# Patient Record
Sex: Female | Born: 1955
Health system: Southern US, Community
[De-identification: ages and names within clinical notes are randomized; demographics above are authoritative.]

## PROBLEM LIST (undated history)

## (undated) DIAGNOSIS — I10 Essential (primary) hypertension: Secondary | ICD-10-CM

## (undated) DIAGNOSIS — E119 Type 2 diabetes mellitus without complications: Secondary | ICD-10-CM

## (undated) DIAGNOSIS — C3432 Malignant neoplasm of lower lobe, left bronchus or lung: Secondary | ICD-10-CM

## (undated) DIAGNOSIS — F32A Depression, unspecified: Secondary | ICD-10-CM

## (undated) DIAGNOSIS — Z7902 Long term (current) use of antithrombotics/antiplatelets: Secondary | ICD-10-CM

## (undated) DIAGNOSIS — F329 Major depressive disorder, single episode, unspecified: Secondary | ICD-10-CM

## (undated) DIAGNOSIS — R0600 Dyspnea, unspecified: Secondary | ICD-10-CM

## (undated) DIAGNOSIS — J45909 Unspecified asthma, uncomplicated: Secondary | ICD-10-CM

## (undated) DIAGNOSIS — T8859XA Other complications of anesthesia, initial encounter: Secondary | ICD-10-CM

## (undated) DIAGNOSIS — M4802 Spinal stenosis, cervical region: Secondary | ICD-10-CM

## (undated) DIAGNOSIS — K760 Fatty (change of) liver, not elsewhere classified: Secondary | ICD-10-CM

## (undated) DIAGNOSIS — E785 Hyperlipidemia, unspecified: Secondary | ICD-10-CM

## (undated) DIAGNOSIS — M4722 Other spondylosis with radiculopathy, cervical region: Secondary | ICD-10-CM

## (undated) DIAGNOSIS — M48061 Spinal stenosis, lumbar region without neurogenic claudication: Secondary | ICD-10-CM

## (undated) DIAGNOSIS — K635 Polyp of colon: Secondary | ICD-10-CM

## (undated) DIAGNOSIS — Z9841 Cataract extraction status, right eye: Secondary | ICD-10-CM

## (undated) DIAGNOSIS — F419 Anxiety disorder, unspecified: Secondary | ICD-10-CM

## (undated) DIAGNOSIS — C7970 Secondary malignant neoplasm of unspecified adrenal gland: Secondary | ICD-10-CM

## (undated) DIAGNOSIS — I2510 Atherosclerotic heart disease of native coronary artery without angina pectoris: Secondary | ICD-10-CM

## (undated) DIAGNOSIS — I714 Abdominal aortic aneurysm, without rupture, unspecified: Secondary | ICD-10-CM

## (undated) DIAGNOSIS — J449 Chronic obstructive pulmonary disease, unspecified: Secondary | ICD-10-CM

## (undated) DIAGNOSIS — E538 Deficiency of other specified B group vitamins: Secondary | ICD-10-CM

## (undated) DIAGNOSIS — K219 Gastro-esophageal reflux disease without esophagitis: Secondary | ICD-10-CM

## (undated) DIAGNOSIS — M5135 Other intervertebral disc degeneration, thoracolumbar region: Secondary | ICD-10-CM

## (undated) DIAGNOSIS — Z7982 Long term (current) use of aspirin: Secondary | ICD-10-CM

## (undated) DIAGNOSIS — M5030 Other cervical disc degeneration, unspecified cervical region: Secondary | ICD-10-CM

## (undated) DIAGNOSIS — G8929 Other chronic pain: Secondary | ICD-10-CM

## (undated) DIAGNOSIS — E559 Vitamin D deficiency, unspecified: Secondary | ICD-10-CM

## (undated) DIAGNOSIS — N83299 Other ovarian cyst, unspecified side: Secondary | ICD-10-CM

## (undated) DIAGNOSIS — J439 Emphysema, unspecified: Secondary | ICD-10-CM

## (undated) DIAGNOSIS — M1990 Unspecified osteoarthritis, unspecified site: Secondary | ICD-10-CM

## (undated) DIAGNOSIS — C3490 Malignant neoplasm of unspecified part of unspecified bronchus or lung: Secondary | ICD-10-CM

## (undated) DIAGNOSIS — J432 Centrilobular emphysema: Secondary | ICD-10-CM

## (undated) DIAGNOSIS — E7800 Pure hypercholesterolemia, unspecified: Secondary | ICD-10-CM

## (undated) DIAGNOSIS — G4730 Sleep apnea, unspecified: Secondary | ICD-10-CM

## (undated) DIAGNOSIS — Z87442 Personal history of urinary calculi: Secondary | ICD-10-CM

## (undated) DIAGNOSIS — M47816 Spondylosis without myelopathy or radiculopathy, lumbar region: Secondary | ICD-10-CM

## (undated) DIAGNOSIS — I70213 Atherosclerosis of native arteries of extremities with intermittent claudication, bilateral legs: Secondary | ICD-10-CM

## (undated) DIAGNOSIS — Z9842 Cataract extraction status, left eye: Secondary | ICD-10-CM

## (undated) DIAGNOSIS — Z716 Tobacco abuse counseling: Secondary | ICD-10-CM

## (undated) DIAGNOSIS — C7492 Malignant neoplasm of unspecified part of left adrenal gland: Secondary | ICD-10-CM

## (undated) DIAGNOSIS — M810 Age-related osteoporosis without current pathological fracture: Secondary | ICD-10-CM

## (undated) DIAGNOSIS — J302 Other seasonal allergic rhinitis: Secondary | ICD-10-CM

## (undated) DIAGNOSIS — I6523 Occlusion and stenosis of bilateral carotid arteries: Secondary | ICD-10-CM

## (undated) DIAGNOSIS — M109 Gout, unspecified: Secondary | ICD-10-CM

## (undated) DIAGNOSIS — C801 Malignant (primary) neoplasm, unspecified: Secondary | ICD-10-CM

## (undated) DIAGNOSIS — I700 Atherosclerosis of aorta: Secondary | ICD-10-CM

## (undated) DIAGNOSIS — N9489 Other specified conditions associated with female genital organs and menstrual cycle: Secondary | ICD-10-CM

## (undated) DIAGNOSIS — T7840XA Allergy, unspecified, initial encounter: Secondary | ICD-10-CM

## (undated) HISTORY — DX: Major depressive disorder, single episode, unspecified: F32.9

## (undated) HISTORY — DX: Gastro-esophageal reflux disease without esophagitis: K21.9

## (undated) HISTORY — DX: Unspecified asthma, uncomplicated: J45.909

## (undated) HISTORY — PX: ABDOMINAL HYSTERECTOMY: SHX81

## (undated) HISTORY — DX: Depression, unspecified: F32.A

## (undated) HISTORY — DX: Hyperlipidemia, unspecified: E78.5

## (undated) HISTORY — PX: CARDIAC CATHETERIZATION: SHX172

## (undated) HISTORY — PX: VAGINAL HYSTERECTOMY: SUR661

## (undated) HISTORY — PX: CORONARY ANGIOPLASTY: SHX604

## (undated) HISTORY — PX: EXTRACORPOREAL SHOCK WAVE LITHOTRIPSY: SHX1557

## (undated) HISTORY — DX: Emphysema, unspecified: J43.9

## (undated) HISTORY — PX: OTHER SURGICAL HISTORY: SHX169

## (undated) HISTORY — DX: Type 2 diabetes mellitus without complications: E11.9

## (undated) HISTORY — PX: TUBAL LIGATION: SHX77

## (undated) HISTORY — DX: Allergy, unspecified, initial encounter: T78.40XA

## (undated) HISTORY — DX: Tobacco abuse counseling: Z71.6

## (undated) HISTORY — DX: Age-related osteoporosis without current pathological fracture: M81.0

## (undated) HISTORY — DX: Essential (primary) hypertension: I10

## (undated) HISTORY — DX: Unspecified osteoarthritis, unspecified site: M19.90

## (undated) SURGERY — Surgical Case
Anesthesia: *Unknown

---

## 1997-12-04 HISTORY — PX: KIDNEY STONE SURGERY: SHX686

## 1997-12-04 HISTORY — PX: EXTRACORPOREAL SHOCK WAVE LITHOTRIPSY: SHX1557

## 2006-08-25 ENCOUNTER — Emergency Department: Payer: Self-pay

## 2007-03-02 ENCOUNTER — Other Ambulatory Visit: Payer: Self-pay

## 2007-03-02 ENCOUNTER — Emergency Department: Payer: Self-pay | Admitting: General Practice

## 2007-10-09 ENCOUNTER — Emergency Department: Payer: Self-pay | Admitting: Internal Medicine

## 2007-10-25 DIAGNOSIS — J309 Allergic rhinitis, unspecified: Secondary | ICD-10-CM | POA: Insufficient documentation

## 2007-10-25 DIAGNOSIS — I10 Essential (primary) hypertension: Secondary | ICD-10-CM | POA: Insufficient documentation

## 2007-10-25 DIAGNOSIS — F17210 Nicotine dependence, cigarettes, uncomplicated: Secondary | ICD-10-CM | POA: Insufficient documentation

## 2007-10-25 DIAGNOSIS — M1990 Unspecified osteoarthritis, unspecified site: Secondary | ICD-10-CM | POA: Insufficient documentation

## 2007-10-25 DIAGNOSIS — J45909 Unspecified asthma, uncomplicated: Secondary | ICD-10-CM | POA: Insufficient documentation

## 2007-10-25 DIAGNOSIS — M9979 Connective tissue and disc stenosis of intervertebral foramina of abdomen and other regions: Secondary | ICD-10-CM | POA: Insufficient documentation

## 2007-12-25 ENCOUNTER — Ambulatory Visit: Payer: Self-pay | Admitting: Family Medicine

## 2008-01-15 ENCOUNTER — Ambulatory Visit: Payer: Self-pay | Admitting: Family Medicine

## 2008-03-23 ENCOUNTER — Emergency Department: Payer: Self-pay | Admitting: Emergency Medicine

## 2008-04-01 ENCOUNTER — Ambulatory Visit: Payer: Self-pay | Admitting: Family Medicine

## 2008-06-24 DIAGNOSIS — E7800 Pure hypercholesterolemia, unspecified: Secondary | ICD-10-CM | POA: Insufficient documentation

## 2008-06-27 ENCOUNTER — Other Ambulatory Visit: Payer: Self-pay

## 2008-06-27 ENCOUNTER — Emergency Department: Payer: Self-pay | Admitting: Emergency Medicine

## 2009-02-01 HISTORY — PX: OTHER SURGICAL HISTORY: SHX169

## 2009-02-01 HISTORY — PX: CORONARY ANGIOPLASTY: SHX604

## 2009-02-01 HISTORY — PX: CARDIAC CATHETERIZATION: SHX172

## 2009-02-05 DIAGNOSIS — E119 Type 2 diabetes mellitus without complications: Secondary | ICD-10-CM | POA: Insufficient documentation

## 2009-03-16 ENCOUNTER — Ambulatory Visit: Payer: Self-pay | Admitting: Cardiology

## 2009-03-16 DIAGNOSIS — I2510 Atherosclerotic heart disease of native coronary artery without angina pectoris: Secondary | ICD-10-CM

## 2009-03-16 HISTORY — DX: Atherosclerotic heart disease of native coronary artery without angina pectoris: I25.10

## 2009-03-16 HISTORY — PX: CORONARY ANGIOPLASTY WITH STENT PLACEMENT: SHX49

## 2009-03-17 DIAGNOSIS — I2510 Atherosclerotic heart disease of native coronary artery without angina pectoris: Secondary | ICD-10-CM | POA: Insufficient documentation

## 2009-05-07 ENCOUNTER — Ambulatory Visit: Payer: Self-pay | Admitting: Family Medicine

## 2010-01-25 ENCOUNTER — Ambulatory Visit: Payer: Self-pay | Admitting: Family Medicine

## 2011-03-28 HISTORY — PX: OTHER SURGICAL HISTORY: SHX169

## 2011-04-20 ENCOUNTER — Ambulatory Visit: Payer: Self-pay | Admitting: Family Medicine

## 2011-04-27 ENCOUNTER — Ambulatory Visit: Payer: Self-pay | Admitting: Family Medicine

## 2011-11-01 ENCOUNTER — Ambulatory Visit: Payer: Self-pay | Admitting: Family Medicine

## 2011-12-14 ENCOUNTER — Ambulatory Visit: Payer: Self-pay | Admitting: Family Medicine

## 2011-12-14 LAB — HM MAMMOGRAPHY

## 2012-03-14 ENCOUNTER — Ambulatory Visit: Payer: Self-pay | Admitting: Family Medicine

## 2012-03-14 LAB — HM PAP SMEAR: HM Pap smear: NEGATIVE

## 2012-08-26 ENCOUNTER — Emergency Department: Payer: Self-pay | Admitting: *Deleted

## 2012-08-26 LAB — URINALYSIS, COMPLETE
Bacteria: NONE SEEN
Bilirubin,UR: NEGATIVE
Blood: NEGATIVE
Glucose,UR: NEGATIVE mg/dL (ref 0–75)
Ketone: NEGATIVE
Leukocyte Esterase: NEGATIVE
Nitrite: NEGATIVE
Ph: 7 (ref 4.5–8.0)
Protein: NEGATIVE
RBC,UR: <1 /HPF (ref 0–5)
Specific Gravity: 1.002 (ref 1.003–1.030)
Squamous Epithelial: <1
WBC UR: NONE SEEN /HPF (ref 0–5)

## 2012-08-26 LAB — COMPREHENSIVE METABOLIC PANEL
Albumin: 4 g/dL (ref 3.4–5.0)
Alkaline Phosphatase: 130 U/L (ref 50–136)
Anion Gap: 9 (ref 7–16)
BUN: 16 mg/dL (ref 7–18)
Bilirubin,Total: 0.3 mg/dL (ref 0.2–1.0)
Calcium, Total: 9.5 mg/dL (ref 8.5–10.1)
Chloride: 108 mmol/L — ABNORMAL HIGH (ref 98–107)
Co2: 26 mmol/L (ref 21–32)
Creatinine: 0.78 mg/dL (ref 0.60–1.30)
EGFR (African American): >60
EGFR (Non-African Amer.): >60
Glucose: 90 mg/dL (ref 65–99)
Osmolality: 286 (ref 275–301)
Potassium: 4 mmol/L (ref 3.5–5.1)
SGOT(AST): 19 U/L (ref 15–37)
SGPT (ALT): 31 U/L (ref 12–78)
Sodium: 143 mmol/L (ref 136–145)
Total Protein: 7.5 g/dL (ref 6.4–8.2)

## 2012-08-26 LAB — CBC WITH DIFFERENTIAL/PLATELET
Basophil #: 0.1 10*3/uL (ref 0.0–0.1)
Basophil %: 0.6 %
Eosinophil #: 0.3 10*3/uL (ref 0.0–0.7)
Eosinophil %: 3.2 %
HCT: 46.7 % (ref 35.0–47.0)
HGB: 15.7 g/dL (ref 12.0–16.0)
Lymphocyte #: 3.9 10*3/uL — ABNORMAL HIGH (ref 1.0–3.6)
Lymphocyte %: 38.7 %
MCH: 29.7 pg (ref 26.0–34.0)
MCHC: 33.6 g/dL (ref 32.0–36.0)
MCV: 88 fL (ref 80–100)
Monocyte #: 0.8 x10 3/mm (ref 0.2–0.9)
Monocyte %: 7.6 %
Neutrophil #: 5.1 10*3/uL (ref 1.4–6.5)
Neutrophil %: 49.9 %
Platelet: 235 10*3/uL (ref 150–440)
RBC: 5.29 10*6/uL — ABNORMAL HIGH (ref 3.80–5.20)
RDW: 14.7 % — ABNORMAL HIGH (ref 11.5–14.5)
WBC: 10.1 10*3/uL (ref 3.6–11.0)

## 2012-08-26 LAB — LIPASE, BLOOD: Lipase: 140 U/L (ref 73–393)

## 2012-09-26 DIAGNOSIS — R5383 Other fatigue: Secondary | ICD-10-CM | POA: Insufficient documentation

## 2012-09-26 DIAGNOSIS — R5381 Other malaise: Secondary | ICD-10-CM | POA: Insufficient documentation

## 2012-10-04 ENCOUNTER — Ambulatory Visit: Payer: Self-pay | Admitting: Family Medicine

## 2012-11-06 ENCOUNTER — Ambulatory Visit: Payer: Self-pay | Admitting: Family Medicine

## 2013-01-28 ENCOUNTER — Ambulatory Visit: Payer: Self-pay | Admitting: Vascular Surgery

## 2013-01-28 LAB — CREATININE, SERUM
Creatinine: 0.69 mg/dL (ref 0.60–1.30)
EGFR (African American): >60
EGFR (Non-African Amer.): >60

## 2013-01-28 LAB — BUN: BUN: 15 mg/dL (ref 7–18)

## 2013-01-31 DIAGNOSIS — I350 Nonrheumatic aortic (valve) stenosis: Secondary | ICD-10-CM | POA: Insufficient documentation

## 2013-02-13 ENCOUNTER — Ambulatory Visit: Payer: Self-pay | Admitting: Family Medicine

## 2013-02-27 ENCOUNTER — Ambulatory Visit: Payer: Self-pay | Admitting: Vascular Surgery

## 2013-03-11 ENCOUNTER — Ambulatory Visit: Payer: Self-pay | Admitting: Vascular Surgery

## 2013-03-11 LAB — CBC
HCT: 45.9 % (ref 35.0–47.0)
HGB: 15.5 g/dL (ref 12.0–16.0)
MCH: 29.5 pg (ref 26.0–34.0)
MCHC: 33.8 g/dL (ref 32.0–36.0)
MCV: 87 fL (ref 80–100)
Platelet: 234 10*3/uL (ref 150–440)
RBC: 5.25 10*6/uL — ABNORMAL HIGH (ref 3.80–5.20)
RDW: 14 % (ref 11.5–14.5)
WBC: 8.9 10*3/uL (ref 3.6–11.0)

## 2013-03-11 LAB — BASIC METABOLIC PANEL
Anion Gap: 3 — ABNORMAL LOW (ref 7–16)
BUN: 15 mg/dL (ref 7–18)
Calcium, Total: 9.3 mg/dL (ref 8.5–10.1)
Chloride: 108 mmol/L — ABNORMAL HIGH (ref 98–107)
Co2: 29 mmol/L (ref 21–32)
Creatinine: 0.6 mg/dL (ref 0.60–1.30)
EGFR (African American): >60
EGFR (Non-African Amer.): >60
Glucose: 110 mg/dL — ABNORMAL HIGH (ref 65–99)
Osmolality: 281 (ref 275–301)
Potassium: 4.1 mmol/L (ref 3.5–5.1)
Sodium: 140 mmol/L (ref 136–145)

## 2013-03-19 ENCOUNTER — Ambulatory Visit: Payer: Self-pay | Admitting: Vascular Surgery

## 2013-03-27 ENCOUNTER — Inpatient Hospital Stay: Payer: Self-pay | Admitting: Vascular Surgery

## 2013-03-27 HISTORY — PX: ABDOMINAL AORTIC ENDOVASCULAR STENT GRAFT: SHX5707

## 2013-03-27 LAB — CREATININE, SERUM
Creatinine: 0.63 mg/dL (ref 0.60–1.30)
EGFR (African American): >60
EGFR (Non-African Amer.): >60

## 2013-03-27 LAB — GLUCOSE, RANDOM: Glucose: 105 mg/dL — ABNORMAL HIGH (ref 65–99)

## 2013-03-27 LAB — BUN: BUN: 12 mg/dL (ref 7–18)

## 2013-03-28 LAB — CBC WITH DIFFERENTIAL/PLATELET
Basophil #: 0 10*3/uL (ref 0.0–0.1)
Basophil %: 0.4 %
Eosinophil #: 0.2 10*3/uL (ref 0.0–0.7)
Eosinophil %: 1.6 %
HCT: 39 % (ref 35.0–47.0)
HGB: 13.2 g/dL (ref 12.0–16.0)
Lymphocyte #: 2 10*3/uL (ref 1.0–3.6)
Lymphocyte %: 19.5 %
MCH: 29.9 pg (ref 26.0–34.0)
MCHC: 33.8 g/dL (ref 32.0–36.0)
MCV: 88 fL (ref 80–100)
Monocyte #: 0.7 x10 3/mm (ref 0.2–0.9)
Monocyte %: 7 %
Neutrophil #: 7.2 10*3/uL — ABNORMAL HIGH (ref 1.4–6.5)
Neutrophil %: 71.5 %
Platelet: 173 10*3/uL (ref 150–440)
RBC: 4.4 10*6/uL (ref 3.80–5.20)
RDW: 13.9 % (ref 11.5–14.5)
WBC: 10.1 10*3/uL (ref 3.6–11.0)

## 2013-03-28 LAB — COMPREHENSIVE METABOLIC PANEL
Albumin: 3.1 g/dL — ABNORMAL LOW (ref 3.4–5.0)
Alkaline Phosphatase: 77 U/L (ref 50–136)
Anion Gap: 5 — ABNORMAL LOW (ref 7–16)
BUN: 8 mg/dL (ref 7–18)
Bilirubin,Total: 0.6 mg/dL (ref 0.2–1.0)
Calcium, Total: 8.2 mg/dL — ABNORMAL LOW (ref 8.5–10.1)
Chloride: 107 mmol/L (ref 98–107)
Co2: 27 mmol/L (ref 21–32)
Creatinine: 0.57 mg/dL — ABNORMAL LOW (ref 0.60–1.30)
EGFR (African American): >60
EGFR (Non-African Amer.): >60
Glucose: 112 mg/dL — ABNORMAL HIGH (ref 65–99)
Osmolality: 277 (ref 275–301)
Potassium: 3.2 mmol/L — ABNORMAL LOW (ref 3.5–5.1)
SGOT(AST): 16 U/L (ref 15–37)
SGPT (ALT): 19 U/L (ref 12–78)
Sodium: 139 mmol/L (ref 136–145)
Total Protein: 6 g/dL — ABNORMAL LOW (ref 6.4–8.2)

## 2013-03-28 LAB — MAGNESIUM
Magnesium: 1.5 mg/dL — ABNORMAL LOW
Magnesium: 2.1 mg/dL

## 2013-03-28 LAB — PROTIME-INR
INR: 1.1
Prothrombin Time: 14.3 secs (ref 11.5–14.7)

## 2013-03-28 LAB — PHOSPHORUS: Phosphorus: 3 mg/dL (ref 2.5–4.9)

## 2013-03-28 LAB — POTASSIUM: Potassium: 3.7 mmol/L (ref 3.5–5.1)

## 2013-03-28 LAB — APTT: Activated PTT: 28.2 secs (ref 23.6–35.9)

## 2013-04-07 ENCOUNTER — Ambulatory Visit: Payer: Self-pay | Admitting: Pain Medicine

## 2013-04-17 ENCOUNTER — Ambulatory Visit: Payer: Self-pay | Admitting: Pain Medicine

## 2013-04-17 ENCOUNTER — Ambulatory Visit: Payer: Self-pay | Admitting: Family Medicine

## 2013-05-04 ENCOUNTER — Ambulatory Visit: Payer: Self-pay | Admitting: Family Medicine

## 2013-06-11 ENCOUNTER — Ambulatory Visit: Payer: Self-pay | Admitting: Pain Medicine

## 2013-06-13 ENCOUNTER — Other Ambulatory Visit: Payer: Self-pay | Admitting: Pain Medicine

## 2013-06-19 ENCOUNTER — Ambulatory Visit: Payer: Self-pay | Admitting: Pain Medicine

## 2013-07-07 ENCOUNTER — Ambulatory Visit: Payer: Self-pay | Admitting: Pain Medicine

## 2013-07-25 ENCOUNTER — Ambulatory Visit: Payer: Self-pay | Admitting: Neurosurgery

## 2014-05-26 DIAGNOSIS — Z9889 Other specified postprocedural states: Secondary | ICD-10-CM | POA: Insufficient documentation

## 2014-05-26 DIAGNOSIS — Z8679 Personal history of other diseases of the circulatory system: Secondary | ICD-10-CM | POA: Insufficient documentation

## 2014-05-26 DIAGNOSIS — I714 Abdominal aortic aneurysm, without rupture, unspecified: Secondary | ICD-10-CM | POA: Insufficient documentation

## 2015-03-26 NOTE — Op Note (Signed)
PATIENT NAME:  Laura Nielsen, Laura Nielsen MR#:  [id number] DATE OF BIRTH:  [birth year]  DATE OF PROCEDURE:  03/27/2013  PREOPERATIVE DIAGNOSES:  1.  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  2.  Dilatation of the infrarenal aorta with a diameter of 24 mm.   POSTOPERATIVE DIAGNOSES:  1.  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  2.  Dilatation of the infrarenal aorta with a diameter of 24 mm.   PROCEDURES PERFORMED:  1.  Introduction of catheter, right groin approach, into aorta.  2.  Introduction of catheter, aorta, left groin approach.  3.  Repair of aortic stenosis using an Endologix endograft.  4.  Bilateral percutaneous closure using ProGlide device.   SURGEON:  Dr. Delana Meyer.  CO-SURGEON:  Dr. Lucky Cowboy.    ANESTHESIA: General by endotracheal intubation.   FLUIDS:  Per anesthesia record.   ESTIMATED BLOOD LOSS:  Minimal.   SPECIMEN:  None.   FLUOROSCOPY TIME:  13 minutes.   CONTRAST USED:  Isovue 100 mL.   INDICATIONS:  The patient is a 59 year old woman who presented to the office with increasing pain of a continuous nature of bilateral lower extremities. Physical examination, as well as noninvasive studies demonstrated profound atherosclerotic occlusive disease and a subsequent angiogram demonstrated a predominately aortic process with near total occlusion of the infrarenal aorta. The risks and benefits for using an endograft to repair this process were reviewed. All questions answered. The patient has agreed to proceed.   DESCRIPTION OF PROCEDURE: The patient was taken to special procedures and placed in the supine position. After adequate general anesthesia is induced and appropriate invasive monitors are placed, she is positioned supine and prepped from approximately the nipple line down to the knees.   Ultrasound is placed in a sterile sleeve. Ultrasound is utilized  secondary to lack of appropriate landmarks and to avoid vascular injury. Under direct ultrasound visualization, the right common femoral artery is identified. It is echolucent and compressible with mild pulsatility indicating patency. Image is recorded for the permanent record. Under direct ultrasound visualization, the micropuncture needle is used to access the anterior wall of the common femoral artery, microwire followed by micro sheath, J-wire followed by a 6-French sheath.   In a similar fashion, access is obtained by Dr. Lucky Cowboy on the left side. Again, using ultrasound verifying the femoral artery is patent and performing the puncture under direct visualization, image was recorded on the left as well.   Using a KMP catheter, the wire and catheter combination are used to negotiate through the high-grade stenosis, and then Lunderquist wire is advanced up the right side. Two Perclose devices were used prior to exchanging for the Lunderquist wire to perform a pre-close of the right groin. Single Perclose device was used on the left, and an 8-French sheath was subsequently inserted, and a marker pigtail catheter and J-wire are advanced up that side. Bolus injection of contrast was then performed, and final length measurements were made. On the right, over the Lunderquist wire, the 8-French sheath was then upsized to a 17-French AFX sheath, and a 22 x 80 x 40 main body device was opened and prepped on the field. A SurePass wire was then advanced through the sheath and snared by Dr. Lucky Cowboy and pulled out the left groin. The device was then advanced, opened and seated on the bifurcation. [facility] wire was then advanced through the contralateral limb hypotube, and subsequently a pigtail catheter  readvanced. Images of the renal arteries were then obtained. Length measurements were finalized and a 25 x 75 proximal extension was selected, prepped on the back table and advanced through the AFX sheath on the right. Again,  after a second magnified image, the proximal extension was deployed. A Coda balloon was then used to angioplasty the entire system with the exception of the left limb, which was angioplastied using a 10 x 4 balloon from the left sheath.   Pigtail catheter was then advanced up the right, and a final angiogram was obtained, which demonstrated rapid flow of contrast without any significant residual stenosis. Bilateral iliac and femorals are patent.   The AFX sheath was then removed, and the Perclose device was secured, light pressure was held, and a safeguard device was placed. On the left side, the Perclose device was secured. There was moderate residual bleeding, and a second device was advanced over the wire. This yielded excellent hemostasis. Again, a safeguard device was placed.   4-0 Monocryl was then used to close both skin punctures with a subcuticular and then Dermabond. The patient tolerated the procedure well. There were no immediate complications. Sponge and needle counts were correct, and she was taken to the recovery area in stable condition.     ____________________________ Katha Cabal, MD ggs:dmm D: 03/28/2013 14:24:00 ET T: 03/28/2013 20:47:03 ET JOB#: [id number]  cc: Katha Cabal, MD, <Dictator> Katha Cabal MD ELECTRONICALLY SIGNED 04/14/2013 13:48

## 2015-03-26 NOTE — Op Note (Signed)
PATIENT NAME:  Laura Nielsen, Laura Nielsen MR#:  [id number] DATE OF BIRTH:  04/01/1956  DATE OF PROCEDURE:  01/28/2013  PREOPERATIVE DIAGNOSIS: Atherosclerotic occlusive disease bilateral lower extremities with rest pain bilateral lower extremities.   POSTOPERATIVE DIAGNOSIS:  Atherosclerotic occlusive disease bilateral lower extremities with rest pain bilateral lower extremities.   PROCEDURES PERFORMED: Introduction catheter aorta with bilateral lower extremity runoff.   PROCEDURE PERFORMED BY: Katha Cabal, MD   SEDATION: IV Versed plus fentanyl. Continuous ECG, pulse oximetry and cardiopulmonary monitoring was performed throughout the entire procedure by the interventional radiology nurse. Total sedation time was 1 hour.   ACCESS: A 5-French sheath, right common femoral artery.   FLUOROSCOPY TIME: 2.3 minutes.   CONTRAST USED: Isovue 125 mL.   INDICATIONS: The patient  is a 59 year old woman who presented to the office with increasing pain of  bilateral lower extremities. Noninvasive studies demonstrated monophasic signals at the level of the femorals bilaterally with very poor distal perfusion and markedly abnormal ABIs. The risks and benefits for angiography with intervention were reviewed. All questions are answered. The patient agrees to proceed.   DESCRIPTION OF PROCEDURE: The patient is taken to special procedures and placed in the supine position. After adequate sedation is achieved, both groins are prepped and draped in a sterile fashion. Ultrasound is placed in a sterile sleeve, and the right common femoral artery is identified. It is echolucent and pulsatile indicating patency. Image is recorded for the permanent record, and 1% lidocaine is infiltrated in, and under direct visualization a Micropuncture needle is inserted into the anterior wall, a Microwire followed by a Microsheath, J-wire followed by a 5-French sheath and 5-French pigtail catheter. The pigtail catheter is then negotiated to  the level of T12, and AP projection of the aorta is obtained. A second slightly oblique view was then obtained in the area of the aorta just below the renals.  The pigtail catheter is then repositioned, and oblique views of the pelvis are obtained. A detector is then positioned AP, and distal runoff is obtained.   After review of the images, the catheter is removed over the wire, and a ProGlide device is deployed without difficulty. There are no immediate complications.   INTERPRETATION: Initial image of the aorta demonstrates that there is a very heavily calcified eccentric plaque approximately 1 cm below the level of the left renal. This appears to be high-grade stenosis. More distally, the aorta appears to have mild plaque particularly on the right side, but this does not appear to be hemodynamically significant. Both right and left common iliacs appear widely patent, as are the external and internal iliac arteries.   The right common femoral, profunda femoris, and superficial femoral as well as the popliteal are widely patent. There is mild diffuse disease. The trifurcation appears patent.   On the left, the common femoral, profunda femoris, superficial femoral and popliteal are all widely patent. There is diffuse disease, but it is not hemodynamically significant and the trifurcation is widely patent.   SUMMARY: The patient appears to have an isolated aortic stricture or stenosis almost analogous to a coarctation which is near the level of the renals. The aorta itself measured 13.5 to almost 14 mm in diameter; and, therefore, electing to stent this will require fairly precise measurements, and I do not believe the balloon expandable stents we have for use at this point in time are the best choice, therefore, I will not treat today. I do believe she needs to be treated in the  future as I believe this represents a significant problem and is likely responsible for a significant portion of her pain. I  will obtain a CT scan so that more accurate measurements and characterization of this plaque can be obtained in a similar fashion that we would address an  infrarenal aortic aneurysm. This was explained to the patient in detail as well as her daughter in the postoperative area.  ____________________________ Katha Cabal, MD ggs:cb D: 01/29/2013 08:25:31 ET T: 01/29/2013 09:36:23 ET JOB#: [id number] cc: Katha Cabal, MD, <Dictator> Meredith Leeds, NP Katha Cabal MD ELECTRONICALLY SIGNED 03/04/2013 17:14

## 2015-03-26 NOTE — Op Note (Signed)
PATIENT NAME:  CLAIR, BARDWELL MR#:  [id number] DATE OF BIRTH:  July 05, 1956  DATE OF PROCEDURE:  03/27/2013  PREOPERATIVE DIAGNOSES: 1.  Atherosclerotic peripheral vascular disease with ischemic rest pain in lower extremities.  2.  Aortic atherosclerosis.  3.  Coronary artery disease.  4.  Hyperlipidemia.  5.  Chronic obstructive pulmonary disease.  6.  Hypertension.  POSTOPERATIVE DIAGNOSES: 1.  Atherosclerotic peripheral vascular disease with ischemic rest pain in lower extremities.  2.  Aortic atherosclerosis.  3.  Coronary artery disease.  4.  Hyperlipidemia.  5.  Chronic obstructive pulmonary disease.  6.  Hypertension.  PROCEDURES: 1.  Ultrasound guidance for vascular access to bilateral femoral arteries, right by Dr. Delana Meyer, left by Dr. Lucky Cowboy.  2. Catheter placement aorta from left femoral approach by Dr. Lucky Cowboy. 3.  Catheter placement into left femoral artery from right femoral approach by Dr. Delana Meyer. 4.  Aortogram and iliofemoral arteriogram.  5.  Placement of Endologix Powerlink endoprosthesis, co-surgeons Wesson Stith and Schnier. 6.  Placement of an aortic extension cuff, co-surgeons Gabi Mcfate and Schnier.  ANESTHESIA:  General   ESTIMATED BLOOD LOSS:  Minimal.   INDICATION FOR PROCEDURE: This is a 59 year old white female with severe aortic atherosclerosis with an 80% and 90% stenosis in her infrarenal abdominal aorta.  She had disease that went down into aorta and iliac segments. The best way to treat this area in a safe fashion was to place a large covered stent graft in the area. Risks and benefits were discussed with the patient. Informed consent was obtained.   DESCRIPTION OF PROCEDURE: The patient is brought to the vascular and interventional radiology suite with help for anesthesia colleagues providing a general anesthetic. Her abdomen and groins were sterilely prepped and draped and a sterile surgical field was created.  Dr. Delana Meyer accessed the right femoral artery under direct  ultrasound guidance and I accessed the left femoral artery under direct ultrasound guidance. A permanent image was recorded, 8-French sheaths were placed bilaterally after Perclose closure techniques were used in each femoral artery. The patient was systemically heparinized. I placed a pigtail catheter up the left femoral artery and aortogram was performed. Dr. Delana Meyer then put a Stiff wire and took the delivery sheath up the right side. The main body, which was a 25 mm diameter main body with 16 mm limbs, was deployed. It was brought into the aorta. This was parked on the aortic bifurcation.  Dr. Delana Meyer advanced the contralateral wire, which I snared, and pulled out the left femoral sheath and we then deployed the main body seated on the aortic bifurcation to cover the remainder of the disease more proximally. An aortic extension cuff was then deployed which was 25 mm diameter aortic extension cuff to just below the level of the left renal artery which was lower. The junction points and seal zones were ironed out with the compliant balloon. I used a 10 mm balloon in the left iliac through an 8-French sheath. At the conclusion of the procedure, there was excellent flow through the endoprosthesis without any residual flow-limiting stenosis and we will elected to terminate the procedure. The sheaths were removed and 2 Percloses were deployed on each side with excellent hemostatic result. The skin incision was closed with a single 4-0 Monocryl. The patient was taken to the recovery room in stable condition having tolerated the procedure well.    ____________________________ Algernon Huxley, MD jsd:ce D: 03/27/2013 13:34:55 ET T: 03/27/2013 14:04:34 ET JOB#: [id number]  cc: Algernon Huxley, MD, <  Dictator> Algernon Huxley MD ELECTRONICALLY SIGNED 03/31/2013 9:29

## 2015-03-26 NOTE — Discharge Summary (Signed)
PATIENT NAME:  Laura Nielsen, Laura Nielsen MR#:  [id number] DATE OF BIRTH:  06-14-1956  DATE OF ADMISSION:  03/27/2013 DATE OF DISCHARGE:  03/29/2013  PROCEDURE PERFORMED WHILE IN THE HOSPITAL: Aortic stent graft repair.  PREOPERATIVE DIAGNOSES:  1.  Peripheral arterial disease with rest pain, bilateral lower extremities.  2.  Atherosclerosis of aorta with dilatation of aorta, 24 mm.  3.  Obesity.  4.  Hypertension.   BRIEF HISTORY: This is a 59 year old white female with severe aortoiliac atherosclerotic arterial disease causing rest pain in the lower extremities. There is also some dilatation of the aorta, which will be repaired with a stent graft repair to treat these lesions.   HOSPITAL COURSE: The patient taken to the operating room where the above-mentioned procedure was performed. For full details of that report, please see the dictated operative summary. The patient did well and was on dopamine for approximately 24 hours post procedure for some mildly reduced blood pressure. This gradually improved with hydration and diet. We were able to saline lock her, discontinue her Foley and A line on the evening of postoperative day #1. By postoperative day #2, she was tolerating a diet. Her access sites were clean, dry and intact. Her feet were warm with resolution of her pain and she was stable for discharge. She will be discharged to home accompanied by her family.   DIET: Regular.   ACTIVITY: As tolerated.   FOLLOWUP: She will return to office in 2 to 4 weeks with ABIs.   MEDICATIONS:  1.  Bisoprolol/HCTZ 10/6.5 daily.  2.  Ventolin inhaler as needed.  3.  Aspirin 81 mg daily.  4.  Lisinopril 10 mg daily. 5.  Lovastatin 40 mg daily.  6.  Plavix 75 mg daily.  7.  Ibuprofen as needed.  8.  Loratadine 10 mg daily.  9.  Percocet as needed.   She will call or contact us for any fevers, chills, access site problems or other issues.   ____________________________ Algernon Huxley,  MD jsd:aw D: 04/07/2013 09:59:17 ET T: 04/07/2013 10:18:42 ET JOB#: [id number]  cc: Algernon Huxley, MD, <Dictator> Algernon Huxley MD ELECTRONICALLY SIGNED 04/10/2013 15:51

## 2015-03-31 LAB — HEPATIC FUNCTION PANEL
ALT: 23 U/L (ref 7–35)
AST: 18 U/L (ref 13–35)

## 2015-03-31 LAB — LIPID PANEL
Cholesterol: 158 mg/dL (ref 0–200)
HDL: 29 mg/dL — AB (ref 35–70)
LDL Cholesterol: 101 mg/dL
Triglycerides: 139 mg/dL (ref 40–160)

## 2015-03-31 LAB — BASIC METABOLIC PANEL
BUN: 13 mg/dL (ref 4–21)
Creatinine: 0.8 mg/dL (ref 0.5–1.1)
Glucose: 123 mg/dL
Potassium: 4.6 mmol/L (ref 3.4–5.3)
Sodium: 142 mmol/L (ref 137–147)

## 2015-03-31 LAB — CBC AND DIFFERENTIAL
HCT: 48 % — AB (ref 36–46)
Hemoglobin: 16.5 g/dL — AB (ref 12.0–16.0)
Platelets: 266 10*3/uL (ref 150–399)
WBC: 11.1 10^3/mL

## 2015-03-31 LAB — TSH: TSH: 2.61 u[IU]/mL (ref 0.41–5.90)

## 2015-05-14 ENCOUNTER — Other Ambulatory Visit: Payer: Self-pay

## 2015-05-14 DIAGNOSIS — Z8742 Personal history of other diseases of the female genital tract: Secondary | ICD-10-CM | POA: Insufficient documentation

## 2015-05-14 DIAGNOSIS — J449 Chronic obstructive pulmonary disease, unspecified: Secondary | ICD-10-CM | POA: Insufficient documentation

## 2015-05-14 DIAGNOSIS — I739 Peripheral vascular disease, unspecified: Secondary | ICD-10-CM | POA: Insufficient documentation

## 2015-05-14 DIAGNOSIS — E538 Deficiency of other specified B group vitamins: Secondary | ICD-10-CM | POA: Insufficient documentation

## 2015-05-14 DIAGNOSIS — M153 Secondary multiple arthritis: Secondary | ICD-10-CM | POA: Insufficient documentation

## 2015-05-14 DIAGNOSIS — R32 Unspecified urinary incontinence: Secondary | ICD-10-CM | POA: Insufficient documentation

## 2015-05-14 DIAGNOSIS — I10 Essential (primary) hypertension: Secondary | ICD-10-CM

## 2015-05-14 DIAGNOSIS — F32A Depression, unspecified: Secondary | ICD-10-CM | POA: Insufficient documentation

## 2015-05-14 DIAGNOSIS — M1990 Unspecified osteoarthritis, unspecified site: Secondary | ICD-10-CM | POA: Insufficient documentation

## 2015-05-14 DIAGNOSIS — K219 Gastro-esophageal reflux disease without esophagitis: Secondary | ICD-10-CM | POA: Insufficient documentation

## 2015-05-14 DIAGNOSIS — R202 Paresthesia of skin: Secondary | ICD-10-CM | POA: Insufficient documentation

## 2015-05-14 DIAGNOSIS — E559 Vitamin D deficiency, unspecified: Secondary | ICD-10-CM | POA: Insufficient documentation

## 2015-05-14 DIAGNOSIS — F329 Major depressive disorder, single episode, unspecified: Secondary | ICD-10-CM | POA: Insufficient documentation

## 2015-05-14 DIAGNOSIS — J439 Emphysema, unspecified: Secondary | ICD-10-CM | POA: Insufficient documentation

## 2015-05-14 DIAGNOSIS — E7800 Pure hypercholesterolemia, unspecified: Secondary | ICD-10-CM

## 2015-05-14 DIAGNOSIS — G4733 Obstructive sleep apnea (adult) (pediatric): Secondary | ICD-10-CM | POA: Insufficient documentation

## 2015-05-14 DIAGNOSIS — F3341 Major depressive disorder, recurrent, in partial remission: Secondary | ICD-10-CM | POA: Insufficient documentation

## 2015-05-14 DIAGNOSIS — J441 Chronic obstructive pulmonary disease with (acute) exacerbation: Secondary | ICD-10-CM | POA: Insufficient documentation

## 2015-05-14 DIAGNOSIS — I6529 Occlusion and stenosis of unspecified carotid artery: Secondary | ICD-10-CM | POA: Insufficient documentation

## 2015-05-14 MED ORDER — BISOPROLOL-HYDROCHLOROTHIAZIDE 10-6.25 MG PO TABS: 1.0000 | ORAL_TABLET | Freq: Every day | ORAL | Status: DC

## 2015-05-14 MED ORDER — LOVASTATIN 40 MG PO TABS: ORAL_TABLET | ORAL | Status: DC

## 2015-07-13 ENCOUNTER — Other Ambulatory Visit: Payer: Self-pay | Admitting: Family Medicine

## 2015-07-13 ENCOUNTER — Other Ambulatory Visit: Payer: Self-pay

## 2015-07-13 DIAGNOSIS — R202 Paresthesia of skin: Secondary | ICD-10-CM

## 2015-07-13 DIAGNOSIS — I10 Essential (primary) hypertension: Secondary | ICD-10-CM

## 2015-07-13 MED ORDER — LISINOPRIL 10 MG PO TABS: 10.0000 mg | ORAL_TABLET | Freq: Every day | ORAL | Status: DC

## 2015-07-13 NOTE — Telephone Encounter (Signed)
Next ov 07/28/2015.

## 2015-07-28 ENCOUNTER — Encounter: Payer: Self-pay | Admitting: Family Medicine

## 2015-07-28 ENCOUNTER — Ambulatory Visit (INDEPENDENT_AMBULATORY_CARE_PROVIDER_SITE_OTHER): Payer: Federal, State, Local not specified - PPO | Admitting: Family Medicine

## 2015-07-28 VITALS — BP 122/80 | HR 68 | Temp 98.3°F | Resp 16 | Ht 68.5 in | Wt 268.0 lb

## 2015-07-28 DIAGNOSIS — J439 Emphysema, unspecified: Secondary | ICD-10-CM

## 2015-07-28 DIAGNOSIS — J441 Chronic obstructive pulmonary disease with (acute) exacerbation: Secondary | ICD-10-CM

## 2015-07-28 DIAGNOSIS — Z1231 Encounter for screening mammogram for malignant neoplasm of breast: Secondary | ICD-10-CM

## 2015-07-28 DIAGNOSIS — E7800 Pure hypercholesterolemia, unspecified: Secondary | ICD-10-CM

## 2015-07-28 DIAGNOSIS — I10 Essential (primary) hypertension: Secondary | ICD-10-CM

## 2015-07-28 DIAGNOSIS — E119 Type 2 diabetes mellitus without complications: Secondary | ICD-10-CM

## 2015-07-28 DIAGNOSIS — E538 Deficiency of other specified B group vitamins: Secondary | ICD-10-CM | POA: Diagnosis not present

## 2015-07-28 LAB — POCT GLYCOSYLATED HEMOGLOBIN (HGB A1C): Hemoglobin A1C: 6.5

## 2015-07-28 MED ORDER — PREDNISONE 10 MG PO TABS: ORAL_TABLET | ORAL | Status: DC

## 2015-07-28 MED ORDER — AZITHROMYCIN 250 MG PO TABS: ORAL_TABLET | ORAL | Status: DC

## 2015-07-28 NOTE — Progress Notes (Signed)
Subjective:    Patient ID: Laura Nielsen, female    DOB: [birth year], 59 y.o.   MRN: [id number]  Diabetes She presents for her follow-up diabetic visit. She has type 2 diabetes mellitus. Her disease course has been stable. Hypoglycemia symptoms include headaches (Chronic issue, becoming more frequent.) and tremors (Has happened 3 times in last two months.). Pertinent negatives for hypoglycemia include no dizziness, seizures or speech difficulty. (Pt is not sure if she is having blood sugar lows.  She has had three episodes of tremors, and "feeling very shaky inside" in the last two months.  ) Associated symptoms include chest pain (Happened one time in the last month. ) and polydipsia. Pertinent negatives for diabetes include no blurred vision, no fatigue, no foot paresthesias, no polyphagia, no polyuria, no visual change, no weakness and no weight loss. Symptoms are stable. Risk factors for coronary artery disease include diabetes mellitus, dyslipidemia and hypertension.  Hyperlipidemia This is a chronic problem. The problem is controlled. Recent lipid tests were reviewed and are normal. Associated symptoms include chest pain (Happened one time in the last month. ) and shortness of breath (Having to use her inhaler more often). Pertinent negatives include no myalgias. There are no compliance problems.  Risk factors for coronary artery disease include diabetes mellitus, dyslipidemia and hypertension.  Hypertension This is a chronic problem. The problem is unchanged. The problem is controlled. Associated symptoms include chest pain (Happened one time in the last month. ), headaches (Chronic issue, becoming more frequent.), malaise/fatigue, neck pain and shortness of breath (Having to use her inhaler more often). Pertinent negatives include no blurred vision or palpitations.  Ankle Pain  The incident occurred more than 1 week ago. There was no injury mechanism. The pain is present in the right ankle. The  quality of the pain is described as stabbing and aching. The pain has been intermittent since onset. Associated symptoms include a loss of motion. Pertinent negatives include no inability to bear weight, loss of sensation, muscle weakness, numbness or tingling. The symptoms are aggravated by movement. She has tried NSAIDs for the symptoms. The treatment provided no relief.  Breathing Problem She complains of difficulty breathing and shortness of breath (Having to use her inhaler more often). There is no cough or wheezing. This is a chronic problem. The problem has been gradually worsening. Associated symptoms include chest pain (Happened one time in the last month. ), headaches (Chronic issue, becoming more frequent.) and malaise/fatigue. Pertinent negatives include no appetite change, fever, myalgias or weight loss. Her symptoms are alleviated by steroid inhaler. Improvement on treatment: Inhaler does help, but pt reports having to use it more often. Her past medical history is significant for COPD. There is no history of asthma, bronchiectasis, bronchitis, emphysema or pneumonia.   Lab Results  Component Value Date   CHOL 158 03/31/2015   HDL 29* 03/31/2015   LDLCALC 101 03/31/2015   TRIG 139 03/31/2015   Lab Results  Component Value Date   HGBA1C 6.5 07/28/2015      Patient Active Problem List   Diagnosis Date Noted  . Arthritis 05/14/2015  . Carotid artery narrowing 05/14/2015  . Back pain, chronic 05/14/2015  . Claudication 05/14/2015  . CAFL (chronic airflow limitation) 05/14/2015  . Acute exacerbation of chronic obstructive airways disease 05/14/2015  . Clinical depression 05/14/2015  . Chronic obstructive pulmonary emphysema 05/14/2015  . Acid reflux 05/14/2015  . Absence of bladder continence 05/14/2015  . Pins and needles sensation 05/14/2015  .  Obstructive apnea 05/14/2015  . Adiposity 05/14/2015  . Abnormal Pap smear of vagina 05/14/2015  . Peripheral blood vessel  disorder 05/14/2015  . B12 deficiency 05/14/2015  . Avitaminosis D 05/14/2015  . AAA (abdominal aortic aneurysm) without rupture 05/26/2014  . Aortic heart valve narrowing 01/31/2013  . Malaise and fatigue 09/26/2012  . CAD in native artery 03/17/2009  . Diabetes mellitus, type 2 02/05/2009  . Hypercholesteremia 06/24/2008  . Allergic rhinitis 10/25/2007  . Airway hyperreactivity 10/25/2007  . Current tobacco use 10/25/2007  . Narrowing of intervertebral disc space 10/25/2007  . Benign essential HTN 10/25/2007  . Arthritis, degenerative 10/25/2007   Family History  Problem Relation Age of Onset  . Hypertension Mother   . Coronary artery disease Mother   . Heart attack Mother     acute  . Alcohol abuse Father   . Depression Father   . Hypertension Father   . Heart attack Father 49    acute  . Alcohol abuse Sister   . Hyperlipidemia Sister   . Hypertension Sister   . Cancer Sister 64  . Heart attack Sister     x's 2  . Coronary artery disease Sister 64    x's 2  . Breast cancer Sister    Social History   Social History  . Marital Status: Divorced    Spouse Name: N/A  . Number of Children: N/A  . Years of Education: H/S   Occupational History  . Disabled    Social History Main Topics  . Smoking status: Current Every Day Smoker -- 1.00 packs/day for 30 years    Types: Cigarettes  . Smokeless tobacco: Never Used  . Alcohol Use: Yes     Comment: Rarely  . Drug Use: No  . Sexual Activity: Not on file   Other Topics Concern  . Not on file   Social History Narrative   Past Surgical History  Procedure Laterality Date  . Vascular stent  03/28/2011    Dr. Delana Meyer, [facility]; Infrarenal  . Cardiac catheterization  3/210    70-80% stenosis RCA stent placed. started on Plavix  . Kidney stone surgery  1999  . Vaginal hysterectomy      Menometrorrhagia. Excessive bleeding  . Tubal ligation     Allergies  Allergen Reactions  . Omeprazole Nausea And Vomiting    Previous Medications   ALBUTEROL (PROVENTIL HFA) 108 (90 BASE) MCG/ACT INHALER    Inhale into the lungs.   ASPIRIN 81 MG TABLET       BISOPROLOL-HYDROCHLOROTHIAZIDE (ZIAC) 10-6.25 MG PER TABLET    Take 1 tablet by mouth daily.   CLOPIDOGREL (PLAVIX) 75 MG TABLET    Take by mouth.   CYANOCOBALAMIN (RA VITAMIN B-12 TR) 1000 MCG TBCR    Take by mouth.   FLUTICASONE (FLONASE ALLERGY RELIEF) 50 MCG/ACT NASAL SPRAY    Place into the nose.   GABAPENTIN (NEURONTIN) 300 MG CAPSULE    1 (one) Capsule Capsule, Oral, three times daily   IBUPROFEN 200 MG CAPS    Take by mouth.   LISINOPRIL (PRINIVIL,ZESTRIL) 10 MG TABLET    Take 1 tablet (10 mg total) by mouth daily.   LORATADINE (CLARITIN) 10 MG TABLET    Take by mouth.   LOVASTATIN (MEVACOR) 40 MG TABLET    Take 1 1/2 Tablets Daily.   OXYCODONE-ACETAMINOPHEN (PERCOCET) 5-325 MG PER TABLET    Take by mouth.   TRIAMCINOLONE CREAM (KENALOG) 0.1 %    TRIAMCINOLONE ACETONIDE, 0.1% (External  Cream)  1 Cream Cream apply to hands bid for 0 days  Quantity: 60;  Refills: 0   Ordered :12-May-2011  [name] ;  Started 12-May-2011 Active Comments: DX: 692.9   VITAMIN C (ASCORBIC ACID) 500 MG TABLET    Take by mouth.   VITAMIN D, ERGOCALCIFEROL, (DRISDOL) 50000 UNITS CAPS CAPSULE    Take by mouth.    Review of Systems  Constitutional: Positive for malaise/fatigue. Negative for fever, chills, weight loss, diaphoresis, activity change, appetite change, fatigue and unexpected weight change.  Eyes: Negative for blurred vision.  Respiratory: Positive for shortness of breath (Having to use her inhaler more often). Negative for apnea, cough, choking, chest tightness, wheezing and stridor.   Cardiovascular: Positive for chest pain (Happened one time in the last month. ) and leg swelling (Chronic issue). Negative for palpitations.  Gastrointestinal: Positive for abdominal pain (Especially when coughing, sneezing, and movement.). Negative for nausea, vomiting,  diarrhea, constipation, blood in stool, abdominal distention, anal bleeding and rectal pain.  Endocrine: Positive for cold intolerance and polydipsia. Negative for heat intolerance, polyphagia and polyuria.  Musculoskeletal: Positive for back pain (Chronic issue), arthralgias and neck pain. Negative for myalgias, joint swelling, gait problem and neck stiffness.  Neurological: Positive for tremors (Has happened 3 times in last two months.) and headaches (Chronic issue, becoming more frequent.). Negative for dizziness, tingling, seizures, syncope, facial asymmetry, speech difficulty, weakness, light-headedness and numbness.       Objective:   Physical Exam  Constitutional: She is oriented to person, place, and time. She appears well-developed and well-nourished.  Cardiovascular: Normal rate and regular rhythm.   Pulmonary/Chest: Effort normal and breath sounds normal.  Neurological: She is alert and oriented to person, place, and time.    BP 122/80 mmHg  Pulse 68  Temp(Src) 98.3 F (36.8 C) (Oral)  Resp 16  Ht 5' 8.5" (1.74 m)  Wt 268 lb (121.564 kg)  BMI 40.15 kg/m2       Assessment & Plan:  1. Benign essential HTN Stable. Continue current medication and plan of care.   2. B12 deficiency Will check labs and address as needed.  - CBC with Differential/Platelet - Vitamin B12  3. Type 2 diabetes mellitus without complication Stable. Continue current plan of care, except sounds like having some lows. Recommend monitor more closely, make sure to eat regularly and not have high carb meals.  Recheck in 3 months.  Call sooner if worsening hypoglycemia.   - POCT glycosylated hemoglobin (Hb A1C) Results for orders placed or performed in visit on 07/28/15  POCT glycosylated hemoglobin (Hb A1C)  Result Value Ref Range   Hemoglobin A1C 6.5     4. Hypercholesteremia Condition is stable. Please continue current medication and  plan of care as noted. ,   5. Encounter for screening  mammogram for breast cancer Ordered today.  - MM Digital Screening  6. Pulmonary emphysema, unspecified emphysema type Worsening.  - Spirometry with graph  7. COPD with exacerbation Unclear if exacerbation or new baseline. Will treat with Prednisone taper, and Zpak and recheck in 2 weeks. May need daily inhaler.  Recheck spirometry at follow up.    Margarita Rana, MD

## 2015-07-31 LAB — CBC WITH DIFFERENTIAL/PLATELET
Basophils Absolute: 0 10*3/uL (ref 0.0–0.2)
Basos: 0 %
EOS (ABSOLUTE): 0 10*3/uL (ref 0.0–0.4)
Eos: 0 %
Hematocrit: 44.9 % (ref 34.0–46.6)
Hemoglobin: 15.6 g/dL (ref 11.1–15.9)
Immature Grans (Abs): 0 10*3/uL (ref 0.0–0.1)
Immature Granulocytes: 0 %
Lymphocytes Absolute: 3.8 10*3/uL — ABNORMAL HIGH (ref 0.7–3.1)
Lymphs: 25 %
MCH: 29.3 pg (ref 26.6–33.0)
MCHC: 34.7 g/dL (ref 31.5–35.7)
MCV: 84 fL (ref 79–97)
Monocytes Absolute: 0.8 10*3/uL (ref 0.1–0.9)
Monocytes: 5 %
Neutrophils Absolute: 10.8 10*3/uL — ABNORMAL HIGH (ref 1.4–7.0)
Neutrophils: 70 %
Platelets: 241 10*3/uL (ref 150–379)
RBC: 5.32 x10E6/uL — ABNORMAL HIGH (ref 3.77–5.28)
RDW: 14.3 % (ref 12.3–15.4)
WBC: 15.5 10*3/uL — ABNORMAL HIGH (ref 3.4–10.8)

## 2015-07-31 LAB — VITAMIN B12: Vitamin B-12: 289 pg/mL (ref 211–946)

## 2015-08-02 ENCOUNTER — Telehealth: Payer: Self-pay

## 2015-08-02 DIAGNOSIS — J441 Chronic obstructive pulmonary disease with (acute) exacerbation: Secondary | ICD-10-CM

## 2015-08-02 NOTE — Telephone Encounter (Signed)
Patient advised as below, will increase B12 2,000 mcg daily. Patient will go for chest x-ray Wednesday or Thursday. sd

## 2015-08-02 NOTE — Telephone Encounter (Signed)
Ok to increase to 2000 mcg daily and recommend CXR, may need stronger antibiotic secondary to elevated white count. Thanks.

## 2015-08-02 NOTE — Telephone Encounter (Signed)
-----   Message from Margarita Rana, MD sent at 08/01/2015  1:10 PM EDT ----- White blood cell is elevated.  B12 is mildly low. Would recommend B12 supplements 1000 mcg daily. Also see how her breathing is, will need antibiotic is breathing not improved. Thanks.

## 2015-08-02 NOTE — Telephone Encounter (Signed)
Pt advised.  She reports she is already taking B12 1,000 mcg a day, would you like for her to increase it to 2,000 mcg?  Also she says her breathing has not improved (But is not any worse).  She just finished her Zpak yesterday and is still taking her prednisone taper.  (She is on day six)   Thanks,   -Mickel Baas

## 2015-08-04 ENCOUNTER — Ambulatory Visit
Admission: RE | Admit: 2015-08-04 | Discharge: 2015-08-04 | Disposition: A | Discharge status: Home / Self Care | Payer: Federal, State, Local not specified - PPO | Source: Ambulatory Visit | Attending: Family Medicine | Admitting: Family Medicine

## 2015-08-04 ENCOUNTER — Telehealth: Payer: Self-pay

## 2015-08-04 DIAGNOSIS — J441 Chronic obstructive pulmonary disease with (acute) exacerbation: Secondary | ICD-10-CM | POA: Diagnosis not present

## 2015-08-04 NOTE — Telephone Encounter (Signed)
-----   Message from Margarita Rana, MD sent at 08/04/2015 11:50 AM EDT ----- Bronchitis, but not pneumonia. Please notify patient.  Thanks.

## 2015-08-04 NOTE — Telephone Encounter (Signed)
Informed pt of results. Pt verbally acknowledges understanding. Renaldo Fiddler, CMA

## 2015-08-13 ENCOUNTER — Ambulatory Visit: Payer: Federal, State, Local not specified - PPO | Admitting: Family Medicine

## 2015-08-13 ENCOUNTER — Other Ambulatory Visit: Payer: Self-pay

## 2015-08-13 DIAGNOSIS — I350 Nonrheumatic aortic (valve) stenosis: Secondary | ICD-10-CM

## 2015-08-13 DIAGNOSIS — R202 Paresthesia of skin: Secondary | ICD-10-CM

## 2015-08-13 MED ORDER — CLOPIDOGREL BISULFATE 75 MG PO TABS: 75.0000 mg | ORAL_TABLET | Freq: Every day | ORAL | Status: DC

## 2015-08-13 MED ORDER — GABAPENTIN 300 MG PO CAPS: 300.0000 mg | ORAL_CAPSULE | Freq: Three times a day (TID) | ORAL | Status: DC

## 2015-08-13 NOTE — Telephone Encounter (Signed)
Last ov was on 03/20/2015.  Thanks,

## 2015-08-18 ENCOUNTER — Ambulatory Visit (INDEPENDENT_AMBULATORY_CARE_PROVIDER_SITE_OTHER): Payer: Federal, State, Local not specified - PPO | Admitting: Family Medicine

## 2015-08-18 ENCOUNTER — Encounter: Payer: Self-pay | Admitting: Family Medicine

## 2015-08-18 VITALS — BP 114/78 | HR 84 | Temp 98.1°F | Resp 16 | Ht 68.0 in | Wt 268.0 lb

## 2015-08-18 DIAGNOSIS — Z720 Tobacco use: Secondary | ICD-10-CM | POA: Diagnosis not present

## 2015-08-18 DIAGNOSIS — J438 Other emphysema: Secondary | ICD-10-CM

## 2015-08-18 NOTE — Progress Notes (Signed)
Patient ID: Laura Nielsen, female   DOB: 06/27/1956, 59 y.o.   MRN: [id number]        Patient: Laura Nielsen Female    DOB: 1956-10-15   59 y.o.   MRN: [id number] Visit Date: 08/18/2015  Today's Provider: Margarita Rana, MD   Chief Complaint  Patient presents with  . COPD   Subjective:    HPI  COPD, Follow up:  She was last seen for this 2 weeks ago. Changes made include adding Azithromycin and Prednisone taper for 6 days.   She reports excellent compliance with treatment. She is not having side effects.  she is/isnot using rescue inhaler. Typically 2 per days. She IS experiencing cough. Non-productive.  She is NOT experiencing fever. she report breathing is Worse. Patient reports that she was doing well after antibiotic, but reports cough and wheezing for a while.   Does feel that it is related to the time of year.    August and September are her worst months and then again in the Spring.   CXR showed bronchitis.   ------------------------------------------------------------------------       Allergies  Allergen Reactions  . Omeprazole Nausea And Vomiting   Previous Medications   ALBUTEROL (PROVENTIL HFA) 108 (90 BASE) MCG/ACT INHALER    Inhale into the lungs.   ASPIRIN 81 MG TABLET    Take 81 mg by mouth daily.    BISOPROLOL-HYDROCHLOROTHIAZIDE (ZIAC) 10-6.25 MG PER TABLET    Take 1 tablet by mouth daily.   CHOLECALCIFEROL (VITAMIN D) 1000 UNITS TABLET    Take 1,000 Units by mouth daily.   CLOPIDOGREL (PLAVIX) 75 MG TABLET    Take 1 tablet (75 mg total) by mouth daily.   CYANOCOBALAMIN (RA VITAMIN B-12 TR) 1000 MCG TBCR    Take 2 tablets by mouth daily.    GABAPENTIN (NEURONTIN) 300 MG CAPSULE    Take 1 capsule (300 mg total) by mouth 3 (three) times daily.   IBUPROFEN 200 MG CAPS    Take by mouth.    LISINOPRIL (PRINIVIL,ZESTRIL) 10 MG TABLET    Take 1 tablet (10 mg total) by mouth daily.   LORATADINE (CLARITIN) 10 MG TABLET    Take 10 mg by mouth daily as needed.      LOVASTATIN (MEVACOR) 40 MG TABLET    Take 1 1/2 Tablets Daily.   OXYCODONE-ACETAMINOPHEN (PERCOCET) 5-325 MG PER TABLET    Take 1 tablet by mouth every 8 (eight) hours as needed.    TRIAMCINOLONE CREAM (KENALOG) 0.1 %    TRIAMCINOLONE ACETONIDE, 0.1% (External Cream)  1 Cream Cream apply to hands bid for 0 days  Quantity: 60;  Refills: 0   Ordered :12-May-2011  [name] ;  Started 12-May-2011 Active Comments: DX: 692.9   VITAMIN C (ASCORBIC ACID) 500 MG TABLET    Take 500 mg by mouth.     Review of Systems  Constitutional: Negative.   HENT: Negative for postnasal drip, rhinorrhea, sneezing and sore throat.   Respiratory: Positive for cough, shortness of breath and wheezing.   Cardiovascular: Negative.     Social History  Substance Use Topics  . Smoking status: Current Every Day Smoker -- 1.00 packs/day for 30 years    Types: Cigarettes  . Smokeless tobacco: Never Used  . Alcohol Use: Yes     Comment: Rarely   Objective:   BP 114/78 mmHg  Pulse 84  Temp(Src) 98.1 F (36.7 C) (Oral)  Resp 16  Ht '5\' 8"'$  (1.727 m)  Wt  268 lb (121.564 kg)  BMI 40.76 kg/m2  SpO2 96%  Physical Exam  Constitutional: She is oriented to person, place, and time. She appears well-developed and well-nourished.  Cardiovascular: Normal rate and regular rhythm.   Pulmonary/Chest: Effort normal.  Neurological: She is alert and oriented to person, place, and time.       Assessment & Plan:     1. Other emphysema Stable today. Continue current plan of care.   2. Current tobacco use Stressed importance of cessation. Did talk about strategies, but not ready to quit.   Margarita Rana, MD       Margarita Rana, MD  [facility]

## 2015-08-23 ENCOUNTER — Telehealth: Payer: Self-pay | Admitting: Family Medicine

## 2015-08-23 NOTE — Telephone Encounter (Signed)
Pt needs a letter excusing her from jury duty on 09/14/15 due to her back issues and her medication side effects. Pt would like a call back letting her know if this can be done and if so, when will it be ready. Thanks TNP

## 2015-08-24 ENCOUNTER — Encounter: Payer: Self-pay | Admitting: Family Medicine

## 2015-08-24 NOTE — Telephone Encounter (Signed)
Letter written. Please notify patient. Thanks.

## 2015-09-07 ENCOUNTER — Ambulatory Visit
Admission: RE | Admit: 2015-09-07 | Discharge: 2015-09-07 | Disposition: A | Discharge status: Home / Self Care | Payer: Federal, State, Local not specified - PPO | Source: Ambulatory Visit | Attending: Family Medicine | Admitting: Family Medicine

## 2015-09-07 DIAGNOSIS — Z1231 Encounter for screening mammogram for malignant neoplasm of breast: Secondary | ICD-10-CM | POA: Insufficient documentation

## 2015-11-17 ENCOUNTER — Ambulatory Visit
Admission: RE | Admit: 2015-11-17 | Discharge: 2015-11-17 | Disposition: A | Discharge status: Home / Self Care | Payer: Federal, State, Local not specified - PPO | Source: Ambulatory Visit | Attending: Family Medicine | Admitting: Family Medicine

## 2015-11-17 ENCOUNTER — Ambulatory Visit (INDEPENDENT_AMBULATORY_CARE_PROVIDER_SITE_OTHER): Payer: Federal, State, Local not specified - PPO | Admitting: Family Medicine

## 2015-11-17 ENCOUNTER — Telehealth: Payer: Self-pay

## 2015-11-17 ENCOUNTER — Encounter: Payer: Self-pay | Admitting: Family Medicine

## 2015-11-17 VITALS — BP 124/76 | HR 72 | Temp 98.5°F | Resp 16 | Wt 269.0 lb

## 2015-11-17 DIAGNOSIS — Z720 Tobacco use: Secondary | ICD-10-CM | POA: Diagnosis not present

## 2015-11-17 DIAGNOSIS — M25571 Pain in right ankle and joints of right foot: Secondary | ICD-10-CM | POA: Insufficient documentation

## 2015-11-17 DIAGNOSIS — I10 Essential (primary) hypertension: Secondary | ICD-10-CM | POA: Diagnosis not present

## 2015-11-17 DIAGNOSIS — E7800 Pure hypercholesterolemia, unspecified: Secondary | ICD-10-CM | POA: Diagnosis not present

## 2015-11-17 DIAGNOSIS — M1990 Unspecified osteoarthritis, unspecified site: Secondary | ICD-10-CM

## 2015-11-17 DIAGNOSIS — I2510 Atherosclerotic heart disease of native coronary artery without angina pectoris: Secondary | ICD-10-CM | POA: Diagnosis not present

## 2015-11-17 DIAGNOSIS — M19071 Primary osteoarthritis, right ankle and foot: Secondary | ICD-10-CM | POA: Insufficient documentation

## 2015-11-17 DIAGNOSIS — E119 Type 2 diabetes mellitus without complications: Secondary | ICD-10-CM

## 2015-11-17 DIAGNOSIS — M25579 Pain in unspecified ankle and joints of unspecified foot: Secondary | ICD-10-CM | POA: Insufficient documentation

## 2015-11-17 LAB — POCT GLYCOSYLATED HEMOGLOBIN (HGB A1C): Hemoglobin A1C: 6.2

## 2015-11-17 NOTE — Telephone Encounter (Signed)
Patient advised as directed below. 

## 2015-11-17 NOTE — Telephone Encounter (Signed)
-----   Message from Margarita Rana, MD sent at 11/17/2015 10:32 AM EST ----- Arthritis in joint. May be part of cause of pain in ankle. Will await labs. Thanks.

## 2015-11-17 NOTE — Progress Notes (Signed)
Patient ID: Laura Nielsen, female   DOB: February 12, 1956, 59 y.o.   MRN: [id number]         Patient: Laura Nielsen Female    DOB: Aug 26, 1956   59 y.o.   MRN: [id number] Visit Date: 11/17/2015  Today's Provider: Margarita Rana, MD   Chief Complaint  Patient presents with  . Diabetes  . Hypertension  . Ankle Pain   Subjective:    Diabetes She presents for her follow-up diabetic visit. She has type 2 diabetes mellitus. Her disease course has been stable. There are no hypoglycemic associated symptoms. Pertinent negatives for diabetes include no blurred vision, no chest pain, no foot paresthesias, no foot ulcerations, no polydipsia, no polyphagia, no polyuria, no visual change, no weakness and no weight loss. Symptoms are stable. Risk factors for coronary artery disease include dyslipidemia, diabetes mellitus, hypertension and obesity. When asked about current treatments, none were reported. She is following a generally healthy diet. She does not see a podiatrist.Eye exam is not current.  Hypertension This is a chronic problem. The current episode started more than 1 year ago. The problem is controlled. Pertinent negatives include no blurred vision, chest pain, neck pain or shortness of breath. Risk factors for coronary artery disease include diabetes mellitus and dyslipidemia. The current treatment provides moderate improvement. There are no compliance problems.   Hyperlipidemia This is a chronic problem. The current episode started more than 1 year ago. The problem is controlled. Recent lipid tests were reviewed and are normal. Exacerbating diseases include diabetes and obesity. Pertinent negatives include no chest pain, focal weakness, leg pain, myalgias or shortness of breath. There are no compliance problems.  Risk factors for coronary artery disease include diabetes mellitus, dyslipidemia, hypertension and obesity.  Ankle Pain  The incident occurred more than 1 week ago. There was no injury  mechanism. The pain is present in the right ankle. The quality of the pain is described as aching, burning and shooting (Swells at times. Sometimes even a cover bohers it.  ). The pain is moderate. The pain has been worsening since onset. Pertinent negatives include no muscle weakness, numbness or tingling. She reports no foreign bodies present. The symptoms are aggravated by movement, palpation and weight bearing. She has tried acetaminophen and NSAIDs for the symptoms. The treatment provided mild (Has been bothering her for quite awhile. Getting worse. Waxes and  wanes.  ) relief.   Lab Results  Component Value Date   CHOL 158 03/31/2015   HDL 29* 03/31/2015   LDLCALC 101 03/31/2015   TRIG 139 03/31/2015   Lab Results  Component Value Date   HGBA1C 6.5 07/28/2015        Allergies  Allergen Reactions  . Omeprazole Nausea And Vomiting   Previous Medications   ALBUTEROL (PROVENTIL HFA) 108 (90 BASE) MCG/ACT INHALER    Inhale into the lungs.   ASPIRIN 81 MG TABLET    Take 81 mg by mouth daily.    BISOPROLOL-HYDROCHLOROTHIAZIDE (ZIAC) 10-6.25 MG PER TABLET    Take 1 tablet by mouth daily.   CHOLECALCIFEROL (VITAMIN D) 1000 UNITS TABLET    Take 1,000 Units by mouth daily.   CLOPIDOGREL (PLAVIX) 75 MG TABLET    Take 1 tablet (75 mg total) by mouth daily.   CYANOCOBALAMIN (RA VITAMIN B-12 TR) 1000 MCG TBCR    Take 2 tablets by mouth daily.    GABAPENTIN (NEURONTIN) 300 MG CAPSULE    Take 1 capsule (300 mg total) by mouth 3 (three) times  daily.   IBUPROFEN 200 MG CAPS    Take by mouth.    LISINOPRIL (PRINIVIL,ZESTRIL) 10 MG TABLET    Take 1 tablet (10 mg total) by mouth daily.   LORATADINE (CLARITIN) 10 MG TABLET    Take 10 mg by mouth daily as needed.    LOVASTATIN (MEVACOR) 40 MG TABLET    Take 1 1/2 Tablets Daily.   OXYCODONE-ACETAMINOPHEN (PERCOCET) 5-325 MG PER TABLET    Take 1 tablet by mouth every 8 (eight) hours as needed.    TRIAMCINOLONE CREAM (KENALOG) 0.1 %    TRIAMCINOLONE  ACETONIDE, 0.1% (External Cream)  1 Cream Cream apply to hands bid for 0 days  Quantity: 60;  Refills: 0   Ordered :12-May-2011  [name] ;  Started 12-May-2011 Active Comments: DX: 692.9   VITAMIN C (ASCORBIC ACID) 500 MG TABLET    Take 500 mg by mouth.     Review of Systems  Constitutional: Negative for weight loss.  Eyes: Negative for blurred vision.  Respiratory: Negative for shortness of breath.   Cardiovascular: Negative for chest pain.  Endocrine: Negative.  Negative for polydipsia, polyphagia and polyuria.  Musculoskeletal: Positive for back pain (Chronic issue), joint swelling and arthralgias. Negative for myalgias, neck pain and neck stiffness.       Chronic right ankle pain, seems to be worsening.   Neurological: Negative for tingling, focal weakness, weakness and numbness.    Social History  Substance Use Topics  . Smoking status: Current Every Day Smoker -- 1.00 packs/day for 30 years    Types: Cigarettes  . Smokeless tobacco: Never Used  . Alcohol Use: Yes     Comment: Rarely   Objective:   BP 124/76 mmHg  Pulse 72  Temp(Src) 98.5 F (36.9 C) (Oral)  Resp 16  Wt 269 lb (122.018 kg)  Physical Exam  Constitutional: She is oriented to person, place, and time. She appears well-developed and well-nourished.  Cardiovascular: Normal rate and regular rhythm.   Pulmonary/Chest: Effort normal and breath sounds normal.  Musculoskeletal: Normal range of motion.  Tender below right  lateral malleoli. No erythema noted. Swelling noted.     Neurological: She is alert and oriented to person, place, and time.  Psychiatric: She has a normal mood and affect. Her behavior is normal. Judgment and thought content normal.        Assessment & Plan:     1. Benign essential HTN Condition is stable. Please continue current medication and  plan of care as noted.    2. Type 2 diabetes mellitus without complication, without long-term current use of insulin (HCC) Improved.  Continue current medication and plan of care.   - POCT glycosylated hemoglobin (Hb A1C) - CBC with Differential/Platelet - Comprehensive Metabolic Panel (CMET) Results for orders placed or performed in visit on 11/17/15  POCT glycosylated hemoglobin (Hb A1C)  Result Value Ref Range   Hemoglobin A1C 6.2     3. Hypercholesteremia Condition is stable. Please continue current medication and  plan of care as noted.  Labs stable.   4. Right ankle pain Evaluate for possible gout.   - Sed Rate (ESR) - Uric acid  5. CAD in native artery Stable.   6. Osteoarthritis, unspecified osteoarthritis type, unspecified site Check Xray.   7. Ankle pain, right Will check xray and check labs. Concerning for gout.  - DG Ankle Complete Right; Future  8. Current tobacco use Encouraged patient to quit smoking.  Margarita Rana, MD  [facility]

## 2015-11-18 ENCOUNTER — Telehealth: Payer: Self-pay

## 2015-11-18 DIAGNOSIS — M25571 Pain in right ankle and joints of right foot: Secondary | ICD-10-CM

## 2015-11-18 LAB — CBC WITH DIFFERENTIAL/PLATELET
Basophils Absolute: 0 10*3/uL (ref 0.0–0.2)
Basos: 0 %
EOS (ABSOLUTE): 0.3 10*3/uL (ref 0.0–0.4)
Eos: 3 %
Hematocrit: 46.6 % (ref 34.0–46.6)
Hemoglobin: 16.4 g/dL — ABNORMAL HIGH (ref 11.1–15.9)
Immature Grans (Abs): 0 10*3/uL (ref 0.0–0.1)
Immature Granulocytes: 0 %
Lymphocytes Absolute: 3.8 10*3/uL — ABNORMAL HIGH (ref 0.7–3.1)
Lymphs: 37 %
MCH: 30.4 pg (ref 26.6–33.0)
MCHC: 35.2 g/dL (ref 31.5–35.7)
MCV: 86 fL (ref 79–97)
Monocytes Absolute: 0.5 10*3/uL (ref 0.1–0.9)
Monocytes: 5 %
Neutrophils Absolute: 5.7 10*3/uL (ref 1.4–7.0)
Neutrophils: 55 %
Platelets: 268 10*3/uL (ref 150–379)
RBC: 5.4 x10E6/uL — ABNORMAL HIGH (ref 3.77–5.28)
RDW: 14.6 % (ref 12.3–15.4)
WBC: 10.4 10*3/uL (ref 3.4–10.8)

## 2015-11-18 LAB — COMPREHENSIVE METABOLIC PANEL
ALT: 18 IU/L (ref 0–32)
AST: 14 IU/L (ref 0–40)
Albumin/Globulin Ratio: 1.8 (ref 1.1–2.5)
Albumin: 4.4 g/dL (ref 3.5–5.5)
Alkaline Phosphatase: 105 IU/L (ref 39–117)
BUN/Creatinine Ratio: 22 (ref 9–23)
BUN: 17 mg/dL (ref 6–24)
Bilirubin Total: 0.5 mg/dL (ref 0.0–1.2)
CO2: 21 mmol/L (ref 18–29)
Calcium: 10.1 mg/dL (ref 8.7–10.2)
Chloride: 104 mmol/L (ref 96–106)
Creatinine, Ser: 0.77 mg/dL (ref 0.57–1.00)
GFR calc Af Amer: 98 mL/min/{1.73_m2} (ref >59)
GFR calc non Af Amer: 85 mL/min/{1.73_m2} (ref >59)
Globulin, Total: 2.4 g/dL (ref 1.5–4.5)
Glucose: 128 mg/dL — ABNORMAL HIGH (ref 65–99)
Potassium: 4.8 mmol/L (ref 3.5–5.2)
Sodium: 143 mmol/L (ref 134–144)
Total Protein: 6.8 g/dL (ref 6.0–8.5)

## 2015-11-18 LAB — URIC ACID: Uric Acid: 7 mg/dL (ref 2.5–7.1)

## 2015-11-18 LAB — SEDIMENTATION RATE: Sed Rate: 5 mm/hr (ref 0–40)

## 2015-11-18 NOTE — Telephone Encounter (Signed)
Advised patient as below. Order placed for podiatry. Please call patient when scheduled. Thanks!

## 2015-11-18 NOTE — Telephone Encounter (Signed)
-----   Message from Margarita Rana, MD sent at 11/18/2015  2:59 PM EST ----- Labs stable. Unclear if ankle if arthritis or gout based on labs. Recommend podiatry referral to address further. Thanks.

## 2015-12-10 ENCOUNTER — Other Ambulatory Visit: Payer: Self-pay | Admitting: Family Medicine

## 2015-12-10 DIAGNOSIS — E7800 Pure hypercholesterolemia, unspecified: Secondary | ICD-10-CM

## 2015-12-10 DIAGNOSIS — I10 Essential (primary) hypertension: Secondary | ICD-10-CM

## 2015-12-10 DIAGNOSIS — M9979 Connective tissue and disc stenosis of intervertebral foramina of abdomen and other regions: Secondary | ICD-10-CM

## 2016-01-11 ENCOUNTER — Other Ambulatory Visit: Payer: Self-pay | Admitting: Family Medicine

## 2016-01-11 DIAGNOSIS — I2510 Atherosclerotic heart disease of native coronary artery without angina pectoris: Secondary | ICD-10-CM

## 2016-02-16 ENCOUNTER — Encounter: Payer: Self-pay | Admitting: Family Medicine

## 2016-02-16 ENCOUNTER — Ambulatory Visit (INDEPENDENT_AMBULATORY_CARE_PROVIDER_SITE_OTHER): Payer: Federal, State, Local not specified - PPO | Admitting: Family Medicine

## 2016-02-16 VITALS — BP 126/82 | HR 68 | Temp 97.9°F | Resp 16 | Wt 270.0 lb

## 2016-02-16 DIAGNOSIS — E119 Type 2 diabetes mellitus without complications: Secondary | ICD-10-CM | POA: Diagnosis not present

## 2016-02-16 DIAGNOSIS — I10 Essential (primary) hypertension: Secondary | ICD-10-CM

## 2016-02-16 DIAGNOSIS — M10071 Idiopathic gout, right ankle and foot: Secondary | ICD-10-CM

## 2016-02-16 DIAGNOSIS — E7800 Pure hypercholesterolemia, unspecified: Secondary | ICD-10-CM

## 2016-02-16 DIAGNOSIS — M109 Gout, unspecified: Secondary | ICD-10-CM | POA: Insufficient documentation

## 2016-02-16 LAB — POCT GLYCOSYLATED HEMOGLOBIN (HGB A1C): Hemoglobin A1C: 6.2

## 2016-02-16 MED ORDER — LISINOPRIL 20 MG PO TABS: 20.0000 mg | ORAL_TABLET | Freq: Every day | ORAL | Status: DC

## 2016-02-16 NOTE — Progress Notes (Addendum)
Patient ID: Laura Nielsen, female   DOB: 01/17/1956, 60 y.o.   MRN: [id number]         Patient: Laura Nielsen Female    DOB: [birth year]   60 y.o.   MRN: [id number] Visit Date: 02/16/2016  Today's Provider: Margarita Rana, MD   Chief Complaint  Patient presents with  . Hyperlipidemia  . Hypertension  . Diabetes   Subjective:    HPI    Diabetes Mellitus Type II, Follow-up:   Lab Results  Component Value Date   HGBA1C 6.2 02/16/2016   HGBA1C 6.2 11/17/2015   HGBA1C 6.5 07/28/2015   Last seen for diabetes 3 months ago.  Management since then includes None. She reports excellent compliance with treatment. She is not having side effects.  Current symptoms include none and have been stable. Home blood sugar records: Pt does not check her blood sugar at home.  Episodes of hypoglycemia? Very rarely; only when she hasn't eaten.   Most Recent Eye Exam: Pt is overdue for an eye exam. Weight trend: stable  Current diet: in general, a "healthy" diet   Current exercise: None  Pt reports having trouble with chronic back pain.  ------------------------------------------------------------------------   Hypertension, follow-up:  BP Readings from Last 3 Encounters:  02/16/16 126/82  11/17/15 124/76  08/18/15 114/78    She was last seen for hypertension 3 months ago.  Management since that visit includes None .She reports excellent compliance with treatment. She is not having side effects.  She is not exercising. She is adherent to low salt diet.   Outside blood pressures are:  Pt reports she does not check her BP at home. She is experiencing none.  Patient denies chest pain, dyspnea, near-syncope and palpitations.   Cardiovascular risk factors include diabetes mellitus, dyslipidemia, hypertension and smoking/ tobacco exposure.  ------------------------------------------------------------------------    Lipid/Cholesterol, Follow-up:   Last seen for this 3 months ago.    Management since that visit includes None.  Last Lipid Panel:    Component Value Date/Time   CHOL 158 03/31/2015   TRIG 139 03/31/2015   HDL 29* 03/31/2015   LDLCALC 101 03/31/2015    She reports excellent compliance with treatment. She is not having side effects.   Wt Readings from Last 3 Encounters:  02/16/16 270 lb (122.471 kg)  11/17/15 269 lb (122.018 kg)  08/18/15 268 lb (121.564 kg)    ------------------------------------------------------------------------        Allergies  Allergen Reactions  . Omeprazole Nausea And Vomiting   Previous Medications   ALBUTEROL (PROVENTIL HFA) 108 (90 BASE) MCG/ACT INHALER    Inhale into the lungs.   ASPIRIN 81 MG TABLET    Take 81 mg by mouth daily.    CHOLECALCIFEROL (VITAMIN D) 1000 UNITS TABLET    Take 1,000 Units by mouth daily.   CLOPIDOGREL (PLAVIX) 75 MG TABLET    Take 1 tablet (75 mg total) by mouth daily.   CYANOCOBALAMIN (RA VITAMIN B-12 TR) 1000 MCG TBCR    Take 2 tablets by mouth daily.    GABAPENTIN (NEURONTIN) 300 MG CAPSULE    Take 1 capsule (300 mg total) by mouth 3 (three) times daily.   IBUPROFEN 200 MG CAPS    Take by mouth.    LORATADINE (CLARITIN) 10 MG TABLET    Take 10 mg by mouth daily as needed.    LOVASTATIN (MEVACOR) 40 MG TABLET    Take 1 1/2 Tablets Daily.   OXYCODONE-ACETAMINOPHEN (PERCOCET) 5-325 MG PER TABLET  Take 1 tablet by mouth every 8 (eight) hours as needed.    TRIAMCINOLONE CREAM (KENALOG) 0.1 %    TRIAMCINOLONE ACETONIDE, 0.1% (External Cream)  1 Cream Cream apply to hands bid for 0 days  Quantity: 60;  Refills: 0   Ordered :12-May-2011  [name] ;  Started 12-May-2011 Active Comments: DX: 692.9   VITAMIN C (ASCORBIC ACID) 500 MG TABLET    Take 500 mg by mouth.     Review of Systems  Constitutional: Negative for fever, chills, diaphoresis, activity change, appetite change, fatigue and unexpected weight change.  Respiratory: Negative.   Cardiovascular: Positive for leg  swelling (Right ankle swells.). Negative for chest pain and palpitations.  Gastrointestinal: Negative.   Endocrine: Negative for cold intolerance, heat intolerance, polydipsia, polyphagia and polyuria.  Neurological: Negative for dizziness, light-headedness and headaches.    Social History  Substance Use Topics  . Smoking status: Current Every Day Smoker -- 1.00 packs/day for 30 years    Types: Cigarettes  . Smokeless tobacco: Never Used  . Alcohol Use: Yes     Comment: Rarely   Objective:   BP 126/82 mmHg  Pulse 68  Temp(Src) 97.9 F (36.6 C) (Oral)  Resp 16  Wt 270 lb (122.471 kg)  Results for orders placed or performed in visit on 02/16/16  POCT glycosylated hemoglobin (Hb A1C)  Result Value Ref Range   Hemoglobin A1C 6.2     Physical Exam  Constitutional: She is oriented to person, place, and time. She appears well-developed and well-nourished.  Cardiovascular: Normal rate, regular rhythm and normal heart sounds.   Pulmonary/Chest: Effort normal and breath sounds normal.  Neurological: She is alert and oriented to person, place, and time.  Psychiatric: She has a normal mood and affect. Her behavior is normal. Judgment and thought content normal.        Assessment & Plan:     1. Benign essential HTN Stable; will D/C Ziac secondary to gout and increase Lisinopril to '20mg'$ .  Will recheck in 3 months.  Follow up with Dr. Caryn Section.   - CBC with Differential/Platelet - TSH - lisinopril (PRINIVIL,ZESTRIL) 20 MG tablet; Take 1 tablet (20 mg total) by mouth daily.  Dispense: 90 tablet; Refill: 1  2. Hypercholesteremia Stable will check labs.  - Lipid panel  3. Type 2 diabetes mellitus without complication, without long-term current use of insulin (HCC) A1C Stable at 6.2%.  Recheck in 3 months.    - POCT glycosylated hemoglobin (Hb A1C) - Comprehensive metabolic panel  4. Idiopathic gout of right ankle, unspecified chronicity Will recheck labs today and again at 6  weeks after medication changes. - Uric acid    Patient was seen and examined by Jerrell Belfast, MD, and note scribed by Ashley Royalty, CMA.  I have reviewed the document for accuracy and completeness and I agree with above. - Jerrell Belfast, MD  Margarita Rana, MD  [facility]

## 2016-02-17 LAB — CBC WITH DIFFERENTIAL/PLATELET
Basophils Absolute: 0 10*3/uL (ref 0.0–0.2)
Basos: 0 %
EOS (ABSOLUTE): 0.3 10*3/uL (ref 0.0–0.4)
Eos: 3 %
Hematocrit: 47.8 % — ABNORMAL HIGH (ref 34.0–46.6)
Hemoglobin: 16.4 g/dL — ABNORMAL HIGH (ref 11.1–15.9)
Immature Grans (Abs): 0 10*3/uL (ref 0.0–0.1)
Immature Granulocytes: 0 %
Lymphocytes Absolute: 4.3 10*3/uL — ABNORMAL HIGH (ref 0.7–3.1)
Lymphs: 43 %
MCH: 29.8 pg (ref 26.6–33.0)
MCHC: 34.3 g/dL (ref 31.5–35.7)
MCV: 87 fL (ref 79–97)
Monocytes Absolute: 0.5 10*3/uL (ref 0.1–0.9)
Monocytes: 5 %
Neutrophils Absolute: 4.8 10*3/uL (ref 1.4–7.0)
Neutrophils: 49 %
Platelets: 242 10*3/uL (ref 150–379)
RBC: 5.5 x10E6/uL — ABNORMAL HIGH (ref 3.77–5.28)
RDW: 14.2 % (ref 12.3–15.4)
WBC: 10 10*3/uL (ref 3.4–10.8)

## 2016-02-17 LAB — COMPREHENSIVE METABOLIC PANEL
ALT: 21 IU/L (ref 0–32)
AST: 16 IU/L (ref 0–40)
Albumin/Globulin Ratio: 1.8 (ref 1.2–2.2)
Albumin: 4.5 g/dL (ref 3.5–5.5)
Alkaline Phosphatase: 107 IU/L (ref 39–117)
BUN/Creatinine Ratio: 16 (ref 9–23)
BUN: 12 mg/dL (ref 6–24)
Bilirubin Total: 0.5 mg/dL (ref 0.0–1.2)
CO2: 24 mmol/L (ref 18–29)
Calcium: 10.4 mg/dL — ABNORMAL HIGH (ref 8.7–10.2)
Chloride: 102 mmol/L (ref 96–106)
Creatinine, Ser: 0.76 mg/dL (ref 0.57–1.00)
GFR calc Af Amer: 99 mL/min/{1.73_m2} (ref >59)
GFR calc non Af Amer: 86 mL/min/{1.73_m2} (ref >59)
Globulin, Total: 2.5 g/dL (ref 1.5–4.5)
Glucose: 113 mg/dL — ABNORMAL HIGH (ref 65–99)
Potassium: 4.4 mmol/L (ref 3.5–5.2)
Sodium: 143 mmol/L (ref 134–144)
Total Protein: 7 g/dL (ref 6.0–8.5)

## 2016-02-17 LAB — LIPID PANEL
Chol/HDL Ratio: 4.3 ratio units (ref 0.0–4.4)
Cholesterol, Total: 151 mg/dL (ref 100–199)
HDL: 35 mg/dL — ABNORMAL LOW (ref >39)
LDL Calculated: 91 mg/dL (ref 0–99)
Triglycerides: 125 mg/dL (ref 0–149)
VLDL Cholesterol Cal: 25 mg/dL (ref 5–40)

## 2016-02-17 LAB — URIC ACID: Uric Acid: 6.8 mg/dL (ref 2.5–7.1)

## 2016-02-17 LAB — TSH: TSH: 2.91 u[IU]/mL (ref 0.450–4.500)

## 2016-02-18 ENCOUNTER — Telehealth: Payer: Self-pay

## 2016-02-18 MED ORDER — ALLOPURINOL 100 MG PO TABS: 100.0000 mg | ORAL_TABLET | Freq: Every day | ORAL | Status: DC

## 2016-02-18 NOTE — Telephone Encounter (Signed)
Patient advised as below.  

## 2016-02-18 NOTE — Telephone Encounter (Signed)
-----  Message from Margarita Rana, MD sent at 02/17/2016 11:01 AM EDT ----- Labs fairly stable. Uric acid low normal. Start Allopurinol 100 mg daily and recheck uric acid in 2 weeks after starting. Also stay well hydrated and recheck met c and cbc at that time secondary to high hemoglobin and calcium. Thanks.

## 2016-03-31 ENCOUNTER — Other Ambulatory Visit: Payer: Self-pay | Admitting: Family Medicine

## 2016-03-31 DIAGNOSIS — I10 Essential (primary) hypertension: Secondary | ICD-10-CM

## 2016-04-04 ENCOUNTER — Telehealth: Payer: Self-pay | Admitting: Family Medicine

## 2016-04-04 NOTE — Telephone Encounter (Signed)
Pt states she is wanting to go a diet that she will eat a grapefruit every morning.  Pt states her cholesterol medication states she should not eat grapefruit.  Pt is asking if she can change to a different Rx?  TC#[phone]/[id number]

## 2016-04-04 NOTE — Telephone Encounter (Signed)
All cholesterol medication can not have grapefruit. Will have to substitute another fruit.  Thanks.

## 2016-04-05 NOTE — Telephone Encounter (Signed)
Patient advised as below. sd 

## 2016-05-18 ENCOUNTER — Encounter: Payer: Self-pay | Admitting: Family Medicine

## 2016-05-18 ENCOUNTER — Ambulatory Visit (INDEPENDENT_AMBULATORY_CARE_PROVIDER_SITE_OTHER): Payer: Federal, State, Local not specified - PPO | Admitting: Family Medicine

## 2016-05-18 VITALS — BP 122/84 | HR 92 | Temp 98.4°F | Resp 16 | Ht 68.0 in | Wt 263.0 lb

## 2016-05-18 DIAGNOSIS — M10071 Idiopathic gout, right ankle and foot: Secondary | ICD-10-CM

## 2016-05-18 DIAGNOSIS — D751 Secondary polycythemia: Secondary | ICD-10-CM | POA: Diagnosis not present

## 2016-05-18 DIAGNOSIS — E538 Deficiency of other specified B group vitamins: Secondary | ICD-10-CM | POA: Diagnosis not present

## 2016-05-18 DIAGNOSIS — I10 Essential (primary) hypertension: Secondary | ICD-10-CM

## 2016-05-18 DIAGNOSIS — E559 Vitamin D deficiency, unspecified: Secondary | ICD-10-CM

## 2016-05-18 DIAGNOSIS — E119 Type 2 diabetes mellitus without complications: Secondary | ICD-10-CM | POA: Diagnosis not present

## 2016-05-18 LAB — POCT GLYCOSYLATED HEMOGLOBIN (HGB A1C)
Est. average glucose Bld gHb Est-mCnc: 128
Hemoglobin A1C: 6.1

## 2016-05-18 NOTE — Progress Notes (Signed)
Patient: Laura Nielsen Female    DOB: 06-Oct-1956   60 y.o.   MRN: [id number] Visit Date: 05/18/2016  Today's Provider: Lelon Huh, MD   Chief Complaint  Patient presents with  . Follow-up  . Diabetes  . Hypertension  . Gout   Subjective:    HPI   Idiopathic gout of right ankle, unspecified chronicity: From 02/16/2016-Started Allopurinol 100 mg daily and discontinued Ziac due to borderline elevation of uric acid. Has no more gout attacks since last visit.  Was also noted to have elevated hgb and slightly elevated calcium, which may have been effect of Ziac which was discontinued as below.  Lab Results  Component Value Date   LABURIC 6.8 02/16/2016   Lab Results  Component Value Date   WBC 10.0 02/16/2016   HGB 16.5* 03/31/2015   HCT 47.8* 02/16/2016   MCV 87 02/16/2016   PLT 242 02/16/2016   CMP Latest Ref Rng 02/16/2016 11/17/2015 03/31/2015  Glucose 65 - 99 mg/dL 113(H) 128(H) -  BUN 6 - 24 mg/dL '12 17 13  '$ Creatinine 0.57 - 1.00 mg/dL 0.76 0.77 0.8  Sodium 134 - 144 mmol/L 143 143 142  Potassium 3.5 - 5.2 mmol/L 4.4 4.8 4.6  Chloride 96 - 106 mmol/L 102 104 -  CO2 18 - 29 mmol/L 24 21 -  Calcium 8.7 - 10.2 mg/dL 10.4(H) 10.1 -  Total Protein 6.0 - 8.5 g/dL 7.0 6.8 -  Total Bilirubin 0.0 - 1.2 mg/dL 0.5 0.5 -  Alkaline Phos 39 - 117 IU/L 107 105 -  AST 0 - 40 IU/L '16 14 18  '$ ALT 0 - 32 IU/L '21 18 23      '$ Diabetes Mellitus Type II, Follow-up:   Lab Results  Component Value Date   HGBA1C 6.2 02/16/2016   HGBA1C 6.2 11/17/2015   HGBA1C 6.5 07/28/2015   Last seen for diabetes 3 months ago.  Management since then includes; no changes.. She reports good compliance with treatment. She is not having side effects. none Current symptoms include none and have been unchanged. Home blood sugar records: fasting range: does not check  Episodes of hypoglycemia? no   Current Insulin Regimen: n/a Most Recent Eye Exam: due Weight trend: stable Prior visit with  dietician: no Current diet: well balanced Current exercise: none  ----------------------------------------------------------------------   Hypertension, follow-up:  BP Readings from Last 3 Encounters:  05/18/16 122/84  02/16/16 126/82  11/17/15 124/76    She was last seen for hypertension 3 months ago.  BP at that visit was 126/82. Management since that visit includes; discontinued Ziac, secondary to gout. Increased lisinopril to 20 mg qd.She reports good compliance with treatment. She is not having side effects. none  She is not exercising. She is not adherent to low salt diet.   Outside blood pressures are not checking. She is experiencing none.  Patient denies none.   Cardiovascular risk factors include none.  Use of agents associated with hypertension: none.   ----------------------------------------------------------------------    Allergies  Allergen Reactions  . Omeprazole Nausea And Vomiting   Current Meds  Medication Sig  . albuterol (PROVENTIL HFA) 108 (90 BASE) MCG/ACT inhaler Inhale into the lungs.  Marland Kitchen allopurinol (ZYLOPRIM) 100 MG tablet Take 1 tablet (100 mg total) by mouth daily.  Marland Kitchen aspirin 81 MG tablet Take 81 mg by mouth daily.   . cholecalciferol (VITAMIN D) 1000 UNITS tablet Take 1,000 Units by mouth daily.  . clopidogrel (PLAVIX) 75 MG tablet  Take 1 tablet (75 mg total) by mouth daily.  . Cyanocobalamin (RA VITAMIN B-12 TR) 1000 MCG TBCR Take 2 tablets by mouth daily.   Marland Kitchen gabapentin (NEURONTIN) 300 MG capsule Take 1 capsule (300 mg total) by mouth 3 (three) times daily.  . Ibuprofen 200 MG CAPS Take by mouth.   Marland Kitchen lisinopril (PRINIVIL,ZESTRIL) 20 MG tablet Take 1 tablet (20 mg total) by mouth daily.  Marland Kitchen loratadine (CLARITIN) 10 MG tablet Take 10 mg by mouth daily as needed.   . lovastatin (MEVACOR) 40 MG tablet Take 1 1/2 Tablets Daily.  Marland Kitchen oxyCODONE-acetaminophen (PERCOCET) 5-325 MG per tablet Take 1 tablet by mouth every 8 (eight) hours as needed.     . triamcinolone cream (KENALOG) 0.1 % TRIAMCINOLONE ACETONIDE, 0.1% (External Cream)  1 Cream Cream apply to hands bid for 0 days  Quantity: 60;  Refills: 0   Ordered :12-May-2011  [name] ;  Started 12-May-2011 Active Comments: DX: 692.9  . vitamin C (ASCORBIC ACID) 500 MG tablet Take 500 mg by mouth.   . [DISCONTINUED] lisinopril (PRINIVIL,ZESTRIL) 10 MG tablet Take 1 tablet (10 mg total) by mouth daily.    Review of Systems  Constitutional: Negative for fever, chills, appetite change and fatigue.  Respiratory: Negative for chest tightness and shortness of breath.   Cardiovascular: Negative for chest pain and palpitations.  Gastrointestinal: Negative for nausea, vomiting and abdominal pain.  Neurological: Negative for dizziness and weakness.    Social History  Substance Use Topics  . Smoking status: Current Every Day Smoker -- 1.00 packs/day for 30 years    Types: Cigarettes  . Smokeless tobacco: Never Used  . Alcohol Use: Yes     Comment: Rarely   Objective:   BP 122/84 mmHg  Pulse 92  Temp(Src) 98.4 F (36.9 C) (Oral)  Resp 16  Ht '5\' 8"'$  (1.727 m)  Wt 263 lb (119.296 kg)  BMI 40.00 kg/m2  SpO2 95%  Physical Exam  General Appearance:    Alert, cooperative, no distress, obese  Eyes:    PERRL, conjunctiva/corneas clear, EOM's intact       Lungs:     Clear to auscultation bilaterally, respirations unlabored  Heart:    Regular rate and rhythm  Neurologic:   Awake, alert, oriented x 3. No apparent focal neurological           defect.       Results for orders placed or performed in visit on 05/18/16  POCT glycosylated hemoglobin (Hb A1C)  Result Value Ref Range   Hemoglobin A1C 6.1    Est. average glucose Bld gHb Est-mCnc 128        Assessment & Plan:     1. Type 2 diabetes mellitus without complication, without long-term current use of insulin (HCC) Well controlled with diet - POCT glycosylated hemoglobin (Hb A1C)  2. Idiopathic gout of right ankle,  unspecified chronicity Resolved, tolerating allopurinol well.   3. Avitaminosis D  - VITAMIN D 25 Hydroxy (Vit-D Deficiency, Fractures)  4. B12 deficiency  - Vitamin B12  5. Benign essential HTN Well controlled.  Continue current medications.   - Renal function panel  6. Polycythemia Likely due to smoking. Check labs for stability.  - CBC     The entirety of the information documented in the History of Present Illness, Review of Systems and Physical Exam were personally obtained by me. Portions of this information were initially documented by April M. Sabra Heck, CMA and reviewed by me for thoroughness and  accuracy.    Lelon Huh, MD  [facility]

## 2016-05-19 LAB — RENAL FUNCTION PANEL
Albumin: 4.7 g/dL (ref 3.5–5.5)
BUN/Creatinine Ratio: 16 (ref 9–23)
BUN: 13 mg/dL (ref 6–24)
CO2: 23 mmol/L (ref 18–29)
Calcium: 10.4 mg/dL — ABNORMAL HIGH (ref 8.7–10.2)
Chloride: 103 mmol/L (ref 96–106)
Creatinine, Ser: 0.83 mg/dL (ref 0.57–1.00)
GFR calc Af Amer: 89 mL/min/{1.73_m2} (ref >59)
GFR calc non Af Amer: 77 mL/min/{1.73_m2} (ref >59)
Glucose: 107 mg/dL — ABNORMAL HIGH (ref 65–99)
Phosphorus: 4 mg/dL (ref 2.5–4.5)
Potassium: 4.5 mmol/L (ref 3.5–5.2)
Sodium: 143 mmol/L (ref 134–144)

## 2016-05-19 LAB — CBC
Hematocrit: 49.4 % — ABNORMAL HIGH (ref 34.0–46.6)
Hemoglobin: 16.4 g/dL — ABNORMAL HIGH (ref 11.1–15.9)
MCH: 28.3 pg (ref 26.6–33.0)
MCHC: 33.2 g/dL (ref 31.5–35.7)
MCV: 85 fL (ref 79–97)
Platelets: 262 10*3/uL (ref 150–379)
RBC: 5.8 x10E6/uL — ABNORMAL HIGH (ref 3.77–5.28)
RDW: 14.1 % (ref 12.3–15.4)
WBC: 9.2 10*3/uL (ref 3.4–10.8)

## 2016-05-19 LAB — VITAMIN B12: Vitamin B-12: 432 pg/mL (ref 211–946)

## 2016-05-19 LAB — VITAMIN D 25 HYDROXY (VIT D DEFICIENCY, FRACTURES): Vit D, 25-Hydroxy: 21.9 ng/mL — ABNORMAL LOW (ref 30.0–100.0)

## 2016-06-23 ENCOUNTER — Other Ambulatory Visit: Payer: Self-pay | Admitting: Family Medicine

## 2016-07-21 ENCOUNTER — Other Ambulatory Visit: Payer: Self-pay | Admitting: Family Medicine

## 2016-07-21 DIAGNOSIS — E7800 Pure hypercholesterolemia, unspecified: Secondary | ICD-10-CM

## 2016-07-21 NOTE — Telephone Encounter (Signed)
LOV with you was 05/18/2016. Renaldo Fiddler, CMA

## 2016-08-11 ENCOUNTER — Other Ambulatory Visit: Payer: Self-pay | Admitting: Family Medicine

## 2016-08-11 DIAGNOSIS — M9979 Connective tissue and disc stenosis of intervertebral foramina of abdomen and other regions: Secondary | ICD-10-CM

## 2016-09-25 ENCOUNTER — Ambulatory Visit (INDEPENDENT_AMBULATORY_CARE_PROVIDER_SITE_OTHER): Payer: Federal, State, Local not specified - PPO | Admitting: Family Medicine

## 2016-09-25 ENCOUNTER — Encounter: Payer: Self-pay | Admitting: Family Medicine

## 2016-09-25 VITALS — BP 132/90 | HR 88 | Temp 97.9°F | Resp 16 | Wt 266.0 lb

## 2016-09-25 DIAGNOSIS — E119 Type 2 diabetes mellitus without complications: Secondary | ICD-10-CM

## 2016-09-25 DIAGNOSIS — Z720 Tobacco use: Secondary | ICD-10-CM | POA: Diagnosis not present

## 2016-09-25 DIAGNOSIS — I10 Essential (primary) hypertension: Secondary | ICD-10-CM

## 2016-09-25 DIAGNOSIS — I2510 Atherosclerotic heart disease of native coronary artery without angina pectoris: Secondary | ICD-10-CM

## 2016-09-25 DIAGNOSIS — F329 Major depressive disorder, single episode, unspecified: Secondary | ICD-10-CM | POA: Diagnosis not present

## 2016-09-25 DIAGNOSIS — Z23 Encounter for immunization: Secondary | ICD-10-CM | POA: Diagnosis not present

## 2016-09-25 DIAGNOSIS — G4733 Obstructive sleep apnea (adult) (pediatric): Secondary | ICD-10-CM | POA: Diagnosis not present

## 2016-09-25 DIAGNOSIS — F32A Depression, unspecified: Secondary | ICD-10-CM

## 2016-09-25 LAB — POCT GLYCOSYLATED HEMOGLOBIN (HGB A1C)
Est. average glucose Bld gHb Est-mCnc: 134
Hemoglobin A1C: 6.3

## 2016-09-25 MED ORDER — BUPROPION HCL ER (SR) 150 MG PO TB12: ORAL_TABLET | ORAL | 6 refills | Status: DC

## 2016-09-25 MED ORDER — BISOPROLOL FUMARATE 10 MG PO TABS: 10.0000 mg | ORAL_TABLET | Freq: Every day | ORAL | 4 refills | Status: DC

## 2016-09-25 NOTE — Progress Notes (Signed)
Patient: Laura Nielsen Female    DOB: [birth year]   60 y.o.   MRN: [id number] Visit Date: 09/25/2016  Today's Provider: Lelon Huh, MD   Chief Complaint  Patient presents with  . Diabetes    follow up  . Hypertension    follow up   Subjective:    HPI  Diabetes Mellitus Type II, Follow-up:   Lab Results  Component Value Date   HGBA1C 6.1 05/18/2016   HGBA1C 6.2 02/16/2016   HGBA1C 6.2 11/17/2015    Last seen for diabetes 4 months ago.  Management since then includes no changes. She reports good compliance with treatment. She is not having side effects.  Current symptoms include polydipsia and visual disturbances and have been stable. Home blood sugar records: does not check blood sugars at home  Episodes of hypoglycemia? no   Current Insulin Regimen: none Most Recent Eye Exam: >1 year Weight trend: fluctuating a bit Prior visit with dietician: no Current diet: in general, an "unhealthy" diet Current exercise: none  Pertinent Labs:    Component Value Date/Time   CHOL 151 02/16/2016 0941   TRIG 125 02/16/2016 0941   HDL 35 (L) 02/16/2016 0941   LDLCALC 91 02/16/2016 0941   CREATININE 0.83 05/18/2016 0904   CREATININE 0.57 (L) 03/28/2013 0354    Wt Readings from Last 3 Encounters:  05/18/16 263 lb (119.3 kg)  02/16/16 270 lb (122.5 kg)  11/17/15 269 lb (122 kg)    ------------------------------------------------------------------------  Hypertension, follow-up:  BP Readings from Last 3 Encounters:  05/18/16 122/84  02/16/16 126/82  11/17/15 124/76    She was last seen for hypertension 4 months ago.  BP at that visit was 122/84. Management since that visit includes no changes. She reports good compliance with treatment. She is not having side effects.  She is not exercising. She is adherent to low salt diet.   Outside blood pressures are not being checked. She is experiencing fatigue.  Patient denies chest pain, chest  pressure/discomfort, claudication, dyspnea, exertional chest pressure/discomfort, irregular heart beat, lower extremity edema, near-syncope, orthopnea, palpitations, paroxysmal nocturnal dyspnea, syncope and tachypnea.   Cardiovascular risk factors include diabetes mellitus, hypertension, obesity (BMI >= 30 kg/m2) and smoking/ tobacco exposure.  Use of agents associated with hypertension: NSAIDS.     Weight trend: fluctuating a bit Wt Readings from Last 3 Encounters:  05/18/16 263 lb (119.3 kg)  02/16/16 270 lb (122.5 kg)  11/17/15 269 lb (122 kg)    Current diet: in general, an "unhealthy" diet  ------------------------------------------------------------------------ Follow up Vitamin D Deficiency: Patient was last seen for this problem 4 months ago. Labs were checked showing low vitamin D level. Patient was started on Vitamin D 3 2,000 units daily. Patient reports good compliance with treatment.   Tobacco abuse She states she is still smoking 1 1/2 ppd. Desire to smoke is exacerbated by daily pains and difficulty getting out and about. Limitations in mobility also contribute to her feeling depressed. She has tried nicotine products without success.     Allergies  Allergen Reactions  . Omeprazole Nausea And Vomiting     Current Outpatient Prescriptions:  .  albuterol (PROVENTIL HFA) 108 (90 BASE) MCG/ACT inhaler, Inhale into the lungs., Disp: , Rfl:  .  allopurinol (ZYLOPRIM) 100 MG tablet, Take 1 tablet (100 mg total) by mouth daily., Disp: 30 tablet, Rfl: 6 .  aspirin 81 MG tablet, Take 81 mg by mouth daily. , Disp: , Rfl:  .  cholecalciferol (VITAMIN D) 1000 UNITS tablet, Take 2,000 Units by mouth daily. , Disp: , Rfl:  .  clopidogrel (PLAVIX) 75 MG tablet, Take 1 tablet (75 mg total) by mouth daily., Disp: 90 tablet, Rfl: 3 .  Cyanocobalamin (RA VITAMIN B-12 TR) 1000 MCG TBCR, Take 2 tablets by mouth daily. , Disp: , Rfl:  .  gabapentin (NEURONTIN) 300 MG capsule, Take 1  capsule (300 mg total) by mouth 3 (three) times daily., Disp: 90 capsule, Rfl: 5 .  Ibuprofen 200 MG CAPS, Take by mouth. , Disp: , Rfl:  .  lisinopril (PRINIVIL,ZESTRIL) 20 MG tablet, Take 1 tablet (20 mg total) by mouth daily., Disp: 90 tablet, Rfl: 1 .  loratadine (CLARITIN) 10 MG tablet, Take 10 mg by mouth daily as needed. , Disp: , Rfl:  .  lovastatin (MEVACOR) 40 MG tablet, Take 1 1/2 Tablets Daily., Disp: 45 tablet, Rfl: 5 .  oxyCODONE-acetaminophen (PERCOCET) 5-325 MG per tablet, Take 1 tablet by mouth every 8 (eight) hours as needed. , Disp: , Rfl:  .  triamcinolone cream (KENALOG) 0.1 %, TRIAMCINOLONE ACETONIDE, 0.1% (External Cream)  1 Cream Cream apply to hands bid for 0 days  Quantity: 60;  Refills: 0   Ordered :12-May-2011  Laura Nielsen ;  Started 12-May-2011 Active Comments: DX: 692.9, Disp: , Rfl:  .  vitamin C (ASCORBIC ACID) 500 MG tablet, Take 500 mg by mouth. , Disp: , Rfl:   Review of Systems  Constitutional: Positive for fatigue. Negative for appetite change, chills, diaphoresis and fever.  Eyes: Positive for visual disturbance.  Respiratory: Positive for cough and wheezing. Negative for chest tightness and shortness of breath.   Cardiovascular: Negative for chest pain and palpitations.  Gastrointestinal: Negative for abdominal pain, nausea and vomiting.  Endocrine: Positive for polydipsia. Negative for cold intolerance, heat intolerance, polyphagia and polyuria.  Musculoskeletal: Positive for arthralgias.  Neurological: Negative for dizziness and weakness.    Social History  Substance Use Topics  . Smoking status: Current Every Day Smoker    Packs/day: 1.00    Years: 30.00    Types: Cigarettes  . Smokeless tobacco: Never Used  . Alcohol use Yes     Comment: Rarely   Objective:   BP 132/90 (BP Location: Left Arm, Patient Position: Sitting, Cuff Size: Large)   Pulse 88   Temp 97.9 F (36.6 C) (Oral)   Resp 16   Wt 266 lb (120.7 kg)   SpO2 94% Comment: room  air  BMI 40.45 kg/m   Physical Exam   General Appearance:    Alert, cooperative, no distress  Eyes:    PERRL, conjunctiva/corneas clear, EOM's intact       Lungs:     Clear to auscultation bilaterally, respirations unlabored  Heart:    Regular rate and rhythm  Neurologic:   Awake, alert, oriented x 3. No apparent focal neurological           defect.           Assessment & Plan:     1. Type 2 diabetes mellitus without complication, without long-term current use of insulin (HCC) Stable. Diet controled.  - POCT HgB A1C  2. Benign essential HTN Was taking off Ziac earlier this year due to gout which has since greatly improved, but BP has been up. Start back on bisoprolol (without hctz) - bisoprolol (ZEBETA) 10 MG tablet; Take 1 tablet (10 mg total) by mouth daily.  Dispense: 30 tablet; Refill: 4  3. Obstructive apnea  4. Current tobacco use  - buPROPion (WELLBUTRIN SR) 150 MG 12 hr tablet; 1 tablet daily for 3 days, then 1 tablet twice daily. Stop smoking 14 days after starting medication  Dispense: 60 tablet; Refill: 6 - Pneumococcal polysaccharide vaccine 23-valent greater than or equal to 2yo subcutaneous/IM  5. CAD in native artery Asymptomatic. Compliant with medication.  Continue aggressive risk factor modification.  Continue routine follow up with cardiology. Previously on atorvastatin but changed to lovastatin due to elevated blood sugars.  - Pneumococcal polysaccharide vaccine 23-valent greater than or equal to 2yo subcutaneous/IM  6. Need for pneumococcal vaccination  - Pneumococcal polysaccharide vaccine 23-valent greater than or equal to 2yo subcutaneous/IM  7. Depression, unspecified depression type  - buPROPion (WELLBUTRIN SR) 150 MG 12 hr tablet; 1 tablet daily for 3 days, then 1 tablet twice daily. Stop smoking 14 days after starting medication  Dispense: 60 tablet; Refill: 6       Lelon Huh, MD  [facility]

## 2016-09-25 NOTE — Patient Instructions (Addendum)
Screening for lung cancer is recommended for people between 57 and 59 years of age who have smoked at 1 pack per day or more for at least 46 years. Please call our office at [phone] to schedule a low dose CT lung scan for lung cancer screening.    Smoking Cessation, Tips for Success If you are ready to quit smoking, congratulations! You have chosen to help yourself be healthier. Cigarettes bring nicotine, tar, carbon monoxide, and other irritants into your body. Your lungs, heart, and blood vessels will be able to work better without these poisons. There are many different ways to quit smoking. Nicotine gum, nicotine patches, a nicotine inhaler, or nicotine nasal spray can help with physical craving. Hypnosis, support groups, and medicines help break the habit of smoking. WHAT THINGS CAN I DO TO MAKE QUITTING EASIER?  Here are some tips to help you quit for good:  Pick a date when you will quit smoking completely. Tell all of your friends and family about your plan to quit on that date.  Do not try to slowly cut down on the number of cigarettes you are smoking. Pick a quit date and quit smoking completely starting on that day.  Throw away all cigarettes.   Clean and remove all ashtrays from your home, work, and car.  On a card, write down your reasons for quitting. Carry the card with you and read it when you get the urge to smoke.  Cleanse your body of nicotine. Drink enough water and fluids to keep your urine clear or pale yellow. Do this after quitting to flush the nicotine from your body.  Learn to predict your moods. Do not let a bad situation be your excuse to have a cigarette. Some situations in your life might tempt you into wanting a cigarette.  Never have "just one" cigarette. It leads to wanting another and another. Remind yourself of your decision to quit.  Change habits associated with smoking. If you smoked while driving or when feeling stressed, try other activities to  replace smoking. Stand up when drinking your coffee. Brush your teeth after eating. Sit in a different chair when you read the paper. Avoid alcohol while trying to quit, and try to drink fewer caffeinated beverages. Alcohol and caffeine may urge you to smoke.  Avoid foods and drinks that can trigger a desire to smoke, such as sugary or spicy foods and alcohol.  Ask people who smoke not to smoke around you.  Have something planned to do right after eating or having a cup of coffee. For example, plan to take a walk or exercise.  Try a relaxation exercise to calm you down and decrease your stress. Remember, you may be tense and nervous for the first 2 weeks after you quit, but this will pass.  Find new activities to keep your hands busy. Play with a pen, coin, or rubber band. Doodle or draw things on paper.  Brush your teeth right after eating. This will help cut down on the craving for the taste of tobacco after meals. You can also try mouthwash.   Use oral substitutes in place of cigarettes. Try using lemon drops, carrots, cinnamon sticks, or chewing gum. Keep them handy so they are available when you have the urge to smoke.  When you have the urge to smoke, try deep breathing.  Designate your home as a nonsmoking area.  If you are a heavy smoker, ask your health care provider about a prescription for  nicotine chewing gum. It can ease your withdrawal from nicotine.  Reward yourself. Set aside the cigarette money you save and buy yourself something nice.  Look for support from others. Join a support group or smoking cessation program. Ask someone at home or at work to help you with your plan to quit smoking.  Always ask yourself, "Do I need this cigarette or is this just a reflex?" Tell yourself, "Today, I choose not to smoke," or "I do not want to smoke." You are reminding yourself of your decision to quit.  Do not replace cigarette smoking with electronic cigarettes (commonly called  e-cigarettes). The safety of e-cigarettes is unknown, and some may contain harmful chemicals.  If you relapse, do not give up! Plan ahead and think about what you will do the next time you get the urge to smoke. HOW WILL I FEEL WHEN I QUIT SMOKING? You may have symptoms of withdrawal because your body is used to nicotine (the addictive substance in cigarettes). You may crave cigarettes, be irritable, feel very hungry, cough often, get headaches, or have difficulty concentrating. The withdrawal symptoms are only temporary. They are strongest when you first quit but will go away within 10-14 days. When withdrawal symptoms occur, stay in control. Think about your reasons for quitting. Remind yourself that these are signs that your body is healing and getting used to being without cigarettes. Remember that withdrawal symptoms are easier to treat than the major diseases that smoking can cause.  Even after the withdrawal is over, expect periodic urges to smoke. However, these cravings are generally short lived and will go away whether you smoke or not. Do not smoke! WHAT RESOURCES ARE AVAILABLE TO HELP ME QUIT SMOKING? Your health care provider can direct you to community resources or hospitals for support, which may include:  Group support.  Education.  Hypnosis.  Therapy.   This information is not intended to replace advice given to you by your health care provider. Make sure you discuss any questions you have with your health care provider.   Document Released: 08/18/2004 Document Revised: 12/11/2014 Document Reviewed: 05/08/2013 Elsevier Interactive Patient Education [employer].

## 2016-10-03 ENCOUNTER — Other Ambulatory Visit: Payer: Self-pay | Admitting: Family Medicine

## 2016-10-03 DIAGNOSIS — I10 Essential (primary) hypertension: Secondary | ICD-10-CM

## 2016-10-22 ENCOUNTER — Other Ambulatory Visit: Payer: Self-pay | Admitting: Family Medicine

## 2016-10-23 NOTE — Telephone Encounter (Signed)
Please review-aa 

## 2017-01-03 ENCOUNTER — Ambulatory Visit (INDEPENDENT_AMBULATORY_CARE_PROVIDER_SITE_OTHER): Payer: Federal, State, Local not specified - PPO | Admitting: Family Medicine

## 2017-01-03 ENCOUNTER — Encounter: Payer: Self-pay | Admitting: Family Medicine

## 2017-01-03 VITALS — BP 130/80 | HR 72 | Temp 98.1°F | Resp 16 | Ht 68.0 in | Wt 272.0 lb

## 2017-01-03 DIAGNOSIS — I10 Essential (primary) hypertension: Secondary | ICD-10-CM

## 2017-01-03 DIAGNOSIS — G4733 Obstructive sleep apnea (adult) (pediatric): Secondary | ICD-10-CM

## 2017-01-03 DIAGNOSIS — I2510 Atherosclerotic heart disease of native coronary artery without angina pectoris: Secondary | ICD-10-CM

## 2017-01-03 DIAGNOSIS — E538 Deficiency of other specified B group vitamins: Secondary | ICD-10-CM | POA: Diagnosis not present

## 2017-01-03 DIAGNOSIS — E559 Vitamin D deficiency, unspecified: Secondary | ICD-10-CM | POA: Diagnosis not present

## 2017-01-03 DIAGNOSIS — E119 Type 2 diabetes mellitus without complications: Secondary | ICD-10-CM

## 2017-01-03 LAB — POCT GLYCOSYLATED HEMOGLOBIN (HGB A1C)
Est. average glucose Bld gHb Est-mCnc: 128
Hemoglobin A1C: 6.1

## 2017-01-03 NOTE — Patient Instructions (Signed)
Steps to Quit Smoking Smoking tobacco can be bad for your health. It can also affect almost every organ in your body. Smoking puts you and people around you at risk for many serious long-lasting (chronic) diseases. Quitting smoking is hard, but it is one of the best things that you can do for your health. It is never too late to quit. What are the benefits of quitting smoking? When you quit smoking, you lower your risk for getting serious diseases and conditions. They can include:  Lung cancer or lung disease.  Heart disease.  Stroke.  Heart attack.  Not being able to have children (infertility).  Weak bones (osteoporosis) and broken bones (fractures). If you have coughing, wheezing, and shortness of breath, those symptoms may get better when you quit. You may also get sick less often. If you are pregnant, quitting smoking can help to lower your chances of having a baby of low birth weight. What can I do to help me quit smoking? Talk with your doctor about what can help you quit smoking. Some things you can do (strategies) include:  Quitting smoking totally, instead of slowly cutting back how much you smoke over a period of time.  Going to in-person counseling. You are more likely to quit if you go to many counseling sessions.  Using resources and support systems, such as:  Online chats with a counselor.  Phone quitlines.  Printed self-help materials.  Support groups or group counseling.  Text messaging programs.  Mobile phone apps or applications.  Taking medicines. Some of these medicines may have nicotine in them. If you are pregnant or breastfeeding, do not take any medicines to quit smoking unless your doctor says it is okay. Talk with your doctor about counseling or other things that can help you. Talk with your doctor about using more than one strategy at the same time, such as taking medicines while you are also going to in-person counseling. This can help make quitting  easier. What things can I do to make it easier to quit? Quitting smoking might feel very hard at first, but there is a lot that you can do to make it easier. Take these steps:  Talk to your family and friends. Ask them to support and encourage you.  Call phone quitlines, reach out to support groups, or work with a counselor.  Ask people who smoke to not smoke around you.  Avoid places that make you want (trigger) to smoke, such as:  Bars.  Parties.  Smoke-break areas at work.  Spend time with people who do not smoke.  Lower the stress in your life. Stress can make you want to smoke. Try these things to help your stress:  Getting regular exercise.  Deep-breathing exercises.  Yoga.  Meditating.  Doing a body scan. To do this, close your eyes, focus on one area of your body at a time from head to toe, and notice which parts of your body are tense. Try to relax the muscles in those areas.  Download or buy apps on your mobile phone or tablet that can help you stick to your quit plan. There are many free apps, such as QuitGuide from the CDC (Centers for Disease Control and Prevention). You can find more support from smokefree.gov and other websites. This information is not intended to replace advice given to you by your health care provider. Make sure you discuss any questions you have with your health care provider. Document Released: 09/16/2009 Document Revised: 07/18/2016 Document   Reviewed: 04/06/2015 Elsevier Interactive Patient Education  2017 Elsevier Inc.  

## 2017-01-03 NOTE — Progress Notes (Addendum)
Patient: Laura Nielsen Female    DOB: 14-Feb-1956   61 y.o.   MRN: [id number] Visit Date: 01/03/2017  Today's Provider: Lelon Huh, MD   Chief Complaint  Patient presents with  . Follow-up  . Diabetes  . Hypertension  . Depression  . Coronary Artery Disease   Subjective:    HPI  Current tobacco use From 09/25/2016-started WELLBUTRIN SR 150 MG 12 hr tablet. She has cut back from 2 ppd to 1 to 1 1/2 ppd, she feels it has reduced cigarette cravings, but she has not stopped all together.   CAD in native artery From 09/25/2016-no changes. Continue routine follow up with cardiology. Not having any chest pains or palpitations.     Diabetes Mellitus Type II, Follow-up:   Lab Results  Component Value Date   HGBA1C 6.1 01/03/2017   HGBA1C 6.3 09/25/2016   HGBA1C 6.1 05/18/2016   Last seen for diabetes 3 months ago.  Management since then includes; no changes.  She reports good compliance with treatment. She is not having side effects. none Current symptoms include none and have been unchanged. Home blood sugar records: fasting range: not checking  Episodes of hypoglycemia? no   Current Insulin Regimen: n/a Most Recent Eye Exam: due Weight trend: stable Prior visit with dietician: no Current diet: well balanced Current exercise: none  ----------------------------------------------------------------    Hypertension, follow-up:  BP Readings from Last 3 Encounters:  01/03/17 130/80  09/25/16 132/90  05/18/16 122/84    She was last seen for hypertension 3 months ago.  BP at that visit was 132/90. Management since that visit includes;Started back on bisoprolol .She reports good compliance with treatment. She is not having side effects. none She is not exercising. She is adherent to low salt diet.   Outside blood pressures are not checking. She is experiencing none.  Patient denies none.   Cardiovascular risk factors include diabetes mellitus.  Use of  agents associated with hypertension: none.   ---------------------------------------------------------------- Follow up Sleep apnea.  She reports CPAP is effective, feels much more rested when she uses it, and is uses every night.    Allergies  Allergen Reactions  . Atorvastatin     Elevated blood sugar  . Omeprazole Nausea And Vomiting     Current Outpatient Prescriptions:  .  albuterol (PROVENTIL HFA) 108 (90 BASE) MCG/ACT inhaler, Inhale into the lungs., Disp: , Rfl:  .  allopurinol (ZYLOPRIM) 100 MG tablet, Take 1 tablet (100 mg total) by mouth daily., Disp: 30 tablet, Rfl: 5 .  aspirin 81 MG tablet, Take 81 mg by mouth daily. , Disp: , Rfl:  .  bisoprolol (ZEBETA) 10 MG tablet, Take 1 tablet (10 mg total) by mouth daily., Disp: 30 tablet, Rfl: 4 .  buPROPion (WELLBUTRIN SR) 150 MG 12 hr tablet, 1 tablet daily for 3 days, then 1 tablet twice daily. Stop smoking 14 days after starting medication, Disp: 60 tablet, Rfl: 6 .  cholecalciferol (VITAMIN D) 1000 UNITS tablet, Take 2,000 Units by mouth daily. , Disp: , Rfl:  .  clopidogrel (PLAVIX) 75 MG tablet, Take 1 tablet (75 mg total) by mouth daily., Disp: 90 tablet, Rfl: 3 .  Cyanocobalamin (RA VITAMIN B-12 TR) 1000 MCG TBCR, Take 2 tablets by mouth daily. , Disp: , Rfl:  .  gabapentin (NEURONTIN) 300 MG capsule, Take 1 capsule (300 mg total) by mouth 3 (three) times daily., Disp: 90 capsule, Rfl: 5 .  Ibuprofen 200 MG CAPS, Take  by mouth. , Disp: , Rfl:  .  lisinopril (PRINIVIL,ZESTRIL) 20 MG tablet, Take 1 tablet (20 mg total) by mouth daily., Disp: 90 tablet, Rfl: 3 .  loratadine (CLARITIN) 10 MG tablet, Take 10 mg by mouth daily as needed. , Disp: , Rfl:  .  lovastatin (MEVACOR) 40 MG tablet, Take 1 1/2 Tablets Daily., Disp: 45 tablet, Rfl: 5 .  oxyCODONE-acetaminophen (PERCOCET) 5-325 MG per tablet, Take 1 tablet by mouth every 8 (eight) hours as needed. , Disp: , Rfl:  .  triamcinolone cream (KENALOG) 0.1 %, TRIAMCINOLONE  ACETONIDE, 0.1% (External Cream)  1 Cream Cream apply to hands bid for 0 days  Quantity: 60;  Refills: 0   Ordered :12-May-2011  Laura Nielsen ;  Started 12-May-2011 Active Comments: DX: 692.9, Disp: , Rfl:  .  vitamin C (ASCORBIC ACID) 500 MG tablet, Take 500 mg by mouth. , Disp: , Rfl:   Review of Systems  Constitutional: Negative for appetite change, chills, fatigue and fever.  Respiratory: Negative for chest tightness and shortness of breath.   Cardiovascular: Negative for chest pain and palpitations.  Gastrointestinal: Negative for abdominal pain, nausea and vomiting.  Neurological: Negative for dizziness and weakness.    Social History  Substance Use Topics  . Smoking status: Current Every Day Smoker    Packs/day: 1.50    Years: 30.00    Types: Cigarettes  . Smokeless tobacco: Never Used  . Alcohol use Yes     Comment: Rarely   Objective:   BP 130/80 (BP Location: Right Arm, Patient Position: Sitting, Cuff Size: Large)   Pulse 72   Temp 98.1 F (36.7 C) (Oral)   Resp 16   Ht '5\' 8"'$  (1.727 m)   Wt 272 lb (123.4 kg)   SpO2 97%   BMI 41.36 kg/m   Physical Exam   General Appearance:    Alert, cooperative, no distress, obese  Eyes:    PERRL, conjunctiva/corneas clear, EOM's intact       Lungs:     Clear to auscultation bilaterally, respirations unlabored  Heart:    Regular rate and rhythm  Neurologic:   Awake, alert, oriented x 3. No apparent focal neurological           defect.       Results for orders placed or performed in visit on 01/03/17  POCT glycosylated hemoglobin (Hb A1C)  Result Value Ref Range   Hemoglobin A1C 6.1    Est. average glucose Bld gHb Est-mCnc 128         Assessment & Plan:     1. Type 2 diabetes mellitus without complication, without long-term current use of insulin (HCC) Well controlled.  Continue current medications.   - POCT glycosylated hemoglobin (Hb A1C)  2. CAD in native artery Asymptomatic. Compliant with medication.  Continue  aggressive risk factor modification.   - Lipid panel - Comprehensive metabolic panel  3. B12 deficiency  - Vitamin B12   4. Benign essential HTN Well controlled.  Continue current medications.    5. Vitamin D deficiency  - VITAMIN D 25 Hydroxy (Vit-D Deficiency, Fractures)   6. Tobacco abuse Some reduction in cravings since starting buproprion. Advised to start making attempts to stop smoking completely. Also  Counseled and given printed information regarding lung cancer screening.   7. Sleep apnea She reports CPAP is effective, feels much more rested when she uses it, and is uses every night.   Return in about 4 months (around 05/03/2017).  Patient  refused flu vaccine today      Laura Huh, MD  [facility]

## 2017-01-10 LAB — COMPREHENSIVE METABOLIC PANEL
ALT: 21 IU/L (ref 0–32)
AST: 16 IU/L (ref 0–40)
Albumin/Globulin Ratio: 1.6 (ref 1.2–2.2)
Albumin: 4.1 g/dL (ref 3.6–4.8)
Alkaline Phosphatase: 114 IU/L (ref 39–117)
BUN/Creatinine Ratio: 17 (ref 12–28)
BUN: 13 mg/dL (ref 8–27)
Bilirubin Total: 0.4 mg/dL (ref 0.0–1.2)
CO2: 24 mmol/L (ref 18–29)
Calcium: 9.5 mg/dL (ref 8.7–10.3)
Chloride: 103 mmol/L (ref 96–106)
Creatinine, Ser: 0.78 mg/dL (ref 0.57–1.00)
GFR calc Af Amer: 96 mL/min/{1.73_m2} (ref >59)
GFR calc non Af Amer: 83 mL/min/{1.73_m2} (ref >59)
Globulin, Total: 2.5 g/dL (ref 1.5–4.5)
Glucose: 112 mg/dL — ABNORMAL HIGH (ref 65–99)
Potassium: 4.4 mmol/L (ref 3.5–5.2)
Sodium: 144 mmol/L (ref 134–144)
Total Protein: 6.6 g/dL (ref 6.0–8.5)

## 2017-01-10 LAB — VITAMIN D 25 HYDROXY (VIT D DEFICIENCY, FRACTURES): Vit D, 25-Hydroxy: 21.9 ng/mL — ABNORMAL LOW (ref 30.0–100.0)

## 2017-01-10 LAB — LIPID PANEL
Chol/HDL Ratio: 4.6 ratio units — ABNORMAL HIGH (ref 0.0–4.4)
Cholesterol, Total: 162 mg/dL (ref 100–199)
HDL: 35 mg/dL — ABNORMAL LOW (ref >39)
LDL Calculated: 102 mg/dL — ABNORMAL HIGH (ref 0–99)
Triglycerides: 124 mg/dL (ref 0–149)
VLDL Cholesterol Cal: 25 mg/dL (ref 5–40)

## 2017-01-10 LAB — VITAMIN B12: Vitamin B-12: 402 pg/mL (ref 232–1245)

## 2017-01-15 ENCOUNTER — Ambulatory Visit (INDEPENDENT_AMBULATORY_CARE_PROVIDER_SITE_OTHER): Payer: Self-pay | Admitting: Vascular Surgery

## 2017-01-15 ENCOUNTER — Other Ambulatory Visit (INDEPENDENT_AMBULATORY_CARE_PROVIDER_SITE_OTHER): Payer: Self-pay

## 2017-01-15 ENCOUNTER — Encounter (INDEPENDENT_AMBULATORY_CARE_PROVIDER_SITE_OTHER): Payer: Self-pay

## 2017-01-24 ENCOUNTER — Other Ambulatory Visit: Payer: Self-pay | Admitting: Family Medicine

## 2017-01-24 DIAGNOSIS — E7800 Pure hypercholesterolemia, unspecified: Secondary | ICD-10-CM

## 2017-01-26 ENCOUNTER — Encounter: Payer: Self-pay | Admitting: Family Medicine

## 2017-01-26 ENCOUNTER — Ambulatory Visit (INDEPENDENT_AMBULATORY_CARE_PROVIDER_SITE_OTHER): Payer: Federal, State, Local not specified - PPO | Admitting: Family Medicine

## 2017-01-26 VITALS — BP 142/82 | HR 80 | Temp 97.9°F | Resp 16 | Wt 269.0 lb

## 2017-01-26 DIAGNOSIS — G629 Polyneuropathy, unspecified: Secondary | ICD-10-CM

## 2017-01-26 DIAGNOSIS — M62830 Muscle spasm of back: Secondary | ICD-10-CM | POA: Diagnosis not present

## 2017-01-26 MED ORDER — AMITRIPTYLINE HCL 25 MG PO TABS: 25.0000 mg | ORAL_TABLET | Freq: Every day | ORAL | 0 refills | Status: DC

## 2017-01-26 NOTE — Patient Instructions (Signed)
Try using OTC Zostrix or lidocaine cream

## 2017-01-26 NOTE — Progress Notes (Signed)
Patient: Laura Nielsen Female    DOB: [birth year]   61 y.o.   MRN: [id number] Visit Date: 01/26/2017  Today's Provider: Lelon Huh, MD   Chief Complaint  Patient presents with  . Back Pain   Subjective:    HPI   Back Pain Pt comes in c/o back pain. The pain has been present for 6-7 days. Pt reports the pain is topical, and not muscular. Pt reports the pain is "burning", and feels like "my skin is raw". Pt denies rash. Pain is located on left upper back. Pain is about a 3/10, but can get as high as an 8-9/10. Pain is exacerbated by contact with clothing.  Allergies  Allergen Reactions  . Atorvastatin     Elevated blood sugar  . Omeprazole Nausea And Vomiting     Current Outpatient Prescriptions:  .  albuterol (PROVENTIL HFA) 108 (90 BASE) MCG/ACT inhaler, Inhale into the lungs., Disp: , Rfl:  .  allopurinol (ZYLOPRIM) 100 MG tablet, Take 1 tablet (100 mg total) by mouth daily., Disp: 30 tablet, Rfl: 5 .  aspirin 81 MG tablet, Take 81 mg by mouth daily. , Disp: , Rfl:  .  bisoprolol (ZEBETA) 10 MG tablet, Take 1 tablet (10 mg total) by mouth daily., Disp: 30 tablet, Rfl: 4 .  cholecalciferol (VITAMIN D) 1000 UNITS tablet, Take 2,000 Units by mouth daily. , Disp: , Rfl:  .  clopidogrel (PLAVIX) 75 MG tablet, Take 1 tablet (75 mg total) by mouth daily., Disp: 90 tablet, Rfl: 3 .  Cyanocobalamin (RA VITAMIN B-12 TR) 1000 MCG TBCR, Take 2 tablets by mouth daily. , Disp: , Rfl:  .  gabapentin (NEURONTIN) 300 MG capsule, Take 1 capsule (300 mg total) by mouth 3 (three) times daily., Disp: 90 capsule, Rfl: 5 .  Ibuprofen 200 MG CAPS, Take by mouth. , Disp: , Rfl:  .  lisinopril (PRINIVIL,ZESTRIL) 20 MG tablet, Take 1 tablet (20 mg total) by mouth daily., Disp: 90 tablet, Rfl: 3 .  loratadine (CLARITIN) 10 MG tablet, Take 10 mg by mouth daily as needed. , Disp: , Rfl:  .  lovastatin (MEVACOR) 40 MG tablet, Take 1 and 1/2 Tablets Daily., Disp: 45 tablet, Rfl: 5 .   oxyCODONE-acetaminophen (PERCOCET) 5-325 MG per tablet, Take 1 tablet by mouth every 8 (eight) hours as needed. , Disp: , Rfl:  .  triamcinolone cream (KENALOG) 0.1 %, TRIAMCINOLONE ACETONIDE, 0.1% (External Cream)  1 Cream Cream apply to hands bid for 0 days  Quantity: 60;  Refills: 0   Ordered :12-May-2011  [name] ;  Started 12-May-2011 Active Comments: DX: 692.9, Disp: , Rfl:  .  vitamin C (ASCORBIC ACID) 500 MG tablet, Take 500 mg by mouth. , Disp: , Rfl:  .  buPROPion (WELLBUTRIN SR) 150 MG 12 hr tablet, 1 tablet daily for 3 days, then 1 tablet twice daily. Stop smoking 14 days after starting medication, Disp: 60 tablet, Rfl: 6  Review of Systems  Constitutional: Positive for diaphoresis (hot flashes). Negative for activity change, appetite change, chills, fatigue, fever and unexpected weight change.  Skin: Negative for color change, pallor, rash and wound.    Social History  Substance Use Topics  . Smoking status: Current Every Day Smoker    Packs/day: 1.50    Years: 30.00    Types: Cigarettes  . Smokeless tobacco: Never Used     Comment: previously smoked over 2 ppd  . Alcohol use Yes  Comment: Rarely   Objective:   BP (!) 142/82 (BP Location: Left Arm, Patient Position: Sitting, Cuff Size: Large)   Pulse 80   Temp 97.9 F (36.6 C) (Oral)   Resp 16   Wt 269 lb (122 kg)   BMI 40.90 kg/m   Physical Exam  Tender and swollen over upper thoracic spine. Hypersensitive skin to left of midline. No rash or erythema.     Assessment & Plan:     1. Neuropathy (HCC)  - amitriptyline (ELAVIL) 25 MG tablet; Take 1 tablet (25 mg total) by mouth at bedtime.  Dispense: 30 tablet; Refill: 0  2. Muscle spasm of back  - amitriptyline (ELAVIL) 25 MG tablet; Take 1 tablet (25 mg total) by mouth at bedtime.  Dispense: 30 tablet; Refill: 0  Patient Instructions  Try using OTC Zostrix or lidocaine cream   Discussed possibility of shingles, but there are no skin lesions at this  time. Advised to call if any change in appearance of skin.     Patient seen and examined by Lelon Huh, MD, and note scribed by Renaldo Fiddler, CMA.  Lelon Huh, MD  [facility]

## 2017-02-19 ENCOUNTER — Other Ambulatory Visit: Payer: Self-pay | Admitting: Family Medicine

## 2017-02-19 DIAGNOSIS — I10 Essential (primary) hypertension: Secondary | ICD-10-CM

## 2017-02-19 DIAGNOSIS — G629 Polyneuropathy, unspecified: Secondary | ICD-10-CM

## 2017-02-19 DIAGNOSIS — M62830 Muscle spasm of back: Secondary | ICD-10-CM

## 2017-02-20 ENCOUNTER — Other Ambulatory Visit (INDEPENDENT_AMBULATORY_CARE_PROVIDER_SITE_OTHER): Payer: Self-pay | Admitting: Vascular Surgery

## 2017-02-20 DIAGNOSIS — Z9889 Other specified postprocedural states: Secondary | ICD-10-CM

## 2017-02-20 DIAGNOSIS — I739 Peripheral vascular disease, unspecified: Secondary | ICD-10-CM

## 2017-02-22 ENCOUNTER — Encounter (INDEPENDENT_AMBULATORY_CARE_PROVIDER_SITE_OTHER): Payer: Self-pay | Admitting: Vascular Surgery

## 2017-02-22 ENCOUNTER — Ambulatory Visit (INDEPENDENT_AMBULATORY_CARE_PROVIDER_SITE_OTHER): Payer: Medicare Other

## 2017-02-22 ENCOUNTER — Ambulatory Visit (INDEPENDENT_AMBULATORY_CARE_PROVIDER_SITE_OTHER): Payer: Medicare Other | Admitting: Vascular Surgery

## 2017-02-22 ENCOUNTER — Encounter (INDEPENDENT_AMBULATORY_CARE_PROVIDER_SITE_OTHER): Payer: Self-pay

## 2017-02-22 VITALS — BP 142/90 | HR 76 | Resp 16 | Ht 68.0 in | Wt 270.0 lb

## 2017-02-22 DIAGNOSIS — I10 Essential (primary) hypertension: Secondary | ICD-10-CM

## 2017-02-22 DIAGNOSIS — I70213 Atherosclerosis of native arteries of extremities with intermittent claudication, bilateral legs: Secondary | ICD-10-CM

## 2017-02-22 DIAGNOSIS — I714 Abdominal aortic aneurysm, without rupture, unspecified: Secondary | ICD-10-CM

## 2017-02-22 DIAGNOSIS — I6523 Occlusion and stenosis of bilateral carotid arteries: Secondary | ICD-10-CM

## 2017-02-22 DIAGNOSIS — Z9889 Other specified postprocedural states: Secondary | ICD-10-CM

## 2017-02-22 DIAGNOSIS — I739 Peripheral vascular disease, unspecified: Secondary | ICD-10-CM | POA: Diagnosis not present

## 2017-02-22 DIAGNOSIS — E7800 Pure hypercholesterolemia, unspecified: Secondary | ICD-10-CM

## 2017-02-22 DIAGNOSIS — I70219 Atherosclerosis of native arteries of extremities with intermittent claudication, unspecified extremity: Secondary | ICD-10-CM | POA: Insufficient documentation

## 2017-02-22 NOTE — Progress Notes (Signed)
MRN : [id number]  Laura Nielsen is a 61 y.o. ([birth year]) female who presents with chief complaint of  Chief Complaint  Patient presents with  . AAA    1 year follow up  .  History of Present Illness: The patient returns to the office for followup and review of the noninvasive studies. She is s/p endograft stenting of the aorta and iliac arteries in April, 2014.  There have been no interval changes in lower extremity symptoms. No interval shortening of the patient's claudication distance or development of rest pain symptoms. She notes that since the stent her walking has continued to be much better and is primarily limited by SOB and back pain. No new ulcers or wounds have occurred since the last visit.   She continues to have major issues with her lower back and neck but this is not new.  Otherwise there have been no significant changes to the patient's overall health care.  The patient denies amaurosis fugax or recent TIA symptoms. There are no recent neurological changes noted. The patient denies history of DVT, PE or superficial thrombophlebitis. The patient denies recent episodes of angina or shortness of breath.  ABI's Rt=0.97 and Lt=0.94, tibial signals are triphasic bilaterally (previous ABI's Rt=1.24 and Lt=1.13, tibial signals are triphasic bilaterally) Duplex US of the aorta and iliac arterial system shows the aortic stent is widely patent no recurrence of the stricture with triphasic tibial signals  Current Meds  Medication Sig  . albuterol (PROVENTIL HFA) 108 (90 BASE) MCG/ACT inhaler Inhale into the lungs.  Marland Kitchen allopurinol (ZYLOPRIM) 100 MG tablet Take 1 tablet (100 mg total) by mouth daily.  Marland Kitchen amitriptyline (ELAVIL) 25 MG tablet Take 1 tablet (25 mg total) by mouth at bedtime.  Marland Kitchen aspirin 81 MG tablet Take 81 mg by mouth daily.   . bisoprolol (ZEBETA) 10 MG tablet Take 1 tablet (10 mg total) by mouth daily.  . cholecalciferol (VITAMIN D) 1000 UNITS tablet Take 2,000 Units  by mouth daily.   . clopidogrel (PLAVIX) 75 MG tablet Take 1 tablet (75 mg total) by mouth daily.  . Cyanocobalamin (RA VITAMIN B-12 TR) 1000 MCG TBCR Take 2 tablets by mouth daily.   Marland Kitchen gabapentin (NEURONTIN) 300 MG capsule Take 1 capsule (300 mg total) by mouth 3 (three) times daily.  . Ibuprofen 200 MG CAPS Take by mouth.   Marland Kitchen lisinopril (PRINIVIL,ZESTRIL) 20 MG tablet Take 1 tablet (20 mg total) by mouth daily.  Marland Kitchen loratadine (CLARITIN) 10 MG tablet Take 10 mg by mouth daily as needed.   . lovastatin (MEVACOR) 40 MG tablet Take 1 and 1/2 Tablets Daily.  Marland Kitchen oxyCODONE-acetaminophen (PERCOCET) 5-325 MG per tablet Take 1 tablet by mouth every 8 (eight) hours as needed.   . triamcinolone cream (KENALOG) 0.1 % TRIAMCINOLONE ACETONIDE, 0.1% (External Cream)  1 Cream Cream apply to hands bid for 0 days  Quantity: 60;  Refills: 0   Ordered :12-May-2011  [name] ;  Started 12-May-2011 Active Comments: DX: 692.9  . vitamin C (ASCORBIC ACID) 500 MG tablet Take 500 mg by mouth.     Past Medical History:  Diagnosis Date  . Allergy   . Arthritis   . Asthma   . Depression   . Diabetes mellitus without complication ([place])   . Emphysema of lung ([place])   . GERD (gastroesophageal reflux disease)   . Hyperlipidemia   . Hypertension   . Osteoporosis   . Tobacco abuse counseling     Past  Surgical History:  Procedure Laterality Date  . Cardiac catheterization  3/210   70-80% stenosis RCA stent placed. started on Plavix  . [place]  . TUBAL LIGATION    . VAGINAL HYSTERECTOMY     Menometrorrhagia. Excessive bleeding  . vascular stent  03/28/2011   Dr. Delana Meyer, [facility]; Infrarenal    Social History Social History  Substance Use Topics  . Smoking status: Current Every Day Smoker    Packs/day: 1.50    Years: 30.00    Types: Cigarettes  . Smokeless tobacco: Never Used     Comment: previously smoked over 2 ppd  . Alcohol use Yes     Comment: Rarely    Family  History Family History  Problem Relation Age of Onset  . Hypertension Mother   . Coronary artery disease Mother   . Heart attack Mother     acute  . Alcohol abuse Father   . Depression Father   . Hypertension Father   . Heart attack Father 87    acute  . Alcohol abuse Sister   . Hyperlipidemia Sister   . Hypertension Sister   . Cancer Sister 9  . Heart attack Sister     x's 2  . Coronary artery disease Sister 15    x's 2  . Breast cancer Sister 41  No family history of bleeding/clotting disorders, porphyria or autoimmune disease   Allergies  Allergen Reactions  . Atorvastatin     Elevated blood sugar  . Omeprazole Nausea And Vomiting     REVIEW OF SYSTEMS (Negative unless checked)  Constitutional: '[]'$ Weight loss  '[]'$ Fever  '[]'$ Chills Cardiac: '[]'$ Chest pain   '[]'$ Chest pressure   '[]'$ Palpitations   '[]'$ Shortness of breath when laying flat   '[x]'$ Shortness of breath with exertion. Vascular:  '[x]'$ Pain in legs with walking   '[]'$ Pain in legs at rest  '[]'$ History of DVT   '[]'$ Phlebitis   '[x]'$ Swelling in legs   '[]'$ Varicose veins   '[]'$ Non-healing ulcers Pulmonary:   '[]'$ Uses home oxygen   '[]'$ Productive cough   '[]'$ Hemoptysis   '[]'$ Wheeze  '[]'$ COPD   '[]'$ Asthma Neurologic:  '[]'$ Dizziness   '[]'$ Seizures   '[]'$ History of stroke   '[]'$ History of TIA  '[]'$ Aphasia   '[]'$ Vissual changes   '[]'$ Weakness or numbness in arm   '[]'$ Weakness or numbness in leg Musculoskeletal:   '[]'$ Joint swelling   '[x]'$ Joint pain   '[x]'$ Low back pain Hematologic:  '[]'$ Easy bruising  '[]'$ Easy bleeding   '[]'$ Hypercoagulable state   '[]'$ Anemic Gastrointestinal:  '[]'$ Diarrhea   '[]'$ Vomiting  '[]'$ Gastroesophageal reflux/heartburn   '[]'$ Difficulty swallowing. Genitourinary:  '[]'$ Chronic kidney disease   '[]'$ Difficult urination  '[]'$ Frequent urination   '[]'$ Blood in urine Skin:  '[]'$ Rashes   '[]'$ Ulcers  Psychological:  '[]'$ History of anxiety   '[]'$  History of major depression.  Physical Examination  Vitals:   02/22/17 1051  BP: (!) 142/90  Pulse: 76  Resp: 16  Weight: 270 lb (122.5 kg)   Height: '5\' 8"'$  (1.727 m)   Body mass index is 41.05 kg/m. Gen: WD/WN, NAD Head: [place]/AT, No temporalis wasting.  Ear/Nose/Throat: Hearing grossly intact, nares w/o erythema or drainage, poor dentition Eyes: PER, EOMI, sclera nonicteric.  Neck: Supple, no masses.  No bruit or JVD.  Pulmonary:  Good air movement, clear to auscultation bilaterally, no use of accessory muscles.  Cardiac: RRR, normal S1, S2, no Murmurs. Vascular: 2+ edema bilaterally soft minimal skin changes Vessel Right Left  Radial Palpable Palpable  Ulnar Palpable Palpable  Brachial Palpable Palpable  Carotid Palpable Palpable  Femoral Palpable Palpable  Popliteal Palpable Palpable  PT Palpable Palpable  DP Palpable Palpable   Gastrointestinal: soft, non-distended. No guarding/no peritoneal signs.  Musculoskeletal: M/S 5/5 throughout.  No deformity or atrophy.  Neurologic: CN 2-12 intact. Pain and light touch intact in extremities.  Symmetrical.  Speech is fluent. Motor exam as listed above. Psychiatric: Judgment intact, Mood & affect appropriate for pt's clinical situation. Dermatologic: No rashes or ulcers noted.  No changes consistent with cellulitis. Lymph : No Cervical lymphadenopathy, no lichenification or skin changes of chronic lymphedema.  CBC Lab Results  Component Value Date   WBC 9.2 05/18/2016   HGB 16.5 (A) 03/31/2015   HCT 49.4 (H) 05/18/2016   MCV 85 05/18/2016   PLT 262 05/18/2016    BMET    Component Value Date/Time   NA 144 01/09/2017 0947   NA 139 03/28/2013 0354   K 4.4 01/09/2017 0947   K 3.7 03/28/2013 1811   CL 103 01/09/2017 0947   CL 107 03/28/2013 0354   CO2 24 01/09/2017 0947   CO2 27 03/28/2013 0354   GLUCOSE 112 (H) 01/09/2017 0947   GLUCOSE 112 (H) 03/28/2013 0354   BUN 13 01/09/2017 0947   BUN 8 03/28/2013 0354   CREATININE 0.78 01/09/2017 0947   CREATININE 0.57 (L) 03/28/2013 0354   CALCIUM 9.5 01/09/2017 0947   CALCIUM 8.2 (L) 03/28/2013 0354   GFRNONAA 83  01/09/2017 0947   GFRNONAA >60 03/28/2013 0354   GFRAA 96 01/09/2017 0947   GFRAA >60 03/28/2013 0354   CrCl cannot be calculated (Patient's most recent lab result is older than the maximum 21 days allowed.).  COAG Lab Results  Component Value Date   INR 1.1 03/28/2013    Radiology No results found.  Assessment/Plan 1. AAA (abdominal aortic aneurysm) without rupture (HCC) Recommend: Patient is status post successful endovascular repair of the AAA.   No further intervention is required at this time.   No endoleak is detected and the aneurysm sac is stable.  The patient will continue antiplatelet therapy as prescribed as well as aggressive management of hyperlipidemia. Exercise is again strongly encouraged.   However, endografts require continued surveillance with ultrasound or CT scan. This is mandatory to detect any changes that allow repressurization of the aneurysm sac.  The patient is informed that this would be asymptomatic.  The patient is reminded that lifelong routine surveillance is a necessity with an endograft. Patient will continue to follow-up at 6 month intervals with ultrasound of the aorta.  2. Bilateral carotid artery stenosis Recommend:  Given the patient's asymptomatic subcritical stenosis no further invasive testing or surgery at this time.  Continue antiplatelet therapy as prescribed Continue management of CAD, HTN and Hyperlipidemia Healthy heart diet,  encouraged exercise at least 4 times per week  3. Atherosclerosis of native artery of both lower extremities with intermittent claudication (HCC)  Recommend:  The patient has evidence of atherosclerosis of the lower extremities with claudication.  The patient does not voice lifestyle limiting changes at this point in time.  Noninvasive studies do not suggest clinically significant change.  No invasive studies, angiography or surgery at this time The patient should continue walking and begin a more  formal exercise program.  The patient should continue antiplatelet therapy and aggressive treatment of the lipid abnormalities  No changes in the patient's medications at this time  The patient should continue wearing graduated compression socks 10-15 mmHg strength to control the mild edema.    4. Benign  essential HTN Continue antihypertensive medications as already ordered, these medications have been reviewed and there are no changes at this time.   5. Hypercholesteremia Continue statin as ordered and reviewed, no changes at this time     Hortencia Pilar, MD  02/22/2017 11:00 AM

## 2017-03-06 DIAGNOSIS — I10 Essential (primary) hypertension: Secondary | ICD-10-CM | POA: Diagnosis not present

## 2017-03-06 DIAGNOSIS — E782 Mixed hyperlipidemia: Secondary | ICD-10-CM | POA: Diagnosis not present

## 2017-03-06 DIAGNOSIS — I2510 Atherosclerotic heart disease of native coronary artery without angina pectoris: Secondary | ICD-10-CM | POA: Diagnosis not present

## 2017-03-06 DIAGNOSIS — Z6841 Body Mass Index (BMI) 40.0 and over, adult: Secondary | ICD-10-CM | POA: Diagnosis not present

## 2017-03-06 DIAGNOSIS — I714 Abdominal aortic aneurysm, without rupture: Secondary | ICD-10-CM | POA: Diagnosis not present

## 2017-03-08 ENCOUNTER — Other Ambulatory Visit
Admission: RE | Admit: 2017-03-08 | Discharge: 2017-03-08 | Disposition: A | Discharge status: Home / Self Care | Payer: Medicare Other | Source: Ambulatory Visit | Attending: Pain Medicine | Admitting: Pain Medicine

## 2017-03-08 ENCOUNTER — Encounter: Payer: Self-pay | Admitting: Pain Medicine

## 2017-03-08 ENCOUNTER — Ambulatory Visit: Payer: Medicare Other | Attending: Pain Medicine | Admitting: Pain Medicine

## 2017-03-08 ENCOUNTER — Ambulatory Visit
Admission: RE | Admit: 2017-03-08 | Discharge: 2017-03-08 | Disposition: A | Discharge status: Home / Self Care | Payer: Medicare Other | Source: Ambulatory Visit | Attending: Pain Medicine | Admitting: Pain Medicine

## 2017-03-08 VITALS — BP 135/68 | HR 75 | Temp 96.6°F | Resp 16 | Ht 68.0 in | Wt 264.0 lb

## 2017-03-08 DIAGNOSIS — R51 Headache: Secondary | ICD-10-CM | POA: Insufficient documentation

## 2017-03-08 DIAGNOSIS — M4722 Other spondylosis with radiculopathy, cervical region: Secondary | ICD-10-CM | POA: Insufficient documentation

## 2017-03-08 DIAGNOSIS — M25559 Pain in unspecified hip: Secondary | ICD-10-CM

## 2017-03-08 DIAGNOSIS — M542 Cervicalgia: Secondary | ICD-10-CM | POA: Diagnosis not present

## 2017-03-08 DIAGNOSIS — M4802 Spinal stenosis, cervical region: Secondary | ICD-10-CM | POA: Insufficient documentation

## 2017-03-08 DIAGNOSIS — M545 Low back pain: Secondary | ICD-10-CM | POA: Diagnosis not present

## 2017-03-08 DIAGNOSIS — Z803 Family history of malignant neoplasm of breast: Secondary | ICD-10-CM | POA: Insufficient documentation

## 2017-03-08 DIAGNOSIS — G8929 Other chronic pain: Secondary | ICD-10-CM | POA: Diagnosis not present

## 2017-03-08 DIAGNOSIS — M1611 Unilateral primary osteoarthritis, right hip: Secondary | ICD-10-CM | POA: Diagnosis not present

## 2017-03-08 DIAGNOSIS — Z79891 Long term (current) use of opiate analgesic: Secondary | ICD-10-CM | POA: Insufficient documentation

## 2017-03-08 DIAGNOSIS — M160 Bilateral primary osteoarthritis of hip: Secondary | ICD-10-CM | POA: Diagnosis not present

## 2017-03-08 DIAGNOSIS — K219 Gastro-esophageal reflux disease without esophagitis: Secondary | ICD-10-CM | POA: Insufficient documentation

## 2017-03-08 DIAGNOSIS — G894 Chronic pain syndrome: Secondary | ICD-10-CM

## 2017-03-08 DIAGNOSIS — M25551 Pain in right hip: Secondary | ICD-10-CM | POA: Insufficient documentation

## 2017-03-08 DIAGNOSIS — Z9889 Other specified postprocedural states: Secondary | ICD-10-CM | POA: Insufficient documentation

## 2017-03-08 DIAGNOSIS — E1151 Type 2 diabetes mellitus with diabetic peripheral angiopathy without gangrene: Secondary | ICD-10-CM | POA: Insufficient documentation

## 2017-03-08 DIAGNOSIS — I714 Abdominal aortic aneurysm, without rupture: Secondary | ICD-10-CM | POA: Diagnosis not present

## 2017-03-08 DIAGNOSIS — I10 Essential (primary) hypertension: Secondary | ICD-10-CM | POA: Diagnosis not present

## 2017-03-08 DIAGNOSIS — Z9851 Tubal ligation status: Secondary | ICD-10-CM | POA: Insufficient documentation

## 2017-03-08 DIAGNOSIS — Z90710 Acquired absence of both cervix and uterus: Secondary | ICD-10-CM | POA: Insufficient documentation

## 2017-03-08 DIAGNOSIS — M4306 Spondylolysis, lumbar region: Secondary | ICD-10-CM

## 2017-03-08 DIAGNOSIS — M5416 Radiculopathy, lumbar region: Secondary | ICD-10-CM | POA: Diagnosis present

## 2017-03-08 DIAGNOSIS — J441 Chronic obstructive pulmonary disease with (acute) exacerbation: Secondary | ICD-10-CM | POA: Diagnosis not present

## 2017-03-08 DIAGNOSIS — F17210 Nicotine dependence, cigarettes, uncomplicated: Secondary | ICD-10-CM | POA: Diagnosis not present

## 2017-03-08 DIAGNOSIS — M48061 Spinal stenosis, lumbar region without neurogenic claudication: Secondary | ICD-10-CM | POA: Diagnosis not present

## 2017-03-08 DIAGNOSIS — Z8249 Family history of ischemic heart disease and other diseases of the circulatory system: Secondary | ICD-10-CM | POA: Insufficient documentation

## 2017-03-08 DIAGNOSIS — F329 Major depressive disorder, single episode, unspecified: Secondary | ICD-10-CM | POA: Diagnosis not present

## 2017-03-08 DIAGNOSIS — M1612 Unilateral primary osteoarthritis, left hip: Secondary | ICD-10-CM | POA: Diagnosis not present

## 2017-03-08 DIAGNOSIS — F1190 Opioid use, unspecified, uncomplicated: Secondary | ICD-10-CM | POA: Insufficient documentation

## 2017-03-08 DIAGNOSIS — M546 Pain in thoracic spine: Secondary | ICD-10-CM | POA: Diagnosis not present

## 2017-03-08 DIAGNOSIS — M47816 Spondylosis without myelopathy or radiculopathy, lumbar region: Secondary | ICD-10-CM | POA: Insufficient documentation

## 2017-03-08 DIAGNOSIS — G4486 Cervicogenic headache: Secondary | ICD-10-CM | POA: Insufficient documentation

## 2017-03-08 LAB — COMPREHENSIVE METABOLIC PANEL
ALT: 29 U/L (ref 14–54)
AST: 28 U/L (ref 15–41)
Albumin: 3.9 g/dL (ref 3.5–5.0)
Alkaline Phosphatase: 99 U/L (ref 38–126)
Anion gap: 10 (ref 5–15)
BUN: 11 mg/dL (ref 6–20)
CO2: 23 mmol/L (ref 22–32)
Calcium: 9.3 mg/dL (ref 8.9–10.3)
Chloride: 105 mmol/L (ref 101–111)
Creatinine, Ser: 0.62 mg/dL (ref 0.44–1.00)
GFR calc Af Amer: >60 mL/min (ref >60)
GFR calc non Af Amer: >60 mL/min (ref >60)
Glucose, Bld: 106 mg/dL — ABNORMAL HIGH (ref 65–99)
Potassium: 3.9 mmol/L (ref 3.5–5.1)
Sodium: 138 mmol/L (ref 135–145)
Total Bilirubin: 0.7 mg/dL (ref 0.3–1.2)
Total Protein: 7.3 g/dL (ref 6.5–8.1)

## 2017-03-08 LAB — MAGNESIUM: Magnesium: 1.8 mg/dL (ref 1.7–2.4)

## 2017-03-08 LAB — C-REACTIVE PROTEIN: CRP: <0.8 mg/dL (ref <1.0)

## 2017-03-08 LAB — SEDIMENTATION RATE: Sed Rate: 5 mm/hr (ref 0–30)

## 2017-03-08 LAB — VITAMIN B12: Vitamin B-12: 473 pg/mL (ref 180–914)

## 2017-03-08 NOTE — Progress Notes (Signed)
Patient's Name: Laura Nielsen  MRN: [id number]  Referring Provider: Birdie Sons, MD  DOB: September 09, 1956  PCP: Birdie Sons, MD  DOS: 03/08/2017  Note by: Kathlen Brunswick. Dossie Arbour, MD  Service setting: Ambulatory outpatient  Specialty: Interventional Pain Management  Location: [facility] (AMB) Pain Management Facility    Patient type: New Patient   Primary Reason(s) for Visit: Initial Patient Evaluation CC: Back Pain (upper and lower pain)  HPI  [name] is a 61 y.o. year old, female patient, who comes today for an initial evaluation. She has Allergic rhinitis; Aortic heart valve narrowing; Arthritis; Airway hyperreactivity; CAD in native artery; Carotid artery narrowing; Claudication ([place]); CAFL (chronic airflow limitation) ([place]); Smoking greater than 30 pack years; Narrowing of intervertebral disc space; Clinical depression; Diabetes mellitus, type 2 ([place]); Acid reflux; Hypercholesteremia; Benign essential HTN; Urinary incontinence; Malaise and fatigue; Pins and needles sensation; Obstructive apnea; Adiposity; Arthritis, degenerative; Abnormal Pap smear of vagina; Peripheral blood vessel disorder ([place]); B12 deficiency; Vitamin D deficiency; AAA (abdominal aortic aneurysm) without rupture ([place]); Ankle pain; Gout; Atherosclerosis of native arteries of extremity with intermittent claudication ([place]); Chronic pain syndrome; Long term current use of opiate analgesic; Long term prescription opiate use; Opiate use; Chronic low back pain (Location of Primary Source of Pain) (Bilateral) (L>R); Chronic neck pain (Location of Secondary source of pain) (Bilateral) (R>L); Cervicogenic headache (Right); Chronic upper back pain (Location of Tertiary source of pain) (Bilateral) (R>L); Chronic hip pain (Bilateral) (L>R); Osteoarthritis of hip (Bilateral) (L>R); Cervical central spinal stenosis; Cervical spondylosis with radiculopathy (Right) (C5); Lumbar spondylosis; and Lumbar spondylolysis on her problem list.. Her primarily  concern today is the Back Pain (upper and lower pain)  Pain Assessment: Self-Reported Pain Score: 3 /10             Reported level is compatible with observation.       Pain Location: Back Pain Orientation: Upper, Mid Pain Descriptors / Indicators: Aching, Constant, Radiating, Spasm, Tender, Tingling Pain Frequency: Intermittent  Onset and Duration: Present longer than 3 months Cause of pain: Unknown Severity: Getting worse, NAS-11 at its worse: 9/10, NAS-11 at its best: 2/10, NAS-11 now: 3/10 and NAS-11 on the average: 4/10 Timing: Not influenced by the time of the day, During activity or exercise, After activity or exercise and After a period of immobility Aggravating Factors: Bending, Climbing, Kneeling, Lifiting, Motion, Prolonged sitting, Prolonged standing, Squatting, Stooping , Twisting, Walking and Working Alleviating Factors: Cold packs, Hot packs, Lying down, Medications, Resting and Warm showers or baths Associated Problems: Depression, Dizziness, Fatigue, Inability to concentrate, Sadness, Spasms, Tingling, Weakness, Pain that wakes patient up and Pain that does not allow patient to sleep Quality of Pain: Aching, Deep, Disabling, Distressing, Exhausting, Feeling of weight, Nagging, Sharp, Shooting, Sickening and Throbbing Previous Examinations or Tests: CT scan, MRI scan, Nerve block and Psychiatric evaluation Previous Treatments: Narcotic medications  The patient comes into the clinics today for a chronic pain management evaluation. This patient was last seen by me in 2016. She comes in today clinics today indicating that her primary pain is that of the lower back with the left side being worse than the right. She denies having had back surgery, nerve blocks, but she does admit to some Physical Therapy in [place] around 2007 2 2008. She does not have any recent x-rays or MRIs of her lumbar spine. The next area pain is that of the neck where the pain is in the posterior aspect  and bilateral with the right side  being worst on the left. The patient also indicates having right upper extremity pain going down to the forearm following a C5 dermatomal distribution. She denies any cervical surgery, nerve blocks, physical therapy, or any recent MRIs or x-rays. The next area of pain is the mid back where the pain is bilateral with the right being worst on the left. Patient denies any surgeries, nerve blocks, recent x-rays or MRIs, or any physical therapy. The patient's next area pain is that of the hips with the left being worst on the right. She denies any surgery or physical therapy as well as any recent x-rays or MRIs. She does indicate having had some nerve blocks that were actually done by me, around 2016. In addition, the patient complains of right-sided headaches that seem to follow the distribution of the lesser occipital nerve right around her ear. She denies any surgeries, nerve blocks, physical therapy, or recent x-rays or MRIs.  The patient indicates controlling her pain with nonsteroidal anti-inflammatory drugs, heat,, and occasionally she will take and oxycodone. She actually comes in with the same oxycodone bottle that I prescribed for her on 02/11/2015. At the time I wrote for 120 tablets to be taken 1 tablet by mouth 4 times a day when necessary for pain.  In reviewing her prior x-rays and MRIs done at this and other nearby institutions, it would seem that she has multilevel cervical spinal stenosis as well as spondylolisthesis and spondylolysis in the lumbar region. All of these MRIs and CT scans were old  Today I took the time to provide the patient with information regarding my pain practice. The patient was informed that my practice is divided into two sections: an interventional pain management section, as well as a completely separate and distinct medication management section. I explained that I have procedure days for my interventional therapies, and evaluation days  for follow-ups and medication management. Because of the amount of documentation required during both, they are kept separated. This means that there is the possibility that she may be scheduled for a procedure on one day, and medication management the next. I have also informed her that because of staffing and facility limitations, I no longer take patients for medication management only. To illustrate the reasons for this, I gave the patient the example of surgeons, and how inappropriate it would be to refer a patient to his/her care, just to write for the post-surgical antibiotics on a surgery done by a different surgeon.   Because interventional pain management is my board-certified specialty, the patient was informed that joining my practice means that they are open to any and all interventional therapies. I made it clear that this does not mean that they will be forced to have any procedures done. What this means is that I believe interventional therapies to be essential part of the diagnosis and proper management of chronic pain conditions. Therefore, patients not interested in these interventional alternatives will be better served under the care of a different practitioner.  The patient was also made aware of my Comprehensive Pain Management Safety Guidelines where by joining my practice, they limit all of their nerve blocks and joint injections to those done by our practice, for as long as we are retained to manage their care.   Historic Controlled Substance Pharmacotherapy Review  PMP and historical list of controlled substances: Oxycodone IR 5 mg by mouth 4 times a day last written for the patient on 02/11/2015. Highest analgesic regimen found: Oxycodone/APAP 5/325, 5  tablets per day. Most recent analgesic: Oxycodone IR 5 mg 1 to twice a week. Highest recorded MME/day: 42.86 mg/day MME/day: 1.5 mg/day Medications: The patient did not bring the medication(s) to the appointment, as requested in  our "New Patient Package" Pharmacodynamics: Desired effects: Analgesia: The patient reports >50% benefit. Reported improvement in function: The patient reports medication allows her to accomplish basic ADLs. Clinically meaningful improvement in function (CMIF): Sustained CMIF goals met Perceived effectiveness: Described as relatively effective, allowing for increase in activities of daily living (ADL) Undesirable effects: Side-effects or Adverse reactions: None reported Historical Monitoring: The patient  reports that she does not use drugs.. List of all UDS Test(s): No results found for: MDMA, COCAINSCRNUR, PCPSCRNUR, PCPQUANT, CANNABQUANT, THCU, [place] List of all Serum Drug Screening Test(s):  No results found for: AMPHSCRSER, BARBSCRSER, BENZOSCRSER, COCAINSCRSER, PCPSCRSER, PCPQUANT, THCSCRSER, CANNABQUANT, OPIATESCRSER, OXYSCRSER, PROPOXSCRSER Historical Background Evaluation: [place] PDMP: Six (6) year initial data search conducted.             [place] Department of public safety, offender search: Editor, commissioning Information) Non-contributory Risk Assessment Profile: Aberrant behavior: None observed or detected today Risk factors for fatal opioid overdose: None identified today Fatal overdose hazard ratio (HR): Calculation deferred Non-fatal overdose hazard ratio (HR): Calculation deferred Risk of opioid abuse or dependence: 0.7-3.0% with doses ? 36 MME/day and 6.1-26% with doses ? 120 MME/day. Substance use disorder (SUD) risk level: Pending results of Medical Psychology Evaluation for SUD Opioid risk tool (ORT) (Total Score): 4  ORT Scoring interpretation table:  Score <3 = Low Risk for SUD  Score between 4-7 = Moderate Risk for SUD  Score >8 = High Risk for Opioid Abuse   PHQ-2 Depression Scale:  Total score: 0  PHQ-2 Scoring interpretation table: (Score and probability of major depressive disorder)  Score 0 = No depression  Score 1 = 15.4% Probability  Score 2 = 21.1% Probability  Score 3  = 38.4% Probability  Score 4 = 45.5% Probability  Score 5 = 56.4% Probability  Score 6 = 78.6% Probability   PHQ-9 Depression Scale:  Total score: 0  PHQ-9 Scoring interpretation table:  Score 0-4 = No depression  Score 5-9 = Mild depression  Score 10-14 = Moderate depression  Score 15-19 = Moderately severe depression  Score 20-27 = Severe depression (2.4 times higher risk of SUD and 2.89 times higher risk of overuse)   Pharmacologic Plan: Pending ordered tests and/or consults  Meds  The patient has a current medication list which includes the following prescription(s): albuterol, allopurinol, amitriptyline, aspirin, bisoprolol, bupropion, cholecalciferol, clopidogrel, cyanocobalamin, gabapentin, ibuprofen, lisinopril, loratadine, lovastatin, oxycodone-acetaminophen, triamcinolone cream, vitamin c, and bupropion.  Current Outpatient Prescriptions on File Prior to Visit  Medication Sig  . albuterol (PROVENTIL HFA) 108 (90 BASE) MCG/ACT inhaler Inhale into the lungs.  Marland Kitchen allopurinol (ZYLOPRIM) 100 MG tablet Take 1 tablet (100 mg total) by mouth daily.  Marland Kitchen amitriptyline (ELAVIL) 25 MG tablet Take 1 tablet (25 mg total) by mouth at bedtime.  Marland Kitchen aspirin 81 MG tablet Take 81 mg by mouth daily.   . bisoprolol (ZEBETA) 10 MG tablet Take 1 tablet (10 mg total) by mouth daily.  . cholecalciferol (VITAMIN D) 1000 UNITS tablet Take 2,000 Units by mouth daily.   . clopidogrel (PLAVIX) 75 MG tablet Take 1 tablet (75 mg total) by mouth daily.  . Cyanocobalamin (RA VITAMIN B-12 TR) 1000 MCG TBCR Take 2 tablets by mouth daily.   Marland Kitchen gabapentin (NEURONTIN) 300 MG capsule Take 1 capsule (  300 mg total) by mouth 3 (three) times daily.  . Ibuprofen 200 MG CAPS Take by mouth.   Marland Kitchen lisinopril (PRINIVIL,ZESTRIL) 20 MG tablet Take 1 tablet (20 mg total) by mouth daily.  Marland Kitchen loratadine (CLARITIN) 10 MG tablet Take 10 mg by mouth daily as needed.   . lovastatin (MEVACOR) 40 MG tablet Take 1 and 1/2 Tablets Daily.  Marland Kitchen  oxyCODONE-acetaminophen (PERCOCET) 5-325 MG per tablet Take 1 tablet by mouth every 8 (eight) hours as needed.   . triamcinolone cream (KENALOG) 0.1 % TRIAMCINOLONE ACETONIDE, 0.1% (External Cream)  1 Cream Cream apply to hands bid for 0 days  Quantity: 60;  Refills: 0   Ordered :12-May-2011  [name] ;  Started 12-May-2011 Active Comments: DX: 692.9  . vitamin C (ASCORBIC ACID) 500 MG tablet Take 500 mg by mouth.   Marland Kitchen buPROPion (WELLBUTRIN SR) 150 MG 12 hr tablet 1 tablet daily for 3 days, then 1 tablet twice daily. Stop smoking 14 days after starting medication   No current facility-administered medications on file prior to visit.    Imaging Review  Cervical Imaging: Cervical MR wo contrast:  Results for orders placed in visit on 07/25/13  MR C Spine Ltd W/O Cm   Narrative  PRIOR REPORT IMPORTED FROM AN EXTERNAL SYSTEM *   PRIOR REPORT IMPORTED FROM THE [facility] WORKFLOW SYSTEM   REASON FOR EXAM:    cervical spondylosis  with myelopathy  COMMENTS:   PROCEDURE:     MMR - MMR CERVICAL SPINE WO CONT  - Jul 25 2013  2:01PM   RESULT:     Cervical spine MRI is performed. Some of the images were  repeated because of patient motion artifact. Study shows severe  degenerative  changes essentially from C4 through T1 with multilevel degenerative disc  narrowing, diffuse disc protrusion and spurring causing severe spinal  canal  narrowing and at the C4 and C4-C5 level with some mild increased T2 signal  in the cord at this level. There is severe bilateral foraminal narrowing  at  this level. Similar changes are seen at the C5-C5-C6 level with moderately  severe spinal canal narrowing and bilateral foraminal narrowing at C6-C7.  There does not appear to be subluxation. There is a heterogeneous marrow  signal pattern which could be from degenerative change. There does not  appear to be severe edema to suggest an acute fracture or compression  deformity. Degenerative disc protrusion and  narrowing is seen in the upper  thoracic spine as well. This is seen at T3-T4 on the sagittal images  causing  anterior thecal sac deformity and spinal cord deformity possibly with some  increased signal. A definite syrinx is not evident.   IMPRESSION:      Multilevel severe spinal canal and foraminal stenosis  secondary to degenerative disc protrusion and bony hypertrophic changes.  Surgical consultation is recommended. Changes or not completely evaluated  in  the upper thoracic region on the axial images.   Dictation Site: 1       Cervical DG 2-3 views:  Results for orders placed in visit on 04/20/11  DG Cervical Spine 2 or 3 views   Narrative * PRIOR REPORT IMPORTED FROM AN EXTERNAL SYSTEM *   PRIOR REPORT IMPORTED FROM THE [facility] WORKFLOW SYSTEM   REASON FOR EXAM:    numbness of left arm  COMMENTS:   PROCEDURE:     KDR - KDXR C-SPINE AP AND LATERAL  - Apr 20 2011 12:27PM   RESULT:  Images of the cervical spine show degenerative disc space  narrowing at C4-C5, C5-C6, C6-C7 and C7-T1 with pronounced hypertrophic  degenerative endplate spurring especially anteriorly at C4-C5, C5-C6 and  to  a lesser extent at C6-C7. Facet hypertrophy, bilateral atherosclerotic  calcification in the carotid bifurcation regions, and a normal  atlantoaxial  alignment are noted. The odontoid appears intact.   IMPRESSION:  1. Advanced cervical spine degenerative change. The foramina were not  evaluated. Given the extent of facet hypertrophy and hypertrophic  degenerative endplate spurring some uncinate spurring causing foraminal  narrowing may certainly be present. MRI may be beneficial for further  assessment.  2. Carotid artery atherosclerotic disease. Correlate for bruits and  consider  screening carotid Doppler interrogation.   Thank you for the opportunity to contribute to the care of your patient.       Lumbosacral Imaging: Lumbar CT wo contrast:  Results for orders placed in  visit on 04/17/13  CT Lumbar Spine Wo Contrast   Narrative * PRIOR REPORT IMPORTED FROM AN EXTERNAL SYSTEM *   PRIOR REPORT IMPORTED FROM THE [facility] WORKFLOW SYSTEM   REASON FOR EXAM:    Neck pain and cervical radiculitis and Low back pain  and  Lumbar Radiculitis  COMMENTS:   PROCEDURE:     KCT - KCT LUMBAR SPINE WO CONTRAST  - Apr 17 2013  3:56PM   RESULT:   Technique: Multiplanar imaging of the lumbar spine was obtained utilizing  helical 3 mm acquisition and bone as well as soft tissue reconstruction  algorithm.   Findings: Mild dextroscoliosis is identified within the lumbar spine.  Severe  Modic endplate changes are identified at the T12-L1 level. Multilevel  vacuum  discs are identified throughout the lumbar spine. There is multilevel disc  space narrowing.   At the T12-L1 level endplate hypertrophic spurring is appreciated which  causes partial effacement of the anterior CSF space. This results in mild  thecal sac narrowing. There is evidence of mild neural foraminal narrowing  on the right and possible exiting nerve root compromise cannot be  excluded.   At the L1-L2 level endplate hypertrophic spurring as well as facet  hypertrophy and ligamentum flavum hypertrophy contribute to mild to  moderate  thecal sac narrowing. There does not appear to be evidence of neural  foraminal narrowing.   At the L2-L3 disc space level facet hypertrophy, endplate hypertrophic  spurring as well as a broad-based disc bulge contributes to moderate  thecal  sac stenosis. There is bilateral mild neural foraminal narrowing and  exiting  nerve root compromise cannot be excluded.   At the L3-L4 level there are multifactorial mild to moderate thecal  stenosis  appreciated secondary to facet hypertrophy, mild endplate hypertrophic  spurring and ligamentum flavum hypertrophy. There does not appear to be  significant thecal sac narrowing.   At the L4-L5 disc space level mild to moderate  multifactorial canal  stenosis  is appreciated secondary to facet and ligamentum flavum hypertrophy as  well  as endplate hypertrophic spurring. There is a component of a disc bulge  which demonstrates lateralization to the right and left and contributes to  bilateral mild neural foraminal narrowing on the right and mild to  moderate  on the left. Exiting nerve root compromise with a component of compression  on the left is of diagnostic consideration.   At the L5-S1 level there is no evidence of canal stenosis or neural  foraminal narrowing. Mild degenerative disc disease changes are  appreciated.   IMPRESSION:   1. Multilevel spondylolysis as well as multilevel degenerative disc  disease  changes. There are areas of canal stenosis as well as neural foraminal  narrowing as described above. Areas of exiting nerve root compromise as  well  as an area of possible nerve root compression at the L4-L5 level on the  left  is a diagnostic consideration. If clinically warranted, surgical  consultation recommended.   Thank you for the opportunity to contribute to the care of your patient.       Lumbar DG 2-3 views:  Results for orders placed in visit on 03/14/12  DG Lumbar Spine 2-3 Views   Narrative * PRIOR REPORT IMPORTED FROM AN EXTERNAL SYSTEM *   PRIOR REPORT IMPORTED FROM THE [facility] [place] EXAM:    pain  COMMENTS:   PROCEDURE:     KDR - KDXR LUMBAR SPINE AP AND LATERAL  - Mar 14 2012  12:40PM   RESULT:     No acute vertebral body fracture is seen. There is noted  central  depression of the superior vertebral plate of L1 consistent with chronic  change and with there being associated anterior and posterior spur  formation. The vertebral body heights otherwise are well maintained. The  vertebral body alignment is normal. There is narrowing of the L4-L5 and  L5-S1 intervertebral disc spaces consistent with disc disease. This could  be  further  evaluated by MR if clinically indicated.   Also noted is an assimilation joint on the right at L5. This can be  associated with pain. The pedicles are bilaterally intact. There is  atherosclerotic calcification in the abdominal aorta.   IMPRESSION:  1. No acute fracture is seen.  2. There is an old appearing central compression deformity of L1.  3. There is narrowing of the L4-L5 and L5-S1 intervertebral disc spaces  consistent with disc disease. This could be further evaluated by MR if  clinically indicated.  4. There is an assimilation joint on the right involving the transverse  process of L5 and the sacrum. Such joints can be associated with pain.   Thank you for the opportunity to contribute to the care of your patient.       Knee Imaging: Knee-R DG 1-2 views:  Results for orders placed in visit on 05/07/09  DG Knee 1-2 Views Right   Narrative * PRIOR REPORT IMPORTED FROM AN EXTERNAL SYSTEM *   PRIOR REPORT IMPORTED FROM THE [facility] WORKFLOW SYSTEM   REASON FOR EXAM:    KNEE PAIN, WEIGHT BEARING  COMMENTS:   PROCEDURE:     KDR - KDXR KNEE RIGHT AP AND LATERAL  - May 07 2009 10:04AM   RESULT:     Images of the right knee demonstrate joint space narrowing and  hypertrophic marginal spurring. There is no fracture or bony destruction.   IMPRESSION:      No acute bony abnormality.       Knee-L DG 1-2 views:  Results for orders placed in visit on 05/07/09  DG Knee 1-2 Views Left   Narrative * PRIOR REPORT IMPORTED FROM AN EXTERNAL SYSTEM *   PRIOR REPORT IMPORTED FROM THE [facility] WORKFLOW SYSTEM   REASON FOR EXAM:    KNEE PAIN, WEIGHT BEARING  COMMENTS:   PROCEDURE:     KDR - KDXR KNEE LEFT AP AND LATERAL  - May 07 2009 10:03AM   RESULT:     Images of  the left knee show joint space narrowing with  hypertrophic spurring. No fracture is evident.   IMPRESSION:      Please see above.       Note: Available results from prior imaging studies were reviewed.        ROS   Cardiovascular History: Daily Aspirin intake, Hypertension and Heart catheterization Pulmonary or Respiratory History: Lung problems, Asthma, Smoker, Snoring , Bronchitis and Sleep apnea Neurological History: Negative for epilepsy, stroke, urinary or fecal inontinence, spina bifida or tethered cord syndrome Review of Past Neurological Studies:  Results for orders placed or performed in visit on 10/04/12  CT Head W Wo Contrast   Narrative   * PRIOR REPORT IMPORTED FROM AN EXTERNAL SYSTEM *   PRIOR REPORT IMPORTED FROM THE [facility] WORKFLOW SYSTEM   REASON FOR EXAM:    persistent HA  tingling nunbness lt side jaw   dizziness  knot lt side head  COMMENTS:   PROCEDURE:     KCT - KCT HEAD W/WO CONTRAST  - Oct 04 2012  8:50AM   RESULT:   TECHNIQUE: CT of the brain without and with contrast is performed. The  patient received 50 ml of Isovue-300 iodinated intravenous contrast for  the  examination.   Comparison is made to the study from 27 June 2008.   FINDINGS: Noncontrast images show the ventricles and sulci appear to be  within normal limits for the patient's age. There is no intracranial  hemorrhage, mass effect or midline shift. No territorial infarct is  appreciated. Following contrast administration, there does not appear to  be  evidence of abnormal enhancement. The calvarium is intact without a lytic  or  sclerotic mass.   IMPRESSION:       No acute intracranial abnormality. No significant  interval  change. Follow-up MRI should be considered for further investigation given  the nature of the patient's symptoms.   Dictation Site: 1   Thank you for this opportunity to contribute to the care of your patient.       Psychological-Psychiatric History: Negative for anxiety, depression, schizophrenia, bipolar disorders or suicidal ideations or attempts Gastrointestinal History: Reflux or heatburn Genitourinary History: Negative for nephrolithiasis, hematuria, renal failure  or chronic kidney disease Hematological History: Brusing easily Endocrine History: Negative for diabetes or thyroid disease Rheumatologic History: Osteoarthritis and Rheumatoid arthritis Musculoskeletal History: Negative for myasthenia gravis, muscular dystrophy, multiple sclerosis or malignant hyperthermia Work History: Disabled  Allergies  Ms. Coluccio is allergic to atorvastatin and omeprazole.  Laboratory Chemistry  Inflammation Markers Lab Results  Component Value Date   CRP <0.8 03/08/2017   ESRSEDRATE 5 03/08/2017   (CRP: Acute Phase) (ESR: Chronic Phase) Renal Function Markers Lab Results  Component Value Date   BUN 11 03/08/2017   CREATININE 0.62 03/08/2017   GFRAA >60 03/08/2017   GFRNONAA >60 03/08/2017   Hepatic Function Markers Lab Results  Component Value Date   AST 28 03/08/2017   ALT 29 03/08/2017   ALBUMIN 3.9 03/08/2017   ALKPHOS 99 03/08/2017   Electrolytes Lab Results  Component Value Date   NA 138 03/08/2017   K 3.9 03/08/2017   CL 105 03/08/2017   CALCIUM 9.3 03/08/2017   MG 1.8 03/08/2017   Neuropathy Markers Lab Results  Component Value Date   VITAMINB12 473 03/08/2017   Bone Pathology Markers Lab Results  Component Value Date   ALKPHOS 99 03/08/2017   VD25OH 21.9 (L) 01/09/2017   25OHVITD1 24 (L) 03/08/2017   25OHVITD2  8.2 03/08/2017   25OHVITD3 16 03/08/2017   CALCIUM 9.3 03/08/2017   Coagulation Parameters Lab Results  Component Value Date   INR 1.1 03/28/2013   LABPROT 14.3 03/28/2013   APTT 28.2 03/28/2013   PLT 262 05/18/2016   Cardiovascular Markers Lab Results  Component Value Date   HGB 16.5 (A) 03/31/2015   HCT 49.4 (H) 05/18/2016   Note: Lab results reviewed.  PFSH  Drug: Ms. Reffitt  reports that she does not use drugs. Alcohol:  reports that she drinks alcohol. Tobacco:  reports that she has been smoking Cigarettes.  She has a 45.00 pack-year smoking history. She has never used smokeless tobacco. Medical:   has a past medical history of Allergy; Arthritis; Asthma; Depression; Diabetes mellitus without complication ([place]); Emphysema of lung ([place]); GERD (gastroesophageal reflux disease); Hyperlipidemia; Hypertension; Osteoporosis; and Tobacco abuse counseling. Family: family history includes Alcohol abuse in her father and sister; Breast cancer (age of onset: 48) in her sister; Cancer (age of onset: 20) in her sister; Coronary artery disease in her mother; Coronary artery disease (age of onset: 59) in her sister; Depression in her father; Heart attack in her mother and sister; Heart attack (age of onset: 54) in her father; Hyperlipidemia in her sister; Hypertension in her father, mother, and sister.  Past Surgical History:  Procedure Laterality Date  . Cardiac catheterization  3/210   70-80% stenosis RCA stent placed. started on Plavix  . [place]  . TUBAL LIGATION    . VAGINAL HYSTERECTOMY     Menometrorrhagia. Excessive bleeding  . vascular stent  03/28/2011   Dr. Delana Meyer, [facility]; Infrarenal   Active Ambulatory Problems    Diagnosis Date Noted  . Allergic rhinitis 10/25/2007  . Aortic heart valve narrowing 01/31/2013  . Arthritis 05/14/2015  . Airway hyperreactivity 10/25/2007  . CAD in native artery 03/17/2009  . Carotid artery narrowing 05/14/2015  . Claudication ([place]) 05/14/2015  . CAFL (chronic airflow limitation) ([place]) 05/14/2015  . Smoking greater than 30 pack years 10/25/2007  . Narrowing of intervertebral disc space 10/25/2007  . Clinical depression 05/14/2015  . Diabetes mellitus, type 2 ([place]) 02/05/2009  . Acid reflux 05/14/2015  . Hypercholesteremia 06/24/2008  . Benign essential HTN 10/25/2007  . Urinary incontinence 05/14/2015  . Malaise and fatigue 09/26/2012  . Pins and needles sensation 05/14/2015  . Obstructive apnea 05/14/2015  . Adiposity 05/14/2015  . Arthritis, degenerative 10/25/2007  . Abnormal Pap smear of vagina 05/14/2015  . Peripheral blood  vessel disorder ([place]) 05/14/2015  . B12 deficiency 05/14/2015  . Vitamin D deficiency 05/14/2015  . AAA (abdominal aortic aneurysm) without rupture ([place]) 05/26/2014  . Ankle pain 11/17/2015  . Gout 02/16/2016  . Atherosclerosis of native arteries of extremity with intermittent claudication ([place]) 02/22/2017  . Chronic pain syndrome 03/08/2017  . Long term current use of opiate analgesic 03/08/2017  . Long term prescription opiate use 03/08/2017  . Opiate use 03/08/2017  . Chronic low back pain (Location of Primary Source of Pain) (Bilateral) (L>R) 03/08/2017  . Chronic neck pain (Location of Secondary source of pain) (Bilateral) (R>L) 03/08/2017  . Cervicogenic headache (Right) 03/08/2017  . Chronic upper back pain (Location of Tertiary source of pain) (Bilateral) (R>L) 03/08/2017  . Chronic hip pain (Bilateral) (L>R) 03/08/2017  . Osteoarthritis of hip (Bilateral) (L>R) 03/08/2017  . Cervical central spinal stenosis 03/08/2017  . Cervical spondylosis with radiculopathy (Right) (C5) 03/08/2017  . Lumbar spondylosis 03/08/2017  . Lumbar spondylolysis 03/08/2017  Resolved Ambulatory Problems    Diagnosis Date Noted  . Acute exacerbation of chronic obstructive airways disease ([place]) 05/14/2015  . Chronic obstructive pulmonary emphysema ([place]) 05/14/2015   Past Medical History:  Diagnosis Date  . Allergy   . Arthritis   . Asthma   . Depression   . Diabetes mellitus without complication ([place])   . Emphysema of lung ([place])   . GERD (gastroesophageal reflux disease)   . Hyperlipidemia   . Hypertension   . Osteoporosis   . Tobacco abuse counseling    Constitutional Exam  General appearance: Well nourished, well developed, and well hydrated. In no apparent acute distress Vitals:   03/08/17 1103  BP: 135/68  Pulse: 75  Resp: 16  Temp: (!) 96.6 F (35.9 C)  TempSrc: Oral  SpO2: 98%  Weight: 264 lb (119.7 kg)  Height: 5' 8"  (1.727 m)   BMI Assessment: Estimated body mass index  is 40.14 kg/m as calculated from the following:   Height as of this encounter: 5' 8"  (1.727 m).   Weight as of this encounter: 264 lb (119.7 kg).  BMI interpretation table: BMI level Category Range association with higher incidence of chronic pain  <18 kg/m2 Underweight   18.5-24.9 kg/m2 Ideal body weight   25-29.9 kg/m2 Overweight Increased incidence by 20%  30-34.9 kg/m2 Obese (Class I) Increased incidence by 68%  35-39.9 kg/m2 Severe obesity (Class II) Increased incidence by 136%  >40 kg/m2 Extreme obesity (Class III) Increased incidence by 254%   BMI Readings from Last 4 Encounters:  03/08/17 40.14 kg/m  02/22/17 41.05 kg/m  01/26/17 40.90 kg/m  01/03/17 41.36 kg/m   Wt Readings from Last 4 Encounters:  03/08/17 264 lb (119.7 kg)  02/22/17 270 lb (122.5 kg)  01/26/17 269 lb (122 kg)  01/03/17 272 lb (123.4 kg)  Psych/Mental status: Alert, oriented x 3 (person, place, & time)       Eyes: PERLA Respiratory: No evidence of acute respiratory distress  Cervical Spine Exam  Inspection: No masses, redness, or swelling Alignment: Symmetrical Functional ROM: Unrestricted ROM Stability: No instability detected Muscle strength & Tone: Functionally intact Sensory: Unimpaired Palpation: No palpable anomalies  Upper Extremity (UE) Exam    Side: Right upper extremity  Side: Left upper extremity  Inspection: No masses, redness, swelling, or asymmetry. No contractures  Inspection: No masses, redness, swelling, or asymmetry. No contractures  Functional ROM: Unrestricted ROM          Functional ROM: Unrestricted ROM          Muscle strength & Tone: Functionally intact  Muscle strength & Tone: Functionally intact  Sensory: Unimpaired  Sensory: Unimpaired  Palpation: No palpable anomalies  Palpation: No palpable anomalies  Specialized Test(s): Deferred         Specialized Test(s): Deferred          Thoracic Spine Exam  Inspection: No masses, redness, or swelling Alignment:  Symmetrical Functional ROM: Unrestricted ROM Stability: No instability detected Sensory: Unimpaired Muscle strength & Tone: No palpable anomalies  Lumbar Spine Exam  Inspection: No masses, redness, or swelling Alignment: Symmetrical Functional ROM: Decreased ROM Stability: No instability detected Muscle strength & Tone: Functionally intact Sensory: Movement-associated pain Palpation: Complains of area being tender to palpation Provocative Tests: Lumbar Hyperextension and rotation test: Positive bilaterally for facet joint pain. Patrick's Maneuver: evaluation deferred today              Gait & Posture Assessment  Ambulation: Unassisted Gait: Relatively normal for age and  body habitus Posture: WNL   Lower Extremity Exam    Side: Right lower extremity  Side: Left lower extremity  Inspection: No masses, redness, swelling, or asymmetry. No contractures  Inspection: No masses, redness, swelling, or asymmetry. No contractures  Functional ROM: Unrestricted ROM          Functional ROM: Unrestricted ROM          Muscle strength & Tone: Functionally intact  Muscle strength & Tone: Functionally intact  Sensory: Unimpaired  Sensory: Unimpaired  Palpation: No palpable anomalies  Palpation: No palpable anomalies   Assessment  Primary Diagnosis & Pertinent Problem List: The primary encounter diagnosis was Chronic pain syndrome. Diagnoses of Chronic low back pain (Location of Primary Source of Pain) (Bilateral) (L>R), Lumbar spondylolysis, Lumbar spondylosis, Chronic neck pain (Location of Secondary source of pain) (Bilateral) (R>L), Cervicogenic headache (Right), Cervical central spinal stenosis, Cervical spondylosis with radiculopathy (Right) (C5), Chronic upper back pain (Location of Tertiary source of pain) (Bilateral) (R>L), Chronic hip pain (Bilateral) (L>R), Osteoarthritis of hip (Bilateral) (L>R), Long term current use of opiate analgesic, Long term prescription opiate use, and Opiate use  were also pertinent to this visit.  Visit Diagnosis: 1. Chronic pain syndrome   2. Chronic low back pain (Location of Primary Source of Pain) (Bilateral) (L>R)   3. Lumbar spondylolysis   4. Lumbar spondylosis   5. Chronic neck pain (Location of Secondary source of pain) (Bilateral) (R>L)   6. Cervicogenic headache (Right)   7. Cervical central spinal stenosis   8. Cervical spondylosis with radiculopathy (Right) (C5)   9. Chronic upper back pain (Location of Tertiary source of pain) (Bilateral) (R>L)   10. Chronic hip pain (Bilateral) (L>R)   11. Osteoarthritis of hip (Bilateral) (L>R)   12. Long term current use of opiate analgesic   13. Long term prescription opiate use   14. Opiate use    Plan of Care  Initial treatment plan:  Please be advised that as per protocol, today's visit has been an evaluation only. We have not taken over the patient's controlled substance management.  Problem-specific plan: No problem-specific Assessment & Plan notes found for this encounter.  Ordered Lab-work, Procedure(s), Referral(s), & Consult(s): Orders Placed This Encounter  Procedures  . MR LUMBAR SPINE WO CONTRAST  . MR CERVICAL SPINE WO CONTRAST  . DG HIP UNILAT W OR W/O PELVIS 2-3 VIEWS LEFT  . DG HIP UNILAT W OR W/O PELVIS 2-3 VIEWS RIGHT  . Compliance Drug Analysis, Ur  . Comprehensive metabolic panel  . C-reactive protein  . Magnesium  . Sedimentation rate  . Vitamin B12  . 25-Hydroxyvitamin D Lcms D2+D3   Pharmacotherapy: Medications ordered:  No orders of the defined types were placed in this encounter.  Medications administered during this visit: Ms. Ackroyd had no medications administered during this visit.   Pharmacotherapy under consideration:  Opioid Analgesics: The patient was informed that there is no guarantee that she would be a candidate for opioid analgesics. The decision will be made following CDC guidelines. This decision will be based on the results of diagnostic  studies, as well as Ms. Propp's risk profile.  Membrane stabilizer: To be determined at a later time Muscle relaxant: To be determined at a later time NSAID: To be determined at a later time Other analgesic(s): To be determined at a later time   Interventional therapies under consideration: Ms. Shippy was informed that there is no guarantee that she would be a candidate for interventional therapies. The  decision will be based on the results of diagnostic studies, as well as Ms. Flury's risk profile.  Possible procedure(s): Diagnostic bilateral lumbar facet block  Possible bilateral lumbar facet RFA  Diagnostic right-sided cervical epidural steroid injection Diagnostic bilateral cervical facet block Possible bilateral cervical facet RFA  Diagnostic intra-articular hip joint injection Diagnostic bilateral femoral nerve and obturator nerve  Possible bilateral femoral nerve and obturator nerve RFA  Diagnostic right-sided occipital nerve block  Possible right-sided occipital nerve RFA  Possible right-sided occipital peripheral nerve stimulator trial    Provider-requested follow-up: Return for 2nd Visit after having completed the order test and blood work.  Future Appointments Date Time Provider [place]  05/01/2017 8:00 AM Birdie Sons, MD [facility] None  02/25/2018 8:30 AM [facility] 1 [facility] None  02/25/2018 9:00 AM [facility] 1 [facility] None  02/25/2018 10:00 AM Katha Cabal, MD [facility] None    Primary Care Physician: Birdie Sons, MD Location: [facility] Note by: Kathlen Brunswick. Dossie Arbour, M.D, DABA, DABAPM, DABPM, DABIPP, FIPP Date: 03/08/2017; Time: 2:19 PM  Pain Score Disclaimer: We use the NRS-11 scale. This is a self-reported, subjective measurement of pain severity with only modest accuracy. It is used primarily to identify changes within a particular patient. It must be understood that outpatient pain scales are significantly less  accurate that those used for research, where they can be applied under ideal controlled circumstances with minimal exposure to variables. In reality, the score is likely to be a combination of pain intensity and pain affect, where pain affect describes the degree of emotional arousal or changes in action readiness caused by the sensory experience of pain. Factors such as social and work situation, setting, emotional state, anxiety levels, expectation, and prior pain experience may influence pain perception and show large inter-individual differences that may also be affected by time variables.  Patient instructions provided during this appointment: Patient Instructions   You have been instructed to get xrays and labwork done at the [facility] mall today.  You have been ordered an MRI and the MRI dept will call you to schedule that.   Pain Score  Introduction: The pain score used by this practice is the Verbal Numerical Rating Scale (VNRS-11). This is an 11-point scale. It is for adults and children 10 years or older. There are significant differences in how the pain score is reported, used, and applied. Forget everything you learned in the past and learn this scoring system.  General Information: The scale should reflect your current level of pain. Unless you are specifically asked for the level of your worst pain, or your average pain. If you are asked for one of these two, then it should be understood that it is over the past 24 hours.  Basic Activities of Daily Living (ADL): Personal hygiene, dressing, eating, transferring, and using restroom.  Instructions: Most patients tend to report their level of pain as a combination of two factors, their physical pain and their psychosocial pain. This last one is also known as "suffering" and it is reflection of how physical pain affects you socially and psychologically. From now on, report them separately. From this point on, when asked to report your pain  level, report only your physical pain. Use the following table for reference.  Pain Clinic Pain Levels (0-5/10)  Pain Level Score Description  No Pain 0   Mild pain 1 Nagging, annoying, but does not interfere with basic activities of daily living (ADL). Patients are  able to eat, bathe, get dressed, toileting (being able to get on and off the toilet and perform personal hygiene functions), transfer (move in and out of bed or a chair without assistance), and maintain continence (able to control bladder and bowel functions). Blood pressure and heart rate are unaffected. A normal heart rate for a healthy adult ranges from 60 to 100 bpm (beats per minute).   Mild to moderate pain 2 Noticeable and distracting. Impossible to hide from other people. More frequent flare-ups. Still possible to adapt and function close to normal. It can be very annoying and may have occasional stronger flare-ups. With discipline, patients may get used to it and adapt.   Moderate pain 3 Interferes significantly with activities of daily living (ADL). It becomes difficult to feed, bathe, get dressed, get on and off the toilet or to perform personal hygiene functions. Difficult to get in and out of bed or a chair without assistance. Very distracting. With effort, it can be ignored when deeply involved in activities.   Moderately severe pain 4 Impossible to ignore for more than a few minutes. With effort, patients may still be able to manage work or participate in some social activities. Very difficult to concentrate. Signs of autonomic nervous system discharge are evident: dilated pupils (mydriasis); mild sweating (diaphoresis); sleep interference. Heart rate becomes elevated (>115 bpm). Diastolic blood pressure (lower number) rises above 100 mmHg. Patients find relief in laying down and not moving.   Severe pain 5 Intense and extremely unpleasant. Associated with frowning face and frequent crying. Pain overwhelms the senses.   Ability to do any activity or maintain social relationships becomes significantly limited. Conversation becomes difficult. Pacing back and forth is common, as getting into a comfortable position is nearly impossible. Pain wakes you up from deep sleep. Physical signs will be obvious: pupillary dilation; increased sweating; goosebumps; brisk reflexes; cold, clammy hands and feet; nausea, vomiting or dry heaves; loss of appetite; significant sleep disturbance with inability to fall asleep or to remain asleep. When persistent, significant weight loss is observed due to the complete loss of appetite and sleep deprivation.  Blood pressure and heart rate becomes significantly elevated. Caution: If elevated blood pressure triggers a pounding headache associated with blurred vision, then the patient should immediately seek attention at an urgent or emergency care unit, as these may be signs of an impending stroke.    Emergency Department Pain Levels (6-10/10)  Emergency Room Pain 6 Severely limiting. Requires emergency care and should not be seen or managed at an outpatient pain management facility. Communication becomes difficult and requires great effort. Assistance to reach the emergency department may be required. Facial flushing and profuse sweating along with potentially dangerous increases in heart rate and blood pressure will be evident.   Distressing pain 7 Self-care is very difficult. Assistance is required to transport, or use restroom. Assistance to reach the emergency department will be required. Tasks requiring coordination, such as bathing and getting dressed become very difficult.   Disabling pain 8 Self-care is no longer possible. At this level, pain is disabling. The individual is unable to do even the most "basic" activities such as walking, eating, bathing, dressing, transferring to a bed, or toileting. Fine motor skills are lost. It is difficult to think clearly.   Incapacitating pain 9 Pain  becomes incapacitating. Thought processing is no longer possible. Difficult to remember your own name. Control of movement and coordination are lost.   The worst pain imaginable 10 At this level,  most patients pass out from pain. When this level is reached, collapse of the autonomic nervous system occurs, leading to a sudden drop in blood pressure and heart rate. This in turn results in a temporary and dramatic drop in blood flow to the brain, leading to a loss of consciousness. Fainting is one of the body's self defense mechanisms. Passing out puts the brain in a calmed state and causes it to shut down for a while, in order to begin the healing process.    Summary: 1. Refer to this scale when providing [place] with your pain level. 2. Be accurate and careful when reporting your pain level. This will help with your care. 3. Over-reporting your pain level will lead to loss of credibility. 4. Even a level of 1/10 means that there is pain and will be treated at our facility. 5. High, inaccurate reporting will be documented as "Symptom Exaggeration", leading to loss of credibility and suspicions of possible secondary gains such as obtaining more narcotics, or wanting to appear disabled, for fraudulent reasons. 6. Only pain levels of 5 or below will be seen at our facility. 7. Pain levels of 6 and above will be sent to the Emergency Department and the appointment cancelled. _____________________________________________________________________________________________

## 2017-03-08 NOTE — Progress Notes (Signed)
Safety precautions to be maintained throughout the outpatient stay will include: orient to surroundings, keep bed in low position, maintain call bell within reach at all times, provide assistance with transfer out of bed and ambulation. Patient brought pain medicine bottle dated as filled on 02/11/2015 oxycodone 5 mg tablets with a count of10 tablets out of 120 tablets .Patient last took this pain medicine approx 8 weeks ago.

## 2017-03-08 NOTE — Patient Instructions (Addendum)
You have been instructed to [facility] and [facility].  You have been ordered an MRI and the MRI dept will call you to schedule that.   Pain Score  Introduction: The pain score used by this practice is the Verbal Numerical Rating Scale (VNRS-11). This is an 11-point scale. It is for adults and children 10 years or older. There are significant differences in how the pain score is reported, used, and applied. Forget everything you learned in the past and learn this scoring system.  General Information: The scale should reflect your current level of pain. Unless you are specifically asked for the level of your worst pain, or your average pain. If you are asked for one of these two, then it should be understood that it is over the past 24 hours.  Basic Activities of Daily Living (ADL): Personal hygiene, dressing, eating, transferring, and using restroom.  Instructions: Most patients tend to report their level of pain as a combination of two factors, their physical pain and their psychosocial pain. This last one is also known as "suffering" and it is reflection of how physical pain affects you socially and psychologically. From now on, report them separately. From this point on, when asked to report your pain level, report only your physical pain. Use the following table for reference.  Pain Clinic Pain Levels (0-5/10)  Pain Level Score Description  No Pain 0   Mild pain 1 Nagging, annoying, but does not interfere with basic activities of daily living (ADL). Patients are able to eat, bathe, get dressed, toileting (being able to get on and off the toilet and perform personal hygiene functions), transfer (move in and out of bed or a chair without assistance), and maintain continence (able to control bladder and bowel functions). Blood pressure and heart rate are unaffected. A normal heart rate for a healthy adult ranges from 60 to 100 bpm (beats per minute).   Mild to moderate  pain 2 Noticeable and distracting. Impossible to hide from other people. More frequent flare-ups. Still possible to adapt and function close to normal. It can be very annoying and may have occasional stronger flare-ups. With discipline, patients may get used to it and adapt.   Moderate pain 3 Interferes significantly with activities of daily living (ADL). It becomes difficult to feed, bathe, get dressed, get on and off the toilet or to perform personal hygiene functions. Difficult to get in and out of bed or a chair without assistance. Very distracting. With effort, it can be ignored when deeply involved in activities.   Moderately severe pain 4 Impossible to ignore for more than a few minutes. With effort, patients may still be able to manage work or participate in some social activities. Very difficult to concentrate. Signs of autonomic nervous system discharge are evident: dilated pupils (mydriasis); mild sweating (diaphoresis); sleep interference. Heart rate becomes elevated (>115 bpm). Diastolic blood pressure (lower number) rises above 100 mmHg. Patients find relief in laying down and not moving.   Severe pain 5 Intense and extremely unpleasant. Associated with frowning face and frequent crying. Pain overwhelms the senses.  Ability to do any activity or maintain social relationships becomes significantly limited. Conversation becomes difficult. Pacing back and forth is common, as getting into a comfortable position is nearly impossible. Pain wakes you up from deep sleep. Physical signs will be obvious: pupillary dilation; increased sweating; goosebumps; brisk reflexes; cold, clammy hands and feet; nausea, vomiting or dry heaves; loss of appetite;  significant sleep disturbance with inability to fall asleep or to remain asleep. When persistent, significant weight loss is observed due to the complete loss of appetite and sleep deprivation.  Blood pressure and heart rate becomes significantly  elevated. Caution: If elevated blood pressure triggers a pounding headache associated with blurred vision, then the patient should immediately seek attention at an urgent or emergency care unit, as these may be signs of an impending stroke.    Emergency Department Pain Levels (6-10/10)  Emergency Room Pain 6 Severely limiting. Requires emergency care and should not be seen or managed at an outpatient pain management facility. Communication becomes difficult and requires great effort. Assistance to reach the emergency department may be required. Facial flushing and profuse sweating along with potentially dangerous increases in heart rate and blood pressure will be evident.   Distressing pain 7 Self-care is very difficult. Assistance is required to transport, or use restroom. Assistance to reach the emergency department will be required. Tasks requiring coordination, such as bathing and getting dressed become very difficult.   Disabling pain 8 Self-care is no longer possible. At this level, pain is disabling. The individual is unable to do even the most "basic" activities such as walking, eating, bathing, dressing, transferring to a bed, or toileting. Fine motor skills are lost. It is difficult to think clearly.   Incapacitating pain 9 Pain becomes incapacitating. Thought processing is no longer possible. Difficult to remember your own name. Control of movement and coordination are lost.   The worst pain imaginable 10 At this level, most patients pass out from pain. When this level is reached, collapse of the autonomic nervous system occurs, leading to a sudden drop in blood pressure and heart rate. This in turn results in a temporary and dramatic drop in blood flow to the brain, leading to a loss of consciousness. Fainting is one of the body's self defense mechanisms. Passing out puts the brain in a calmed state and causes it to shut down for a while, in order to begin the healing process.     Summary: 1. Refer to this scale when providing [place] with your pain level. 2. Be accurate and careful when reporting your pain level. This will help with your care. 3. Over-reporting your pain level will lead to loss of credibility. 4. Even a level of 1/10 means that there is pain and will be treated at our facility. 5. High, inaccurate reporting will be documented as "Symptom Exaggeration", leading to loss of credibility and suspicions of possible secondary gains such as obtaining more narcotics, or wanting to appear disabled, for fraudulent reasons. 6. Only pain levels of 5 or below will be seen at our facility. 7. Pain levels of 6 and above will be sent to the Emergency Department and the appointment cancelled. _____________________________________________________________________________________________

## 2017-03-11 LAB — 25-HYDROXY VITAMIN D LCMS D2+D3
25-Hydroxy, Vitamin D-2: 8.2 ng/mL
25-Hydroxy, Vitamin D-3: 16 ng/mL
25-Hydroxy, Vitamin D: 24 ng/mL — ABNORMAL LOW

## 2017-03-14 ENCOUNTER — Encounter: Payer: Self-pay | Admitting: Pain Medicine

## 2017-03-14 ENCOUNTER — Other Ambulatory Visit: Payer: Self-pay | Admitting: Pain Medicine

## 2017-03-14 DIAGNOSIS — E559 Vitamin D deficiency, unspecified: Secondary | ICD-10-CM

## 2017-03-14 MED ORDER — VITAMIN D (ERGOCALCIFEROL) 1.25 MG (50000 UNIT) PO CAPS: ORAL_CAPSULE | ORAL | 0 refills | Status: DC

## 2017-03-14 MED ORDER — OVER THE COUNTER MEDICATION: 2400.0000 mg | Freq: Every day | 0 refills | Status: AC

## 2017-03-14 MED ORDER — VITAMIN D3 50 MCG (2000 UT) PO CAPS: ORAL_CAPSULE | ORAL | 99 refills | Status: DC

## 2017-03-14 NOTE — Progress Notes (Signed)
Test: Blood sugar (Glucose levels) Finding(s): High (hyperglycemia) Normal Level(s): Normal fasting (NPO x 8 hours) glucose levels are between 65-99 mg/dl, with 2 hour fasting, levels are usually less than 140 mg/dl. Clinical significance: Any random blood glucose level greater than 200 mg/dl is considered to be Diabetes. Signs and symptoms may include: (when persistently above 180 mg/dL) Increased thirst; headaches; trouble concentrating; blurred vision; frequent peeing (urination); fatigue (weakness and tired feeling); weight loss. The most common and classical symptoms of an undiagnosed diabetes with hyperglycemia are: Increased urinary frequency (polyuria), thirst (polydipsia), hunger (polyphagia), and unexplained weight loss; numbness in the extremities, pain in feet (dysesthesias), fatigue, and blurred vision; recurrent or severe infections; loss of consciousness or severe nausea/vomiting (ketoacidosis) or coma. Patient Recommendation(s): Fasting levels above 140 mg/dL or any levels above 200 mg/dL should follow-up with PCP (Primary Care Physician) for further evaluation. ___________________________________________________________________________________

## 2017-03-14 NOTE — Progress Notes (Signed)
Test: Vitamin D levels Finding(s): Low  Normal Level(s): between 30 and 100 ng/mL. Vitamin D Insufficiency: Levels between 20-30 ng/ml are defined as a "Vitamin D insufficiency". Vitamin D Deficiency: Levels below 20 ng/ml, is diagnosed as a "Vitamin D Deficiency". Clinical significance:  Low 25-hydroxyvitamin D: A low blood level of 25-hydroxyvitamin D may mean that a person is not getting enough exposure to sunlight or enough dietary vitamin D to meet his or her body's demand or that there is a problem with its absorption from the intestines. Occasionally, drugs used to treat seizures, particularly phenytoin (Dilantin), can interfere with the production of 25-hydroxyvitamin D in the liver. There is some evidence that vitamin D deficiency may increase the risk of some cancers, immune diseases, and cardiovascular disease. Low 1,25-dihydroxyvitamin D: A low level of 1,25-dihydroxyvitamin D can be seen in kidney disease and is one of the earliest changes to occur in persons with early kidney failure. Signs and symptoms may include: Vitamin D deficiencies and insufficiencies may be associated with fatigue, weakness, bone pain, joint pain, and muscle pain. Associated complications may include: hypocalcemia, hypophosphatemia, and reduced bone density. Possible causes:  - Most common: dietary insufficiency; inadequate sun exposure; inability to absorb vitamin D from the intestines; or inability to process it due to kidney or liver disease. Patient Recommendation(s): Patient may benefit from taking over-the-counter Vitamin D3 supplements. I recommend a vitamin D + Calcium supplements. "Natures Bounty", a brand easily found in most pharmacies, has a formulation containing Calcium 1200 mg plus Vitamin D3 1000 IU, in Softgels capsules that are easy to swallow. This should be taken once a day, preferably in the morning as vitamin D will increase energy levels and make it difficult to fall asleep, if taken at night.  Patients with levels lower than 20 ng/ml should contact their primary care physicians to receive replacement therapy. Vitamin D3 can be obtained over-the-counter, without a prescription. Vitamin D2 requires a prescription and it is used for replacement therapy. ___________________________________________________________________________________

## 2017-03-15 LAB — COMPLIANCE DRUG ANALYSIS, UR

## 2017-03-22 ENCOUNTER — Other Ambulatory Visit: Payer: Self-pay | Admitting: Family Medicine

## 2017-03-22 DIAGNOSIS — M9979 Connective tissue and disc stenosis of intervertebral foramina of abdomen and other regions: Secondary | ICD-10-CM

## 2017-03-26 ENCOUNTER — Telehealth: Payer: Self-pay | Admitting: Pain Medicine

## 2017-03-26 NOTE — Telephone Encounter (Signed)
Went to pick up regular meds and found out she had 2 scripts for vitamin D2 and D3, she knew nothing about them, did not receive a call telling her to pick them up or anything. Just was wanting to let us know that she was not notified about medications or test results.

## 2017-03-26 NOTE — Telephone Encounter (Signed)
Left voicemail re; labwork and why these medications were called in for her.  Patient to call if she has any additional questions.

## 2017-03-28 ENCOUNTER — Ambulatory Visit
Admission: RE | Admit: 2017-03-28 | Discharge: 2017-03-28 | Disposition: A | Discharge status: Home / Self Care | Payer: Medicare Other | Source: Ambulatory Visit | Attending: Pain Medicine | Admitting: Pain Medicine

## 2017-03-28 DIAGNOSIS — M5126 Other intervertebral disc displacement, lumbar region: Secondary | ICD-10-CM | POA: Diagnosis not present

## 2017-03-28 DIAGNOSIS — M4312 Spondylolisthesis, cervical region: Secondary | ICD-10-CM | POA: Diagnosis not present

## 2017-03-28 DIAGNOSIS — M48061 Spinal stenosis, lumbar region without neurogenic claudication: Secondary | ICD-10-CM | POA: Insufficient documentation

## 2017-03-28 DIAGNOSIS — M545 Low back pain: Secondary | ICD-10-CM | POA: Diagnosis not present

## 2017-03-28 DIAGNOSIS — M4306 Spondylolysis, lumbar region: Secondary | ICD-10-CM

## 2017-03-28 DIAGNOSIS — M1288 Other specific arthropathies, not elsewhere classified, other specified site: Secondary | ICD-10-CM | POA: Diagnosis not present

## 2017-03-28 DIAGNOSIS — G8929 Other chronic pain: Secondary | ICD-10-CM | POA: Insufficient documentation

## 2017-03-28 DIAGNOSIS — M542 Cervicalgia: Secondary | ICD-10-CM | POA: Diagnosis not present

## 2017-03-28 DIAGNOSIS — M47816 Spondylosis without myelopathy or radiculopathy, lumbar region: Secondary | ICD-10-CM

## 2017-03-28 DIAGNOSIS — M50121 Cervical disc disorder at C4-C5 level with radiculopathy: Secondary | ICD-10-CM | POA: Diagnosis not present

## 2017-03-28 DIAGNOSIS — M4802 Spinal stenosis, cervical region: Secondary | ICD-10-CM

## 2017-03-28 DIAGNOSIS — R51 Headache: Secondary | ICD-10-CM | POA: Diagnosis not present

## 2017-03-28 DIAGNOSIS — G4486 Cervicogenic headache: Secondary | ICD-10-CM

## 2017-03-28 DIAGNOSIS — M4722 Other spondylosis with radiculopathy, cervical region: Secondary | ICD-10-CM

## 2017-04-11 ENCOUNTER — Encounter: Payer: Self-pay | Admitting: Family Medicine

## 2017-04-11 ENCOUNTER — Ambulatory Visit (INDEPENDENT_AMBULATORY_CARE_PROVIDER_SITE_OTHER): Payer: Medicare Other | Admitting: Family Medicine

## 2017-04-11 VITALS — BP 122/78 | HR 85 | Temp 100.3°F | Resp 24 | Wt 279.0 lb

## 2017-04-11 DIAGNOSIS — J40 Bronchitis, not specified as acute or chronic: Secondary | ICD-10-CM

## 2017-04-11 DIAGNOSIS — R059 Cough, unspecified: Secondary | ICD-10-CM

## 2017-04-11 DIAGNOSIS — R05 Cough: Secondary | ICD-10-CM

## 2017-04-11 MED ORDER — LEVOFLOXACIN 750 MG PO TABS: 750.0000 mg | ORAL_TABLET | Freq: Every day | ORAL | 0 refills | Status: AC

## 2017-04-11 MED ORDER — HYDROCODONE-HOMATROPINE 5-1.5 MG/5ML PO SYRP: 5.0000 mL | ORAL_SOLUTION | Freq: Three times a day (TID) | ORAL | 0 refills | Status: DC | PRN

## 2017-04-11 NOTE — Progress Notes (Signed)
Patient: Laura Nielsen Female    DOB: 02/03/1956   61 y.o.   MRN: [id number] Visit Date: 04/11/2017  Today's Provider: Lelon Huh, MD   Chief Complaint  Patient presents with  . Cough   Subjective:    Cough  This is a new problem. The problem has been gradually worsening. The cough is productive of sputum (yellow-green colored ). Associated symptoms include chills, a fever (up to 101.9), myalgias, nasal congestion, postnasal drip, rhinorrhea, a sore throat, shortness of breath, sweats and wheezing. Pertinent negatives include no chest pain, ear pain or headaches. Treatments tried: NyQuil. The treatment provided no relief.       Allergies  Allergen Reactions  . Atorvastatin     Elevated blood sugar  . Omeprazole Nausea And Vomiting     Current Outpatient Prescriptions:  .  allopurinol (ZYLOPRIM) 100 MG tablet, Take 1 tablet (100 mg total) by mouth daily., Disp: 30 tablet, Rfl: 5 .  aspirin 81 MG tablet, Take 81 mg by mouth daily. , Disp: , Rfl:  .  bisoprolol (ZEBETA) 10 MG tablet, Take 1 tablet (10 mg total) by mouth daily., Disp: 30 tablet, Rfl: 6 .  Cholecalciferol (VITAMIN D3) 2000 units capsule, Take 1 capsule (2,000 Units total) by mouth daily., Disp: 30 capsule, Rfl: PRN .  clopidogrel (PLAVIX) 75 MG tablet, Take 1 tablet (75 mg total) by mouth daily., Disp: 90 tablet, Rfl: 3 .  Cyanocobalamin (RA VITAMIN B-12 TR) 1000 MCG TBCR, Take 2 tablets by mouth daily. , Disp: , Rfl:  .  gabapentin (NEURONTIN) 300 MG capsule, Take 1 capsule (300 mg total) by mouth 3 (three) times daily., Disp: 90 capsule, Rfl: 11 .  Ibuprofen 200 MG CAPS, Take by mouth. , Disp: , Rfl:  .  lisinopril (PRINIVIL,ZESTRIL) 20 MG tablet, Take 1 tablet (20 mg total) by mouth daily., Disp: 90 tablet, Rfl: 3 .  loratadine (CLARITIN) 10 MG tablet, Take 10 mg by mouth daily as needed. , Disp: , Rfl:  .  lovastatin (MEVACOR) 40 MG tablet, Take 1 and 1/2 Tablets Daily., Disp: 45 tablet, Rfl: 5 .  OVER  THE COUNTER MEDICATION, Take 2,400 mg by mouth daily with breakfast. Take two (2) Softgels daily., Disp: 1 Bottle, Rfl: 0 .  oxyCODONE-acetaminophen (PERCOCET) 5-325 MG per tablet, Take 1 tablet by mouth every 8 (eight) hours as needed. , Disp: , Rfl:  .  triamcinolone cream (KENALOG) 0.1 %, TRIAMCINOLONE ACETONIDE, 0.1% (External Cream)  1 Cream Cream apply to hands bid for 0 days  Quantity: 60;  Refills: 0   Ordered :12-May-2011  [name] ;  Started 12-May-2011 Active Comments: DX: 692.9, Disp: , Rfl:  .  vitamin C (ASCORBIC ACID) 500 MG tablet, Take 500 mg by mouth. , Disp: , Rfl:  .  Vitamin D, Ergocalciferol, (DRISDOL) 50000 units CAPS capsule, Take 1 capsule (50,000 Units total) by mouth 2 (two) times a week x 6 weeks., Disp: 12 capsule, Rfl: 0 .  albuterol (PROVENTIL HFA) 108 (90 BASE) MCG/ACT inhaler, Inhale into the lungs., Disp: , Rfl:  .  amitriptyline (ELAVIL) 25 MG tablet, Take 1 tablet (25 mg total) by mouth at bedtime., Disp: 30 tablet, Rfl: 6 .  buPROPion (WELLBUTRIN SR) 150 MG 12 hr tablet, 1 tablet daily for 3 days, then 1 tablet twice daily. Stop smoking 14 days after starting medication, Disp: 60 tablet, Rfl: 6  Review of Systems  Constitutional: Positive for chills, diaphoresis, fatigue and fever (up  to 101.9). Negative for appetite change.  HENT: Positive for congestion, postnasal drip, rhinorrhea, sneezing and sore throat. Negative for ear discharge, ear pain, mouth sores and nosebleeds.   Eyes: Positive for discharge.  Respiratory: Positive for cough, shortness of breath and wheezing. Negative for chest tightness.   Cardiovascular: Negative for chest pain and palpitations.  Gastrointestinal: Negative for abdominal pain, nausea and vomiting.  Musculoskeletal: Positive for myalgias.  Neurological: Negative for dizziness, weakness and headaches.    Social History  Substance Use Topics  . Smoking status: Current Every Day Smoker    Packs/day: 1.00    Years: 30.00     Types: Cigarettes  . Smokeless tobacco: Never Used     Comment: previously smoked over 2 ppd  . Alcohol use Yes     Comment: Rarely   Objective:   BP 122/78 (BP Location: Left Arm, Patient Position: Sitting, Cuff Size: Large)   Pulse 85   Temp 100.3 F (37.9 C) (Oral)   Resp (!) 24   Wt 279 lb (126.6 kg)   SpO2 95% Comment: room air  BMI 42.42 kg/m  There were no vitals filed for this visit.   Physical Exam   General Appearance:    Alert, cooperative, no distress  Eyes:    PERRL, conjunctiva/corneas clear, EOM's intact       Lungs:     Occasional expiratory wheeze, no rales, , respirations unlabored  Heart:    Regular rate and rhythm  Neurologic:   Awake, alert, oriented x 3. No apparent focal neurological           defect.           Assessment & Plan:     1. Cough  - HYDROcodone-homatropine (HYCODAN) 5-1.5 MG/5ML syrup; Take 5 mLs by mouth every 8 (eight) hours as needed for cough.  Dispense: 120 mL; Refill: 0  2. Bronchitis  - levofloxacin (LEVAQUIN) 750 MG tablet; Take 1 tablet (750 mg total) by mouth daily.  Dispense: 7 tablet; Refill: 0  Call if symptoms change or if not rapidly improving.          Lelon Huh, MD  [facility]

## 2017-04-12 ENCOUNTER — Other Ambulatory Visit: Payer: Self-pay | Admitting: Family Medicine

## 2017-04-12 MED ORDER — ALBUTEROL SULFATE HFA 108 (90 BASE) MCG/ACT IN AERS: 2.0000 | INHALATION_SPRAY | Freq: Four times a day (QID) | RESPIRATORY_TRACT | 11 refills | Status: DC | PRN

## 2017-04-12 NOTE — Telephone Encounter (Signed)
Pt forgot to get a refill for her inhaler   albuterol (PROVENTIL HFA) 108 (90 BASE) MCG/ACT inhaler  [facility]  Thanks [employer]

## 2017-04-26 ENCOUNTER — Ambulatory Visit: Payer: Medicare Other | Attending: Pain Medicine | Admitting: Pain Medicine

## 2017-04-26 ENCOUNTER — Other Ambulatory Visit
Admission: RE | Admit: 2017-04-26 | Discharge: 2017-04-26 | Disposition: A | Discharge status: Home / Self Care | Payer: Medicare Other | Source: Ambulatory Visit | Attending: Pain Medicine | Admitting: Pain Medicine

## 2017-04-26 ENCOUNTER — Encounter: Payer: Self-pay | Admitting: Pain Medicine

## 2017-04-26 ENCOUNTER — Other Ambulatory Visit: Payer: Self-pay | Admitting: Pain Medicine

## 2017-04-26 VITALS — BP 159/83 | HR 75 | Temp 97.6°F | Resp 16 | Ht 68.0 in | Wt 268.0 lb

## 2017-04-26 DIAGNOSIS — M47816 Spondylosis without myelopathy or radiculopathy, lumbar region: Secondary | ICD-10-CM

## 2017-04-26 DIAGNOSIS — I6523 Occlusion and stenosis of bilateral carotid arteries: Secondary | ICD-10-CM | POA: Diagnosis not present

## 2017-04-26 DIAGNOSIS — M9979 Connective tissue and disc stenosis of intervertebral foramina of abdomen and other regions: Secondary | ICD-10-CM

## 2017-04-26 DIAGNOSIS — G629 Polyneuropathy, unspecified: Secondary | ICD-10-CM

## 2017-04-26 DIAGNOSIS — M546 Pain in thoracic spine: Secondary | ICD-10-CM

## 2017-04-26 DIAGNOSIS — G894 Chronic pain syndrome: Secondary | ICD-10-CM

## 2017-04-26 DIAGNOSIS — M542 Cervicalgia: Secondary | ICD-10-CM

## 2017-04-26 DIAGNOSIS — Z7901 Long term (current) use of anticoagulants: Secondary | ICD-10-CM | POA: Insufficient documentation

## 2017-04-26 DIAGNOSIS — Z9851 Tubal ligation status: Secondary | ICD-10-CM | POA: Diagnosis not present

## 2017-04-26 DIAGNOSIS — M545 Low back pain, unspecified: Secondary | ICD-10-CM

## 2017-04-26 DIAGNOSIS — Z90710 Acquired absence of both cervix and uterus: Secondary | ICD-10-CM | POA: Diagnosis not present

## 2017-04-26 DIAGNOSIS — M1288 Other specific arthropathies, not elsewhere classified, other specified site: Secondary | ICD-10-CM | POA: Diagnosis not present

## 2017-04-26 DIAGNOSIS — Z9889 Other specified postprocedural states: Secondary | ICD-10-CM | POA: Diagnosis not present

## 2017-04-26 DIAGNOSIS — Z79899 Other long term (current) drug therapy: Secondary | ICD-10-CM | POA: Insufficient documentation

## 2017-04-26 DIAGNOSIS — F1190 Opioid use, unspecified, uncomplicated: Secondary | ICD-10-CM | POA: Diagnosis not present

## 2017-04-26 DIAGNOSIS — M62830 Muscle spasm of back: Secondary | ICD-10-CM

## 2017-04-26 DIAGNOSIS — Z79891 Long term (current) use of opiate analgesic: Secondary | ICD-10-CM | POA: Diagnosis not present

## 2017-04-26 DIAGNOSIS — M7918 Myalgia, other site: Secondary | ICD-10-CM | POA: Insufficient documentation

## 2017-04-26 DIAGNOSIS — G8929 Other chronic pain: Secondary | ICD-10-CM | POA: Diagnosis not present

## 2017-04-26 DIAGNOSIS — M4696 Unspecified inflammatory spondylopathy, lumbar region: Secondary | ICD-10-CM | POA: Diagnosis not present

## 2017-04-26 DIAGNOSIS — M47896 Other spondylosis, lumbar region: Secondary | ICD-10-CM | POA: Diagnosis not present

## 2017-04-26 DIAGNOSIS — M792 Neuralgia and neuritis, unspecified: Secondary | ICD-10-CM

## 2017-04-26 DIAGNOSIS — M791 Myalgia: Secondary | ICD-10-CM

## 2017-04-26 LAB — PLATELET FUNCTION ASSAY: Collagen / Epinephrine: 136 seconds (ref 0–193)

## 2017-04-26 MED ORDER — GABAPENTIN 300 MG PO CAPS: 300.0000 mg | ORAL_CAPSULE | Freq: Three times a day (TID) | ORAL | 0 refills | Status: DC

## 2017-04-26 MED ORDER — OXYCODONE HCL 5 MG PO TABS: 5.0000 mg | ORAL_TABLET | Freq: Four times a day (QID) | ORAL | 0 refills | Status: DC | PRN

## 2017-04-26 MED ORDER — AMITRIPTYLINE HCL 25 MG PO TABS: 25.0000 mg | ORAL_TABLET | Freq: Every day | ORAL | 0 refills | Status: DC

## 2017-04-26 NOTE — Progress Notes (Signed)
Patient's Name: Laura Nielsen  MRN: [id number]  Referring Provider: Birdie Sons, MD  DOB: 08-05-1956  PCP: Birdie Sons, MD  DOS: 04/26/2017  Note by: Kathlen Brunswick. Dossie Arbour, MD  Service setting: Ambulatory outpatient  Specialty: Interventional Pain Management  Location: [facility] (AMB) Pain Management Facility    Patient type: Established   Primary Reason(s) for Visit: Encounter for evaluation before starting new chronic pain management plan of care (Level of risk: moderate) CC: Neck Pain (back of neck); Back Pain (lower); and Hip Pain (both)  HPI  Laura Nielsen is a 61 y.o. year old, female patient, who comes today for a follow-up evaluation to review the test results and decide on a treatment plan. She has Allergic rhinitis; Aortic heart valve narrowing; Arthritis; Airway hyperreactivity; CAD in native artery; Carotid artery narrowing; Claudication ([place]); CAFL (chronic airflow limitation) ([place]); Smoking greater than 30 pack years; Narrowing of intervertebral disc space; Clinical depression; Diabetes mellitus, type 2 ([place]); Acid reflux; Hypercholesteremia; Benign essential HTN; Urinary incontinence; Malaise and fatigue; Pins and needles sensation; Obstructive apnea; Adiposity; Arthritis, degenerative; Abnormal Pap smear of vagina; Peripheral blood vessel disorder ([place]); B12 deficiency; Vitamin D insufficiency; AAA (abdominal aortic aneurysm) without rupture ([place]); Ankle pain; Gout; Atherosclerosis of native arteries of extremity with intermittent claudication ([place]); Chronic pain syndrome; Long term current use of opiate analgesic; Long term prescription opiate use; Opiate use; Chronic low back pain (Location of Primary Source of Pain) (Bilateral) (L>R); Chronic neck pain (Location of Secondary source of pain) (Bilateral) (R>L); Cervicogenic headache (Right); Chronic upper back pain (Location of Tertiary source of pain) (Bilateral) (R>L); Chronic hip pain (Bilateral) (L>R); Osteoarthritis of hip (Bilateral)  (L>R); Cervical central spinal stenosis; Cervical spondylosis with radiculopathy (Right) (C5); Lumbar spondylosis; Lumbar spondylolysis; Lumbar facet syndrome (Bilateral) (L>R); Lumbar facet hypertrophy (multilevel) (Bilateral); Long term current use of anticoagulant therapy; Neurogenic pain; and Musculoskeletal pain on her problem list. Her primarily concern today is the Neck Pain (back of neck); Back Pain (lower); and Hip Pain (both)  Pain Assessment: Self-Reported Pain Score: 6 /10 Clinically the patient looks like a 2/10 Reported level is inconsistent with clinical observations. Information on the proper use of the pain scale provided to the patient today Pain Type: Chronic pain Pain Location: Neck (back pain 7/10) Pain Orientation: Posterior Pain Descriptors / Indicators: Aching, Constant, Sharp, Radiating Pain Frequency: Constant  Laura Nielsen comes in today for a follow-up visit after her initial evaluation on 03/26/2017. Today we went over the results of her tests. These were explained in "Layman's terms". During today's appointment we went over my diagnostic impression, as well as the proposed treatment plan.  The patient was provided today with counseling on smoking cessation as well as instructions to bring down her BMI to less than 30.  In considering the treatment plan options, [name] was reminded that I no longer take patients for medication management only. I asked her to let me know if she had no intention of taking advantage of the interventional therapies, so that we could make arrangements to provide this space to someone interested. I also made it clear that undergoing interventional therapies for the purpose of getting pain medications is very inappropriate on the part of a patient, and it will not be tolerated in this practice. This type of behavior would suggest true addiction and therefore it requires referral to an addiction specialist.   Further details on both, my  assessment(s), as well as the proposed treatment plan, please see below. Controlled Substance  Pharmacotherapy Assessment REMS (Risk Evaluation and Mitigation Strategy)  Analgesic: Oxycodone IR 5 mg 1 to twice a week. Highest recorded MME/day: 42.86 mg/day MME/day: 15 mg/day Pill Count: None expected due to no prior prescriptions written by our practice. Pharmacokinetics: Liberation and absorption (onset of action): WNL Distribution (time to peak effect): WNL Metabolism and excretion (duration of action): WNL         Pharmacodynamics: Desired effects: Analgesia: [name] reports >50% benefit. Functional ability: Patient reports that medication allows her to accomplish basic ADLs Clinically meaningful improvement in function (CMIF): Sustained CMIF goals met Perceived effectiveness: Described as relatively effective, allowing for increase in activities of daily living (ADL) Undesirable effects: Side-effects or Adverse reactions: None reported Monitoring: [place] PMP: Online review of the past 77-monthperiod previously conducted. Not applicable at this point since we have not taken over the patient's medication management yet. List of all Serum Drug Screening Test(s):  No results found for: AMPHSCRSER, BARBSCRSER, BENZOSCRSER, COCAINSCRSER, PCPSCRSER, THCSCRSER, OPIATESCRSER, [place] of all UDS test(s) done:  Lab Results  Component Value Date   SUMMARY FINAL 03/08/2017   Last UDS on record: Summary  Date Value Ref Range Status  03/08/2017 FINAL  Final    Comment:    ==================================================================== TOXASSURE COMP DRUG ANALYSIS,UR ==================================================================== Test                             Result       Flag       Units Drug Present and Declared for Prescription Verification   Gabapentin                     PRESENT      EXPECTED   Amitriptyline                  PRESENT      EXPECTED    Nortriptyline                  PRESENT      EXPECTED    Nortriptyline is an expected metabolite of amitriptyline.   Salicylate                     PRESENT      EXPECTED Drug Absent but Declared for Prescription Verification   Oxycodone                      Not Detected UNEXPECTED ng/mg creat   Bupropion                      Not Detected UNEXPECTED   Acetaminophen                  Not Detected UNEXPECTED    Acetaminophen, as indicated in the declared medication list, is    not always detected even when used as directed.   Ibuprofen                      Not Detected UNEXPECTED    Ibuprofen, as indicated in the declared medication list, is not    always detected even when used as directed. ==================================================================== Test                      Result    Flag   Units      Ref Range   Creatinine  160              mg/dL      >=20 ==================================================================== Declared Medications:  The flagging and interpretation on this report are based on the  following declared medications.  Unexpected results may arise from  inaccuracies in the declared medications.  **Note: The testing scope of this panel includes these medications:  Amitriptyline (Elavil)  Bupropion (Wellbutrin)  Gabapentin  Oxycodone (Oxycodone Acetaminophen)  **Note: The testing scope of this panel does not include small to  moderate amounts of these reported medications:  Acetaminophen (Oxycodone Acetaminophen)  Aspirin  Ibuprofen  **Note: The testing scope of this panel does not include following  reported medications:  Albuterol  Allopurinol (Zyloprim)  Bisoprolol (Zebeta)  Clopidogrel (Plavix)  Cyanocobalamin  Lisinopril  Loratadine (Claritin)  Lovastatin (Mevacor)  Triamcinolone (Kenalog)  Vitamin C  Vitamin D ==================================================================== For clinical consultation, please call [phone]. ====================================================================    UDS interpretation: Unexpected findings not considered significantly abnormal          Medication Assessment Form: Patient introduced to form today Treatment compliance: Treatment may start today if patient agrees with proposed plan. Evaluation of compliance is not applicable at this point Risk Assessment Profile: Aberrant behavior: See initial evaluations. None observed or detected today Comorbid factors increasing risk of overdose: See initial evaluation. No additional risks detected today Risk Mitigation Strategies:  Patient opioid safety counseling: Completed today. Counseling provided to patient as per "Patient Counseling Document". Document signed by patient, attesting to counseling and understanding Patient-Prescriber Agreement (PPA): Obtained today  Controlled substance notification to other providers: Written and sent today  Pharmacologic Plan: Today we may be taking over the patient's pharmacological regimen. See below  Laboratory Chemistry  Inflammation Markers Lab Results  Component Value Date   CRP <0.8 03/08/2017   ESRSEDRATE 5 03/08/2017   (CRP: Acute Phase) (ESR: Chronic Phase) Renal Function Markers Lab Results  Component Value Date   BUN 11 03/08/2017   CREATININE 0.62 03/08/2017   GFRAA >60 03/08/2017   GFRNONAA >60 03/08/2017   Hepatic Function Markers Lab Results  Component Value Date   AST 28 03/08/2017   ALT 29 03/08/2017   ALBUMIN 3.9 03/08/2017   ALKPHOS 99 03/08/2017   Electrolytes Lab Results  Component Value Date   NA 138 03/08/2017   K 3.9 03/08/2017   CL 105 03/08/2017   CALCIUM 9.3 03/08/2017   MG 1.8 03/08/2017   Neuropathy Markers Lab Results  Component Value Date   VITAMINB12 473 03/08/2017   Bone Pathology Markers Lab Results  Component Value Date   ALKPHOS 99 03/08/2017   VD25OH 21.9 (L) 01/09/2017   25OHVITD1 24 (L) 03/08/2017   25OHVITD2  8.2 03/08/2017   25OHVITD3 16 03/08/2017   CALCIUM 9.3 03/08/2017   Coagulation Parameters Lab Results  Component Value Date   INR 1.1 03/28/2013   LABPROT 14.3 03/28/2013   APTT 28.2 03/28/2013   PLT 262 05/18/2016   Cardiovascular Markers Lab Results  Component Value Date   HGB 16.5 (A) 03/31/2015   HCT 49.4 (H) 05/18/2016   Note: Lab results reviewed and explained to patient in Layman's terms.  Recent Diagnostic Imaging Review  Mr Cervical Spine Wo Contrast Result Date: 03/28/2017 CLINICAL DATA:  Neck pain.  Bilateral arm pain for several years. EXAM: MRI CERVICAL SPINE WITHOUT CONTRAST TECHNIQUE: Multiplanar, multisequence MR imaging of the cervical spine was performed. No intravenous contrast was administered. COMPARISON:  07/25/2013 FINDINGS: Alignment: 3-4 mm with chronic anterolisthesis at  C3-4 and C7-T1, facet mediated. Vertebrae: No fracture, evidence of discitis, or bone lesion. Cord: Degenerative distortion. No cord signal abnormality or intrinsic abnormal morphology. Posterior Fossa, vertebral arteries, paraspinal tissues: Negative Disc levels: C2-3: Fat arthropathy with bulky spurring greater on the right. Negative disc. Patent canal and foramina C3-4: Facet arthropathy with bulky spurring causing anterolisthesis. Patent canal and bilateral foramina. C4-5: Advanced disc degeneration with posterior disc osteophyte complex, left eccentric. Bilateral uncovertebral spurring. Facet spurring is mild. Bilateral foraminal impingement. Canal stenosis leading to mild chronic left cord flattening. C5-6: Advanced disc degeneration with ridging and central disc protrusion. Uncovertebral spurring. Negative facets. Spinal stenosis with mild cord flattening. Bilateral foraminal impingement. C6-7: Advanced disc degeneration with posterior disc osteophyte complex contacting the ventral cord. Bilateral foraminal impingement, moderate range in greater on the left. C7-T1:Advanced facet arthropathy  with anterolisthesis. No impingement T2-3: Advanced facet arthropathy with mild moderate right foraminal narrowing. IMPRESSION: 1. Disc degeneration that is advanced from C4-5 to C6-7. Posterior disc osteophyte complexes cause spinal stenosis with mild cord flattening at C4-5 and C5-6. Bilateral foraminal impingement at these levels from uncovertebral ridging. 2. Multilevel advanced facet arthropathy with chronic anterolisthesis at C3-4 and C7-T1. Electronically Signed   By: Monte Fantasia M.D.   On: 03/28/2017 09:33   Mr Lumbar Spine Wo Contrast Result Date: 03/28/2017 CLINICAL DATA:  Low back pain without sciatica.  No known injury. EXAM: MRI LUMBAR SPINE WITHOUT CONTRAST TECHNIQUE: Multiplanar, multisequence MR imaging of the lumbar spine was performed. No intravenous contrast was administered. COMPARISON:  None. FINDINGS: Segmentation:  Standard. Alignment:  Physiologic. Vertebrae:  No fracture, evidence of discitis, or bone lesion. Conus medullaris: Extends to the L1 level and appears normal. Paraspinal and other soft tissues:  No paraspinal abnormality. Disc levels: Disc spaces: Degenerative disc disease with disc height loss at T12-L1. Mild degenerative disc disease with disc height loss at L1-2 and L4-5. T11-12: Mild broad based disc bulge. No evidence of neural foraminal stenosis. No central canal stenosis. T12-L1: Mild broad based disc bulge with a central disc protrusion. Right lateral recess stenosis. Mild bilateral facet arthropathy. Mild spinal stenosis. L1-L2: Broad-based disc bulge with a tiny right paracentral disc protrusion. Mild spinal stenosis. Mild bilateral facet arthropathy. No evidence of neural foraminal stenosis. L2-L3: Broad-based disc bulge flattening the ventral thecal sac. Moderate bilateral facet arthropathy, right worse than left. Bilateral lateral recess stenosis. Moderate spinal stenosis. No evidence of neural foraminal stenosis. L3-L4: Broad-based disc bulge with a right  lateral disc protrusion. Moderate right and mild left facet arthropathy with bilateral lateral recess narrowing. Mild right foraminal stenosis. No left foraminal stenosis. No central canal stenosis. L4-L5: Broad-based disc bulge. Severe left facet arthropathy with severe left ligamentum flavum infolding resulting in severe left lateral recess stenosis. Mild right facet arthropathy. Moderate left foraminal stenosis. Mild spinal stenosis. L5-S1: No significant disc bulge. No evidence of neural foraminal stenosis. No central canal stenosis. Mild bilateral facet arthropathy. IMPRESSION: 1. At L4-5 there is a broad-based disc bulge. Severe left facet arthropathy with severe left ligamentum flavum infolding resulting in severe left lateral recess stenosis. Mild right facet arthropathy. Moderate left foraminal stenosis. Mild spinal stenosis. 2. At L3-4 there is a broad-based disc bulge with a right lateral disc protrusion. Moderate right and mild left facet arthropathy with bilateral lateral recess narrowing. Mild right foraminal stenosis. No left foraminal stenosis. 3. At L2-3 there is a broad-based disc bulge flattening the ventral thecal sac. Moderate bilateral facet arthropathy, right worse than left. Bilateral  lateral recess stenosis. Moderate spinal stenosis. 4. At L1-2 there is a broad-based disc bulge with a tiny right paracentral disc protrusion. Mild spinal stenosis. Mild bilateral facet arthropathy. 5. At T12-L1 there is a broad based disc bulge with a central disc protrusion. Right lateral recess stenosis. Mild bilateral facet arthropathy. Mild spinal stenosis. Electronically Signed   By: Kathreen Devoid   On: 03/28/2017 09:34   Cervical Imaging: Cervical MR wo contrast:  Results for orders placed during the hospital encounter of 03/28/17  MR CERVICAL SPINE WO CONTRAST   Narrative CLINICAL DATA:  Neck pain.  Bilateral arm pain for several years.  EXAM: MRI CERVICAL SPINE WITHOUT  CONTRAST  TECHNIQUE: Multiplanar, multisequence MR imaging of the cervical spine was performed. No intravenous contrast was administered.  COMPARISON:  07/25/2013  FINDINGS: Alignment: 3-4 mm with chronic anterolisthesis at C3-4 and C7-T1, facet mediated.  Vertebrae: No fracture, evidence of discitis, or bone lesion.  Cord: Degenerative distortion. No cord signal abnormality or intrinsic abnormal morphology.  Posterior Fossa, vertebral arteries, paraspinal tissues: Negative  Disc levels:  C2-3: Fat arthropathy with bulky spurring greater on the right. Negative disc. Patent canal and foramina  C3-4: Facet arthropathy with bulky spurring causing anterolisthesis. Patent canal and bilateral foramina.  C4-5: Advanced disc degeneration with posterior disc osteophyte complex, left eccentric. Bilateral uncovertebral spurring. Facet spurring is mild. Bilateral foraminal impingement. Canal stenosis leading to mild chronic left cord flattening.  C5-6: Advanced disc degeneration with ridging and central disc protrusion. Uncovertebral spurring. Negative facets. Spinal stenosis with mild cord flattening. Bilateral foraminal impingement.  C6-7: Advanced disc degeneration with posterior disc osteophyte complex contacting the ventral cord. Bilateral foraminal impingement, moderate range in greater on the left.  C7-T1:Advanced facet arthropathy with anterolisthesis. No impingement  T2-3: Advanced facet arthropathy with mild moderate right foraminal narrowing.  IMPRESSION: 1. Disc degeneration that is advanced from C4-5 to C6-7. Posterior disc osteophyte complexes cause spinal stenosis with mild cord flattening at C4-5 and C5-6. Bilateral foraminal impingement at these levels from uncovertebral ridging. 2. Multilevel advanced facet arthropathy with chronic anterolisthesis at C3-4 and C7-T1.   Electronically Signed   By: Monte Fantasia M.D.   On: 03/28/2017 09:33    Cervical  MR wo contrast:  Results for orders placed in visit on 07/25/13  MR C Spine Ltd W/O Cm   Narrative * PRIOR REPORT IMPORTED FROM AN EXTERNAL SYSTEM *   PRIOR REPORT IMPORTED FROM THE [facility] WORKFLOW SYSTEM   REASON FOR EXAM:    cervical spondylosis  with myelopathy  COMMENTS:   PROCEDURE:     MMR - MMR CERVICAL SPINE WO CONT  - Jul 25 2013  2:01PM   RESULT:     Cervical spine MRI is performed. Some of the images were  repeated because of patient motion artifact. Study shows severe  degenerative  changes essentially from C4 through T1 with multilevel degenerative disc  narrowing, diffuse disc protrusion and spurring causing severe spinal  canal  narrowing and at the C4 and C4-C5 level with some mild increased T2 signal  in the cord at this level. There is severe bilateral foraminal narrowing  at  this level. Similar changes are seen at the C5-C5-C6 level with moderately  severe spinal canal narrowing and bilateral foraminal narrowing at C6-C7.  There does not appear to be subluxation. There is a heterogeneous marrow  signal pattern which could be from degenerative change. There does not  appear to be severe edema to suggest an acute  fracture or compression  deformity. Degenerative disc protrusion and narrowing is seen in the upper  thoracic spine as well. This is seen at T3-T4 on the sagittal images  causing  anterior thecal sac deformity and spinal cord deformity possibly with some  increased signal. A definite syrinx is not evident.   IMPRESSION:      Multilevel severe spinal canal and foraminal stenosis  secondary to degenerative disc protrusion and bony hypertrophic changes.  Surgical consultation is recommended. Changes or not completely evaluated  in  the upper thoracic region on the axial images.   Dictation Site: 1       Cervical DG 2-3 views:  Results for orders placed in visit on 04/20/11  DG Cervical Spine 2 or 3 views   Narrative * PRIOR REPORT IMPORTED FROM AN  EXTERNAL SYSTEM *   PRIOR REPORT IMPORTED FROM THE [facility] WORKFLOW SYSTEM   REASON FOR EXAM:    numbness of left arm  COMMENTS:   PROCEDURE:     KDR - KDXR C-SPINE AP AND LATERAL  - Apr 20 2011 12:27PM   RESULT:     Images of the cervical spine show degenerative disc space  narrowing at C4-C5, C5-C6, C6-C7 and C7-T1 with pronounced hypertrophic  degenerative endplate spurring especially anteriorly at C4-C5, C5-C6 and  to  a lesser extent at C6-C7. Facet hypertrophy, bilateral atherosclerotic  calcification in the carotid bifurcation regions, and a normal  atlantoaxial  alignment are noted. The odontoid appears intact.   IMPRESSION:  1. Advanced cervical spine degenerative change. The foramina were not  evaluated. Given the extent of facet hypertrophy and hypertrophic  degenerative endplate spurring some uncinate spurring causing foraminal  narrowing may certainly be present. MRI may be beneficial for further  assessment.  2. Carotid artery atherosclerotic disease. Correlate for bruits and  consider  screening carotid Doppler interrogation.   Thank you for the opportunity to contribute to the care of your patient.       Lumbosacral Imaging: Lumbar MR wo contrast:  Results for orders placed during the hospital encounter of 03/28/17  MR LUMBAR SPINE WO CONTRAST   Narrative CLINICAL DATA:  Low back pain without sciatica.  No known injury.  EXAM: MRI LUMBAR SPINE WITHOUT CONTRAST  TECHNIQUE: Multiplanar, multisequence MR imaging of the lumbar spine was performed. No intravenous contrast was administered.  COMPARISON:  None.  FINDINGS: Segmentation:  Standard.  Alignment:  Physiologic.  Vertebrae:  No fracture, evidence of discitis, or bone lesion.  Conus medullaris: Extends to the L1 level and appears normal.  Paraspinal and other soft tissues:  No paraspinal abnormality.  Disc levels:  Disc spaces: Degenerative disc disease with disc height loss at T12-L1. Mild  degenerative disc disease with disc height loss at L1-2 and L4-5.  T11-12: Mild broad based disc bulge. No evidence of neural foraminal stenosis. No central canal stenosis.  T12-L1: Mild broad based disc bulge with a central disc protrusion. Right lateral recess stenosis. Mild bilateral facet arthropathy. Mild spinal stenosis.  L1-L2: Broad-based disc bulge with a tiny right paracentral disc protrusion. Mild spinal stenosis. Mild bilateral facet arthropathy. No evidence of neural foraminal stenosis.  L2-L3: Broad-based disc bulge flattening the ventral thecal sac. Moderate bilateral facet arthropathy, right worse than left. Bilateral lateral recess stenosis. Moderate spinal stenosis. No evidence of neural foraminal stenosis.  L3-L4: Broad-based disc bulge with a right lateral disc protrusion. Moderate right and mild left facet arthropathy with bilateral lateral recess narrowing. Mild right foraminal stenosis. No  left foraminal stenosis. No central canal stenosis.  L4-L5: Broad-based disc bulge. Severe left facet arthropathy with severe left ligamentum flavum infolding resulting in severe left lateral recess stenosis. Mild right facet arthropathy. Moderate left foraminal stenosis. Mild spinal stenosis.  L5-S1: No significant disc bulge. No evidence of neural foraminal stenosis. No central canal stenosis. Mild bilateral facet arthropathy.  IMPRESSION: 1. At L4-5 there is a broad-based disc bulge. Severe left facet arthropathy with severe left ligamentum flavum infolding resulting in severe left lateral recess stenosis. Mild right facet arthropathy. Moderate left foraminal stenosis. Mild spinal stenosis. 2. At L3-4 there is a broad-based disc bulge with a right lateral disc protrusion. Moderate right and mild left facet arthropathy with bilateral lateral recess narrowing. Mild right foraminal stenosis. No left foraminal stenosis. 3. At L2-3 there is a broad-based disc bulge  flattening the ventral thecal sac. Moderate bilateral facet arthropathy, right worse than left. Bilateral lateral recess stenosis. Moderate spinal stenosis. 4. At L1-2 there is a broad-based disc bulge with a tiny right paracentral disc protrusion. Mild spinal stenosis. Mild bilateral facet arthropathy. 5. At T12-L1 there is a broad based disc bulge with a central disc protrusion. Right lateral recess stenosis. Mild bilateral facet arthropathy. Mild spinal stenosis.   Electronically Signed   By: Kathreen Devoid   On: 03/28/2017 09:34    Lumbar CT wo contrast:  Results for orders placed in visit on 04/17/13  CT Lumbar Spine Wo Contrast   Narrative * PRIOR REPORT IMPORTED FROM AN EXTERNAL SYSTEM *   PRIOR REPORT IMPORTED FROM THE [facility] WORKFLOW SYSTEM   REASON FOR EXAM:    Neck pain and cervical radiculitis and Low back pain  and  Lumbar Radiculitis  COMMENTS:   PROCEDURE:     KCT - KCT LUMBAR SPINE WO CONTRAST  - Apr 17 2013  3:56PM   RESULT:   Technique: Multiplanar imaging of the lumbar spine was obtained utilizing  helical 3 mm acquisition and bone as well as soft tissue reconstruction  algorithm.   Findings: Mild dextroscoliosis is identified within the lumbar spine.  Severe  Modic endplate changes are identified at the T12-L1 level. Multilevel  vacuum  discs are identified throughout the lumbar spine. There is multilevel disc  space narrowing.   At the T12-L1 level endplate hypertrophic spurring is appreciated which  causes partial effacement of the anterior CSF space. This results in mild  thecal sac narrowing. There is evidence of mild neural foraminal narrowing  on the right and possible exiting nerve root compromise cannot be  excluded.   At the L1-L2 level endplate hypertrophic spurring as well as facet  hypertrophy and ligamentum flavum hypertrophy contribute to mild to  moderate  thecal sac narrowing. There does not appear to be evidence of neural   foraminal narrowing.   At the L2-L3 disc space level facet hypertrophy, endplate hypertrophic  spurring as well as a broad-based disc bulge contributes to moderate  thecal  sac stenosis. There is bilateral mild neural foraminal narrowing and  exiting  nerve root compromise cannot be excluded.   At the L3-L4 level there are multifactorial mild to moderate thecal  stenosis  appreciated secondary to facet hypertrophy, mild endplate hypertrophic  spurring and ligamentum flavum hypertrophy. There does not appear to be  significant thecal sac narrowing.   At the L4-L5 disc space level mild to moderate multifactorial canal  stenosis  is appreciated secondary to facet and ligamentum flavum hypertrophy as  well  as endplate hypertrophic  spurring. There is a component of a disc bulge  which demonstrates lateralization to the right and left and contributes to  bilateral mild neural foraminal narrowing on the right and mild to  moderate  on the left. Exiting nerve root compromise with a component of compression  on the left is of diagnostic consideration.   At the L5-S1 level there is no evidence of canal stenosis or neural  foraminal narrowing. Mild degenerative disc disease changes are  appreciated.   IMPRESSION:   1. Multilevel spondylolysis as well as multilevel degenerative disc  disease  changes. There are areas of canal stenosis as well as neural foraminal  narrowing as described above. Areas of exiting nerve root compromise as  well  as an area of possible nerve root compression at the L4-L5 level on the  left  is a diagnostic consideration. If clinically warranted, surgical  consultation recommended.   Thank you for the opportunity to contribute to the care of your patient.       Lumbar DG 2-3 views:  Results for orders placed in visit on 03/14/12  DG Lumbar Spine 2-3 Views   Narrative * PRIOR REPORT IMPORTED FROM AN EXTERNAL SYSTEM *   PRIOR REPORT IMPORTED FROM THE  [facility] EXAM:    pain  COMMENTS:   PROCEDURE:     KDR - KDXR LUMBAR SPINE AP AND LATERAL  - Mar 14 2012  12:40PM   RESULT:     No acute vertebral body fracture is seen. There is noted  central  depression of the superior vertebral plate of L1 consistent with chronic  change and with there being associated anterior and posterior spur  formation. The vertebral body heights otherwise are well maintained. The  vertebral body alignment is normal. There is narrowing of the L4-L5 and  L5-S1 intervertebral disc spaces consistent with disc disease. This could  be  further evaluated by MR if clinically indicated.   Also noted is an assimilation joint on the right at L5. This can be  associated with pain. The pedicles are bilaterally intact. There is  atherosclerotic calcification in the abdominal aorta.   IMPRESSION:  1. No acute fracture is seen.  2. There is an old appearing central compression deformity of L1.  3. There is narrowing of the L4-L5 and L5-S1 intervertebral disc spaces  consistent with disc disease. This could be further evaluated by MR if  clinically indicated.  4. There is an assimilation joint on the right involving the transverse  process of L5 and the sacrum. Such joints can be associated with pain.   Thank you for the opportunity to contribute to the care of your patient.       Hip Imaging: Hip-R DG 2-3 views:  Results for orders placed during the hospital encounter of 03/08/17  DG HIP UNILAT W OR W/O PELVIS 2-3 VIEWS RIGHT   Narrative CLINICAL DATA:  Hip pain, chronic, unspecified laterality M25.559, G89.29 (ICD-10-CM) Primary osteoarthritis of hips, bilateral M16.0 (ICD-10-CM)  EXAM: DG HIP (WITH OR WITHOUT PELVIS) 2-3V RIGHT  COMPARISON:  None.  FINDINGS: No fracture.  No bone lesion.  There is mild superolateral hip joint space narrowing. No other arthropathic change.  Soft tissues are unremarkable.  IMPRESSION: 1. No  fracture, bone lesion or acute finding. 2. Mild degenerative changes of the right hip.   Electronically Signed   By: Lajean Manes M.D.   On: 03/08/2017 15:05    Hip-L DG 2-3  views:  Results for orders placed during the hospital encounter of 03/08/17  DG HIP UNILAT W OR W/O PELVIS 2-3 VIEWS LEFT   Narrative CLINICAL DATA:  Chronic bilateral hip pain. begain 7 years ago. No previous surgery. Had degenerative bone disease. Pelvis image is attached to one hip series, not both.  EXAM: DG HIP (WITH OR WITHOUT PELVIS) 2-3V LEFT  COMPARISON:  None.  FINDINGS: No fracture.  No bone lesion.  There is a transitional lumbosacral vertebrae. The right transverse process of L5 partly articulates with the upper right sacrum.  The SI joints, symphysis pubis and hip joints are normally aligned. There is mild superolateral hip joint space narrowing bilaterally consistent with mild osteoarthritis.  Bilateral iliac artery stents are noted. Soft tissues are otherwise unremarkable.  IMPRESSION: 1. No fracture or bone lesion. 2. Mild degenerative changes of the left hip with superolateral hip joint space narrowing.   Electronically Signed   By: Lajean Manes M.D.   On: 03/08/2017 15:04    Knee Imaging: Knee-R DG 1-2 views:  Results for orders placed in visit on 05/07/09  DG Knee 1-2 Views Right   Narrative * PRIOR REPORT IMPORTED FROM AN EXTERNAL SYSTEM *   PRIOR REPORT IMPORTED FROM THE [facility] WORKFLOW SYSTEM   REASON FOR EXAM:    KNEE PAIN, WEIGHT BEARING  COMMENTS:   PROCEDURE:     KDR - KDXR KNEE RIGHT AP AND LATERAL  - May 07 2009 10:04AM   RESULT:     Images of the right knee demonstrate joint space narrowing and  hypertrophic marginal spurring. There is no fracture or bony destruction.   IMPRESSION:      No acute bony abnormality.       Knee-L DG 1-2 views:  Results for orders placed in visit on 05/07/09  DG Knee 1-2 Views Left   Narrative * PRIOR REPORT IMPORTED  FROM AN EXTERNAL SYSTEM *   PRIOR REPORT IMPORTED FROM THE [facility] WORKFLOW SYSTEM   REASON FOR EXAM:    KNEE PAIN, WEIGHT BEARING  COMMENTS:   PROCEDURE:     KDR - KDXR KNEE LEFT AP AND LATERAL  - May 07 2009 10:03AM   RESULT:     Images of the left knee show joint space narrowing with  hypertrophic spurring. No fracture is evident.   IMPRESSION:      Please see above.       Note: Results of ordered imaging test(s) reviewed and explained to patient in Layman's terms. Copy of results provided to patient  Meds  The patient has a current medication list which includes the following prescription(s): albuterol, allopurinol, amitriptyline, aspirin, bisoprolol, vitamin d3, clopidogrel, cyanocobalamin, gabapentin, ibuprofen, lisinopril, loratadine, lovastatin, oxycodone, vitamin c, vitamin d (ergocalciferol), and bupropion.  Current Outpatient Prescriptions on File Prior to Visit  Medication Sig  . albuterol (PROVENTIL HFA) 108 (90 Base) MCG/ACT inhaler Inhale 2 puffs into the lungs every 6 (six) hours as needed for wheezing or shortness of breath.  . allopurinol (ZYLOPRIM) 100 MG tablet Take 1 tablet (100 mg total) by mouth daily.  Marland Kitchen aspirin 81 MG tablet Take 81 mg by mouth daily.   . bisoprolol (ZEBETA) 10 MG tablet Take 1 tablet (10 mg total) by mouth daily.  . Cholecalciferol (VITAMIN D3) 2000 units capsule Take 1 capsule (2,000 Units total) by mouth daily.  . clopidogrel (PLAVIX) 75 MG tablet Take 1 tablet (75 mg total) by mouth daily.  . Cyanocobalamin (RA VITAMIN B-12 TR)  1000 MCG TBCR Take 2 tablets by mouth daily.   . Ibuprofen 200 MG CAPS Take by mouth.   Marland Kitchen lisinopril (PRINIVIL,ZESTRIL) 20 MG tablet Take 1 tablet (20 mg total) by mouth daily.  Marland Kitchen loratadine (CLARITIN) 10 MG tablet Take 10 mg by mouth daily as needed.   . lovastatin (MEVACOR) 40 MG tablet Take 1 and 1/2 Tablets Daily.  . vitamin C (ASCORBIC ACID) 500 MG tablet Take 500 mg by mouth.   . Vitamin D, Ergocalciferol,  (DRISDOL) 50000 units CAPS capsule Take 1 capsule (50,000 Units total) by mouth 2 (two) times a week x 6 weeks.  Marland Kitchen buPROPion (WELLBUTRIN SR) 150 MG 12 hr tablet 1 tablet daily for 3 days, then 1 tablet twice daily. Stop smoking 14 days after starting medication   No current facility-administered medications on file prior to visit.    ROS  Constitutional: Denies any fever or chills Gastrointestinal: No reported hemesis, hematochezia, vomiting, or acute GI distress Musculoskeletal: Denies any acute onset joint swelling, redness, loss of ROM, or weakness Neurological: No reported episodes of acute onset apraxia, aphasia, dysarthria, agnosia, amnesia, paralysis, loss of coordination, or loss of consciousness  Allergies  Ms. Juma is allergic to atorvastatin and omeprazole.  PFSH  Drug: Ms. Brandes  reports that she does not use drugs. Alcohol:  reports that she drinks alcohol. Tobacco:  reports that she has been smoking Cigarettes.  She has a 30.00 pack-year smoking history. She has never used smokeless tobacco. Medical:  has a past medical history of Allergy; Arthritis; Asthma; Depression; Diabetes mellitus without complication ([place]); Emphysema of lung ([place]); GERD (gastroesophageal reflux disease); Hyperlipidemia; Hypertension; Osteoporosis; and Tobacco abuse counseling. Family: family history includes Alcohol abuse in her father and sister; Breast cancer (age of onset: 77) in her sister; Cancer (age of onset: 43) in her sister; Coronary artery disease in her mother; Coronary artery disease (age of onset: 29) in her sister; Depression in her father; Heart attack in her mother and sister; Heart attack (age of onset: 69) in her father; Hyperlipidemia in her sister; Hypertension in her father, mother, and sister.  Past Surgical History:  Procedure Laterality Date  . Cardiac catheterization  3/210   70-80% stenosis RCA stent placed. started on Plavix  . [place]  . TUBAL LIGATION     . VAGINAL HYSTERECTOMY     Menometrorrhagia. Excessive bleeding  . vascular stent  03/28/2011   Dr. Delana Meyer, [facility]; Infrarenal   Constitutional Exam  General appearance: Well nourished, well developed, and well hydrated. In no apparent acute distress Vitals:   04/26/17 0935  BP: (!) 159/83  Pulse: 75  Resp: 16  Temp: 97.6 F (36.4 C)  TempSrc: Oral  SpO2: 98%  Weight: 268 lb (121.6 kg)  Height: 5' 8" (1.727 m)   BMI Assessment: Estimated body mass index is 40.75 kg/m as calculated from the following:   Height as of this encounter: 5' 8" (1.727 m).   Weight as of this encounter: 268 lb (121.6 kg).  BMI interpretation table: BMI level Category Range association with higher incidence of chronic pain  <18 kg/m2 Underweight   18.5-24.9 kg/m2 Ideal body weight   25-29.9 kg/m2 Overweight Increased incidence by 20%  30-34.9 kg/m2 Obese (Class I) Increased incidence by 68%  35-39.9 kg/m2 Severe obesity (Class II) Increased incidence by 136%  >40 kg/m2 Extreme obesity (Class III) Increased incidence by 254%   BMI Readings from Last 4 Encounters:  04/26/17 40.75 kg/m  04/11/17  42.42 kg/m  03/08/17 40.14 kg/m  02/22/17 41.05 kg/m   Wt Readings from Last 4 Encounters:  04/26/17 268 lb (121.6 kg)  04/11/17 279 lb (126.6 kg)  03/08/17 264 lb (119.7 kg)  02/22/17 270 lb (122.5 kg)  Psych/Mental status: Alert, oriented x 3 (person, place, & time)       Eyes: PERLA Respiratory: No evidence of acute respiratory distress  Cervical Spine Exam  Inspection: No masses, redness, or swelling Alignment: Symmetrical Functional ROM: Unrestricted ROM      Stability: No instability detected Muscle strength & Tone: Functionally intact Sensory: Unimpaired Palpation: No palpable anomalies              Upper Extremity (UE) Exam    Side: Right upper extremity  Side: Left upper extremity  Inspection: No masses, redness, swelling, or asymmetry. No contractures  Inspection: No masses,  redness, swelling, or asymmetry. No contractures  Functional ROM: Unrestricted ROM          Functional ROM: Unrestricted ROM          Muscle strength & Tone: Functionally intact  Muscle strength & Tone: Functionally intact  Sensory: Unimpaired  Sensory: Unimpaired  Palpation: No palpable anomalies              Palpation: No palpable anomalies              Specialized Test(s): Deferred         Specialized Test(s): Deferred          Thoracic Spine Exam  Inspection: No masses, redness, or swelling Alignment: Symmetrical Functional ROM: Unrestricted ROM Stability: No instability detected Sensory: Unimpaired Muscle strength & Tone: No palpable anomalies  Lumbar Spine Exam  Inspection: No masses, redness, or swelling Alignment: Symmetrical Functional ROM: Unrestricted ROM      Stability: No instability detected Muscle strength & Tone: Functionally intact Sensory: Unimpaired Palpation: No palpable anomalies       Provocative Tests: Lumbar Hyperextension and rotation test: evaluation deferred today       Patrick's Maneuver: evaluation deferred today                    Gait & Posture Assessment  Ambulation: Unassisted Gait: Relatively normal for age and body habitus Posture: WNL   Lower Extremity Exam    Side: Right lower extremity  Side: Left lower extremity  Inspection: No masses, redness, swelling, or asymmetry. No contractures  Inspection: No masses, redness, swelling, or asymmetry. No contractures  Functional ROM: Unrestricted ROM          Functional ROM: Unrestricted ROM          Muscle strength & Tone: Functionally intact  Muscle strength & Tone: Functionally intact  Sensory: Unimpaired  Sensory: Unimpaired  Palpation: No palpable anomalies  Palpation: No palpable anomalies   Assessment & Plan  Primary Diagnosis & Pertinent Problem List: The primary encounter diagnosis was Chronic low back pain (Location of Primary Source of Pain) (Bilateral) (L>R). Diagnoses of Chronic neck  pain (Location of Secondary source of pain) (Bilateral) (R>L), Chronic upper back pain (Location of Tertiary source of pain) (Bilateral) (R>L), Narrowing of intervertebral disc space, Lumbar facet syndrome (Bilateral) (L>R), Lumbar spondylosis, Lumbar facet hypertrophy (multilevel) (Bilateral), Chronic pain syndrome, Neurogenic pain, Neuropathy, Muscle spasm of back, Musculoskeletal pain, Long term current use of anticoagulant therapy, Long term prescription opiate use, and Opiate use were also pertinent to this visit.  Visit Diagnosis: 1. Chronic low back pain (Location of Primary Source  of Pain) (Bilateral) (L>R)   2. Chronic neck pain (Location of Secondary source of pain) (Bilateral) (R>L)   3. Chronic upper back pain (Location of Tertiary source of pain) (Bilateral) (R>L)   4. Narrowing of intervertebral disc space   5. Lumbar facet syndrome (Bilateral) (L>R)   6. Lumbar spondylosis   7. Lumbar facet hypertrophy (multilevel) (Bilateral)   8. Chronic pain syndrome   9. Neurogenic pain   10. Neuropathy   11. Muscle spasm of back   12. Musculoskeletal pain   13. Long term current use of anticoagulant therapy   14. Long term prescription opiate use   15. Opiate use    Problems updated and reviewed during this visit: Problem  Lumbar facet syndrome (Bilateral) (L>R)  Lumbar facet hypertrophy (multilevel) (Bilateral)  Neurogenic Pain  Musculoskeletal Pain  Gout  Long Term Current Use of Anticoagulant Therapy   Problem-specific Plan(s): No problem-specific Assessment & Plan notes found for this encounter.  Assessment & plan notes cannot be loaded without a specified hospital service.  Plan of Care  Pharmacotherapy (Medications Ordered): Meds ordered this encounter  Medications  . oxyCODONE (ROXICODONE) 5 MG immediate release tablet    Sig: Take 1 tablet (5 mg total) by mouth every 6 (six) hours as needed for severe pain.    Dispense:  15 tablet    Refill:  0    Fill one day  early if pharmacy is closed on scheduled refill date. Do not fill until: 04/26/17 To last until: 05/26/17  . amitriptyline (ELAVIL) 25 MG tablet    Sig: Take 1 tablet (25 mg total) by mouth at bedtime.    Dispense:  30 tablet    Refill:  0    Do not place medication on "Automatic Refill". Fill one day early if pharmacy is closed on scheduled refill date.  . gabapentin (NEURONTIN) 300 MG capsule    Sig: Take 1 capsule (300 mg total) by mouth 3 (three) times daily.    Dispense:  90 capsule    Refill:  0    Do not place medication on "Automatic Refill". Fill one day early if pharmacy is closed on scheduled refill date.   Lab-work, procedure(s), and/or referral(s): Orders Placed This Encounter  Procedures  . Platelet count  . Platelet function assay  . Protime-INR  . APTT  . Blood Thinner Instructions to Nursing  . Blood Thinner Instructions to Nursing    Pharmacotherapy: Opioid Analgesics: We'll take over management today. See above orders Membrane stabilizer: We have discussed the possibility of optimizing this mode of therapy, if tolerated Muscle relaxant: We have discussed the possibility of a trial NSAID: We have discussed the possibility of a trial Other analgesic(s): To be determined at a later time   Interventional therapies: Planned, scheduled, and/or pending:    Diagnostic bilateral lumbar facet block    Considering:   Diagnostic bilateral lumbar facet block  Possible bilateral lumbar facet RFA  Diagnostic right-sided cervical epidural steroid injection Diagnostic bilateral cervical facet block Possible bilateral cervical facet RFA  Diagnostic intra-articular hip joint injection Diagnostic bilateral femoral nerve and obturator nerve block Possible bilateral femoral nerve and obturator nerve RFA  Diagnostic right-sided occipital nerve block  Possible right-sided occipital nerve RFA  Possible right-sided occipital peripheral nerve stimulator trial    PRN  Procedures:   To be determined at a later time   Provider-requested follow-up: Return for (Blood-thinner Protocol), procedure (w/ sedation):, (ASAA), by MD, in addition, Med-Mgmt, (1 mo), w/ MD.  Future Appointments Date Time Provider [place]  05/01/2017 8:00 AM Birdie Sons, MD [facility] None  02/25/2018 8:30 AM [facility] 1 [facility] None  02/25/2018 9:00 AM [facility] None  02/25/2018 10:00 AM Schnier, Dolores Lory, MD [facility] None    Primary Care Physician: Birdie Sons, MD Location: [facility] Note by: Kathlen Brunswick. Dossie Arbour, M.D, DABA, DABAPM, DABPM, DABIPP, FIPP Date: 04/26/2017; Time: 1:53 PM  Patient instructions provided during this appointment: Patient Instructions   ____________________________________________________________________________________________  Pain Scale  Introduction: The pain score used by this practice is the Verbal Numerical Rating Scale (VNRS-11). This is an 11-point scale. It is for adults and children 10 years or older. There are significant differences in how the pain score is reported, used, and applied. Forget everything you learned in the past and learn this scoring system.  General Information: The scale should reflect your current level of pain. Unless you are specifically asked for the level of your worst pain, or your average pain. If you are asked for one of these two, then it should be understood that it is over the past 24 hours.  Basic Activities of Daily Living (ADL): Personal hygiene, dressing, eating, transferring, and using restroom.  Instructions: Most patients tend to report their level of pain as a combination of two factors, their physical pain and their psychosocial pain. This last one is also known as "suffering" and it is reflection of how physical pain affects you socially and psychologically. From now on, report them separately. From this point on, when asked to report your pain  level, report only your physical pain. Use the following table for reference.  Pain Clinic Pain Levels (0-5/10)  Pain Level Score  Description  No Pain 0   Mild pain 1 Nagging, annoying, but does not interfere with basic activities of daily living (ADL). Patients are able to eat, bathe, get dressed, toileting (being able to get on and off the toilet and perform personal hygiene functions), transfer (move in and out of bed or a chair without assistance), and maintain continence (able to control bladder and bowel functions). Blood pressure and heart rate are unaffected. A normal heart rate for a healthy adult ranges from 60 to 100 bpm (beats per minute).   Mild to moderate pain 2 Noticeable and distracting. Impossible to hide from other people. More frequent flare-ups. Still possible to adapt and function close to normal. It can be very annoying and may have occasional stronger flare-ups. With discipline, patients may get used to it and adapt.   Moderate pain 3 Interferes significantly with activities of daily living (ADL). It becomes difficult to feed, bathe, get dressed, get on and off the toilet or to perform personal hygiene functions. Difficult to get in and out of bed or a chair without assistance. Very distracting. With effort, it can be ignored when deeply involved in activities.   Moderately severe pain 4 Impossible to ignore for more than a few minutes. With effort, patients may still be able to manage work or participate in some social activities. Very difficult to concentrate. Signs of autonomic nervous system discharge are evident: dilated pupils (mydriasis); mild sweating (diaphoresis); sleep interference. Heart rate becomes elevated (>115 bpm). Diastolic blood pressure (lower number) rises above 100 mmHg. Patients find relief in laying down and not moving.   Severe pain 5 Intense and extremely unpleasant. Associated with frowning face and frequent crying. Pain overwhelms the senses.   Ability to do any  activity or maintain social relationships becomes significantly limited. Conversation becomes difficult. Pacing back and forth is common, as getting into a comfortable position is nearly impossible. Pain wakes you up from deep sleep. Physical signs will be obvious: pupillary dilation; increased sweating; goosebumps; brisk reflexes; cold, clammy hands and feet; nausea, vomiting or dry heaves; loss of appetite; significant sleep disturbance with inability to fall asleep or to remain asleep. When persistent, significant weight loss is observed due to the complete loss of appetite and sleep deprivation.  Blood pressure and heart rate becomes significantly elevated. Caution: If elevated blood pressure triggers a pounding headache associated with blurred vision, then the patient should immediately seek attention at an urgent or emergency care unit, as these may be signs of an impending stroke.    Emergency Department Pain Levels (6-10/10)  Emergency Room Pain 6 Severely limiting. Requires emergency care and should not be seen or managed at an outpatient pain management facility. Communication becomes difficult and requires great effort. Assistance to reach the emergency department may be required. Facial flushing and profuse sweating along with potentially dangerous increases in heart rate and blood pressure will be evident.   Distressing pain 7 Self-care is very difficult. Assistance is required to transport, or use restroom. Assistance to reach the emergency department will be required. Tasks requiring coordination, such as bathing and getting dressed become very difficult.   Disabling pain 8 Self-care is no longer possible. At this level, pain is disabling. The individual is unable to do even the most "basic" activities such as walking, eating, bathing, dressing, transferring to a bed, or toileting. Fine motor skills are lost. It is difficult to think clearly.   Incapacitating pain 9 Pain  becomes incapacitating. Thought processing is no longer possible. Difficult to remember your own name. Control of movement and coordination are lost.   The worst pain imaginable 10 At this level, most patients pass out from pain. When this level is reached, collapse of the autonomic nervous system occurs, leading to a sudden drop in blood pressure and heart rate. This in turn results in a temporary and dramatic drop in blood flow to the brain, leading to a loss of consciousness. Fainting is one of the body's self defense mechanisms. Passing out puts the brain in a calmed state and causes it to shut down for a while, in order to begin the healing process.    Summary: 1. Refer to this scale when providing [place] with your pain level. 2. Be accurate and careful when reporting your pain level. This will help with your care. 3. Over-reporting your pain level will lead to loss of credibility. 4. Even a level of 1/10 means that there is pain and will be treated at our facility. 5. High, inaccurate reporting will be documented as "Symptom Exaggeration", leading to loss of credibility and suspicions of possible secondary gains such as obtaining more narcotics, or wanting to appear disabled, for fraudulent reasons. 6. Only pain levels of 5 or below will be seen at our facility. 7. Pain levels of 6 and above will be sent to the Emergency Department and the appointment cancelled. _____________________________________________________________________________________________  ____________________________________________________________________________________________  Appointment Policy  It is our goal and responsibility to provide the medical community with assistance in the evaluation and management of patients with chronic pain. Unfortunately our resources are limited. Because we do not have an unlimited amount of time, or available appointments, we are required to closely monitor and manage their use. The  following rules exist to  maximize their use:  Patient's responsibilities: 1. Punctuality: You are required to be physically present and registered in our facility at least 30 minutes before your appointment. 2. Tardiness: The cutoff is your appointment time. If you have an appointment scheduled for 10:00 AM and you arrive at 10:01, you will be required to reschedule your appointment.  3. Plan ahead: Always assume that you will encounter traffic on your way in. Plan for it. If you are dependent on a driver, make sure they understand these rules and the need to arrive early. 4. Other appointments and responsibilities: Avoid scheduling any other appointments before or after your pain clinic appointments.  5. Be prepared: Write down everything that you need to discuss with your healthcare provider and give this information to the admitting nurse. Write down the medications that you will need refilled. Bring your pills and bottles (even the empty ones), to all of your appointments, except for those where a procedure is scheduled. 6. No children or pets: Find someone to take care of them. It is not appropriate to bring them in. 7. Scheduling changes: We request "advanced notification" of any changes or cancellations. 8. Advanced notification: Defined as a time period of more than 24 hours prior to the originally scheduled appointment. This allows for the appointment to be offered to other patients. 9. Rescheduling: When a visit is rescheduled, it will require the cancellation of the original appointment. For this reason they both fall within the category of "Cancellations".  10. Cancellations: They require advanced notification. Any cancellation less than 24 hours before the  appointment will be recorded as a "No Show". 11. No Show: Defined as an unkept appointment where the patient failed to notify or declare to the practice their intention or inability to keep the appointment.  Corrective process for  repeat offenders:  1. Tardiness: Three (3) episodes of rescheduling due to late arrivals will be recorded as one (1) "No Show". 2. Cancellation or reschedule: Three (3) cancellations or rescheduling will be recorded as one (1) "No Show". 3. "No Shows": Three (3) "No Shows" within a 12 month period will result in discharge from the practice.  ____________________________________________________________________________________________  ____________________________________________________________________________________________  Medication Rules  Applies to: All patients receiving prescriptions (written or electronic).  Pharmacy of record: Pharmacy where electronic prescriptions will be sent. If written prescriptions are taken to a different pharmacy, please inform the nursing staff. The pharmacy listed in the electronic medical record should be the one where you would like electronic prescriptions to be sent.  Prescription refills: Only during scheduled appointments. Applies to both, written and electronic prescriptions.  NOTE: The following applies primarily to controlled substances (Opioid Pain Medications)  Patient's responsibilities: 12. Pain Pills: Bring all pain pills to every appointment (except for procedure appointments). 13. Pill Bottles: Bring pills in original pharmacy bottle. Always bring newest bottle. Bring bottle, even if empty. 14. Medication refills: You are responsible for knowing and keeping track of what medications you need refilled. The day before your appointment, write a list of all prescriptions that need to be refilled. Bring that list to your appointment and give it to the admitting nurse. Prescriptions will be written only during appointments. If you forget a medication, it will not be "Called in", "Faxed", or "electronically sent". You will need to get another appointment to get these prescribed. 15. Prescription Accuracy: You are responsible for carefully  inspecting your prescriptions before leaving our office. Have the discharge nurse carefully go over each prescription with you, before  taking them home. Make sure that your name is accurately spelled, that your address is correct. Check the name and dose of your medication to make sure it is accurate. Check the number of pills, and the written instructions to make sure they are clear and accurate. Make sure that you are given enough medication to last until your next medication refill appointment. 16. Taking Medication: Take medication as prescribed. Never take more pills than instructed. Never take medication more frequently than prescribed. Taking less pills or less frequently is permitted and encouraged, when it comes to controlled substances (written prescriptions).  46. Inform other Doctors: Always inform, all of your healthcare providers, of all the medications you take. 18. Pain Medication from other Providers: You are not allowed to accept any additional pain medication from any other Doctor or Healthcare provider. There are two exceptions to this rule. (see below) In the event that you require additional pain medication, you are responsible for notifying us, as stated below. 19. Medication Agreement: You are responsible for carefully reading and following our Medication Agreement. This must be signed before receiving any prescriptions from our practice. Safely store a copy of your signed Agreement. Violations to the Agreement will result in no further prescriptions. (Additional copies of our Medication Agreement are available upon request.) 20. Laws, Rules, & Regulations: All patients are expected to follow all Federal and [employer], [employer], Rules, [employer] [facility]. Ignorance of the Laws does not constitute a valid excuse.  Exceptions: There are only two exceptions to the rule of not receiving pain medications from other Healthcare Providers. 4. Exception #1 (Emergencies): In the event of an  emergency (i.e.: accident requiring emergency care), you are allowed to receive additional pain medication. However, you are responsible for: As soon as you are able, call our office [phone], at any time of the day or night, and leave a message stating your name, the date and nature of the emergency, and the name and dose of the medication prescribed. In the event that your call is answered by a member of our staff, make sure to document and save the date, time, and the name of the person that took your information.  5. Exception #2 (Planned Surgery): In the event that you are scheduled by another doctor or dentist to have any type of surgery or procedure, you are allowed (for a period no longer than 30 days), to receive additional pain medication, for the acute post-op pain. However, in this case, you are responsible for picking up a copy of our "Post-op Pain Management for Surgeons" handout, and giving it to your surgeon or dentist. This document is available at our office, and does not require an appointment to obtain it. Simply go to our office during business hours (Monday-Thursday from 8:00 AM to 4:00 PM) (Friday 8:00 AM to 12:00 Noon) or if you have a scheduled appointment with [place], prior to your surgery, and ask for it by name. In addition, you will need to provide [place] with your name, name of your surgeon, type of surgery, and date of procedure or surgery.  _____________________________________________________________________________________________  GENERAL RISKS AND COMPLICATIONS  What are the risk, side effects and possible complications? Generally speaking, most procedures are safe.  However, with any procedure there are risks, side effects, and the possibility of complications.  The risks and complications are dependent upon the sites that are lesioned, or the type of nerve block to be performed.  The closer the procedure is to the spine, the  more serious the risks are.  Great care is taken  when placing the radio frequency needles, block needles or lesioning probes, but sometimes complications can occur. 1. Infection: Any time there is an injection through the skin, there is a risk of infection.  This is why sterile conditions are used for these blocks.  There are four possible types of infection. 1. Localized skin infection. 2. Central Nervous System Infection-This can be in the form of Meningitis, which can be deadly. 3. Epidural Infections-This can be in the form of an epidural abscess, which can cause pressure inside of the spine, causing compression of the spinal cord with subsequent paralysis. This would require an emergency surgery to decompress, and there are no guarantees that the patient would recover from the paralysis. 4. Discitis-This is an infection of the intervertebral discs.  It occurs in about 1% of discography procedures.  It is difficult to treat and it may lead to surgery.        2. Pain: the needles have to go through skin and soft tissues, will cause soreness.       3. Damage to internal structures:  The nerves to be lesioned may be near blood vessels or    other nerves which can be potentially damaged.       4. Bleeding: Bleeding is more common if the patient is taking blood thinners such as  aspirin, Coumadin, Ticiid, Plavix, etc., or if he/she have some genetic predisposition  such as hemophilia. Bleeding into the spinal canal can cause compression of the spinal  cord with subsequent paralysis.  This would require an emergency surgery to  decompress and there are no guarantees that the patient would recover from the  paralysis.       5. Pneumothorax:  Puncturing of a lung is a possibility, every time a needle is introduced in  the area of the chest or upper back.  Pneumothorax refers to free air around the  collapsed lung(s), inside of the thoracic cavity (chest cavity).  Another two possible  complications related to a similar event would include: Hemothorax and  Chylothorax.   These are variations of the Pneumothorax, where instead of air around the collapsed  lung(s), you may have blood or chyle, respectively.       6. Spinal headaches: They may occur with any procedures in the area of the spine.       7. Persistent CSF (Cerebro-Spinal Fluid) leakage: This is a rare problem, but may occur  with prolonged intrathecal or epidural catheters either due to the formation of a fistulous  track or a dural tear.       8. Nerve damage: By working so close to the spinal cord, there is always a possibility of  nerve damage, which could be as serious as a permanent spinal cord injury with  paralysis.       9. Death:  Although rare, severe deadly allergic reactions known as "Anaphylactic  reaction" can occur to any of the medications used.      10. Worsening of the symptoms:  We can always make thing worse.  What are the chances of something like this happening? Chances of any of this occuring are extremely low.  By statistics, you have more of a chance of getting killed in a motor vehicle accident: while driving to the hospital than any of the above occurring .  Nevertheless, you should be aware that they are possibilities.  In general, it is similar to taking  a shower.  Everybody knows that you can slip, hit your head and get killed.  Does that mean that you should not shower again?  Nevertheless always keep in mind that statistics do not mean anything if you happen to be on the wrong side of them.  Even if a procedure has a 1 (one) in a 1,000,000 (million) chance of going wrong, it you happen to be that one..Also, keep in mind that by statistics, you have more of a chance of having something go wrong when taking medications.  Who should not have this procedure? If you are on a blood thinning medication (e.g. Coumadin, Plavix, see list of "Blood Thinners"), or if you have an active infection going on, you should not have the procedure.  If you are taking any blood thinners,  please inform your physician.  How should I prepare for this procedure?  Do not eat or drink anything at least six hours prior to the procedure.  Bring a driver with you .  It cannot be a taxi.  Come accompanied by an adult that can drive you back, and that is strong enough to help you if your legs get weak or numb from the local anesthetic.  Take all of your medicines the morning of the procedure with just enough water to swallow them.  If you have diabetes, make sure that you are scheduled to have your procedure done first thing in the morning, whenever possible.  If you have diabetes, take only half of your insulin dose and notify our nurse that you have done so as soon as you arrive at the clinic.  If you are diabetic, but only take blood sugar pills (oral hypoglycemic), then do not take them on the morning of your procedure.  You may take them after you have had the procedure.  Do not take aspirin or any aspirin-containing medications, at least eleven (11) days prior to the procedure.  They may prolong bleeding.  Wear loose fitting clothing that may be easy to take off and that you would not mind if it got stained with Betadine or blood.  Do not wear any jewelry or perfume  Remove any nail coloring.  It will interfere with some of our monitoring equipment.  NOTE: Remember that this is not meant to be interpreted as a complete list of all possible complications.  Unforeseen problems may occur.  BLOOD THINNERS The following drugs contain aspirin or other products, which can cause increased bleeding during surgery and should not be taken for 2 weeks prior to and 1 week after surgery.  If you should need take something for relief of minor pain, you may take acetaminophen which is found in Tylenol,m Datril, Anacin-3 and Panadol. It is not blood thinner. The products listed below are.  Do not take any of the products listed below in addition to any listed on your instruction  sheet.  A.P.C or A.P.C with Codeine Codeine Phosphate Capsules #3 Ibuprofen Ridaura  ABC compound Congesprin Imuran rimadil  Advil Cope Indocin Robaxisal  Alka-Seltzer Effervescent Pain Reliever and Antacid Coricidin or Coricidin-D  Indomethacin Rufen  Alka-Seltzer plus Cold Medicine Cosprin Ketoprofen S-A-C Tablets  Anacin Analgesic Tablets or Capsules Coumadin Korlgesic Salflex  Anacin Extra Strength Analgesic tablets or capsules CP-2 Tablets Lanoril Salicylate  Anaprox Cuprimine Capsules Levenox Salocol  Anexsia-D Dalteparin Magan Salsalate  Anodynos Darvon compound Magnesium Salicylate Sine-off  Ansaid Dasin Capsules Magsal Sodium Salicylate  Anturane Depen Capsules Marnal Soma  APF Arthritis pain formula  Dewitt's Pills Measurin Stanback  Argesic Dia-Gesic Meclofenamic Sulfinpyrazone  Arthritis Bayer Timed Release Aspirin Diclofenac Meclomen Sulindac  Arthritis pain formula Anacin Dicumarol Medipren Supac  Analgesic (Safety coated) Arthralgen Diffunasal Mefanamic Suprofen  Arthritis Strength Bufferin Dihydrocodeine Mepro Compound Suprol  Arthropan liquid Dopirydamole Methcarbomol with Aspirin Synalgos  ASA tablets/Enseals Disalcid Micrainin Tagament  Ascriptin Doan's Midol Talwin  Ascriptin A/D Dolene Mobidin Tanderil  Ascriptin Extra Strength Dolobid Moblgesic Ticlid  Ascriptin with Codeine Doloprin or Doloprin with Codeine Momentum Tolectin  Asperbuf Duoprin Mono-gesic Trendar  Aspergum Duradyne Motrin or Motrin IB Triminicin  Aspirin plain, buffered or enteric coated Durasal Myochrisine Trigesic  Aspirin Suppositories Easprin Nalfon Trillsate  Aspirin with Codeine Ecotrin Regular or Extra Strength Naprosyn Uracel  Atromid-S Efficin Naproxen Ursinus  Auranofin Capsules Elmiron Neocylate Vanquish  Axotal Emagrin Norgesic Verin  Azathioprine Empirin or Empirin with Codeine Normiflo Vitamin E  Azolid Emprazil Nuprin Voltaren  Bayer Aspirin plain, buffered or children's or  timed BC Tablets or powders Encaprin Orgaran Warfarin Sodium  Buff-a-Comp Enoxaparin Orudis Zorpin  Buff-a-Comp with Codeine Equegesic Os-Cal-Gesic   Buffaprin Excedrin plain, buffered or Extra Strength Oxalid   Bufferin Arthritis Strength Feldene Oxphenbutazone   Bufferin plain or Extra Strength Feldene Capsules Oxycodone with Aspirin   Bufferin with Codeine Fenoprofen Fenoprofen Pabalate or Pabalate-SF   Buffets II Flogesic Panagesic   Buffinol plain or Extra Strength Florinal or Florinal with Codeine Panwarfarin   Buf-Tabs Flurbiprofen Penicillamine   Butalbital Compound Four-way cold tablets Penicillin   Butazolidin Fragmin Pepto-Bismol   Carbenicillin Geminisyn Percodan   Carna Arthritis Reliever Geopen Persantine   Carprofen Gold's salt Persistin   Chloramphenicol Goody's Phenylbutazone   Chloromycetin Haltrain Piroxlcam   Clmetidine heparin Plaquenil   Cllnoril Hyco-pap Ponstel   Clofibrate Hydroxy chloroquine Propoxyphen         Before stopping any of these medications, be sure to consult the physician who ordered them.  Some, such as Coumadin (Warfarin) are ordered to prevent or treat serious conditions such as "deep thrombosis", "pumonary embolisms", and other heart problems.  The amount of time that you may need off of the medication may also vary with the medication and the reason for which you were taking it.  If you are taking any of these medications, please make sure you notify your pain physician before you undergo any procedures.          Facet Joint Block The facet joints connect the bones of the spine (vertebrae). They make it possible for you to bend, twist, and make other movements with your spine. They also keep you from bending too far, twisting too far, and making other excessive movements. A facet joint block is a procedure where a numbing medicine (anesthetic) is injected into a facet joint. Often, a type of anti-inflammatory medicine called a steroid is  also injected. A facet joint block may be done to diagnose neck or back pain. If the pain gets better after a facet joint block, it means the pain is probably coming from the facet joint. If the pain does not get better, it means the pain is probably not coming from the facet joint. A facet joint block may also be done to relieve neck or back pain caused by an inflamed facet joint. A facet joint block is only done to relieve pain if the pain does not improve with other methods, such as medicine, exercise programs, and physical therapy. Tell a health care provider about: Any allergies you have. All medicines you  are taking, including vitamins, herbs, eye drops, creams, and over-the-counter medicines. Any problems you or family members have had with anesthetic medicines. Any blood disorders you have. Any surgeries you have had. Any medical conditions you have. Whether you are pregnant or may be pregnant. What are the risks? Generally, this is a safe procedure. However, problems may occur, including: Bleeding. Injury to a nerve near the injection site. Pain at the injection site. Weakness or numbness in areas controlled by nerves near the injection site. Infection. Temporary fluid retention. Allergic reactions to medicines or dyes. Injury to other structures or organs near the injection site. What happens before the procedure? Follow instructions from your health care provider about eating or drinking restrictions. Ask your health care provider about: Changing or stopping your regular medicines. This is especially important if you are taking diabetes medicines or blood thinners. Taking medicines such as aspirin and ibuprofen. These medicines can thin your blood. Do not take these medicines before your procedure if your health care provider instructs you not to. Do not take any new dietary supplements or medicines without asking your health care provider first. Plan to have someone take you home  after the procedure. What happens during the procedure? You may need to remove your clothing and dress in an open-back gown. The procedure will be done while you are lying on an X-ray table. You will most likely be asked to lie on your stomach, but you may be asked to lie in a different position if an injection will be made in your neck. Machines will be used to monitor your oxygen levels, heart rate, and blood pressure. If an injection will be made in your neck, an IV tube will be inserted into one of your veins. Fluids and medicine will flow directly into your body through the IV tube. The area over the facet joint where the injection will be made will be cleaned with soap. The surrounding skin will be covered with clean drapes. A numbing medicine (local anesthetic) will be applied to your skin. Your skin may sting or burn for a moment. A video X-ray machine (fluoroscopy) will be used to locate the joint. In some cases, a CT scan may be used. A contrast dye may be injected into the facet joint area to help locate the joint. When the joint is located, an anesthetic will be injected into the joint through the needle. Your health care provider will ask you whether you feel pain relief. If you do feel relief, a steroid may be injected to provide pain relief for a longer period of time. If you do not feel relief or feel only partial relief, additional injections of an anesthetic may be made in other facet joints. The needle will be removed. Your skin will be cleaned. A bandage (dressing) will be applied over each injection site. The procedure may vary among health care providers and hospitals. What happens after the procedure? You will be observed for 15-30 minutes before being allowed to go home. This information is not intended to replace advice given to you by your health care provider. Make sure you discuss any questions you have with your health care provider. Document Released: 04/11/2007  Document Revised: 12/22/2015 Document Reviewed: 08/16/2015 Elsevier Interactive Patient Education  2017 [employer].

## 2017-04-26 NOTE — Progress Notes (Signed)
Wasted Oxycodone 5mg  filled 02/11/15  10tabs down commode witnessed by [email or web address] pt and myself

## 2017-04-26 NOTE — Patient Instructions (Addendum)
____________________________________________________________________________________________  Pain Scale  Introduction: The pain score used by this practice is the Verbal Numerical Rating Scale (VNRS-11). This is an 11-point scale. It is for adults and children 10 years or older. There are significant differences in how the pain score is reported, used, and applied. Forget everything you learned in the past and learn this scoring system.  General Information: The scale should reflect your current level of pain. Unless you are specifically asked for the level of your worst pain, or your average pain. If you are asked for one of these two, then it should be understood that it is over the past 24 hours.  Basic Activities of Daily Living (ADL): Personal hygiene, dressing, eating, transferring, and using restroom.  Instructions: Most patients tend to report their level of pain as a combination of two factors, their physical pain and their psychosocial pain. This last one is also known as "suffering" and it is reflection of how physical pain affects you socially and psychologically. From now on, report them separately. From this point on, when asked to report your pain level, report only your physical pain. Use the following table for reference.  Pain Clinic Pain Levels (0-5/10)  Pain Level Score  Description  No Pain 0   Mild pain 1 Nagging, annoying, but does not interfere with basic activities of daily living (ADL). Patients are able to eat, bathe, get dressed, toileting (being able to get on and off the toilet and perform personal hygiene functions), transfer (move in and out of bed or a chair without assistance), and maintain continence (able to control bladder and bowel functions). Blood pressure and heart rate are unaffected. A normal heart rate for a healthy adult ranges from 60 to 100 bpm (beats per minute).   Mild to moderate pain 2 Noticeable and distracting. Impossible to hide from other  people. More frequent flare-ups. Still possible to adapt and function close to normal. It can be very annoying and may have occasional stronger flare-ups. With discipline, patients may get used to it and adapt.   Moderate pain 3 Interferes significantly with activities of daily living (ADL). It becomes difficult to feed, bathe, get dressed, get on and off the toilet or to perform personal hygiene functions. Difficult to get in and out of bed or a chair without assistance. Very distracting. With effort, it can be ignored when deeply involved in activities.   Moderately severe pain 4 Impossible to ignore for more than a few minutes. With effort, patients may still be able to manage work or participate in some social activities. Very difficult to concentrate. Signs of autonomic nervous system discharge are evident: dilated pupils (mydriasis); mild sweating (diaphoresis); sleep interference. Heart rate becomes elevated (>115 bpm). Diastolic blood pressure (lower number) rises above 100 mmHg. Patients find relief in laying down and not moving.   Severe pain 5 Intense and extremely unpleasant. Associated with frowning face and frequent crying. Pain overwhelms the senses.  Ability to do any activity or maintain social relationships becomes significantly limited. Conversation becomes difficult. Pacing back and forth is common, as getting into a comfortable position is nearly impossible. Pain wakes you up from deep sleep. Physical signs will be obvious: pupillary dilation; increased sweating; goosebumps; brisk reflexes; cold, clammy hands and feet; nausea, vomiting or dry heaves; loss of appetite; significant sleep disturbance with inability to fall asleep or to remain asleep. When persistent, significant weight loss is observed due to the complete loss of appetite and sleep deprivation.  Blood   pressure and heart rate becomes significantly elevated. Caution: If elevated blood pressure triggers a pounding headache  associated with blurred vision, then the patient should immediately seek attention at an urgent or emergency care unit, as these may be signs of an impending stroke.    Emergency Department Pain Levels (6-10/10)  Emergency Room Pain 6 Severely limiting. Requires emergency care and should not be seen or managed at an outpatient pain management facility. Communication becomes difficult and requires great effort. Assistance to reach the emergency department may be required. Facial flushing and profuse sweating along with potentially dangerous increases in heart rate and blood pressure will be evident.   Distressing pain 7 Self-care is very difficult. Assistance is required to transport, or use restroom. Assistance to reach the emergency department will be required. Tasks requiring coordination, such as bathing and getting dressed become very difficult.   Disabling pain 8 Self-care is no longer possible. At this level, pain is disabling. The individual is unable to do even the most "basic" activities such as walking, eating, bathing, dressing, transferring to a bed, or toileting. Fine motor skills are lost. It is difficult to think clearly.   Incapacitating pain 9 Pain becomes incapacitating. Thought processing is no longer possible. Difficult to remember your own name. Control of movement and coordination are lost.   The worst pain imaginable 10 At this level, most patients pass out from pain. When this level is reached, collapse of the autonomic nervous system occurs, leading to a sudden drop in blood pressure and heart rate. This in turn results in a temporary and dramatic drop in blood flow to the brain, leading to a loss of consciousness. Fainting is one of the body's self defense mechanisms. Passing out puts the brain in a calmed state and causes it to shut down for a while, in order to begin the healing process.    Summary: 1. Refer to this scale when providing [place] with your pain level. 2. Be  accurate and careful when reporting your pain level. This will help with your care. 3. Over-reporting your pain level will lead to loss of credibility. 4. Even a level of 1/10 means that there is pain and will be treated at our facility. 5. High, inaccurate reporting will be documented as "Symptom Exaggeration", leading to loss of credibility and suspicions of possible secondary gains such as obtaining more narcotics, or wanting to appear disabled, for fraudulent reasons. 6. Only pain levels of 5 or below will be seen at our facility. 7. Pain levels of 6 and above will be sent to the Emergency Department and the appointment cancelled. _____________________________________________________________________________________________  ____________________________________________________________________________________________  Appointment Policy  It is our goal and responsibility to provide the medical community with assistance in the evaluation and management of patients with chronic pain. Unfortunately our resources are limited. Because we do not have an unlimited amount of time, or available appointments, we are required to closely monitor and manage their use. The following rules exist to maximize their use:  Patient's responsibilities: 1. Punctuality: You are required to be physically present and registered in our facility at least 30 minutes before your appointment. 2. Tardiness: The cutoff is your appointment time. If you have an appointment scheduled for 10:00 AM and you arrive at 10:01, you will be required to reschedule your appointment.  3. Plan ahead: Always assume that you will encounter traffic on your way in. Plan for it. If you are dependent on a driver, make sure they understand these rules and  the need to arrive early. 4. Other appointments and responsibilities: Avoid scheduling any other appointments before or after your pain clinic appointments.  5. Be prepared: Write down everything  that you need to discuss with your healthcare provider and give this information to the admitting nurse. Write down the medications that you will need refilled. Bring your pills and bottles (even the empty ones), to all of your appointments, except for those where a procedure is scheduled. 6. No children or pets: Find someone to take care of them. It is not appropriate to bring them in. 7. Scheduling changes: We request "advanced notification" of any changes or cancellations. 8. Advanced notification: Defined as a time period of more than 24 hours prior to the originally scheduled appointment. This allows for the appointment to be offered to other patients. 9. Rescheduling: When a visit is rescheduled, it will require the cancellation of the original appointment. For this reason they both fall within the category of "Cancellations".  10. Cancellations: They require advanced notification. Any cancellation less than 24 hours before the  appointment will be recorded as a "No Show". 11. No Show: Defined as an unkept appointment where the patient failed to notify or declare to the practice their intention or inability to keep the appointment.  Corrective process for repeat offenders:  1. Tardiness: Three (3) episodes of rescheduling due to late arrivals will be recorded as one (1) "No Show". 2. Cancellation or reschedule: Three (3) cancellations or rescheduling will be recorded as one (1) "No Show". 3. "No Shows": Three (3) "No Shows" within a 12 month period will result in discharge from the practice.  ____________________________________________________________________________________________  ____________________________________________________________________________________________  Medication Rules  Applies to: All patients receiving prescriptions (written or electronic).  Pharmacy of record: Pharmacy where electronic prescriptions will be sent. If written prescriptions are taken to a different  pharmacy, please inform the nursing staff. The pharmacy listed in the electronic medical record should be the one where you would like electronic prescriptions to be sent.  Prescription refills: Only during scheduled appointments. Applies to both, written and electronic prescriptions.  NOTE: The following applies primarily to controlled substances (Opioid Pain Medications)  Patient's responsibilities: 12. Pain Pills: Bring all pain pills to every appointment (except for procedure appointments). 13. Pill Bottles: Bring pills in original pharmacy bottle. Always bring newest bottle. Bring bottle, even if empty. 14. Medication refills: You are responsible for knowing and keeping track of what medications you need refilled. The day before your appointment, write a list of all prescriptions that need to be refilled. Bring that list to your appointment and give it to the admitting nurse. Prescriptions will be written only during appointments. If you forget a medication, it will not be "Called in", "Faxed", or "electronically sent". You will need to get another appointment to get these prescribed. 15. Prescription Accuracy: You are responsible for carefully inspecting your prescriptions before leaving our office. Have the discharge nurse carefully go over each prescription with you, before taking them home. Make sure that your name is accurately spelled, that your address is correct. Check the name and dose of your medication to make sure it is accurate. Check the number of pills, and the written instructions to make sure they are clear and accurate. Make sure that you are given enough medication to last until your next medication refill appointment. 16. Taking Medication: Take medication as prescribed. Never take more pills than instructed. Never take medication more frequently than prescribed. Taking less pills or less frequently  is permitted and encouraged, when it comes to controlled substances (written  prescriptions).  43. Inform other Doctors: Always inform, all of your healthcare providers, of all the medications you take. 18. Pain Medication from other Providers: You are not allowed to accept any additional pain medication from any other Doctor or Healthcare provider. There are two exceptions to this rule. (see below) In the event that you require additional pain medication, you are responsible for notifying us, as stated below. 19. Medication Agreement: You are responsible for carefully reading and following our Medication Agreement. This must be signed before receiving any prescriptions from our practice. Safely store a copy of your signed Agreement. Violations to the Agreement will result in no further prescriptions. (Additional copies of our Medication Agreement are available upon request.) 20. Laws, Rules, & Regulations: All patients are expected to follow all Federal and [employer], Rules, [employer]. Ignorance of the Laws does not constitute a valid excuse.  Exceptions: There are only two exceptions to the rule of not receiving pain medications from other Healthcare Providers. 4. Exception #1 (Emergencies): In the event of an emergency (i.e.: accident requiring emergency care), you are allowed to receive additional pain medication. However, you are responsible for: As soon as you are able, call our office [phone], at any time of the day or night, and leave a message stating your name, the date and nature of the emergency, and the name and dose of the medication prescribed. In the event that your call is answered by a member of our staff, make sure to document and save the date, time, and the name of the person that took your information.  5. Exception #2 (Planned Surgery): In the event that you are scheduled by another doctor or dentist to have any type of surgery or procedure, you are allowed (for a period no longer than 30 days), to receive additional pain medication, for  the acute post-op pain. However, in this case, you are responsible for picking up a copy of our "Post-op Pain Management for Surgeons" handout, and giving it to your surgeon or dentist. This document is available at our office, and does not require an appointment to obtain it. Simply go to our office during business hours (Monday-Thursday from 8:00 AM to 4:00 PM) (Friday 8:00 AM to 12:00 Noon) or if you have a scheduled appointment with [place], prior to your surgery, and ask for it by name. In addition, you will need to provide [place] with your name, name of your surgeon, type of surgery, and date of procedure or surgery.  _____________________________________________________________________________________________  GENERAL RISKS AND COMPLICATIONS  What are the risk, side effects and possible complications? Generally speaking, most procedures are safe.  However, with any procedure there are risks, side effects, and the possibility of complications.  The risks and complications are dependent upon the sites that are lesioned, or the type of nerve block to be performed.  The closer the procedure is to the spine, the more serious the risks are.  Great care is taken when placing the radio frequency needles, block needles or lesioning probes, but sometimes complications can occur. 1. Infection: Any time there is an injection through the skin, there is a risk of infection.  This is why sterile conditions are used for these blocks.  There are four possible types of infection. 1. Localized skin infection. 2. Central Nervous System Infection-This can be in the form of Meningitis, which can be deadly. 3. Epidural Infections-This can be in  the form of an epidural abscess, which can cause pressure inside of the spine, causing compression of the spinal cord with subsequent paralysis. This would require an emergency surgery to decompress, and there are no guarantees that the patient would recover from the  paralysis. 4. Discitis-This is an infection of the intervertebral discs.  It occurs in about 1% of discography procedures.  It is difficult to treat and it may lead to surgery.        2. Pain: the needles have to go through skin and soft tissues, will cause soreness.       3. Damage to internal structures:  The nerves to be lesioned may be near blood vessels or    other nerves which can be potentially damaged.       4. Bleeding: Bleeding is more common if the patient is taking blood thinners such as  aspirin, Coumadin, Ticiid, Plavix, etc., or if he/she have some genetic predisposition  such as hemophilia. Bleeding into the spinal canal can cause compression of the spinal  cord with subsequent paralysis.  This would require an emergency surgery to  decompress and there are no guarantees that the patient would recover from the  paralysis.       5. Pneumothorax:  Puncturing of a lung is a possibility, every time a needle is introduced in  the area of the chest or upper back.  Pneumothorax refers to free air around the  collapsed lung(s), inside of the thoracic cavity (chest cavity).  Another two possible  complications related to a similar event would include: Hemothorax and Chylothorax.   These are variations of the Pneumothorax, where instead of air around the collapsed  lung(s), you may have blood or chyle, respectively.       6. Spinal headaches: They may occur with any procedures in the area of the spine.       7. Persistent CSF (Cerebro-Spinal Fluid) leakage: This is a rare problem, but may occur  with prolonged intrathecal or epidural catheters either due to the formation of a fistulous  track or a dural tear.       8. Nerve damage: By working so close to the spinal cord, there is always a possibility of  nerve damage, which could be as serious as a permanent spinal cord injury with  paralysis.       9. Death:  Although rare, severe deadly allergic reactions known as "Anaphylactic  reaction" can  occur to any of the medications used.      10. Worsening of the symptoms:  We can always make thing worse.  What are the chances of something like this happening? Chances of any of this occuring are extremely low.  By statistics, you have more of a chance of getting killed in a motor vehicle accident: while driving to the hospital than any of the above occurring .  Nevertheless, you should be aware that they are possibilities.  In general, it is similar to taking a shower.  Everybody knows that you can slip, hit your head and get killed.  Does that mean that you should not shower again?  Nevertheless always keep in mind that statistics do not mean anything if you happen to be on the wrong side of them.  Even if a procedure has a 1 (one) in a 1,000,000 (million) chance of going wrong, it you happen to be that one..Also, keep in mind that by statistics, you have more of a chance of having something go  wrong when taking medications.  Who should not have this procedure? If you are on a blood thinning medication (e.g. Coumadin, Plavix, see list of "Blood Thinners"), or if you have an active infection going on, you should not have the procedure.  If you are taking any blood thinners, please inform your physician.  How should I prepare for this procedure?  Do not eat or drink anything at least six hours prior to the procedure.  Bring a driver with you .  It cannot be a taxi.  Come accompanied by an adult that can drive you back, and that is strong enough to help you if your legs get weak or numb from the local anesthetic.  Take all of your medicines the morning of the procedure with just enough water to swallow them.  If you have diabetes, make sure that you are scheduled to have your procedure done first thing in the morning, whenever possible.  If you have diabetes, take only half of your insulin dose and notify our nurse that you have done so as soon as you arrive at the clinic.  If you are  diabetic, but only take blood sugar pills (oral hypoglycemic), then do not take them on the morning of your procedure.  You may take them after you have had the procedure.  Do not take aspirin or any aspirin-containing medications, at least eleven (11) days prior to the procedure.  They may prolong bleeding.  Wear loose fitting clothing that may be easy to take off and that you would not mind if it got stained with Betadine or blood.  Do not wear any jewelry or perfume  Remove any nail coloring.  It will interfere with some of our monitoring equipment.  NOTE: Remember that this is not meant to be interpreted as a complete list of all possible complications.  Unforeseen problems may occur.  BLOOD THINNERS The following drugs contain aspirin or other products, which can cause increased bleeding during surgery and should not be taken for 2 weeks prior to and 1 week after surgery.  If you should need take something for relief of minor pain, you may take acetaminophen which is found in Tylenol,m Datril, Anacin-3 and Panadol. It is not blood thinner. The products listed below are.  Do not take any of the products listed below in addition to any listed on your instruction sheet.  A.P.C or A.P.C with Codeine Codeine Phosphate Capsules #3 Ibuprofen Ridaura  ABC compound Congesprin Imuran rimadil  Advil Cope Indocin Robaxisal  Alka-Seltzer Effervescent Pain Reliever and Antacid Coricidin or Coricidin-D  Indomethacin Rufen  Alka-Seltzer plus Cold Medicine Cosprin Ketoprofen S-A-C Tablets  Anacin Analgesic Tablets or Capsules Coumadin Korlgesic Salflex  Anacin Extra Strength Analgesic tablets or capsules CP-2 Tablets Lanoril Salicylate  Anaprox Cuprimine Capsules Levenox Salocol  Anexsia-D Dalteparin Magan Salsalate  Anodynos Darvon compound Magnesium Salicylate Sine-off  Ansaid Dasin Capsules Magsal Sodium Salicylate  Anturane Depen Capsules Marnal Soma  APF Arthritis pain formula Dewitt's Pills  Measurin Stanback  Argesic Dia-Gesic Meclofenamic Sulfinpyrazone  Arthritis Bayer Timed Release Aspirin Diclofenac Meclomen Sulindac  Arthritis pain formula Anacin Dicumarol Medipren Supac  Analgesic (Safety coated) Arthralgen Diffunasal Mefanamic Suprofen  Arthritis Strength Bufferin Dihydrocodeine Mepro Compound Suprol  Arthropan liquid Dopirydamole Methcarbomol with Aspirin Synalgos  ASA tablets/Enseals Disalcid Micrainin Tagament  Ascriptin Doan's Midol Talwin  Ascriptin A/D Dolene Mobidin Tanderil  Ascriptin Extra Strength Dolobid Moblgesic Ticlid  Ascriptin with Codeine Doloprin or Doloprin with Codeine Momentum Tolectin  Asperbuf Duoprin  Mono-gesic Trendar  Aspergum Duradyne Motrin or Motrin IB Triminicin  Aspirin plain, buffered or enteric coated Durasal Myochrisine Trigesic  Aspirin Suppositories Easprin Nalfon Trillsate  Aspirin with Codeine Ecotrin Regular or Extra Strength Naprosyn Uracel  Atromid-S Efficin Naproxen Ursinus  Auranofin Capsules Elmiron Neocylate Vanquish  Axotal Emagrin Norgesic Verin  Azathioprine Empirin or Empirin with Codeine Normiflo Vitamin E  Azolid Emprazil Nuprin Voltaren  Bayer Aspirin plain, buffered or children's or timed BC Tablets or powders Encaprin Orgaran Warfarin Sodium  Buff-a-Comp Enoxaparin Orudis Zorpin  Buff-a-Comp with Codeine Equegesic Os-Cal-Gesic   Buffaprin Excedrin plain, buffered or Extra Strength Oxalid   Bufferin Arthritis Strength Feldene Oxphenbutazone   Bufferin plain or Extra Strength Feldene Capsules Oxycodone with Aspirin   Bufferin with Codeine Fenoprofen Fenoprofen Pabalate or Pabalate-SF   Buffets II Flogesic Panagesic   Buffinol plain or Extra Strength Florinal or Florinal with Codeine Panwarfarin   Buf-Tabs Flurbiprofen Penicillamine   Butalbital Compound Four-way cold tablets Penicillin   Butazolidin Fragmin Pepto-Bismol   Carbenicillin Geminisyn Percodan   Carna Arthritis Reliever Geopen Persantine    Carprofen Gold's salt Persistin   Chloramphenicol Goody's Phenylbutazone   Chloromycetin Haltrain Piroxlcam   Clmetidine heparin Plaquenil   Cllnoril Hyco-pap Ponstel   Clofibrate Hydroxy chloroquine Propoxyphen         Before stopping any of these medications, be sure to consult the physician who ordered them.  Some, such as Coumadin (Warfarin) are ordered to prevent or treat serious conditions such as "deep thrombosis", "pumonary embolisms", and other heart problems.  The amount of time that you may need off of the medication may also vary with the medication and the reason for which you were taking it.  If you are taking any of these medications, please make sure you notify your pain physician before you undergo any procedures.          Facet Joint Block The facet joints connect the bones of the spine (vertebrae). They make it possible for you to bend, twist, and make other movements with your spine. They also keep you from bending too far, twisting too far, and making other excessive movements. A facet joint block is a procedure where a numbing medicine (anesthetic) is injected into a facet joint. Often, a type of anti-inflammatory medicine called a steroid is also injected. A facet joint block may be done to diagnose neck or back pain. If the pain gets better after a facet joint block, it means the pain is probably coming from the facet joint. If the pain does not get better, it means the pain is probably not coming from the facet joint. A facet joint block may also be done to relieve neck or back pain caused by an inflamed facet joint. A facet joint block is only done to relieve pain if the pain does not improve with other methods, such as medicine, exercise programs, and physical therapy. Tell a health care provider about: Any allergies you have. All medicines you are taking, including vitamins, herbs, eye drops, creams, and over-the-counter medicines. Any problems you or family  members have had with anesthetic medicines. Any blood disorders you have. Any surgeries you have had. Any medical conditions you have. Whether you are pregnant or may be pregnant. What are the risks? Generally, this is a safe procedure. However, problems may occur, including: Bleeding. Injury to a nerve near the injection site. Pain at the injection site. Weakness or numbness in areas controlled by nerves near the injection site. Infection. Temporary  fluid retention. Allergic reactions to medicines or dyes. Injury to other structures or organs near the injection site. What happens before the procedure? Follow instructions from your health care provider about eating or drinking restrictions. Ask your health care provider about: Changing or stopping your regular medicines. This is especially important if you are taking diabetes medicines or blood thinners. Taking medicines such as aspirin and ibuprofen. These medicines can thin your blood. Do not take these medicines before your procedure if your health care provider instructs you not to. Do not take any new dietary supplements or medicines without asking your health care provider first. Plan to have someone take you home after the procedure. What happens during the procedure? You may need to remove your clothing and dress in an open-back gown. The procedure will be done while you are lying on an X-ray table. You will most likely be asked to lie on your stomach, but you may be asked to lie in a different position if an injection will be made in your neck. Machines will be used to monitor your oxygen levels, heart rate, and blood pressure. If an injection will be made in your neck, an IV tube will be inserted into one of your veins. Fluids and medicine will flow directly into your body through the IV tube. The area over the facet joint where the injection will be made will be cleaned with soap. The surrounding skin will be covered with clean  drapes. A numbing medicine (local anesthetic) will be applied to your skin. Your skin may sting or burn for a moment. A video X-ray machine (fluoroscopy) will be used to locate the joint. In some cases, a CT scan may be used. A contrast dye may be injected into the facet joint area to help locate the joint. When the joint is located, an anesthetic will be injected into the joint through the needle. Your health care provider will ask you whether you feel pain relief. If you do feel relief, a steroid may be injected to provide pain relief for a longer period of time. If you do not feel relief or feel only partial relief, additional injections of an anesthetic may be made in other facet joints. The needle will be removed. Your skin will be cleaned. A bandage (dressing) will be applied over each injection site. The procedure may vary among health care providers and hospitals. What happens after the procedure? You will be observed for 15-30 minutes before being allowed to go home. This information is not intended to replace advice given to you by your health care provider. Make sure you discuss any questions you have with your health care provider. Document Released: 04/11/2007 Document Revised: 12/22/2015 Document Reviewed: 08/16/2015 Elsevier Interactive Patient Education  2017 [employer].

## 2017-04-26 NOTE — Progress Notes (Signed)
Safety precautions to be maintained throughout the outpatient stay will include: orient to surroundings, keep bed in low position, maintain call bell within reach at all times, provide assistance with transfer out of bed and ambulation.  

## 2017-04-28 LAB — PROLONGED APTT
Dilute Viper Venom Time: 38.7 s (ref 0.0–47.0)
Factor IX Activity: 160 % (ref 60–177)
Factor VIII Activity: 146 % (ref 57–163)
Factor XI Activity: 127 % (ref 60–150)
Factor XII Activity: 106 % (ref 50–150)
PTT Lupus Anticoagulant: 34.7 s (ref 0.0–51.9)

## 2017-04-28 LAB — PROTIME-INR
INR: 0.9 (ref 0.8–1.2)
Prothrombin Time: 9.9 s (ref 9.1–12.0)

## 2017-04-28 LAB — PLATELET COUNT: Platelets: 303 10*3/uL (ref 150–379)

## 2017-05-01 ENCOUNTER — Encounter: Payer: Self-pay | Admitting: Family Medicine

## 2017-05-01 ENCOUNTER — Ambulatory Visit (INDEPENDENT_AMBULATORY_CARE_PROVIDER_SITE_OTHER): Payer: Medicare Other | Admitting: Family Medicine

## 2017-05-01 VITALS — BP 110/70 | HR 66 | Temp 98.1°F | Resp 16 | Ht 68.0 in | Wt 274.0 lb

## 2017-05-01 DIAGNOSIS — I6523 Occlusion and stenosis of bilateral carotid arteries: Secondary | ICD-10-CM

## 2017-05-01 DIAGNOSIS — F17210 Nicotine dependence, cigarettes, uncomplicated: Secondary | ICD-10-CM

## 2017-05-01 DIAGNOSIS — J449 Chronic obstructive pulmonary disease, unspecified: Secondary | ICD-10-CM

## 2017-05-01 DIAGNOSIS — E119 Type 2 diabetes mellitus without complications: Secondary | ICD-10-CM

## 2017-05-01 LAB — POCT GLYCOSYLATED HEMOGLOBIN (HGB A1C)
Est. average glucose Bld gHb Est-mCnc: 140
Hemoglobin A1C: 6.5

## 2017-05-01 MED ORDER — BUDESONIDE-FORMOTEROL FUMARATE 160-4.5 MCG/ACT IN AERO: 2.0000 | INHALATION_SPRAY | Freq: Two times a day (BID) | RESPIRATORY_TRACT | 11 refills | Status: DC

## 2017-05-01 NOTE — Patient Instructions (Signed)
   Please stop smoking. You can call your pharmacy for refill of bupropion (Wellbutrin) to help stop smoking   Screening for lung cancer is recommended for people between 16 and 60 years of age who have smoked the equivalent of 1 pack per day for 30 years. Please call our office at [phone] to schedule a low dose CT lung scan for lung cancer screening.

## 2017-05-01 NOTE — Progress Notes (Signed)
Patient: Laura Nielsen Female    DOB: 04/15/1956   61 y.o.   MRN: [id number] Visit Date: 05/01/2017  Today's Provider: Lelon Huh, MD   Chief Complaint  Patient presents with  . Follow-up  . Diabetes  . Hypertension   Subjective:    HPI   Diabetes Mellitus Type II, Follow-up:   Lab Results  Component Value Date   HGBA1C 6.1 01/03/2017   HGBA1C 6.3 09/25/2016   HGBA1C 6.1 05/18/2016   Last seen for diabetes 4 months ago.  Management since then includes; no changes. She reports good compliance with treatment. She is not having side effects. none Current symptoms include none and have been unchanged. Home blood sugar records: fasting range: not checking  Episodes of hypoglycemia? no   Current Insulin Regimen: n/a Most Recent Eye Exam: due Weight trend: stable Prior visit with dietician: no Current diet: in general, an "unhealthy" diet Current exercise: none  ----------------------------------------------------------------    Hypertension, follow-up:  BP Readings from Last 3 Encounters:  05/01/17 110/70  04/26/17 (!) 159/83  04/11/17 122/78    She was last seen for hypertension 4 months ago.  BP at that visit was 130/80. Management since that visit includes; no changes.She reports good compliance with treatment. She is not having side effects. none She is not exercising. She is not adherent to low salt diet.   Outside blood pressures are not checking. She is experiencing none.  Patient denies none.   Cardiovascular risk factors include diabetes mellitus.  Use of agents associated with hypertension: none.   ----------------------------------------------------------------  Follow COPD She states she is needing to use her rescue inhaler 4 times every day due to coughing and wheezing. She has cut back on smoking to about 1/2 ppd. She was prescribed bupropion in October and feels liked it helped with smoking cravings for a few weeks, but she was  never able to stop sompletely. She finished medication about a week ago and does not want to start back on it because she didn't think it made that much difference.   Allergies  Allergen Reactions  . Atorvastatin Other (See Comments)    Elevated blood sugar Elevated blood sugar  . Omeprazole Nausea And Vomiting     Current Outpatient Prescriptions:  .  albuterol (PROVENTIL HFA) 108 (90 Base) MCG/ACT inhaler, Inhale 2 puffs into the lungs every 6 (six) hours as needed for wheezing or shortness of breath., Disp: 18 g, Rfl: 11 .  allopurinol (ZYLOPRIM) 100 MG tablet, Take 1 tablet (100 mg total) by mouth daily., Disp: 30 tablet, Rfl: 5 .  amitriptyline (ELAVIL) 25 MG tablet, Take 1 tablet (25 mg total) by mouth at bedtime., Disp: 30 tablet, Rfl: 0 .  aspirin 81 MG tablet, Take 81 mg by mouth daily. , Disp: , Rfl:  .  bisoprolol (ZEBETA) 10 MG tablet, Take 1 tablet (10 mg total) by mouth daily., Disp: 30 tablet, Rfl: 6 .  Cholecalciferol (VITAMIN D3) 2000 units capsule, Take 1 capsule (2,000 Units total) by mouth daily., Disp: 30 capsule, Rfl: PRN .  clopidogrel (PLAVIX) 75 MG tablet, Take 1 tablet (75 mg total) by mouth daily., Disp: 90 tablet, Rfl: 3 .  Cyanocobalamin (RA VITAMIN B-12 TR) 1000 MCG TBCR, Take 2 tablets by mouth daily. , Disp: , Rfl:  .  gabapentin (NEURONTIN) 300 MG capsule, Take 1 capsule (300 mg total) by mouth 3 (three) times daily., Disp: 90 capsule, Rfl: 0 .  Ibuprofen 200 MG CAPS,  Take by mouth. , Disp: , Rfl:  .  lisinopril (PRINIVIL,ZESTRIL) 20 MG tablet, Take 1 tablet (20 mg total) by mouth daily., Disp: 90 tablet, Rfl: 3 .  loratadine (CLARITIN) 10 MG tablet, Take 10 mg by mouth daily as needed. , Disp: , Rfl:  .  lovastatin (MEVACOR) 40 MG tablet, Take 1 and 1/2 Tablets Daily., Disp: 45 tablet, Rfl: 5 .  oxyCODONE (ROXICODONE) 5 MG immediate release tablet, Take 1 tablet (5 mg total) by mouth every 6 (six) hours as needed for severe pain., Disp: 15 tablet, Rfl: 0 .   vitamin C (ASCORBIC ACID) 500 MG tablet, Take 500 mg by mouth. , Disp: , Rfl:  .  Vitamin D, Ergocalciferol, (DRISDOL) 50000 units CAPS capsule, Take 1 capsule (50,000 Units total) by mouth 2 (two) times a week x 6 weeks., Disp: 12 capsule, Rfl: 0 .  buPROPion (WELLBUTRIN SR) 150 MG 12 hr tablet, 1 tablet daily for 3 days, then 1 tablet twice daily. Stop smoking 14 days after starting medication, Disp: 60 tablet, Rfl: 6  Review of Systems  Constitutional: Negative for appetite change, chills, fatigue and fever.  HENT: Positive for congestion.   Respiratory: Positive for cough. Negative for chest tightness and shortness of breath.   Cardiovascular: Negative for chest pain and palpitations.  Gastrointestinal: Negative for abdominal pain, nausea and vomiting.  Neurological: Negative for dizziness and weakness.    Social History  Substance Use Topics  . Smoking status: Current Every Day Smoker    Packs/day: 1.00    Types: Cigarettes  . Smokeless tobacco: Never Used     Comment: previously smoked over 2 ppd  . Alcohol use Yes     Comment: Rarely   Objective:   BP 110/70 (BP Location: Right Arm, Patient Position: Sitting, Cuff Size: Large)   Pulse 66   Temp 98.1 F (36.7 C) (Oral)   Resp 16   Ht 5\' 8"  (1.727 m)   Wt 274 lb (124.3 kg)   SpO2 94%   BMI 41.66 kg/m     Physical Exam  General Appearance:    Alert, cooperative, no distress, obese  Eyes:    PERRL, conjunctiva/corneas clear, EOM's intact       Lungs:     Occasional expiratory wheeze, no rales,  respirations unlabored  Heart:    Regular rate and rhythm  Neurologic:   Awake, alert, oriented x 3. No apparent focal neurological           defect.          Results for orders placed or performed in visit on 05/01/17  POCT glycosylated hemoglobin (Hb A1C)  Result Value Ref Range   Hemoglobin A1C 6.5    Est. average glucose Bld gHb Est-mCnc 140        Assessment & Plan:     1. Type 2 diabetes mellitus without  complication, without long-term current use of insulin (HCC) Diet controlled.  - POCT glycosylated hemoglobin (Hb A1C)  2. CAFL (chronic airflow limitation) (HCC) Start maintenance inhaler.  - budesonide-formoterol (SYMBICORT) 160-4.5 MCG/ACT inhaler; Inhale 2 puffs into the lungs 2 (two) times daily.  Dispense: 1 Inhaler; Refill: 11  3. Smoking greater than 30 pack years Encourage to quit. Advised to call for refill bupropion if she changes her mind and wants to start back on it.   .Return in about 4 months (around 09/01/2017).        The entirety of the information documented in the History of  Present Illness, Review of Systems and Physical Exam were personally obtained by me. Portions of this information were initially documented by Theressa Millard, CMA and reviewed by me for thoroughness and accuracy.   Lelon Huh, MD  [facility]

## 2017-05-17 ENCOUNTER — Telehealth: Payer: Self-pay | Admitting: Pain Medicine

## 2017-05-17 NOTE — Telephone Encounter (Signed)
We have permission to stop Plavix 7 days. Patient notified. Need to check for PA needed. Bilateral lumbar facet.

## 2017-05-17 NOTE — Telephone Encounter (Signed)
Have we received permission back from cardiologist to stop plavix for a procedure? If so she wants to wait until after July 4th to have procedure

## 2017-05-23 ENCOUNTER — Other Ambulatory Visit: Payer: Self-pay | Admitting: Family Medicine

## 2017-06-11 ENCOUNTER — Ambulatory Visit (HOSPITAL_BASED_OUTPATIENT_CLINIC_OR_DEPARTMENT_OTHER): Payer: Medicare Other | Admitting: Pain Medicine

## 2017-06-11 ENCOUNTER — Ambulatory Visit
Admission: RE | Admit: 2017-06-11 | Discharge: 2017-06-11 | Disposition: A | Discharge status: Home / Self Care | Payer: Medicare Other | Source: Ambulatory Visit | Attending: Pain Medicine | Admitting: Pain Medicine

## 2017-06-11 ENCOUNTER — Encounter: Payer: Self-pay | Admitting: Pain Medicine

## 2017-06-11 VITALS — BP 142/68 | HR 61 | Temp 97.8°F | Resp 13 | Ht 68.0 in | Wt 268.0 lb

## 2017-06-11 DIAGNOSIS — M542 Cervicalgia: Secondary | ICD-10-CM | POA: Diagnosis not present

## 2017-06-11 DIAGNOSIS — M4696 Unspecified inflammatory spondylopathy, lumbar region: Secondary | ICD-10-CM | POA: Diagnosis not present

## 2017-06-11 DIAGNOSIS — M47896 Other spondylosis, lumbar region: Secondary | ICD-10-CM | POA: Diagnosis not present

## 2017-06-11 DIAGNOSIS — M47816 Spondylosis without myelopathy or radiculopathy, lumbar region: Secondary | ICD-10-CM | POA: Diagnosis not present

## 2017-06-11 DIAGNOSIS — Z7982 Long term (current) use of aspirin: Secondary | ICD-10-CM | POA: Diagnosis not present

## 2017-06-11 DIAGNOSIS — M549 Dorsalgia, unspecified: Secondary | ICD-10-CM | POA: Insufficient documentation

## 2017-06-11 DIAGNOSIS — Z79899 Other long term (current) drug therapy: Secondary | ICD-10-CM | POA: Insufficient documentation

## 2017-06-11 DIAGNOSIS — Z7902 Long term (current) use of antithrombotics/antiplatelets: Secondary | ICD-10-CM | POA: Diagnosis not present

## 2017-06-11 MED ORDER — LIDOCAINE HCL (PF) 1.5 % IJ SOLN
20.0000 mL | Freq: Once | INTRAMUSCULAR | Status: DC
  Filled 2017-06-11: qty 20

## 2017-06-11 MED ORDER — TRIAMCINOLONE ACETONIDE 40 MG/ML IJ SUSP
40.0000 mg | Freq: Once | INTRAMUSCULAR | Status: AC
  Administered 2017-06-11: 40 mg

## 2017-06-11 MED ORDER — ROPIVACAINE HCL 2 MG/ML IJ SOLN
INTRAMUSCULAR | Status: AC
  Filled 2017-06-11: qty 20

## 2017-06-11 MED ORDER — LACTATED RINGERS IV SOLN
1000.0000 mL | Freq: Once | INTRAVENOUS | Status: AC
  Administered 2017-06-11: 1000 mL via INTRAVENOUS

## 2017-06-11 MED ORDER — FENTANYL CITRATE (PF) 100 MCG/2ML IJ SOLN
25.0000 ug | INTRAMUSCULAR | Status: DC | PRN
  Administered 2017-06-11: 100 ug via INTRAVENOUS

## 2017-06-11 MED ORDER — ROPIVACAINE HCL 2 MG/ML IJ SOLN
9.0000 mL | Freq: Once | INTRAMUSCULAR | Status: AC
  Administered 2017-06-11: 10 mL via PERINEURAL

## 2017-06-11 MED ORDER — MIDAZOLAM HCL 5 MG/5ML IJ SOLN
1.0000 mg | INTRAMUSCULAR | Status: DC | PRN
  Administered 2017-06-11: 3 mg via INTRAVENOUS

## 2017-06-11 MED ORDER — MIDAZOLAM HCL 5 MG/5ML IJ SOLN
INTRAMUSCULAR | Status: AC
  Filled 2017-06-11: qty 5

## 2017-06-11 MED ORDER — FENTANYL CITRATE (PF) 100 MCG/2ML IJ SOLN
INTRAMUSCULAR | Status: AC
  Filled 2017-06-11: qty 2

## 2017-06-11 MED ORDER — TRIAMCINOLONE ACETONIDE 40 MG/ML IJ SUSP
INTRAMUSCULAR | Status: AC
  Filled 2017-06-11: qty 2

## 2017-06-11 NOTE — Patient Instructions (Signed)

## 2017-06-11 NOTE — Progress Notes (Signed)
Patient's Name: Laura Nielsen  MRN: [id number]  Referring Provider: Birdie Sons, MD  DOB: 1956-07-05  PCP: Birdie Sons, MD  DOS: 06/11/2017  Note by: Kathlen Brunswick. Dossie Arbour, MD  Service setting: Ambulatory outpatient  Specialty: Interventional Pain Management  Patient type: Established  Location: [facility] (AMB) Pain Management Facility  Visit type: Interventional Procedure   Primary Reason for Visit: Interventional Pain Management Treatment. CC: Back Pain (The pain is in the low back and also she has neck pain.)  Procedure:  Anesthesia, Analgesia, Anxiolysis:  Type: Diagnostic Medial Branch Facet Block Region: Lumbar Level: L2, L3, L4, L5, & S1 Medial Branch Level(s) Laterality: Bilateral  Type: Local Anesthesia with Moderate (Conscious) Sedation Local Anesthetic: Lidocaine 1% Route: Intravenous (IV) IV Access: Secured Sedation: Meaningful verbal contact was maintained at all times during the procedure  Indication(s): Analgesia and Anxiety  Indications: 1. Lumbar facet syndrome (Bilateral) (L>R)   2. Lumbar facet hypertrophy (multilevel) (Bilateral)   3. Lumbar spondylosis    Pain Score: Pre-procedure: 2 /10 Post-procedure: 0-No pain/10  Pre-op Assessment:  Previous date of service:   Service provided:   Laura Nielsen is a 61 y.o. (year old), female patient, seen today for interventional treatment. She  has a past surgical history that includes vascular stent (03/28/2011); Cardiac catheterization (3/210); Kidney stone surgery (1999); Vaginal hysterectomy; and Tubal ligation. Laura Nielsen has a current medication list which includes the following prescription(s): albuterol, allopurinol, aspirin, bisoprolol, budesonide-formoterol, vitamin d3, clopidogrel, cyanocobalamin, ibuprofen, lisinopril, lovastatin, vitamin c, amitriptyline, bupropion, gabapentin, loratadine, oxycodone, and vitamin d (ergocalciferol), and the following Facility-Administered Medications: fentanyl, lidocaine, and midazolam.  Her primarily concern today is the Back Pain (The pain is in the low back and also she has neck pain.)  Initial Vital Signs: There were no vitals taken for this visit. BMI: 40.75 kg/m  Risk Assessment: Allergies: Reviewed. She is allergic to atorvastatin and omeprazole.  Allergy Precautions: None required Coagulopathies: Reviewed. None identified.  Blood-thinner therapy: None at this time Active Infection(s): Reviewed. None identified. Laura Nielsen is afebrile  Site Confirmation: Laura Nielsen was asked to confirm the procedure and laterality before marking the site Procedure checklist: Completed Consent: Before the procedure and under the influence of no sedative(s), amnesic(s), or anxiolytics, the patient was informed of the treatment options, risks and possible complications. To fulfill our ethical and legal obligations, as recommended by the American Medical Association's Code of Ethics, I have informed the patient of my clinical impression; the nature and purpose of the treatment or procedure; the risks, benefits, and possible complications of the intervention; the alternatives, including doing nothing; the risk(s) and benefit(s) of the alternative treatment(s) or procedure(s); and the risk(s) and benefit(s) of doing nothing. The patient was provided information about the general risks and possible complications associated with the procedure. These may include, but are not limited to: failure to achieve desired goals, infection, bleeding, organ or nerve damage, allergic reactions, paralysis, and death. In addition, the patient was informed of those risks and complications associated to Spine-related procedures, such as failure to decrease pain; infection (i.e.: Meningitis, epidural or intraspinal abscess); bleeding (i.e.: epidural hematoma, subarachnoid hemorrhage, or any other type of intraspinal or peri-dural bleeding); organ or nerve damage (i.e.: Any type of peripheral nerve, nerve root, or spinal  cord injury) with subsequent damage to sensory, motor, and/or autonomic systems, resulting in permanent pain, numbness, and/or weakness of one or several areas of the body; allergic reactions; (i.e.: anaphylactic reaction); and/or death. Furthermore, the patient was informed of  those risks and complications associated with the medications. These include, but are not limited to: allergic reactions (i.e.: anaphylactic or anaphylactoid reaction(s)); adrenal axis suppression; blood sugar elevation that in diabetics may result in ketoacidosis or comma; water retention that in patients with history of congestive heart failure may result in shortness of breath, pulmonary edema, and decompensation with resultant heart failure; weight gain; swelling or edema; medication-induced neural toxicity; particulate matter embolism and blood vessel occlusion with resultant organ, and/or nervous system infarction; and/or aseptic necrosis of one or more joints. Finally, the patient was informed that Medicine is not an exact science; therefore, there is also the possibility of unforeseen or unpredictable risks and/or possible complications that may result in a catastrophic outcome. The patient indicated having understood very clearly. We have given the patient no guarantees and we have made no promises. Enough time was given to the patient to ask questions, all of which were answered to the patient's satisfaction. Laura Nielsen has indicated that she wanted to continue with the procedure. Attestation: I, the ordering provider, attest that I have discussed with the patient the benefits, risks, side-effects, alternatives, likelihood of achieving goals, and potential problems during recovery for the procedure that I have provided informed consent. Date: 06/11/2017; Time: 8:02 AM  Pre-Procedure Preparation:  Monitoring: As per clinic protocol. Respiration, ETCO2, SpO2, BP, heart rate and rhythm monitor placed and checked for adequate  function Safety Precautions: Patient was assessed for positional comfort and pressure points before starting the procedure. Time-out: I initiated and conducted the "Time-out" before starting the procedure, as per protocol. The patient was asked to participate by confirming the accuracy of the "Time Out" information. Verification of the correct person, site, and procedure were performed and confirmed by me, the nursing staff, and the patient. "Time-out" conducted as per Joint Commission's Universal Protocol (UP.01.01.01). "Time-out" Date & Time: 06/11/2017; 0909 hrs.  Description of Procedure Process:   Position: Prone Target Area: For Lumbar Facet blocks, the target is the groove formed by the junction of the transverse process and superior articular process. For the L5 dorsal ramus, the target is the notch between superior articular process and sacral ala. For the S1 dorsal ramus, the target is the superior and lateral edge of the posterior S1 Sacral foramen. Approach: Paramedial approach. Area Prepped: Entire Posterior Lumbosacral Region Prepping solution: ChloraPrep (2% chlorhexidine gluconate and 70% isopropyl alcohol) Safety Precautions: Aspiration looking for blood return was conducted prior to all injections. At no point did we inject any substances, as a needle was being advanced. No attempts were made at seeking any paresthesias. Safe injection practices and needle disposal techniques used. Medications properly checked for expiration dates. SDV (single dose vial) medications used. Description of the Procedure: Protocol guidelines were followed. The patient was placed in position over the fluoroscopy table. The target area was identified and the area prepped in the usual manner. Skin desensitized using vapocoolant spray. Skin & deeper tissues infiltrated with local anesthetic. Appropriate amount of time allowed to pass for local anesthetics to take effect. The procedure needle was introduced  through the skin, ipsilateral to the reported pain, and advanced to the target area. Employing the "Medial Branch Technique", the needles were advanced to the angle made by the superior and medial portion of the transverse process, and the lateral and inferior portion of the superior articulating process of the targeted vertebral bodies. This area is known as "Burton's Eye" or the "Eye of the [place] Dog". A procedure needle was introduced through  the skin, and this time advanced to the angle made by the superior and medial border of the sacral ala, and the lateral border of the S1 vertebral body. This last needle was later repositioned at the superior and lateral border of the posterior S1 foramen. Negative aspiration confirmed. Solution injected in intermittent fashion, asking for systemic symptoms every 0.5cc of injectate. The needles were then removed and the area cleansed, making sure to leave some of the prepping solution back to take advantage of its long term bactericidal properties.   Illustration of the posterior view of the lumbar spine and the posterior neural structures. Laminae of L2 through S1 are labeled. DPRL5, dorsal primary ramus of L5; DPRS1, dorsal primary ramus of S1; DPR3, dorsal primary ramus of L3; FJ, facet (zygapophyseal) joint L3-L4; I, inferior articular process of L4; LB1, lateral branch of dorsal primary ramus of L1; IAB, inferior articular branches from L3 medial branch (supplies L4-L5 facet joint); IBP, intermediate branch plexus; MB3, medial branch of dorsal primary ramus of L3; NR3, third lumbar nerve root; S, superior articular process of L5; SAB, superior articular branches from L4 (supplies L4-5 facet joint also); TP3, transverse process of L3.  Vitals:   06/11/17 0924 06/11/17 0933 06/11/17 0943 06/11/17 0952  BP: 135/85 (!) 151/69 140/70 (!) 142/68  Pulse: 64 67 (!) 59 61  Resp: 14 16 18 13   Temp:      TempSrc:      SpO2: 93% 97% 98% 99%  Weight:      Height:         Start Time: 0910 hrs. End Time: 0924 hrs. Materials:  Needle(s) Type: Regular needle Gauge: 22G Length: 7-in Medication(s): We administered lactated ringers, midazolam, fentaNYL, triamcinolone acetonide, ropivacaine (PF) 2 mg/mL (0.2%), triamcinolone acetonide, and ropivacaine (PF) 2 mg/mL (0.2%). Please see chart orders for dosing details.  Imaging Guidance (Spinal):  Type of Imaging Technique: Fluoroscopy Guidance (Spinal) Indication(s): Assistance in needle guidance and placement for procedures requiring needle placement in or near specific anatomical locations not easily accessible without such assistance. Exposure Time: Please see nurses notes. Contrast: None used. Fluoroscopic Guidance: I was personally present during the use of fluoroscopy. "Tunnel Vision Technique" used to obtain the best possible view of the target area. Parallax error corrected before commencing the procedure. "Direction-depth-direction" technique used to introduce the needle under continuous pulsed fluoroscopy. Once target was reached, antero-posterior, oblique, and lateral fluoroscopic projection used confirm needle placement in all planes. Images permanently stored in EMR. Interpretation: No contrast injected. I personally interpreted the imaging intraoperatively. Adequate needle placement confirmed in multiple planes. Permanent images saved into the patient's record.  Antibiotic Prophylaxis:  Indication(s): None identified Antibiotic given: None  Post-operative Assessment:  EBL: None Complications: No immediate post-treatment complications observed by team, or reported by patient. Note: The patient tolerated the entire procedure well. A repeat set of vitals were taken after the procedure and the patient was kept under observation following institutional policy, for this type of procedure. Post-procedural neurological assessment was performed, showing return to baseline, prior to discharge. The patient was  provided with post-procedure discharge instructions, including a section on how to identify potential problems. Should any problems arise concerning this procedure, the patient was given instructions to immediately contact us, at any time, without hesitation. In any case, we plan to contact the patient by telephone for a follow-up status report regarding this interventional procedure. Comments:  No additional relevant information.  Plan of Care  Disposition: Discharge home  Discharge Date &  Time: 06/11/2017; 0955 hrs.  Physician-requested Follow-up:  Return for post-procedure eval (in 2 wks).  Future Appointments Date Time Provider [place]  07/05/2017 8:00 AM Milinda Pointer, MD [facility] None  08/30/2017 8:00 AM Birdie Sons, MD [facility] None  02/25/2018 8:30 AM [facility] None  02/25/2018 9:00 AM [facility] None  02/25/2018 10:00 AM Schnier, Dolores Lory, MD [facility] None   Medications ordered for procedure: Meds ordered this encounter  Medications  . lactated ringers infusion 1,000 mL  . midazolam (VERSED) 5 MG/5ML injection 1-2 mg    Make sure Flumazenil is available in the pyxis when using this medication. If oversedation occurs, administer 0.2 mg IV over 15 sec. If after 45 sec no response, administer 0.2 mg again over 1 min; may repeat at 1 min intervals; not to exceed 4 doses (1 mg)  . fentaNYL (SUBLIMAZE) injection 25-50 mcg    Make sure Narcan is available in the pyxis when using this medication. In the event of respiratory depression (RR< 8/min): Titrate NARCAN (naloxone) in increments of 0.1 to 0.2 mg IV at 2-3 minute intervals, until desired degree of reversal.  . lidocaine 1.5 % injection 20 mL    From block tray  . triamcinolone acetonide (KENALOG-40) injection 40 mg  . ropivacaine (PF) 2 mg/mL (0.2%) (NAROPIN) injection 9 mL  . triamcinolone acetonide (KENALOG-40) injection 40 mg  . ropivacaine (PF) 2 mg/mL (0.2%) (NAROPIN) injection 9 mL    Medications administered: We administered lactated ringers, midazolam, fentaNYL, triamcinolone acetonide, ropivacaine (PF) 2 mg/mL (0.2%), triamcinolone acetonide, and ropivacaine (PF) 2 mg/mL (0.2%).  See the medical record for exact dosing, route, and time of administration.  Lab-work, Procedure(s), & Referral(s) Ordered: Orders Placed This Encounter  Procedures  . LUMBAR FACET(MEDIAL BRANCH NERVE BLOCK) MBNB  . DG C-Arm 1-60 Min-No Report  . Informed Consent Details: Transcribe to consent form and obtain patient signature  . Provider attestation of informed consent for procedure/surgical case  . Verify informed consent  . Discharge instructions  . Follow-up   Imaging Ordered: New Prescriptions   No medications on file   Primary Care Physician: Birdie Sons, MD Location: [facility] Note by: Kathlen Brunswick. Dossie Arbour, M.D, DABA, DABAPM, DABPM, DABIPP, FIPP Date: 06/11/2017; Time: 1:57 PM  Disclaimer:  Medicine is not an Chief Strategy Officer. The only guarantee in medicine is that nothing is guaranteed. It is important to note that the decision to proceed with this intervention was based on the information collected from the patient. The Data and conclusions were drawn from the patient's questionnaire, the interview, and the physical examination. Because the information was provided in large part by the patient, it cannot be guaranteed that it has not been purposely or unconsciously manipulated. Every effort has been made to obtain as much relevant data as possible for this evaluation. It is important to note that the conclusions that lead to this procedure are derived in large part from the available data. Always take into account that the treatment will also be dependent on availability of resources and existing treatment guidelines, considered by other Pain Management Practitioners as being common knowledge and practice, at the time of the intervention. For  Medico-Legal purposes, it is also important to point out that variation in procedural techniques and pharmacological choices are the acceptable norm. The indications, contraindications, technique, and results of the above procedure should only be interpreted and judged by a Board-Certified Interventional Pain Specialist with extensive familiarity and  expertise in the same exact procedure and technique.  Instructions provided at this appointment: Patient Instructions  ____________________________________________________________________________________________  Post-Procedure instructions Instructions:  Apply ice: Fill a plastic sandwich bag with crushed ice. Cover it with a small towel and apply to injection site. Apply for 15 minutes then remove x 15 minutes. Repeat sequence on day of procedure, until you go to bed. The purpose is to minimize swelling and discomfort after procedure.  Apply heat: Apply heat to procedure site starting the day following the procedure. The purpose is to treat any soreness and discomfort from the procedure.  Food intake: Start with clear liquids (like water) and advance to regular food, as tolerated.   Physical activities: Keep activities to a minimum for the first 8 hours after the procedure.   Driving: If you have received any sedation, you are not allowed to drive for 24 hours after your procedure.  Blood thinner: Restart your blood thinner 6 hours after your procedure. (Only for those taking blood thinners)  Insulin: As soon as you can eat, you may resume your normal dosing schedule. (Only for those taking insulin)  Infection prevention: Keep procedure site clean and dry.  Post-procedure Pain Diary: Extremely important that this be done correctly and accurately. Recorded information will be used to determine the next step in treatment.  Pain evaluated is that of treated area only. Do not include pain from an untreated area.  Complete every hour, on the  hour, for the initial 8 hours. Set an alarm to help you do this part accurately.  Do not go to sleep and have it completed later. It will not be accurate.  Follow-up appointment: Keep your follow-up appointment after the procedure. Usually 2 weeks for most procedures. (6 weeks in the case of radiofrequency.) Bring you pain diary.  Expect:  From numbing medicine (AKA: Local Anesthetics): Numbness or decrease in pain.  Onset: Full effect within 15 minutes of injected.  Duration: It will depend on the type of local anesthetic used. On the average, 1 to 8 hours.   From steroids: Decrease in swelling or inflammation. Once inflammation is improved, relief of the pain will follow.  Onset of benefits: Depends on the amount of swelling present. The more swelling, the longer it will take for the benefits to be seen. In some cases, up to 10 days.  Duration: Steroids will stay in the system x 2 weeks. Duration of benefits will depend on multiple posibilities including persistent irritating factors.  From procedure: Some discomfort is to be expected once the numbing medicine wears off. This should be minimal if ice and heat are applied as instructed. Call if:  You experience numbness and weakness that gets worse with time, as opposed to wearing off.  New onset bowel or bladder incontinence. (Spinal procedures only)  Emergency Numbers:  [facility] business hours (Monday - Thursday, 8:00 AM - 4:00 PM) (Friday, 9:00 AM - 12:00 Noon): [phone]  After hours: [phone] ____________________________________________________________________________________________

## 2017-06-12 ENCOUNTER — Telehealth: Payer: Self-pay

## 2017-06-12 NOTE — Telephone Encounter (Signed)
Post procedure phone call.  Patient states she is doing fine.  

## 2017-07-03 NOTE — Progress Notes (Signed)
Patient's Name: Laura Nielsen  MRN: [id number]  Referring Provider: Birdie Sons, MD  DOB: 01-Nov-1956  PCP: Birdie Sons, MD  DOS: 07/04/2017  Note by: Gaspar Cola, MD  Service setting: Ambulatory outpatient  Specialty: Interventional Pain Management  Location: [facility] (AMB) Pain Management Facility    Patient type: Established   Primary Reason(s) for Visit: Encounter for post-procedure evaluation of chronic illness with mild to moderate exacerbation CC: Neck Pain and Back Pain (lower)  HPI  Laura Nielsen is a 61 y.o. year old, female patient, who comes today for a post-procedure evaluation. She has Allergic rhinitis; Aortic heart valve narrowing; Arthritis; Airway hyperreactivity; CAD in native artery; Carotid artery narrowing; Claudication ([place]); CAFL (chronic airflow limitation) ([place]); Smoking greater than 30 pack years; Narrowing of intervertebral disc space; Clinical depression; Diabetes mellitus, type 2 ([place]); Acid reflux; Hypercholesteremia; Benign essential HTN; Urinary incontinence; Malaise and fatigue; Pins and needles sensation; Obstructive apnea; Adiposity; Arthritis, degenerative; History of abnormal cervical Papanicolaou smear; Peripheral blood vessel disorder ([place]); B12 deficiency; Vitamin D insufficiency; AAA (abdominal aortic aneurysm) without rupture ([place]); Ankle pain; Gout; Atherosclerosis of native arteries of extremity with intermittent claudication ([place]); Chronic pain syndrome; Long term prescription opiate use; Opiate use; Chronic low back pain (Location of Primary Source of Pain) (Bilateral) (L>R); Chronic neck pain (Location of Secondary source of pain) (Bilateral) (R>L); Cervicogenic headache (Right); Chronic upper back pain (Location of Tertiary source of pain) (Bilateral) (R>L); Chronic hip pain (Bilateral) (L>R); Osteoarthritis of hip (Bilateral) (L>R); Cervical central spinal stenosis; Cervical spondylosis with radiculopathy (Right) (C5); Lumbar spondylosis; Lumbar facet  syndrome (Bilateral) (L>R); Lumbar facet hypertrophy (multilevel) (Bilateral); Long term current use of anticoagulant therapy; Neurogenic pain; Musculoskeletal pain; Abnormal MRI, lumbar spine (03/28/2017); and Abnormal MRI, cervical spine (03/28/2017) on her problem list. Her primarily concern today is the Neck Pain and Back Pain (lower)  Pain Assessment: Location:   Neck Radiating: head Onset: More than a month ago Duration: Chronic pain Quality: Sharp Severity: 2 /10 (self-reported pain score)  Note: Reported level is compatible with observation.                   Timing: Constant Modifying factors: Ibuprofen, rest  Laura Nielsen comes in today for post-procedure evaluation after the treatment done on 06/11/2017.  Further details on both, my assessment(s), as well as the proposed treatment plan, please see below.  Post-Procedure Assessment  06/11/2017 Procedure: Diagnostic bilateral lumbar facet block under fluoroscopic guidance and IV sedation Pre-procedure pain score:  2/10 Post-procedure pain score: 0/10 (100% relief) Influential Factors: BMI: 41.05 kg/m Intra-procedural challenges: None observed.         Assessment challenges: None detected.              Reported side-effects: None.        Post-procedural adverse reactions or complications: None reported         Sedation: Sedation provided. When no sedatives are used, the analgesic levels obtained are directly associated to the effectiveness of the local anesthetics. However, when sedation is provided, the level of analgesia obtained during the initial 1 hour following the intervention, is believed to be the result of a combination of factors. These factors may include, but are not limited to: 1. The effectiveness of the local anesthetics used. 2. The effects of the analgesic(s) and/or anxiolytic(s) used. 3. The degree of discomfort experienced by the patient at the time of the procedure. 4. The patients ability and reliability in  recalling and recording the  events. 5. The presence and influence of possible secondary gains and/or psychosocial factors. Reported result: Relief experienced during the 1st hour after the procedure: 100 % (Ultra-Short Term Relief) Interpretative annotation: Clinically appropriate result. Analgesia during this period is likely to be Local Anesthetic and/or IV Sedative (Analgesic/Anxiolytic) related.          Effects of local anesthetic: The analgesic effects attained during this period are directly associated to the localized infiltration of local anesthetics and therefore cary significant diagnostic value as to the etiological location, or anatomical origin, of the pain. Expected duration of relief is directly dependent on the pharmacodynamics of the local anesthetic used. Long-acting (4-6 hours) anesthetics used.  Reported result: Relief during the next 4 to 6 hour after the procedure: 100 % (Short-Term Relief) Interpretative annotation: Clinically appropriate result. Analgesia during this period is likely to be Local Anesthetic-related.          Long-term benefit: Defined as the period of time past the expected duration of local anesthetics (1 hour for short-acting and 4-6 hours for long-acting). With the possible exception of prolonged sympathetic blockade from the local anesthetics, benefits during this period are typically attributed to, or associated with, other factors such as analgesic sensory neuropraxia, antiinflammatory effects, or beneficial biochemical changes provided by agents other than the local anesthetics.  Reported result: Extended relief following procedure: 50 % (Long-Term Relief) Interpretative annotation: Clinically appropriate result. Good relief. No permanent benefit expected. Inflammation plays a part in the etiology to the pain.          Current benefits: Defined as persistent relief that continues at this point in time.   Reported results: Treated area: 50 % Ms. Pino  reports improvement in function Interpretative annotation: Recurrence of symptoms. No permanent benefit expected. Effective diagnostic intervention.          Interpretation: Results would suggest a successful diagnostic intervention.          Laboratory Chemistry  Inflammation Markers (CRP: Acute Phase) (ESR: Chronic Phase) Lab Results  Component Value Date   CRP <0.8 03/08/2017   ESRSEDRATE 5 03/08/2017                 Renal Function Markers Lab Results  Component Value Date   BUN 11 03/08/2017   CREATININE 0.62 03/08/2017   GFRAA >60 03/08/2017   GFRNONAA >60 03/08/2017                 Hepatic Function Markers Lab Results  Component Value Date   AST 28 03/08/2017   ALT 29 03/08/2017   ALBUMIN 3.9 03/08/2017   ALKPHOS 99 03/08/2017                 Electrolytes Lab Results  Component Value Date   NA 138 03/08/2017   K 3.9 03/08/2017   CL 105 03/08/2017   CALCIUM 9.3 03/08/2017   MG 1.8 03/08/2017                 Neuropathy Markers Lab Results  Component Value Date   VITAMINB12 473 03/08/2017                 Bone Pathology Markers Lab Results  Component Value Date   ALKPHOS 99 03/08/2017   VD25OH 21.9 (L) 01/09/2017   25OHVITD1 24 (L) 03/08/2017   25OHVITD2 8.2 03/08/2017   25OHVITD3 16 03/08/2017   CALCIUM 9.3 03/08/2017  Coagulation Parameters Lab Results  Component Value Date   INR 0.9 04/26/2017   LABPROT 9.9 04/26/2017   APTT 28.2 03/28/2013   PLT 303 04/26/2017                 Cardiovascular Markers Lab Results  Component Value Date   HGB 16.4 (H) 05/18/2016   HCT 49.4 (H) 05/18/2016                 Note: Lab results reviewed.  Recent Diagnostic Imaging Review  Dg C-arm 1-60 Min-no Report Result Date: 06/11/2017 Fluoroscopy was utilized by the requesting physician.  No radiographic interpretation.   Note: Imaging results reviewed.          Meds   Current Meds  Medication Sig  . albuterol (PROVENTIL HFA) 108 (90  Base) MCG/ACT inhaler Inhale 2 puffs into the lungs every 6 (six) hours as needed for wheezing or shortness of breath.  . allopurinol (ZYLOPRIM) 100 MG tablet Take 1 tablet (100 mg total) by mouth daily.  Marland Kitchen aspirin 81 MG tablet Take 81 mg by mouth daily.   . bisoprolol (ZEBETA) 10 MG tablet Take 1 tablet (10 mg total) by mouth daily.  . budesonide-formoterol (SYMBICORT) 160-4.5 MCG/ACT inhaler Inhale 2 puffs into the lungs 2 (two) times daily.  . Cholecalciferol (VITAMIN D3) 2000 units capsule Take 1 capsule (2,000 Units total) by mouth daily.  . clopidogrel (PLAVIX) 75 MG tablet Take 1 tablet (75 mg total) by mouth daily.  . Cyanocobalamin (RA VITAMIN B-12 TR) 1000 MCG TBCR Take 2 tablets by mouth daily.   . Ibuprofen 200 MG CAPS Take by mouth.   Marland Kitchen lisinopril (PRINIVIL,ZESTRIL) 20 MG tablet Take 1 tablet (20 mg total) by mouth daily.  Marland Kitchen loratadine (CLARITIN) 10 MG tablet Take 10 mg by mouth daily as needed.   . lovastatin (MEVACOR) 40 MG tablet Take 1 and 1/2 Tablets Daily.  . vitamin C (ASCORBIC ACID) 500 MG tablet Take 500 mg by mouth.     ROS  Constitutional: Denies any fever or chills Gastrointestinal: No reported hemesis, hematochezia, vomiting, or acute GI distress Musculoskeletal: Denies any acute onset joint swelling, redness, loss of ROM, or weakness Neurological: No reported episodes of acute onset apraxia, aphasia, dysarthria, agnosia, amnesia, paralysis, loss of coordination, or loss of consciousness  Allergies  Ms. Schmeling is allergic to atorvastatin and omeprazole.  PFSH  Drug: Ms. Sorey  reports that she does not use drugs. Alcohol:  reports that she drinks alcohol. Tobacco:  reports that she has been smoking Cigarettes.  She has been smoking about 1.00 pack per day. She has never used smokeless tobacco. Medical:  has a past medical history of Allergy; Arthritis; Asthma; Depression; Diabetes mellitus without complication ([place]); Emphysema of lung ([place]); GERD (gastroesophageal  reflux disease); Hyperlipidemia; Hypertension; Osteoporosis; and Tobacco abuse counseling. Surgical: Ms. Brunsman  has a past surgical history that includes vascular stent (03/28/2011); Cardiac catheterization (3/210); Kidney stone surgery (1999); Vaginal hysterectomy; and Tubal ligation. Family: family history includes Alcohol abuse in her father and sister; Breast cancer (age of onset: 22) in her sister; Cancer (age of onset: 36) in her sister; Coronary artery disease in her mother; Coronary artery disease (age of onset: 33) in her sister; Depression in her father; Heart attack in her mother and sister; Heart attack (age of onset: 55) in her father; Hyperlipidemia in her sister; Hypertension in her father, mother, and sister.  Constitutional Exam  General appearance: Well nourished, well developed, and well  hydrated. In no apparent acute distress Vitals:   07/04/17 0803  BP: (!) 175/60  Pulse: (!) 57  Resp: 18  Temp: 98.5 F (36.9 C)  TempSrc: Oral  SpO2: 100%  Weight: 270 lb (122.5 kg)  Height: 5' 8"  (1.610 m)   BMI Assessment: Estimated body mass index is 41.05 kg/m as calculated from the following:   Height as of this encounter: 5' 8"  (1.727 m).   Weight as of this encounter: 270 lb (122.5 kg).  BMI interpretation table: BMI level Category Range association with higher incidence of chronic pain  <18 kg/m2 Underweight   18.5-24.9 kg/m2 Ideal body weight   25-29.9 kg/m2 Overweight Increased incidence by 20%  30-34.9 kg/m2 Obese (Class I) Increased incidence by 68%  35-39.9 kg/m2 Severe obesity (Class II) Increased incidence by 136%  >40 kg/m2 Extreme obesity (Class III) Increased incidence by 254%   BMI Readings from Last 4 Encounters:  07/04/17 41.05 kg/m  06/11/17 40.75 kg/m  05/01/17 41.66 kg/m  04/26/17 40.75 kg/m   Wt Readings from Last 4 Encounters:  07/04/17 270 lb (122.5 kg)  06/11/17 268 lb (121.6 kg)  05/01/17 274 lb (124.3 kg)  04/26/17 268 lb (121.6 kg)    Psych/Mental status: Alert, oriented x 3 (person, place, & time)       Eyes: PERLA Respiratory: No evidence of acute respiratory distress  Cervical Spine Exam  Inspection: No masses, redness, or swelling Alignment: Symmetrical Functional ROM: Unrestricted ROM      Stability: No instability detected Muscle strength & Tone: Functionally intact Sensory: Unimpaired Palpation: No palpable anomalies              Upper Extremity (UE) Exam    Side: Right upper extremity  Side: Left upper extremity  Inspection: No masses, redness, swelling, or asymmetry. No contractures  Inspection: No masses, redness, swelling, or asymmetry. No contractures  Functional ROM: Unrestricted ROM          Functional ROM: Unrestricted ROM          Muscle strength & Tone: Functionally intact  Muscle strength & Tone: Functionally intact  Sensory: Unimpaired  Sensory: Unimpaired  Palpation: No palpable anomalies              Palpation: No palpable anomalies              Specialized Test(s): Deferred         Specialized Test(s): Deferred          Thoracic Spine Exam  Inspection: No masses, redness, or swelling Alignment: Symmetrical Functional ROM: Unrestricted ROM Stability: No instability detected Sensory: Unimpaired Muscle strength & Tone: No palpable anomalies  Lumbar Spine Exam  Inspection: No masses, redness, or swelling Alignment: Symmetrical Functional ROM: Improved after treatment      Stability: No instability detected Muscle strength & Tone: Functionally intact Sensory: Improved Palpation: Complains of area being tender to palpation       Provocative Tests: Lumbar Hyperextension and rotation test: Positive bilaterally for facet joint pain. Lumbar Lateral bending test: evaluation deferred today       Patrick's Maneuver: evaluation deferred today                    Gait & Posture Assessment  Ambulation: Unassisted Gait: Relatively normal for age and body habitus Posture: WNL   Lower Extremity  Exam    Side: Right lower extremity  Side: Left lower extremity  Inspection: No masses, redness, swelling, or asymmetry. No contractures  Inspection: No masses, redness, swelling, or asymmetry. No contractures  Functional ROM: Unrestricted ROM          Functional ROM: Unrestricted ROM          Muscle strength & Tone: Functionally intact  Muscle strength & Tone: Functionally intact  Sensory: Unimpaired  Sensory: Unimpaired  Palpation: No palpable anomalies  Palpation: No palpable anomalies   Assessment  Primary Diagnosis & Pertinent Problem List: The primary encounter diagnosis was Chronic low back pain (Location of Primary Source of Pain) (Bilateral) (L>R). Diagnoses of Chronic neck pain (Location of Secondary source of pain) (Bilateral) (R>L), Chronic upper back pain (Location of Tertiary source of pain) (Bilateral) (R>L), Chronic pain syndrome, Lumbar facet syndrome (Bilateral) (L>R), Abnormal MRI, lumbar spine (03/28/2017), and Abnormal MRI, cervical spine (03/28/2017) were also pertinent to this visit.  Status Diagnosis  Improved Controlled Controlled 1. Chronic low back pain (Location of Primary Source of Pain) (Bilateral) (L>R)   2. Chronic neck pain (Location of Secondary source of pain) (Bilateral) (R>L)   3. Chronic upper back pain (Location of Tertiary source of pain) (Bilateral) (R>L)   4. Chronic pain syndrome   5. Lumbar facet syndrome (Bilateral) (L>R)   6. Abnormal MRI, lumbar spine (03/28/2017)   7. Abnormal MRI, cervical spine (03/28/2017)     Problems updated and reviewed during this visit: Problem  Abnormal MRI, lumbar spine (03/28/2017)   Disc levels:  Disc spaces: Degenerative disc disease with disc height loss at T12-L1. Mild degenerative disc disease with disc height loss at L1-2 and L4-5.  T11-12: Mild broad based disc bulge. No evidence of neural foraminal stenosis. No central canal stenosis.  T12-L1: Mild broad based disc bulge with a central disc  protrusion. Right lateral recess stenosis. Mild bilateral facet arthropathy. Mild spinal stenosis.  L1-L2: Broad-based disc bulge with a tiny right paracentral disc protrusion. Mild spinal stenosis. Mild bilateral facet arthropathy. No evidence of neural foraminal stenosis.  L2-L3: Broad-based disc bulge flattening the ventral thecal sac. Moderate bilateral facet arthropathy, right worse than left. Bilateral lateral recess stenosis. Moderate spinal stenosis. No evidence of neural foraminal stenosis.  L3-L4: Broad-based disc bulge with a right lateral disc protrusion. Moderate right and mild left facet arthropathy with bilateral lateral recess narrowing. Mild right foraminal stenosis. No left foraminal stenosis. No central canal stenosis.  L4-L5: Broad-based disc bulge. Severe left facet arthropathy with severe left ligamentum flavum infolding resulting in severe left lateral recess stenosis. Mild right facet arthropathy. Moderate left foraminal stenosis. Mild spinal stenosis.  L5-S1: No significant disc bulge. No evidence of neural foraminal stenosis. No central canal stenosis. Mild bilateral facet arthropathy.  IMPRESSION: 1. At L4-5 there is a broad-based disc bulge. Severe left facet arthropathy with severe left ligamentum flavum infolding resulting in severe left lateral recess stenosis. Mild right facet arthropathy. Moderate left foraminal stenosis. Mild spinal stenosis. 2. At L3-4 there is a broad-based disc bulge with a right lateral disc protrusion. Moderate right and mild left facet arthropathy with bilateral lateral recess narrowing. Mild right foraminal stenosis. No left foraminal stenosis. 3. At L2-3 there is a broad-based disc bulge flattening the ventral thecal sac. Moderate bilateral facet arthropathy, right worse than left. Bilateral lateral recess stenosis. Moderate spinal stenosis. 4. At L1-2 there is a broad-based disc bulge with a tiny  right paracentral disc protrusion. Mild spinal stenosis. Mild bilateral facet arthropathy. 5. At T12-L1 there is a broad based disc bulge with a central disc protrusion. Right lateral recess stenosis. Mild bilateral  facet arthropathy. Mild spinal stenosis.   Abnormal MRI, cervical spine (03/28/2017)   Disc levels:  C2-3: Fat arthropathy with bulky spurring greater on the right. Negative disc. Patent canal and foramina  C3-4: Facet arthropathy with bulky spurring causing anterolisthesis. Patent canal and bilateral foramina.  C4-5: Advanced disc degeneration with posterior disc osteophyte complex, left eccentric. Bilateral uncovertebral spurring. Facet spurring is mild. Bilateral foraminal impingement. Canal stenosis leading to mild chronic left cord flattening.  C5-6: Advanced disc degeneration with ridging and central disc protrusion. Uncovertebral spurring. Negative facets. Spinal stenosis with mild cord flattening. Bilateral foraminal impingement.  C6-7: Advanced disc degeneration with posterior disc osteophyte complex contacting the ventral cord. Bilateral foraminal impingement, moderate range in greater on the left.  C7-T1:Advanced facet arthropathy with anterolisthesis. No impingement  T2-3: Advanced facet arthropathy with mild moderate right foraminal narrowing.  IMPRESSION: 1. Disc degeneration that is advanced from C4-5 to C6-7. Posterior disc osteophyte complexes cause spinal stenosis with mild cord flattening at C4-5 and C5-6. Bilateral foraminal impingement at these levels from uncovertebral ridging. 2. Multilevel advanced facet arthropathy with chronic anterolisthesis at C3-4 and C7-T1.    Plan of Care  Pharmacotherapy (Medications Ordered): No orders of the defined types were placed in this encounter.  New Prescriptions   No medications on file   Medications administered today: Ms. Kreiter had no medications administered during this  visit.  Lab-work, procedure(s), and/or referral(s): Orders Placed This Encounter  Procedures  . LUMBAR FACET(MEDIAL BRANCH NERVE BLOCK) MB[facility]    Interventional management options: Planned, scheduled, and/or pending:   Not at this time.   Considering:   Diagnostic bilateral lumbar facet block #2 Possible bilateral lumbar facet RFA  Diagnostic right-sided cervical epidural steroid injection Diagnostic bilateral cervical facet block Possible bilateral cervical facet RFA  Diagnostic intra-articular hip joint injection Diagnostic bilateral femoral nerve and obturator nerve block Possible bilateral femoral nerve and obturator nerve RFA  Diagnostic right-sided occipital nerve block  Possible right-sided occipital nerve RFA  Possible right-sided occipital peripheral nerve stimulator trial    Palliative PRN treatment(s):   Diagnostic bilateral lumbar facet block #2    Provider-requested follow-up: Return for PRN Procedure: Bilateral lumbar facet block #2.  Future Appointments Date Time Provider [place]  08/30/2017 8:00 AM Birdie Sons, MD [facility] None  02/25/2018 8:30 AM [facility] None  02/25/2018 9:00 AM [facility] None  02/25/2018 10:00 AM Schnier, Dolores Lory, MD [facility] None   Primary Care Physician: Birdie Sons, MD Location: [facility] Note by: Gaspar Cola, MD Date: 07/04/2017; Time: 9:20 AM  Patient Instructions   GENERAL RISKS AND COMPLICATIONS  What are the risk, side effects and possible complications? Generally speaking, most procedures are safe.  However, with any procedure there are risks, side effects, and the possibility of complications.  The risks and complications are dependent upon the sites that are lesioned, or the type of nerve block to be performed.  The closer the procedure is to the spine, the more serious the risks are.  Great care is taken when placing the radio frequency needles,  block needles or lesioning probes, but sometimes complications can occur. 1. Infection: Any time there is an injection through the skin, there is a risk of infection.  This is why sterile conditions are used for these blocks.  There are four possible types of infection. 1. Localized skin infection. 2. Central Nervous System Infection-This can be in the form of Meningitis, which  can be deadly. 3. Epidural Infections-This can be in the form of an epidural abscess, which can cause pressure inside of the spine, causing compression of the spinal cord with subsequent paralysis. This would require an emergency surgery to decompress, and there are no guarantees that the patient would recover from the paralysis. 4. Discitis-This is an infection of the intervertebral discs.  It occurs in about 1% of discography procedures.  It is difficult to treat and it may lead to surgery.        2. Pain: the needles have to go through skin and soft tissues, will cause soreness.       3. Damage to internal structures:  The nerves to be lesioned may be near blood vessels or    other nerves which can be potentially damaged.       4. Bleeding: Bleeding is more common if the patient is taking blood thinners such as  aspirin, Coumadin, Ticiid, Plavix, etc., or if he/she have some genetic predisposition  such as hemophilia. Bleeding into the spinal canal can cause compression of the spinal  cord with subsequent paralysis.  This would require an emergency surgery to  decompress and there are no guarantees that the patient would recover from the  paralysis.       5. Pneumothorax:  Puncturing of a lung is a possibility, every time a needle is introduced in  the area of the chest or upper back.  Pneumothorax refers to free air around the  collapsed lung(s), inside of the thoracic cavity (chest cavity).  Another two possible  complications related to a similar event would include: Hemothorax and Chylothorax.   These are variations of the  Pneumothorax, where instead of air around the collapsed  lung(s), you may have blood or chyle, respectively.       6. Spinal headaches: They may occur with any procedures in the area of the spine.       7. Persistent CSF (Cerebro-Spinal Fluid) leakage: This is a rare problem, but may occur  with prolonged intrathecal or epidural catheters either due to the formation of a fistulous  track or a dural tear.       8. Nerve damage: By working so close to the spinal cord, there is always a possibility of  nerve damage, which could be as serious as a permanent spinal cord injury with  paralysis.       9. Death:  Although rare, severe deadly allergic reactions known as "Anaphylactic  reaction" can occur to any of the medications used.      10. Worsening of the symptoms:  We can always make thing worse.  What are the chances of something like this happening? Chances of any of this occuring are extremely low.  By statistics, you have more of a chance of getting killed in a motor vehicle accident: while driving to the hospital than any of the above occurring .  Nevertheless, you should be aware that they are possibilities.  In general, it is similar to taking a shower.  Everybody knows that you can slip, hit your head and get killed.  Does that mean that you should not shower again?  Nevertheless always keep in mind that statistics do not mean anything if you happen to be on the wrong side of them.  Even if a procedure has a 1 (one) in a 1,000,000 (million) chance of going wrong, it you happen to be that one..Also, keep in mind that by statistics, you have  more of a chance of having something go wrong when taking medications.  Who should not have this procedure? If you are on a blood thinning medication (e.g. Coumadin, Plavix, see list of "Blood Thinners"), or if you have an active infection going on, you should not have the procedure.  If you are taking any blood thinners, please inform your physician.  How  should I prepare for this procedure?  Do not eat or drink anything at least six hours prior to the procedure.  Bring a driver with you .  It cannot be a taxi.  Come accompanied by an adult that can drive you back, and that is strong enough to help you if your legs get weak or numb from the local anesthetic.  Take all of your medicines the morning of the procedure with just enough water to swallow them.  If you have diabetes, make sure that you are scheduled to have your procedure done first thing in the morning, whenever possible.  If you have diabetes, take only half of your insulin dose and notify our nurse that you have done so as soon as you arrive at the clinic.  If you are diabetic, but only take blood sugar pills (oral hypoglycemic), then do not take them on the morning of your procedure.  You may take them after you have had the procedure.  Do not take aspirin or any aspirin-containing medications, at least eleven (11) days prior to the procedure.  They may prolong bleeding.  Wear loose fitting clothing that may be easy to take off and that you would not mind if it got stained with Betadine or blood.  Do not wear any jewelry or perfume  Remove any nail coloring.  It will interfere with some of our monitoring equipment.  NOTE: Remember that this is not meant to be interpreted as a complete list of all possible complications.  Unforeseen problems may occur.  BLOOD THINNERS The following drugs contain aspirin or other products, which can cause increased bleeding during surgery and should not be taken for 2 weeks prior to and 1 week after surgery.  If you should need take something for relief of minor pain, you may take acetaminophen which is found in Tylenol,m Datril, Anacin-3 and Panadol. It is not blood thinner. The products listed below are.  Do not take any of the products listed below in addition to any listed on your instruction sheet.  A.P.C or A.P.C with Codeine Codeine  Phosphate Capsules #3 Ibuprofen Ridaura  ABC compound Congesprin Imuran rimadil  Advil Cope Indocin Robaxisal  Alka-Seltzer Effervescent Pain Reliever and Antacid Coricidin or Coricidin-D  Indomethacin Rufen  Alka-Seltzer plus Cold Medicine Cosprin Ketoprofen S-A-C Tablets  Anacin Analgesic Tablets or Capsules Coumadin Korlgesic Salflex  Anacin Extra Strength Analgesic tablets or capsules CP-2 Tablets Lanoril Salicylate  Anaprox Cuprimine Capsules Levenox Salocol  Anexsia-D Dalteparin Magan Salsalate  Anodynos Darvon compound Magnesium Salicylate Sine-off  Ansaid Dasin Capsules Magsal Sodium Salicylate  Anturane Depen Capsules Marnal Soma  APF Arthritis pain formula Dewitt's Pills Measurin Stanback  Argesic Dia-Gesic Meclofenamic Sulfinpyrazone  Arthritis Bayer Timed Release Aspirin Diclofenac Meclomen Sulindac  Arthritis pain formula Anacin Dicumarol Medipren Supac  Analgesic (Safety coated) Arthralgen Diffunasal Mefanamic Suprofen  Arthritis Strength Bufferin Dihydrocodeine Mepro Compound Suprol  Arthropan liquid Dopirydamole Methcarbomol with Aspirin Synalgos  ASA tablets/Enseals Disalcid Micrainin Tagament  Ascriptin Doan's Midol Talwin  Ascriptin A/D Dolene Mobidin Tanderil  Ascriptin Extra Strength Dolobid Moblgesic Ticlid  Ascriptin with Codeine Doloprin or  Doloprin with Codeine Momentum Tolectin  Asperbuf Duoprin Mono-gesic Trendar  Aspergum Duradyne Motrin or Motrin IB Triminicin  Aspirin plain, buffered or enteric coated Durasal Myochrisine Trigesic  Aspirin Suppositories Easprin Nalfon Trillsate  Aspirin with Codeine Ecotrin Regular or Extra Strength Naprosyn Uracel  Atromid-S Efficin Naproxen Ursinus  Auranofin Capsules Elmiron Neocylate Vanquish  Axotal Emagrin Norgesic Verin  Azathioprine Empirin or Empirin with Codeine Normiflo Vitamin E  Azolid Emprazil Nuprin Voltaren  Bayer Aspirin plain, buffered or children's or timed BC Tablets or powders Encaprin Orgaran  Warfarin Sodium  Buff-a-Comp Enoxaparin Orudis Zorpin  Buff-a-Comp with Codeine Equegesic Os-Cal-Gesic   Buffaprin Excedrin plain, buffered or Extra Strength Oxalid   Bufferin Arthritis Strength Feldene Oxphenbutazone   Bufferin plain or Extra Strength Feldene Capsules Oxycodone with Aspirin   Bufferin with Codeine Fenoprofen Fenoprofen Pabalate or Pabalate-SF   Buffets II Flogesic Panagesic   Buffinol plain or Extra Strength Florinal or Florinal with Codeine Panwarfarin   Buf-Tabs Flurbiprofen Penicillamine   Butalbital Compound Four-way cold tablets Penicillin   Butazolidin Fragmin Pepto-Bismol   Carbenicillin Geminisyn Percodan   Carna Arthritis Reliever Geopen Persantine   Carprofen Gold's salt Persistin   Chloramphenicol Goody's Phenylbutazone   Chloromycetin Haltrain Piroxlcam   Clmetidine heparin Plaquenil   Cllnoril Hyco-pap Ponstel   Clofibrate Hydroxy chloroquine Propoxyphen         Before stopping any of these medications, be sure to consult the physician who ordered them.  Some, such as Coumadin (Warfarin) are ordered to prevent or treat serious conditions such as "deep thrombosis", "pumonary embolisms", and other heart problems.  The amount of time that you may need off of the medication may also vary with the medication and the reason for which you were taking it.  If you are taking any of these medications, please make sure you notify your pain physician before you undergo any procedures.         Facet Joint Block The facet joints connect the bones of the spine (vertebrae). They make it possible for you to bend, twist, and make other movements with your spine. They also keep you from bending too far, twisting too far, and making other excessive movements. A facet joint block is a procedure where a numbing medicine (anesthetic) is injected into a facet joint. Often, a type of anti-inflammatory medicine called a steroid is also injected. A facet joint block may be done  to diagnose neck or back pain. If the pain gets better after a facet joint block, it means the pain is probably coming from the facet joint. If the pain does not get better, it means the pain is probably not coming from the facet joint. A facet joint block may also be done to relieve neck or back pain caused by an inflamed facet joint. A facet joint block is only done to relieve pain if the pain does not improve with other methods, such as medicine, exercise programs, and physical therapy. Tell a health care provider about:  Any allergies you have.  All medicines you are taking, including vitamins, herbs, eye drops, creams, and over-the-counter medicines.  Any problems you or family members have had with anesthetic medicines.  Any blood disorders you have.  Any surgeries you have had.  Any medical conditions you have.  Whether you are pregnant or may be pregnant. What are the risks? Generally, this is a safe procedure. However, problems may occur, including:  Bleeding.  Injury to a nerve near the injection site.  Pain at  the injection site.  Weakness or numbness in areas controlled by nerves near the injection site.  Infection.  Temporary fluid retention.  Allergic reactions to medicines or dyes.  Injury to other structures or organs near the injection site.  What happens before the procedure?  Follow instructions from your health care provider about eating or drinking restrictions.  Ask your health care provider about: ? Changing or stopping your regular medicines. This is especially important if you are taking diabetes medicines or blood thinners. ? Taking medicines such as aspirin and ibuprofen. These medicines can thin your blood. Do not take these medicines before your procedure if your health care provider instructs you not to.  Do not take any new dietary supplements or medicines without asking your health care provider first.  Plan to have someone take you home  after the procedure. What happens during the procedure?  You may need to remove your clothing and dress in an open-back gown.  The procedure will be done while you are lying on an X-ray table. You will most likely be asked to lie on your stomach, but you may be asked to lie in a different position if an injection will be made in your neck.  Machines will be used to monitor your oxygen levels, heart rate, and blood pressure.  If an injection will be made in your neck, an IV tube will be inserted into one of your veins. Fluids and medicine will flow directly into your body through the IV tube.  The area over the facet joint where the injection will be made will be cleaned with soap. The surrounding skin will be covered with clean drapes.  A numbing medicine (local anesthetic) will be applied to your skin. Your skin may sting or burn for a moment.  A video X-ray machine (fluoroscopy) will be used to locate the joint. In some cases, a CT scan may be used.  A contrast dye may be injected into the facet joint area to help locate the joint.  When the joint is located, an anesthetic will be injected into the joint through the needle.  Your health care provider will ask you whether you feel pain relief. If you do feel relief, a steroid may be injected to provide pain relief for a longer period of time. If you do not feel relief or feel only partial relief, additional injections of an anesthetic may be made in other facet joints.  The needle will be removed.  Your skin will be cleaned.  A bandage (dressing) will be applied over each injection site. The procedure may vary among health care providers and hospitals. What happens after the procedure?  You will be observed for 15-30 minutes before being allowed to go home. This information is not intended to replace advice given to you by your health care provider. Make sure you discuss any questions you have with your health care  provider. Document Released: 04/11/2007 Document Revised: 12/22/2015 Document Reviewed: 08/16/2015 Elsevier Interactive Patient Education  2018 [employer]  What are the risk, side effects and possible complications? Generally speaking, most procedures are safe.  However, with any procedure there are risks, side effects, and the possibility of complications.  The risks and complications are dependent upon the sites that are lesioned, or the type of nerve block to be performed.  The closer the procedure is to the spine, the more serious the risks are.  Great care is taken when placing the radio frequency  needles, block needles or lesioning probes, but sometimes complications can occur. 2. Infection: Any time there is an injection through the skin, there is a risk of infection.  This is why sterile conditions are used for these blocks.  There are four possible types of infection. 1. Localized skin infection. 2. Central Nervous System Infection-This can be in the form of Meningitis, which can be deadly. 3. Epidural Infections-This can be in the form of an epidural abscess, which can cause pressure inside of the spine, causing compression of the spinal cord with subsequent paralysis. This would require an emergency surgery to decompress, and there are no guarantees that the patient would recover from the paralysis. 4. Discitis-This is an infection of the intervertebral discs.  It occurs in about 1% of discography procedures.  It is difficult to treat and it may lead to surgery.        2. Pain: the needles have to go through skin and soft tissues, will cause soreness.       3. Damage to internal structures:  The nerves to be lesioned may be near blood vessels or    other nerves which can be potentially damaged.       4. Bleeding: Bleeding is more common if the patient is taking blood thinners such as  aspirin, Coumadin, Ticiid, Plavix, etc., or if he/she have some  genetic predisposition  such as hemophilia. Bleeding into the spinal canal can cause compression of the spinal  cord with subsequent paralysis.  This would require an emergency surgery to  decompress and there are no guarantees that the patient would recover from the  paralysis.       5. Pneumothorax:  Puncturing of a lung is a possibility, every time a needle is introduced in  the area of the chest or upper back.  Pneumothorax refers to free air around the  collapsed lung(s), inside of the thoracic cavity (chest cavity).  Another two possible  complications related to a similar event would include: Hemothorax and Chylothorax.   These are variations of the Pneumothorax, where instead of air around the collapsed  lung(s), you may have blood or chyle, respectively.       6. Spinal headaches: They may occur with any procedures in the area of the spine.       7. Persistent CSF (Cerebro-Spinal Fluid) leakage: This is a rare problem, but may occur  with prolonged intrathecal or epidural catheters either due to the formation of a fistulous  track or a dural tear.       8. Nerve damage: By working so close to the spinal cord, there is always a possibility of  nerve damage, which could be as serious as a permanent spinal cord injury with  paralysis.       9. Death:  Although rare, severe deadly allergic reactions known as "Anaphylactic  reaction" can occur to any of the medications used.      10. Worsening of the symptoms:  We can always make thing worse.  What are the chances of something like this happening? Chances of any of this occuring are extremely low.  By statistics, you have more of a chance of getting killed in a motor vehicle accident: while driving to the hospital than any of the above occurring .  Nevertheless, you should be aware that they are possibilities.  In general, it is similar to taking a shower.  Everybody knows that you can slip, hit your head and get killed.  Does that mean that you should  not shower again?  Nevertheless always keep in mind that statistics do not mean anything if you happen to be on the wrong side of them.  Even if a procedure has a 1 (one) in a 1,000,000 (million) chance of going wrong, it you happen to be that one..Also, keep in mind that by statistics, you have more of a chance of having something go wrong when taking medications.  Who should not have this procedure? If you are on a blood thinning medication (e.g. Coumadin, Plavix, see list of "Blood Thinners"), or if you have an active infection going on, you should not have the procedure.  If you are taking any blood thinners, please inform your physician.  How should I prepare for this procedure?  Do not eat or drink anything at least six hours prior to the procedure.  Bring a driver with you .  It cannot be a taxi.  Come accompanied by an adult that can drive you back, and that is strong enough to help you if your legs get weak or numb from the local anesthetic.  Take all of your medicines the morning of the procedure with just enough water to swallow them.  If you have diabetes, make sure that you are scheduled to have your procedure done first thing in the morning, whenever possible.  If you have diabetes, take only half of your insulin dose and notify our nurse that you have done so as soon as you arrive at the clinic.  If you are diabetic, but only take blood sugar pills (oral hypoglycemic), then do not take them on the morning of your procedure.  You may take them after you have had the procedure.  Do not take aspirin or any aspirin-containing medications, at least eleven (11) days prior to the procedure.  They may prolong bleeding.  Wear loose fitting clothing that may be easy to take off and that you would not mind if it got stained with Betadine or blood.  Do not wear any jewelry or perfume  Remove any nail coloring.  It will interfere with some of our monitoring equipment.  NOTE: Remember  that this is not meant to be interpreted as a complete list of all possible complications.  Unforeseen problems may occur.  BLOOD THINNERS The following drugs contain aspirin or other products, which can cause increased bleeding during surgery and should not be taken for 2 weeks prior to and 1 week after surgery.  If you should need take something for relief of minor pain, you may take acetaminophen which is found in Tylenol,m Datril, Anacin-3 and Panadol. It is not blood thinner. The products listed below are.  Do not take any of the products listed below in addition to any listed on your instruction sheet.  A.P.C or A.P.C with Codeine Codeine Phosphate Capsules #3 Ibuprofen Ridaura  ABC compound Congesprin Imuran rimadil  Advil Cope Indocin Robaxisal  Alka-Seltzer Effervescent Pain Reliever and Antacid Coricidin or Coricidin-D  Indomethacin Rufen  Alka-Seltzer plus Cold Medicine Cosprin Ketoprofen S-A-C Tablets  Anacin Analgesic Tablets or Capsules Coumadin Korlgesic Salflex  Anacin Extra Strength Analgesic tablets or capsules CP-2 Tablets Lanoril Salicylate  Anaprox Cuprimine Capsules Levenox Salocol  Anexsia-D Dalteparin Magan Salsalate  Anodynos Darvon compound Magnesium Salicylate Sine-off  Ansaid Dasin Capsules Magsal Sodium Salicylate  Anturane Depen Capsules Marnal Soma  APF Arthritis pain formula Dewitt's Pills Measurin Stanback  Argesic Dia-Gesic Meclofenamic Sulfinpyrazone  Arthritis Bayer Timed Release Aspirin Diclofenac  Meclomen Sulindac  Arthritis pain formula Anacin Dicumarol Medipren Supac  Analgesic (Safety coated) Arthralgen Diffunasal Mefanamic Suprofen  Arthritis Strength Bufferin Dihydrocodeine Mepro Compound Suprol  Arthropan liquid Dopirydamole Methcarbomol with Aspirin Synalgos  ASA tablets/Enseals Disalcid Micrainin Tagament  Ascriptin Doan's Midol Talwin  Ascriptin A/D Dolene Mobidin Tanderil  Ascriptin Extra Strength Dolobid Moblgesic Ticlid  Ascriptin with  Codeine Doloprin or Doloprin with Codeine Momentum Tolectin  Asperbuf Duoprin Mono-gesic Trendar  Aspergum Duradyne Motrin or Motrin IB Triminicin  Aspirin plain, buffered or enteric coated Durasal Myochrisine Trigesic  Aspirin Suppositories Easprin Nalfon Trillsate  Aspirin with Codeine Ecotrin Regular or Extra Strength Naprosyn Uracel  Atromid-S Efficin Naproxen Ursinus  Auranofin Capsules Elmiron Neocylate Vanquish  Axotal Emagrin Norgesic Verin  Azathioprine Empirin or Empirin with Codeine Normiflo Vitamin E  Azolid Emprazil Nuprin Voltaren  Bayer Aspirin plain, buffered or children's or timed BC Tablets or powders Encaprin Orgaran Warfarin Sodium  Buff-a-Comp Enoxaparin Orudis Zorpin  Buff-a-Comp with Codeine Equegesic Os-Cal-Gesic   Buffaprin Excedrin plain, buffered or Extra Strength Oxalid   Bufferin Arthritis Strength Feldene Oxphenbutazone   Bufferin plain or Extra Strength Feldene Capsules Oxycodone with Aspirin   Bufferin with Codeine Fenoprofen Fenoprofen Pabalate or Pabalate-SF   Buffets II Flogesic Panagesic   Buffinol plain or Extra Strength Florinal or Florinal with Codeine Panwarfarin   Buf-Tabs Flurbiprofen Penicillamine   Butalbital Compound Four-way cold tablets Penicillin   Butazolidin Fragmin Pepto-Bismol   Carbenicillin Geminisyn Percodan   Carna Arthritis Reliever Geopen Persantine   Carprofen Gold's salt Persistin   Chloramphenicol Goody's Phenylbutazone   Chloromycetin Haltrain Piroxlcam   Clmetidine heparin Plaquenil   Cllnoril Hyco-pap Ponstel   Clofibrate Hydroxy chloroquine Propoxyphen         Before stopping any of these medications, be sure to consult the physician who ordered them.  Some, such as Coumadin (Warfarin) are ordered to prevent or treat serious conditions such as "deep thrombosis", "pumonary embolisms", and other heart problems.  The amount of time that you may need off of the medication may also vary with the medication and the reason for  which you were taking it.  If you are taking any of these medications, please make sure you notify your pain physician before you undergo any procedures.         Facet Blocks Patient Information  Description: The facets are joints in the spine between the vertebrae.  Like any joints in the body, facets can become irritated and painful.  Arthritis can also effect the facets.  By injecting steroids and local anesthetic in and around these joints, we can temporarily block the nerve supply to them.  Steroids act directly on irritated nerves and tissues to reduce selling and inflammation which often leads to decreased pain.  Facet blocks may be done anywhere along the spine from the neck to the low back depending upon the location of your pain.   After numbing the skin with local anesthetic (like Novocaine), a small needle is passed onto the facet joints under x-ray guidance.  You may experience a sensation of pressure while this is being done.  The entire block usually lasts about 15-25 minutes.   Conditions which may be treated by facet blocks:   Low back/buttock pain  Neck/shoulder pain  Certain types of headaches  Preparation for the injection:  1. Do not eat any solid food or dairy products within 8 hours of your appointment. 2. You may drink clear liquid up to 3 hours before appointment.  Clear  liquids include water, black coffee, juice or soda.  No milk or cream please. 3. You may take your regular medication, including pain medications, with a sip of water before your appointment.  Diabetics should hold regular insulin (if taken separately) and take 1/2 normal NPH dose the morning of the procedure.  Carry some sugar containing items with you to your appointment. 4. A driver must accompany you and be prepared to drive you home after your procedure. 5. Bring all your current medications with you. 6. An IV may be inserted and sedation may be given at the discretion of the  physician. 7. A blood pressure cuff, EKG and other monitors will often be applied during the procedure.  Some patients may need to have extra oxygen administered for a short period. 8. You will be asked to provide medical information, including your allergies and medications, prior to the procedure.  We must know immediately if you are taking blood thinners (like Coumadin/Warfarin) or if you are allergic to IV iodine contrast (dye).  We must know if you could possible be pregnant.  Possible side-effects:   Bleeding from needle site  Infection (rare, may require surgery)  Nerve injury (rare)  Numbness & tingling (temporary)  Difficulty urinating (rare, temporary)  Spinal headache (a headache worse with upright posture)  Light-headedness (temporary)  Pain at injection site (serveral days)  Decreased blood pressure (rare, temporary)  Weakness in arm/leg (temporary)  Pressure sensation in back/neck (temporary)   Call if you experience:   Fever/chills associated with headache or increased back/neck pain  Headache worsened by an upright position  New onset, weakness or numbness of an extremity below the injection site  Hives or difficulty breathing (go to the emergency room)  Inflammation or drainage at the injection site(s)  Severe back/neck pain greater than usual  New symptoms which are concerning to you  Please note:  Although the local anesthetic injected can often make your back or neck feel good for several hours after the injection, the pain will likely return. It takes 3-7 days for steroids to work.  You may not notice any pain relief for at least one week.  If effective, we will often do a series of 2-3 injections spaced 3-6 weeks apart to maximally decrease your pain.  After the initial series, you may be a candidate for a more permanent nerve block of the facets.  If you have any questions, please call #[phone]) [facility]

## 2017-07-04 ENCOUNTER — Encounter: Payer: Self-pay | Admitting: Pain Medicine

## 2017-07-04 ENCOUNTER — Ambulatory Visit: Payer: Medicare Other | Attending: Pain Medicine | Admitting: Pain Medicine

## 2017-07-04 VITALS — BP 175/60 | HR 57 | Temp 98.5°F | Resp 18 | Ht 68.0 in | Wt 270.0 lb

## 2017-07-04 DIAGNOSIS — K219 Gastro-esophageal reflux disease without esophagitis: Secondary | ICD-10-CM | POA: Diagnosis not present

## 2017-07-04 DIAGNOSIS — E785 Hyperlipidemia, unspecified: Secondary | ICD-10-CM | POA: Insufficient documentation

## 2017-07-04 DIAGNOSIS — Z79899 Other long term (current) drug therapy: Secondary | ICD-10-CM | POA: Diagnosis not present

## 2017-07-04 DIAGNOSIS — Z811 Family history of alcohol abuse and dependence: Secondary | ICD-10-CM | POA: Insufficient documentation

## 2017-07-04 DIAGNOSIS — Z79891 Long term (current) use of opiate analgesic: Secondary | ICD-10-CM | POA: Insufficient documentation

## 2017-07-04 DIAGNOSIS — M1990 Unspecified osteoarthritis, unspecified site: Secondary | ICD-10-CM | POA: Diagnosis not present

## 2017-07-04 DIAGNOSIS — F17210 Nicotine dependence, cigarettes, uncomplicated: Secondary | ICD-10-CM | POA: Diagnosis not present

## 2017-07-04 DIAGNOSIS — Z803 Family history of malignant neoplasm of breast: Secondary | ICD-10-CM | POA: Diagnosis not present

## 2017-07-04 DIAGNOSIS — J45909 Unspecified asthma, uncomplicated: Secondary | ICD-10-CM | POA: Insufficient documentation

## 2017-07-04 DIAGNOSIS — J439 Emphysema, unspecified: Secondary | ICD-10-CM | POA: Diagnosis not present

## 2017-07-04 DIAGNOSIS — Z8249 Family history of ischemic heart disease and other diseases of the circulatory system: Secondary | ICD-10-CM | POA: Diagnosis not present

## 2017-07-04 DIAGNOSIS — M546 Pain in thoracic spine: Secondary | ICD-10-CM

## 2017-07-04 DIAGNOSIS — F329 Major depressive disorder, single episode, unspecified: Secondary | ICD-10-CM | POA: Insufficient documentation

## 2017-07-04 DIAGNOSIS — G894 Chronic pain syndrome: Secondary | ICD-10-CM

## 2017-07-04 DIAGNOSIS — E119 Type 2 diabetes mellitus without complications: Secondary | ICD-10-CM | POA: Diagnosis not present

## 2017-07-04 DIAGNOSIS — I10 Essential (primary) hypertension: Secondary | ICD-10-CM | POA: Diagnosis not present

## 2017-07-04 DIAGNOSIS — M810 Age-related osteoporosis without current pathological fracture: Secondary | ICD-10-CM | POA: Insufficient documentation

## 2017-07-04 DIAGNOSIS — M545 Low back pain: Secondary | ICD-10-CM

## 2017-07-04 DIAGNOSIS — M542 Cervicalgia: Secondary | ICD-10-CM | POA: Diagnosis not present

## 2017-07-04 DIAGNOSIS — G8929 Other chronic pain: Secondary | ICD-10-CM

## 2017-07-04 DIAGNOSIS — M47816 Spondylosis without myelopathy or radiculopathy, lumbar region: Secondary | ICD-10-CM

## 2017-07-04 DIAGNOSIS — Z818 Family history of other mental and behavioral disorders: Secondary | ICD-10-CM | POA: Diagnosis not present

## 2017-07-04 DIAGNOSIS — R937 Abnormal findings on diagnostic imaging of other parts of musculoskeletal system: Secondary | ICD-10-CM | POA: Diagnosis not present

## 2017-07-04 DIAGNOSIS — I6523 Occlusion and stenosis of bilateral carotid arteries: Secondary | ICD-10-CM

## 2017-07-04 DIAGNOSIS — Z888 Allergy status to other drugs, medicaments and biological substances status: Secondary | ICD-10-CM | POA: Insufficient documentation

## 2017-07-04 DIAGNOSIS — M4696 Unspecified inflammatory spondylopathy, lumbar region: Secondary | ICD-10-CM

## 2017-07-04 NOTE — Patient Instructions (Addendum)
GENERAL RISKS AND COMPLICATIONS  What are the risk, side effects and possible complications? Generally speaking, most procedures are safe.  However, with any procedure there are risks, side effects, and the possibility of complications.  The risks and complications are dependent upon the sites that are lesioned, or the type of nerve block to be performed.  The closer the procedure is to the spine, the more serious the risks are.  Great care is taken when placing the radio frequency needles, block needles or lesioning probes, but sometimes complications can occur. 1. Infection: Any time there is an injection through the skin, there is a risk of infection.  This is why sterile conditions are used for these blocks.  There are four possible types of infection. 1. Localized skin infection. 2. Central Nervous System Infection-This can be in the form of Meningitis, which can be deadly. 3. Epidural Infections-This can be in the form of an epidural abscess, which can cause pressure inside of the spine, causing compression of the spinal cord with subsequent paralysis. This would require an emergency surgery to decompress, and there are no guarantees that the patient would recover from the paralysis. 4. Discitis-This is an infection of the intervertebral discs.  It occurs in about 1% of discography procedures.  It is difficult to treat and it may lead to surgery.        2. Pain: the needles have to go through skin and soft tissues, will cause soreness.       3. Damage to internal structures:  The nerves to be lesioned may be near blood vessels or    other nerves which can be potentially damaged.       4. Bleeding: Bleeding is more common if the patient is taking blood thinners such as  aspirin, Coumadin, Ticiid, Plavix, etc., or if he/she have some genetic predisposition  such as hemophilia. Bleeding into the spinal canal can cause compression of the spinal  cord with subsequent paralysis.  This would require an  emergency surgery to  decompress and there are no guarantees that the patient would recover from the  paralysis.       5. Pneumothorax:  Puncturing of a lung is a possibility, every time a needle is introduced in  the area of the chest or upper back.  Pneumothorax refers to free air around the  collapsed lung(s), inside of the thoracic cavity (chest cavity).  Another two possible  complications related to a similar event would include: Hemothorax and Chylothorax.   These are variations of the Pneumothorax, where instead of air around the collapsed  lung(s), you may have blood or chyle, respectively.       6. Spinal headaches: They may occur with any procedures in the area of the spine.       7. Persistent CSF (Cerebro-Spinal Fluid) leakage: This is a rare problem, but may occur  with prolonged intrathecal or epidural catheters either due to the formation of a fistulous  track or a dural tear.       8. Nerve damage: By working so close to the spinal cord, there is always a possibility of  nerve damage, which could be as serious as a permanent spinal cord injury with  paralysis.       9. Death:  Although rare, severe deadly allergic reactions known as "Anaphylactic  reaction" can occur to any of the medications used.      10. Worsening of the symptoms:  We can always make thing worse.    What are the chances of something like this happening? Chances of any of this occuring are extremely low.  By statistics, you have more of a chance of getting killed in a motor vehicle accident: while driving to the hospital than any of the above occurring .  Nevertheless, you should be aware that they are possibilities.  In general, it is similar to taking a shower.  Everybody knows that you can slip, hit your head and get killed.  Does that mean that you should not shower again?  Nevertheless always keep in mind that statistics do not mean anything if you happen to be on the wrong side of them.  Even if a procedure has a 1  (one) in a 1,000,000 (million) chance of going wrong, it you happen to be that one..Also, keep in mind that by statistics, you have more of a chance of having something go wrong when taking medications.  Who should not have this procedure? If you are on a blood thinning medication (e.g. Coumadin, Plavix, see list of "Blood Thinners"), or if you have an active infection going on, you should not have the procedure.  If you are taking any blood thinners, please inform your physician.  How should I prepare for this procedure?  Do not eat or drink anything at least six hours prior to the procedure.  Bring a driver with you .  It cannot be a taxi.  Come accompanied by an adult that can drive you back, and that is strong enough to help you if your legs get weak or numb from the local anesthetic.  Take all of your medicines the morning of the procedure with just enough water to swallow them.  If you have diabetes, make sure that you are scheduled to have your procedure done first thing in the morning, whenever possible.  If you have diabetes, take only half of your insulin dose and notify our nurse that you have done so as soon as you arrive at the clinic.  If you are diabetic, but only take blood sugar pills (oral hypoglycemic), then do not take them on the morning of your procedure.  You may take them after you have had the procedure.  Do not take aspirin or any aspirin-containing medications, at least eleven (11) days prior to the procedure.  They may prolong bleeding.  Wear loose fitting clothing that may be easy to take off and that you would not mind if it got stained with Betadine or blood.  Do not wear any jewelry or perfume  Remove any nail coloring.  It will interfere with some of our monitoring equipment.  NOTE: Remember that this is not meant to be interpreted as a complete list of all possible complications.  Unforeseen problems may occur.  BLOOD THINNERS The following drugs  contain aspirin or other products, which can cause increased bleeding during surgery and should not be taken for 2 weeks prior to and 1 week after surgery.  If you should need take something for relief of minor pain, you may take acetaminophen which is found in Tylenol,m Datril, Anacin-3 and Panadol. It is not blood thinner. The products listed below are.  Do not take any of the products listed below in addition to any listed on your instruction sheet.  A.P.C or A.P.C with Codeine Codeine Phosphate Capsules #3 Ibuprofen Ridaura  ABC compound Congesprin Imuran rimadil  Advil Cope Indocin Robaxisal  Alka-Seltzer Effervescent Pain Reliever and Antacid Coricidin or Coricidin-D  Indomethacin Rufen    Alka-Seltzer plus Cold Medicine Cosprin Ketoprofen S-A-C Tablets  Anacin Analgesic Tablets or Capsules Coumadin Korlgesic Salflex  Anacin Extra Strength Analgesic tablets or capsules CP-2 Tablets Lanoril Salicylate  Anaprox Cuprimine Capsules Levenox Salocol  Anexsia-D Dalteparin Magan Salsalate  Anodynos Darvon compound Magnesium Salicylate Sine-off  Ansaid Dasin Capsules Magsal Sodium Salicylate  Anturane Depen Capsules Marnal Soma  APF Arthritis pain formula Dewitt's Pills Measurin Stanback  Argesic Dia-Gesic Meclofenamic Sulfinpyrazone  Arthritis Bayer Timed Release Aspirin Diclofenac Meclomen Sulindac  Arthritis pain formula Anacin Dicumarol Medipren Supac  Analgesic (Safety coated) Arthralgen Diffunasal Mefanamic Suprofen  Arthritis Strength Bufferin Dihydrocodeine Mepro Compound Suprol  Arthropan liquid Dopirydamole Methcarbomol with Aspirin Synalgos  ASA tablets/Enseals Disalcid Micrainin Tagament  Ascriptin Doan's Midol Talwin  Ascriptin A/D Dolene Mobidin Tanderil  Ascriptin Extra Strength Dolobid Moblgesic Ticlid  Ascriptin with Codeine Doloprin or Doloprin with Codeine Momentum Tolectin  Asperbuf Duoprin Mono-gesic Trendar  Aspergum Duradyne Motrin or Motrin IB Triminicin  Aspirin  plain, buffered or enteric coated Durasal Myochrisine Trigesic  Aspirin Suppositories Easprin Nalfon Trillsate  Aspirin with Codeine Ecotrin Regular or Extra Strength Naprosyn Uracel  Atromid-S Efficin Naproxen Ursinus  Auranofin Capsules Elmiron Neocylate Vanquish  Axotal Emagrin Norgesic Verin  Azathioprine Empirin or Empirin with Codeine Normiflo Vitamin E  Azolid Emprazil Nuprin Voltaren  Bayer Aspirin plain, buffered or children's or timed BC Tablets or powders Encaprin Orgaran Warfarin Sodium  Buff-a-Comp Enoxaparin Orudis Zorpin  Buff-a-Comp with Codeine Equegesic Os-Cal-Gesic   Buffaprin Excedrin plain, buffered or Extra Strength Oxalid   Bufferin Arthritis Strength Feldene Oxphenbutazone   Bufferin plain or Extra Strength Feldene Capsules Oxycodone with Aspirin   Bufferin with Codeine Fenoprofen Fenoprofen Pabalate or Pabalate-SF   Buffets II Flogesic Panagesic   Buffinol plain or Extra Strength Florinal or Florinal with Codeine Panwarfarin   Buf-Tabs Flurbiprofen Penicillamine   Butalbital Compound Four-way cold tablets Penicillin   Butazolidin Fragmin Pepto-Bismol   Carbenicillin Geminisyn Percodan   Carna Arthritis Reliever Geopen Persantine   Carprofen Gold's salt Persistin   Chloramphenicol Goody's Phenylbutazone   Chloromycetin Haltrain Piroxlcam   Clmetidine heparin Plaquenil   Cllnoril Hyco-pap Ponstel   Clofibrate Hydroxy chloroquine Propoxyphen         Before stopping any of these medications, be sure to consult the physician who ordered them.  Some, such as Coumadin (Warfarin) are ordered to prevent or treat serious conditions such as "deep thrombosis", "pumonary embolisms", and other heart problems.  The amount of time that you may need off of the medication may also vary with the medication and the reason for which you were taking it.  If you are taking any of these medications, please make sure you notify your pain physician before you undergo any  procedures.         Facet Joint Block The facet joints connect the bones of the spine (vertebrae). They make it possible for you to bend, twist, and make other movements with your spine. They also keep you from bending too far, twisting too far, and making other excessive movements. A facet joint block is a procedure where a numbing medicine (anesthetic) is injected into a facet joint. Often, a type of anti-inflammatory medicine called a steroid is also injected. A facet joint block may be done to diagnose neck or back pain. If the pain gets better after a facet joint block, it means the pain is probably coming from the facet joint. If the pain does not get better, it means the pain is probably not coming   from the facet joint. A facet joint block may also be done to relieve neck or back pain caused by an inflamed facet joint. A facet joint block is only done to relieve pain if the pain does not improve with other methods, such as medicine, exercise programs, and physical therapy. Tell a health care provider about:  Any allergies you have.  All medicines you are taking, including vitamins, herbs, eye drops, creams, and over-the-counter medicines.  Any problems you or family members have had with anesthetic medicines.  Any blood disorders you have.  Any surgeries you have had.  Any medical conditions you have.  Whether you are pregnant or may be pregnant. What are the risks? Generally, this is a safe procedure. However, problems may occur, including:  Bleeding.  Injury to a nerve near the injection site.  Pain at the injection site.  Weakness or numbness in areas controlled by nerves near the injection site.  Infection.  Temporary fluid retention.  Allergic reactions to medicines or dyes.  Injury to other structures or organs near the injection site.  What happens before the procedure?  Follow instructions from your health care provider about eating or drinking  restrictions.  Ask your health care provider about: ? Changing or stopping your regular medicines. This is especially important if you are taking diabetes medicines or blood thinners. ? Taking medicines such as aspirin and ibuprofen. These medicines can thin your blood. Do not take these medicines before your procedure if your health care provider instructs you not to.  Do not take any new dietary supplements or medicines without asking your health care provider first.  Plan to have someone take you home after the procedure. What happens during the procedure?  You may need to remove your clothing and dress in an open-back gown.  The procedure will be done while you are lying on an X-ray table. You will most likely be asked to lie on your stomach, but you may be asked to lie in a different position if an injection will be made in your neck.  Machines will be used to monitor your oxygen levels, heart rate, and blood pressure.  If an injection will be made in your neck, an IV tube will be inserted into one of your veins. Fluids and medicine will flow directly into your body through the IV tube.  The area over the facet joint where the injection will be made will be cleaned with soap. The surrounding skin will be covered with clean drapes.  A numbing medicine (local anesthetic) will be applied to your skin. Your skin may sting or burn for a moment.  A video X-ray machine (fluoroscopy) will be used to locate the joint. In some cases, a CT scan may be used.  A contrast dye may be injected into the facet joint area to help locate the joint.  When the joint is located, an anesthetic will be injected into the joint through the needle.  Your health care provider will ask you whether you feel pain relief. If you do feel relief, a steroid may be injected to provide pain relief for a longer period of time. If you do not feel relief or feel only partial relief, additional injections of an anesthetic  may be made in other facet joints.  The needle will be removed.  Your skin will be cleaned.  A bandage (dressing) will be applied over each injection site. The procedure may vary among health care providers and hospitals. What happens   after the procedure?  You will be observed for 15-30 minutes before being allowed to go home. This information is not intended to replace advice given to you by your health care provider. Make sure you discuss any questions you have with your health care provider. Document Released: 04/11/2007 Document Revised: 12/22/2015 Document Reviewed: 08/16/2015 Elsevier Interactive Patient Education  2018 [place]  What are the risk, side effects and possible complications? Generally speaking, most procedures are safe.  However, with any procedure there are risks, side effects, and the possibility of complications.  The risks and complications are dependent upon the sites that are lesioned, or the type of nerve block to be performed.  The closer the procedure is to the spine, the more serious the risks are.  Great care is taken when placing the radio frequency needles, block needles or lesioning probes, but sometimes complications can occur. 2. Infection: Any time there is an injection through the skin, there is a risk of infection.  This is why sterile conditions are used for these blocks.  There are four possible types of infection. 1. Localized skin infection. 2. Central Nervous System Infection-This can be in the form of Meningitis, which can be deadly. 3. Epidural Infections-This can be in the form of an epidural abscess, which can cause pressure inside of the spine, causing compression of the spinal cord with subsequent paralysis. This would require an emergency surgery to decompress, and there are no guarantees that the patient would recover from the paralysis. 4. Discitis-This is an infection of the intervertebral discs.  It  occurs in about 1% of discography procedures.  It is difficult to treat and it may lead to surgery.        2. Pain: the needles have to go through skin and soft tissues, will cause soreness.       3. Damage to internal structures:  The nerves to be lesioned may be near blood vessels or    other nerves which can be potentially damaged.       4. Bleeding: Bleeding is more common if the patient is taking blood thinners such as  aspirin, Coumadin, Ticiid, Plavix, etc., or if he/she have some genetic predisposition  such as hemophilia. Bleeding into the spinal canal can cause compression of the spinal  cord with subsequent paralysis.  This would require an emergency surgery to  decompress and there are no guarantees that the patient would recover from the  paralysis.       5. Pneumothorax:  Puncturing of a lung is a possibility, every time a needle is introduced in  the area of the chest or upper back.  Pneumothorax refers to free air around the  collapsed lung(s), inside of the thoracic cavity (chest cavity).  Another two possible  complications related to a similar event would include: Hemothorax and Chylothorax.   These are variations of the Pneumothorax, where instead of air around the collapsed  lung(s), you may have blood or chyle, respectively.       6. Spinal headaches: They may occur with any procedures in the area of the spine.       7. Persistent CSF (Cerebro-Spinal Fluid) leakage: This is a rare problem, but may occur  with prolonged intrathecal or epidural catheters either due to the formation of a fistulous  track or a dural tear.       8. Nerve damage: By working so close to the spinal cord, there is always a possibility  of  nerve damage, which could be as serious as a permanent spinal cord injury with  paralysis.       9. Death:  Although rare, severe deadly allergic reactions known as "Anaphylactic  reaction" can occur to any of the medications used.      10. Worsening of the symptoms:  We can  always make thing worse.  What are the chances of something like this happening? Chances of any of this occuring are extremely low.  By statistics, you have more of a chance of getting killed in a motor vehicle accident: while driving to the hospital than any of the above occurring .  Nevertheless, you should be aware that they are possibilities.  In general, it is similar to taking a shower.  Everybody knows that you can slip, hit your head and get killed.  Does that mean that you should not shower again?  Nevertheless always keep in mind that statistics do not mean anything if you happen to be on the wrong side of them.  Even if a procedure has a 1 (one) in a 1,000,000 (million) chance of going wrong, it you happen to be that one..Also, keep in mind that by statistics, you have more of a chance of having something go wrong when taking medications.  Who should not have this procedure? If you are on a blood thinning medication (e.g. Coumadin, Plavix, see list of "Blood Thinners"), or if you have an active infection going on, you should not have the procedure.  If you are taking any blood thinners, please inform your physician.  How should I prepare for this procedure?  Do not eat or drink anything at least six hours prior to the procedure.  Bring a driver with you .  It cannot be a taxi.  Come accompanied by an adult that can drive you back, and that is strong enough to help you if your legs get weak or numb from the local anesthetic.  Take all of your medicines the morning of the procedure with just enough water to swallow them.  If you have diabetes, make sure that you are scheduled to have your procedure done first thing in the morning, whenever possible.  If you have diabetes, take only half of your insulin dose and notify our nurse that you have done so as soon as you arrive at the clinic.  If you are diabetic, but only take blood sugar pills (oral hypoglycemic), then do not take them on  the morning of your procedure.  You may take them after you have had the procedure.  Do not take aspirin or any aspirin-containing medications, at least eleven (11) days prior to the procedure.  They may prolong bleeding.  Wear loose fitting clothing that may be easy to take off and that you would not mind if it got stained with Betadine or blood.  Do not wear any jewelry or perfume  Remove any nail coloring.  It will interfere with some of our monitoring equipment.  NOTE: Remember that this is not meant to be interpreted as a complete list of all possible complications.  Unforeseen problems may occur.  BLOOD THINNERS The following drugs contain aspirin or other products, which can cause increased bleeding during surgery and should not be taken for 2 weeks prior to and 1 week after surgery.  If you should need take something for relief of minor pain, you may take acetaminophen which is found in Tylenol,m Datril, Anacin-3 and Panadol. It is  not blood thinner. The products listed below are.  Do not take any of the products listed below in addition to any listed on your instruction sheet.  A.P.C or A.P.C with Codeine Codeine Phosphate Capsules #3 Ibuprofen Ridaura  ABC compound Congesprin Imuran rimadil  Advil Cope Indocin Robaxisal  Alka-Seltzer Effervescent Pain Reliever and Antacid Coricidin or Coricidin-D  Indomethacin Rufen  Alka-Seltzer plus Cold Medicine Cosprin Ketoprofen S-A-C Tablets  Anacin Analgesic Tablets or Capsules Coumadin Korlgesic Salflex  Anacin Extra Strength Analgesic tablets or capsules CP-2 Tablets Lanoril Salicylate  Anaprox Cuprimine Capsules Levenox Salocol  Anexsia-D Dalteparin Magan Salsalate  Anodynos Darvon compound Magnesium Salicylate Sine-off  Ansaid Dasin Capsules Magsal Sodium Salicylate  Anturane Depen Capsules Marnal Soma  APF Arthritis pain formula Dewitt's Pills Measurin Stanback  Argesic Dia-Gesic Meclofenamic Sulfinpyrazone  Arthritis Bayer Timed  Release Aspirin Diclofenac Meclomen Sulindac  Arthritis pain formula Anacin Dicumarol Medipren Supac  Analgesic (Safety coated) Arthralgen Diffunasal Mefanamic Suprofen  Arthritis Strength Bufferin Dihydrocodeine Mepro Compound Suprol  Arthropan liquid Dopirydamole Methcarbomol with Aspirin Synalgos  ASA tablets/Enseals Disalcid Micrainin Tagament  Ascriptin Doan's Midol Talwin  Ascriptin A/D Dolene Mobidin Tanderil  Ascriptin Extra Strength Dolobid Moblgesic Ticlid  Ascriptin with Codeine Doloprin or Doloprin with Codeine Momentum Tolectin  Asperbuf Duoprin Mono-gesic Trendar  Aspergum Duradyne Motrin or Motrin IB Triminicin  Aspirin plain, buffered or enteric coated Durasal Myochrisine Trigesic  Aspirin Suppositories Easprin Nalfon Trillsate  Aspirin with Codeine Ecotrin Regular or Extra Strength Naprosyn Uracel  Atromid-S Efficin Naproxen Ursinus  Auranofin Capsules Elmiron Neocylate Vanquish  Axotal Emagrin Norgesic Verin  Azathioprine Empirin or Empirin with Codeine Normiflo Vitamin E  Azolid Emprazil Nuprin Voltaren  Bayer Aspirin plain, buffered or children's or timed BC Tablets or powders Encaprin Orgaran Warfarin Sodium  Buff-a-Comp Enoxaparin Orudis Zorpin  Buff-a-Comp with Codeine Equegesic Os-Cal-Gesic   Buffaprin Excedrin plain, buffered or Extra Strength Oxalid   Bufferin Arthritis Strength Feldene Oxphenbutazone   Bufferin plain or Extra Strength Feldene Capsules Oxycodone with Aspirin   Bufferin with Codeine Fenoprofen Fenoprofen Pabalate or Pabalate-SF   Buffets II Flogesic Panagesic   Buffinol plain or Extra Strength Florinal or Florinal with Codeine Panwarfarin   Buf-Tabs Flurbiprofen Penicillamine   Butalbital Compound Four-way cold tablets Penicillin   Butazolidin Fragmin Pepto-Bismol   Carbenicillin Geminisyn Percodan   Carna Arthritis Reliever Geopen Persantine   Carprofen Gold's salt Persistin   Chloramphenicol Goody's Phenylbutazone   Chloromycetin  Haltrain Piroxlcam   Clmetidine heparin Plaquenil   Cllnoril Hyco-pap Ponstel   Clofibrate Hydroxy chloroquine Propoxyphen         Before stopping any of these medications, be sure to consult the physician who ordered them.  Some, such as Coumadin (Warfarin) are ordered to prevent or treat serious conditions such as "deep thrombosis", "pumonary embolisms", and other heart problems.  The amount of time that you may need off of the medication may also vary with the medication and the reason for which you were taking it.  If you are taking any of these medications, please make sure you notify your pain physician before you undergo any procedures.         Facet Blocks Patient Information  Description: The facets are joints in the spine between the vertebrae.  Like any joints in the body, facets can become irritated and painful.  Arthritis can also effect the facets.  By injecting steroids and local anesthetic in and around these joints, we can temporarily block the nerve supply to them.  Steroids act  directly on irritated nerves and tissues to reduce selling and inflammation which often leads to decreased pain.  Facet blocks may be done anywhere along the spine from the neck to the low back depending upon the location of your pain.   After numbing the skin with local anesthetic (like Novocaine), a small needle is passed onto the facet joints under x-ray guidance.  You may experience a sensation of pressure while this is being done.  The entire block usually lasts about 15-25 minutes.   Conditions which may be treated by facet blocks:   Low back/buttock pain  Neck/shoulder pain  Certain types of headaches  Preparation for the injection:  1. Do not eat any solid food or dairy products within 8 hours of your appointment. 2. You may drink clear liquid up to 3 hours before appointment.  Clear liquids include water, black coffee, juice or soda.  No milk or cream please. 3. You may take your  regular medication, including pain medications, with a sip of water before your appointment.  Diabetics should hold regular insulin (if taken separately) and take 1/2 normal NPH dose the morning of the procedure.  Carry some sugar containing items with you to your appointment. 4. A driver must accompany you and be prepared to drive you home after your procedure. 5. Bring all your current medications with you. 6. An IV may be inserted and sedation may be given at the discretion of the physician. 7. A blood pressure cuff, EKG and other monitors will often be applied during the procedure.  Some patients may need to have extra oxygen administered for a short period. 8. You will be asked to provide medical information, including your allergies and medications, prior to the procedure.  We must know immediately if you are taking blood thinners (like Coumadin/Warfarin) or if you are allergic to IV iodine contrast (dye).  We must know if you could possible be pregnant.  Possible side-effects:   Bleeding from needle site  Infection (rare, may require surgery)  Nerve injury (rare)  Numbness & tingling (temporary)  Difficulty urinating (rare, temporary)  Spinal headache (a headache worse with upright posture)  Light-headedness (temporary)  Pain at injection site (serveral days)  Decreased blood pressure (rare, temporary)  Weakness in arm/leg (temporary)  Pressure sensation in back/neck (temporary)   Call if you experience:   Fever/chills associated with headache or increased back/neck pain  Headache worsened by an upright position  New onset, weakness or numbness of an extremity below the injection site  Hives or difficulty breathing (go to the emergency room)  Inflammation or drainage at the injection site(s)  Severe back/neck pain greater than usual  New symptoms which are concerning to you  Please note:  Although the local anesthetic injected can often make your back or  neck feel good for several hours after the injection, the pain will likely return. It takes 3-7 days for steroids to work.  You may not notice any pain relief for at least one week.  If effective, we will often do a series of 2-3 injections spaced 3-6 weeks apart to maximally decrease your pain.  After the initial series, you may be a candidate for a more permanent nerve block of the facets.  If you have any questions, please call #[phone]) [facility]

## 2017-07-04 NOTE — Progress Notes (Signed)
Safety precautions to be maintained throughout the outpatient stay will include: orient to surroundings, keep bed in low position, maintain call bell within reach at all times, provide assistance with transfer out of bed and ambulation.  

## 2017-07-05 ENCOUNTER — Ambulatory Visit: Payer: Federal, State, Local not specified - PPO | Admitting: Pain Medicine

## 2017-07-23 ENCOUNTER — Other Ambulatory Visit: Payer: Self-pay | Admitting: Family Medicine

## 2017-07-26 ENCOUNTER — Emergency Department
Admission: EM | Admit: 2017-07-26 | Discharge: 2017-07-26 | Disposition: A | Discharge status: Home / Self Care | Payer: Medicare Other | Attending: Student in an Organized Health Care Education/Training Program | Admitting: Student in an Organized Health Care Education/Training Program

## 2017-07-26 ENCOUNTER — Encounter: Payer: Self-pay | Admitting: Emergency Medicine

## 2017-07-26 ENCOUNTER — Emergency Department: Payer: Medicare Other

## 2017-07-26 DIAGNOSIS — F17210 Nicotine dependence, cigarettes, uncomplicated: Secondary | ICD-10-CM | POA: Diagnosis not present

## 2017-07-26 DIAGNOSIS — Z7901 Long term (current) use of anticoagulants: Secondary | ICD-10-CM | POA: Diagnosis not present

## 2017-07-26 DIAGNOSIS — Y92009 Unspecified place in unspecified non-institutional (private) residence as the place of occurrence of the external cause: Secondary | ICD-10-CM | POA: Insufficient documentation

## 2017-07-26 DIAGNOSIS — S93491A Sprain of other ligament of right ankle, initial encounter: Secondary | ICD-10-CM

## 2017-07-26 DIAGNOSIS — E119 Type 2 diabetes mellitus without complications: Secondary | ICD-10-CM | POA: Insufficient documentation

## 2017-07-26 DIAGNOSIS — W010XXA Fall on same level from slipping, tripping and stumbling without subsequent striking against object, initial encounter: Secondary | ICD-10-CM | POA: Insufficient documentation

## 2017-07-26 DIAGNOSIS — J45909 Unspecified asthma, uncomplicated: Secondary | ICD-10-CM | POA: Diagnosis not present

## 2017-07-26 DIAGNOSIS — Z79899 Other long term (current) drug therapy: Secondary | ICD-10-CM | POA: Insufficient documentation

## 2017-07-26 DIAGNOSIS — S99911A Unspecified injury of right ankle, initial encounter: Secondary | ICD-10-CM | POA: Diagnosis not present

## 2017-07-26 DIAGNOSIS — S93431A Sprain of tibiofibular ligament of right ankle, initial encounter: Secondary | ICD-10-CM | POA: Insufficient documentation

## 2017-07-26 DIAGNOSIS — Y939 Activity, unspecified: Secondary | ICD-10-CM | POA: Diagnosis not present

## 2017-07-26 DIAGNOSIS — S90911A Unspecified superficial injury of right ankle, initial encounter: Secondary | ICD-10-CM | POA: Diagnosis present

## 2017-07-26 DIAGNOSIS — Y999 Unspecified external cause status: Secondary | ICD-10-CM | POA: Diagnosis not present

## 2017-07-26 DIAGNOSIS — I10 Essential (primary) hypertension: Secondary | ICD-10-CM | POA: Insufficient documentation

## 2017-07-26 DIAGNOSIS — Z7982 Long term (current) use of aspirin: Secondary | ICD-10-CM | POA: Diagnosis not present

## 2017-07-26 DIAGNOSIS — M25571 Pain in right ankle and joints of right foot: Secondary | ICD-10-CM | POA: Diagnosis not present

## 2017-07-26 DIAGNOSIS — M7989 Other specified soft tissue disorders: Secondary | ICD-10-CM | POA: Diagnosis not present

## 2017-07-26 MED ORDER — MELOXICAM 15 MG PO TABS: 15.0000 mg | ORAL_TABLET | Freq: Every day | ORAL | 0 refills | Status: DC

## 2017-07-26 NOTE — ED Notes (Signed)
See triage note  States she stepped wrong on stepping stone  Twisted right ankle  Positive swelling  Good pulses increased pain with standing

## 2017-07-26 NOTE — ED Provider Notes (Signed)
[facility] Emergency Department Provider Note  ____________________________________________  Time seen: Approximately 7:33 PM  I have reviewed the triage vital signs and the nursing notes.   HISTORY  Chief Complaint Ankle Pain    HPI Laura Nielsen is a 61 y.o. female who presents emergency department complaining of right ankle pain. Patient reports that she was walking in her house when she stepped on a rock causing her ankle to "well." Patient did not fall or hit her head. Patient is endorsing pain to the anteromedial aspect of the right ankle. Patient denies any other complaints or injuries at this time.  Patient has a significant history of osteoarthritis, osteoporosis, GERD, diabetes, emphysema, hypertension, hyperlipidemia. Patient denies any complaints with these chronic medical problems at this time.   Past Medical History:  Diagnosis Date  . Allergy   . Arthritis   . Asthma   . Depression   . Diabetes mellitus without complication ([place])   . Emphysema of lung ([place])   . GERD (gastroesophageal reflux disease)   . Hyperlipidemia   . Hypertension   . Osteoporosis   . Tobacco abuse counseling     Patient Active Problem List   Diagnosis Date Noted  . Abnormal MRI, lumbar spine (03/28/2017) 07/04/2017  . Abnormal MRI, cervical spine (03/28/2017) 07/04/2017  . Lumbar facet syndrome (Bilateral) (L>R) 04/26/2017  . Lumbar facet hypertrophy (multilevel) (Bilateral) 04/26/2017  . Long term current use of anticoagulant therapy 04/26/2017  . Neurogenic pain 04/26/2017  . Musculoskeletal pain 04/26/2017  . Chronic pain syndrome 03/08/2017  . Long term prescription opiate use 03/08/2017  . Opiate use 03/08/2017  . Chronic low back pain (Location of Primary Source of Pain) (Bilateral) (L>R) 03/08/2017  . Chronic neck pain (Location of Secondary source of pain) (Bilateral) (R>L) 03/08/2017  . Cervicogenic headache (Right) 03/08/2017  . Chronic upper back  pain (Location of Tertiary source of pain) (Bilateral) (R>L) 03/08/2017  . Chronic hip pain (Bilateral) (L>R) 03/08/2017  . Osteoarthritis of hip (Bilateral) (L>R) 03/08/2017  . Cervical central spinal stenosis 03/08/2017  . Cervical spondylosis with radiculopathy (Right) (C5) 03/08/2017  . Lumbar spondylosis 03/08/2017  . Atherosclerosis of native arteries of extremity with intermittent claudication ([place]) 02/22/2017  . Gout 02/16/2016  . Ankle pain 11/17/2015  . Arthritis 05/14/2015  . Carotid artery narrowing 05/14/2015  . Claudication ([place]) 05/14/2015  . CAFL (chronic airflow limitation) ([place]) 05/14/2015  . Clinical depression 05/14/2015  . Acid reflux 05/14/2015  . Urinary incontinence 05/14/2015  . Pins and needles sensation 05/14/2015  . Obstructive apnea 05/14/2015  . Adiposity 05/14/2015  . History of abnormal cervical Papanicolaou smear 05/14/2015  . Peripheral blood vessel disorder ([place]) 05/14/2015  . B12 deficiency 05/14/2015  . Vitamin D insufficiency 05/14/2015  . AAA (abdominal aortic aneurysm) without rupture ([place]) 05/26/2014  . Aortic heart valve narrowing 01/31/2013  . Malaise and fatigue 09/26/2012  . CAD in native artery 03/17/2009  . Diabetes mellitus, type 2 ([place]) 02/05/2009  . Hypercholesteremia 06/24/2008  . Allergic rhinitis 10/25/2007  . Airway hyperreactivity 10/25/2007  . Smoking greater than 30 pack years 10/25/2007  . Narrowing of intervertebral disc space 10/25/2007  . Benign essential HTN 10/25/2007  . Arthritis, degenerative 10/25/2007    Past Surgical History:  Procedure Laterality Date  . Cardiac catheterization  3/210   70-80% stenosis RCA stent placed. started on Plavix  . [place]  . TUBAL LIGATION    . VAGINAL HYSTERECTOMY     Menometrorrhagia. Excessive  bleeding  . vascular stent  03/28/2011   Dr. Delana Meyer, [facility]; Infrarenal    Prior to Admission medications   Medication Sig Start Date End Date Taking? Authorizing  Provider  albuterol (PROVENTIL HFA) 108 (90 Base) MCG/ACT inhaler Inhale 2 puffs into the lungs every 6 (six) hours as needed for wheezing or shortness of breath. 04/12/17   Birdie Sons, MD  allopurinol (ZYLOPRIM) 100 MG tablet Take 1 tablet (100 mg total) by mouth daily. 05/23/17   Birdie Sons, MD  amitriptyline (ELAVIL) 25 MG tablet Take 1 tablet (25 mg total) by mouth at bedtime. 04/26/17 05/26/17  Milinda Pointer, MD  aspirin 81 MG tablet Take 81 mg by mouth daily.  03/25/09   [provider]  bisoprolol (ZEBETA) 10 MG tablet Take 1 tablet (10 mg total) by mouth daily. 02/19/17   Birdie Sons, MD  budesonide-formoterol [facility]) 160-4.5 MCG/ACT inhaler Inhale 2 puffs into the lungs 2 (two) times daily. 05/01/17   Birdie Sons, MD  Cholecalciferol (VITAMIN D3) 2000 units capsule Take 1 capsule (2,000 Units total) by mouth daily. 03/14/17   Milinda Pointer, MD  clopidogrel (PLAVIX) 75 MG tablet Take 1 tablet (75 mg total) by mouth daily. 07/23/17   Birdie Sons, MD  Cyanocobalamin (RA VITAMIN B-12 TR) 1000 MCG TBCR Take 2 tablets by mouth daily.     [provider]  gabapentin (NEURONTIN) 300 MG capsule Take 1 capsule (300 mg total) by mouth 3 (three) times daily. 04/26/17 05/26/17  Milinda Pointer, MD  Ibuprofen 200 MG CAPS Take by mouth.     [provider]  lisinopril (PRINIVIL,ZESTRIL) 20 MG tablet Take 1 tablet (20 mg total) by mouth daily. 10/03/16   Birdie Sons, MD  loratadine (CLARITIN) 10 MG tablet Take 10 mg by mouth daily as needed.     [provider]  lovastatin (MEVACOR) 40 MG tablet Take 1 and 1/2 Tablets Daily. 01/24/17   Birdie Sons, MD  meloxicam (MOBIC) 15 MG tablet Take 1 tablet (15 mg total) by mouth daily. 07/26/17   Cuthriell, Charline Bills, PA-C  oxyCODONE (ROXICODONE) 5 MG immediate release tablet Take 1 tablet (5 mg total) by mouth every 6 (six) hours as needed for severe pain. 04/26/17 05/26/17  Milinda Pointer, MD  vitamin C (ASCORBIC ACID) 500 MG tablet Take 500 mg by mouth.     [provider]    Allergies Atorvastatin and Omeprazole  Family History  Problem Relation Age of Onset  . Hypertension Mother   . Coronary artery disease Mother   . Heart attack Mother        acute  . Alcohol abuse Father   . Depression Father   . Hypertension Father   . Heart attack Father 61       acute  . Alcohol abuse Sister   . Hyperlipidemia Sister   . Hypertension Sister   . Cancer Sister 78  . Heart attack Sister        x's 2  . Coronary artery disease Sister 43       x's 2  . Breast cancer Sister 103    Social History Social History  Substance Use Topics  . Smoking status: Current Every Day Smoker    Packs/day: 1.00    Types: Cigarettes  . Smokeless tobacco: Never Used     Comment: previously smoked over 2 ppd  . Alcohol use Yes     Comment: Rarely  Review of Systems  Constitutional: No fever/chills Cardiovascular: no chest pain. Respiratory: no cough. No SOB. Musculoskeletal: Positive for right ankle pain Skin: Negative for rash, abrasions, lacerations, ecchymosis. Neurological: Negative for headaches, focal weakness or numbness. 10-point ROS otherwise negative.  ____________________________________________   PHYSICAL EXAM:  VITAL SIGNS: ED Triage Vitals  Enc Vitals Group     BP 07/26/17 1828 (!) 166/86     Pulse Rate 07/26/17 1828 78     Resp 07/26/17 1828 (!) 22     Temp 07/26/17 1828 98.2 F (36.8 C)     Temp Source 07/26/17 1828 Oral     SpO2 07/26/17 1828 95 %     Weight 07/26/17 1829 268 lb (121.6 kg)     Height 07/26/17 1829 5' 8.5" (1.74 m)     Head Circumference --      Peak Flow --      Pain Score 07/26/17 1832 8     Pain Loc --      Pain Edu? --      Excl. in [place]? --      Constitutional: Alert and oriented. Well appearing and in no acute distress. Eyes: Conjunctivae are normal. PERRL. EOMI. Head: Atraumatic. Neck: No stridor.     Cardiovascular: Normal rate, regular rhythm. Normal S1 and S2.  Good peripheral circulation. Respiratory: Normal respiratory effort without tachypnea or retractions. Lungs CTAB. Good air entry to the bases with no decreased or absent breath sounds. Musculoskeletal: Full range of motion to all extremities. No gross deformities appreciated.No gross deformity or edema noted to right ankle but inspection. Full range of motion with coaxing. Patient is tender to palpation along the talonavicular joint line and the anterior talofibular ligament. No palpable abnormality. Dorsalis pedis pulse intact. Sensation intact 5 digits. Neurologic:  Normal speech and language. No gross focal neurologic deficits are appreciated.  Skin:  Skin is warm, dry and intact. No rash noted. Psychiatric: Mood and affect are normal. Speech and behavior are normal. Patient exhibits appropriate insight and judgement.   ____________________________________________   LABS (all labs ordered are listed, but only abnormal results are displayed)  Labs Reviewed - No data to display ____________________________________________  EKG   ____________________________________________  RADIOLOGY Diamantina Providence Cuthriell, personally viewed and evaluated these images (plain radiographs) as part of my medical decision making, as well as reviewing the written report by the radiologist.  Dg Ankle Complete Right  Result Date: 07/26/2017 CLINICAL DATA:  61 y/o F; fall with ankle pain greater on the medial side. EXAM: RIGHT ANKLE - COMPLETE 3+ VIEW COMPARISON:  11/17/2015 right ankle radiographs FINDINGS: No acute fracture, dislocation, or joint effusion. Talar dome is intact. Ankle mortise is symmetric on these nonstress views. Stable degenerative/chronic posttraumatic changes of the medial malleolus. The moderate plantar calcaneal enthesophyte. Soft tissue swelling about the ankle joint greatest medially. IMPRESSION: No acute fracture or  dislocation identified. Stable chronic degenerative/traumatic changes of the medial malleolus. Moderate plantar calcaneal enthesophyte. Electronically Signed   By: Kristine Garbe M.D.   On: 07/26/2017 19:19    ____________________________________________    PROCEDURES  Procedure(s) performed:    Procedures    Medications - No data to display   ____________________________________________   INITIAL IMPRESSION / ASSESSMENT AND PLAN / ED COURSE  Pertinent labs & imaging results that were available during my care of the patient were reviewed by me and considered in my medical decision making (see chart for details).  Review of the [place] CSRS was performed in accordance  of the [place] prior to dispensing any controlled drugs.     Patient's diagnosis is consistent with right ankle sprain. Patient is given crutches and Ace bandage here in the emergency department.. Patient will be discharged home with prescriptions for anti-inflammatories for symptom control. Patient is to follow up with primary care as needed or otherwise directed. Patient is given ED precautions to return to the ED for any worsening or new symptoms.     ____________________________________________  FINAL CLINICAL IMPRESSION(S) / ED DIAGNOSES  Final diagnoses:  Sprain of anterior talofibular ligament of right ankle, initial encounter      NEW MEDICATIONS STARTED DURING THIS VISIT:  New Prescriptions   MELOXICAM (MOBIC) 15 MG TABLET    Take 1 tablet (15 mg total) by mouth daily.        This chart was dictated using voice recognition software/Dragon. Despite best efforts to proofread, errors can occur which can change the meaning. Any change was purely unintentional.    Darletta Moll, PA-C 07/26/17 1942    Merlyn Lot, MD 07/26/17 2142

## 2017-07-26 NOTE — ED Triage Notes (Signed)
Pt reports she stepped on a stepping stone and turned her right ankle about 1 hour PTA.  Pt states the pain is shooting up her leg. Pt states she has gout, but states the pain is different than her gout pain.

## 2017-08-22 ENCOUNTER — Other Ambulatory Visit: Payer: Self-pay | Admitting: Family Medicine

## 2017-08-22 DIAGNOSIS — E7800 Pure hypercholesterolemia, unspecified: Secondary | ICD-10-CM

## 2017-08-30 ENCOUNTER — Ambulatory Visit (INDEPENDENT_AMBULATORY_CARE_PROVIDER_SITE_OTHER): Payer: Medicare Other | Admitting: Family Medicine

## 2017-08-30 ENCOUNTER — Telehealth: Payer: Self-pay | Admitting: Family Medicine

## 2017-08-30 ENCOUNTER — Encounter: Payer: Self-pay | Admitting: Family Medicine

## 2017-08-30 VITALS — BP 152/88 | HR 66 | Temp 98.5°F | Resp 20 | Wt 275.0 lb

## 2017-08-30 DIAGNOSIS — R0600 Dyspnea, unspecified: Secondary | ICD-10-CM

## 2017-08-30 DIAGNOSIS — I6523 Occlusion and stenosis of bilateral carotid arteries: Secondary | ICD-10-CM | POA: Diagnosis not present

## 2017-08-30 DIAGNOSIS — Z23 Encounter for immunization: Secondary | ICD-10-CM

## 2017-08-30 DIAGNOSIS — E119 Type 2 diabetes mellitus without complications: Secondary | ICD-10-CM

## 2017-08-30 DIAGNOSIS — Z1211 Encounter for screening for malignant neoplasm of colon: Secondary | ICD-10-CM

## 2017-08-30 DIAGNOSIS — M109 Gout, unspecified: Secondary | ICD-10-CM

## 2017-08-30 DIAGNOSIS — T148XXA Other injury of unspecified body region, initial encounter: Secondary | ICD-10-CM

## 2017-08-30 DIAGNOSIS — R609 Edema, unspecified: Secondary | ICD-10-CM | POA: Diagnosis not present

## 2017-08-30 DIAGNOSIS — R0609 Other forms of dyspnea: Secondary | ICD-10-CM

## 2017-08-30 LAB — POCT GLYCOSYLATED HEMOGLOBIN (HGB A1C)
Est. average glucose Bld gHb Est-mCnc: 134
Hemoglobin A1C: 6.3

## 2017-08-30 NOTE — Telephone Encounter (Signed)
Order for cologuard faxed to [facility] °

## 2017-08-30 NOTE — Progress Notes (Signed)
Patient: Laura Nielsen Female    DOB: May 10, 1956   61 y.o.   MRN: [id number] Visit Date: 08/30/2017  Today's Provider: Lelon Huh, MD   Chief Complaint  Patient presents with  . Hypertension    follow up  . Diabetes    follow up   Subjective:    HPI  Hypertension, follow-up:  BP Readings from Last 3 Encounters:  07/26/17 (!) 166/86  07/04/17 (!) 175/60  06/11/17 (!) 142/68    She was last seen for hypertension 8 months ago.  BP at that visit was 110/70. Management since that visit includes no changes. She reports good compliance with treatment. She is not having side effects.  She is not exercising. She is adherent to low salt diet.   Outside blood pressures are not being checked. She is experiencing dyspnea, fatigue and lower extremity edema.  Patient denies irregular heart beat, near-syncope, orthopnea, palpitations, paroxysmal nocturnal dyspnea, syncope and tachypnea.   Cardiovascular risk factors include diabetes mellitus and hypertension.  Use of agents associated with hypertension: NSAIDS.     Weight trend: increasing steadily Wt Readings from Last 3 Encounters:  07/26/17 268 lb (121.6 kg)  07/04/17 270 lb (122.5 kg)  06/11/17 268 lb (121.6 kg)    Current diet: in general, an "unhealthy" diet  ------------------------------------------------------------------------  Diabetes Mellitus Type II, Follow-up:   Lab Results  Component Value Date   HGBA1C 6.5 05/01/2017   HGBA1C 6.1 01/03/2017   HGBA1C 6.3 09/25/2016    Last seen for diabetes 4 months ago.  Management since then includes no changes (diet controlled). She reports good compliance with treatment. She is not having side effects.  Current symptoms include paresthesia of the feet and have been stable. Home blood sugar records: home blood sugars not being checked  Episodes of hypoglycemia? no   Current Insulin Regimen: none Most Recent Eye Exam: >1 year ago Weight trend:  increasing steadily Prior visit with dietician: no Current diet: in general, an "unhealthy" diet Current exercise: none  Pertinent Labs:    Component Value Date/Time   CHOL 162 01/09/2017 0947   TRIG 124 01/09/2017 0947   HDL 35 (L) 01/09/2017 0947   LDLCALC 102 (H) 01/09/2017 0947   CREATININE 0.62 03/08/2017 1314   CREATININE 0.57 (L) 03/28/2013 0354    Wt Readings from Last 3 Encounters:  07/26/17 268 lb (121.6 kg)  07/04/17 270 lb (122.5 kg)  06/11/17 268 lb (121.6 kg)    ------------------------------------------------------------------------ Follow up of Chronic airflow limitation: Patient was last seen for this problem 4 months ago and was started on a maintenance inhaler. Patient reports good compliance with treatment, good tolerance and good symptom control. Patient states she still has dyspnea on exertion when walking long distances.   She has had more swelling in her ankles for the last month or so. She had a sprained ankle in August and put on meloxicam which she is still taking. She states headaches have gone away since starting meloxicam. She states she had been taking 2 ibuprofen every day before starting meloxicam. Was previously on bisoprolol-hctz in the past but stopped due to gout. She is now on allopurinol.     Allergies  Allergen Reactions  . Atorvastatin Other (See Comments)    Elevated blood sugar Elevated blood sugar  . Omeprazole Nausea And Vomiting     Current Outpatient Prescriptions:  .  albuterol (PROVENTIL HFA) 108 (90 Base) MCG/ACT inhaler, Inhale 2 puffs into the lungs every  6 (six) hours as needed for wheezing or shortness of breath., Disp: 18 g, Rfl: 11 .  allopurinol (ZYLOPRIM) 100 MG tablet, Take 1 tablet (100 mg total) by mouth daily., Disp: 30 tablet, Rfl: 12 .  aspirin 81 MG tablet, Take 81 mg by mouth daily. , Disp: , Rfl:  .  bisoprolol (ZEBETA) 10 MG tablet, Take 1 tablet (10 mg total) by mouth daily., Disp: 30 tablet, Rfl: 6 .   budesonide-formoterol (SYMBICORT) 160-4.5 MCG/ACT inhaler, Inhale 2 puffs into the lungs 2 (two) times daily., Disp: 1 Inhaler, Rfl: 11 .  Cholecalciferol (VITAMIN D3) 2000 units capsule, Take 1 capsule (2,000 Units total) by mouth daily., Disp: 30 capsule, Rfl: PRN .  clopidogrel (PLAVIX) 75 MG tablet, Take 1 tablet (75 mg total) by mouth daily., Disp: 90 tablet, Rfl: 2 .  Cyanocobalamin (RA VITAMIN B-12 TR) 1000 MCG TBCR, Take 2 tablets by mouth daily. , Disp: , Rfl:  .  Ibuprofen 200 MG CAPS, Take by mouth. , Disp: , Rfl:  .  lisinopril (PRINIVIL,ZESTRIL) 20 MG tablet, Take 1 tablet (20 mg total) by mouth daily., Disp: 90 tablet, Rfl: 3 .  loratadine (CLARITIN) 10 MG tablet, Take 10 mg by mouth daily as needed. , Disp: , Rfl:  .  lovastatin (MEVACOR) 40 MG tablet, Take 1 and 1/2 Tablets Daily., Disp: 45 tablet, Rfl: 11 .  meloxicam (MOBIC) 15 MG tablet, Take 1 tablet (15 mg total) by mouth daily., Disp: 30 tablet, Rfl: 0 .  vitamin C (ASCORBIC ACID) 500 MG tablet, Take 500 mg by mouth. , Disp: , Rfl:  .  amitriptyline (ELAVIL) 25 MG tablet, Take 1 tablet (25 mg total) by mouth at bedtime., Disp: 30 tablet, Rfl: 0 .  gabapentin (NEURONTIN) 300 MG capsule, Take 1 capsule (300 mg total) by mouth 3 (three) times daily., Disp: 90 capsule, Rfl: 0 .  oxyCODONE (ROXICODONE) 5 MG immediate release tablet, Take 1 tablet (5 mg total) by mouth every 6 (six) hours as needed for severe pain., Disp: 15 tablet, Rfl: 0  Review of Systems  Constitutional: Positive for fatigue. Negative for appetite change, chills and fever.  Respiratory: Positive for shortness of breath. Negative for chest tightness.   Cardiovascular: Positive for leg swelling (in ankles). Negative for chest pain and palpitations.  Gastrointestinal: Negative for abdominal pain, nausea and vomiting.  Musculoskeletal: Positive for back pain and neck pain.  Neurological: Positive for light-headedness and numbness. Negative for dizziness and  weakness.    Social History  Substance Use Topics  . Smoking status: Current Every Day Smoker    Packs/day: 1.00    Types: Cigarettes  . Smokeless tobacco: Never Used     Comment: previously smoked over 2 ppd  . Alcohol use Yes     Comment: Rarely   Objective:   BP (!) 152/88   Pulse 66   Temp 98.5 F (36.9 C) (Oral)   Resp 20   Wt 275 lb (124.7 kg)   SpO2 95% Comment: room air  BMI 41.21 kg/m     Physical Exam   General Appearance:    Alert, cooperative, no distress, obese  Eyes:    PERRL, conjunctiva/corneas clear, EOM's intact       Lungs:     Occasional inspiratory and expiratory wheezes, respirations unlabored  Heart:    Regular rate and rhythm. 1+ bipedal edema.   Neurologic:   Awake, alert, oriented x 3. No apparent focal neurological  defect.   Skin:   Small puncture wound   Results for orders placed or performed in visit on 08/30/17  POCT HgB A1C  Result Value Ref Range   Hemoglobin A1C 6.3    Est. average glucose Bld gHb Est-mCnc 134         Assessment & Plan:     1. Type 2 diabetes mellitus without complication, without long-term current use of insulin (HCC) Stable on prudent diet.  - POCT HgB A1C  2. Edema, unspecified type Worsening since stopping hctz due to gout. Consider restarting hctz and increasing allopurinol.  - COMPLETE METABOLIC PANEL WITH GFR - CBC - TSH  3. Dyspnea on exertion Exacerbating by hotter weather, consider h/o CAD need to rule out cardiac influence.  - Brain natriuretic peptide  4. Gout, unspecified cause, unspecified chronicity, unspecified site Well controlled since stopping hctz and starting allopurinol, but may need to be back on diuretic due to uncontrolled BP.  - Uric acid  5. Puncture wound  - Tdap vaccine greater than or equal to 7yo IM  6. Colon cancer screening  - Cologuard       Lelon Huh, MD  [facility]

## 2017-08-31 LAB — CBC
HCT: 43.9 % (ref 35.0–45.0)
Hemoglobin: 15.1 g/dL (ref 11.7–15.5)
MCH: 29.3 pg (ref 27.0–33.0)
MCHC: 34.4 g/dL (ref 32.0–36.0)
MCV: 85.2 fL (ref 80.0–100.0)
MPV: 10.5 fL (ref 7.5–12.5)
Platelets: 245 10*3/uL (ref 140–400)
RBC: 5.15 10*6/uL — ABNORMAL HIGH (ref 3.80–5.10)
RDW: 13.6 % (ref 11.0–15.0)
WBC: 9.6 10*3/uL (ref 3.8–10.8)

## 2017-08-31 LAB — COMPLETE METABOLIC PANEL WITH GFR
AG Ratio: 1.7 (calc) (ref 1.0–2.5)
ALT: 19 U/L (ref 6–29)
AST: 18 U/L (ref 10–35)
Albumin: 4 g/dL (ref 3.6–5.1)
Alkaline phosphatase (APISO): 88 U/L (ref 33–130)
BUN: 14 mg/dL (ref 7–25)
CO2: 22 mmol/L (ref 20–32)
Calcium: 9.2 mg/dL (ref 8.6–10.4)
Chloride: 110 mmol/L (ref 98–110)
Creat: 0.6 mg/dL (ref 0.50–0.99)
GFR, Est African American: 115 mL/min/{1.73_m2} (ref >=60)
GFR, Est Non African American: 99 mL/min/{1.73_m2} (ref >=60)
Globulin: 2.3 g/dL (calc) (ref 1.9–3.7)
Glucose, Bld: 117 mg/dL — ABNORMAL HIGH (ref 65–99)
Potassium: 3.9 mmol/L (ref 3.5–5.3)
Sodium: 142 mmol/L (ref 135–146)
Total Bilirubin: 0.6 mg/dL (ref 0.2–1.2)
Total Protein: 6.3 g/dL (ref 6.1–8.1)

## 2017-08-31 LAB — BRAIN NATRIURETIC PEPTIDE: Brain Natriuretic Peptide: 43 pg/mL (ref <100)

## 2017-08-31 LAB — TSH: TSH: 1.86 mIU/L (ref 0.40–4.50)

## 2017-08-31 LAB — URIC ACID: Uric Acid, Serum: 5.2 mg/dL (ref 2.5–7.0)

## 2017-09-03 ENCOUNTER — Telehealth: Payer: Self-pay

## 2017-09-03 MED ORDER — ALLOPURINOL 100 MG PO TABS: 200.0000 mg | ORAL_TABLET | Freq: Every day | ORAL | 5 refills | Status: DC

## 2017-09-03 MED ORDER — LISINOPRIL-HYDROCHLOROTHIAZIDE 20-12.5 MG PO TABS: 1.0000 | ORAL_TABLET | Freq: Every day | ORAL | 3 refills | Status: DC

## 2017-09-03 NOTE — Telephone Encounter (Signed)
Advised patient as below. Medications were sent into the pharmacy. Patient is currently out of town, and she will schedule appt when she gets home.

## 2017-09-03 NOTE — Telephone Encounter (Signed)
-----   Message from Birdie Sons, MD sent at 09/03/2017  8:03 AM EDT ----- Labs good, recommend changing lisinopril to lisinopril-hctz 20/12.5,  once a day #90 rf x 3  to help with swelling in increase allopurinol to 2 x 100mg  a day to keep uric acid level down, can send in rf for #60 rf x 5 of uric acid. Follow up for diabetes 4 months.

## 2017-09-17 DIAGNOSIS — Z1211 Encounter for screening for malignant neoplasm of colon: Secondary | ICD-10-CM | POA: Diagnosis not present

## 2017-09-17 DIAGNOSIS — Z1212 Encounter for screening for malignant neoplasm of rectum: Secondary | ICD-10-CM | POA: Diagnosis not present

## 2017-09-23 LAB — COLOGUARD: Cologuard: NEGATIVE

## 2017-10-04 DIAGNOSIS — I10 Essential (primary) hypertension: Secondary | ICD-10-CM | POA: Diagnosis not present

## 2017-10-04 DIAGNOSIS — I2510 Atherosclerotic heart disease of native coronary artery without angina pectoris: Secondary | ICD-10-CM | POA: Diagnosis not present

## 2017-10-04 DIAGNOSIS — R0602 Shortness of breath: Secondary | ICD-10-CM | POA: Diagnosis not present

## 2017-10-04 DIAGNOSIS — I714 Abdominal aortic aneurysm, without rupture: Secondary | ICD-10-CM | POA: Diagnosis not present

## 2017-10-04 DIAGNOSIS — R079 Chest pain, unspecified: Secondary | ICD-10-CM | POA: Diagnosis not present

## 2017-10-17 ENCOUNTER — Other Ambulatory Visit: Payer: Self-pay | Admitting: Family Medicine

## 2017-10-17 DIAGNOSIS — I10 Essential (primary) hypertension: Secondary | ICD-10-CM

## 2017-10-22 DIAGNOSIS — R0602 Shortness of breath: Secondary | ICD-10-CM | POA: Diagnosis not present

## 2017-10-22 DIAGNOSIS — R079 Chest pain, unspecified: Secondary | ICD-10-CM | POA: Diagnosis not present

## 2017-11-21 DIAGNOSIS — I2510 Atherosclerotic heart disease of native coronary artery without angina pectoris: Secondary | ICD-10-CM | POA: Diagnosis not present

## 2017-11-21 DIAGNOSIS — I10 Essential (primary) hypertension: Secondary | ICD-10-CM | POA: Diagnosis not present

## 2017-11-21 DIAGNOSIS — I714 Abdominal aortic aneurysm, without rupture: Secondary | ICD-10-CM | POA: Diagnosis not present

## 2017-11-21 DIAGNOSIS — E782 Mixed hyperlipidemia: Secondary | ICD-10-CM | POA: Diagnosis not present

## 2017-12-12 ENCOUNTER — Encounter: Payer: Self-pay | Admitting: Family Medicine

## 2017-12-12 ENCOUNTER — Ambulatory Visit (INDEPENDENT_AMBULATORY_CARE_PROVIDER_SITE_OTHER): Payer: Medicare Other | Admitting: Family Medicine

## 2017-12-12 ENCOUNTER — Telehealth: Payer: Self-pay | Admitting: *Deleted

## 2017-12-12 ENCOUNTER — Other Ambulatory Visit: Payer: Self-pay | Admitting: Family Medicine

## 2017-12-12 VITALS — BP 108/72 | HR 71 | Temp 98.6°F | Resp 16 | Wt 271.0 lb

## 2017-12-12 DIAGNOSIS — J449 Chronic obstructive pulmonary disease, unspecified: Secondary | ICD-10-CM | POA: Diagnosis not present

## 2017-12-12 DIAGNOSIS — M792 Neuralgia and neuritis, unspecified: Secondary | ICD-10-CM | POA: Diagnosis not present

## 2017-12-12 DIAGNOSIS — E7800 Pure hypercholesterolemia, unspecified: Secondary | ICD-10-CM

## 2017-12-12 DIAGNOSIS — I10 Essential (primary) hypertension: Secondary | ICD-10-CM

## 2017-12-12 DIAGNOSIS — F32A Depression, unspecified: Secondary | ICD-10-CM

## 2017-12-12 DIAGNOSIS — E119 Type 2 diabetes mellitus without complications: Secondary | ICD-10-CM

## 2017-12-12 DIAGNOSIS — F17210 Nicotine dependence, cigarettes, uncomplicated: Secondary | ICD-10-CM

## 2017-12-12 DIAGNOSIS — I2510 Atherosclerotic heart disease of native coronary artery without angina pectoris: Secondary | ICD-10-CM | POA: Diagnosis not present

## 2017-12-12 DIAGNOSIS — F329 Major depressive disorder, single episode, unspecified: Secondary | ICD-10-CM

## 2017-12-12 DIAGNOSIS — Z1231 Encounter for screening mammogram for malignant neoplasm of breast: Secondary | ICD-10-CM

## 2017-12-12 LAB — POCT GLYCOSYLATED HEMOGLOBIN (HGB A1C)
Est. average glucose Bld gHb Est-mCnc: 137
Hemoglobin A1C: 6.4

## 2017-12-12 LAB — POCT UA - MICROALBUMIN: Microalbumin Ur, POC: 20 mg/L

## 2017-12-12 MED ORDER — BUPROPION HCL ER (SR) 150 MG PO TB12: ORAL_TABLET | ORAL | 3 refills | Status: DC

## 2017-12-12 NOTE — Telephone Encounter (Signed)
Received referral for low dose lung cancer screening CT scan. Message left at phone number listed in EMR for patient to call me back to facilitate scheduling scan.  

## 2017-12-12 NOTE — Progress Notes (Signed)
Patient: Laura Nielsen Female    DOB: 1956-02-02   62 y.o.   MRN: [id number] Visit Date: 12/12/2017  Today's Provider: Lelon Huh, MD   Chief Complaint  Patient presents with  . Follow-up   Subjective:    HPI  Hypertension, follow-up:  BP Readings from Last 3 Encounters:  08/30/17 (!) 152/88  07/26/17 (!) 166/86  07/04/17 (!) 175/60    She was last seen for hypertension 4 months ago.  BP at that visit was 152/88. Management since that visit includes changing Lisinopril to Lisinopril-HCTZ to help with swelling. She reports good compliance with treatment. She is not having side effects.  She is not exercising. She is not adherent to low salt diet.   Outside blood pressures are not being checked. She is experiencing none.  Patient denies chest pain, chest pressure/discomfort, claudication, dyspnea, exertional chest pressure/discomfort, fatigue, irregular heart beat, lower extremity edema, near-syncope, orthopnea, palpitations, paroxysmal nocturnal dyspnea, syncope and tachypnea.   Cardiovascular risk factors include diabetes mellitus and hypertension.  Use of agents associated with hypertension: NSAIDS.     Weight trend: fluctuating a bit Wt Readings from Last 3 Encounters:  08/30/17 275 lb (124.7 kg)  07/26/17 268 lb (121.6 kg)  07/04/17 270 lb (122.5 kg)    Current diet: well balanced  ------------------------------------------------------------------------  Diabetes Mellitus Type II, Follow-up:   Lab Results  Component Value Date   HGBA1C 6.3 08/30/2017   HGBA1C 6.5 05/01/2017   HGBA1C 6.1 01/03/2017    Last seen for diabetes 4 months ago.  Management since then includes no changes. She reports good compliance with treatment. She is not having side effects.  Current symptoms include none and have been stable. Home blood sugar records: are not being checked  Episodes of hypoglycemia? no   Current Insulin Regimen: none Most Recent Eye Exam: > 1  year ago Weight trend: fluctuating a bit Prior visit with dietician: no Current diet: well balanced Current exercise: none  Pertinent Labs:    Component Value Date/Time   CHOL 162 01/09/2017 0947   TRIG 124 01/09/2017 0947   HDL 35 (L) 01/09/2017 0947   LDLCALC 102 (H) 01/09/2017 0947   CREATININE 0.60 08/30/2017 0920    Wt Readings from Last 3 Encounters:  08/30/17 275 lb (124.7 kg)  07/26/17 268 lb (121.6 kg)  07/04/17 270 lb (122.5 kg)    ------------------------------------------------------------------------ Follow up of Gout:  Patient was last seen for this problem 4 months ago. Changes made since the last visit includes increasing Allopurinol to 200mg  daily. Patient reports good compliance with treatment, good tolerance and fair symptom control.   She also reports that she has felt depressed for several months. Never feels like doing anything. Is more emotional than usual. Tears up easily.  She also complains area of mid back just left of midline that feels like it is burning or stinging. No specific triggers. Nothing relieves pain. No known injury, has not seen any rashes.   Allergies  Allergen Reactions  . Atorvastatin Other (See Comments)    Elevated blood sugar Elevated blood sugar  . Omeprazole Nausea And Vomiting     Current Outpatient Medications:  .  albuterol (PROVENTIL HFA) 108 (90 Base) MCG/ACT inhaler, Inhale 2 puffs into the lungs every 6 (six) hours as needed for wheezing or shortness of breath., Disp: 18 g, Rfl: 11 .  allopurinol (ZYLOPRIM) 100 MG tablet, Take 2 tablets (200 mg total) by mouth daily., Disp: 60 tablet, Rfl:  5 .  aspirin 81 MG tablet, Take 81 mg by mouth daily. , Disp: , Rfl:  .  bisoprolol (ZEBETA) 10 MG tablet, Take 1 tablet (10 mg total) by mouth daily., Disp: 30 tablet, Rfl: 5 .  budesonide-formoterol (SYMBICORT) 160-4.5 MCG/ACT inhaler, Inhale 2 puffs into the lungs 2 (two) times daily., Disp: 1 Inhaler, Rfl: 11 .   Cholecalciferol (VITAMIN D3) 2000 units capsule, Take 1 capsule (2,000 Units total) by mouth daily., Disp: 30 capsule, Rfl: PRN .  clopidogrel (PLAVIX) 75 MG tablet, Take 1 tablet (75 mg total) by mouth daily., Disp: 90 tablet, Rfl: 2 .  Cyanocobalamin (RA VITAMIN B-12 TR) 1000 MCG TBCR, Take 2 tablets by mouth daily. , Disp: , Rfl:  .  Ibuprofen 200 MG CAPS, Take by mouth. , Disp: , Rfl:  .  lisinopril-hydrochlorothiazide (ZESTORETIC) 20-12.5 MG tablet, Take 1 tablet by mouth daily., Disp: 90 tablet, Rfl: 3 .  loratadine (CLARITIN) 10 MG tablet, Take 10 mg by mouth daily as needed. , Disp: , Rfl:  .  lovastatin (MEVACOR) 40 MG tablet, Take 1 and 1/2 Tablets Daily., Disp: 45 tablet, Rfl: 11 .  meloxicam (MOBIC) 15 MG tablet, Take 1 tablet (15 mg total) by mouth daily., Disp: 30 tablet, Rfl: 0 .  vitamin C (ASCORBIC ACID) 500 MG tablet, Take 500 mg by mouth. , Disp: , Rfl:  .  amitriptyline (ELAVIL) 25 MG tablet, Take 1 tablet (25 mg total) by mouth at bedtime., Disp: 30 tablet, Rfl: 0 .  gabapentin (NEURONTIN) 300 MG capsule, Take 1 capsule (300 mg total) by mouth 3 (three) times daily., Disp: 90 capsule, Rfl: 0 .  oxyCODONE (ROXICODONE) 5 MG immediate release tablet, Take 1 tablet (5 mg total) by mouth every 6 (six) hours as needed for severe pain., Disp: 15 tablet, Rfl: 0  Review of Systems  Constitutional: Negative for appetite change, chills, fatigue and fever.  Respiratory: Negative for chest tightness and shortness of breath.   Cardiovascular: Negative for chest pain and palpitations.  Gastrointestinal: Negative for abdominal pain, nausea and vomiting.  Musculoskeletal: Positive for back pain.  Neurological: Negative for dizziness and weakness.  Psychiatric/Behavioral: Positive for dysphoric mood.    Social History   Tobacco Use  . Smoking status: Current Every Day Smoker    Packs/day: 1.00    Types: Cigarettes  . Smokeless tobacco: Never Used  . Tobacco comment: previously smoked  over 2 ppd  Substance Use Topics  . Alcohol use: Yes    Comment: Rarely   Objective:    Vitals:   12/12/17 0825 12/12/17 0828 12/12/17 0919  BP: 102/60 (!) 92/58 108/72  Pulse: 71    Resp: 16    Temp: 98.6 F (37 C)    TempSrc: Oral    SpO2: 95%    Weight: 271 lb (122.9 kg)       Physical Exam  General Appearance:    Alert, cooperative, no distress, obese  Eyes:    PERRL, conjunctiva/corneas clear, EOM's intact       Lungs:     Clear to auscultation bilaterally, respirations unlabored  Heart:    Regular rate and rhythm  Neurologic:   Awake, alert, oriented x 3. No apparent focal neurological           defect.   MS:   Tender to touch area about the size of hand just left of midline mid-back. No lesions. No gross deformities.   Depression screen [facility] 2/9 12/12/2017 07/04/2017 04/26/2017  Decreased  Interest 2 0 0  Down, Depressed, Hopeless 1 0 0  PHQ - 2 Score 3 0 0  Altered sleeping 2 - -  Tired, decreased energy 2 - -  Change in appetite 2 - -  Feeling bad or failure about yourself  1 - -  Trouble concentrating 0 - -  Moving slowly or fidgety/restless 0 - -  Suicidal thoughts 0 - -  PHQ-9 Score 10 - -  Difficult doing work/chores Not difficult at all - -       Assessment & Plan:     1. Type 2 diabetes mellitus without complication, without long-term current use of insulin (HCC) Well controlled.  Continue current medications.   - POCT HgB A1C - Ambulatory referral to Ophthalmology - POCT UA - Microalbumin  2. Benign essential HTN Stable. Continue current medications.    3. CAD in native artery Asymptomatic. Compliant with medication.  Continue aggressive risk factor modification.   - Lipid panel  4. CAFL (chronic airflow limitation) (HCC) Doing well current inhalers.   5. Smoking greater than 30 pack years Encouraged smoking cessation.  - Ambulatory Referral for Lung Cancer Scre  6. Hypercholesteremia Discussed benefit of high intensity statin versus  lovastatin. She doesn't want to take atorvastatin because increased her blood sugar in the past. Consider change to rosuvastatin after reviewing labs.   7. Depression, unspecified depression type Try bupropion. Counseled regarding increasing exercise.   8. Neurogenic pain (mid back)  Try OTC Zostrix  Reviewed records to see if she still needs pap screening since she has remote history of vaginal hysterectomy. Had been getting regular pap tests when she was seeing Dr.Smith, but I could not find any mention of whether or not she still had cervix. Recommend that she schedule pelvis exam/breast/CPE and visually confirm presence of cervix to determine if pap is needed.    Addressed extensive list of chronic and acute medical problems today requiring extensive time in counseling and coordination of care.  Over half of this 45 minute visit were spent in counseling and coordinating care of multiple medical problems.   She refused flu vaccine      Lelon Huh, MD  [facility]

## 2017-12-12 NOTE — Patient Instructions (Addendum)
   Please call the [facility] [phone]) to schedule a routine screening mammogram.    Try OTC Zostrix (capsaicin) cream to painful area of back

## 2017-12-13 ENCOUNTER — Telehealth: Payer: Self-pay | Admitting: *Deleted

## 2017-12-13 DIAGNOSIS — Z122 Encounter for screening for malignant neoplasm of respiratory organs: Secondary | ICD-10-CM

## 2017-12-13 DIAGNOSIS — Z87891 Personal history of nicotine dependence: Secondary | ICD-10-CM

## 2017-12-13 LAB — LIPID PANEL
Chol/HDL Ratio: 4.8 ratio — ABNORMAL HIGH (ref 0.0–4.4)
Cholesterol, Total: 143 mg/dL (ref 100–199)
HDL: 30 mg/dL — ABNORMAL LOW (ref >39)
LDL Calculated: 87 mg/dL (ref 0–99)
Triglycerides: 131 mg/dL (ref 0–149)
VLDL Cholesterol Cal: 26 mg/dL (ref 5–40)

## 2017-12-13 MED ORDER — ROSUVASTATIN CALCIUM 20 MG PO TABS
20.0000 mg | ORAL_TABLET | Freq: Every day | ORAL | 5 refills | Status: DC
Start: 2017-12-13 — End: 2018-06-04

## 2017-12-13 NOTE — Telephone Encounter (Signed)
Received referral for initial lung cancer screening scan. Contacted patient and obtained smoking history,(current, 66 pack year) as well as answering questions related to screening process. Patient denies signs of lung cancer such as weight loss or hemoptysis. Patient denies comorbidity that would prevent curative treatment if lung cancer were found. Patient is scheduled for shared decision making visit and CT scan on 01/01/18.

## 2017-12-13 NOTE — Telephone Encounter (Signed)
-----   Message from Birdie Sons, MD sent at 12/13/2017  7:56 AM EST ----- HDL cholesterol is too low at 30, needs to be at least 40 for to prevent artery blockages from reforming. need to change from lovastatin to rosuvastatin 20mg  once a day , #30, rf x 5. Call if any problems or side effects of medications.

## 2017-12-13 NOTE — Telephone Encounter (Signed)
Patient was notified of results. Rx sent to pharmacy.

## 2017-12-31 ENCOUNTER — Encounter: Payer: Self-pay | Admitting: Nurse Practitioner

## 2018-01-01 ENCOUNTER — Ambulatory Visit
Admission: RE | Admit: 2018-01-01 | Discharge: 2018-01-01 | Disposition: A | Discharge status: Home / Self Care | Payer: Medicare Other | Source: Ambulatory Visit | Attending: Nurse Practitioner | Admitting: Nurse Practitioner

## 2018-01-01 ENCOUNTER — Inpatient Hospital Stay: Payer: Medicare Other | Attending: Nurse Practitioner | Admitting: Nurse Practitioner

## 2018-01-01 DIAGNOSIS — Z122 Encounter for screening for malignant neoplasm of respiratory organs: Secondary | ICD-10-CM

## 2018-01-01 DIAGNOSIS — I700 Atherosclerosis of aorta: Secondary | ICD-10-CM | POA: Insufficient documentation

## 2018-01-01 DIAGNOSIS — F17210 Nicotine dependence, cigarettes, uncomplicated: Secondary | ICD-10-CM | POA: Diagnosis not present

## 2018-01-01 DIAGNOSIS — J439 Emphysema, unspecified: Secondary | ICD-10-CM | POA: Diagnosis not present

## 2018-01-01 DIAGNOSIS — Z87891 Personal history of nicotine dependence: Secondary | ICD-10-CM | POA: Diagnosis not present

## 2018-01-01 NOTE — Progress Notes (Signed)
In accordance with CMS guidelines, patient has met eligibility criteria including age, absence of signs or symptoms of lung cancer.  Social History   Tobacco Use  . Smoking status: Current Every Day Smoker    Packs/day: 1.50    Years: 44.00    Pack years: 66.00    Types: Cigarettes  . Smokeless tobacco: Never Used  . Tobacco comment: previously smoked over 2 ppd  Substance Use Topics  . Alcohol use: Yes    Comment: Rarely  . Drug use: No     A shared decision-making session was conducted prior to the performance of CT scan. This includes one or more decision aids, includes benefits and harms of screening, follow-up diagnostic testing, over-diagnosis, false positive rate, and total radiation exposure.  Counseling on the importance of adherence to annual lung cancer LDCT screening, impact of co-morbidities, and ability or willingness to undergo diagnosis and treatment is imperative for compliance of the program.  Counseling on the importance of continued smoking cessation for former smokers; the importance of smoking cessation for current smokers, and information about tobacco cessation interventions have been given to patient including [place] and [facility] quit [place] programs.  Written order for lung cancer screening with LDCT has been given to the patient and any and all questions have been answered to the best of my abilities.   Yearly follow up will be coordinated by Burgess Estelle, Thoracic Navigator.  Beckey Rutter, DNP, AGNP-C [place] [facility] [phone] (office) 01/01/18 1:32 PM

## 2018-01-03 ENCOUNTER — Ambulatory Visit
Admission: RE | Admit: 2018-01-03 | Discharge: 2018-01-03 | Disposition: A | Discharge status: Home / Self Care | Payer: Medicare Other | Source: Ambulatory Visit | Attending: Family Medicine | Admitting: Family Medicine

## 2018-01-03 DIAGNOSIS — Z1231 Encounter for screening mammogram for malignant neoplasm of breast: Secondary | ICD-10-CM | POA: Diagnosis not present

## 2018-01-04 ENCOUNTER — Encounter: Payer: Self-pay | Admitting: *Deleted

## 2018-01-09 DIAGNOSIS — E119 Type 2 diabetes mellitus without complications: Secondary | ICD-10-CM | POA: Diagnosis not present

## 2018-01-09 LAB — HM DIABETES EYE EXAM

## 2018-01-18 ENCOUNTER — Encounter: Payer: Self-pay | Admitting: Family Medicine

## 2018-01-21 ENCOUNTER — Telehealth: Payer: Self-pay | Admitting: Family Medicine

## 2018-01-21 NOTE — Telephone Encounter (Signed)
Patient wanted to know if she should try taking 1/2 tablet of bupropion to see if this helps with her nausea or try another medication? Please advise?

## 2018-01-21 NOTE — Telephone Encounter (Signed)
Patient has been advised. KW 

## 2018-01-21 NOTE — Telephone Encounter (Signed)
Yes, she can try taking 1/2 tablet daily

## 2018-01-21 NOTE — Telephone Encounter (Signed)
Pt stated that she is taking buPROPion (WELLBUTRIN SR) 150 MG 12 hr tablet twice a day but it makes her very nauseous. Pt stated that she has tried taking it just before she eats, after she has ate, and while she is eating but it still makes her very nauseous and pt stated that it last all day. Pt stated that she hasn't smoked a cigarette since Friday 01/18/18 evening. Pharmacy: [facility]. Please advise. Thanks TNP

## 2018-01-30 ENCOUNTER — Ambulatory Visit (INDEPENDENT_AMBULATORY_CARE_PROVIDER_SITE_OTHER): Payer: Medicare Other | Admitting: Physician Assistant

## 2018-01-30 ENCOUNTER — Encounter: Payer: Self-pay | Admitting: Physician Assistant

## 2018-01-30 VITALS — BP 116/82 | HR 61 | Temp 97.4°F | Ht 68.0 in | Wt 274.0 lb

## 2018-01-30 DIAGNOSIS — R8290 Unspecified abnormal findings in urine: Secondary | ICD-10-CM

## 2018-01-30 DIAGNOSIS — Z1239 Encounter for other screening for malignant neoplasm of breast: Secondary | ICD-10-CM

## 2018-01-30 DIAGNOSIS — Z1231 Encounter for screening mammogram for malignant neoplasm of breast: Secondary | ICD-10-CM

## 2018-01-30 DIAGNOSIS — Z0000 Encounter for general adult medical examination without abnormal findings: Secondary | ICD-10-CM | POA: Diagnosis not present

## 2018-01-30 DIAGNOSIS — Z1211 Encounter for screening for malignant neoplasm of colon: Secondary | ICD-10-CM | POA: Diagnosis not present

## 2018-01-30 DIAGNOSIS — Z124 Encounter for screening for malignant neoplasm of cervix: Secondary | ICD-10-CM | POA: Diagnosis not present

## 2018-01-30 LAB — POCT URINALYSIS DIPSTICK
Appearance: ABNORMAL
Bilirubin, UA: NEGATIVE
Glucose, UA: NEGATIVE
Ketones, UA: NEGATIVE
Leukocytes, UA: NEGATIVE
Nitrite, UA: NEGATIVE
Protein, UA: NEGATIVE
Spec Grav, UA: 1.02 (ref 1.010–1.025)
Urobilinogen, UA: 0.2 E.U./dL
pH, UA: 6 (ref 5.0–8.0)

## 2018-01-30 NOTE — Patient Instructions (Signed)
Health Maintenance for Postmenopausal Women Menopause is a normal process in which your reproductive ability comes to an end. This process happens gradually over a span of months to years, usually between the ages of 22 and 9. Menopause is complete when you have missed 12 consecutive menstrual periods. It is important to talk with your health care provider about some of the most common conditions that affect postmenopausal women, such as heart disease, cancer, and bone loss (osteoporosis). Adopting a healthy lifestyle and getting preventive care can help to promote your health and wellness. Those actions can also lower your chances of developing some of these common conditions. What should I know about menopause? During menopause, you may experience a number of symptoms, such as:  Moderate-to-severe hot flashes.  Night sweats.  Decrease in sex drive.  Mood swings.  Headaches.  Tiredness.  Irritability.  Memory problems.  Insomnia.  Choosing to treat or not to treat menopausal changes is an individual decision that you make with your health care provider. What should I know about hormone replacement therapy and supplements? Hormone therapy products are effective for treating symptoms that are associated with menopause, such as hot flashes and night sweats. Hormone replacement carries certain risks, especially as you become older. If you are thinking about using estrogen or estrogen with progestin treatments, discuss the benefits and risks with your health care provider. What should I know about heart disease and stroke? Heart disease, heart attack, and stroke become more likely as you age. This may be due, in part, to the hormonal changes that your body experiences during menopause. These can affect how your body processes dietary fats, triglycerides, and cholesterol. Heart attack and stroke are both medical emergencies. There are many things that you can do to help prevent heart disease  and stroke:  Have your blood pressure checked at least every 1-2 years. High blood pressure causes heart disease and increases the risk of stroke.  If you are 53-22 years old, ask your health care provider if you should take aspirin to prevent a heart attack or a stroke.  Do not use any tobacco products, including cigarettes, chewing tobacco, or electronic cigarettes. If you need help quitting, ask your health care provider.  It is important to eat a healthy diet and maintain a healthy weight. ? Be sure to include plenty of vegetables, fruits, low-fat dairy products, and lean protein. ? Avoid eating foods that are high in solid fats, added sugars, or salt (sodium).  Get regular exercise. This is one of the most important things that you can do for your health. ? Try to exercise for at least 150 minutes each week. The type of exercise that you do should increase your heart rate and make you sweat. This is known as moderate-intensity exercise. ? Try to do strengthening exercises at least twice each week. Do these in addition to the moderate-intensity exercise.  Know your numbers.Ask your health care provider to check your cholesterol and your blood glucose. Continue to have your blood tested as directed by your health care provider.  What should I know about cancer screening? There are several types of cancer. Take the following steps to reduce your risk and to catch any cancer development as early as possible. Breast Cancer  Practice breast self-awareness. ? This means understanding how your breasts normally appear and feel. ? It also means doing regular breast self-exams. Let your health care provider know about any changes, no matter how small.  If you are 40  or older, have a clinician do a breast exam (clinical breast exam or CBE) every year. Depending on your age, family history, and medical history, it may be recommended that you also have a yearly breast X-ray (mammogram).  If you  have a family history of breast cancer, talk with your health care provider about genetic screening.  If you are at high risk for breast cancer, talk with your health care provider about having an MRI and a mammogram every year.  Breast cancer (BRCA) gene test is recommended for women who have family members with BRCA-related cancers. Results of the assessment will determine the need for genetic counseling and BRCA1 and for BRCA2 testing. BRCA-related cancers include these types: ? Breast. This occurs in males or females. ? Ovarian. ? Tubal. This may also be called fallopian tube cancer. ? Cancer of the abdominal or pelvic lining (peritoneal cancer). ? Prostate. ? Pancreatic.  Cervical, Uterine, and Ovarian Cancer Your health care provider may recommend that you be screened regularly for cancer of the pelvic organs. These include your ovaries, uterus, and vagina. This screening involves a pelvic exam, which includes checking for microscopic changes to the surface of your cervix (Pap test).  For women ages 21-65, health care providers may recommend a pelvic exam and a Pap test every three years. For women ages 79-65, they may recommend the Pap test and pelvic exam, combined with testing for human papilloma virus (HPV), every five years. Some types of HPV increase your risk of cervical cancer. Testing for HPV may also be done on women of any age who have unclear Pap test results.  Other health care providers may not recommend any screening for nonpregnant women who are considered low risk for pelvic cancer and have no symptoms. Ask your health care provider if a screening pelvic exam is right for you.  If you have had past treatment for cervical cancer or a condition that could lead to cancer, you need Pap tests and screening for cancer for at least 20 years after your treatment. If Pap tests have been discontinued for you, your risk factors (such as having a new sexual partner) need to be  reassessed to determine if you should start having screenings again. Some women have medical problems that increase the chance of getting cervical cancer. In these cases, your health care provider may recommend that you have screening and Pap tests more often.  If you have a family history of uterine cancer or ovarian cancer, talk with your health care provider about genetic screening.  If you have vaginal bleeding after reaching menopause, tell your health care provider.  There are currently no reliable tests available to screen for ovarian cancer.  Lung Cancer Lung cancer screening is recommended for adults 69-62 years old who are at high risk for lung cancer because of a history of smoking. A yearly low-dose CT scan of the lungs is recommended if you:  Currently smoke.  Have a history of at least 30 pack-years of smoking and you currently smoke or have quit within the past 15 years. A pack-year is smoking an average of one pack of cigarettes per day for one year.  Yearly screening should:  Continue until it has been 15 years since you quit.  Stop if you develop a health problem that would prevent you from having lung cancer treatment.  Colorectal Cancer  This type of cancer can be detected and can often be prevented.  Routine colorectal cancer screening usually begins at  age 42 and continues through age 45.  If you have risk factors for colon cancer, your health care provider may recommend that you be screened at an earlier age.  If you have a family history of colorectal cancer, talk with your health care provider about genetic screening.  Your health care provider may also recommend using home test kits to check for hidden blood in your stool.  A small camera at the end of a tube can be used to examine your colon directly (sigmoidoscopy or colonoscopy). This is done to check for the earliest forms of colorectal cancer.  Direct examination of the colon should be repeated every  5-10 years until age 71. However, if early forms of precancerous polyps or small growths are found or if you have a family history or genetic risk for colorectal cancer, you may need to be screened more often.  Skin Cancer  Check your skin from head to toe regularly.  Monitor any moles. Be sure to tell your health care provider: ? About any new moles or changes in moles, especially if there is a change in a mole's shape or color. ? If you have a mole that is larger than the size of a pencil eraser.  If any of your family members has a history of skin cancer, especially at a young age, talk with your health care provider about genetic screening.  Always use sunscreen. Apply sunscreen liberally and repeatedly throughout the day.  Whenever you are outside, protect yourself by wearing long sleeves, pants, a wide-brimmed hat, and sunglasses.  What should I know about osteoporosis? Osteoporosis is a condition in which bone destruction happens more quickly than new bone creation. After menopause, you may be at an increased risk for osteoporosis. To help prevent osteoporosis or the bone fractures that can happen because of osteoporosis, the following is recommended:  If you are 46-71 years old, get at least 1,000 mg of calcium and at least 600 mg of vitamin D per day.  If you are older than age 55 but younger than age 65, get at least 1,200 mg of calcium and at least 600 mg of vitamin D per day.  If you are older than age 54, get at least 1,200 mg of calcium and at least 800 mg of vitamin D per day.  Smoking and excessive alcohol intake increase the risk of osteoporosis. Eat foods that are rich in calcium and vitamin D, and do weight-bearing exercises several times each week as directed by your health care provider. What should I know about how menopause affects my mental health? Depression may occur at any age, but it is more common as you become older. Common symptoms of depression  include:  Low or sad mood.  Changes in sleep patterns.  Changes in appetite or eating patterns.  Feeling an overall lack of motivation or enjoyment of activities that you previously enjoyed.  Frequent crying spells.  Talk with your health care provider if you think that you are experiencing depression. What should I know about immunizations? It is important that you get and maintain your immunizations. These include:  Tetanus, diphtheria, and pertussis (Tdap) booster vaccine.  Influenza every year before the flu season begins.  Pneumonia vaccine.  Shingles vaccine.  Your health care provider may also recommend other immunizations. This information is not intended to replace advice given to you by your health care provider. Make sure you discuss any questions you have with your health care provider. Document Released: 01/12/2006  Document Revised: 06/09/2016 Document Reviewed: 08/24/2015 Elsevier Interactive Patient Education  2018 [employer]  

## 2018-01-30 NOTE — Progress Notes (Signed)
Patient: Laura Nielsen, Female    DOB: June 10, 1956, 62 y.o.   MRN: [id number] Visit Date: 01/30/2018  Today's Provider: Mar Daring, PA-C   Chief Complaint  Patient presents with  . Medicare Wellness   Subjective:   Initial preventative physical exam Laura Nielsen is a 62 y.o. female who presents today for her Initial Preventative Physical Exam. She feels fairly well. She reports exercising none due to back pain. She reports she is sleeping well with CPAP.  Mammogram: 01/03/2018  Review of Systems  Constitutional: Positive for fatigue.  HENT: Positive for dental problem.   Eyes: Negative.   Respiratory: Positive for apnea and shortness of breath.   Cardiovascular: Positive for leg swelling.  Gastrointestinal: Positive for constipation and nausea.  Endocrine: Positive for heat intolerance and polydipsia.  Genitourinary: Positive for urgency.  Musculoskeletal: Positive for arthralgias, back pain, gait problem, joint swelling, myalgias, neck pain and neck stiffness.  Skin: Negative.   Allergic/Immunologic: Positive for environmental allergies.  Neurological: Positive for dizziness, light-headedness and headaches.  Hematological: Bruises/bleeds easily.  Psychiatric/Behavioral: Negative.     Social History   Socioeconomic History  . Marital status: Divorced    Spouse name: Not on file  . Number of children: Not on file  . Years of education: H/S  . Highest education level: Not on file  Social Needs  . Financial resource strain: Not on file  . Food insecurity - worry: Not on file  . Food insecurity - inability: Not on file  . Transportation needs - medical: Not on file  . Transportation needs - non-medical: Not on file  Occupational History  . Occupation: Disabled  Tobacco Use  . Smoking status: Former Smoker    Packs/day: 1.50    Years: 44.00    Pack years: 66.00    Types: Cigarettes    Last attempt to quit: 01/18/2018    Years since quitting: 0.0  .  Smokeless tobacco: Never Used  . Tobacco comment: previously smoked over 2 ppd  Substance and Sexual Activity  . Alcohol use: Yes    Comment: Rarely  . Drug use: No  . Sexual activity: Not on file  Other Topics Concern  . Not on file  Social History Narrative  . Not on file    Past Medical History:  Diagnosis Date  . Allergy   . Arthritis   . Asthma   . Depression   . Diabetes mellitus without complication ([place])   . Emphysema of lung ([place])   . GERD (gastroesophageal reflux disease)   . Hyperlipidemia   . Hypertension   . Osteoporosis   . Tobacco abuse counseling      Patient Active Problem List   Diagnosis Date Noted  . Abnormal MRI, lumbar spine (03/28/2017) 07/04/2017  . Abnormal MRI, cervical spine (03/28/2017) 07/04/2017  . Lumbar facet syndrome (Bilateral) (L>R) 04/26/2017  . Lumbar facet hypertrophy (multilevel) (Bilateral) 04/26/2017  . Long term current use of anticoagulant therapy 04/26/2017  . Neurogenic pain 04/26/2017  . Musculoskeletal pain 04/26/2017  . Chronic pain syndrome 03/08/2017  . Long term prescription opiate use 03/08/2017  . Opiate use 03/08/2017  . Chronic low back pain (Location of Primary Source of Pain) (Bilateral) (L>R) 03/08/2017  . Chronic neck pain (Location of Secondary source of pain) (Bilateral) (R>L) 03/08/2017  . Cervicogenic headache (Right) 03/08/2017  . Chronic upper back pain (Location of Tertiary source of pain) (Bilateral) (R>L) 03/08/2017  . Chronic hip pain (Bilateral) (L>R) 03/08/2017  .  Osteoarthritis of hip (Bilateral) (L>R) 03/08/2017  . Cervical central spinal stenosis 03/08/2017  . Cervical spondylosis with radiculopathy (Right) (C5) 03/08/2017  . Lumbar spondylosis 03/08/2017  . Atherosclerosis of native arteries of extremity with intermittent claudication ([place]) 02/22/2017  . Gout 02/16/2016  . Ankle pain 11/17/2015  . Arthritis 05/14/2015  . Carotid artery narrowing 05/14/2015  . Claudication ([place]) 05/14/2015    . CAFL (chronic airflow limitation) ([place]) 05/14/2015  . Clinical depression 05/14/2015  . Acid reflux 05/14/2015  . Urinary incontinence 05/14/2015  . Pins and needles sensation 05/14/2015  . Obstructive apnea 05/14/2015  . Adiposity 05/14/2015  . History of abnormal cervical Papanicolaou smear 05/14/2015  . Peripheral blood vessel disorder ([place]) 05/14/2015  . B12 deficiency 05/14/2015  . Vitamin D insufficiency 05/14/2015  . AAA (abdominal aortic aneurysm) without rupture ([place]) 05/26/2014  . Aortic heart valve narrowing 01/31/2013  . Malaise and fatigue 09/26/2012  . CAD in native artery 03/17/2009  . Diabetes mellitus, type 2 ([place]) 02/05/2009  . Hypercholesteremia 06/24/2008  . Allergic rhinitis 10/25/2007  . Airway hyperreactivity 10/25/2007  . Smoking greater than 30 pack years 10/25/2007  . Narrowing of intervertebral disc space 10/25/2007  . Benign essential HTN 10/25/2007  . Arthritis, degenerative 10/25/2007    Past Surgical History:  Procedure Laterality Date  . Cardiac catheterization  3/210   70-80% stenosis RCA stent placed. started on Plavix  . [place]  . TUBAL LIGATION    . VAGINAL HYSTERECTOMY     Menometrorrhagia. Excessive bleeding. Unknown if cervix removed.   . vascular stent  03/28/2011   Dr. Delana Meyer, [facility]; Infrarenal    Her family history includes Alcohol abuse in her father and sister; Breast cancer (age of onset: 18) in her sister; Cancer (age of onset: 64) in her sister; Coronary artery disease in her mother; Coronary artery disease (age of onset: 51) in her sister; Depression in her father; Heart attack in her mother and sister; Heart attack (age of onset: 80) in her father; Hyperlipidemia in her sister; Hypertension in her father, mother, and sister.      Current Outpatient Medications:  .  albuterol (PROVENTIL HFA) 108 (90 Base) MCG/ACT inhaler, Inhale 2 puffs into the lungs every 6 (six) hours as needed for wheezing or shortness  of breath., Disp: 18 g, Rfl: 11 .  allopurinol (ZYLOPRIM) 100 MG tablet, Take 2 tablets (200 mg total) by mouth daily., Disp: 60 tablet, Rfl: 5 .  aspirin 81 MG tablet, Take 81 mg by mouth daily. , Disp: , Rfl:  .  bisoprolol (ZEBETA) 10 MG tablet, Take 1 tablet (10 mg total) by mouth daily., Disp: 30 tablet, Rfl: 5 .  budesonide-formoterol (SYMBICORT) 160-4.5 MCG/ACT inhaler, Inhale 2 puffs into the lungs 2 (two) times daily., Disp: 1 Inhaler, Rfl: 11 .  Cholecalciferol (VITAMIN D3) 2000 units capsule, Take 1 capsule (2,000 Units total) by mouth daily., Disp: 30 capsule, Rfl: PRN .  clopidogrel (PLAVIX) 75 MG tablet, Take 1 tablet (75 mg total) by mouth daily., Disp: 90 tablet, Rfl: 2 .  Cyanocobalamin (RA VITAMIN B-12 TR) 1000 MCG TBCR, Take 2 tablets by mouth daily. , Disp: , Rfl:  .  Ibuprofen 200 MG CAPS, Take by mouth. , Disp: , Rfl:  .  lisinopril-hydrochlorothiazide (ZESTORETIC) 20-12.5 MG tablet, Take 1 tablet by mouth daily., Disp: 90 tablet, Rfl: 3 .  loratadine (CLARITIN) 10 MG tablet, Take 10 mg by mouth daily as needed. , Disp: , Rfl:  .  meloxicam (MOBIC) 15 MG tablet, Take 1 tablet (15 mg total) by mouth daily., Disp: 30 tablet, Rfl: 0 .  rosuvastatin (CRESTOR) 20 MG tablet, Take 1 tablet (20 mg total) by mouth daily., Disp: 30 tablet, Rfl: 5 .  vitamin C (ASCORBIC ACID) 500 MG tablet, Take 500 mg by mouth. , Disp: , Rfl:  .  amitriptyline (ELAVIL) 25 MG tablet, Take 1 tablet (25 mg total) by mouth at bedtime., Disp: 30 tablet, Rfl: 0 .  buPROPion (WELLBUTRIN SR) 150 MG 12 hr tablet, 1 tablet daily for 3 days, then 1 tablet twice daily. \ (Patient not taking: Reported on 01/30/2018), Disp: 60 tablet, Rfl: 3 .  gabapentin (NEURONTIN) 300 MG capsule, Take 1 capsule (300 mg total) by mouth 3 (three) times daily., Disp: 90 capsule, Rfl: 0 .  oxyCODONE (ROXICODONE) 5 MG immediate release tablet, Take 1 tablet (5 mg total) by mouth every 6 (six) hours as needed for severe pain., Disp: 15  tablet, Rfl: 0   Patient Care Team: Birdie Sons, MD as PCP - General (Family Medicine) Teodoro Spray, MD as Consulting Physician (Cardiology) Schnier, Dolores Lory, MD (Vascular Surgery)      Objective:   Vitals: BP 116/82 (BP Location: Right Arm, Patient Position: Sitting, Cuff Size: Normal)   Pulse 61   Temp (!) 97.4 F (36.3 C) (Oral)   Ht 5\' 8"  (1.727 m)   Wt 274 lb (124.3 kg)   SpO2 97%   BMI 41.66 kg/m   Physical Exam  Constitutional: She is oriented to person, place, and time. She appears well-developed and well-nourished. No distress.  HENT:  Head: Normocephalic and atraumatic.  Right Ear: Hearing, tympanic membrane, external ear and ear canal normal.  Left Ear: Hearing, tympanic membrane, external ear and ear canal normal.  Nose: Nose normal.  Mouth/Throat: Uvula is midline, oropharynx is clear and moist and mucous membranes are normal. No oropharyngeal exudate.  Eyes: Conjunctivae and EOM are normal. Pupils are equal, round, and reactive to light. Right eye exhibits no discharge. Left eye exhibits no discharge. No scleral icterus.  Neck: Normal range of motion. Neck supple. No JVD present. Carotid bruit is not present. No tracheal deviation present. No thyromegaly present.  Cardiovascular: Normal rate, regular rhythm, normal heart sounds and intact distal pulses. Exam reveals no gallop and no friction rub.  No murmur heard. Pulmonary/Chest: Effort normal and breath sounds normal. No respiratory distress. She has no wheezes. She has no rales. She exhibits no tenderness. Right breast exhibits no inverted nipple, no mass, no nipple discharge, no skin change and no tenderness. Left breast exhibits no inverted nipple, no mass, no nipple discharge, no skin change and no tenderness. Breasts are symmetrical.  Abdominal: Soft. Bowel sounds are normal. She exhibits no distension and no mass. There is no tenderness. There is no rebound and no guarding. Hernia confirmed negative in  the right inguinal area and confirmed negative in the left inguinal area.  Genitourinary: Rectum normal, vagina normal and uterus normal. No breast swelling, tenderness, discharge or bleeding. Pelvic exam was performed with patient supine. There is no rash, tenderness, lesion or injury on the right labia. There is no rash, tenderness, lesion or injury on the left labia. Cervix exhibits no motion tenderness, no discharge and no friability. Right adnexum displays no mass, no tenderness and no fullness. Left adnexum displays no mass, no tenderness and no fullness. No erythema, tenderness or bleeding in the vagina. No signs of injury around the vagina. No  vaginal discharge found.  Musculoskeletal: Normal range of motion. She exhibits no edema or tenderness.  Lymphadenopathy:    She has no cervical adenopathy.       Right: No inguinal adenopathy present.       Left: No inguinal adenopathy present.  Neurological: She is alert and oriented to person, place, and time. She has normal reflexes. No cranial nerve deficit. Coordination normal.  Skin: Skin is warm and dry. No rash noted. She is not diaphoretic.  Psychiatric: She has a normal mood and affect. Her behavior is normal. Judgment and thought content normal.  Vitals reviewed.    No exam data present  Activities of Daily Living No flowsheet data found.  Fall Risk Assessment Fall Risk  07/04/2017 04/26/2017 03/08/2017  Falls in the past year? No No No  Risk for fall due to : - Impaired balance/gait;Impaired mobility -     Depression Screen PHQ 2/9 Scores 12/12/2017 07/04/2017 04/26/2017 03/08/2017  PHQ - 2 Score 3 0 0 0  PHQ- 9 Score 10 - - -    Cognitive Testing - 6-CIT  Correct? Score   What year is it? yes 0 0 or 4  What month is it? yes 0 0 or 3  Memorize:    Pia Mau,  43,  [address]  [place],      What time is it? (within 1 hour) yes 0 0 or 3  Count backwards from 20 yes 0 0, 2, or 4  Name the months of the year yes 0 0, 2, or 4    Repeat name & address above yes 0 0, 2, 4, 6, 8, or 10       TOTAL SCORE  0/28   Interpretation:  Normal  Normal (0-7) Abnormal (8-28)      Assessment & Plan:     Initial Preventative Physical Exam  Reviewed patient's Family Medical History Reviewed and updated list of patient's medical providers Assessment of cognitive impairment was done Assessed patient's functional ability Established a written schedule for health screening [place] Completed and Reviewed  Exercise Activities and Dietary recommendations Goals    None      Immunization History  Administered Date(s) Administered  . Pneumococcal Polysaccharide-23 09/25/2016  . Tdap 08/30/2017    Health Maintenance  Topic Date Due  . FOOT EXAM  12/14/1966  . HIV Screening  12/15/1971  . PAP SMEAR  03/15/2015  . INFLUENZA VACCINE  03/03/2018 (Originally 07/04/2017)  . HEMOGLOBIN A1C  06/11/2018  . OPHTHALMOLOGY EXAM  01/09/2019  . MAMMOGRAM  01/04/2020  . Fecal DNA (Cologuard)  09/17/2020  . PNEUMOCOCCAL POLYSACCHARIDE VACCINE (2) 09/25/2021  . TETANUS/TDAP  08/31/2027  . Hepatitis C Screening  Completed     Discussed health benefits of physical activity, and encouraged her to engage in regular exercise appropriate for her age and condition.    1. Medicare annual wellness visit, initial EKG shows NSR rate of 63 with low voltage. Patient has known COPD. Screenings and vaccinations reviewed and patient is up to date.  - EKG 12-Lead  2. Breast cancer screening Mammogram was done on 01/03/18 and BiRads:1. Breast exam today normal. Patient does not perform self breast exams. Self breast exams demonstrated today.  3. Cervical cancer screening Pap collected today. Will send as below and f/u pending results. - Pap IG and HPV (high risk) DNA detection  4. Colon cancer screening Cologuard done in 2018 and negative.  5. Cloudy urine UA normal. Suspect dehydration  from nausea with wellbutrin.  Advised to pus fluids.  - POCT Urinalysis Dipstick  ------------------------------------------------------------------------------------------------------------    Mar Daring, PA-C  [facility]

## 2018-02-02 LAB — PAP IG AND HPV HIGH-RISK
HPV, high-risk: NEGATIVE
PAP Smear Comment: 0

## 2018-02-04 ENCOUNTER — Telehealth: Payer: Self-pay

## 2018-02-04 NOTE — Telephone Encounter (Signed)
-----   Message from Mar Daring, PA-C sent at 02/04/2018  1:16 PM EST ----- Pap is normal, HPV negative.  Will repeat in 3-5 years if desired.

## 2018-02-04 NOTE — Telephone Encounter (Signed)
Patient advised as directed below.  Thanks,  -Joseline 

## 2018-02-05 ENCOUNTER — Ambulatory Visit: Payer: Medicare Other | Attending: Nurse Practitioner | Admitting: Nurse Practitioner

## 2018-02-05 ENCOUNTER — Encounter: Payer: Self-pay | Admitting: Nurse Practitioner

## 2018-02-05 ENCOUNTER — Other Ambulatory Visit: Payer: Self-pay

## 2018-02-05 VITALS — BP 141/77 | HR 58 | Temp 97.6°F | Resp 18 | Ht 68.0 in | Wt 274.0 lb

## 2018-02-05 DIAGNOSIS — E538 Deficiency of other specified B group vitamins: Secondary | ICD-10-CM | POA: Insufficient documentation

## 2018-02-05 DIAGNOSIS — E1140 Type 2 diabetes mellitus with diabetic neuropathy, unspecified: Secondary | ICD-10-CM | POA: Diagnosis not present

## 2018-02-05 DIAGNOSIS — E785 Hyperlipidemia, unspecified: Secondary | ICD-10-CM | POA: Diagnosis not present

## 2018-02-05 DIAGNOSIS — E1151 Type 2 diabetes mellitus with diabetic peripheral angiopathy without gangrene: Secondary | ICD-10-CM | POA: Insufficient documentation

## 2018-02-05 DIAGNOSIS — Z79891 Long term (current) use of opiate analgesic: Secondary | ICD-10-CM | POA: Insufficient documentation

## 2018-02-05 DIAGNOSIS — M542 Cervicalgia: Secondary | ICD-10-CM | POA: Insufficient documentation

## 2018-02-05 DIAGNOSIS — M8938 Hypertrophy of bone, other site: Secondary | ICD-10-CM | POA: Diagnosis not present

## 2018-02-05 DIAGNOSIS — M545 Low back pain: Secondary | ICD-10-CM | POA: Diagnosis not present

## 2018-02-05 DIAGNOSIS — M160 Bilateral primary osteoarthritis of hip: Secondary | ICD-10-CM | POA: Insufficient documentation

## 2018-02-05 DIAGNOSIS — Z5181 Encounter for therapeutic drug level monitoring: Secondary | ICD-10-CM | POA: Insufficient documentation

## 2018-02-05 DIAGNOSIS — M62830 Muscle spasm of back: Secondary | ICD-10-CM | POA: Insufficient documentation

## 2018-02-05 DIAGNOSIS — F329 Major depressive disorder, single episode, unspecified: Secondary | ICD-10-CM | POA: Diagnosis not present

## 2018-02-05 DIAGNOSIS — M810 Age-related osteoporosis without current pathological fracture: Secondary | ICD-10-CM | POA: Diagnosis not present

## 2018-02-05 DIAGNOSIS — E559 Vitamin D deficiency, unspecified: Secondary | ICD-10-CM | POA: Insufficient documentation

## 2018-02-05 DIAGNOSIS — G4733 Obstructive sleep apnea (adult) (pediatric): Secondary | ICD-10-CM | POA: Diagnosis not present

## 2018-02-05 DIAGNOSIS — M792 Neuralgia and neuritis, unspecified: Secondary | ICD-10-CM | POA: Diagnosis not present

## 2018-02-05 DIAGNOSIS — K219 Gastro-esophageal reflux disease without esophagitis: Secondary | ICD-10-CM | POA: Insufficient documentation

## 2018-02-05 DIAGNOSIS — M47896 Other spondylosis, lumbar region: Secondary | ICD-10-CM | POA: Insufficient documentation

## 2018-02-05 DIAGNOSIS — I10 Essential (primary) hypertension: Secondary | ICD-10-CM | POA: Diagnosis not present

## 2018-02-05 DIAGNOSIS — Z6841 Body Mass Index (BMI) 40.0 and over, adult: Secondary | ICD-10-CM | POA: Insufficient documentation

## 2018-02-05 DIAGNOSIS — M4722 Other spondylosis with radiculopathy, cervical region: Secondary | ICD-10-CM | POA: Diagnosis not present

## 2018-02-05 DIAGNOSIS — I714 Abdominal aortic aneurysm, without rupture: Secondary | ICD-10-CM | POA: Insufficient documentation

## 2018-02-05 DIAGNOSIS — G894 Chronic pain syndrome: Secondary | ICD-10-CM | POA: Diagnosis not present

## 2018-02-05 DIAGNOSIS — Z7951 Long term (current) use of inhaled steroids: Secondary | ICD-10-CM | POA: Insufficient documentation

## 2018-02-05 DIAGNOSIS — G629 Polyneuropathy, unspecified: Secondary | ICD-10-CM | POA: Diagnosis not present

## 2018-02-05 DIAGNOSIS — M47816 Spondylosis without myelopathy or radiculopathy, lumbar region: Secondary | ICD-10-CM

## 2018-02-05 DIAGNOSIS — Z87891 Personal history of nicotine dependence: Secondary | ICD-10-CM | POA: Insufficient documentation

## 2018-02-05 DIAGNOSIS — E7800 Pure hypercholesterolemia, unspecified: Secondary | ICD-10-CM | POA: Diagnosis not present

## 2018-02-05 DIAGNOSIS — Z7982 Long term (current) use of aspirin: Secondary | ICD-10-CM | POA: Insufficient documentation

## 2018-02-05 DIAGNOSIS — J45909 Unspecified asthma, uncomplicated: Secondary | ICD-10-CM | POA: Insufficient documentation

## 2018-02-05 DIAGNOSIS — I2510 Atherosclerotic heart disease of native coronary artery without angina pectoris: Secondary | ICD-10-CM | POA: Insufficient documentation

## 2018-02-05 DIAGNOSIS — J439 Emphysema, unspecified: Secondary | ICD-10-CM | POA: Diagnosis not present

## 2018-02-05 DIAGNOSIS — Z7902 Long term (current) use of antithrombotics/antiplatelets: Secondary | ICD-10-CM | POA: Insufficient documentation

## 2018-02-05 DIAGNOSIS — E669 Obesity, unspecified: Secondary | ICD-10-CM | POA: Insufficient documentation

## 2018-02-05 DIAGNOSIS — Z79899 Other long term (current) drug therapy: Secondary | ICD-10-CM | POA: Insufficient documentation

## 2018-02-05 MED ORDER — OXYCODONE HCL 5 MG PO TABS: 5.0000 mg | ORAL_TABLET | Freq: Four times a day (QID) | ORAL | 0 refills | Status: DC | PRN

## 2018-02-05 NOTE — Patient Instructions (Addendum)
____________________________________________________________________________________________ Laura Nielsen have been given Rx for Oxycodone to last until 03/07/2018   Medication Rules  Applies to: All patients receiving prescriptions (written or electronic).  Pharmacy of record: Pharmacy where electronic prescriptions will be sent. If written prescriptions are taken to a different pharmacy, please inform the nursing staff. The pharmacy listed in the electronic medical record should be the one where you would like electronic prescriptions to be sent.  Prescription refills: Only during scheduled appointments. Applies to both, written and electronic prescriptions.  NOTE: The following applies primarily to controlled substances (Opioid* Pain Medications).   Patient's responsibilities: 1. Pain Pills: Bring all pain pills to every appointment (except for procedure appointments). 2. Pill Bottles: Bring pills in original pharmacy bottle. Always bring newest bottle. Bring bottle, even if empty. 3. Medication refills: You are responsible for knowing and keeping track of what medications you need refilled. The day before your appointment, write a list of all prescriptions that need to be refilled. Bring that list to your appointment and give it to the admitting nurse. Prescriptions will be written only during appointments. If you forget a medication, it will not be "Called in", "Faxed", or "electronically sent". You will need to get another appointment to get these prescribed. 4. Prescription Accuracy: You are responsible for carefully inspecting your prescriptions before leaving our office. Have the discharge nurse carefully go over each prescription with you, before taking them home. Make sure that your name is accurately spelled, that your address is correct. Check the name and dose of your medication to make sure it is accurate. Check the number of pills, and the written instructions to make sure they are clear and  accurate. Make sure that you are given enough medication to last until your next medication refill appointment. 5. Taking Medication: Take medication as prescribed. Never take more pills than instructed. Never take medication more frequently than prescribed. Taking less pills or less frequently is permitted and encouraged, when it comes to controlled substances (written prescriptions).  6. Inform other Doctors: Always inform, all of your healthcare providers, of all the medications you take. 7. Pain Medication from other Providers: You are not allowed to accept any additional pain medication from any other Doctor or Healthcare provider. There are two exceptions to this rule. (see below) In the event that you require additional pain medication, you are responsible for notifying us, as stated below. 8. Medication Agreement: You are responsible for carefully reading and following our Medication Agreement. This must be signed before receiving any prescriptions from our practice. Safely store a copy of your signed Agreement. Violations to the Agreement will result in no further prescriptions. (Additional copies of our Medication Agreement are available upon request.) 9. Laws, Rules, & Regulations: All patients are expected to follow all Federal and [employer], [employer], Rules, [employer]. Ignorance of the Laws does not constitute a valid excuse. The use of any illegal substances is prohibited. 10. Adopted CDC guidelines & recommendations: Target dosing levels will be at or below 60 MME/day. Use of benzodiazepines** is not recommended.  Exceptions: There are only two exceptions to the rule of not receiving pain medications from other Healthcare Providers. 1. Exception #1 (Emergencies): In the event of an emergency (i.e.: accident requiring emergency care), you are allowed to receive additional pain medication. However, you are responsible for: As soon as you are able, call our office [phone], at any  time of the day or night, and leave a message stating your name, the date and  nature of the emergency, and the name and dose of the medication prescribed. In the event that your call is answered by a member of our staff, make sure to document and save the date, time, and the name of the person that took your information.  2. Exception #2 (Planned Surgery): In the event that you are scheduled by another doctor or dentist to have any type of surgery or procedure, you are allowed (for a period no longer than 30 days), to receive additional pain medication, for the acute post-op pain. However, in this case, you are responsible for picking up a copy of our "Post-op Pain Management for Surgeons" handout, and giving it to your surgeon or dentist. This document is available at our office, and does not require an appointment to obtain it. Simply go to our office during business hours (Monday-Thursday from 8:00 AM to 4:00 PM) (Friday 8:00 AM to 12:00 Noon) or if you have a scheduled appointment with [place], prior to your surgery, and ask for it by name. In addition, you will need to provide [place] with your name, name of your surgeon, type of surgery, and date of procedure or surgery.  *Opioid medications include: morphine, codeine, oxycodone, oxymorphone, hydrocodone, hydromorphone, meperidine, tramadol, tapentadol, buprenorphine, fentanyl, methadone. **Benzodiazepine medications include: diazepam (Valium), alprazolam (Xanax), clonazepam (Klonopine), lorazepam (Ativan), clorazepate (Tranxene), chlordiazepoxide (Librium), estazolam (Prosom), oxazepam (Serax), temazepam (Restoril), triazolam (Halcion) (Last updated: 01/31/2018) ____________________________________________________________________________________________    BMI Assessment: Estimated body mass index is 41.66 kg/m as calculated from the following:   Height as of this encounter: 5\' 8"  (1.727 m).   Weight as of this encounter: 274 lb (124.3 kg).  BMI  interpretation table: BMI level Category Range association with higher incidence of chronic pain  <18 kg/m2 Underweight   18.5-24.9 kg/m2 Ideal body weight   25-29.9 kg/m2 Overweight Increased incidence by 20%  30-34.9 kg/m2 Obese (Class I) Increased incidence by 68%  35-39.9 kg/m2 Severe obesity (Class II) Increased incidence by 136%  >40 kg/m2 Extreme obesity (Class III) Increased incidence by 254%   BMI Readings from Last 4 Encounters:  02/05/18 41.66 kg/m  01/30/18 41.66 kg/m  01/01/18 40.16 kg/m  12/12/17 40.61 kg/m   Wt Readings from Last 4 Encounters:  02/05/18 274 lb (124.3 kg)  01/30/18 274 lb (124.3 kg)  01/01/18 268 lb (121.6 kg)  12/12/17 271 lb (122.9 kg)

## 2018-02-05 NOTE — Progress Notes (Signed)
Nursing Pain Medication Assessment:  Safety precautions to be maintained throughout the outpatient stay will include: orient to surroundings, keep bed in low position, maintain call bell within reach at all times, provide assistance with transfer out of bed and ambulation.  Medication Inspection Compliance: Pill count conducted under aseptic conditions, in front of the patient. Neither the pills nor the bottle was removed from the patient's sight at any time. Once count was completed pills were immediately returned to the patient in their original bottle.  Medication: Oxycodone IR Pill/Patch Count: 3 of 15 pills remain Pill/Patch Appearance: Markings consistent with prescribed medication Bottle Appearance: Standard pharmacy container. Clearly labeled. Filled Date: 05/25 / 2019 Last Medication intake:  Has not taken in about 2 weeks

## 2018-02-05 NOTE — Progress Notes (Signed)
Patient's Name: Laura Nielsen  MRN: [id number]  Referring Provider: Birdie Sons, MD  DOB: August 24, 1956  PCP: Birdie Sons, MD  DOS: 02/05/2018  Note by: Vevelyn Francois NP  Service setting: Ambulatory outpatient  Specialty: Interventional Pain Management  Location: [facility] (AMB) Pain Management Facility    Patient type: Established    Primary Reason(s) for Visit: Encounter for prescription drug management. (Level of risk: moderate)  CC: Neck Pain and Back Pain (lower)  HPI  Laura Nielsen is a 62 y.o. year old, female patient, who comes today for a medication management evaluation. She has Allergic rhinitis; Aortic heart valve narrowing; Arthritis; Airway hyperreactivity; CAD in native artery; Carotid artery narrowing; Claudication ([place]); CAFL (chronic airflow limitation) ([place]); Smoking greater than 30 pack years; Narrowing of intervertebral disc space; Clinical depression; Diabetes mellitus, type 2 ([place]); Acid reflux; Hypercholesteremia; Benign essential HTN; Urinary incontinence; Malaise and fatigue; Pins and needles sensation; Obstructive apnea; Adiposity; Arthritis, degenerative; History of abnormal cervical Papanicolaou smear; Peripheral blood vessel disorder ([place]); B12 deficiency; Vitamin D insufficiency; AAA (abdominal aortic aneurysm) without rupture ([place]); Ankle pain; Gout; Atherosclerosis of native arteries of extremity with intermittent claudication ([place]); Chronic pain syndrome; Long term prescription opiate use; Opiate use; Chronic low back pain (Location of Primary Source of Pain) (Bilateral) (L>R); Chronic neck pain (Location of Secondary source of pain) (Bilateral) (R>L); Cervicogenic headache (Right); Chronic upper back pain (Location of Tertiary source of pain) (Bilateral) (R>L); Chronic hip pain (Bilateral) (L>R); Osteoarthritis of hip (Bilateral) (L>R); Cervical central spinal stenosis; Cervical spondylosis with radiculopathy (Right) (C5); Lumbar spondylosis; Lumbar facet syndrome (Bilateral)  (L>R); Lumbar facet hypertrophy (multilevel) (Bilateral); Long term current use of anticoagulant therapy; Neurogenic pain; Musculoskeletal pain; Abnormal MRI, lumbar spine (03/28/2017); and Abnormal MRI, cervical spine (03/28/2017) on their problem list. Her primarily concern today is the Neck Pain and Back Pain (lower)  Pain Assessment: Location: Lower Back(neck pain) Radiating: neck pain radiates into back of head Onset: More than a month ago Duration: Chronic pain Quality: Shooting(stiff) Severity: 2 (lower back)/10 (self-reported pain score)  Note: Reported level is compatible with observation.                           Timing: Intermittent Modifying factors: ice, heat  Laura Nielsen was last scheduled for an appointment on Visit date not found for medication management. During today's appointment we reviewed Laura Nielsen's chronic pain status, as well as her outpatient medication regimen. She admits that her pain is stable. She has noticed sensitivity in her right middle back. She has been evaluted by PCP and was told it maybe a pinched nerve .She states that it comes and goes. She thought it may have been shingles. She admits that she has not been vaccinated. She denies a real concern at this time. She denies using the amitriptyline. She admits that she does continue to use her gabapentin and does have refills and this is refilled with her primary care provider.  The patient  reports that she does not use drugs. Her body mass index is 41.66 kg/m.  Further details on both, my assessment(s), as well as the proposed treatment plan, please see below.  Controlled Substance Pharmacotherapy Assessment REMS (Risk Evaluation and Mitigation Strategy)  Analgesic: Oxycodone IR 5 mg 1 to twice a week. MME/day:'15mg'$ /day   Landis Martins, RN  02/05/2018 10:03 AM  Sign at close encounter Nursing Pain Medication Assessment:  Safety precautions to be maintained throughout the outpatient  stay will include:  orient to surroundings, keep bed in low position, maintain call bell within reach at all times, provide assistance with transfer out of bed and ambulation.  Medication Inspection Compliance: Pill count conducted under aseptic conditions, in front of the patient. Neither the pills nor the bottle was removed from the patient's sight at any time. Once count was completed pills were immediately returned to the patient in their original bottle.  Medication: Oxycodone IR Pill/Patch Count: 3 of 15 pills remain Pill/Patch Appearance: Markings consistent with prescribed medication Bottle Appearance: Standard pharmacy container. Clearly labeled. Filled Date: 05/25 / 2019 Last Medication intake:  Has not taken in about 2 weeks   Pharmacokinetics: Liberation and absorption (onset of action): WNL Distribution (time to peak effect): WNL Metabolism and excretion (duration of action): WNL         Pharmacodynamics: Desired effects: Analgesia: Laura Nielsen reports >50% benefit. Functional ability: Patient reports that medication allows her to accomplish basic ADLs Clinically meaningful improvement in function (CMIF): Sustained CMIF goals met Perceived effectiveness: Described as relatively effective, allowing for increase in activities of daily living (ADL) Undesirable effects: Side-effects or Adverse reactions: None reported Monitoring: [place] PMP: Online review of the past 25-monthperiod conducted. Compliant with practice rules and regulations Last UDS on record: Summary  Date Value Ref Range Status  03/08/2017 FINAL  Final    Comment:    ==================================================================== TOXASSURE COMP DRUG ANALYSIS,UR ==================================================================== Test                             Result       Flag       Units Drug Present and Declared for Prescription Verification   Gabapentin                     PRESENT      EXPECTED   Amitriptyline                   PRESENT      EXPECTED   Nortriptyline                  PRESENT      EXPECTED    Nortriptyline is an expected metabolite of amitriptyline.   Salicylate                     PRESENT      EXPECTED Drug Absent but Declared for Prescription Verification   Oxycodone                      Not Detected UNEXPECTED ng/mg creat   Bupropion                      Not Detected UNEXPECTED   Acetaminophen                  Not Detected UNEXPECTED    Acetaminophen, as indicated in the declared medication list, is    not always detected even when used as directed.   Ibuprofen                      Not Detected UNEXPECTED    Ibuprofen, as indicated in the declared medication list, is not    always detected even when used as directed. ==================================================================== Test  Result    Flag   Units      Ref Range   Creatinine              160              mg/dL      >=20 ==================================================================== Declared Medications:  The flagging and interpretation on this report are based on the  following declared medications.  Unexpected results may arise from  inaccuracies in the declared medications.  **Note: The testing scope of this panel includes these medications:  Amitriptyline (Elavil)  Bupropion (Wellbutrin)  Gabapentin  Oxycodone (Oxycodone Acetaminophen)  **Note: The testing scope of this panel does not include small to  moderate amounts of these reported medications:  Acetaminophen (Oxycodone Acetaminophen)  Aspirin  Ibuprofen  **Note: The testing scope of this panel does not include following  reported medications:  Albuterol  Allopurinol (Zyloprim)  Bisoprolol (Zebeta)  Clopidogrel (Plavix)  Cyanocobalamin  Lisinopril  Loratadine (Claritin)  Lovastatin (Mevacor)  Triamcinolone (Kenalog)  Vitamin C  Vitamin D ==================================================================== For clinical  consultation, please call [phone]. ====================================================================    UDS interpretation: Compliant          Medication Assessment Form: Reviewed. Patient indicates being compliant with therapy Treatment compliance: Compliant Risk Assessment Profile: Aberrant behavior: See prior evaluations. None observed or detected today Comorbid factors increasing risk of overdose: See prior notes. No additional risks detected today Risk of substance use disorder (SUD): Low  ORT Scoring interpretation table:  Score <3 = Low Risk for SUD  Score between 4-7 = Moderate Risk for SUD  Score >8 = High Risk for Opioid Abuse   Risk Mitigation Strategies:  Patient Counseling: Covered Patient-Prescriber Agreement (PPA): Present and active  Notification to other healthcare providers: Done  Pharmacologic Plan: No change in therapy, at this time.             Laboratory Chemistry  Inflammation Markers (CRP: Acute Phase) (ESR: Chronic Phase) Lab Results  Component Value Date   CRP <0.8 03/08/2017   ESRSEDRATE 5 03/08/2017                         Rheumatology Markers Lab Results  Component Value Date   LABURIC 5.2 08/30/2017                Renal Function Markers Lab Results  Component Value Date   BUN 14 08/30/2017   CREATININE 0.60 08/30/2017   GFRAA 115 08/30/2017   GFRNONAA 99 08/30/2017                 Hepatic Function Markers Lab Results  Component Value Date   AST 18 08/30/2017   ALT 19 08/30/2017   ALBUMIN 3.9 03/08/2017   ALKPHOS 99 03/08/2017   LIPASE 140 08/26/2012                 Electrolytes Lab Results  Component Value Date   NA 142 08/30/2017   K 3.9 08/30/2017   CL 110 08/30/2017   CALCIUM 9.2 08/30/2017   MG 1.8 03/08/2017   PHOS 4.0 05/18/2016                        Neuropathy Markers Lab Results  Component Value Date   VITAMINB12 473 03/08/2017   HGBA1C 6.4 12/12/2017                 Bone Pathology  Markers Lab Results  Component Value Date   VD25OH 21.9 (L) 01/09/2017   25OHVITD1 24 (L) 03/08/2017   25OHVITD2 8.2 03/08/2017   25OHVITD3 16 03/08/2017                         Coagulation Parameters Lab Results  Component Value Date   INR 0.9 04/26/2017   LABPROT 9.9 04/26/2017   APTT 28.2 03/28/2013   PLT 245 08/30/2017                 Cardiovascular Markers Lab Results  Component Value Date   BNP 43 08/30/2017   HGB 15.1 08/30/2017   HCT 43.9 08/30/2017                 CA Markers No results found for: CEA, CA125, LABCA2               Note: Lab results reviewed.  Recent Diagnostic Imaging Results  MM DIGITAL SCREENING BILATERAL CLINICAL DATA:  Screening.  EXAM: DIGITAL SCREENING BILATERAL MAMMOGRAM WITH CAD  COMPARISON:  Previous exam(s).  ACR Breast Density Category a: The breast tissue is almost entirely fatty.  FINDINGS: There are no findings suspicious for malignancy. Images were processed with CAD.  IMPRESSION: No mammographic evidence of malignancy. A result letter of this screening mammogram will be mailed directly to the patient.  RECOMMENDATION: Screening mammogram in one year. (Code:[id number]-B-01Y)  BI-RADS CATEGORY  1: Negative.  Electronically Signed   By: Evangeline Dakin M.D.   On: 01/03/2018 11:17  Complexity Note: Imaging results reviewed. Results shared with Ms. Hoshino, using Layman's terms.                         Meds   Current Outpatient Medications:  .  albuterol (PROVENTIL HFA) 108 (90 Base) MCG/ACT inhaler, Inhale 2 puffs into the lungs every 6 (six) hours as needed for wheezing or shortness of breath., Disp: 18 g, Rfl: 11 .  allopurinol (ZYLOPRIM) 100 MG tablet, Take 2 tablets (200 mg total) by mouth daily., Disp: 60 tablet, Rfl: 5 .  aspirin 81 MG tablet, Take 81 mg by mouth daily. , Disp: , Rfl:  .  bisoprolol (ZEBETA) 10 MG tablet, Take 1 tablet (10 mg total) by mouth daily., Disp: 30 tablet, Rfl: 5 .  budesonide-formoterol  (SYMBICORT) 160-4.5 MCG/ACT inhaler, Inhale 2 puffs into the lungs 2 (two) times daily., Disp: 1 Inhaler, Rfl: 11 .  Cholecalciferol (VITAMIN D3) 2000 units capsule, Take 1 capsule (2,000 Units total) by mouth daily., Disp: 30 capsule, Rfl: PRN .  clopidogrel (PLAVIX) 75 MG tablet, Take 1 tablet (75 mg total) by mouth daily., Disp: 90 tablet, Rfl: 2 .  Cyanocobalamin (RA VITAMIN B-12 TR) 1000 MCG TBCR, Take 2 tablets by mouth daily. , Disp: , Rfl:  .  Ibuprofen 200 MG CAPS, Take by mouth. , Disp: , Rfl:  .  lisinopril-hydrochlorothiazide (ZESTORETIC) 20-12.5 MG tablet, Take 1 tablet by mouth daily., Disp: 90 tablet, Rfl: 3 .  loratadine (CLARITIN) 10 MG tablet, Take 10 mg by mouth daily as needed. , Disp: , Rfl:  .  rosuvastatin (CRESTOR) 20 MG tablet, Take 1 tablet (20 mg total) by mouth daily., Disp: 30 tablet, Rfl: 5 .  vitamin C (ASCORBIC ACID) 500 MG tablet, Take 500 mg by mouth. , Disp: , Rfl:  .  amitriptyline (ELAVIL) 25 MG tablet, Take 1 tablet (25 mg total) by mouth at bedtime., Disp: 30 tablet, Rfl:  0 .  gabapentin (NEURONTIN) 300 MG capsule, Take 1 capsule (300 mg total) by mouth 3 (three) times daily., Disp: 90 capsule, Rfl: 0 .  oxyCODONE (ROXICODONE) 5 MG immediate release tablet, Take 1 tablet (5 mg total) by mouth every 6 (six) hours as needed for severe pain., Disp: 15 tablet, Rfl: 0  ROS  Constitutional: Denies any fever or chills Gastrointestinal: No reported hemesis, hematochezia, vomiting, or acute GI distress Musculoskeletal: Denies any acute onset joint swelling, redness, loss of ROM, or weakness Neurological: No reported episodes of acute onset apraxia, aphasia, dysarthria, agnosia, amnesia, paralysis, loss of coordination, or loss of consciousness  Allergies  Ms. Sailors is allergic to atorvastatin and omeprazole.  PFSH  Drug: Ms. Stamos  reports that she does not use drugs. Alcohol:  reports that she drinks alcohol. Tobacco:  reports that she quit smoking about 2  weeks ago. Her smoking use included cigarettes. She has a 66.00 pack-year smoking history. she has never used smokeless tobacco. Medical:  has a past medical history of Allergy, Arthritis, Asthma, Depression, Diabetes mellitus without complication ([place]), Emphysema of lung ([place]), GERD (gastroesophageal reflux disease), Hyperlipidemia, Hypertension, Osteoporosis, and Tobacco abuse counseling. Surgical: Ms. Mauney  has a past surgical history that includes vascular stent (03/28/2011); Cardiac catheterization (3/210); Kidney stone surgery (1999); Vaginal hysterectomy; and Tubal ligation. Family: family history includes Alcohol abuse in her father and sister; Breast cancer (age of onset: 63) in her sister; Cancer (age of onset: 31) in her sister; Coronary artery disease in her mother; Coronary artery disease (age of onset: 9) in her sister; Depression in her father; Heart attack in her mother and sister; Heart attack (age of onset: 54) in her father; Hyperlipidemia in her sister; Hypertension in her father, mother, and sister.  Constitutional Exam  General appearance: Well nourished, well developed, and well hydrated. In no apparent acute distress Vitals:   02/05/18 0954  BP: (!) 141/77  Pulse: (!) 58  Resp: 18  Temp: 97.6 F (36.4 C)  TempSrc: Oral  SpO2: 97%  Weight: 274 lb (124.3 kg)  Height: _0  (1.727 m)  Psych/Mental status: Alert, oriented x 3 (person, place, & time)       Eyes: PERLA Respiratory: No evidence of acute respiratory distress  Cervical Spine Area Exam  Skin & Axial Inspection: No masses, redness, edema, swelling, or associated skin lesions Alignment: Symmetrical Functional ROM: Unrestricted ROM      Stability: No instability detected Muscle Tone/Strength: Functionally intact. No obvious neuro-muscular anomalies detected. Sensory (Neurological): Unimpaired Palpation: Complains of area being tender to palpation              Upper Extremity (UE) Exam    Side: Right upper  extremity  Side: Left upper extremity  Skin & Extremity Inspection: Skin color, temperature, and hair growth are WNL. No peripheral edema or cyanosis. No masses, redness, swelling, asymmetry, or associated skin lesions. No contractures.  Skin & Extremity Inspection: Skin color, temperature, and hair growth are WNL. No peripheral edema or cyanosis. No masses, redness, swelling, asymmetry, or associated skin lesions. No contractures.  Functional ROM: Unrestricted ROM          Functional ROM: Unrestricted ROM          Muscle Tone/Strength: Functionally intact. No obvious neuro-muscular anomalies detected.  Muscle Tone/Strength: Functionally intact. No obvious neuro-muscular anomalies detected.  Sensory (Neurological): Unimpaired          Sensory (Neurological): Unimpaired  Palpation: No palpable anomalies              Palpation: No palpable anomalies              Specialized Test(s): Deferred         Specialized Test(s): Deferred          Thoracic Spine Area Exam  Skin & Axial Inspection: No masses, redness, or swelling Alignment: Symmetrical Functional ROM: Unrestricted ROM Stability: No instability detected Muscle Tone/Strength: Functionally intact. No obvious neuro-muscular anomalies detected. Sensory (Neurological): Unimpaired Muscle strength & Tone: Complains of area being tender to palpation  Lumbar Spine Area Exam  Skin & Axial Inspection: No masses, redness, or swelling Alignment: Symmetrical Functional ROM: Unrestricted ROM      Stability: No instability detected Muscle Tone/Strength: Functionally intact. No obvious neuro-muscular anomalies detected. Sensory (Neurological): Unimpaired Palpation: Complains of area being tender to palpation       Provocative Tests: Lumbar Hyperextension and rotation test: evaluation deferred today       Lumbar Lateral bending test: evaluation deferred today       Patrick's Maneuver: evaluation deferred today                    Gait & Posture  Assessment  Ambulation: Unassisted Gait: Relatively normal for age and body habitus Posture: WNL   Lower Extremity Exam    Side: Right lower extremity  Side: Left lower extremity  Skin & Extremity Inspection: Skin color, temperature, and hair growth are WNL. No peripheral edema or cyanosis. No masses, redness, swelling, asymmetry, or associated skin lesions. No contractures.  Skin & Extremity Inspection: Skin color, temperature, and hair growth are WNL. No peripheral edema or cyanosis. No masses, redness, swelling, asymmetry, or associated skin lesions. No contractures.  Functional ROM: Unrestricted ROM          Functional ROM: Unrestricted ROM          Muscle Tone/Strength: Functionally intact. No obvious neuro-muscular anomalies detected.  Muscle Tone/Strength: Functionally intact. No obvious neuro-muscular anomalies detected.  Sensory (Neurological): Unimpaired  Sensory (Neurological): Unimpaired  Palpation: No palpable anomalies  Palpation: No palpable anomalies   Assessment  Primary Diagnosis & Pertinent Problem List: The primary encounter diagnosis was Lumbar spondylosis. Diagnoses of Cervical spondylosis with radiculopathy (Right) (C5), Neurogenic pain, Neuropathy, Muscle spasm of back, and Chronic pain syndrome were also pertinent to this visit.  Status Diagnosis  Controlled Controlled Controlled 1. Lumbar spondylosis   2. Cervical spondylosis with radiculopathy (Right) (C5)   3. Neurogenic pain   4. Neuropathy   5. Muscle spasm of back   6. Chronic pain syndrome     Problems updated and reviewed during this visit: No problems updated. Plan of Care  Pharmacotherapy (Medications Ordered): Meds ordered this encounter  Medications  . oxyCODONE (ROXICODONE) 5 MG immediate release tablet    Sig: Take 1 tablet (5 mg total) by mouth every 6 (six) hours as needed for severe pain.    Dispense:  15 tablet    Refill:  0    Fill one day early if pharmacy is closed on scheduled refill  date. Do not fill until: 02/05/2018 To last until: 03/07/2018    Order Specific Question:   Supervising Provider    Answer:   Milinda Pointer [phone]   New Prescriptions   No medications on file   Medications administered today: Meliya Mcconahy had no medications administered during this visit. Lab-work, procedure(s), and/or referral(s): No orders of the defined  types were placed in this encounter.  Imaging and/or referral(s): None  Interventional therapies: Planned, scheduled, and/or pending:    Not at this time   Considering:   Diagnostic bilateral lumbar facet block  Possible bilateral lumbar facet RFA  Diagnostic right-sided cervical epidural steroid injection Diagnostic bilateral cervical facet block Possible bilateral cervical facet RFA  Diagnostic intra-articular hip joint injection Diagnostic bilateral femoral nerve and obturator nerve block Possible bilateral femoral nerve and obturator nerve RFA  Diagnostic right-sided occipital nerve block  Possible right-sided occipital nerve RFA  Possible right-sided occipital peripheral nerve stimulator trial    PRN Procedures:   To be determined at a later time   Provider-requested follow-up: Return for (PRN).  Future Appointments  Date Time Provider Ko Olina  02/25/2018  8:30 AM [facility] 1 [facility] None  02/25/2018  9:00 AM [facility] None  02/25/2018 10:00 AM Schnier, Dolores Lory, MD [facility] None   Primary Care Physician: Birdie Sons, MD Location: [facility] Note by: Vevelyn Francois NP Date: 02/05/2018; Time: 11:27 AM  Pain Score Disclaimer: We use the NRS-11 scale. This is a self-reported, subjective measurement of pain severity with only modest accuracy. It is used primarily to identify changes within a particular patient. It must be understood that outpatient pain scales are significantly less accurate that those used for research, where they can be applied under  ideal controlled circumstances with minimal exposure to variables. In reality, the score is likely to be a combination of pain intensity and pain affect, where pain affect describes the degree of emotional arousal or changes in action readiness caused by the sensory experience of pain. Factors such as social and work situation, setting, emotional state, anxiety levels, expectation, and prior pain experience may influence pain perception and show large inter-individual differences that may also be affected by time variables.  Patient instructions provided during this appointment: Patient Instructions   ____________________________________________________________________________________________ You have been given Rx for Oxycodone to last until 03/07/2018   Medication Rules  Applies to: All patients receiving prescriptions (written or electronic).  Pharmacy of record: Pharmacy where electronic prescriptions will be sent. If written prescriptions are taken to a different pharmacy, please inform the nursing staff. The pharmacy listed in the electronic medical record should be the one where you would like electronic prescriptions to be sent.  Prescription refills: Only during scheduled appointments. Applies to both, written and electronic prescriptions.  NOTE: The following applies primarily to controlled substances (Opioid* Pain Medications).   Patient's responsibilities: 1. Pain Pills: Bring all pain pills to every appointment (except for procedure appointments). 2. Pill Bottles: Bring pills in original pharmacy bottle. Always bring newest bottle. Bring bottle, even if empty. 3. Medication refills: You are responsible for knowing and keeping track of what medications you need refilled. The day before your appointment, write a list of all prescriptions that need to be refilled. Bring that list to your appointment and give it to the admitting nurse. Prescriptions will be written only during  appointments. If you forget a medication, it will not be "Called in", "Faxed", or "electronically sent". You will need to get another appointment to get these prescribed. 4. Prescription Accuracy: You are responsible for carefully inspecting your prescriptions before leaving our office. Have the discharge nurse carefully go over each prescription with you, before taking them home. Make sure that your name is accurately spelled, that your address is correct. Check the name and dose of your medication to make  sure it is accurate. Check the number of pills, and the written instructions to make sure they are clear and accurate. Make sure that you are given enough medication to last until your next medication refill appointment. 5. Taking Medication: Take medication as prescribed. Never take more pills than instructed. Never take medication more frequently than prescribed. Taking less pills or less frequently is permitted and encouraged, when it comes to controlled substances (written prescriptions).  6. Inform other Doctors: Always inform, all of your healthcare providers, of all the medications you take. 7. Pain Medication from other Providers: You are not allowed to accept any additional pain medication from any other Doctor or Healthcare provider. There are two exceptions to this rule. (see below) In the event that you require additional pain medication, you are responsible for notifying us, as stated below. 8. Medication Agreement: You are responsible for carefully reading and following our Medication Agreement. This must be signed before receiving any prescriptions from our practice. Safely store a copy of your signed Agreement. Violations to the Agreement will result in no further prescriptions. (Additional copies of our Medication Agreement are available upon request.) 9. Laws, Rules, & Regulations: All patients are expected to follow all Federal and [employer], [employer], Rules, [employer]. Ignorance of  the Laws does not constitute a valid excuse. The use of any illegal substances is prohibited. 10. Adopted CDC guidelines & recommendations: Target dosing levels will be at or below 60 MME/day. Use of benzodiazepines** is not recommended.  Exceptions: There are only two exceptions to the rule of not receiving pain medications from other Healthcare Providers. 1. Exception #1 (Emergencies): In the event of an emergency (i.e.: accident requiring emergency care), you are allowed to receive additional pain medication. However, you are responsible for: As soon as you are able, call our office [phone], at any time of the day or night, and leave a message stating your name, the date and nature of the emergency, and the name and dose of the medication prescribed. In the event that your call is answered by a member of our staff, make sure to document and save the date, time, and the name of the person that took your information.  2. Exception #2 (Planned Surgery): In the event that you are scheduled by another doctor or dentist to have any type of surgery or procedure, you are allowed (for a period no longer than 30 days), to receive additional pain medication, for the acute post-op pain. However, in this case, you are responsible for picking up a copy of our "Post-op Pain Management for Surgeons" handout, and giving it to your surgeon or dentist. This document is available at our office, and does not require an appointment to obtain it. Simply go to our office during business hours (Monday-Thursday from 8:00 AM to 4:00 PM) (Friday 8:00 AM to 12:00 Noon) or if you have a scheduled appointment with [place], prior to your surgery, and ask for it by name. In addition, you will need to provide [place] with your name, name of your surgeon, type of surgery, and date of procedure or surgery.  *Opioid medications include: morphine, codeine, oxycodone, oxymorphone, hydrocodone, hydromorphone, meperidine, tramadol, tapentadol,  buprenorphine, fentanyl, methadone. **Benzodiazepine medications include: diazepam (Valium), alprazolam (Xanax), clonazepam (Klonopine), lorazepam (Ativan), clorazepate (Tranxene), chlordiazepoxide (Librium), estazolam (Prosom), oxazepam (Serax), temazepam (Restoril), triazolam (Halcion) (Last updated: 01/31/2018) ____________________________________________________________________________________________    BMI Assessment: Estimated body mass index is 41.66 kg/m as calculated from the following:   Height as of  this encounter: _0  (1.727 m).   Weight as of this encounter: 274 lb (124.3 kg).  BMI interpretation table: BMI level Category Range association with higher incidence of chronic pain  <18 kg/m2 Underweight   18.5-24.9 kg/m2 Ideal body weight   25-29.9 kg/m2 Overweight Increased incidence by 20%  30-34.9 kg/m2 Obese (Class I) Increased incidence by 68%  35-39.9 kg/m2 Severe obesity (Class II) Increased incidence by 136%  >40 kg/m2 Extreme obesity (Class III) Increased incidence by 254%   BMI Readings from Last 4 Encounters:  02/05/18 41.66 kg/m  01/30/18 41.66 kg/m  01/01/18 40.16 kg/m  12/12/17 40.61 kg/m   Wt Readings from Last 4 Encounters:  02/05/18 274 lb (124.3 kg)  01/30/18 274 lb (124.3 kg)  01/01/18 268 lb (121.6 kg)  12/12/17 271 lb (122.9 kg)

## 2018-02-25 ENCOUNTER — Ambulatory Visit (INDEPENDENT_AMBULATORY_CARE_PROVIDER_SITE_OTHER): Payer: Medicare Other | Admitting: Vascular Surgery

## 2018-02-25 ENCOUNTER — Ambulatory Visit (INDEPENDENT_AMBULATORY_CARE_PROVIDER_SITE_OTHER): Payer: Medicare Other

## 2018-02-25 ENCOUNTER — Encounter (INDEPENDENT_AMBULATORY_CARE_PROVIDER_SITE_OTHER): Payer: Self-pay | Admitting: Vascular Surgery

## 2018-02-25 VITALS — BP 112/63 | HR 54 | Resp 14 | Ht 68.0 in | Wt 279.0 lb

## 2018-02-25 DIAGNOSIS — I10 Essential (primary) hypertension: Secondary | ICD-10-CM

## 2018-02-25 DIAGNOSIS — I6523 Occlusion and stenosis of bilateral carotid arteries: Secondary | ICD-10-CM | POA: Diagnosis not present

## 2018-02-25 DIAGNOSIS — I2510 Atherosclerotic heart disease of native coronary artery without angina pectoris: Secondary | ICD-10-CM | POA: Diagnosis not present

## 2018-02-25 DIAGNOSIS — I714 Abdominal aortic aneurysm, without rupture, unspecified: Secondary | ICD-10-CM

## 2018-02-25 DIAGNOSIS — I70213 Atherosclerosis of native arteries of extremities with intermittent claudication, bilateral legs: Secondary | ICD-10-CM

## 2018-02-25 NOTE — Progress Notes (Signed)
MRN : [id number]  Laura Nielsen is a 62 y.o. (1956-08-19) female who presents with chief complaint of  Chief Complaint  Patient presents with  . Follow-up    ultrasound  .  History of Present Illness: The patient returns to the office for surveillance of an abdominal aortic aneurysm status post stent graft placement on 03/27/2013.   Patient denies abdominal pain or back pain, no other abdominal complaints. No groin related complaints. No symptoms consistent with distal embolization No changes in claudication distance.   There have been no interval changes in his overall healthcare since his last visit.   Patient denies amaurosis fugax or TIA symptoms. There is no history of claudication or rest pain symptoms of the lower extremities. The patient denies angina or shortness of breath.     Current Meds  Medication Sig  . albuterol (PROVENTIL HFA) 108 (90 Base) MCG/ACT inhaler Inhale 2 puffs into the lungs every 6 (six) hours as needed for wheezing or shortness of breath.  . allopurinol (ZYLOPRIM) 100 MG tablet Take 2 tablets (200 mg total) by mouth daily.  Marland Kitchen aspirin 81 MG tablet Take 81 mg by mouth daily.   . bisoprolol (ZEBETA) 10 MG tablet Take 1 tablet (10 mg total) by mouth daily.  . budesonide-formoterol (SYMBICORT) 160-4.5 MCG/ACT inhaler Inhale 2 puffs into the lungs 2 (two) times daily.  . Cholecalciferol (VITAMIN D3) 2000 units capsule Take 1 capsule (2,000 Units total) by mouth daily.  . clopidogrel (PLAVIX) 75 MG tablet Take 1 tablet (75 mg total) by mouth daily.  . Cyanocobalamin (RA VITAMIN B-12 TR) 1000 MCG TBCR Take 2 tablets by mouth daily.   Marland Kitchen gabapentin (NEURONTIN) 300 MG capsule Take 300 mg by mouth 3 (three) times daily.  . Ibuprofen 200 MG CAPS Take by mouth.   Marland Kitchen lisinopril-hydrochlorothiazide (ZESTORETIC) 20-12.5 MG tablet Take 1 tablet by mouth daily.  Marland Kitchen loratadine (CLARITIN) 10 MG tablet Take 10 mg by mouth daily as needed.   Marland Kitchen oxyCODONE (ROXICODONE) 5 MG  immediate release tablet Take 1 tablet (5 mg total) by mouth every 6 (six) hours as needed for severe pain.  . rosuvastatin (CRESTOR) 20 MG tablet Take 1 tablet (20 mg total) by mouth daily.  . vitamin C (ASCORBIC ACID) 500 MG tablet Take 500 mg by mouth.     Past Medical History:  Diagnosis Date  . Allergy   . Arthritis   . Asthma   . Depression   . Diabetes mellitus without complication ([place])   . Emphysema of lung ([place])   . GERD (gastroesophageal reflux disease)   . Hyperlipidemia   . Hypertension   . Osteoporosis   . Tobacco abuse counseling     Past Surgical History:  Procedure Laterality Date  . Cardiac catheterization  3/210   70-80% stenosis RCA stent placed. started on Plavix  . [place]  . TUBAL LIGATION    . VAGINAL HYSTERECTOMY     Menometrorrhagia. Excessive bleeding. Unknown if cervix removed.   . vascular stent  03/28/2011   Dr. Delana Meyer, [facility]; Infrarenal    Social History Social History   Tobacco Use  . Smoking status: Former Smoker    Packs/day: 1.50    Years: 44.00    Pack years: 66.00    Types: Cigarettes    Last attempt to quit: 01/18/2018    Years since quitting: 0.1  . Smokeless tobacco: Never Used  . Tobacco comment: previously smoked over 2 ppd  Substance  Use Topics  . Alcohol use: Yes    Comment: Rarely  . Drug use: No    Family History Family History  Problem Relation Age of Onset  . Hypertension Mother   . Coronary artery disease Mother   . Heart attack Mother        acute  . Alcohol abuse Father   . Depression Father   . Hypertension Father   . Heart attack Father 57       acute  . Alcohol abuse Sister   . Hyperlipidemia Sister   . Hypertension Sister   . Cancer Sister 70  . Heart attack Sister        x's 2  . Coronary artery disease Sister 97       x's 2  . Breast cancer Sister 65    Allergies  Allergen Reactions  . Atorvastatin Other (See Comments)    Elevated blood sugar Elevated blood sugar  .  Omeprazole Nausea And Vomiting     REVIEW OF SYSTEMS (Negative unless checked)  Constitutional: [] Weight loss  [] Fever  [] Chills Cardiac: [] Chest pain   [] Chest pressure   [] Palpitations   [] Shortness of breath when laying flat   [] Shortness of breath with exertion. Vascular:  [] Pain in legs with walking   [] Pain in legs at rest  [] History of DVT   [] Phlebitis   [] Swelling in legs   [] Varicose veins   [] Non-healing ulcers Pulmonary:   [] Uses home oxygen   [] Productive cough   [] Hemoptysis   [] Wheeze  [] COPD   [] Asthma Neurologic:  [] Dizziness   [] Seizures   [] History of stroke   [] History of TIA  [] Aphasia   [] Vissual changes   [] Weakness or numbness in arm   [] Weakness or numbness in leg Musculoskeletal:   [] Joint swelling   [] Joint pain   [] Low back pain Hematologic:  [] Easy bruising  [] Easy bleeding   [] Hypercoagulable state   [] Anemic Gastrointestinal:  [] Diarrhea   [] Vomiting  [] Gastroesophageal reflux/heartburn   [] Difficulty swallowing. Genitourinary:  [] Chronic kidney disease   [] Difficult urination  [] Frequent urination   [] Blood in urine Skin:  [] Rashes   [] Ulcers  Psychological:  [] History of anxiety   []  History of major depression.  Physical Examination  Vitals:   02/25/18 0958  BP: 112/63  Pulse: (!) 54  Resp: 14  Weight: 279 lb (126.6 kg)  Height: 5\' 8"  (1.727 m)   Body mass index is 42.42 kg/m. Gen: WD/WN, NAD Head: [place]/AT, No temporalis wasting.  Ear/Nose/Throat: Hearing grossly intact, nares w/o erythema or drainage Eyes: PER, EOMI, sclera nonicteric.  Neck: Supple, no large masses.   Pulmonary:  Good air movement, no audible wheezing bilaterally, no use of accessory muscles.  Cardiac: RRR, no JVD Vascular:  Vessel Right Left  Radial Palpable Palpable  PT Trace Palpable Trace Palpable  DP Trace Palpable Trace Palpable  Gastrointestinal: Non-distended. No guarding/no peritoneal signs.  Musculoskeletal: M/S 5/5 throughout.  No deformity or atrophy.    Neurologic: CN 2-12 intact. Symmetrical.  Speech is fluent. Motor exam as listed above. Psychiatric: Judgment intact, Mood & affect appropriate for pt's clinical situation. Dermatologic: No rashes or ulcers noted.  No changes consistent with cellulitis. Lymph : No lichenification or skin changes of chronic lymphedema.  CBC Lab Results  Component Value Date   WBC 9.6 08/30/2017   HGB 15.1 08/30/2017   HCT 43.9 08/30/2017   MCV 85.2 08/30/2017   PLT 245 08/30/2017    BMET    Component Value Date/Time  NA 142 08/30/2017 0920   NA 144 01/09/2017 0947   NA 139 03/28/2013 0354   K 3.9 08/30/2017 0920   K 3.7 03/28/2013 1811   CL 110 08/30/2017 0920   CL 107 03/28/2013 0354   CO2 22 08/30/2017 0920   CO2 27 03/28/2013 0354   GLUCOSE 117 (H) 08/30/2017 0920   GLUCOSE 112 (H) 03/28/2013 0354   BUN 14 08/30/2017 0920   BUN 13 01/09/2017 0947   BUN 8 03/28/2013 0354   CREATININE 0.60 08/30/2017 0920   CALCIUM 9.2 08/30/2017 0920   CALCIUM 8.2 (L) 03/28/2013 0354   GFRNONAA 99 08/30/2017 0920   GFRAA 115 08/30/2017 0920   CrCl cannot be calculated (Patient's most recent lab result is older than the maximum 21 days allowed.).  COAG Lab Results  Component Value Date   INR 0.9 04/26/2017   INR 1.1 03/28/2013    Radiology No results found.    Assessment/Plan 1. AAA (abdominal aortic aneurysm) without rupture (HCC) Recommend: Patient is status post successful endovascular repair of the AAA.   No further intervention is required at this time.   No endoleak is detected and the aneurysm sac is stable.  The patient will continue antiplatelet therapy as prescribed as well as aggressive management of hyperlipidemia. Exercise is again strongly encouraged.   However, endografts require continued surveillance with ultrasound or CT scan. This is mandatory to detect any changes that allow repressurization of the aneurysm sac.  The patient is informed that this would be  asymptomatic.  The patient is reminded that lifelong routine surveillance is a necessity with an endograft. Patient will continue to follow-up at 6 month intervals with ultrasound of the aorta. - VAS US AORTA/IVC/ILIACS; Future  2. Atherosclerosis of native artery of both lower extremities with intermittent claudication (HCC)  Recommend:  The patient has evidence of atherosclerosis of the lower extremities with claudication.  The patient does not voice lifestyle limiting changes at this point in time.  Noninvasive studies do not suggest clinically significant change.  No invasive studies, angiography or surgery at this time The patient should continue walking and begin a more formal exercise program.  The patient should continue antiplatelet therapy and aggressive treatment of the lipid abnormalities  No changes in the patient's medications at this time  The patient should continue wearing graduated compression socks 10-15 mmHg strength to control the mild edema.   - VAS [place] ABI WITH/WO TBI; Future  3. Bilateral carotid artery stenosis Recommend:  Given the patient's asymptomatic subcritical stenosis no further invasive testing or surgery at this time.  Continue antiplatelet therapy as prescribed Continue management of CAD, HTN and Hyperlipidemia Healthy heart diet,  encouraged exercise at least 4 times per week Follow up in 6 months with duplex ultrasound and physical exam   4. CAD in native artery Continue cardiac and antihypertensive medications as already ordered and reviewed, no changes at this time.  Continue statin as ordered and reviewed, no changes at this time  Nitrates PRN for chest pain   5. Benign essential HTN Continue antihypertensive medications as already ordered, these medications have been reviewed and there are no changes at this time.     Hortencia Pilar, MD  02/25/2018 8:43 PM

## 2018-03-15 ENCOUNTER — Other Ambulatory Visit: Payer: Self-pay | Admitting: Family Medicine

## 2018-04-05 ENCOUNTER — Ambulatory Visit (INDEPENDENT_AMBULATORY_CARE_PROVIDER_SITE_OTHER): Payer: Medicare Other | Admitting: Family Medicine

## 2018-04-05 ENCOUNTER — Encounter: Payer: Self-pay | Admitting: Family Medicine

## 2018-04-05 VITALS — BP 120/78 | HR 69 | Temp 98.6°F | Resp 16 | Wt 283.0 lb

## 2018-04-05 DIAGNOSIS — H6692 Otitis media, unspecified, left ear: Secondary | ICD-10-CM | POA: Diagnosis not present

## 2018-04-05 DIAGNOSIS — J069 Acute upper respiratory infection, unspecified: Secondary | ICD-10-CM

## 2018-04-05 DIAGNOSIS — I2510 Atherosclerotic heart disease of native coronary artery without angina pectoris: Secondary | ICD-10-CM

## 2018-04-05 DIAGNOSIS — I6523 Occlusion and stenosis of bilateral carotid arteries: Secondary | ICD-10-CM | POA: Diagnosis not present

## 2018-04-05 MED ORDER — LIRAGLUTIDE -WEIGHT MANAGEMENT 18 MG/3ML ~~LOC~~ SOPN: 3.0000 mg | PEN_INJECTOR | Freq: Every day | SUBCUTANEOUS | 5 refills | Status: DC

## 2018-04-05 MED ORDER — AZITHROMYCIN 250 MG PO TABS: ORAL_TABLET | ORAL | 0 refills | Status: AC

## 2018-04-05 NOTE — Patient Instructions (Signed)
   Titrate Saxenda dose as follows. Start 0.6mg  daily for 7 days, then 1.2mg  daily for 7 days, then 1.8mg  daily for 7 days, then 2.4mg  daily for 7 days, then 3.0mg  daily

## 2018-04-05 NOTE — Progress Notes (Signed)
Patient: Laura Nielsen Female    DOB: 08/07/1956   62 y.o.   MRN: [id number] Visit Date: 04/05/2018  Today's Provider: Lelon Huh, MD   Chief Complaint  Patient presents with  . Cough   Subjective:    Patient has had a productive cough for 6 days. Patient also has symptoms of shortness of breath, wheezing nasal congestion, chills, left ear pain, low grade fever 100.8, sinus pressure and chest tightness. Patient has been taking otc nightquil and Flonase. Also she is taking prescribed Claritin.   Cough  This is a new problem. The current episode started in the past 7 days. The problem has been unchanged. The problem occurs every few minutes. The cough is productive of sputum. Associated symptoms include chills, ear congestion, ear pain, a fever, headaches, nasal congestion, a sore throat and wheezing. Pertinent negatives include no chest pain, heartburn, hemoptysis, myalgias, postnasal drip, rash, rhinorrhea, shortness of breath, sweats or weight loss. The symptoms are aggravated by lying down and exercise. Treatments tried: nightquil, claritin, flonase. The treatment provided mild relief. Her past medical history is significant for bronchitis and environmental allergies. There is no history of asthma, bronchiectasis, COPD, emphysema or pneumonia.   She also requests help with losing weight. States she tries to stick to healthy diet but stays hungry all the time. She does havediabetes with a1c of 6.4%. Has difficulty exercising due to pain in knees and back.     Allergies  Allergen Reactions  . Atorvastatin Other (See Comments)    Elevated blood sugar Elevated blood sugar  . Omeprazole Nausea And Vomiting     Current Outpatient Medications:  .  albuterol (PROVENTIL HFA) 108 (90 Base) MCG/ACT inhaler, Inhale 2 puffs into the lungs every 6 (six) hours as needed for wheezing or shortness of breath., Disp: 18 g, Rfl: 11 .  allopurinol (ZYLOPRIM) 100 MG tablet, Take 2 tablets (200 mg  total) by mouth daily., Disp: 60 tablet, Rfl: 11 .  aspirin 81 MG tablet, Take 81 mg by mouth daily. , Disp: , Rfl:  .  bisoprolol (ZEBETA) 10 MG tablet, Take 1 tablet (10 mg total) by mouth daily., Disp: 30 tablet, Rfl: 5 .  budesonide-formoterol (SYMBICORT) 160-4.5 MCG/ACT inhaler, Inhale 2 puffs into the lungs 2 (two) times daily., Disp: 1 Inhaler, Rfl: 11 .  Cholecalciferol (VITAMIN D3) 2000 units capsule, Take 1 capsule (2,000 Units total) by mouth daily., Disp: 30 capsule, Rfl: PRN .  clopidogrel (PLAVIX) 75 MG tablet, Take 1 tablet (75 mg total) by mouth daily., Disp: 90 tablet, Rfl: 2 .  Cyanocobalamin (RA VITAMIN B-12 TR) 1000 MCG TBCR, Take 2 tablets by mouth daily. , Disp: , Rfl:  .  gabapentin (NEURONTIN) 300 MG capsule, Take 300 mg by mouth 3 (three) times daily., Disp: , Rfl:  .  Ibuprofen 200 MG CAPS, Take by mouth. , Disp: , Rfl:  .  lisinopril-hydrochlorothiazide (ZESTORETIC) 20-12.5 MG tablet, Take 1 tablet by mouth daily., Disp: 90 tablet, Rfl: 3 .  loratadine (CLARITIN) 10 MG tablet, Take 10 mg by mouth daily as needed. , Disp: , Rfl:  .  oxyCODONE (ROXICODONE) 5 MG immediate release tablet, Take 1 tablet (5 mg total) by mouth every 6 (six) hours as needed for severe pain., Disp: 15 tablet, Rfl: 0 .  rosuvastatin (CRESTOR) 20 MG tablet, Take 1 tablet (20 mg total) by mouth daily., Disp: 30 tablet, Rfl: 5 .  vitamin C (ASCORBIC ACID) 500 MG tablet, Take 500  mg by mouth. , Disp: , Rfl:  .  gabapentin (NEURONTIN) 300 MG capsule, Take 1 capsule (300 mg total) by mouth 3 (three) times daily., Disp: 90 capsule, Rfl: 0  Review of Systems  Constitutional: Positive for chills and fever. Negative for appetite change, fatigue and weight loss.  HENT: Positive for congestion, ear pain, sinus pressure and sore throat. Negative for postnasal drip and rhinorrhea.   Respiratory: Positive for cough, chest tightness and wheezing. Negative for hemoptysis and shortness of breath.   Cardiovascular:  Negative for chest pain and palpitations.  Gastrointestinal: Negative for abdominal pain, heartburn, nausea and vomiting.  Musculoskeletal: Negative for myalgias.  Skin: Negative for rash.  Allergic/Immunologic: Positive for environmental allergies.  Neurological: Positive for headaches. Negative for dizziness and weakness.    Social History   Tobacco Use  . Smoking status: Former Smoker    Packs/day: 1.50    Years: 44.00    Pack years: 66.00    Types: Cigarettes    Last attempt to quit: 01/18/2018    Years since quitting: 0.2  . Smokeless tobacco: Never Used  . Tobacco comment: previously smoked over 2 ppd  Substance Use Topics  . Alcohol use: Yes    Comment: Rarely   Objective:   BP 120/78 (BP Location: Right Arm, Patient Position: Sitting, Cuff Size: Large)   Pulse 69   Temp 98.6 F (37 C) (Oral)   Resp 16   Wt 283 lb (128.4 kg)   SpO2 96%   BMI 43.03 kg/m  Vitals:   04/05/18 1004  BP: 120/78  Pulse: 69  Resp: 16  Temp: 98.6 F (37 C)  TempSrc: Oral  SpO2: 96%  Weight: 283 lb (128.4 kg)     Physical Exam  General Appearance:    Alert, cooperative, no distress, morbidly obese  HENT:   right TM normal without fluid or infection, left TM red, dull, bulging, neck without nodes, throat normal without erythema or exudate, sinuses nontender, post nasal drip noted and nasal mucosa pale and congested  Eyes:    PERRL, conjunctiva/corneas clear, EOM's intact       Lungs:     Clear to auscultation bilaterally, respirations unlabored  Heart:    Regular rate and rhythm  Neurologic:   Awake, alert, oriented x 3. No apparent focal neurological           defect.         Assessment & Plan:     1. Left otitis media, unspecified otitis media type  - azithromycin (ZITHROMAX) 250 MG tablet; 2 by mouth today, then 1 daily for 4 days  Dispense: 6 tablet; Refill: 0  2. Upper respiratory tract infection, unspecified type   3. CAD in native artery Asymptomatic. Compliant  with medication.  Continue aggressive risk factor modification.  Not candidate for stimulant appetite suppressant.   4. Morbid obesity ([place]) Discussed healthy diet and exercise which is limited by chronic pains. Discussed various medications to help lose weight. Consider CAD and diabetes she would probably do well with liraglutide, counseled on ptoential common and serious adverse effects. Instructed on proper administration and given samples to try.  - Liraglutide -Weight Management (SAXENDA) 18 MG/3ML SOPN; Inject 3 mg into the skin daily. Start 0.6mg  daily for 7 days, then 1.2mg  daily for 7 days, then 1.8mg  daily for 7 days, then 2.4mg  daily for 7 days, then 3.0mg  daily  Dispense: 5 pen; Refill: 5  Addressed extensive list of chronic and acute medical problems today  requiring extensive time in counseling and coordination of care.  Over half of this 45 minute visit were spent in counseling and coordinating care of multiple medical problems.       Lelon Huh, MD  [facility]

## 2018-04-12 ENCOUNTER — Other Ambulatory Visit: Payer: Self-pay | Admitting: Family Medicine

## 2018-04-12 MED ORDER — CLOPIDOGREL BISULFATE 75 MG PO TABS: ORAL_TABLET | ORAL | 3 refills | Status: DC

## 2018-04-12 NOTE — Telephone Encounter (Signed)
[facility] faxed a refill request for the following medication. Thanks CC  clopidogrel (PLAVIX) 75 MG tablet

## 2018-04-30 ENCOUNTER — Telehealth: Payer: Self-pay | Admitting: Family Medicine

## 2018-04-30 NOTE — Telephone Encounter (Signed)
Needs a nurse to call her about the rx for Saxenda.  She said is too expensive and she will not be filling the rx.  She did use the samples  Pt's call back is [phone]  Thanks teri

## 2018-05-01 MED ORDER — SEMAGLUTIDE(0.25 OR 0.5MG/DOS) 2 MG/1.5ML ~~LOC~~ SOPN: 0.5000 mg | PEN_INJECTOR | SUBCUTANEOUS | 3 refills | Status: DC

## 2018-05-01 NOTE — Telephone Encounter (Signed)
LMOVM for pt to return call 

## 2018-05-01 NOTE — Telephone Encounter (Signed)
Patient was advised and scheduled f/u.

## 2018-05-01 NOTE — Telephone Encounter (Signed)
If tolerating Saxenda, then recommend change to Ozempic. Its a once a week injection for diabetes that that helps with weight loss. Can send prescription to her pharmacy once advised.

## 2018-05-01 NOTE — Telephone Encounter (Signed)
Patient had stated previously that she did well on Saxenda.

## 2018-05-01 NOTE — Telephone Encounter (Signed)
Patient called back. Patient stated her pharmacy told her that even if [place] is approved her co pay will be to expensive for her to afford. Patient would like to try a different medication. Please advise?

## 2018-05-01 NOTE — Telephone Encounter (Signed)
Have sent prescription ozempic to her pharmacy. Only take it once a week.  Need to schedule follow up 1 month

## 2018-06-03 ENCOUNTER — Ambulatory Visit (INDEPENDENT_AMBULATORY_CARE_PROVIDER_SITE_OTHER): Payer: Medicare Other | Admitting: Family Medicine

## 2018-06-03 ENCOUNTER — Encounter: Payer: Self-pay | Admitting: Family Medicine

## 2018-06-03 VITALS — BP 110/70 | HR 76 | Temp 98.3°F | Resp 18 | Ht 68.5 in | Wt 272.0 lb

## 2018-06-03 DIAGNOSIS — J449 Chronic obstructive pulmonary disease, unspecified: Secondary | ICD-10-CM

## 2018-06-03 DIAGNOSIS — E119 Type 2 diabetes mellitus without complications: Secondary | ICD-10-CM | POA: Diagnosis not present

## 2018-06-03 DIAGNOSIS — I10 Essential (primary) hypertension: Secondary | ICD-10-CM | POA: Diagnosis not present

## 2018-06-03 DIAGNOSIS — F17210 Nicotine dependence, cigarettes, uncomplicated: Secondary | ICD-10-CM

## 2018-06-03 DIAGNOSIS — E7800 Pure hypercholesterolemia, unspecified: Secondary | ICD-10-CM | POA: Diagnosis not present

## 2018-06-03 DIAGNOSIS — L821 Other seborrheic keratosis: Secondary | ICD-10-CM

## 2018-06-03 DIAGNOSIS — I714 Abdominal aortic aneurysm, without rupture: Secondary | ICD-10-CM | POA: Diagnosis not present

## 2018-06-03 DIAGNOSIS — I2510 Atherosclerotic heart disease of native coronary artery without angina pectoris: Secondary | ICD-10-CM | POA: Diagnosis not present

## 2018-06-03 DIAGNOSIS — I6523 Occlusion and stenosis of bilateral carotid arteries: Secondary | ICD-10-CM | POA: Diagnosis not present

## 2018-06-03 DIAGNOSIS — E782 Mixed hyperlipidemia: Secondary | ICD-10-CM | POA: Diagnosis not present

## 2018-06-03 LAB — POCT GLYCOSYLATED HEMOGLOBIN (HGB A1C)
Est. average glucose Bld gHb Est-mCnc: 140
Hemoglobin A1C: 6.5 % — AB (ref 4.0–5.6)

## 2018-06-03 NOTE — Patient Instructions (Signed)
   Continue taking bupropion (Wellbutrin) once a day for two weeks, then try increasing to twice a day

## 2018-06-03 NOTE — Progress Notes (Addendum)
Patient: Laura Nielsen Female    DOB: [birth year]   62 y.o.   MRN: [id number] Visit Date: 06/03/2018  Today's Provider: Lelon Huh, MD   Chief Complaint  Patient presents with  . Obesity  . Diabetes   Subjective:    HPI   Diabetes Mellitus Type II, Follow-up:   Lab Results  Component Value Date   HGBA1C 6.4 12/12/2017   HGBA1C 6.3 08/30/2017   HGBA1C 6.5 05/01/2017   Last seen for diabetes 6 months ago.  Management since then includes; no changes.  She reports good compliance with treatment. She is not having side effects.  Current symptoms include polydipsia and have been stable. Home blood sugar records: blood sugars are not checked  Episodes of hypoglycemia? no   Current Insulin Regimen: none Most Recent Eye Exam: <1 year ago Weight trend: decreasing steadily Prior visit with dietician: no Current diet: in general, a "healthy" diet   Current exercise: none  ------------------------------------------------------------------------  Morbid obesity ([place]) From 04/05/2018-given samples of [place]. Later changed to Ozempic due to cost of Sax[place]. Today patient reports  Good compliance with treatment, and good tolerance. Patient has lost 11 pounds since her last office visit.  Wt Readings from Last 5 Encounters:  06/03/18 272 lb (123.4 kg)  04/05/18 283 lb (128.4 kg)  02/25/18 279 lb (126.6 kg)  02/05/18 274 lb (124.3 kg)  01/30/18 274 lb (124.3 kg)    Lipid/Cholesterol, Follow-up:   Last seen for this4 months ago.  Management changes since that visit include changing lovastatin to rosuvastatin due to low HDL and LDL over 70. Marland Kitchen Last Lipid Panel:    Component Value Date/Time   CHOL 143 12/12/2017 0928   TRIG 131 12/12/2017 0928   HDL 30 (L) 12/12/2017 0928   CHOLHDL 4.8 (H) 12/12/2017 0928   LDLCALC 87 12/12/2017 0928     She reports good compliance with treatment. She is not having side effects.    Wt Readings from Last 3 Encounters:  06/03/18  272 lb (123.4 kg)  04/05/18 283 lb (128.4 kg)  02/25/18 279 lb (126.6 kg)    -------------------------------------------------------------------  States she tolerated one tablet of wellbutrin well, but taking two a day made he sick on her stomach. She had quit for 3 months but has since restarted smoking.   Allergies  Allergen Reactions  . Atorvastatin Other (See Comments)    Elevated blood sugar Elevated blood sugar  . Omeprazole Nausea And Vomiting     Current Outpatient Medications:  .  albuterol (PROVENTIL HFA) 108 (90 Base) MCG/ACT inhaler, Inhale 2 puffs into the lungs every 6 (six) hours as needed for wheezing or shortness of breath., Disp: 18 g, Rfl: 11 .  allopurinol (ZYLOPRIM) 100 MG tablet, Take 2 tablets (200 mg total) by mouth daily., Disp: 60 tablet, Rfl: 11 .  aspirin 81 MG tablet, Take 81 mg by mouth daily. , Disp: , Rfl:  .  bisoprolol (ZEBETA) 10 MG tablet, Take 1 tablet (10 mg total) by mouth daily., Disp: 30 tablet, Rfl: 5 .  budesonide-formoterol (SYMBICORT) 160-4.5 MCG/ACT inhaler, Inhale 2 puffs into the lungs 2 (two) times daily., Disp: 1 Inhaler, Rfl: 11 .  Cholecalciferol (VITAMIN D3) 2000 units capsule, Take 1 capsule (2,000 Units total) by mouth daily., Disp: 30 capsule, Rfl: PRN .  clopidogrel (PLAVIX) 75 MG tablet, Take 1 tablet (75 mg total) by mouth daily., Disp: 90 tablet, Rfl: 3 .  Cyanocobalamin (RA VITAMIN B-12 TR) 1000 MCG  TBCR, Take 2 tablets by mouth daily. , Disp: , Rfl:  .  gabapentin (NEURONTIN) 300 MG capsule, Take 300 mg by mouth 3 (three) times daily., Disp: , Rfl:  .  Ibuprofen 200 MG CAPS, Take by mouth. , Disp: , Rfl:  .  lisinopril-hydrochlorothiazide (ZESTORETIC) 20-12.5 MG tablet, Take 1 tablet by mouth daily., Disp: 90 tablet, Rfl: 3 .  loratadine (CLARITIN) 10 MG tablet, Take 10 mg by mouth daily as needed. , Disp: , Rfl:  .  rosuvastatin (CRESTOR) 20 MG tablet, Take 1 tablet (20 mg total) by mouth daily., Disp: 30 tablet, Rfl: 5 .   Semaglutide (OZEMPIC) 0.25 or 0.5 MG/DOSE SOPN, Inject 0.5 mg into the skin once a week., Disp: 4 pen, Rfl: 3 .  vitamin C (ASCORBIC ACID) 500 MG tablet, Take 500 mg by mouth. , Disp: , Rfl:  .  gabapentin (NEURONTIN) 300 MG capsule, Take 1 capsule (300 mg total) by mouth 3 (three) times daily., Disp: 90 capsule, Rfl: 0 .  oxyCODONE (ROXICODONE) 5 MG immediate release tablet, Take 1 tablet (5 mg total) by mouth every 6 (six) hours as needed for severe pain., Disp: 15 tablet, Rfl: 0  Review of Systems  Constitutional: Negative for appetite change, chills, fatigue and fever.  Respiratory: Negative for chest tightness and shortness of breath.   Cardiovascular: Negative for chest pain and palpitations.  Gastrointestinal: Negative for abdominal pain, nausea and vomiting.  Skin:       Multiple skin lesions  Neurological: Negative for dizziness and weakness.    Social History   Tobacco Use  . Smoking status: Current Every Day Smoker    Packs/day: 1.00    Years: 44.00    Pack years: 44.00    Types: Cigarettes    Last attempt to quit: 01/18/2018    Years since quitting: 0.3  . Smokeless tobacco: Never Used  . Tobacco comment: previously smoked over 2 ppd. Quit 01/2018  Substance Use Topics  . Alcohol use: Yes    Comment: Rarely   Objective:   BP 110/70 (BP Location: Left Arm, Patient Position: Sitting, Cuff Size: Large)   Pulse 76   Temp 98.3 F (36.8 C) (Oral)   Resp 18   Ht 5' 8.5" (1.74 m)   Wt 272 lb (123.4 kg)   SpO2 97% Comment: room air  BMI 40.76 kg/m  Vitals:   06/03/18 0855  BP: 110/70  Pulse: 76  Resp: 18  Temp: 98.3 F (36.8 C)  TempSrc: Oral  SpO2: 97%  Weight: 272 lb (123.4 kg)  Height: 5' 8.5" (1.74 m)     Physical Exam  General Appearance:    Alert, cooperative, no distress, obese  Eyes:    PERRL, conjunctiva/corneas clear, EOM's intact       Lungs:     Clear to auscultation bilaterally, respirations unlabored  Heart:    Regular rate and rhythm    Neurologic:   Awake, alert, oriented x 3. No apparent focal neurological           defect.        Results for orders placed or performed in visit on 06/03/18  POCT HgB A1C  Result Value Ref Range   Hemoglobin A1C 6.5 (A) 4.0 - 5.6 %   HbA1c POC (<> result, manual entry)  4.0 - 5.6 %   HbA1c, POC (prediabetic range)  5.7 - 6.4 %   HbA1c, POC (controlled diabetic range)  0.0 - 7.0 %   Est. average glucose  Bld gHb Est-mCnc 140        Assessment & Plan:     1. Type 2 diabetes mellitus without complication, without long-term current use of insulin (HCC) Well controlled.  Continue current medications.   - POCT HgB A1C  2. CAD in native artery Asymptomatic. Compliant with medication.  Continue aggressive risk factor modification.  Stressed importance of smoking cessation.   3. CAFL (chronic airflow limitation) (HCC) Continue current inhalers.   4. Morbid obesity  Working on weight loss and starting to walk a couple of days a week with her grandson. Is to work up to 5 days a week. Expect some improvement with weight on Ozempic.   5. Smoking greater than 30 pack years Currently tolerating one tablet a day of bupropion. Advised to continue one a day for two weeks, then increase to 2 a day and stop smoking.   6. Hypercholesteremia Tolerating change to rosuvastatin well. Check - Comprehensive metabolic panel- Lipid panel   7. Seborrheic keratosis She asks to have me look at a brown crusty stuck on appearing mole on her right shoulder while walking down the hallway after appointment. Lesion is clearly a seborrheic keratosis and reassured of benign nature of lesion.       Lelon Huh, MD  [facility]

## 2018-06-04 ENCOUNTER — Telehealth: Payer: Self-pay | Admitting: *Deleted

## 2018-06-04 DIAGNOSIS — E7800 Pure hypercholesterolemia, unspecified: Secondary | ICD-10-CM

## 2018-06-04 LAB — COMPREHENSIVE METABOLIC PANEL
ALT: 24 IU/L (ref 0–32)
AST: 31 IU/L (ref 0–40)
Albumin/Globulin Ratio: 1.8 (ref 1.2–2.2)
Albumin: 4.2 g/dL (ref 3.6–4.8)
Alkaline Phosphatase: 98 IU/L (ref 39–117)
BUN/Creatinine Ratio: 14 (ref 12–28)
BUN: 10 mg/dL (ref 8–27)
Bilirubin Total: 0.6 mg/dL (ref 0.0–1.2)
CO2: 22 mmol/L (ref 20–29)
Calcium: 9.6 mg/dL (ref 8.7–10.3)
Chloride: 104 mmol/L (ref 96–106)
Creatinine, Ser: 0.69 mg/dL (ref 0.57–1.00)
GFR calc Af Amer: 109 mL/min/{1.73_m2} (ref >59)
GFR calc non Af Amer: 94 mL/min/{1.73_m2} (ref >59)
Globulin, Total: 2.4 g/dL (ref 1.5–4.5)
Glucose: 105 mg/dL — ABNORMAL HIGH (ref 65–99)
Potassium: 3.8 mmol/L (ref 3.5–5.2)
Sodium: 141 mmol/L (ref 134–144)
Total Protein: 6.6 g/dL (ref 6.0–8.5)

## 2018-06-04 LAB — LIPID PANEL
Chol/HDL Ratio: 3.7 ratio (ref 0.0–4.4)
Cholesterol, Total: 114 mg/dL (ref 100–199)
HDL: 31 mg/dL — ABNORMAL LOW (ref >39)
LDL Calculated: 57 mg/dL (ref 0–99)
Triglycerides: 129 mg/dL (ref 0–149)
VLDL Cholesterol Cal: 26 mg/dL (ref 5–40)

## 2018-06-04 MED ORDER — ROSUVASTATIN CALCIUM 20 MG PO TABS: 20.0000 mg | ORAL_TABLET | Freq: Every day | ORAL | 12 refills | Status: DC

## 2018-06-04 NOTE — Telephone Encounter (Signed)
Patient advised and verbally voiced understanding. Prescription sent into pharmacy.

## 2018-06-04 NOTE — Telephone Encounter (Signed)
-----   Message from Birdie Sons, MD sent at 06/04/2018  9:10 AM EDT ----- Cholesterol much better, LDL is down to 57, need to keep it under 70. Continue current dose of rosuvastatin. Can send in a years refill to pharmacy of her choice  Follow up in November as scheduled.

## 2018-06-04 NOTE — Telephone Encounter (Signed)
LMOVM for pt to return call 

## 2018-09-04 ENCOUNTER — Other Ambulatory Visit: Payer: Self-pay | Admitting: Family Medicine

## 2018-10-03 ENCOUNTER — Other Ambulatory Visit: Payer: Self-pay | Admitting: Family Medicine

## 2018-10-04 ENCOUNTER — Ambulatory Visit (INDEPENDENT_AMBULATORY_CARE_PROVIDER_SITE_OTHER): Payer: Medicare Other | Admitting: Family Medicine

## 2018-10-04 ENCOUNTER — Encounter: Payer: Self-pay | Admitting: Family Medicine

## 2018-10-04 VITALS — BP 124/82 | HR 78 | Temp 98.0°F | Wt 257.0 lb

## 2018-10-04 DIAGNOSIS — M4696 Unspecified inflammatory spondylopathy, lumbar region: Secondary | ICD-10-CM | POA: Insufficient documentation

## 2018-10-04 DIAGNOSIS — F3341 Major depressive disorder, recurrent, in partial remission: Secondary | ICD-10-CM

## 2018-10-04 DIAGNOSIS — E119 Type 2 diabetes mellitus without complications: Secondary | ICD-10-CM | POA: Diagnosis not present

## 2018-10-04 DIAGNOSIS — I6523 Occlusion and stenosis of bilateral carotid arteries: Secondary | ICD-10-CM | POA: Diagnosis not present

## 2018-10-04 DIAGNOSIS — F17210 Nicotine dependence, cigarettes, uncomplicated: Secondary | ICD-10-CM

## 2018-10-04 LAB — POCT GLYCOSYLATED HEMOGLOBIN (HGB A1C)
Est. average glucose Bld gHb Est-mCnc: 131
Hemoglobin A1C: 6.2 % — AB (ref 4.0–5.6)

## 2018-10-04 MED ORDER — BUPROPION HCL ER (XL) 150 MG PO TB24: 150.0000 mg | ORAL_TABLET | Freq: Every day | ORAL | 0 refills | Status: DC

## 2018-10-04 NOTE — Patient Instructions (Addendum)
   Let me know if you need to increase bupropion to 300mg  a day next month  . I recommend that you get a flu vaccine this year. Please call our office at [phone] at your earliest convenience to schedule a flu shot.

## 2018-10-04 NOTE — Progress Notes (Signed)
Patient: Laura Nielsen Female    DOB: 12-05-1955   62 y.o.   MRN: [id number] Visit Date: 10/04/2018  Today's Provider: Lelon Huh, MD   Chief Complaint  Patient presents with  . Diabetes  . Hyperlipidemia  . Obesity   Subjective:    HPI  Diabetes Mellitus Type II, Follow-up:   Lab Results  Component Value Date   HGBA1C 6.5 (A) 06/03/2018   HGBA1C 6.4 12/12/2017   HGBA1C 6.3 08/30/2017    Last seen for diabetes 4 months ago.  Management since then includes none. She reports good compliance with treatment. She is not having side effects.  Current symptoms include none and have been . Home blood sugar records: n/a  Episodes of hypoglycemia? no   Current Insulin Regimen: none Most Recent Eye Exam: 2019 Weight trend: decreasing steadily Prior visit with dietician: no Current diet: no taste for food lately. Current exercise: none  Pertinent Labs:    Component Value Date/Time   CHOL 114 06/03/2018 0951   TRIG 129 06/03/2018 0951   HDL 31 (L) 06/03/2018 0951   LDLCALC 57 06/03/2018 0951   CREATININE 0.69 06/03/2018 0951   CREATININE 0.60 08/30/2017 0920    Wt Readings from Last 3 Encounters:  10/04/18 257 lb (116.6 kg)  06/03/18 272 lb (123.4 kg)  04/05/18 283 lb (128.4 kg)    ------------------------------------------------------------------------   Morbid Obesity Has been taking the Zempic and it is helping with weight loss. Patient states her appetite has changed and she is not wanting food. Wt Readings from Last 3 Encounters:  10/04/18 257 lb (116.6 kg)  06/03/18 272 lb (123.4 kg)  04/05/18 283 lb (128.4 kg)   Wt Readings from Last 9 Encounters:  10/04/18 257 lb (116.6 kg)  06/03/18 272 lb (123.4 kg)  04/05/18 283 lb (128.4 kg)  02/25/18 279 lb (126.6 kg)  02/05/18 274 lb (124.3 kg)  01/30/18 274 lb (124.3 kg)  01/01/18 268 lb (121.6 kg)  12/12/17 271 lb (122.9 kg)  08/30/17 275 lb (124.7 kg)        Lipid/Cholesterol, Follow-up:     Last seen for this 4 months ago.  Management changes since that visit include none. . Last Lipid Panel:    Component Value Date/Time   CHOL 114 06/03/2018 0951   TRIG 129 06/03/2018 0951   HDL 31 (L) 06/03/2018 0951   CHOLHDL 3.7 06/03/2018 0951   LDLCALC 57 06/03/2018 0951    She reports good compliance with treatment. She is not having side effects.   Weight trend: decreasing steadily Prior visit with dietician: no Current diet: well balanced Current exercise: none  Wt Readings from Last 3 Encounters:  10/04/18 257 lb (116.6 kg)  06/03/18 272 lb (123.4 kg)  04/05/18 283 lb (128.4 kg)    -------------------------------------------------------------------  She also reports she stopped taking bupropion because it made her feel nauseated she states she did stop smoking when she was taking it, and has been more depressed since she stopped. She is interested in getting something to help with the nausea, or trying a different antidepressant.   Allergies  Allergen Reactions  . Atorvastatin Other (See Comments)    Elevated blood sugar Elevated blood sugar  . Omeprazole Nausea And Vomiting     Current Outpatient Medications:  .  albuterol (PROVENTIL HFA) 108 (90 Base) MCG/ACT inhaler, Inhale 2 puffs into the lungs every 6 (six) hours as needed for wheezing or shortness of breath., Disp: 18 g, Rfl:  11 .  allopurinol (ZYLOPRIM) 100 MG tablet, Take 2 tablets (200 mg total) by mouth daily., Disp: 60 tablet, Rfl: 11 .  aspirin 81 MG tablet, Take 81 mg by mouth daily. , Disp: , Rfl:  .  bisoprolol (ZEBETA) 10 MG tablet, Take 1 tablet (10 mg total) by mouth daily., Disp: 30 tablet, Rfl: 5 .  budesonide-formoterol (SYMBICORT) 160-4.5 MCG/ACT inhaler, Inhale 2 puffs into the lungs 2 (two) times daily., Disp: 1 Inhaler, Rfl: 11 .  Cholecalciferol (VITAMIN D3) 2000 units capsule, Take 1 capsule (2,000 Units total) by mouth daily., Disp: 30 capsule, Rfl: PRN .  clopidogrel (PLAVIX) 75  MG tablet, Take 1 tablet (75 mg total) by mouth daily., Disp: 90 tablet, Rfl: 3 .  Cyanocobalamin (RA VITAMIN B-12 TR) 1000 MCG TBCR, Take 2 tablets by mouth daily. , Disp: , Rfl:  .  gabapentin (NEURONTIN) 300 MG capsule, Take 300 mg by mouth 3 (three) times daily., Disp: , Rfl:  .  Ibuprofen 200 MG CAPS, Take by mouth. , Disp: , Rfl:  .  lisinopril-hydrochlorothiazide (PRINZIDE,ZESTORETIC) 20-12.5 MG tablet, Take 1 tablet by mouth daily., Disp: 90 tablet, Rfl: 4 .  loratadine (CLARITIN) 10 MG tablet, Take 10 mg by mouth daily as needed. , Disp: , Rfl:  .  OZEMPIC, 0.25 OR 0.5 MG/DOSE, 2 MG/1.5ML SOPN, Inject 0.5 mg into the skin once a week., Disp: 1.5 mL, Rfl: 2 .  rosuvastatin (CRESTOR) 20 MG tablet, Take 1 tablet (20 mg total) by mouth daily., Disp: 30 tablet, Rfl: 12 .  vitamin C (ASCORBIC ACID) 500 MG tablet, Take 500 mg by mouth. , Disp: , Rfl:  .  oxyCODONE (ROXICODONE) 5 MG immediate release tablet, Take 1 tablet (5 mg total) by mouth every 6 (six) hours as needed for severe pain., Disp: 15 tablet, Rfl: 0  Review of Systems  Constitutional: Positive for appetite change.  HENT: Negative.   Respiratory: Negative.   Cardiovascular: Negative.   Gastrointestinal: Negative.   Genitourinary: Negative.   Allergic/Immunologic: Negative.   Neurological: Negative.     Social History   Tobacco Use  . Smoking status: Current Every Day Smoker    Packs/day: 1.00    Years: 44.00    Pack years: 44.00    Types: Cigarettes    Last attempt to quit: 01/18/2018    Years since quitting: 0.7  . Smokeless tobacco: Never Used  . Tobacco comment: previously smoked over 2 ppd. Quit 01/2018  Substance Use Topics  . Alcohol use: Yes    Comment: Rarely   Objective:   BP 124/82 (BP Location: Left Arm, Patient Position: Sitting, Cuff Size: Normal)   Pulse 78   Temp 98 F (36.7 C) (Oral)   Wt 257 lb (116.6 kg)   SpO2 98%   BMI 38.51 kg/m  Vitals:   10/04/18 0855  BP: 124/82  Pulse: 78  Temp:  98 F (36.7 C)  TempSrc: Oral  SpO2: 98%  Weight: 257 lb (116.6 kg)     Physical Exam  General Appearance:    Alert, cooperative, no distress, obese  Eyes:    PERRL, conjunctiva/corneas clear, EOM's intact       Lungs:     Clear to auscultation bilaterally, respirations unlabored  Heart:    Regular rate and rhythm  Neurologic:   Awake, alert, oriented x 3. No apparent focal neurological           defect.       Results for orders placed or  performed in visit on 10/04/18  POCT glycosylated hemoglobin (Hb A1C)  Result Value Ref Range   Hemoglobin A1C 6.2 (A) 4.0 - 5.6 %   HbA1c POC (<> result, manual entry)     HbA1c, POC (prediabetic range)     HbA1c, POC (controlled diabetic range)     Est. average glucose Bld gHb Est-mCnc 131        Assessment & Plan:     1. Type 2 diabetes mellitus without complication, without long-term current use of insulin (HCC) Well controlled.  Continue current medications.   - POCT glycosylated hemoglobin (Hb A1C)  2. Unspecified inflammatory spondylopathy, lumbar region [facility]) Encouraged her to exercise more to help continue wight weight loss and may help improve chronic pain   3. Smoking greater than 30 pack years Was able to quit when taking bupropion which will restart as below.   4. Recurrent major depressive disorder, in partial remission (HCC) bupropion was effective but 150 BID was causing nausea. Will restart but on XL formulation at 150 a day. Consider increase to 300 a day after a month.   Return in about 4 months (around 02/02/2019).        Lelon Huh, MD  [facility]

## 2018-10-28 ENCOUNTER — Telehealth: Payer: Self-pay | Admitting: Family Medicine

## 2018-10-28 DIAGNOSIS — F3341 Major depressive disorder, recurrent, in partial remission: Secondary | ICD-10-CM

## 2018-10-28 DIAGNOSIS — F17210 Nicotine dependence, cigarettes, uncomplicated: Secondary | ICD-10-CM

## 2018-10-28 MED ORDER — BUPROPION HCL ER (XL) 300 MG PO TB24: 300.0000 mg | ORAL_TABLET | Freq: Every day | ORAL | 4 refills | Status: DC

## 2018-10-28 NOTE — Telephone Encounter (Signed)
Patient tolerating medication well, but would like it to be increased.

## 2018-10-28 NOTE — Telephone Encounter (Signed)
Please check with patient and see how she is doing since change bupropion to 150mg  XL. If tolerating well we can increase to 300, or if it is working well enough then we can refill the 150mg .

## 2018-12-05 ENCOUNTER — Other Ambulatory Visit: Payer: Self-pay | Admitting: Family Medicine

## 2018-12-05 DIAGNOSIS — I10 Essential (primary) hypertension: Secondary | ICD-10-CM

## 2018-12-16 ENCOUNTER — Other Ambulatory Visit: Payer: Self-pay | Admitting: Family Medicine

## 2018-12-16 DIAGNOSIS — Z1231 Encounter for screening mammogram for malignant neoplasm of breast: Secondary | ICD-10-CM

## 2018-12-24 ENCOUNTER — Encounter: Payer: Self-pay | Admitting: Physician Assistant

## 2018-12-24 ENCOUNTER — Ambulatory Visit (INDEPENDENT_AMBULATORY_CARE_PROVIDER_SITE_OTHER): Payer: Medicare Other | Admitting: Physician Assistant

## 2018-12-24 VITALS — BP 123/76 | HR 74 | Temp 97.6°F | Resp 16 | Wt 252.8 lb

## 2018-12-24 DIAGNOSIS — J441 Chronic obstructive pulmonary disease with (acute) exacerbation: Secondary | ICD-10-CM

## 2018-12-24 MED ORDER — PREDNISONE 20 MG PO TABS: 20.0000 mg | ORAL_TABLET | Freq: Every day | ORAL | 0 refills | Status: AC

## 2018-12-24 MED ORDER — DOXYCYCLINE HYCLATE 100 MG PO TABS: 100.0000 mg | ORAL_TABLET | Freq: Two times a day (BID) | ORAL | 0 refills | Status: AC

## 2018-12-24 NOTE — Patient Instructions (Signed)
Chronic Obstructive Pulmonary Disease Exacerbation Chronic obstructive pulmonary disease (COPD) is a long-term (chronic) lung problem. In COPD, the flow of air from the lungs is limited. COPD exacerbations are times that breathing gets worse and you need more than your normal treatment. Without treatment, they can be life threatening. If they happen often, your lungs can become more damaged. If your COPD gets worse, your doctor may treat you with:  Medicines.  Oxygen.  Different ways to clear your airway, such as using a mask. Follow these instructions at home: Medicines  Take over-the-counter and prescription medicines only as told by your doctor.  If you take an antibiotic or steroid medicine, do not stop taking the medicine even if you start to feel better.  Keep up with shots (vaccinations) as told by your doctor. Be sure to get a yearly (annual) flu shot. Lifestyle  Do not smoke. If you need help quitting, ask your doctor.  Eat healthy foods.  Exercise regularly.  Get plenty of sleep.  Avoid tobacco smoke and other things that can bother your lungs.  Wash your hands often with soap and water. This will help keep you from getting an infection. If you cannot use soap and water, use hand sanitizer.  During flu season, avoid areas that are crowded with people. General instructions  Drink enough fluid to keep your pee (urine) clear or pale yellow. Do not do this if your doctor has told you not to.  Use a cool mist machine (vaporizer).  If you use oxygen or a machine that turns medicine into a mist (nebulizer), continue to use it as told.  Follow all instructions for rehabilitation. These are steps you can take to make your body work better.  Keep all follow-up visits as told by your doctor. This is important. Contact a doctor if:  Your COPD symptoms get worse than normal. Get help right away if:  You are short of breath and it gets worse.  You have trouble  talking.  You have chest pain.  You cough up blood.  You have a fever.  You keep throwing up (vomiting).  You feel weak or you pass out (faint).  You feel confused.  You are not able to sleep because of your symptoms.  You are not able to do daily activities. Summary  COPD exacerbations are times that breathing gets worse and you need more treatment than normal.  COPD exacerbations can be very serious and may cause your lungs to become more damaged.  Do not smoke. If you need help quitting, ask your doctor.  Stay up-to-date on your shots. Get a flu shot every year. This information is not intended to replace advice given to you by your health care provider. Make sure you discuss any questions you have with your health care provider. Document Released: 11/09/2011 Document Revised: 12/25/2016 Document Reviewed: 12/25/2016 Elsevier Interactive Patient Education  2019 [employer].

## 2018-12-24 NOTE — Progress Notes (Signed)
Patient: Laura Nielsen Female    DOB: [birth year]   63 y.o.   MRN: [id number] Visit Date: 12/24/2018  Today's Provider: Trinna Post, PA-C   Chief Complaint  Patient presents with  . URI   Subjective:     HPI Upper Respiratory Infection: Patient with a history of COPD and currently smoking complains of symptoms of a URI. Symptoms include congestion and cough. Onset of symptoms was 2 day ago, gradually worsening since that time. She also c/o congestion and productive cough with  yellow colored sputum for the past 1 day .  She is drinking plenty of fluids. Evaluation to date: none. Treatment to date: cough suppressants.    Allergies  Allergen Reactions  . Atorvastatin Other (See Comments)    Elevated blood sugar Elevated blood sugar  . Omeprazole Nausea And Vomiting     Current Outpatient Medications:  .  albuterol (PROVENTIL HFA) 108 (90 Base) MCG/ACT inhaler, Inhale 2 puffs into the lungs every 6 (six) hours as needed for wheezing or shortness of breath., Disp: 18 g, Rfl: 11 .  allopurinol (ZYLOPRIM) 100 MG tablet, Take 2 tablets (200 mg total) by mouth daily., Disp: 60 tablet, Rfl: 11 .  aspirin 81 MG tablet, Take 81 mg by mouth daily. , Disp: , Rfl:  .  bisoprolol (ZEBETA) 10 MG tablet, Take 1 tablet (10 mg total) by mouth daily., Disp: 30 tablet, Rfl: 3 .  budesonide-formoterol (SYMBICORT) 160-4.5 MCG/ACT inhaler, Inhale 2 puffs into the lungs 2 (two) times daily., Disp: 1 Inhaler, Rfl: 11 .  buPROPion (WELLBUTRIN XL) 300 MG 24 hr tablet, Take 1 tablet (300 mg total) by mouth daily., Disp: 30 tablet, Rfl: 4 .  Cholecalciferol (VITAMIN D3) 2000 units capsule, Take 1 capsule (2,000 Units total) by mouth daily., Disp: 30 capsule, Rfl: PRN .  clopidogrel (PLAVIX) 75 MG tablet, Take 1 tablet (75 mg total) by mouth daily., Disp: 90 tablet, Rfl: 3 .  gabapentin (NEURONTIN) 300 MG capsule, Take 1 capsule (300 mg total) by mouth 3 (three) times daily., Disp: 90 capsule, Rfl:  3 .  Ibuprofen 200 MG CAPS, Take by mouth. , Disp: , Rfl:  .  lisinopril-hydrochlorothiazide (PRINZIDE,ZESTORETIC) 20-12.5 MG tablet, Take 1 tablet by mouth daily., Disp: 90 tablet, Rfl: 4 .  loratadine (CLARITIN) 10 MG tablet, Take 10 mg by mouth daily as needed. , Disp: , Rfl:  .  OZEMPIC, 0.25 OR 0.5 MG/DOSE, 2 MG/1.5ML SOPN, Inject 0.5 mg into the skin once a week., Disp: 1.5 mL, Rfl: 2 .  rosuvastatin (CRESTOR) 20 MG tablet, Take 1 tablet (20 mg total) by mouth daily., Disp: 30 tablet, Rfl: 12 .  vitamin C (ASCORBIC ACID) 500 MG tablet, Take 500 mg by mouth. , Disp: , Rfl:  .  Cyanocobalamin (RA VITAMIN B-12 TR) 1000 MCG TBCR, Take 2 tablets by mouth daily. , Disp: , Rfl:  .  oxyCODONE (ROXICODONE) 5 MG immediate release tablet, Take 1 tablet (5 mg total) by mouth every 6 (six) hours as needed for severe pain., Disp: 15 tablet, Rfl: 0  Review of Systems  HENT: Positive for congestion.   Respiratory: Positive for cough, shortness of breath and wheezing.     Social History   Tobacco Use  . Smoking status: Current Every Day Smoker    Packs/day: 1.00    Years: 44.00    Pack years: 44.00    Types: Cigarettes    Last attempt to quit: 01/18/2018  Years since quitting: 0.9  . Smokeless tobacco: Never Used  . Tobacco comment: previously smoked over 2 ppd. Quit 01/2018  Substance Use Topics  . Alcohol use: Yes    Comment: Rarely      Objective:   BP 123/76 (BP Location: Left Arm, Patient Position: Sitting, Cuff Size: Large)   Pulse 74   Temp 97.6 F (36.4 C) (Oral)   Resp 16   Wt 252 lb 12.8 oz (114.7 kg)   SpO2 96%   BMI 37.88 kg/m  Vitals:   12/24/18 1224  BP: 123/76  Pulse: 74  Resp: 16  Temp: 97.6 F (36.4 C)  TempSrc: Oral  SpO2: 96%  Weight: 252 lb 12.8 oz (114.7 kg)     Physical Exam Constitutional:      Appearance: Normal appearance.  HENT:     Right Ear: Tympanic membrane and ear canal normal.     Left Ear: Tympanic membrane and ear canal normal.      Mouth/Throat:     Mouth: Mucous membranes are moist.     Pharynx: Oropharynx is clear.  Cardiovascular:     Rate and Rhythm: Normal rate and regular rhythm.     Heart sounds: Normal heart sounds.  Pulmonary:     Breath sounds: Wheezing and rhonchi present.  Skin:    General: Skin is warm and dry.  Neurological:     Mental Status: She is alert.  Psychiatric:        Mood and Affect: Mood normal.        Behavior: Behavior normal.         Assessment & Plan    1. COPD exacerbation (HCC)  - predniSONE (DELTASONE) 20 MG tablet; Take 1 tablet (20 mg total) by mouth daily with breakfast for 5 days.  Dispense: 5 tablet; Refill: 0 - doxycycline (VIBRA-TABS) 100 MG tablet; Take 1 tablet (100 mg total) by mouth 2 (two) times daily for 7 days.  Dispense: 14 tablet; Refill: 0  Return if symptoms worsen or fail to improve.  The entirety of the information documented in the History of Present Illness, Review of Systems and Physical Exam were personally obtained by me. Portions of this information were initially documented by Lynford Humphrey, CMA and reviewed by me for thoroughness and accuracy.       Trinna Post, PA-C  [facility]

## 2018-12-27 ENCOUNTER — Telehealth: Payer: Self-pay

## 2018-12-27 NOTE — Telephone Encounter (Signed)
Call pt regarding lung screening. PT is a current smoker, smoking about 1 pack per day. Pt would like scan any morning on any day. The only day pt isn't available are Fb 4th 7th nor 11 th. Pt has no new health issues at this time.

## 2018-12-28 ENCOUNTER — Encounter: Payer: Self-pay | Admitting: *Deleted

## 2018-12-28 ENCOUNTER — Telehealth: Payer: Self-pay | Admitting: *Deleted

## 2018-12-28 DIAGNOSIS — Z122 Encounter for screening for malignant neoplasm of respiratory organs: Secondary | ICD-10-CM

## 2018-12-28 NOTE — Telephone Encounter (Signed)
Patient has been notified that the annual lung cancer screening low dose CT scan is due currently or will be in the near future.  Confirmed that the patient is within the age range of 48-80, and asymptomatic, and currently exhibits no signs or symptoms of lung cancer.  Patient denies illness that would prevent curative treatment for lung cancer if found.  Verified smoking history, current smoker 1 ppd with 67pkyr history.  The shared decision making visit was completed on 01-01-2018.  Patient is agreeable for the CT scan to be scheduled.  Will call patient back with date and time of appointment.

## 2018-12-31 ENCOUNTER — Telehealth: Payer: Self-pay | Admitting: *Deleted

## 2018-12-31 NOTE — Telephone Encounter (Signed)
Called pt to inform her of her appt for ldct screening this Thursday 01/02/2019 here @ [facility] @ 9:40am.  Patient reports that she cannot do this time, sent back to scheduling.

## 2018-12-31 NOTE — Telephone Encounter (Signed)
CALLED pt to inform her of her appt for ldct screening on Wednesday 01/08/2019 here @ [facility] @ 10:10am,  Voiced understanding.

## 2019-01-02 ENCOUNTER — Ambulatory Visit: Payer: Federal, State, Local not specified - PPO

## 2019-01-07 ENCOUNTER — Ambulatory Visit (HOSPITAL_BASED_OUTPATIENT_CLINIC_OR_DEPARTMENT_OTHER): Payer: Medicare Other | Admitting: Nurse Practitioner

## 2019-01-07 ENCOUNTER — Other Ambulatory Visit: Payer: Self-pay

## 2019-01-07 ENCOUNTER — Encounter: Payer: Self-pay | Admitting: Nurse Practitioner

## 2019-01-07 ENCOUNTER — Ambulatory Visit
Admission: RE | Admit: 2019-01-07 | Discharge: 2019-01-07 | Disposition: A | Discharge status: Home / Self Care | Payer: Medicare Other | Source: Ambulatory Visit | Attending: Family Medicine | Admitting: Family Medicine

## 2019-01-07 VITALS — BP 111/90 | HR 79 | Temp 98.1°F | Resp 18 | Ht 68.0 in | Wt 255.0 lb

## 2019-01-07 DIAGNOSIS — R51 Headache: Secondary | ICD-10-CM | POA: Diagnosis not present

## 2019-01-07 DIAGNOSIS — Z1231 Encounter for screening mammogram for malignant neoplasm of breast: Secondary | ICD-10-CM | POA: Diagnosis not present

## 2019-01-07 DIAGNOSIS — M47816 Spondylosis without myelopathy or radiculopathy, lumbar region: Secondary | ICD-10-CM | POA: Diagnosis not present

## 2019-01-07 DIAGNOSIS — G4486 Cervicogenic headache: Secondary | ICD-10-CM

## 2019-01-07 DIAGNOSIS — M4722 Other spondylosis with radiculopathy, cervical region: Secondary | ICD-10-CM

## 2019-01-07 DIAGNOSIS — G894 Chronic pain syndrome: Secondary | ICD-10-CM

## 2019-01-07 MED ORDER — OXYCODONE HCL 5 MG PO TABS: 5.0000 mg | ORAL_TABLET | Freq: Four times a day (QID) | ORAL | 0 refills | Status: DC | PRN

## 2019-01-07 MED ORDER — GABAPENTIN 300 MG PO CAPS: ORAL_CAPSULE | ORAL | 5 refills | Status: DC

## 2019-01-07 NOTE — Progress Notes (Signed)
Patient's Name: Laura Nielsen  MRN: [id number]  Referring Provider: Birdie Sons, MD  DOB: [birth year]  PCP: Birdie Sons, MD  DOS: 01/07/2019  Note by: Vevelyn Francois NP  Service setting: Ambulatory outpatient  Specialty: Interventional Pain Management  Location: [facility] (AMB) Pain Management Facility    Patient type: Established    Primary Reason(s) for Visit: Encounter for prescription drug management. (Level of risk: moderate)  CC: Back Pain (lower)  HPI  Laura Nielsen is a 63 y.o. year old, female patient, who comes today for a medication management evaluation. She has Allergic rhinitis; Aortic heart valve narrowing; Arthritis; Airway hyperreactivity; CAD in native artery; Carotid artery narrowing; Claudication ([place]); CAFL (chronic airflow limitation) ([place]); Smoking greater than 30 pack years; Narrowing of intervertebral disc space; Clinical depression; Diabetes mellitus, type 2 ([place]); Acid reflux; Hypercholesteremia; Benign essential HTN; Urinary incontinence; Malaise and fatigue; Pins and needles sensation; Obstructive apnea; Morbid obesity ([place]); Arthritis, degenerative; History of abnormal cervical Papanicolaou smear; Peripheral blood vessel disorder ([place]); B12 deficiency; Vitamin D insufficiency; AAA (abdominal aortic aneurysm) without rupture ([place]); Ankle pain; Gout; Atherosclerosis of native arteries of extremity with intermittent claudication ([place]); Chronic pain syndrome; Long term prescription opiate use; Opiate use; Chronic low back pain (Location of Primary Source of Pain) (Bilateral) (L>R); Chronic neck pain (Location of Secondary source of pain) (Bilateral) (R>L); Cervicogenic headache (Right); Chronic upper back pain (Location of Tertiary source of pain) (Bilateral) (R>L); Chronic hip pain (Bilateral) (L>R); Osteoarthritis of hip (Bilateral) (L>R); Cervical central spinal stenosis; Cervical spondylosis with radiculopathy (Right) (C5); Lumbar spondylosis; Lumbar facet syndrome (Bilateral)  (L>R); Lumbar facet hypertrophy (multilevel) (Bilateral); Long term current use of anticoagulant therapy; Neurogenic pain; Musculoskeletal pain; Abnormal MRI, lumbar spine (03/28/2017); Abnormal MRI, cervical spine (03/28/2017); and Unspecified inflammatory spondylopathy, lumbar region [facility]) on their problem list. Her primarily concern today is the Back Pain (lower)  Pain Assessment: Location: Lower, Right, Left Back Radiating:  hips bilateral,down back of legs bilateral right side worse Onset: More than a month ago Duration: Chronic pain Quality: Aching, Constant, Discomfort Severity: 6 (Gets better as day goes on)/10 (subjective, self-reported pain score)  Note: Reported level is compatible with observation.                          Effect on ADL: hard to get in shower, standing, walking, getting out of bed, sleeping Timing: Constant Modifying factors: streching, hot, cold, cremes, adjustable bed BP: 111/90  HR: 79  Laura Nielsen was last scheduled for an appointment on Visit date not found for medication management. During today's appointment we reviewed Laura Nielsen's chronic pain status, as well as her outpatient medication regimen. She has right leg pain and weakness. She feels like the pain goes to the back of her knee. She is only using the opioid when the pain does not ease off as the day progresses. She has increased use in the last week. She admits that she is uses [employer] to avoid the use of the Oxycodone. Her last Lumbar facet was not covered under her insurance.   The patient  reports no history of drug use. Her body mass index is 38.77 kg/m.  Further details on both, my assessment(s), as well as the proposed treatment plan, please see below.  Controlled Substance Pharmacotherapy Assessment REMS (Risk Evaluation and Mitigation Strategy)  Analgesic: Oxycodone 12m daily prn MME/day: 7.5 mg/day.  GIgnatius Specking RN  01/07/2019  8:40 AM  Sign when Signing Visit  Nursing Pain Medication  Assessment:  Safety precautions to be maintained throughout the outpatient stay will include: orient to surroundings, keep bed in low position, maintain call bell within reach at all times, provide assistance with transfer out of bed and ambulation.  Medication Inspection Compliance: Pill count conducted under aseptic conditions, in front of the patient. Neither the pills nor the bottle was removed from the patient's sight at any time. Once count was completed pills were immediately returned to the patient in their original bottle.  Medication: Oxycodone IR Pill/Patch Count: 1 of 15 pills remain Pill/Patch Appearance: Markings consistent with prescribed medication Bottle Appearance: Standard pharmacy container. Clearly labeled. Filled Date: 3 / 13 / 2019 Last Medication intake:  5 days ago   Pharmacokinetics: Liberation and absorption (onset of action): WNL Distribution (time to peak effect): WNL Metabolism and excretion (duration of action): WNL         Pharmacodynamics: Desired effects: Analgesia: Laura Nielsen reports >50% benefit. Functional ability: Patient reports that medication allows her to accomplish basic ADLs Clinically meaningful improvement in function (CMIF): Sustained CMIF goals met Perceived effectiveness: Described as relatively effective, allowing for increase in activities of daily living (ADL) Undesirable effects: Side-effects or Adverse reactions: None reported Monitoring: [place] PMP: Online review of the past 35-monthperiod conducted. Compliant with practice rules and regulations Last UDS on record: Summary  Date Value Ref Range Status  03/08/2017 FINAL  Final    Comment:    ==================================================================== TOXASSURE COMP DRUG ANALYSIS,UR ==================================================================== Test                             Result       Flag       Units Drug Present and Declared for Prescription Verification    Gabapentin                     PRESENT      EXPECTED   Amitriptyline                  PRESENT      EXPECTED   Nortriptyline                  PRESENT      EXPECTED    Nortriptyline is an expected metabolite of amitriptyline.   Salicylate                     PRESENT      EXPECTED Drug Absent but Declared for Prescription Verification   Oxycodone                      Not Detected UNEXPECTED ng/mg creat   Bupropion                      Not Detected UNEXPECTED   Acetaminophen                  Not Detected UNEXPECTED    Acetaminophen, as indicated in the declared medication list, is    not always detected even when used as directed.   Ibuprofen                      Not Detected UNEXPECTED    Ibuprofen, as indicated in the declared medication list, is not    always detected even when used as directed. ==================================================================== Test  Result    Flag   Units      Ref Range   Creatinine              160              mg/dL      >=20 ==================================================================== Declared Medications:  The flagging and interpretation on this report are based on the  following declared medications.  Unexpected results may arise from  inaccuracies in the declared medications.  **Note: The testing scope of this panel includes these medications:  Amitriptyline (Elavil)  Bupropion (Wellbutrin)  Gabapentin  Oxycodone (Oxycodone Acetaminophen)  **Note: The testing scope of this panel does not include small to  moderate amounts of these reported medications:  Acetaminophen (Oxycodone Acetaminophen)  Aspirin  Ibuprofen  **Note: The testing scope of this panel does not include following  reported medications:  Albuterol  Allopurinol (Zyloprim)  Bisoprolol (Zebeta)  Clopidogrel (Plavix)  Cyanocobalamin  Lisinopril  Loratadine (Claritin)  Lovastatin (Mevacor)  Triamcinolone (Kenalog)  Vitamin C  Vitamin  D ==================================================================== For clinical consultation, please call [phone]. ====================================================================    UDS interpretation: Compliant          Medication Assessment Form: Reviewed. Patient indicates being compliant with therapy Treatment compliance: Compliant Risk Assessment Profile: Aberrant behavior: See prior evaluations. None observed or detected today Comorbid factors increasing risk of overdose: See prior notes. No additional risks detected today Opioid risk tool (ORT) (Total Score): 4 Personal History of Substance Abuse (SUD-Substance use disorder):  Alcohol: Negative  Illegal Drugs: Negative  Rx Drugs: Negative  ORT Risk Level calculation: Moderate Risk Risk of substance use disorder (SUD): Low Opioid Risk Tool - 01/07/19 0839      Family History of Substance Abuse   Alcohol  Positive Female    Illegal Drugs  Negative    Rx Drugs  Negative      Personal History of Substance Abuse   Alcohol  Negative    Illegal Drugs  Negative    Rx Drugs  Negative      Age   Age between 72-45 years   No      Psychological Disease   Psychological Disease  Positive    ADD  Negative    OCD  Negative    Bipolar  Negative    Schizophrenia  Negative    Depression  Positive      Total Score   Opioid Risk Tool Scoring  4    Opioid Risk Interpretation  Moderate Risk      ORT Scoring interpretation table:  Score <3 = Low Risk for SUD  Score between 4-7 = Moderate Risk for SUD  Score >8 = High Risk for Opioid Abuse   Risk Mitigation Strategies:  Patient Counseling: Covered Patient-Prescriber Agreement (PPA): Present and active  Notification to other healthcare providers: Done  Pharmacologic Plan: No change in therapy, at this time.             Laboratory Chemistry  Inflammation Markers (CRP: Acute Phase) (ESR: Chronic Phase) Lab Results  Component Value Date   CRP <0.8 03/08/2017    ESRSEDRATE 5 03/08/2017                         Rheumatology Markers Lab Results  Component Value Date   LABURIC 5.2 08/30/2017                        Renal Function  Markers Lab Results  Component Value Date   BUN 10 06/03/2018   CREATININE 0.69 06/03/2018   BCR 14 06/03/2018   GFRAA 109 06/03/2018   GFRNONAA 94 06/03/2018                             Hepatic Function Markers Lab Results  Component Value Date   AST 31 06/03/2018   ALT 24 06/03/2018   ALBUMIN 4.2 06/03/2018   ALKPHOS 98 06/03/2018   LIPASE 140 08/26/2012                        Electrolytes Lab Results  Component Value Date   NA 141 06/03/2018   K 3.8 06/03/2018   CL 104 06/03/2018   CALCIUM 9.6 06/03/2018   MG 1.8 03/08/2017   PHOS 4.0 05/18/2016                        Neuropathy Markers Lab Results  Component Value Date   VITAMINB12 473 03/08/2017   HGBA1C 6.2 (A) 10/04/2018                        CNS Tests No results found for: COLORCSF, APPEARCSF, RBCCOUNTCSF, WBCCSF, POLYSCSF, LYMPHSCSF, EOSCSF, PROTEINCSF, GLUCCSF, JCVIRUS, CSFOLI, IGGCSF                      Bone Pathology Markers Lab Results  Component Value Date   VD25OH 21.9 (L) 01/09/2017   25OHVITD1 24 (L) 03/08/2017   25OHVITD2 8.2 03/08/2017   25OHVITD3 16 03/08/2017                         Coagulation Parameters Lab Results  Component Value Date   INR 0.9 04/26/2017   LABPROT 9.9 04/26/2017   APTT 28.2 03/28/2013   PLT 245 08/30/2017                        Cardiovascular Markers Lab Results  Component Value Date   BNP 43 08/30/2017   HGB 15.1 08/30/2017   HCT 43.9 08/30/2017                         CA Markers No results found for: CEA, CA125, LABCA2                      Endocrine Markers Lab Results  Component Value Date   TSH 1.86 08/30/2017                        Note: Lab results reviewed.  Recent Diagnostic Imaging Results  VAS [place] ABI WITH/WO TBI LOWER EXTREMITY DOPPLER STUDY  Indications:  ASO bilateral lower extremities.  Examination Guidelines: A complete evaluation includes B-mode imaging, spectral doppler, color doppler, and power doppler as needed of all accessible portions of each vessel. Bilateral testing is considered an integral part of a complete examination. Limited examinations for reoccurring indications may be performed as noted.    ABI Findings: +---------+------------------+-----+--------+--------+ Right    Rt Pressure (mmHg)IndexWaveformComment  +---------+------------------+-----+--------+--------+ Brachial 132                                     +---------+------------------+-----+--------+--------+  ATA      119               0.90                  +---------+------------------+-----+--------+--------+ PTA      155               1.17                  +---------+------------------+-----+--------+--------+ Great Toe114               0.86                  +---------+------------------+-----+--------+--------+  +---------+------------------+-----+--------+-------+ Left     Lt Pressure (mmHg)IndexWaveformComment +---------+------------------+-----+--------+-------+ Brachial 123                                    +---------+------------------+-----+--------+-------+ ATA      136               1.03                 +---------+------------------+-----+--------+-------+ PTA      153               1.16                 +---------+------------------+-----+--------+-------+ Great Toe100               0.76                 +---------+------------------+-----+--------+-------+  +-------+-----------+-----------+------------+------------+ ABI/TBIToday's ABIToday's TBIPrevious ABIPrevious TBI +-------+-----------+-----------+------------+------------+ Right  1.17       0.86       0.97        0.77         +-------+-----------+-----------+------------+------------+ Left   1.16       0.76        0.94        0.90         +-------+-----------+-----------+------------+------------+  Right ABIs appear increased. Left ABIs appear increased.   Final Interpretation: Right: Resting right ankle-brachial index is within normal range. No evidence of significant right lower extremity arterial disease. The right toe-brachial index is normal. Left: Resting left ankle-brachial index indicates noncompressible left lower extremity arteries.The left toe-brachial index is normal.   *See table(s) above for measurements and observations.    Electronically signed by Hortencia Pilar on 03/10/2018 at 9:16:08 PM.  Final  VAS US AORTA/IVC/ILIACS ABDOMINAL AORTA STUDY  Vascular Interventions: 03/27/13: Repair of aortic stenosis using Endologix                         Powerlink endograft.   Limitations: Air/bowel gas and obesity.    Examination Guidelines: A complete evaluation includes B-mode imaging, spectral doppler, color doppler, and power doppler as needed of all accessible portions of each vessel. Bilateral testing is considered an integral part of a complete examination. Limited examinations for reoccurring indications may be performed as noted.    Abdominal Aorta Findings: +-------------+-------+----------+----------+---------+--------+--------+ Location     AP (cm)Trans (cm)PSV (cm/s)Waveform ThrombusComments +-------------+-------+----------+----------+---------+--------+--------+ Mid                           74        triphasic                 +-------------+-------+----------+----------+---------+--------+--------+  RT CIA Prox                   119       triphasic                 +-------------+-------+----------+----------+---------+--------+--------+ RT CIA Mid                    94        triphasic                 +-------------+-------+----------+----------+---------+--------+--------+ RT CIA Distal                 109       triphasic                  +-------------+-------+----------+----------+---------+--------+--------+ RT EIA Prox                   119       triphasic                 +-------------+-------+----------+----------+---------+--------+--------+ RT EIA Mid                    131       triphasic                 +-------------+-------+----------+----------+---------+--------+--------+ RT EIA Distal                 119       triphasic                 +-------------+-------+----------+----------+---------+--------+--------+ LT CIA Prox                   95        triphasic                 +-------------+-------+----------+----------+---------+--------+--------+ LT CIA Mid                    83        triphasic                 +-------------+-------+----------+----------+---------+--------+--------+ LT CIA Distal                 109       triphasic                 +-------------+-------+----------+----------+---------+--------+--------+ LT EIA Prox                   119       triphasic                 +-------------+-------+----------+----------+---------+--------+--------+ LT EIA Mid                    131       triphasic                 +-------------+-------+----------+----------+---------+--------+--------+ LT EIA Distal                 100       triphasic                 +-------------+-------+----------+----------+---------+--------+--------+  Extremely limited visualization of the abdominal aorta and bilateral iliac arteries due to patient's body habitus, and overlying bowel gas.   Final Interpretation: Stenosis: Evidence of a patent abdominal aorta and bilateral iliac arteries without evidence of hemodynamically significant stenosis. ** Limited visualization by ultrasound**   *  See table(s) above for measurements and observations.   Electronically signed by Hortencia Pilar on 03/10/2018 at 9:16:00 PM.   Final   Complexity Note:  Imaging results reviewed. Results shared with Ms. Reifsteck, using Layman's terms.                         Meds   Current Outpatient Medications:  .  albuterol (PROVENTIL HFA) 108 (90 Base) MCG/ACT inhaler, Inhale 2 puffs into the lungs every 6 (six) hours as needed for wheezing or shortness of breath., Disp: 18 g, Rfl: 11 .  allopurinol (ZYLOPRIM) 100 MG tablet, Take 2 tablets (200 mg total) by mouth daily., Disp: 60 tablet, Rfl: 11 .  aspirin 81 MG tablet, Take 81 mg by mouth daily. , Disp: , Rfl:  .  bisoprolol (ZEBETA) 10 MG tablet, Take 1 tablet (10 mg total) by mouth daily., Disp: 30 tablet, Rfl: 3 .  budesonide-formoterol (SYMBICORT) 160-4.5 MCG/ACT inhaler, Inhale 2 puffs into the lungs 2 (two) times daily., Disp: 1 Inhaler, Rfl: 11 .  buPROPion (WELLBUTRIN XL) 300 MG 24 hr tablet, Take 1 tablet (300 mg total) by mouth daily., Disp: 30 tablet, Rfl: 4 .  Cholecalciferol (VITAMIN D3) 2000 units capsule, Take 1 capsule (2,000 Units total) by mouth daily., Disp: 30 capsule, Rfl: PRN .  clopidogrel (PLAVIX) 75 MG tablet, Take 1 tablet (75 mg total) by mouth daily., Disp: 90 tablet, Rfl: 3 .  Cyanocobalamin (RA VITAMIN B-12 TR) 1000 MCG TBCR, Take 2 tablets by mouth daily. , Disp: , Rfl:  .  gabapentin (NEURONTIN) 300 MG capsule, Take 1 capsule (300 mg total) by mouth 3 (three) times daily., Disp: 90 capsule, Rfl: 5 .  Ibuprofen 200 MG CAPS, Take by mouth. , Disp: , Rfl:  .  lisinopril-hydrochlorothiazide (PRINZIDE,ZESTORETIC) 20-12.5 MG tablet, Take 1 tablet by mouth daily., Disp: 90 tablet, Rfl: 4 .  loratadine (CLARITIN) 10 MG tablet, Take 10 mg by mouth daily as needed. , Disp: , Rfl:  .  OZEMPIC, 0.25 OR 0.5 MG/DOSE, 2 MG/1.5ML SOPN, Inject 0.5 mg into the skin once a week., Disp: 1.5 mL, Rfl: 2 .  rosuvastatin (CRESTOR) 20 MG tablet, Take 1 tablet (20 mg total) by mouth daily., Disp: 30 tablet, Rfl: 12 .  vitamin C (ASCORBIC ACID) 500 MG tablet, Take 500 mg by mouth. , Disp: , Rfl:  .   [START ON 02/06/2019] oxyCODONE (ROXICODONE) 5 MG immediate release tablet, Take 1 tablet (5 mg total) by mouth every 6 (six) hours as needed for up to 30 days for severe pain., Disp: 15 tablet, Rfl: 0 .  oxyCODONE (ROXICODONE) 5 MG immediate release tablet, Take 1 tablet (5 mg total) by mouth every 6 (six) hours as needed for up to 30 days for severe pain., Disp: 15 tablet, Rfl: 0  ROS  Constitutional: Denies any fever or chills Gastrointestinal: No reported hemesis, hematochezia, vomiting, or acute GI distress Musculoskeletal: Denies any acute onset joint swelling, redness, loss of ROM, or weakness Neurological: No reported episodes of acute onset apraxia, aphasia, dysarthria, agnosia, amnesia, paralysis, loss of coordination, or loss of consciousness  Allergies  Ms. Coulibaly is allergic to atorvastatin and omeprazole.  PFSH  Drug: Ms. Stonehouse  reports no history of drug use. Alcohol:  reports current alcohol use. Tobacco:  reports that she has been smoking cigarettes. She has a 66.75 pack-year smoking history. She has never used smokeless tobacco. Medical:  has a past medical history of Allergy,  Arthritis, Asthma, Depression, Diabetes mellitus without complication ([place]), Emphysema of lung ([place]), GERD (gastroesophageal reflux disease), Hyperlipidemia, Hypertension, Osteoporosis, and Tobacco abuse counseling. Surgical: Ms. Tibbitts  has a past surgical history that includes vascular stent (03/28/2011); Cardiac catheterization (3/210); Kidney stone surgery (1999); Vaginal hysterectomy; and Tubal ligation. Family: family history includes Alcohol abuse in her father and sister; Breast cancer (age of onset: 34) in her sister; Cancer (age of onset: 71) in her sister; Coronary artery disease in her mother; Coronary artery disease (age of onset: 43) in her sister; Depression in her father; Heart attack in her mother and sister; Heart attack (age of onset: 5) in her father; Hyperlipidemia in her sister;  Hypertension in her father, mother, and sister.  Constitutional Exam  General appearance: alert, cooperative, in mild distress and morbidly obese Vitals:   01/07/19 0829  BP: 111/90  Pulse: 79  Resp: 18  Temp: 98.1 F (36.7 C)  SpO2: 95%  Weight: 255 lb (115.7 kg)  Height: 5' 8"  (1.727 m)  Psych/Mental status: Alert, oriented x 3 (person, place, & time)       Eyes: PERLA Respiratory: No evidence of acute respiratory distress  Lumbar Spine Area Exam  Skin & Axial Inspection: No masses, redness, or swelling Alignment: Symmetrical Functional ROM: Unrestricted ROM       Stability: No instability detected Muscle Tone/Strength: Functionally intact. No obvious neuro-muscular anomalies detected. Sensory (Neurological): Unimpaired Palpation: No palpable anomalies       Provocative Tests: Hyperextension/rotation test: deferred today       Lumbar quadrant test (Kemp's test): deferred today       Lateral bending test: deferred today       Patrick's Maneuver: deferred today                     Gait & Posture Assessment  Ambulation: Unassisted Gait: Relatively normal for age and body habitus Posture: WNL   Lower Extremity Exam    Side: Right lower extremity  Side: Left lower extremity  Stability: No instability observed          Stability: No instability observed          Skin & Extremity Inspection: Skin color, temperature, and hair growth are WNL. No peripheral edema or cyanosis. No masses, redness, swelling, asymmetry, or associated skin lesions. No contractures.  Skin & Extremity Inspection: Skin color, temperature, and hair growth are WNL. No peripheral edema or cyanosis. No masses, redness, swelling, asymmetry, or associated skin lesions. No contractures.  Functional ROM: Unrestricted ROM                  Functional ROM: Unrestricted ROM                  Muscle Tone/Strength: Functionally intact. No obvious neuro-muscular anomalies detected.  Muscle Tone/Strength: Functionally  intact. No obvious neuro-muscular anomalies detected.  Sensory (Neurological): Referred pain pattern        Sensory (Neurological): Unimpaired            Palpation: No palpable anomalies  Palpation: No palpable anomalies   Assessment  Primary Diagnosis & Pertinent Problem List: The primary encounter diagnosis was Lumbar spondylosis. Diagnoses of Cervical spondylosis with radiculopathy (Right) (C5), Cervicogenic headache (Right), and Chronic pain syndrome were also pertinent to this visit.  Status Diagnosis  Worsening Controlled Controlled 1. Lumbar spondylosis   2. Cervical spondylosis with radiculopathy (Right) (C5)   3. Cervicogenic headache (Right)   4. Chronic pain syndrome  Problems updated and reviewed during this visit: No problems updated. Plan of Care  Pharmacotherapy (Medications Ordered): Meds ordered this encounter  Medications  . DISCONTD: oxyCODONE (ROXICODONE) 5 MG immediate release tablet    Sig: Take 1 tablet (5 mg total) by mouth every 6 (six) hours as needed for up to 30 days for severe pain.    Dispense:  15 tablet    Refill:  0    Fill one day early if pharmacy is closed on scheduled refill date. Do not fill until: 02/05/2018 To last until: 03/07/2018    Order Specific Question:   Supervising Provider    Answer:   Milinda Pointer [phone]  . gabapentin (NEURONTIN) 300 MG capsule    Sig: Take 1 capsule (300 mg total) by mouth 3 (three) times daily.    Dispense:  90 capsule    Refill:  5    Order Specific Question:   Supervising Provider    Answer:   Milinda Pointer [phone]  . oxyCODONE (ROXICODONE) 5 MG immediate release tablet    Sig: Take 1 tablet (5 mg total) by mouth every 6 (six) hours as needed for up to 30 days for severe pain.    Dispense:  15 tablet    Refill:  0    Do not place this medication, or any other prescription from our practice, on "Automatic Refill". Patient may have prescription filled one day early if pharmacy is closed on  scheduled refill date.    Order Specific Question:   Supervising Provider    Answer:   Milinda Pointer [phone]  . oxyCODONE (ROXICODONE) 5 MG immediate release tablet    Sig: Take 1 tablet (5 mg total) by mouth every 6 (six) hours as needed for up to 30 days for severe pain.    Dispense:  15 tablet    Refill:  0    Do not place this medication, or any other prescription from our practice, on "Automatic Refill". Patient may have prescription filled one day early if pharmacy is closed on scheduled refill date.    Order Specific Question:   Supervising Provider    Answer:   Milinda Pointer [[id number]]   New Prescriptions   No medications on file   Medications administered today: Kassidie Hendriks had no medications administered during this visit. Lab-work, procedure(s), and/or referral(s): No orders of the defined types were placed in this encounter.  Imaging and/or referral(s): None  Interventional therapies: Planned, scheduled, and/or pending: Not at this time   Considering: Diagnostic bilateral lumbar facetblock  Possible bilateral lumbar facet RFA Diagnostic right-sided cervical epidural steroid injection Diagnostic bilateral cervical facetblock Possible bilateral cervical facet RFA Diagnostic intra-articular hip joint injection Diagnostic bilateral femoral nerve and obturator nerveblock Possible bilateral femoral nerve and obturator nerve RFA Diagnostic right-sided occipital nerve block Possible right-sided occipital nerve RFA Possible right-sided occipital peripheral nerve stimulator trial   PRN Procedures: To be determined at a later time     Provider-requested follow-up: Return in about 6 months (around 07/08/2019) for MedMgmt.  Future Appointments  Date Time Provider [place]  01/08/2019 10:10 AM [facility]-CT [facility]  02/04/2019  8:40 AM [facility]-NURSE HEALTH ADVISOR [facility] None  02/04/2019  9:00 AM Birdie Sons, MD [facility] None   03/03/2019  7:30 AM [facility] VASC 2 [facility] None  03/03/2019  8:30 AM [facility] VASC 2 [facility] None  03/03/2019  9:00 AM Schnier, Dolores Lory, MD [facility] None  06/30/2019  9:00 AM Vevelyn Francois, NP [facility] None  Primary Care Physician: Birdie Sons, MD Location: [facility] Note by: Vevelyn Francois NP Date: 01/07/2019; Time: 10:27 AM  Pain Score Disclaimer: We use the NRS-11 scale. This is a self-reported, subjective measurement of pain severity with only modest accuracy. It is used primarily to identify changes within a particular patient. It must be understood that outpatient pain scales are significantly less accurate that those used for research, where they can be applied under ideal controlled circumstances with minimal exposure to variables. In reality, the score is likely to be a combination of pain intensity and pain affect, where pain affect describes the degree of emotional arousal or changes in action readiness caused by the sensory experience of pain. Factors such as social and work situation, setting, emotional state, anxiety levels, expectation, and prior pain experience may influence pain perception and show large inter-individual differences that may also be affected by time variables.  Patient instructions provided during this appointment: Patient Instructions   ____________________________________________________________________________________________  Medication Rules  Purpose: To inform patients, and their family members, of our rules and regulations.  Applies to: All patients receiving prescriptions (written or electronic).  Pharmacy of record: Pharmacy where electronic prescriptions will be sent. If written prescriptions are taken to a different pharmacy, please inform the nursing staff. The pharmacy listed in the electronic medical record should be the one where you would like electronic prescriptions to be sent.  Electronic prescriptions:  In compliance with the [place] (STOP) Act of 2017 (Session Lanny Cramp [phone]), effective December 04, 2018, all controlled substances must be electronically prescribed. Calling prescriptions to the pharmacy will cease to exist.  Prescription refills: Only during scheduled appointments. Applies to all prescriptions.  NOTE: The following applies primarily to controlled substances (Opioid* Pain Medications).   Patient's responsibilities: 1. Pain Pills: Bring all pain pills to every appointment (except for procedure appointments). 2. Pill Bottles: Bring pills in original pharmacy bottle. Always bring the newest bottle. Bring bottle, even if empty. 3. Medication refills: You are responsible for knowing and keeping track of what medications you take and those you need refilled. The day before your appointment: write a list of all prescriptions that need to be refilled. The day of the appointment: give the list to the admitting nurse. Prescriptions will be written only during appointments. If you forget a medication: it will not be "Called in", "Faxed", or "electronically sent". You will need to get another appointment to get these prescribed. No early refills. Do not call asking to have your prescription filled early. 4. Prescription Accuracy: You are responsible for carefully inspecting your prescriptions before leaving our office. Have the discharge nurse carefully go over each prescription with you, before taking them home. Make sure that your name is accurately spelled, that your address is correct. Check the name and dose of your medication to make sure it is accurate. Check the number of pills, and the written instructions to make sure they are clear and accurate. Make sure that you are given enough medication to last until your next medication refill appointment. 5. Taking Medication: Take medication as prescribed. When it comes to controlled substances, taking  less pills or less frequently than prescribed is permitted and encouraged. Never take more pills than instructed. Never take medication more frequently than prescribed.  6. Inform other Doctors: Always inform, all of your healthcare providers, of all the medications you take. 7. Pain Medication from other Providers: You are not allowed to accept any  additional pain medication from any other Doctor or Healthcare provider. There are two exceptions to this rule. (see below) In the event that you require additional pain medication, you are responsible for notifying us, as stated below. 8. Medication Agreement: You are responsible for carefully reading and following our Medication Agreement. This must be signed before receiving any prescriptions from our practice. Safely store a copy of your signed Agreement. Violations to the Agreement will result in no further prescriptions. (Additional copies of our Medication Agreement are available upon request.) 9. Laws, Rules, & Regulations: All patients are expected to follow all Federal and [employer], Rules, [employer]. Ignorance of the Laws does not constitute a valid excuse. The use of any illegal substances is prohibited. 10. Adopted CDC guidelines & recommendations: Target dosing levels will be at or below 60 MME/day. Use of benzodiazepines** is not recommended.  Exceptions: There are only two exceptions to the rule of not receiving pain medications from other Healthcare Providers. 1. Exception #1 (Emergencies): In the event of an emergency (i.e.: accident requiring emergency care), you are allowed to receive additional pain medication. However, you are responsible for: As soon as you are able, call our office [phone], at any time of the day or night, and leave a message stating your name, the date and nature of the emergency, and the name and dose of the medication prescribed. In the event that your call is answered by a member of our staff,  make sure to document and save the date, time, and the name of the person that took your information.  2. Exception #2 (Planned Surgery): In the event that you are scheduled by another doctor or dentist to have any type of surgery or procedure, you are allowed (for a period no longer than 30 days), to receive additional pain medication, for the acute post-op pain. However, in this case, you are responsible for picking up a copy of our "Post-op Pain Management for Surgeons" handout, and giving it to your surgeon or dentist. This document is available at our office, and does not require an appointment to obtain it. Simply go to our office during business hours (Monday-Thursday from 8:00 AM to 4:00 PM) (Friday 8:00 AM to 12:00 Noon) or if you have a scheduled appointment with [place], prior to your surgery, and ask for it by name. In addition, you will need to provide [place] with your name, name of your surgeon, type of surgery, and date of procedure or surgery.  *Opioid medications include: morphine, codeine, oxycodone, oxymorphone, hydrocodone, hydromorphone, meperidine, tramadol, tapentadol, buprenorphine, fentanyl, methadone. **Benzodiazepine medications include: diazepam (Valium), alprazolam (Xanax), clonazepam (Klonopine), lorazepam (Ativan), clorazepate (Tranxene), chlordiazepoxide (Librium), estazolam (Prosom), oxazepam (Serax), temazepam (Restoril), triazolam (Halcion) (Last updated: 01/31/2018) ____________________________________________________________________________________________  BMI Assessment: Estimated body mass index is 38.77 kg/m as calculated from the following:   Height as of this encounter: 5' 8"  (1.727 m).   Weight as of this encounter: 255 lb (115.7 kg).  BMI interpretation table: BMI level Category Range association with higher incidence of chronic pain  <18 kg/m2 Underweight   18.5-24.9 kg/m2 Ideal body weight   25-29.9 kg/m2 Overweight Increased incidence by 20%  30-34.9 kg/m2  Obese (Class I) Increased incidence by 68%  35-39.9 kg/m2 Severe obesity (Class II) Increased incidence by 136%  >40 kg/m2 Extreme obesity (Class III) Increased incidence by 254%   Patient's current BMI Ideal Body weight  Body mass index is 38.77 kg/m. Ideal body weight: 63.9 kg (140 lb 14 oz)  Adjusted ideal body weight: 84.6 kg (186 lb 8.4 oz)   BMI Readings from Last 4 Encounters:  01/07/19 38.77 kg/m  12/24/18 37.88 kg/m  10/04/18 38.51 kg/m  06/03/18 40.76 kg/m   Wt Readings from Last 4 Encounters:  01/07/19 255 lb (115.7 kg)  12/24/18 252 lb 12.8 oz (114.7 kg)  10/04/18 257 lb (116.6 kg)  06/03/18 272 lb (123.4 kg)

## 2019-01-07 NOTE — Progress Notes (Signed)
Nursing Pain Medication Assessment:  Safety precautions to be maintained throughout the outpatient stay will include: orient to surroundings, keep bed in low position, maintain call bell within reach at all times, provide assistance with transfer out of bed and ambulation.  Medication Inspection Compliance: Pill count conducted under aseptic conditions, in front of the patient. Neither the pills nor the bottle was removed from the patient's sight at any time. Once count was completed pills were immediately returned to the patient in their original bottle.  Medication: Oxycodone IR Pill/Patch Count: 1 of 15 pills remain Pill/Patch Appearance: Markings consistent with prescribed medication Bottle Appearance: Standard pharmacy container. Clearly labeled. Filled Date: 3 / 77 / 2019 Last Medication intake:  5 days ago

## 2019-01-07 NOTE — Patient Instructions (Signed)
____________________________________________________________________________________________  Medication Rules  Purpose: To inform patients, and their family members, of our rules and regulations.  Applies to: All patients receiving prescriptions (written or electronic).  Pharmacy of record: Pharmacy where electronic prescriptions will be sent. If written prescriptions are taken to a different pharmacy, please inform the nursing staff. The pharmacy listed in the electronic medical record should be the one where you would like electronic prescriptions to be sent.  Electronic prescriptions: In compliance with the  Strengthen Opioid Misuse Prevention (STOP) Act of 2017 (Session Law 2017-74/H243), effective December 04, 2018, all controlled substances must be electronically prescribed. Calling prescriptions to the pharmacy will cease to exist.  Prescription refills: Only during scheduled appointments. Applies to all prescriptions.  NOTE: The following applies primarily to controlled substances (Opioid* Pain Medications).   Patient's responsibilities: 1. Pain Pills: Bring all pain pills to every appointment (except for procedure appointments). 2. Pill Bottles: Bring pills in original pharmacy bottle. Always bring the newest bottle. Bring bottle, even if empty. 3. Medication refills: You are responsible for knowing and keeping track of what medications you take and those you need refilled. The day before your appointment: write a list of all prescriptions that need to be refilled. The day of the appointment: give the list to the admitting nurse. Prescriptions will be written only during appointments. If you forget a medication: it will not be "Called in", "Faxed", or "electronically sent". You will need to get another appointment to get these prescribed. No early refills. Do not call asking to have your prescription filled early. 4. Prescription Accuracy: You are responsible for  carefully inspecting your prescriptions before leaving our office. Have the discharge nurse carefully go over each prescription with you, before taking them home. Make sure that your name is accurately spelled, that your address is correct. Check the name and dose of your medication to make sure it is accurate. Check the number of pills, and the written instructions to make sure they are clear and accurate. Make sure that you are given enough medication to last until your next medication refill appointment. 5. Taking Medication: Take medication as prescribed. When it comes to controlled substances, taking less pills or less frequently than prescribed is permitted and encouraged. Never take more pills than instructed. Never take medication more frequently than prescribed.  6. Inform other Doctors: Always inform, all of your healthcare providers, of all the medications you take. 7. Pain Medication from other Providers: You are not allowed to accept any additional pain medication from any other Doctor or Healthcare provider. There are two exceptions to this rule. (see below) In the event that you require additional pain medication, you are responsible for notifying us, as stated below. 8. Medication Agreement: You are responsible for carefully reading and following our Medication Agreement. This must be signed before receiving any prescriptions from our practice. Safely store a copy of your signed Agreement. Violations to the Agreement will result in no further prescriptions. (Additional copies of our Medication Agreement are available upon request.) 9. Laws, Rules, & Regulations: All patients are expected to follow all Federal and State Laws, Statutes, Rules, & Regulations. Ignorance of the Laws does not constitute a valid excuse. The use of any illegal substances is prohibited. 10. Adopted CDC guidelines & recommendations: Target dosing levels will be at or below 60 MME/day. Use of benzodiazepines** is not  recommended.  Exceptions: There are only two exceptions to the rule of not receiving pain medications from other Healthcare Providers. 1.   Exception #1 (Emergencies): In the event of an emergency (i.e.: accident requiring emergency care), you are allowed to receive additional pain medication. However, you are responsible for: As soon as you are able, call our office [phone], at any time of the day or night, and leave a message stating your name, the date and nature of the emergency, and the name and dose of the medication prescribed. In the event that your call is answered by a member of our staff, make sure to document and save the date, time, and the name of the person that took your information.  2. Exception #2 (Planned Surgery): In the event that you are scheduled by another doctor or dentist to have any type of surgery or procedure, you are allowed (for a period no longer than 30 days), to receive additional pain medication, for the acute post-op pain. However, in this case, you are responsible for picking up a copy of our "Post-op Pain Management for Surgeons" handout, and giving it to your surgeon or dentist. This document is available at our office, and does not require an appointment to obtain it. Simply go to our office during business hours (Monday-Thursday from 8:00 AM to 4:00 PM) (Friday 8:00 AM to 12:00 Noon) or if you have a scheduled appointment with [place], prior to your surgery, and ask for it by name. In addition, you will need to provide [place] with your name, name of your surgeon, type of surgery, and date of procedure or surgery.  *Opioid medications include: morphine, codeine, oxycodone, oxymorphone, hydrocodone, hydromorphone, meperidine, tramadol, tapentadol, buprenorphine, fentanyl, methadone. **Benzodiazepine medications include: diazepam (Valium), alprazolam (Xanax), clonazepam (Klonopine), lorazepam (Ativan), clorazepate (Tranxene), chlordiazepoxide (Librium), estazolam (Prosom),  oxazepam (Serax), temazepam (Restoril), triazolam (Halcion) (Last updated: 01/31/2018) ____________________________________________________________________________________________  BMI Assessment: Estimated body mass index is 38.77 kg/m as calculated from the following:   Height as of this encounter: 5\' 8"  (1.727 m).   Weight as of this encounter: 255 lb (115.7 kg).  BMI interpretation table: BMI level Category Range association with higher incidence of chronic pain  <18 kg/m2 Underweight   18.5-24.9 kg/m2 Ideal body weight   25-29.9 kg/m2 Overweight Increased incidence by 20%  30-34.9 kg/m2 Obese (Class I) Increased incidence by 68%  35-39.9 kg/m2 Severe obesity (Class II) Increased incidence by 136%  >40 kg/m2 Extreme obesity (Class III) Increased incidence by 254%   Patient's current BMI Ideal Body weight  Body mass index is 38.77 kg/m. Ideal body weight: 63.9 kg (140 lb 14 oz) Adjusted ideal body weight: 84.6 kg (186 lb 8.4 oz)   BMI Readings from Last 4 Encounters:  01/07/19 38.77 kg/m  12/24/18 37.88 kg/m  10/04/18 38.51 kg/m  06/03/18 40.76 kg/m   Wt Readings from Last 4 Encounters:  01/07/19 255 lb (115.7 kg)  12/24/18 252 lb 12.8 oz (114.7 kg)  10/04/18 257 lb (116.6 kg)  06/03/18 272 lb (123.4 kg)

## 2019-01-08 ENCOUNTER — Ambulatory Visit
Admission: RE | Admit: 2019-01-08 | Discharge: 2019-01-08 | Disposition: A | Discharge status: Home / Self Care | Payer: Medicare Other | Source: Ambulatory Visit | Attending: Oncology | Admitting: Oncology

## 2019-01-08 DIAGNOSIS — F17210 Nicotine dependence, cigarettes, uncomplicated: Secondary | ICD-10-CM | POA: Diagnosis not present

## 2019-01-08 DIAGNOSIS — Z122 Encounter for screening for malignant neoplasm of respiratory organs: Secondary | ICD-10-CM

## 2019-01-09 ENCOUNTER — Telehealth: Payer: Self-pay | Admitting: *Deleted

## 2019-01-09 NOTE — Telephone Encounter (Signed)
Notified patient of LDCT lung cancer screening program results with recommendation for 6 month follow up imaging. Also notified of incidental findings noted below and is encouraged to discuss further with PCP who will receive a copy of this note and/or the CT report. Patient verbalizes understanding.   IMPRESSION: 1. Lung-RADS 3, probably benign findings. Short-term follow-up in 6 months is recommended with repeat low-dose chest CT without contrast (please use the following order, "CT CHEST LCS NODULE FOLLOW-UP W/O CM"). 2. Three-vessel coronary atherosclerosis.  Aortic Atherosclerosis (ICD10-I70.0) and Emphysema (ICD10-J43.9).

## 2019-01-10 ENCOUNTER — Encounter: Payer: Self-pay | Admitting: *Deleted

## 2019-01-10 DIAGNOSIS — I714 Abdominal aortic aneurysm, without rupture: Secondary | ICD-10-CM | POA: Diagnosis not present

## 2019-01-10 DIAGNOSIS — Z720 Tobacco use: Secondary | ICD-10-CM | POA: Insufficient documentation

## 2019-01-10 DIAGNOSIS — E782 Mixed hyperlipidemia: Secondary | ICD-10-CM | POA: Diagnosis not present

## 2019-01-10 DIAGNOSIS — I2510 Atherosclerotic heart disease of native coronary artery without angina pectoris: Secondary | ICD-10-CM | POA: Diagnosis not present

## 2019-01-10 DIAGNOSIS — I10 Essential (primary) hypertension: Secondary | ICD-10-CM | POA: Diagnosis not present

## 2019-01-31 ENCOUNTER — Other Ambulatory Visit: Payer: Self-pay | Admitting: Family Medicine

## 2019-02-03 ENCOUNTER — Ambulatory Visit: Payer: Self-pay | Admitting: Family Medicine

## 2019-02-04 ENCOUNTER — Ambulatory Visit (INDEPENDENT_AMBULATORY_CARE_PROVIDER_SITE_OTHER): Payer: Medicare Other

## 2019-02-04 ENCOUNTER — Encounter: Payer: Self-pay | Admitting: Family Medicine

## 2019-02-04 VITALS — BP 130/68 | HR 64 | Temp 98.4°F | Ht 68.0 in | Wt 259.6 lb

## 2019-02-04 DIAGNOSIS — Z0000 Encounter for general adult medical examination without abnormal findings: Secondary | ICD-10-CM | POA: Diagnosis not present

## 2019-02-04 NOTE — Progress Notes (Signed)
Subjective:   Laura Nielsen is a 62 y.o. female who presents for an Initial Medicare Annual Wellness Visit.   Review of Systems    N/A  Cardiac Risk Factors include: advanced age (>41men, >50 women);diabetes mellitus;dyslipidemia;hypertension;obesity (BMI >30kg/m2)     Objective:    Today's Vitals   02/04/19 0834 02/04/19 0842  BP: 130/68   Pulse: 64   Temp: 98.4 F (36.9 C)   TempSrc: Oral   Weight: 259 lb 9.6 oz (117.8 kg)   Height: 5\' 8"  (1.727 m)   PainSc: 4  4   PainLoc: Back    Body mass index is 39.47 kg/m.  Advanced Directives 02/04/2019 07/26/2017 07/04/2017 06/11/2017 04/26/2017 03/08/2017 02/22/2017  Does Patient Have a Medical Advance Directive? No No No No No Yes;No Yes  Type of Advance Directive - - - - - - [place]  Would patient like information on creating a medical advance directive? No - Patient declined No - Patient declined - No - Patient declined - No - Patient declined -    Current Medications (verified) Outpatient Encounter Medications as of 02/04/2019  Medication Sig  . albuterol (PROVENTIL HFA) 108 (90 Base) MCG/ACT inhaler Inhale 2 puffs into the lungs every 6 (six) hours as needed for wheezing or shortness of breath.  . allopurinol (ZYLOPRIM) 100 MG tablet Take 2 tablets (200 mg total) by mouth daily. (Patient taking differently: Take 100 mg by mouth daily. )  . aspirin 81 MG tablet Take 81 mg by mouth daily.   . bisoprolol (ZEBETA) 10 MG tablet Take 1 tablet (10 mg total) by mouth daily.  . Cholecalciferol (VITAMIN D3) 2000 units capsule Take 1 capsule (2,000 Units total) by mouth daily.  . clopidogrel (PLAVIX) 75 MG tablet Take 1 tablet (75 mg total) by mouth daily.  . Cyanocobalamin (RA VITAMIN B-12 TR) 1000 MCG TBCR Take 2 tablets by mouth daily.   Marland Kitchen gabapentin (NEURONTIN) 300 MG capsule Take 1 capsule (300 mg total) by mouth 3 (three) times daily.  . Ibuprofen 200 MG CAPS Take by mouth.   Marland Kitchen lisinopril-hydrochlorothiazide  (PRINZIDE,ZESTORETIC) 20-12.5 MG tablet Take 1 tablet by mouth daily.  Marland Kitchen loratadine (CLARITIN) 10 MG tablet Take 10 mg by mouth daily as needed.   Derrill Memo ON 02/06/2019] oxyCODONE (ROXICODONE) 5 MG immediate release tablet Take 1 tablet (5 mg total) by mouth every 6 (six) hours as needed for up to 30 days for severe pain.  . rosuvastatin (CRESTOR) 20 MG tablet Take 1 tablet (20 mg total) by mouth daily.  . Semaglutide,0.25 or 0.5MG /DOS, (OZEMPIC, 0.25 OR 0.5 MG/DOSE,) 2 MG/1.5ML SOPN Inject 0.5 mg into the skin once a week.  . vitamin C (ASCORBIC ACID) 500 MG tablet Take 500 mg by mouth.   . budesonide-formoterol (SYMBICORT) 160-4.5 MCG/ACT inhaler Inhale 2 puffs into the lungs 2 (two) times daily. (Patient not taking: Reported on 02/04/2019)  . buPROPion (WELLBUTRIN XL) 300 MG 24 hr tablet Take 1 tablet (300 mg total) by mouth daily. (Patient not taking: Reported on 02/04/2019)  . oxyCODONE (ROXICODONE) 5 MG immediate release tablet Take 1 tablet (5 mg total) by mouth every 6 (six) hours as needed for up to 30 days for severe pain. (Patient not taking: Reported on 02/04/2019)   No facility-administered encounter medications on file as of 02/04/2019.     Allergies (verified) Atorvastatin and Omeprazole   History: Past Medical History:  Diagnosis Date  . Allergy   . Arthritis   . Asthma   .  Depression   . Diabetes mellitus without complication ([place])   . Emphysema of lung ([place])   . GERD (gastroesophageal reflux disease)   . Hyperlipidemia   . Hypertension   . Osteoporosis   . Tobacco abuse counseling    Past Surgical History:  Procedure Laterality Date  . Cardiac catheterization  3/210   70-80% stenosis RCA stent placed. started on Plavix  . [place]  . TUBAL LIGATION    . VAGINAL HYSTERECTOMY     Menometrorrhagia. Excessive bleeding. Unknown if cervix removed.   . vascular stent  03/28/2011   Dr. Delana Meyer, [facility]; Infrarenal   Family History  Problem Relation Age of  Onset  . Hypertension Mother   . Coronary artery disease Mother   . Heart attack Mother        acute  . Alcohol abuse Father   . Depression Father   . Hypertension Father   . Heart attack Father 21       acute  . Alcohol abuse Sister   . Hyperlipidemia Sister   . Hypertension Sister   . Cancer Sister 75  . Heart attack Sister        x's 2  . Coronary artery disease Sister 66       x's 2  . Breast cancer Sister 15   Social History   Socioeconomic History  . Marital status: Divorced    Spouse name: Not on file  . Number of children: 2  . Years of education: H/S  . Highest education level: High school graduate  Occupational History  . Occupation: Disabled  Social Needs  . Financial resource strain: Somewhat hard  . Food insecurity:    Worry: Never true    Inability: Never true  . Transportation needs:    Medical: No    Non-medical: No  Tobacco Use  . Smoking status: Former Smoker    Packs/day: 1.50    Years: 44.50    Pack years: 66.75    Types: Cigarettes    Last attempt to quit: 01/21/2019    Years since quitting: 0.0  . Smokeless tobacco: Never Used  Substance and Sexual Activity  . Alcohol use: Yes    Comment: 1 drink every 3-4 months  . Drug use: No  . Sexual activity: Not on file  Lifestyle  . Physical activity:    Days per week: 0 days    Minutes per session: 0 min  . Stress: Only a little  Relationships  . Social connections:    Talks on phone: Patient refused    Gets together: Patient refused    Attends religious service: Patient refused    Active member of club or organization: Patient refused    Attends meetings of clubs or organizations: Patient refused    Relationship status: Patient refused  Other Topics Concern  . Not on file  Social History Narrative  . Not on file    Tobacco Counseling Counseling given: Not Answered   Clinical Intake:  Pre-visit preparation completed: Yes  Pain : 0-10 Pain Score: 4  Pain Type: Chronic  pain Pain Location: Back Pain Orientation: (entire back) Pain Descriptors / Indicators: Stabbing Pain Frequency: Constant    Diabetes:  Is the patient diabetic?  Yes type 2 If diabetic, was a CBG obtained today?  No  Did the patient bring in their glucometer from home?  No  How often do you monitor your CBG's? Does not.   Financial Strains and Diabetes Management:  Are you having any financial strains with the device, your supplies or your medication? No .  Does the patient want to be seen by Chronic Care Management for management of their diabetes?  No  Would the patient like to be referred to a Nutritionist or for Diabetic Management?  No   Diabetic Exams:  Diabetic Eye Exam: Completed 01/09/18. Overdue for diabetic eye exam. Pt has been advised about the importance in completing this exam. Pt declined scheduling an eye exam this year.   Diabetic Foot Exam: Completed 09/28/5. Pt has been advised about the importance in completing this exam. Note made to f/u on this at CPE on 02/12/19.  Nutritional Status: BMI > 30  Obese   How often do you need to have someone help you when you read instructions, pamphlets, or other written materials from your doctor or pharmacy?: 1 - Never  Interpreter Needed?: No  Information entered by :: [facility], LPN   Activities of Daily Living In your present state of health, do you have any difficulty performing the following activities: 02/04/2019  Hearing? Y  Comment Feels like she hears crickets in the background all the time. Does not wear hearing aids.   Vision? N  Difficulty concentrating or making decisions? Y  Walking or climbing stairs? Y  Comment Due to back pain.   Dressing or bathing? N  Doing errands, shopping? N  Preparing Food and eating ? N  Using the Toilet? N  In the past six months, have you accidently leaked urine? N  Do you have problems with loss of bowel control? N  Managing your Medications? N  Managing your Finances?  N  Housekeeping or managing your Housekeeping? N  Some recent data might be hidden     Immunizations and Health Maintenance Immunization History  Administered Date(s) Administered  . Pneumococcal Polysaccharide-23 09/25/2016  . Tdap 08/30/2017   Health Maintenance Due  Topic Date Due  . FOOT EXAM  12/14/1966  . OPHTHALMOLOGY EXAM  01/09/2019    Patient Care Team: Birdie Sons, MD as PCP - General (Family Medicine) Ubaldo Glassing Javier Docker, MD as Consulting Physician (Cardiology) Schnier, Dolores Lory, MD (Vascular Surgery) Vevelyn Francois, NP as Nurse Practitioner (Nurse Practitioner)  Indicate any recent Medical Services you may have received from other than [facility] providers in the past year (date may be approximate).     Assessment:   This is a routine wellness examination for Sanyah.  Hearing/Vision screen No exam data present  Dietary issues and exercise activities discussed: Current Exercise Habits: The patient does not participate in regular exercise at present, Exercise limited by: orthopedic condition(s)  Goals    . Have 3 meals a day     Recommend to decrease portion sizes by eating 3 small healthy meals and at least 2 healthy snacks per day.      Depression Screen PHQ 2/9 Scores 02/04/2019 02/05/2018 01/30/2018 12/12/2017 07/04/2017 04/26/2017 03/08/2017  PHQ - 2 Score 2 0 1 3 0 0 0  PHQ- 9 Score 6 - 8 10 - - -    Fall Risk Fall Risk  02/04/2019 01/07/2019 02/05/2018 01/30/2018 07/04/2017  Falls in the past year? 0 1 No No No  Number falls in past yr: - 0 - - -  Injury with Fall? - 0 - - -  Risk for fall due to : - - - - -   FALL RISK PREVENTION PERTAINING TO THE HOME: Any stairs in or around the home? No  If so, do they handrails? N/A  Home free of loose throw rugs in walkways, pet beds, electrical cords, etc? Yes  Adequate lighting in your home to reduce risk of falls? Yes   ASSISTIVE DEVICES UTILIZED TO PREVENT FALLS:  Life alert? No  Use of a cane, walker or w/c? No   Grab bars in the bathroom? No  Shower chair or bench in shower? No  Elevated toilet seat or a handicapped toilet? No    TIMED UP AND GO:  Was the test performed? No .    Cognitive Function: Declined today.         Screening Tests Health Maintenance  Topic Date Due  . FOOT EXAM  12/14/1966  . OPHTHALMOLOGY EXAM  01/09/2019  . INFLUENZA VACCINE  03/04/2019 (Originally 07/04/2018)  . HEMOGLOBIN A1C  04/04/2019  . Fecal DNA (Cologuard)  09/17/2020  . MAMMOGRAM  01/07/2021  . PAP SMEAR-Modifier  01/30/2021  . TETANUS/TDAP  08/31/2027  . PNEUMOCOCCAL POLYSACCHARIDE VACCINE AGE 5-64 HIGH RISK  Completed  . Hepatitis C Screening  Completed  . HIV Screening  Completed    Qualifies for Shingles Vaccine? Yes . Due for Shingrix. Education has been provided regarding the importance of this vaccine. Pt has been advised to call insurance company to determine out of pocket expense. Advised may also receive vaccine at local pharmacy or Health Dept. Verbalized acceptance and understanding.  Tdap: Up to date  Flu Vaccine: Due for Flu vaccine. Does the patient want to receive this vaccine today?  No . Education has been provided regarding the importance of this vaccine but still declined. Advised may receive this vaccine at local pharmacy or Health Dept. Aware to provide a copy of the vaccination record if obtained from local pharmacy or Health Dept. Verbalized acceptance and understanding.   Cancer Screenings:  Colorectal Screening: Cologuard completed 09/17/17. Repeat every 3 years.  Mammogram: Completed 01/07/19.   Lung Cancer Screening: (Low Dose CT Chest recommended if Age 52-80 years, 30 pack-year currently smoking OR have quit w/in 15years.) does qualify however is not due for this until 01/09/20.    Additional Screening:  Hepatitis C Screening: Up to date  Vision Screening: Recommended annual ophthalmology exams for early detection of glaucoma and other disorders of the  eye.  Dental Screening: Recommended annual dental exams for proper oral hygiene  Community Resource Referral:  CRR required this visit?  No       Plan:  I have personally reviewed and addressed the Medicare Annual Wellness questionnaire and have noted the following in the patient's chart:  A. Medical and social history B. Use of alcohol, tobacco or illicit drugs  C. Current medications and supplements D. Functional ability and status E.  Nutritional status F.  Physical activity G. Advance directives H. List of other physicians I.  Hospitalizations, surgeries, and ER visits in previous 12 months J.  [place] such as hearing and vision if needed, cognitive and depression L. Referrals and appointments - none  In addition, I have reviewed and discussed with patient certain preventive protocols, quality metrics, and best practice recommendations. A written personalized care plan for preventive services as well as general preventive health recommendations were provided to patient.  See attached scanned questionnaire for additional information.   Signed,  Fabio Neighbors, LPN Nurse Health Advisor   Nurse Recommendations: Pt needs a diabetic foot exam at next OV. Pt declines scheduling an eye exam this year or receiving a influenza vaccine.

## 2019-02-04 NOTE — Patient Instructions (Addendum)
Laura Nielsen , Thank you for taking time to come for your Medicare Wellness Visit. I appreciate your ongoing commitment to your health goals. Please review the following plan we discussed and let me know if I can assist you in the future.   Screening recommendations/referrals: Colonoscopy: Cologuard due 09/17/20  Mammogram: Up to date, due 01/2021 Bone Density: Not required until age 62. Recommended yearly ophthalmology/optometry visit for glaucoma screening and checkup Recommended yearly dental visit for hygiene and checkup  Vaccinations: Influenza vaccine: Pt declines today.  Pneumococcal vaccine: Not required until age 64.  Tdap vaccine: Up to date, due 08/2027 Shingles vaccine: Pt declines today.     Advanced directives: Advance directive discussed with you today. Even though you declined this today please call our office should you change your mind and we can give you the proper paperwork for you to fill out.  Conditions/risks identified: Obesity/Diabetes- recommend to decrease portion sizes by eating 3 small healthy meals and at least 2 healthy snacks per day.  Next appointment: 02/12/19 @ 10:00 AM with Dr Caryn Section.   Preventive Care 40-64 Years, Female Preventive care refers to lifestyle choices and visits with your health care provider that can promote health and wellness. What does preventive care include?  A yearly physical exam. This is also called an annual well check.  Dental exams once or twice a year.  Routine eye exams. Ask your health care provider how often you should have your eyes checked.  Personal lifestyle choices, including:  Daily care of your teeth and gums.  Regular physical activity.  Eating a healthy diet.  Avoiding tobacco and drug use.  Limiting alcohol use.  Practicing safe sex.  Taking low-dose aspirin daily starting at age 105.  Taking vitamin and mineral supplements as recommended by your health care provider. What happens during an annual  well check? The services and screenings done by your health care provider during your annual well check will depend on your age, overall health, lifestyle risk factors, and family history of disease. Counseling  Your health care provider may ask you questions about your:  Alcohol use.  Tobacco use.  Drug use.  Emotional well-being.  Home and relationship well-being.  Sexual activity.  Eating habits.  Work and work Statistician.  Method of birth control.  Menstrual cycle.  Pregnancy history. Screening  You may have the following tests or measurements:  Height, weight, and BMI.  Blood pressure.  Lipid and cholesterol levels. These may be checked every 5 years, or more frequently if you are over 49 years old.  Skin check.  Lung cancer screening. You may have this screening every year starting at age 73 if you have a 30-pack-year history of smoking and currently smoke or have quit within the past 15 years.  Fecal occult blood test (FOBT) of the stool. You may have this test every year starting at age 21.  Flexible sigmoidoscopy or colonoscopy. You may have a sigmoidoscopy every 5 years or a colonoscopy every 10 years starting at age 46.  Hepatitis C blood test.  Hepatitis B blood test.  Sexually transmitted disease (STD) testing.  Diabetes screening. This is done by checking your blood sugar (glucose) after you have not eaten for a while (fasting). You may have this done every 1-3 years.  Mammogram. This may be done every 1-2 years. Talk to your health care provider about when you should start having regular mammograms. This may depend on whether you have a family history of breast cancer.  BRCA-related cancer screening. This may be done if you have a family history of breast, ovarian, tubal, or peritoneal cancers.  Pelvic exam and Pap test. This may be done every 3 years starting at age 57. Starting at age 62, this may be done every 5 years if you have a Pap test in  combination with an HPV test.  Bone density scan. This is done to screen for osteoporosis. You may have this scan if you are at high risk for osteoporosis. Discuss your test results, treatment options, and if necessary, the need for more tests with your health care provider. Vaccines  Your health care provider may recommend certain vaccines, such as:  Influenza vaccine. This is recommended every year.  Tetanus, diphtheria, and acellular pertussis (Tdap, Td) vaccine. You may need a Td booster every 10 years.  Zoster vaccine. You may need this after age 79.  Pneumococcal 13-valent conjugate (PCV13) vaccine. You may need this if you have certain conditions and were not previously vaccinated.  Pneumococcal polysaccharide (PPSV23) vaccine. You may need one or two doses if you smoke cigarettes or if you have certain conditions. Talk to your health care provider about which screenings and vaccines you need and how often you need them. This information is not intended to replace advice given to you by your health care provider. Make sure you discuss any questions you have with your health care provider. Document Released: 12/17/2015 Document Revised: 08/09/2016 Document Reviewed: 09/21/2015 Elsevier Interactive Patient Education  2017 [place] Prevention in the Home Falls can cause injuries. They can happen to people of all ages. There are many things you can do to make your home safe and to help prevent falls. What can I do on the outside of my home?  Regularly fix the edges of walkways and driveways and fix any cracks.  Remove anything that might make you trip as you walk through a door, such as a raised step or threshold.  Trim any bushes or trees on the path to your home.  Use bright outdoor lighting.  Clear any walking paths of anything that might make someone trip, such as rocks or tools.  Regularly check to see if handrails are loose or broken. Make sure that both  sides of any steps have handrails.  Any raised decks and porches should have guardrails on the edges.  Have any leaves, snow, or ice cleared regularly.  Use sand or salt on walking paths during winter.  Clean up any spills in your garage right away. This includes oil or grease spills. What can I do in the bathroom?  Use night lights.  Install grab bars by the toilet and in the tub and shower. Do not use towel bars as grab bars.  Use non-skid mats or decals in the tub or shower.  If you need to sit down in the shower, use a plastic, non-slip stool.  Keep the floor dry. Clean up any water that spills on the floor as soon as it happens.  Remove soap buildup in the tub or shower regularly.  Attach bath mats securely with double-sided non-slip rug tape.  Do not have throw rugs and other things on the floor that can make you trip. What can I do in the bedroom?  Use night lights.  Make sure that you have a light by your bed that is easy to reach.  Do not use any sheets or blankets that are too big for your bed. They  should not hang down onto the floor.  Have a firm chair that has side arms. You can use this for support while you get dressed.  Do not have throw rugs and other things on the floor that can make you trip. What can I do in the kitchen?  Clean up any spills right away.  Avoid walking on wet floors.  Keep items that you use a lot in easy-to-reach places.  If you need to reach something above you, use a strong step stool that has a grab bar.  Keep electrical cords out of the way.  Do not use floor polish or wax that makes floors slippery. If you must use wax, use non-skid floor wax.  Do not have throw rugs and other things on the floor that can make you trip. What can I do with my stairs?  Do not leave any items on the stairs.  Make sure that there are handrails on both sides of the stairs and use them. Fix handrails that are broken or loose. Make sure that  handrails are as long as the stairways.  Check any carpeting to make sure that it is firmly attached to the stairs. Fix any carpet that is loose or worn.  Avoid having throw rugs at the top or bottom of the stairs. If you do have throw rugs, attach them to the floor with carpet tape.  Make sure that you have a light switch at the top of the stairs and the bottom of the stairs. If you do not have them, ask someone to add them for you. What else can I do to help prevent falls?  Wear shoes that:  Do not have high heels.  Have rubber bottoms.  Are comfortable and fit you well.  Are closed at the toe. Do not wear sandals.  If you use a stepladder:  Make sure that it is fully opened. Do not climb a closed stepladder.  Make sure that both sides of the stepladder are locked into place.  Ask someone to hold it for you, if possible.  Clearly mark and make sure that you can see:  Any grab bars or handrails.  First and last steps.  Where the edge of each step is.  Use tools that help you move around (mobility aids) if they are needed. These include:  Canes.  Walkers.  Scooters.  Crutches.  Turn on the lights when you go into a dark area. Replace any light bulbs as soon as they burn out.  Set up your furniture so you have a clear path. Avoid moving your furniture around.  If any of your floors are uneven, fix them.  If there are any pets around you, be aware of where they are.  Review your medicines with your doctor. Some medicines can make you feel dizzy. This can increase your chance of falling. Ask your doctor what other things that you can do to help prevent falls. This information is not intended to replace advice given to you by your health care provider. Make sure you discuss any questions you have with your health care provider. Document Released: 09/16/2009 Document Revised: 04/27/2016 Document Reviewed: 12/25/2014 Elsevier Interactive Patient Education  2017  [employer].

## 2019-02-07 ENCOUNTER — Ambulatory Visit (INDEPENDENT_AMBULATORY_CARE_PROVIDER_SITE_OTHER): Payer: Medicare Other | Admitting: Family Medicine

## 2019-02-07 ENCOUNTER — Encounter: Payer: Self-pay | Admitting: Family Medicine

## 2019-02-07 VITALS — BP 128/70 | HR 84 | Temp 97.9°F | Resp 16 | Wt 257.0 lb

## 2019-02-07 DIAGNOSIS — K047 Periapical abscess without sinus: Secondary | ICD-10-CM

## 2019-02-07 MED ORDER — AMOXICILLIN-POT CLAVULANATE 875-125 MG PO TABS: 1.0000 | ORAL_TABLET | Freq: Two times a day (BID) | ORAL | 0 refills | Status: DC

## 2019-02-07 NOTE — Patient Instructions (Signed)
Dental Abscess    A dental abscess is a collection of pus in or around a tooth that results from an infection. An abscess can cause pain in the affected area as well as other symptoms. Treatment is important to help with symptoms and to prevent the infection from spreading.  What are the causes?  This condition is caused by a bacterial infection around the root of the tooth that involves the inner part of the tooth (pulp). It may result from:   Severe tooth decay.   Trauma to the tooth, such as a broken or chipped tooth, that allows bacteria to enter into the pulp.   Severe gum disease around a tooth.  What increases the risk?  This condition is more likely to develop in males. It is also more likely to develop in people who:   Have dental decay (cavities).   Eat sugary snacks between meals.   Use tobacco products.   Have diabetes.   Have a weakened disease-fighting system (immune system).   Do not brush and care for their teeth regularly.  What are the signs or symptoms?  Symptoms of this condition include:   Severe pain in and around the infected tooth.   Swelling and redness around the infected tooth, in the mouth, or in the face.   Tenderness.   Pus drainage.   Bad breath.   Bitter taste in the mouth.   Difficulty swallowing.   Difficulty opening the mouth.   Nausea.   Vomiting.   Chills.   Swollen neck glands.   Fever.  How is this diagnosed?  This condition is diagnosed based on:   Your symptoms and your medical and dental history.   An examination of the infected tooth. During the exam, your dentist may tap on the infected tooth.  You may also have X-rays of the affected area.  How is this treated?  This condition is treated by getting rid of the infection. This may be done with:   Incision and drainage. This procedure is done by making an incision in the abscess to drain out the pus. Removing pus is the first priority in treating an abscess.   Antibiotic medicines. These may be used  in certain situations.   Antibacterial mouth rinse.   A root canal. This may be performed to save the tooth. Your dentist accesses the visible part of your tooth (crown) with a drill and removes any damaged pulp. Then the space is filled and sealed off.   Tooth extraction. The tooth is pulled out if it cannot be saved by other treatment.  You may also receive treatment for pain, such as:   Acetaminophen or NSAIDs.   Gels that contain a numbing medicine.   An injection to block the pain near your nerve.  Follow these instructions at home:  Medicines   Take over-the-counter and prescription medicines only as told by your dentist.   If you were prescribed an antibiotic, take it as told by your dentist. Do not stop taking the antibiotic even if you start to feel better.   If you were prescribed a gel that contains a numbing medicine, use it exactly as told in the directions. Do not use these gels for children who are younger than 2 years of age.   Do not drive or use heavy machinery while taking prescription pain medicine.  General instructions   Rinse out your mouth often with salt water to relieve pain or swelling. To make a salt-water   mixture, completely dissolve -1 tsp of salt in 1 cup of warm water.   Eat a soft diet while your abscess is healing.   Drink enough fluid to keep your urine pale yellow.   Do not apply heat to the outside of your mouth.   Do not use any products that contain nicotine or tobacco, such as cigarettes and e-cigarettes. If you need help quitting, ask your health care provider.   Keep all follow-up visits as told by your dentist. This is important.  How is this prevented?   Brush your teeth every morning and night with fluoride toothpaste. Floss one time each day.   Get regularly scheduled dental cleanings.   Consider having a dental sealant applied on teeth that have deep holes (caries).   Drink fluoridated water regularly. This includes most tap water. Check the label  on bottled water to see if it contains fluoride.   Drink water instead of sugary drinks.   Eat healthy meals and snacks.   Wear a mouth guard or face shield to protect your teeth while playing sports.  Contact a health care provider if:   Your pain is worse and is not helped by medicine.  Get help right away if:   You have a fever or chills.   Your symptoms suddenly get worse.   You have a very bad headache.   You have problems breathing or swallowing.   You have trouble opening your mouth.   You have swelling in your neck or around your eye.  Summary   A dental abscess is a collection of pus in or around a tooth that results from an infection.   A dental abscess may result from severe tooth decay, trauma to the tooth, or severe gum disease around a tooth.   Symptoms include severe pain, swelling, redness, and drainage of pus in and around the infected tooth.   The first priority in treating a dental abscess is to drain out the pus. Treatment may also involve removing damage inside the tooth (root canal) or pulling out (extracting) the tooth.  This information is not intended to replace advice given to you by your health care provider. Make sure you discuss any questions you have with your health care provider.  Document Released: 11/20/2005 Document Revised: 07/23/2017 Document Reviewed: 07/23/2017  Elsevier Interactive Patient Education  2019 [employer]

## 2019-02-07 NOTE — Progress Notes (Signed)
Patient: Laura Nielsen Female    DOB: [birth year]   63 y.o.   MRN: [id number] Visit Date: 02/07/2019  Today's Provider: Vernie Murders, PA   Chief Complaint  Patient presents with  . Dental Pain    Started 02/05/2019   Subjective:     Dental Pain   This is a new problem. The current episode started in the past 7 days (Started 02/05/2019.  Pt called the dentist and was not able to get an appointment until next week. ). The problem occurs constantly. The problem has been gradually worsening. The pain is severe. Associated symptoms include facial pain and sinus pressure. Pertinent negatives include no difficulty swallowing, fever, oral bleeding or thermal sensitivity. She has tried NSAIDs for the symptoms. The treatment provided mild relief.   Past Medical History:  Diagnosis Date  . Allergy   . Arthritis   . Asthma   . Depression   . Diabetes mellitus without complication ([place])   . Emphysema of lung ([place])   . GERD (gastroesophageal reflux disease)   . Hyperlipidemia   . Hypertension   . Osteoporosis   . Tobacco abuse counseling    Past Surgical History:  Procedure Laterality Date  . Cardiac catheterization  3/210   70-80% stenosis RCA stent placed. started on Plavix  . [place]  . TUBAL LIGATION    . VAGINAL HYSTERECTOMY     Menometrorrhagia. Excessive bleeding. Unknown if cervix removed.   . vascular stent  03/28/2011   Dr. Delana Meyer, [facility]; Infrarenal   Family History  Problem Relation Age of Onset  . Hypertension Mother   . Coronary artery disease Mother   . Heart attack Mother        acute  . Alcohol abuse Father   . Depression Father   . Hypertension Father   . Heart attack Father 16       acute  . Alcohol abuse Sister   . Hyperlipidemia Sister   . Hypertension Sister   . Cancer Sister 22  . Heart attack Sister        x's 2  . Coronary artery disease Sister 80       x's 2  . Breast cancer Sister 4   Allergies  Allergen Reactions    . Atorvastatin Other (See Comments)    Elevated blood sugar Elevated blood sugar  . Omeprazole Nausea And Vomiting    Current Outpatient Medications:  .  albuterol (PROVENTIL HFA) 108 (90 Base) MCG/ACT inhaler, Inhale 2 puffs into the lungs every 6 (six) hours as needed for wheezing or shortness of breath., Disp: 18 g, Rfl: 11 .  allopurinol (ZYLOPRIM) 100 MG tablet, Take 2 tablets (200 mg total) by mouth daily. (Patient taking differently: Take 100 mg by mouth daily. ), Disp: 60 tablet, Rfl: 11 .  aspirin 81 MG tablet, Take 81 mg by mouth daily. , Disp: , Rfl:  .  bisoprolol (ZEBETA) 10 MG tablet, Take 1 tablet (10 mg total) by mouth daily., Disp: 30 tablet, Rfl: 3 .  Cholecalciferol (VITAMIN D3) 2000 units capsule, Take 1 capsule (2,000 Units total) by mouth daily., Disp: 30 capsule, Rfl: PRN .  clopidogrel (PLAVIX) 75 MG tablet, Take 1 tablet (75 mg total) by mouth daily., Disp: 90 tablet, Rfl: 3 .  Cyanocobalamin (RA VITAMIN B-12 TR) 1000 MCG TBCR, Take 2 tablets by mouth daily. , Disp: , Rfl:  .  gabapentin (NEURONTIN) 300 MG capsule, Take 1 capsule (  300 mg total) by mouth 3 (three) times daily., Disp: 90 capsule, Rfl: 5 .  Ibuprofen 200 MG CAPS, Take by mouth. , Disp: , Rfl:  .  lisinopril-hydrochlorothiazide (PRINZIDE,ZESTORETIC) 20-12.5 MG tablet, Take 1 tablet by mouth daily., Disp: 90 tablet, Rfl: 4 .  loratadine (CLARITIN) 10 MG tablet, Take 10 mg by mouth daily as needed. , Disp: , Rfl:  .  oxyCODONE (ROXICODONE) 5 MG immediate release tablet, Take 1 tablet (5 mg total) by mouth every 6 (six) hours as needed for up to 30 days for severe pain., Disp: 15 tablet, Rfl: 0 .  rosuvastatin (CRESTOR) 20 MG tablet, Take 1 tablet (20 mg total) by mouth daily., Disp: 30 tablet, Rfl: 12 .  Semaglutide,0.25 or 0.5MG /DOS, (OZEMPIC, 0.25 OR 0.5 MG/DOSE,) 2 MG/1.5ML SOPN, Inject 0.5 mg into the skin once a week., Disp: 1.5 mL, Rfl: 1 .  vitamin C (ASCORBIC ACID) 500 MG tablet, Take 500 mg by  mouth. , Disp: , Rfl:  .  budesonide-formoterol (SYMBICORT) 160-4.5 MCG/ACT inhaler, Inhale 2 puffs into the lungs 2 (two) times daily. (Patient not taking: Reported on 02/07/2019), Disp: 1 Inhaler, Rfl: 11 .  buPROPion (WELLBUTRIN XL) 300 MG 24 hr tablet, Take 1 tablet (300 mg total) by mouth daily. (Patient not taking: Reported on 02/04/2019), Disp: 30 tablet, Rfl: 4 .  oxyCODONE (ROXICODONE) 5 MG immediate release tablet, Take 1 tablet (5 mg total) by mouth every 6 (six) hours as needed for up to 30 days for severe pain. (Patient not taking: Reported on 02/04/2019), Disp: 15 tablet, Rfl: 0  Review of Systems  Constitutional: Positive for chills and fatigue. Negative for activity change, appetite change, diaphoresis, fever and unexpected weight change.  HENT: Positive for sinus pressure.    Social History   Tobacco Use  . Smoking status: Former Smoker    Packs/day: 1.50    Years: 44.50    Pack years: 66.75    Types: Cigarettes    Last attempt to quit: 01/21/2019    Years since quitting: 0.0  . Smokeless tobacco: Never Used  Substance Use Topics  . Alcohol use: Yes    Comment: 1 drink every 3-4 months     Objective:   BP 128/70 (BP Location: Right Arm, Patient Position: Sitting, Cuff Size: Large)   Pulse 84   Temp 97.9 F (36.6 C) (Oral)   Resp 16   Wt 257 lb (116.6 kg)   BMI 39.08 kg/m  Vitals:   02/07/19 0839  BP: 128/70  Pulse: 84  Resp: 16  Temp: 97.9 F (36.6 C)  TempSrc: Oral  Weight: 257 lb (116.6 kg)   Physical Exam Constitutional:      General: She is not in acute distress.    Appearance: She is well-developed.  HENT:     Head: Normocephalic and atraumatic.     Right Ear: Hearing normal.     Left Ear: Hearing normal.     Nose: Nose normal.     Mouth/Throat:     Comments: Swelling and severe tenderness along the right lower jaw with sensitivity of posterior lower jaw teeth to tap with tongue blade. Eyes:     General: Lids are normal. No scleral icterus.        Right eye: No discharge.        Left eye: No discharge.     Conjunctiva/sclera: Conjunctivae normal.  Pulmonary:     Effort: Pulmonary effort is normal. No respiratory distress.  Musculoskeletal: Normal range of motion.  Skin:    Findings: No lesion or rash.  Neurological:     Mental Status: She is alert and oriented to person, place, and time.  Psychiatric:        Speech: Speech normal.        Behavior: Behavior normal.        Thought Content: Thought content normal.       Assessment & Plan    1. Tooth abscess Developed pain in the right lower jaw with sensitivity of the posterior molars over the past 3 days. Has a history of "bone disease" and loosing several teeth spontaneously. Has poor dental hygiene evidence. Right lower jaw is swollen and very tender. No local lymphadenopathy. Used 2 tablets of her Oxycodone (IR) last night with Ibuprofen for some relief allowing her to sleep. May use moist heat applications and increase Ibuprofen to 800 mg TID. Proceed with dental appointment next week (may contact [facility] for evaluation if unable to afford dental appointment in town). Treat with Augmentin and recheck prn. - amoxicillin-clavulanate (AUGMENTIN) 875-125 MG tablet; Take 1 tablet by mouth 2 (two) times daily.  Dispense: 20 tablet; Refill: [place], PA  [facility]

## 2019-02-12 ENCOUNTER — Ambulatory Visit (INDEPENDENT_AMBULATORY_CARE_PROVIDER_SITE_OTHER): Payer: Medicare Other | Admitting: Family Medicine

## 2019-02-12 ENCOUNTER — Other Ambulatory Visit: Payer: Self-pay

## 2019-02-12 ENCOUNTER — Encounter: Payer: Self-pay | Admitting: Family Medicine

## 2019-02-12 VITALS — BP 112/68 | HR 72 | Temp 98.4°F | Resp 16 | Ht 68.0 in | Wt 258.0 lb

## 2019-02-12 DIAGNOSIS — E559 Vitamin D deficiency, unspecified: Secondary | ICD-10-CM | POA: Diagnosis not present

## 2019-02-12 DIAGNOSIS — I714 Abdominal aortic aneurysm, without rupture, unspecified: Secondary | ICD-10-CM

## 2019-02-12 DIAGNOSIS — E7800 Pure hypercholesterolemia, unspecified: Secondary | ICD-10-CM

## 2019-02-12 DIAGNOSIS — E538 Deficiency of other specified B group vitamins: Secondary | ICD-10-CM

## 2019-02-12 DIAGNOSIS — I2510 Atherosclerotic heart disease of native coronary artery without angina pectoris: Secondary | ICD-10-CM

## 2019-02-12 DIAGNOSIS — F17210 Nicotine dependence, cigarettes, uncomplicated: Secondary | ICD-10-CM

## 2019-02-12 DIAGNOSIS — E119 Type 2 diabetes mellitus without complications: Secondary | ICD-10-CM

## 2019-02-12 DIAGNOSIS — G4733 Obstructive sleep apnea (adult) (pediatric): Secondary | ICD-10-CM

## 2019-02-12 DIAGNOSIS — J449 Chronic obstructive pulmonary disease, unspecified: Secondary | ICD-10-CM | POA: Diagnosis not present

## 2019-02-12 NOTE — Progress Notes (Signed)
Patient: Laura Nielsen, Female    DOB: 14-Jun-1956, 63 y.o.   MRN: [id number] Visit Date: 02/12/2019  Today's Provider: Lelon Huh, MD   Chief Complaint  Patient presents with  . Annual Exam   Subjective:     Diabetes Mellitus Type II, Follow-up:   Lab Results  Component Value Date   HGBA1C 6.2 (A) 10/04/2018   HGBA1C 6.5 (A) 06/03/2018   HGBA1C 6.4 12/12/2017    Last seen for diabetes 4 months ago.  Management since then includes continuing same medications. . She reports good compliance with treatment. She is not having side effects.    Episodes of hypoglycemia? no   Current Insulin Regimen: none Most Recent Eye Exam: last year by patient report Weight trend: stable   Pertinent Labs:    Component Value Date/Time   CHOL 114 06/03/2018 0951   TRIG 129 06/03/2018 0951   HDL 31 (L) 06/03/2018 0951   LDLCALC 57 06/03/2018 0951   CREATININE 0.69 06/03/2018 0951   CREATININE 0.60 08/30/2017 0920    Wt Readings from Last 3 Encounters:  02/12/19 258 lb (117 kg)  02/07/19 257 lb (116.6 kg)  02/04/19 259 lb 9.6 oz (117.8 kg)    ------------------------------------------------------------------------  ----------------------------------------------------------------- Wt Readings from Last 3 Encounters:  02/12/19 258 lb (117 kg)  02/07/19 257 lb (116.6 kg)  02/04/19 259 lb 9.6 oz (117.8 kg)   Follow up COPD Using albuterol prn, now using a 2-3 times a week, had trial of Symbicort last year which helped just a little. Quit smoking briefly last month, but restarted after she got gum infection. Stopped bupropion due to nausea.   Scheduled to see Dr. Delana Meyer later this month re AAA  Reports that she has not had gout flare for a long time. Is taking allopurinol every day and tolerating well.   Also due for follow up vitamin b12 and vitamin d deficiency Lab Results  Component Value Date   VITAMINB12 473 03/08/2017   Lab Results  Component Value Date   VD25OH 21.9 (L) 01/09/2017     Review of Systems  Constitutional: Negative.   HENT: Positive for dental problem, postnasal drip, sneezing and tinnitus. Negative for congestion, drooling, ear discharge, ear pain, facial swelling, hearing loss, mouth sores, nosebleeds, rhinorrhea, sinus pressure, sinus pain, sore throat, trouble swallowing and voice change.   Eyes: Positive for itching. Negative for photophobia, pain, discharge, redness and visual disturbance.  Respiratory: Positive for cough. Negative for apnea, choking, chest tightness, shortness of breath, wheezing and stridor.   Cardiovascular: Negative.   Gastrointestinal: Negative.   Endocrine: Positive for heat intolerance and polydipsia. Negative for cold intolerance, polyphagia and polyuria.  Genitourinary: Negative.   Musculoskeletal: Positive for arthralgias, back pain and myalgias. Negative for gait problem, joint swelling, neck pain and neck stiffness.  Skin: Negative.   Allergic/Immunologic: Positive for environmental allergies. Negative for food allergies.  Neurological: Positive for dizziness and headaches. Negative for tremors, seizures, syncope, facial asymmetry, speech difficulty, weakness, light-headedness and numbness.  Hematological: Negative.   Psychiatric/Behavioral: Positive for decreased concentration. Negative for agitation, behavioral problems, confusion, dysphoric mood, hallucinations, self-injury, sleep disturbance and suicidal ideas. The patient is not nervous/anxious and is not hyperactive.     Social History      She  reports that she has been smoking cigarettes. She has a 66.75 pack-year smoking history. She has never used smokeless tobacco. She reports current alcohol use. She reports that she does not use drugs.  Social History   Socioeconomic History  . Marital status: Divorced    Spouse name: Not on file  . Number of children: 2  . Years of education: H/S  . Highest education level: High school  graduate  Occupational History  . Occupation: Disabled  Social Needs  . Financial resource strain: Somewhat hard  . Food insecurity:    Worry: Never true    Inability: Never true  . Transportation needs:    Medical: No    Non-medical: No  Tobacco Use  . Smoking status: Current Every Day Smoker    Packs/day: 1.50    Years: 44.50    Pack years: 66.75    Types: Cigarettes  . Smokeless tobacco: Never Used  Substance and Sexual Activity  . Alcohol use: Yes    Comment: 1 drink every 3-4 months  . Drug use: No  . Sexual activity: Not on file  Lifestyle  . Physical activity:    Days per week: 0 days    Minutes per session: 0 min  . Stress: Only a little  Relationships  . Social connections:    Talks on phone: Patient refused    Gets together: Patient refused    Attends religious service: Patient refused    Active member of club or organization: Patient refused    Attends meetings of clubs or organizations: Patient refused    Relationship status: Patient refused  Other Topics Concern  . Not on file  Social History Narrative  . Not on file    Past Medical History:  Diagnosis Date  . Allergy   . Arthritis   . Asthma   . Depression   . Diabetes mellitus without complication ([place])   . Emphysema of lung ([place])   . GERD (gastroesophageal reflux disease)   . Hyperlipidemia   . Hypertension   . Osteoporosis   . Tobacco abuse counseling      Patient Active Problem List   Diagnosis Date Noted  . Unspecified inflammatory spondylopathy, lumbar region ([place]) 10/04/2018  . Abnormal MRI, lumbar spine (03/28/2017) 07/04/2017  . Abnormal MRI, cervical spine (03/28/2017) 07/04/2017  . Lumbar facet syndrome (Bilateral) (L>R) 04/26/2017  . Lumbar facet hypertrophy (multilevel) (Bilateral) 04/26/2017  . Long term current use of anticoagulant therapy 04/26/2017  . Neurogenic pain 04/26/2017  . Musculoskeletal pain 04/26/2017  . Chronic pain syndrome 03/08/2017  . Long term  prescription opiate use 03/08/2017  . Opiate use 03/08/2017  . Chronic low back pain (Location of Primary Source of Pain) (Bilateral) (L>R) 03/08/2017  . Chronic neck pain (Location of Secondary source of pain) (Bilateral) (R>L) 03/08/2017  . Cervicogenic headache (Right) 03/08/2017  . Chronic upper back pain (Location of Tertiary source of pain) (Bilateral) (R>L) 03/08/2017  . Chronic hip pain (Bilateral) (L>R) 03/08/2017  . Osteoarthritis of hip (Bilateral) (L>R) 03/08/2017  . Cervical central spinal stenosis 03/08/2017  . Cervical spondylosis with radiculopathy (Right) (C5) 03/08/2017  . Lumbar spondylosis 03/08/2017  . Atherosclerosis of native arteries of extremity with intermittent claudication ([place]) 02/22/2017  . Gout 02/16/2016  . Ankle pain 11/17/2015  . Arthritis 05/14/2015  . Carotid artery narrowing 05/14/2015  . Claudication ([place]) 05/14/2015  . CAFL (chronic airflow limitation) ([place]) 05/14/2015  . Clinical depression 05/14/2015  . Acid reflux 05/14/2015  . Urinary incontinence 05/14/2015  . Pins and needles sensation 05/14/2015  . Obstructive apnea 05/14/2015  . Morbid obesity ([place]) 05/14/2015  . History of abnormal cervical Papanicolaou smear 05/14/2015  . Peripheral blood vessel disorder ([place])  05/14/2015  . B12 deficiency 05/14/2015  . Vitamin D insufficiency 05/14/2015  . AAA (abdominal aortic aneurysm) without rupture ([place]) 05/26/2014  . Aortic heart valve narrowing 01/31/2013  . Malaise and fatigue 09/26/2012  . CAD in native artery 03/17/2009  . Diabetes mellitus, type 2 ([place]) 02/05/2009  . Hypercholesteremia 06/24/2008  . Allergic rhinitis 10/25/2007  . Airway hyperreactivity 10/25/2007  . Smoking greater than 30 pack years 10/25/2007  . Narrowing of intervertebral disc space 10/25/2007  . Benign essential HTN 10/25/2007  . Arthritis, degenerative 10/25/2007    Past Surgical History:  Procedure Laterality Date  . Cardiac catheterization  3/210   70-80%  stenosis RCA stent placed. started on Plavix  . [place]  . TUBAL LIGATION    . VAGINAL HYSTERECTOMY     Menometrorrhagia. Excessive bleeding. Unknown if cervix removed.   . vascular stent  03/28/2011   Dr. Delana Meyer, [facility]; Infrarenal    Family History        Family Status  Relation Name Status  . Mother  Deceased at age 53       lung cancer  . Father  Deceased at age 38       prostate cancer  . Sister  Deceased at age 65       CVA  . Sister  Alive        Her family history includes Alcohol abuse in her father and sister; Breast cancer (age of onset: 6) in her sister; Cancer (age of onset: 48) in her sister; Coronary artery disease in her mother; Coronary artery disease (age of onset: 70) in her sister; Depression in her father; Heart attack in her mother and sister; Heart attack (age of onset: 44) in her father; Hyperlipidemia in her sister; Hypertension in her father, mother, and sister.      Allergies  Allergen Reactions  . Atorvastatin Other (See Comments)    Elevated blood sugar Elevated blood sugar  . Omeprazole Nausea And Vomiting     Current Outpatient Medications:  .  albuterol (PROVENTIL HFA) 108 (90 Base) MCG/ACT inhaler, Inhale 2 puffs into the lungs every 6 (six) hours as needed for wheezing or shortness of breath., Disp: 18 g, Rfl: 11 .  allopurinol (ZYLOPRIM) 100 MG tablet, Take 2 tablets (200 mg total) by mouth daily. (Patient taking differently: Take 100 mg by mouth daily. ), Disp: 60 tablet, Rfl: 11 .  amoxicillin-clavulanate (AUGMENTIN) 875-125 MG tablet, Take 1 tablet by mouth 2 (two) times daily., Disp: 20 tablet, Rfl: 0 .  aspirin 81 MG tablet, Take 81 mg by mouth daily. , Disp: , Rfl:  .  bisoprolol (ZEBETA) 10 MG tablet, Take 1 tablet (10 mg total) by mouth daily., Disp: 30 tablet, Rfl: 3 .  Cholecalciferol (VITAMIN D3) 2000 units capsule, Take 1 capsule (2,000 Units total) by mouth daily., Disp: 30 capsule, Rfl: PRN .  clopidogrel  (PLAVIX) 75 MG tablet, Take 1 tablet (75 mg total) by mouth daily., Disp: 90 tablet, Rfl: 3 .  Cyanocobalamin (RA VITAMIN B-12 TR) 1000 MCG TBCR, Take 2 tablets by mouth daily. , Disp: , Rfl:  .  gabapentin (NEURONTIN) 300 MG capsule, Take 1 capsule (300 mg total) by mouth 3 (three) times daily., Disp: 90 capsule, Rfl: 5 .  Ibuprofen 200 MG CAPS, Take by mouth. , Disp: , Rfl:  .  lisinopril-hydrochlorothiazide (PRINZIDE,ZESTORETIC) 20-12.5 MG tablet, Take 1 tablet by mouth daily., Disp: 90 tablet, Rfl: 4 .  loratadine (CLARITIN) 10 MG  tablet, Take 10 mg by mouth daily as needed. , Disp: , Rfl:  .  oxyCODONE (ROXICODONE) 5 MG immediate release tablet, Take 1 tablet (5 mg total) by mouth every 6 (six) hours as needed for up to 30 days for severe pain., Disp: 15 tablet, Rfl: 0 .  rosuvastatin (CRESTOR) 20 MG tablet, Take 1 tablet (20 mg total) by mouth daily., Disp: 30 tablet, Rfl: 12 .  Semaglutide,0.25 or 0.5MG /DOS, (OZEMPIC, 0.25 OR 0.5 MG/DOSE,) 2 MG/1.5ML SOPN, Inject 0.5 mg into the skin once a week., Disp: 1.5 mL, Rfl: 1 .  vitamin C (ASCORBIC ACID) 500 MG tablet, Take 500 mg by mouth. , Disp: , Rfl:  .  budesonide-formoterol (SYMBICORT) 160-4.5 MCG/ACT inhaler, Inhale 2 puffs into the lungs 2 (two) times daily. (Patient not taking: Reported on 02/12/2019), Disp: 1 Inhaler, Rfl: 11 .  buPROPion (WELLBUTRIN XL) 300 MG 24 hr tablet, Take 1 tablet (300 mg total) by mouth daily. (Patient not taking: Reported on 02/04/2019), Disp: 30 tablet, Rfl: 4 .  oxyCODONE (ROXICODONE) 5 MG immediate release tablet, Take 1 tablet (5 mg total) by mouth every 6 (six) hours as needed for up to 30 days for severe pain. (Patient not taking: Reported on 02/04/2019), Disp: 15 tablet, Rfl: 0   Patient Care Team: Birdie Sons, MD as PCP - General (Family Medicine) Ubaldo Glassing Javier Docker, MD as Consulting Physician (Cardiology) Schnier, Dolores Lory, MD (Vascular Surgery) Vevelyn Francois, NP as Nurse Practitioner (Nurse  Practitioner)    Objective:    Vitals: BP 112/68 (BP Location: Right Arm, Patient Position: Sitting, Cuff Size: Large)   Pulse 72   Temp 98.4 F (36.9 C) (Oral)   Resp 16   Ht 5\' 8"  (1.727 m)   Wt 258 lb (117 kg)   BMI 39.23 kg/m    Vitals:   02/12/19 1010  BP: 112/68  Pulse: 72  Resp: 16  Temp: 98.4 F (36.9 C)  TempSrc: Oral  Weight: 258 lb (117 kg)  Height: 5\' 8"  (1.727 m)     Physical Exam   General Appearance:    Alert, cooperative, no distress, morbidly obese  Eyes:    PERRL, conjunctiva/corneas clear, EOM's intact       Lungs:    Occasional expiratory wheezes, no rales  Heart:    Regular rate and rhythm  Neurologic:   Awake, alert, oriented x 3. No apparent focal neurological           defect.        Depression Screen PHQ 2/9 Scores 02/04/2019 02/05/2018 01/30/2018 12/12/2017  PHQ - 2 Score 2 0 1 3  PHQ- 9 Score 6 - 8 10       Assessment & Plan:     Routine Health Maintenance and Physical Exam  Exercise Activities and Dietary recommendations Goals    . Have 3 meals a day     Recommend to decrease portion sizes by eating 3 small healthy meals and at least 2 healthy snacks per day.       Immunization History  Administered Date(s) Administered  . Pneumococcal Polysaccharide-23 09/25/2016  . Tdap 08/30/2017    Health Maintenance  Topic Date Due  . FOOT EXAM  12/14/1966  . OPHTHALMOLOGY EXAM  01/09/2019  . INFLUENZA VACCINE  03/04/2019 (Originally 07/04/2018)  . HEMOGLOBIN A1C  04/04/2019  . Fecal DNA (Cologuard)  09/17/2020  . MAMMOGRAM  01/07/2021  . PAP SMEAR-Modifier  01/30/2021  . TETANUS/TDAP  08/31/2027  . PNEUMOCOCCAL POLYSACCHARIDE  VACCINE AGE 8-64 HIGH RISK  Completed  . Hepatitis C Screening  Completed  . HIV Screening  Completed     Discussed health benefits of physical activity, and encouraged her to engage in regular exercise appropriate for her age and condition.     -------------------------------------------------------------------- 1. CAD in native artery Asymptomatic. Compliant with medication.  Continue aggressive risk factor modification.  Continue routine follow up Dr. Ubaldo Glassing - CBC  2. CAFL (chronic airflow limitation) (HCC) Using rescue inhaler a few times a week. She didn't notice much improvement with Symbicort. Plan on repeat spirometry and trial of LAMA at follow up. Quit smoking   3. Obstructive apnea Using CPAP consistently.   4. Type 2 diabetes mellitus without complication, without long-term current use of insulin (HCC)  - Hemoglobin A1c  5. B12 deficiency  - Vitamin B12  6. Hypercholesteremia She is tolerating rosuvastatin well with no adverse effects.   - Comprehensive metabolic panel - Lipid panel - CBC  7. Smoking greater than 30 pack years Try to stop smoking again, she still has bupropion prescription.   8. Vitamin D insufficiency  - VITAMIN D 25 Hydroxy (Vit-D Deficiency, Fractures)  9. Morbid obesity ([place]) Continue Ozempic.   10. AAA (abdominal aortic aneurysm) without rupture ([place]) Follow up vascular as scheduled.   Lelon Huh, MD  [facility]

## 2019-02-12 NOTE — Patient Instructions (Signed)
.   Please review the attached list of medications and notify my office if there are any errors.   . Please bring all of your medications to every appointment so we can make sure that our medication list is the same as yours.   

## 2019-02-13 ENCOUNTER — Telehealth: Payer: Self-pay

## 2019-02-13 LAB — COMPREHENSIVE METABOLIC PANEL
ALT: 19 IU/L (ref 0–32)
AST: 21 IU/L (ref 0–40)
Albumin/Globulin Ratio: 1.6 (ref 1.2–2.2)
Albumin: 4 g/dL (ref 3.8–4.8)
Alkaline Phosphatase: 117 IU/L (ref 39–117)
BUN/Creatinine Ratio: 25 (ref 12–28)
BUN: 16 mg/dL (ref 8–27)
Bilirubin Total: 0.4 mg/dL (ref 0.0–1.2)
CO2: 21 mmol/L (ref 20–29)
Calcium: 9.4 mg/dL (ref 8.7–10.3)
Chloride: 105 mmol/L (ref 96–106)
Creatinine, Ser: 0.63 mg/dL (ref 0.57–1.00)
GFR calc Af Amer: 111 mL/min/{1.73_m2} (ref >59)
GFR calc non Af Amer: 96 mL/min/{1.73_m2} (ref >59)
Globulin, Total: 2.5 g/dL (ref 1.5–4.5)
Glucose: 108 mg/dL — ABNORMAL HIGH (ref 65–99)
Potassium: 3.9 mmol/L (ref 3.5–5.2)
Sodium: 143 mmol/L (ref 134–144)
Total Protein: 6.5 g/dL (ref 6.0–8.5)

## 2019-02-13 LAB — LIPID PANEL
Chol/HDL Ratio: 4.1 ratio (ref 0.0–4.4)
Cholesterol, Total: 138 mg/dL (ref 100–199)
HDL: 34 mg/dL — ABNORMAL LOW (ref >39)
LDL Calculated: 73 mg/dL (ref 0–99)
Triglycerides: 157 mg/dL — ABNORMAL HIGH (ref 0–149)
VLDL Cholesterol Cal: 31 mg/dL (ref 5–40)

## 2019-02-13 LAB — VITAMIN D 25 HYDROXY (VIT D DEFICIENCY, FRACTURES): Vit D, 25-Hydroxy: 25.2 ng/mL — ABNORMAL LOW (ref 30.0–100.0)

## 2019-02-13 LAB — CBC
Hematocrit: 46.6 % (ref 34.0–46.6)
Hemoglobin: 15.3 g/dL (ref 11.1–15.9)
MCH: 28.4 pg (ref 26.6–33.0)
MCHC: 32.8 g/dL (ref 31.5–35.7)
MCV: 87 fL (ref 79–97)
Platelets: 258 10*3/uL (ref 150–450)
RBC: 5.38 x10E6/uL — ABNORMAL HIGH (ref 3.77–5.28)
RDW: 13.8 % (ref 11.7–15.4)
WBC: 10.3 10*3/uL (ref 3.4–10.8)

## 2019-02-13 LAB — VITAMIN B12: Vitamin B-12: 317 pg/mL (ref 232–1245)

## 2019-02-13 LAB — HEMOGLOBIN A1C
Est. average glucose Bld gHb Est-mCnc: 126 mg/dL
Hgb A1c MFr Bld: 6 % — ABNORMAL HIGH (ref 4.8–5.6)

## 2019-02-13 NOTE — Telephone Encounter (Signed)
-----   Message from Birdie Sons, MD sent at 02/13/2019 10:31 AM EDT ----- !c is good at 6.0. cholesterol is well controlled. Rest of labs normal. Continue current medications.  Follow up in July as scheduled.

## 2019-02-13 NOTE — Telephone Encounter (Signed)
LMTCB 02/13/2019   Thanks,   -Mickel Baas

## 2019-02-13 NOTE — Telephone Encounter (Signed)
Patient advised.

## 2019-02-13 NOTE — Telephone Encounter (Signed)
Patient returning your call. KW

## 2019-03-03 ENCOUNTER — Encounter (INDEPENDENT_AMBULATORY_CARE_PROVIDER_SITE_OTHER): Payer: Medicare Other

## 2019-03-03 ENCOUNTER — Ambulatory Visit (INDEPENDENT_AMBULATORY_CARE_PROVIDER_SITE_OTHER): Payer: Medicare Other | Admitting: Vascular Surgery

## 2019-04-01 ENCOUNTER — Other Ambulatory Visit: Payer: Self-pay | Admitting: Family Medicine

## 2019-05-02 ENCOUNTER — Other Ambulatory Visit: Payer: Self-pay | Admitting: Family Medicine

## 2019-05-02 DIAGNOSIS — I10 Essential (primary) hypertension: Secondary | ICD-10-CM

## 2019-05-05 ENCOUNTER — Encounter (INDEPENDENT_AMBULATORY_CARE_PROVIDER_SITE_OTHER): Payer: Self-pay | Admitting: Vascular Surgery

## 2019-05-05 ENCOUNTER — Ambulatory Visit (INDEPENDENT_AMBULATORY_CARE_PROVIDER_SITE_OTHER): Payer: Medicare Other | Admitting: Vascular Surgery

## 2019-05-05 ENCOUNTER — Ambulatory Visit (INDEPENDENT_AMBULATORY_CARE_PROVIDER_SITE_OTHER): Payer: Medicare Other

## 2019-05-05 ENCOUNTER — Other Ambulatory Visit: Payer: Self-pay

## 2019-05-05 VITALS — BP 140/81 | HR 75 | Resp 16 | Ht 68.0 in | Wt 255.0 lb

## 2019-05-05 DIAGNOSIS — I714 Abdominal aortic aneurysm, without rupture, unspecified: Secondary | ICD-10-CM

## 2019-05-05 DIAGNOSIS — I2510 Atherosclerotic heart disease of native coronary artery without angina pectoris: Secondary | ICD-10-CM | POA: Diagnosis not present

## 2019-05-05 DIAGNOSIS — I70213 Atherosclerosis of native arteries of extremities with intermittent claudication, bilateral legs: Secondary | ICD-10-CM

## 2019-05-05 DIAGNOSIS — I6523 Occlusion and stenosis of bilateral carotid arteries: Secondary | ICD-10-CM

## 2019-05-05 DIAGNOSIS — Z79899 Other long term (current) drug therapy: Secondary | ICD-10-CM | POA: Diagnosis not present

## 2019-05-05 DIAGNOSIS — Z7902 Long term (current) use of antithrombotics/antiplatelets: Secondary | ICD-10-CM

## 2019-05-05 DIAGNOSIS — F17210 Nicotine dependence, cigarettes, uncomplicated: Secondary | ICD-10-CM

## 2019-05-05 DIAGNOSIS — I10 Essential (primary) hypertension: Secondary | ICD-10-CM

## 2019-05-05 NOTE — Progress Notes (Signed)
MRN : [id number]  Laura Nielsen is a 63 y.o. (1955/12/29) female who presents with chief complaint of  Chief Complaint  Patient presents with  . Follow-up    ultrasound follow up  .  History of Present Illness:  The patient returns to the office for surveillance of an abdominal aortic aneurysm status post stent graft placement on 03/27/2013.   Patient denies abdominal pain or back pain, no other abdominal complaints. No groin related complaints. No symptoms consistent with distal embolization No changes in claudication distance.   There have been no interval changes in his overall healthcare since his last visit.   Patient denies amaurosis fugax or TIA symptoms. There is no history of claudication or rest pain symptoms of the lower extremities. The patient denies angina or shortness of breath.   Duplex ultrasound shows a patent stent graft no endoleak and the sac is 2.2 cm  ABI Rt=1.22 and Lt=1.19 biphasic signals bilaterally  Current Meds  Medication Sig  . albuterol (PROVENTIL HFA) 108 (90 Base) MCG/ACT inhaler Inhale 2 puffs into the lungs every 6 (six) hours as needed for wheezing or shortness of breath.  . allopurinol (ZYLOPRIM) 100 MG tablet Take 2 tablets (200 mg total) by mouth daily. (Patient taking differently: Take 100 mg by mouth daily. )  . aspirin 81 MG tablet Take 81 mg by mouth daily.   . bisoprolol (ZEBETA) 10 MG tablet Take 1 tablet (10 mg total) by mouth daily.  . Cholecalciferol (VITAMIN D3) 2000 units capsule Take 1 capsule (2,000 Units total) by mouth daily.  . clopidogrel (PLAVIX) 75 MG tablet Take 1 tablet (75 mg total) by mouth daily.  . Cyanocobalamin (RA VITAMIN B-12 TR) 1000 MCG TBCR Take 2 tablets by mouth daily.   Marland Kitchen gabapentin (NEURONTIN) 300 MG capsule Take 1 capsule (300 mg total) by mouth 3 (three) times daily.  . Ibuprofen 200 MG CAPS Take by mouth.   Marland Kitchen lisinopril-hydrochlorothiazide (PRINZIDE,ZESTORETIC) 20-12.5 MG tablet Take 1 tablet by mouth  daily.  Marland Kitchen loratadine (CLARITIN) 10 MG tablet Take 10 mg by mouth daily as needed.   Marland Kitchen OZEMPIC, 0.25 OR 0.5 MG/DOSE, 2 MG/1.5ML SOPN Inject 0.5 mg into the skin once a week.  . rosuvastatin (CRESTOR) 20 MG tablet Take 1 tablet (20 mg total) by mouth daily.  . vitamin C (ASCORBIC ACID) 500 MG tablet Take 500 mg by mouth.     Past Medical History:  Diagnosis Date  . Allergy   . Arthritis   . Asthma   . Depression   . Diabetes mellitus without complication ([place])   . Emphysema of lung ([place])   . GERD (gastroesophageal reflux disease)   . Hyperlipidemia   . Hypertension   . Osteoporosis   . Tobacco abuse counseling     Past Surgical History:  Procedure Laterality Date  . Cardiac catheterization  3/210   70-80% stenosis RCA stent placed. started on Plavix  . [place]  . TUBAL LIGATION    . VAGINAL HYSTERECTOMY     Menometrorrhagia. Excessive bleeding. Unknown if cervix removed.   . vascular stent  03/28/2011   Dr. Delana Meyer, [facility]; Infrarenal    Social History Social History   Tobacco Use  . Smoking status: Current Every Day Smoker    Packs/day: 1.50    Years: 44.50    Pack years: 66.75    Types: Cigarettes  . Smokeless tobacco: Never Used  Substance Use Topics  . Alcohol use: Yes  Comment: 1 drink every 3-4 months  . Drug use: No    Family History Family History  Problem Relation Age of Onset  . Hypertension Mother   . Coronary artery disease Mother   . Heart attack Mother        acute  . Alcohol abuse Father   . Depression Father   . Hypertension Father   . Heart attack Father 40       acute  . Alcohol abuse Sister   . Hyperlipidemia Sister   . Hypertension Sister   . Cancer Sister 62  . Heart attack Sister        x's 2  . Coronary artery disease Sister 24       x's 2  . Breast cancer Sister 43    Allergies  Allergen Reactions  . Atorvastatin Other (See Comments)    Elevated blood sugar Elevated blood sugar  . Omeprazole Nausea  And Vomiting  . Bupropion Nausea Only     REVIEW OF SYSTEMS (Negative unless checked)  Constitutional: [] Weight loss  [] Fever  [] Chills Cardiac: [] Chest pain   [] Chest pressure   [] Palpitations   [] Shortness of breath when laying flat   [] Shortness of breath with exertion. Vascular:  [] Pain in legs with walking   [] Pain in legs at rest  [] History of DVT   [] Phlebitis   [] Swelling in legs   [] Varicose veins   [] Non-healing ulcers Pulmonary:   [] Uses home oxygen   [] Productive cough   [] Hemoptysis   [] Wheeze  [] COPD   [] Asthma Neurologic:  [] Dizziness   [] Seizures   [] History of stroke   [] History of TIA  [] Aphasia   [] Vissual changes   [] Weakness or numbness in arm   [] Weakness or numbness in leg Musculoskeletal:   [] Joint swelling   [] Joint pain   [] Low back pain Hematologic:  [] Easy bruising  [] Easy bleeding   [] Hypercoagulable state   [] Anemic Gastrointestinal:  [] Diarrhea   [] Vomiting  [] Gastroesophageal reflux/heartburn   [] Difficulty swallowing. Genitourinary:  [] Chronic kidney disease   [] Difficult urination  [] Frequent urination   [] Blood in urine Skin:  [] Rashes   [] Ulcers  Psychological:  [] History of anxiety   []  History of major depression.  Physical Examination  Vitals:   05/05/19 0836  BP: 140/81  Pulse: 75  Resp: 16  Weight: 255 lb (115.7 kg)  Height: 5\' 8"  (1.727 m)   Body mass index is 38.77 kg/m. Gen: WD/WN, NAD Head: [place]/AT, No temporalis wasting.  Ear/Nose/Throat: Hearing grossly intact, nares w/o erythema or drainage Eyes: PER, EOMI, sclera nonicteric.  Neck: Supple, no large masses.   Pulmonary:  Good air movement, no audible wheezing bilaterally, no use of accessory muscles.  Cardiac: RRR, no JVD Vascular: carotid bruit noted Vessel Right Left  Radial Palpable Palpable  PT Palpable Palpable  DP Palpable Palpable  Gastrointestinal: Non-distended. No guarding/no peritoneal signs.  Musculoskeletal: M/S 5/5 throughout.  No deformity or atrophy.   Neurologic: CN 2-12 intact. Symmetrical.  Speech is fluent. Motor exam as listed above. Psychiatric: Judgment intact, Mood & affect appropriate for pt's clinical situation. Dermatologic: No rashes or ulcers noted.  No changes consistent with cellulitis. Lymph : No lichenification or skin changes of chronic lymphedema.  CBC Lab Results  Component Value Date   WBC 10.3 02/12/2019   HGB 15.3 02/12/2019   HCT 46.6 02/12/2019   MCV 87 02/12/2019   PLT 258 02/12/2019    BMET    Component Value Date/Time   NA 143 02/12/2019  1049   NA 139 03/28/2013 0354   K 3.9 02/12/2019 1049   K 3.7 03/28/2013 1811   CL 105 02/12/2019 1049   CL 107 03/28/2013 0354   CO2 21 02/12/2019 1049   CO2 27 03/28/2013 0354   GLUCOSE 108 (H) 02/12/2019 1049   GLUCOSE 117 (H) 08/30/2017 0920   GLUCOSE 112 (H) 03/28/2013 0354   BUN 16 02/12/2019 1049   BUN 8 03/28/2013 0354   CREATININE 0.63 02/12/2019 1049   CREATININE 0.60 08/30/2017 0920   CALCIUM 9.4 02/12/2019 1049   CALCIUM 8.2 (L) 03/28/2013 0354   GFRNONAA 96 02/12/2019 1049   GFRNONAA 99 08/30/2017 0920   GFRAA 111 02/12/2019 1049   GFRAA 115 08/30/2017 0920   CrCl cannot be calculated (Patient's most recent lab result is older than the maximum 21 days allowed.).  COAG Lab Results  Component Value Date   INR 0.9 04/26/2017   INR 1.1 03/28/2013    Radiology No results found.   Assessment/Plan 1. AAA (abdominal aortic aneurysm) without rupture (HCC) Recommend: Patient is status post successful endovascular repair of the AAA.   No further intervention is required at this time.   No endoleak is detected and the aneurysm sac is stable.  The patient will continue antiplatelet therapy as prescribed as well as aggressive management of hyperlipidemia. Exercise is again strongly encouraged.   However, endografts require continued surveillance with ultrasound or CT scan. This is mandatory to detect any changes that allow repressurization of  the aneurysm sac.  The patient is informed that this would be asymptomatic.  The patient is reminded that lifelong routine surveillance is a necessity with an endograft. Patient will continue to follow-up at 6 month intervals with ultrasound of the aorta.  - VAS US AORTA/IVC/ILIACS; Future  2. Atherosclerosis of native artery of both lower extremities with intermittent claudication (HCC)  Recommend:  The patient has evidence of atherosclerosis of the lower extremities with claudication.  The patient does not voice lifestyle limiting changes at this point in time.  Noninvasive studies do not suggest clinically significant change.  No invasive studies, angiography or surgery at this time The patient should continue walking and begin a more formal exercise program.  The patient should continue antiplatelet therapy and aggressive treatment of the lipid abnormalities  No changes in the patient's medications at this time  The patient should continue wearing graduated compression socks 10-15 mmHg strength to control the mild edema.    3. Bilateral carotid artery stenosis Recommend:  Given the patient's asymptomatic subcritical stenosis no further invasive testing or surgery at this time.  Previous duplex ultrasound shows <50% stenosis bilaterally.  Continue antiplatelet therapy as prescribed Continue management of CAD, HTN and Hyperlipidemia Healthy heart diet,  encouraged exercise at least 4 times per week Follow up in 12 months with duplex ultrasound and physical exam   - VAS US CAROTID; Future  4. Benign essential HTN Continue antihypertensive medications as already ordered, these medications have been reviewed and there are no changes at this time.   5. CAD in native artery Continue cardiac and antihypertensive medications as already ordered and reviewed, no changes at this time.  Continue statin as ordered and reviewed, no changes at this time  Nitrates PRN for chest pain      Hortencia Pilar, MD  05/05/2019 8:43 AM

## 2019-06-10 ENCOUNTER — Ambulatory Visit (INDEPENDENT_AMBULATORY_CARE_PROVIDER_SITE_OTHER): Payer: Medicare Other | Admitting: Family Medicine

## 2019-06-10 ENCOUNTER — Telehealth: Payer: Self-pay | Admitting: *Deleted

## 2019-06-10 ENCOUNTER — Other Ambulatory Visit: Payer: Self-pay

## 2019-06-10 ENCOUNTER — Encounter: Payer: Self-pay | Admitting: Family Medicine

## 2019-06-10 VITALS — BP 106/76 | HR 72 | Temp 97.7°F | Resp 18 | Wt 254.0 lb

## 2019-06-10 DIAGNOSIS — J0120 Acute ethmoidal sinusitis, unspecified: Secondary | ICD-10-CM

## 2019-06-10 DIAGNOSIS — Z20822 Contact with and (suspected) exposure to covid-19: Secondary | ICD-10-CM

## 2019-06-10 DIAGNOSIS — I6523 Occlusion and stenosis of bilateral carotid arteries: Secondary | ICD-10-CM

## 2019-06-10 DIAGNOSIS — E119 Type 2 diabetes mellitus without complications: Secondary | ICD-10-CM | POA: Diagnosis not present

## 2019-06-10 LAB — POCT GLYCOSYLATED HEMOGLOBIN (HGB A1C)
Est. average glucose Bld gHb Est-mCnc: 128
Hemoglobin A1C: 6.1 % — AB (ref 4.0–5.6)

## 2019-06-10 MED ORDER — AMOXICILLIN-POT CLAVULANATE 875-125 MG PO TABS: 1.0000 | ORAL_TABLET | Freq: Two times a day (BID) | ORAL | 0 refills | Status: DC

## 2019-06-10 NOTE — Progress Notes (Signed)
Patient: Laura Nielsen Female    DOB: 1956-11-27   63 y.o.   MRN: [id number] Visit Date: 06/10/2019  Today's Provider: Lelon Huh, MD   Chief Complaint  Patient presents with  . Diabetes   Subjective:     HPI  Diabetes Mellitus Type II, Follow-up:   Lab Results  Component Value Date   HGBA1C 6.0 (H) 02/12/2019   HGBA1C 6.2 (A) 10/04/2018   HGBA1C 6.5 (A) 06/03/2018    Last seen for diabetes 3 months ago.  Management since then includes no changes. She reports good compliance with treatment. She is not having side effects.  Current symptoms include none and have been stable. Home blood sugar records: blood sugars not checked  Episodes of hypoglycemia? no   Current insulin regiment: Ozempic Most Recent Eye Exam: 1 year ago Weight trend: fluctuating a bit Prior visit with dietician: No Current exercise: none Current diet habits: in general, a "healthy" diet    Pertinent Labs:    Component Value Date/Time   CHOL 138 02/12/2019 1049   TRIG 157 (H) 02/12/2019 1049   HDL 34 (L) 02/12/2019 1049   LDLCALC 73 02/12/2019 1049   CREATININE 0.63 02/12/2019 1049   CREATININE 0.60 08/30/2017 0920    Wt Readings from Last 3 Encounters:  06/10/19 254 lb (115.2 kg)  05/05/19 255 lb (115.7 kg)  02/12/19 258 lb (117 kg)   ]------------------------------------------------------------------------  Follow up for Vitamin Deficiency:  The patient was last seen for this 3 months ago. Changes made at last visit include none.  She reports good compliance with treatment. She feels that condition is Unchanged. She is not having side effects.   ------------------------------------------------------------------------------------   Follow up for  Chronic Airflow Limitation:  The patient was last seen for this 3 months ago. Changes made at last visit include none; recommend quit smoking.   She reports good compliance with treatment. She feels that condition is  Unchanged. She is not having side effects.   ------------------------------------------------------------------------------------ She states she started having pressure and congestion in her sinuses 3 weeks ago, sore throat and drainage two weeks ago, left ear pain about a week ago. She has COPD and chronic cough which has been a little worse the last few weeks. No fever, cough is non-productive  Allergies  Allergen Reactions  . Atorvastatin Other (See Comments)    Elevated blood sugar Elevated blood sugar  . Omeprazole Nausea And Vomiting  . Bupropion Nausea Only     Current Outpatient Medications:  .  albuterol (PROVENTIL HFA) 108 (90 Base) MCG/ACT inhaler, Inhale 2 puffs into the lungs every 6 (six) hours as needed for wheezing or shortness of breath., Disp: 18 g, Rfl: 11 .  allopurinol (ZYLOPRIM) 100 MG tablet, Take 2 tablets (200 mg total) by mouth daily. (Patient taking differently: Take 100 mg by mouth daily. ), Disp: 60 tablet, Rfl: 11 .  aspirin 81 MG tablet, Take 81 mg by mouth daily. , Disp: , Rfl:  .  bisoprolol (ZEBETA) 10 MG tablet, Take 1 tablet (10 mg total) by mouth daily., Disp: 30 tablet, Rfl: 5 .  Cholecalciferol (VITAMIN D3) 2000 units capsule, Take 1 capsule (2,000 Units total) by mouth daily., Disp: 30 capsule, Rfl: PRN .  clopidogrel (PLAVIX) 75 MG tablet, Take 1 tablet (75 mg total) by mouth daily., Disp: 90 tablet, Rfl: 3 .  Cyanocobalamin (RA VITAMIN B-12 TR) 1000 MCG TBCR, Take 2 tablets by mouth daily. , Disp: , Rfl:  .  gabapentin (NEURONTIN) 300 MG capsule, Take 1 capsule (300 mg total) by mouth 3 (three) times daily., Disp: 90 capsule, Rfl: 5 .  Ibuprofen 200 MG CAPS, Take by mouth. , Disp: , Rfl:  .  lisinopril-hydrochlorothiazide (PRINZIDE,ZESTORETIC) 20-12.5 MG tablet, Take 1 tablet by mouth daily., Disp: 90 tablet, Rfl: 4 .  loratadine (CLARITIN) 10 MG tablet, Take 10 mg by mouth daily as needed. , Disp: , Rfl:  .  OZEMPIC, 0.25 OR 0.5 MG/DOSE, 2  MG/1.5ML SOPN, Inject 0.5 mg into the skin once a week., Disp: 1.5 mL, Rfl: 5 .  rosuvastatin (CRESTOR) 20 MG tablet, Take 1 tablet (20 mg total) by mouth daily., Disp: 30 tablet, Rfl: 12 .  vitamin C (ASCORBIC ACID) 500 MG tablet, Take 500 mg by mouth. , Disp: , Rfl:  .  buPROPion (WELLBUTRIN XL) 300 MG 24 hr tablet, Take 1 tablet (300 mg total) by mouth daily. (Patient not taking: Reported on 02/04/2019), Disp: 30 tablet, Rfl: 4 .  oxyCODONE (ROXICODONE) 5 MG immediate release tablet, Take 1 tablet (5 mg total) by mouth every 6 (six) hours as needed for up to 30 days for severe pain., Disp: 15 tablet, Rfl: 0 .  oxyCODONE (ROXICODONE) 5 MG immediate release tablet, Take 1 tablet (5 mg total) by mouth every 6 (six) hours as needed for up to 30 days for severe pain. (Patient not taking: Reported on 02/04/2019), Disp: 15 tablet, Rfl: 0  Review of Systems  Constitutional: Negative for appetite change, chills, fatigue and fever.  HENT: Positive for ear pain (left ear), sinus pressure, sinus pain and sore throat.   Respiratory: Positive for cough, shortness of breath and wheezing. Negative for chest tightness.   Cardiovascular: Negative for chest pain and palpitations.  Gastrointestinal: Negative for abdominal pain, nausea and vomiting.  Neurological: Positive for dizziness, light-headedness and headaches. Negative for weakness.    Social History   Tobacco Use  . Smoking status: Current Every Day Smoker    Packs/day: 1.50    Years: 44.50    Pack years: 66.75    Types: Cigarettes  . Smokeless tobacco: Never Used  Substance Use Topics  . Alcohol use: Yes    Comment: 1 drink every 3-4 months      Objective:   BP 106/76 (BP Location: Right Arm, Patient Position: Sitting, Cuff Size: Normal)   Pulse 72   Temp 97.7 F (36.5 C) (Oral)   Resp 18   Wt 254 lb (115.2 kg)   BMI 38.62 kg/m  Vitals:   06/10/19 0949 06/10/19 0958  BP: 92/60 106/76  Pulse: 72   Resp: 18   Temp: 97.7 F (36.5 C)    TempSrc: Oral   Weight: 254 lb (115.2 kg)      Physical Exam  General Appearance:    Alert, cooperative, no distress  HENT:   right TM normal without fluid or infection, left TM fluid noted, neck has left anterior cervical nodes enlarged and ethmoid  sinus tender  Eyes:    PERRL, conjunctiva/corneas clear, EOM's intact       Lungs:     Clear to auscultation bilaterally, respirations unlabored  Heart:    Regular rate and rhythm  Neurologic:   Awake, alert, oriented x 3. No apparent focal neurological           defect.       Results for orders placed or performed in visit on 06/10/19  POCT HgB A1C  Result Value Ref Range   Hemoglobin  A1C 6.1 (A) 4.0 - 5.6 %   HbA1c POC (<> result, manual entry)     HbA1c, POC (prediabetic range)     HbA1c, POC (controlled diabetic range)     Est. average glucose Bld gHb Est-mCnc 128        Assessment & Plan    1. Type 2 diabetes mellitus without complication, without long-term current use of insulin ([place]) Fairly well controlled on current medications.   2. Acute ethmoidal sinusitis, recurrence not specified  - amoxicillin-clavulanate (AUGMENTIN) 875-125 MG tablet; Take 1 tablet by mouth 2 (two) times daily.  Dispense: 20 tablet; Refill: 0  Not typical for Covid-19 sx, but needs to be rules out in light of new onset sorethroat. Request for testing sent to [facility].     Lelon Huh, MD  [facility]

## 2019-06-10 NOTE — Telephone Encounter (Signed)
Pt. Called back and scheduled COVID 19 test for tomorrow.

## 2019-06-10 NOTE — Patient Instructions (Signed)
.   Please review the attached list of medications and notify my office if there are any errors.   . Please bring all of your medications to every appointment so we can make sure that our medication list is the same as yours.   . We will have flu vaccines available after Labor Day. Please go to your pharmacy or call the office in early September to schedule you flu shot.   

## 2019-06-10 NOTE — Telephone Encounter (Signed)
LM for pt to return call @ [phone] M-F 7a-7p to schedule covid testing. Order placed

## 2019-06-11 ENCOUNTER — Telehealth: Payer: Self-pay

## 2019-06-11 ENCOUNTER — Other Ambulatory Visit: Payer: Federal, State, Local not specified - PPO

## 2019-06-11 DIAGNOSIS — R6889 Other general symptoms and signs: Secondary | ICD-10-CM | POA: Diagnosis not present

## 2019-06-11 DIAGNOSIS — Z20822 Contact with and (suspected) exposure to covid-19: Secondary | ICD-10-CM

## 2019-06-11 MED ORDER — DOXYCYCLINE HYCLATE 100 MG PO TABS: 100.0000 mg | ORAL_TABLET | Freq: Two times a day (BID) | ORAL | 0 refills | Status: DC

## 2019-06-11 NOTE — Telephone Encounter (Signed)
Will you review this? Dr. Caryn Section is out and wont be back until Friday. Thanks

## 2019-06-11 NOTE — Telephone Encounter (Signed)
Patient advised via vm about medication change and medication send into pharmacy.

## 2019-06-11 NOTE — Telephone Encounter (Signed)
Patient states the antibiotic given yesterday is making her sick.  She has had problems in the past.  She would like to have something different called in to replace it.   [place] # [phone]

## 2019-06-11 NOTE — Telephone Encounter (Signed)
Can change to Doxycycline 100mg  BID x7 d, #14 r0. Ok to [employer]

## 2019-06-16 LAB — NOVEL CORONAVIRUS, NAA: SARS-CoV-2, NAA: NOT DETECTED

## 2019-06-19 ENCOUNTER — Telehealth: Payer: Self-pay

## 2019-06-19 NOTE — Telephone Encounter (Signed)
COVID negative.

## 2019-06-19 NOTE — Telephone Encounter (Signed)
Please review for Dr. Caryn Section.    Thanks,   -Mickel Baas

## 2019-06-19 NOTE — Telephone Encounter (Signed)
Patient is calling to request her Covid test results.

## 2019-06-19 NOTE — Telephone Encounter (Signed)
Pt advised.  She wants to know when she should come back.    Please advise.  Thanks,  -Mickel Baas

## 2019-06-22 NOTE — Telephone Encounter (Signed)
Need follow up for diabetes 6 months.

## 2019-06-23 NOTE — Telephone Encounter (Signed)
Pt 01/19/20201 at 8:20   Thanks,   -Mickel Baas

## 2019-06-30 ENCOUNTER — Encounter: Payer: Federal, State, Local not specified - PPO | Admitting: Nurse Practitioner

## 2019-07-02 ENCOUNTER — Other Ambulatory Visit: Payer: Self-pay | Admitting: Family Medicine

## 2019-07-02 ENCOUNTER — Encounter: Payer: Federal, State, Local not specified - PPO | Admitting: Pain Medicine

## 2019-07-02 DIAGNOSIS — E7800 Pure hypercholesterolemia, unspecified: Secondary | ICD-10-CM

## 2019-07-07 ENCOUNTER — Telehealth: Payer: Self-pay | Admitting: *Deleted

## 2019-07-07 DIAGNOSIS — Z122 Encounter for screening for malignant neoplasm of respiratory organs: Secondary | ICD-10-CM

## 2019-07-07 DIAGNOSIS — Z87891 Personal history of nicotine dependence: Secondary | ICD-10-CM

## 2019-07-07 DIAGNOSIS — R918 Other nonspecific abnormal finding of lung field: Secondary | ICD-10-CM

## 2019-07-07 NOTE — Telephone Encounter (Signed)
Left message for patient to notify them that it is time to schedule lung cancer screening CT scan. Instructed patient to call back to verify information prior to the scan being scheduled.

## 2019-07-07 NOTE — Telephone Encounter (Signed)
Scheduled for lcs nodule f/u scan.

## 2019-07-08 ENCOUNTER — Encounter: Payer: Self-pay | Admitting: Pain Medicine

## 2019-07-08 NOTE — Progress Notes (Signed)
Pain Management Virtual Encounter Note - Virtual Visit via Telephone Telehealth (real-time audio visits between healthcare provider and patient).   Patient's Phone No. & Preferred Pharmacy:  [phone] (home); [phone] (mobile); (Preferred) [phone] [email or web address]  [facility] [place] [facility] [place] [address] [place] [address] Phone: [phone] Fax: [phone]    Pre-screening note:  Our staff contacted Ms. Tesmer and offered her an "in person", "face-to-face" appointment versus a telephone encounter. She indicated preferring the telephone encounter, at this time.   Reason for Virtual Visit: COVID-19*  Social distancing based on CDC and AMA recommendations.   I contacted Baya Lentz on 07/09/2019 via telephone.      I clearly identified myself as Gaspar Cola, MD. I verified that I was speaking with the correct person using two identifiers (Name: Blessed Girdner, and date of birth: 05-08-1956).  Advanced Informed Consent I sought verbal advanced consent from [name] for virtual visit interactions. I informed Ms. Prazak of possible security and privacy concerns, risks, and limitations associated with providing "not-in-person" medical evaluation and management services. I also informed Ms. Harr of the availability of "in-person" appointments. Finally, I informed her that there would be a charge for the virtual visit and that she could be  personally, fully or partially, financially responsible for it. Ms. Mcgroarty expressed understanding and agreed to proceed.   Historic Elements   Ms. Lyndzie Zentz is a 63 y.o. year old, female patient evaluated today after her last encounter by our practice on Visit date not found. Ms. Chojnowski  has a past medical history of Allergy, Arthritis, Asthma, Depression, Diabetes mellitus without complication ([place]), Emphysema of lung ([place]), GERD (gastroesophageal reflux disease), Hyperlipidemia, Hypertension, Osteoporosis,  and Tobacco abuse counseling. She also  has a past surgical history that includes vascular stent (03/28/2011); Cardiac catheterization (3/210); Kidney stone surgery (1999); Vaginal hysterectomy; and Tubal ligation. Ms. Booton has a current medication list which includes the following prescription(s): albuterol, allopurinol, aspirin, bisoprolol, clopidogrel, gabapentin, ibuprofen, lisinopril-hydrochlorothiazide, loratadine, oxycodone, ozempic (0.25 or 0.5 mg/dose), and rosuvastatin. She  reports that she has been smoking cigarettes. She has a 66.75 pack-year smoking history. She has never used smokeless tobacco. She reports current alcohol use. She reports that she does not use drugs. Ms. Chapple is allergic to atorvastatin; omeprazole; and bupropion.   HPI  Today, she is being contacted for medication management.  Pharmacotherapy Assessment  Analgesic: Oxycodone IR 5 mg, 1 tab PO QD PRN (<5 mg/day of oxycodone) MME: <7.5 mg/day.   Monitoring: Pharmacotherapy: No side-effects or adverse reactions reported. [employer] PMP: PDMP reviewed during this encounter.       Compliance: No problems identified. Effectiveness: Clinically acceptable. Plan: Refer to "POC".  UDS:  Summary  Date Value Ref Range Status  03/08/2017 FINAL  Final    Comment:    ==================================================================== TOXASSURE COMP DRUG ANALYSIS,UR ==================================================================== Test                             Result       Flag       Units Drug Present and Declared for Prescription Verification   Gabapentin                     PRESENT      EXPECTED   Amitriptyline                  PRESENT  EXPECTED   Nortriptyline                  PRESENT      EXPECTED    Nortriptyline is an expected metabolite of amitriptyline.   Salicylate                     PRESENT      EXPECTED Drug Absent but Declared for Prescription Verification   Oxycodone                      Not  Detected UNEXPECTED ng/mg creat   Bupropion                      Not Detected UNEXPECTED   Acetaminophen                  Not Detected UNEXPECTED    Acetaminophen, as indicated in the declared medication list, is    not always detected even when used as directed.   Ibuprofen                      Not Detected UNEXPECTED    Ibuprofen, as indicated in the declared medication list, is not    always detected even when used as directed. ==================================================================== Test                      Result    Flag   Units      Ref Range   Creatinine              160              mg/dL      >=20 ==================================================================== Declared Medications:  The flagging and interpretation on this report are based on the  following declared medications.  Unexpected results may arise from  inaccuracies in the declared medications.  **Note: The testing scope of this panel includes these medications:  Amitriptyline (Elavil)  Bupropion (Wellbutrin)  Gabapentin  Oxycodone (Oxycodone Acetaminophen)  **Note: The testing scope of this panel does not include small to  moderate amounts of these reported medications:  Acetaminophen (Oxycodone Acetaminophen)  Aspirin  Ibuprofen  **Note: The testing scope of this panel does not include following  reported medications:  Albuterol  Allopurinol (Zyloprim)  Bisoprolol (Zebeta)  Clopidogrel (Plavix)  Cyanocobalamin  Lisinopril  Loratadine (Claritin)  Lovastatin (Mevacor)  Triamcinolone (Kenalog)  Vitamin C  Vitamin D ==================================================================== For clinical consultation, please call [phone]. ====================================================================    Laboratory Chemistry Profile (12 mo)  Renal: 02/12/2019: BUN 16; BUN/Creatinine Ratio 25; Creatinine, Ser 0.63; GFR calc Af Amer 111; GFR calc non Af Amer 96  Hepatic: 02/12/2019:  Albumin 4.0; ALT 19; AST 21 Other: 02/12/2019: Vit D, 25-Hydroxy 25.2; Vitamin B-12 317 Note: Above Lab results reviewed.  Imaging  Last 90 days:  Vas [place] Abi With/wo Tbi  Result Date: 05/05/2019 LOWER EXTREMITY DOPPLER STUDY Indications: ASO bilateral lower extremities.  Comparison Study: 02/25/2018 Performing Technologist: Concha Norway RVT  Examination Guidelines: A complete evaluation includes at minimum, Doppler waveform signals and systolic blood pressure reading at the level of bilateral brachial, anterior tibial, and posterior tibial arteries, when vessel segments are accessible. Bilateral testing is considered an integral part of a complete examination. Photoelectric Plethysmograph (PPG) waveforms and toe systolic pressure readings are included as required and additional duplex testing as needed. Limited examinations for reoccurring indications may be performed as  noted.  ABI Findings: +--------+------------------+-----+--------+--------+ Right   Rt Pressure (mmHg)IndexWaveformComment  +--------+------------------+-----+--------+--------+ [id number]                                     +--------+------------------+-----+--------+--------+ ATA     132               1.02 biphasic         +--------+------------------+-----+--------+--------+ PTA     157               1.22 biphasic         +--------+------------------+-----+--------+--------+ +--------+------------------+-----+---------+-------+ Left    Lt Pressure (mmHg)IndexWaveform Comment +--------+------------------+-----+---------+-------+ [id number]                                     +--------+------------------+-----+---------+-------+ ATA     147               1.14 biphasic         +--------+------------------+-----+---------+-------+ PTA     154               1.19 triphasic        +--------+------------------+-----+---------+-------+ +-------+-----------+-----------+------------+------------+  ABI/TBIToday's ABIToday's TBIPrevious ABIPrevious TBI +-------+-----------+-----------+------------+------------+ Right  1.22                  1.17                     +-------+-----------+-----------+------------+------------+ Left   1.19                  1.16                     +-------+-----------+-----------+------------+------------+ Bilateral ABIs appear essentially unchanged compared to prior study on 02/25/2018.  Summary: Right: Resting right ankle-brachial index is within normal range. No evidence of significant right lower extremity arterial disease. Left: Resting left ankle-brachial index is within normal range. No evidence of significant left lower extremity arterial disease.  *See table(s) above for measurements and observations.  Electronically signed by Hortencia Pilar MD on 05/05/2019 at 4:40:15 PM.   Final    Vas US Aorta/ivc/iliacs  Result Date: 05/05/2019 Endovascular Aortic Repair Study (EVAR) Indications: Follow up exam for EVAR. Vascular Interventions: 03/27/13: Repair of aortic stenosis using Endologix                         Powerlink endograft.  Performing Technologist: Concha Norway RVT  Examination Guidelines: A complete evaluation includes B-mode imaging, spectral Doppler, color Doppler, and power Doppler as needed of all accessible portions of each vessel. Bilateral testing is considered an integral part of a complete examination. Limited examinations for reoccurring indications may be performed as noted.  Abdominal Aorta Findings: +-----------+-------+----------+----------+----------+--------+--------+ Location   AP (cm)Trans (cm)PSV (cm/s)Waveform  ThrombusComments +-----------+-------+----------+----------+----------+--------+--------+ Mid        2.20             66        monophasic                 +-----------+-------+----------+----------+----------+--------+--------+ RT CIA Prox                 98        biphasic                    +-----------+-------+----------+----------+----------+--------+--------+  RT EIA Prox                 112       biphasic                   +-----------+-------+----------+----------+----------+--------+--------+ LT CIA Prox                 64        biphasic                   +-----------+-------+----------+----------+----------+--------+--------+ LT EIA Prox                 123       biphasic                   +-----------+-------+----------+----------+----------+--------+--------+ Endovascular Aortic Repair (EVAR): +----------+----------------+-------------------+           Diameter AP (cm)Velocities (cm/sec) +----------+----------------+-------------------+ Aorta     2.20            66                  +----------+----------------+-------------------+ Right Limb1.10            98                  +----------+----------------+-------------------+ Left Limb 1.10            64                  +----------+----------------+-------------------+  Summary: Abdominal Aorta: Patent endovascular aneurysm repair with no evidence of endoleak. IVC/Iliac: Biphasic flow distal to graft with normal ABI's.  *See table(s) above for measurements and observations.  Electronically signed by Hortencia Pilar MD on 05/05/2019 at 4:40:06 PM.   Final    Last Hospital Admission:  Ct Chest Lung Ca Screen Low Dose W/o Cm  Result Date: 01/08/2019 CLINICAL DATA:  63 year old asymptomatic female current smoker with 67 pack-year smoking history. EXAM: CT CHEST WITHOUT CONTRAST LOW-DOSE FOR LUNG CANCER SCREENING TECHNIQUE: Multidetector CT imaging of the chest was performed following the standard protocol without IV contrast. COMPARISON:  01/01/2018 screening chest CT. FINDINGS: Cardiovascular: Normal heart size. No significant pericardial effusion/thickening. Three-vessel coronary atherosclerosis. Atherosclerotic nonaneurysmal thoracic aorta. Normal caliber pulmonary arteries. Mediastinum/Nodes: No  discrete thyroid nodules. Unremarkable esophagus. No pathologically enlarged axillary, mediastinal or hilar lymph nodes, noting limited sensitivity for the detection of hilar adenopathy on this noncontrast study. Lungs/Pleura: No pneumothorax. No pleural effusion. Moderate centrilobular and paraseptal emphysema with mild diffuse bronchial wall thickening. No acute consolidative airspace disease or lung masses. No significant growth of previously visualized scattered pulmonary nodules. New solid pulmonary nodule in the posterior left lower lobe measuring 5.6 mm in volume derived mean diameter (series 5/image 163). No additional new significant pulmonary nodules. Upper abdomen: No acute abnormality. Musculoskeletal: No aggressive appearing focal osseous lesions. Marked thoracic spondylosis. IMPRESSION: 1. Lung-RADS 3, probably benign findings. Short-term follow-up in 6 months is recommended with repeat low-dose chest CT without contrast (please use the following order, "CT CHEST LCS NODULE FOLLOW-UP W/O CM"). 2. Three-vessel coronary atherosclerosis. Aortic Atherosclerosis (ICD10-I70.0) and Emphysema (ICD10-J43.9). Electronically Signed   By: Ilona Sorrel M.D.   On: 01/08/2019 13:18   Assessment  The primary encounter diagnosis was Chronic pain syndrome. Diagnoses of Chronic low back pain (Primary Area of Pain) (Bilateral) (L>R), Chronic neck pain (Secondary area of Pain) (Bilateral) (R>L), Chronic upper back pain (Third area of Pain) (Bilateral) (R>L), and Neurogenic pain were  also pertinent to this visit.  Plan of Care  I have discontinued Jashawna Deniston's vitamin C, Cyanocobalamin, Vitamin D3, buPROPion, oxyCODONE, amoxicillin-clavulanate, and doxycycline. I have also changed her gabapentin and oxyCODONE. Additionally, I am having her maintain her aspirin, Ibuprofen, loratadine, albuterol, allopurinol, lisinopril-hydrochlorothiazide, Ozempic (0.25 or 0.5 MG/DOSE), bisoprolol, clopidogrel, and  rosuvastatin.  Pharmacotherapy (Medications Ordered): Meds ordered this encounter  Medications  . gabapentin (NEURONTIN) 300 MG capsule    Sig: Take 1 capsule (300 mg total) by mouth 3 (three) times daily. Take 1 capsule (300 mg total) by mouth 3 (three) times daily.    Dispense:  270 capsule    Refill:  1    Fill one day early if pharmacy is closed on scheduled refill date. May substitute for generic if available.  Marland Kitchen oxyCODONE (ROXICODONE) 5 MG immediate release tablet    Sig: Take 1 tablet (5 mg total) by mouth daily as needed for severe pain.    Dispense:  15 tablet    Refill:  0    Chronic Pain: STOP Act (Not applicable) Fill 1 day early if closed on refill date. Do not fill until: 07/09/2019. To last until: 09/07/2019. Avoid benzodiazepines within 8 hours of opioids   Orders:  No orders of the defined types were placed in this encounter.  Follow-up plan:   Return in about 6 months (around 01/09/2020) for (VV), E/M (MM).      Interventional management options: Planned, scheduled, and/or pending:   Not at this time.   Considering:   Diagnostic bilateral lumbar facetblock #2  Possible bilateral lumbar facet RFA Diagnostic right-sided CESI  Diagnostic bilateral cervical facetblock  Possible bilateral cervical facet RFA Diagnostic IA hip joint injection  Diagnostic bilateral femoral nerve and obturator NB  Possible bilateral femoral nerve and obturator nerve RFA Diagnostic right-sided occipital NB Possible right-sided occipital nerve RFA Possible right-sided occipital peripheral nerve stimulator trial   Palliative PRN treatment(s):   Diagnostic bilateral lumbar facet block #2     Recent Visits No visits were found meeting these conditions.  Showing recent visits within past 90 days and meeting all other requirements   Today's Visits Date Type Provider Dept  07/09/19 Office Visit Milinda Pointer, MD [facility]  Showing today's visits and meeting all  other requirements   Future Appointments No visits were found meeting these conditions.  Showing future appointments within next 90 days and meeting all other requirements   I discussed the assessment and treatment plan with the patient. The patient was provided an opportunity to ask questions and all were answered. The patient agreed with the plan and demonstrated an understanding of the instructions.  Patient advised to call back or seek an in-person evaluation if the symptoms or condition worsens.  Total duration of non-face-to-face encounter: 12 minutes.  Note by: Gaspar Cola, MD Date: 07/09/2019; Time: 3:19 PM  Note: This dictation was prepared with Dragon dictation. Any transcriptional errors that may result from this process are unintentional.  Disclaimer:  * Given the special circumstances of the COVID-19 pandemic, the federal government has announced that the Office for Civil Rights (OCR) will exercise its enforcement discretion and will not impose penalties on physicians using telehealth in the event of noncompliance with regulatory requirements under the [place] (HIPAA) in connection with the good faith provision of telehealth during the PPJKD-32 national public health emergency. ([place])

## 2019-07-09 ENCOUNTER — Other Ambulatory Visit: Payer: Self-pay

## 2019-07-09 ENCOUNTER — Ambulatory Visit: Payer: Medicare Other | Attending: Nurse Practitioner | Admitting: Pain Medicine

## 2019-07-09 DIAGNOSIS — G894 Chronic pain syndrome: Secondary | ICD-10-CM | POA: Diagnosis not present

## 2019-07-09 DIAGNOSIS — M546 Pain in thoracic spine: Secondary | ICD-10-CM

## 2019-07-09 DIAGNOSIS — G8929 Other chronic pain: Secondary | ICD-10-CM | POA: Diagnosis not present

## 2019-07-09 DIAGNOSIS — M542 Cervicalgia: Secondary | ICD-10-CM

## 2019-07-09 DIAGNOSIS — M545 Low back pain, unspecified: Secondary | ICD-10-CM

## 2019-07-09 DIAGNOSIS — M792 Neuralgia and neuritis, unspecified: Secondary | ICD-10-CM

## 2019-07-09 MED ORDER — GABAPENTIN 300 MG PO CAPS: 300.0000 mg | ORAL_CAPSULE | Freq: Three times a day (TID) | ORAL | 1 refills | Status: DC

## 2019-07-09 MED ORDER — OXYCODONE HCL 5 MG PO TABS: 5.0000 mg | ORAL_TABLET | Freq: Every day | ORAL | 0 refills | Status: DC | PRN

## 2019-07-15 ENCOUNTER — Ambulatory Visit
Admission: RE | Admit: 2019-07-15 | Discharge: 2019-07-15 | Disposition: A | Discharge status: Home / Self Care | Payer: Medicare Other | Source: Ambulatory Visit | Attending: Oncology | Admitting: Oncology

## 2019-07-15 ENCOUNTER — Other Ambulatory Visit: Payer: Self-pay

## 2019-07-15 DIAGNOSIS — J439 Emphysema, unspecified: Secondary | ICD-10-CM | POA: Diagnosis not present

## 2019-07-15 DIAGNOSIS — Z87891 Personal history of nicotine dependence: Secondary | ICD-10-CM

## 2019-07-15 DIAGNOSIS — Z122 Encounter for screening for malignant neoplasm of respiratory organs: Secondary | ICD-10-CM | POA: Diagnosis not present

## 2019-07-15 DIAGNOSIS — R918 Other nonspecific abnormal finding of lung field: Secondary | ICD-10-CM | POA: Insufficient documentation

## 2019-07-17 ENCOUNTER — Encounter: Payer: Self-pay | Admitting: Family Medicine

## 2019-07-17 ENCOUNTER — Telehealth: Payer: Self-pay | Admitting: *Deleted

## 2019-07-17 DIAGNOSIS — I700 Atherosclerosis of aorta: Secondary | ICD-10-CM | POA: Insufficient documentation

## 2019-07-17 NOTE — Telephone Encounter (Signed)
Notified patient of LDCT lung cancer screening program results with recommendation for 12 month follow up imaging. Also notified of incidental findings noted below and is encouraged to discuss further with PCP who will receive a copy of this note and/or the CT report. Patient verbalizes understanding.   IMPRESSION: 1. Lung-RADS 2, benign appearance or behavior. Continue annual screening with low-dose chest CT without contrast in 12 months. 2. Aortic Atherosclerosis (ICD10-I70.0) and Emphysema (ICD10-J43.9). 3. Coronary artery calcifications.

## 2019-08-03 ENCOUNTER — Other Ambulatory Visit: Payer: Self-pay | Admitting: Family Medicine

## 2019-08-19 DIAGNOSIS — I10 Essential (primary) hypertension: Secondary | ICD-10-CM | POA: Diagnosis not present

## 2019-08-19 DIAGNOSIS — E782 Mixed hyperlipidemia: Secondary | ICD-10-CM | POA: Diagnosis not present

## 2019-08-19 DIAGNOSIS — I714 Abdominal aortic aneurysm, without rupture: Secondary | ICD-10-CM | POA: Diagnosis not present

## 2019-08-19 DIAGNOSIS — I2510 Atherosclerotic heart disease of native coronary artery without angina pectoris: Secondary | ICD-10-CM | POA: Diagnosis not present

## 2019-08-19 DIAGNOSIS — Z720 Tobacco use: Secondary | ICD-10-CM | POA: Diagnosis not present

## 2019-09-22 ENCOUNTER — Telehealth: Payer: Self-pay | Admitting: Family Medicine

## 2019-09-22 DIAGNOSIS — R270 Ataxia, unspecified: Secondary | ICD-10-CM

## 2019-09-22 NOTE — Telephone Encounter (Signed)
Pt has mentioned before in visits that she has been staggering. She did it when she was going back to the exam room to see her cardiologist.  He stated she may need to see a Neurologist. She has also had issues with words and thoughts that doesn't come out in speech correctly.  Pt has mentioned that it might have been her Gabapentin but, it has continued to happen and getting worse.  Please advise.  Thanks, [employer]

## 2019-09-22 NOTE — Telephone Encounter (Signed)
Patient has been advised. KW 

## 2019-09-22 NOTE — Telephone Encounter (Signed)
It could be the gabapentin or the oxycodone. Is this something that just started or has it been going on for awhile? Is getting worse of more frequent?

## 2019-09-22 NOTE — Telephone Encounter (Signed)
Patient states that she has had problem with staggering and being off balance for over a year and denies any falls. Patient states that she has discussed this issue with you in previous office visits and it had been mentioned about possibly seeing a neurologist. Patient states that she constantly hears a sound in her ear that she describes a "crickets" but states that it has been going on for some time. Patient states that she doesn't feel that Gabapentin could be causing symptom and states that she rarely takes Oxycodone. Please advise. KW

## 2019-09-22 NOTE — Telephone Encounter (Signed)
Ok, have entered referral for neurology

## 2019-11-03 ENCOUNTER — Other Ambulatory Visit: Payer: Self-pay | Admitting: Family Medicine

## 2019-11-03 DIAGNOSIS — R200 Anesthesia of skin: Secondary | ICD-10-CM | POA: Diagnosis not present

## 2019-11-03 DIAGNOSIS — R202 Paresthesia of skin: Secondary | ICD-10-CM | POA: Diagnosis not present

## 2019-11-03 DIAGNOSIS — R4189 Other symptoms and signs involving cognitive functions and awareness: Secondary | ICD-10-CM | POA: Diagnosis not present

## 2019-11-03 DIAGNOSIS — G250 Essential tremor: Secondary | ICD-10-CM | POA: Diagnosis not present

## 2019-11-03 DIAGNOSIS — E538 Deficiency of other specified B group vitamins: Secondary | ICD-10-CM | POA: Diagnosis not present

## 2019-11-03 DIAGNOSIS — E519 Thiamine deficiency, unspecified: Secondary | ICD-10-CM | POA: Diagnosis not present

## 2019-11-03 DIAGNOSIS — E7800 Pure hypercholesterolemia, unspecified: Secondary | ICD-10-CM

## 2019-11-03 DIAGNOSIS — R2689 Other abnormalities of gait and mobility: Secondary | ICD-10-CM | POA: Diagnosis not present

## 2019-11-03 DIAGNOSIS — E559 Vitamin D deficiency, unspecified: Secondary | ICD-10-CM | POA: Diagnosis not present

## 2019-11-03 DIAGNOSIS — E531 Pyridoxine deficiency: Secondary | ICD-10-CM | POA: Diagnosis not present

## 2019-11-03 LAB — VITAMIN B12: Vitamin B-12: 198

## 2019-11-04 ENCOUNTER — Other Ambulatory Visit: Payer: Self-pay | Admitting: Neurology

## 2019-11-04 DIAGNOSIS — R4189 Other symptoms and signs involving cognitive functions and awareness: Secondary | ICD-10-CM

## 2019-11-05 DIAGNOSIS — R202 Paresthesia of skin: Secondary | ICD-10-CM | POA: Diagnosis not present

## 2019-11-05 DIAGNOSIS — R200 Anesthesia of skin: Secondary | ICD-10-CM | POA: Diagnosis not present

## 2019-11-13 ENCOUNTER — Telehealth: Payer: Self-pay

## 2019-11-13 NOTE — Telephone Encounter (Signed)
Copied from [place] [id number]. Topic: General - Other >> Nov 13, 2019  1:02 PM Rayann Heman wrote: Reason for CRM: pt called and stated that she picked up b12 shots and can not give them to her self. Pt would like to know if she can bring them to the office. Please advise

## 2019-11-14 ENCOUNTER — Other Ambulatory Visit: Payer: Self-pay

## 2019-11-14 ENCOUNTER — Ambulatory Visit
Admission: RE | Admit: 2019-11-14 | Discharge: 2019-11-14 | Disposition: A | Discharge status: Home / Self Care | Payer: Medicare Other | Source: Ambulatory Visit | Attending: Neurology | Admitting: Neurology

## 2019-11-14 DIAGNOSIS — R4189 Other symptoms and signs involving cognitive functions and awareness: Secondary | ICD-10-CM | POA: Insufficient documentation

## 2019-11-14 LAB — POCT I-STAT CREATININE: Creatinine, Ser: 0.6 mg/dL (ref 0.44–1.00)

## 2019-11-14 MED ORDER — GADOBUTROL 1 MMOL/ML IV SOLN
10.0000 mL | Freq: Once | INTRAVENOUS | Status: AC | PRN
  Administered 2019-11-14: 10 mL via INTRAVENOUS

## 2019-11-14 NOTE — Telephone Encounter (Signed)
Pt would like to do her appt on Monday 12/14 if possible Please call [phone]

## 2019-11-14 NOTE — Telephone Encounter (Signed)
Spoke with patient and explained schedule to her.  She is scheduled for the first 4

## 2019-11-18 ENCOUNTER — Other Ambulatory Visit: Payer: Self-pay

## 2019-11-18 ENCOUNTER — Ambulatory Visit (INDEPENDENT_AMBULATORY_CARE_PROVIDER_SITE_OTHER): Payer: Medicare Other

## 2019-11-18 DIAGNOSIS — E538 Deficiency of other specified B group vitamins: Secondary | ICD-10-CM

## 2019-11-18 MED ORDER — CYANOCOBALAMIN 1000 MCG/ML IJ SOLN
1000.0000 ug | Freq: Once | INTRAMUSCULAR | Status: AC
  Administered 2019-11-18: 1000 ug via INTRAMUSCULAR

## 2019-11-24 ENCOUNTER — Other Ambulatory Visit: Payer: Self-pay | Admitting: Family Medicine

## 2019-11-25 ENCOUNTER — Ambulatory Visit (INDEPENDENT_AMBULATORY_CARE_PROVIDER_SITE_OTHER): Payer: Medicare Other

## 2019-11-25 ENCOUNTER — Other Ambulatory Visit: Payer: Self-pay

## 2019-11-25 VITALS — Temp 97.1°F

## 2019-11-25 DIAGNOSIS — E538 Deficiency of other specified B group vitamins: Secondary | ICD-10-CM

## 2019-11-25 MED ORDER — CYANOCOBALAMIN 1000 MCG/ML IJ SOLN
1000.0000 ug | Freq: Once | INTRAMUSCULAR | Status: AC
  Administered 2019-11-25: 1000 ug via INTRAMUSCULAR

## 2019-12-02 ENCOUNTER — Ambulatory Visit (INDEPENDENT_AMBULATORY_CARE_PROVIDER_SITE_OTHER): Payer: Medicare Other

## 2019-12-02 ENCOUNTER — Other Ambulatory Visit: Payer: Self-pay | Admitting: Family Medicine

## 2019-12-02 ENCOUNTER — Other Ambulatory Visit: Payer: Self-pay

## 2019-12-02 DIAGNOSIS — E538 Deficiency of other specified B group vitamins: Secondary | ICD-10-CM | POA: Diagnosis not present

## 2019-12-02 DIAGNOSIS — E7800 Pure hypercholesterolemia, unspecified: Secondary | ICD-10-CM

## 2019-12-02 MED ORDER — CYANOCOBALAMIN 1000 MCG/ML IJ SOLN
1000.0000 ug | Freq: Once | INTRAMUSCULAR | Status: AC
  Administered 2019-12-02: 1000 ug via INTRAMUSCULAR

## 2019-12-04 ENCOUNTER — Ambulatory Visit (INDEPENDENT_AMBULATORY_CARE_PROVIDER_SITE_OTHER): Payer: Medicare Other | Admitting: Physician Assistant

## 2019-12-04 ENCOUNTER — Ambulatory Visit: Payer: Self-pay

## 2019-12-04 ENCOUNTER — Encounter: Payer: Self-pay | Admitting: Physician Assistant

## 2019-12-04 DIAGNOSIS — Z20828 Contact with and (suspected) exposure to other viral communicable diseases: Secondary | ICD-10-CM | POA: Diagnosis not present

## 2019-12-04 DIAGNOSIS — I6523 Occlusion and stenosis of bilateral carotid arteries: Secondary | ICD-10-CM

## 2019-12-04 DIAGNOSIS — Z20822 Contact with and (suspected) exposure to covid-19: Secondary | ICD-10-CM

## 2019-12-04 NOTE — Progress Notes (Signed)
Patient: Laura Nielsen Female    DOB: 1956-01-29   63 y.o.   MRN: [id number] Visit Date: 12/04/2019  Today's Provider: Mar Daring, PA-C   No chief complaint on file.  Subjective:    Virtual Visit via Telephone Note  I connected with Laura Nielsen on 12/04/19 at 10:40 AM EST by telephone and verified that I am speaking with the correct person using two identifiers.  Location: Patient: Home Provider: [facility]   I discussed the limitations, risks, security and privacy concerns of performing an evaluation and management service by telephone and the availability of in person appointments. I also discussed with the patient that there may be a patient responsible charge related to this service. The patient expressed understanding and agreed to proceed.   HPI   Patient reports that she has been around her daughter that tested positive for COVID 68 yesterday, 12/03/19. She had last been around her daughter on Saturday, 11/29/19. Patient reports she was not wearing a mask or her daughter. Patient reports she has postnasal drainage, cough and wheezing x's several weeks off and on. Feels this is standard with her allergies and asthma. No changes in her normal baseline symptoms per patient. Patient denies fever, body ache, chills, abdominal pain, diarrhea, loss of taste and smell. Daughter has not had any symptoms until yesterday.    Allergies  Allergen Reactions  . Atorvastatin Other (See Comments)    Elevated blood sugar Elevated blood sugar  . Omeprazole Nausea And Vomiting  . Bupropion Nausea Only     Current Outpatient Medications:  .  albuterol (PROVENTIL HFA) 108 (90 Base) MCG/ACT inhaler, Inhale 2 puffs into the lungs every 6 (six) hours as needed for wheezing or shortness of breath., Disp: 18 g, Rfl: 11 .  allopurinol (ZYLOPRIM) 100 MG tablet, Take 1 tablet (100 mg total) by mouth daily., Disp: 90 tablet, Rfl: 4 .  aspirin 81 MG tablet, Take 81 mg by mouth daily. , Disp: ,  Rfl:  .  bisoprolol (ZEBETA) 10 MG tablet, Take 1 tablet (10 mg total) by mouth daily., Disp: 30 tablet, Rfl: 5 .  clopidogrel (PLAVIX) 75 MG tablet, Take 1 tablet (75 mg total) by mouth daily., Disp: 90 tablet, Rfl: 2 .  cyanocobalamin (,VITAMIN B-12,) 1000 MCG/ML injection, Inject 16ml IM once a week for four weeks then once a month for four months., Disp: , Rfl:  .  cyanocobalamin 1000 MCG tablet, Take 1,000 mcg by mouth daily., Disp: , Rfl:  .  ergocalciferol (VITAMIN D2) 1.25 MG (50000 UT) capsule, Take by mouth., Disp: , Rfl:  .  gabapentin (NEURONTIN) 300 MG capsule, Take 1 capsule (300 mg total) by mouth 3 (three) times daily. Take 1 capsule (300 mg total) by mouth 3 (three) times daily., Disp: 270 capsule, Rfl: 1 .  Ibuprofen 200 MG CAPS, Take by mouth. , Disp: , Rfl:  .  lisinopril-hydrochlorothiazide (ZESTORETIC) 20-12.5 MG tablet, Take 1 tablet by mouth daily., Disp: 90 tablet, Rfl: 0 .  loratadine (CLARITIN) 10 MG tablet, Take 10 mg by mouth daily as needed. , Disp: , Rfl:  .  OZEMPIC, 0.25 OR 0.5 MG/DOSE, 2 MG/1.5ML SOPN, Inject 0.5 mg into the skin once a week., Disp: 1.5 mL, Rfl: 2 .  rosuvastatin (CRESTOR) 20 MG tablet, Take 1 tablet (20 mg total) by mouth daily., Disp: 90 tablet, Rfl: 0 .  oxyCODONE (ROXICODONE) 5 MG immediate release tablet, Take 1 tablet (5 mg total) by mouth daily  as needed for severe pain., Disp: 15 tablet, Rfl: 0  Review of Systems  Constitutional: Negative for chills and fever.  HENT: Positive for congestion, postnasal drip and sore throat. Negative for ear pain.   Respiratory: Positive for cough and wheezing.   Cardiovascular: Negative for chest pain, palpitations and leg swelling.  Gastrointestinal: Negative for abdominal pain and diarrhea.    Social History   Tobacco Use  . Smoking status: Current Every Day Smoker    Packs/day: 1.50    Years: 44.50    Pack years: 66.75    Types: Cigarettes  . Smokeless tobacco: Never Used  Substance Use Topics   . Alcohol use: Yes    Comment: 1 drink every 3-4 months      Objective:   There were no vitals taken for this visit. There were no vitals filed for this visit.There is no height or weight on file to calculate BMI.   Physical Exam Vitals reviewed.  Constitutional:      General: She is not in acute distress. Pulmonary:     Effort: No respiratory distress.  Neurological:     Mental Status: She is alert.      No results found for any visits on 12/04/19.     Assessment & Plan     1. Exposure to COVID-19 virus Exposed on Saturday, 11/29/19. Daughter tested positive yesterday but did not start showing symptoms until last night. Patient currently has no symptoms apart from baseline. Advised she would need to schedule testing 5 days after her daughter's positive test, which would be for Monday 12/08/19. She agrees. She was also advised to go to the UC or ER if symptoms develop and become severe over the holiday weekend. She voices understanding.    I discussed the assessment and treatment plan with the patient. The patient was provided an opportunity to ask questions and all were answered. The patient agreed with the plan and demonstrated an understanding of the instructions.   The patient was advised to call back or seek an in-person evaluation if the symptoms worsen or if the condition fails to improve as anticipated.  I provided 14 minutes of non-face-to-face time during this encounter.      Mar Daring, PA-C  [facility]

## 2019-12-04 NOTE — Telephone Encounter (Addendum)
Pt. Reports she had a direct COVID 19 exposure from her daughter. States she is concerned because of her medical history. Has some sinus drainage and a cough that she usually has related to her asthma. Requests a virtual visit. Spoke with Truddie Crumble in the practice. OK to schedule this morning at 10:40 with Ms. Marlyn Corporal.  Answer Assessment - Initial Assessment Questions 1. COVID-19 CLOSE CONTACT: "Who is the person with the confirmed or suspected COVID-19 infection that you were exposed to?"     Daughter 2. PLACE of CONTACT: "Where were you when you were exposed to COVID-19?" (e.g., home, school, medical waiting room; which city?)     Home 3. TYPE of CONTACT: "How much contact was there?" (e.g., sitting next to, live in same house, work in same office, same building)     Her daughter  4. DURATION of CONTACT: "How long were you in contact with the COVID-19 patient?" (e.g., a few seconds, passed by person, a few minutes, 15 minutes or longer, live with the patient)     11/29/19  Several hours 5. MASK: "Were you wearing a mask?" "Was the other person wearing a mask?" Note: wearing a mask reduces the risk of an  otherwise close contact.     No 6. DATE of CONTACT: "When did you have contact with a COVID-19 patient?" (e.g., how many days ago)     11/29/19 7. COMMUNITY SPREAD: "Are there lots of cases of COVID-19 (community spread) where you live?" (See public health department website, if unsure)       Yes 8. SYMPTOMS: "Do you have any symptoms?" (e.g., fever, cough, breathing difficulty, loss of taste or smell)     Some sinus drainage 9. PREGNANCY OR POSTPARTUM: "Is there any chance you are pregnant?" "When was your last menstrual period?" "Did you deliver in the last 2 weeks?"     No 10. HIGH RISK: "Do you have any heart or lung problems? Do you have a weak immune system?" (e.g., heart failure, COPD, asthma, HIV positive, chemotherapy, renal failure, diabetes mellitus, sickle cell anemia, obesity)    Asthma, COPD, heart disease 11.  TRAVEL: "Have you traveled out of the country recently?" If so, "When and where?"  Also ask about out-of-state travel, since the CDC has identified some high-risk cities for community spread in the [place].  Note: Travel becomes less relevant if there is widespread community transmission where the patient lives.       No  Protocols used: CORONAVIRUS (COVID-19) EXPOSURE-A-AH

## 2019-12-08 ENCOUNTER — Telehealth: Payer: Self-pay

## 2019-12-08 ENCOUNTER — Ambulatory Visit: Payer: Medicare Other | Attending: Internal Medicine

## 2019-12-08 DIAGNOSIS — Z20822 Contact with and (suspected) exposure to covid-19: Secondary | ICD-10-CM

## 2019-12-08 NOTE — Telephone Encounter (Signed)
Please review message and advise

## 2019-12-08 NOTE — Telephone Encounter (Signed)
Copied from [facility] [id number]. Topic: General - Other >> Dec 08, 2019 10:42 AM Celene Kras wrote: Reason for CRM: Pt called stating she came in contact with someone who tested positive for covid and she had to go and be tested. Pt states she is needing to have her b12 shot weekly and is requesting advice on how she should handle this. Please advise.

## 2019-12-08 NOTE — Telephone Encounter (Signed)
Patient advised and verbally voiced understanding.  

## 2019-12-08 NOTE — Telephone Encounter (Signed)
She needs to quarantine for 10 days after exposure, even if test negative. Her B-12 shot can wait until after the 10 day quarantine.

## 2019-12-09 ENCOUNTER — Ambulatory Visit: Payer: Self-pay

## 2019-12-10 ENCOUNTER — Telehealth: Payer: Self-pay | Admitting: *Deleted

## 2019-12-10 LAB — NOVEL CORONAVIRUS, NAA: SARS-CoV-2, NAA: NOT DETECTED

## 2019-12-10 NOTE — Telephone Encounter (Signed)
Patient called ,results still pending.

## 2019-12-11 ENCOUNTER — Telehealth: Payer: Self-pay

## 2019-12-11 NOTE — Telephone Encounter (Signed)
Caller given negative result and verbalized understanding  

## 2019-12-17 ENCOUNTER — Ambulatory Visit: Payer: Federal, State, Local not specified - PPO | Admitting: Physical Therapy

## 2019-12-22 ENCOUNTER — Ambulatory Visit: Payer: Federal, State, Local not specified - PPO | Admitting: Physical Therapy

## 2019-12-22 NOTE — Progress Notes (Deleted)
Patient: Laura Nielsen Female    DOB: 1956-03-29   64 y.o.   MRN: [id number] Visit Date: 12/22/2019  Today's Provider: Lelon Huh, MD   No chief complaint on file.  Subjective:     HPI  Diabetes Mellitus Type II, Follow-up:   Lab Results  Component Value Date   HGBA1C 6.1 (A) 06/10/2019   HGBA1C 6.0 (H) 02/12/2019   HGBA1C 6.2 (A) 10/04/2018   Last seen for diabetes 6 months ago.  Management since then includes Medication. She reports {excellent/good/fair/poor:19665} compliance with treatment. She {ACTION; IS/IS GUY:40347425} having side effects. *** Current symptoms include {Symptoms; diabetes:14075} and have been {Desc; course:15616}. Home blood sugar records: {diabetes glucometry results:16657}  Episodes of hypoglycemia? {yes***/no:17258}   Current Insulin Regimen: *** Most Recent Eye Exam: *** Weight trend: {trend:16658} Prior visit with dietician: {yes/no:17258} Current diet: {diet habits:16563} Current exercise: {exercise types:16438}  ------------------------------------------------------------------------   Allergies  Allergen Reactions  . Atorvastatin Other (See Comments)    Elevated blood sugar Elevated blood sugar  . Omeprazole Nausea And Vomiting  . Bupropion Nausea Only     Current Outpatient Medications:  .  albuterol (PROVENTIL HFA) 108 (90 Base) MCG/ACT inhaler, Inhale 2 puffs into the lungs every 6 (six) hours as needed for wheezing or shortness of breath., Disp: 18 g, Rfl: 11 .  allopurinol (ZYLOPRIM) 100 MG tablet, Take 1 tablet (100 mg total) by mouth daily., Disp: 90 tablet, Rfl: 4 .  aspirin 81 MG tablet, Take 81 mg by mouth daily. , Disp: , Rfl:  .  bisoprolol (ZEBETA) 10 MG tablet, Take 1 tablet (10 mg total) by mouth daily., Disp: 30 tablet, Rfl: 5 .  clopidogrel (PLAVIX) 75 MG tablet, Take 1 tablet (75 mg total) by mouth daily., Disp: 90 tablet, Rfl: 2 .  cyanocobalamin (,VITAMIN B-12,) 1000 MCG/ML injection, Inject 61ml IM  once a week for four weeks then once a month for four months., Disp: , Rfl:  .  cyanocobalamin 1000 MCG tablet, Take 1,000 mcg by mouth daily., Disp: , Rfl:  .  ergocalciferol (VITAMIN D2) 1.25 MG (50000 UT) capsule, Take by mouth., Disp: , Rfl:  .  gabapentin (NEURONTIN) 300 MG capsule, Take 1 capsule (300 mg total) by mouth 3 (three) times daily. Take 1 capsule (300 mg total) by mouth 3 (three) times daily., Disp: 270 capsule, Rfl: 1 .  Ibuprofen 200 MG CAPS, Take by mouth. , Disp: , Rfl:  .  lisinopril-hydrochlorothiazide (ZESTORETIC) 20-12.5 MG tablet, Take 1 tablet by mouth daily., Disp: 90 tablet, Rfl: 0 .  loratadine (CLARITIN) 10 MG tablet, Take 10 mg by mouth daily as needed. , Disp: , Rfl:  .  oxyCODONE (ROXICODONE) 5 MG immediate release tablet, Take 1 tablet (5 mg total) by mouth daily as needed for severe pain., Disp: 15 tablet, Rfl: 0 .  OZEMPIC, 0.25 OR 0.5 MG/DOSE, 2 MG/1.5ML SOPN, Inject 0.5 mg into the skin once a week., Disp: 1.5 mL, Rfl: 2 .  rosuvastatin (CRESTOR) 20 MG tablet, Take 1 tablet (20 mg total) by mouth daily., Disp: 90 tablet, Rfl: 0  Review of Systems  Social History   Tobacco Use  . Smoking status: Current Every Day Smoker    Packs/day: 1.50    Years: 44.50    Pack years: 66.75    Types: Cigarettes  . Smokeless tobacco: Never Used  Substance Use Topics  . Alcohol use: Yes    Comment: 1 drink every 3-4 months  Objective:   There were no vitals taken for this visit. There were no vitals filed for this visit.There is no height or weight on file to calculate BMI.   Physical Exam   No results found for any visits on 12/23/19.     Assessment & Plan        Lelon Huh, MD  [facility]

## 2019-12-23 ENCOUNTER — Encounter: Payer: Self-pay | Admitting: Pain Medicine

## 2019-12-23 ENCOUNTER — Ambulatory Visit: Payer: Medicare Other | Admitting: Family Medicine

## 2019-12-23 NOTE — Progress Notes (Signed)
Patient: Laura Nielsen  Service Category: E/M  Provider: Gaspar Cola, MD  DOB: 02-27-1956  DOS: 12/24/2019  Location: Office  MRN: [id number]  Setting: Ambulatory outpatient  Referring Provider: Birdie Sons, MD  Type: Established Patient  Specialty: Interventional Pain Management  PCP: Birdie Sons, MD  Location: Remote location  Delivery: TeleHealth     Virtual Encounter - Pain Management PROVIDER NOTE: Information contained herein reflects review and annotations entered in association with encounter. Interpretation of such information and data should be left to medically-trained personnel. Information provided to patient can be located elsewhere in the medical record under "Patient Instructions". Document created using STT-dictation technology, any transcriptional errors that may result from process are unintentional.    Contact & Pharmacy Preferred: [phone] Home: [phone] (home) Mobile: [phone] (mobile) E-mail: [email or web address] [address] [place] [address] Phone: [phone] Fax: [phone]   Pre-screening  Laura Nielsen offered "in-person" vs "virtual" encounter. She indicated preferring virtual for this encounter.   Reason COVID-19*  Social distancing based on CDC and AMA recommendations.   I contacted Laura Nielsen on 12/24/2019 via telephone.      I clearly identified myself as Gaspar Cola, MD. I verified that I was speaking with the correct person using two identifiers (Name: Laura Nielsen, and date of birth: 11/14/1956).  Consent I sought verbal advanced consent from Laura Nielsen for virtual visit interactions. I informed Laura Nielsen of possible security and privacy concerns, risks, and limitations associated with providing "not-in-person" medical evaluation and management services. I also informed Laura Nielsen of the availability of "in-person" appointments. Finally, I informed her that there  would be a charge for the virtual visit and that she could be  personally, fully or partially, financially responsible for it. [name] expressed understanding and agreed to proceed.   Historic Elements   Laura Nielsen is a 64 y.o. year old, female patient evaluated today after her last encounter by our practice on 07/09/2019. Laura Nielsen  has a past medical history of Allergy, Arthritis, Asthma, Depression, Diabetes mellitus without complication ([place]), Emphysema of lung ([place]), GERD (gastroesophageal reflux disease), Hyperlipidemia, Hypertension, Osteoporosis, and Tobacco abuse counseling. She also  has a past surgical history that includes vascular stent (03/28/2011); Cardiac catheterization (3/210); Kidney stone surgery (1999); Vaginal hysterectomy; and Tubal ligation. Laura Nielsen has a current medication list which includes the following prescription(s): albuterol, allopurinol, aspirin, bisoprolol, clopidogrel, cyanocobalamin, cyanocobalamin, ergocalciferol, [START ON 01/05/2020] gabapentin, ibuprofen, lisinopril-hydrochlorothiazide, loratadine, oxycodone, ozempic (0.25 or 0.5 mg/dose), and rosuvastatin. She  reports that she has been smoking cigarettes. She has a 66.75 pack-year smoking history. She has never used smokeless tobacco. She reports current alcohol use. She reports that she does not use drugs. Laura Nielsen is allergic to atorvastatin; omeprazole; and bupropion.   HPI  Today, she is being contacted for medication management.  The patient indicates doing well with the current medication regimen. No adverse reactions or side effects reported to the medications.  The patient refers that she has been having some problems with staggering and also with finding words.  She recently had an MRI of the brain that essentially shows some small vessel disease.  She indicated that she is scheduled to have some nerve conduction test of the upper and lower extremities tomorrow.  Today I went over the brain MRI with her  and I gave her my interpretation of it, but I reminded her  that this is not my specialty and that she should request more information from the neurologist that she is scheduled to see soon.  Pharmacotherapy Assessment  Analgesic: Oxycodone IR 5 mg, 1 tab PO QD PRN (<5 mg/day of oxycodone) MME: <7.5 mg/day.   Monitoring: Pharmacotherapy: No side-effects or adverse reactions reported. [place] PMP: PDMP reviewed during this encounter.       Compliance: No problems identified. Effectiveness: Clinically acceptable. Plan: Refer to "POC".  UDS:  Summary  Date Value Ref Range Status  03/08/2017 FINAL  Final    Comment:    ==================================================================== TOXASSURE COMP DRUG ANALYSIS,UR ==================================================================== Test                             Result       Flag       Units Drug Present and Declared for Prescription Verification   Gabapentin                     PRESENT      EXPECTED   Amitriptyline                  PRESENT      EXPECTED   Nortriptyline                  PRESENT      EXPECTED    Nortriptyline is an expected metabolite of amitriptyline.   Salicylate                     PRESENT      EXPECTED Drug Absent but Declared for Prescription Verification   Oxycodone                      Not Detected UNEXPECTED ng/mg creat   Bupropion                      Not Detected UNEXPECTED   Acetaminophen                  Not Detected UNEXPECTED    Acetaminophen, as indicated in the declared medication list, is    not always detected even when used as directed.   Ibuprofen                      Not Detected UNEXPECTED    Ibuprofen, as indicated in the declared medication list, is not    always detected even when used as directed. ==================================================================== Test                      Result    Flag   Units      Ref Range   Creatinine              160              mg/dL       >=20 ==================================================================== Declared Medications:  The flagging and interpretation on this report are based on the  following declared medications.  Unexpected results may arise from  inaccuracies in the declared medications.  **Note: The testing scope of this panel includes these medications:  Amitriptyline (Elavil)  Bupropion (Wellbutrin)  Gabapentin  Oxycodone (Oxycodone Acetaminophen)  **Note: The testing scope of this panel does not include small to  moderate amounts of these reported medications:  Acetaminophen (Oxycodone Acetaminophen)  Aspirin  Ibuprofen  **Note:  The testing scope of this panel does not include following  reported medications:  Albuterol  Allopurinol (Zyloprim)  Bisoprolol (Zebeta)  Clopidogrel (Plavix)  Cyanocobalamin  Lisinopril  Loratadine (Claritin)  Lovastatin (Mevacor)  Triamcinolone (Kenalog)  Vitamin C  Vitamin D ==================================================================== For clinical consultation, please call [phone]. ====================================================================    Laboratory Chemistry Profile (12 mo)  Renal: 02/12/2019: BUN 16; BUN/Creatinine Ratio 25 11/14/2019: Creatinine, Ser 0.60  Lab Results  Component Value Date   GFRAA 111 02/12/2019   GFRNONAA 96 02/12/2019   Hepatic: 02/12/2019: Albumin 4.0 Lab Results  Component Value Date   AST 21 02/12/2019   ALT 19 02/12/2019   Other: 02/12/2019: Vit D, 25-Hydroxy 25.2 11/03/2019: Vitamin B-12 198 Note: Above Lab results reviewed.  Imaging  MR BRAIN W WO CONTRAST CLINICAL DATA:  Gait disturbance.  Tremors.  Forgetfulness.  EXAM: MRI HEAD WITHOUT AND WITH CONTRAST  TECHNIQUE: Multiplanar, multiecho pulse sequences of the brain and surrounding structures were obtained without and with intravenous contrast.  CONTRAST:  36mL GADAVIST GADOBUTROL 1 MMOL/ML IV SOLN  COMPARISON:  Head CT  10/04/2012  FINDINGS: Brain: Mild age related volume loss. Diffusion imaging does not show any acute or subacute infarction. The brainstem and cerebellum appear normal. Midbrain appears normal. Cerebral hemispheres show scattered foci of T2 and FLAIR signal within the hemispheric white matter, slightly advanced for age in possibly representing an early manifestation of small vessel change. No cortical or large vessel territory infarction. No mass lesion, hemorrhage, hydrocephalus or extra-axial collection. After contrast administration, no abnormal enhancement occurs.  Vascular: Major vessels at the base of the brain show flow.  Skull and upper cervical spine: Negative  Sinuses/Orbits: Clear/normal  Other: None  IMPRESSION: No acute or reversible finding. Few scattered foci of T2 and FLAIR signal within the hemispheric white matter probably representing an early manifestation of small vessel change, slightly advanced for age.  Electronically Signed   By: Nelson Chimes M.D.   On: 11/14/2019 15:17   Assessment  The primary encounter diagnosis was Chronic pain syndrome. Diagnoses of Chronic low back pain (Primary Area of Pain) (Bilateral) (L>R), Chronic neck pain (Secondary area of Pain) (Bilateral) (R>L), Chronic upper back pain (Third area of Pain) (Bilateral) (R>L), and Neurogenic pain were also pertinent to this visit.  Plan of Care  Problem-specific:  No problem-specific Assessment & Plan notes found for this encounter.  I am having Laura Nielsen maintain her aspirin, Ibuprofen, loratadine, albuterol, bisoprolol, clopidogrel, allopurinol, lisinopril-hydrochlorothiazide, Ozempic (0.25 or 0.5 MG/DOSE), rosuvastatin, cyanocobalamin, cyanocobalamin, ergocalciferol, oxyCODONE, and gabapentin.  Pharmacotherapy (Medications Ordered): Meds ordered this encounter  Medications  . oxyCODONE (ROXICODONE) 5 MG immediate release tablet    Sig: Take 1 tablet (5 mg total) by mouth daily  as needed for severe pain.    Dispense:  15 tablet    Refill:  0    Chronic Pain: STOP Act (Not applicable) Fill 1 day early if closed on refill date. Do not fill until: 12/24/2019. To last until: 01/23/2020. Avoid benzodiazepines within 8 hours of opioids  . gabapentin (NEURONTIN) 300 MG capsule    Sig: Take 1 capsule (300 mg total) by mouth 3 (three) times daily. Take 1 capsule (300 mg total) by mouth 3 (three) times daily.    Dispense:  270 capsule    Refill:  1    Fill one day early if pharmacy is closed on scheduled refill date. May substitute for generic if available.   Orders:  No orders of the defined  types were placed in this encounter.  Follow-up plan:   No follow-ups on file.      Interventional management options: Planned, scheduled, and/or pending:   Not at this time.   Considering:   Diagnostic bilateral lumbar facetblock #2  Possible bilateral lumbar facet RFA Diagnostic right CESI  Diagnostic bilateral cervical facetblock  Possible bilateral cervical facet RFA Diagnostic IA hip joint injection  Diagnostic bilateral femoral and obturator NB  Possible bilateral femoral and obturator nerve RFA Diagnostic right occipital NB Possible right occipital nerve RFA Possible right occipital peripheral nerve stimulator trial   Palliative PRN treatment(s):   Diagnostic/therapeutic bilateral lumbar facet block #2     Recent Visits No visits were found meeting these conditions.  Showing recent visits within past 90 days and meeting all other requirements   Today's Visits Date Type Provider Dept  12/24/19 Telemedicine Milinda Pointer, MD [facility]  Showing today's visits and meeting all other requirements   Future Appointments No visits were found meeting these conditions.  Showing future appointments within next 90 days and meeting all other requirements   I discussed the assessment and treatment plan with the patient. The patient was provided an  opportunity to ask questions and all were answered. The patient agreed with the plan and demonstrated an understanding of the instructions.  Patient advised to call back or seek an in-person evaluation if the symptoms or condition worsens.  Duration of encounter: 15 minutes.  Note by: Gaspar Cola, MD Date: 12/24/2019; Time: 11:45 AM

## 2019-12-23 NOTE — Progress Notes (Signed)
Patient states that she is currently going to a Neurologist for balance issues. She has had an MRI of her brain and will be getting an EMG of her arms and legs.

## 2019-12-24 ENCOUNTER — Other Ambulatory Visit: Payer: Self-pay

## 2019-12-24 ENCOUNTER — Ambulatory Visit: Payer: Medicare Other | Attending: Pain Medicine | Admitting: Pain Medicine

## 2019-12-24 DIAGNOSIS — M546 Pain in thoracic spine: Secondary | ICD-10-CM | POA: Diagnosis not present

## 2019-12-24 DIAGNOSIS — M545 Low back pain, unspecified: Secondary | ICD-10-CM

## 2019-12-24 DIAGNOSIS — G894 Chronic pain syndrome: Secondary | ICD-10-CM | POA: Diagnosis not present

## 2019-12-24 DIAGNOSIS — M542 Cervicalgia: Secondary | ICD-10-CM

## 2019-12-24 DIAGNOSIS — G8929 Other chronic pain: Secondary | ICD-10-CM

## 2019-12-24 DIAGNOSIS — M792 Neuralgia and neuritis, unspecified: Secondary | ICD-10-CM | POA: Diagnosis not present

## 2019-12-24 MED ORDER — GABAPENTIN 300 MG PO CAPS: 300.0000 mg | ORAL_CAPSULE | Freq: Three times a day (TID) | ORAL | 1 refills | Status: DC

## 2019-12-24 MED ORDER — OXYCODONE HCL 5 MG PO TABS: 5.0000 mg | ORAL_TABLET | Freq: Every day | ORAL | 0 refills | Status: DC | PRN

## 2019-12-25 ENCOUNTER — Ambulatory Visit: Payer: Federal, State, Local not specified - PPO

## 2019-12-25 DIAGNOSIS — R200 Anesthesia of skin: Secondary | ICD-10-CM | POA: Diagnosis not present

## 2019-12-25 DIAGNOSIS — R202 Paresthesia of skin: Secondary | ICD-10-CM | POA: Diagnosis not present

## 2019-12-26 DIAGNOSIS — R200 Anesthesia of skin: Secondary | ICD-10-CM | POA: Diagnosis not present

## 2019-12-26 DIAGNOSIS — R202 Paresthesia of skin: Secondary | ICD-10-CM | POA: Diagnosis not present

## 2019-12-29 ENCOUNTER — Ambulatory Visit: Payer: Federal, State, Local not specified - PPO | Admitting: Physical Therapy

## 2019-12-31 ENCOUNTER — Ambulatory Visit: Payer: Federal, State, Local not specified - PPO | Admitting: Physical Therapy

## 2020-01-03 ENCOUNTER — Other Ambulatory Visit: Payer: Self-pay | Admitting: Family Medicine

## 2020-01-03 DIAGNOSIS — E7800 Pure hypercholesterolemia, unspecified: Secondary | ICD-10-CM

## 2020-01-05 ENCOUNTER — Ambulatory Visit: Payer: Federal, State, Local not specified - PPO | Admitting: Physical Therapy

## 2020-01-07 ENCOUNTER — Other Ambulatory Visit: Payer: Self-pay | Admitting: Family Medicine

## 2020-01-07 ENCOUNTER — Ambulatory Visit: Payer: Federal, State, Local not specified - PPO | Admitting: Physical Therapy

## 2020-01-07 DIAGNOSIS — I10 Essential (primary) hypertension: Secondary | ICD-10-CM

## 2020-01-12 ENCOUNTER — Ambulatory Visit: Payer: Federal, State, Local not specified - PPO | Admitting: Physical Therapy

## 2020-01-14 ENCOUNTER — Ambulatory Visit: Payer: Federal, State, Local not specified - PPO | Admitting: Physical Therapy

## 2020-01-15 ENCOUNTER — Encounter: Payer: Self-pay | Admitting: Physical Therapy

## 2020-01-15 ENCOUNTER — Other Ambulatory Visit: Payer: Self-pay

## 2020-01-15 ENCOUNTER — Ambulatory Visit: Payer: Medicare Other | Attending: Neurology | Admitting: Physical Therapy

## 2020-01-15 DIAGNOSIS — M6281 Muscle weakness (generalized): Secondary | ICD-10-CM | POA: Diagnosis not present

## 2020-01-15 DIAGNOSIS — R2689 Other abnormalities of gait and mobility: Secondary | ICD-10-CM | POA: Diagnosis not present

## 2020-01-15 DIAGNOSIS — R262 Difficulty in walking, not elsewhere classified: Secondary | ICD-10-CM | POA: Diagnosis not present

## 2020-01-15 NOTE — Patient Instructions (Signed)
Hip Abduction: Side-Lying (Single Leg)   HIP: Abduction - Side-Lying    Lie on side, legs straight and in line with trunk. Squeeze glutes. Raise top leg up and slightly back. Point toes forward. __15_ reps per set, _2__ sets per day, 7___ days per week Bend bottom leg to stabilize pelvis.  Copyright  VHI. All rights reserved.  .  http://tub.exer.us/210   Copyright  VHI. All rights reserved.  Heel Raises    Stand with support. Tighten pelvic floor and hold. With knees straight, raise heels off ground. Hold ___2 seconds. Repeat _15__ times. Do _2__ times a day.  Copyright  VHI. All rights reserved.

## 2020-01-15 NOTE — Therapy (Signed)
[place] [facility] SERVICES [address] [place], [place], [address] Phone: [phone]   Fax:  [phone]  Physical Therapy Evaluation  Patient Details  Name: Laura Nielsen MRN: [id number] Date of Birth: [birth year] Referring Provider (PT): shah Hemang   Encounter Date: 01/15/2020  PT End of Session - 01/15/20 1311    Visit Number  1    Number of Visits  17    Date for PT Re-Evaluation  03/11/20    PT Start Time  0100    PT Stop Time  0200    PT Time Calculation (min)  60 min    Equipment Utilized During Treatment  Gait belt    Activity Tolerance  Patient tolerated treatment well       Past Medical History:  Diagnosis Date  . Allergy   . Arthritis   . Asthma   . Depression   . Diabetes mellitus without complication ([place])   . Emphysema of lung ([place])   . GERD (gastroesophageal reflux disease)   . Hyperlipidemia   . Hypertension   . Osteoporosis   . Tobacco abuse counseling     Past Surgical History:  Procedure Laterality Date  . Cardiac catheterization  3/210   70-80% stenosis RCA stent placed. started on Plavix  . [place]  . TUBAL LIGATION    . VAGINAL HYSTERECTOMY     Menometrorrhagia. Excessive bleeding. Unknown if cervix removed.   . vascular stent  03/28/2011   Dr. Delana Meyer, [facility]; Infrarenal    There were no vitals filed for this visit.   Subjective Assessment - 01/15/20 1313    Subjective  Patient has weakness in BLE and has stagering gait. She is also having speech difficulty.    Pertinent History  Patient began having symprtoms of weakness, and poor balance and difficulty finding words 5 years ago. She walks without any device. She has had 1 fall and landed on the bed 1 year ago.    Limitations  Standing;Walking    How long can you sit comfortably?  unlimited    How long can you stand comfortably?  5 mins    How long can you walk comfortably?  40 feet    Patient Stated Goals  to walk better     Currently in Pain?  Yes    Pain Score  5     Pain Location  Back    Pain Orientation  Lower    Pain Descriptors / Indicators  Aching    Pain Type  Chronic pain    Pain Onset  More than a month ago    Pain Frequency  Constant    Aggravating Factors   standing    Pain Relieving Factors  sitting    Effect of Pain on Daily Activities  difficult to walk and move    Multiple Pain Sites  No         [facility] PT Assessment - 01/15/20 1307      Assessment   Medical Diagnosis  imbalance    Referring Provider (PT)  shah Hemang    Onset Date/Surgical Date  11/07/19    Hand Dominance  Right      Precautions   Precautions  Fall      Restrictions   Weight Bearing Restrictions  No      Balance Screen   Has the patient fallen in the past 6 months  No    Has the patient had a decrease  in activity level because of a fear of falling?   Yes    Is the patient reluctant to leave their home because of a fear of falling?   Yes      [place] residence    Living Arrangements  Alone    Available Help at Discharge  Friend(s)    Type of [place] to enter    Entrance Stairs-Number of Steps  3   3   Entrance Stairs-Rails  Right    Home Layout  One level      Prior Function   Level of [place] with basic ADLs    Vocation  On disability    Leisure  gambling      Cognition   Overall Cognitive Status  Within Functional Limits for tasks assessed      Standardized Balance Assessment   Standardized Balance Assessment  Berg Balance Test      Berg Balance Test   Sit to Stand  Able to stand without using hands and stabilize independently    Standing Unsupported  Able to stand safely 2 minutes    Sitting with Back Unsupported but Feet Supported on Floor or Stool  Able to sit safely and securely 2 minutes    Stand to Sit  Sits safely with minimal use of hands    Transfers  Able to transfer safely, minor use of hands     Standing Unsupported with Eyes Closed  Able to stand 10 seconds with supervision    Standing Unsupported with Feet Together  Able to place feet together independently and stand 1 minute safely    From Standing, Reach Forward with Outstretched Arm  Can reach forward >5 cm safely (2")    From Standing Position, Pick up Object from Floor  Able to pick up shoe, needs supervision    From Standing Position, Turn to Look Behind Over each Shoulder  Turn sideways only but maintains balance    Turn 360 Degrees  Able to turn 360 degrees safely one side only in 4 seconds or less    Standing Unsupported, Alternately Place Feet on Step/Stool  Able to stand independently and complete 8 steps >20 seconds    Standing Unsupported, One Foot in Front  Needs help to step but can hold 15 seconds    Standing on One Leg  Able to lift leg independently and hold > 10 seconds    Total Score  45        POSTURE: WNL   PROM/AROM: WFL  STRENGTH:  Graded on a 0-5 scale Muscle Group Left Right                          Hip Flex -4/5 -4/5  Hip Abd -3/5 -3/5  Hip Add 2/5 2/5  Hip Ext NT NT      Knee Flex 5/5 5/5  Knee Ext 5/5 5/5  Ankle DF 4/5 4/5  Ankle PF 1/5 3/5   SENSATION: reports WNL light and deep touch   FUNCTIONAL MOBILITY: needs increased time for rolling supine to sidelying   BALANCE: Static Standing Balance  Normal Able to maintain standing balance against maximal resistance   Good Able to maintain standing balance against moderate resistance   Good-/Fair+ Able to maintain standing balance against minimal resistance x  Fair Able to stand unsupported without UE support and without LOB  for 1-2 min   Fair- Requires Min A and UE support to maintain standing without loss of balance   Poor+ Requires mod A and UE support to maintain standing without loss of balance   Poor Requires max A and UE support to maintain standing balance without loss    Standing Dynamic Balance  Normal Stand  independently unsupported, able to weight shift and cross midline maximally   Good Stand independently unsupported, able to weight shift and cross midline moderately   Good-/Fair+ Stand independently unsupported, able to weight shift across midline minimally x  Fair Stand independently unsupported, weight shift, and reach ipsilaterally, loss of balance when crossing midline   Poor+ Able to stand with Min A and reach ipsilaterally, unable to weight shift   Poor Able to stand with Mod A and minimally reach ipsilaterally, unable to cross midline.       GAIT: Patient ambulates intermediate distances with decreased gait speed and path deviation with reports of back pain  OUTCOME MEASURES: TEST Outcome Interpretation  5 times sit<>stand 23.94sec >22 yo, >15 sec indicates increased risk for falls  10 meter walk test     . 68            m/s <1.0 m/s indicates increased risk for falls; limited community ambulator  Timed up and Go     14.50            sec <14 sec indicates increased risk for falls  6 minute walk test  450              Feet 1000 feet is community Conservator, museum/gallery Assessment/56 45/56 <36/56 (100% risk for falls), 37-45 (80% risk for falls); 46-51 (>50% risk for falls); 52-55 (lower risk <25% of falls)        Treatment: Heel raises double with 2 second hold x 15 x 2 sets sidelying hip abd x 15 - cues for correct position Pt educated throughout session about proper posture and technique with exercises. Improved exercise technique, movement at target joints, use of target muscles after min to mod verbal, visual, tactile cues.         Objective measurements completed on examination: See above findings.    photo score; Patient's Physical FS Primary Measure 45 Patient's intake functional measure is 45 out of 100 (higher number = greater function). Risk Adjusted Statistical FOTO* 42           PT Education - 01/15/20 1309    Education Details  plan of care     Person(s) Educated  Patient    Methods  Explanation    Comprehension  Verbalized understanding       PT Short Term Goals - 01/15/20 1428      PT SHORT TERM GOAL #1   Title  Patient will be independent in home exercise program to improve strength/mobility for better functional independence with ADLs.    Time  4    Period  Weeks    Status  New    Target Date  02/12/20      PT SHORT TERM GOAL #2   Title  Patient (> 63 years old) will complete five times sit to stand test in < 15 seconds indicating an increased LE strength and improved balance.    Time  4    Period  Weeks    Status  New    Target Date  02/12/20        PT Long Term Goals -  01/15/20 1429      PT LONG TERM GOAL #1   Title  Patient will increase Berg Balance score by > 6 points to demonstrate decreased fall risk during functional activities    Time  8    Period  Weeks    Status  New    Target Date  03/11/20      PT LONG TERM GOAL #2   Title  Patient will increase six minute walk test distance to >1000 for progression to community ambulator and improve gait ability    Time  8    Period  Weeks    Status  New    Target Date  03/11/20      PT LONG TERM GOAL #3   Title  Patient will increase 10 meter walk test to >1.76m/s as to improve gait speed for better community ambulation and to reduce fall risk.    Time  8    Period  Weeks    Status  New    Target Date  03/11/20             Plan - 01/15/20 1321    Clinical Impression Statement PT examination reveals grossly weak BLE ankels with heavy UE reliance for sit to stand transfers. Patient has significantly impaired balance, decreased LE power noted with 5 x sit to stand, and TUG  with primary difficulty with performing sit to stand phase of test. Gait speed is also below age/gender normal with path deviation in gait. Pt will benefit from skilled PT services to address deficits in balance and strength in order to improve function and decrease fall risk.      Personal Factors and Comorbidities  Comorbidity 1;Comorbidity 2    Comorbidities  diabetes,CHF    Examination-Activity Limitations  Bathing;Locomotion Level;Stand;Bed Mobility    Examination-Participation Restrictions  Cleaning;Community Activity    Stability/Clinical Decision Making  Stable/Uncomplicated    Clinical Decision Making  Low    Rehab Potential  Fair    PT Frequency  2x / week    PT Duration  8 weeks    PT Treatment/Interventions  Therapeutic activities;Therapeutic exercise;Balance training;Neuromuscular re-education;Patient/family education;Gait training;Moist Heat;Electrical Stimulation;Manual techniques    PT Next Visit Plan  balance and strengthening    Consulted and Agree with Plan of Care  Patient       Patient will benefit from skilled therapeutic intervention in order to improve the following deficits and impairments:  Abnormal gait, Decreased endurance, Decreased range of motion, Decreased strength, Pain, Impaired sensation, Dizziness, Decreased mobility, Difficulty walking, Obesity  Visit Diagnosis: Muscle weakness (generalized)  Difficulty in walking, not elsewhere classified  Other abnormalities of gait and mobility     Problem List Patient Active Problem List   Diagnosis Date Noted  . Aortic atherosclerosis ([place]) 07/17/2019  . Tobacco abuse 01/10/2019  . Unspecified inflammatory spondylopathy, lumbar region ([place]) 10/04/2018  . Abnormal MRI, lumbar spine (03/28/2017) 07/04/2017  . Abnormal MRI, cervical spine (03/28/2017) 07/04/2017  . Lumbar facet syndrome (Bilateral) (L>R) 04/26/2017  . Lumbar facet hypertrophy (multilevel) (Bilateral) 04/26/2017  . Long term current use of anticoagulant therapy 04/26/2017  . Neurogenic pain 04/26/2017  . Musculoskeletal pain 04/26/2017  . Chronic pain syndrome 03/08/2017  . Long term prescription opiate use 03/08/2017  . Opiate use 03/08/2017  . Chronic low back pain (Primary Area of Pain) (Bilateral) (L>R)  03/08/2017  . Chronic neck pain (Secondary area of Pain) (Bilateral) (R>L) 03/08/2017  . Cervicogenic headache (Right) 03/08/2017  . Chronic upper  back pain (Third area of Pain) (Bilateral) (R>L) 03/08/2017  . Chronic hip pain (Bilateral) (L>R) 03/08/2017  . Osteoarthritis of hip (Bilateral) (L>R) 03/08/2017  . Cervical central spinal stenosis 03/08/2017  . Cervical spondylosis with radiculopathy (Right) (C5) 03/08/2017  . Lumbar spondylosis 03/08/2017  . Atherosclerosis of native arteries of extremity with intermittent claudication ([place]) 02/22/2017  . Gout 02/16/2016  . Ankle pain 11/17/2015  . Arthritis 05/14/2015  . Carotid artery narrowing 05/14/2015  . Claudication ([place]) 05/14/2015  . CAFL (chronic airflow limitation) ([place]) 05/14/2015  . Clinical depression 05/14/2015  . Acid reflux 05/14/2015  . Urinary incontinence 05/14/2015  . Pins and needles sensation 05/14/2015  . Obstructive apnea 05/14/2015  . Morbid obesity ([place]) 05/14/2015  . History of abnormal cervical Papanicolaou smear 05/14/2015  . Peripheral blood vessel disorder ([place]) 05/14/2015  . B12 deficiency 05/14/2015  . Vitamin D insufficiency 05/14/2015  . AAA (abdominal aortic aneurysm) without rupture ([place]) 05/26/2014  . Aortic heart valve narrowing 01/31/2013  . Malaise and fatigue 09/26/2012  . CAD in native artery 03/17/2009  . Diabetes mellitus, type 2 ([place]) 02/05/2009  . Hypercholesteremia 06/24/2008  . Allergic rhinitis 10/25/2007  . Airway hyperreactivity 10/25/2007  . Smoking greater than 30 pack years 10/25/2007  . Narrowing of intervertebral disc space 10/25/2007  . Benign essential HTN 10/25/2007  . Arthritis, degenerative 10/25/2007    Alanson Puls, PT DPT 01/15/2020, 2:34 PM  [place] [facility] SERVICES [address] [place], [place], [address] Phone: [phone]   Fax:  [phone]  Name: Laura Nielsen MRN: [id number] Date of Birth:  09-21-1956

## 2020-01-16 ENCOUNTER — Other Ambulatory Visit: Payer: Self-pay | Admitting: Family Medicine

## 2020-01-16 DIAGNOSIS — Z1231 Encounter for screening mammogram for malignant neoplasm of breast: Secondary | ICD-10-CM

## 2020-01-19 ENCOUNTER — Ambulatory Visit: Payer: Medicare Other | Admitting: Physical Therapy

## 2020-01-21 ENCOUNTER — Ambulatory Visit: Payer: Medicare Other | Admitting: Physical Therapy

## 2020-01-22 ENCOUNTER — Ambulatory Visit: Payer: Medicare Other | Admitting: Physical Therapy

## 2020-01-26 ENCOUNTER — Ambulatory Visit: Payer: Federal, State, Local not specified - PPO | Admitting: Physical Therapy

## 2020-01-26 NOTE — Progress Notes (Signed)
Patient: Laura Nielsen Female    DOB: 08/26/1956   64 y.o.   MRN: [id number] Visit Date: 01/27/2020  Today's Provider: Lelon Huh, MD   Chief Complaint  Patient presents with  . Diabetes  . URI  Virtual Visit via Video Note  I connected with [name] on 01/27/20 at  9:40 AM EST by a video enabled telemedicine application and verified that I am speaking with the correct person using two identifiers.   I discussed the limitations of evaluation and management by telemedicine and the availability of in person appointments. The patient expressed understanding and agreed to proceed.     Subjective:      Diabetes Mellitus Type II, Follow-up:   Lab Results  Component Value Date   HGBA1C 6.1 (A) 06/10/2019   HGBA1C 6.0 (H) 02/12/2019   HGBA1C 6.2 (A) 10/04/2018   Last seen for diabetes 7 months ago.  Management since then includes no changes. She reports good compliance with treatment. She is not having side effects.  Current symptoms include none  Home blood sugar records: not being checked at home  Episodes of hypoglycemia? Unknown   Current insulin regiment: Ozempic Most Recent Eye Exam: 01/09/2018 Weight trend: stable Current exercise: PT Current diet habits: not 3 meals a day due to loss of appetite  Pertinent Labs:    Component Value Date/Time   CHOL 138 02/12/2019 1049   TRIG 157 (H) 02/12/2019 1049   HDL 34 (L) 02/12/2019 1049   LDLCALC 73 02/12/2019 1049   CREATININE 0.60 11/14/2019 1258   CREATININE 0.60 08/30/2017 0920    Wt Readings from Last 3 Encounters:  06/10/19 254 lb (115.2 kg)  05/05/19 255 lb (115.7 kg)  02/12/19 258 lb (117 kg)    ------------------------------------------------------------------------ Sinus Problem This is a new problem. Episode onset: 8 months ago  The problem has been waxing and waning since onset. There has been no fever. Associated symptoms include congestion, coughing and headaches. Treatments tried: Flonase  and antibiotics. The treatment provided mild relief.  Using Flonase. States symptoms improved a bit on antibiotic. right side sinuses.   Allergies  Allergen Reactions  . Atorvastatin Other (See Comments)    Elevated blood sugar Elevated blood sugar  . Omeprazole Nausea And Vomiting  . Bupropion Nausea Only     Current Outpatient Medications:  .  albuterol (PROVENTIL HFA) 108 (90 Base) MCG/ACT inhaler, Inhale 2 puffs into the lungs every 6 (six) hours as needed for wheezing or shortness of breath., Disp: 18 g, Rfl: 11 .  allopurinol (ZYLOPRIM) 100 MG tablet, Take 1 tablet (100 mg total) by mouth daily., Disp: 90 tablet, Rfl: 4 .  aspirin 81 MG tablet, Take 81 mg by mouth daily. , Disp: , Rfl:  .  bisoprolol (ZEBETA) 10 MG tablet, Take 1 tablet (10 mg total) by mouth daily., Disp: 30 tablet, Rfl: 0 .  clopidogrel (PLAVIX) 75 MG tablet, Take 1 tablet (75 mg total) by mouth daily., Disp: 90 tablet, Rfl: 2 .  cyanocobalamin (,VITAMIN B-12,) 1000 MCG/ML injection, Inject 57ml IM once a week for four weeks then once a month for four months., Disp: , Rfl:  .  cyanocobalamin 1000 MCG tablet, Take 1,000 mcg by mouth daily., Disp: , Rfl:  .  gabapentin (NEURONTIN) 300 MG capsule, Take 1 capsule (300 mg total) by mouth 3 (three) times daily. Take 1 capsule (300 mg total) by mouth 3 (three) times daily., Disp: 270 capsule, Rfl: 1 .  Ibuprofen 200 MG CAPS, Take by mouth. , Disp: , Rfl:  .  lisinopril-hydrochlorothiazide (ZESTORETIC) 20-12.5 MG tablet, Take 1 tablet by mouth daily., Disp: 90 tablet, Rfl: 0 .  loratadine (CLARITIN) 10 MG tablet, Take 10 mg by mouth daily as needed. , Disp: , Rfl:  .  OZEMPIC, 0.25 OR 0.5 MG/DOSE, 2 MG/1.5ML SOPN, Inject 0.5 mg into the skin once a week., Disp: 1.5 mL, Rfl: 2 .  rosuvastatin (CRESTOR) 20 MG tablet, Take 1 tablet (20 mg total) by mouth daily., Disp: 90 tablet, Rfl: 0 .  oxyCODONE (ROXICODONE) 5 MG immediate release tablet, Take 1 tablet (5 mg total) by mouth  daily as needed for severe pain., Disp: 15 tablet, Rfl: 0  Review of Systems  Constitutional: Negative.   HENT: Positive for congestion.   Respiratory: Positive for cough.   Cardiovascular: Negative.   Musculoskeletal: Negative.   Neurological: Positive for headaches.    Social History   Tobacco Use  . Smoking status: Current Every Day Smoker    Packs/day: 1.50    Years: 44.50    Pack years: 66.75    Types: Cigarettes  . Smokeless tobacco: Never Used  . Tobacco comment: 12/23/19 states she quit for 3 months and then started back. Pt is going to talk with MD about this.  Substance Use Topics  . Alcohol use: Yes    Comment: 1 drink every 3-4 months      Objective:   There were no vitals taken for this visit. There were no vitals filed for this visit.There is no height or weight on file to calculate BMI.   Physical Exam  Awake, alert, oriented x 3. In no apparent distress  No results found for any visits on 01/27/20.     Assessment & Plan    1. Acute frontal sinusitis, recurrence not specified  - cefdinir (OMNICEF) 300 MG capsule; Take 1 capsule (300 mg total) by mouth 2 (two) times daily for 10 days.  Dispense: 20 capsule; Refill: 0 Call if symptoms change or if not rapidly improving.     2. Type 2 diabetes mellitus without complication, without long-term current use of insulin ([place]) Doing well with Ozempic. She does not want to come to office until she gets her Covid vaccine. a1c has been very stable. Will hopefully able to get vaccine in the next month or two. Will contact her in April to schedule diabetes follow up.      I discussed the assessment and treatment plan with the patient. The patient was provided an opportunity to ask questions and all were answered. The patient agreed with the plan and demonstrated an understanding of the instructions.   The patient was advised to call back or seek an in-person evaluation if the symptoms worsen or if the condition  fails to improve as anticipated.  I provided 12 minutes of non-face-to-face time during this encounter.    Lelon Huh, MD  [facility]

## 2020-01-27 ENCOUNTER — Ambulatory Visit (INDEPENDENT_AMBULATORY_CARE_PROVIDER_SITE_OTHER): Payer: Medicare Other | Admitting: Family Medicine

## 2020-01-27 ENCOUNTER — Encounter: Payer: Self-pay | Admitting: Family Medicine

## 2020-01-27 DIAGNOSIS — J0110 Acute frontal sinusitis, unspecified: Secondary | ICD-10-CM | POA: Diagnosis not present

## 2020-01-27 DIAGNOSIS — E119 Type 2 diabetes mellitus without complications: Secondary | ICD-10-CM | POA: Diagnosis not present

## 2020-01-27 MED ORDER — CEFDINIR 300 MG PO CAPS: 300.0000 mg | ORAL_CAPSULE | Freq: Two times a day (BID) | ORAL | 0 refills | Status: AC

## 2020-01-28 ENCOUNTER — Ambulatory Visit: Payer: Federal, State, Local not specified - PPO | Admitting: Physical Therapy

## 2020-02-04 NOTE — Progress Notes (Signed)
Subjective:   Laura Nielsen is a 64 y.o. female who presents for Medicare Annual (Subsequent) preventive examination.    This visit is being conducted through telemedicine due to the COVID-19 pandemic. This patient has given me verbal consent via doximity to conduct this visit, patient states they are participating from their home address. Some vital signs may be absent or patient reported.    Patient identification: identified by name, DOB, and current address  Review of Systems:  N/A  Cardiac Risk Factors include: diabetes mellitus;obesity (BMI >30kg/m2);smoking/ tobacco exposure;dyslipidemia;hypertension     Objective:     Vitals: There were no vitals taken for this visit.  There is no height or weight on file to calculate BMI. Unable to obtain vitals due to visit being conducted via telephonically.   Advanced Directives 02/05/2020 01/15/2020 02/04/2019 07/26/2017 07/04/2017 06/11/2017 04/26/2017  Does Patient Have a Medical Advance Directive? No No No No No No No  Type of Advance Directive - - - - - - -  Would patient like information on creating a medical advance directive? No - Patient declined - No - Patient declined No - Patient declined - No - Patient declined -    Tobacco Social History   Tobacco Use  Smoking Status Current Every Day Smoker  . Packs/day: 1.50  . Years: 44.50  . Pack years: 66.75  . Types: Cigarettes  Smokeless Tobacco Never Used  Tobacco Comment   12/23/19 states she quit for 3 months and then started back. Pt is going to talk with MD about this.     Ready to quit: No Counseling given: No Comment: 12/23/19 states she quit for 3 months and then started back. Pt is going to talk with MD about this.   Clinical Intake:  Pre-visit preparation completed: Yes  Pain : 0-10 Pain Score: 5  Pain Type: Chronic pain Pain Location: Back Pain Orientation: Lower Pain Descriptors / Indicators: Aching Pain Frequency: Constant     Nutritional Risks:  None Diabetes: Yes  How often do you need to have someone help you when you read instructions, pamphlets, or other written materials from your doctor or pharmacy?: 1 - Never   Diabetes:  Is the patient diabetic?  Yes  If diabetic, was a CBG obtained today?  No  Did the patient bring in their glucometer from home?  No  How often do you monitor your CBG's? Does not check BS at all.   Financial Strains and Diabetes Management:  Are you having any financial strains with the device, your supplies or your medication? No .  Does the patient want to be seen by Chronic Care Management for management of their diabetes?  No  Would the patient like to be referred to a Nutritionist or for Diabetic Management?  No   Diabetic Exams:  Diabetic Eye Exam: Completed 01/09/18. Overdue for diabetic eye exam. Pt has been advised about the importance in completing this exam.   Diabetic Foot Exam: Completed 08/31/14. Pt has been advised about the importance in completing this exam. Note made to follow up on this at next in office visit.     Interpreter Needed?: No  Information entered by :: [facility], LPN  Past Medical History:  Diagnosis Date  . Allergy   . Arthritis   . Asthma   . Depression   . Diabetes mellitus without complication ([place])   . Emphysema of lung ([place])   . GERD (gastroesophageal reflux disease)   . Hyperlipidemia   . Hypertension   .  Osteoporosis   . Tobacco abuse counseling    Past Surgical History:  Procedure Laterality Date  . Cardiac catheterization  3/210   70-80% stenosis RCA stent placed. started on Plavix  . [place]  . TUBAL LIGATION    . VAGINAL HYSTERECTOMY     Menometrorrhagia. Excessive bleeding. Unknown if cervix removed.   . vascular stent  03/28/2011   Dr. Delana Meyer, [facility]; Infrarenal   Family History  Problem Relation Age of Onset  . Hypertension Mother   . Coronary artery disease Mother   . Heart attack Mother        acute  . Alcohol  abuse Father   . Depression Father   . Hypertension Father   . Heart attack Father 35       acute  . Alcohol abuse Sister   . Hyperlipidemia Sister   . Hypertension Sister   . Cancer Sister 71  . Heart attack Sister        x's 2  . Coronary artery disease Sister 64       x's 2  . Breast cancer Sister 6   Social History   Socioeconomic History  . Marital status: Divorced    Spouse name: Not on file  . Number of children: 2  . Years of education: H/S  . Highest education level: High school graduate  Occupational History  . Occupation: Disabled  . Occupation: retired  Tobacco Use  . Smoking status: Current Every Day Smoker    Packs/day: 1.50    Years: 44.50    Pack years: 66.75    Types: Cigarettes  . Smokeless tobacco: Never Used  . Tobacco comment: 12/23/19 states she quit for 3 months and then started back. Pt is going to talk with MD about this.  Substance and Sexual Activity  . Alcohol use: Yes    Comment: 1 drink every 3-4 months  . Drug use: No  . Sexual activity: Not on file  Other Topics Concern  . Not on file  Social History Narrative  . Not on file   Social Determinants of Health   Financial Resource Strain: Low Risk   . Difficulty of Paying Living Expenses: Not hard at all  Food Insecurity: No Food Insecurity  . Worried About Charity fundraiser in the Last Year: Never true  . Ran Out of Food in the Last Year: Never true  Transportation Needs: No Transportation Needs  . Lack of Transportation (Medical): No  . Lack of Transportation (Non-Medical): No  Physical Activity: Inactive  . Days of Exercise per Week: 0 days  . Minutes of Exercise per Session: 0 min  Stress: No Stress Concern Present  . Feeling of Stress : Only a little  Social Connections: Moderately Isolated  . Frequency of Communication with Friends and Family: More than three times a week  . Frequency of Social Gatherings with Friends and Family: Never  . Attends Religious Services:  Never  . Active Member of Clubs or Organizations: No  . Attends Archivist Meetings: Never  . Marital Status: Divorced    Outpatient Encounter Medications as of 02/05/2020  Medication Sig  . albuterol (PROVENTIL HFA) 108 (90 Base) MCG/ACT inhaler Inhale 2 puffs into the lungs every 6 (six) hours as needed for wheezing or shortness of breath.  . allopurinol (ZYLOPRIM) 100 MG tablet Take 1 tablet (100 mg total) by mouth daily.  Marland Kitchen aspirin 81 MG tablet Take 81 mg by mouth daily.   Marland Kitchen  bisoprolol (ZEBETA) 10 MG tablet Take 1 tablet (10 mg total) by mouth daily.  . cefdinir (OMNICEF) 300 MG capsule Take 1 capsule (300 mg total) by mouth 2 (two) times daily for 10 days.  . clopidogrel (PLAVIX) 75 MG tablet Take 1 tablet (75 mg total) by mouth daily.  . cyanocobalamin (,VITAMIN B-12,) 1000 MCG/ML injection Inject 26ml IM once a week for four weeks then once a month for four months.  . cyanocobalamin 1000 MCG tablet Take 1,000 mcg by mouth daily.  Marland Kitchen gabapentin (NEURONTIN) 300 MG capsule Take 1 capsule (300 mg total) by mouth 3 (three) times daily. Take 1 capsule (300 mg total) by mouth 3 (three) times daily.  . Ibuprofen 200 MG CAPS Take by mouth.   Marland Kitchen lisinopril-hydrochlorothiazide (ZESTORETIC) 20-12.5 MG tablet Take 1 tablet by mouth daily.  Marland Kitchen loratadine (CLARITIN) 10 MG tablet Take 10 mg by mouth daily as needed.   Marland Kitchen OZEMPIC, 0.25 OR 0.5 MG/DOSE, 2 MG/1.5ML SOPN Inject 0.5 mg into the skin once a week.  . rosuvastatin (CRESTOR) 20 MG tablet Take 1 tablet (20 mg total) by mouth daily.  Marland Kitchen oxyCODONE (ROXICODONE) 5 MG immediate release tablet Take 1 tablet (5 mg total) by mouth daily as needed for severe pain.   No facility-administered encounter medications on file as of 02/05/2020.    Activities of Daily Living In your present state of health, do you have any difficulty performing the following activities: 02/05/2020  Hearing? N  Vision? N  Difficulty concentrating or making decisions? Y   Comment Currently seeing a neurologist for memory issues.  Walking or climbing stairs? Y  Comment Due to back pain.  Dressing or bathing? N  Doing errands, shopping? N  Preparing Food and eating ? N  Using the Toilet? N  In the past six months, have you accidently leaked urine? Y  Comment Occasionally with urges.  Do you have problems with loss of bowel control? N  Managing your Medications? N  Managing your Finances? N  Housekeeping or managing your Housekeeping? N  Some recent data might be hidden    Patient Care Team: Birdie Sons, MD as PCP - General (Family Medicine) Ubaldo Glassing, Javier Docker, MD as Consulting Physician (Cardiology) Schnier, Dolores Lory, MD (Vascular Surgery) Vladimir Crofts, MD as Consulting Physician (Neurology) Milinda Pointer, MD as Referring Physician (Pain Medicine) Pa, [place] (Optometry)    Assessment:   This is a routine wellness examination for Laura Nielsen.  Exercise Activities and Dietary recommendations Current Exercise Habits: The patient does not participate in regular exercise at present, Exercise limited by: orthopedic condition(s)  Goals    . Have 3 meals a day     Recommend to decrease portion sizes by eating 3 small healthy meals and at least 2 healthy snacks per day.    . Quit Smoking     Recommend to continue efforts to reduce smoking habits until no longer smoking.        Fall Risk: Fall Risk  02/05/2020 02/04/2019 01/07/2019 02/05/2018 01/30/2018  Falls in the past year? 1 0 1 No No  Number falls in past yr: 0 - 0 - -  Injury with Fall? 0 - 0 - -  Risk for fall due to : Impaired balance/gait - - - -  Follow up Falls prevention discussed - - - -    FALL RISK PREVENTION PERTAINING TO THE HOME:  Any stairs in or around the home? Yes  If so, are there any  without handrails? No   Home free of loose throw rugs in walkways, pet beds, electrical cords, etc? Yes  Adequate lighting in your home to reduce risk of falls? Yes    ASSISTIVE DEVICES UTILIZED TO PREVENT FALLS:  Life alert? No  Use of a cane, walker or w/c? No  Grab bars in the bathroom? No  Shower chair or bench in shower? No  Elevated toilet seat or a handicapped toilet? No   TIMED UP AND GO:  Was the test performed? No .    Depression Screen PHQ 2/9 Scores 02/05/2020 02/04/2019 02/05/2018 01/30/2018  PHQ - 2 Score 1 2 0 1  PHQ- 9 Score - 6 - 8     Cognitive Function: Declined today.        Immunization History  Administered Date(s) Administered  . Pneumococcal Polysaccharide-23 09/25/2016  . Tdap 08/30/2017    Qualifies for Shingles Vaccine? Yes . Due for Shingrix. Pt has been advised to call insurance company to determine out of pocket expense. Advised may also receive vaccine at local pharmacy or Health Dept. Verbalized acceptance and understanding.  Tdap: Up to date  Flu Vaccine: Due for Flu vaccine. Does the patient want to receive this vaccine today?  No . Advised may receive this vaccine at local pharmacy or Health Dept. Aware to provide a copy of the vaccination record if obtained from local pharmacy or Health Dept. Verbalized acceptance and understanding.   Screening Tests Health Maintenance  Topic Date Due  . FOOT EXAM  12/14/1966  . OPHTHALMOLOGY EXAM  01/09/2019  . HEMOGLOBIN A1C  12/11/2019  . INFLUENZA VACCINE  03/03/2020 (Originally 07/05/2019)  . Fecal DNA (Cologuard)  09/17/2020  . MAMMOGRAM  01/07/2021  . PAP SMEAR-Modifier  01/30/2021  . TETANUS/TDAP  08/31/2027  . PNEUMOCOCCAL POLYSACCHARIDE VACCINE AGE 60-64 HIGH RISK  Completed  . Hepatitis C Screening  Completed  . HIV Screening  Completed    Cancer Screenings:  Colorectal Screening: Cologuard completed 09/17/17. Repeat every 3 years.   Mammogram: Completed 01/27/19. Repeat every 1-2 years as advised. Scheduled 02/13/20.  Lung Cancer Screening: (Low Dose CT Chest recommended if Age 35-80 years, 30 pack-year currently smoking OR have quit w/in 15years.)  does qualify however had this completed 07/15/19. Needs to be repeated yearly.   Additional Screening:  Hepatitis C Screening: Up to date  Dental Screening: Recommended annual dental exams for proper oral hygiene   Community Resource Referral:  CRR required this visit?  No       Plan:  I have personally reviewed and addressed the Medicare Annual Wellness questionnaire and have noted the following in the patient's chart:  A. Medical and social history B. Use of alcohol, tobacco or illicit drugs  C. Current medications and supplements D. Functional ability and status E.  Nutritional status F.  Physical activity G. Advance directives H. List of other physicians I.  Hospitalizations, surgeries, and ER visits in previous 12 months J.  [place] such as hearing and vision if needed, cognitive and depression L. Referrals and appointments   In addition, I have reviewed and discussed with patient certain preventive protocols, quality metrics, and best practice recommendations. A written personalized care plan for preventive services as well as general preventive health recommendations were provided to patient.   [place]endora Score, [place]  12/10/6158 Nurse Health Advisor   Nurse Notes: Pt needs a Hgb A1c check and diabetic foot exam and next in office visit. Pt declines scheduling  an eye exam at this time.

## 2020-02-05 ENCOUNTER — Other Ambulatory Visit: Payer: Self-pay

## 2020-02-05 ENCOUNTER — Ambulatory Visit (INDEPENDENT_AMBULATORY_CARE_PROVIDER_SITE_OTHER): Payer: Medicare Other

## 2020-02-05 DIAGNOSIS — Z0000 Encounter for general adult medical examination without abnormal findings: Secondary | ICD-10-CM

## 2020-02-05 NOTE — Patient Instructions (Signed)
Laura Nielsen , Thank you for taking time to come for your Medicare Wellness Visit. I appreciate your ongoing commitment to your health goals. Please review the following plan we discussed and let me know if I can assist you in the future.   Screening recommendations/referrals: Colonoscopy: Cologuard up to date, due 09/17/20 Mammogram: Up to date, scheduled 02/13/20 Recommended yearly ophthalmology/optometry visit for glaucoma screening and checkup Recommended yearly dental visit for hygiene and checkup  Vaccinations: Influenza vaccine: Pt declines today.  Tdap vaccine: Up to date, due 08/2027 Shingles vaccine: Pt declines today.     Advanced directives: Advance directive discussed with you today. Even though you declined this today please call our office should you change your mind and we can give you the proper paperwork for you to fill out.  Conditions/risks identified: Smoking cessation discussed today. Recommend to cut out of sweets and junk food an focus on healthy snacks such as fruits and proteins.  Next appointment: 03/30/20 @ 11:00 AM with Caryn Section.   Preventive Care 40-64 Years, Female Preventive care refers to lifestyle choices and visits with your health care provider that can promote health and wellness. What does preventive care include?  A yearly physical exam. This is also called an annual well check.  Dental exams once or twice a year.  Routine eye exams. Ask your health care provider how often you should have your eyes checked.  Personal lifestyle choices, including:  Daily care of your teeth and gums.  Regular physical activity.  Eating a healthy diet.  Avoiding tobacco and drug use.  Limiting alcohol use.  Practicing safe sex.  Taking low-dose aspirin daily starting at age 83.  Taking vitamin and mineral supplements as recommended by your health care provider. What happens during an annual well check? The services and screenings done by your health care  provider during your annual well check will depend on your age, overall health, lifestyle risk factors, and family history of disease. Counseling  Your health care provider may ask you questions about your:  Alcohol use.  Tobacco use.  Drug use.  Emotional well-being.  Home and relationship well-being.  Sexual activity.  Eating habits.  Work and work Statistician.  Method of birth control.  Menstrual cycle.  Pregnancy history. Screening  You may have the following tests or measurements:  Height, weight, and BMI.  Blood pressure.  Lipid and cholesterol levels. These may be checked every 5 years, or more frequently if you are over 52 years old.  Skin check.  Lung cancer screening. You may have this screening every year starting at age 48 if you have a 30-pack-year history of smoking and currently smoke or have quit within the past 15 years.  Fecal occult blood test (FOBT) of the stool. You may have this test every year starting at age 29.  Flexible sigmoidoscopy or colonoscopy. You may have a sigmoidoscopy every 5 years or a colonoscopy every 10 years starting at age 8.  Hepatitis C blood test.  Hepatitis B blood test.  Sexually transmitted disease (STD) testing.  Diabetes screening. This is done by checking your blood sugar (glucose) after you have not eaten for a while (fasting). You may have this done every 1-3 years.  Mammogram. This may be done every 1-2 years. Talk to your health care provider about when you should start having regular mammograms. This may depend on whether you have a family history of breast cancer.  BRCA-related cancer screening. This may be done if you have  a family history of breast, ovarian, tubal, or peritoneal cancers.  Pelvic exam and Pap test. This may be done every 3 years starting at age 6. Starting at age 25, this may be done every 5 years if you have a Pap test in combination with an HPV test.  Bone density scan. This is done  to screen for osteoporosis. You may have this scan if you are at high risk for osteoporosis. Discuss your test results, treatment options, and if necessary, the need for more tests with your health care provider. Vaccines  Your health care provider may recommend certain vaccines, such as:  Influenza vaccine. This is recommended every year.  Tetanus, diphtheria, and acellular pertussis (Tdap, Td) vaccine. You may need a Td booster every 10 years.  Zoster vaccine. You may need this after age 97.  Pneumococcal 13-valent conjugate (PCV13) vaccine. You may need this if you have certain conditions and were not previously vaccinated.  Pneumococcal polysaccharide (PPSV23) vaccine. You may need one or two doses if you smoke cigarettes or if you have certain conditions. Talk to your health care provider about which screenings and vaccines you need and how often you need them. This information is not intended to replace advice given to you by your health care provider. Make sure you discuss any questions you have with your health care provider. Document Released: 12/17/2015 Document Revised: 08/09/2016 Document Reviewed: 09/21/2015 Elsevier Interactive Patient Education  2017 [place] Prevention in the Home Falls can cause injuries. They can happen to people of all ages. There are many things you can do to make your home safe and to help prevent falls. What can I do on the outside of my home?  Regularly fix the edges of walkways and driveways and fix any cracks.  Remove anything that might make you trip as you walk through a door, such as a raised step or threshold.  Trim any bushes or trees on the path to your home.  Use bright outdoor lighting.  Clear any walking paths of anything that might make someone trip, such as rocks or tools.  Regularly check to see if handrails are loose or broken. Make sure that both sides of any steps have handrails.  Any raised decks and porches  should have guardrails on the edges.  Have any leaves, snow, or ice cleared regularly.  Use sand or salt on walking paths during winter.  Clean up any spills in your garage right away. This includes oil or grease spills. What can I do in the bathroom?  Use night lights.  Install grab bars by the toilet and in the tub and shower. Do not use towel bars as grab bars.  Use non-skid mats or decals in the tub or shower.  If you need to sit down in the shower, use a plastic, non-slip stool.  Keep the floor dry. Clean up any water that spills on the floor as soon as it happens.  Remove soap buildup in the tub or shower regularly.  Attach bath mats securely with double-sided non-slip rug tape.  Do not have throw rugs and other things on the floor that can make you trip. What can I do in the bedroom?  Use night lights.  Make sure that you have a light by your bed that is easy to reach.  Do not use any sheets or blankets that are too big for your bed. They should not hang down onto the floor.  Have a  firm chair that has side arms. You can use this for support while you get dressed.  Do not have throw rugs and other things on the floor that can make you trip. What can I do in the kitchen?  Clean up any spills right away.  Avoid walking on wet floors.  Keep items that you use a lot in easy-to-reach places.  If you need to reach something above you, use a strong step stool that has a grab bar.  Keep electrical cords out of the way.  Do not use floor polish or wax that makes floors slippery. If you must use wax, use non-skid floor wax.  Do not have throw rugs and other things on the floor that can make you trip. What can I do with my stairs?  Do not leave any items on the stairs.  Make sure that there are handrails on both sides of the stairs and use them. Fix handrails that are broken or loose. Make sure that handrails are as long as the stairways.  Check any carpeting to  make sure that it is firmly attached to the stairs. Fix any carpet that is loose or worn.  Avoid having throw rugs at the top or bottom of the stairs. If you do have throw rugs, attach them to the floor with carpet tape.  Make sure that you have a light switch at the top of the stairs and the bottom of the stairs. If you do not have them, ask someone to add them for you. What else can I do to help prevent falls?  Wear shoes that:  Do not have high heels.  Have rubber bottoms.  Are comfortable and fit you well.  Are closed at the toe. Do not wear sandals.  If you use a stepladder:  Make sure that it is fully opened. Do not climb a closed stepladder.  Make sure that both sides of the stepladder are locked into place.  Ask someone to hold it for you, if possible.  Clearly mark and make sure that you can see:  Any grab bars or handrails.  First and last steps.  Where the edge of each step is.  Use tools that help you move around (mobility aids) if they are needed. These include:  Canes.  Walkers.  Scooters.  Crutches.  Turn on the lights when you go into a dark area. Replace any light bulbs as soon as they burn out.  Set up your furniture so you have a clear path. Avoid moving your furniture around.  If any of your floors are uneven, fix them.  If there are any pets around you, be aware of where they are.  Review your medicines with your doctor. Some medicines can make you feel dizzy. This can increase your chance of falling. Ask your doctor what other things that you can do to help prevent falls. This information is not intended to replace advice given to you by your health care provider. Make sure you discuss any questions you have with your health care provider. Document Released: 09/16/2009 Document Revised: 04/27/2016 Document Reviewed: 12/25/2014 Elsevier Interactive Patient Education  2017 [employer].

## 2020-02-09 ENCOUNTER — Other Ambulatory Visit: Payer: Self-pay | Admitting: Family Medicine

## 2020-02-09 DIAGNOSIS — E7800 Pure hypercholesterolemia, unspecified: Secondary | ICD-10-CM

## 2020-02-09 DIAGNOSIS — I10 Essential (primary) hypertension: Secondary | ICD-10-CM

## 2020-02-10 ENCOUNTER — Other Ambulatory Visit: Payer: Self-pay | Admitting: Family Medicine

## 2020-02-10 ENCOUNTER — Other Ambulatory Visit: Payer: Self-pay

## 2020-02-10 ENCOUNTER — Ambulatory Visit: Payer: Medicare Other | Attending: Neurology | Admitting: Physical Therapy

## 2020-02-10 ENCOUNTER — Encounter: Payer: Self-pay | Admitting: Physical Therapy

## 2020-02-10 DIAGNOSIS — R262 Difficulty in walking, not elsewhere classified: Secondary | ICD-10-CM | POA: Insufficient documentation

## 2020-02-10 DIAGNOSIS — M6281 Muscle weakness (generalized): Secondary | ICD-10-CM | POA: Insufficient documentation

## 2020-02-10 DIAGNOSIS — R2689 Other abnormalities of gait and mobility: Secondary | ICD-10-CM | POA: Diagnosis not present

## 2020-02-10 NOTE — Therapy (Signed)
[place] MAIN [facility] SERVICES [address] [place], [place], [address] Phone: [phone]   Fax:  [phone]  Physical Therapy Treatment  Patient Details  Name: Laura Nielsen MRN: [id number] Date of Birth: Mar 29, 1956 Referring Provider (PT): shah Hemang   Encounter Date: 02/10/2020  PT End of Session - 02/10/20 1021    Visit Number  2    Number of Visits  17    Date for PT Re-Evaluation  03/11/20    PT Start Time  1016    PT Stop Time  1100    PT Time Calculation (min)  44 min    Equipment Utilized During Treatment  Gait belt    Activity Tolerance  Patient tolerated treatment well       Past Medical History:  Diagnosis Date  . Allergy   . Arthritis   . Asthma   . Depression   . Diabetes mellitus without complication ([place])   . Emphysema of lung ([place])   . GERD (gastroesophageal reflux disease)   . Hyperlipidemia   . Hypertension   . Osteoporosis   . Tobacco abuse counseling     Past Surgical History:  Procedure Laterality Date  . Cardiac catheterization  3/210   70-80% stenosis RCA stent placed. started on Plavix  . [place]  . TUBAL LIGATION    . VAGINAL HYSTERECTOMY     Menometrorrhagia. Excessive bleeding. Unknown if cervix removed.   . vascular stent  03/28/2011   Dr. Delana Meyer, [facility]; Infrarenal    There were no vitals filed for this visit.  Subjective Assessment - 02/10/20 1020    Subjective  Patient reports working on exercises at home. She reports heel raises went okay but the sidelying hip abduction was significantly difficult and would increase hip/back pain. She reports having increased back pain this session;    Pertinent History  Patient began having symprtoms of weakness, and poor balance and difficulty finding words 5 years ago. She walks without any device. She has had 1 fall and landed on the bed 1 year ago.    Limitations  Standing;Walking    How long can you sit comfortably?  unlimited    How long can you stand comfortably?  5 mins    How long can you walk comfortably?  40 feet    Patient Stated Goals  to walk better    Currently in Pain?  Yes    Pain Score  6     Pain Location  Back    Pain Orientation  Lower    Pain Descriptors / Indicators  Aching;Sore    Pain Type  Chronic pain    Pain Onset  More than a month ago    Pain Frequency  Constant    Aggravating Factors   standing    Pain Relieving Factors  sitting    Effect of Pain on Daily Activities  difficult to walk and move;    Multiple Pain Sites  No          TREATMENT: Warm up on Nustep BUE/BLE level 2 x4 min (unbilled);   Instructed patient in BLE strengthening, standing with red tband around BLE: -hip extension x10 reps; -hip abduction x10 reps; -hip flexion x10 reps; Patient required min-moderate verbal/tactile cues for correct exercise technique including to avoid trunk lean for better hip strengthening;   Pt able to don/doff tband well.   Seated with 2# ankle weight: -hip flexion march x10 reps bilaterally; -LAQ with  ankle DF x10 reps bilaterally;  Standing with 2# ankle weights:  -hip flexion march x10 reps bilaterally -heel/toe raises x15 reps; -hamstring curl x10 reps bilaterally; Required min VCs to improve erect posture for less back discomfort and min VCs to increase AROM for better hip strengthening;    Response to treatment: Pt tolerated session fair, she does fatigue quickly requiring short seated rest breaks. Vitals monitored, HR 85, SPo2 97% with shortness of breath noted.   Advanced HEP- see patient instructions; Educated patient in ways to improve cardiovascular endurance with utilizing home exercise bike for exercise.           PT Education - 02/10/20 1021    Education Details  LE strengthening, HEP    Person(s) Educated  Patient    Methods  Explanation;Verbal cues    Comprehension  Verbalized understanding;Returned demonstration;Verbal cues required;Need further  instruction       PT Short Term Goals - 01/15/20 1428      PT SHORT TERM GOAL #1   Title  Patient will be independent in home exercise program to improve strength/mobility for better functional independence with ADLs.    Time  4    Period  Weeks    Status  New    Target Date  02/12/20      PT SHORT TERM GOAL #2   Title  Patient (> 18 years old) will complete five times sit to stand test in < 15 seconds indicating an increased LE strength and improved balance.    Time  4    Period  Weeks    Status  New    Target Date  02/12/20        PT Long Term Goals - 01/15/20 1429      PT LONG TERM GOAL #1   Title  Patient will increase Berg Balance score by > 6 points to demonstrate decreased fall risk during functional activities    Time  8    Period  Weeks    Status  New    Target Date  03/11/20      PT LONG TERM GOAL #2   Title  Patient will increase six minute walk test distance to >1000 for progression to community ambulator and improve gait ability    Time  8    Period  Weeks    Status  New    Target Date  03/11/20      PT LONG TERM GOAL #3   Title  Patient will increase 10 meter walk test to >1.36m/s as to improve gait speed for better community ambulation and to reduce fall risk.    Time  8    Period  Weeks    Status  New    Target Date  03/11/20            Plan - 02/10/20 1043    Clinical Impression Statement  Patient motivated and participated well within session. Instructed patient in BLE strengthening exercise. Patient does fatigue quickly requiring short seated rest breaks. She also required min VCs for proper positioning and exercise technique to avoid back discomfort and to improve hip strength.Vitals monitored with SPO2 and HR with good results noted. Patient would benefit from additional skilled PT intervention to improve strength, balance and gait safety;    Personal Factors and Comorbidities  Comorbidity 1;Comorbidity 2    Comorbidities  diabetes,CHF     Examination-Activity Limitations  Bathing;Locomotion Level;Stand;Bed Mobility    Examination-Participation Restrictions  Cleaning;Community Activity  Stability/Clinical Decision Making  Stable/Uncomplicated    Rehab Potential  Fair    PT Frequency  2x / week    PT Duration  8 weeks    PT Treatment/Interventions  Therapeutic activities;Therapeutic exercise;Balance training;Neuromuscular re-education;Patient/family education;Gait training;Moist Heat;Electrical Stimulation;Manual techniques    PT Next Visit Plan  balance and strengthening    Consulted and Agree with Plan of Care  Patient       Patient will benefit from skilled therapeutic intervention in order to improve the following deficits and impairments:  Abnormal gait, Decreased endurance, Decreased range of motion, Decreased strength, Pain, Impaired sensation, Dizziness, Decreased mobility, Difficulty walking, Obesity  Visit Diagnosis: Muscle weakness (generalized)  Difficulty in walking, not elsewhere classified  Other abnormalities of gait and mobility     Problem List Patient Active Problem List   Diagnosis Date Noted  . Aortic atherosclerosis ([place]) 07/17/2019  . Tobacco abuse 01/10/2019  . Unspecified inflammatory spondylopathy, lumbar region ([place]) 10/04/2018  . Abnormal MRI, lumbar spine (03/28/2017) 07/04/2017  . Abnormal MRI, cervical spine (03/28/2017) 07/04/2017  . Lumbar facet syndrome (Bilateral) (L>R) 04/26/2017  . Lumbar facet hypertrophy (multilevel) (Bilateral) 04/26/2017  . Long term current use of anticoagulant therapy 04/26/2017  . Neurogenic pain 04/26/2017  . Musculoskeletal pain 04/26/2017  . Chronic pain syndrome 03/08/2017  . Long term prescription opiate use 03/08/2017  . Opiate use 03/08/2017  . Chronic low back pain (Primary Area of Pain) (Bilateral) (L>R) 03/08/2017  . Chronic neck pain (Secondary area of Pain) (Bilateral) (R>L) 03/08/2017  . Cervicogenic headache (Right) 03/08/2017  .  Chronic upper back pain (Third area of Pain) (Bilateral) (R>L) 03/08/2017  . Chronic hip pain (Bilateral) (L>R) 03/08/2017  . Osteoarthritis of hip (Bilateral) (L>R) 03/08/2017  . Cervical central spinal stenosis 03/08/2017  . Cervical spondylosis with radiculopathy (Right) (C5) 03/08/2017  . Lumbar spondylosis 03/08/2017  . Atherosclerosis of native arteries of extremity with intermittent claudication ([place]) 02/22/2017  . Gout 02/16/2016  . Ankle pain 11/17/2015  . Arthritis 05/14/2015  . Carotid artery narrowing 05/14/2015  . Claudication ([place]) 05/14/2015  . CAFL (chronic airflow limitation) ([place]) 05/14/2015  . Clinical depression 05/14/2015  . Acid reflux 05/14/2015  . Urinary incontinence 05/14/2015  . Pins and needles sensation 05/14/2015  . Obstructive apnea 05/14/2015  . Morbid obesity ([place]) 05/14/2015  . History of abnormal cervical Papanicolaou smear 05/14/2015  . Peripheral blood vessel disorder ([place]) 05/14/2015  . B12 deficiency 05/14/2015  . Vitamin D insufficiency 05/14/2015  . AAA (abdominal aortic aneurysm) without rupture ([place]) 05/26/2014  . Aortic heart valve narrowing 01/31/2013  . Malaise and fatigue 09/26/2012  . CAD in native artery 03/17/2009  . Diabetes mellitus, type 2 ([place]) 02/05/2009  . Hypercholesteremia 06/24/2008  . Allergic rhinitis 10/25/2007  . Airway hyperreactivity 10/25/2007  . Smoking greater than 30 pack years 10/25/2007  . Narrowing of intervertebral disc space 10/25/2007  . Benign essential HTN 10/25/2007  . Arthritis, degenerative 10/25/2007    Dinia Joynt PT, DPT 02/10/2020, 12:13 PM  [place] MAIN [facility] SERVICES [address] [place], [place], [address] Phone: [phone]   Fax:  [phone]  Name: Laura Nielsen MRN: [id number] Date of Birth: Mar 08, 1956

## 2020-02-10 NOTE — Patient Instructions (Signed)
Access Code: [email or web address]  URL: [email or web address]  Date: 02/10/2020  Prepared by: Blanche East   Exercises  Recumbent Bike - 1x daily - 7x weekly  Standing Hip Extension with Resistance at Ankles and Counter Support - 20 reps - 2 sets - 1x daily - 7x weekly  Standing Hip Abduction with Resistance at Ankles and Counter Support - 20 reps - 2 sets - 1x daily - 7x weekly  Standing Hip Flexion with Resistance at Ankles and Counter Support - 20 reps - 2 sets - 1x daily - 7x weekly

## 2020-02-13 ENCOUNTER — Ambulatory Visit: Payer: Medicare Other

## 2020-02-13 ENCOUNTER — Other Ambulatory Visit: Payer: Self-pay

## 2020-02-13 ENCOUNTER — Ambulatory Visit
Admission: RE | Admit: 2020-02-13 | Discharge: 2020-02-13 | Disposition: A | Discharge status: Home / Self Care | Payer: Medicare Other | Source: Ambulatory Visit | Attending: Family Medicine | Admitting: Family Medicine

## 2020-02-13 DIAGNOSIS — R2689 Other abnormalities of gait and mobility: Secondary | ICD-10-CM | POA: Diagnosis not present

## 2020-02-13 DIAGNOSIS — M6281 Muscle weakness (generalized): Secondary | ICD-10-CM

## 2020-02-13 DIAGNOSIS — R262 Difficulty in walking, not elsewhere classified: Secondary | ICD-10-CM | POA: Diagnosis not present

## 2020-02-13 DIAGNOSIS — Z1231 Encounter for screening mammogram for malignant neoplasm of breast: Secondary | ICD-10-CM

## 2020-02-13 NOTE — Therapy (Signed)
[place] MAIN [facility] SERVICES [address] [place], [place], [address] Phone: [phone]   Fax:  [phone]  Physical Therapy Treatment  Patient Details  Name: Laura Nielsen MRN: [id number] Date of Birth: 01-20-1956 Referring Provider (PT): shah Hemang   Encounter Date: 02/13/2020  PT End of Session - 02/13/20 0950    Visit Number  3    Number of Visits  17    Date for PT Re-Evaluation  03/11/20    PT Start Time  0955    PT Stop Time  1040    PT Time Calculation (min)  45 min    Equipment Utilized During Treatment  Gait belt    Activity Tolerance  Patient tolerated treatment well       Past Medical History:  Diagnosis Date  . Allergy   . Arthritis   . Asthma   . Depression   . Diabetes mellitus without complication ([place])   . Emphysema of lung ([place])   . GERD (gastroesophageal reflux disease)   . Hyperlipidemia   . Hypertension   . Osteoporosis   . Tobacco abuse counseling     Past Surgical History:  Procedure Laterality Date  . Cardiac catheterization  3/210   70-80% stenosis RCA stent placed. started on Plavix  . [place]  . TUBAL LIGATION    . VAGINAL HYSTERECTOMY     Menometrorrhagia. Excessive bleeding. Unknown if cervix removed.   . vascular stent  03/28/2011   Dr. Delana Meyer, [facility]; Infrarenal    There were no vitals filed for this visit.  Subjective Assessment - 02/13/20 0949    Subjective  Patient stated it is a bad day for her back pain. Has attempted her exercises, can do them in bouts but does bother her back. Pt reported the newer exercises are "not as bad"    Pertinent History  Patient began having symprtoms of weakness, and poor balance and difficulty finding words 5 years ago. She walks without any device. She has had 1 fall and landed on the bed 1 year ago.    Limitations  Standing;Walking    How long can you stand comfortably?  5 mins    How long can you walk comfortably?  40 feet    Patient Stated Goals  to walk better    Currently in Pain?  Yes    Pain Score  8     Pain Location  Back    Pain Orientation  Lower    Pain Descriptors / Indicators  Aching;Sore    Pain Type  Chronic pain    Pain Onset  More than a month ago         TREATMENT: Warm up on Nustep BUE/BLE level 2 x5 min (unbilled);    Supine: hot pack in place for pain management (unbilled) LTR x15 Piriformis stretch 3x30sec with PT assist SAQ 3# AW 2x15 Hip abduction/ER in supine with GTB 2x15 Marching in supine no weights 2x10  Instructed in log rolling technique with verbal cues pt able to complete  Sitting with back support: -hip flexion march 2x15 reps bilaterally; -LAQ 2x15reps bilaterally; Standing with 2# ankle weights:  -hip flexion march x10 reps bilaterally -heel/toe raises 2x15 reps; -hamstring curl x10 reps bilaterally with GTB; Required min VCs to improve erect posture for less back discomfort and min VCs to increase AROM for better hip strengthening;    Response to treatment: Activities modified due to pt reported increased low back pain.  Session focused on promoting LE strengthening. Pt reported unchanged back pain at end of session, 8/10. The patient would benefit from further skilled PT intervention to continue to address limitations and progress towards goals.     PT Education - 02/13/20 0949    Education Details  LE strengthening, therapeutic exercise/form    Person(s) Educated  Patient    Methods  Explanation;Verbal cues    Comprehension  Verbalized understanding;Returned demonstration;Verbal cues required;Need further instruction       PT Short Term Goals - 01/15/20 1428      PT SHORT TERM GOAL #1   Title  Patient will be independent in home exercise program to improve strength/mobility for better functional independence with ADLs.    Time  4    Period  Weeks    Status  New    Target Date  02/12/20      PT SHORT TERM GOAL #2   Title  Patient (> 14 years  old) will complete five times sit to stand test in < 15 seconds indicating an increased LE strength and improved balance.    Time  4    Period  Weeks    Status  New    Target Date  02/12/20        PT Long Term Goals - 01/15/20 1429      PT LONG TERM GOAL #1   Title  Patient will increase Berg Balance score by > 6 points to demonstrate decreased fall risk during functional activities    Time  8    Period  Weeks    Status  New    Target Date  03/11/20      PT LONG TERM GOAL #2   Title  Patient will increase six minute walk test distance to >1000 for progression to community ambulator and improve gait ability    Time  8    Period  Weeks    Status  New    Target Date  03/11/20      PT LONG TERM GOAL #3   Title  Patient will increase 10 meter walk test to >1.54m/s as to improve gait speed for better community ambulation and to reduce fall risk.    Time  8    Period  Weeks    Status  New    Target Date  03/11/20            Plan - 02/13/20 0950    Clinical Impression Statement  Activities modified due to pt reported increased low back pain. Session focused on promoting LE strengthening. Pt reported unchanged back pain at end of session, 8/10. The patient would benefit from further skilled PT intervention to continue to address limitations and progress towards goals.    Personal Factors and Comorbidities  Comorbidity 1;Comorbidity 2    Comorbidities  diabetes,CHF    Examination-Activity Limitations  Bathing;Locomotion Level;Stand;Bed Mobility    Examination-Participation Restrictions  Cleaning;Community Activity    Rehab Potential  Fair    PT Frequency  2x / week    PT Duration  8 weeks    PT Treatment/Interventions  Therapeutic activities;Therapeutic exercise;Balance training;Neuromuscular re-education;Patient/family education;Gait training;Moist Heat;Electrical Stimulation;Manual techniques    PT Next Visit Plan  balance and strengthening    Consulted and Agree with Plan of  Care  Patient       Patient will benefit from skilled therapeutic intervention in order to improve the following deficits and impairments:  Abnormal gait, Decreased endurance, Decreased range of motion, Decreased  strength, Pain, Impaired sensation, Dizziness, Decreased mobility, Difficulty walking, Obesity  Visit Diagnosis: Muscle weakness (generalized)  Difficulty in walking, not elsewhere classified  Other abnormalities of gait and mobility     Problem List Patient Active Problem List   Diagnosis Date Noted  . Aortic atherosclerosis ([place]) 07/17/2019  . Tobacco abuse 01/10/2019  . Unspecified inflammatory spondylopathy, lumbar region ([place]) 10/04/2018  . Abnormal MRI, lumbar spine (03/28/2017) 07/04/2017  . Abnormal MRI, cervical spine (03/28/2017) 07/04/2017  . Lumbar facet syndrome (Bilateral) (L>R) 04/26/2017  . Lumbar facet hypertrophy (multilevel) (Bilateral) 04/26/2017  . Long term current use of anticoagulant therapy 04/26/2017  . Neurogenic pain 04/26/2017  . Musculoskeletal pain 04/26/2017  . Chronic pain syndrome 03/08/2017  . Long term prescription opiate use 03/08/2017  . Opiate use 03/08/2017  . Chronic low back pain (Primary Area of Pain) (Bilateral) (L>R) 03/08/2017  . Chronic neck pain (Secondary area of Pain) (Bilateral) (R>L) 03/08/2017  . Cervicogenic headache (Right) 03/08/2017  . Chronic upper back pain (Third area of Pain) (Bilateral) (R>L) 03/08/2017  . Chronic hip pain (Bilateral) (L>R) 03/08/2017  . Osteoarthritis of hip (Bilateral) (L>R) 03/08/2017  . Cervical central spinal stenosis 03/08/2017  . Cervical spondylosis with radiculopathy (Right) (C5) 03/08/2017  . Lumbar spondylosis 03/08/2017  . Atherosclerosis of native arteries of extremity with intermittent claudication ([place]) 02/22/2017  . Gout 02/16/2016  . Ankle pain 11/17/2015  . Arthritis 05/14/2015  . Carotid artery narrowing 05/14/2015  . Claudication ([place]) 05/14/2015  . CAFL (chronic  airflow limitation) ([place]) 05/14/2015  . Clinical depression 05/14/2015  . Acid reflux 05/14/2015  . Urinary incontinence 05/14/2015  . Pins and needles sensation 05/14/2015  . Obstructive apnea 05/14/2015  . Morbid obesity ([place]) 05/14/2015  . History of abnormal cervical Papanicolaou smear 05/14/2015  . Peripheral blood vessel disorder ([place]) 05/14/2015  . B12 deficiency 05/14/2015  . Vitamin D insufficiency 05/14/2015  . AAA (abdominal aortic aneurysm) without rupture ([place]) 05/26/2014  . Aortic heart valve narrowing 01/31/2013  . Malaise and fatigue 09/26/2012  . CAD in native artery 03/17/2009  . Diabetes mellitus, type 2 ([place]) 02/05/2009  . Hypercholesteremia 06/24/2008  . Allergic rhinitis 10/25/2007  . Airway hyperreactivity 10/25/2007  . Smoking greater than 30 pack years 10/25/2007  . Narrowing of intervertebral disc space 10/25/2007  . Benign essential HTN 10/25/2007  . Arthritis, degenerative 10/25/2007    Lieutenant Diego PT, DPT 11:53 AM,02/13/20   [place] MAIN [facility] SERVICES [address] [place], [place], [address] Phone: [phone]   Fax:  [phone]  Name: Laura Nielsen MRN: [id number] Date of Birth: [birth year]

## 2020-02-17 ENCOUNTER — Ambulatory Visit: Payer: Medicare Other | Admitting: Physical Therapy

## 2020-02-17 ENCOUNTER — Encounter: Payer: Self-pay | Admitting: Physical Therapy

## 2020-02-17 ENCOUNTER — Other Ambulatory Visit: Payer: Self-pay

## 2020-02-17 DIAGNOSIS — R262 Difficulty in walking, not elsewhere classified: Secondary | ICD-10-CM | POA: Diagnosis not present

## 2020-02-17 DIAGNOSIS — M6281 Muscle weakness (generalized): Secondary | ICD-10-CM

## 2020-02-17 DIAGNOSIS — R2689 Other abnormalities of gait and mobility: Secondary | ICD-10-CM

## 2020-02-17 NOTE — Therapy (Signed)
[place] MAIN [facility] SERVICES [address] [place], [place], [address] Phone: [phone]   Fax:  [phone]  Physical Therapy Treatment  Patient Details  Name: Laura Nielsen MRN: [id number] Date of Birth: September 08, 1956 Referring Provider (PT): shah Hemang   Encounter Date: 02/17/2020  PT End of Session - 02/17/20 1024    Visit Number  4    Number of Visits  17    Date for PT Re-Evaluation  03/11/20    PT Start Time  1017    PT Stop Time  1100    PT Time Calculation (min)  43 min    Equipment Utilized During Treatment  Gait belt    Activity Tolerance  Patient tolerated treatment well       Past Medical History:  Diagnosis Date  . Allergy   . Arthritis   . Asthma   . Depression   . Diabetes mellitus without complication ([place])   . Emphysema of lung ([place])   . GERD (gastroesophageal reflux disease)   . Hyperlipidemia   . Hypertension   . Osteoporosis   . Tobacco abuse counseling     Past Surgical History:  Procedure Laterality Date  . Cardiac catheterization  3/210   70-80% stenosis RCA stent placed. started on Plavix  . [place]  . TUBAL LIGATION    . VAGINAL HYSTERECTOMY     Menometrorrhagia. Excessive bleeding. Unknown if cervix removed.   . vascular stent  03/28/2011   Dr. Delana Meyer, [facility]; Infrarenal    There were no vitals filed for this visit.  Subjective Assessment - 02/17/20 1023    Subjective  Patient reports increased back pain today likely from wet weather. She reports adherence with HEP with tband exercise with good tolerance; Pt reports using exercise some, 2 days in last week for 3-5 min;    Pertinent History  Patient began having symprtoms of weakness, and poor balance and difficulty finding words 5 years ago. She walks without any device. She has had 1 fall and landed on the bed 1 year ago.    Limitations  Standing;Walking    How long can you stand comfortably?  5 mins    How long can you walk  comfortably?  40 feet    Patient Stated Goals  to walk better    Currently in Pain?  Yes    Pain Score  9     Pain Location  Back    Pain Orientation  Lower    Pain Descriptors / Indicators  Aching;Sore    Pain Type  Chronic pain    Pain Onset  More than a month ago    Pain Frequency  Constant    Aggravating Factors   prolonged positioning    Pain Relieving Factors  heat/rest    Effect of Pain on Daily Activities  decreased activity tolerance;    Multiple Pain Sites  No           TREATMENT: Warm up on Nustep BUE/BLE level 2 x4 min (unbilled); declined moist heat;   Gait around gym x150 feet without AD with cues for erect posture, increase core stabilization and increase step length as tolerated for proper gait mechanics. Patient does exhibit slight flexed posture due to back discomfort. Required supervision for safety;     Seated with 2# ankle weight: -hip flexion march x15 reps bilaterally; -LAQ with ankle DF 2x15 reps bilaterally;  Seated red tband hip abduction/ER x20 reps bilaterally;  Standing with 2# ankle weights:  -hip flexion march x15 reps bilaterally -hip abduction x15 reps bilaterally; -hip extension x15 reps bilaterally -heel/toe raises x15 reps; Required min VCs to improve erect posture for less back discomfort and min VCs to increase AROM for better hip strengthening;   Response to treatment: Pt tolerated session fair, she does fatigue quickly requiring short seated rest breaks. Vitals monitored, HR 77, SPo2 99% with mild shortness of breath noted.  Throughout session she requires min VCs for proper exercise technique, positioning for optimal muscle activation and strengthening;                    PT Education - 02/17/20 1024    Education Details  LE strengthening, exercise technique, HEP    Person(s) Educated  Patient    Methods  Explanation;Verbal cues    Comprehension  Verbalized understanding;Returned demonstration;Verbal cues  required;Need further instruction       PT Short Term Goals - 01/15/20 1428      PT SHORT TERM GOAL #1   Title  Patient will be independent in home exercise program to improve strength/mobility for better functional independence with ADLs.    Time  4    Period  Weeks    Status  New    Target Date  02/12/20      PT SHORT TERM GOAL #2   Title  Patient (> 60 years old) will complete five times sit to stand test in < 15 seconds indicating an increased LE strength and improved balance.    Time  4    Period  Weeks    Status  New    Target Date  02/12/20        PT Long Term Goals - 01/15/20 1429      PT LONG TERM GOAL #1   Title  Patient will increase Berg Balance score by > 6 points to demonstrate decreased fall risk during functional activities    Time  8    Period  Weeks    Status  New    Target Date  03/11/20      PT LONG TERM GOAL #2   Title  Patient will increase six minute walk test distance to >1000 for progression to community ambulator and improve gait ability    Time  8    Period  Weeks    Status  New    Target Date  03/11/20      PT LONG TERM GOAL #3   Title  Patient will increase 10 meter walk test to >1.46m/s as to improve gait speed for better community ambulation and to reduce fall risk.    Time  8    Period  Weeks    Status  New    Target Date  03/11/20            Plan - 02/17/20 1036    Clinical Impression Statement  Patient motivated and participated well within session. Alternated between seated/standing exercise to help reduce back pain and improve exercise tolerance. Patient does require min VCs for proper positioning to help reduce back discomfort. Patient does fatigue with prolonged exercise requiring short rest breaks. Back pain continued to be high throughout session, but was not worse with exercise. She would benefit from additional skilled PT intervention to improve strength, balance and mobility;    Personal Factors and Comorbidities   Comorbidity 1;Comorbidity 2    Comorbidities  diabetes,CHF    Examination-Activity Limitations  Bathing;Locomotion Level;Stand;Bed  Mobility    Examination-Participation Restrictions  Cleaning;Community Activity    Rehab Potential  Fair    PT Frequency  2x / week    PT Duration  8 weeks    PT Treatment/Interventions  Therapeutic activities;Therapeutic exercise;Balance training;Neuromuscular re-education;Patient/family education;Gait training;Moist Heat;Electrical Stimulation;Manual techniques    PT Next Visit Plan  balance and strengthening    Consulted and Agree with Plan of Care  Patient       Patient will benefit from skilled therapeutic intervention in order to improve the following deficits and impairments:  Abnormal gait, Decreased endurance, Decreased range of motion, Decreased strength, Pain, Impaired sensation, Dizziness, Decreased mobility, Difficulty walking, Obesity  Visit Diagnosis: Muscle weakness (generalized)  Difficulty in walking, not elsewhere classified  Other abnormalities of gait and mobility     Problem List Patient Active Problem List   Diagnosis Date Noted  . Aortic atherosclerosis ([place]) 07/17/2019  . Tobacco abuse 01/10/2019  . Unspecified inflammatory spondylopathy, lumbar region ([place]) 10/04/2018  . Abnormal MRI, lumbar spine (03/28/2017) 07/04/2017  . Abnormal MRI, cervical spine (03/28/2017) 07/04/2017  . Lumbar facet syndrome (Bilateral) (L>R) 04/26/2017  . Lumbar facet hypertrophy (multilevel) (Bilateral) 04/26/2017  . Long term current use of anticoagulant therapy 04/26/2017  . Neurogenic pain 04/26/2017  . Musculoskeletal pain 04/26/2017  . Chronic pain syndrome 03/08/2017  . Long term prescription opiate use 03/08/2017  . Opiate use 03/08/2017  . Chronic low back pain (Primary Area of Pain) (Bilateral) (L>R) 03/08/2017  . Chronic neck pain (Secondary area of Pain) (Bilateral) (R>L) 03/08/2017  . Cervicogenic headache (Right) 03/08/2017  .  Chronic upper back pain (Third area of Pain) (Bilateral) (R>L) 03/08/2017  . Chronic hip pain (Bilateral) (L>R) 03/08/2017  . Osteoarthritis of hip (Bilateral) (L>R) 03/08/2017  . Cervical central spinal stenosis 03/08/2017  . Cervical spondylosis with radiculopathy (Right) (C5) 03/08/2017  . Lumbar spondylosis 03/08/2017  . Atherosclerosis of native arteries of extremity with intermittent claudication ([place]) 02/22/2017  . Gout 02/16/2016  . Ankle pain 11/17/2015  . Arthritis 05/14/2015  . Carotid artery narrowing 05/14/2015  . Claudication ([place]) 05/14/2015  . CAFL (chronic airflow limitation) ([place]) 05/14/2015  . Clinical depression 05/14/2015  . Acid reflux 05/14/2015  . Urinary incontinence 05/14/2015  . Pins and needles sensation 05/14/2015  . Obstructive apnea 05/14/2015  . Morbid obesity ([place]) 05/14/2015  . History of abnormal cervical Papanicolaou smear 05/14/2015  . Peripheral blood vessel disorder ([place]) 05/14/2015  . B12 deficiency 05/14/2015  . Vitamin D insufficiency 05/14/2015  . AAA (abdominal aortic aneurysm) without rupture ([place]) 05/26/2014  . Aortic heart valve narrowing 01/31/2013  . Malaise and fatigue 09/26/2012  . CAD in native artery 03/17/2009  . Diabetes mellitus, type 2 ([place]) 02/05/2009  . Hypercholesteremia 06/24/2008  . Allergic rhinitis 10/25/2007  . Airway hyperreactivity 10/25/2007  . Smoking greater than 30 pack years 10/25/2007  . Narrowing of intervertebral disc space 10/25/2007  . Benign essential HTN 10/25/2007  . Arthritis, degenerative 10/25/2007    Rasheedah Reis PT, DPT 02/17/2020, 11:36 AM  [place] MAIN [facility] SERVICES [address] [place], [place], [address] Phone: [phone]   Fax:  [phone]  Name: Laura Nielsen MRN: [id number] Date of Birth: [birth year]

## 2020-02-23 ENCOUNTER — Ambulatory Visit: Payer: Medicare Other | Admitting: Physical Therapy

## 2020-02-23 ENCOUNTER — Encounter: Payer: Self-pay | Admitting: Physical Therapy

## 2020-02-23 ENCOUNTER — Ambulatory Visit: Payer: Federal, State, Local not specified - PPO | Admitting: Physical Therapy

## 2020-02-23 ENCOUNTER — Other Ambulatory Visit: Payer: Self-pay

## 2020-02-23 DIAGNOSIS — E782 Mixed hyperlipidemia: Secondary | ICD-10-CM | POA: Diagnosis not present

## 2020-02-23 DIAGNOSIS — R262 Difficulty in walking, not elsewhere classified: Secondary | ICD-10-CM | POA: Diagnosis not present

## 2020-02-23 DIAGNOSIS — I10 Essential (primary) hypertension: Secondary | ICD-10-CM | POA: Diagnosis not present

## 2020-02-23 DIAGNOSIS — M6281 Muscle weakness (generalized): Secondary | ICD-10-CM

## 2020-02-23 DIAGNOSIS — Z720 Tobacco use: Secondary | ICD-10-CM | POA: Diagnosis not present

## 2020-02-23 DIAGNOSIS — I714 Abdominal aortic aneurysm, without rupture: Secondary | ICD-10-CM | POA: Diagnosis not present

## 2020-02-23 DIAGNOSIS — R2689 Other abnormalities of gait and mobility: Secondary | ICD-10-CM

## 2020-02-23 DIAGNOSIS — I2510 Atherosclerotic heart disease of native coronary artery without angina pectoris: Secondary | ICD-10-CM | POA: Diagnosis not present

## 2020-02-23 NOTE — Therapy (Signed)
[place] MAIN [facility] SERVICES [address] [place], [place], [address] Phone: [phone]   Fax:  [phone]  Physical Therapy Treatment  Patient Details  Name: Laura Nielsen MRN: [id number] Date of Birth: Mar 16, 1956 Referring Provider (PT): shah Hemang   Encounter Date: 02/23/2020  PT End of Session - 02/23/20 1151    Visit Number  5    Number of Visits  17    Date for PT Re-Evaluation  03/11/20    PT Start Time  2778    PT Stop Time  1225    PT Time Calculation (min)  40 min    Equipment Utilized During Treatment  Gait belt    Activity Tolerance  Patient tolerated treatment well    Behavior During Therapy  WFL for tasks assessed/performed       Past Medical History:  Diagnosis Date  . Allergy   . Arthritis   . Asthma   . Depression   . Diabetes mellitus without complication ([place])   . Emphysema of lung ([place])   . GERD (gastroesophageal reflux disease)   . Hyperlipidemia   . Hypertension   . Osteoporosis   . Tobacco abuse counseling     Past Surgical History:  Procedure Laterality Date  . Cardiac catheterization  3/210   70-80% stenosis RCA stent placed. started on Plavix  . [place]  . TUBAL LIGATION    . VAGINAL HYSTERECTOMY     Menometrorrhagia. Excessive bleeding. Unknown if cervix removed.   . vascular stent  03/28/2011   Dr. Delana Meyer, [facility]; Infrarenal    There were no vitals filed for this visit.  Subjective Assessment - 02/23/20 1149    Subjective  Patient reports increased back pain today.. She reports adherence with HEP with tband exercise with good tolerance; Pt reports using exercise some, 2 days in last week for 3-5 min;    Pertinent History  Patient began having symprtoms of weakness, and poor balance and difficulty finding words 5 years ago. She walks without any device. She has had 1 fall and landed on the bed 1 year ago.    Limitations  Standing;Walking    How long can you sit comfortably?   unlimited    How long can you stand comfortably?  5 mins    How long can you walk comfortably?  40 feet    Patient Stated Goals  to walk better    Currently in Pain?  Yes    Pain Score  6     Pain Location  Back    Pain Orientation  Medial    Pain Descriptors / Indicators  Aching    Pain Type  Chronic pain    Pain Onset  More than a month ago    Aggravating Factors   walking    Pain Relieving Factors  heat and rest    Effect of Pain on Daily Activities  difficult with standing activities    Multiple Pain Sites  No         Treatment: Neuromuscular training: side stepping left and right in parallel bars 10 feet x 3 standing on blue foam with head turns x 1 min  Standing feet together on blue foam with head turns x 1 min step ups from floor to 6 inch stool x 20 bilateral tapping from blue foam to 6 inch stool x 20 BLE sit to stand x 10 marching in parallel bars x 20 Patient needs seated rest due  to back pain that gets aggravated with standing on foam  Pt educated throughout session about proper posture and technique with exercises. Improved exercise technique, movement at target joints, use of target muscles after min to mod verbal, visual, tactile cues. CGA and Min to mod verbal cues used throughout with increased in postural sway and LOB most seen with narrow base of support and while on uneven surfaces. Continues to have balance deficits typical with diagnosis.                       PT Education - 02/23/20 1151    Education Details  HEP    Person(s) Educated  Patient    Methods  Explanation;Demonstration    Comprehension  Verbalized understanding;Returned demonstration;Need further instruction       PT Short Term Goals - 01/15/20 1428      PT SHORT TERM GOAL #1   Title  Patient will be independent in home exercise program to improve strength/mobility for better functional independence with ADLs.    Time  4    Period  Weeks    Status  New    Target  Date  02/12/20      PT SHORT TERM GOAL #2   Title  Patient (> 79 years old) will complete five times sit to stand test in < 15 seconds indicating an increased LE strength and improved balance.    Time  4    Period  Weeks    Status  New    Target Date  02/12/20        PT Long Term Goals - 01/15/20 1429      PT LONG TERM GOAL #1   Title  Patient will increase Berg Balance score by > 6 points to demonstrate decreased fall risk during functional activities    Time  8    Period  Weeks    Status  New    Target Date  03/11/20      PT LONG TERM GOAL #2   Title  Patient will increase six minute walk test distance to >1000 for progression to community ambulator and improve gait ability    Time  8    Period  Weeks    Status  New    Target Date  03/11/20      PT LONG TERM GOAL #3   Title  Patient will increase 10 meter walk test to >1.52m/s as to improve gait speed for better community ambulation and to reduce fall risk.    Time  8    Period  Weeks    Status  New    Target Date  03/11/20            Plan - 02/23/20 1151    Clinical Impression Statement  Patient instructed in beginning balance and coordination exercise. Patient required mod VCs and min A for gait to improve weight shift and postural control. Patient requires min VCs to improve ankle stability with gait.  Patients would benefit from additional skilled PT intervention to improve balance/gait safety and reduce fall risk.   Personal Factors and Comorbidities  Comorbidity 1;Comorbidity 2    Comorbidities  diabetes,CHF    Examination-Activity Limitations  Bathing;Locomotion Level;Stand;Bed Mobility    Examination-Participation Restrictions  Cleaning;Community Activity    Rehab Potential  Fair    PT Frequency  2x / week    PT Duration  8 weeks    PT Treatment/Interventions  Therapeutic activities;Therapeutic exercise;Balance training;Neuromuscular re-education;Patient/family  education;Gait training;Moist Heat;Electrical  Stimulation;Manual techniques    PT Next Visit Plan  balance and strengthening    Consulted and Agree with Plan of Care  Patient       Patient will benefit from skilled therapeutic intervention in order to improve the following deficits and impairments:  Abnormal gait, Decreased endurance, Decreased range of motion, Decreased strength, Pain, Impaired sensation, Dizziness, Decreased mobility, Difficulty walking, Obesity  Visit Diagnosis: Muscle weakness (generalized)  Difficulty in walking, not elsewhere classified  Other abnormalities of gait and mobility     Problem List Patient Active Problem List   Diagnosis Date Noted  . Aortic atherosclerosis ([place]) 07/17/2019  . Tobacco abuse 01/10/2019  . Unspecified inflammatory spondylopathy, lumbar region ([place]) 10/04/2018  . Abnormal MRI, lumbar spine (03/28/2017) 07/04/2017  . Abnormal MRI, cervical spine (03/28/2017) 07/04/2017  . Lumbar facet syndrome (Bilateral) (L>R) 04/26/2017  . Lumbar facet hypertrophy (multilevel) (Bilateral) 04/26/2017  . Long term current use of anticoagulant therapy 04/26/2017  . Neurogenic pain 04/26/2017  . Musculoskeletal pain 04/26/2017  . Chronic pain syndrome 03/08/2017  . Long term prescription opiate use 03/08/2017  . Opiate use 03/08/2017  . Chronic low back pain (Primary Area of Pain) (Bilateral) (L>R) 03/08/2017  . Chronic neck pain (Secondary area of Pain) (Bilateral) (R>L) 03/08/2017  . Cervicogenic headache (Right) 03/08/2017  . Chronic upper back pain (Third area of Pain) (Bilateral) (R>L) 03/08/2017  . Chronic hip pain (Bilateral) (L>R) 03/08/2017  . Osteoarthritis of hip (Bilateral) (L>R) 03/08/2017  . Cervical central spinal stenosis 03/08/2017  . Cervical spondylosis with radiculopathy (Right) (C5) 03/08/2017  . Lumbar spondylosis 03/08/2017  . Atherosclerosis of native arteries of extremity with intermittent claudication ([place]) 02/22/2017  . Gout 02/16/2016  . Ankle pain 11/17/2015   . Arthritis 05/14/2015  . Carotid artery narrowing 05/14/2015  . Claudication ([place]) 05/14/2015  . CAFL (chronic airflow limitation) ([place]) 05/14/2015  . Clinical depression 05/14/2015  . Acid reflux 05/14/2015  . Urinary incontinence 05/14/2015  . Pins and needles sensation 05/14/2015  . Obstructive apnea 05/14/2015  . Morbid obesity ([place]) 05/14/2015  . History of abnormal cervical Papanicolaou smear 05/14/2015  . Peripheral blood vessel disorder ([place]) 05/14/2015  . B12 deficiency 05/14/2015  . Vitamin D insufficiency 05/14/2015  . AAA (abdominal aortic aneurysm) without rupture ([place]) 05/26/2014  . Aortic heart valve narrowing 01/31/2013  . Malaise and fatigue 09/26/2012  . CAD in native artery 03/17/2009  . Diabetes mellitus, type 2 ([place]) 02/05/2009  . Hypercholesteremia 06/24/2008  . Allergic rhinitis 10/25/2007  . Airway hyperreactivity 10/25/2007  . Smoking greater than 30 pack years 10/25/2007  . Narrowing of intervertebral disc space 10/25/2007  . Benign essential HTN 10/25/2007  . Arthritis, degenerative 10/25/2007    Alanson Puls, PT DPT 02/23/2020, 11:53 AM  [place] MAIN [facility] SERVICES [address] [place], [place], [address] Phone: [phone]   Fax:  [phone]  Name: Laura Nielsen MRN: [id number] Date of Birth: 20-Mar-1956

## 2020-02-25 ENCOUNTER — Ambulatory Visit: Payer: Medicare Other | Admitting: Physical Therapy

## 2020-03-01 ENCOUNTER — Ambulatory Visit: Payer: Medicare Other | Admitting: Physical Therapy

## 2020-03-01 ENCOUNTER — Other Ambulatory Visit: Payer: Self-pay

## 2020-03-01 DIAGNOSIS — R2689 Other abnormalities of gait and mobility: Secondary | ICD-10-CM

## 2020-03-01 DIAGNOSIS — R262 Difficulty in walking, not elsewhere classified: Secondary | ICD-10-CM

## 2020-03-01 DIAGNOSIS — M6281 Muscle weakness (generalized): Secondary | ICD-10-CM

## 2020-03-01 NOTE — Therapy (Signed)
[place] MAIN [facility] SERVICES [address] [place], [place], [address] Phone: [phone]   Fax:  [phone]  Physical Therapy Treatment  Patient Details  Name: Laura Nielsen MRN: [id number] Date of Birth: [birth year] Referring Provider (PT): shah Hemang   Encounter Date: 03/01/2020  PT End of Session - 03/01/20 1005    Visit Number  6    Number of Visits  17    Date for PT Re-Evaluation  03/11/20    PT Start Time  0930    PT Stop Time  1010    PT Time Calculation (min)  40 min    Equipment Utilized During Treatment  Gait belt    Activity Tolerance  Patient tolerated treatment well    Behavior During Therapy  WFL for tasks assessed/performed       Past Medical History:  Diagnosis Date  . Allergy   . Arthritis   . Asthma   . Depression   . Diabetes mellitus without complication ([place])   . Emphysema of lung ([place])   . GERD (gastroesophageal reflux disease)   . Hyperlipidemia   . Hypertension   . Osteoporosis   . Tobacco abuse counseling     Past Surgical History:  Procedure Laterality Date  . Cardiac catheterization  3/210   70-80% stenosis RCA stent placed. started on Plavix  . [place]  . TUBAL LIGATION    . VAGINAL HYSTERECTOMY     Menometrorrhagia. Excessive bleeding. Unknown if cervix removed.   . vascular stent  03/28/2011   Dr. Delana Meyer, [facility]; Infrarenal    There were no vitals filed for this visit.  Subjective Assessment - 03/01/20 1003    Subjective  Patient reports increased back pain today.. She reports adherence with HEP with tband exercise with good tolerance; Pt reports using exercise some, 2 days in last week for 3-5 min;    Pertinent History  Patient began having symprtoms of weakness, and poor balance and difficulty finding words 5 years ago. She walks without any device. She has had 1 fall and landed on the bed 1 year ago.    Limitations  Standing;Walking    How long can you sit comfortably?   unlimited    How long can you stand comfortably?  5 mins    How long can you walk comfortably?  40 feet    Patient Stated Goals  to walk better    Currently in Pain?  Yes    Pain Score  6     Pain Location  Back    Pain Orientation  Left    Pain Descriptors / Indicators  Aching;Shooting    Pain Type  Chronic pain    Pain Onset  More than a month ago    Pain Frequency  Intermittent    Aggravating Factors   nothing    Pain Relieving Factors  nothing    Effect of Pain on Daily Activities  difficult to do activities    Multiple Pain Sites  No       Ther-ex  Nu-step x 5 mins  Hip flexion marches  x 10 bilateral; Hip abduction  x 10 bilateral; HS curls  x 10 bilateral; Hip extension  x 10 bilateral; Quantum leg press 40 # x 20 x 3 sets  Heel raises x 15 x 2 sets Step ups to 6-inch stool x 20   Patient needs occasional verbal cueing to improve posture and cueing to correctly perform exercises  slowly, holding at end of range to increase motor firing of desired muscle to encourage fatigue.                         PT Education - 03/01/20 1004    Education Details  HEP    Person(s) Educated  Patient    Methods  Explanation    Comprehension  Verbalized understanding       PT Short Term Goals - 01/15/20 1428      PT SHORT TERM GOAL #1   Title  Patient will be independent in home exercise program to improve strength/mobility for better functional independence with ADLs.    Time  4    Period  Weeks    Status  New    Target Date  02/12/20      PT SHORT TERM GOAL #2   Title  Patient (64 years old) will complete five times sit to stand test in < 15 seconds indicating an increased LE strength and improved balance.    Time  4    Period  Weeks    Status  New    Target Date  02/12/20        PT Long Term Goals - 01/15/20 1429      PT LONG TERM GOAL #1   Title  Patient will increase Berg Balance score by > 6 points to demonstrate decreased fall risk  during functional activities    Time  8    Period  Weeks    Status  New    Target Date  03/11/20      PT LONG TERM GOAL #2   Title  Patient will increase six minute walk test distance to >1000 for progression to community ambulator and improve gait ability    Time  8    Period  Weeks    Status  New    Target Date  03/11/20      PT LONG TERM GOAL #3   Title  Patient will increase 10 meter walk test to >1.15m/s as to improve gait speed for better community ambulation and to reduce fall risk.    Time  8    Period  Weeks    Status  New    Target Date  03/11/20            Plan - 03/01/20 1005    Clinical Impression Statement  Patient instructed in beginning BLE strengthening and exercise. Patient required mod VCS for correct technique. She has multiple co-morbities and pain that are contributing to difficulties with tolerating her PT treatment. Patient would benefit from additional skilled PT intervention to improve strength, balance and gait safety.   Personal Factors and Comorbidities  Comorbidity 1;Comorbidity 2    Comorbidities  diabetes,CHF    Examination-Activity Limitations  Bathing;Locomotion Level;Stand;Bed Mobility    Examination-Participation Restrictions  Cleaning;Community Activity    Rehab Potential  Fair    PT Frequency  2x / week    PT Duration  8 weeks    PT Treatment/Interventions  Therapeutic activities;Therapeutic exercise;Balance training;Neuromuscular re-education;Patient/family education;Gait training;Moist Heat;Electrical Stimulation;Manual techniques    PT Next Visit Plan  balance and strengthening    Consulted and Agree with Plan of Care  Patient       Patient will benefit from skilled therapeutic intervention in order to improve the following deficits and impairments:  Abnormal gait, Decreased endurance, Decreased range of motion, Decreased strength, Pain, Impaired sensation, Dizziness, Decreased mobility,  Difficulty walking, Obesity  Visit  Diagnosis: Muscle weakness (generalized)  Difficulty in walking, not elsewhere classified  Other abnormalities of gait and mobility     Problem List Patient Active Problem List   Diagnosis Date Noted  . Aortic atherosclerosis ([place]) 07/17/2019  . Tobacco abuse 01/10/2019  . Unspecified inflammatory spondylopathy, lumbar region ([place]) 10/04/2018  . Abnormal MRI, lumbar spine (03/28/2017) 07/04/2017  . Abnormal MRI, cervical spine (03/28/2017) 07/04/2017  . Lumbar facet syndrome (Bilateral) (L>R) 04/26/2017  . Lumbar facet hypertrophy (multilevel) (Bilateral) 04/26/2017  . Long term current use of anticoagulant therapy 04/26/2017  . Neurogenic pain 04/26/2017  . Musculoskeletal pain 04/26/2017  . Chronic pain syndrome 03/08/2017  . Long term prescription opiate use 03/08/2017  . Opiate use 03/08/2017  . Chronic low back pain (Primary Area of Pain) (Bilateral) (L>R) 03/08/2017  . Chronic neck pain (Secondary area of Pain) (Bilateral) (R>L) 03/08/2017  . Cervicogenic headache (Right) 03/08/2017  . Chronic upper back pain (Third area of Pain) (Bilateral) (R>L) 03/08/2017  . Chronic hip pain (Bilateral) (L>R) 03/08/2017  . Osteoarthritis of hip (Bilateral) (L>R) 03/08/2017  . Cervical central spinal stenosis 03/08/2017  . Cervical spondylosis with radiculopathy (Right) (C5) 03/08/2017  . Lumbar spondylosis 03/08/2017  . Atherosclerosis of native arteries of extremity with intermittent claudication ([place]) 02/22/2017  . Gout 02/16/2016  . Ankle pain 11/17/2015  . Arthritis 05/14/2015  . Carotid artery narrowing 05/14/2015  . Claudication ([place]) 05/14/2015  . CAFL (chronic airflow limitation) ([place]) 05/14/2015  . Clinical depression 05/14/2015  . Acid reflux 05/14/2015  . Urinary incontinence 05/14/2015  . Pins and needles sensation 05/14/2015  . Obstructive apnea 05/14/2015  . Morbid obesity ([place]) 05/14/2015  . History of abnormal cervical Papanicolaou smear 05/14/2015  .  Peripheral blood vessel disorder ([place]) 05/14/2015  . B12 deficiency 05/14/2015  . Vitamin D insufficiency 05/14/2015  . AAA (abdominal aortic aneurysm) without rupture ([place]) 05/26/2014  . Aortic heart valve narrowing 01/31/2013  . Malaise and fatigue 09/26/2012  . CAD in native artery 03/17/2009  . Diabetes mellitus, type 2 ([place]) 02/05/2009  . Hypercholesteremia 06/24/2008  . Allergic rhinitis 10/25/2007  . Airway hyperreactivity 10/25/2007  . Smoking greater than 30 pack years 10/25/2007  . Narrowing of intervertebral disc space 10/25/2007  . Benign essential HTN 10/25/2007  . Arthritis, degenerative 10/25/2007    Alanson Puls, PT DPT 03/01/2020, 10:13 AM  [place] MAIN [facility] SERVICES [address] [place], [place], [address] Phone: [phone]   Fax:  [phone]  Name: Carlena Ruybal MRN: [id number] Date of Birth: 09-10-1956

## 2020-03-03 ENCOUNTER — Ambulatory Visit: Payer: Medicare Other | Admitting: Physical Therapy

## 2020-03-03 ENCOUNTER — Encounter: Payer: Self-pay | Admitting: Physical Therapy

## 2020-03-03 ENCOUNTER — Other Ambulatory Visit: Payer: Self-pay

## 2020-03-03 DIAGNOSIS — M6281 Muscle weakness (generalized): Secondary | ICD-10-CM | POA: Diagnosis not present

## 2020-03-03 DIAGNOSIS — R2689 Other abnormalities of gait and mobility: Secondary | ICD-10-CM | POA: Diagnosis not present

## 2020-03-03 DIAGNOSIS — R262 Difficulty in walking, not elsewhere classified: Secondary | ICD-10-CM

## 2020-03-03 NOTE — Therapy (Signed)
[place] MAIN [facility] SERVICES [address] [place], [place], [address] Phone: [phone]   Fax:  [phone]  Physical Therapy Treatment  Patient Details  Name: Laura Nielsen MRN: [id number] Date of Birth: [birth year] Referring Provider (PT): shah Hemang   Encounter Date: 03/03/2020  PT End of Session - 03/03/20 1309    Visit Number  7    Number of Visits  17    Date for PT Re-Evaluation  03/11/20    PT Start Time  0100    PT Stop Time  0140    PT Time Calculation (min)  40 min    Equipment Utilized During Treatment  Gait belt    Activity Tolerance  Patient limited by pain    Behavior During Therapy  WFL for tasks assessed/performed       Past Medical History:  Diagnosis Date  . Allergy   . Arthritis   . Asthma   . Depression   . Diabetes mellitus without complication ([place])   . Emphysema of lung ([place])   . GERD (gastroesophageal reflux disease)   . Hyperlipidemia   . Hypertension   . Osteoporosis   . Tobacco abuse counseling     Past Surgical History:  Procedure Laterality Date  . Cardiac catheterization  3/210   70-80% stenosis RCA stent placed. started on Plavix  . [place]  . TUBAL LIGATION    . VAGINAL HYSTERECTOMY     Menometrorrhagia. Excessive bleeding. Unknown if cervix removed.   . vascular stent  03/28/2011   Dr. Delana Meyer, [facility]; Infrarenal    There were no vitals filed for this visit.  Subjective Assessment - 03/03/20 1308    Subjective  Patient reports increased body pain today.. She reports adherence with HEP with tband exercise with good tolerance; Pt reports using exercise some, 2 days in last week for 3-5 min;    Pertinent History  Patient began having symprtoms of weakness, and poor balance and difficulty finding words 5 years ago. She walks without any device. She has had 1 fall and landed on the bed 1 year ago.    Limitations  Standing;Walking    How long can you sit comfortably?   unlimited    How long can you stand comfortably?  5 mins    How long can you walk comfortably?  40 feet    Patient Stated Goals  to walk better    Currently in Pain?  Yes    Pain Score  5     Pain Location  Back    Pain Orientation  Left    Pain Descriptors / Indicators  Aching    Pain Onset  More than a month ago       Therapeutic exercise: Nu-step  x 5 mins L 4  Supine: SLR x 15 BLE Hookling marching x 15  Hooklying abd/ER x 15  Bridging x 15 SAQ x 15 BLE Hip abd/add x 15, BLE Heel slides x 15, BLE hooklying trunk rotation x 15 x 2   .Pt educated throughout session about proper posture and technique with exercises. Improved exercise technique, movement at target joints, use of target muscles after min to mod verbal, visual, tactile cues.                          PT Education - 03/03/20 1309    Education Details  HEP    Person(s) Educated  Patient  Methods  Explanation;Demonstration    Comprehension  Verbalized understanding;Returned demonstration       PT Short Term Goals - 01/15/20 1428      PT SHORT TERM GOAL #1   Title  Patient will be independent in home exercise program to improve strength/mobility for better functional independence with ADLs.    Time  4    Period  Weeks    Status  New    Target Date  02/12/20      PT SHORT TERM GOAL #2   Title  Patient (> 50 years old) will complete five times sit to stand test in < 15 seconds indicating an increased LE strength and improved balance.    Time  4    Period  Weeks    Status  New    Target Date  02/12/20        PT Long Term Goals - 01/15/20 1429      PT LONG TERM GOAL #1   Title  Patient will increase Berg Balance score by > 6 points to demonstrate decreased fall risk during functional activities    Time  8    Period  Weeks    Status  New    Target Date  03/11/20      PT LONG TERM GOAL #2   Title  Patient will increase six minute walk test distance to >1000 for progression  to community ambulator and improve gait ability    Time  8    Period  Weeks    Status  New    Target Date  03/11/20      PT LONG TERM GOAL #3   Title  Patient will increase 10 meter walk test to >1.39m/s as to improve gait speed for better community ambulation and to reduce fall risk.    Time  8    Period  Weeks    Status  New    Target Date  03/11/20            Plan - 03/03/20 1311    Clinical Impression Statement   Pt was able to perform all exercises today with CGA.Marland Kitchen Pt was able to perform strength exercises, demonstrating improvements in LE strength and stability.    Pt requires verbal, visual and tactile cues during exercise in order to complete tasks with proper form and technique.  Pt would continue to benefit from skilled PT services in order to further strengthen LE's, improve static and dynamic balance, and improve coordination in order to increase functional mobility and decrease risk of falls   Personal Factors and Comorbidities  Comorbidity 1;Comorbidity 2    Comorbidities  diabetes,CHF    Examination-Activity Limitations  Bathing;Locomotion Level;Stand;Bed Mobility    Examination-Participation Restrictions  Cleaning;Community Activity    Rehab Potential  Fair    PT Frequency  2x / week    PT Duration  8 weeks    PT Treatment/Interventions  Therapeutic activities;Therapeutic exercise;Balance training;Neuromuscular re-education;Patient/family education;Gait training;Moist Heat;Electrical Stimulation;Manual techniques    PT Next Visit Plan  balance and strengthening    Consulted and Agree with Plan of Care  Patient       Patient will benefit from skilled therapeutic intervention in order to improve the following deficits and impairments:  Abnormal gait, Decreased endurance, Decreased range of motion, Decreased strength, Pain, Impaired sensation, Dizziness, Decreased mobility, Difficulty walking, Obesity  Visit Diagnosis: Muscle weakness (generalized)  Difficulty in  walking, not elsewhere classified  Other abnormalities of gait and mobility  Problem List Patient Active Problem List   Diagnosis Date Noted  . Aortic atherosclerosis ([place]) 07/17/2019  . Tobacco abuse 01/10/2019  . Unspecified inflammatory spondylopathy, lumbar region ([place]) 10/04/2018  . Abnormal MRI, lumbar spine (03/28/2017) 07/04/2017  . Abnormal MRI, cervical spine (03/28/2017) 07/04/2017  . Lumbar facet syndrome (Bilateral) (L>R) 04/26/2017  . Lumbar facet hypertrophy (multilevel) (Bilateral) 04/26/2017  . Long term current use of anticoagulant therapy 04/26/2017  . Neurogenic pain 04/26/2017  . Musculoskeletal pain 04/26/2017  . Chronic pain syndrome 03/08/2017  . Long term prescription opiate use 03/08/2017  . Opiate use 03/08/2017  . Chronic low back pain (Primary Area of Pain) (Bilateral) (L>R) 03/08/2017  . Chronic neck pain (Secondary area of Pain) (Bilateral) (R>L) 03/08/2017  . Cervicogenic headache (Right) 03/08/2017  . Chronic upper back pain (Third area of Pain) (Bilateral) (R>L) 03/08/2017  . Chronic hip pain (Bilateral) (L>R) 03/08/2017  . Osteoarthritis of hip (Bilateral) (L>R) 03/08/2017  . Cervical central spinal stenosis 03/08/2017  . Cervical spondylosis with radiculopathy (Right) (C5) 03/08/2017  . Lumbar spondylosis 03/08/2017  . Atherosclerosis of native arteries of extremity with intermittent claudication ([place]) 02/22/2017  . Gout 02/16/2016  . Ankle pain 11/17/2015  . Arthritis 05/14/2015  . Carotid artery narrowing 05/14/2015  . Claudication ([place]) 05/14/2015  . CAFL (chronic airflow limitation) ([place]) 05/14/2015  . Clinical depression 05/14/2015  . Acid reflux 05/14/2015  . Urinary incontinence 05/14/2015  . Pins and needles sensation 05/14/2015  . Obstructive apnea 05/14/2015  . Morbid obesity ([place]) 05/14/2015  . History of abnormal cervical Papanicolaou smear 05/14/2015  . Peripheral blood vessel disorder ([place]) 05/14/2015  . B12  deficiency 05/14/2015  . Vitamin D insufficiency 05/14/2015  . AAA (abdominal aortic aneurysm) without rupture ([place]) 05/26/2014  . Aortic heart valve narrowing 01/31/2013  . Malaise and fatigue 09/26/2012  . CAD in native artery 03/17/2009  . Diabetes mellitus, type 2 ([place]) 02/05/2009  . Hypercholesteremia 06/24/2008  . Allergic rhinitis 10/25/2007  . Airway hyperreactivity 10/25/2007  . Smoking greater than 30 pack years 10/25/2007  . Narrowing of intervertebral disc space 10/25/2007  . Benign essential HTN 10/25/2007  . Arthritis, degenerative 10/25/2007    Alanson Puls, PT DPT 03/03/2020, 1:12 PM  [place] MAIN [facility] SERVICES [address] [place], [place], [address] Phone: [phone]   Fax:  [phone]  Name: Khadijah Mastrianni MRN: [id number] Date of Birth: 1956-02-15

## 2020-03-04 DIAGNOSIS — R2689 Other abnormalities of gait and mobility: Secondary | ICD-10-CM | POA: Diagnosis not present

## 2020-03-09 ENCOUNTER — Encounter: Payer: Self-pay | Admitting: Pain Medicine

## 2020-03-09 ENCOUNTER — Ambulatory Visit: Payer: Medicare Other | Attending: Neurology | Admitting: Physical Therapy

## 2020-03-09 ENCOUNTER — Other Ambulatory Visit: Payer: Self-pay

## 2020-03-09 ENCOUNTER — Encounter: Payer: Self-pay | Admitting: Physical Therapy

## 2020-03-09 DIAGNOSIS — R2689 Other abnormalities of gait and mobility: Secondary | ICD-10-CM | POA: Diagnosis not present

## 2020-03-09 DIAGNOSIS — R262 Difficulty in walking, not elsewhere classified: Secondary | ICD-10-CM | POA: Diagnosis not present

## 2020-03-09 DIAGNOSIS — M6281 Muscle weakness (generalized): Secondary | ICD-10-CM

## 2020-03-09 NOTE — Therapy (Signed)
[place] MAIN [facility] SERVICES [address] [place], [place], [address] Phone: [phone]   Fax:  [phone]  Physical Therapy Treatment  Patient Details  Name: Laura Nielsen MRN: [id number] Date of Birth: 09/10/1956 Referring Provider (PT): shah Hemang   Encounter Date: 03/09/2020  PT End of Session - 03/09/20 1152    Visit Number  8    Number of Visits  17    Date for PT Re-Evaluation  03/11/20    PT Start Time  1147    PT Stop Time  1230    PT Time Calculation (min)  43 min    Equipment Utilized During Treatment  Gait belt    Activity Tolerance  Patient limited by pain    Behavior During Therapy  [facility] for tasks assessed/performed       Past Medical History:  Diagnosis Date  . Allergy   . Arthritis   . Asthma   . Depression   . Diabetes mellitus without complication ([place])   . Emphysema of lung ([place])   . GERD (gastroesophageal reflux disease)   . Hyperlipidemia   . Hypertension   . Osteoporosis   . Tobacco abuse counseling     Past Surgical History:  Procedure Laterality Date  . Cardiac catheterization  3/210   70-80% stenosis RCA stent placed. started on Plavix  . [place]  . TUBAL LIGATION    . VAGINAL HYSTERECTOMY     Menometrorrhagia. Excessive bleeding. Unknown if cervix removed.   . vascular stent  03/28/2011   Dr. Delana Meyer, [facility]; Infrarenal    There were no vitals filed for this visit.  Subjective Assessment - 03/09/20 1150    Subjective  Patient reports continued low back discomfort and having a headache today from allergies. She reports using exercise bike 2 days for about 5 min each in the last week;    Pertinent History  Patient began having symprtoms of weakness, and poor balance and difficulty finding words 5 years ago. She walks without any device. She has had 1 fall and landed on the bed 1 year ago.    Limitations  Standing;Walking    How long can you sit comfortably?  unlimited    How  long can you stand comfortably?  5 mins    How long can you walk comfortably?  40 feet    Patient Stated Goals  to walk better    Currently in Pain?  Yes    Pain Score  5     Pain Location  Back    Pain Descriptors / Indicators  Aching    Pain Type  Chronic pain    Pain Onset  More than a month ago    Pain Frequency  Intermittent    Aggravating Factors   unsure    Pain Relieving Factors  rest/heat    Effect of Pain on Daily Activities  decreased activity tolerance;    Multiple Pain Sites  No         TREATMENT:   Therapeutic exercise: Warm up on Crosstrainer level 2 x4 min (Unbilled);   Hooklying: Lumbar trunk rotation x10 reps each direction to help improve flexibility and reduce back discomfort; Required min VCs to avoid excessive ROM for better tolerance;  Green tband around BLE:  Hookling marching x20 Hooklying abd/ER x 20  Hooklying bridges x20 reps;  Patient sidelying:  Clamshells green tband 2x15 with min VCs to avoid trunk rotation to isolate hip abductor strengthening;  NMR:  Sit<>stand with arms across chest x10 reps with cues for forward weight shift to improve transfer ability;   Weaving around cones #5 x2 laps with CGA for safety and cues to increase step length for better gait safety; Picking up cones #5 with supervision with good safety noted;  Side stepping on firm surface x10 feet x1 lap each direction, unsupported to challenge dynamic balance;   Marland KitchenPt educated throughout session about proper posture and technique with exercises. Improved exercise technique, movement at target joints, use of target muscles after min to mod verbal, visual, tactile cues.Patient tolerated session fair. She does fatigue quickly requiring short rest breaks. Patient instructed in advanced balance exercise to challenge gait safety. She did require CGA when weaving around cones and did have some unsteadiness with turns.                  PT Education - 03/09/20  1151    Education Details  HEP/strengthening;    Person(s) Educated  Patient    Methods  Explanation;Verbal cues    Comprehension  Verbalized understanding;Returned demonstration;Verbal cues required;Need further instruction       PT Short Term Goals - 01/15/20 1428      PT SHORT TERM GOAL #1   Title  Patient will be independent in home exercise program to improve strength/mobility for better functional independence with ADLs.    Time  4    Period  Weeks    Status  New    Target Date  02/12/20      PT SHORT TERM GOAL #2   Title  Patient (> 14 years old) will complete five times sit to stand test in < 15 seconds indicating an increased LE strength and improved balance.    Time  4    Period  Weeks    Status  New    Target Date  02/12/20        PT Long Term Goals - 01/15/20 1429      PT LONG TERM GOAL #1   Title  Patient will increase Berg Balance score by > 6 points to demonstrate decreased fall risk during functional activities    Time  8    Period  Weeks    Status  New    Target Date  03/11/20      PT LONG TERM GOAL #2   Title  Patient will increase six minute walk test distance to >1000 for progression to community ambulator and improve gait ability    Time  8    Period  Weeks    Status  New    Target Date  03/11/20      PT LONG TERM GOAL #3   Title  Patient will increase 10 meter walk test to >1.57m/s as to improve gait speed for better community ambulation and to reduce fall risk.    Time  8    Period  Weeks    Status  New    Target Date  03/11/20            Plan - 03/09/20 1203    Clinical Impression Statement  Patient motivated and participated well within session. She does require min VCs for proper exercise technique and positioning. Patient instructed in hooklying exercise to help reduce back discomfort. Progressed LE strengthening with increased repetition. Patient instructed in advanced balance exercise to challenge gait safety. She did require CGA  when weaving around cones and did have some unsteadiness with turns.She would benefit  from additional skilled PT intervention to improve strength, balance and mobility;    Personal Factors and Comorbidities  Comorbidity 1;Comorbidity 2    Comorbidities  diabetes,CHF    Examination-Activity Limitations  Bathing;Locomotion Level;Stand;Bed Mobility    Examination-Participation Restrictions  Cleaning;Community Activity    Rehab Potential  Fair    PT Frequency  2x / week    PT Duration  8 weeks    PT Treatment/Interventions  Therapeutic activities;Therapeutic exercise;Balance training;Neuromuscular re-education;Patient/family education;Gait training;Moist Heat;Electrical Stimulation;Manual techniques    PT Next Visit Plan  balance and strengthening    Consulted and Agree with Plan of Care  Patient       Patient will benefit from skilled therapeutic intervention in order to improve the following deficits and impairments:  Abnormal gait, Decreased endurance, Decreased range of motion, Decreased strength, Pain, Impaired sensation, Dizziness, Decreased mobility, Difficulty walking, Obesity  Visit Diagnosis: Muscle weakness (generalized)  Difficulty in walking, not elsewhere classified  Other abnormalities of gait and mobility     Problem List Patient Active Problem List   Diagnosis Date Noted  . Aortic atherosclerosis ([place]) 07/17/2019  . Tobacco abuse 01/10/2019  . Unspecified inflammatory spondylopathy, lumbar region ([place]) 10/04/2018  . Abnormal MRI, lumbar spine (03/28/2017) 07/04/2017  . Abnormal MRI, cervical spine (03/28/2017) 07/04/2017  . Lumbar facet syndrome (Bilateral) (L>R) 04/26/2017  . Lumbar facet hypertrophy (multilevel) (Bilateral) 04/26/2017  . Long term current use of anticoagulant therapy 04/26/2017  . Neurogenic pain 04/26/2017  . Musculoskeletal pain 04/26/2017  . Chronic pain syndrome 03/08/2017  . Long term prescription opiate use 03/08/2017  . Opiate use  03/08/2017  . Chronic low back pain (Primary Area of Pain) (Bilateral) (L>R) 03/08/2017  . Chronic neck pain (Secondary area of Pain) (Bilateral) (R>L) 03/08/2017  . Cervicogenic headache (Right) 03/08/2017  . Chronic upper back pain (Third area of Pain) (Bilateral) (R>L) 03/08/2017  . Chronic hip pain (Bilateral) (L>R) 03/08/2017  . Osteoarthritis of hip (Bilateral) (L>R) 03/08/2017  . Cervical central spinal stenosis 03/08/2017  . Cervical spondylosis with radiculopathy (Right) (C5) 03/08/2017  . Lumbar spondylosis 03/08/2017  . Atherosclerosis of native arteries of extremity with intermittent claudication ([place]) 02/22/2017  . Gout 02/16/2016  . Ankle pain 11/17/2015  . Arthritis 05/14/2015  . Carotid artery narrowing 05/14/2015  . Claudication ([place]) 05/14/2015  . CAFL (chronic airflow limitation) ([place]) 05/14/2015  . Clinical depression 05/14/2015  . Acid reflux 05/14/2015  . Urinary incontinence 05/14/2015  . Pins and needles sensation 05/14/2015  . Obstructive apnea 05/14/2015  . Morbid obesity ([place]) 05/14/2015  . History of abnormal cervical Papanicolaou smear 05/14/2015  . Peripheral blood vessel disorder ([place]) 05/14/2015  . B12 deficiency 05/14/2015  . Vitamin D insufficiency 05/14/2015  . AAA (abdominal aortic aneurysm) without rupture ([place]) 05/26/2014  . Aortic heart valve narrowing 01/31/2013  . Malaise and fatigue 09/26/2012  . CAD in native artery 03/17/2009  . Diabetes mellitus, type 2 ([place]) 02/05/2009  . Hypercholesteremia 06/24/2008  . Allergic rhinitis 10/25/2007  . Airway hyperreactivity 10/25/2007  . Smoking greater than 30 pack years 10/25/2007  . Narrowing of intervertebral disc space 10/25/2007  . Benign essential HTN 10/25/2007  . Arthritis, degenerative 10/25/2007    Rubby Barbary PT, DPT 03/09/2020, 12:08 PM  [place] MAIN [facility] SERVICES [address] [place], [place], [address] Phone: [phone]   Fax:   [phone]  Name: Isobel Eisenhuth MRN: [id number] Date of Birth: 31-Jul-1956

## 2020-03-09 NOTE — Progress Notes (Signed)
Nielsen: Laura Nielsen  Service Category: E/M  Provider: Gaspar Cola, MD  DOB: August 03, 1956  DOS: 03/10/2020  Location: Office  MRN: [id number]  Setting: Ambulatory outpatient  Referring Provider: Birdie Sons, MD  Type: Established Nielsen  Specialty: Interventional Pain Management  PCP: Laura Sons, MD  Location: Remote location  Delivery: TeleHealth     Virtual Encounter - Pain Management PROVIDER NOTE: Information contained herein reflects review and annotations entered in association with encounter. Interpretation of such information and data should be left to medically-trained personnel. Information provided to Nielsen can be located elsewhere in Laura medical record under "Nielsen Instructions". Document created using STT-dictation technology, any transcriptional errors that may result from process are unintentional.    Contact & Pharmacy Preferred: [phone] Home: [phone] (home) Mobile: [phone] (mobile) E-mail: [email or web address], [email or web address] [address] Phone: [phone] Fax: [phone]   Pre-screening  Laura Nielsen "in-person" vs "virtual" encounter. She indicated preferring virtual for this encounter.   Reason COVID-19*  Social distancing based on CDC and AMA recommendations.   I contacted Laura Nielsen on 03/10/2020 via telephone.      I clearly identified myself as Laura Cola, MD. I verified that I was speaking with Laura correct person using two identifiers (Name: Laura Nielsen, and date of birth: 1956-05-21).  Consent I sought verbal advanced consent from Laura Nielsen for virtual visit interactions. I informed Laura Nielsen of possible security and privacy concerns, risks, and limitations associated with providing "not-in-person" medical evaluation and management services. I also informed Laura Nielsen of Laura availability of "in-person" appointments. Finally, I informed her that there  would be a charge for Laura virtual visit and that she could be  personally, fully or partially, financially responsible for it. Laura Nielsen expressed understanding and agreed to proceed.   Historic Elements   Laura Nielsen is a 64 y.o. year old, female Nielsen evaluated today after her last contact with our practice on Visit date not found. Laura Nielsen  has a past medical history of Allergy, Arthritis, Asthma, Depression, Diabetes mellitus without complication ([place]), Emphysema of lung ([place]), GERD (gastroesophageal reflux disease), Hyperlipidemia, Hypertension, Osteoporosis, and Tobacco abuse counseling. She also  has a past surgical history that includes vascular stent (03/28/2011); Cardiac catheterization (3/210); Kidney stone surgery (1999); Vaginal hysterectomy; and Tubal ligation. Laura Nielsen has a current medication list which includes Laura following prescription(s): albuterol, allopurinol, aspirin, bisoprolol, clopidogrel, cyanocobalamin, cyanocobalamin, gabapentin, ibuprofen, lisinopril-hydrochlorothiazide, loratadine, oxycodone, ozempic (0.25 or 0.5 mg/dose), and rosuvastatin. She  reports that she has been smoking cigarettes. She has a 66.75 pack-year smoking history. She has never used smokeless tobacco. She reports current alcohol use. She reports that she does not use drugs. Laura Nielsen is allergic to atorvastatin; omeprazole; and bupropion.   HPI  Today, she is being contacted for medication management. Laura Nielsen indicates doing well with Laura current medication regimen. No adverse reactions or side effects reported to Laura medications.  Laura Nielsen refers that she recently saw a neurologist who evaluated her and interpreted some nerve conduction test for her.  Apparently she has been experiencing some nystagmus.  She was told that she could go up on her Neurontin and she was given a prescription for gabapentin 200 mg so that she could added to Laura 300 mg that she can take for a total of 500 mg per dose.   However, she was unable to  tolerate this.  She was able to tolerate it at bedtime and it did help her sleep and therefore she wanted for me to know that.  I have Nielsen to go up on Laura dose that she takes at bedtime and we have decided to do that since she seems to tolerate it that well.  Instead of now taking 300 mg p.o. 3 times daily now she will be taking 300 mg p.o. twice daily, in addition to 600 mg at bedtime.  She understood and accepted.  Pharmacotherapy Assessment  Analgesic: Oxycodone IR 5 mg, 1 tab PO QD PRN (<5 mg/day of oxycodone) MME: <7.5 mg/day.   Monitoring: [place] PMP: PDMP reviewed during this encounter.       Pharmacotherapy: No side-effects or adverse reactions reported. Compliance: No problems identified. Effectiveness: Clinically acceptable. Plan: Refer to "POC".  UDS:  Summary  Date Value Ref Range Status  03/08/2017 FINAL  Final    Comment:    ==================================================================== TOXASSURE COMP DRUG ANALYSIS,UR ==================================================================== Test                             Result       Flag       Units Drug Present and Declared for Prescription Verification   Gabapentin                     PRESENT      EXPECTED   Amitriptyline                  PRESENT      EXPECTED   Nortriptyline                  PRESENT      EXPECTED    Nortriptyline is an expected metabolite of amitriptyline.   Salicylate                     PRESENT      EXPECTED Drug Absent but Declared for Prescription Verification   Oxycodone                      Not Detected UNEXPECTED ng/mg creat   Bupropion                      Not Detected UNEXPECTED   Acetaminophen                  Not Detected UNEXPECTED    Acetaminophen, as indicated in Laura declared medication list, is    not always detected even when used as directed.   Ibuprofen                      Not Detected UNEXPECTED    Ibuprofen, as indicated in Laura declared medication  list, is not    always detected even when used as directed. ==================================================================== Test                      Result    Flag   Units      Ref Range   Creatinine              160              mg/dL      >=20 ==================================================================== Declared Medications:  Laura flagging and interpretation on this report are based on Laura  following declared  medications.  Unexpected results may arise from  inaccuracies in Laura declared medications.  **Note: Laura testing scope of this panel includes these medications:  Amitriptyline (Elavil)  Bupropion (Wellbutrin)  Gabapentin  Oxycodone (Oxycodone Acetaminophen)  **Note: Laura testing scope of this panel does not include small to  moderate amounts of these reported medications:  Acetaminophen (Oxycodone Acetaminophen)  Aspirin  Ibuprofen  **Note: Laura testing scope of this panel does not include following  reported medications:  Albuterol  Allopurinol (Zyloprim)  Bisoprolol (Zebeta)  Clopidogrel (Plavix)  Cyanocobalamin  Lisinopril  Loratadine (Claritin)  Lovastatin (Mevacor)  Triamcinolone (Kenalog)  Vitamin C  Vitamin D ==================================================================== For clinical consultation, please call [phone]. ====================================================================    Laboratory Chemistry Profile   Renal Lab Results  Component Value Date   BUN 16 02/12/2019   CREATININE 0.60 11/14/2019   BCR 25 02/12/2019   GFRAA 111 02/12/2019   GFRNONAA 96 02/12/2019     Hepatic Lab Results  Component Value Date   AST 21 02/12/2019   ALT 19 02/12/2019   ALBUMIN 4.0 02/12/2019   ALKPHOS 117 02/12/2019   LIPASE 140 08/26/2012     Electrolytes Lab Results  Component Value Date   NA 143 02/12/2019   K 3.9 02/12/2019   CL 105 02/12/2019   CALCIUM 9.4 02/12/2019   MG 1.8 03/08/2017   PHOS 4.0 05/18/2016      Bone Lab Results  Component Value Date   VD25OH 25.2 (L) 02/12/2019   25OHVITD1 24 (L) 03/08/2017   25OHVITD2 8.2 03/08/2017   25OHVITD3 16 03/08/2017     Inflammation (CRP: Acute Phase) (ESR: Chronic Phase) Lab Results  Component Value Date   CRP <0.8 03/08/2017   ESRSEDRATE 5 03/08/2017       Note: Above Lab results reviewed.  Imaging  MM 3D SCREEN BREAST BILATERAL CLINICAL DATA:  Screening.  EXAM: DIGITAL SCREENING BILATERAL MAMMOGRAM WITH TOMO AND CAD  COMPARISON:  Previous exam(s).  ACR Breast Density Category a: Laura breast tissue is almost entirely fatty.  FINDINGS: There are no findings suspicious for malignancy. Images were processed with CAD.  IMPRESSION: No mammographic evidence of malignancy. A result letter of this screening mammogram will be mailed directly to Laura Nielsen.  RECOMMENDATION: Screening mammogram in one year. (Code:[id number]-B-01Y)  BI-RADS CATEGORY  1: Negative.  Electronically Signed   By: Nolon Nations M.D.   On: 02/13/2020 13:55  Assessment  Laura primary encounter diagnosis was Chronic low back pain (Primary Area of Pain) (Bilateral) (L>R). Diagnoses of Chronic neck pain (Secondary area of Pain) (Bilateral) (R>L), Chronic upper back pain (Third area of Pain) (Bilateral) (R>L), Chronic pain syndrome, and Neurogenic pain were also pertinent to this visit.  Plan of Care  Problem-specific:  No problem-specific Assessment & Plan notes found for this encounter.  Ms. Sumner Boesch has a current medication list which includes Laura following long-term medication(s): albuterol, allopurinol, bisoprolol, gabapentin, lisinopril-hydrochlorothiazide, loratadine, oxycodone, and rosuvastatin.  Pharmacotherapy (Medications Ordered): Meds ordered this encounter  Medications  . oxyCODONE (ROXICODONE) 5 MG immediate release tablet    Sig: Take 1 tablet (5 mg total) by mouth daily as needed for severe pain.    Dispense:  15 tablet    Refill:  0     Chronic Pain: STOP Act (Not applicable) Fill 1 day early if closed on refill date. Do not fill until: 03/10/2020. To last until: 08/25/2020. Avoid benzodiazepines within 8 hours of opioids  . gabapentin (NEURONTIN) 300 MG capsule    Sig: Take  1 capsule (300 mg total) by mouth 2 (two) times daily AND 2 capsules (600 mg total) at bedtime. Take 1 capsule (300 mg total) by mouth 3 (three) times daily..    Dispense:  120 capsule    Refill:  5    Fill one day early if pharmacy is closed on scheduled refill date. May substitute for generic if available.   Orders:  No orders of Laura defined types were placed in this encounter.  Follow-up plan:   Return in about 24 weeks (around 08/25/2020) for (F2F), (MM).      Interventional management options: Planned, scheduled, and/or pending:   Not at this time.   Considering:   Diagnostic bilateral lumbar facetblock #2  Possible bilateral lumbar facet RFA Diagnostic right CESI  Diagnostic bilateral cervical facetblock  Possible bilateral cervical facet RFA Diagnostic IA hip joint injection  Diagnostic bilateral femoral and obturator NB  Possible bilateral femoral and obturator nerve RFA Diagnostic right occipital NB Possible right occipital nerve RFA Possible right occipital peripheral nerve stimulator trial   Palliative PRN treatment(s):   Diagnostic/therapeutic bilateral lumbar facet block #2      Recent Visits Date Type Provider Dept  12/24/19 Telemedicine Milinda Pointer, MD [facility]  Showing recent visits within past 90 days and meeting all other requirements   Today's Visits Date Type Provider Dept  03/10/20 Telemedicine Milinda Pointer, MD [facility]  Showing today's visits and meeting all other requirements   Future Appointments No visits were found meeting these conditions.  Showing future appointments within next 90 days and meeting all other requirements   I discussed Laura assessment and  treatment plan with Laura Nielsen. Laura Nielsen was provided an opportunity to ask questions and all were answered. Laura Nielsen agreed with Laura plan and demonstrated an understanding of Laura instructions.  Nielsen advised to call back or seek an in-person evaluation if Laura symptoms or condition worsens.  Duration of encounter: 15 minutes.  Note by: Laura Cola, MD Date: 03/10/2020; Time: 3:21 PM

## 2020-03-10 ENCOUNTER — Ambulatory Visit: Payer: Medicare Other | Attending: Pain Medicine | Admitting: Pain Medicine

## 2020-03-10 ENCOUNTER — Other Ambulatory Visit: Payer: Self-pay

## 2020-03-10 DIAGNOSIS — M542 Cervicalgia: Secondary | ICD-10-CM | POA: Diagnosis not present

## 2020-03-10 DIAGNOSIS — M545 Low back pain: Secondary | ICD-10-CM

## 2020-03-10 DIAGNOSIS — G8929 Other chronic pain: Secondary | ICD-10-CM

## 2020-03-10 DIAGNOSIS — G894 Chronic pain syndrome: Secondary | ICD-10-CM | POA: Diagnosis not present

## 2020-03-10 DIAGNOSIS — M792 Neuralgia and neuritis, unspecified: Secondary | ICD-10-CM

## 2020-03-10 DIAGNOSIS — M546 Pain in thoracic spine: Secondary | ICD-10-CM

## 2020-03-10 MED ORDER — OXYCODONE HCL 5 MG PO TABS: 5.0000 mg | ORAL_TABLET | Freq: Every day | ORAL | 0 refills | Status: DC | PRN

## 2020-03-10 MED ORDER — GABAPENTIN 300 MG PO CAPS: ORAL_CAPSULE | ORAL | 5 refills | Status: DC

## 2020-03-11 ENCOUNTER — Other Ambulatory Visit: Payer: Self-pay | Admitting: Family Medicine

## 2020-03-11 ENCOUNTER — Encounter: Payer: Self-pay | Admitting: Physical Therapy

## 2020-03-11 ENCOUNTER — Ambulatory Visit: Payer: Medicare Other | Admitting: Physical Therapy

## 2020-03-11 ENCOUNTER — Other Ambulatory Visit: Payer: Self-pay

## 2020-03-11 DIAGNOSIS — M6281 Muscle weakness (generalized): Secondary | ICD-10-CM | POA: Diagnosis not present

## 2020-03-11 DIAGNOSIS — R2689 Other abnormalities of gait and mobility: Secondary | ICD-10-CM

## 2020-03-11 DIAGNOSIS — R262 Difficulty in walking, not elsewhere classified: Secondary | ICD-10-CM | POA: Diagnosis not present

## 2020-03-11 NOTE — Therapy (Signed)
[place] MAIN [facility] SERVICES [address] [place], [place], [address] Phone: [phone]   Fax:  [phone]  Physical Therapy Treatment  Patient Details  Name: Laura Nielsen MRN: [id number] Date of Birth: [birth year] Referring Provider (PT): shah Hemang   Encounter Date: 03/11/2020  PT End of Session - 03/11/20 1153    Visit Number  9    Number of Visits  17    Date for PT Re-Evaluation  03/11/20    PT Start Time  1147    PT Stop Time  1230    PT Time Calculation (min)  43 min    Equipment Utilized During Treatment  Gait belt    Activity Tolerance  Patient limited by pain    Behavior During Therapy  [facility] for tasks assessed/performed       Past Medical History:  Diagnosis Date  . Allergy   . Arthritis   . Asthma   . Depression   . Diabetes mellitus without complication ([place])   . Emphysema of lung ([place])   . GERD (gastroesophageal reflux disease)   . Hyperlipidemia   . Hypertension   . Osteoporosis   . Tobacco abuse counseling     Past Surgical History:  Procedure Laterality Date  . Cardiac catheterization  3/210   70-80% stenosis RCA stent placed. started on Plavix  . [place]  . TUBAL LIGATION    . VAGINAL HYSTERECTOMY     Menometrorrhagia. Excessive bleeding. Unknown if cervix removed.   . vascular stent  03/28/2011   Dr. Delana Meyer, [facility]; Infrarenal    There were no vitals filed for this visit.  Subjective Assessment - 03/11/20 1152    Subjective  Patient reports increased stiffness in low back. She reports adherence with HEP;    Pertinent History  Patient began having symprtoms of weakness, and poor balance and difficulty finding words 5 years ago. She walks without any device. She has had 1 fall and landed on the bed 1 year ago.    Limitations  Standing;Walking    How long can you sit comfortably?  unlimited    How long can you stand comfortably?  5 mins    How long can you walk comfortably?  40 feet     Patient Stated Goals  to walk better    Currently in Pain?  Yes    Pain Score  4     Pain Location  Back    Pain Orientation  Lower    Pain Descriptors / Indicators  Aching    Pain Type  Chronic pain    Pain Onset  More than a month ago    Pain Frequency  Intermittent    Aggravating Factors   unsure    Pain Relieving Factors  rest/heat    Effect of Pain on Daily Activities  decreased activity tolerance;    Multiple Pain Sites  No           TREATMENT:   Therapeutic exercise: Warm up on Nustep BUE/BLE level 2 x4 min (Unbilled);   Hooklying: Lumbar trunk rotation x10 reps each direction to help improve flexibility and reduce back discomfort; Required min VCs to avoid excessive ROM for better tolerance;  Green tband around BLE:  Hookling marching x20 Hooklying abd/ER x 20  Hooklying bridges x20 reps;  Patient sidelying:  Clamshells green tband x20 with min VCs to avoid trunk rotation to isolate hip abductor strengthening;   Seated hamstring curl green tband  x15 reps bilaterally with min Vcs for proper positioning to isolate LE strengthening;   NMR:  Sit<>stand with arms across chest x10 reps with cues for forward weight shift to improve transfer ability;   Weaving around cones #6 x2 laps with CGA for safety and cues to increase step length for better gait safety; Side stepping over cones #6 x1 lap each direction with min A for safety and cues to increase hip flexion and step length for better foot clearance;  Gait in hallway with head turns:  x100 feet side/side  x100 feet up/down Required min VCs to improve step length and increase erect posture to improve gait mechanics. She does exhibit increased unsteadiness with lateral head turns;   Marland KitchenPt educated throughout session about proper posture and technique with exercises. Patient tolerated session fair. She does fatigue quickly requiring short rest breaks. Patient instructed in advanced balance exercise to  challenge gait safety. She did require CGA when weaving around cones and did have some unsteadiness with turns.                       PT Education - 03/11/20 1153    Education Details  HEP/strengthening;    Person(s) Educated  Patient    Methods  Explanation;Verbal cues    Comprehension  Verbalized understanding;Returned demonstration;Verbal cues required;Need further instruction       PT Short Term Goals - 01/15/20 1428      PT SHORT TERM GOAL #1   Title  Patient will be independent in home exercise program to improve strength/mobility for better functional independence with ADLs.    Time  4    Period  Weeks    Status  New    Target Date  02/12/20      PT SHORT TERM GOAL #2   Title  Patient (> 56 years old) will complete five times sit to stand test in < 15 seconds indicating an increased LE strength and improved balance.    Time  4    Period  Weeks    Status  New    Target Date  02/12/20        PT Long Term Goals - 01/15/20 1429      PT LONG TERM GOAL #1   Title  Patient will increase Berg Balance score by > 6 points to demonstrate decreased fall risk during functional activities    Time  8    Period  Weeks    Status  New    Target Date  03/11/20      PT LONG TERM GOAL #2   Title  Patient will increase six minute walk test distance to >1000 for progression to community ambulator and improve gait ability    Time  8    Period  Weeks    Status  New    Target Date  03/11/20      PT LONG TERM GOAL #3   Title  Patient will increase 10 Nielsen walk test to >1.73m/s as to improve gait speed for better community ambulation and to reduce fall risk.    Time  8    Period  Weeks    Status  New    Target Date  03/11/20            Plan - 03/11/20 1231    Clinical Impression Statement  Patient motivated and participated well within session. Instructed patient in advanced LE strengthening, adjusting position for better tolerance to reduce back  pain.  Patient able to tolerate increased repetition with minimal difficulty. She does require min VCs for proper positioning to isolate optimal muscle activation. She does exhibit increased unsteadiness when making turns or when turning head, especially side/side. She would benefit from additional skilled PT intervention to improve strength, balance and mobility;    Personal Factors and Comorbidities  Comorbidity 1;Comorbidity 2    Comorbidities  diabetes,CHF    Examination-Activity Limitations  Bathing;Locomotion Level;Stand;Bed Mobility    Examination-Participation Restrictions  Cleaning;Community Activity    Rehab Potential  Fair    PT Frequency  2x / week    PT Duration  8 weeks    PT Treatment/Interventions  Therapeutic activities;Therapeutic exercise;Balance training;Neuromuscular re-education;Patient/family education;Gait training;Moist Heat;Electrical Stimulation;Manual techniques    PT Next Visit Plan  balance and strengthening    Consulted and Agree with Plan of Care  Patient       Patient will benefit from skilled therapeutic intervention in order to improve the following deficits and impairments:  Abnormal gait, Decreased endurance, Decreased range of motion, Decreased strength, Pain, Impaired sensation, Dizziness, Decreased mobility, Difficulty walking, Obesity  Visit Diagnosis: Muscle weakness (generalized)  Difficulty in walking, not elsewhere classified  Other abnormalities of gait and mobility     Problem List Patient Active Problem List   Diagnosis Date Noted  . Aortic atherosclerosis ([place]) 07/17/2019  . Tobacco abuse 01/10/2019  . Unspecified inflammatory spondylopathy, lumbar region ([place]) 10/04/2018  . Abnormal MRI, lumbar spine (03/28/2017) 07/04/2017  . Abnormal MRI, cervical spine (03/28/2017) 07/04/2017  . Lumbar facet syndrome (Bilateral) (L>R) 04/26/2017  . Lumbar facet hypertrophy (multilevel) (Bilateral) 04/26/2017  . Long term current use of anticoagulant  therapy 04/26/2017  . Neurogenic pain 04/26/2017  . Musculoskeletal pain 04/26/2017  . Chronic pain syndrome 03/08/2017  . Long term prescription opiate use 03/08/2017  . Opiate use 03/08/2017  . Chronic low back pain (Primary Area of Pain) (Bilateral) (L>R) 03/08/2017  . Chronic neck pain (Secondary area of Pain) (Bilateral) (R>L) 03/08/2017  . Cervicogenic headache (Right) 03/08/2017  . Chronic upper back pain (Third area of Pain) (Bilateral) (R>L) 03/08/2017  . Chronic hip pain (Bilateral) (L>R) 03/08/2017  . Osteoarthritis of hip (Bilateral) (L>R) 03/08/2017  . Cervical central spinal stenosis 03/08/2017  . Cervical spondylosis with radiculopathy (Right) (C5) 03/08/2017  . Lumbar spondylosis 03/08/2017  . Atherosclerosis of native arteries of extremity with intermittent claudication ([place]) 02/22/2017  . Gout 02/16/2016  . Ankle pain 11/17/2015  . Arthritis 05/14/2015  . Carotid artery narrowing 05/14/2015  . Claudication ([place]) 05/14/2015  . CAFL (chronic airflow limitation) ([place]) 05/14/2015  . Clinical depression 05/14/2015  . Acid reflux 05/14/2015  . Urinary incontinence 05/14/2015  . Pins and needles sensation 05/14/2015  . Obstructive apnea 05/14/2015  . Morbid obesity ([place]) 05/14/2015  . History of abnormal cervical Papanicolaou smear 05/14/2015  . Peripheral blood vessel disorder ([place]) 05/14/2015  . B12 deficiency 05/14/2015  . Vitamin D insufficiency 05/14/2015  . AAA (abdominal aortic aneurysm) without rupture ([place]) 05/26/2014  . Aortic heart valve narrowing 01/31/2013  . Malaise and fatigue 09/26/2012  . CAD in native artery 03/17/2009  . Diabetes mellitus, type 2 ([place]) 02/05/2009  . Hypercholesteremia 06/24/2008  . Allergic rhinitis 10/25/2007  . Airway hyperreactivity 10/25/2007  . Smoking greater than 30 pack years 10/25/2007  . Narrowing of intervertebral disc space 10/25/2007  . Benign essential HTN 10/25/2007  . Arthritis, degenerative 10/25/2007     Lennart Gladish PT, DPT 03/11/2020, 12:33 PM  [place]  [place] MAIN [facility] SERVICES [place], [place], [address] Phone: [phone]   Fax:  [phone]  Name: Laura Nielsen MRN: [id number] Date of Birth: 06/14/1956

## 2020-03-16 ENCOUNTER — Other Ambulatory Visit: Payer: Self-pay

## 2020-03-16 ENCOUNTER — Ambulatory Visit: Payer: Medicare Other | Admitting: Physical Therapy

## 2020-03-16 ENCOUNTER — Encounter: Payer: Self-pay | Admitting: Physical Therapy

## 2020-03-16 DIAGNOSIS — R2689 Other abnormalities of gait and mobility: Secondary | ICD-10-CM

## 2020-03-16 DIAGNOSIS — R262 Difficulty in walking, not elsewhere classified: Secondary | ICD-10-CM

## 2020-03-16 DIAGNOSIS — M6281 Muscle weakness (generalized): Secondary | ICD-10-CM

## 2020-03-16 NOTE — Therapy (Signed)
[place] MAIN [facility] SERVICES [address] [place], [place], [address] Phone: [phone]   Fax:  [phone]  Physical Therapy Treatment  Patient Details  Name: Laura Nielsen MRN: [id number] Date of Birth: [birth year] Referring Provider (PT): shah Hemang   Encounter Date: 03/16/2020  PT End of Session - 03/16/20 1323    Visit Number  10    Number of Visits  25    Date for PT Re-Evaluation  04/13/20    PT Start Time  1330    PT Stop Time  1415    PT Time Calculation (min)  45 min    Equipment Utilized During Treatment  Gait belt    Activity Tolerance  Patient limited by pain    Behavior During Therapy  WFL for tasks assessed/performed       Past Medical History:  Diagnosis Date  . Allergy   . Arthritis   . Asthma   . Depression   . Diabetes mellitus without complication ([place])   . Emphysema of lung ([place])   . GERD (gastroesophageal reflux disease)   . Hyperlipidemia   . Hypertension   . Osteoporosis   . Tobacco abuse counseling     Past Surgical History:  Procedure Laterality Date  . Cardiac catheterization  3/210   70-80% stenosis RCA stent placed. started on Plavix  . [place]  . TUBAL LIGATION    . VAGINAL HYSTERECTOMY     Menometrorrhagia. Excessive bleeding. Unknown if cervix removed.   . vascular stent  03/28/2011   Dr. Delana Meyer, [facility]; Infrarenal    There were no vitals filed for this visit.  Subjective Assessment - 03/16/20 1331    Subjective  Patient reports her back is a little better right now. She reports having some soreness this morning but states that it is better this afternoon;    Pertinent History  Patient began having symprtoms of weakness, and poor balance and difficulty finding words 5 years ago. She walks without any device. She has had 1 fall and landed on the bed 1 year ago.    Limitations  Standing;Walking    How long can you sit comfortably?  unlimited    How long can you stand  comfortably?  5 mins    How long can you walk comfortably?  40 feet    Patient Stated Goals  to walk better    Currently in Pain?  Yes    Pain Score  3     Pain Location  Back    Pain Orientation  Lower    Pain Descriptors / Indicators  Aching;Sore    Pain Type  Chronic pain    Pain Onset  More than a month ago    Pain Frequency  Intermittent    Aggravating Factors   unsure    Pain Relieving Factors  rest/heat    Effect of Pain on Daily Activities  decreased activity tolerance;    Multiple Pain Sites  No         [facility] PT Assessment - 03/16/20 0001      Berg Balance Test   Sit to Stand  Able to stand without using hands and stabilize independently    Standing Unsupported  Able to stand safely 2 minutes    Sitting with Back Unsupported but Feet Supported on Floor or Stool  Able to sit safely and securely 2 minutes    Stand to Sit  Sits safely with minimal use of  hands    Transfers  Able to transfer safely, minor use of hands    Standing Unsupported with Eyes Closed  Able to stand 10 seconds with supervision    Standing Unsupported with Feet Together  Able to place feet together independently and stand 1 minute safely    From Standing, Reach Forward with Outstretched Arm  Can reach confidently >25 cm (10")    From Standing Position, Pick up Object from Floor  Able to pick up shoe, needs supervision    From Standing Position, Turn to Look Behind Over each Shoulder  Looks behind from both sides and weight shifts well    Turn 360 Degrees  Able to turn 360 degrees safely but slowly    Standing Unsupported, Alternately Place Feet on Step/Stool  Able to stand independently and safely and complete 8 steps in 20 seconds    Standing Unsupported, One Foot in Front  Able to take small step independently and hold 30 seconds    Standing on One Leg  Tries to lift leg/unable to hold 3 seconds but remains standing independently    Total Score  47     TREATMENT: Warm up on Crosstrainer level 2 x4  min (unbilled)  STRENGTH on 01/15/20:  Graded on a 0-5 scale Muscle Group Left Right                          Hip Flex -4/5 -4/5  Hip Abd -3/5 -3/5  Hip Add 2/5 2/5  Hip Ext NT NT      Knee Flex 5/5 5/5  Knee Ext 5/5 5/5  Ankle DF 4/5 4/5  Ankle PF 1/5 3/5   STRENGTH on 03/16/20:  Graded on a 0-5 scale Muscle Group Left Right                          Hip Flex 4+/5 4+/5  Hip Abd 4-/5  4/5  Hip Add 3-/5 3-/5  Hip Ext NT NT      Knee Flex 5/5 5/5  Knee Ext 5/5 5/5  Ankle DF 4/5 4/5  Ankle PF 2/5 3-/5     OUTCOME MEASURES: TEST Outcome 01/15/20  Outcome 03/16/20 Interpretation  5 times sit<>stand 23.94sec  15.20 sec Without HHA >63 yo, >15 sec indicates increased risk for falls  10 meter walk test     . 68 m/s  0.967 m/s without AD <1.0 m/s indicates increased risk for falls; limited community ambulator  Timed up and Go     14.50 sec  10.85 sec without AD <14 sec indicates increased risk for falls  6 minute walk test  450  Feet  600 feet 1000 feet is community Conservator, museum/gallery Assessment/ 45/56  47/56 <36/56 (100% risk for falls), 37-45 (80% risk for falls); 46-51 (>50% risk for falls); 52-55 (lower risk <25% of falls)  ABC Scale  39.4%  47.5%   FOTO 45/100  48/100     Patient instructed in 5 times sit<>Stand, 10 meter walk, timed up and go, 6 min walk etc to address goals.   Patient's condition has the potential to improve in response to therapy. Maximum improvement is yet to be obtained. The anticipated improvement is attainable and reasonable in a generally predictable time.  Patient reports adherence with HEP. She continues to have some unsteadiness especially during gait tasks.  PT Education - 03/16/20 1323    Education Details  progress towards goals, strength/balance    Person(s) Educated  Patient    Methods  Explanation;Verbal cues    Comprehension  Verbalized  understanding;Returned demonstration;Verbal cues required;Need further instruction       PT Short Term Goals - 03/16/20 1333      PT SHORT TERM GOAL #1   Title  Patient will be independent in home exercise program to improve strength/mobility for better functional independence with ADLs.    Baseline  4/13: doing some everday    Time  4    Period  Weeks    Status  Achieved    Target Date  02/12/20      PT SHORT TERM GOAL #2   Title  Patient (> 17 years old) will complete five times sit to stand test in < 15 seconds indicating an increased LE strength and improved balance.    Baseline  4/13: 15.2 sec    Time  4    Period  Weeks    Status  Partially Met    Target Date  02/12/20        PT Long Term Goals - 03/16/20 1333      PT LONG TERM GOAL #1   Title  Patient will increase Berg Balance score by > 6 points to demonstrate decreased fall risk during functional activities    Time  4    Period  Weeks    Status  New    Target Date  04/13/20      PT LONG TERM GOAL #2   Title  Patient will increase six minute walk test distance to >1000 for progression to community ambulator and improve gait ability    Time  4    Period  Weeks    Status  New    Target Date  04/13/20      PT LONG TERM GOAL #3   Title  Patient will increase 10 meter walk test to >1.64ms as to improve gait speed for better community ambulation and to reduce fall risk.    Time  4    Period  Weeks    Status  New    Target Date  04/13/20            Plan - 03/16/20 1353    Clinical Impression Statement  Patient motivated and participated well within therapy; PT instructed patient in outcome measure to address progress. She has made significant improvement in LE strength and other outcome measures including sit<>Stand, 10 meter walk etc. She is still considered at an increased risk for falls. Patient would benefit from additional skilled PT intervention to improve balance and gait safety. She has had  limitations with tolerating weight bearing activities with intermittent back pain.    Personal Factors and Comorbidities  Comorbidity 1;Comorbidity 2    Comorbidities  diabetes,CHF    Examination-Activity Limitations  Bathing;Locomotion Level;Stand;Bed Mobility    Examination-Participation Restrictions  Cleaning;Community Activity    Rehab Potential  Fair    PT Frequency  2x / week    PT Duration  8 weeks    PT Treatment/Interventions  Therapeutic activities;Therapeutic exercise;Balance training;Neuromuscular re-education;Patient/family education;Gait training;Moist Heat;Electrical Stimulation;Manual techniques    PT Next Visit Plan  balance and strengthening    Consulted and Agree with Plan of Care  Patient       Patient will benefit from skilled therapeutic intervention in order to improve the following deficits and impairments:  Abnormal gait,  Decreased endurance, Decreased range of motion, Decreased strength, Pain, Impaired sensation, Dizziness, Decreased mobility, Difficulty walking, Obesity  Visit Diagnosis: Muscle weakness (generalized)  Difficulty in walking, not elsewhere classified  Other abnormalities of gait and mobility     Problem List Patient Active Problem List   Diagnosis Date Noted  . Aortic atherosclerosis ([place]) 07/17/2019  . Tobacco abuse 01/10/2019  . Unspecified inflammatory spondylopathy, lumbar region ([place]) 10/04/2018  . Abnormal MRI, lumbar spine (03/28/2017) 07/04/2017  . Abnormal MRI, cervical spine (03/28/2017) 07/04/2017  . Lumbar facet syndrome (Bilateral) (L>R) 04/26/2017  . Lumbar facet hypertrophy (multilevel) (Bilateral) 04/26/2017  . Long term current use of anticoagulant therapy 04/26/2017  . Neurogenic pain 04/26/2017  . Musculoskeletal pain 04/26/2017  . Chronic pain syndrome 03/08/2017  . Long term prescription opiate use 03/08/2017  . Opiate use 03/08/2017  . Chronic low back pain (Primary Area of Pain) (Bilateral) (L>R) 03/08/2017  .  Chronic neck pain (Secondary area of Pain) (Bilateral) (R>L) 03/08/2017  . Cervicogenic headache (Right) 03/08/2017  . Chronic upper back pain (Third area of Pain) (Bilateral) (R>L) 03/08/2017  . Chronic hip pain (Bilateral) (L>R) 03/08/2017  . Osteoarthritis of hip (Bilateral) (L>R) 03/08/2017  . Cervical central spinal stenosis 03/08/2017  . Cervical spondylosis with radiculopathy (Right) (C5) 03/08/2017  . Lumbar spondylosis 03/08/2017  . Atherosclerosis of native arteries of extremity with intermittent claudication ([place]) 02/22/2017  . Gout 02/16/2016  . Ankle pain 11/17/2015  . Arthritis 05/14/2015  . Carotid artery narrowing 05/14/2015  . Claudication ([place]) 05/14/2015  . CAFL (chronic airflow limitation) ([place]) 05/14/2015  . Clinical depression 05/14/2015  . Acid reflux 05/14/2015  . Urinary incontinence 05/14/2015  . Pins and needles sensation 05/14/2015  . Obstructive apnea 05/14/2015  . Morbid obesity ([place]) 05/14/2015  . History of abnormal cervical Papanicolaou smear 05/14/2015  . Peripheral blood vessel disorder ([place]) 05/14/2015  . B12 deficiency 05/14/2015  . Vitamin D insufficiency 05/14/2015  . AAA (abdominal aortic aneurysm) without rupture ([place]) 05/26/2014  . Aortic heart valve narrowing 01/31/2013  . Malaise and fatigue 09/26/2012  . CAD in native artery 03/17/2009  . Diabetes mellitus, type 2 ([place]) 02/05/2009  . Hypercholesteremia 06/24/2008  . Allergic rhinitis 10/25/2007  . Airway hyperreactivity 10/25/2007  . Smoking greater than 30 pack years 10/25/2007  . Narrowing of intervertebral disc space 10/25/2007  . Benign essential HTN 10/25/2007  . Arthritis, degenerative 10/25/2007    Taeshaun Rames PT, DPT 03/16/2020, 1:56 PM  [place] MAIN [facility] SERVICES [address] [place], [place], [address] Phone: [phone]   Fax:  [phone]  Name: Seleen Walter MRN: [id number] Date of Birth: 1956/03/03

## 2020-03-16 NOTE — Patient Instructions (Signed)
OUTCOME MEASURES: TEST Outcome 01/15/20  Outcome 03/16/20 Interpretation  5 times sit<>stand 23.94sec  15.20 sec Without HHA >63 yo, >15 sec indicates increased risk for falls  10 meter walk test     . 68 m/s  0.967 m/s without AD <1.0 m/s indicates increased risk for falls; limited community ambulator  Timed up and Go     14.50 sec  10.85 sec without AD <14 sec indicates increased risk for falls  6 minute walk test  450  Feet  600 feet 1000 feet is community Conservator, museum/gallery Assessment/ 45/56  47/56 <36/56 (100% risk for falls), 37-45 (80% risk for falls); 46-51 (>50% risk for falls); 52-55 (lower risk <25% of falls)  ABC Scale  39.4%  47.5%   FOTO 45/100  48/100

## 2020-03-23 ENCOUNTER — Ambulatory Visit: Payer: Medicare Other

## 2020-03-23 ENCOUNTER — Encounter: Payer: Self-pay | Admitting: Physical Therapy

## 2020-03-23 ENCOUNTER — Other Ambulatory Visit: Payer: Self-pay

## 2020-03-23 DIAGNOSIS — M6281 Muscle weakness (generalized): Secondary | ICD-10-CM | POA: Diagnosis not present

## 2020-03-23 DIAGNOSIS — R2689 Other abnormalities of gait and mobility: Secondary | ICD-10-CM

## 2020-03-23 DIAGNOSIS — R262 Difficulty in walking, not elsewhere classified: Secondary | ICD-10-CM | POA: Diagnosis not present

## 2020-03-23 NOTE — Therapy (Signed)
[place] MAIN [facility] SERVICES [address] [place], [place], [address] Phone: [phone]   Fax:  [phone]  Physical Therapy Treatment  Patient Details  Name: Laura Nielsen MRN: [id number] Date of Birth: Jun 30, 1956 Referring Provider (PT): shah Hemang   Encounter Date: 03/23/2020  PT End of Session - 03/23/20 1259    Visit Number  11    Number of Visits  25    Date for PT Re-Evaluation  04/13/20    PT Start Time  1300    PT Stop Time  1345    PT Time Calculation (min)  45 min    Equipment Utilized During Treatment  Gait belt    Activity Tolerance  Patient limited by pain    Behavior During Therapy  WFL for tasks assessed/performed       Past Medical History:  Diagnosis Date  . Allergy   . Arthritis   . Asthma   . Depression   . Diabetes mellitus without complication ([place])   . Emphysema of lung ([place])   . GERD (gastroesophageal reflux disease)   . Hyperlipidemia   . Hypertension   . Osteoporosis   . Tobacco abuse counseling     Past Surgical History:  Procedure Laterality Date  . Cardiac catheterization  3/210   70-80% stenosis RCA stent placed. started on Plavix  . [place]  . TUBAL LIGATION    . VAGINAL HYSTERECTOMY     Menometrorrhagia. Excessive bleeding. Unknown if cervix removed.   . vascular stent  03/28/2011   Dr. Delana Meyer, [facility]; Infrarenal    There were no vitals filed for this visit.  Subjective Assessment - 03/23/20 1258    Subjective  Patient reported she is having a good day today, reported some pain in her back but overall is doing well.    Pertinent History  Patient began having symprtoms of weakness, and poor balance and difficulty finding words 5 years ago. She walks without any device. She has had 1 fall and landed on the bed 1 year ago.    Limitations  Standing;Walking    How long can you sit comfortably?  unlimited    How long can you stand comfortably?  5 mins    How long can you  walk comfortably?  40 feet    Patient Stated Goals  to walk better    Currently in Pain?  Yes    Pain Score  2     Pain Location  Back    Pain Orientation  Lower    Pain Descriptors / Indicators  Aching;Sore    Pain Type  Chronic pain    Pain Onset  More than a month ago        TREATMENT:    Therapeutic exercise:  Hooklying: Lumbar trunk rotation x10 reps each direction to help improve flexibility and reduce back discomfort; Required min VCs to avoid excessive ROM for better tolerance;  Green tband around BLE:  Hookling marching x20 Hooklying abd/ER x 20  Hooklying bridges x20 reps;   Patient sidelying:  Clamshells green tband x20 with min VCs to avoid trunk rotation to isolate hip abductor strengthening;    Seated hamstring curl green tband x15 reps bilaterally with min Vcs for proper positioning to isolate LE strengthening;    NMR:  Sit<>stand with arms across chest x10 reps with cues for forward weight shift to improve transfer ability;   Sit <> Stand with blue foam underneath feet x10 reps  without UE support, CGA   Figure 8 around 2 cones to work on turns/pivots without LOB x4 rounds (cones about 8-21f apart)   Gait in hallway with head turns:             x100 feet side/side             x100 feet up/down  x1035ft with ball toss  x5030fgrape vine with step by step cueing for sequencing, increased difficulty noted with RLE coordination.  Required min VCs to improve step length and increase erect posture to improve gait mechanics.  She does exhibit increased unsteadiness with head turns;    Pt response/clinical impression: The patient reported some back pain with transitional movements, and exhibited some apprehension with sit to stands on unstable surface, but able to perform with CGA. Pt fatigued quickly with ambulation and is most challenged by dual task ambulation, did exhibit some staggering and unsteadiness. The patient would benefit from further skilled PT  intervention to continue to progress towards goals.     PT Education - 03/23/20 1258    Education Details  POC, HEP    Person(s) Educated  Patient    Methods  Explanation;Verbal cues    Comprehension  Verbalized understanding;Returned demonstration;Verbal cues required;Need further instruction       PT Short Term Goals - 03/16/20 1333      PT SHORT TERM GOAL #1   Title  Patient will be independent in home exercise program to improve strength/mobility for better functional independence with ADLs.    Baseline  4/13: doing some everday    Time  4    Period  Weeks    Status  Achieved    Target Date  02/12/20      PT SHORT TERM GOAL #2   Title  Patient (> 60 68ars old) will complete five times sit to stand test in < 15 seconds indicating an increased LE strength and improved balance.    Baseline  4/13: 15.2 sec    Time  4    Period  Weeks    Status  Partially Met    Target Date  02/12/20        PT Long Term Goals - 03/16/20 1333      PT LONG TERM GOAL #1   Title  Patient will increase Berg Balance score by > 6 points to demonstrate decreased fall risk during functional activities    Time  4    Period  Weeks    Status  Partially Met    Target Date  04/13/20      PT LONG TERM GOAL #2   Title  Patient will increase six minute walk test distance to >1000 for progression to community ambulator and improve gait ability    Time  4    Period  Weeks    Status  Partially Met    Target Date  04/13/20      PT LONG TERM GOAL #3   Title  Patient will increase 10 meter walk test to >1.1m/81ms to improve gait speed for better community ambulation and to reduce fall risk.    Time  4    Period  Weeks    Status  Partially Met    Target Date  04/13/20            Plan - 03/23/20 1259    Clinical Impression Statement  The patient reported some back pain with transitional movements, and exhibited some apprehension with  sit to stands on unstable surface, but able to perform with  CGA. Pt fatigued quickly with ambulation and is most challenged by dual task ambulation, did exhibit some staggering and unsteadiness. The patient would benefit from further skilled PT intervention to continue to progress towards goals.    Personal Factors and Comorbidities  Comorbidity 1;Comorbidity 2    Comorbidities  diabetes,CHF    Examination-Activity Limitations  Bathing;Locomotion Level;Stand;Bed Mobility    Examination-Participation Restrictions  Cleaning;Community Activity    Stability/Clinical Decision Making  Stable/Uncomplicated    Rehab Potential  Fair    PT Frequency  2x / week    PT Duration  4 weeks    PT Treatment/Interventions  Therapeutic activities;Therapeutic exercise;Balance training;Neuromuscular re-education;Patient/family education;Gait training;Moist Heat;Electrical Stimulation;Manual techniques    PT Next Visit Plan  balance and strengthening    Consulted and Agree with Plan of Care  Patient       Patient will benefit from skilled therapeutic intervention in order to improve the following deficits and impairments:  Abnormal gait, Decreased endurance, Decreased range of motion, Decreased strength, Pain, Impaired sensation, Dizziness, Decreased mobility, Difficulty walking, Obesity  Visit Diagnosis: Muscle weakness (generalized)  Difficulty in walking, not elsewhere classified  Other abnormalities of gait and mobility     Problem List Patient Active Problem List   Diagnosis Date Noted  . Aortic atherosclerosis ([place]) 07/17/2019  . Tobacco abuse 01/10/2019  . Unspecified inflammatory spondylopathy, lumbar region ([place]) 10/04/2018  . Abnormal MRI, lumbar spine (03/28/2017) 07/04/2017  . Abnormal MRI, cervical spine (03/28/2017) 07/04/2017  . Lumbar facet syndrome (Bilateral) (L>R) 04/26/2017  . Lumbar facet hypertrophy (multilevel) (Bilateral) 04/26/2017  . Long term current use of anticoagulant therapy 04/26/2017  . Neurogenic pain 04/26/2017  .  Musculoskeletal pain 04/26/2017  . Chronic pain syndrome 03/08/2017  . Long term prescription opiate use 03/08/2017  . Opiate use 03/08/2017  . Chronic low back pain (Primary Area of Pain) (Bilateral) (L>R) 03/08/2017  . Chronic neck pain (Secondary area of Pain) (Bilateral) (R>L) 03/08/2017  . Cervicogenic headache (Right) 03/08/2017  . Chronic upper back pain (Third area of Pain) (Bilateral) (R>L) 03/08/2017  . Chronic hip pain (Bilateral) (L>R) 03/08/2017  . Osteoarthritis of hip (Bilateral) (L>R) 03/08/2017  . Cervical central spinal stenosis 03/08/2017  . Cervical spondylosis with radiculopathy (Right) (C5) 03/08/2017  . Lumbar spondylosis 03/08/2017  . Atherosclerosis of native arteries of extremity with intermittent claudication ([place]) 02/22/2017  . Gout 02/16/2016  . Ankle pain 11/17/2015  . Arthritis 05/14/2015  . Carotid artery narrowing 05/14/2015  . Claudication ([place]) 05/14/2015  . CAFL (chronic airflow limitation) ([place]) 05/14/2015  . Clinical depression 05/14/2015  . Acid reflux 05/14/2015  . Urinary incontinence 05/14/2015  . Pins and needles sensation 05/14/2015  . Obstructive apnea 05/14/2015  . Morbid obesity ([place]) 05/14/2015  . History of abnormal cervical Papanicolaou smear 05/14/2015  . Peripheral blood vessel disorder ([place]) 05/14/2015  . B12 deficiency 05/14/2015  . Vitamin D insufficiency 05/14/2015  . AAA (abdominal aortic aneurysm) without rupture ([place]) 05/26/2014  . Aortic heart valve narrowing 01/31/2013  . Malaise and fatigue 09/26/2012  . CAD in native artery 03/17/2009  . Diabetes mellitus, type 2 ([place]) 02/05/2009  . Hypercholesteremia 06/24/2008  . Allergic rhinitis 10/25/2007  . Airway hyperreactivity 10/25/2007  . Smoking greater than 30 pack years 10/25/2007  . Narrowing of intervertebral disc space 10/25/2007  . Benign essential HTN 10/25/2007  . Arthritis, degenerative 10/25/2007    Lieutenant Diego PT, DPT 2:17 PM,03/23/20   [place]  [place] MAIN [facility] SERVICES [address] [place], [place], [address] Phone: [phone]   Fax:  [phone]  Name: Laura Nielsen MRN: [id number] Date of Birth: Feb 14, 1956

## 2020-03-24 ENCOUNTER — Telehealth: Payer: Federal, State, Local not specified - PPO | Admitting: Pain Medicine

## 2020-03-24 NOTE — Progress Notes (Deleted)
Established patient visit    Patient: Laura Nielsen   DOB: [birth year]   64 y.o. Female  MRN: [id number] Visit Date: 03/24/2020  Today's healthcare provider: Lelon Huh, MD   No chief complaint on file.  Subjective    HPI Diabetes Mellitus Type II, Follow-up  Lab Results  Component Value Date   HGBA1C 6.1 (A) 06/10/2019   HGBA1C 6.0 (H) 02/12/2019   HGBA1C 6.2 (A) 10/04/2018   Last seen for diabetes {1-12:18279} {days/wks/mos/yrs:310907} ago.  Management since then includes ***. She reports {excellent/good/fair/poor:19665} compliance with treatment. She {is/is not:21021397} having side effects. {document side effects if present:1} Symptoms: {Yes/No:20286} fatigue {Yes/No:20286} foot ulcerations {Yes/No:20286} appetite changes {Yes/No:20286} nausea {Yes/No:20286} paresthesia (numbness or tingling) of the feet  {Yes/No:20286} polydipsia (excessive thirst) {Yes/No:20286} polyuria (frequent urination) {Yes/No:20286} visual disturbances  {Yes/No:20286} vomiting  Home blood sugar records: {diabetes glucometry results:16657}  Episodes of hypoglycemia? {Yes/No:20286} {enter details if yes:1}   Current insulin regiment: {Type 'None' if not taking insulin                                            otherwise enter complete                                             details of insulin regiment:1} Most Recent Eye Exam: *** {Current exercise:16438:::1} {Current diet habits:16563:::1}  Pertinent Labs: Lab Results  Component Value Date   CHOL 138 02/12/2019   HDL 34 (L) 02/12/2019   LDLCALC 73 02/12/2019   TRIG 157 (H) 02/12/2019   CHOLHDL 4.1 02/12/2019   Lab Results  Component Value Date   NA 143 02/12/2019   K 3.9 02/12/2019   CO2 21 02/12/2019   GLUCOSE 108 (H) 02/12/2019   BUN 16 02/12/2019   CREATININE 0.60 11/14/2019   CALCIUM 9.4 02/12/2019   GFRNONAA 96 02/12/2019   GFRAA 111 02/12/2019     Wt Readings from Last 3 Encounters:  06/10/19 254 lb (115.2  kg)  05/05/19 255 lb (115.7 kg)  02/12/19 258 lb (117 kg)    -----------------------------------------------------------------------------------------  {Show patient history (optional):23778::" "}   Medications: Outpatient Medications Prior to Visit  Medication Sig  . albuterol (PROVENTIL HFA) 108 (90 Base) MCG/ACT inhaler Inhale 2 puffs into the lungs every 6 (six) hours as needed for wheezing or shortness of breath.  . allopurinol (ZYLOPRIM) 100 MG tablet Take 1 tablet (100 mg total) by mouth daily.  Marland Kitchen aspirin 81 MG tablet Take 81 mg by mouth daily.   . bisoprolol (ZEBETA) 10 MG tablet Take 1 tablet (10 mg total) by mouth daily.  . clopidogrel (PLAVIX) 75 MG tablet Take 1 tablet (75 mg total) by mouth daily.  . cyanocobalamin (,VITAMIN B-12,) 1000 MCG/ML injection Inject 43ml IM once a week for four weeks then once a month for four months.  . cyanocobalamin 1000 MCG tablet Take 1,000 mcg by mouth daily.  Marland Kitchen gabapentin (NEURONTIN) 300 MG capsule Take 1 capsule (300 mg total) by mouth 2 (two) times daily AND 2 capsules (600 mg total) at bedtime. Take 1 capsule (300 mg total) by mouth 3 (three) times daily..  . Ibuprofen 200 MG CAPS Take by mouth.   Marland Kitchen lisinopril-hydrochlorothiazide (ZESTORETIC) 20-12.5 MG tablet Take 1 tablet by mouth  daily.  . loratadine (CLARITIN) 10 MG tablet Take 10 mg by mouth daily as needed.   Marland Kitchen oxyCODONE (ROXICODONE) 5 MG immediate release tablet Take 1 tablet (5 mg total) by mouth daily as needed for severe pain.  Marland Kitchen OZEMPIC, 0.25 OR 0.5 MG/DOSE, 2 MG/1.5ML SOPN Inject 0.5 mg into the skin once a week.  . rosuvastatin (CRESTOR) 20 MG tablet Take 1 tablet (20 mg total) by mouth daily.   No facility-administered medications prior to visit.    Review of Systems  {Show previous labs (optional):23779::" "}   Objective    There were no vitals taken for this visit. {Show previous vital signs (optional):23777::" "}  Physical Exam  ***  No results found for any  visits on 03/30/20.   Assessment & Plan    ***  No follow-ups on file.      {provider attestation***:1}   Lelon Huh, MD  [facility] [phone] (phone) [phone] (fax)  [place]

## 2020-03-25 ENCOUNTER — Ambulatory Visit: Payer: Medicare Other | Admitting: Physical Therapy

## 2020-03-30 ENCOUNTER — Ambulatory Visit: Payer: Medicare Other | Admitting: Physical Therapy

## 2020-03-30 ENCOUNTER — Ambulatory Visit: Payer: Medicare Other | Admitting: Family Medicine

## 2020-04-01 ENCOUNTER — Other Ambulatory Visit: Payer: Self-pay

## 2020-04-01 ENCOUNTER — Encounter: Payer: Self-pay | Admitting: Physical Therapy

## 2020-04-01 ENCOUNTER — Ambulatory Visit: Payer: Medicare Other | Admitting: Physical Therapy

## 2020-04-01 DIAGNOSIS — M6281 Muscle weakness (generalized): Secondary | ICD-10-CM

## 2020-04-01 DIAGNOSIS — R2689 Other abnormalities of gait and mobility: Secondary | ICD-10-CM | POA: Diagnosis not present

## 2020-04-01 DIAGNOSIS — R262 Difficulty in walking, not elsewhere classified: Secondary | ICD-10-CM | POA: Diagnosis not present

## 2020-04-01 NOTE — Therapy (Signed)
[place] MAIN [facility] SERVICES [address] [place], [place], [address] Phone: [phone]   Fax:  [phone]  Physical Therapy Treatment  Patient Details  Name: Laura Nielsen MRN: [id number] Date of Birth: [birth year] Referring Provider (PT): shah Hemang   Encounter Date: 04/01/2020  PT End of Session - 04/01/20 1306    Visit Number  12    Number of Visits  25    Date for PT Re-Evaluation  05/06/20    PT Start Time  1300    PT Stop Time  1345    PT Time Calculation (min)  45 min    Equipment Utilized During Treatment  Gait belt    Activity Tolerance  Patient limited by pain    Behavior During Therapy  WFL for tasks assessed/performed       Past Medical History:  Diagnosis Date  . Allergy   . Arthritis   . Asthma   . Depression   . Diabetes mellitus without complication ([place])   . Emphysema of lung ([place])   . GERD (gastroesophageal reflux disease)   . Hyperlipidemia   . Hypertension   . Osteoporosis   . Tobacco abuse counseling     Past Surgical History:  Procedure Laterality Date  . Cardiac catheterization  3/210   70-80% stenosis RCA stent placed. started on Plavix  . [place]  . TUBAL LIGATION    . VAGINAL HYSTERECTOMY     Menometrorrhagia. Excessive bleeding. Unknown if cervix removed.   . vascular stent  03/28/2011   Dr. Delana Meyer, [facility]; Infrarenal    There were no vitals filed for this visit.  Subjective Assessment - 04/01/20 1315    Currently in Pain?  Yes    Pain Location  Back    Pain Orientation  Lower    Pain Descriptors / Indicators  Aching    Pain Type  Chronic pain    Pain Frequency  Constant    Aggravating Factors   unsure    Pain Relieving Factors  rest / heat    Multiple Pain Sites  No       Treatment: NU-step x 5 mins  Neuromuscular Re-education  [place]  fwd/bwd, side to side x 20 each direction standing on 1/2 foam and lateral tapes to stepping stones without UE support x  20 standing on foam and fwd tapes to stepping stones without UE support x 20 Side stepping on foam without UE support x 2 lengths Heel/toe raises without UE support 3s hold x 10 each Lateral side steps from foam to 6 inch stool left and right x 15 Backwards stepping from foam to 6 inch stool x 15     Pt educated throughout session about proper posture and technique with exercises. Improved exercise technique, movement at target joints, use of target muscles after min to mod verbal, visual, tactile cues.                     PT Education - 04/01/20 1306    Education Details  plan of care    Person(s) Educated  Patient    Methods  Explanation    Comprehension  Verbalized understanding       PT Short Term Goals - 03/16/20 1333      PT SHORT TERM GOAL #1   Title  Patient will be independent in home exercise program to improve strength/mobility for better functional independence with ADLs.    Baseline  4/13:  doing some everday    Time  4    Period  Weeks    Status  Achieved    Target Date  02/12/20      PT SHORT TERM GOAL #2   Title  Patient (64 years old) will complete five times sit to stand test in < 15 seconds indicating an increased LE strength and improved balance.    Baseline  4/13: 15.2 sec    Time  4    Period  Weeks    Status  Partially Met    Target Date  02/12/20        PT Long Term Goals - 04/01/20 1308      PT LONG TERM GOAL #1   Title  Patient will increase Berg Balance score by > 6 points to demonstrate decreased fall risk during functional activities    Time  8    Period  Weeks    Status  Partially Met    Target Date  05/06/20      PT LONG TERM GOAL #2   Title  Patient will increase six minute walk test distance to >1000 for progression to community ambulator and improve gait ability    Time  8    Period  Weeks    Status  Partially Met    Target Date  05/06/20      PT LONG TERM GOAL #3   Title  Patient will increase 10 meter walk  test to >1.49ms as to improve gait speed for better community ambulation and to reduce fall risk.    Time  8    Period  Weeks    Status  Partially Met    Target Date  05/06/20            Plan - 04/01/20 1307    Clinical Impression Statement  Patient instructed in intermediate strengthening and balance exercise.  Patient requires min Vcs for correct exercise technique including to improve LE control with standing exercise. Patient demonstrates better ankle control with rail assist. Patient would benefit from additional skilled PT intervention to improve balance/gait safety and reduce fall risk.   Personal Factors and Comorbidities  Comorbidity 1;Comorbidity 2    Comorbidities  diabetes,CHF    Examination-Activity Limitations  Bathing;Locomotion Level;Stand;Bed Mobility    Examination-Participation Restrictions  Cleaning;Community Activity    Stability/Clinical Decision Making  Stable/Uncomplicated    Rehab Potential  Fair    PT Frequency  2x / week    PT Duration  8 weeks    PT Treatment/Interventions  Therapeutic activities;Therapeutic exercise;Balance training;Neuromuscular re-education;Patient/family education;Gait training;Moist Heat;Electrical Stimulation;Manual techniques    PT Next Visit Plan  balance and strengthening    Consulted and Agree with Plan of Care  Patient       Patient will benefit from skilled therapeutic intervention in order to improve the following deficits and impairments:  Abnormal gait, Decreased endurance, Decreased range of motion, Decreased strength, Pain, Impaired sensation, Dizziness, Decreased mobility, Difficulty walking, Obesity  Visit Diagnosis: Muscle weakness (generalized)  Difficulty in walking, not elsewhere classified  Other abnormalities of gait and mobility     Problem List Patient Active Problem List   Diagnosis Date Noted  . Aortic atherosclerosis ([place] 07/17/2019  . Tobacco abuse 01/10/2019  . Unspecified inflammatory  spondylopathy, lumbar region ([place] 10/04/2018  . Abnormal MRI, lumbar spine (03/28/2017) 07/04/2017  . Abnormal MRI, cervical spine (03/28/2017) 07/04/2017  . Lumbar facet syndrome (Bilateral) (L>R) 04/26/2017  . Lumbar facet hypertrophy (multilevel) (Bilateral)  04/26/2017  . Long term current use of anticoagulant therapy 04/26/2017  . Neurogenic pain 04/26/2017  . Musculoskeletal pain 04/26/2017  . Chronic pain syndrome 03/08/2017  . Long term prescription opiate use 03/08/2017  . Opiate use 03/08/2017  . Chronic low back pain (Primary Area of Pain) (Bilateral) (L>R) 03/08/2017  . Chronic neck pain (Secondary area of Pain) (Bilateral) (R>L) 03/08/2017  . Cervicogenic headache (Right) 03/08/2017  . Chronic upper back pain (Third area of Pain) (Bilateral) (R>L) 03/08/2017  . Chronic hip pain (Bilateral) (L>R) 03/08/2017  . Osteoarthritis of hip (Bilateral) (L>R) 03/08/2017  . Cervical central spinal stenosis 03/08/2017  . Cervical spondylosis with radiculopathy (Right) (C5) 03/08/2017  . Lumbar spondylosis 03/08/2017  . Atherosclerosis of native arteries of extremity with intermittent claudication ([place]) 02/22/2017  . Gout 02/16/2016  . Ankle pain 11/17/2015  . Arthritis 05/14/2015  . Carotid artery narrowing 05/14/2015  . Claudication ([place]) 05/14/2015  . CAFL (chronic airflow limitation) ([place]) 05/14/2015  . Clinical depression 05/14/2015  . Acid reflux 05/14/2015  . Urinary incontinence 05/14/2015  . Pins and needles sensation 05/14/2015  . Obstructive apnea 05/14/2015  . Morbid obesity ([place]) 05/14/2015  . History of abnormal cervical Papanicolaou smear 05/14/2015  . Peripheral blood vessel disorder ([place]) 05/14/2015  . B12 deficiency 05/14/2015  . Vitamin D insufficiency 05/14/2015  . AAA (abdominal aortic aneurysm) without rupture ([place]) 05/26/2014  . Aortic heart valve narrowing 01/31/2013  . Malaise and fatigue 09/26/2012  . CAD in native artery 03/17/2009  . Diabetes  mellitus, type 2 ([place]) 02/05/2009  . Hypercholesteremia 06/24/2008  . Allergic rhinitis 10/25/2007  . Airway hyperreactivity 10/25/2007  . Smoking greater than 30 pack years 10/25/2007  . Narrowing of intervertebral disc space 10/25/2007  . Benign essential HTN 10/25/2007  . Arthritis, degenerative 10/25/2007    Alanson Puls, PT DPT 04/01/2020, 1:17 PM  [place] MAIN [facility] SERVICES [address] [place], [place], [address] Phone: [phone]   Fax:  [phone]  Name: Winna Golla MRN: [id number] Date of Birth: 1956/10/13

## 2020-04-05 ENCOUNTER — Ambulatory Visit: Payer: Medicare Other | Attending: Neurology | Admitting: Physical Therapy

## 2020-04-05 ENCOUNTER — Encounter: Payer: Self-pay | Admitting: Physical Therapy

## 2020-04-05 ENCOUNTER — Other Ambulatory Visit: Payer: Self-pay

## 2020-04-05 DIAGNOSIS — M6281 Muscle weakness (generalized): Secondary | ICD-10-CM | POA: Insufficient documentation

## 2020-04-05 DIAGNOSIS — R262 Difficulty in walking, not elsewhere classified: Secondary | ICD-10-CM | POA: Insufficient documentation

## 2020-04-05 DIAGNOSIS — R2689 Other abnormalities of gait and mobility: Secondary | ICD-10-CM

## 2020-04-05 NOTE — Therapy (Signed)
[place] MAIN [facility] SERVICES [address] [place], [place], [address] Phone: [phone]   Fax:  [phone]  Physical Therapy Treatment  Patient Details  Name: Laura Nielsen MRN: [id number] Date of Birth: [birth year] Referring Provider (PT): shah Hemang   Encounter Date: 04/05/2020  PT End of Session - 04/05/20 1051    Visit Number  13    Number of Visits  25    Date for PT Re-Evaluation  05/06/20    PT Start Time  1100    PT Stop Time  1140    PT Time Calculation (min)  40 min    Equipment Utilized During Treatment  Gait belt    Activity Tolerance  Patient limited by pain    Behavior During Therapy  WFL for tasks assessed/performed       Past Medical History:  Diagnosis Date  . Allergy   . Arthritis   . Asthma   . Depression   . Diabetes mellitus without complication ([place])   . Emphysema of lung ([place])   . GERD (gastroesophageal reflux disease)   . Hyperlipidemia   . Hypertension   . Osteoporosis   . Tobacco abuse counseling     Past Surgical History:  Procedure Laterality Date  . Cardiac catheterization  3/210   70-80% stenosis RCA stent placed. started on Plavix  . [place]  . TUBAL LIGATION    . VAGINAL HYSTERECTOMY     Menometrorrhagia. Excessive bleeding. Unknown if cervix removed.   . vascular stent  03/28/2011   Dr. Delana Meyer, [facility]; Infrarenal    There were no vitals filed for this visit.  Subjective Assessment - 04/05/20 1051    Subjective  Patient  reported that she is having 4/10 back pain.    Pertinent History  Patient began having symprtoms of weakness, and poor balance and difficulty finding words 5 years ago. She walks without any device. She has had 1 fall and landed on the bed 1 year ago.    Limitations  Standing;Walking    How long can you sit comfortably?  unlimited    How long can you stand comfortably?  5 mins    How long can you walk comfortably?  40 feet    Patient Stated Goals  to  walk better    Currently in Pain?  Yes    Pain Score  4     Pain Location  Knee    Pain Descriptors / Indicators  Aching    Pain Onset  More than a month ago       Neuromuscular Re-education  Hurdle  fwd/bwd, side to side x 20 each direction Tandem on 1/2 foam and lateral tapes to stepping stones without UE support x 20 standing on foam and fwd tapes to stepping stones without UE support x 20 Side stepping on foam without UE support x 2 lengths Eccentric step downs from stool to purple foam x 15 ,verbal cues to complete slow and tap heel.  BUE CGA Heel/toe raises without UE support 3s hold x 10 each Lateral side steps from foam to 6 inch stool left and right x 15 Ther-ex  Hip flexion marches with 2# ankle weights x 10 bilateral; Hip abduction with 2# x 10 bilateral Hip extension with 2# x 10 bilateral Lunges to BOSU ball x 15 BLE Leg press 40 lbs x 20 x 2    Patient needs occasional verbal cueing to improve posture and cueing to correctly  perform exercises slowly, holding at end of range to increase motor firing of desired muscle to encourage fatigue.                         PT Education - 04/05/20 1051    Education Details  HEP    Person(s) Educated  Patient    Methods  Explanation    Comprehension  Returned demonstration;Need further instruction       PT Short Term Goals - 03/16/20 1333      PT SHORT TERM GOAL #1   Title  Patient will be independent in home exercise program to improve strength/mobility for better functional independence with ADLs.    Baseline  4/13: doing some everday    Time  4    Period  Weeks    Status  Achieved    Target Date  02/12/20      PT SHORT TERM GOAL #2   Title  Patient (> 63 years old) will complete five times sit to stand test in < 15 seconds indicating an increased LE strength and improved balance.    Baseline  4/13: 15.2 sec    Time  4    Period  Weeks    Status  Partially Met    Target Date  02/12/20         PT Long Term Goals - 04/01/20 1308      PT LONG TERM GOAL #1   Title  Patient will increase Berg Balance score by > 6 points to demonstrate decreased fall risk during functional activities    Time  8    Period  Weeks    Status  Partially Met    Target Date  05/06/20      PT LONG TERM GOAL #2   Title  Patient will increase six minute walk test distance to >1000 for progression to community ambulator and improve gait ability    Time  8    Period  Weeks    Status  Partially Met    Target Date  05/06/20      PT LONG TERM GOAL #3   Title  Patient will increase 10 meter walk test to >1.66ms as to improve gait speed for better community ambulation and to reduce fall risk.    Time  8    Period  Weeks    Status  Partially Met    Target Date  05/06/20            Plan - 04/05/20 1052    Clinical Impression Statement   Pt was able to perform all balance and strengthening exercises today with SBA..Marland KitchenPt was able to perform LE  strength exercises demonstrating improvements in LE strength and stability.   Pt requires verbal, visual and tactile cues during exercise in order to complete tasks with proper form and technique.  Pt would continue to benefit from skilled PT services in order to further strengthen LE's, in order to increase functional mobility and decrease risk of falls   Personal Factors and Comorbidities  Comorbidity 1;Comorbidity 2    Comorbidities  diabetes,CHF    Examination-Activity Limitations  Bathing;Locomotion Level;Stand;Bed Mobility    Examination-Participation Restrictions  Cleaning;Community Activity    Stability/Clinical Decision Making  Stable/Uncomplicated    Rehab Potential  Fair    PT Frequency  2x / week    PT Duration  8 weeks    PT Treatment/Interventions  Therapeutic activities;Therapeutic exercise;Balance training;Neuromuscular re-education;Patient/family education;Gait training;Moist  Heat;Electrical Stimulation;Manual techniques    PT Next Visit Plan   balance and strengthening    Consulted and Agree with Plan of Care  Patient       Patient will benefit from skilled therapeutic intervention in order to improve the following deficits and impairments:  Abnormal gait, Decreased endurance, Decreased range of motion, Decreased strength, Pain, Impaired sensation, Dizziness, Decreased mobility, Difficulty walking, Obesity  Visit Diagnosis: Muscle weakness (generalized)  Difficulty in walking, not elsewhere classified  Other abnormalities of gait and mobility     Problem List Patient Active Problem List   Diagnosis Date Noted  . Aortic atherosclerosis ([place]) 07/17/2019  . Tobacco abuse 01/10/2019  . Unspecified inflammatory spondylopathy, lumbar region ([place]) 10/04/2018  . Abnormal MRI, lumbar spine (03/28/2017) 07/04/2017  . Abnormal MRI, cervical spine (03/28/2017) 07/04/2017  . Lumbar facet syndrome (Bilateral) (L>R) 04/26/2017  . Lumbar facet hypertrophy (multilevel) (Bilateral) 04/26/2017  . Long term current use of anticoagulant therapy 04/26/2017  . Neurogenic pain 04/26/2017  . Musculoskeletal pain 04/26/2017  . Chronic pain syndrome 03/08/2017  . Long term prescription opiate use 03/08/2017  . Opiate use 03/08/2017  . Chronic low back pain (Primary Area of Pain) (Bilateral) (L>R) 03/08/2017  . Chronic neck pain (Secondary area of Pain) (Bilateral) (R>L) 03/08/2017  . Cervicogenic headache (Right) 03/08/2017  . Chronic upper back pain (Third area of Pain) (Bilateral) (R>L) 03/08/2017  . Chronic hip pain (Bilateral) (L>R) 03/08/2017  . Osteoarthritis of hip (Bilateral) (L>R) 03/08/2017  . Cervical central spinal stenosis 03/08/2017  . Cervical spondylosis with radiculopathy (Right) (C5) 03/08/2017  . Lumbar spondylosis 03/08/2017  . Atherosclerosis of native arteries of extremity with intermittent claudication ([place]) 02/22/2017  . Gout 02/16/2016  . Ankle pain 11/17/2015  . Arthritis 05/14/2015  . Carotid artery narrowing  05/14/2015  . Claudication ([place]) 05/14/2015  . CAFL (chronic airflow limitation) ([place]) 05/14/2015  . Clinical depression 05/14/2015  . Acid reflux 05/14/2015  . Urinary incontinence 05/14/2015  . Pins and needles sensation 05/14/2015  . Obstructive apnea 05/14/2015  . Morbid obesity ([place]) 05/14/2015  . History of abnormal cervical Papanicolaou smear 05/14/2015  . Peripheral blood vessel disorder ([place]) 05/14/2015  . B12 deficiency 05/14/2015  . Vitamin D insufficiency 05/14/2015  . AAA (abdominal aortic aneurysm) without rupture ([place]) 05/26/2014  . Aortic heart valve narrowing 01/31/2013  . Malaise and fatigue 09/26/2012  . CAD in native artery 03/17/2009  . Diabetes mellitus, type 2 ([place]) 02/05/2009  . Hypercholesteremia 06/24/2008  . Allergic rhinitis 10/25/2007  . Airway hyperreactivity 10/25/2007  . Smoking greater than 30 pack years 10/25/2007  . Narrowing of intervertebral disc space 10/25/2007  . Benign essential HTN 10/25/2007  . Arthritis, degenerative 10/25/2007    Alanson Puls, PT DPT 04/05/2020, 11:15 AM  [place] MAIN [facility] SERVICES [address] [place], [place], [address] Phone: [phone]   Fax:  [phone]  Name: Demari Kropp MRN: [id number] Date of Birth: Jun 24, 1956

## 2020-04-07 ENCOUNTER — Other Ambulatory Visit: Payer: Self-pay

## 2020-04-07 ENCOUNTER — Encounter: Payer: Self-pay | Admitting: Physical Therapy

## 2020-04-07 ENCOUNTER — Ambulatory Visit: Payer: Medicare Other | Admitting: Physical Therapy

## 2020-04-07 DIAGNOSIS — R262 Difficulty in walking, not elsewhere classified: Secondary | ICD-10-CM

## 2020-04-07 DIAGNOSIS — R2689 Other abnormalities of gait and mobility: Secondary | ICD-10-CM

## 2020-04-07 DIAGNOSIS — M6281 Muscle weakness (generalized): Secondary | ICD-10-CM | POA: Diagnosis not present

## 2020-04-07 NOTE — Therapy (Signed)
[place] MAIN [facility] SERVICES [address] [place], [place], [address] Phone: [phone]   Fax:  [phone]  Physical Therapy Treatment  Patient Details  Name: Laura Nielsen MRN: [id number] Date of Birth: Jul 29, 1956 Referring Provider (PT): shah Hemang   Encounter Date: 04/07/2020  PT End of Session - 04/07/20 1056    Visit Number  14    Number of Visits  25    Date for PT Re-Evaluation  05/06/20    PT Start Time  1055    PT Stop Time  1140    PT Time Calculation (min)  45 min    Equipment Utilized During Treatment  Gait belt    Activity Tolerance  Patient limited by pain    Behavior During Therapy  [facility] for tasks assessed/performed       Past Medical History:  Diagnosis Date  . Allergy   . Arthritis   . Asthma   . Depression   . Diabetes mellitus without complication ([place])   . Emphysema of lung ([place])   . GERD (gastroesophageal reflux disease)   . Hyperlipidemia   . Hypertension   . Osteoporosis   . Tobacco abuse counseling     Past Surgical History:  Procedure Laterality Date  . Cardiac catheterization  3/210   70-80% stenosis RCA stent placed. started on Plavix  . [place]  . TUBAL LIGATION    . VAGINAL HYSTERECTOMY     Menometrorrhagia. Excessive bleeding. Unknown if cervix removed.   . vascular stent  03/28/2011   Dr. Delana Meyer, [facility]; Infrarenal    There were no vitals filed for this visit.  Subjective Assessment - 04/07/20 1054    Subjective  Patient  reported that she is having 4/10 back pain. Her left knee and thigh have a pinching feeling and her knee feels like it can go out.    Pertinent History  Patient began having symprtoms of weakness, and poor balance and difficulty finding words 5 years ago. She walks without any device. She has had 1 fall and landed on the bed 1 year ago.    Limitations  Standing;Walking    How long can you sit comfortably?  unlimited    How long can you stand  comfortably?  5 mins    How long can you walk comfortably?  40 feet    Patient Stated Goals  to walk better    Currently in Pain?  Yes    Pain Score  4     Pain Location  Back    Pain Onset  More than a month ago         Treatment: Neuromuscular Training: Stool: staggered stance, ball fwd/bwd  x 5 reps each, each foot in front; VCs for proper technique and positioning for each exercise staggered stance, trunk rotation with ball, VC to keep UE straight and turn head with trunk  Hurdle: Forward and backward stepping over hurdle x15 each direction, VCs to take big enough steps and to try to increase speed to work on coordination  Side stepping over hurdle 15x each direction  Blue Foam: Side stepping x10 on blue balance CGA for safety, VCs for taking a big enough step  Airex pad trunk rotation x2 min, CGA for safety, demonstrated difficulty with keeping arms extended and full rotation with head turn  BOSU: Lunge to BOSU ball x 10 , cues for going slow and to control the speed  Leg press  40 lbs  x 20 x 2   Patient had an episode of nausea that lasted for several minutes   Pt educated throughout session about proper posture and technique with exercises. Improved exercise technique, movement at target joints, use of target muscles after min to mod verbal, visual, tactile cues.                     PT Education - 04/07/20 1055    Education Details  HEP    Person(s) Educated  Patient    Methods  Explanation    Comprehension  Verbalized understanding       PT Short Term Goals - 03/16/20 1333      PT SHORT TERM GOAL #1   Title  Patient will be independent in home exercise program to improve strength/mobility for better functional independence with ADLs.    Baseline  4/13: doing some everday    Time  4    Period  Weeks    Status  Achieved    Target Date  02/12/20      PT SHORT TERM GOAL #2   Title  Patient (> 18 years old) will complete five times sit to  stand test in < 15 seconds indicating an increased LE strength and improved balance.    Baseline  4/13: 15.2 sec    Time  4    Period  Weeks    Status  Partially Met    Target Date  02/12/20        PT Long Term Goals - 04/01/20 1308      PT LONG TERM GOAL #1   Title  Patient will increase Berg Balance score by > 6 points to demonstrate decreased fall risk during functional activities    Time  8    Period  Weeks    Status  Partially Met    Target Date  05/06/20      PT LONG TERM GOAL #2   Title  Patient will increase six minute walk test distance to >1000 for progression to community ambulator and improve gait ability    Time  8    Period  Weeks    Status  Partially Met    Target Date  05/06/20      PT LONG TERM GOAL #3   Title  Patient will increase 10 meter walk test to >1.30ms as to improve gait speed for better community ambulation and to reduce fall risk.    Time  8    Period  Weeks    Status  Partially Met    Target Date  05/06/20            Plan - 04/07/20 1056    Clinical Impression Statement  Patient instructed in intermediate balance/strengthening exercise. Patient fatigues quickly requiring short rest breaks in between intermediate exercise. Patient instructed in advanced strengthening with increased repetition/resistance. Patient requires CGA for intermediate balance exercise especially with less rail assist. Patient would benefit from additional skilled PT intervention to improve balance/gait safety and reduce fall risk.   Personal Factors and Comorbidities  Comorbidity 1;Comorbidity 2    Comorbidities  diabetes,CHF    Examination-Activity Limitations  Bathing;Locomotion Level;Stand;Bed Mobility    Examination-Participation Restrictions  Cleaning;Community Activity    Stability/Clinical Decision Making  Stable/Uncomplicated    Rehab Potential  Fair    PT Frequency  2x / week    PT Duration  8 weeks    PT Treatment/Interventions  Therapeutic  activities;Therapeutic exercise;Balance training;Neuromuscular re-education;Patient/family education;Gait  training;Moist Heat;Electrical Stimulation;Manual techniques    PT Next Visit Plan  balance and strengthening    Consulted and Agree with Plan of Care  Patient       Patient will benefit from skilled therapeutic intervention in order to improve the following deficits and impairments:  Abnormal gait, Decreased endurance, Decreased range of motion, Decreased strength, Pain, Impaired sensation, Dizziness, Decreased mobility, Difficulty walking, Obesity  Visit Diagnosis: Muscle weakness (generalized)  Difficulty in walking, not elsewhere classified  Other abnormalities of gait and mobility     Problem List Patient Active Problem List   Diagnosis Date Noted  . Aortic atherosclerosis ([place]) 07/17/2019  . Tobacco abuse 01/10/2019  . Unspecified inflammatory spondylopathy, lumbar region ([place]) 10/04/2018  . Abnormal MRI, lumbar spine (03/28/2017) 07/04/2017  . Abnormal MRI, cervical spine (03/28/2017) 07/04/2017  . Lumbar facet syndrome (Bilateral) (L>R) 04/26/2017  . Lumbar facet hypertrophy (multilevel) (Bilateral) 04/26/2017  . Long term current use of anticoagulant therapy 04/26/2017  . Neurogenic pain 04/26/2017  . Musculoskeletal pain 04/26/2017  . Chronic pain syndrome 03/08/2017  . Long term prescription opiate use 03/08/2017  . Opiate use 03/08/2017  . Chronic low back pain (Primary Area of Pain) (Bilateral) (L>R) 03/08/2017  . Chronic neck pain (Secondary area of Pain) (Bilateral) (R>L) 03/08/2017  . Cervicogenic headache (Right) 03/08/2017  . Chronic upper back pain (Third area of Pain) (Bilateral) (R>L) 03/08/2017  . Chronic hip pain (Bilateral) (L>R) 03/08/2017  . Osteoarthritis of hip (Bilateral) (L>R) 03/08/2017  . Cervical central spinal stenosis 03/08/2017  . Cervical spondylosis with radiculopathy (Right) (C5) 03/08/2017  . Lumbar spondylosis 03/08/2017  .  Atherosclerosis of native arteries of extremity with intermittent claudication ([place]) 02/22/2017  . Gout 02/16/2016  . Ankle pain 11/17/2015  . Arthritis 05/14/2015  . Carotid artery narrowing 05/14/2015  . Claudication ([place]) 05/14/2015  . CAFL (chronic airflow limitation) ([place]) 05/14/2015  . Clinical depression 05/14/2015  . Acid reflux 05/14/2015  . Urinary incontinence 05/14/2015  . Pins and needles sensation 05/14/2015  . Obstructive apnea 05/14/2015  . Morbid obesity ([place]) 05/14/2015  . History of abnormal cervical Papanicolaou smear 05/14/2015  . Peripheral blood vessel disorder ([place]) 05/14/2015  . B12 deficiency 05/14/2015  . Vitamin D insufficiency 05/14/2015  . AAA (abdominal aortic aneurysm) without rupture ([place]) 05/26/2014  . Aortic heart valve narrowing 01/31/2013  . Malaise and fatigue 09/26/2012  . CAD in native artery 03/17/2009  . Diabetes mellitus, type 2 ([place]) 02/05/2009  . Hypercholesteremia 06/24/2008  . Allergic rhinitis 10/25/2007  . Airway hyperreactivity 10/25/2007  . Smoking greater than 30 pack years 10/25/2007  . Narrowing of intervertebral disc space 10/25/2007  . Benign essential HTN 10/25/2007  . Arthritis, degenerative 10/25/2007    Alanson Puls, PT DPT 04/07/2020, 10:59 AM  [place] MAIN [facility] SERVICES [address] [place], [place], [address] Phone: [phone]   Fax:  [phone]  Name: Laura Nielsen MRN: [id number] Date of Birth: 13-Jun-1956

## 2020-04-08 ENCOUNTER — Other Ambulatory Visit: Payer: Self-pay | Admitting: Family Medicine

## 2020-04-08 DIAGNOSIS — I10 Essential (primary) hypertension: Secondary | ICD-10-CM

## 2020-04-13 ENCOUNTER — Ambulatory Visit: Payer: Medicare Other | Admitting: Physical Therapy

## 2020-04-13 ENCOUNTER — Other Ambulatory Visit: Payer: Self-pay

## 2020-04-13 ENCOUNTER — Encounter: Payer: Self-pay | Admitting: Physical Therapy

## 2020-04-13 DIAGNOSIS — M6281 Muscle weakness (generalized): Secondary | ICD-10-CM

## 2020-04-13 DIAGNOSIS — R2689 Other abnormalities of gait and mobility: Secondary | ICD-10-CM | POA: Diagnosis not present

## 2020-04-13 DIAGNOSIS — R262 Difficulty in walking, not elsewhere classified: Secondary | ICD-10-CM | POA: Diagnosis not present

## 2020-04-13 NOTE — Patient Instructions (Signed)
  Feet Apart, Varied Arm Positions - Eyes Closed   Stand in corner (try to not lean on wall) with chair out in front of you for safety. Stand with feet shoulder width apart and arms by side. Stand with eyes open, working on keeping your balance, then Close eyes and visualize upright position. Hold __10__ seconds.  Repeat alternating between eyes open/closed; As this gets easier, try with feet close together.  Repeat __5__ times per session. Do _2___ sessions per day.  Copyright  VHI. All rights reserved.  Feet Apart, Head Motion - Eyes open  Stand in corner (try to not lean on wall) with chair out in front of you for safety. With eyes open and feet shoulder width apart, move head slowly, up and down 5 times, Then try with eyes closed and move head side/side x5 times. Repeat _2___ times each direction per session. Do __2__ sessions per day.   Make sure that you practice good safety with having objects nearby to grab ahold of if you start to loose your balance.  If you start to get dizzy, please stop and take a rest break.

## 2020-04-13 NOTE — Therapy (Signed)
[place] MAIN [facility] SERVICES [address] [place], [place], [address] Phone: [phone]   Fax:  [phone]  Physical Therapy Treatment  Patient Details  Name: Laura Nielsen MRN: [id number] Date of Birth: Jul 31, 1956 Referring Provider (PT): shah Hemang   Encounter Date: 04/13/2020  PT End of Session - 04/13/20 1222    Visit Number  15    Number of Visits  25    Date for PT Re-Evaluation  05/06/20    PT Start Time  1055    PT Stop Time  1142    PT Time Calculation (min)  47 min    Equipment Utilized During Treatment  Gait belt    Activity Tolerance  Patient limited by pain    Behavior During Therapy  WFL for tasks assessed/performed       Past Medical History:  Diagnosis Date  . Allergy   . Arthritis   . Asthma   . Depression   . Diabetes mellitus without complication ([place])   . Emphysema of lung ([place])   . GERD (gastroesophageal reflux disease)   . Hyperlipidemia   . Hypertension   . Osteoporosis   . Tobacco abuse counseling     Past Surgical History:  Procedure Laterality Date  . Cardiac catheterization  3/210   70-80% stenosis RCA stent placed. started on Plavix  . [place]  . TUBAL LIGATION    . VAGINAL HYSTERECTOMY     Menometrorrhagia. Excessive bleeding. Unknown if cervix removed.   . vascular stent  03/28/2011   Dr. Delana Meyer, [facility]; Infrarenal    There were no vitals filed for this visit.  Subjective Assessment - 04/13/20 1108    Subjective  Patient reports moderate back pain. She states, "if possible, I would like to try and decrease to 1x a week to help conserve gas"    Pertinent History  Patient began having symprtoms of weakness, and poor balance and difficulty finding words 5 years ago. She walks without any device. She has had 1 fall and landed on the bed 1 year ago.    Limitations  Standing;Walking    How long can you sit comfortably?  unlimited    How long can you stand comfortably?  5 mins     How long can you walk comfortably?  40 feet    Patient Stated Goals  to walk better    Currently in Pain?  Yes    Pain Score  5     Pain Location  Back    Pain Orientation  Lower    Pain Descriptors / Indicators  Aching    Pain Type  Chronic pain    Pain Onset  More than a month ago    Pain Frequency  Constant    Aggravating Factors   unsure    Pain Relieving Factors  rest/heat    Effect of Pain on Daily Activities  decreased activity tolerance;    Multiple Pain Sites  No           Patient instructed in advanced balance exercise  Standing in parallel bars:  Standing on airex foam: Feet apart:  Eyes open/closed 10 sec hold x3 sets with immediate loss of balance requiring min A for safety  Eyes open: head turns side/side x5 reps, up/down x5 reps each;  Required min A for safety with cues for gaze stabilization for better stance control;   Stepping over orange hurdle:  Forward/backward with 2-1 rail assist x10  reps  Side step with 2-1 rail assist x10 reps each direction Required mod VCs to increase hip flexion and increase step length for better foot clearance Required intermittent HHA for balance control; had increased difficulty with stepping over obstacles.   In parallel bars: -tandem gait on airex beam x4 laps with intermittent fingertip hold for safety; -side stepping on airex beam, x2 laps each direction with fingertip hold for safety;   Patient tolerated session fair. She does fatigue quickly requiring short rest breaks. Patient exhibits increased unsteadiness when standing on compliant surfaces and had significant difficulty with eyes closed. Advanced HEP with written instruction for better adherence;                        PT Education - 04/13/20 1216    Education Details  HEP, balance    Person(s) Educated  Patient    Methods  Explanation;Verbal cues    Comprehension  Verbalized understanding;Returned demonstration;Verbal cues  required;Need further instruction       PT Short Term Goals - 03/16/20 1333      PT SHORT TERM GOAL #1   Title  Patient will be independent in home exercise program to improve strength/mobility for better functional independence with ADLs.    Baseline  4/13: doing some everday    Time  4    Period  Weeks    Status  Achieved    Target Date  02/12/20      PT SHORT TERM GOAL #2   Title  Patient (> 53 years old) will complete five times sit to stand test in < 15 seconds indicating an increased LE strength and improved balance.    Baseline  4/13: 15.2 sec    Time  4    Period  Weeks    Status  Partially Met    Target Date  02/12/20        PT Long Term Goals - 04/01/20 1308      PT LONG TERM GOAL #1   Title  Patient will increase Berg Balance score by > 6 points to demonstrate decreased fall risk during functional activities    Time  8    Period  Weeks    Status  Partially Met    Target Date  05/06/20      PT LONG TERM GOAL #2   Title  Patient will increase six minute walk test distance to >1000 for progression to community ambulator and improve gait ability    Time  8    Period  Weeks    Status  Partially Met    Target Date  05/06/20      PT LONG TERM GOAL #3   Title  Patient will increase 10 meter walk test to >1.66ms as to improve gait speed for better community ambulation and to reduce fall risk.    Time  8    Period  Weeks    Status  Partially Met    Target Date  05/06/20            Plan - 04/13/20 1222    Clinical Impression Statement  Patient motivated and participated well within session. She does exhibit increased difficulty with static stance especially while on compliant surface with eyes closed. Advanced HEP with balance exercises. educated patient in ways to improve safety awareness with balance exercise. She does fatigue quickly requiring short seated rest breaks. Patient would benefit from additional skilled PT intervention to improve strength, balance  and mobility;    Personal Factors and Comorbidities  Comorbidity 1;Comorbidity 2    Comorbidities  diabetes,CHF    Examination-Activity Limitations  Bathing;Locomotion Level;Stand;Bed Mobility    Examination-Participation Restrictions  Cleaning;Community Activity    Stability/Clinical Decision Making  Stable/Uncomplicated    Rehab Potential  Fair    PT Frequency  2x / week    PT Duration  8 weeks    PT Treatment/Interventions  Therapeutic activities;Therapeutic exercise;Balance training;Neuromuscular re-education;Patient/family education;Gait training;Moist Heat;Electrical Stimulation;Manual techniques    PT Next Visit Plan  balance and strengthening    Consulted and Agree with Plan of Care  Patient       Patient will benefit from skilled therapeutic intervention in order to improve the following deficits and impairments:  Abnormal gait, Decreased endurance, Decreased range of motion, Decreased strength, Pain, Impaired sensation, Dizziness, Decreased mobility, Difficulty walking, Obesity  Visit Diagnosis: Muscle weakness (generalized)  Difficulty in walking, not elsewhere classified  Other abnormalities of gait and mobility     Problem List Patient Active Problem List   Diagnosis Date Noted  . Aortic atherosclerosis ([place]) 07/17/2019  . Tobacco abuse 01/10/2019  . Unspecified inflammatory spondylopathy, lumbar region ([place]) 10/04/2018  . Abnormal MRI, lumbar spine (03/28/2017) 07/04/2017  . Abnormal MRI, cervical spine (03/28/2017) 07/04/2017  . Lumbar facet syndrome (Bilateral) (L>R) 04/26/2017  . Lumbar facet hypertrophy (multilevel) (Bilateral) 04/26/2017  . Long term current use of anticoagulant therapy 04/26/2017  . Neurogenic pain 04/26/2017  . Musculoskeletal pain 04/26/2017  . Chronic pain syndrome 03/08/2017  . Long term prescription opiate use 03/08/2017  . Opiate use 03/08/2017  . Chronic low back pain (Primary Area of Pain) (Bilateral) (L>R) 03/08/2017  . Chronic  neck pain (Secondary area of Pain) (Bilateral) (R>L) 03/08/2017  . Cervicogenic headache (Right) 03/08/2017  . Chronic upper back pain (Third area of Pain) (Bilateral) (R>L) 03/08/2017  . Chronic hip pain (Bilateral) (L>R) 03/08/2017  . Osteoarthritis of hip (Bilateral) (L>R) 03/08/2017  . Cervical central spinal stenosis 03/08/2017  . Cervical spondylosis with radiculopathy (Right) (C5) 03/08/2017  . Lumbar spondylosis 03/08/2017  . Atherosclerosis of native arteries of extremity with intermittent claudication ([place]) 02/22/2017  . Gout 02/16/2016  . Ankle pain 11/17/2015  . Arthritis 05/14/2015  . Carotid artery narrowing 05/14/2015  . Claudication ([place]) 05/14/2015  . CAFL (chronic airflow limitation) ([place]) 05/14/2015  . Clinical depression 05/14/2015  . Acid reflux 05/14/2015  . Urinary incontinence 05/14/2015  . Pins and needles sensation 05/14/2015  . Obstructive apnea 05/14/2015  . Morbid obesity ([place]) 05/14/2015  . History of abnormal cervical Papanicolaou smear 05/14/2015  . Peripheral blood vessel disorder ([place]) 05/14/2015  . B12 deficiency 05/14/2015  . Vitamin D insufficiency 05/14/2015  . AAA (abdominal aortic aneurysm) without rupture ([place]) 05/26/2014  . Aortic heart valve narrowing 01/31/2013  . Malaise and fatigue 09/26/2012  . CAD in native artery 03/17/2009  . Diabetes mellitus, type 2 ([place]) 02/05/2009  . Hypercholesteremia 06/24/2008  . Allergic rhinitis 10/25/2007  . Airway hyperreactivity 10/25/2007  . Smoking greater than 30 pack years 10/25/2007  . Narrowing of intervertebral disc space 10/25/2007  . Benign essential HTN 10/25/2007  . Arthritis, degenerative 10/25/2007    Arisbel Maione PT, DPT 04/13/2020, 12:29 PM  [place] MAIN [facility] SERVICES [address] [place], [place], [address] Phone: [phone]   Fax:  [phone]  Name: Laura Nielsen MRN: [id number] Date of Birth: 1956/10/02

## 2020-04-15 ENCOUNTER — Ambulatory Visit: Payer: Medicare Other | Admitting: Physical Therapy

## 2020-04-15 ENCOUNTER — Telehealth: Payer: Self-pay | Admitting: Family Medicine

## 2020-04-15 NOTE — Chronic Care Management (AMB) (Signed)
  Chronic Care Management   Note  04/15/2020 Name: Laura Nielsen MRN: [id number] DOB: 1955/12/24  Laura Nielsen is a 64 y.o. year old female who is a primary care patient of Caryn Section, Kirstie Peri, MD. I reached out to [name] by phone today in response to a referral sent by Laura Nielsen's health plan.     Laura Nielsen was given information about Chronic Care Management services today including:  1. CCM service includes personalized support from designated clinical staff supervised by her physician, including individualized plan of care and coordination with other care providers 2. 24/7 contact phone numbers for assistance for urgent and routine care needs. 3. Service will only be billed when office clinical staff spend 20 minutes or more in a month to coordinate care. 4. Only one practitioner may furnish and bill the service in a calendar month. 5. The patient may stop CCM services at any time (effective at the end of the month) by phone call to the office staff. 6. The patient will be responsible for cost sharing (co-pay) of up to 20% of the service fee (after annual deductible is met).  Patient did not agree to enrollment in care management services and does not wish to consider at this time.  Follow up plan: The patient has been provided with contact information for the care management team and has been advised to call with any health related questions or concerns.   Laura Nielsen, Laura Nielsen, Laura Nielsen, Laura Nielsen [id number] Direct Dial: [phone] [email or web address] Website: [email or web address]

## 2020-04-19 NOTE — Progress Notes (Signed)
I,Roshena L Chambers,acting as a scribe for Lelon Huh, MD.,have documented all relevant documentation on the behalf of Lelon Huh, MD,as directed by  Lelon Huh, MD while in the presence of Lelon Huh, MD.   Established patient visit   Patient: Laura Nielsen   DOB: 1956-03-18   64 y.o. Female  MRN: [id number] Visit Date: 04/20/2020  Today's healthcare provider: Lelon Huh, MD   Chief Complaint  Patient presents with  . Diabetes  . Hyperlipidemia  . Allergies  . Obesity  . Vitamin D and B12 deficiency   Subjective    HPI Diabetes Mellitus Type II, Follow-up  Lab Results  Component Value Date   HGBA1C 6.1 (A) 06/10/2019   HGBA1C 6.0 (H) 02/12/2019   HGBA1C 6.2 (A) 10/04/2018   Wt Readings from Last 3 Encounters:  04/20/20 254 lb (115.2 kg)  06/10/19 254 lb (115.2 kg)  05/05/19 255 lb (115.7 kg)   Last seen for diabetes 3 months ago.  Management since then includes no changes. She reports good compliance with treatment. She has tolerated Ozempic 0.5mg  dose well.  She is not having side effects.  Symptoms: Yes fatigue No foot ulcerations  No appetite changes No nausea  No paresthesia of the feet  Yes polydipsia  No polyuria No visual disturbances   No vomiting     Home blood sugar records: blood sugars are not checked at home  Episodes of hypoglycemia? No    Current insulin regiment: none Most Recent Eye Exam: not UTD Current exercise: none   Pertinent Labs: Lab Results  Component Value Date   CHOL 138 02/12/2019   HDL 34 (L) 02/12/2019   LDLCALC 73 02/12/2019   TRIG 157 (H) 02/12/2019   CHOLHDL 4.1 02/12/2019   Lab Results  Component Value Date   NA 143 02/12/2019   K 3.9 02/12/2019   CREATININE 0.60 11/14/2019   GFRNONAA 96 02/12/2019   GFRAA 111 02/12/2019   GLUCOSE 108 (H) 02/12/2019     --------------------------------------------------------------------------------------------------- Hypertension, follow-up  BP  Readings from Last 3 Encounters:  04/20/20 132/74  06/10/19 106/76  05/05/19 140/81   Wt Readings from Last 3 Encounters:  04/20/20 254 lb (115.2 kg)  06/10/19 254 lb (115.2 kg)  05/05/19 255 lb (115.7 kg)     She was last seen for hypertension 1 years ago.  BP at that visit was 112/68. Management since that visit includes continue same medication. She reports good compliance with treatment. She is not having side effects.  She is following a Regular diet. She is not exercising. She does smoke.  Use of agents associated with hypertension: NSAIDS.   Outside blood pressures are not checked. Symptoms: No chest pain No chest pressure  No palpitations No syncope  Yes dyspnea No orthopnea  No paroxysmal nocturnal dyspnea No lower extremity edema    The 10-year ASCVD risk score Mikey Bussing DC Jr., et al., 2013) is: 22.7%   --------------------------------------------------------------------------------------------------- Lipid/Cholesterol, Follow-up  Last lipid panel Other pertinent labs  Lab Results  Component Value Date   CHOL 138 02/12/2019   HDL 34 (L) 02/12/2019   LDLCALC 73 02/12/2019   TRIG 157 (H) 02/12/2019   CHOLHDL 4.1 02/12/2019   Lab Results  Component Value Date   ALT 19 02/12/2019   AST 21 02/12/2019   PLT 258 02/12/2019   TSH 1.86 08/30/2017     She was last seen for this 1 years ago.  Management since that visit includes continuing same medications.  She  reports good compliance with treatment. She is not having side effects.   Symptoms: No chest pain No chest pressure/discomfort  Yes dyspnea Yes lower extremity edema  No numbness or tingling of extremity No orthopnea  No palpitations No paroxysmal nocturnal dyspnea  No speech difficulty No syncope   Current diet: well balanced Current exercise: none  The 10-year ASCVD risk score Mikey Bussing DC Jr., et al., 2013) is:  22.7%  --------------------------------------------------------------------------------------------------- Allergies: Patient reports having year round allergy flare ups. She has been taking Claritin and Flonase without much relief.   Also needs follow up for vitamin D deficiency found during workup for gait instability by her neurologist.  Lab Results  Component Value Date   VD25OH 25.2 (L) 02/12/2019   she completely course of high dose weekly vitamin d and states she is now on 1000 units daily and tolerated well.    She was also found to be B12 deficient and had weekly shots for about a month, and has since just been taking OTC b12 supplements which she is tolerating well.   Follow up obesity: She reports she had significant weight loss after starting Ozempic for diabetes, and is tolerating medications well. States she is not eating as much, although weight on our scales is just a few pounds below her baseline.   She also states she is having more trouble with rushing sounds and cricket sounds in both her ears which has been getting progressively worse for several years. She also feels she is having more trouble hearing. She is wondering if this has anything to do with her trouble with balance, which she is Dr. Brigitte Pulse.  Medications: Outpatient Medications Prior to Visit  Medication Sig  . albuterol (PROVENTIL HFA) 108 (90 Base) MCG/ACT inhaler Inhale 2 puffs into the lungs every 6 (six) hours as needed for wheezing or shortness of breath.  . allopurinol (ZYLOPRIM) 100 MG tablet Take 1 tablet (100 mg total) by mouth daily.  Marland Kitchen aspirin 81 MG tablet Take 81 mg by mouth daily.   . bisoprolol (ZEBETA) 10 MG tablet Take 1 tablet (10 mg total) by mouth daily.  . clopidogrel (PLAVIX) 75 MG tablet Take 1 tablet (75 mg total) by mouth daily.  . cyanocobalamin (,VITAMIN B-12,) 1000 MCG/ML injection Inject 52ml IM once a week for four weeks then once a month for four months.  . cyanocobalamin 1000  MCG tablet Take 1,000 mcg by mouth daily.  Marland Kitchen gabapentin (NEURONTIN) 300 MG capsule Take 1 capsule (300 mg total) by mouth 2 (two) times daily AND 2 capsules (600 mg total) at bedtime. Take 1 capsule (300 mg total) by mouth 3 (three) times daily.. (Patient taking differently: Take 1 capsule (300 mg total) by mouth 2 (two) times daily AND 2 capsules (600 mg total) at bedtime.)  . Ibuprofen 200 MG CAPS Take by mouth.   Marland Kitchen lisinopril-hydrochlorothiazide (ZESTORETIC) 20-12.5 MG tablet Take 1 tablet by mouth daily.  Marland Kitchen loratadine (CLARITIN) 10 MG tablet Take 10 mg by mouth daily as needed.   Marland Kitchen OZEMPIC, 0.25 OR 0.5 MG/DOSE, 2 MG/1.5ML SOPN Inject 0.5 mg into the skin once a week.  . rosuvastatin (CRESTOR) 20 MG tablet Take 1 tablet (20 mg total) by mouth daily.  Marland Kitchen oxyCODONE (ROXICODONE) 5 MG immediate release tablet Take 1 tablet (5 mg total) by mouth daily as needed for severe pain.   No facility-administered medications prior to visit.    Review of Systems  Constitutional: Positive for fatigue. Negative for appetite change, chills  and fever.  Respiratory: Positive for shortness of breath (unchanged from baseline). Negative for chest tightness.   Cardiovascular: Positive for leg swelling (left ankle). Negative for chest pain and palpitations.  Gastrointestinal: Negative for abdominal pain, nausea and vomiting.  Endocrine: Positive for polydipsia.  Neurological: Negative for dizziness and weakness.      Objective    BP 132/74 (BP Location: Left Arm, Patient Position: Sitting, Cuff Size: Large)   Pulse 67   Temp (!) 96.8 F (36 C) (Temporal)   Resp 16   Wt 254 lb (115.2 kg)   SpO2 97% Comment: room air  BMI 38.62 kg/m    Physical Exam   General: Appearance:    Obese female in no acute distress  Eyes:    PERRL, conjunctiva/corneas clear, EOM's intact       Lungs:     Clear to auscultation bilaterally, respirations unlabored  Heart:    Normal heart rate. Normal rhythm.  2/6 harsh,  crescendo-decrescendo, systolic murmur at right upper sternal border  MS:   All extremities are intact.   Neurologic:   Awake, alert, oriented x 3. No apparent focal neurological           defect.        No results found for any visits on 04/20/20.  Assessment & Plan     1. Type 2 diabetes mellitus without complication, without long-term current use of insulin ([place]) Doing well with Ozempic. Check - HgB A1c  2. Hypercholesteremia She previously stopped atorvastatin due to increase in blood sugar. Consider trial of a different  tatin after reviewing labs.  - Lipid panel - Comprehensive metabolic panel - CBC  3. Benign essential HTN Stable. Continue current medications.  4. Recurrent major depressive disorder, in partial remission ([place]) She states that the 300mg  XL bupropion worked well in the past, but did cause some nausea. Will restart at lower dose and titrated - buPROPion (WELLBUTRIN XL) 150 MG 24 hr tablet; Take 1 tablet (150 mg total) by mouth daily. For 8 days, then increase to 2 daily  Dispense: 30 tablet; Refill: 0  5. Unspecified inflammatory spondylopathy, lumbar region [facility]) Relatively stable.   6. CAFL (chronic airflow limitation) ([facility]) Continue current inhalers and strongly encouraged to stop smoking.   7. AAA (abdominal aortic aneurysm) without rupture [facility]) S/p endovascular repair. Last seen by Dr. Delana Meyer in March 2019. Is due for follow surveillance ultrasound, as well as follow up carotid artery stenosis and PVD.   8. Morbid obesity ([place]) She feels Ozempic has been helpful, but weight stable on our scale. Anticipate increase to 1mg  before next refill due the first week of June.   9. Vitamin D insufficiency - VITAMIN D 25 Hydroxy (Vit-D Deficiency, Fractures)  10. B12 deficiency - Vitamin B12 - cyanocobalamin ((VITAMIN B-12)) injection 1,000 mcg  11. Smoking greater than 30 pack years Quick smoking. LDCT in October.  - buPROPion (WELLBUTRIN XL) 150 MG 24  hr tablet; Take 1 tablet (150 mg total) by mouth daily. For 8 days, then increase to 2 daily  Dispense: 30 tablet; Refill: 0  12. Tinnitus of both ears Ongoing problem for the past 5 years and increasingly bothersome. Doubt this is related to her difficulty with balance which Dr. Manuella Ghazi has attributed to neuropathy. Will refer to ENT.  - Ambulatory referral to ENT  No follow-ups on file.      The entirety of the information documented in the History of Present Illness, Review of Systems and Physical  Exam were personally obtained by me. Portions of this information were initially documented by the CMA and reviewed by me for thoroughness and accuracy.    Addressed extensive list of chronic and acute medical problems today requiring 55 minutes reviewing her medical record, counseling patient regarding her conditions and coordination of care.     Lelon Huh, MD  [facility] [phone] (phone) [phone] (fax)  [place]

## 2020-04-20 ENCOUNTER — Ambulatory Visit: Payer: Medicare Other | Admitting: Physical Therapy

## 2020-04-20 ENCOUNTER — Other Ambulatory Visit: Payer: Self-pay

## 2020-04-20 ENCOUNTER — Encounter: Payer: Self-pay | Admitting: Family Medicine

## 2020-04-20 ENCOUNTER — Ambulatory Visit (INDEPENDENT_AMBULATORY_CARE_PROVIDER_SITE_OTHER): Payer: Medicare Other | Admitting: Family Medicine

## 2020-04-20 VITALS — BP 132/74 | HR 67 | Temp 96.8°F | Resp 16 | Wt 254.0 lb

## 2020-04-20 DIAGNOSIS — I714 Abdominal aortic aneurysm, without rupture, unspecified: Secondary | ICD-10-CM

## 2020-04-20 DIAGNOSIS — E7800 Pure hypercholesterolemia, unspecified: Secondary | ICD-10-CM

## 2020-04-20 DIAGNOSIS — F3341 Major depressive disorder, recurrent, in partial remission: Secondary | ICD-10-CM | POA: Diagnosis not present

## 2020-04-20 DIAGNOSIS — E559 Vitamin D deficiency, unspecified: Secondary | ICD-10-CM | POA: Diagnosis not present

## 2020-04-20 DIAGNOSIS — J449 Chronic obstructive pulmonary disease, unspecified: Secondary | ICD-10-CM | POA: Diagnosis not present

## 2020-04-20 DIAGNOSIS — H9313 Tinnitus, bilateral: Secondary | ICD-10-CM | POA: Diagnosis not present

## 2020-04-20 DIAGNOSIS — E538 Deficiency of other specified B group vitamins: Secondary | ICD-10-CM | POA: Diagnosis not present

## 2020-04-20 DIAGNOSIS — E119 Type 2 diabetes mellitus without complications: Secondary | ICD-10-CM

## 2020-04-20 DIAGNOSIS — M4696 Unspecified inflammatory spondylopathy, lumbar region: Secondary | ICD-10-CM

## 2020-04-20 DIAGNOSIS — I10 Essential (primary) hypertension: Secondary | ICD-10-CM | POA: Diagnosis not present

## 2020-04-20 DIAGNOSIS — F17210 Nicotine dependence, cigarettes, uncomplicated: Secondary | ICD-10-CM | POA: Diagnosis not present

## 2020-04-20 MED ORDER — CYANOCOBALAMIN 1000 MCG/ML IJ SOLN
1000.0000 ug | Freq: Once | INTRAMUSCULAR | Status: AC
  Administered 2020-04-20: 1000 ug via INTRAMUSCULAR

## 2020-04-20 MED ORDER — BUPROPION HCL ER (XL) 150 MG PO TB24: 150.0000 mg | ORAL_TABLET | Freq: Every day | ORAL | 0 refills | Status: DC

## 2020-04-20 MED ORDER — MONTELUKAST SODIUM 10 MG PO TABS: 10.0000 mg | ORAL_TABLET | Freq: Every day | ORAL | 1 refills | Status: DC

## 2020-04-20 NOTE — Patient Instructions (Addendum)
.   Please review the attached list of medications and notify my office if there are any errors.   . Please bring all of your medications to every appointment so we can make sure that our medication list is the same as yours.    Please stop smoking   Let me know when you are ready to stop smoking and we can send in a prescription for bupropion or Chantix

## 2020-04-21 LAB — COMPREHENSIVE METABOLIC PANEL
ALT: 29 IU/L (ref 0–32)
AST: 31 IU/L (ref 0–40)
Albumin/Globulin Ratio: 1.7 (ref 1.2–2.2)
Albumin: 4.3 g/dL (ref 3.8–4.8)
Alkaline Phosphatase: 106 IU/L (ref 48–121)
BUN/Creatinine Ratio: 16 (ref 12–28)
BUN: 11 mg/dL (ref 8–27)
Bilirubin Total: 0.5 mg/dL (ref 0.0–1.2)
CO2: 20 mmol/L (ref 20–29)
Calcium: 9.7 mg/dL (ref 8.7–10.3)
Chloride: 102 mmol/L (ref 96–106)
Creatinine, Ser: 0.67 mg/dL (ref 0.57–1.00)
GFR calc Af Amer: 108 mL/min/{1.73_m2} (ref >59)
GFR calc non Af Amer: 94 mL/min/{1.73_m2} (ref >59)
Globulin, Total: 2.6 g/dL (ref 1.5–4.5)
Glucose: 91 mg/dL (ref 65–99)
Potassium: 3.8 mmol/L (ref 3.5–5.2)
Sodium: 141 mmol/L (ref 134–144)
Total Protein: 6.9 g/dL (ref 6.0–8.5)

## 2020-04-21 LAB — CBC
Hematocrit: 49.3 % — ABNORMAL HIGH (ref 34.0–46.6)
Hemoglobin: 16.5 g/dL — ABNORMAL HIGH (ref 11.1–15.9)
MCH: 28.9 pg (ref 26.6–33.0)
MCHC: 33.5 g/dL (ref 31.5–35.7)
MCV: 87 fL (ref 79–97)
Platelets: 243 10*3/uL (ref 150–450)
RBC: 5.7 x10E6/uL — ABNORMAL HIGH (ref 3.77–5.28)
RDW: 13.8 % (ref 11.7–15.4)
WBC: 10 10*3/uL (ref 3.4–10.8)

## 2020-04-21 LAB — HEMOGLOBIN A1C
Est. average glucose Bld gHb Est-mCnc: 126 mg/dL
Hgb A1c MFr Bld: 6 % — ABNORMAL HIGH (ref 4.8–5.6)

## 2020-04-21 LAB — LIPID PANEL
Chol/HDL Ratio: 3.5 ratio (ref 0.0–4.4)
Cholesterol, Total: 126 mg/dL (ref 100–199)
HDL: 36 mg/dL — ABNORMAL LOW (ref >39)
LDL Chol Calc (NIH): 66 mg/dL (ref 0–99)
Triglycerides: 136 mg/dL (ref 0–149)
VLDL Cholesterol Cal: 24 mg/dL (ref 5–40)

## 2020-04-21 LAB — VITAMIN B12: Vitamin B-12: >2000 pg/mL — ABNORMAL HIGH (ref 232–1245)

## 2020-04-21 LAB — VITAMIN D 25 HYDROXY (VIT D DEFICIENCY, FRACTURES): Vit D, 25-Hydroxy: 42.3 ng/mL (ref 30.0–100.0)

## 2020-04-22 ENCOUNTER — Ambulatory Visit: Payer: Medicare Other | Admitting: Physical Therapy

## 2020-04-22 ENCOUNTER — Telehealth: Payer: Self-pay

## 2020-04-22 NOTE — Telephone Encounter (Signed)
Patient advised. Follow up scheduled for 09/03/20

## 2020-04-22 NOTE — Telephone Encounter (Signed)
-----   Message from Birdie Sons, MD sent at 04/22/2020  8:12 AM EDT ----- A1c is stable at 6.0. cholesterol is well controlled at 126. Vitamin d levels are good.b12 levels are good. Continue current medications.   Please schedule follow up for diabetes in 4-5 months.

## 2020-04-27 ENCOUNTER — Ambulatory Visit: Payer: Federal, State, Local not specified - PPO | Admitting: Physical Therapy

## 2020-04-29 ENCOUNTER — Ambulatory Visit: Payer: Medicare Other | Admitting: Physical Therapy

## 2020-04-29 ENCOUNTER — Encounter: Payer: Self-pay | Admitting: Physical Therapy

## 2020-04-29 ENCOUNTER — Other Ambulatory Visit: Payer: Self-pay

## 2020-04-29 DIAGNOSIS — M6281 Muscle weakness (generalized): Secondary | ICD-10-CM | POA: Diagnosis not present

## 2020-04-29 DIAGNOSIS — R2689 Other abnormalities of gait and mobility: Secondary | ICD-10-CM

## 2020-04-29 DIAGNOSIS — R262 Difficulty in walking, not elsewhere classified: Secondary | ICD-10-CM | POA: Diagnosis not present

## 2020-04-29 NOTE — Therapy (Signed)
[place] MAIN [facility] SERVICES [address] [place], [place], [address] Phone: [phone]   Fax:  [phone]  Physical Therapy Treatment  Patient Details  Name: Laura Nielsen MRN: [id number] Date of Birth: 13-Dec-1955 Referring Provider (PT): shah Hemang   Encounter Date: 04/29/2020  PT End of Session - 04/29/20 1114    Visit Number  16    Number of Visits  25    Date for PT Re-Evaluation  05/06/20    PT Start Time  1102    PT Stop Time  1145    PT Time Calculation (min)  43 min    Equipment Utilized During Treatment  Gait belt    Activity Tolerance  Patient limited by pain    Behavior During Therapy  [facility] for tasks assessed/performed       Past Medical History:  Diagnosis Date  . Allergy   . Arthritis   . Asthma   . Depression   . Diabetes mellitus without complication ([place])   . Emphysema of lung ([place])   . GERD (gastroesophageal reflux disease)   . Hyperlipidemia   . Hypertension   . Osteoporosis   . Tobacco abuse counseling     Past Surgical History:  Procedure Laterality Date  . Cardiac catheterization  3/210   70-80% stenosis RCA stent placed. started on Plavix  . [place]  . TUBAL LIGATION    . VAGINAL HYSTERECTOMY     Menometrorrhagia. Excessive bleeding. Unknown if cervix removed.   . vascular stent  03/28/2011   Dr. Delana Meyer, [facility]; Infrarenal    There were no vitals filed for this visit.  Subjective Assessment - 04/29/20 1107    Subjective  She reports increased soreness in back after sitting on bleachers at her granddaughter's graduation;    Pertinent History  Patient began having symprtoms of weakness, and poor balance and difficulty finding words 5 years ago. She walks without any device. She has had 1 fall and landed on the bed 1 year ago.    Limitations  Standing;Walking    How long can you sit comfortably?  unlimited    How long can you stand comfortably?  5 mins    How long can you walk  comfortably?  40 feet    Patient Stated Goals  to walk better    Currently in Pain?  Yes    Pain Score  5     Pain Location  Back    Pain Orientation  Lower    Pain Descriptors / Indicators  Aching    Pain Type  Chronic pain    Pain Onset  More than a month ago    Pain Frequency  Intermittent    Aggravating Factors   sitting on bleachers    Pain Relieving Factors  rest/heat    Effect of Pain on Daily Activities  decreased activity tolerance;    Multiple Pain Sites  No        TREATMENT: Patient hooklying with moist heat to low back: -lumbar trunk rotation x10 reps; -single knee to chest stretch 15 sec hold x2 reps each LE;  -posterior pelvic rock x10 reps; -bridges with arms by side x10 reps; Patient required min-moderate verbal/tactile cues for correct exercise technique.   Patient instructed in advanced balance exercise   Standing beside steps:   Standing on airex foam: Feet together:             Eyes open/closed 10 sec hold x3  sets with immediate loss of balance requiring min A for safety Standing on airex: Modified tandem stance:   Unsupported standing 10 sec hold x1 rep each foot in front;              Eyes open: head turns side/side x5 reps,  Alternate UE lift x5 reps each UE Required min A for safety with cues for gaze stabilization for better stance control;    Standing on airex: -alternate toe taps to 4 inch step with 1-0 rail assist x15 reps each LE with CGA for safety; -heel raises unsupported x10 reps with cues to keep knee straight for better ankle strengthening and balance challenge;      Patient tolerated session fair. She reports increased back pain at start of session; PT instructed patient in LE stretches concurrent with moist heat to low back to facilitate improved flexibility for better standing tolerance; She does fatigue quickly requiring short rest breaks. Patient exhibits increased unsteadiness when standing on compliant surfaces and had  significant difficulty with eyes closed. Reinforced HEP;                         PT Education - 04/29/20 1114    Education Details  back stretches, HEP, balance    Person(s) Educated  Patient    Methods  Explanation;Verbal cues    Comprehension  Verbalized understanding;Returned demonstration;Verbal cues required;Need further instruction       PT Short Term Goals - 03/16/20 1333      PT SHORT TERM GOAL #1   Title  Patient will be independent in home exercise program to improve strength/mobility for better functional independence with ADLs.    Baseline  4/13: doing some everday    Time  4    Period  Weeks    Status  Achieved    Target Date  02/12/20      PT SHORT TERM GOAL #2   Title  Patient (> 63 years old) will complete five times sit to stand test in < 15 seconds indicating an increased LE strength and improved balance.    Baseline  4/13: 15.2 sec    Time  4    Period  Weeks    Status  Partially Met    Target Date  02/12/20        PT Long Term Goals - 04/01/20 1308      PT LONG TERM GOAL #1   Title  Patient will increase Berg Balance score by > 6 points to demonstrate decreased fall risk during functional activities    Time  8    Period  Weeks    Status  Partially Met    Target Date  05/06/20      PT LONG TERM GOAL #2   Title  Patient will increase six minute walk test distance to >1000 for progression to community ambulator and improve gait ability    Time  8    Period  Weeks    Status  Partially Met    Target Date  05/06/20      PT LONG TERM GOAL #3   Title  Patient will increase 10 meter walk test to >1.46ms as to improve gait speed for better community ambulation and to reduce fall risk.    Time  8    Period  Weeks    Status  Partially Met    Target Date  05/06/20  Plan - 04/29/20 1144    Clinical Impression Statement  Patient motivated and participated well within session. She was instructed in ROM/strengthening  exercise in hooklying with moist heat to facilitate flexiblity for better standing tolerance; Patient tolerated well; She was instructed in standing balance exercise, utilizing compliant surface to challenge stance control. She was able to progress exercise wiht narrow base of support; She does require min A for safety; She would benefit from additional skilled PT intervention to improve strength, balance and gait safety;    Personal Factors and Comorbidities  Comorbidity 1;Comorbidity 2    Comorbidities  diabetes,CHF    Examination-Activity Limitations  Bathing;Locomotion Level;Stand;Bed Mobility    Examination-Participation Restrictions  Cleaning;Community Activity    Stability/Clinical Decision Making  Stable/Uncomplicated    Rehab Potential  Fair    PT Frequency  2x / week    PT Duration  8 weeks    PT Treatment/Interventions  Therapeutic activities;Therapeutic exercise;Balance training;Neuromuscular re-education;Patient/family education;Gait training;Moist Heat;Electrical Stimulation;Manual techniques    PT Next Visit Plan  balance and strengthening    Consulted and Agree with Plan of Care  Patient       Patient will benefit from skilled therapeutic intervention in order to improve the following deficits and impairments:  Abnormal gait, Decreased endurance, Decreased range of motion, Decreased strength, Pain, Impaired sensation, Dizziness, Decreased mobility, Difficulty walking, Obesity  Visit Diagnosis: Muscle weakness (generalized)  Difficulty in walking, not elsewhere classified  Other abnormalities of gait and mobility     Problem List Patient Active Problem List   Diagnosis Date Noted  . Recurrent major depressive disorder, in partial remission ([place]) 04/20/2020  . Aortic atherosclerosis ([place]) 07/17/2019  . Unspecified inflammatory spondylopathy, lumbar region ([place]) 10/04/2018  . Abnormal MRI, lumbar spine (03/28/2017) 07/04/2017  . Abnormal MRI, cervical spine (03/28/2017)  07/04/2017  . Lumbar facet syndrome (Bilateral) (L>R) 04/26/2017  . Lumbar facet hypertrophy (multilevel) (Bilateral) 04/26/2017  . Long term current use of anticoagulant therapy 04/26/2017  . Neurogenic pain 04/26/2017  . Musculoskeletal pain 04/26/2017  . Chronic pain syndrome 03/08/2017  . Long term prescription opiate use 03/08/2017  . Opiate use 03/08/2017  . Chronic low back pain (Primary Area of Pain) (Bilateral) (L>R) 03/08/2017  . Chronic neck pain (Secondary area of Pain) (Bilateral) (R>L) 03/08/2017  . Cervicogenic headache (Right) 03/08/2017  . Chronic upper back pain (Third area of Pain) (Bilateral) (R>L) 03/08/2017  . Chronic hip pain (Bilateral) (L>R) 03/08/2017  . Osteoarthritis of hip (Bilateral) (L>R) 03/08/2017  . Cervical central spinal stenosis 03/08/2017  . Cervical spondylosis with radiculopathy (Right) (C5) 03/08/2017  . Lumbar spondylosis 03/08/2017  . Atherosclerosis of native arteries of extremity with intermittent claudication ([place]) 02/22/2017  . Gout 02/16/2016  . Ankle pain 11/17/2015  . Arthritis 05/14/2015  . Carotid artery narrowing 05/14/2015  . Claudication ([place]) 05/14/2015  . CAFL (chronic airflow limitation) ([place]) 05/14/2015  . Clinical depression 05/14/2015  . Acid reflux 05/14/2015  . Urinary incontinence 05/14/2015  . Pins and needles sensation 05/14/2015  . Obstructive apnea 05/14/2015  . Morbid obesity ([place]) 05/14/2015  . History of abnormal cervical Papanicolaou smear 05/14/2015  . Peripheral blood vessel disorder ([place]) 05/14/2015  . B12 deficiency 05/14/2015  . Vitamin D insufficiency 05/14/2015  . AAA (abdominal aortic aneurysm) without rupture ([place]) 05/26/2014  . Aortic heart valve narrowing 01/31/2013  . Malaise and fatigue 09/26/2012  . CAD in native artery 03/17/2009  . Diabetes mellitus, type 2 ([place]) 02/05/2009  . Hypercholesteremia 06/24/2008  . Allergic rhinitis  10/25/2007  . Airway hyperreactivity 10/25/2007  . Smoking  greater than 30 pack years 10/25/2007  . Narrowing of intervertebral disc space 10/25/2007  . Benign essential HTN 10/25/2007  . Arthritis, degenerative 10/25/2007    Raji Glinski PT, DPT 04/29/2020, 11:45 AM  [place] MAIN [facility] SERVICES [address] [place], [place], [address] Phone: [phone]   Fax:  [phone]  Name: Geralene Afshar MRN: [id number] Date of Birth: 01-22-1956

## 2020-04-30 ENCOUNTER — Other Ambulatory Visit: Payer: Self-pay | Admitting: Family Medicine

## 2020-04-30 DIAGNOSIS — H9311 Tinnitus, right ear: Secondary | ICD-10-CM | POA: Diagnosis not present

## 2020-04-30 DIAGNOSIS — H903 Sensorineural hearing loss, bilateral: Secondary | ICD-10-CM | POA: Diagnosis not present

## 2020-04-30 DIAGNOSIS — E119 Type 2 diabetes mellitus without complications: Secondary | ICD-10-CM

## 2020-04-30 DIAGNOSIS — R42 Dizziness and giddiness: Secondary | ICD-10-CM | POA: Diagnosis not present

## 2020-04-30 MED ORDER — OZEMPIC (1 MG/DOSE) 2 MG/1.5ML ~~LOC~~ SOPN: 1.0000 mg | PEN_INJECTOR | SUBCUTANEOUS | 11 refills | Status: DC

## 2020-05-04 ENCOUNTER — Ambulatory Visit: Payer: Federal, State, Local not specified - PPO | Admitting: Physical Therapy

## 2020-05-06 ENCOUNTER — Ambulatory Visit (INDEPENDENT_AMBULATORY_CARE_PROVIDER_SITE_OTHER): Payer: Medicare Other

## 2020-05-06 ENCOUNTER — Other Ambulatory Visit: Payer: Self-pay

## 2020-05-06 ENCOUNTER — Other Ambulatory Visit (INDEPENDENT_AMBULATORY_CARE_PROVIDER_SITE_OTHER): Payer: Self-pay | Admitting: Vascular Surgery

## 2020-05-06 ENCOUNTER — Encounter (INDEPENDENT_AMBULATORY_CARE_PROVIDER_SITE_OTHER): Payer: Self-pay | Admitting: Vascular Surgery

## 2020-05-06 ENCOUNTER — Encounter: Payer: Self-pay | Admitting: Physical Therapy

## 2020-05-06 ENCOUNTER — Ambulatory Visit (INDEPENDENT_AMBULATORY_CARE_PROVIDER_SITE_OTHER): Payer: Medicare Other | Admitting: Vascular Surgery

## 2020-05-06 ENCOUNTER — Ambulatory Visit: Payer: Medicare Other | Attending: Neurology | Admitting: Physical Therapy

## 2020-05-06 VITALS — BP 138/83 | HR 67 | Ht 68.0 in | Wt 253.0 lb

## 2020-05-06 DIAGNOSIS — I2510 Atherosclerotic heart disease of native coronary artery without angina pectoris: Secondary | ICD-10-CM | POA: Diagnosis not present

## 2020-05-06 DIAGNOSIS — I70213 Atherosclerosis of native arteries of extremities with intermittent claudication, bilateral legs: Secondary | ICD-10-CM

## 2020-05-06 DIAGNOSIS — R2689 Other abnormalities of gait and mobility: Secondary | ICD-10-CM | POA: Diagnosis present

## 2020-05-06 DIAGNOSIS — I6523 Occlusion and stenosis of bilateral carotid arteries: Secondary | ICD-10-CM

## 2020-05-06 DIAGNOSIS — M6281 Muscle weakness (generalized): Secondary | ICD-10-CM | POA: Diagnosis not present

## 2020-05-06 DIAGNOSIS — I714 Abdominal aortic aneurysm, without rupture, unspecified: Secondary | ICD-10-CM

## 2020-05-06 DIAGNOSIS — I10 Essential (primary) hypertension: Secondary | ICD-10-CM

## 2020-05-06 DIAGNOSIS — R262 Difficulty in walking, not elsewhere classified: Secondary | ICD-10-CM

## 2020-05-06 NOTE — Progress Notes (Signed)
MRN : [id number]  Laura Nielsen is a 64 y.o. (November 11, 1956) female who presents with chief complaint of No chief complaint on file. Marland Kitchen  History of Present Illness:   The patient returns to the office for surveillance of an abdominal aortic aneurysm status post stent graft placement on4/24/2014.   Patient denies abdominal pain or back pain, no other abdominal complaints. No groin related complaints. No symptoms consistent with distal embolization No changes in claudication distance.   There have been no interval changes in his overall healthcare since his last visit.   Patient denies amaurosis fugax or TIA symptoms. There is no history of claudication or rest pain symptoms of the lower extremities. The patient denies angina or shortness of breath.  Duplex ultrasound shows a patent stent graft no endoleak and the sac is 2.8 cm; previous study measured 2.2 cm.  Carotid duplex shows 1-39% ICA stenosis bilaterally no change  ABI Rt=1.22 and Lt=1.19 biphasic signals bilaterally  No outpatient medications have been marked as taking for the 05/06/20 encounter (Appointment) with Delana Meyer, Dolores Lory, MD.    Past Medical History:  Diagnosis Date  . Allergy   . Arthritis   . Asthma   . Depression   . Diabetes mellitus without complication ([place])   . Emphysema of lung ([place])   . GERD (gastroesophageal reflux disease)   . Hyperlipidemia   . Hypertension   . Osteoporosis   . Tobacco abuse counseling     Past Surgical History:  Procedure Laterality Date  . Cardiac catheterization  3/210   70-80% stenosis RCA stent placed. started on Plavix  . [place]  . TUBAL LIGATION    . VAGINAL HYSTERECTOMY     Menometrorrhagia. Excessive bleeding. Unknown if cervix removed.   . vascular stent  03/28/2011   Dr. Delana Meyer, [facility]; Infrarenal    Social History Social History   Tobacco Use  . Smoking status: Current Every Day Smoker    Packs/day: 1.50    Years: 44.50    Pack  years: 66.75    Types: Cigarettes  . Smokeless tobacco: Never Used  . Tobacco comment: 12/23/19 states she quit for 3 months and then started back. Pt is going to talk with MD about this.  Substance Use Topics  . Alcohol use: Yes    Comment: 1 drink every 3-4 months  . Drug use: No    Family History Family History  Problem Relation Age of Onset  . Hypertension Mother   . Coronary artery disease Mother   . Heart attack Mother        acute  . Alcohol abuse Father   . Depression Father   . Hypertension Father   . Heart attack Father 66       acute  . Alcohol abuse Sister   . Hyperlipidemia Sister   . Hypertension Sister   . Cancer Sister 47  . Heart attack Sister        x's 2  . Coronary artery disease Sister 43       x's 2  . Breast cancer Sister 38    Allergies  Allergen Reactions  . Atorvastatin Other (See Comments)    Elevated blood sugar Elevated blood sugar  . Omeprazole Nausea And Vomiting  . Bupropion Nausea Only     REVIEW OF SYSTEMS (Negative unless checked)  Constitutional: [] Weight loss  [] Fever  [] Chills Cardiac: [] Chest pain   [] Chest pressure   [] Palpitations   [] Shortness of breath  when laying flat   [] Shortness of breath with exertion. Vascular:  [] Pain in legs with walking   [] Pain in legs at rest  [] History of DVT   [] Phlebitis   [] Swelling in legs   [] Varicose veins   [] Non-healing ulcers Pulmonary:   [] Uses home oxygen   [] Productive cough   [] Hemoptysis   [] Wheeze  [] COPD   [] Asthma Neurologic:  [] Dizziness   [] Seizures   [] History of stroke   [] History of TIA  [] Aphasia   [] Vissual changes   [] Weakness or numbness in arm   [] Weakness or numbness in leg Musculoskeletal:   [] Joint swelling   [] Joint pain   [] Low back pain Hematologic:  [] Easy bruising  [] Easy bleeding   [] Hypercoagulable state   [] Anemic Gastrointestinal:  [] Diarrhea   [] Vomiting  [] Gastroesophageal reflux/heartburn   [] Difficulty swallowing. Genitourinary:  [] Chronic kidney  disease   [] Difficult urination  [] Frequent urination   [] Blood in urine Skin:  [] Rashes   [] Ulcers  Psychological:  [] History of anxiety   []  History of major depression.  Physical Examination  There were no vitals filed for this visit. There is no height or weight on file to calculate BMI. Gen: WD/WN, NAD Head: [place]/AT, No temporalis wasting.  Ear/Nose/Throat: Hearing grossly intact, nares w/o erythema or drainage Eyes: PER, EOMI, sclera nonicteric.  Neck: Supple, no large masses.   Pulmonary:  Good air movement, no audible wheezing bilaterally, no use of accessory muscles.  Cardiac: RRR, no JVD Vascular:  Vessel Right Left  Radial Palpable Palpable  Gastrointestinal: Non-distended. No guarding/no peritoneal signs.  Musculoskeletal: M/S 5/5 throughout.  No deformity or atrophy.  Neurologic: CN 2-12 intact. Symmetrical.  Speech is fluent. Motor exam as listed above. Psychiatric: Judgment intact, Mood & affect appropriate for pt's clinical situation. Dermatologic: No rashes or ulcers noted.  No changes consistent with cellulitis. Lymph : No lichenification or skin changes of chronic lymphedema.  CBC Lab Results  Component Value Date   WBC 10.0 04/20/2020   HGB 16.5 (H) 04/20/2020   HCT 49.3 (H) 04/20/2020   MCV 87 04/20/2020   PLT 243 04/20/2020    BMET    Component Value Date/Time   NA 141 04/20/2020 1141   NA 139 03/28/2013 0354   K 3.8 04/20/2020 1141   K 3.7 03/28/2013 1811   CL 102 04/20/2020 1141   CL 107 03/28/2013 0354   CO2 20 04/20/2020 1141   CO2 27 03/28/2013 0354   GLUCOSE 91 04/20/2020 1141   GLUCOSE 117 (H) 08/30/2017 0920   GLUCOSE 112 (H) 03/28/2013 0354   BUN 11 04/20/2020 1141   BUN 8 03/28/2013 0354   CREATININE 0.67 04/20/2020 1141   CREATININE 0.60 08/30/2017 0920   CALCIUM 9.7 04/20/2020 1141   CALCIUM 8.2 (L) 03/28/2013 0354   GFRNONAA 94 04/20/2020 1141   GFRNONAA 99 08/30/2017 0920   GFRAA 108 04/20/2020 1141   GFRAA 115 08/30/2017  0920   CrCl cannot be calculated (Unknown ideal weight.).  COAG Lab Results  Component Value Date   INR 0.9 04/26/2017   INR 1.1 03/28/2013    Radiology No results found.   Assessment/Plan 1. AAA (abdominal aortic aneurysm) without rupture (HCC) Recommend: Patient is status post successful endovascular repair of the AAA.   No further intervention is required at this time.   No endoleak is detected and the aneurysm sac is stable.  The patient will continue antiplatelet therapy as prescribed as well as aggressive management of hyperlipidemia. Exercise is again strongly encouraged.  However, endografts require continued surveillance with ultrasound or CT scan. This is mandatory to detect any changes that allow repressurization of the aneurysm sac.  The patient is informed that this would be asymptomatic.  The patient is reminded that lifelong routine surveillance is a necessity with an endograft. Patient will continue to follow-up at 6 month intervals with ultrasound of the aorta.  - VAS US AORTA/IVC/ILIACS; Future  2. Atherosclerosis of native artery of both lower extremities with intermittent claudication (HCC)  Recommend:  The patient has evidence of atherosclerosis of the lower extremities with claudication.  The patient does not voice lifestyle limiting changes at this point in time.  Noninvasive studies do not suggest clinically significant change.  No invasive studies, angiography or surgery at this time The patient should continue walking and begin a more formal exercise program.  The patient should continue antiplatelet therapy and aggressive treatment of the lipid abnormalities  No changes in the patient's medications at this time  The patient should continue wearing graduated compression socks 10-15 mmHg strength to control the mild edema.    3. Bilateral carotid artery stenosis Recommend:  Given the patient's asymptomatic subcritical stenosis  no further invasive testing or surgery at this time.  Previous duplex ultrasound shows <50% stenosis bilaterally.  Continue antiplatelet therapy as prescribed Continue management of CAD, HTN and Hyperlipidemia Healthy heart diet,  encouraged exercise at least 4 times per week Follow up in 12 months with duplex ultrasound and physical exam   - VAS US CAROTID; Future  4. Benign essential HTN Continue antihypertensive medications as already ordered, these medications have been reviewed and there are no changes at this time.   5. CAD in native artery Continue cardiac and antihypertensive medications as already ordered and reviewed, no changes at this time.  Continue statin as ordered and reviewed, no changes at this time  Nitrates PRN for chest pain  Hortencia Pilar, MD  05/06/2020 8:33 AM

## 2020-05-06 NOTE — Therapy (Signed)
[place] MAIN [facility] SERVICES [address] [place], [place], [address] Phone: [phone]   Fax:  [phone]  Physical Therapy Treatment / Discharge Summary  Patient Details  Name: Laura Nielsen MRN: [id number] Date of Birth: [birth year] Referring Provider (PT): shah Hemang   Encounter Date: 05/06/2020  PT End of Session - 05/06/20 1308    Visit Number  17    Number of Visits  25    Date for PT Re-Evaluation  05/06/20    PT Start Time  0100    PT Stop Time  0140    PT Time Calculation (min)  40 min    Equipment Utilized During Treatment  Gait belt    Activity Tolerance  Patient limited by pain    Behavior During Therapy  WFL for tasks assessed/performed       Past Medical History:  Diagnosis Date  . Allergy   . Arthritis   . Asthma   . Depression   . Diabetes mellitus without complication ([place])   . Emphysema of lung ([place])   . GERD (gastroesophageal reflux disease)   . Hyperlipidemia   . Hypertension   . Osteoporosis   . Tobacco abuse counseling     Past Surgical History:  Procedure Laterality Date  . Cardiac catheterization  3/210   70-80% stenosis RCA stent placed. started on Plavix  . [place]  . TUBAL LIGATION    . VAGINAL HYSTERECTOMY     Menometrorrhagia. Excessive bleeding. Unknown if cervix removed.   . vascular stent  03/28/2011   Dr. Delana Meyer, [facility]; Infrarenal    There were no vitals filed for this visit.  Subjective Assessment - 05/06/20 1306    Subjective  Patient is having some back pain from the rain. She has another graduation tomorrow.    Pertinent History  Patient began having symprtoms of weakness, and poor balance and difficulty finding words 5 years ago. She walks without any device. She has had 1 fall and landed on the bed 1 year ago.    Limitations  Standing;Walking    How long can you sit comfortably?  unlimited    How long can you stand comfortably?  5 mins    How long can you walk  comfortably?  40 feet    Patient Stated Goals  to walk better    Currently in Pain?  No/denies    Pain Score  5     Pain Location  Back    Pain Orientation  Lower    Pain Descriptors / Indicators  Aching    Pain Type  Chronic pain    Pain Onset  More than a month ago       Neuromuscular Re-education   Tandem stand on floor without UE support x 2 lengths Side stepping on floor without UE support x 2 lengths Heel/toe raises without UE support 3s hold x 10 each 1/2 foam roll balance with flat side up 30s x 2 reps 1/2 foam roll balance with flat side down 30s x 2 reps 1/2 foam roll tandem balance alternating forward LE 30s x 2 each LE forward Lateral side steps from foam to 6 inch stool left and right x 15 Backwards stepping from foam to 6 inch stool x 15         Pt educated throughout session about proper posture and technique with exercises. Improved exercise technique, movement at target joints, use of target muscles after min to mod verbal,  visual, tactile cues. CGA and Min to mod verbal cues used throughout with increased in postural sway and LOB most seen with narrow base of support and while on uneven surfaces.                       PT Education - 05/06/20 1307    Education Details  progress for 10th visit    Person(s) Educated  Patient    Methods  Explanation    Comprehension  Verbalized understanding       PT Short Term Goals - 03/16/20 1333      PT SHORT TERM GOAL #1   Title  Patient will be independent in home exercise program to improve strength/mobility for better functional independence with ADLs.    Baseline  4/13: doing some everday    Time  4    Period  Weeks    Status  Achieved    Target Date  02/12/20      PT SHORT TERM GOAL #2   Title  Patient (> 40 years old) will complete five times sit to stand test in < 15 seconds indicating an increased LE strength and improved balance.    Baseline  4/13: 15.2 sec    Time  4    Period  Weeks     Status  Partially Met    Target Date  02/12/20        PT Long Term Goals - 05/06/20 1309      PT LONG TERM GOAL #1   Title  Patient will increase Berg Balance score by > 6 points to demonstrate decreased fall risk during functional activities    Period  Weeks    Status  Partially Met    Target Date  05/06/20      PT LONG TERM GOAL #2   Title  Patient will increase six minute walk test distance to >1000 for progression to community ambulator and improve gait ability    Time  8    Period  Weeks    Status  Partially Met    Target Date  05/06/20      PT LONG TERM GOAL #3   Title  Patient will increase 10 meter walk test to >1.55ms as to improve gait speed for better community ambulation and to reduce fall risk.    Time  8    Period  Weeks    Status  Partially Met    Target Date  05/06/20            Plan - 05/06/20 1308    Clinical Impression Statement  Patient has demonstrated improved dynamic balance, gait, transfers, mobility, LE strength,  and has made progress with goals # 1,2,3. Patient will be discharged from skilled physical therapy to HEP.   Personal Factors and Comorbidities  Comorbidity 1;Comorbidity 2    Comorbidities  diabetes,CHF    Examination-Activity Limitations  Bathing;Locomotion Level;Stand;Bed Mobility    Examination-Participation Restrictions  Cleaning;Community Activity    Stability/Clinical Decision Making  Stable/Uncomplicated    Rehab Potential  Fair    PT Frequency  2x / week    PT Duration  8 weeks    PT Treatment/Interventions  Therapeutic activities;Therapeutic exercise;Balance training;Neuromuscular re-education;Patient/family education;Gait training;Moist Heat;Electrical Stimulation;Manual techniques    PT Next Visit Plan  balance and strengthening    Consulted and Agree with Plan of Care  Patient       Patient will benefit from skilled therapeutic intervention in order to  improve the following deficits and impairments:  Abnormal gait,  Decreased endurance, Decreased range of motion, Decreased strength, Pain, Impaired sensation, Dizziness, Decreased mobility, Difficulty walking, Obesity  Visit Diagnosis: Muscle weakness (generalized)  Difficulty in walking, not elsewhere classified  Other abnormalities of gait and mobility     Problem List Patient Active Problem List   Diagnosis Date Noted  . Recurrent major depressive disorder, in partial remission ([place]) 04/20/2020  . Aortic atherosclerosis ([place]) 07/17/2019  . Unspecified inflammatory spondylopathy, lumbar region ([place]) 10/04/2018  . Abnormal MRI, lumbar spine (03/28/2017) 07/04/2017  . Abnormal MRI, cervical spine (03/28/2017) 07/04/2017  . Lumbar facet syndrome (Bilateral) (L>R) 04/26/2017  . Lumbar facet hypertrophy (multilevel) (Bilateral) 04/26/2017  . Long term current use of anticoagulant therapy 04/26/2017  . Neurogenic pain 04/26/2017  . Musculoskeletal pain 04/26/2017  . Chronic pain syndrome 03/08/2017  . Long term prescription opiate use 03/08/2017  . Opiate use 03/08/2017  . Chronic low back pain (Primary Area of Pain) (Bilateral) (L>R) 03/08/2017  . Chronic neck pain (Secondary area of Pain) (Bilateral) (R>L) 03/08/2017  . Cervicogenic headache (Right) 03/08/2017  . Chronic upper back pain (Third area of Pain) (Bilateral) (R>L) 03/08/2017  . Chronic hip pain (Bilateral) (L>R) 03/08/2017  . Osteoarthritis of hip (Bilateral) (L>R) 03/08/2017  . Cervical central spinal stenosis 03/08/2017  . Cervical spondylosis with radiculopathy (Right) (C5) 03/08/2017  . Lumbar spondylosis 03/08/2017  . Atherosclerosis of native arteries of extremity with intermittent claudication ([place]) 02/22/2017  . Gout 02/16/2016  . Ankle pain 11/17/2015  . Arthritis 05/14/2015  . Carotid artery narrowing 05/14/2015  . Claudication ([place]) 05/14/2015  . CAFL (chronic airflow limitation) ([place]) 05/14/2015  . Clinical depression 05/14/2015  . Acid reflux 05/14/2015  .  Urinary incontinence 05/14/2015  . Pins and needles sensation 05/14/2015  . Obstructive apnea 05/14/2015  . Morbid obesity ([place]) 05/14/2015  . History of abnormal cervical Papanicolaou smear 05/14/2015  . Peripheral blood vessel disorder ([place]) 05/14/2015  . B12 deficiency 05/14/2015  . Vitamin D insufficiency 05/14/2015  . AAA (abdominal aortic aneurysm) without rupture ([place]) 05/26/2014  . Aortic heart valve narrowing 01/31/2013  . Malaise and fatigue 09/26/2012  . CAD in native artery 03/17/2009  . Diabetes mellitus, type 2 ([place]) 02/05/2009  . Hypercholesteremia 06/24/2008  . Allergic rhinitis 10/25/2007  . Airway hyperreactivity 10/25/2007  . Smoking greater than 30 pack years 10/25/2007  . Narrowing of intervertebral disc space 10/25/2007  . Benign essential HTN 10/25/2007  . Arthritis, degenerative 10/25/2007    Alanson Puls, PT DPT 05/06/2020, 1:10 PM  [place] MAIN [facility] SERVICES [address] [place], [place], [address] Phone: [phone]   Fax:  [phone]  Name: Lorilee Cafarella MRN: [id number] Date of Birth: [birth year]

## 2020-05-14 DIAGNOSIS — R42 Dizziness and giddiness: Secondary | ICD-10-CM | POA: Diagnosis not present

## 2020-05-26 ENCOUNTER — Other Ambulatory Visit: Payer: Self-pay | Admitting: Family Medicine

## 2020-05-26 DIAGNOSIS — E7800 Pure hypercholesterolemia, unspecified: Secondary | ICD-10-CM

## 2020-06-23 DIAGNOSIS — H903 Sensorineural hearing loss, bilateral: Secondary | ICD-10-CM | POA: Diagnosis not present

## 2020-07-07 ENCOUNTER — Other Ambulatory Visit: Payer: Self-pay | Admitting: Family Medicine

## 2020-07-07 DIAGNOSIS — I10 Essential (primary) hypertension: Secondary | ICD-10-CM

## 2020-07-08 ENCOUNTER — Telehealth: Payer: Self-pay

## 2020-07-08 DIAGNOSIS — Z122 Encounter for screening for malignant neoplasm of respiratory organs: Secondary | ICD-10-CM

## 2020-07-08 DIAGNOSIS — Z87891 Personal history of nicotine dependence: Secondary | ICD-10-CM

## 2020-07-08 NOTE — Telephone Encounter (Signed)
Message left notifying patient that it is time to schedule the low dose lung cancer screening CT scan.  Instructed patient to return call to Burgess Estelle at [phone] to verify information prior to CT scan being scheduled.

## 2020-07-09 NOTE — Telephone Encounter (Signed)
Contacted and scheduled

## 2020-07-09 NOTE — Addendum Note (Signed)
Addended by: Lieutenant Diego on: 07/09/2020 12:52 PM   Modules accepted: Orders

## 2020-07-13 DIAGNOSIS — G5603 Carpal tunnel syndrome, bilateral upper limbs: Secondary | ICD-10-CM | POA: Diagnosis not present

## 2020-07-13 DIAGNOSIS — M5030 Other cervical disc degeneration, unspecified cervical region: Secondary | ICD-10-CM | POA: Diagnosis not present

## 2020-07-13 DIAGNOSIS — G250 Essential tremor: Secondary | ICD-10-CM | POA: Diagnosis not present

## 2020-07-13 DIAGNOSIS — R2689 Other abnormalities of gait and mobility: Secondary | ICD-10-CM | POA: Diagnosis not present

## 2020-07-21 ENCOUNTER — Encounter: Payer: Self-pay | Admitting: Physician Assistant

## 2020-07-21 ENCOUNTER — Telehealth (INDEPENDENT_AMBULATORY_CARE_PROVIDER_SITE_OTHER): Payer: Medicare Other | Admitting: Physician Assistant

## 2020-07-21 ENCOUNTER — Ambulatory Visit: Admission: RE | Admit: 2020-07-21 | Payer: Medicare Other | Source: Ambulatory Visit

## 2020-07-21 DIAGNOSIS — J441 Chronic obstructive pulmonary disease with (acute) exacerbation: Secondary | ICD-10-CM

## 2020-07-21 DIAGNOSIS — J449 Chronic obstructive pulmonary disease, unspecified: Secondary | ICD-10-CM

## 2020-07-21 DIAGNOSIS — Z20828 Contact with and (suspected) exposure to other viral communicable diseases: Secondary | ICD-10-CM

## 2020-07-21 MED ORDER — PREDNISONE 20 MG PO TABS: 40.0000 mg | ORAL_TABLET | Freq: Every day | ORAL | 0 refills | Status: AC

## 2020-07-21 MED ORDER — DOXYCYCLINE HYCLATE 100 MG PO TABS: 100.0000 mg | ORAL_TABLET | Freq: Two times a day (BID) | ORAL | 0 refills | Status: AC

## 2020-07-21 NOTE — Progress Notes (Signed)
MyChart Video Visit    Virtual Visit via Video Note   This visit type was conducted due to national recommendations for restrictions regarding the COVID-19 Pandemic (e.g. social distancing) in an effort to limit this patient's exposure and mitigate transmission in our community. This patient is at least at moderate risk for complications without adequate follow up. This format is felt to be most appropriate for this patient at this time. Physical exam was limited by quality of the video and audio technology used for the visit.   Patient location: Home Provider location: office    I discussed the limitations of evaluation and management by telemedicine and the availability of in person appointments. The patient expressed understanding and agreed to proceed.    Patient: Laura Nielsen   DOB: 1956/08/01   64 y.o. Female  MRN: [id number] Visit Date: 07/21/2020  Today's healthcare provider: Trinna Post, PA-C   Chief Complaint  Patient presents with  . URI   Subjective    HPI   Patient with history of COPD not on daily inhaler presenting today for URI symptoms staring on 07/18/2020 with hoarseness. She reports nasal congestion. She denies SOB above baseline. She reports coughing worsening at night. She reports it is productive of yellow brown sputum. She has been taking Nyquil. She denies having a fever. She denies muscle aches. She was vaccinated for COVID in March 2021. She reports being around [place] who were positive for RSV. She denies nausea, vomiting, and diarrhea.     Medications: Outpatient Medications Prior to Visit  Medication Sig  . albuterol (PROVENTIL HFA) 108 (90 Base) MCG/ACT inhaler Inhale 2 puffs into the lungs every 6 (six) hours as needed for wheezing or shortness of breath.  . allopurinol (ZYLOPRIM) 100 MG tablet Take 1 tablet (100 mg total) by mouth daily.  Mar[place] Kitchen aspirin 81 MG tablet Take 81 mg by mouth daily.   . bisoprolol (ZEBETA) 10 MG tablet Take 1 tablet  (10 mg total) by mouth daily.  . clopidogrel (PLAVIX) 75 MG tablet Take 1 tablet (75 mg total) by mouth daily.  . cyanocobalamin (,VITAMIN B-12,) 1000 MCG/ML injection Inject 32ml IM once a week for four weeks then once a month for four months.  . cyanocobalamin 1000 MCG tablet Take 1,000 mcg by mouth daily.  Mar[place] Kitchen gabapentin (NEURONTIN) 300 MG capsule Take 1 capsule (300 mg total) by mouth 2 (two) times daily AND 2 capsules (600 mg total) at bedtime. Take 1 capsule (300 mg total) by mouth 3 (three) times daily.. (Patient taking differently: Take 1 capsule (300 mg total) by mouth 2 (two) times daily AND 2 capsules (600 mg total) at bedtime.)  . Ibuprofen 200 MG CAPS Take by mouth.   Marland Kitchen lisinopril-hydrochlorothiazide (ZESTORETIC) 20-12.5 MG tablet Take 1 tablet by mouth daily.  Marland Kitchen loratadine (CLARITIN) 10 MG tablet Take 10 mg by mouth daily as needed.   Marland Kitchen OZEMPIC, 0.25 OR 0.5 MG/DOSE, 2 MG/1.5ML SOPN Inject 0.5 mg into the skin once a week.  . rosuvastatin (CRESTOR) 20 MG tablet Take 1 tablet (20 mg total) by mouth daily.  . Semaglutide, 1 MG/DOSE, (OZEMPIC, 1 MG/DOSE,) 2 MG/1.5ML SOPN Inject 1 mg into the skin once a week.  Marland Kitchen buPROPion (WELLBUTRIN XL) 150 MG 24 hr tablet Take 1 tablet (150 mg total) by mouth daily. For 8 days, then increase to 2 daily (Patient not taking: Reported on 05/06/2020)  . montelukast (SINGULAIR) 10 MG tablet Take 1 tablet (10 mg total) by mouth  at bedtime. (Patient not taking: Reported on 05/06/2020)  . oxyCODONE (ROXICODONE) 5 MG immediate release tablet Take 1 tablet (5 mg total) by mouth daily as needed for severe pain.   No facility-administered medications prior to visit.    Review of Systems  Constitutional: Positive for activity change and fatigue.  HENT: Positive for congestion, postnasal drip, sneezing and sore throat.   Respiratory: Positive for cough and shortness of breath.   Neurological: Positive for headaches.      Objective    There were no vitals taken for  this visit.   Physical Exam Constitutional:      General: She is not in acute distress.    Appearance: Normal appearance.  Pulmonary:     Effort: Pulmonary effort is normal. No respiratory distress.  Neurological:     Mental Status: She is alert.  Psychiatric:        Mood and Affect: Mood normal.        Behavior: Behavior normal.        Assessment & Plan    1. CAFL (chronic airflow limitation) (HCC)  - concern for possible COVID19 infection - will send for outpatient testing - discussed need to quarantine 10 days from start of symptoms and until fever-free for at least 48 hours - discussed need to quarantine household members - discussed symptomatic management, natural course, and return precautions   2. COPD exacerbation (HCC)  - doxycycline (VIBRA-TABS) 100 MG tablet; Take 1 tablet (100 mg total) by mouth 2 (two) times daily for 7 days.  Dispense: 14 tablet; Refill: 0 - predniSONE (DELTASONE) 20 MG tablet; Take 2 tablets (40 mg total) by mouth daily with breakfast for 5 days.  Dispense: 10 tablet; Refill: 0  3. Exposure to respiratory syncytial virus (RSV)     No follow-ups on file.     I discussed the assessment and treatment plan with the patient. The patient was provided an opportunity to ask questions and all were answered. The patient agreed with the plan and demonstrated an understanding of the instructions.   The patient was advised to call back or seek an in-person evaluation if the symptoms worsen or if the condition fails to improve as anticipated.   ITrinna Post, PA-C, have reviewed all documentation for this visit. The documentation on 07/21/20 for the exam, diagnosis, procedures, and orders are all accurate and complete.  The entirety of the information documented in the History of Present Illness, Review of Systems and Physical Exam were personally obtained by me. Portions of this information were initially documented by Wilburt Finlay, CMA  and reviewed by me for thoroughness and accuracy.       Paulene Floor [facility] [phone] (phone) [phone] (fax)  [place]

## 2020-07-23 DIAGNOSIS — Z20822 Contact with and (suspected) exposure to covid-19: Secondary | ICD-10-CM | POA: Diagnosis not present

## 2020-07-30 ENCOUNTER — Ambulatory Visit
Admission: RE | Admit: 2020-07-30 | Discharge: 2020-07-30 | Disposition: A | Discharge status: Home / Self Care | Payer: Medicare Other | Source: Ambulatory Visit | Attending: Oncology | Admitting: Oncology

## 2020-07-30 ENCOUNTER — Other Ambulatory Visit: Payer: Self-pay

## 2020-07-30 DIAGNOSIS — F17210 Nicotine dependence, cigarettes, uncomplicated: Secondary | ICD-10-CM | POA: Diagnosis not present

## 2020-07-30 DIAGNOSIS — Z122 Encounter for screening for malignant neoplasm of respiratory organs: Secondary | ICD-10-CM

## 2020-07-30 DIAGNOSIS — Z87891 Personal history of nicotine dependence: Secondary | ICD-10-CM | POA: Diagnosis not present

## 2020-08-02 ENCOUNTER — Telehealth: Payer: Self-pay | Admitting: *Deleted

## 2020-08-02 DIAGNOSIS — R911 Solitary pulmonary nodule: Secondary | ICD-10-CM

## 2020-08-02 NOTE — Telephone Encounter (Signed)
[facility] Radiology called a CT scan report to my extention for Rulon Abide, NP. Please see impression below. - Elson Clan, RN    Lungs/Pleura: Multiple small pulmonary nodules are noted throughout the lungs bilaterally, most concerning of which is an enlarging solid nodule at the periphery of a cystic area in the left lower lobe (axial image 198 of series 3), with a volume derived mean diameter of 9.8 mm, increased from 7 mm on the prior examination. No acute consolidative airspace disease. No pleural effusions. Diffuse bronchial wall thickening with mild centrilobular and paraseptal emphysema.    IMPRESSION: 1. Lung-RADS 4BS, suspicious. Additional imaging evaluation or consultation with Pulmonology or Thoracic Surgery recommended. 2. The "S" modifier above refers to potentially clinically significant non lung cancer related findings. Specifically, there is aortic atherosclerosis, in addition to three-vessel coronary artery disease. Please note that although the presence of coronary artery calcium documents the presence of coronary artery disease, the severity of this disease and any potential stenosis cannot be assessed on this non-gated CT examination. Assessment for potential risk factor modification, dietary therapy or pharmacologic therapy may be warranted, if clinically indicated. 3. Mild diffuse bronchial wall thickening with mild centrilobular and paraseptal emphysema; imaging findings suggestive of underlying COPD. 4. Hepatic steatosis.

## 2020-08-02 NOTE — Addendum Note (Signed)
Addended by: Telford Nab on: 08/02/2020 01:46 PM   Modules accepted: Orders

## 2020-08-02 NOTE — Telephone Encounter (Signed)
Spoke with patient and reviewed results and recommendations with her. All questions answered during call. Reassurance provided. Pt agreeable for PET scan at this time. Informed pt that she will be notified once she has been scheduled for PET.   Will follow up with pt after PET to schedule further follow up. Pt verbalized understanding.

## 2020-08-02 NOTE — Progress Notes (Signed)
FYI

## 2020-08-02 NOTE — Telephone Encounter (Signed)
Per Dr. Patsey Berthold, pt will need PET scan and further evaluation by Dr. Genevive Bi. Percutaneous biopsy would be considered high risk at this time and bronchoscopic biopsy is not feasible due to location of nodule.  Message left with patient to review results and recommendations. Awaiting call back.

## 2020-08-09 ENCOUNTER — Encounter: Payer: Self-pay | Admitting: Family Medicine

## 2020-08-09 DIAGNOSIS — K760 Fatty (change of) liver, not elsewhere classified: Secondary | ICD-10-CM | POA: Insufficient documentation

## 2020-08-11 ENCOUNTER — Other Ambulatory Visit: Payer: Self-pay

## 2020-08-11 ENCOUNTER — Ambulatory Visit
Admission: RE | Admit: 2020-08-11 | Discharge: 2020-08-11 | Disposition: A | Discharge status: Home / Self Care | Payer: Medicare Other | Source: Ambulatory Visit | Attending: Oncology | Admitting: Oncology

## 2020-08-11 DIAGNOSIS — N838 Other noninflammatory disorders of ovary, fallopian tube and broad ligament: Secondary | ICD-10-CM | POA: Diagnosis not present

## 2020-08-11 DIAGNOSIS — R911 Solitary pulmonary nodule: Secondary | ICD-10-CM | POA: Insufficient documentation

## 2020-08-11 DIAGNOSIS — J439 Emphysema, unspecified: Secondary | ICD-10-CM | POA: Diagnosis not present

## 2020-08-11 DIAGNOSIS — K5730 Diverticulosis of large intestine without perforation or abscess without bleeding: Secondary | ICD-10-CM | POA: Diagnosis not present

## 2020-08-11 LAB — GLUCOSE, CAPILLARY: Glucose-Capillary: 130 mg/dL — ABNORMAL HIGH (ref 70–99)

## 2020-08-11 MED ORDER — FLUDEOXYGLUCOSE F - 18 (FDG) INJECTION
13.1000 | Freq: Once | INTRAVENOUS | Status: AC | PRN
  Administered 2020-08-11: 13.43 via INTRAVENOUS

## 2020-08-11 NOTE — Progress Notes (Signed)
FYI

## 2020-08-12 ENCOUNTER — Telehealth: Payer: Self-pay | Admitting: *Deleted

## 2020-08-12 DIAGNOSIS — N83201 Unspecified ovarian cyst, right side: Secondary | ICD-10-CM

## 2020-08-12 NOTE — Telephone Encounter (Addendum)
Patient advised and agrees to GYN referral. Order placed. Please schedule.

## 2020-08-12 NOTE — Telephone Encounter (Signed)
Please advise patient she needs referral to Gyn for evaluation of right ovarian cyst on PET scan. Recommend [facility] if she doesn't have any preferences.

## 2020-08-12 NOTE — Addendum Note (Signed)
Addended by: Randal Buba on: 08/12/2020 03:24 PM   Modules accepted: Orders

## 2020-08-12 NOTE — Telephone Encounter (Signed)
Reviewed results of PET scan noted below. Discussed will be obtaining another opinion to confirm interval for follow up on PET negative lung nodule.   Also discussed the gyn finding and recommendation for [place]. Patient reports she sees her PCP for gynecological appointments and will contact him next week if she is not contacted. She realizes she needs further imaging to characterize the ovarian finding. This message is forwarded to PCP as well.   IMPRESSION: 9 mm left lower lobe pulmonary nodule shows no FDG uptake. Recommend continued annual low-dose chest CT screening for lung carcinoma.  Gradually increased size of 5.8 cm right adnexal cystic lesion compared to prior CT in 2014. This lesion appears complex although it is suboptimally evaluated without IV contrast. This lesion shows no FDG uptake uptake, and is highly suspicious for low-grade cystic ovarian neoplasm. Suggest correlation with tumor markers, and recommend pelvic ultrasound for further characterization

## 2020-08-18 ENCOUNTER — Other Ambulatory Visit: Payer: Self-pay | Admitting: Family Medicine

## 2020-08-18 DIAGNOSIS — E7800 Pure hypercholesterolemia, unspecified: Secondary | ICD-10-CM

## 2020-08-19 ENCOUNTER — Encounter: Payer: Self-pay | Admitting: Pain Medicine

## 2020-08-19 ENCOUNTER — Ambulatory Visit: Payer: Medicare Other | Attending: Pain Medicine | Admitting: Pain Medicine

## 2020-08-19 ENCOUNTER — Other Ambulatory Visit: Payer: Self-pay

## 2020-08-19 DIAGNOSIS — M792 Neuralgia and neuritis, unspecified: Secondary | ICD-10-CM

## 2020-08-19 DIAGNOSIS — M899 Disorder of bone, unspecified: Secondary | ICD-10-CM | POA: Insufficient documentation

## 2020-08-19 DIAGNOSIS — M546 Pain in thoracic spine: Secondary | ICD-10-CM

## 2020-08-19 DIAGNOSIS — M545 Low back pain: Secondary | ICD-10-CM

## 2020-08-19 DIAGNOSIS — Z79899 Other long term (current) drug therapy: Secondary | ICD-10-CM | POA: Insufficient documentation

## 2020-08-19 DIAGNOSIS — M542 Cervicalgia: Secondary | ICD-10-CM

## 2020-08-19 DIAGNOSIS — Z789 Other specified health status: Secondary | ICD-10-CM | POA: Insufficient documentation

## 2020-08-19 DIAGNOSIS — G8929 Other chronic pain: Secondary | ICD-10-CM | POA: Diagnosis not present

## 2020-08-19 DIAGNOSIS — G894 Chronic pain syndrome: Secondary | ICD-10-CM

## 2020-08-19 MED ORDER — OXYCODONE HCL 5 MG PO TABS: 5.0000 mg | ORAL_TABLET | Freq: Every day | ORAL | 0 refills | Status: DC | PRN

## 2020-08-19 NOTE — Progress Notes (Signed)
Patient: Laura Nielsen  Service Category: E/M  Provider: Gaspar Cola, MD  DOB: [birth year]  DOS: 08/19/2020  Location: Office  MRN: [id number]  Setting: Ambulatory outpatient  Referring Provider: Birdie Sons, MD  Type: Established Patient  Specialty: Interventional Pain Management  PCP: Birdie Sons, MD  Location: Remote location  Delivery: TeleHealth     Virtual Encounter - Pain Management PROVIDER NOTE: Information contained herein reflects review and annotations entered in association with encounter. Interpretation of such information and data should be left to medically-trained personnel. Information provided to patient can be located elsewhere in the medical record under "Patient Instructions". Document created using STT-dictation technology, any transcriptional errors that may result from process are unintentional.    Contact & Pharmacy Preferred: [phone] Home: [phone] (home) Mobile: [phone] (mobile) E-mail: [email or web address]  [facility] [place] [address] [place] [address] Phone: [phone] Fax: [phone]   Pre-screening  Laura Nielsen offered "in-person" vs "virtual" encounter. She indicated preferring virtual for this encounter.   Reason COVID-19*  Social distancing based on CDC and AMA recommendations.   I contacted Laura Nielsen on 08/19/2020 via telephone.      I clearly identified myself as Gaspar Cola, MD. I verified that I was speaking with the correct person using two identifiers (Name: Laura Nielsen, and date of birth: [birth year]).  Consent I sought verbal advanced consent from Laura Nielsen for virtual visit interactions. I informed Ms. Ham of possible security and privacy concerns, risks, and limitations associated with providing "not-in-person" medical evaluation and management services. I also informed Laura Nielsen of the availability of "in-person" appointments. Finally, I informed her that there  would be a charge for the virtual visit and that she could be  personally, fully or partially, financially responsible for it. Laura Nielsen expressed understanding and agreed to proceed.   Historic Elements   Laura Nielsen is a 64 y.o. year old, female patient evaluated today after our last contact on Visit date not found. Laura Nielsen  has a past medical history of Allergy, Arthritis, Asthma, Depression, Diabetes mellitus without complication ([place]), Emphysema of lung ([place]), GERD (gastroesophageal reflux disease), Hyperlipidemia, Hypertension, Osteoporosis, and Tobacco abuse counseling. She also  has a past surgical history that includes vascular stent (03/28/2011); Cardiac catheterization (3/210); Kidney stone surgery (1999); Vaginal hysterectomy; and Tubal ligation. Laura Nielsen has a current medication list which includes the following prescription(s): albuterol, allopurinol, aspirin, bisoprolol, clopidogrel, cyanocobalamin, gabapentin, ibuprofen, lisinopril-hydrochlorothiazide, loratadine, rosuvastatin, ozempic (1 mg/dose), bupropion, cyanocobalamin, montelukast, oxycodone, and ozempic (0.25 or 0.5 mg/dose). She  reports that she has been smoking cigarettes. She has a 66.75 pack-year smoking history. She has never used smokeless tobacco. She reports current alcohol use. She reports that she does not use drugs. Laura Nielsen is allergic to atorvastatin, omeprazole, and bupropion.   HPI  Today, she is being contacted for medication management. The patient indicates doing well with the current medication regimen. No adverse reactions or side effects reported to the medications.  The patient indicates that she hardly ever uses the oxycodone, but just the other day she had to take 1 tablet for the low back and leg pain, and about 12 hours later she had to take another one.  When she did, then she developed nausea secondary to the medicine.  Her prescriptions have been lasting for quite some time and today I will provide  her with one and I have asked her to  give [place] a call when she runs out.  In addition to this, today I will also transfer her Neurontin to her primary care physician.  She has indicated that she takes 300 mg 3 times a day.  She just had a prescription filled today and it has 2 additional refills on it.  This means that she did not need any of that Neurontin for the next 3 months.  (11/17/2020)  Transfer: Gabapentin (Neurontin) 300 mg capsule, 1 capsule p.o. 3 times daily (90/month) (has enough to last until 11/17/2020.)  Pharmacotherapy Assessment  Analgesic: Oxycodone IR 5 mg, 1 tab PO QD PRN (<5 mg/day of oxycodone) MME: <7.5 mg/day.   Monitoring: [place] PMP: PDMP reviewed during this encounter.       Pharmacotherapy: No side-effects or adverse reactions reported. Compliance: No problems identified. Effectiveness: Clinically acceptable. Plan: Refer to "POC".  UDS:  Summary  Date Value Ref Range Status  03/08/2017 FINAL  Final    Comment:    ==================================================================== TOXAS[facility]RE COMP DRUG ANALYSIS,UR ==================================================================== Test                             Result       Flag       Units Drug Present and Declared for Prescription Verification   Gabapentin                     PRESENT      EXPECTED   Amitriptyline                  PRESENT      EXPECTED   Nortriptyline                  PRESENT      EXPECTED    Nortriptyline is an expected metabolite of amitriptyline.   Salicylate                     PRESENT      EXPECTED Drug Absent but Declared for Prescription Verification   Oxycodone                      Not Detected UNEXPECTED ng/mg creat   Bupropion                      Not Detected UNEXPECTED   Acetaminophen                  Not Detected UNEXPECTED    Acetaminophen, as indicated in the declared medication list, is    not always detected even when used as directed.   Ibuprofen                      Not  Detected UNEXPECTED    Ibuprofen, as indicated in the declared medication list, is not    always detected even when used as directed. ==================================================================== Test                      Result    Flag   Units      Ref Range   Creatinine              160              mg/dL      >=20 ==================================================================== Declared Medications:  The flagging and interpretation on this report are based on  the  following declared medications.  Unexpected results may arise from  inaccuracies in the declared medications.  **Note: The testing scope of this panel includes these medications:  Amitriptyline (Elavil)  Bupropion (Wellbutrin)  Gabapentin  Oxycodone (Oxycodone Acetaminophen)  **Note: The testing scope of this panel does not include small to  moderate amounts of these reported medications:  Acetaminophen (Oxycodone Acetaminophen)  Aspirin  Ibuprofen  **Note: The testing scope of this panel does not include following  reported medications:  Albuterol  Allopurinol (Zyloprim)  Bisoprolol (Zebeta)  Clopidogrel (Plavix)  Cyanocobalamin  Lisinopril  Loratadine (Claritin)  Lovastatin (Mevacor)  Triamcinolone (Kenalog)  Vitamin C  Vitamin D ==================================================================== For clinical consultation, please call [phone]. ====================================================================     Laboratory Chemistry Profile   Renal Lab Results  Component Value Date   BUN 11 04/20/2020   CREATININE 0.67 04/20/2020   BCR 16 04/20/2020   GFRAA 108 04/20/2020   GFRNONAA 94 04/20/2020     Hepatic Lab Results  Component Value Date   AST 31 04/20/2020   ALT 29 04/20/2020   ALBUMIN 4.3 04/20/2020   ALKPHOS 106 04/20/2020   LIPASE 140 08/26/2012     Electrolytes Lab Results  Component Value Date   NA 141 04/20/2020   K 3.8 04/20/2020   CL 102 04/20/2020    CALCIUM 9.7 04/20/2020   MG 1.8 03/08/2017   PHOS 4.0 05/18/2016     Bone Lab Results  Component Value Date   VD25OH 42.3 04/20/2020   25OHVITD1 24 (L) 03/08/2017   25OHVITD2 8.2 03/08/2017   25OHVITD3 16 03/08/2017     Inflammation (CRP: Acute Phase) (ESR: Chronic Phase) Lab Results  Component Value Date   CRP <0.8 03/08/2017   ESRSEDRATE 5 03/08/2017       Note: Above Lab results reviewed.  Imaging  NM PET Image Initial (PI) Skull Base To Thigh CLINICAL DATA:  Initial treatment strategy for left lower lobe pulmonary nodule.  EXAM: NUCLEAR MEDICINE PET SKULL BASE TO THIGH  TECHNIQUE: 13.4 mCi F-18 FDG was injected intravenously. Full-ring PET imaging was performed from the skull base to thigh after the radiotracer. CT data was obtained and used for attenuation correction and anatomic localization.  Fasting blood glucose: 130 mg/dl  COMPARISON:  Chest CT on 07/30/2020, and AP CT on 02/27/2013  FINDINGS: Mediastinal blood-pool activity (background): SUV max = 3.1  Liver activity (reference): SUV max = N/A  NECK:  No hypermetabolic lymph nodes or masses.  Incidental CT findings:  None.  CHEST: No hypermetabolic masses or lymphadenopathy. 9 mm solid nodule in the posterior left lower lobe which is associated with adjacent cystic area shows no FDG uptake. A few other tiny pulmonary nodules remains stable and show no FDG uptake.  Incidental CT findings:  Mild emphysema again noted.  ABDOMEN/PELVIS: No abnormal hypermetabolic activity within the liver, pancreas, adrenal glands, or spleen. No hypermetabolic lymph nodes in the abdomen or pelvis.  Incidental CT findings: Aortic stent graft noted. Sigmoid diverticulosis is noted, without evidence of diverticulitis. A cystic lesion is seen in the right adnexa which measures 5.8 x 5.0 cm. This appears to have some internal septations although it is suboptimally evaluated without IV contrast. This has increased  in size from 3.8 x 3.8 cm on CT in 2014, but shows no evidence of hypermetabolic activity.  SKELETON: No focal hypermetabolic bone lesions to suggest skeletal metastasis.  Incidental CT findings:  None.  IMPRESSION: 9 mm left lower lobe pulmonary nodule shows no FDG  uptake. Recommend continued annual low-dose chest CT screening for lung carcinoma.  Gradually increased size of 5.8 cm right adnexal cystic lesion compared to prior CT in 2014. This lesion appears complex although it is suboptimally evaluated without IV contrast. This lesion shows no FDG uptake uptake, and is highly suspicious for low-grade cystic ovarian neoplasm. Suggest correlation with tumor markers, and recommend pelvic ultrasound for further characterization.  Electronically Signed   By: Marlaine Hind M.D.   On: 08/11/2020 11:40  Assessment  The primary encounter diagnosis was Chronic pain syndrome. Diagnoses of Chronic low back pain (Primary Area of Pain) (Bilateral) (L>R), Chronic neck pain (Secondary area of Pain) (Bilateral) (R>L), Chronic upper back pain (Third area of Pain) (Bilateral) (R>L), Pharmacologic therapy, Disorder of skeletal system, Problems influencing health status, and Neurogenic pain were also pertinent to this visit.  Plan of Care  Problem-specific:  No problem-specific Assessment & Plan notes found for this encounter.  Laura Nielsen has a current medication list which includes the following long-term medication(s): albuterol, allopurinol, bisoprolol, gabapentin, lisinopril-hydrochlorothiazide, loratadine, rosuvastatin, bupropion, montelukast, and oxycodone.  Pharmacotherapy (Medications Ordered): Meds ordered this encounter  Medications  . oxyCODONE (ROXICODONE) 5 MG immediate release tablet    Sig: Take 1 tablet (5 mg total) by mouth daily as needed for severe pain.    Dispense:  15 tablet    Refill:  0    Chronic Pain: STOP Act (Not applicable) Fill 1 day early if closed on refill  date. Do not fill until: 03/10/2020. To last until: 08/25/2020. Avoid benzodiazepines within 8 hours of opioids   Orders:  No orders of the defined types were placed in this encounter.  Follow-up plan:   Return if symptoms worsen or fail to improve.      Interventional management options: Planned, scheduled, and/or pending:   Not at this time.   Considering:   Diagnostic bilateral lumbar facetblock #2  Possible bilateral lumbar facet RFA Diagnostic right CESI  Diagnostic bilateral cervical facetblock  Possible bilateral cervical facet RFA Diagnostic IA hip joint injection  Diagnostic bilateral femoral and obturator NB  Possible bilateral femoral and obturator nerve RFA Diagnostic right occipital NB Possible right occipital nerve RFA Possible right occipital peripheral nerve stimulator trial   Palliative PRN treatment(s):   Diagnostic/therapeutic bilateral lumbar facet block #2       Recent Visits No visits were found meeting these conditions. Showing recent visits within past 90 days and meeting all other requirements Today's Visits Date Type Provider Dept  08/19/20 Telemedicine Milinda Pointer, MD [facility]  Showing today's visits and meeting all other requirements Future Appointments No visits were found meeting these conditions. Showing future appointments within next 90 days and meeting all other requirements  I discussed the assessment and treatment plan with the patient. The patient was provided an opportunity to ask questions and all were answered. The patient agreed with the plan and demonstrated an understanding of the instructions.  Patient advised to call back or seek an in-person evaluation if the symptoms or condition worsens.  Duration of encounter: 12 minutes.  Note by: Gaspar Cola, MD Date: 08/19/2020; Time: 4:09 PM

## 2020-08-23 ENCOUNTER — Ambulatory Visit: Payer: Federal, State, Local not specified - PPO | Admitting: Pain Medicine

## 2020-08-24 ENCOUNTER — Ambulatory Visit: Payer: Federal, State, Local not specified - PPO | Admitting: Pain Medicine

## 2020-08-25 DIAGNOSIS — E782 Mixed hyperlipidemia: Secondary | ICD-10-CM | POA: Diagnosis not present

## 2020-08-25 DIAGNOSIS — J439 Emphysema, unspecified: Secondary | ICD-10-CM | POA: Diagnosis not present

## 2020-08-25 DIAGNOSIS — Z720 Tobacco use: Secondary | ICD-10-CM | POA: Diagnosis not present

## 2020-08-25 DIAGNOSIS — I10 Essential (primary) hypertension: Secondary | ICD-10-CM | POA: Diagnosis not present

## 2020-08-25 DIAGNOSIS — I714 Abdominal aortic aneurysm, without rupture: Secondary | ICD-10-CM | POA: Diagnosis not present

## 2020-08-25 DIAGNOSIS — I2510 Atherosclerotic heart disease of native coronary artery without angina pectoris: Secondary | ICD-10-CM | POA: Diagnosis not present

## 2020-08-31 ENCOUNTER — Encounter: Payer: Self-pay | Admitting: Obstetrics & Gynecology

## 2020-08-31 ENCOUNTER — Telehealth: Payer: Self-pay

## 2020-08-31 ENCOUNTER — Other Ambulatory Visit: Payer: Self-pay

## 2020-08-31 ENCOUNTER — Ambulatory Visit (INDEPENDENT_AMBULATORY_CARE_PROVIDER_SITE_OTHER): Payer: Medicare Other | Admitting: Obstetrics & Gynecology

## 2020-08-31 VITALS — BP 120/80 | Ht 68.5 in | Wt 255.0 lb

## 2020-08-31 DIAGNOSIS — N83299 Other ovarian cyst, unspecified side: Secondary | ICD-10-CM | POA: Insufficient documentation

## 2020-08-31 DIAGNOSIS — N83209 Unspecified ovarian cyst, unspecified side: Secondary | ICD-10-CM | POA: Insufficient documentation

## 2020-08-31 DIAGNOSIS — I6523 Occlusion and stenosis of bilateral carotid arteries: Secondary | ICD-10-CM

## 2020-08-31 DIAGNOSIS — D3911 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: Secondary | ICD-10-CM | POA: Diagnosis not present

## 2020-08-31 DIAGNOSIS — N83201 Unspecified ovarian cyst, right side: Secondary | ICD-10-CM

## 2020-08-31 NOTE — Patient Instructions (Signed)
Ovarian Cyst     An ovarian cyst is a fluid-filled sac that forms on an ovary. The ovaries are small organs that produce eggs in women. Various types of cysts can form on the ovaries. Some may cause symptoms and require treatment. Most ovarian cysts go away on their own, are not cancerous (are benign), and do not cause problems. Common types of ovarian cysts include:  Functional (follicle) cysts. ? Occur during the menstrual cycle, and usually go away with the next menstrual cycle if you do not get pregnant. ? Usually cause no symptoms.  Endometriomas. ? Are cysts that form from the tissue that lines the uterus (endometrium). ? Are sometimes called "chocolate cysts" because they become filled with blood that turns brown. ? Can cause pain in the lower abdomen during intercourse and during your period.  Cystadenoma cysts. ? Develop from cells on the outside surface of the ovary. ? Can get very large and cause lower abdomen pain and pain with intercourse. ? Can cause severe pain if they twist or break open (rupture).  Dermoid cysts. ? Are sometimes found in both ovaries. ? May contain different kinds of body tissue, such as skin, teeth, hair, or cartilage. ? Usually do not cause symptoms unless they get very big.  Theca lutein cysts. ? Occur when too much of a certain hormone (human chorionic gonadotropin) is produced and overstimulates the ovaries to produce an egg. ? Are most common after having procedures used to assist with the conception of a baby (in vitro fertilization). What are the causes? Ovarian cysts may be caused by:  Ovarian hyperstimulation syndrome. This is a condition that can develop from taking fertility medicines. It causes multiple large ovarian cysts to form.  Polycystic ovarian syndrome (PCOS). This is a common hormonal disorder that can cause ovarian cysts, as well as problems with your period or fertility. What increases the risk? The following factors may  make you more likely to develop ovarian cysts:  Being overweight or obese.  Taking fertility medicines.  Taking certain forms of hormonal birth control.  Smoking. What are the signs or symptoms? Many ovarian cysts do not cause symptoms. If symptoms are present, they may include:  Pelvic pain or pressure.  Pain in the lower abdomen.  Pain during sex.  Abdominal swelling.  Abnormal menstrual periods.  Increasing pain with menstrual periods. How is this diagnosed? These cysts are commonly found during a routine pelvic exam. You may have tests to find out more about the cyst, such as:  Ultrasound.  X-ray of the pelvis.  CT scan.  MRI.  Blood tests. How is this treated? Many ovarian cysts go away on their own without treatment. Your health care provider may want to check your cyst regularly for 2-3 months to see if it changes. If you are in menopause, it is especially important to have your cyst monitored closely because menopausal women have a higher rate of ovarian cancer. When treatment is needed, it may include:  Medicines to help relieve pain.  A procedure to drain the cyst (aspiration).  Surgery to remove the whole cyst.  Hormone treatment or birth control pills. These methods are sometimes used to help dissolve a cyst. Follow these instructions at home:  Take over-the-counter and prescription medicines only as told by your health care provider.  Do not drive or use heavy machinery while taking prescription pain medicine.  Get regular pelvic exams and Pap tests as often as told by your health care provider.    Return to your normal activities as told by your health care provider. Ask your health care provider what activities are safe for you.  Do not use any products that contain nicotine or tobacco, such as cigarettes and e-cigarettes. If you need help quitting, ask your health care provider.  Keep all follow-up visits as told by your health care provider.  This is important. Contact a health care provider if:  Your periods are late, irregular, or painful, or they stop.  You have pelvic pain that does not go away.  You have pressure on your bladder or trouble emptying your bladder completely.  You have pain during sex.  You have any of the following in your abdomen: ? A feeling of fullness. ? Pressure. ? Discomfort. ? Pain that does not go away. ? Swelling.  You feel generally ill.  You become constipated.  You lose your appetite.  You develop severe acne.  You start to have more body hair and facial hair.  You are gaining weight or losing weight without changing your exercise and eating habits.  You think you may be pregnant. Get help right away if:  You have abdominal pain that is severe or gets worse.  You cannot eat or drink without vomiting.  You suddenly develop a fever.  Your menstrual period is much heavier than usual. This information is not intended to replace advice given to you by your health care provider. Make sure you discuss any questions you have with your health care provider. Document Revised: 02/18/2018 Document Reviewed: 04/23/2016 Elsevier Patient Education  2020 Elsevier Inc.  

## 2020-08-31 NOTE — Telephone Encounter (Signed)
Left message advising patient that her last pap was 01/30/2018.

## 2020-08-31 NOTE — Progress Notes (Addendum)
HPI: Patient is a 64 y.o. G2P2 who LMP was No LMP recorded. Patient has had a hysterectomy., presents today for a problem visit.  She complains of recent findings of Right ovarion cyst by PET scan (for lung disorder and monitoring).  PET report: Gradually increased size of 5.8 cm right adnexal cystic lesion compared to prior CT in 2014. This lesion appears complex although it is suboptimally evaluated without IV contrast. This lesion shows no FDG uptake uptake, and is highly suspicious for low-grade cystic ovarian neoplasm. Suggest correlation with tumor markers, and recommend pelvic ultrasound for further characterization.  Pt has had symptoms of min to no recurring intermittent RLQ pain (mild hwne it occurs, she has OA and other pain disorders so it is hard for her to discern between what is cause of pain); no bloating or recent weight gain.Marland Kitchen  Previous evaluation: none. Prior Diagnosis: none. Previous Treatment: none. Hysterectomy age 54 for menometrorrhagia No h/o ovarian cysts Menopausal for several years  PMHx: She  has a past medical history of Allergy, Arthritis, Asthma, Depression, Diabetes mellitus without complication ([place]), Emphysema of lung ([place]), GERD (gastroesophageal reflux disease), Hyperlipidemia, Hypertension, Osteoporosis, and Tobacco abuse counseling. Also,  has a past surgical history that includes vascular stent (03/28/2011); Cardiac catheterization (3/210); Kidney stone surgery (1999); Vaginal hysterectomy; and Tubal ligation., family history includes Alcohol abuse in her father and sister; Breast cancer (age of onset: 58) in her sister; Cancer (age of onset: 42) in her sister; Coronary artery disease in her mother; Coronary artery disease (age of onset: 32) in her sister; Depression in her father; Heart attack in her mother and sister; Heart attack (age of onset: 39) in her father; Hyperlipidemia in her sister; Hypertension in her father, mother, and sister.,  reports that  she has been smoking cigarettes. She has a 66.75 pack-year smoking history. She has never used smokeless tobacco. She reports current alcohol use. She reports that she does not use drugs.  She has a current medication list which includes the following prescription(s): albuterol, allopurinol, aspirin, bisoprolol, bupropion, clopidogrel, cyanocobalamin, cyanocobalamin, gabapentin, ibuprofen, lisinopril-hydrochlorothiazide, loratadine, montelukast, oxycodone, ozempic (0.25 or 0.5 mg/dose), rosuvastatin, and ozempic (1 mg/dose). Also, is allergic to atorvastatin, omeprazole, and bupropion.  Review of Systems  Constitutional: Negative for chills, fever and malaise/fatigue.  HENT: Negative for congestion, sinus pain and sore throat.   Eyes: Negative for blurred vision and pain.  Respiratory: Negative for cough and wheezing.   Cardiovascular: Negative for chest pain and leg swelling.  Gastrointestinal: Negative for abdominal pain, constipation, diarrhea, heartburn, nausea and vomiting.  Genitourinary: Negative for dysuria, frequency, hematuria and urgency.  Musculoskeletal: Negative for back pain, joint pain, myalgias and neck pain.  Skin: Negative for itching and rash.  Neurological: Negative for dizziness, tremors and weakness.  Endo/Heme/Allergies: Does not bruise/bleed easily.  Psychiatric/Behavioral: Positive for depression. The patient is not nervous/anxious and does not have insomnia.     Objective: BP 120/80   Ht 5' 8.5" (1.74 m)   Wt 255 lb (115.7 kg)   BMI 38.21 kg/m  Physical Exam Constitutional:      General: She is not in acute distress.    Appearance: She is well-developed.  Genitourinary:     Pelvic exam was performed with patient supine.     Vagina normal.     No vaginal erythema or bleeding.     Genitourinary Comments: Cuff intact/ no lesions No mass palpated Absent uterus and cervix Mild cystocele  HENT:     Head: Normocephalic  and atraumatic.     Nose: Nose normal.    Abdominal:     General: There is no distension.     Palpations: Abdomen is soft.     Tenderness: There is no abdominal tenderness.  Musculoskeletal:        General: Normal range of motion.  Neurological:     Mental Status: She is alert and oriented to person, place, and time.     Cranial Nerves: No cranial nerve deficit.  Skin:    General: Skin is warm and dry.  Psychiatric:        Attention and Perception: Attention normal.        Mood and Affect: Mood normal.        Speech: Speech normal.        Behavior: Behavior normal.        Cognition and Memory: Cognition normal.        Judgment: Judgment normal.     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

## 2020-08-31 NOTE — Telephone Encounter (Signed)
Copied from [place] [phone]. Topic: General - Other >> Aug 31, 2020  8:06 AM Laura Nielsen E wrote: Reason for CRM: Pt would like to know when her last PAP was / please advise

## 2020-09-01 LAB — OVARIAN MALIGNANCY RISK-ROMA
Cancer Antigen (CA) 125: 8.9 U/mL (ref 0.0–38.1)
HE4: 94.6 pmol/L (ref 0.0–96.5)
Postmenopausal ROMA: 1.47
Premenopausal ROMA: 2.62 — ABNORMAL HIGH

## 2020-09-01 LAB — PREMENOPAUSAL INTERP: HIGH

## 2020-09-01 LAB — POSTMENOPAUSAL INTERP: LOW

## 2020-09-02 NOTE — Progress Notes (Signed)
Established patient visit   Patient: Laura Nielsen   DOB: [birth year]   64 y.o. Female  MRN: [id number] Visit Date: 09/03/2020  Today's healthcare provider: Lelon Huh, MD   Chief Complaint  Patient presents with  . Diabetes  . Hypertension   Subjective    HPI   Diabetes Mellitus Type II, Follow-up  Lab Results  Component Value Date   HGBA1C 6.0 (H) 04/20/2020   HGBA1C 6.1 (A) 06/10/2019   HGBA1C 6.0 (H) 02/12/2019   Wt Readings from Last 3 Encounters:  09/03/20 257 lb 3.2 oz (116.7 kg)  08/31/20 255 lb (115.7 kg)  07/30/20 253 lb (114.8 kg)   Last seen for diabetes 4 months ago.  Management since then includes no changes. She reports excellent compliance with treatment. She is not having side effects.    Home blood sugar records: not checking  Episodes of hypoglycemia? No    Current insulin regiment: Ozempic 0.5mg  Most Recent Eye Exam: not utd Current exercise: none Current diet habits: on average, 1.5 meals per day  Pertinent Labs: Lab Results  Component Value Date   CHOL 126 04/20/2020   HDL 36 (L) 04/20/2020   LDLCALC 66 04/20/2020   TRIG 136 04/20/2020   CHOLHDL 3.5 04/20/2020   Lab Results  Component Value Date   NA 141 04/20/2020   K 3.8 04/20/2020   CREATININE 0.67 04/20/2020   GFRNONAA 94 04/20/2020   GFRAA 108 04/20/2020   GLUCOSE 91 04/20/2020     --------------------------------------------------------------------------------------------------- Lipid/Cholesterol, Follow-up  She was last seen for this 4 months ago.  Management since that visit includespreviously stopped atorvastatin due to increase in blood sugar. Consider trial of a different  tatin after reviewing labs.  .  She reports excellent compliance with treatment. She is not having side effects.    Current diet: on average, 1.5 meals per day Current exercise: none  The ASCVD Risk score (Vaughn., et al., 2013) failed to calculate for the following reasons:    The valid total cholesterol range is 130 to 320 mg/dL  --------------------------------------------------------------------------------------------------- Hypertension, follow-up  BP Readings from Last 3 Encounters:  09/03/20 117/77  08/31/20 120/80  05/06/20 138/83   Wt Readings from Last 3 Encounters:  09/03/20 257 lb 3.2 oz (116.7 kg)  08/31/20 255 lb (115.7 kg)  07/30/20 253 lb (114.8 kg)     She was last seen for hypertension 4 months ago.  BP at that visit was 132/74. Management since that visit includes no changes.  She reports excellent compliance with treatment. She is not having side effects.  She is following a Regular diet. She is not exercising. She does smoke.  Use of agents associated with hypertension: none.   Outside blood pressures are not being checked.   --------------------------------------------------------------------------------------------------- Social History   Tobacco Use  . Smoking status: Current Every Day Smoker    Packs/day: 1.50    Years: 44.50    Pack years: 66.75    Types: Cigarettes  . Smokeless tobacco: Never Used  . Tobacco comment: 12/23/19 states she quit for 3 months and then started back. Pt is going to talk with MD about this.  Vaping Use  . Vaping Use: Never used  Substance Use Topics  . Alcohol use: Yes    Comment: 1 drink every 3-4 months  . Drug use: No       Medications: Outpatient Medications Prior to Visit  Medication Sig Note  . albuterol (PROVENTIL HFA) 108 (90 Base) MCG/ACT  inhaler Inhale 2 puffs into the lungs every 6 (six) hours as needed for wheezing or shortness of breath.   . allopurinol (ZYLOPRIM) 100 MG tablet Take 1 tablet (100 mg total) by mouth daily.   Marland Kitchen aspirin 81 MG tablet Take 81 mg by mouth daily.    . bisoprolol (ZEBETA) 10 MG tablet Take 1 tablet (10 mg total) by mouth daily.   . clopidogrel (PLAVIX) 75 MG tablet Take 1 tablet (75 mg total) by mouth daily.   . cyanocobalamin 1000 MCG tablet  Take 1,000 mcg by mouth daily.   Marland Kitchen gabapentin (NEURONTIN) 300 MG capsule Take 1 capsule (300 mg total) by mouth 2 (two) times daily AND 2 capsules (600 mg total) at bedtime. Take 1 capsule (300 mg total) by mouth 3 (three) times daily.. (Patient taking differently: Take 1 capsule (300 mg total) by mouth 2 (two) times daily AND 2 capsules (600 mg total) at bedtime.)   . Ibuprofen 200 MG CAPS Take by mouth.    Marland Kitchen lisinopril-hydrochlorothiazide (ZESTORETIC) 20-12.5 MG tablet Take 1 tablet by mouth daily.   Marland Kitchen loratadine (CLARITIN) 10 MG tablet Take 10 mg by mouth daily as needed.    Marland Kitchen oxyCODONE (ROXICODONE) 5 MG immediate release tablet Take 1 tablet (5 mg total) by mouth daily as needed for severe pain.   . rosuvastatin (CRESTOR) 20 MG tablet Take 1 tablet (20 mg total) by mouth daily.   . Semaglutide, 1 MG/DOSE, (OZEMPIC, 1 MG/DOSE,) 2 MG/1.5ML SOPN Inject 1 mg into the skin once a week.   . [DISCONTINUED] OZEMPIC, 0.25 OR 0.5 MG/DOSE, 2 MG/1.5ML SOPN Inject 0.5 mg into the skin once a week. 09/03/2020: HAS INCREASED TO 1MG  DAILY  . cyanocobalamin (,VITAMIN B-12,) 1000 MCG/ML injection Inject 76ml IM once a week for four weeks then once a month for four months. (Patient not taking: Reported on 09/03/2020)   . montelukast (SINGULAIR) 10 MG tablet Take 1 tablet (10 mg total) by mouth at bedtime. (Patient not taking: Reported on 09/03/2020)   . [DISCONTINUED] buPROPion (WELLBUTRIN XL) 150 MG 24 hr tablet Take 1 tablet (150 mg total) by mouth daily. For 8 days, then increase to 2 daily (Patient not taking: Reported on 09/03/2020)    No facility-administered medications prior to visit.      Objective    BP 117/77 (BP Location: Right Arm, Patient Position: Sitting, Cuff Size: Large)   Pulse 76   Temp 97.9 F (36.6 C) (Oral)   Ht 5\' 9"  (1.753 m)   Wt 257 lb 3.2 oz (116.7 kg)   BMI 37.98 kg/m    Physical Exam  General appearance: Obese female, cooperative and in no acute distress Head: Normocephalic,  without obvious abnormality, atraumatic Respiratory: Respirations even and unlabored, normal respiratory rate Extremities: All extremities are intact.  Skin: Skin color, texture, turgor normal. No rashes seen  Psych: Appropriate mood and affect. Neurologic: Mental status: Alert, oriented to person, place, and time, thought content appropriate.   Results for orders placed or performed in visit on 09/03/20  POCT HgB A1C  Result Value Ref Range   Hemoglobin A1C 6.0 (A) 4.0 - 5.6 %    Assessment & Plan     1. Type 2 diabetes mellitus without complication, without long-term current use of insulin (HCC) Very well controlled current regiment of Ozempic. Continue current medications.  Recheck a1c in 4 months.   2. Smoking greater than 30 pack years She did not tolerating 2 x 150 bupropion, but it did  take her cigarette cravings a well. She stopped after just a few days. Will start back on 1 tablet daily  Which she tolerated well, but give it a month before deciding whether to go up to 300mg  tabletes.  - buPROPion (WELLBUTRIN XL) 150 MG 24 hr tablet; Take 1 tablet (150 mg total) by mouth daily.  Dispense: 30 tablet; Refill: 0  3. Benign essential HTN Well controlled.  Continue current medications.    4. Hypercholesteremia She is tolerating rosuvastatin well with no adverse effects.    5.  Colon cancer screening  - Cologuard  7. Need for influenza vaccination  - Flu Vaccine QUAD 36+ mos IM (Fluarix/Fluzone)           Lelon Huh, MD  [facility] [phone] (phone) [phone] (fax)  [place]

## 2020-09-03 ENCOUNTER — Ambulatory Visit (INDEPENDENT_AMBULATORY_CARE_PROVIDER_SITE_OTHER): Payer: Medicare Other | Admitting: Family Medicine

## 2020-09-03 ENCOUNTER — Other Ambulatory Visit: Payer: Self-pay

## 2020-09-03 ENCOUNTER — Encounter: Payer: Self-pay | Admitting: Family Medicine

## 2020-09-03 VITALS — BP 117/77 | HR 76 | Temp 97.9°F | Ht 69.0 in | Wt 257.2 lb

## 2020-09-03 DIAGNOSIS — Z1211 Encounter for screening for malignant neoplasm of colon: Secondary | ICD-10-CM

## 2020-09-03 DIAGNOSIS — I6523 Occlusion and stenosis of bilateral carotid arteries: Secondary | ICD-10-CM

## 2020-09-03 DIAGNOSIS — I10 Essential (primary) hypertension: Secondary | ICD-10-CM

## 2020-09-03 DIAGNOSIS — Z23 Encounter for immunization: Secondary | ICD-10-CM

## 2020-09-03 DIAGNOSIS — E119 Type 2 diabetes mellitus without complications: Secondary | ICD-10-CM

## 2020-09-03 DIAGNOSIS — F17210 Nicotine dependence, cigarettes, uncomplicated: Secondary | ICD-10-CM | POA: Diagnosis not present

## 2020-09-03 DIAGNOSIS — E7800 Pure hypercholesterolemia, unspecified: Secondary | ICD-10-CM

## 2020-09-03 LAB — POCT GLYCOSYLATED HEMOGLOBIN (HGB A1C): Hemoglobin A1C: 6 % — AB (ref 4.0–5.6)

## 2020-09-03 MED ORDER — BUPROPION HCL ER (XL) 150 MG PO TB24: 150.0000 mg | ORAL_TABLET | Freq: Every day | ORAL | 0 refills | Status: DC

## 2020-09-03 NOTE — Patient Instructions (Signed)
.   Please review the attached list of medications and notify my office if there are any errors.   . Please bring all of your medications to every appointment so we can make sure that our medication list is the same as yours.    You are due for Cologuard to screen for colon cancer. A collection kit should arrive at your home within the next few weeks.

## 2020-09-08 ENCOUNTER — Other Ambulatory Visit: Payer: Self-pay

## 2020-09-08 ENCOUNTER — Encounter: Payer: Self-pay | Admitting: Obstetrics & Gynecology

## 2020-09-08 ENCOUNTER — Ambulatory Visit (INDEPENDENT_AMBULATORY_CARE_PROVIDER_SITE_OTHER): Payer: Medicare Other

## 2020-09-08 ENCOUNTER — Ambulatory Visit (INDEPENDENT_AMBULATORY_CARE_PROVIDER_SITE_OTHER): Payer: Medicare Other | Admitting: Obstetrics & Gynecology

## 2020-09-08 DIAGNOSIS — D3911 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: Secondary | ICD-10-CM

## 2020-09-08 DIAGNOSIS — N83201 Unspecified ovarian cyst, right side: Secondary | ICD-10-CM | POA: Diagnosis not present

## 2020-09-08 NOTE — Progress Notes (Signed)
HPI: Pt is a 64 yo G2P2 WF who has had symptoms of min to no recurring intermittent RLQ pain (mild when it occurs, she has OA and other pain disorders so it is hard for her to discern between what is cause of pain); no bloating or recent weight gain.  She had a PET for other reasons )lung d/o) that revealed right adnexal cystic mass.  Recent ROMA (w CA125) normal or low risk.  Ultrasound demonstrates right sided complex cystic mass, see below.  PMHx: She  has a past medical history of Allergy, Arthritis, Asthma, Depression, Diabetes mellitus without complication ([place]), Emphysema of lung ([place]), GERD (gastroesophageal reflux disease), Hyperlipidemia, Hypertension, Osteoporosis, and Tobacco abuse counseling. Also,  has a past surgical history that includes vascular stent (03/28/2011); Cardiac catheterization (3/210); Kidney stone surgery (1999); Vaginal hysterectomy; and Tubal ligation., family history includes Alcohol abuse in her father and sister; Breast cancer (age of onset: 93) in her sister; Cancer (age of onset: 84) in her sister; Coronary artery disease in her mother; Coronary artery disease (age of onset: 12) in her sister; Depression in her father; Heart attack in her mother and sister; Heart attack (age of onset: 66) in her father; Hyperlipidemia in her sister; Hypertension in her father, mother, and sister.,  reports that she has been smoking cigarettes. She has a 66.75 pack-year smoking history. She has never used smokeless tobacco. She reports current alcohol use. She reports that she does not use drugs.  She has a current medication list which includes the following prescription(s): albuterol, allopurinol, aspirin, bisoprolol, bupropion, clopidogrel, cyanocobalamin, cyanocobalamin, gabapentin, ibuprofen, lisinopril-hydrochlorothiazide, loratadine, montelukast, oxycodone, rosuvastatin, and ozempic (1 mg/dose). Also, is allergic to atorvastatin, omeprazole, and bupropion.  Review of Systems    All other systems reviewed and are negative.   Objective: There were no vitals taken for this visit.  Physical examination Constitutional NAD, Conversant  Skin No rashes, lesions or ulceration.   Extremities: Moves all appropriately.  Normal ROM for age. No lymphadenopathy.  Neuro: Grossly intact  Psych: Oriented to PPT.  Normal mood. Normal affect.   US PELVIC COMPLETE WITH TRANSVAGINAL  Result Date: 09/08/2020 Patient Name: Laura Nielsen DOB: 11-Dec-1955 MRN: [id number] ULTRASOUND REPORT Location: [facility] Date of Service: 09/08/2020 Indications:Evaluate Right Ovary Mass Findings: The uterus and cervix have been removed. There are three echogenic foci seen within the vaginal cuff measuring 5.9 mm, 6.0 mm and 4.8 mm. Right Ovary measures 6.9 x 5.2 x 5.6 cm. It is not normal in appearance. There is a complex mass with anechoic fluid and thick sepations in the right ovary. No blood flow is seen within this mass. It measures 63.3 x 51.6 x 46.2 mm. The Left Ovary is not visible. Survey of the adnexa demonstrates no adnexal masses. There is no free fluid in the cul de sac. Impression: 1. The uterus and cervix have been removed. 2. There is a 6.3 cm complex mass in the right ovary. 3. The left ovary is not visible. Recommendations: 1.Clinical correlation with the patient's History and Physical Exam. Gweneth Dimitri, RT Review of ULTRASOUND.    I have personally reviewed images and report of recent ultrasound done at [facility].    Plan of management to be discussed with patient. 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  Reassuring blood  test but it is not cmnclusive. - Plan: Ambulatory referral to Gynecologic Oncology - Consider surgery.  Pt is high risk for surgery, preferrable to have preop clearance  and perhaps surgery at tertiary care center.  A total of 20 minutes were spent face-to-face with the patient as well as preparation, review, communication, and documentation during this encounter.   Barnett Applebaum, MD, [facility], [facility] 09/08/2020  12:03 PM

## 2020-09-22 ENCOUNTER — Inpatient Hospital Stay: Payer: Medicare Other | Attending: Obstetrics and Gynecology | Admitting: Obstetrics and Gynecology

## 2020-09-22 ENCOUNTER — Other Ambulatory Visit: Payer: Self-pay

## 2020-09-22 VITALS — BP 136/79 | HR 70 | Temp 98.0°F | Resp 20 | Wt 255.1 lb

## 2020-09-22 DIAGNOSIS — E7800 Pure hypercholesterolemia, unspecified: Secondary | ICD-10-CM | POA: Insufficient documentation

## 2020-09-22 DIAGNOSIS — E1151 Type 2 diabetes mellitus with diabetic peripheral angiopathy without gangrene: Secondary | ICD-10-CM | POA: Insufficient documentation

## 2020-09-22 DIAGNOSIS — M25551 Pain in right hip: Secondary | ICD-10-CM | POA: Diagnosis not present

## 2020-09-22 DIAGNOSIS — I70219 Atherosclerosis of native arteries of extremities with intermittent claudication, unspecified extremity: Secondary | ICD-10-CM | POA: Insufficient documentation

## 2020-09-22 DIAGNOSIS — N83299 Other ovarian cyst, unspecified side: Secondary | ICD-10-CM

## 2020-09-22 DIAGNOSIS — M47816 Spondylosis without myelopathy or radiculopathy, lumbar region: Secondary | ICD-10-CM | POA: Diagnosis not present

## 2020-09-22 DIAGNOSIS — I700 Atherosclerosis of aorta: Secondary | ICD-10-CM | POA: Diagnosis not present

## 2020-09-22 DIAGNOSIS — K760 Fatty (change of) liver, not elsewhere classified: Secondary | ICD-10-CM | POA: Insufficient documentation

## 2020-09-22 DIAGNOSIS — M25552 Pain in left hip: Secondary | ICD-10-CM | POA: Insufficient documentation

## 2020-09-22 DIAGNOSIS — Z6837 Body mass index (BMI) 37.0-37.9, adult: Secondary | ICD-10-CM | POA: Insufficient documentation

## 2020-09-22 DIAGNOSIS — G894 Chronic pain syndrome: Secondary | ICD-10-CM | POA: Insufficient documentation

## 2020-09-22 DIAGNOSIS — Z780 Asymptomatic menopausal state: Secondary | ICD-10-CM | POA: Diagnosis not present

## 2020-09-22 DIAGNOSIS — Z90710 Acquired absence of both cervix and uterus: Secondary | ICD-10-CM | POA: Insufficient documentation

## 2020-09-22 DIAGNOSIS — D3911 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: Secondary | ICD-10-CM

## 2020-09-22 DIAGNOSIS — M546 Pain in thoracic spine: Secondary | ICD-10-CM | POA: Diagnosis not present

## 2020-09-22 DIAGNOSIS — N9489 Other specified conditions associated with female genital organs and menstrual cycle: Secondary | ICD-10-CM

## 2020-09-22 DIAGNOSIS — M489 Spondylopathy, unspecified: Secondary | ICD-10-CM | POA: Insufficient documentation

## 2020-09-22 DIAGNOSIS — M542 Cervicalgia: Secondary | ICD-10-CM | POA: Diagnosis not present

## 2020-09-22 DIAGNOSIS — R0681 Apnea, not elsewhere classified: Secondary | ICD-10-CM | POA: Insufficient documentation

## 2020-09-22 DIAGNOSIS — Z79899 Other long term (current) drug therapy: Secondary | ICD-10-CM | POA: Insufficient documentation

## 2020-09-22 DIAGNOSIS — I10 Essential (primary) hypertension: Secondary | ICD-10-CM | POA: Diagnosis not present

## 2020-09-22 DIAGNOSIS — E538 Deficiency of other specified B group vitamins: Secondary | ICD-10-CM | POA: Insufficient documentation

## 2020-09-22 DIAGNOSIS — F3341 Major depressive disorder, recurrent, in partial remission: Secondary | ICD-10-CM | POA: Diagnosis not present

## 2020-09-22 DIAGNOSIS — F17210 Nicotine dependence, cigarettes, uncomplicated: Secondary | ICD-10-CM

## 2020-09-22 DIAGNOSIS — E559 Vitamin D deficiency, unspecified: Secondary | ICD-10-CM | POA: Diagnosis not present

## 2020-09-22 DIAGNOSIS — Z7982 Long term (current) use of aspirin: Secondary | ICD-10-CM | POA: Insufficient documentation

## 2020-09-22 DIAGNOSIS — I714 Abdominal aortic aneurysm, without rupture: Secondary | ICD-10-CM | POA: Diagnosis not present

## 2020-09-22 DIAGNOSIS — Z7902 Long term (current) use of antithrombotics/antiplatelets: Secondary | ICD-10-CM | POA: Insufficient documentation

## 2020-09-22 DIAGNOSIS — K219 Gastro-esophageal reflux disease without esophagitis: Secondary | ICD-10-CM | POA: Diagnosis not present

## 2020-09-22 NOTE — Progress Notes (Signed)
Gynecologic Oncology Consult Visit   Referring Provider: Dr. Kenton Kingfisher  Chief Complaint: Incidental Right Sided Complex Cystic Mass  Subjective:  Laura Nielsen is a 64 y.o. G2P2 female who is seen in consultation from Dr. Kenton Kingfisher for incidental right sided 6 cm complex cystic mass.   She underwent Low dose CT/Lung Cancer Screening which showed volume derived mean 9.8 mm nodule in the left lower lobe, increased in size. 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  No symptoms.   She was seen By Dr. Kenton Kingfisher.  Ultrasound:  Right ovary: 6.9 x 5.2 x 5.6 normal appearing. Complex mass with anechoic fluid and thick septations in the right ovary. No blood flow seen. Measures 6.33 x 5.16 x 4.62 cm.  Left ovary not visualized. No free fluid. Uterus and cervix surgically removed.   CA 125 - 8.9 HE4- 94.6 Post menopausal ROMA 1.47 = 14% (normal)  Patient felt to be high risk for surgery and Dr. Kenton Kingfisher referred her to gynecologic oncology for evaluation.   Prior hysterectomy for bleeding 35 years ago.   Problem List: Patient Active Problem List   Diagnosis Date Noted  . Ovarian cyst, right 08/31/2020  . 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 Pharmacologic therapy 08/19/2020  . Disorder of skeletal system 08/19/2020  . Problems influencing health status 08/19/2020  . Fatty liver 08/09/2020  . Recurrent major depressive disorder, in partial remission ([place]) 04/20/2020  . Aortic atherosclerosis ([place]) 07/17/2019  . Unspecified inflammatory spondylopathy, lumbar region ([place]) 10/04/2018  . Abnormal MRI, lumbar spine (03/28/2017) 07/04/2017  . Abnormal MRI, cervical spine (03/28/2017) 07/04/2017  . Lumbar facet syndrome (Bilateral) (L>R) 04/26/2017  . Lumbar facet hypertrophy (multilevel)  (Bilateral) 04/26/2017  . Long term current use of anticoagulant therapy 04/26/2017  . Neurogenic pain 04/26/2017  . Musculoskeletal pain 04/26/2017  . Chronic pain syndrome 03/08/2017  . Long term prescription opiate use 03/08/2017  . Opiate use 03/08/2017  . Chronic low back pain (Primary Area of Pain) (Bilateral) (L>R) 03/08/2017  . Chronic neck pain (Secondary area of Pain) (Bilateral) (R>L) 03/08/2017  . Cervicogenic headache (Right) 03/08/2017  . Chronic upper back pain (Third area of Pain) (Bilateral) (R>L) 03/08/2017  . Chronic hip pain (Bilateral) (L>R) 03/08/2017  . Osteoarthritis of hip (Bilateral) (L>R) 03/08/2017  . Cervical central spinal stenosis 03/08/2017  . Cervical spondylosis with radiculopathy (Right) (C5) 03/08/2017  . Lumbar spondylosis 03/08/2017  . Atherosclerosis of native arteries of extremity with intermittent claudication ([place]) 02/22/2017  . Gout 02/16/2016  . Ankle pain 11/17/2015  . Arthritis 05/14/2015  . Carotid artery narrowing 05/14/2015  . Claudication ([place]) 05/14/2015  . CAFL (chronic airflow limitation) ([place]) 05/14/2015  . Clinical depression 05/14/2015  . Emphysema lung ([place]) 05/14/2015  . Acid reflux 05/14/2015  . Urinary incontinence 05/14/2015  . Pins and needles sensation 05/14/2015  . Obstructive apnea 05/14/2015  . Morbid obesity ([place]) 05/14/2015  . History of abnormal cervical Papanicolaou smear 05/14/2015  . Peripheral blood vessel disorder ([place]) 05/14/2015  . B12 deficiency 05/14/2015  . Vitamin D insufficiency 05/14/2015  . AAA (abdominal aortic aneurysm) without rupture ([place]) 05/26/2014  . Aortic heart valve narrowing 01/31/2013  . Malaise and fatigue 09/26/2012  . CAD in native artery 03/17/2009  . Diabetes mellitus, type 2 ([place]) 02/05/2009  . Hypercholesteremia 06/24/2008  .  Allergic rhinitis 10/25/2007  . Airway hyperreactivity 10/25/2007  . Smoking greater than 30 pack years 10/25/2007  . Narrowing of intervertebral disc  space 10/25/2007  . Benign essential HTN 10/25/2007  . Arthritis, degenerative 10/25/2007    Past Medical History: Past Medical History:  Diagnosis Date  . Allergy   . Arthritis   . Asthma   . Depression   . Diabetes mellitus without complication ([place])   . Emphysema of lung ([place])   . GERD (gastroesophageal reflux disease)   . Hyperlipidemia   . Hypertension   . Osteoporosis   . Tobacco abuse counseling     Past Surgical History: Past Surgical History:  Procedure Laterality Date  . Cardiac catheterization  3/210   70-80% stenosis RCA stent placed. started on Plavix  . [place]  . TUBAL LIGATION    . VAGINAL HYSTERECTOMY     Menometrorrhagia. Excessive bleeding. Unknown if cervix removed.   . vascular stent  03/28/2011   Dr. Delana Meyer, [facility]; Infrarenal     OB History:  OB History  Gravida Para Term Preterm AB Living  2 2          SAB TAB Ectopic Multiple Live Births               # Outcome Date GA Lbr Len/2nd Weight Sex Delivery Anes PTL Lv  2 Para           1 Para             Family History: Family History  Problem Relation Age of Onset  . Hypertension Mother   . Coronary artery disease Mother   . Heart attack Mother        acute  . Cancer Mother   . Alcohol abuse Father   . Depression Father   . Hypertension Father   . Heart attack Father 49       acute  . Alcohol abuse Sister   . Hyperlipidemia Sister   . Hypertension Sister   . Cancer Sister 31  . Heart attack Sister        x's 2  . Coronary artery disease Sister 80       x's 2  . Breast cancer Sister 47    Social History: Social History   Socioeconomic History  . Marital status: Divorced    Spouse name: Not on file  . Number of children: 2  . Years of education: H/S  . Highest education level: High school graduate  Occupational History  . Occupation: Disabled  . Occupation: retired  Tobacco Use  . Smoking status: Current Every Day Smoker    Packs/day: 1.50     Years: 44.50    Pack years: 66.75    Types: Cigarettes  . Smokeless tobacco: Never Used  . Tobacco comment: 12/23/19 states she quit for 3 months and then started back. Pt is going to talk with MD about this.  Vaping Use  . Vaping Use: Never used  Substance and Sexual Activity  . Alcohol use: Yes    Comment: 1 drink every 3-4 months  . Drug use: No  . Sexual activity: Not on file  Other Topics Concern  . Not on file  Social History Narrative  . Not on file   Social Determinants of Health   Financial Resource Strain: Low Risk   . Difficulty of Paying Living Expenses: Not hard at all  Food Insecurity: No Food Insecurity  . Worried About Estate manager/land agent  of Food in the Last Year: Never true  . Ran Out of Food in the Last Year: Never true  Transportation Needs: No Transportation Needs  . Lack of Transportation (Medical): No  . Lack of Transportation (Non-Medical): No  Physical Activity: Inactive  . Days of Exercise per Week: 0 days  . Minutes of Exercise per Session: 0 min  Stress: No Stress Concern Present  . Feeling of Stress : Only a little  Social Connections: Socially Isolated  . Frequency of Communication with Friends and Family: More than three times a week  . Frequency of Social Gatherings with Friends and Family: Never  . Attends Religious Services: Never  . Active Member of Clubs or Organizations: No  . Attends Archivist Meetings: Never  . Marital Status: Divorced  Human resources officer Violence: Not At Risk  . Fear of Current or Ex-Partner: No  . Emotionally Abused: No  . Physically Abused: No  . Sexually Abused: No    Allergies: Allergies  Allergen Reactions  . Atorvastatin Other (See Comments)    Elevated blood sugar Elevated blood sugar  . Omeprazole Nausea And Vomiting  . Bupropion Nausea Only    Current Medications: Current Outpatient Medications  Medication Sig Dispense Refill  . albuterol (PROVENTIL HFA) 108 (90 Base) MCG/ACT inhaler Inhale 2  puffs into the lungs every 6 (six) hours as needed for wheezing or shortness of breath. 18 g 11  . allopurinol (ZYLOPRIM) 100 MG tablet Take 1 tablet (100 mg total) by mouth daily. 90 tablet 4  . aspirin 81 MG tablet Take 81 mg by mouth daily.     . bisoprolol (ZEBETA) 10 MG tablet Take 1 tablet (10 mg total) by mouth daily. 90 tablet 0  . buPROPion (WELLBUTRIN XL) 150 MG 24 hr tablet Take 1 tablet (150 mg total) by mouth daily. 30 tablet 0  . Cholecalciferol 25 MCG (1000 UT) capsule Take by mouth.    . clopidogrel (PLAVIX) 75 MG tablet Take 1 tablet (75 mg total) by mouth daily. 90 tablet 0  . cyanocobalamin 1000 MCG tablet Take 1,000 mcg by mouth daily.    Marland Kitchen gabapentin (NEURONTIN) 300 MG capsule Take 1 capsule (300 mg total) by mouth 2 (two) times daily AND 2 capsules (600 mg total) at bedtime. Take 1 capsule (300 mg total) by mouth 3 (three) times daily.. (Patient taking differently: Take 1 capsule (300 mg total) by mouth 2 (two) times daily AND 2 capsules (600 mg total) at bedtime.) 120 capsule 5  . Ibuprofen 200 MG CAPS Take by mouth.     Marland Kitchen lisinopril-hydrochlorothiazide (ZESTORETIC) 20-12.5 MG tablet Take 1 tablet by mouth daily. 90 tablet 0  . loratadine (CLARITIN) 10 MG tablet Take 10 mg by mouth daily as needed.     . rosuvastatin (CRESTOR) 20 MG tablet Take 1 tablet (20 mg total) by mouth daily. 90 tablet 0  . Semaglutide, 1 MG/DOSE, (OZEMPIC, 1 MG/DOSE,) 2 MG/1.5ML SOPN Inject 1 mg into the skin once a week. 2 pen 11  . cyanocobalamin (,VITAMIN B-12,) 1000 MCG/ML injection Inject 77ml IM once a week for four weeks then once a month for four months. (Patient not taking: Reported on 09/03/2020)    . montelukast (SINGULAIR) 10 MG tablet Take 1 tablet (10 mg total) by mouth at bedtime. (Patient not taking: Reported on 09/03/2020) 30 tablet 1  . oxyCODONE (ROXICODONE) 5 MG immediate release tablet Take 1 tablet (5 mg total) by mouth daily as needed for severe pain.  15 tablet 0   No current  facility-administered medications for this visit.    General: negative for fevers, changes in weight or night sweats Skin: negative for changes in moles or sores or rash Eyes: negative for changes in vision HEENT: negative for change in hearing, tinnitus, voice changes Pulmonary: negative for dyspnea, orthopnea, productive cough, wheezing Cardiac: negative for palpitations, pain Gastrointestinal: negative for nausea, vomiting, constipation, diarrhea, hematemesis, hematochezia Genitourinary/Sexual: negative for dysuria, retention, hematuria, incontinence Ob/Gyn:  negative for abnormal bleeding, or pain Musculoskeletal: negative for pain, joint pain, back pain Hematology: negative for easy bruising, abnormal bleeding Neurologic/Psych: negative for headaches, seizures, paralysis, weakness, numbness   Objective:  Physical Examination:  BP 136/79   Pulse 70   Temp 98 F (36.7 C)   Resp 20   Wt 255 lb 1.6 oz (115.7 kg)   SpO2 100%   BMI 37.67 kg/m     ECOG Performance Status: 2 - Symptomatic, <50% confined to bed  GENERAL: Patient is a well appearing female in no acute distress, obese HEENT:  PERRL, neck supple with midline trachea. Thyroid without masses.  NODES:  No cervical, supraclavicular, axillary, or inguinal lymphadenopathy palpated.  LUNGS:  Clear to auscultation bilaterally.  No wheezes or rhonchi. HEART:  Regular rate and rhythm. No murmur appreciated. ABDOMEN:  Soft, nontender.  Positive, normoactive bowel sounds.  MSK:  No focal spinal tenderness to palpation. Full range of motion bilaterally in the upper extremities. EXTREMITIES:  No peripheral edema.   SKIN:  Clear with no obvious rashes or skin changes. No nail dyscrasia. NEURO:  Nonfocal. Well oriented.  Appropriate affect.  Pelvic: Chaperoned by Nursing EGBUS: no lesions Cervix: no lesions, nontender, mobile Vagina: no lesions, no discharge or bleeding Uterus: normal size, nontender, mobile Adnexa: no  palpable masses Rectovaginal: confirmatory  Lab Review No labs on site today.   Radiologic Imaging: No imaging on site today    Assessment:  Laura Nielsen is a 64 y.o. female diagnosed with incidentally found 6 cm right adnexal cystic mass on PET/CT done for follow up of lung nodule seen on lung cancer screening scan.   Prior CT scan in 2014 showed this mass and it was 3.8 cm.  Adnexal mass is PET negative and US shows it to be multicystic with septations.  Normal tumor markers HE4 and CA125.  She is asymptomatic.   In view of negative tumor markers, small size of the mass and stability over time and lack of PET activity the risk of cancer is very low.  And she is asymptomatic.    Prior hysterectomy for bleeding 35 years ago.   Medical co-morbidities complicating care: smoker, chronic back pain, BMI 37, aortic valve narrowing on Plavix , depression, fatty liver.  Plan:   Problem List Items Addressed This Visit      Endocrine   Complex ovarian cyst - Primary     Other   Adnexal mass      We discussed options for management including surveillance [place] and CA125 with Dr Kenton Kingfisher in 6 months in view of low risk of cancer and lack of symptoms.  The patient's diagnosis, an outline of the further diagnostic and laboratory studies which will be required, the recommendation for surgery, and alternatives were discussed with her and her accompanying family members.  All questions were answered to their satisfaction.  A total of 40 minutes were spent with the patient/family today; 60% was spent in education, counseling and coordination of care for adnexal cyst.  Verlon Au, NP  I personally interviewed and examined the patient. Agreed with the above/below plan of care. I have directly contributed to assessment and plan of care of this patient and educated and discussed with patient and family.  Mellody Drown, MD     CC:  Gae Dry, MD [address] [place],  [place]  [phone]

## 2020-09-22 NOTE — Patient Instructions (Signed)
Follow up with Dr. Kenton Kingfisher in 6 months.

## 2020-09-23 ENCOUNTER — Telehealth: Payer: Self-pay | Admitting: Obstetrics & Gynecology

## 2020-09-23 ENCOUNTER — Other Ambulatory Visit: Payer: Self-pay | Admitting: Obstetrics & Gynecology

## 2020-09-23 DIAGNOSIS — N83201 Unspecified ovarian cyst, right side: Secondary | ICD-10-CM

## 2020-09-23 DIAGNOSIS — Z1211 Encounter for screening for malignant neoplasm of colon: Secondary | ICD-10-CM | POA: Diagnosis not present

## 2020-09-23 NOTE — Telephone Encounter (Signed)
Patient is scheduled for 03/08/21 for GYN [place] and [place] f/u . Please place order. Thank you

## 2020-09-23 NOTE — Telephone Encounter (Signed)
Called and left voicemail for patient to call back to be scheduled. 

## 2020-09-23 NOTE — Telephone Encounter (Signed)
-----   Message from Gae Dry, MD sent at 09/23/2020  9:39 AM EDT ----- Regarding: Appt Sch GYN [place] and [place] f/u Appt in 03/2021  ----- Message ----- From: Mellody Drown, MD Sent: 09/22/2020  10:10 AM EDT To: Gae Dry, MD, Birdie Sons, MD

## 2020-09-25 LAB — COLOGUARD: Cologuard: POSITIVE — AB

## 2020-10-03 ENCOUNTER — Other Ambulatory Visit: Payer: Self-pay | Admitting: Family Medicine

## 2020-10-03 DIAGNOSIS — I10 Essential (primary) hypertension: Secondary | ICD-10-CM

## 2020-10-03 NOTE — Telephone Encounter (Signed)
Requested Prescriptions  Pending Prescriptions Disp Refills  . clopidogrel (PLAVIX) 75 MG tablet [Pharmacy Med Name: CLOPIDOGREL 75 MG TABLET] 90 tablet 0    Sig: Take 1 tablet (75 mg total) by mouth daily.     Hematology: Antiplatelets - clopidogrel Failed - 10/03/2020 11:30 AM      Failed - Evaluate AST, ALT within 2 months of therapy initiation.      Failed - HCT in normal range and within 180 days    Hematocrit  Date Value Ref Range Status  04/20/2020 49.3 (H) 34.0 - 46.6 % Final         Failed - HGB in normal range and within 180 days    Hemoglobin  Date Value Ref Range Status  04/20/2020 16.5 (H) 11.1 - 15.9 g/dL Final         Passed - ALT in normal range and within 360 days    ALT  Date Value Ref Range Status  04/20/2020 29 0 - 32 IU/L Final   SGPT (ALT)  Date Value Ref Range Status  03/28/2013 19 12 - 78 U/L Final         Passed - AST in normal range and within 360 days    AST  Date Value Ref Range Status  04/20/2020 31 0 - 40 IU/L Final   SGOT(AST)  Date Value Ref Range Status  03/28/2013 16 15 - 37 Unit/L Final         Passed - PLT in normal range and within 180 days    Platelets  Date Value Ref Range Status  04/20/2020 243 150 - 450 x10E3/uL Final         Passed - Valid encounter within last 6 months    Recent Outpatient Visits          1 month ago Type 2 diabetes mellitus without complication, without long-term current use of insulin ([place])   [facility] Birdie Sons, MD   2 months ago CAFL (chronic airflow limitation) [facility])   [place], [place], PA-C   5 months ago Type 2 diabetes mellitus without complication, without long-term current use of insulin [facility])   [facility] Birdie Sons, MD   8 months ago Acute frontal sinusitis, recurrence not specified   [facility] Birdie Sons, MD   10 months ago Exposure to COVID-19 virus   [employer],  [place] Sorrel, PA-C      Future Appointments            In 3 months Fisher, Kirstie Peri, MD [facility]           . bisoprolol (ZEBETA) 10 MG tablet [Pharmacy Med Name: BISOPROLOL FUMARATE 10 MG TAB] 90 tablet 0    Sig: Take 1 tablet (10 mg total) by mouth daily.     Cardiovascular:  Beta Blockers Passed - 10/03/2020 11:30 AM      Passed - Last BP in normal range    BP Readings from Last 1 Encounters:  09/22/20 136/79         Passed - Last Heart Rate in normal range    Pulse Readings from Last 1 Encounters:  09/22/20 70         Passed - Valid encounter within last 6 months    Recent Outpatient Visits          1 month ago Type 2 diabetes mellitus without complication, without long-term current use of  insulin [facility])   [place], MD   2 months ago CAFL (chronic airflow limitation) [facility])   [place], [place], PA-C   5 months ago Type 2 diabetes mellitus without complication, without long-term current use of insulin [facility])   [facility] Birdie Sons, MD   8 months ago Acute frontal sinusitis, recurrence not specified   [facility] Birdie Sons, MD   10 months ago Exposure to COVID-19 virus   [employer], [place] Sorrel, PA-C      Future Appointments            In 3 months Fisher, Kirstie Peri, MD [facility], [place]

## 2020-10-06 LAB — COLOGUARD: COLOGUARD: POSITIVE — AB

## 2020-10-10 ENCOUNTER — Telehealth: Payer: Self-pay | Admitting: Family Medicine

## 2020-10-10 DIAGNOSIS — R195 Other fecal abnormalities: Secondary | ICD-10-CM | POA: Insufficient documentation

## 2020-10-11 ENCOUNTER — Telehealth: Payer: Self-pay

## 2020-10-11 NOTE — Telephone Encounter (Signed)
Copied from [place] [id number]. Topic: General - Other >> Oct 08, 2020  4:23 PM Erick Blinks wrote: Reason for CRM: [employer], Lattie Haw called to confirm whether office has received the results for the patient's cologuard.   Best contact: [phone]

## 2020-10-13 DIAGNOSIS — R202 Paresthesia of skin: Secondary | ICD-10-CM | POA: Diagnosis not present

## 2020-10-13 DIAGNOSIS — M5030 Other cervical disc degeneration, unspecified cervical region: Secondary | ICD-10-CM | POA: Diagnosis not present

## 2020-10-13 DIAGNOSIS — R4189 Other symptoms and signs involving cognitive functions and awareness: Secondary | ICD-10-CM | POA: Diagnosis not present

## 2020-10-13 DIAGNOSIS — R2689 Other abnormalities of gait and mobility: Secondary | ICD-10-CM | POA: Diagnosis not present

## 2020-10-13 DIAGNOSIS — G5603 Carpal tunnel syndrome, bilateral upper limbs: Secondary | ICD-10-CM | POA: Diagnosis not present

## 2020-10-13 DIAGNOSIS — R200 Anesthesia of skin: Secondary | ICD-10-CM | POA: Diagnosis not present

## 2020-10-13 DIAGNOSIS — G250 Essential tremor: Secondary | ICD-10-CM | POA: Diagnosis not present

## 2020-10-14 ENCOUNTER — Telehealth: Payer: Self-pay

## 2020-10-14 DIAGNOSIS — F17210 Nicotine dependence, cigarettes, uncomplicated: Secondary | ICD-10-CM

## 2020-10-14 MED ORDER — BUPROPION HCL ER (XL) 300 MG PO TB24: 300.0000 mg | ORAL_TABLET | Freq: Every day | ORAL | 1 refills | Status: DC

## 2020-10-14 NOTE — Telephone Encounter (Signed)
Copied from [place] [id number]. Topic: General - Other >> Oct 14, 2020 10:23 AM Leward Quan A wrote: Reason for CRM: Patient called in to inquire of Dr Caryn Section to please send Rx of higher dose of buPROPion (WELLBUTRIN XL) 150 MG 24 hr tablet sent to the pharmacy. Stated that she took what was prescribed and waited for a call but no one reached out to her and she is needing to stop smoking.  Ph# [phone]

## 2020-10-14 NOTE — Telephone Encounter (Signed)
Have sent order for 300mg  buproprion

## 2020-10-14 NOTE — Telephone Encounter (Signed)
Tried calling patient. Left detailed message on cell phone voice message system (ok per DPR).

## 2020-10-15 ENCOUNTER — Ambulatory Visit: Payer: Self-pay | Admitting: *Deleted

## 2020-10-15 NOTE — Telephone Encounter (Signed)
Yes she can take it with Plavix, it won't thin her blood any more. She can take the bupropion at night.

## 2020-10-15 NOTE — Telephone Encounter (Signed)
Patient advised.

## 2020-10-15 NOTE — Telephone Encounter (Signed)
Summary: Clinical Advice   Patient wants to know if she can take plavix and wellbutrin together. Please advise     Patient is calling with concerns/questions:   Patient wants to know if she needs more frequent lab work to make sure the Wellbutrin is not causing her blood to thin too much.  Patient has been taking Wellbutrin 150 mg at night- can she keep taking it at night- or does she need to change it to morning? (She states she gets nauseous on the  300 mg and she has 9 other medications she takes in the morning.)  Reason for Disposition  [1] Caller has NON-URGENT medicine question about med that PCP prescribed AND [2] triager unable to answer question  Answer Assessment - Initial Assessment Questions 1. NAME of MEDICATION: "What medicine are you calling about?"     Wellbutrin and Plavix 2. QUESTION: "What is your question?" (e.g., medication refill, side effect)     Patient is concerned about the interaction warning 3. PRESCRIBING HCP: "Who prescribed it?" Reason: if prescribed by specialist, call should be referred to that group.     PCP 4. SYMPTOMS: "Do you have any symptoms?"     n/a  Protocols used: MEDICATION QUESTION CALL-A-AH

## 2020-10-18 IMAGING — CT CT CHEST LUNG CANCER SCREENING LOW DOSE W/O CM
2 of 5 series · 15 of 40 positions shown, 18 images · non-contrast
Comparison: 01/01/2018 screening chest CT.

CLINICAL DATA: 63-year-old asymptomatic female current smoker with
67 pack-year smoking history.

EXAM:
CT CHEST WITHOUT CONTRAST LOW-DOSE FOR LUNG CANCER SCREENING
TECHNIQUE: Multidetector CT imaging of the chest was performed following the
standard protocol without IV contrast.

[Series 5: lung · axial · 0.65mm/px · z∈[-1269,-991]mm · 12 of 310 slices shown, 15 images (1 of 2)]
[im 16/310  mediastinal]
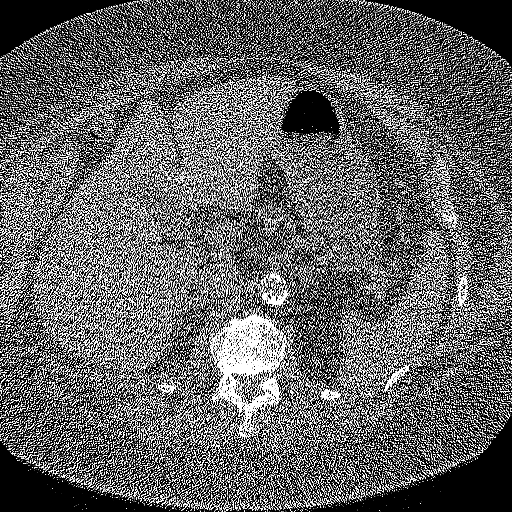
[im 16/310  lung]
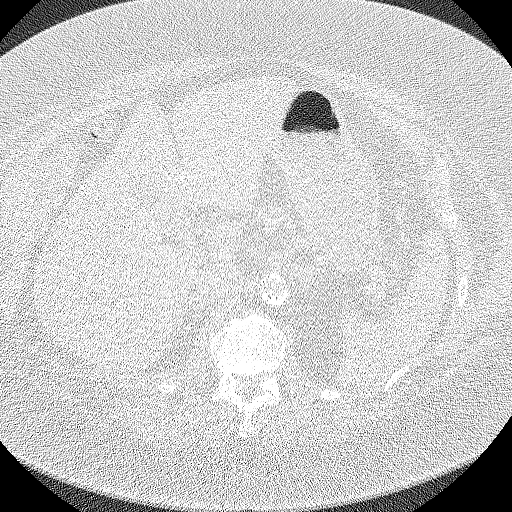
[im 47/310  lung]
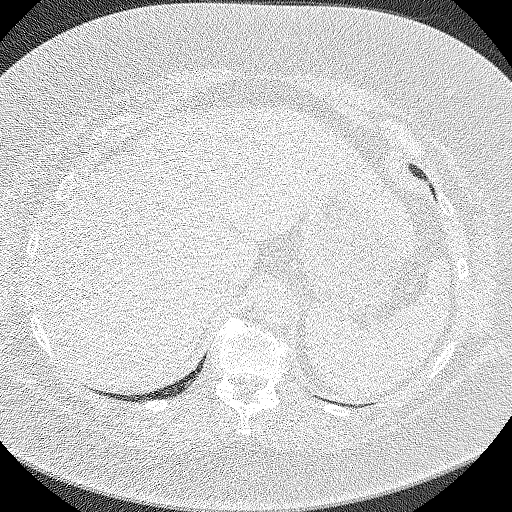
[im 62/310  lung]
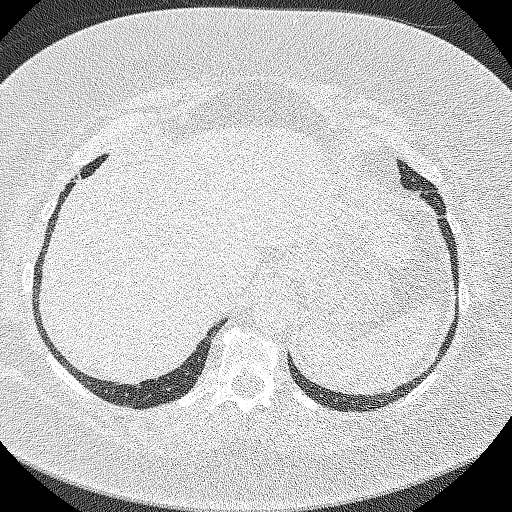
[im 93/310  lung]
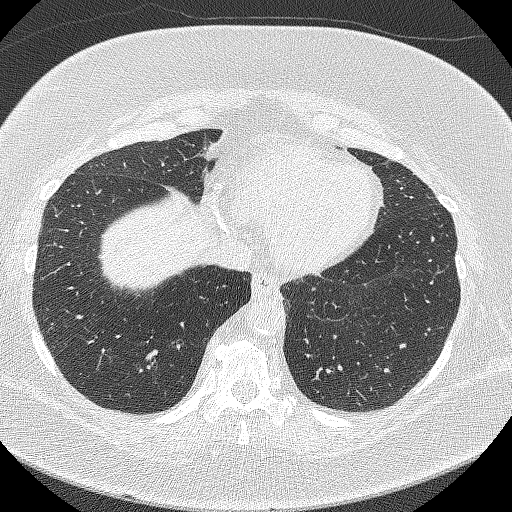
[im 124/310  mediastinal]
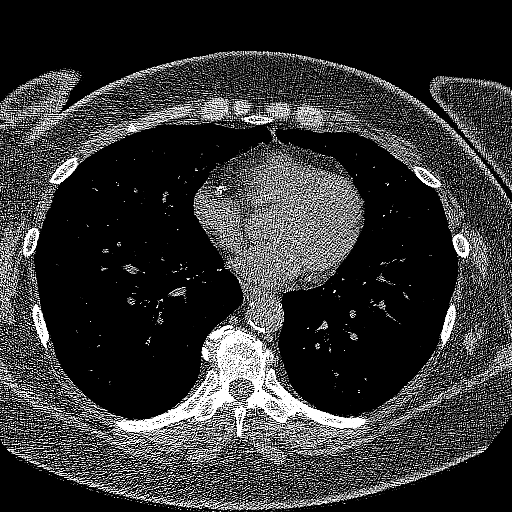
[im 124/310  lung]
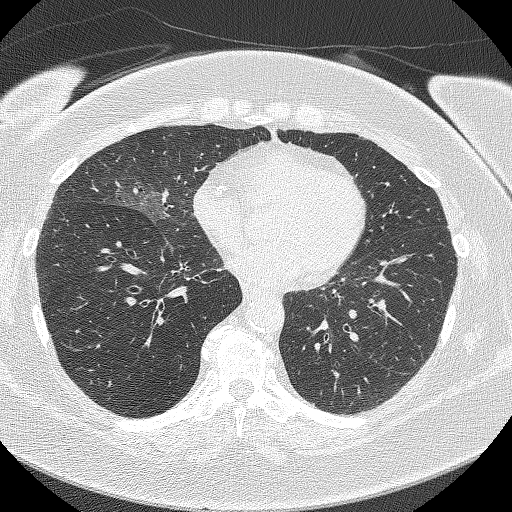
[im 140/310  lung]
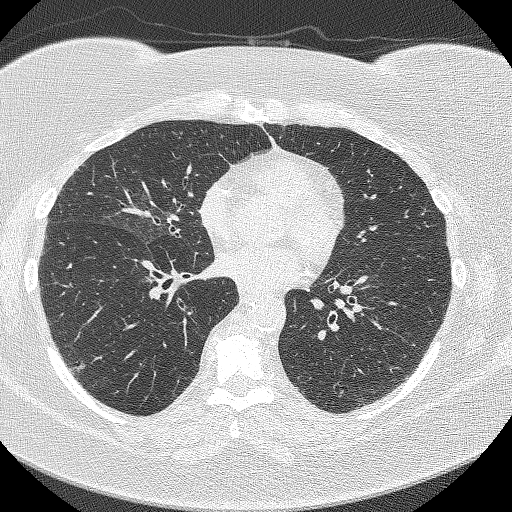
[im 170/310  lung]
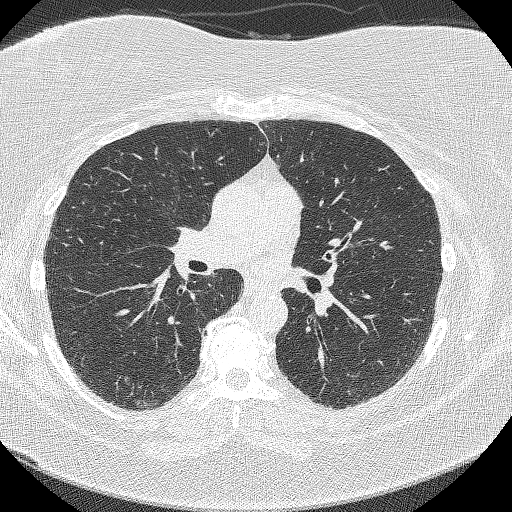
[im 186/310  lung]
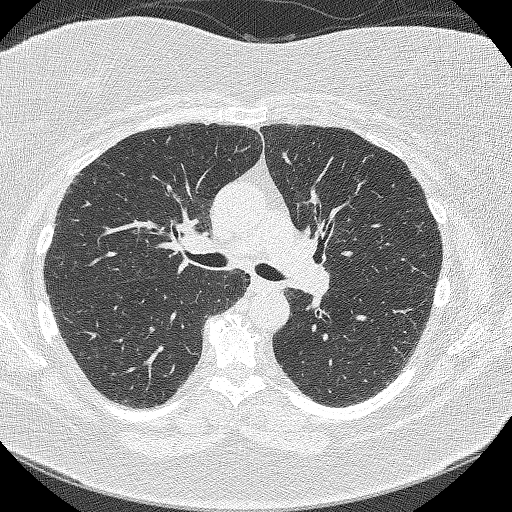
[im 217/310  mediastinal]
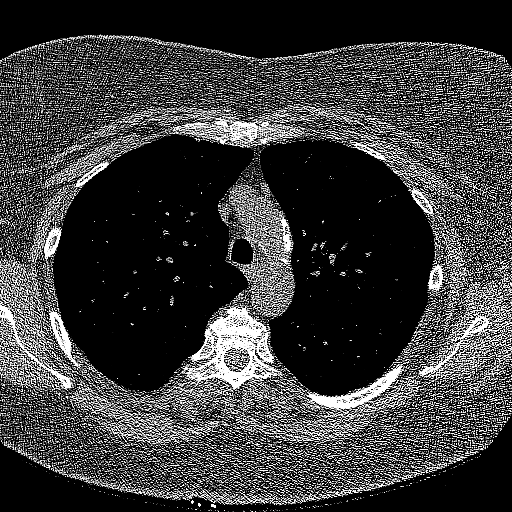
[im 217/310  lung]
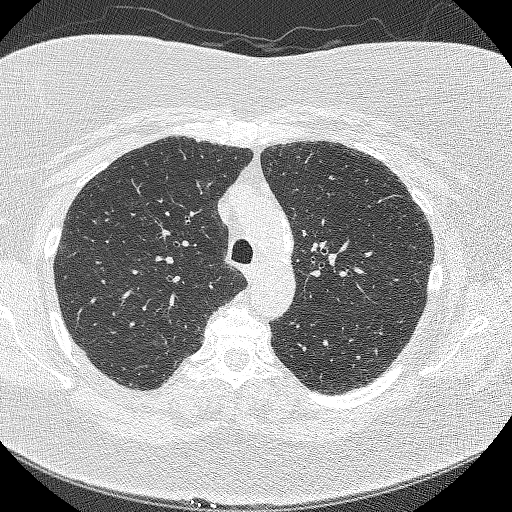
[im 248/310  lung]
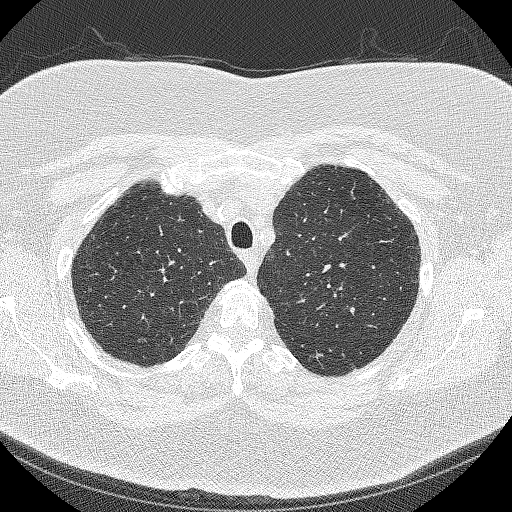
[im 263/310  lung]
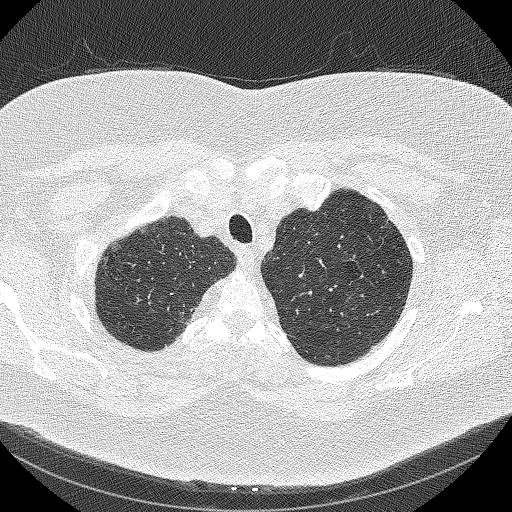
[im 294/310  lung]
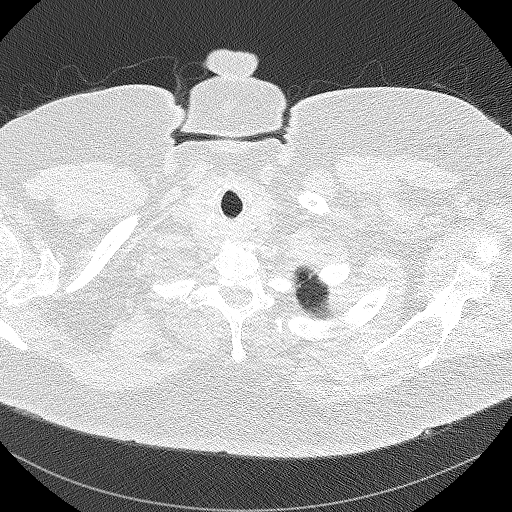

[Series 6: lung · coronal · 0.61mm/px · 3 of 295 slices shown (2 of 2)]
[im 59/295  lung]
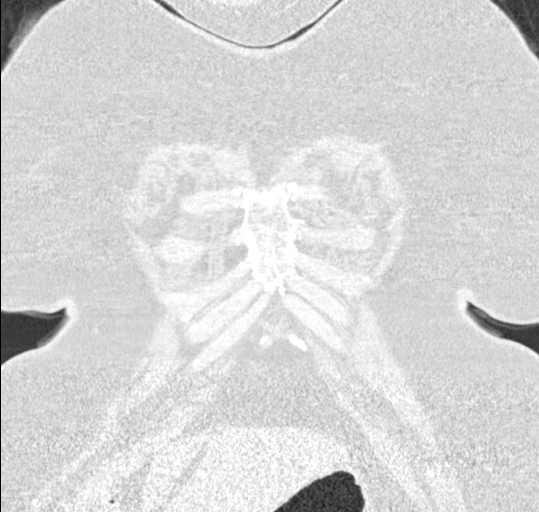
[im 118/295  lung]
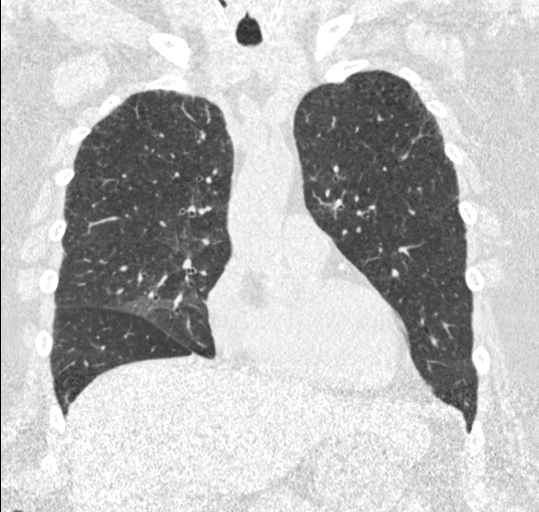
[im 177/295  lung]
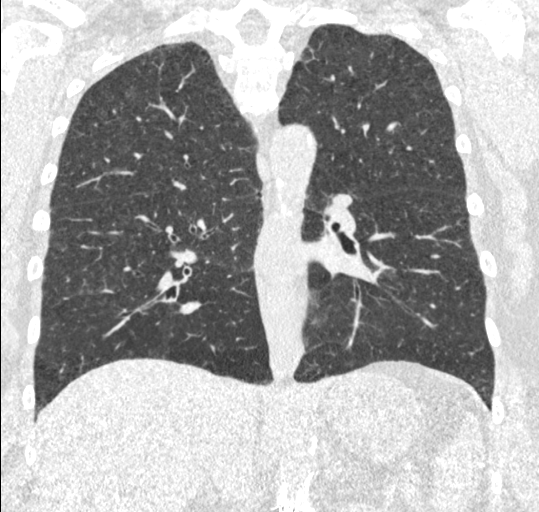

[15 of 40 positions shown; findings below may reference images not displayed]

FINDINGS: Cardiovascular: Normal heart size. No significant pericardial
effusion/thickening. Three-vessel coronary atherosclerosis.
Atherosclerotic nonaneurysmal thoracic aorta. Normal caliber
pulmonary arteries.

Mediastinum/Nodes: No discrete thyroid nodules. Unremarkable
esophagus. No pathologically enlarged axillary, mediastinal or hilar
lymph nodes, noting limited sensitivity for the detection of hilar
adenopathy on this noncontrast study.

Lungs/Pleura: No pneumothorax. No pleural effusion. Moderate
centrilobular and paraseptal emphysema with mild diffuse bronchial
wall thickening. No acute consolidative airspace disease or lung
masses. No significant growth of previously visualized scattered
pulmonary nodules. New solid pulmonary nodule in the posterior left
lower lobe measuring 5.6 mm in volume derived mean diameter (series
5/image 163). No additional new significant pulmonary nodules.

Upper abdomen: No acute abnormality.

Musculoskeletal: No aggressive appearing focal osseous lesions.
Marked thoracic spondylosis.
IMPRESSION: 1. Lung-RADS 3, probably benign findings. Short-term follow-up in 6
months is recommended with repeat low-dose chest CT without contrast
(please use the following order, "CT CHEST LCS NODULE FOLLOW-UP W/O
CM").
2. Three-vessel coronary atherosclerosis.

Aortic Atherosclerosis (4WR8A-QOS.S) and Emphysema (4WR8A-7VT.A).

## 2020-11-02 ENCOUNTER — Other Ambulatory Visit: Payer: Self-pay | Admitting: Family Medicine

## 2020-11-02 DIAGNOSIS — I10 Essential (primary) hypertension: Secondary | ICD-10-CM

## 2020-11-02 NOTE — Telephone Encounter (Signed)
Requested Prescriptions  Pending Prescriptions Disp Refills  . bisoprolol (ZEBETA) 10 MG tablet [Pharmacy Med Name: BISOPROLOL FUMARATE 10 MG TAB] 90 tablet 0    Sig: Take 1 tablet (10 mg total) by mouth daily.     Cardiovascular:  Beta Blockers Passed - 11/02/2020  2:13 PM      Passed - Last BP in normal range    BP Readings from Last 1 Encounters:  09/22/20 136/79         Passed - Last Heart Rate in normal range    Pulse Readings from Last 1 Encounters:  09/22/20 70         Passed - Valid encounter within last 6 months    Recent Outpatient Visits          2 months ago Type 2 diabetes mellitus without complication, without long-term current use of insulin ([place])   [facility] Birdie Sons, MD   3 months ago CAFL (chronic airflow limitation) [facility])   [place], [place], PA-C   6 months ago Type 2 diabetes mellitus without complication, without long-term current use of insulin [facility])   [facility] Birdie Sons, MD   9 months ago Acute frontal sinusitis, recurrence not specified   [facility] Birdie Sons, MD   11 months ago Exposure to COVID-19 virus   [employer], Cle[place] Sorrel, PA-C      Future Appointments            In 2 months Fisher, Kirstie Peri, MD [facility], [place]

## 2020-11-17 ENCOUNTER — Other Ambulatory Visit: Payer: Self-pay | Admitting: Family Medicine

## 2020-11-17 DIAGNOSIS — E7800 Pure hypercholesterolemia, unspecified: Secondary | ICD-10-CM

## 2020-11-17 NOTE — Telephone Encounter (Signed)
Requested medication (s) are due for refill today  Yes  Requested medication (s) are on the active medication list Yes  Future visit scheduled  Yes 01/05/21    LOV 09/03/20  Note to clinic:labs failed-last CMET 04/20/20. Routing to clinic for approval.   Requested Prescriptions  Pending Prescriptions Disp Refills   lisinopril-hydrochlorothiazide (ZESTORETIC) 20-12.5 MG tablet [Pharmacy Med Name: LISINOPRIL-HCTZ 20-12.5 MG TAB] 90 tablet 0    Sig: Take 1 tablet by mouth daily.      Cardiovascular:  ACEI + Diuretic Combos Failed - 11/17/2020  3:50 PM      Failed - Na in normal range and within 180 days    Sodium  Date Value Ref Range Status  04/20/2020 141 134 - 144 mmol/L Final  03/28/2013 139 136 - 145 mmol/L Final          Failed - K in normal range and within 180 days    Potassium  Date Value Ref Range Status  04/20/2020 3.8 3.5 - 5.2 mmol/L Final  03/28/2013 3.7 3.5 - 5.1 mmol/L Final          Failed - Cr in normal range and within 180 days    Creat  Date Value Ref Range Status  08/30/2017 0.60 0.50 - 0.99 mg/dL Final    Comment:    For patients >64 years of age, the reference limit for Creatinine is approximately 13% higher for people identified as African-American. .    Creatinine, Ser  Date Value Ref Range Status  04/20/2020 0.67 0.57 - 1.00 mg/dL Final   Creatinine, POC  Date Value Ref Range Status  12/12/2017 n/a mg/dL Final          Failed - Ca in normal range and within 180 days    Calcium  Date Value Ref Range Status  04/20/2020 9.7 8.7 - 10.3 mg/dL Final   Calcium, Total  Date Value Ref Range Status  03/28/2013 8.2 (L) 8.5 - 10.1 mg/dL Final          Passed - Patient is not pregnant      Passed - Last BP in normal range    BP Readings from Last 1 Encounters:  09/22/20 136/79          Passed - Valid encounter within last 6 months    Recent Outpatient Visits           2 months ago Type 2 diabetes mellitus without complication, without  long-term current use of insulin ([place])   [facility] Birdie Sons, MD   3 months ago CAFL (chronic airflow limitation) [facility])   [place], [place], PA-C   7 months ago Type 2 diabetes mellitus without complication, without long-term current use of insulin [facility])   [facility] Birdie Sons, MD   9 months ago Acute frontal sinusitis, recurrence not specified   [facility] Birdie Sons, MD   11 months ago Exposure to COVID-19 virus   [place], PA-C       Future Appointments             In 1 month Fisher, Kirstie Peri, MD [facility], [facility]               rosuvastatin (CRESTOR) 20 MG tablet [Pharmacy Med Name: ROSUVASTATIN CALCIUM 20 MG TAB] 90 tablet 0    Sig: Take 1 tablet (20 mg total) by mouth daily.  Cardiovascular:  Antilipid - Statins Failed - 11/17/2020  3:50 PM      Failed - LDL in normal range and within 360 days    LDL Chol Calc (NIH)  Date Value Ref Range Status  04/20/2020 66 0 - 99 mg/dL Final          Failed - HDL in normal range and within 360 days    HDL  Date Value Ref Range Status  04/20/2020 36 (L) >39 mg/dL Final          Passed - Total Cholesterol in normal range and within 360 days    Cholesterol, Total  Date Value Ref Range Status  04/20/2020 126 100 - 199 mg/dL Final          Passed - Triglycerides in normal range and within 360 days    Triglycerides  Date Value Ref Range Status  04/20/2020 136 0 - 149 mg/dL Final          Passed - Patient is not pregnant      Passed - Valid encounter within last 12 months    Recent Outpatient Visits           2 months ago Type 2 diabetes mellitus without complication, without long-term current use of insulin ([place])   [facility] Birdie Sons, MD   3 months ago CAFL (chronic airflow limitation) [facility])   [place], [place],  PA-C   7 months ago Type 2 diabetes mellitus without complication, without long-term current use of insulin [facility])   [facility] Birdie Sons, MD   9 months ago Acute frontal sinusitis, recurrence not specified   [facility] Birdie Sons, MD   11 months ago Exposure to COVID-19 virus   [employer], Clearnce [place], PA-C       Future Appointments             In 1 month Fisher, Kirstie Peri, MD [facility], [place]

## 2020-11-29 NOTE — Telephone Encounter (Signed)
GI referral placed

## 2020-11-30 DIAGNOSIS — Z23 Encounter for immunization: Secondary | ICD-10-CM | POA: Diagnosis not present

## 2020-12-07 ENCOUNTER — Ambulatory Visit (INDEPENDENT_AMBULATORY_CARE_PROVIDER_SITE_OTHER): Payer: Medicare Other | Admitting: Gastroenterology

## 2020-12-07 ENCOUNTER — Other Ambulatory Visit: Payer: Self-pay

## 2020-12-07 ENCOUNTER — Encounter: Payer: Self-pay | Admitting: Gastroenterology

## 2020-12-07 ENCOUNTER — Telehealth: Payer: Self-pay

## 2020-12-07 VITALS — BP 135/80 | HR 75 | Temp 97.6°F | Ht 69.0 in | Wt 257.0 lb

## 2020-12-07 DIAGNOSIS — R195 Other fecal abnormalities: Secondary | ICD-10-CM

## 2020-12-07 DIAGNOSIS — K5900 Constipation, unspecified: Secondary | ICD-10-CM

## 2020-12-07 DIAGNOSIS — R1319 Other dysphagia: Secondary | ICD-10-CM | POA: Diagnosis not present

## 2020-12-07 DIAGNOSIS — R42 Dizziness and giddiness: Secondary | ICD-10-CM | POA: Diagnosis not present

## 2020-12-07 DIAGNOSIS — R1310 Dysphagia, unspecified: Secondary | ICD-10-CM

## 2020-12-07 DIAGNOSIS — H903 Sensorineural hearing loss, bilateral: Secondary | ICD-10-CM | POA: Diagnosis not present

## 2020-12-07 NOTE — Telephone Encounter (Deleted)
   [facility] Pre-operative Risk Assessment    Request for surgical clearance:  1. What type of surgery is being performed? ***   2. When is this surgery scheduled? ***   3. Are there any medications that need to be held prior to surgery and how long?***   4. Practice name and name of physician performing surgery? ***   5. What is your office phone and fax number? ***   6. Anesthesia type (None, local, MAC, general) ? ***   Laura Nielsen 12/07/2020, 2:15 PM  _________________________________________________________________   (provider comments below)

## 2020-12-07 NOTE — Progress Notes (Unsigned)
Laura Nielsen [address]  [place]  [place], [place] [address]  Main: [phone]  Fax: [phone]   Gastroenterology Consultation  Referring Provider:     Birdie Sons, MD Primary Care Physician:  Birdie Sons, MD Reason for Consultation:     Constipation, positive Cologuard        HPI:    Chief Complaint  Patient presents with  . Constipation    Laura Nielsen is a 66 y.o. y/o female referred for consultation & management  by Dr. Caryn Section, Kirstie Peri, MD.  Patient reports 2 to 3-year history of constipation.  After 3 days of not having a bowel movement she gives herself an enema.  She states she does not like taking oral laxatives but has only tried magnesium citrate.  No nausea or vomiting.  No weight loss.  No blood in stool.  Prior to this she was having 1 formed bowel movement daily.  No prior colonoscopy.  No family history of colon cancer.  Patient was found to be Cologuard positive in October 2021 and was neg 3 years ago.  Patient also reports 1 year history of intermittent dysphagia to solids, especially hard meats and breads.  No prior upper endoscopy.  No prior episodes of food impaction.  Past Medical History:  Diagnosis Date  . Allergy   . Arthritis   . Asthma   . Depression   . Diabetes mellitus without complication ([place])   . Emphysema of lung ([place])   . GERD (gastroesophageal reflux disease)   . Hyperlipidemia   . Hypertension   . Osteoporosis   . Tobacco abuse counseling     Past Surgical History:  Procedure Laterality Date  . Cardiac catheterization  3/210   70-80% stenosis RCA stent placed. started on Plavix  . [place]  . TUBAL LIGATION    . VAGINAL HYSTERECTOMY     Menometrorrhagia. Excessive bleeding. Unknown if cervix removed.   . vascular stent  03/28/2011   Dr. Delana Meyer, [facility]; Infrarenal    Prior to Admission medications   Medication Sig Start Date End Date Taking? Authorizing Provider  albuterol  (PROVENTIL HFA) 108 (90 Base) MCG/ACT inhaler Inhale 2 puffs into the lungs every 6 (six) hours as needed for wheezing or shortness of breath. 04/12/17  Yes Birdie Sons, MD  allopurinol (ZYLOPRIM) 100 MG tablet Take 1 tablet (100 mg total) by mouth daily. 08/04/19  Yes Birdie Sons, MD  aspirin 81 MG tablet Take 81 mg by mouth daily.  03/25/09  Yes [provider]  bisoprolol (ZEBETA) 10 MG tablet Take 1 tablet (10 mg total) by mouth daily. 11/02/20  Yes Birdie Sons, MD  Cholecalciferol 25 MCG (1000 UT) capsule Take by mouth.   Yes [provider]  clopidogrel (PLAVIX) 75 MG tablet Take 1 tablet (75 mg total) by mouth daily. 10/03/20  Yes Birdie Sons, MD  cyanocobalamin 1000 MCG tablet Take 1,000 mcg by mouth daily.   Yes [provider]  gabapentin (NEURONTIN) 300 MG capsule Take 1 capsule (300 mg total) by mouth 2 (two) times daily AND 2 capsules (600 mg total) at bedtime. Take 1 capsule (300 mg total) by mouth 3 (three) times daily.. Patient taking differently: Take 1 capsule (300 mg total) by mouth 2 (two) times daily AND 2 capsules (600 mg total) at bedtime. 03/10/20 09/22/20 Yes Milinda Pointer, MD  Ibuprofen 200 MG CAPS Take by mouth.    Yes [provider]  lisinopril-hydrochlorothiazide (ZESTORETIC) 20-12.5 MG tablet Take 1 tablet by mouth daily. 11/18/20  Yes Birdie Sons, MD  rosuvastatin (CRESTOR) 20 MG tablet Take 1 tablet (20 mg total) by mouth daily. 11/18/20  Yes Birdie Sons, MD  Semaglutide, 1 MG/DOSE, (OZEMPIC, 1 MG/DOSE,) 2 MG/1.5ML SOPN Inject 1 mg into the skin once a week. 04/30/20  Yes Birdie Sons, MD  loratadine (CLARITIN) 10 MG tablet Take 10 mg by mouth daily as needed.  Patient not taking: Reported on 12/07/2020    [provider]  montelukast (SINGULAIR) 10 MG tablet Take 1 tablet (10 mg total) by mouth at bedtime. Patient not taking: No sig reported 04/20/20   Birdie Sons, MD  oxyCODONE  (ROXICODONE) 5 MG immediate release tablet Take 1 tablet (5 mg total) by mouth daily as needed for severe pain. Patient not taking: Reported on 12/07/2020 08/19/20 09/18/20  Milinda Pointer, MD    Family History  Problem Relation Age of Onset  . Hypertension Mother   . Coronary artery disease Mother   . Heart attack Mother        acute  . Cancer Mother   . Alcohol abuse Father   . Depression Father   . Hypertension Father   . Heart attack Father 52       acute  . Alcohol abuse Sister   . Hyperlipidemia Sister   . Hypertension Sister   . Cancer Sister 58  . Heart attack Sister        x's 2  . Coronary artery disease Sister 33       x's 2  . Breast cancer Sister 65     Social History   Tobacco Use  . Smoking status: Current Every Day Smoker    Packs/day: 1.50    Years: 44.50    Pack years: 66.75    Types: Cigarettes  . Smokeless tobacco: Never Used  . Tobacco comment: 12/23/19 states she quit for 3 months and then started back. Pt is going to talk with MD about this.  Vaping Use  . Vaping Use: Never used  Substance Use Topics  . Alcohol use: Yes    Comment: 1 drink every 3-4 months  . Drug use: No    Allergies as of 12/07/2020 - Review Complete 12/07/2020  Allergen Reaction Noted  . Atorvastatin Other (See Comments) 09/25/2016  . Omeprazole Nausea And Vomiting 07/28/2015  . Bupropion Nausea Only 02/12/2019    Review of Systems:    All systems reviewed and negative except where noted in HPI.   Physical Exam:  BP 135/80   Pulse 75   Temp 97.6 F (36.4 C) (Oral)   Ht 5\' 9"  (1.753 m)   Wt 257 lb (116.6 kg)   BMI 37.95 kg/m  No LMP recorded. Patient has had a hysterectomy. Psych:  Alert and cooperative. Normal mood and affect. General:   Alert,  Well-developed, well-nourished, pleasant and cooperative in NAD Head:  Normocephalic and atraumatic. Eyes:  Sclera clear, no icterus.   Conjunctiva pink. Ears:  Normal auditory acuity. Nose:  No deformity,  discharge, or lesions. Mouth:  No deformity or lesions,oropharynx pink & moist. Neck:  Supple; no masses or thyromegaly. Abdomen:  Normal bowel sounds.  No bruits.  Soft, non-tender and non-distended without masses, hepatosplenomegaly or hernias noted.  No guarding or rebound tenderness.    Msk:  Symmetrical without gross deformities. Good, equal movement & strength bilaterally. Pulses:  Normal pulses noted. Extremities:  No clubbing or edema.  No cyanosis. Neurologic:  Alert and oriented x3;  grossly normal neurologically. Skin:  Intact without significant lesions or rashes. No jaundice. Lymph Nodes:  No significant cervical adenopathy. Psych:  Alert and cooperative. Normal mood and affect.   Labs: CBC    Component Value Date/Time   WBC 10.0 04/20/2020 1141   WBC 9.6 08/30/2017 0920   RBC 5.70 (H) 04/20/2020 1141   RBC 5.15 (H) 08/30/2017 0920   HGB 16.5 (H) 04/20/2020 1141   HCT 49.3 (H) 04/20/2020 1141   PLT 243 04/20/2020 1141   MCV 87 04/20/2020 1141   MCV 88 03/28/2013 0354   MCH 28.9 04/20/2020 1141   MCH 29.3 08/30/2017 0920   MCHC 33.5 04/20/2020 1141   MCHC 34.4 08/30/2017 0920   RDW 13.8 04/20/2020 1141   RDW 13.9 03/28/2013 0354   LYMPHSABS 4.3 (H) 02/16/2016 0941   LYMPHSABS 2.0 03/28/2013 0354   MONOABS 0.7 03/28/2013 0354   EOSABS 0.3 02/16/2016 0941   EOSABS 0.2 03/28/2013 0354   BASOSABS 0.0 02/16/2016 0941   BASOSABS 0.0 03/28/2013 0354   CMP     Component Value Date/Time   NA 141 04/20/2020 1141   NA 139 03/28/2013 0354   K 3.8 04/20/2020 1141   K 3.7 03/28/2013 1811   CL 102 04/20/2020 1141   CL 107 03/28/2013 0354   CO2 20 04/20/2020 1141   CO2 27 03/28/2013 0354   GLUCOSE 91 04/20/2020 1141   GLUCOSE 117 (H) 08/30/2017 0920   GLUCOSE 112 (H) 03/28/2013 0354   BUN 11 04/20/2020 1141   BUN 8 03/28/2013 0354   CREATININE 0.67 04/20/2020 1141   CREATININE 0.60 08/30/2017 0920   CALCIUM 9.7 04/20/2020 1141   CALCIUM 8.2 (L) 03/28/2013 0354    PROT 6.9 04/20/2020 1141   PROT 6.0 (L) 03/28/2013 0354   ALBUMIN 4.3 04/20/2020 1141   ALBUMIN 3.1 (L) 03/28/2013 0354   AST 31 04/20/2020 1141   AST 16 03/28/2013 0354   ALT 29 04/20/2020 1141   ALT 19 03/28/2013 0354   ALKPHOS 106 04/20/2020 1141   ALKPHOS 77 03/28/2013 0354   BILITOT 0.5 04/20/2020 1141   BILITOT 0.6 03/28/2013 0354   GFRNONAA 94 04/20/2020 1141   GFRNONAA 99 08/30/2017 0920   GFRAA 108 04/20/2020 1141   GFRAA 115 08/30/2017 0920    Imaging Studies: No results found.  Assessment and Plan:   Akua Blethen is a 65 y.o. y/o female has been referred for constipation, positive Cologuard  Colonoscopy indicated for positive Cologuard and to rule out malignancy  High-fiber diet encouraged  2-day prep for colonoscopy given her constipation  Once colonoscopy is done, can start medications for constipation, likely MiraLAX initially  EGD also indicated for dysphagia  I have discussed alternative options, risks & benefits,  which include, but are not limited to, bleeding, infection, perforation,respiratory complication & drug reaction.  The patient agrees with this plan & written consent will be obtained.     Dr Laura Nielsen  Speech recognition software was used to dictate the above note.

## 2020-12-07 NOTE — Telephone Encounter (Signed)
Cardiac clearance and blood thinner clearance was sent to Dr. Bethanne Ginger office. Awaiting on response.   Remind patient that she will need to have a covid-19 test on 01/03/2021.

## 2020-12-07 NOTE — Telephone Encounter (Deleted)
   [facility] Pre-operative Risk Assessment    Request for surgical clearance:  1. What type of surgery is being performed? ***   2. When is this surgery scheduled? ***   3. Are there any medications that need to be held prior to surgery and how long?***   4. Practice name and name of physician performing surgery? ***   5. What is your office phone and fax number? ***   6. Anesthesia type (None, local, MAC, general) ? ***   Laura Nielsen 12/07/2020, 2:19 PM  _________________________________________________________________   (provider comments below)

## 2020-12-08 NOTE — Telephone Encounter (Signed)
Called patient to let her know that she is able to hold her Plavix 7 days prior to procedure and restart ASAP per Dr. Nehemiah Massed. I also told patient that she is needing to [facility] from 8-12 PM for her COVID-19 test. Patient agreed and understood everything.

## 2020-12-10 ENCOUNTER — Other Ambulatory Visit: Payer: Self-pay | Admitting: *Deleted

## 2020-12-10 DIAGNOSIS — Z87891 Personal history of nicotine dependence: Secondary | ICD-10-CM

## 2020-12-10 DIAGNOSIS — R918 Other nonspecific abnormal finding of lung field: Secondary | ICD-10-CM

## 2020-12-10 NOTE — Progress Notes (Signed)
Contacted and scheduled for LCS nodule follow up scan. Patient is a current smoker with a 69 pack year history.

## 2020-12-30 ENCOUNTER — Telehealth: Payer: Self-pay

## 2020-12-30 ENCOUNTER — Other Ambulatory Visit: Payer: Self-pay | Admitting: Family Medicine

## 2020-12-30 DIAGNOSIS — Z1231 Encounter for screening mammogram for malignant neoplasm of breast: Secondary | ICD-10-CM

## 2020-12-30 NOTE — Telephone Encounter (Signed)
Patient has some questions around her low fat diet

## 2020-12-30 NOTE — Telephone Encounter (Signed)
Called patient and she stated that she already had answers. Patient had no further questions.

## 2021-01-03 ENCOUNTER — Other Ambulatory Visit
Admission: RE | Admit: 2021-01-03 | Discharge: 2021-01-03 | Disposition: A | Discharge status: Home / Self Care | Payer: Medicare Other | Source: Ambulatory Visit | Attending: Gastroenterology | Admitting: Gastroenterology

## 2021-01-03 ENCOUNTER — Other Ambulatory Visit: Payer: Self-pay

## 2021-01-03 DIAGNOSIS — Z20822 Contact with and (suspected) exposure to covid-19: Secondary | ICD-10-CM | POA: Insufficient documentation

## 2021-01-03 DIAGNOSIS — Z01812 Encounter for preprocedural laboratory examination: Secondary | ICD-10-CM | POA: Diagnosis not present

## 2021-01-03 LAB — SARS CORONAVIRUS 2 (TAT 6-24 HRS): SARS Coronavirus 2: NEGATIVE

## 2021-01-05 ENCOUNTER — Ambulatory Visit: Payer: Self-pay | Admitting: Family Medicine

## 2021-01-05 ENCOUNTER — Encounter
Admission: RE | Disposition: A | Discharge status: Home / Self Care | Payer: Self-pay | Source: Home / Self Care | Attending: Gastroenterology

## 2021-01-05 ENCOUNTER — Ambulatory Visit: Payer: Medicare Other | Admitting: Anesthesiology

## 2021-01-05 ENCOUNTER — Other Ambulatory Visit: Payer: Self-pay

## 2021-01-05 ENCOUNTER — Encounter: Payer: Self-pay | Admitting: Gastroenterology

## 2021-01-05 ENCOUNTER — Ambulatory Visit
Admission: RE | Admit: 2021-01-05 | Discharge: 2021-01-05 | Disposition: A | Discharge status: Home / Self Care | Payer: Medicare Other | Attending: Gastroenterology | Admitting: Gastroenterology

## 2021-01-05 DIAGNOSIS — K621 Rectal polyp: Secondary | ICD-10-CM | POA: Insufficient documentation

## 2021-01-05 DIAGNOSIS — B9681 Helicobacter pylori [H. pylori] as the cause of diseases classified elsewhere: Secondary | ICD-10-CM | POA: Insufficient documentation

## 2021-01-05 DIAGNOSIS — R1310 Dysphagia, unspecified: Secondary | ICD-10-CM | POA: Insufficient documentation

## 2021-01-05 DIAGNOSIS — Z7982 Long term (current) use of aspirin: Secondary | ICD-10-CM | POA: Insufficient documentation

## 2021-01-05 DIAGNOSIS — K319 Disease of stomach and duodenum, unspecified: Secondary | ICD-10-CM | POA: Diagnosis not present

## 2021-01-05 DIAGNOSIS — Z860101 Personal history of adenomatous and serrated colon polyps: Secondary | ICD-10-CM | POA: Insufficient documentation

## 2021-01-05 DIAGNOSIS — I2510 Atherosclerotic heart disease of native coronary artery without angina pectoris: Secondary | ICD-10-CM | POA: Diagnosis not present

## 2021-01-05 DIAGNOSIS — K2100 Gastro-esophageal reflux disease with esophagitis, without bleeding: Secondary | ICD-10-CM | POA: Insufficient documentation

## 2021-01-05 DIAGNOSIS — K219 Gastro-esophageal reflux disease without esophagitis: Secondary | ICD-10-CM | POA: Diagnosis not present

## 2021-01-05 DIAGNOSIS — K5790 Diverticulosis of intestine, part unspecified, without perforation or abscess without bleeding: Secondary | ICD-10-CM | POA: Diagnosis not present

## 2021-01-05 DIAGNOSIS — K648 Other hemorrhoids: Secondary | ICD-10-CM | POA: Diagnosis not present

## 2021-01-05 DIAGNOSIS — K2970 Gastritis, unspecified, without bleeding: Secondary | ICD-10-CM | POA: Insufficient documentation

## 2021-01-05 DIAGNOSIS — Z79899 Other long term (current) drug therapy: Secondary | ICD-10-CM | POA: Insufficient documentation

## 2021-01-05 DIAGNOSIS — F17210 Nicotine dependence, cigarettes, uncomplicated: Secondary | ICD-10-CM | POA: Diagnosis not present

## 2021-01-05 DIAGNOSIS — K2270 Barrett's esophagus without dysplasia: Secondary | ICD-10-CM | POA: Diagnosis not present

## 2021-01-05 DIAGNOSIS — K3189 Other diseases of stomach and duodenum: Secondary | ICD-10-CM | POA: Diagnosis not present

## 2021-01-05 DIAGNOSIS — J439 Emphysema, unspecified: Secondary | ICD-10-CM | POA: Diagnosis not present

## 2021-01-05 DIAGNOSIS — Z955 Presence of coronary angioplasty implant and graft: Secondary | ICD-10-CM | POA: Insufficient documentation

## 2021-01-05 DIAGNOSIS — R195 Other fecal abnormalities: Secondary | ICD-10-CM | POA: Diagnosis not present

## 2021-01-05 DIAGNOSIS — L538 Other specified erythematous conditions: Secondary | ICD-10-CM | POA: Diagnosis not present

## 2021-01-05 DIAGNOSIS — K635 Polyp of colon: Secondary | ICD-10-CM | POA: Diagnosis not present

## 2021-01-05 DIAGNOSIS — K2289 Other specified disease of esophagus: Secondary | ICD-10-CM | POA: Diagnosis not present

## 2021-01-05 DIAGNOSIS — Z7902 Long term (current) use of antithrombotics/antiplatelets: Secondary | ICD-10-CM | POA: Insufficient documentation

## 2021-01-05 DIAGNOSIS — K5730 Diverticulosis of large intestine without perforation or abscess without bleeding: Secondary | ICD-10-CM | POA: Diagnosis not present

## 2021-01-05 DIAGNOSIS — K649 Unspecified hemorrhoids: Secondary | ICD-10-CM | POA: Diagnosis not present

## 2021-01-05 DIAGNOSIS — I10 Essential (primary) hypertension: Secondary | ICD-10-CM | POA: Diagnosis not present

## 2021-01-05 HISTORY — PX: COLONOSCOPY WITH PROPOFOL: SHX5780

## 2021-01-05 HISTORY — DX: Sleep apnea, unspecified: G47.30

## 2021-01-05 HISTORY — PX: ESOPHAGOGASTRODUODENOSCOPY (EGD) WITH PROPOFOL: SHX5813

## 2021-01-05 LAB — GLUCOSE, CAPILLARY: Glucose-Capillary: 91 mg/dL (ref 70–99)

## 2021-01-05 SURGERY — COLONOSCOPY WITH PROPOFOL
Anesthesia: General

## 2021-01-05 MED ORDER — PROPOFOL 10 MG/ML IV BOLUS
INTRAVENOUS | Status: AC
  Filled 2021-01-05: qty 20

## 2021-01-05 MED ORDER — LIDOCAINE HCL (CARDIAC) PF 100 MG/5ML IV SOSY
PREFILLED_SYRINGE | INTRAVENOUS | Status: DC | PRN
  Administered 2021-01-05: 50 mg via INTRAVENOUS

## 2021-01-05 MED ORDER — PROPOFOL 500 MG/50ML IV EMUL
INTRAVENOUS | Status: AC
  Filled 2021-01-05: qty 50

## 2021-01-05 MED ORDER — SODIUM CHLORIDE 0.9 % IV SOLN: INTRAVENOUS | Status: DC

## 2021-01-05 MED ORDER — PROPOFOL 500 MG/50ML IV EMUL
INTRAVENOUS | Status: DC | PRN
  Administered 2021-01-05: 125 ug/kg/min via INTRAVENOUS

## 2021-01-05 MED ORDER — PROPOFOL 10 MG/ML IV BOLUS
INTRAVENOUS | Status: DC | PRN
  Administered 2021-01-05: 50 mg via INTRAVENOUS

## 2021-01-05 NOTE — Anesthesia Procedure Notes (Signed)
Procedure Name: MAC Date/Time: 01/05/2021 8:35 AM Performed by: Jerrye Noble, CRNA Pre-anesthesia Checklist: Patient identified, Emergency Drugs available, Suction available and Patient being monitored Patient Re-evaluated:Patient Re-evaluated prior to induction Oxygen Delivery Method: Nasal cannula

## 2021-01-05 NOTE — Anesthesia Preprocedure Evaluation (Signed)
Anesthesia Evaluation  Patient identified by MRN, date of birth, ID band Patient awake    Reviewed: Allergy & Precautions, H&P , NPO status , Patient's Chart, lab work & pertinent test results  Airway Mallampati: II       Dental  (+) Missing, Loose, Poor Dentition   Pulmonary asthma , sleep apnea , COPD, Current Smoker and Patient abstained from smoking.,    Pulmonary exam normal breath sounds clear to auscultation       Cardiovascular hypertension, + CAD and + Peripheral Vascular Disease  Normal cardiovascular exam Rhythm:Regular Rate:Normal     Neuro/Psych  Headaches, PSYCHIATRIC DISORDERS Depression  Neuromuscular disease    GI/Hepatic Neg liver ROS, GERD  ,  Endo/Other  negative endocrine ROSdiabetes  Renal/GU negative Renal ROS  negative genitourinary   Musculoskeletal  (+) Arthritis ,   Abdominal   Peds negative pediatric ROS (+)  Hematology negative hematology ROS (+)   Anesthesia Other Findings Past Medical History: No date: Allergy No date: Arthritis No date: Asthma No date: Depression No date: Diabetes mellitus without complication (HCC) No date: Emphysema of lung (HCC) No date: GERD (gastroesophageal reflux disease) No date: Hyperlipidemia No date: Hypertension No date: Osteoporosis No date: Tobacco abuse counseling   Reproductive/Obstetrics negative OB ROS                             Anesthesia Physical Anesthesia Plan  ASA: III  Anesthesia Plan: General   Post-op Pain Management:    Induction: Intravenous  PONV Risk Score and Plan: 2 and Propofol infusion  Airway Management Planned: Natural Airway and Nasal Cannula  Additional Equipment:   Intra-op Plan:   Post-operative Plan:   Informed Consent: I have reviewed the patients History and Physical, chart, labs and discussed the procedure including the risks, benefits and alternatives for the proposed  anesthesia with the patient or authorized representative who has indicated his/her understanding and acceptance.     Dental advisory given  Plan Discussed with: CRNA, Anesthesiologist and Surgeon  Anesthesia Plan Comments:         Anesthesia Quick Evaluation

## 2021-01-05 NOTE — Transfer of Care (Signed)
Immediate Anesthesia Transfer of Care Note  Patient: Laura Nielsen  Procedure(s) Performed: COLONOSCOPY WITH PROPOFOL (N/A ) ESOPHAGOGASTRODUODENOSCOPY (EGD) WITH PROPOFOL (N/A )  Patient Location: PACU and Endoscopy Unit  Anesthesia Type:General  Level of Consciousness: drowsy  Airway & Oxygen Therapy: Patient Spontanous Breathing  Post-op Assessment: Report given to RN and Post -op Vital signs reviewed and stable  Post vital signs: Reviewed and stable  Last Vitals:  Vitals Value Taken Time  BP 95/59 01/05/21 0945  Temp    Pulse 62 01/05/21 0946  Resp 25 01/05/21 0946  SpO2 95 % 01/05/21 0946  Vitals shown include unvalidated device data.  Last Pain:  Vitals:   01/05/21 0808  TempSrc: Temporal  PainSc: 0-No pain         Complications: No complications documented.

## 2021-01-05 NOTE — Progress Notes (Signed)
Patient tongue got pinched during the procedure, left side. Suggested salt water rinses

## 2021-01-05 NOTE — Op Note (Signed)
[facility] Gastroenterology Patient Name: Laura Nielsen Procedure Date: 01/05/2021 8:27 AM MRN: [id number] Account #: [id number] Date of Birth: 03-24-1956 Admit Type: Outpatient Age: 65 Room: [facility] ENDO ROOM 3 Gender: Female Note Status: Finalized Procedure:             Upper GI endoscopy Indications:           Dysphagia Providers:             Lucah Petta B. Bonna Gains MD, MD Referring MD:          Kirstie Peri. Caryn Section, MD (Referring MD) Medicines:             Monitored Anesthesia Care Complications:         No immediate complications. Procedure:             Pre-Anesthesia Assessment:                        - Prior to the procedure, a History and Physical was                         performed, and patient medications, allergies and                         sensitivities were reviewed. The patient's tolerance                         of previous anesthesia was reviewed.                        - The risks and benefits of the procedure and the                         sedation options and risks were discussed with the                         patient. All questions were answered and informed                         consent was obtained.                        - Patient identification and proposed procedure were                         verified prior to the procedure by the physician, the                         nurse, the anesthesiologist, the anesthetist and the                         technician. The procedure was verified in the                         procedure room.                        - ASA Grade Assessment: II - A patient with mild                         systemic disease.  After obtaining informed consent, the endoscope was                         passed under direct vision. Throughout the procedure,                         the patient's blood pressure, pulse, and oxygen                         saturations were monitored continuously. The Endoscope                          was introduced through the mouth, and advanced to the                         second part of duodenum. The upper GI endoscopy was                         accomplished with ease. The patient tolerated the                         procedure well. Findings:      One tongue of salmon-colored mucosa was present. No other visible       abnormalities were present. Mucosa was biopsied with a cold forceps for       histology.      There is no endoscopic evidence of stenosis or stricture in the entire       esophagus. Biopsies were obtained from the proximal and distal esophagus       with cold forceps for histology of suspected eosinophilic esophagitis.      Patchy mildly erythematous mucosa without bleeding was found in the       gastric body. Biopsies were taken with a cold forceps for histology.      The exam of the stomach was otherwise normal.      The duodenal bulb was normal.      Mucosal flattening was found in the second portion of the duodenum.       Biopsies were taken with a cold forceps for histology.      The exam of the duodenum was otherwise normal. Impression:            - Salmon-colored mucosa suspicious for short-segment                         Barrett's esophagus. Biopsied.                        - Erythematous mucosa in the gastric body. Biopsied.                        - Normal duodenal bulb.                        - Flattened mucosa was found in the duodenum. Biopsied. Recommendation:        - Await pathology results.                        - Discharge patient to home (with escort).                        -  Advance diet as tolerated.                        - Continue present medications.                        - Patient has a contact number available for                         emergencies. The signs and symptoms of potential                         delayed complications were discussed with the patient.                         Return to normal  activities tomorrow. Written                         discharge instructions were provided to the patient.                        - Discharge patient to home (with escort).                        - The findings and recommendations were discussed with                         the patient.                        - The findings and recommendations were discussed with                         the patient's family.                        - Follow an antireflux regimen. Procedure Code(s):     --- Professional ---                        [phone], Esophagogastroduodenoscopy, flexible,                         transoral; with biopsy, single or multiple Diagnosis Code(s):     --- Professional ---                        K22.8, Other specified diseases of esophagus                        R13.10, Dysphagia, unspecified                        K31.89, Other diseases of stomach and duodenum CPT copyright 2019 American Medical Association. All rights reserved. The codes documented in this report are preliminary and upon coder review may  be revised to meet current compliance requirements.  Vonda Antigua, MD Margretta Sidle B. Bonna Gains MD, MD 01/05/2021 8:55:21 AM This report has been signed electronically. Number of Addenda: 0 Note Initiated On: 01/05/2021 8:27 AM Estimated Blood Loss:  Estimated blood loss: none.      [facility]

## 2021-01-05 NOTE — Anesthesia Postprocedure Evaluation (Signed)
Anesthesia Post Note  Patient: Laura Nielsen  Procedure(s) Performed: COLONOSCOPY WITH PROPOFOL (N/A ) ESOPHAGOGASTRODUODENOSCOPY (EGD) WITH PROPOFOL (N/A )  Patient location during evaluation: Endoscopy Anesthesia Type: General Level of consciousness: awake Pain management: pain level controlled Vital Signs Assessment: post-procedure vital signs reviewed and stable Respiratory status: spontaneous breathing Cardiovascular status: stable Postop Assessment: no apparent nausea or vomiting Anesthetic complications: no   No complications documented.   Last Vitals:  Vitals:   01/05/21 0944 01/05/21 0955  BP: (!) 95/59 115/78  Pulse: 63 62  Resp: (!) 24 17  Temp: (!) 35.8 C   SpO2: 94% 97%    Last Pain:  Vitals:   01/05/21 0944  TempSrc: Temporal  PainSc: Asleep                 Neva Seat

## 2021-01-05 NOTE — Op Note (Signed)
[facility] Gastroenterology Patient Name: Laura Nielsen Procedure Date: 01/05/2021 8:25 AM MRN: [id number] Account #: [id number] Date of Birth: [birth year] Admit Type: Outpatient Age: 65 Room: [facility] ENDO ROOM 3 Gender: Female Note Status: Finalized Procedure:             Colonoscopy Indications:           Positive Cologuard test Providers:             Gale Hulse B. Bonna Gains MD, MD Referring MD:          Kirstie Peri. Caryn Section, MD (Referring MD) Medicines:             Monitored Anesthesia Care Complications:         No immediate complications. Procedure:             Pre-Anesthesia Assessment:                        - ASA Grade Assessment: II - A patient with mild                         systemic disease.                        - Prior to the procedure, a History and Physical was                         performed, and patient medications, allergies and                         sensitivities were reviewed. The patient's tolerance                         of previous anesthesia was reviewed.                        - The risks and benefits of the procedure and the                         sedation options and risks were discussed with the                         patient. All questions were answered and informed                         consent was obtained.                        - Patient identification and proposed procedure were                         verified prior to the procedure by the physician, the                         nurse, the anesthesiologist, the anesthetist and the                         technician. The procedure was verified in the                         procedure room.  After obtaining informed consent, the colonoscope was                         passed under direct vision. Throughout the procedure,                         the patient's blood pressure, pulse, and oxygen                         saturations were monitored continuously. The                          Colonoscope was introduced through the anus and                         advanced to the the cecum, identified by appendiceal                         orifice and ileocecal valve. The colonoscopy was                         performed with ease. The patient tolerated the                         procedure well. The quality of the bowel preparation                         was fair. Findings:      The perianal and digital rectal examinations were normal.      A 8 mm polyp was found in the hepatic flexure. The polyp was flat. The       polyp was removed with a cold snare. Resection and retrieval were       complete.      Two flat and sessile polyps were found in the descending colon. The       polyps were 5 to 7 mm in size. These polyps were removed with a cold       snare. Resection and retrieval were complete.      Two sessile polyps were found in the rectum and sigmoid colon. The       polyps were 4 to 6 mm in size. These polyps were removed with a cold       snare. Resection and retrieval were complete.      A few diverticula were found in the sigmoid colon.      The exam was otherwise without abnormality.      The rectum, sigmoid colon, descending colon, transverse colon, ascending       colon and cecum appeared normal.      Non-bleeding internal hemorrhoids were found during retroflexion. Impression:            - Preparation of the colon was fair.                        - One 8 mm polyp at the hepatic flexure, removed with                         a cold snare. Resected and retrieved.                        -  Two 5 to 7 mm polyps in the descending colon,                         removed with a cold snare. Resected and retrieved.                        - Two 4 to 6 mm polyps in the rectum and in the                         sigmoid colon, removed with a cold snare. Resected and                         retrieved.                        - Diverticulosis in the sigmoid  colon.                        - The examination was otherwise normal.                        - The rectum, sigmoid colon, descending colon,                         transverse colon, ascending colon and cecum are normal.                        - Non-bleeding internal hemorrhoids. Recommendation:        - Discharge patient to home (with escort).                        - Advance diet as tolerated.                        - Continue present medications.                        - Await pathology results.                        - - Repeat colonoscopy within 6 months with 2 day and                         split prep. Pt should be seen in clinic 1-2 weeks                         prior to the colonoscopy to ensure prep is taken                         appropriately as pt took 2 day prep this time.                        - The findings and recommendations were discussed with                         the patient.                        - The findings and recommendations  were discussed with                         the patient's family.                        - Return to primary care physician as previously                         scheduled.                        - High fiber diet. Procedure Code(s):     --- Professional ---                        [phone], Colonoscopy, flexible; with removal of                         tumor(s), polyp(s), or other lesion(s) by snare                         technique Diagnosis Code(s):     --- Professional ---                        K64.8, Other hemorrhoids                        K63.5, Polyp of colon                        K62.1, Rectal polyp                        R19.5, Other fecal abnormalities                        K57.30, Diverticulosis of large intestine without                         perforation or abscess without bleeding CPT copyright 2019 American Medical Association. All rights reserved. The codes documented in this report are preliminary and upon coder  review may  be revised to meet current compliance requirements.  Vonda Antigua, MD Margretta Sidle B. Bonna Gains MD, MD 01/05/2021 9:50:30 AM This report has been signed electronically. Number of Addenda: 0 Note Initiated On: 01/05/2021 8:25 AM Scope Withdrawal Time: 0 hours 38 minutes 11 seconds  Total Procedure Duration: 0 hours 44 minutes 7 seconds  Estimated Blood Loss:  Estimated blood loss: none.      [facility]

## 2021-01-05 NOTE — H&P (Signed)
Laura Antigua, MD [address], [place], [place], [place], [address] [address], [place], [place], [place], [address] Phone: [phone]  Fax: [phone]  Primary Care Physician:  Birdie Sons, MD   Pre-Procedure History & Physical: HPI:  Kalaya Infantino is a 65 y.o. female is here for a colonoscopy and EGD.   Past Medical History:  Diagnosis Date  . Allergy   . Arthritis   . Asthma   . Depression   . Diabetes mellitus without complication ([place])   . Emphysema of lung ([place])   . GERD (gastroesophageal reflux disease)   . Hyperlipidemia   . Hypertension   . Osteoporosis   . Sleep apnea   . Tobacco abuse counseling     Past Surgical History:  Procedure Laterality Date  . Cardiac catheterization  3/210   70-80% stenosis RCA stent placed. started on Plavix  . CARDIAC CATHETERIZATION    . CORONARY ANGIOPLASTY     stent placed  . [place]  . TUBAL LIGATION    . VAGINAL HYSTERECTOMY     Menometrorrhagia. Excessive bleeding. Unknown if cervix removed.   . vascular stent  03/28/2011   Dr. Delana Meyer, [facility]; Infrarenal    Prior to Admission medications   Medication Sig Start Date End Date Taking? Authorizing Provider  albuterol (PROVENTIL HFA) 108 (90 Base) MCG/ACT inhaler Inhale 2 puffs into the lungs every 6 (six) hours as needed for wheezing or shortness of breath. 04/12/17  Yes Birdie Sons, MD  allopurinol (ZYLOPRIM) 100 MG tablet Take 1 tablet (100 mg total) by mouth daily. 08/04/19  Yes Birdie Sons, MD  aspirin 81 MG tablet Take 81 mg by mouth daily.  03/25/09  Yes [provider]  bisoprolol (ZEBETA) 10 MG tablet Take 1 tablet (10 mg total) by mouth daily. 11/02/20  Yes Birdie Sons, MD  Cholecalciferol 25 MCG (1000 UT) capsule Take by mouth.   Yes [provider]  cyanocobalamin 1000 MCG tablet Take 1,000 mcg by mouth daily.   Yes [provider]  Ibuprofen 200 MG CAPS Take by mouth.    Yes [provider]  lisinopril-hydrochlorothiazide (ZESTORETIC) 20-12.5 MG tablet Take 1 tablet by mouth daily. 11/18/20  Yes Birdie Sons, MD  rosuvastatin (CRESTOR) 20 MG tablet Take 1 tablet (20 mg total) by mouth daily. 11/18/20  Yes Birdie Sons, MD  clopidogrel (PLAVIX) 75 MG tablet Take 1 tablet (75 mg total) by mouth daily. 10/03/20   Birdie Sons, MD  gabapentin (NEURONTIN) 300 MG capsule Take 1 capsule (300 mg total) by mouth 2 (two) times daily AND 2 capsules (600 mg total) at bedtime. Take 1 capsule (300 mg total) by mouth 3 (three) times daily.. Patient taking differently: Take 1 capsule (300 mg total) by mouth 2 (two) times daily AND 2 capsules (600 mg total) at bedtime. 03/10/20 09/22/20  Milinda Pointer, MD  loratadine (CLARITIN) 10 MG tablet Take 10 mg by mouth daily as needed.  Patient not taking: No sig reported    [provider]  montelukast (SINGULAIR) 10 MG tablet Take 1 tablet (10 mg total) by mouth at bedtime. Patient not taking: No sig reported 04/20/20   Birdie Sons, MD  oxyCODONE (ROXICODONE) 5 MG immediate release tablet Take 1 tablet (5 mg total) by mouth daily as needed for severe pain. Patient not taking: Reported on 12/07/2020 08/19/20 09/18/20  Milinda Pointer, MD  Semaglutide, 1 MG/DOSE, (OZEMPIC, 1 MG/DOSE,) 2 MG/1.5ML  SOPN Inject 1 mg into the skin once a week. 04/30/20   Birdie Sons, MD    Allergies as of 12/07/2020 - Review Complete 12/07/2020  Allergen Reaction Noted  . Atorvastatin Other (See Comments) 09/25/2016  . Omeprazole Nausea And Vomiting 07/28/2015  . Bupropion Nausea Only 02/12/2019    Family History  Problem Relation Age of Onset  . Hypertension Mother   . Coronary artery disease Mother   . Heart attack Mother        acute  . Cancer Mother   . Alcohol abuse Father   . Depression Father   . Hypertension Father   . Heart attack Father 42       acute  . Alcohol abuse Sister   . Hyperlipidemia Sister   .  Hypertension Sister   . Cancer Sister 63  . Heart attack Sister        x's 2  . Coronary artery disease Sister 66       x's 2  . Breast cancer Sister 76    Social History   Socioeconomic History  . Marital status: Divorced    Spouse name: Not on file  . Number of children: 2  . Years of education: H/S  . Highest education level: High school graduate  Occupational History  . Occupation: Disabled  . Occupation: retired  Tobacco Use  . Smoking status: Current Every Day Smoker    Packs/day: 1.00    Years: 44.50    Pack years: 44.50    Types: Cigarettes  . Smokeless tobacco: Never Used  . Tobacco comment: 12/23/19 states she quit for 3 months and then started back. Pt is going to talk with MD about this.  Vaping Use  . Vaping Use: Never used  Substance and Sexual Activity  . Alcohol use: Yes    Comment: 1 drink every 3-4 months  . Drug use: No  . Sexual activity: Not on file  Other Topics Concern  . Not on file  Social History Narrative  . Not on file   Social Determinants of Health   Financial Resource Strain: Low Risk   . Difficulty of Paying Living Expenses: Not hard at all  Food Insecurity: No Food Insecurity  . Worried About Charity fundraiser in the Last Year: Never true  . Ran Out of Food in the Last Year: Never true  Transportation Needs: No Transportation Needs  . Lack of Transportation (Medical): No  . Lack of Transportation (Non-Medical): No  Physical Activity: Inactive  . Days of Exercise per Week: 0 days  . Minutes of Exercise per Session: 0 min  Stress: No Stress Concern Present  . Feeling of Stress : Only a little  Social Connections: Socially Isolated  . Frequency of Communication with Friends and Family: More than three times a week  . Frequency of Social Gatherings with Friends and Family: Never  . Attends Religious Services: Never  . Active Member of Clubs or Organizations: No  . Attends Archivist Meetings: Never  . Marital Status:  Divorced  Human resources officer Violence: Not At Risk  . Fear of Current or Ex-Partner: No  . Emotionally Abused: No  . Physically Abused: No  . Sexually Abused: No    Review of Systems: See HPI, otherwise negative ROS  Physical Exam: BP (!) 116/58   Pulse 72   Temp (!) 95.8 F (35.4 C) (Temporal)   Resp 20   Ht 5' 8.5" (1.74 m)   Wt  113.4 kg   SpO2 98%   BMI 37.46 kg/m  General:   Alert,  pleasant and cooperative in NAD Head:  Normocephalic and atraumatic. Neck:  Supple; no masses or thyromegaly. Lungs:  Clear throughout to auscultation, normal respiratory effort.    Heart:  +S1, +S2, Regular rate and rhythm, No edema. Abdomen:  Soft, nontender and nondistended. Normal bowel sounds, without guarding, and without rebound.   Neurologic:  Alert and  oriented x4;  grossly normal neurologically.  Impression/Plan: Calla Wedekind is here for a colonoscopy to be performed for positive cologuard and EGD for dysphagia  Risks, benefits, limitations, and alternatives regarding the procedures have been reviewed with the patient.  Questions have been answered.  All parties agreeable.   Virgel Manifold, MD  01/05/2021, 8:30 AM

## 2021-01-06 ENCOUNTER — Telehealth: Payer: Self-pay

## 2021-01-06 ENCOUNTER — Encounter: Payer: Self-pay | Admitting: Gastroenterology

## 2021-01-06 ENCOUNTER — Other Ambulatory Visit: Payer: Self-pay | Admitting: Family Medicine

## 2021-01-06 LAB — SURGICAL PATHOLOGY

## 2021-01-06 NOTE — Telephone Encounter (Signed)
[facility] faxed refill request for the following medications:  clopidogrel (PLAVIX) 75 MG tablet   Please advise. Thanks, [employer]

## 2021-01-07 NOTE — Telephone Encounter (Signed)
Refill sent into pharmacy on 01/06/2021.

## 2021-01-11 ENCOUNTER — Other Ambulatory Visit: Payer: Self-pay | Admitting: Gastroenterology

## 2021-01-11 MED ORDER — PANTOPRAZOLE SODIUM 20 MG PO TBEC: 20.0000 mg | DELAYED_RELEASE_TABLET | Freq: Two times a day (BID) | ORAL | 0 refills | Status: DC

## 2021-01-11 MED ORDER — CLARITHROMYCIN 500 MG PO TABS: 500.0000 mg | ORAL_TABLET | Freq: Two times a day (BID) | ORAL | 0 refills | Status: AC

## 2021-01-11 MED ORDER — AMOXICILLIN 500 MG PO TABS: 1000.0000 mg | ORAL_TABLET | Freq: Two times a day (BID) | ORAL | 0 refills | Status: AC

## 2021-01-25 ENCOUNTER — Other Ambulatory Visit: Payer: Self-pay

## 2021-01-25 ENCOUNTER — Ambulatory Visit (INDEPENDENT_AMBULATORY_CARE_PROVIDER_SITE_OTHER): Payer: Medicare Other | Admitting: Family Medicine

## 2021-01-25 ENCOUNTER — Encounter: Payer: Self-pay | Admitting: Family Medicine

## 2021-01-25 VITALS — BP 120/86 | HR 80 | Temp 97.9°F | Resp 18 | Wt 256.0 lb

## 2021-01-25 DIAGNOSIS — I10 Essential (primary) hypertension: Secondary | ICD-10-CM | POA: Diagnosis not present

## 2021-01-25 DIAGNOSIS — F439 Reaction to severe stress, unspecified: Secondary | ICD-10-CM

## 2021-01-25 DIAGNOSIS — E119 Type 2 diabetes mellitus without complications: Secondary | ICD-10-CM | POA: Diagnosis not present

## 2021-01-25 DIAGNOSIS — E559 Vitamin D deficiency, unspecified: Secondary | ICD-10-CM

## 2021-01-25 DIAGNOSIS — E538 Deficiency of other specified B group vitamins: Secondary | ICD-10-CM | POA: Diagnosis not present

## 2021-01-25 NOTE — Progress Notes (Signed)
Established patient visit   Patient: Laura Nielsen   DOB: 23-Jun-1956   66 y.o. Female  MRN: [id number] Visit Date: 01/25/2021  Today's healthcare provider: Lelon Huh, MD   Chief Complaint  Patient presents with   Diabetes   Hypertension   Subjective    HPI  She is here for follow up chronic problems below, but her last sister passed away a few days ago and she is feeling more depressed and anxious than usual.  She did recently have colonoscopy and EGD and is now on treatment for h. Pylori.   Diabetes Mellitus Type II, Follow-up  Lab Results  Component Value Date   HGBA1C 6.0 (A) 09/03/2020   HGBA1C 6.0 (H) 04/20/2020   HGBA1C 6.1 (A) 06/10/2019   Wt Readings from Last 3 Encounters:  01/25/21 256 lb (116.1 kg)  01/05/21 250 lb (113.4 kg)  12/07/20 257 lb (116.6 kg)   Last seen for diabetes 4 months ago.  Management since then includes continue same medications. She reports good compliance with treatment. She is not having side effects.  Symptoms: No fatigue No foot ulcerations  No appetite changes No nausea  No paresthesia of the feet  No polydipsia  No polyuria No visual disturbances   No vomiting     Home blood sugar records: fasting range: blood sugars are not checked  Episodes of hypoglycemia? No    Current insulin regiment: none Most Recent Eye Exam: >1 year ago Current exercise: none Current diet habits: in general, an "unhealthy" diet  Pertinent Labs: Lab Results  Component Value Date   CHOL 126 04/20/2020   HDL 36 (L) 04/20/2020   LDLCALC 66 04/20/2020   TRIG 136 04/20/2020   CHOLHDL 3.5 04/20/2020   Lab Results  Component Value Date   NA 141 04/20/2020   K 3.8 04/20/2020   CREATININE 0.67 04/20/2020   GFRNONAA 94 04/20/2020   GFRAA 108 04/20/2020   GLUCOSE 91 04/20/2020     ---------------------------------------------------------------------------------------------------  Hypertension, follow-up  BP Readings from Last 3  Encounters:  01/25/21 120/86  01/05/21 (!) 143/92  12/07/20 135/80   Wt Readings from Last 3 Encounters:  01/25/21 256 lb (116.1 kg)  01/05/21 250 lb (113.4 kg)  12/07/20 257 lb (116.6 kg)     She was last seen for hypertension 4 months ago.  BP at that visit was 117/77. Management since that visit includes continue same medications.  She reports good compliance with treatment. She is not having side effects.  She is following a Regular diet. She is not exercising. She does smoke.  Use of agents associated with hypertension: NSAIDS.   Outside blood pressures are not checked. Symptoms: Yes chest pain Yes chest pressure  No palpitations No syncope  Yes dyspnea No orthopnea  No paroxysmal nocturnal dyspnea No lower extremity edema   Pertinent labs: Lab Results  Component Value Date   CHOL 126 04/20/2020   HDL 36 (L) 04/20/2020   LDLCALC 66 04/20/2020   TRIG 136 04/20/2020   CHOLHDL 3.5 04/20/2020   Lab Results  Component Value Date   NA 141 04/20/2020   K 3.8 04/20/2020   CREATININE 0.67 04/20/2020   GFRNONAA 94 04/20/2020   GFRAA 108 04/20/2020   GLUCOSE 91 04/20/2020     The ASCVD Risk score (Goff DC Jr., et al., 2013) failed to calculate for the following reasons:   The valid total cholesterol range is 130 to 320 mg/dL   ---------------------------------------------------------------------------------------------------  Follow up  for Smoking greater than 30 pack years:  The patient was last seen for this 4 months ago. Changes made at last visit include starting back on 1 tablet daily of Bupropion 150mg . She did not tolerate 2 x 150 bupropion.  She reports poor compliance with treatment. Patient stopped taking medication because the higher dose made her sick and the lower dose didn't help to control symptoms.  She feels that condition is Worse.   -----------------------------------------------------------------------------------------       Medications: Outpatient Medications Prior to Visit  Medication Sig   albuterol (PROVENTIL HFA) 108 (90 Base) MCG/ACT inhaler Inhale 2 puffs into the lungs every 6 (six) hours as needed for wheezing or shortness of breath.   allopurinol (ZYLOPRIM) 100 MG tablet Take 1 tablet (100 mg total) by mouth daily.   amoxicillin (AMOXIL) 500 MG tablet Take 2 tablets (1,000 mg total) by mouth 2 (two) times daily for 14 days.   aspirin 81 MG tablet Take 81 mg by mouth daily.    bisoprolol (ZEBETA) 10 MG tablet Take 1 tablet (10 mg total) by mouth daily.   Cholecalciferol 25 MCG (1000 UT) capsule Take by mouth.   clarithromycin (BIAXIN) 500 MG tablet Take 1 tablet (500 mg total) by mouth 2 (two) times daily for 14 days.   clopidogrel (PLAVIX) 75 MG tablet Take 1 tablet (75 mg total) by mouth daily.   cyanocobalamin 1000 MCG tablet Take 1,000 mcg by mouth daily.   Ibuprofen 200 MG CAPS Take by mouth.    lisinopril-hydrochlorothiazide (ZESTORETIC) 20-12.5 MG tablet Take 1 tablet by mouth daily.   loratadine (CLARITIN) 10 MG tablet Take 10 mg by mouth daily as needed.   montelukast (SINGULAIR) 10 MG tablet Take 1 tablet (10 mg total) by mouth at bedtime.   pantoprazole (PROTONIX) 20 MG tablet Take 1 tablet (20 mg total) by mouth in the morning and at bedtime for 14 days.   rosuvastatin (CRESTOR) 20 MG tablet Take 1 tablet (20 mg total) by mouth daily.   Semaglutide, 1 MG/DOSE, (OZEMPIC, 1 MG/DOSE,) 2 MG/1.5ML SOPN Inject 1 mg into the skin once a week.   gabapentin (NEURONTIN) 300 MG capsule Take 1 capsule (300 mg total) by mouth 2 (two) times daily AND 2 capsules (600 mg total) at bedtime. Take 1 capsule (300 mg total) by mouth 3 (three) times daily.. (Patient taking differently: Take 1 capsule (300 mg total) by mouth 2 (two) times daily AND 2 capsules (600 mg total) at bedtime.)   oxyCODONE (ROXICODONE) 5 MG immediate release tablet Take 1 tablet (5 mg total) by mouth daily as needed for  severe pain. (Patient not taking: Reported on 12/07/2020)   No facility-administered medications prior to visit.    Review of Systems  Constitutional: Negative for appetite change, chills, fatigue and fever.  Respiratory: Negative for chest tightness and shortness of breath.   Cardiovascular: Negative for chest pain and palpitations.  Gastrointestinal: Negative for abdominal pain, nausea and vomiting.  Neurological: Negative for dizziness and weakness.       Objective    BP 120/86 (BP Location: Left Arm, Patient Position: Sitting, Cuff Size: Large)    Pulse 80    Temp 97.9 F (36.6 C) (Temporal)    Resp 18    Wt 256 lb (116.1 kg)    SpO2 95% Comment: room air   BMI 38.36 kg/m     Physical Exam   General: Appearance:    Obese female in no acute distress  Eyes:  PERRL, conjunctiva/corneas clear, EOM's intact       Lungs:     Clear to auscultation bilaterally, respirations unlabored  Heart:    Normal heart rate. Normal rhythm.  2/6 harsh, crescendo-decrescendo, systolic murmur at right upper sternal border  MS:   All extremities are intact.   Neurologic:   Awake, alert, oriented x 3. No apparent focal neurological           defect.          Assessment & Plan     1. Type 2 diabetes mellitus without complication, without long-term current use of insulin (HCC)  - CBC - Lipid panel - Comprehensive metabolic panel - Hemoglobin A1c  2. Benign essential HTN Up today due to stress related to recent passing of her sister. Continue current medications.    3. Vitamin D insufficiency  - VITAMIN D 25 Hydroxy (Vit-D Deficiency, Fractures)  4. B12 deficiency  - Vitamin B12  5. Situational stress        The entirety of the information documented in the History of Present Illness, Review of Systems and Physical Exam were personally obtained by me. Portions of this information were initially documented by the CMA and reviewed by me for thoroughness and accuracy.      Lelon Huh, MD  [facility] [phone] (phone) [phone] (fax)  [place]

## 2021-01-26 LAB — LIPID PANEL
Chol/HDL Ratio: 4.1 ratio (ref 0.0–4.4)
Cholesterol, Total: 136 mg/dL (ref 100–199)
HDL: 33 mg/dL — ABNORMAL LOW (ref >39)
LDL Chol Calc (NIH): 77 mg/dL (ref 0–99)
Triglycerides: 146 mg/dL (ref 0–149)
VLDL Cholesterol Cal: 26 mg/dL (ref 5–40)

## 2021-01-26 LAB — COMPREHENSIVE METABOLIC PANEL
ALT: 25 IU/L (ref 0–32)
AST: 24 IU/L (ref 0–40)
Albumin/Globulin Ratio: 1.7 (ref 1.2–2.2)
Albumin: 4.3 g/dL (ref 3.8–4.8)
Alkaline Phosphatase: 115 IU/L (ref 44–121)
BUN/Creatinine Ratio: 21 (ref 12–28)
BUN: 16 mg/dL (ref 8–27)
Bilirubin Total: 0.7 mg/dL (ref 0.0–1.2)
CO2: 22 mmol/L (ref 20–29)
Calcium: 10 mg/dL (ref 8.7–10.3)
Chloride: 100 mmol/L (ref 96–106)
Creatinine, Ser: 0.78 mg/dL (ref 0.57–1.00)
GFR calc Af Amer: 93 mL/min/{1.73_m2} (ref >59)
GFR calc non Af Amer: 81 mL/min/{1.73_m2} (ref >59)
Globulin, Total: 2.5 g/dL (ref 1.5–4.5)
Glucose: 136 mg/dL — ABNORMAL HIGH (ref 65–99)
Potassium: 3.4 mmol/L — ABNORMAL LOW (ref 3.5–5.2)
Sodium: 141 mmol/L (ref 134–144)
Total Protein: 6.8 g/dL (ref 6.0–8.5)

## 2021-01-26 LAB — CBC
Hematocrit: 49 % — ABNORMAL HIGH (ref 34.0–46.6)
Hemoglobin: 16.3 g/dL — ABNORMAL HIGH (ref 11.1–15.9)
MCH: 28.6 pg (ref 26.6–33.0)
MCHC: 33.3 g/dL (ref 31.5–35.7)
MCV: 86 fL (ref 79–97)
Platelets: 227 10*3/uL (ref 150–450)
RBC: 5.69 x10E6/uL — ABNORMAL HIGH (ref 3.77–5.28)
RDW: 13.5 % (ref 11.7–15.4)
WBC: 11.5 10*3/uL — ABNORMAL HIGH (ref 3.4–10.8)

## 2021-01-26 LAB — VITAMIN D 25 HYDROXY (VIT D DEFICIENCY, FRACTURES): Vit D, 25-Hydroxy: 28.6 ng/mL — ABNORMAL LOW (ref 30.0–100.0)

## 2021-01-26 LAB — HEMOGLOBIN A1C
Est. average glucose Bld gHb Est-mCnc: 131 mg/dL
Hgb A1c MFr Bld: 6.2 % — ABNORMAL HIGH (ref 4.8–5.6)

## 2021-01-26 LAB — VITAMIN B12: Vitamin B-12: 567 pg/mL (ref 232–1245)

## 2021-02-09 NOTE — Progress Notes (Unsigned)
Subjective:   Laura Nielsen is a 65 y.o. female who presents for Medicare Annual (Subsequent) preventive examination.  I connected with Laura Nielsen today by telephone and verified that I am speaking with the correct person using two identifiers. Location patient: home Location provider: work Persons participating in the virtual visit: patient, provider.   I discussed the limitations, risks, security and privacy concerns of performing an evaluation and management service by telephone and the availability of in person appointments. I also discussed with the patient that there may be a patient responsible charge related to this service. The patient expressed understanding and verbally consented to this telephonic visit.    Interactive audio and video telecommunications were attempted between this provider and patient, however failed, due to patient having technical difficulties OR patient did not have access to video capability.  We continued and completed visit with audio only.   Review of Systems    N/A  Cardiac Risk Factors include: diabetes mellitus;obesity (BMI >30kg/m2);dyslipidemia;hypertension;smoking/ tobacco exposure     Objective:    There were no vitals filed for this visit. There is no height or weight on file to calculate BMI.  Advanced Directives 02/10/2021 01/05/2021 09/22/2020 02/05/2020 01/15/2020 02/04/2019 07/26/2017  Does Patient Have a Medical Advance Directive? No No No No No No No  Type of Advance Directive - - - - - - -  Would patient like information on creating a medical advance directive? No - Patient declined No - Patient declined No - Patient declined No - Patient declined - No - Patient declined No - Patient declined    Current Medications (verified) Outpatient Encounter Medications as of 02/10/2021  Medication Sig  . albuterol (PROVENTIL HFA) 108 (90 Base) MCG/ACT inhaler Inhale 2 puffs into the lungs every 6 (six) hours as needed for wheezing or shortness of  breath.  . allopurinol (ZYLOPRIM) 100 MG tablet Take 1 tablet (100 mg total) by mouth daily.  Marland Kitchen aspirin 81 MG tablet Take 81 mg by mouth daily.   . bisoprolol (ZEBETA) 10 MG tablet Take 1 tablet (10 mg total) by mouth daily.  . Cholecalciferol 25 MCG (1000 UT) capsule Take 1,000 Units by mouth daily.  . clopidogrel (PLAVIX) 75 MG tablet Take 1 tablet (75 mg total) by mouth daily.  . cyanocobalamin 1000 MCG tablet Take 1,000 mcg by mouth daily.  Marland Kitchen gabapentin (NEURONTIN) 300 MG capsule Take 1 capsule (300 mg total) by mouth 2 (two) times daily AND 2 capsules (600 mg total) at bedtime. Take 1 capsule (300 mg total) by mouth 3 (three) times daily.. (Patient taking differently: Take 1 capsule (300 mg total) by mouth 2 (two) times daily AND 2 capsules (600 mg total) at bedtime.)  . Ibuprofen 200 MG CAPS Take by mouth as needed.  Marland Kitchen lisinopril-hydrochlorothiazide (ZESTORETIC) 20-12.5 MG tablet Take 1 tablet by mouth daily.  Marland Kitchen loratadine (CLARITIN) 10 MG tablet Take 10 mg by mouth daily as needed.  Marland Kitchen oxyCODONE (ROXICODONE) 5 MG immediate release tablet Take 1 tablet (5 mg total) by mouth daily as needed for severe pain.  . rosuvastatin (CRESTOR) 20 MG tablet Take 1 tablet (20 mg total) by mouth daily.  . Semaglutide, 1 MG/DOSE, (OZEMPIC, 1 MG/DOSE,) 2 MG/1.5ML SOPN Inject 1 mg into the skin once a week.  . montelukast (SINGULAIR) 10 MG tablet Take 1 tablet (10 mg total) by mouth at bedtime. (Patient not taking: Reported on 02/10/2021)  . pantoprazole (PROTONIX) 20 MG tablet Take 1 tablet (20 mg total) by  mouth in the morning and at bedtime for 14 days. (Patient not taking: Reported on 02/10/2021)   No facility-administered encounter medications on file as of 02/10/2021.    Allergies (verified) Atorvastatin, Omeprazole, and Bupropion   History: Past Medical History:  Diagnosis Date  . Allergy   . Arthritis   . Asthma   . Depression   . Diabetes mellitus without complication ([place])   . Emphysema of  lung ([place])   . GERD (gastroesophageal reflux disease)   . Hyperlipidemia   . Hypertension   . Osteoporosis   . Sleep apnea   . Tobacco abuse counseling    Past Surgical History:  Procedure Laterality Date  . Cardiac catheterization  3/210   70-80% stenosis RCA stent placed. started on Plavix  . CARDIAC CATHETERIZATION    . COLONOSCOPY WITH PROPOFOL N/A 01/05/2021   Procedure: COLONOSCOPY WITH PROPOFOL;  Surgeon: Virgel Manifold, MD;  Location: [facility] ENDOSCOPY;  Service: Endoscopy;  Laterality: N/A;  . CORONARY ANGIOPLASTY     stent placed  . ESOPHAGOGASTRODUODENOSCOPY (EGD) WITH PROPOFOL N/A 01/05/2021   Procedure: ESOPHAGOGASTRODUODENOSCOPY (EGD) WITH PROPOFOL;  Surgeon: Virgel Manifold, MD;  Location: [facility] ENDOSCOPY;  Service: Endoscopy;  Laterality: N/A;  . KIDNEY STONE SURGERY  1999  . TUBAL LIGATION    . VAGINAL HYSTERECTOMY     Menometrorrhagia. Excessive bleeding. Unknown if cervix removed.   . vascular stent  03/28/2011   Dr. Delana Meyer, [facility]; Infrarenal   Family History  Problem Relation Age of Onset  . Hypertension Mother   . Coronary artery disease Mother   . Heart attack Mother        acute  . Cancer Mother   . Alcohol abuse Father   . Depression Father   . Hypertension Father   . Heart attack Father 30       acute  . Alcohol abuse Sister   . Hyperlipidemia Sister   . Hypertension Sister   . Cancer Sister 31  . Heart attack Sister        x's 2  . Coronary artery disease Sister 57       x's 2  . Breast cancer Sister 38   Social History   Socioeconomic History  . Marital status: Divorced    Spouse name: Not on file  . Number of children: 2  . Years of education: H/S  . Highest education level: High school graduate  Occupational History  . Occupation: Disabled  . Occupation: retired  Tobacco Use  . Smoking status: Current Every Day Smoker    Packs/day: 1.00    Years: 44.50    Pack years: 44.50    Types: Cigarettes  . Smokeless tobacco: Never  Used  . Tobacco comment: 12/23/19 states she quit for 3 months and then started back. Pt is going to talk with MD about this.  Vaping Use  . Vaping Use: Never used  Substance and Sexual Activity  . Alcohol use: Yes    Comment: rarely - once a year  . Drug use: No  . Sexual activity: Not on file  Other Topics Concern  . Not on file  Social History Narrative  . Not on file   Social Determinants of Health   Financial Resource Strain: Low Risk   . Difficulty of Paying Living Expenses: Not hard at all  Food Insecurity: No Food Insecurity  . Worried About Charity fundraiser in the Last Year: Never true  . Ran Out of Food in the  Last Year: Never true  Transportation Needs: No Transportation Needs  . Lack of Transportation (Medical): No  . Lack of Transportation (Non-Medical): No  Physical Activity: Inactive  . Days of Exercise per Week: 0 days  . Minutes of Exercise per Session: 0 min  Stress: Stress Concern Present  . Feeling of Stress : To some extent  Social Connections: Socially Isolated  . Frequency of Communication with Friends and Family: More than three times a week  . Frequency of Social Gatherings with Friends and Family: Once a week  . Attends Religious Services: Never  . Active Member of Clubs or Organizations: No  . Attends Archivist Meetings: Never  . Marital Status: Divorced    Tobacco Counseling Ready to quit: No Counseling given: No Comment: 12/23/19 states she quit for 3 months and then started back. Pt is going to talk with MD about this.   Clinical Intake:  Pre-visit preparation completed: Yes  Pain : No/denies pain     Nutritional Risks: None Diabetes: Yes (Prediabetic)  How often do you need to have someone help you when you read instructions, pamphlets, or other written materials from your doctor or pharmacy?: 1 - Never  Diabetic? Yes  Nutrition Risk Assessment:  Has the patient had any N/V/D within the last 2 months?  No  Does  the patient have any non-healing wounds?  No  Has the patient had any unintentional weight loss or weight gain?  No   Diabetes:  Is the patient diabetic? Prediabetic If diabetic, was a CBG obtained today?  No  Did the patient bring in their glucometer from home?  No  How often do you monitor your CBG's? Does not check BS.   Financial Strains and Diabetes Management:  Are you having any financial strains with the device, your supplies or your medication? No .  Does the patient want to be seen by Chronic Care Management for management of their diabetes?  No  Would the patient like to be referred to a Nutritionist or for Diabetic Management?  No   Diabetic Exams:  Diabetic Eye Exam: Overdue for diabetic eye exam. Pt has been advised about the importance in completing this exam. Patient advised to call and schedule an eye exam. Diabetic Foot Exam: Overdue, Pt has been advised about the importance in completing this exam.    Interpreter Needed?: No  Information entered by :: [facility], LPN   Activities of Daily Living In your present state of health, do you have any difficulty performing the following activities: 02/10/2021  Hearing? Y  Comment Due to tinnitus in both ears. Wears hearing aids.  Vision? N  Difficulty concentrating or making decisions? N  Walking or climbing stairs? Y  Comment Due to back pain.  Dressing or bathing? N  Doing errands, shopping? N  Preparing Food and eating ? N  Using the Toilet? N  In the past six months, have you accidently leaked urine? Y  Comment Occasionally due to urges.  Do you have problems with loss of bowel control? N  Managing your Medications? N  Managing your Finances? N  Housekeeping or managing your Housekeeping? Y  Comment Grandson assists with heavy duty cleaning or yardwork.  Some recent data might be hidden    Patient Care Team: Birdie Sons, MD as PCP - General (Family Medicine) Ubaldo Glassing, Javier Docker, MD as Consulting  Physician (Cardiology) Schnier, Dolores Lory, MD (Vascular Surgery) Vladimir Crofts, MD as Consulting Physician (Neurology) Milinda Pointer, MD  as Referring Physician (Pain Medicine) Pa, [place] (Optometry) Fath, Javier Docker, MD as Consulting Physician (Cardiology) Virgel Manifold, MD as Consulting Physician (Gastroenterology) Gae Dry, MD as Referring Physician (Obstetrics and Gynecology)  Indicate any recent Medical Services you may have received from other than [facility] providers in the past year (date may be approximate).     Assessment:   This is a routine wellness examination for Keriann.  Hearing/Vision screen No exam data present  Dietary issues and exercise activities discussed: Current Exercise Habits: The patient does not participate in regular exercise at present, Exercise limited by: orthopedic condition(s)  Goals    . Have 3 meals a day     Recommend to decrease portion sizes by eating 3 small healthy meals and at least 2 healthy snacks per day.    . Quit Smoking     Recommend to continue efforts to reduce smoking habits until no longer smoking.       Depression Screen PHQ 2/9 Scores 01/25/2021 09/03/2020 04/20/2020 02/05/2020 02/04/2019 02/05/2018 01/30/2018  PHQ - 2 Score 4 3 0 1 2 0 1  PHQ- 9 Score 13 9 6  - 6 - 8    Fall Risk Fall Risk  02/10/2021 02/05/2020 02/04/2019 01/07/2019 02/05/2018  Falls in the past year? 0 1 0 1 No  Number falls in past yr: 0 0 - 0 -  Injury with Fall? 0 0 - 0 -  Risk for fall due to : Impaired mobility;Impaired balance/gait Impaired balance/gait - - -  Follow up - Falls prevention discussed - - -    FALL RISK PREVENTION PERTAINING TO THE HOME:  Any stairs in or around the home? Yes  If so, are there any without handrails? No  Home free of loose throw rugs in walkways, pet beds, electrical cords, etc? Yes  Adequate lighting in your home to reduce risk of falls? Yes   ASSISTIVE DEVICES UTILIZED TO PREVENT FALLS:  Life alert?  No  Use of a cane, walker or w/c? No  Grab bars in the bathroom? No  Shower chair or bench in shower? No  Elevated toilet seat or a handicapped toilet? No    Cognitive Function: Normal cognitive status assessed by observation by this Nurse Health Advisor. No abnormalities found.          Immunizations Immunization History  Administered Date(s) Administered  . Influenza,inj,Quad PF,6+ Mos 09/03/2020  . PFIZER(Purple Top)SARS-COV-2 Vaccination 02/20/2020, 03/19/2020, 11/30/2020  . Pneumococcal Polysaccharide-23 09/25/2016  . Tdap 08/30/2017    TDAP status: Up to date  Flu Vaccine status: Up to date  Covid-19 vaccine status: Completed vaccines  Qualifies for Shingles Vaccine? Yes   Zostavax completed No   Shingrix Completed?: No.    Education has been provided regarding the importance of this vaccine. Patient has been advised to call insurance company to determine out of pocket expense if they have not yet received this vaccine. Advised may also receive vaccine at local pharmacy or Health Dept. Verbalized acceptance and understanding.  Screening Tests Health Maintenance  Topic Date Due  . FOOT EXAM  Never done  . OPHTHALMOLOGY EXAM  01/09/2019  . PAP SMEAR-Modifier  01/30/2021  . COVID-19 Vaccine (4 - Booster for Pfizer series) 05/31/2021  . HEMOGLOBIN A1C  07/25/2021  . COLONOSCOPY (Pts 45-3yrs Insurance coverage will need to be confirmed)  01/05/2022  . MAMMOGRAM  02/12/2022  . TETANUS/TDAP  08/31/2027  . INFLUENZA VACCINE  Completed  . Hepatitis C Screening  Completed  .  HIV Screening  Completed  . HPV VACCINES  Aged Out    Health Maintenance  Health Maintenance Due  Topic Date Due  . FOOT EXAM  Never done  . OPHTHALMOLOGY EXAM  01/09/2019  . PAP SMEAR-Modifier  01/30/2021    Colorectal cancer screening: Type of screening: Colonoscopy. Completed 01/05/21. Repeat every year.  Mammogram status: Completed 02/13/20. Repeat every year. Scheduled 03/02/21  Lung  Cancer Screening: (Low Dose CT Chest recommended if Age 53-80 years, 30 pack-year currently smoking OR have quit w/in 15years.) does qualify however had this completed today. Repeat yearly.   Additional Screening:  Hepatitis C Screening: Up to date  Vision Screening: Recommended annual ophthalmology exams for early detection of glaucoma and other disorders of the eye. Is the patient up to date with their annual eye exam?  Yes  Who is the provider or what is the name of the office in which the patient attends annual eye exams? [place] If pt is not established with a provider, would they like to be referred to a provider to establish care? No .   Dental Screening: Recommended annual dental exams for proper oral hygiene  Community Resource Referral / Chronic Care Management: CRR required this visit?  No   CCM required this visit?  No      Plan:     I have personally reviewed and noted the following in the patient's chart:   . Medical and social history . Use of alcohol, tobacco or illicit drugs  . Current medications and supplements . Functional ability and status . Nutritional status . Physical activity . Advanced directives . List of other physicians . Hospitalizations, surgeries, and ER visits in previous 12 months . Vitals . Screenings to include cognitive, depression, and falls . Referrals and appointments  In addition, I have reviewed and discussed with patient certain preventive protocols, quality metrics, and best practice recommendations. A written personalized care plan for preventive services as well as general preventive health recommendations were provided to patient.     McKenzie [place], [place]   4/43/1540   Nurse Notes: Pt needs a diabetic foot exam at next in office apt. Advised pt to call and schedule a yearly eye exam. Pt to discuss scheduling a pap smear with OBGYN.

## 2021-02-10 ENCOUNTER — Ambulatory Visit (INDEPENDENT_AMBULATORY_CARE_PROVIDER_SITE_OTHER): Payer: Medicare Other

## 2021-02-10 ENCOUNTER — Ambulatory Visit
Admission: RE | Admit: 2021-02-10 | Discharge: 2021-02-10 | Disposition: A | Discharge status: Home / Self Care | Payer: Medicare Other | Source: Ambulatory Visit | Attending: Oncology | Admitting: Oncology

## 2021-02-10 ENCOUNTER — Other Ambulatory Visit: Payer: Self-pay

## 2021-02-10 DIAGNOSIS — J929 Pleural plaque without asbestos: Secondary | ICD-10-CM | POA: Diagnosis not present

## 2021-02-10 DIAGNOSIS — I708 Atherosclerosis of other arteries: Secondary | ICD-10-CM | POA: Diagnosis not present

## 2021-02-10 DIAGNOSIS — I2510 Atherosclerotic heart disease of native coronary artery without angina pectoris: Secondary | ICD-10-CM | POA: Diagnosis not present

## 2021-02-10 DIAGNOSIS — Z0000 Encounter for general adult medical examination without abnormal findings: Secondary | ICD-10-CM

## 2021-02-10 DIAGNOSIS — R918 Other nonspecific abnormal finding of lung field: Secondary | ICD-10-CM | POA: Diagnosis not present

## 2021-02-10 DIAGNOSIS — Z87891 Personal history of nicotine dependence: Secondary | ICD-10-CM

## 2021-02-10 DIAGNOSIS — J432 Centrilobular emphysema: Secondary | ICD-10-CM | POA: Diagnosis not present

## 2021-02-10 NOTE — Patient Instructions (Signed)
Laura Nielsen , Thank you for taking time to come for your Medicare Wellness Visit. I appreciate your ongoing commitment to your health goals. Please review the following plan we discussed and let me know if I can assist you in the future.   Screening recommendations/referrals: Colonoscopy: Up to date, due 01/2022 Mammogram: Done 02/2021, next scan scheduled 03/02/21 Recommended yearly ophthalmology/optometry visit for glaucoma screening and checkup Recommended yearly dental visit for hygiene and checkup  Vaccinations: Influenza vaccine: Done 09/03/21 Tdap vaccine: Up to date, due 08/2027 Shingles vaccine: Shingrix discussed. Please contact your pharmacy for coverage information.     Advanced directives: Advance directive discussed with you today. Even though you declined this today please call our office should you change your mind and we can give you the proper paperwork for you to fill out.  Conditions/risks identified: Obesity: recommend to decrease portion sizes by eating 3 small healthy meals and at least 2 healthy snacks per day. Smoking cessation discussed today.   Next appointment: 05/25/21 @ 9:40 AM with Dr Caryn Section. Declined scheduling an AWV for 2023 at this time.   Preventive Care 40-64 Years, Female Preventive care refers to lifestyle choices and visits with your health care provider that can promote health and wellness. What does preventive care include?  A yearly physical exam. This is also called an annual well check.  Dental exams once or twice a year.  Routine eye exams. Ask your health care provider how often you should have your eyes checked.  Personal lifestyle choices, including:  Daily care of your teeth and gums.  Regular physical activity.  Eating a healthy diet.  Avoiding tobacco and drug use.  Limiting alcohol use.  Practicing safe sex.  Taking low-dose aspirin daily starting at age 86.  Taking vitamin and mineral supplements as recommended by your health  care provider. What happens during an annual well check? The services and screenings done by your health care provider during your annual well check will depend on your age, overall health, lifestyle risk factors, and family history of disease. Counseling  Your health care provider may ask you questions about your:  Alcohol use.  Tobacco use.  Drug use.  Emotional well-being.  Home and relationship well-being.  Sexual activity.  Eating habits.  Work and work Statistician.  Method of birth control.  Menstrual cycle.  Pregnancy history. Screening  You may have the following tests or measurements:  Height, weight, and BMI.  Blood pressure.  Lipid and cholesterol levels. These may be checked every 5 years, or more frequently if you are over 7 years old.  Skin check.  Lung cancer screening. You may have this screening every year starting at age 68 if you have a 30-pack-year history of smoking and currently smoke or have quit within the past 15 years.  Fecal occult blood test (FOBT) of the stool. You may have this test every year starting at age 20.  Flexible sigmoidoscopy or colonoscopy. You may have a sigmoidoscopy every 5 years or a colonoscopy every 10 years starting at age 88.  Hepatitis C blood test.  Hepatitis B blood test.  Sexually transmitted disease (STD) testing.  Diabetes screening. This is done by checking your blood sugar (glucose) after you have not eaten for a while (fasting). You may have this done every 1-3 years.  Mammogram. This may be done every 1-2 years. Talk to your health care provider about when you should start having regular mammograms. This may depend on whether you have a  family history of breast cancer.  BRCA-related cancer screening. This may be done if you have a family history of breast, ovarian, tubal, or peritoneal cancers.  Pelvic exam and Pap test. This may be done every 3 years starting at age 16. Starting at age 31, this may  be done every 5 years if you have a Pap test in combination with an HPV test.  Bone density scan. This is done to screen for osteoporosis. You may have this scan if you are at high risk for osteoporosis. Discuss your test results, treatment options, and if necessary, the need for more tests with your health care provider. Vaccines  Your health care provider may recommend certain vaccines, such as:  Influenza vaccine. This is recommended every year.  Tetanus, diphtheria, and acellular pertussis (Tdap, Td) vaccine. You may need a Td booster every 10 years.  Zoster vaccine. You may need this after age 30.  Pneumococcal 13-valent conjugate (PCV13) vaccine. You may need this if you have certain conditions and were not previously vaccinated.  Pneumococcal polysaccharide (PPSV23) vaccine. You may need one or two doses if you smoke cigarettes or if you have certain conditions. Talk to your health care provider about which screenings and vaccines you need and how often you need them. This information is not intended to replace advice given to you by your health care provider. Make sure you discuss any questions you have with your health care provider. Document Released: 12/17/2015 Document Revised: 08/09/2016 Document Reviewed: 09/21/2015 Elsevier Interactive Patient Education  2017 [employer] Prevention in the Home Falls can cause injuries. They can happen to people of all ages. There are many things you can do to make your home safe and to help prevent falls. What can I do on the outside of my home?  Regularly fix the edges of walkways and driveways and fix any cracks.  Remove anything that might make you trip as you walk through a door, such as a raised step or threshold.  Trim any bushes or trees on the path to your home.  Use bright outdoor lighting.  Clear any walking paths of anything that might make someone trip, such as rocks or tools.  Regularly check to see if  handrails are loose or broken. Make sure that both sides of any steps have handrails.  Any raised decks and porches should have guardrails on the edges.  Have any leaves, snow, or ice cleared regularly.  Use sand or salt on walking paths during winter.  Clean up any spills in your garage right away. This includes oil or grease spills. What can I do in the bathroom?  Use night lights.  Install grab bars by the toilet and in the tub and shower. Do not use towel bars as grab bars.  Use non-skid mats or decals in the tub or shower.  If you need to sit down in the shower, use a plastic, non-slip stool.  Keep the floor dry. Clean up any water that spills on the floor as soon as it happens.  Remove soap buildup in the tub or shower regularly.  Attach bath mats securely with double-sided non-slip rug tape.  Do not have throw rugs and other things on the floor that can make you trip. What can I do in the bedroom?  Use night lights.  Make sure that you have a light by your bed that is easy to reach.  Do not use any sheets or blankets that are  too big for your bed. They should not hang down onto the floor.  Have a firm chair that has side arms. You can use this for support while you get dressed.  Do not have throw rugs and other things on the floor that can make you trip. What can I do in the kitchen?  Clean up any spills right away.  Avoid walking on wet floors.  Keep items that you use a lot in easy-to-reach places.  If you need to reach something above you, use a strong step stool that has a grab bar.  Keep electrical cords out of the way.  Do not use floor polish or wax that makes floors slippery. If you must use wax, use non-skid floor wax.  Do not have throw rugs and other things on the floor that can make you trip. What can I do with my stairs?  Do not leave any items on the stairs.  Make sure that there are handrails on both sides of the stairs and use them. Fix  handrails that are broken or loose. Make sure that handrails are as long as the stairways.  Check any carpeting to make sure that it is firmly attached to the stairs. Fix any carpet that is loose or worn.  Avoid having throw rugs at the top or bottom of the stairs. If you do have throw rugs, attach them to the floor with carpet tape.  Make sure that you have a light switch at the top of the stairs and the bottom of the stairs. If you do not have them, ask someone to add them for you. What else can I do to help prevent falls?  Wear shoes that:  Do not have high heels.  Have rubber bottoms.  Are comfortable and fit you well.  Are closed at the toe. Do not wear sandals.  If you use a stepladder:  Make sure that it is fully opened. Do not climb a closed stepladder.  Make sure that both sides of the stepladder are locked into place.  Ask someone to hold it for you, if possible.  Clearly mark and make sure that you can see:  Any grab bars or handrails.  First and last steps.  Where the edge of each step is.  Use tools that help you move around (mobility aids) if they are needed. These include:  Canes.  Walkers.  Scooters.  Crutches.  Turn on the lights when you go into a dark area. Replace any light bulbs as soon as they burn out.  Set up your furniture so you have a clear path. Avoid moving your furniture around.  If any of your floors are uneven, fix them.  If there are any pets around you, be aware of where they are.  Review your medicines with your doctor. Some medicines can make you feel dizzy. This can increase your chance of falling. Ask your doctor what other things that you can do to help prevent falls. This information is not intended to replace advice given to you by your health care provider. Make sure you discuss any questions you have with your health care provider. Document Released: 09/16/2009 Document Revised: 04/27/2016 Document Reviewed:  12/25/2014 Elsevier Interactive Patient Education  2017 [employer].

## 2021-02-11 ENCOUNTER — Encounter: Payer: Self-pay | Admitting: *Deleted

## 2021-02-11 ENCOUNTER — Telehealth: Payer: Self-pay | Admitting: *Deleted

## 2021-02-11 NOTE — Telephone Encounter (Signed)
The lesion is 1 of these cystic/solid lesions.  Would recommend PET/CT and referral to thoracic surgery.  Lesion will not be amenable to bronchoscopic biopsy.  Biopsy percutaneously will be challenging as well due to the cystic nature of the lesion.  Renold Don, MD [place] [facility]

## 2021-02-11 NOTE — Telephone Encounter (Signed)
Will obtain pulmonary opinion of Lung Rads 4B results. This has been previously discussed and biopsy attempts in area of bulla are concerning.   IMPRESSION: 1. Increasing prominence of a large irregular thick-walled cystic lesion with multiple areas of nodularity in the left lower lobe, highly concerning for primary bronchogenic adenocarcinoma, categorized as Lung-RADS 4BS, suspicious. Additional imaging evaluation or consultation with Pulmonology or Thoracic Surgery recommended. 2. The "S" modifier above refers to potentially clinically significant non lung cancer related findings. Specifically, there is aortic atherosclerosis, in addition to 3 vessel coronary artery disease. Please note that although the presence of coronary artery calcium documents the presence of coronary artery disease, the severity of this disease and any potential stenosis cannot be assessed on this non-gated CT examination. Assessment for potential risk factor modification, dietary therapy or pharmacologic therapy may be warranted, if clinically indicated. 3. Mild diffuse bronchial wall thickening with very mild centrilobular and paraseptal emphysema; imaging findings suggestive of underlying COPD.  These results will be called to the ordering clinician or representative by the Radiologist Assistant, and communication documented in the PACS or [employer].  Aortic Atherosclerosis (ICD10-I70.0) and Emphysema (ICD10-J43.9).

## 2021-02-15 ENCOUNTER — Telehealth: Payer: Self-pay | Admitting: *Deleted

## 2021-02-15 NOTE — Telephone Encounter (Signed)
After obtaining recommendations from pulmonary and thoracic surgery, patient contacted and reviewed results of CT chest, past PET scan, and past CT scan. Also discussed recommendation for referral to thoracic surgery with PFTs to be done prior. Offered to assist in obtaining appt with PCP or Pulmonologist if she would prefer that. Patient would like to think about her options for a couple days and will let me know how she would like to proceed. Patient knows to contact me for any questions or concerns.

## 2021-02-17 ENCOUNTER — Other Ambulatory Visit: Payer: Self-pay | Admitting: *Deleted

## 2021-02-17 DIAGNOSIS — R918 Other nonspecific abnormal finding of lung field: Secondary | ICD-10-CM

## 2021-02-17 NOTE — Progress Notes (Signed)
Patient called back and requested referral to thoracic surgery and PFTs. Orders entered.

## 2021-02-20 ENCOUNTER — Other Ambulatory Visit: Payer: Self-pay | Admitting: Family Medicine

## 2021-02-20 DIAGNOSIS — E7800 Pure hypercholesterolemia, unspecified: Secondary | ICD-10-CM

## 2021-02-20 NOTE — Telephone Encounter (Signed)
Requested Prescriptions  Pending Prescriptions Disp Refills  . rosuvastatin (CRESTOR) 20 MG tablet [Pharmacy Med Name: ROSUVASTATIN CALCIUM 20 MG TAB] 90 tablet 0    Sig: Take 1 tablet (20 mg total) by mouth daily.     Cardiovascular:  Antilipid - Statins Failed - 02/20/2021 10:20 AM      Failed - LDL in normal range and within 360 days    LDL Chol Calc (NIH)  Date Value Ref Range Status  01/25/2021 77 0 - 99 mg/dL Final         Failed - HDL in normal range and within 360 days    HDL  Date Value Ref Range Status  01/25/2021 33 (L) >39 mg/dL Final         Passed - Total Cholesterol in normal range and within 360 days    Cholesterol, Total  Date Value Ref Range Status  01/25/2021 136 100 - 199 mg/dL Final         Passed - Triglycerides in normal range and within 360 days    Triglycerides  Date Value Ref Range Status  01/25/2021 146 0 - 149 mg/dL Final         Passed - Patient is not pregnant      Passed - Valid encounter within last 12 months    Recent Outpatient Visits          3 weeks ago Type 2 diabetes mellitus without complication, without long-term current use of insulin ([place])   [facility] Birdie Sons, MD   5 months ago Type 2 diabetes mellitus without complication, without long-term current use of insulin ([place])   [facility] Birdie Sons, MD   7 months ago CAFL (chronic airflow limitation) [facility])   [place], [place], PA-C   10 months ago Type 2 diabetes mellitus without complication, without long-term current use of insulin [facility])   [facility] Birdie Sons, MD   1 year ago Acute frontal sinusitis, recurrence not specified   [facility] Birdie Sons, MD      Future Appointments            In 2 weeks Virgel Manifold, MD [place]   In 3 months Fisher, Kirstie Peri, MD [facility] [place]           . lisinopril-hydrochlorothiazide  (ZESTORETIC) 20-12.5 MG tablet [Pharmacy Med Name: LISINOPRIL-HCTZ 20-12.5 MG TAB] 90 tablet 0    Sig: Take 1 tablet by mouth daily.     Cardiovascular:  ACEI + Diuretic Combos Failed - 02/20/2021 10:20 AM      Failed - K in normal range and within 180 days    Potassium  Date Value Ref Range Status  01/25/2021 3.4 (L) 3.5 - 5.2 mmol/L Final  03/28/2013 3.7 3.5 - 5.1 mmol/L Final         Passed - Na in normal range and within 180 days    Sodium  Date Value Ref Range Status  01/25/2021 141 134 - 144 mmol/L Final  03/28/2013 139 136 - 145 mmol/L Final         Passed - Cr in normal range and within 180 days    Creat  Date Value Ref Range Status  08/30/2017 0.60 0.50 - 0.99 mg/dL Final    Comment:    For patients >56 years of age, the reference limit for Creatinine is approximately 13% higher for people identified as African-American. Marland Kitchen  Creatinine, Ser  Date Value Ref Range Status  01/25/2021 0.78 0.57 - 1.00 mg/dL Final    Comment:                   **Effective January 31, 2021 [facility] will begin**                  reporting the 2021 CKD-EPI creatinine equation that                  estimates kidney function without a race variable.    Creatinine, POC  Date Value Ref Range Status  12/12/2017 n/a mg/dL Final         Passed - Ca in normal range and within 180 days    Calcium  Date Value Ref Range Status  01/25/2021 10.0 8.7 - 10.3 mg/dL Final   Calcium, Total  Date Value Ref Range Status  03/28/2013 8.2 (L) 8.5 - 10.1 mg/dL Final         Passed - Patient is not pregnant      Passed - Last BP in normal range    BP Readings from Last 1 Encounters:  01/25/21 120/86         Passed - Valid encounter within last 6 months    Recent Outpatient Visits          3 weeks ago Type 2 diabetes mellitus without complication, without long-term current use of insulin ([place])   [facility] Birdie Sons, MD   5 months ago Type 2 diabetes mellitus without  complication, without long-term current use of insulin ([place])   [facility] Birdie Sons, MD   7 months ago CAFL (chronic airflow limitation) [facility])   [place], [place], PA-C   10 months ago Type 2 diabetes mellitus without complication, without long-term current use of insulin [facility])   [facility] Birdie Sons, MD   1 year ago Acute frontal sinusitis, recurrence not specified   [facility] Birdie Sons, MD      Future Appointments            In 2 weeks Virgel Manifold, MD [place]   In 3 months Fisher, Kirstie Peri, MD [facility] [place]

## 2021-02-24 ENCOUNTER — Other Ambulatory Visit
Admission: RE | Admit: 2021-02-24 | Discharge: 2021-02-24 | Disposition: A | Discharge status: Home / Self Care | Payer: Medicare Other | Source: Ambulatory Visit | Attending: Oncology | Admitting: Oncology

## 2021-02-24 ENCOUNTER — Other Ambulatory Visit: Payer: Self-pay

## 2021-02-24 DIAGNOSIS — Z01812 Encounter for preprocedural laboratory examination: Secondary | ICD-10-CM | POA: Diagnosis not present

## 2021-02-24 DIAGNOSIS — Z20822 Contact with and (suspected) exposure to covid-19: Secondary | ICD-10-CM | POA: Diagnosis not present

## 2021-02-24 LAB — SARS CORONAVIRUS 2 (TAT 6-24 HRS): SARS Coronavirus 2: NEGATIVE

## 2021-02-25 ENCOUNTER — Other Ambulatory Visit: Payer: Self-pay

## 2021-02-25 ENCOUNTER — Ambulatory Visit: Payer: Medicare Other | Attending: Oncology

## 2021-02-25 DIAGNOSIS — R918 Other nonspecific abnormal finding of lung field: Secondary | ICD-10-CM | POA: Insufficient documentation

## 2021-02-25 MED ORDER — ALBUTEROL SULFATE (2.5 MG/3ML) 0.083% IN NEBU
2.5000 mg | INHALATION_SOLUTION | Freq: Once | RESPIRATORY_TRACT | Status: AC
  Administered 2021-02-25: 2.5 mg via RESPIRATORY_TRACT
  Filled 2021-02-25: qty 3

## 2021-03-02 ENCOUNTER — Other Ambulatory Visit: Payer: Self-pay

## 2021-03-02 ENCOUNTER — Institutional Professional Consult (permissible substitution) (INDEPENDENT_AMBULATORY_CARE_PROVIDER_SITE_OTHER): Payer: Medicare Other | Admitting: Thoracic Surgery (Cardiothoracic Vascular Surgery)

## 2021-03-02 ENCOUNTER — Ambulatory Visit
Admission: RE | Admit: 2021-03-02 | Discharge: 2021-03-02 | Disposition: A | Discharge status: Home / Self Care | Payer: Medicare Other | Source: Ambulatory Visit | Attending: Family Medicine | Admitting: Family Medicine

## 2021-03-02 ENCOUNTER — Other Ambulatory Visit: Payer: Self-pay | Admitting: Family Medicine

## 2021-03-02 VITALS — BP 134/82 | HR 77 | Resp 20 | Ht 68.0 in

## 2021-03-02 DIAGNOSIS — Z1231 Encounter for screening mammogram for malignant neoplasm of breast: Secondary | ICD-10-CM | POA: Diagnosis not present

## 2021-03-02 DIAGNOSIS — R911 Solitary pulmonary nodule: Secondary | ICD-10-CM

## 2021-03-02 NOTE — Progress Notes (Signed)
PCP is Fisher, Kirstie Peri, MD Referring Provider is Jacquelin Hawking, NP  Chief Complaint  Patient presents with  . Lung Lesion    Initial surgical consult, CT chest 3/10, PET 08/11/20, PFT 9/21    HPI: [name] is sent for consultation regarding a cavitary left lower lobe lung lesion.  Laura Nielsen is a 65 year old woman with a history of tobacco abuse, lung nodules, obesity, CAD, hypertension, hyperlipidemia, asthma, reflux, type 2 diabetes, sleep apnea requiring CPAP, osteoporosis, depression, and chronic pain.  She started smoking about age 23.  She smoked about 2 packs a day until recently.  She has been trying to quit and is down to less than half a pack a day.  She has been followed in the low-dose lung cancer screening program for several years now.  She has had multiple small groundglass lung nodules.  She had a CT in August 2021 which showed an enlarging solid nodule at the periphery of a cystic area in the left lower lobe.  The cystic area also was enlarging.  She had a PET/CT which did not show any significant metabolic activity.  She recently had another CT which showed the left lower lobe lesion to be the same to slightly larger.  She has a history of coronary disease but has not had any recent angina.  She does suffer from back pain which limits her walking.  She says she can walk 2 blocks without getting short of breath on a good day.  She thinks sometimes her shortness of breath is due to pain in her back and legs.  She does have claudication in her calves.  She has had headaches on an almost daily basis for years.  Complains of a dry cough.  She does use CPAP at night for her sleep apnea.  She has not had dizzy spells not necessarily related to exertion.  She is noticed that they happen sometimes when she is washing dishes.  She had intentionally lost weight previously but is currently gained 4 pounds over the past 3 months.  Zubrod Score: At the time of surgery this patient's most  appropriate activity status/level should be described as: []     0    Normal activity, no symptoms []     1    Restricted in physical strenuous activity but ambulatory, able to do out light work [x]     2    Ambulatory and capable of self care, unable to do work activities, up and about >50 % of waking hours                              []     3    Only limited self care, in bed greater than 50% of waking hours []     4    Completely disabled, no self care, confined to bed or chair []     5    Moribund  Past Medical History:  Diagnosis Date  . Allergy   . Arthritis   . Asthma   . Depression   . Diabetes mellitus without complication ([place])   . Emphysema of lung ([place])   . GERD (gastroesophageal reflux disease)   . Hyperlipidemia   . Hypertension   . Osteoporosis   . Sleep apnea   . Tobacco abuse counseling     Past Surgical History:  Procedure Laterality Date  . Cardiac catheterization  3/210   70-80% stenosis RCA stent placed.  started on Plavix  . CARDIAC CATHETERIZATION    . COLONOSCOPY WITH PROPOFOL N/A 01/05/2021   Procedure: COLONOSCOPY WITH PROPOFOL;  Surgeon: Virgel Manifold, MD;  Location: [facility] ENDOSCOPY;  Service: Endoscopy;  Laterality: N/A;  . CORONARY ANGIOPLASTY     stent placed  . ESOPHAGOGASTRODUODENOSCOPY (EGD) WITH PROPOFOL N/A 01/05/2021   Procedure: ESOPHAGOGASTRODUODENOSCOPY (EGD) WITH PROPOFOL;  Surgeon: Virgel Manifold, MD;  Location: [facility] ENDOSCOPY;  Service: Endoscopy;  Laterality: N/A;  . KIDNEY STONE SURGERY  1999  . TUBAL LIGATION    . VAGINAL HYSTERECTOMY     Menometrorrhagia. Excessive bleeding. Unknown if cervix removed.   . vascular stent  03/28/2011   Dr. Delana Meyer, [facility]; Infrarenal    Family History  Problem Relation Age of Onset  . Hypertension Mother   . Coronary artery disease Mother   . Heart attack Mother        acute  . Cancer Mother   . Alcohol abuse Father   . Depression Father   . Hypertension Father   . Heart attack Father 74        acute  . Alcohol abuse Sister   . Hyperlipidemia Sister   . Hypertension Sister   . Cancer Sister 19  . Heart attack Sister        x's 2  . Coronary artery disease Sister 41       x's 2  . Breast cancer Sister 14    Social History Social History   Tobacco Use  . Smoking status: Current Every Day Smoker    Packs/day: 1.00    Years: 44.50    Pack years: 44.50    Types: Cigarettes  . Smokeless tobacco: Never Used  . Tobacco comment: 12/23/19 states she quit for 3 months and then started back. Pt is going to talk with MD about this.  Vaping Use  . Vaping Use: Never used  Substance Use Topics  . Alcohol use: Yes    Comment: rarely - once a year  . Drug use: No    Current Outpatient Medications  Medication Sig Dispense Refill  . albuterol (PROVENTIL HFA) 108 (90 Base) MCG/ACT inhaler Inhale 2 puffs into the lungs every 6 (six) hours as needed for wheezing or shortness of breath. 18 g 11  . allopurinol (ZYLOPRIM) 100 MG tablet Take 1 tablet (100 mg total) by mouth daily. 90 tablet 0  . aspirin 81 MG tablet Take 81 mg by mouth daily.     . bisoprolol (ZEBETA) 10 MG tablet Take 1 tablet (10 mg total) by mouth daily. 90 tablet 0  . Cholecalciferol 25 MCG (1000 UT) capsule Take 1,000 Units by mouth daily.    . clopidogrel (PLAVIX) 75 MG tablet Take 1 tablet (75 mg total) by mouth daily. 90 tablet 0  . cyanocobalamin 1000 MCG tablet Take 1,000 mcg by mouth daily.    . Ibuprofen 200 MG CAPS Take by mouth as needed.    Marland Kitchen lisinopril-hydrochlorothiazide (ZESTORETIC) 20-12.5 MG tablet Take 1 tablet by mouth daily. 90 tablet 0  . loratadine (CLARITIN) 10 MG tablet Take 10 mg by mouth daily as needed.    . montelukast (SINGULAIR) 10 MG tablet Take 1 tablet (10 mg total) by mouth at bedtime. 30 tablet 1  . rosuvastatin (CRESTOR) 20 MG tablet Take 1 tablet (20 mg total) by mouth daily. 90 tablet 0  . Semaglutide, 1 MG/DOSE, (OZEMPIC, 1 MG/DOSE,) 2 MG/1.5ML SOPN Inject 1 mg into the skin  once a week. 2  pen 11  . gabapentin (NEURONTIN) 300 MG capsule Take 1 capsule (300 mg total) by mouth 2 (two) times daily AND 2 capsules (600 mg total) at bedtime. Take 1 capsule (300 mg total) by mouth 3 (three) times daily.. (Patient taking differently: Take 1 capsule (300 mg total) by mouth 2 (two) times daily AND 2 capsules (600 mg total) at bedtime.) 120 capsule 5  . oxyCODONE (ROXICODONE) 5 MG immediate release tablet Take 1 tablet (5 mg total) by mouth daily as needed for severe pain. 15 tablet 0  . pantoprazole (PROTONIX) 20 MG tablet Take 1 tablet (20 mg total) by mouth in the morning and at bedtime for 14 days. (Patient not taking: Reported on 02/10/2021) 28 tablet 0   No current facility-administered medications for this visit.    Allergies  Allergen Reactions  . Atorvastatin Other (See Comments)    Elevated blood sugar Elevated blood sugar  . Omeprazole Nausea And Vomiting  . Bupropion Nausea Only    Review of Systems  Constitutional: Positive for activity change. Negative for appetite change and unexpected weight change.  HENT: Positive for dental problem and hearing loss.   Respiratory: Positive for cough, shortness of breath and wheezing.   Gastrointestinal: Positive for abdominal pain (Heartburn) and constipation.  Genitourinary: Negative for difficulty urinating and dysuria.  Musculoskeletal: Positive for back pain, gait problem and myalgias.  Neurological: Positive for dizziness and headaches. Negative for syncope.       Memory problems.  Chronic pain  Hematological: Bruises/bleeds easily.  Psychiatric/Behavioral: Positive for dysphoric mood.  All other systems reviewed and are negative.   BP 134/82 (BP Location: Left Arm, Patient Position: Sitting)   Pulse 77   Resp 20   Ht 5\' 8"  (1.727 m)   SpO2 95% Comment: RA  BMI 38.92 kg/m  Physical Exam Vitals reviewed.  Constitutional:      General: She is not in acute distress.    Appearance: She is obese.  HENT:      Head: Normocephalic and atraumatic.  Eyes:     General: No scleral icterus.    Extraocular Movements: Extraocular movements intact.  Cardiovascular:     Rate and Rhythm: Normal rate and regular rhythm.     Heart sounds: Normal heart sounds. No murmur heard. No friction rub. No gallop.   Pulmonary:     Effort: Pulmonary effort is normal. No respiratory distress.     Breath sounds: No wheezing or rales.  Abdominal:     General: There is no distension.     Palpations: Abdomen is soft.     Tenderness: There is no abdominal tenderness.  Musculoskeletal:     Cervical back: No rigidity.     Right lower leg: No edema.     Left lower leg: No edema.  Lymphadenopathy:     Cervical: No cervical adenopathy.  Skin:    General: Skin is warm and dry.  Neurological:     General: No focal deficit present.     Mental Status: She is oriented to person, place, and time.     Cranial Nerves: No cranial nerve deficit.     Motor: No weakness.    Diagnostic Tests: CT CHEST WITHOUT CONTRAST FOR LUNG CANCER SCREENING NODULE FOLLOW-UP  TECHNIQUE: Multidetector CT imaging of the chest was performed following the standard protocol without IV contrast.  COMPARISON:  Low-dose lung cancer screening chest CT 07/30/2020.  FINDINGS: Cardiovascular: Heart size is normal. There is no significant pericardial fluid, thickening or pericardial  calcification. There is aortic atherosclerosis, as well as atherosclerosis of the great vessels of the mediastinum and the coronary arteries, including calcified atherosclerotic plaque in the left anterior descending, left circumflex and right coronary arteries.  Mediastinum/Nodes: No pathologically enlarged mediastinal or hilar lymph nodes. Please note that accurate exclusion of hilar adenopathy is limited on noncontrast CT scans. Esophagus is unremarkable in appearance. No axillary lymphadenopathy.  Lungs/Pleura: Multiple small pulmonary nodules are again  noted throughout the lungs bilaterally. Many of these are similar in size and number to the prior examination. The nodule of concern on the prior study in the posterior aspect of the left lower lobe is relatively similar in size compared to the prior examination (axial image 180 of series 3), with a volume derived mean diameter of 9.6 mm. However, this nodule is again associated with a cystic region which demonstrates increasingly thickened walls and peripheral nodularity, most notable for an area along the posteromedial aspect of the wall on today's examination (axial image 180 of series 3), with a volume derived mean diameter of 8.8 mm. Overall, the entirety of this lesion remains highly concerning for primary bronchogenic adenocarcinoma. No acute consolidative airspace disease. No pleural effusions. Mild diffuse bronchial wall thickening with very mild centrilobular and paraseptal emphysema.  Upper Abdomen: Aortic atherosclerosis. Aortic stent graft incompletely imaged.  Musculoskeletal: There are no aggressive appearing lytic or blastic lesions noted in the visualized portions of the skeleton.  IMPRESSION: 1. Increasing prominence of a large irregular thick-walled cystic lesion with multiple areas of nodularity in the left lower lobe, highly concerning for primary bronchogenic adenocarcinoma, categorized as Lung-RADS 4BS, suspicious. Additional imaging evaluation or consultation with Pulmonology or Thoracic Surgery recommended. 2. The "S" modifier above refers to potentially clinically significant non lung cancer related findings. Specifically, there is aortic atherosclerosis, in addition to 3 vessel coronary artery disease. Please note that although the presence of coronary artery calcium documents the presence of coronary artery disease, the severity of this disease and any potential stenosis cannot be assessed on this non-gated CT examination. Assessment for  potential risk factor modification, dietary therapy or pharmacologic therapy may be warranted, if clinically indicated. 3. Mild diffuse bronchial wall thickening with very mild centrilobular and paraseptal emphysema; imaging findings suggestive of underlying COPD.  These results will be called to the ordering clinician or representative by the Radiologist Assistant, and communication documented in the PACS or [employer].  Aortic Atherosclerosis (ICD10-I70.0) and Emphysema (ICD10-J43.9).   Electronically Signed   By: Vinnie Langton M.D.   On: 02/11/2021 11:38 I personally reviewed the CT images.  There is a cystic lesion in the posterior left lower lobe with a complex variable wall thickness and a 9 mm solid nodule.  Pulmonary function testing 02/28/2021 FVC 2.57 (68%) FEV1 1.82 (63%) FEV1 1.97 (68%) postbronchodilator DLCO 16.28 (70%)  Impression: Laura Nielsen is a 65 year old woman with a history of tobacco abuse, lung nodules, obesity, CAD, hypertension, hyperlipidemia, asthma, reflux, type 2 diabetes, sleep apnea requiring CPAP, osteoporosis, depression, and chronic pain.  She has a greater than 50-pack-year history of smoking and continues to smoke although she has cut back considerably in the past couple of weeks.  She has had multiple lung nodules followed on low-dose CTs for lung cancer screening.  There was a cystic lesion in the midportion of the left lower lobe posteriorly that has grown in size and also wall thickness over time.  There is a 9 mm solid nodular component associated with this.  She  had a pad that area about 6 months ago, which did not show any activity.  However given the nature of the lesion that does not rule out the possibility of cancer.  The differential diagnosis includes infectious and inflammatory nodules.  However, this is most likely a low-grade adenocarcinoma.  We discussed potential options for diagnosis and treatment.  This is not  favorable for CT-guided biopsy.  I think a bronchoscopic biopsy is is basically a coin flip as to whether he will get a decent sample or not.  If she would consider surgical resection for treatment then we should just go ahead and take the lesion out for definitive diagnosis and treatment at the same setting.  Plan would be to do a wedge resection followed by a lobectomy if the frozen section showed cancer.  She does have adequate pulmonary function to tolerate a lobectomy.  I described the potential surgical resection to her and her son.  We would plan a robotic left VATS for wedge resection and possible left lower lobectomy.  We will base a decision on whether to do a lobectomy on the intraoperative frozen section.  They understand the general nature of the procedure including the need for general anesthesia, the incisions to be used, the use of a drainage tube postoperatively, the expected hospital stay, and the overall recovery.  I informed them of the indications, risks, benefits, and alternatives.  They understand the risks include, but are not limited to death, MI, DVT, PE, bleeding, possible need for transfusion, infection, cardiac arrhythmias, stroke, as well as the possibility of other unforeseeable complications.  She would be a high risk patient due to her comorbidities including CAD, thoracic aortic atherosclerosis, obesity, ongoing tobacco abuse, COPD, but the risk is not prohibitive.  CAD-had a stent many years ago.  Still on Plavix.  She would need cardiac clearance prior to surgery due to her limited physical activity.  She is followed by Dr. Bartholome Bill in [place].  She has an appoint with him in May asked her to see if she can get that moved up and will check with his office as well.  Tobacco abuse-emphasized the importance of quitting smoking for multiple reasons, including pulmonary and cardiovascular disease.  Dizziness-history is not entirely clear but she did mention that it  sometimes happens when she is washing dishes.  She has severe athero-disease at the ostium of her left subclavian, so it is possible she has subclavian steal.  I asked her to pay attention to whether or not she is using her left arm when these episodes occur.  Plan:  She wishes to think over her options before making decision.  If she does proceed with surgery she needs cardiac clearance with Dr. Ubaldo Glassing.  She does have an appointment with him in May but we will see if that can be moved up.  She will call us to let us know she would like to schedule surgery or would like to come back to the office for further discussion after she has seen Dr. Tonna Corner, MD [facility] [phone]

## 2021-03-08 ENCOUNTER — Ambulatory Visit: Payer: Medicare Other

## 2021-03-08 ENCOUNTER — Ambulatory Visit: Payer: Medicare Other | Admitting: Obstetrics & Gynecology

## 2021-03-10 ENCOUNTER — Ambulatory Visit (INDEPENDENT_AMBULATORY_CARE_PROVIDER_SITE_OTHER): Payer: Medicare Other | Admitting: Gastroenterology

## 2021-03-10 ENCOUNTER — Other Ambulatory Visit: Payer: Self-pay

## 2021-03-10 ENCOUNTER — Encounter: Payer: Self-pay | Admitting: Gastroenterology

## 2021-03-10 VITALS — BP 141/80 | HR 76 | Temp 97.4°F | Ht 68.0 in | Wt 251.0 lb

## 2021-03-10 DIAGNOSIS — Z8619 Personal history of other infectious and parasitic diseases: Secondary | ICD-10-CM

## 2021-03-10 DIAGNOSIS — K2270 Barrett's esophagus without dysplasia: Secondary | ICD-10-CM

## 2021-03-10 MED ORDER — PANTOPRAZOLE SODIUM 20 MG PO TBEC: 20.0000 mg | DELAYED_RELEASE_TABLET | Freq: Every day | ORAL | 1 refills | Status: DC

## 2021-03-10 NOTE — Patient Instructions (Signed)
Barrett's Esophagus  Barrett's esophagus occurs when the tissue that lines the esophagus changes or becomes damaged. The esophagus is the tube that carries food from the throat to the stomach. With Barrett's esophagus, the cells that line the esophagus are replaced by cells that are similar to the lining of the intestines (intestinal metaplasia). Barrett's esophagus itself may not cause any symptoms. However, many people who have Barrett's esophagus also have gastroesophageal reflux disease (GERD), which may cause symptoms such as heartburn. Over time, a few people with this condition may develop cancer of the esophagus. Treatment may include medicines, procedures to destroy the abnormal cells, or surgery. What are the causes? The exact cause of this condition is not known. In some cases, the condition develops from damage to the lining of the esophagus caused by gastroesophageal reflux disease (GERD). GERD occurs when stomach acids flow up from the stomach into the esophagus. Frequent symptoms of GERD may cause intestinal metaplasia or cause cell changes (dysplasia). What increases the risk? You are more likely to develop this condition if you:  Have GERD.  Are female.  Are of European descent.  Are obese.  Are older than 50.  Have a hiatal hernia. This is a condition in which part of your stomach bulges into your chest.  Smoke. What are the signs or symptoms? People with Barrett's esophagus often have no symptoms. However, many people with this condition also have GERD. Symptoms of GERD may include:  Heartburn.  Difficulty swallowing.  Dry cough. How is this diagnosed? This condition may be diagnosed based on:  Results of an upper gastrointestinal endoscopy. For this exam, a thin, flexible tube with a light and a camera on the end (endoscope) is passed down your esophagus. Your health care provider can view the inside of your esophagus during this procedure.  Results of a biopsy.  For this procedure, several tissue samples are removed (biopsy) from your esophagus to look at under a microscope. They are then checked for intestinal metaplasia or dysplasia. How is this treated? Treatment for this condition may include:  Medicines (proton pump inhibitors, or PPIs) to decrease or stop GERD.  Periodic endoscopic exams to make sure that cancer is not developing.  A procedure or surgery for dysplasia. This may include: ? Removal or destruction of abnormal cells. ? Removal of part of the esophagus. Follow these instructions at home: Eating and drinking  Eat more fruits and vegetables.  Avoid fatty foods.  Eat small, frequent meals instead of large meals.  Avoid foods that cause heartburn. These foods include: ? Coffee and alcoholic drinks. ? Tomatoes and foods made with tomatoes. ? Greasy or spicy foods. ? Chocolate and peppermint.  Do not drink alcohol. General instructions  Take over-the-counter and prescription medicines only as told by your health care provider.  Do not use any products that contain nicotine or tobacco, such as cigarettes, e-cigarettes, and chewing tobacco. If you need help quitting, ask your health care provider.  If you are being treated for GERD, make sure you take medicines and follow all instructions as told by your health care provider.  Keep all follow-up visits as told by your health care provider. This is important. Contact a health care provider if:  You have heartburn or GERD symptoms.  You have difficulty swallowing. Get help right away if:  You have chest pain.  You are unable to swallow.  You vomit blood or material that looks like coffee grounds.  Your stool (feces) is bright red  or dark. These symptoms may represent a serious problem that is an emergency. Do not wait to see if the symptoms will go away. Get medical help right away. Call your local emergency services (911 in the [place] Do not drive yourself to the  hospital. Summary  Barrett's esophagus occurs when the tissue that lines the esophagus changes or becomes damaged.  Barrett's esophagus may be diagnosed with an upper gastrointestinal endoscopy and a biopsy.  Treatment may include medicines, procedures to remove abnormal cells, or surgery.  Follow your health care provider's instructions about what to eat and drink, what medicines to take, and when to call for help. This information is not intended to replace advice given to you by your health care provider. Make sure you discuss any questions you have with your health care provider. Document Revised: 02/07/2020 Document Reviewed: 02/07/2020 Elsevier Patient Education  [place].

## 2021-03-10 NOTE — Progress Notes (Signed)
Laura Antigua, MD [address]  [place]  [place], [place] [address]  Main: [phone]  Fax: [phone]   Primary Care Physician: Birdie Sons, MD   Chief Complaint  Patient presents with  . History of H Pylori    HPI: Laura Nielsen is a 65 y.o. female patient completed triple therapy for H. pylori.  Denies any abdominal pain.  No nausea or vomiting.  Good appetite.  States when she was in omeprazole during her H. pylori therapy, her reflux was well controlled.  Denies any dysphagia at this time.  Current Outpatient Medications  Medication Sig Dispense Refill  . albuterol (PROVENTIL HFA) 108 (90 Base) MCG/ACT inhaler Inhale 2 puffs into the lungs every 6 (six) hours as needed for wheezing or shortness of breath. 18 g 11  . allopurinol (ZYLOPRIM) 100 MG tablet Take 1 tablet (100 mg total) by mouth daily. 90 tablet 0  . aspirin 81 MG tablet Take 81 mg by mouth daily.     . bisoprolol (ZEBETA) 10 MG tablet Take 1 tablet (10 mg total) by mouth daily. 90 tablet 0  . Cholecalciferol 25 MCG (1000 UT) capsule Take 1,000 Units by mouth daily.    . clopidogrel (PLAVIX) 75 MG tablet Take 1 tablet (75 mg total) by mouth daily. 90 tablet 0  . cyanocobalamin 1000 MCG tablet Take 1,000 mcg by mouth daily.    . Ibuprofen 200 MG CAPS Take by mouth as needed.    Marland Kitchen lisinopril-hydrochlorothiazide (ZESTORETIC) 20-12.5 MG tablet Take 1 tablet by mouth daily. 90 tablet 0  . pantoprazole (PROTONIX) 20 MG tablet Take 1 tablet (20 mg total) by mouth daily. 90 tablet 1  . rosuvastatin (CRESTOR) 20 MG tablet Take 1 tablet (20 mg total) by mouth daily. 90 tablet 0  . Semaglutide, 1 MG/DOSE, (OZEMPIC, 1 MG/DOSE,) 2 MG/1.5ML SOPN Inject 1 mg into the skin once a week. 2 pen 11  . gabapentin (NEURONTIN) 300 MG capsule Take 1 capsule (300 mg total) by mouth 2 (two) times daily AND 2 capsules (600 mg total) at bedtime. Take 1 capsule (300 mg total) by mouth 3 (three) times daily.. (Patient taking  differently: Take 1 capsule (300 mg total) by mouth 2 (two) times daily AND 2 capsules (600 mg total) at bedtime.) 120 capsule 5  . loratadine (CLARITIN) 10 MG tablet Take 10 mg by mouth daily as needed. (Patient not taking: Reported on 03/10/2021)    . oxyCODONE (ROXICODONE) 5 MG immediate release tablet Take 1 tablet (5 mg total) by mouth daily as needed for severe pain. (Patient not taking: Reported on 03/10/2021) 15 tablet 0   No current facility-administered medications for this visit.    Allergies as of 03/10/2021 - Review Complete 03/10/2021  Allergen Reaction Noted  . Atorvastatin Other (See Comments) 09/25/2016  . Omeprazole Nausea And Vomiting 07/28/2015  . Bupropion Nausea Only 02/12/2019    ROS:  General: Negative for anorexia, weight loss, fever, chills, fatigue, weakness. ENT: Negative for hoarseness, difficulty swallowing , nasal congestion. CV: Negative for chest pain, angina, palpitations, dyspnea on exertion, peripheral edema.  Respiratory: Negative for dyspnea at rest, dyspnea on exertion, cough, sputum, wheezing.  GI: See history of present illness. GU:  Negative for dysuria, hematuria, urinary incontinence, urinary frequency, nocturnal urination.  Endo: Negative for unusual weight change.    Physical Examination:   BP (!) 141/80   Pulse 76   Temp (!) 97.4 F (36.3 C) (Oral)   Ht 5\' 8"  (  1.727 m)   Wt 251 lb (113.9 kg)   BMI 38.16 kg/m   General: Well-nourished, well-developed in no acute distress.  Eyes: No icterus. Conjunctivae pink. Mouth: Oropharyngeal mucosa moist and pink , no lesions erythema or exudate. Neck: Supple, Trachea midline Abdomen: Bowel sounds are normal, nontender, nondistended, no hepatosplenomegaly or masses, no abdominal bruits or hernia , no rebound or guarding.   Extremities: No lower extremity edema. No clubbing or deformities. Neuro: Alert and oriented x 3.  Grossly intact. Skin: Warm and dry, no jaundice.   Psych: Alert and  cooperative, normal mood and affect.   Labs: CMP     Component Value Date/Time   NA 141 01/25/2021 1048   NA 139 03/28/2013 0354   K 3.4 (L) 01/25/2021 1048   K 3.7 03/28/2013 1811   CL 100 01/25/2021 1048   CL 107 03/28/2013 0354   CO2 22 01/25/2021 1048   CO2 27 03/28/2013 0354   GLUCOSE 136 (H) 01/25/2021 1048   GLUCOSE 117 (H) 08/30/2017 0920   GLUCOSE 112 (H) 03/28/2013 0354   BUN 16 01/25/2021 1048   BUN 8 03/28/2013 0354   CREATININE 0.78 01/25/2021 1048   CREATININE 0.60 08/30/2017 0920   CALCIUM 10.0 01/25/2021 1048   CALCIUM 8.2 (L) 03/28/2013 0354   PROT 6.8 01/25/2021 1048   PROT 6.0 (L) 03/28/2013 0354   ALBUMIN 4.3 01/25/2021 1048   ALBUMIN 3.1 (L) 03/28/2013 0354   AST 24 01/25/2021 1048   AST 16 03/28/2013 0354   ALT 25 01/25/2021 1048   ALT 19 03/28/2013 0354   ALKPHOS 115 01/25/2021 1048   ALKPHOS 77 03/28/2013 0354   BILITOT 0.7 01/25/2021 1048   BILITOT 0.6 03/28/2013 0354   GFRNONAA 81 01/25/2021 1048   GFRNONAA 99 08/30/2017 0920   GFRAA 93 01/25/2021 1048   GFRAA 115 08/30/2017 0920   Lab Results  Component Value Date   WBC 11.5 (H) 01/25/2021   HGB 16.3 (H) 01/25/2021   HCT 49.0 (H) 01/25/2021   MCV 86 01/25/2021   PLT 227 01/25/2021    Imaging Studies: MM 3D SCREEN BREAST BILATERAL  Result Date: 03/03/2021 CLINICAL DATA:  Screening. EXAM: DIGITAL SCREENING BILATERAL MAMMOGRAM WITH TOMOSYNTHESIS AND CAD TECHNIQUE: Bilateral screening digital craniocaudal and mediolateral oblique mammograms were obtained. Bilateral screening digital breast tomosynthesis was performed. The images were evaluated with computer-aided detection. COMPARISON:  Previous exam(s). ACR Breast Density Category b: There are scattered areas of fibroglandular density. FINDINGS: There are no findings suspicious for malignancy. The images were evaluated with computer-aided detection. IMPRESSION: No mammographic evidence of malignancy. A result letter of this screening  mammogram will be mailed directly to the patient. RECOMMENDATION: Screening mammogram in one year. (Code:[id number]) BI-RADS CATEGORY  1: Negative. Electronically Signed   By: Lillia Mountain M.D.   On: 03/03/2021 11:55   CT CHEST LCS NODULE F/U W/O CONTRAST  Result Date: 02/11/2021 CLINICAL DATA:  65 year old female current smoker with 38 pack-year history of smoking. Follow-up for prior abnormal low-dose lung cancer screening examination. EXAM: CT CHEST WITHOUT CONTRAST FOR LUNG CANCER SCREENING NODULE FOLLOW-UP TECHNIQUE: Multidetector CT imaging of the chest was performed following the standard protocol without IV contrast. COMPARISON:  Low-dose lung cancer screening chest CT 07/30/2020. FINDINGS: Cardiovascular: Heart size is normal. There is no significant pericardial fluid, thickening or pericardial calcification. There is aortic atherosclerosis, as well as atherosclerosis of the great vessels of the mediastinum and the coronary arteries, including calcified atherosclerotic plaque in the left anterior descending,  left circumflex and right coronary arteries. Mediastinum/Nodes: No pathologically enlarged mediastinal or hilar lymph nodes. Please note that accurate exclusion of hilar adenopathy is limited on noncontrast CT scans. Esophagus is unremarkable in appearance. No axillary lymphadenopathy. Lungs/Pleura: Multiple small pulmonary nodules are again noted throughout the lungs bilaterally. Many of these are similar in size and number to the prior examination. The nodule of concern on the prior study in the posterior aspect of the left lower lobe is relatively similar in size compared to the prior examination (axial image 180 of series 3), with a volume derived mean diameter of 9.6 mm. However, this nodule is again associated with a cystic region which demonstrates increasingly thickened walls and peripheral nodularity, most notable for an area along the posteromedial aspect of the wall on today's examination  (axial image 180 of series 3), with a volume derived mean diameter of 8.8 mm. Overall, the entirety of this lesion remains highly concerning for primary bronchogenic adenocarcinoma. No acute consolidative airspace disease. No pleural effusions. Mild diffuse bronchial wall thickening with very mild centrilobular and paraseptal emphysema. Upper Abdomen: Aortic atherosclerosis. Aortic stent graft incompletely imaged. Musculoskeletal: There are no aggressive appearing lytic or blastic lesions noted in the visualized portions of the skeleton. IMPRESSION: 1. Increasing prominence of a large irregular thick-walled cystic lesion with multiple areas of nodularity in the left lower lobe, highly concerning for primary bronchogenic adenocarcinoma, categorized as Lung-RADS 4BS, suspicious. Additional imaging evaluation or consultation with Pulmonology or Thoracic Surgery recommended. 2. The "S" modifier above refers to potentially clinically significant non lung cancer related findings. Specifically, there is aortic atherosclerosis, in addition to 3 vessel coronary artery disease. Please note that although the presence of coronary artery calcium documents the presence of coronary artery disease, the severity of this disease and any potential stenosis cannot be assessed on this non-gated CT examination. Assessment for potential risk factor modification, dietary therapy or pharmacologic therapy may be warranted, if clinically indicated. 3. Mild diffuse bronchial wall thickening with very mild centrilobular and paraseptal emphysema; imaging findings suggestive of underlying COPD. These results will be called to the ordering clinician or representative by the Radiologist Assistant, and communication documented in the PACS or [employer]. Aortic Atherosclerosis (ICD10-I70.0) and Emphysema (ICD10-J43.9). Electronically Signed   By: Vinnie Langton M.D.   On: 02/11/2021 11:38   Pulmonary Function Test [facility] Only  Result Date:  02/28/2021 Spirometry Data Is Acceptable and Reproducible No obvious evidence of Obstructive Airways disease or Restrictive Lung disease Consider outpatient follow up with Pulmonary if needed. Clinical Correlation Advised    Assessment and Plan:   Laura Nielsen is a 65 y.o. y/o female here for follow-up of H. pylori and Barrett's esophagus  H. pylori eradication testing to be done with breath test today Patient has been off PPI for over 2 weeks  Restart low-dose PPI today as per guidelines for Barrett's esophagus Patient educated extensively on acid reflux lifestyle modification, including buying a bed wedge, not eating 3 hrs before bedtime, diet modifications, and handout given for the same.   Patient also agreeable for repeat Barrett's surveillance in 3 years after discussing options of continued surveillance, versus conservative management in detail.  We will set recall  She will also need repeat colonoscopy in 6 months due to fair prep on last exam    Dr Laura Nielsen

## 2021-03-12 LAB — H. PYLORI BREATH TEST: H pylori Breath Test: NEGATIVE

## 2021-03-15 DIAGNOSIS — K319 Disease of stomach and duodenum, unspecified: Secondary | ICD-10-CM

## 2021-03-15 DIAGNOSIS — I2510 Atherosclerotic heart disease of native coronary artery without angina pectoris: Secondary | ICD-10-CM | POA: Diagnosis not present

## 2021-03-15 DIAGNOSIS — K635 Polyp of colon: Secondary | ICD-10-CM

## 2021-03-15 DIAGNOSIS — J439 Emphysema, unspecified: Secondary | ICD-10-CM | POA: Diagnosis not present

## 2021-03-15 DIAGNOSIS — I10 Essential (primary) hypertension: Secondary | ICD-10-CM | POA: Diagnosis not present

## 2021-03-15 DIAGNOSIS — K2289 Other specified disease of esophagus: Secondary | ICD-10-CM

## 2021-03-15 DIAGNOSIS — R1310 Dysphagia, unspecified: Secondary | ICD-10-CM

## 2021-03-15 DIAGNOSIS — I714 Abdominal aortic aneurysm, without rupture: Secondary | ICD-10-CM | POA: Diagnosis not present

## 2021-03-16 DIAGNOSIS — I2510 Atherosclerotic heart disease of native coronary artery without angina pectoris: Secondary | ICD-10-CM | POA: Diagnosis not present

## 2021-03-17 ENCOUNTER — Encounter: Payer: Self-pay | Admitting: *Deleted

## 2021-03-17 ENCOUNTER — Other Ambulatory Visit: Payer: Self-pay | Admitting: *Deleted

## 2021-03-22 ENCOUNTER — Telehealth: Payer: Self-pay | Admitting: *Deleted

## 2021-03-22 ENCOUNTER — Telehealth: Payer: Self-pay | Admitting: Family Medicine

## 2021-03-22 NOTE — Telephone Encounter (Signed)
Laura Nielsen called requesting transportation services from her COVID testing appt at [facility] to [facility] for pre admission testing on May 2nd. Transportation set up for patient to be picked up from the [facility] building in [place] for drop off at [facility]. Patient provided number to transportation service for pick up [phone].

## 2021-03-22 NOTE — Telephone Encounter (Signed)
   Tonae Livolsi DOB: May 19, 1956 MRN: [id number]   RIDER WAIVER AND RELEASE OF LIABILITY  For purposes of improving physical access to our facilities, [facility] is pleased to partner with third parties to provide [facility] patients or other authorized individuals the option of convenient, on-demand ground transportation services (the [place]") through use of the technology service that enables users to request on-demand ground transportation from independent third-party providers.  By opting to use and accept these [employer], I, the undersigned, hereby agree on behalf of myself, and on behalf of any minor child using the [employer] for whom I am the parent or legal guardian, as follows:  1. Government social research officer provided to me are provided by independent third-party transportation providers who are not [employer] or employees and who are unaffiliated with [employer]. 2. [facility] is neither a transportation carrier nor a common or public carrier. 3. [facility] has no control over the quality or safety of the transportation that occurs as a result of the [employer]. 4. [facility] cannot guarantee that any third-party transportation provider will complete any arranged transportation service. 5. [facility] makes no representation, warranty, or guarantee regarding the reliability, timeliness, quality, safety, suitability, or availability of any of the Transport Services or that they will be error free. 6. I fully understand that traveling by vehicle involves risks and dangers of serious bodily injury, including permanent disability, paralysis, and death. I agree, on behalf of myself and on behalf of any minor child using the Transport Services for whom I am the parent or legal guardian, that the entire risk arising out of my use of the [employer] remains solely with me, to the maximum extent permitted under applicable law. 7. The [employer]  are provided "as is" and "as available." The [facility] disclaims all representations and warranties, express, implied or statutory, not expressly set out in these terms, including the implied warranties of merchantability and fitness for a particular purpose. 8. I hereby waive and release [facility], its agents, employees, officers, directors, representatives, insurers, attorneys, assigns, successors, subsidiaries, and affiliates from any and all past, present, or future claims, demands, liabilities, actions, causes of action, or suits of any kind directly or indirectly arising from acceptance and use of the [employer]. 9. I further waive and release [facility] and its affiliates from all present and future liability and responsibility for any injury or death to persons or damages to property caused by or related to the use of the [employer]. 10. I have read this Waiver and Release of Liability, and I understand the terms used in it and their legal significance. This Waiver is freely and voluntarily given with the understanding that my right (as well as the right of any minor child for whom I am the parent or legal guardian using the [employer]) to legal recourse against  in connection with the [employer] is knowingly surrendered in return for use of these services.   I attest that I read the consent document to [name], gave Ms. Monje the opportunity to ask questions and answered the questions asked (if any). I affirm that Braelin Brosch then provided consent for she's participation in this program.     Drucie Ip

## 2021-04-01 ENCOUNTER — Other Ambulatory Visit: Payer: Self-pay | Admitting: Family Medicine

## 2021-04-01 DIAGNOSIS — I10 Essential (primary) hypertension: Secondary | ICD-10-CM

## 2021-04-01 NOTE — Progress Notes (Signed)
Surgical Instructions    Your procedure is scheduled on Wednesday, May 4th, 2022  Report to [facility] Main Entrance "A" at 10:30 A.M., then check in with the Admitting office.  Call this number if you have problems the morning of surgery:  [phone]   If you have any questions prior to your surgery date call [phone]: Open Monday-Friday 8am-4pm    Remember:  Do not eat or drink after midnight the night before your surgery   Take these medicines the morning of surgery with A SIP OF WATER   allopurinol (ZYLOPRIM) bisoprolol (ZEBETA) gabapentin (NEURONTIN)  pantoprazole (PROTONIX) rosuvastatin (CRESTOR)   If needed  albuterol (PROVENTIL HFA) oxycodone (OXY-IR)   WHAT DO I DO ABOUT MY DIABETES MEDICATION?   Marland Kitchen Do not take Semaglutide (OZEMPIC) the day of surgery  HOW TO MANAGE YOUR DIABETES BEFORE AND AFTER SURGERY  Why is it important to control my blood sugar before and after surgery? . Improving blood sugar levels before and after surgery helps healing and can limit problems. . A way of improving blood sugar control is eating a healthy diet by: o  Eating less sugar and carbohydrates o  Increasing activity/exercise o  Talking with your doctor about reaching your blood sugar goals . High blood sugars (greater than 180 mg/dL) can raise your risk of infections and slow your recovery, so you will need to focus on controlling your diabetes during the weeks before surgery. . Make sure that the doctor who takes care of your diabetes knows about your planned surgery including the date and location.  How do I manage my blood sugar before surgery? . Check your blood sugar at least 4 times a day, starting 2 days before surgery, to make sure that the level is not too high or low. o Check your blood sugar the morning of your surgery when you wake up and every 2 hours until you get to the Short Stay unit. . If your blood sugar is less than 70 mg/dL, you will need to treat for  low blood sugar: o Do not take insulin. o Treat a low blood sugar (less than 70 mg/dL) with  cup of clear juice (cranberry or apple), 4 glucose tablets, OR glucose gel. Recheck blood sugar in 15 minutes after treatment (to make sure it is greater than 70 mg/dL). If your blood sugar is not greater than 70 mg/dL on recheck, call PAT number. o  for further instructions. . Report your blood sugar to the short stay nurse when you get to Short Stay.  . If you are admitted to the hospital after surgery: o Your blood sugar will be checked by the staff and you will probably be given insulin after surgery (instead of oral diabetes medicines) to make sure you have good blood sugar levels. o The goal for blood sugar control after surgery is 80-180 mg/dL.   Follow your surgeon's instructions on when to stop Aspirin and Plavix.  If no instructions were given by your surgeon then you will need to call the office to get those instructions.    As of today, STOP taking Aleve, Naproxen, Ibuprofen, Motrin, Advil, Goody's, BC's, all herbal medications, fish oil, and all vitamins.                     Do not wear jewelry, make up, or nail polish            Do not wear lotions, powders, perfumes, or deodorant.  Do not shave 48 hours prior to surgery.              Do not bring valuables to the hospital.            [facility] is not responsible for any belongings or valuables.  Do NOT Smoke (Tobacco/Vaping) or drink Alcohol 24 hours prior to your procedure If you use a CPAP at night, you may bring all equipment for your overnight stay.   Contacts, glasses, dentures or bridgework may not be worn into surgery, please bring cases for these belongings   For patients admitted to the hospital, discharge time will be determined by your treatment team.   Patients discharged the day of surgery will not be allowed to drive home, and someone needs to stay with them for 24 hours.    Special instructions:    [facility]- Preparing For Surgery  Before surgery, you can play an important role. Because skin is not sterile, your skin needs to be as free of germs as possible. You can reduce the number of germs on your skin by washing with CHG (chlorahexidine gluconate) Soap before surgery.  CHG is an antiseptic cleaner which kills germs and bonds with the skin to continue killing germs even after washing.    Oral Hygiene is also important to reduce your risk of infection.  Remember - BRUSH YOUR TEETH THE MORNING OF SURGERY WITH YOUR REGULAR TOOTHPASTE  Please do not use if you have an allergy to CHG or antibacterial soaps. If your skin becomes reddened/irritated stop using the CHG.  Do not shave (including legs and underarms) for at least 48 hours prior to first CHG shower. It is OK to shave your face.  Please follow these instructions carefully.   1. Shower the NIGHT BEFORE SURGERY and the MORNING OF SURGERY  2. If you chose to wash your hair, wash your hair first as usual with your normal shampoo.  3. After you shampoo, rinse your hair and body thoroughly to remove the shampoo.  4. Wash Face and genitals (private parts) with your normal soap.   5.  Shower the NIGHT BEFORE SURGERY and the MORNING OF SURGERY with CHG Soap.   6. Use CHG Soap as you would any other liquid soap. You can apply CHG directly to the skin and wash gently with a scrungie or a clean washcloth.   7. Apply the CHG Soap to your body ONLY FROM THE NECK DOWN.  Do not use on open wounds or open sores. Avoid contact with your eyes, ears, mouth and genitals (private parts). Wash Face and genitals (private parts)  with your normal soap.   8. Wash thoroughly, paying special attention to the area where your surgery will be performed.  9. Thoroughly rinse your body with warm water from the neck down.  10. DO NOT shower/wash with your normal soap after using and rinsing off the CHG Soap.  11. Pat yourself dry with a CLEAN  TOWEL.  12. Wear CLEAN PAJAMAS to bed the night before surgery  13. Place CLEAN SHEETS on your bed the night before your surgery  14. DO NOT SLEEP WITH PETS.   Day of Surgery: Take a shower with CHG soap. Wear Clean/Comfortable clothing the morning of surgery Do not apply any deodorants/lotions.   Remember to brush your teeth WITH YOUR REGULAR TOOTHPASTE.   Please read over the following fact sheets that you were given.

## 2021-04-04 ENCOUNTER — Other Ambulatory Visit
Admission: RE | Admit: 2021-04-04 | Discharge: 2021-04-04 | Disposition: A | Discharge status: Home / Self Care | Payer: Medicare Other | Source: Ambulatory Visit | Attending: Thoracic Surgery (Cardiothoracic Vascular Surgery) | Admitting: Thoracic Surgery (Cardiothoracic Vascular Surgery)

## 2021-04-04 ENCOUNTER — Ambulatory Visit (HOSPITAL_COMMUNITY)
Admission: RE | Admit: 2021-04-04 | Discharge: 2021-04-04 | Disposition: A | Discharge status: Home / Self Care | Payer: Medicare Other | Source: Ambulatory Visit | Attending: Thoracic Surgery (Cardiothoracic Vascular Surgery) | Admitting: Thoracic Surgery (Cardiothoracic Vascular Surgery)

## 2021-04-04 ENCOUNTER — Encounter (HOSPITAL_COMMUNITY): Payer: Self-pay

## 2021-04-04 ENCOUNTER — Encounter (HOSPITAL_COMMUNITY)
Admission: RE | Admit: 2021-04-04 | Discharge: 2021-04-04 | Disposition: A | Discharge status: Home / Self Care | Payer: Medicare Other | Source: Ambulatory Visit | Attending: Thoracic Surgery (Cardiothoracic Vascular Surgery) | Admitting: Thoracic Surgery (Cardiothoracic Vascular Surgery)

## 2021-04-04 ENCOUNTER — Other Ambulatory Visit: Payer: Self-pay

## 2021-04-04 DIAGNOSIS — Z20822 Contact with and (suspected) exposure to covid-19: Secondary | ICD-10-CM | POA: Diagnosis not present

## 2021-04-04 DIAGNOSIS — Z01812 Encounter for preprocedural laboratory examination: Secondary | ICD-10-CM | POA: Insufficient documentation

## 2021-04-04 DIAGNOSIS — Z01818 Encounter for other preprocedural examination: Secondary | ICD-10-CM | POA: Insufficient documentation

## 2021-04-04 HISTORY — DX: Atherosclerotic heart disease of native coronary artery without angina pectoris: I25.10

## 2021-04-04 HISTORY — DX: Personal history of urinary calculi: Z87.442

## 2021-04-04 HISTORY — DX: Abdominal aortic aneurysm, without rupture: I71.4

## 2021-04-04 HISTORY — DX: Other complications of anesthesia, initial encounter: T88.59XA

## 2021-04-04 HISTORY — DX: Dyspnea, unspecified: R06.00

## 2021-04-04 HISTORY — DX: Anxiety disorder, unspecified: F41.9

## 2021-04-04 HISTORY — DX: Abdominal aortic aneurysm, without rupture, unspecified: I71.40

## 2021-04-04 LAB — HEMOGLOBIN A1C
Hgb A1c MFr Bld: 5.9 % — ABNORMAL HIGH (ref 4.8–5.6)
Mean Plasma Glucose: 122.63 mg/dL

## 2021-04-04 LAB — CBC
HCT: 47.9 % — ABNORMAL HIGH (ref 36.0–46.0)
Hemoglobin: 16.1 g/dL — ABNORMAL HIGH (ref 12.0–15.0)
MCH: 28.7 pg (ref 26.0–34.0)
MCHC: 33.6 g/dL (ref 30.0–36.0)
MCV: 85.4 fL (ref 80.0–100.0)
Platelets: 246 10*3/uL (ref 150–400)
RBC: 5.61 MIL/uL — ABNORMAL HIGH (ref 3.87–5.11)
RDW: 13.8 % (ref 11.5–15.5)
WBC: 10.1 10*3/uL (ref 4.0–10.5)
nRBC: 0 % (ref 0.0–0.2)

## 2021-04-04 LAB — URINALYSIS, ROUTINE W REFLEX MICROSCOPIC
Bacteria, UA: NONE SEEN
Bilirubin Urine: NEGATIVE
Glucose, UA: NEGATIVE mg/dL
Hgb urine dipstick: NEGATIVE
Ketones, ur: NEGATIVE mg/dL
Leukocytes,Ua: NEGATIVE
Nitrite: NEGATIVE
Protein, ur: 30 mg/dL — AB
Specific Gravity, Urine: 1.021 (ref 1.005–1.030)
pH: 6 (ref 5.0–8.0)

## 2021-04-04 LAB — BLOOD GAS, ARTERIAL
Acid-Base Excess: 0.2 mmol/L (ref 0.0–2.0)
Bicarbonate: 23.9 mmol/L (ref 20.0–28.0)
Drawn by: 602861
FIO2: 21
O2 Saturation: 97.8 %
Patient temperature: 37
pCO2 arterial: 36.5 mmHg (ref 32.0–48.0)
pH, Arterial: 7.433 (ref 7.350–7.450)
pO2, Arterial: 104 mmHg (ref 83.0–108.0)

## 2021-04-04 LAB — COMPREHENSIVE METABOLIC PANEL
ALT: 28 U/L (ref 0–44)
AST: 34 U/L (ref 15–41)
Albumin: 3.9 g/dL (ref 3.5–5.0)
Alkaline Phosphatase: 82 U/L (ref 38–126)
Anion gap: 13 (ref 5–15)
BUN: 21 mg/dL (ref 8–23)
CO2: 21 mmol/L — ABNORMAL LOW (ref 22–32)
Calcium: 9.6 mg/dL (ref 8.9–10.3)
Chloride: 104 mmol/L (ref 98–111)
Creatinine, Ser: 0.69 mg/dL (ref 0.44–1.00)
GFR, Estimated: >60 mL/min (ref >60)
Glucose, Bld: 105 mg/dL — ABNORMAL HIGH (ref 70–99)
Potassium: 3.5 mmol/L (ref 3.5–5.1)
Sodium: 138 mmol/L (ref 135–145)
Total Bilirubin: 1 mg/dL (ref 0.3–1.2)
Total Protein: 7.1 g/dL (ref 6.5–8.1)

## 2021-04-04 LAB — GLUCOSE, CAPILLARY: Glucose-Capillary: 106 mg/dL — ABNORMAL HIGH (ref 70–99)

## 2021-04-04 LAB — PROTIME-INR
INR: 1 (ref 0.8–1.2)
Prothrombin Time: 12.8 seconds (ref 11.4–15.2)

## 2021-04-04 LAB — APTT: aPTT: 27 seconds (ref 24–36)

## 2021-04-04 LAB — SURGICAL PCR SCREEN
MRSA, PCR: NEGATIVE
Staphylococcus aureus: NEGATIVE

## 2021-04-04 NOTE — Progress Notes (Addendum)
PCP - Dr. Lelon Huh Cardiologist - Dr. Ubaldo Glassing, [place]  PPM/ICD - denies Device Orders - N/A Rep Notified - N/A  Chest x-ray - 04/04/2021 EKG - requested tracing from Dr. Bartholome Bill office Stress Test - 03/16/2021 ECHO - 10/22/17 Cardiac Cath - 2010  Sleep Study - yes CPAP - has order but the patient is not using now   Fasting Blood Sugar - patient is not checking CBG at home CBG today in PAT - 106 A1C - 04/04/2021 (was 6.2 on 01/25/2021)  Blood Thinner Instructions: Patient stopped Plavix on 04/01/2021 per MD order.   Aspirin Instructions: patient was instructed: As of today, STOP taking Aleve, Naproxen, Ibuprofen, Motrin, Advil, Goody's, BC's, all herbal medications, fish oil, and all vitamins.  Patient was instructed to follow her surgeon's instructions on when to stop Aspirin.  If no instructions were given by her surgeon then she will need to call the office to get those instructions.    ERAS Protcol - no  COVID TEST- 04/04/2021   Anesthesia review: yes; cardiac history; EKG tracing requested; BP dropped before after anesthesia  Patient denies shortness of breath, fever, cough and chest pain at PAT appointment   All instructions explained to the patient, with a verbal understanding of the material. Patient agrees to go over the instructions while at home for a better understanding. Patient also instructed to self quarantine after being tested for COVID-19. The opportunity to ask questions was provided.

## 2021-04-05 ENCOUNTER — Encounter (HOSPITAL_COMMUNITY): Payer: Self-pay

## 2021-04-05 LAB — SARS CORONAVIRUS 2 (TAT 6-24 HRS): SARS Coronavirus 2: NEGATIVE

## 2021-04-05 NOTE — Anesthesia Preprocedure Evaluation (Addendum)
Anesthesia Evaluation  Patient identified by MRN, date of birth, ID band Patient awake    Reviewed: Allergy & Precautions, NPO status , Patient's Chart, lab work & pertinent test results  Airway Mallampati: II  TM Distance: >3 FB Neck ROM: Full    Dental  (+) Teeth Intact, Dental Advisory Given, Loose,    Pulmonary Current Smoker and Patient abstained from smoking.,    breath sounds clear to auscultation       Cardiovascular hypertension,  Rhythm:Regular Rate:Normal     Neuro/Psych    GI/Hepatic   Endo/Other  diabetes  Renal/GU      Musculoskeletal   Abdominal   Peds  Hematology   Anesthesia Other Findings   Reproductive/Obstetrics                            Anesthesia Physical Anesthesia Plan  ASA: III  Anesthesia Plan: General   Post-op Pain Management:    Induction: Intravenous  PONV Risk Score and Plan: Ondansetron and Dexamethasone  Airway Management Planned: Double Lumen EBT  Additional Equipment: Arterial line  Intra-op Plan:   Post-operative Plan: Extubation in OR  Informed Consent: I have reviewed the patients History and Physical, chart, labs and discussed the procedure including the risks, benefits and alternatives for the proposed anesthesia with the patient or authorized representative who has indicated his/her understanding and acceptance.     Dental advisory given  Plan Discussed with: CRNA and Anesthesiologist  Anesthesia Plan Comments: (See PAT note written 04/05/2021 by Myra Gianotti, PA-C. )       Anesthesia Quick Evaluation

## 2021-04-05 NOTE — Progress Notes (Signed)
Anesthesia Chart Review:  Case: [id number] Date/Time: 04/06/21 1215   Procedure: Left XI ROBOTIC ASSISTED THORASCOPY-Left Lower Lobe WEDGE RESECTION, Possible LOWER LOBECTOMY (Left Chest)   Anesthesia type: General   Pre-op diagnosis: LLL lung nodule   Location: [facility] OR ROOM 10 / [place] [facility]   Surgeons: Melrose Nakayama, MD      DISCUSSION: Patient is a 65 year old female scheduled for the above procedure. She is a smoker with 07/30/20 chest CT for lung cancer screening showing LLL lung nodule which was not hypermetabolic on 6/0/73 PET scan. Follow-up chest CT 02/10/21 showed nodule had increased in size, so referred to CT surgery. PET did show a right adnexal cystic mass which is being followed by [place] with reported normal tumor markers.   Other history includes smoking, CAD (s/p DES RCA 2010), DM2, HLD, HTN, AAA (s/p EVAR AAA with Endologix endograft 03/27/13), emphysema, dyspnea, OSA (not currently using CPAP), anxiety. She reported hypotension after anesthesia. BMI is consistent with obesity.  Per Dr. Leonarda Salon notes, she does get some dizziness sometimes when washing dishes. He notes "She has severe athero-disease at the ostium of her left subclavian, so it is possible she has subclavian steal.  I asked her to pay attention to whether or not she is using her left arm when these episodes occur." She did have "Normal bilateral SCA flow hemodynamics on 05/06/20 carotid US."   Dr. Roxan Hockey requested preoperative cardiology evaluation. Last cardiology visit with Dr. Ubaldo Glassing on 03/15/21. Preoperative stress test ordered which was negative. No significant valvular disease by 2018 echo. Last Plavix 04/01/21.   Pfizer COVID-19 vaccine #3 11/30/20. 04/04/21 presurgical COVID-19 test negative. Anesthesia team to evaluate on the day of surgery. 04/04/21 CXR is still in process.   VS: BP (!) 114/98   Pulse 75   Temp 37.1 C (Oral)   Resp 18   Ht 5\' 8"  (1.727 m)   Wt 111.7 kg   SpO2 96%   BMI 37.45  kg/m    PROVIDERS: Birdie Sons, MD is PCP. Last visit 01/25/21. Bartholome Bill, MD is cardiologist [facility] [place]) - Delana Meyer, Belenda Cruise, MD is vascular surgeon. Last visit 05/06/20. Duplex showed patent graft with no endoleak and the sac is 2.8 cm (previous 2.2 cm). ABIs > 1.0.  1-39% BICA stenosis.  Jennings Books, MD is neurologist. Last visit 10/23/20 for peripheral neuropathy and benign essential tremor follow-up. One year follow-up recommended.   Milinda Pointer, MD is Pain Management - Teresa Coombs, MD is GYN Mellody Drown, MD is GYN-ONC. Last visit 09/22/20. Discussed 6 month follow-up with GYN with repeat CA-125 and surveillance [place] for complex ovarian cyst. - Vonda Antigua, MD is GI   LABS: Labs reviewed: Acceptable for surgery. (all labs ordered are listed, but only abnormal results are displayed)  Labs Reviewed  GLUCOSE, CAPILLARY - Abnormal; Notable for the following components:      Result Value   Glucose-Capillary 106 (*)    All other components within normal limits  CBC - Abnormal; Notable for the following components:   RBC 5.61 (*)    Hemoglobin 16.1 (*)    HCT 47.9 (*)    All other components within normal limits  COMPREHENSIVE METABOLIC PANEL - Abnormal; Notable for the following components:   CO2 21 (*)    Glucose, Bld 105 (*)    All other components within normal limits  URINALYSIS, ROUTINE W REFLEX MICROSCOPIC - Abnormal; Notable for the following  components:   APPearance HAZY (*)    Protein, ur 30 (*)    All other components within normal limits  HEMOGLOBIN A1C - Abnormal; Notable for the following components:   Hgb A1c MFr Bld 5.9 (*)    All other components within normal limits  SURGICAL PCR SCREEN  BLOOD GAS, ARTERIAL  PROTIME-INR  APTT  TYPE AND SCREEN    Spirometry 02/25/21: FVC 2.57 (68%), post 2.82 (74%). FEV1 1.82 (63%), post 1.97 (68%). FEV1/FVC 71% (92%).    IMAGES: CXR 04/04/21: In  process.  CT Chest 02/10/21: IMPRESSION: 1. Increasing prominence of a large irregular thick-walled cystic lesion with multiple areas of nodularity in the left lower lobe, highly concerning for primary bronchogenic adenocarcinoma, categorized as Lung-RADS 4BS, suspicious. Additional imaging evaluation or consultation with Pulmonology or Thoracic Surgery recommended. 2. The "S" modifier above refers to potentially clinically significant non lung cancer related findings. Specifically, there is aortic atherosclerosis, in addition to 3 vessel coronary artery disease. Please note that although the presence of coronary artery calcium documents the presence of coronary artery disease, the severity of this disease and any potential stenosis cannot be assessed on this non-gated CT examination. Assessment for potential risk factor modification, dietary therapy or pharmacologic therapy may be warranted, if clinically indicated. 3. Mild diffuse bronchial wall thickening with very mild centrilobular and paraseptal emphysema; imaging findings suggestive of underlying COPD.  PET Scan 08/11/20: IMPRESSION: - 9 mm left lower lobe pulmonary nodule shows no FDG uptake. Recommend continued annual low-dose chest CT screening for lung carcinoma. - Gradually increased size of 5.8 cm right adnexal cystic lesion compared to prior CT in 2014. This lesion appears complex although it is suboptimally evaluated without IV contrast. This lesion shows no FDG uptake uptake, and is highly suspicious for low-grade cystic ovarian neoplasm. Suggest correlation with tumor markers, and recommend pelvic ultrasound for further characterization. [Patient referred to GYN]   EKG: 03/15/20 [facility]): NSR. Low voltage QRS.   CV: Nuclear stress test 03/15/21 ([facility]): Impression: Negative Lexiscan stress. LV function normal. No evidence of reversible  ischemia. Low risk study.  Carotid US  05/06/20: Summary:  - Right Carotid: Velocities in the right ICA are consistent with a 1-39% stenosis.  - Left Carotid: Velocities in the left ICA are consistent with a 1-39% stenosis.  - Vertebrals: Bilateral vertebral arteries demonstrate antegrade flow.  - Subclavians: Normal flow hemodynamics were seen in bilateral subclavian arteries.   Abdominal Aorta Study 05/06/20: Summary:  Abdominal Aorta: There is evidence of abnormal dilatation of the proximal  and mid Abdominal aorta. The largest aortic measurement is 2.8 cm. The  largest aortic diameter has increased compared to prior exam. Previous  diameter measurement was 2.2 cm  obtained on 05/05/2019.   Echo 10/22/17 ([facility]): INTERPRETATION  NORMAL LEFT VENTRICULAR SYSTOLIC FUNCTION  WITH MILD LVH  NORMAL RIGHT VENTRICULAR SYSTOLIC FUNCTION  MILD VALVULAR REGURGITATION (See above)  NO VALVULAR STENOSIS  TRIVIAL MR  MILD TR  EF 60%  Closest EF: >55% (Estimated)  LVH: MILD LVH  Mitral: TRIVIAL MR  Tricuspid: MILD TR    Past Medical History:  Diagnosis Date  . AAA (abdominal aortic aneurysm) ([place])    s/p EVAR AAA 03/27/13  . Allergy   . Anxiety   . Arthritis   . Asthma   . Complication of anesthesia    BP drops after  . Coronary artery disease   . Depression   . Diabetes mellitus without complication ([place])   .  Dyspnea   . Emphysema of lung ([place])   . GERD (gastroesophageal reflux disease)   . History of kidney stones   . Hyperlipidemia   . Hypertension   . Osteoporosis   . Sleep apnea   . Tobacco abuse counseling     Past Surgical History:  Procedure Laterality Date  . ABDOMINAL AORTIC ENDOVASCULAR STENT GRAFT  03/27/2013   Dr. Hortencia Pilar  . Cardiac catheterization  3/210   70-80% stenosis RCA stent placed. started on Plavix  . CARDIAC CATHETERIZATION    . COLONOSCOPY WITH PROPOFOL N/A 01/05/2021   Procedure: COLONOSCOPY WITH PROPOFOL;  Surgeon: Virgel Manifold, MD;  Location: [facility] ENDOSCOPY;   Service: Endoscopy;  Laterality: N/A;  . CORONARY ANGIOPLASTY     stent placed  . ESOPHAGOGASTRODUODENOSCOPY (EGD) WITH PROPOFOL N/A 01/05/2021   Procedure: ESOPHAGOGASTRODUODENOSCOPY (EGD) WITH PROPOFOL;  Surgeon: Virgel Manifold, MD;  Location: [facility] ENDOSCOPY;  Service: Endoscopy;  Laterality: N/A;  . KIDNEY STONE SURGERY  1999  . TUBAL LIGATION    . VAGINAL HYSTERECTOMY     Menometrorrhagia. Excessive bleeding. Unknown if cervix removed.     MEDICATIONS: . albuterol (PROVENTIL HFA) 108 (90 Base) MCG/ACT inhaler  . allopurinol (ZYLOPRIM) 100 MG tablet  . aspirin 81 MG tablet  . bisoprolol (ZEBETA) 10 MG tablet  . Cholecalciferol 25 MCG (1000 UT) capsule  . clopidogrel (PLAVIX) 75 MG tablet  . cyanocobalamin 1000 MCG tablet  . gabapentin (NEURONTIN) 300 MG capsule  . ibuprofen (ADVIL) 200 MG tablet  . lisinopril-hydrochlorothiazide (ZESTORETIC) 20-12.5 MG tablet  . oxycodone (OXY-IR) 5 MG capsule  . pantoprazole (PROTONIX) 20 MG tablet  . Polyethyl Glycol-Propyl Glycol (LUBRICATING EYE DROPS) 0.4-0.3 % SOLN  . rosuvastatin (CRESTOR) 20 MG tablet  . Semaglutide, 1 MG/DOSE, (OZEMPIC, 1 MG/DOSE,) 2 MG/1.5ML SOPN   No current facility-administered medications for this encounter.    Myra Gianotti, PA-C Surgical Short Stay/Anesthesiology [facility] Phone [phone] [facility] Phone [phone] 04/05/2021 12:53 PM

## 2021-04-06 ENCOUNTER — Inpatient Hospital Stay (HOSPITAL_COMMUNITY)
Admission: RE | Admit: 2021-04-06 | Discharge: 2021-04-11 | DRG: 165 | Disposition: A | Discharge status: Home / Self Care | Payer: Medicare Other | Attending: Thoracic Surgery (Cardiothoracic Vascular Surgery) | Admitting: Thoracic Surgery (Cardiothoracic Vascular Surgery)

## 2021-04-06 ENCOUNTER — Inpatient Hospital Stay (HOSPITAL_COMMUNITY): Payer: Medicare Other | Admitting: Student

## 2021-04-06 ENCOUNTER — Inpatient Hospital Stay (HOSPITAL_COMMUNITY): Payer: Medicare Other

## 2021-04-06 ENCOUNTER — Inpatient Hospital Stay (HOSPITAL_COMMUNITY): Payer: Medicare Other | Admitting: Vascular Surgery

## 2021-04-06 ENCOUNTER — Other Ambulatory Visit: Payer: Self-pay

## 2021-04-06 ENCOUNTER — Encounter (HOSPITAL_COMMUNITY): Payer: Self-pay | Admitting: Thoracic Surgery (Cardiothoracic Vascular Surgery)

## 2021-04-06 ENCOUNTER — Encounter (HOSPITAL_COMMUNITY)
Admission: RE | Disposition: A | Discharge status: Home / Self Care | Payer: Self-pay | Source: Home / Self Care | Attending: Thoracic Surgery (Cardiothoracic Vascular Surgery)

## 2021-04-06 DIAGNOSIS — G4730 Sleep apnea, unspecified: Secondary | ICD-10-CM | POA: Diagnosis present

## 2021-04-06 DIAGNOSIS — I10 Essential (primary) hypertension: Secondary | ICD-10-CM | POA: Diagnosis present

## 2021-04-06 DIAGNOSIS — F17210 Nicotine dependence, cigarettes, uncomplicated: Secondary | ICD-10-CM | POA: Diagnosis present

## 2021-04-06 DIAGNOSIS — Z902 Acquired absence of lung [part of]: Secondary | ICD-10-CM

## 2021-04-06 DIAGNOSIS — K047 Periapical abscess without sinus: Secondary | ICD-10-CM | POA: Diagnosis present

## 2021-04-06 DIAGNOSIS — J9811 Atelectasis: Secondary | ICD-10-CM | POA: Diagnosis not present

## 2021-04-06 DIAGNOSIS — R911 Solitary pulmonary nodule: Secondary | ICD-10-CM | POA: Diagnosis present

## 2021-04-06 DIAGNOSIS — C3432 Malignant neoplasm of lower lobe, left bronchus or lung: Secondary | ICD-10-CM | POA: Diagnosis present

## 2021-04-06 DIAGNOSIS — I2510 Atherosclerotic heart disease of native coronary artery without angina pectoris: Secondary | ICD-10-CM | POA: Diagnosis present

## 2021-04-06 DIAGNOSIS — Z8249 Family history of ischemic heart disease and other diseases of the circulatory system: Secondary | ICD-10-CM

## 2021-04-06 DIAGNOSIS — Z955 Presence of coronary angioplasty implant and graft: Secondary | ICD-10-CM

## 2021-04-06 DIAGNOSIS — E7800 Pure hypercholesterolemia, unspecified: Secondary | ICD-10-CM | POA: Diagnosis not present

## 2021-04-06 DIAGNOSIS — Z4682 Encounter for fitting and adjustment of non-vascular catheter: Secondary | ICD-10-CM

## 2021-04-06 DIAGNOSIS — E8770 Fluid overload, unspecified: Secondary | ICD-10-CM | POA: Diagnosis not present

## 2021-04-06 DIAGNOSIS — E559 Vitamin D deficiency, unspecified: Secondary | ICD-10-CM | POA: Diagnosis not present

## 2021-04-06 DIAGNOSIS — Z888 Allergy status to other drugs, medicaments and biological substances status: Secondary | ICD-10-CM | POA: Diagnosis not present

## 2021-04-06 DIAGNOSIS — I700 Atherosclerosis of aorta: Secondary | ICD-10-CM | POA: Diagnosis present

## 2021-04-06 DIAGNOSIS — Z83438 Family history of other disorder of lipoprotein metabolism and other lipidemia: Secondary | ICD-10-CM

## 2021-04-06 DIAGNOSIS — E1151 Type 2 diabetes mellitus with diabetic peripheral angiopathy without gangrene: Secondary | ICD-10-CM | POA: Diagnosis present

## 2021-04-06 DIAGNOSIS — E669 Obesity, unspecified: Secondary | ICD-10-CM | POA: Diagnosis present

## 2021-04-06 DIAGNOSIS — J45909 Unspecified asthma, uncomplicated: Secondary | ICD-10-CM | POA: Diagnosis present

## 2021-04-06 DIAGNOSIS — Z79899 Other long term (current) drug therapy: Secondary | ICD-10-CM

## 2021-04-06 DIAGNOSIS — M810 Age-related osteoporosis without current pathological fracture: Secondary | ICD-10-CM | POA: Diagnosis present

## 2021-04-06 DIAGNOSIS — J811 Chronic pulmonary edema: Secondary | ICD-10-CM

## 2021-04-06 DIAGNOSIS — E785 Hyperlipidemia, unspecified: Secondary | ICD-10-CM | POA: Diagnosis present

## 2021-04-06 DIAGNOSIS — J439 Emphysema, unspecified: Secondary | ICD-10-CM | POA: Diagnosis present

## 2021-04-06 DIAGNOSIS — Z9689 Presence of other specified functional implants: Secondary | ICD-10-CM

## 2021-04-06 DIAGNOSIS — G8929 Other chronic pain: Secondary | ICD-10-CM | POA: Diagnosis present

## 2021-04-06 DIAGNOSIS — Z9889 Other specified postprocedural states: Secondary | ICD-10-CM

## 2021-04-06 DIAGNOSIS — K219 Gastro-esophageal reflux disease without esophagitis: Secondary | ICD-10-CM | POA: Diagnosis present

## 2021-04-06 DIAGNOSIS — Z7902 Long term (current) use of antithrombotics/antiplatelets: Secondary | ICD-10-CM | POA: Diagnosis not present

## 2021-04-06 DIAGNOSIS — M5136 Other intervertebral disc degeneration, lumbar region: Secondary | ICD-10-CM | POA: Diagnosis not present

## 2021-04-06 DIAGNOSIS — Z6838 Body mass index (BMI) 38.0-38.9, adult: Secondary | ICD-10-CM | POA: Diagnosis not present

## 2021-04-06 DIAGNOSIS — Z7982 Long term (current) use of aspirin: Secondary | ICD-10-CM

## 2021-04-06 HISTORY — PX: LUNG LOBECTOMY: SHX167

## 2021-04-06 HISTORY — PX: NODE DISSECTION: SHX5269

## 2021-04-06 HISTORY — PX: INTERCOSTAL NERVE BLOCK: SHX5021

## 2021-04-06 LAB — GLUCOSE, CAPILLARY
Glucose-Capillary: 109 mg/dL — ABNORMAL HIGH (ref 70–99)
Glucose-Capillary: 128 mg/dL — ABNORMAL HIGH (ref 70–99)
Glucose-Capillary: 129 mg/dL — ABNORMAL HIGH (ref 70–99)
Glucose-Capillary: 155 mg/dL — ABNORMAL HIGH (ref 70–99)

## 2021-04-06 LAB — POCT I-STAT, CHEM 8
BUN: 15 mg/dL (ref 8–23)
Calcium, Ion: 1.25 mmol/L (ref 1.15–1.40)
Chloride: 103 mmol/L (ref 98–111)
Creatinine, Ser: 0.5 mg/dL (ref 0.44–1.00)
Glucose, Bld: 107 mg/dL — ABNORMAL HIGH (ref 70–99)
HCT: 41 % (ref 36.0–46.0)
Hemoglobin: 13.9 g/dL (ref 12.0–15.0)
Potassium: 2.9 mmol/L — ABNORMAL LOW (ref 3.5–5.1)
Sodium: 142 mmol/L (ref 135–145)
TCO2: 25 mmol/L (ref 22–32)

## 2021-04-06 LAB — POCT I-STAT 7, (LYTES, BLD GAS, ICA,H+H)
Acid-Base Excess: 0 mmol/L (ref 0.0–2.0)
Bicarbonate: 26.1 mmol/L (ref 20.0–28.0)
Calcium, Ion: 1.24 mmol/L (ref 1.15–1.40)
HCT: 39 % (ref 36.0–46.0)
Hemoglobin: 13.3 g/dL (ref 12.0–15.0)
O2 Saturation: 98 %
Potassium: 2.9 mmol/L — ABNORMAL LOW (ref 3.5–5.1)
Sodium: 142 mmol/L (ref 135–145)
TCO2: 28 mmol/L (ref 22–32)
pCO2 arterial: 45.7 mmHg (ref 32.0–48.0)
pH, Arterial: 7.365 (ref 7.350–7.450)
pO2, Arterial: 111 mmHg — ABNORMAL HIGH (ref 83.0–108.0)

## 2021-04-06 LAB — ABO/RH: ABO/RH(D): A POS

## 2021-04-06 LAB — PREPARE RBC (CROSSMATCH)

## 2021-04-06 SURGERY — WEDGE RESECTION, LUNG, ROBOT-ASSISTED, THORACOSCOPIC
Anesthesia: General | Site: Chest | Laterality: Left

## 2021-04-06 MED ORDER — 0.9 % SODIUM CHLORIDE (POUR BTL) OPTIME
TOPICAL | Status: DC | PRN
  Administered 2021-04-06: 2000 mL

## 2021-04-06 MED ORDER — ASPIRIN EC 81 MG PO TBEC
81.0000 mg | DELAYED_RELEASE_TABLET | Freq: Every day | ORAL | Status: DC
  Administered 2021-04-07 – 2021-04-11 (×5): 81 mg via ORAL
  Filled 2021-04-06 (×6): qty 1

## 2021-04-06 MED ORDER — TRAMADOL HCL 50 MG PO TABS
50.0000 mg | ORAL_TABLET | Freq: Four times a day (QID) | ORAL | Status: DC | PRN
  Administered 2021-04-08: 100 mg via ORAL
  Filled 2021-04-06: qty 2

## 2021-04-06 MED ORDER — SODIUM CHLORIDE 0.9 % IV SOLN
INTRAVENOUS | Status: DC | PRN
  Administered 2021-04-06: 25 ug/min via INTRAVENOUS

## 2021-04-06 MED ORDER — SODIUM CHLORIDE 0.9% IV SOLUTION: Freq: Once | INTRAVENOUS | Status: DC

## 2021-04-06 MED ORDER — DEXAMETHASONE SODIUM PHOSPHATE 10 MG/ML IJ SOLN
INTRAMUSCULAR | Status: AC
  Filled 2021-04-06: qty 1

## 2021-04-06 MED ORDER — LACTATED RINGERS IV SOLN: INTRAVENOUS | Status: DC

## 2021-04-06 MED ORDER — FENTANYL CITRATE (PF) 250 MCG/5ML IJ SOLN
INTRAMUSCULAR | Status: DC | PRN
  Administered 2021-04-06 (×2): 50 ug via INTRAVENOUS
  Administered 2021-04-06 (×2): 100 ug via INTRAVENOUS
  Administered 2021-04-06 (×2): 50 ug via INTRAVENOUS

## 2021-04-06 MED ORDER — PHENYLEPHRINE HCL (PRESSORS) 10 MG/ML IV SOLN
INTRAVENOUS | Status: DC | PRN
  Administered 2021-04-06: 80 ug via INTRAVENOUS

## 2021-04-06 MED ORDER — FENTANYL CITRATE (PF) 100 MCG/2ML IJ SOLN: 25.0000 ug | INTRAMUSCULAR | Status: DC | PRN

## 2021-04-06 MED ORDER — PANTOPRAZOLE SODIUM 20 MG PO TBEC
20.0000 mg | DELAYED_RELEASE_TABLET | Freq: Every day | ORAL | Status: DC
  Administered 2021-04-07 – 2021-04-11 (×5): 20 mg via ORAL
  Filled 2021-04-06 (×6): qty 1

## 2021-04-06 MED ORDER — SODIUM CHLORIDE FLUSH 0.9 % IV SOLN
INTRAVENOUS | Status: DC | PRN
  Administered 2021-04-06: 100 mL

## 2021-04-06 MED ORDER — FENTANYL CITRATE (PF) 250 MCG/5ML IJ SOLN
INTRAMUSCULAR | Status: AC
  Filled 2021-04-06: qty 5

## 2021-04-06 MED ORDER — OXYCODONE HCL 5 MG PO TABS: 5.0000 mg | ORAL_TABLET | Freq: Once | ORAL | Status: DC | PRN

## 2021-04-06 MED ORDER — GABAPENTIN 300 MG PO CAPS
300.0000 mg | ORAL_CAPSULE | Freq: Three times a day (TID) | ORAL | Status: DC
  Administered 2021-04-06 – 2021-04-11 (×14): 300 mg via ORAL
  Filled 2021-04-06 (×14): qty 1

## 2021-04-06 MED ORDER — CHLORHEXIDINE GLUCONATE CLOTH 2 % EX PADS
6.0000 | MEDICATED_PAD | Freq: Every day | CUTANEOUS | Status: DC
  Administered 2021-04-07 – 2021-04-11 (×2): 6 via TOPICAL

## 2021-04-06 MED ORDER — SODIUM CHLORIDE 0.9 % IV SOLN: INTRAVENOUS | Status: DC

## 2021-04-06 MED ORDER — BISACODYL 5 MG PO TBEC
10.0000 mg | DELAYED_RELEASE_TABLET | Freq: Every day | ORAL | Status: DC
  Administered 2021-04-06 – 2021-04-10 (×5): 10 mg via ORAL
  Filled 2021-04-06 (×7): qty 2

## 2021-04-06 MED ORDER — ENOXAPARIN SODIUM 40 MG/0.4ML IJ SOSY
40.0000 mg | PREFILLED_SYRINGE | INTRAMUSCULAR | Status: DC
  Administered 2021-04-06 – 2021-04-10 (×5): 40 mg via SUBCUTANEOUS
  Filled 2021-04-06 (×5): qty 0.4

## 2021-04-06 MED ORDER — PROPOFOL 10 MG/ML IV BOLUS
INTRAVENOUS | Status: AC
  Filled 2021-04-06: qty 20

## 2021-04-06 MED ORDER — KETOROLAC TROMETHAMINE 15 MG/ML IJ SOLN
15.0000 mg | Freq: Four times a day (QID) | INTRAMUSCULAR | Status: DC
  Administered 2021-04-06 – 2021-04-11 (×19): 15 mg via INTRAVENOUS
  Filled 2021-04-06 (×19): qty 1

## 2021-04-06 MED ORDER — ONDANSETRON HCL 4 MG/2ML IJ SOLN: 4.0000 mg | Freq: Once | INTRAMUSCULAR | Status: DC | PRN

## 2021-04-06 MED ORDER — CHLORHEXIDINE GLUCONATE 0.12 % MT SOLN: 15.0000 mL | Freq: Once | OROMUCOSAL | Status: AC

## 2021-04-06 MED ORDER — SODIUM CHLORIDE 0.9 % IV SOLN
INTRAVENOUS | Status: AC | PRN
  Administered 2021-04-06: 1000 mL via INTRAMUSCULAR

## 2021-04-06 MED ORDER — ONDANSETRON HCL 4 MG/2ML IJ SOLN: 4.0000 mg | Freq: Four times a day (QID) | INTRAMUSCULAR | Status: DC | PRN

## 2021-04-06 MED ORDER — INSULIN ASPART 100 UNIT/ML IJ SOLN
0.0000 [IU] | INTRAMUSCULAR | Status: DC
  Administered 2021-04-06 – 2021-04-07 (×3): 2 [IU] via SUBCUTANEOUS

## 2021-04-06 MED ORDER — ONDANSETRON HCL 4 MG/2ML IJ SOLN
INTRAMUSCULAR | Status: DC | PRN
  Administered 2021-04-06: 4 mg via INTRAVENOUS

## 2021-04-06 MED ORDER — SUGAMMADEX SODIUM 200 MG/2ML IV SOLN
INTRAVENOUS | Status: DC | PRN
  Administered 2021-04-06: 200 mg via INTRAVENOUS

## 2021-04-06 MED ORDER — ONDANSETRON HCL 4 MG/2ML IJ SOLN
INTRAMUSCULAR | Status: AC
  Filled 2021-04-06: qty 4

## 2021-04-06 MED ORDER — DEXAMETHASONE SODIUM PHOSPHATE 10 MG/ML IJ SOLN
INTRAMUSCULAR | Status: DC | PRN
  Administered 2021-04-06: 5 mg via INTRAVENOUS

## 2021-04-06 MED ORDER — ENOXAPARIN SODIUM 40 MG/0.4ML IJ SOSY: 40.0000 mg | PREFILLED_SYRINGE | Freq: Every day | INTRAMUSCULAR | Status: DC

## 2021-04-06 MED ORDER — CEFAZOLIN SODIUM-DEXTROSE 2-4 GM/100ML-% IV SOLN
2.0000 g | Freq: Three times a day (TID) | INTRAVENOUS | Status: AC
  Administered 2021-04-06 – 2021-04-07 (×2): 2 g via INTRAVENOUS
  Filled 2021-04-06 (×2): qty 100

## 2021-04-06 MED ORDER — MIDAZOLAM HCL 5 MG/5ML IJ SOLN
INTRAMUSCULAR | Status: DC | PRN
  Administered 2021-04-06: 2 mg via INTRAVENOUS

## 2021-04-06 MED ORDER — ACETAMINOPHEN 160 MG/5ML PO SOLN
1000.0000 mg | Freq: Four times a day (QID) | ORAL | Status: DC
  Filled 2021-04-06 (×2): qty 40.6

## 2021-04-06 MED ORDER — LIDOCAINE 2% (20 MG/ML) 5 ML SYRINGE
INTRAMUSCULAR | Status: DC | PRN
  Administered 2021-04-06: 40 mg via INTRAVENOUS

## 2021-04-06 MED ORDER — SENNOSIDES-DOCUSATE SODIUM 8.6-50 MG PO TABS
1.0000 | ORAL_TABLET | Freq: Every day | ORAL | Status: DC
  Administered 2021-04-06 – 2021-04-10 (×5): 1 via ORAL
  Filled 2021-04-06 (×5): qty 1

## 2021-04-06 MED ORDER — MIDAZOLAM HCL 2 MG/2ML IJ SOLN
INTRAMUSCULAR | Status: AC
  Filled 2021-04-06: qty 2

## 2021-04-06 MED ORDER — LACTATED RINGERS IV SOLN: INTRAVENOUS | Status: DC | PRN

## 2021-04-06 MED ORDER — ACETAMINOPHEN 500 MG PO TABS
1000.0000 mg | ORAL_TABLET | Freq: Four times a day (QID) | ORAL | Status: DC
  Administered 2021-04-06 – 2021-04-11 (×18): 1000 mg via ORAL
  Filled 2021-04-06 (×20): qty 2

## 2021-04-06 MED ORDER — ROCURONIUM BROMIDE 10 MG/ML (PF) SYRINGE
PREFILLED_SYRINGE | INTRAVENOUS | Status: DC | PRN
  Administered 2021-04-06: 20 mg via INTRAVENOUS
  Administered 2021-04-06: 60 mg via INTRAVENOUS
  Administered 2021-04-06: 10 mg via INTRAVENOUS

## 2021-04-06 MED ORDER — LEVALBUTEROL HCL 0.63 MG/3ML IN NEBU
0.6300 mg | INHALATION_SOLUTION | Freq: Four times a day (QID) | RESPIRATORY_TRACT | Status: DC | PRN
  Filled 2021-04-06: qty 3

## 2021-04-06 MED ORDER — BUPIVACAINE LIPOSOME 1.3 % IJ SUSP
INTRAMUSCULAR | Status: AC
  Filled 2021-04-06: qty 20

## 2021-04-06 MED ORDER — FENTANYL CITRATE (PF) 100 MCG/2ML IJ SOLN
25.0000 ug | INTRAMUSCULAR | Status: DC | PRN
Start: 2021-04-06 — End: 2021-04-10

## 2021-04-06 MED ORDER — OXYCODONE HCL 5 MG PO TABS
5.0000 mg | ORAL_TABLET | ORAL | Status: DC | PRN
  Administered 2021-04-06 – 2021-04-07 (×3): 10 mg via ORAL
  Administered 2021-04-08 (×2): 5 mg via ORAL
  Administered 2021-04-08 – 2021-04-11 (×2): 10 mg via ORAL
  Filled 2021-04-06 (×3): qty 2
  Filled 2021-04-06: qty 1
  Filled 2021-04-06: qty 2
  Filled 2021-04-06: qty 1
  Filled 2021-04-06: qty 2

## 2021-04-06 MED ORDER — OXYCODONE HCL 5 MG/5ML PO SOLN: 5.0000 mg | Freq: Once | ORAL | Status: DC | PRN

## 2021-04-06 MED ORDER — CHLORHEXIDINE GLUCONATE 0.12 % MT SOLN
OROMUCOSAL | Status: AC
  Administered 2021-04-06: 15 mL via OROMUCOSAL
  Filled 2021-04-06: qty 15

## 2021-04-06 MED ORDER — CEFAZOLIN SODIUM-DEXTROSE 2-4 GM/100ML-% IV SOLN
2.0000 g | INTRAVENOUS | Status: AC
  Administered 2021-04-06: 2 g via INTRAVENOUS

## 2021-04-06 MED ORDER — ORAL CARE MOUTH RINSE: 15.0000 mL | Freq: Once | OROMUCOSAL | Status: AC

## 2021-04-06 MED ORDER — PROPOFOL 10 MG/ML IV BOLUS
INTRAVENOUS | Status: DC | PRN
  Administered 2021-04-06: 150 mg via INTRAVENOUS

## 2021-04-06 MED ORDER — ROSUVASTATIN CALCIUM 20 MG PO TABS
20.0000 mg | ORAL_TABLET | Freq: Every day | ORAL | Status: DC
  Administered 2021-04-07 – 2021-04-11 (×5): 20 mg via ORAL
  Filled 2021-04-06 (×6): qty 1

## 2021-04-06 MED ORDER — ROCURONIUM BROMIDE 10 MG/ML (PF) SYRINGE
PREFILLED_SYRINGE | INTRAVENOUS | Status: AC
  Filled 2021-04-06: qty 10

## 2021-04-06 MED ORDER — LIDOCAINE 2% (20 MG/ML) 5 ML SYRINGE
INTRAMUSCULAR | Status: AC
  Filled 2021-04-06: qty 5

## 2021-04-06 MED ORDER — BUPIVACAINE HCL (PF) 0.5 % IJ SOLN
INTRAMUSCULAR | Status: AC
  Filled 2021-04-06: qty 30

## 2021-04-06 SURGICAL SUPPLY — 116 items
APPLIER CLIP ROT 10 11.4 M/L (STAPLE)
BLADE CLIPPER SURG (BLADE) ×2 IMPLANT
BNDG COHESIVE 6X5 TAN STRL LF (GAUZE/BANDAGES/DRESSINGS) ×2 IMPLANT
CANISTER SUCT 3000ML PPV (MISCELLANEOUS) ×2 IMPLANT
CANNULA REDUC XI 12-8 STAPL (CANNULA) ×2
CANNULA REDUCER 12-8 DVNC XI (CANNULA) ×2 IMPLANT
CATH THORACIC 28FR (CATHETERS) IMPLANT
CATH THORACIC 28FR RT ANG (CATHETERS) IMPLANT
CATH THORACIC 36FR (CATHETERS) IMPLANT
CATH THORACIC 36FR RT ANG (CATHETERS) IMPLANT
CLIP APPLIE ROT 10 11.4 M/L (STAPLE) IMPLANT
CLIP VESOCCLUDE MED 6/CT (CLIP) IMPLANT
CNTNR URN SCR LID CUP LEK RST (MISCELLANEOUS) ×9 IMPLANT
CONN ST 1/4X3/8  BEN (MISCELLANEOUS) ×1
CONN ST 1/4X3/8 BEN (MISCELLANEOUS) ×1 IMPLANT
CONT SPEC 4OZ STRL OR WHT (MISCELLANEOUS) ×9
DEFOGGER SCOPE WARMER CLEARIFY (MISCELLANEOUS) ×2 IMPLANT
DERMABOND ADVANCED (GAUZE/BANDAGES/DRESSINGS) ×1
DERMABOND ADVANCED .7 DNX12 (GAUZE/BANDAGES/DRESSINGS) ×1 IMPLANT
DRAIN CHANNEL 28F RND 3/8 FF (WOUND CARE) ×2 IMPLANT
DRAIN CHANNEL 32F RND 10.7 FF (WOUND CARE) IMPLANT
DRAPE ARM DVNC X/XI (DISPOSABLE) ×4 IMPLANT
DRAPE COLUMN DVNC XI (DISPOSABLE) ×1 IMPLANT
DRAPE CV SPLIT W-CLR ANES SCRN (DRAPES) ×2 IMPLANT
DRAPE DA VINCI XI ARM (DISPOSABLE) ×4
DRAPE DA VINCI XI COLUMN (DISPOSABLE) ×1
DRAPE INCISE IOBAN 66X45 STRL (DRAPES) IMPLANT
DRAPE ORTHO SPLIT 77X108 STRL (DRAPES) ×1
DRAPE SURG ORHT 6 SPLT 77X108 (DRAPES) ×1 IMPLANT
ELECT BLADE 6.5 EXT (BLADE) IMPLANT
ELECT REM PT RETURN 9FT ADLT (ELECTROSURGICAL) ×2
ELECTRODE REM PT RTRN 9FT ADLT (ELECTROSURGICAL) ×1 IMPLANT
GAUZE KITTNER 4X5 RF (MISCELLANEOUS) ×4 IMPLANT
GAUZE SPONGE 4X4 12PLY STRL (GAUZE/BANDAGES/DRESSINGS) ×2 IMPLANT
GLOVE SURG POLYISO LF SZ6 (GLOVE) ×4 IMPLANT
GLOVE SURG SIGNA 7.5 PF LTX (GLOVE) ×8 IMPLANT
GLOVE SURG SYN 8.0 (GLOVE) ×2 IMPLANT
GLOVE SURG UNDER POLY LF SZ6.5 (GLOVE) ×8 IMPLANT
GLOVE TRIUMPH SURG SIZE 7.5 (KITS) ×4 IMPLANT
GOWN STRL REUS W/ TWL LRG LVL3 (GOWN DISPOSABLE) ×2 IMPLANT
GOWN STRL REUS W/ TWL XL LVL3 (GOWN DISPOSABLE) ×4 IMPLANT
GOWN STRL REUS W/TWL 2XL LVL3 (GOWN DISPOSABLE) ×4 IMPLANT
GOWN STRL REUS W/TWL LRG LVL3 (GOWN DISPOSABLE) ×2
GOWN STRL REUS W/TWL XL LVL3 (GOWN DISPOSABLE) ×4
HEMOSTAT SURGICEL 2X14 (HEMOSTASIS) ×6 IMPLANT
IRRIGATION STRYKERFLOW (MISCELLANEOUS) ×1 IMPLANT
IRRIGATOR STRYKERFLOW (MISCELLANEOUS) ×2
KIT BASIN OR (CUSTOM PROCEDURE TRAY) ×2 IMPLANT
KIT SUCTION CATH 14FR (SUCTIONS) IMPLANT
KIT TURNOVER KIT B (KITS) ×2 IMPLANT
LOOP VESSEL SUPERMAXI WHITE (MISCELLANEOUS) IMPLANT
NEEDLE HYPO 25GX1X1/2 BEV (NEEDLE) ×2 IMPLANT
NEEDLE SPNL 22GX3.5 QUINCKE BK (NEEDLE) ×2 IMPLANT
NS IRRIG 1000ML POUR BTL (IV SOLUTION) ×4 IMPLANT
OBTURATOR OPTICAL STANDARD 8MM (TROCAR)
OBTURATOR OPTICAL STND 8 DVNC (TROCAR)
OBTURATOR OPTICALSTD 8 DVNC (TROCAR) IMPLANT
PACK CHEST (CUSTOM PROCEDURE TRAY) ×2 IMPLANT
PAD ARMBOARD 7.5X6 YLW CONV (MISCELLANEOUS) ×4 IMPLANT
RELOAD STAPLER 2.5X45 WHT DVNC (STAPLE) ×4 IMPLANT
RELOAD STAPLER 3.5X45 BLU DVNC (STAPLE) ×2 IMPLANT
RELOAD STAPLER 4.3X45 GRN DVNC (STAPLE) ×1 IMPLANT
RELOAD STAPLER 45 4.6 BLK DVNC (STAPLE) ×3 IMPLANT
SCISSORS LAP 5X35 DISP (ENDOMECHANICALS) IMPLANT
SEAL CANN UNIV 5-8 DVNC XI (MISCELLANEOUS) ×2 IMPLANT
SEAL XI 5MM-8MM UNIVERSAL (MISCELLANEOUS) ×2
SEALANT PROGEL (MISCELLANEOUS) IMPLANT
SEALANT SURG COSEAL 4ML (VASCULAR PRODUCTS) IMPLANT
SEALANT SURG COSEAL 8ML (VASCULAR PRODUCTS) IMPLANT
SET TUBE SMOKE EVAC HIGH FLOW (TUBING) ×2 IMPLANT
SOLUTION ELECTROLUBE (MISCELLANEOUS) IMPLANT
SPECIMEN JAR MEDIUM (MISCELLANEOUS) IMPLANT
SPONGE INTESTINAL PEANUT (DISPOSABLE) IMPLANT
SPONGE TONSIL TAPE 1 RFD (DISPOSABLE) IMPLANT
STAPLER 45 SUREFORM CVD (STAPLE) ×1
STAPLER 45 SUREFORM CVD DVNC (STAPLE) ×1 IMPLANT
STAPLER CANNULA SEAL DVNC XI (STAPLE) ×2 IMPLANT
STAPLER CANNULA SEAL XI (STAPLE) ×2
STAPLER RELOAD 2.5X45 WHITE (STAPLE) ×4
STAPLER RELOAD 2.5X45 WHT DVNC (STAPLE) ×4
STAPLER RELOAD 3.5X45 BLU DVNC (STAPLE) ×2
STAPLER RELOAD 3.5X45 BLUE (STAPLE) ×2
STAPLER RELOAD 4.3X45 GREEN (STAPLE) ×1
STAPLER RELOAD 4.3X45 GRN DVNC (STAPLE) ×1
STAPLER RELOAD 45 4.6 BLK (STAPLE) ×3
STAPLER RELOAD 45 4.6 BLK DVNC (STAPLE) ×3
SUT PDS AB 3-0 SH 27 (SUTURE) IMPLANT
SUT PROLENE 4 0 RB 1 (SUTURE)
SUT PROLENE 4-0 RB1 .5 CRCL 36 (SUTURE) IMPLANT
SUT SILK  1 MH (SUTURE) ×2
SUT SILK 1 MH (SUTURE) ×2 IMPLANT
SUT SILK 1 TIES 10X30 (SUTURE) ×2 IMPLANT
SUT SILK 2 0 SH (SUTURE) IMPLANT
SUT SILK 2 0SH CR/8 30 (SUTURE) IMPLANT
SUT SILK 3 0SH CR/8 30 (SUTURE) IMPLANT
SUT VIC AB 1 CTX 36 (SUTURE)
SUT VIC AB 1 CTX36XBRD ANBCTR (SUTURE) IMPLANT
SUT VIC AB 2-0 CTX 36 (SUTURE) IMPLANT
SUT VIC AB 3-0 MH 27 (SUTURE) IMPLANT
SUT VIC AB 3-0 X1 27 (SUTURE) ×2 IMPLANT
SUT VICRYL 0 TIES 12 18 (SUTURE) ×2 IMPLANT
SUT VICRYL 0 UR6 27IN ABS (SUTURE) ×2 IMPLANT
SUT VICRYL 2 TP 1 (SUTURE) IMPLANT
SYR 20ML LL LF (SYRINGE) ×4 IMPLANT
SYSTEM RETRIEVAL ANCHOR 12 (MISCELLANEOUS) ×2 IMPLANT
SYSTEM RETRIEVAL ANCHOR 15 (MISCELLANEOUS) ×2 IMPLANT
SYSTEM SAHARA CHEST DRAIN ATS (WOUND CARE) ×2 IMPLANT
TAPE CLOTH 4X10 WHT NS (GAUZE/BANDAGES/DRESSINGS) ×2 IMPLANT
TAPE CLOTH SURG 4X10 WHT LF (GAUZE/BANDAGES/DRESSINGS) ×2 IMPLANT
TIP APPLICATOR SPRAY EXTEND 16 (VASCULAR PRODUCTS) IMPLANT
TOWEL GREEN STERILE (TOWEL DISPOSABLE) ×4 IMPLANT
TRAY FOLEY MTR SLVR 16FR STAT (SET/KITS/TRAYS/PACK) ×2 IMPLANT
TROCAR BLADELESS 15MM (ENDOMECHANICALS) IMPLANT
TROCAR XCEL 12X100 BLDLESS (ENDOMECHANICALS) ×2 IMPLANT
TROCAR XCEL BLADELESS 5X75MML (TROCAR) IMPLANT
WATER STERILE IRR 1000ML POUR (IV SOLUTION) ×2 IMPLANT

## 2021-04-06 NOTE — Anesthesia Procedure Notes (Signed)
Procedure Name: Intubation Date/Time: 04/06/2021 2:15 PM Performed by: Glynda Jaeger, CRNA Pre-anesthesia Checklist: Patient identified, Emergency Drugs available, Suction available and Patient being monitored Patient Re-evaluated:Patient Re-evaluated prior to induction Oxygen Delivery Method: Circle system utilized Preoxygenation: Pre-oxygenation with 100% oxygen Induction Type: IV induction Ventilation: Mask ventilation without difficulty Laryngoscope Size: Mac and 3 Grade View: Grade I Tube type: Oral Endobronchial tube: Left and 37 Fr Number of attempts: 1 Airway Equipment and Method: Stylet and Fiberoptic brochoscope Placement Confirmation: ETT inserted through vocal cords under direct vision and positive ETCO2 Secured at: 29 cm Tube secured with: Tape Dental Injury: Teeth and Oropharynx as per pre-operative assessment  Comments: Performed by Jacqualine Code

## 2021-04-06 NOTE — Anesthesia Postprocedure Evaluation (Signed)
Anesthesia Post Note  Patient: Laura Nielsen  Procedure(s) Performed: Left XI ROBOTIC ASSISTED THORASCOPY-Left Lower Lobe WEDGE RESECTION, Possible LOWER LOBECTOMY (Left Chest) INTERCOSTAL NERVE BLOCK (Left Chest) NODE DISSECTION (Left Chest)     Patient location during evaluation: PACU Anesthesia Type: General Level of consciousness: awake and alert, patient cooperative and oriented Pain management: pain level controlled Vital Signs Assessment: post-procedure vital signs reviewed and stable Respiratory status: spontaneous breathing, nonlabored ventilation, respiratory function stable and patient connected to nasal cannula oxygen Cardiovascular status: blood pressure returned to baseline and stable Postop Assessment: no apparent nausea or vomiting Anesthetic complications: no   No complications documented.  Last Vitals:  Vitals:   04/06/21 1805 04/06/21 1820  BP: (!) 109/55 116/60  Pulse: 65 66  Resp: 12 18  Temp:  (!) 36.2 C  SpO2: 98% 98%    Last Pain:  Vitals:   04/06/21 1820  TempSrc:   PainSc: 0-No pain                 Kyshaun Barnette,E. Anayely Constantine

## 2021-04-06 NOTE — Anesthesia Procedure Notes (Signed)
Arterial Line Insertion Start/End5/03/2021 2:20 PM, 04/06/2021 2:23 PM Performed by: Glynda Jaeger, CRNA, CRNA  Preanesthetic checklist: patient identified, IV checked, site marked, risks and benefits discussed, surgical consent, monitors and equipment checked, pre-op evaluation, timeout performed and anesthesia consent Right, radial was placed Catheter size: 20 G Hand hygiene performed  and maximum sterile barriers used  Allen's test indicative of satisfactory collateral circulation Attempts: 1 Procedure performed without using ultrasound guided technique. Following insertion, dressing applied and Biopatch. Post procedure assessment: normal  Patient tolerated the procedure well with no immediate complications. Additional procedure comments: Performed by Linford Arnold .

## 2021-04-06 NOTE — Plan of Care (Signed)
  Problem: Education: Goal: Knowledge of disease or condition will improve Outcome: Progressing   Problem: Education: Goal: Knowledge of the prescribed therapeutic regimen will improve Outcome: Progressing   Problem: Cardiac: Goal: Will achieve and/or maintain hemodynamic stability Outcome: Progressing   Problem: Clinical Measurements: Goal: Postoperative complications will be avoided or minimized Outcome: Progressing   Problem: Pain Management: Goal: Pain level will decrease Outcome: Progressing   Problem: Skin Integrity: Goal: Wound healing without signs and symptoms infection will improve Outcome: Progressing   Problem: Education: Goal: Knowledge of General Education information will improve Description: Including pain rating scale, medication(s)/side effects and non-pharmacologic comfort measures Outcome: Progressing   Problem: Clinical Measurements: Goal: Diagnostic test results will improve Outcome: Progressing   Problem: Nutrition: Goal: Adequate nutrition will be maintained Outcome: Progressing   Problem: Pain Managment: Goal: General experience of comfort will improve Outcome: Progressing   Problem: Skin Integrity: Goal: Risk for impaired skin integrity will decrease Outcome: Progressing

## 2021-04-06 NOTE — Interval H&P Note (Signed)
History and Physical Interval Note:  04/06/2021 1:19 PM  Laura Nielsen  has presented today for surgery, with the diagnosis of LLL lung nodule.  The various methods of treatment have been discussed with the patient and family. After consideration of risks, benefits and other options for treatment, the patient has consented to  Procedure(s): Left XI ROBOTIC ASSISTED THORASCOPY-Left Lower Lobe WEDGE RESECTION, Possible LOWER LOBECTOMY (Left) as a surgical intervention.  The patient's history has been reviewed, patient examined, no change in status, stable for surgery.  I have reviewed the patient's chart and labs.  Questions were answered to the patient's satisfaction.     Melrose Nakayama

## 2021-04-06 NOTE — Transfer of Care (Signed)
Immediate Anesthesia Transfer of Care Note  Patient: Laura Nielsen  Procedure(s) Performed: Left XI ROBOTIC ASSISTED THORASCOPY-Left Lower Lobe WEDGE RESECTION, Possible LOWER LOBECTOMY (Left Chest) INTERCOSTAL NERVE BLOCK (Left Chest) NODE DISSECTION (Left Chest)  Patient Location: PACU  Anesthesia Type:General  Level of Consciousness: drowsy and patient cooperative  Airway & Oxygen Therapy: Patient Spontanous Breathing and Patient connected to nasal cannula oxygen  Post-op Assessment: Report given to RN, Post -op Vital signs reviewed and stable and Patient moving all extremities  Post vital signs: Reviewed and stable  Last Vitals:  Vitals Value Taken Time  BP 117/94 04/06/21 1750  Temp    Pulse 66 04/06/21 1755  Resp 17 04/06/21 1755  SpO2 95 % 04/06/21 1755  Vitals shown include unvalidated device data.  Last Pain:  Vitals:   04/06/21 1056  TempSrc:   PainSc: 3       Patients Stated Pain Goal: 3 (33/35/45 6256)  Complications: No complications documented.

## 2021-04-06 NOTE — Brief Op Note (Addendum)
04/06/2021  5:49 PM  PATIENT:  Laura Nielsen  65 y.o. female  PRE-OPERATIVE DIAGNOSIS:  Left Lower Lobe lung nodule  POST-OPERATIVE DIAGNOSIS: Non-small cell carcinoma left lower lobe, clinical stage IA(T1N0)  PROCEDURE:   Xi Robotic assisted left thoracoscopy Left lower lobe wedge resection Left lower lobectomy Lymph node dissection Intercostal nerve blocks levels 3 through 10  SURGEON:  Surgeon(s) and Role:    * Melrose Nakayama, MD - Primary  PHYSICIAN ASSISTANT:   Nicholes Rough, PA-C  ANESTHESIA:   general  EBL:  75 mL   BLOOD ADMINISTERED:none  DRAINS: ONE BLAKE CHEST TUBE   LOCAL MEDICATIONS USED:  BUPIVICAINE   SPECIMEN:  Source of Specimen:  LEFT LOWER LOBE  DISPOSITION OF SPECIMEN:  PATHOLOGY  COUNTS:  YES  TOURNIQUET:  * No tourniquets in log *  DICTATION: .Dragon Dictation  PLAN OF CARE: Admit to inpatient   PATIENT DISPOSITION:  PACU - hemodynamically stable.   Delay start of Pharmacological VTE agent (>24hrs) due to surgical blood loss or risk of bleeding: no  Findings: Frozen section positive for non-small cell carcinoma.  Bronchial margin negative.

## 2021-04-06 NOTE — H&P (Signed)
PCP is Fisher, Kirstie Peri, MD Referring Provider is Jacquelin Hawking, NP      Chief Complaint  Patient presents with  . Lung Lesion    Initial surgical consult, CT chest 3/10, PET 08/11/20, PFT 9/21    HPI: [name] is sent for consultation regarding a cavitary left lower lobe lung lesion.  Laura Nielsen is a 65 year old woman with a history of tobacco abuse, lung nodules, obesity, CAD, hypertension, hyperlipidemia, asthma, reflux, type 2 diabetes, sleep apnea requiring CPAP, osteoporosis, depression, and chronic pain.  She started smoking about age 61.  She smoked about 2 packs a day until recently.  She has been trying to quit and is down to less than half a pack a day.  She has been followed in the low-dose lung cancer screening program for several years now.  She has had multiple small groundglass lung nodules.  She had a CT in August 2021 which showed an enlarging solid nodule at the periphery of a cystic area in the left lower lobe.  The cystic area also was enlarging.  She had a PET/CT which did not show any significant metabolic activity.  She recently had another CT which showed the left lower lobe lesion to be the same to slightly larger.  She has a history of coronary disease but has not had any recent angina.  She does suffer from back pain which limits her walking.  She says she can walk 2 blocks without getting short of breath on a good day.  She thinks sometimes her shortness of breath is due to pain in her back and legs.  She does have claudication in her calves.  She has had headaches on an almost daily basis for years.  Complains of a dry cough.  She does use CPAP at night for her sleep apnea.  She has not had dizzy spells not necessarily related to exertion.  She is noticed that they happen sometimes when she is washing dishes.  She had intentionally lost weight previously but is currently gained 4 pounds over the past 3 months.  Zubrod Score: At the time of surgery this  patient's most appropriate activity status/level should be described as: [] ?    0    Normal activity, no symptoms [] ?    1    Restricted in physical strenuous activity but ambulatory, able to do out light work [x] ?    2    Ambulatory and capable of self care, unable to do work activities, up and about >50 % of waking hours                              [] ?    3    Only limited self care, in bed greater than 50% of waking hours [] ?    4    Completely disabled, no self care, confined to bed or chair [] ?    5    Moribund      Past Medical History:  Diagnosis Date  . Allergy   . Arthritis   . Asthma   . Depression   . Diabetes mellitus without complication ([place])   . Emphysema of lung ([place])   . GERD (gastroesophageal reflux disease)   . Hyperlipidemia   . Hypertension   . Osteoporosis   . Sleep apnea   . Tobacco abuse counseling          Past Surgical History:  Procedure Laterality Date  .  Cardiac catheterization  3/210   70-80% stenosis RCA stent placed. started on Plavix  . CARDIAC CATHETERIZATION    . COLONOSCOPY WITH PROPOFOL N/A 01/05/2021   Procedure: COLONOSCOPY WITH PROPOFOL;  Surgeon: Virgel Manifold, MD;  Location: [facility] ENDOSCOPY;  Service: Endoscopy;  Laterality: N/A;  . CORONARY ANGIOPLASTY     stent placed  . ESOPHAGOGASTRODUODENOSCOPY (EGD) WITH PROPOFOL N/A 01/05/2021   Procedure: ESOPHAGOGASTRODUODENOSCOPY (EGD) WITH PROPOFOL;  Surgeon: Virgel Manifold, MD;  Location: [facility] ENDOSCOPY;  Service: Endoscopy;  Laterality: N/A;  . KIDNEY STONE SURGERY  1999  . TUBAL LIGATION    . VAGINAL HYSTERECTOMY     Menometrorrhagia. Excessive bleeding. Unknown if cervix removed.   . vascular stent  03/28/2011   Dr. Delana Meyer, [facility]; Infrarenal         Family History  Problem Relation Age of Onset  . Hypertension Mother   . Coronary artery disease Mother   . Heart attack Mother        acute  . Cancer Mother   . Alcohol abuse Father    . Depression Father   . Hypertension Father   . Heart attack Father 53       acute  . Alcohol abuse Sister   . Hyperlipidemia Sister   . Hypertension Sister   . Cancer Sister 63  . Heart attack Sister        x's 2  . Coronary artery disease Sister 45       x's 2  . Breast cancer Sister 62    Social History Social History        Tobacco Use  . Smoking status: Current Every Day Smoker    Packs/day: 1.00    Years: 44.50    Pack years: 44.50    Types: Cigarettes  . Smokeless tobacco: Never Used  . Tobacco comment: 12/23/19 states she quit for 3 months and then started back. Pt is going to talk with MD about this.  Vaping Use  . Vaping Use: Never used  Substance Use Topics  . Alcohol use: Yes    Comment: rarely - once a year  . Drug use: No          Current Outpatient Medications  Medication Sig Dispense Refill  . albuterol (PROVENTIL HFA) 108 (90 Base) MCG/ACT inhaler Inhale 2 puffs into the lungs every 6 (six) hours as needed for wheezing or shortness of breath. 18 g 11  . allopurinol (ZYLOPRIM) 100 MG tablet Take 1 tablet (100 mg total) by mouth daily. 90 tablet 0  . aspirin 81 MG tablet Take 81 mg by mouth daily.     . bisoprolol (ZEBETA) 10 MG tablet Take 1 tablet (10 mg total) by mouth daily. 90 tablet 0  . Cholecalciferol 25 MCG (1000 UT) capsule Take 1,000 Units by mouth daily.    . clopidogrel (PLAVIX) 75 MG tablet Take 1 tablet (75 mg total) by mouth daily. 90 tablet 0  . cyanocobalamin 1000 MCG tablet Take 1,000 mcg by mouth daily.    . Ibuprofen 200 MG CAPS Take by mouth as needed.    Marland Kitchen lisinopril-hydrochlorothiazide (ZESTORETIC) 20-12.5 MG tablet Take 1 tablet by mouth daily. 90 tablet 0  . loratadine (CLARITIN) 10 MG tablet Take 10 mg by mouth daily as needed.    . montelukast (SINGULAIR) 10 MG tablet Take 1 tablet (10 mg total) by mouth at bedtime. 30 tablet 1  . rosuvastatin (CRESTOR) 20 MG tablet Take 1 tablet (20 mg  total)  by mouth daily. 90 tablet 0  . Semaglutide, 1 MG/DOSE, (OZEMPIC, 1 MG/DOSE,) 2 MG/1.5ML SOPN Inject 1 mg into the skin once a week. 2 pen 11  . gabapentin (NEURONTIN) 300 MG capsule Take 1 capsule (300 mg total) by mouth 2 (two) times daily AND 2 capsules (600 mg total) at bedtime. Take 1 capsule (300 mg total) by mouth 3 (three) times daily.. (Patient taking differently: Take 1 capsule (300 mg total) by mouth 2 (two) times daily AND 2 capsules (600 mg total) at bedtime.) 120 capsule 5  . oxyCODONE (ROXICODONE) 5 MG immediate release tablet Take 1 tablet (5 mg total) by mouth daily as needed for severe pain. 15 tablet 0  . pantoprazole (PROTONIX) 20 MG tablet Take 1 tablet (20 mg total) by mouth in the morning and at bedtime for 14 days. (Patient not taking: Reported on 02/10/2021) 28 tablet 0   No current facility-administered medications for this visit.         Allergies  Allergen Reactions  . Atorvastatin Other (See Comments)    Elevated blood sugar Elevated blood sugar  . Omeprazole Nausea And Vomiting  . Bupropion Nausea Only    Review of Systems  Constitutional: Positive for activity change. Negative for appetite change and unexpected weight change.  HENT: Positive for dental problem and hearing loss.   Respiratory: Positive for cough, shortness of breath and wheezing.   Gastrointestinal: Positive for abdominal pain (Heartburn) and constipation.  Genitourinary: Negative for difficulty urinating and dysuria.  Musculoskeletal: Positive for back pain, gait problem and myalgias.  Neurological: Positive for dizziness and headaches. Negative for syncope.       Memory problems.  Chronic pain  Hematological: Bruises/bleeds easily.  Psychiatric/Behavioral: Positive for dysphoric mood.  All other systems reviewed and are negative.   BP 134/82 (BP Location: Left Arm, Patient Position: Sitting)   Pulse 77   Resp 20   Ht 5\' 8"  (1.727 m)   SpO2 95% Comment: RA  BMI 38.92  kg/m  Physical Exam Vitals reviewed.  Constitutional:      General: She is not in acute distress.    Appearance: She is obese.  HENT:     Head: Normocephalic and atraumatic.  Eyes:     General: No scleral icterus.    Extraocular Movements: Extraocular movements intact.  Cardiovascular:     Rate and Rhythm: Normal rate and regular rhythm.     Heart sounds: Normal heart sounds. No murmur heard. No friction rub. No gallop.   Pulmonary:     Effort: Pulmonary effort is normal. No respiratory distress.     Breath sounds: No wheezing or rales.  Abdominal:     General: There is no distension.     Palpations: Abdomen is soft.     Tenderness: There is no abdominal tenderness.  Musculoskeletal:     Cervical back: No rigidity.     Right lower leg: No edema.     Left lower leg: No edema.  Lymphadenopathy:     Cervical: No cervical adenopathy.  Skin:    General: Skin is warm and dry.  Neurological:     General: No focal deficit present.     Mental Status: She is oriented to person, place, and time.     Cranial Nerves: No cranial nerve deficit.     Motor: No weakness.    Diagnostic Tests: CT CHEST WITHOUT CONTRAST FOR LUNG CANCER SCREENING NODULE FOLLOW-UP  TECHNIQUE: Multidetector CT imaging of the chest was performed following  the standard protocol without IV contrast.  COMPARISON: Low-dose lung cancer screening chest CT 07/30/2020.  FINDINGS: Cardiovascular: Heart size is normal. There is no significant pericardial fluid, thickening or pericardial calcification. There is aortic atherosclerosis, as well as atherosclerosis of the great vessels of the mediastinum and the coronary arteries, including calcified atherosclerotic plaque in the left anterior descending, left circumflex and right coronary arteries.  Mediastinum/Nodes: No pathologically enlarged mediastinal or hilar lymph nodes. Please note that accurate exclusion of hilar adenopathy is limited on noncontrast  CT scans. Esophagus is unremarkable in appearance. No axillary lymphadenopathy.  Lungs/Pleura: Multiple small pulmonary nodules are again noted throughout the lungs bilaterally. Many of these are similar in size and number to the prior examination. The nodule of concern on the prior study in the posterior aspect of the left lower lobe is relatively similar in size compared to the prior examination (axial image 180 of series 3), with a volume derived mean diameter of 9.6 mm. However, this nodule is again associated with a cystic region which demonstrates increasingly thickened walls and peripheral nodularity, most notable for an area along the posteromedial aspect of the wall on today's examination (axial image 180 of series 3), with a volume derived mean diameter of 8.8 mm. Overall, the entirety of this lesion remains highly concerning for primary bronchogenic adenocarcinoma. No acute consolidative airspace disease. No pleural effusions. Mild diffuse bronchial wall thickening with very mild centrilobular and paraseptal emphysema.  Upper Abdomen: Aortic atherosclerosis. Aortic stent graft incompletely imaged.  Musculoskeletal: There are no aggressive appearing lytic or blastic lesions noted in the visualized portions of the skeleton.  IMPRESSION: 1. Increasing prominence of a large irregular thick-walled cystic lesion with multiple areas of nodularity in the left lower lobe, highly concerning for primary bronchogenic adenocarcinoma, categorized as Lung-RADS 4BS, suspicious. Additional imaging evaluation or consultation with Pulmonology or Thoracic Surgery recommended. 2. The "S" modifier above refers to potentially clinically significant non lung cancer related findings. Specifically, there is aortic atherosclerosis, in addition to 3 vessel coronary artery disease. Please note that although the presence of coronary artery calcium documents the presence of coronary artery  disease, the severity of this disease and any potential stenosis cannot be assessed on this non-gated CT examination. Assessment for potential risk factor modification, dietary therapy or pharmacologic therapy may be warranted, if clinically indicated. 3. Mild diffuse bronchial wall thickening with very mild centrilobular and paraseptal emphysema; imaging findings suggestive of underlying COPD.  These results will be called to the ordering clinician or representative by the Radiologist Assistant, and communication documented in the PACS or [employer].  Aortic Atherosclerosis (ICD10-I70.0) and Emphysema (ICD10-J43.9).   Electronically Signed By: Vinnie Langton M.D. On: 02/11/2021 11:38 I personally reviewed the CT images.  There is a cystic lesion in the posterior left lower lobe with a complex variable wall thickness and a 9 mm solid nodule.  Pulmonary function testing 02/28/2021 FVC 2.57 (68%) FEV1 1.82 (63%) FEV1 1.97 (68%) postbronchodilator DLCO 16.28 (70%)  Impression: Laura Nielsen is a 65 year old woman with a history of tobacco abuse, lung nodules, obesity, CAD, hypertension, hyperlipidemia, asthma, reflux, type 2 diabetes, sleep apnea requiring CPAP, osteoporosis, depression, and chronic pain.  She has a greater than 50-pack-year history of smoking and continues to smoke although she has cut back considerably in the past couple of weeks.  She has had multiple lung nodules followed on low-dose CTs for lung cancer screening.  There was a cystic lesion in the midportion of the left lower lobe  posteriorly that has grown in size and also wall thickness over time.  There is a 9 mm solid nodular component associated with this.  She had a pad that area about 6 months ago, which did not show any activity.  However given the nature of the lesion that does not rule out the possibility of cancer.  The differential diagnosis includes infectious and inflammatory nodules.   However, this is most likely a low-grade adenocarcinoma.  We discussed potential options for diagnosis and treatment.  This is not favorable for CT-guided biopsy.  I think a bronchoscopic biopsy is is basically a coin flip as to whether he will get a decent sample or not.  If she would consider surgical resection for treatment then we should just go ahead and take the lesion out for definitive diagnosis and treatment at the same setting.  Plan would be to do a wedge resection followed by a lobectomy if the frozen section showed cancer.  She does have adequate pulmonary function to tolerate a lobectomy.  I described the potential surgical resection to her and her son.  We would plan a robotic left VATS for wedge resection and possible left lower lobectomy.  We will base a decision on whether to do a lobectomy on the intraoperative frozen section.  They understand the general nature of the procedure including the need for general anesthesia, the incisions to be used, the use of a drainage tube postoperatively, the expected hospital stay, and the overall recovery.  I informed them of the indications, risks, benefits, and alternatives.  They understand the risks include, but are not limited to death, MI, DVT, PE, bleeding, possible need for transfusion, infection, cardiac arrhythmias, stroke, as well as the possibility of other unforeseeable complications.  She would be a high risk patient due to her comorbidities including CAD, thoracic aortic atherosclerosis, obesity, ongoing tobacco abuse, COPD, but the risk is not prohibitive.  CAD-had a stent many years ago.  Still on Plavix.  She would need cardiac clearance prior to surgery due to her limited physical activity.  She is followed by Dr. Bartholome Bill in [place].  She has an appoint with him in May asked her to see if she can get that moved up and will check with his office as well.  Tobacco abuse-emphasized the importance of quitting smoking for  multiple reasons, including pulmonary and cardiovascular disease.  Dizziness-history is not entirely clear but she did mention that it sometimes happens when she is washing dishes.  She has severe athero-disease at the ostium of her left subclavian, so it is possible she has subclavian steal.  I asked her to pay attention to whether or not she is using her left arm when these episodes occur.  Plan:  She wishes to think over her options before making decision.  If she does proceed with surgery she needs cardiac clearance with Dr. Ubaldo Glassing.  She does have an appointment with him in May but we will see if that can be moved up.  She will call us to let us know she would like to schedule surgery or would like to come back to the office for further discussion after she has seen Dr. Tonna Corner, MD [facility] [phone]   No interval change. She was not felt to need any additional cardiac w/u prior to surgery  Remo Lipps C. Roxan Hockey, MD [facility] [phone]

## 2021-04-07 ENCOUNTER — Inpatient Hospital Stay (HOSPITAL_COMMUNITY): Payer: Medicare Other

## 2021-04-07 ENCOUNTER — Encounter (HOSPITAL_COMMUNITY): Payer: Self-pay | Admitting: Thoracic Surgery (Cardiothoracic Vascular Surgery)

## 2021-04-07 LAB — BASIC METABOLIC PANEL
Anion gap: 5 (ref 5–15)
BUN: 21 mg/dL (ref 8–23)
CO2: 26 mmol/L (ref 22–32)
Calcium: 8.7 mg/dL — ABNORMAL LOW (ref 8.9–10.3)
Chloride: 107 mmol/L (ref 98–111)
Creatinine, Ser: 0.78 mg/dL (ref 0.44–1.00)
GFR, Estimated: >60 mL/min (ref >60)
Glucose, Bld: 134 mg/dL — ABNORMAL HIGH (ref 70–99)
Potassium: 3.2 mmol/L — ABNORMAL LOW (ref 3.5–5.1)
Sodium: 138 mmol/L (ref 135–145)

## 2021-04-07 LAB — CBC
HCT: 40.1 % (ref 36.0–46.0)
Hemoglobin: 13.3 g/dL (ref 12.0–15.0)
MCH: 28.4 pg (ref 26.0–34.0)
MCHC: 33.2 g/dL (ref 30.0–36.0)
MCV: 85.5 fL (ref 80.0–100.0)
Platelets: 186 10*3/uL (ref 150–400)
RBC: 4.69 MIL/uL (ref 3.87–5.11)
RDW: 13.8 % (ref 11.5–15.5)
WBC: 12.3 10*3/uL — ABNORMAL HIGH (ref 4.0–10.5)
nRBC: 0 % (ref 0.0–0.2)

## 2021-04-07 LAB — TYPE AND SCREEN
ABO/RH(D): A POS
Antibody Screen: NEGATIVE
Unit division: 0
Unit division: 0

## 2021-04-07 LAB — BPAM RBC
Blood Product Expiration Date: 202205272359
Blood Product Expiration Date: 202205272359
Unit Type and Rh: 6200
Unit Type and Rh: 6200

## 2021-04-07 LAB — GLUCOSE, CAPILLARY
Glucose-Capillary: 133 mg/dL — ABNORMAL HIGH (ref 70–99)
Glucose-Capillary: 158 mg/dL — ABNORMAL HIGH (ref 70–99)
Glucose-Capillary: 133 mg/dL — ABNORMAL HIGH (ref 70–99)
Glucose-Capillary: 123 mg/dL — ABNORMAL HIGH (ref 70–99)

## 2021-04-07 MED ORDER — POLYVINYL ALCOHOL 1.4 % OP SOLN: 1.0000 [drp] | Freq: Every day | OPHTHALMIC | Status: DC | PRN

## 2021-04-07 MED ORDER — CLOPIDOGREL BISULFATE 75 MG PO TABS
75.0000 mg | ORAL_TABLET | Freq: Every day | ORAL | Status: DC
  Administered 2021-04-08 – 2021-04-11 (×4): 75 mg via ORAL
  Filled 2021-04-07 (×5): qty 1

## 2021-04-07 MED ORDER — VITAMIN B-12 1000 MCG PO TABS
1000.0000 ug | ORAL_TABLET | Freq: Every day | ORAL | Status: DC
  Administered 2021-04-07 – 2021-04-11 (×5): 1000 ug via ORAL
  Filled 2021-04-07 (×6): qty 1

## 2021-04-07 MED ORDER — ALLOPURINOL 100 MG PO TABS
100.0000 mg | ORAL_TABLET | Freq: Every day | ORAL | Status: DC
  Administered 2021-04-07 – 2021-04-11 (×5): 100 mg via ORAL
  Filled 2021-04-07 (×6): qty 1

## 2021-04-07 MED ORDER — POTASSIUM CHLORIDE CRYS ER 20 MEQ PO TBCR
30.0000 meq | EXTENDED_RELEASE_TABLET | Freq: Two times a day (BID) | ORAL | Status: AC
  Administered 2021-04-07 (×2): 30 meq via ORAL
  Filled 2021-04-07 (×2): qty 1

## 2021-04-07 MED ORDER — VITAMIN D 25 MCG (1000 UNIT) PO TABS
1000.0000 [IU] | ORAL_TABLET | Freq: Every day | ORAL | Status: DC
  Administered 2021-04-07 – 2021-04-11 (×5): 1000 [IU] via ORAL
  Filled 2021-04-07 (×6): qty 1

## 2021-04-07 NOTE — Plan of Care (Signed)
  Problem: Education: Goal: Knowledge of disease or condition will improve Outcome: Progressing   Problem: Education: Goal: Knowledge of the prescribed therapeutic regimen will improve Outcome: Progressing   Problem: Activity: Goal: Risk for activity intolerance will decrease Outcome: Progressing   Problem: Cardiac: Goal: Will achieve and/or maintain hemodynamic stability Outcome: Progressing   Problem: Clinical Measurements: Goal: Postoperative complications will be avoided or minimized Outcome: Progressing   Problem: Pain Management: Goal: Pain level will decrease Outcome: Progressing   Problem: Skin Integrity: Goal: Wound healing without signs and symptoms infection will improve Outcome: Progressing   Problem: Education: Goal: Knowledge of General Education information will improve Description: Including pain rating scale, medication(s)/side effects and non-pharmacologic comfort measures Outcome: Progressing   Problem: Clinical Measurements: Goal: Ability to maintain clinical measurements within normal limits will improve Outcome: Progressing   Problem: Clinical Measurements: Goal: Will remain free from infection Outcome: Progressing   Problem: Clinical Measurements: Goal: Respiratory complications will improve Outcome: Progressing   Problem: Activity: Goal: Risk for activity intolerance will decrease Outcome: Progressing   Problem: Nutrition: Goal: Adequate nutrition will be maintained Outcome: Progressing   Problem: Elimination: Goal: Will not experience complications related to bowel motility Outcome: Progressing   Problem: Pain Managment: Goal: General experience of comfort will improve Outcome: Progressing

## 2021-04-07 NOTE — Progress Notes (Addendum)
[address]       [place] [address]             [phone]      1 Day Post-Op Procedure(s) (LRB): Left XI ROBOTIC ASSISTED THORASCOPY-Left Lower Lobe WEDGE RESECTION, Possible LOWER LOBECTOMY (Left) INTERCOSTAL NERVE BLOCK (Left) NODE DISSECTION (Left) Subjective: Feels okay this morning, she has been eating small amounts without nausea/vomiting. Pain has been well controlled however she is having some left shoulder pain likely from the chest tube.   Objective: Vital signs in last 24 hours: Temp:  [97 F (36.1 C)-98.6 F (37 C)] 98.3 F (36.8 C) (05/05 0708) Pulse Rate:  [65-75] 72 (05/05 0708) Cardiac Rhythm: Normal sinus rhythm (05/05 0330) Resp:  [12-18] 15 (05/05 0708) BP: (91-132)/(55-94) 91/61 (05/05 0708) SpO2:  [93 %-98 %] 93 % (05/05 0708) Arterial Line BP: (114-130)/(49-52) 128/52 (05/04 1820) Weight:  [111.7 kg] 111.7 kg (05/04 1106)     Intake/Output from previous day: 05/04 0701 - 05/05 0700 In: 1534.4 [P.O.:720; I.V.:714.4; IV Piggyback:100] Out: 735 [Urine:450; Blood:75; Chest Tube:210] Intake/Output this shift: No intake/output data recorded.  General appearance: alert, cooperative and no distress Heart: regular rate and rhythm, S1, S2 normal, no murmur, click, rub or gallop Lungs: clear to auscultation bilaterally Abdomen: soft, non-tender; bowel sounds normal; no masses,  no organomegaly Extremities: extremities normal, atraumatic, no cyanosis or edema Wound: clean and dry  Lab Results: Recent Labs    04/04/21 1129 04/06/21 1451 04/06/21 1504 04/07/21 0142  WBC 10.1  --   --  12.3*  HGB 16.1*   < > 13.9 13.3  HCT 47.9*   < > 41.0 40.1  PLT 246  --   --  186   < > = values in this interval not displayed.   BMET:  Recent Labs    04/04/21 1129 04/06/21 1451 04/06/21 1504 04/07/21 0142  NA 138   < > 142 138  K 3.5   < > 2.9* 3.2*  CL 104  --  103 107  CO2 21*  --   --  26  GLUCOSE 105*  --  107* 134*  BUN 21   --  15 21  CREATININE 0.69  --  0.50 0.78  CALCIUM 9.6  --   --  8.7*   < > = values in this interval not displayed.    PT/INR:  Recent Labs    04/04/21 1129  LABPROT 12.8  INR 1.0   ABG    Component Value Date/Time   PHART 7.365 04/06/2021 1451   HCO3 26.1 04/06/2021 1451   TCO2 25 04/06/2021 1504   O2SAT 98.0 04/06/2021 1451   CBG (last 3)  Recent Labs    04/06/21 2019 04/06/21 2331 04/07/21 0328  GLUCAP 129* 155* 133*    Assessment/Plan: S/P Procedure(s) (LRB): Left XI ROBOTIC ASSISTED THORASCOPY-Left Lower Lobe WEDGE RESECTION, Possible LOWER LOBECTOMY (Left) INTERCOSTAL NERVE BLOCK (Left) NODE DISSECTION (Left)  1. CV-NSR in the 70s, BP well controlled.  2. Pulm- tolerating 2L [place] with good oxygen saturation. CXR stable without pneumo seen 3. Renal-creatinine 0.78, will replace potassium, continue toradol for pain control 4. H and H 13.3/40.1, stable 5. Endo-blood glucose well controlled on current regimen   Plan: Discontinue IV fluids. No air leak with her chest tube and CXR stable. May be able to remove today. Pain is well controlled. Continue to wean oxygen as able and encouraged ambulation today. Foley remains in place but  urine output has been on the low side.    LOS: 1 day    Laura Nielsen 04/07/2021 Patient seen and examined, agree with above Looks good.  Mobilize Resume Plavix tomorrow  Laura Nielsen. Laura Hockey, MD [facility] [phone]

## 2021-04-07 NOTE — Hospital Course (Signed)
HPI:   Laura Nielsen is a 65 year old woman with a history of tobacco abuse, lung nodules, obesity, CAD, hypertension, hyperlipidemia, asthma, reflux, type 2 diabetes, sleep apnea requiring CPAP, osteoporosis, depression, and chronic pain.  She started smoking about age 61.  She smoked about 2 packs a day until recently.  She has been trying to quit and is down to less than half a pack a day.  She has been followed in the low-dose lung cancer screening program for several years now.  She has had multiple small groundglass lung nodules.  She had a CT in August 2021 which showed an enlarging solid nodule at the periphery of a cystic area in the left lower lobe.  The cystic area also was enlarging.  She had a PET/CT which did not show any significant metabolic activity.  She recently had another CT which showed the left lower lobe lesion to be the same to slightly larger.   She has a history of coronary disease but has not had any recent angina.  She does suffer from back pain which limits her walking.  She says she can walk 2 blocks without getting short of breath on a good day.  She thinks sometimes her shortness of breath is due to pain in her back and legs.  She does have claudication in her calves.  She has had headaches on an almost daily basis for years.  Complains of a dry cough.  She does use CPAP at night for her sleep apnea.  She has not had dizzy spells not necessarily related to exertion.  She is noticed that they happen sometimes when she is washing dishes.  She had intentionally lost weight previously but is currently gained 4 pounds over the past 3 months.  Hospital Course:   On 04/06/2021 Laura Nielsen underwent a robotic-assisted LLL wedge resection and lower lobectomy. She tolerated the procedure well and was transferred to the step down unit in stable condition. POD 1 her chest xray was stable and she didn't have an air leak. She continued to progress. We will plan to resume plavix tomorrow. We  continued to mobilize the patient as able.

## 2021-04-08 ENCOUNTER — Inpatient Hospital Stay (HOSPITAL_COMMUNITY): Payer: Medicare Other

## 2021-04-08 ENCOUNTER — Ambulatory Visit: Payer: Medicare Other | Admitting: Obstetrics & Gynecology

## 2021-04-08 LAB — CBC
HCT: 38.6 % (ref 36.0–46.0)
Hemoglobin: 12.7 g/dL (ref 12.0–15.0)
MCH: 28.3 pg (ref 26.0–34.0)
MCHC: 32.9 g/dL (ref 30.0–36.0)
MCV: 86 fL (ref 80.0–100.0)
Platelets: 185 10*3/uL (ref 150–400)
RBC: 4.49 MIL/uL (ref 3.87–5.11)
RDW: 13.9 % (ref 11.5–15.5)
WBC: 14.3 10*3/uL — ABNORMAL HIGH (ref 4.0–10.5)
nRBC: 0 % (ref 0.0–0.2)

## 2021-04-08 LAB — COMPREHENSIVE METABOLIC PANEL
ALT: 18 U/L (ref 0–44)
AST: 26 U/L (ref 15–41)
Albumin: 3.1 g/dL — ABNORMAL LOW (ref 3.5–5.0)
Alkaline Phosphatase: 59 U/L (ref 38–126)
Anion gap: 6 (ref 5–15)
BUN: 16 mg/dL (ref 8–23)
CO2: 27 mmol/L (ref 22–32)
Calcium: 8.9 mg/dL (ref 8.9–10.3)
Chloride: 105 mmol/L (ref 98–111)
Creatinine, Ser: 0.84 mg/dL (ref 0.44–1.00)
GFR, Estimated: >60 mL/min (ref >60)
Glucose, Bld: 119 mg/dL — ABNORMAL HIGH (ref 70–99)
Potassium: 3.5 mmol/L (ref 3.5–5.1)
Sodium: 138 mmol/L (ref 135–145)
Total Bilirubin: 0.5 mg/dL (ref 0.3–1.2)
Total Protein: 6 g/dL — ABNORMAL LOW (ref 6.5–8.1)

## 2021-04-08 MED ORDER — POTASSIUM CHLORIDE CRYS ER 20 MEQ PO TBCR
40.0000 meq | EXTENDED_RELEASE_TABLET | Freq: Two times a day (BID) | ORAL | Status: AC
  Administered 2021-04-08 (×2): 40 meq via ORAL
  Filled 2021-04-08: qty 4
  Filled 2021-04-08 (×2): qty 2

## 2021-04-08 NOTE — Op Note (Signed)
NAMENAO, LINZ MEDICAL RECORD NO: [id number] ACCOUNT NO: [id number] DATE OF BIRTH: August 27, 1956 FACILITY: [facility] LOCATION: [facility] PHYSICIAN: Revonda Standard. Roxan Hockey, MD  Operative Report   DATE OF PROCEDURE: 04/06/2021  PREOPERATIVE DIAGNOSIS:  Left lower lobe lung nodule.  POSTOPERATIVE DIAGNOSIS:  Non-small cell carcinoma, left lower lobe, clinical stage IA (T1, N0).  PROCEDURE PERFORMED:   Xi robotic-assisted left thoracoscopy, Wedge resection of left lower lobe nodule, Robotic left lower lobectomy, Lymph node dissection, Intercostal nerve blocks levels 3 through 10.  SURGEON:  Revonda Standard. Roxan Hockey, MD  ASSISTANT:  Nicholes Rough, PA-C  ANESTHESIA:  General.  FINDINGS:  Frozen section revealed non-small cell carcinoma.  Bronchial margin negative for tumor.  CLINICAL NOTE:  Mrs. Printy is a 65 year old woman with a history of tobacco abuse and a cavitary left lower lobe lung nodule.  She recently had a low dose screening CT for lung cancer, which showed the existing cavitary lesion in the left lower lobe now had a more  solid component.  She was advised to undergo surgical resection with plans for a wedge resection to be followed by lobectomy if this was non-small cell carcinoma.  The indications, risks, benefits, and alternatives were discussed in detail with the  patient.  She understood and accepted the risks and agreed to proceed.  OPERATIVE NOTE:  Mrs. Oleary was brought to the preoperative holding area on 04/06/2021.  She had placement of intravenous access and an arterial blood pressure monitoring line by the anesthesia service.  She then was taken to the operating room and  anesthetized and intubated with a double lumen endotracheal tube.  Intravenous antibiotics were administered.  A Foley catheter was placed.  Sequential compression devices were placed on the calves for DVT prophylaxis.  She was placed in a right lateral  decubitus position.  A Bair Hugger was placed for active  warming.  The left chest was prepped and draped in the usual sterile fashion.  Single lung ventilation of the right lung was initiated and was tolerated well throughout the procedure.  A time-out was performed.  A solution containing 20 mL of liposomal bupivacaine, 30 mL of 0.5% bupivacaine and 50 mL of saline was prepared.  This was used for local at these incision sites as well as for the intercostal nerve blocks.  An incision was  made in the eighth interspace in the midaxillary line and an 8 mm port was inserted.  The thoracoscope was advanced into the chest.   After confirming intrapleural placement, carbon dioxide was insufflated per protocol.  A 12 mm port was placed in the eighth  interspace 5 cm anterior to the camera and a 12 mm AirSeal port was placed in the tenth interspace centered between the two anterior ports.  The intercostal nerve blocks then were performed from the 3rd to the 10th interspace.  A needle was inserted from  a posterior approach and 10 mL of the bupivacaine solution was injected into a subpleural plane at each level.  A second 12 mm port was placed 5 cm posterior to the camera port and then an 8 mm port was placed 5 cm posterior to that for the retraction  arm.  The robot was deployed and the camera port was docked.  Targeting was performed.  The remaining ports were docked.  Robotic instruments were inserted with thoracoscopic visualization.  The inferior ligament was divided with bipolar cautery.  All lymph nodes that were encountered during the dissection were removed and sent as separate specimens  for permanent pathology.  The ligament was divided up to the level of the inferior pulmonary  vein and then the pleural reflection was divided posteriorly and level 7 nodes were removed. By this point, the lower lobe had decompressed sufficiently to allow a wedge resection to be performed.  The nodule was identified in the posterolateral aspect of  the lung and a wedge  resection was performed with sequential firings of the robotic stapler.  The specimen was placed into an endoscopic retrieval bag, removed and sent for frozen section of the nodule.  Margins were not necessary as the plan was to  complete an anatomic resection if this was non-small cell carcinoma.  While awaiting the results, the pleural reflection was divided at the hilum superiorly and the pleura overlying the aortopulmonary window was opened and level 5 nodes were removed.  At  this point, the frozen section had returned showing non-small cell carcinoma and the decision was made to proceed with lobectomy as discussed with the patient preoperatively.  The fissure was essentially complete posteriorly.  The pleura was divided  over the pulmonary artery and then the dissection was carried out along that plane.  Anteriorly, there was an area of the fissure that had to be divided with a stapler, posteriorly it was all done with bipolar cautery.  On dissecting out the pulmonary  artery branches, there were slight variations from the normal arterial branching pattern.  The more anterior branch was dissected out and divided with the robotic stapler.  The remainder of the basilar segmental trunk was encircled and divided.  The  inferior pulmonary vein was encircled and divided with a vascular cartridge on the robotic stapler.  Finally, there were two large branches coming off together at the site of the superior segmental artery.  The stapler was placed across both of these branches and  they were divided simultaneously.  The stapler then was placed across the bronchus.  Initially with placement of the stapler, there was no aeration of the upper lobe, so it was repositioned.  Once there was good aeration of the upper lobe with  ventilation, the stapler was fired transecting the lower lobe bronchus.  An attempt was made to place this specimen into a 12 mm endoscopic retrieval bag.  The majority of the lobe did fit  into the bag with a small portion that did not.  This was brought  down to the inferior aspect of the chest.  The robotic instruments were removed and the robot was undocked.  It should be noted that prior to placing the specimen into a bag and removing the robotic arms, all of the sponges and the vessel loops that had  been used had been removed.  The anterior eighth interspace incision was lengthened.  An attempt was made to remove the specimen through that incision, but was unsuccessful.  Decision was made to place the specimen into a larger endoscopic retrieval bag, so a 15 mm bag was placed.   The specimen was eventually placed into that bag completely and then was able to be removed through the anterior eighth interspace incision.  The chest was copiously irrigated with warm saline.  A test inflation to 30 cm of water revealed no leakage from  the bronchial stump.  All staple lines were inspected for hemostasis.  A 28-French Blake drain was placed through the original port incision and directed at the apex.  It was secured with a #1 silk suture.  After the frozen section  returned showing no  tumor at the bronchial margin, the remaining incisions were closed in standard fashion.  Dermabond was applied.  Chest tube was placed to a Pleur-Evac on waterseal and the patient was extubated in the operating room and taken to the postanesthetic care  unit in good condition.  All sponge, needle and instrument counts were correct at the end the procedure.  PAA D: 04/07/2021 1:49:56 pm T: 04/08/2021 3:12:00 am  JOB: [id number]/ [id number]

## 2021-04-08 NOTE — Care Management Important Message (Signed)
Important Message  Patient Details  Name: Laura Nielsen MRN: [id number] Date of Birth: 06-27-1956   Medicare Important Message Given:  Yes     Alima Naser 04/08/2021, 2:05 PM

## 2021-04-08 NOTE — Discharge Summary (Addendum)
[address]       [place] [address]             [phone]      Physician Discharge Summary  Patient ID: Laura Nielsen MRN: [id number] DOB/AGE: March 23, 1956 64 y.o.  Admit date: 04/06/2021 Discharge date: 04/11/2021  Admission Diagnoses: Cavitary nodule left lower lobe  Patient Active Problem List   Diagnosis Date Noted  . Nodule of lower lobe of left lung 04/06/2021  . Dysphagia   . Gastric erythema   . Columnar epithelial-lined lower esophagus   . Polyp of colon   . Positive colorectal cancer screening using Cologuard test 10/10/2020  . Complex ovarian cyst 09/22/2020  . Adnexal mass 09/22/2020  . Ovarian cyst, right 08/31/2020  . 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 Pharmacologic therapy 08/19/2020  . Disorder of skeletal system 08/19/2020  . Problems influencing health status 08/19/2020  . Fatty liver 08/09/2020  . Recurrent major depressive disorder, in partial remission ([place]) 04/20/2020  . Aortic atherosclerosis ([place]) 07/17/2019  . Unspecified inflammatory spondylopathy, lumbar region ([place]) 10/04/2018  . Abnormal MRI, lumbar spine (03/28/2017) 07/04/2017  . Abnormal MRI, cervical spine (03/28/2017) 07/04/2017  . Lumbar facet syndrome (Bilateral) (L>R) 04/26/2017  . Lumbar facet hypertrophy (multilevel) (Bilateral) 04/26/2017  . Long term current use of anticoagulant therapy 04/26/2017  . Neurogenic pain 04/26/2017  . Musculoskeletal pain 04/26/2017  . Chronic pain syndrome 03/08/2017  . Long term prescription opiate use 03/08/2017  . Opiate use 03/08/2017  . Chronic low back pain (Primary Area of Pain) (Bilateral) (L>R) 03/08/2017  . Chronic neck pain (Secondary area of Pain) (Bilateral) (R>L) 03/08/2017  . Cervicogenic headache (Right) 03/08/2017  . Chronic upper back pain (Third area of Pain) (Bilateral) (R>L) 03/08/2017  . Chronic hip pain (Bilateral) (L>R) 03/08/2017  . Osteoarthritis of hip (Bilateral) (L>R)  03/08/2017  . Cervical central spinal stenosis 03/08/2017  . Cervical spondylosis with radiculopathy (Right) (C5) 03/08/2017  . Lumbar spondylosis 03/08/2017  . Atherosclerosis of native arteries of extremity with intermittent claudication ([place]) 02/22/2017  . Gout 02/16/2016  . Ankle pain 11/17/2015  . Arthritis 05/14/2015  . Carotid artery narrowing 05/14/2015  . Claudication ([place]) 05/14/2015  . CAFL (chronic airflow limitation) ([place]) 05/14/2015  . Clinical depression 05/14/2015  . Emphysema lung ([place]) 05/14/2015  . Acid reflux 05/14/2015  . Urinary incontinence 05/14/2015  . Pins and needles sensation 05/14/2015  . Obstructive apnea 05/14/2015  . Morbid obesity ([place]) 05/14/2015  . History of abnormal cervical Papanicolaou smear 05/14/2015  . Peripheral blood vessel disorder ([place]) 05/14/2015  . B12 deficiency 05/14/2015  . Vitamin D insufficiency 05/14/2015  . AAA (abdominal aortic aneurysm) without rupture ([place]) 05/26/2014  . Aortic heart valve narrowing 01/31/2013  . Malaise and fatigue 09/26/2012  . CAD in native artery 03/17/2009  . Diabetes mellitus, type 2 ([place]) 02/05/2009  . Hypercholesteremia 06/24/2008  . Allergic rhinitis 10/25/2007  . Airway hyperreactivity 10/25/2007  . Smoking greater than 30 pack years 10/25/2007  . Narrowing of intervertebral disc space 10/25/2007  . Benign essential HTN 10/25/2007  . Arthritis, degenerative 10/25/2007    Discharge Diagnoses: Adenocarcinoma left lower lobe-clinical and pathologic stage Ia (T1, N0) Active Problems:   Nodule of lower lobe of left lung   S/P robot-assisted surgical procedure   Discharged Condition: good  HPI:   Laura Nielsen is a 65 year old woman with a history of tobacco abuse, lung nodules, obesity, CAD, hypertension, hyperlipidemia, asthma, reflux,  type 2 diabetes, sleep apnea requiring CPAP, osteoporosis, depression, and chronic pain.  She started smoking about age 25.  She smoked about 2 packs a day  until recently.  She has been trying to quit and is down to less than half a pack a day.  She has been followed in the low-dose lung cancer screening program for several years now.  She has had multiple small groundglass lung nodules.  She had a CT in August 2021 which showed an enlarging solid nodule at the periphery of a cystic area in the left lower lobe.  The cystic area also was enlarging.  She had a PET/CT which did not show any significant metabolic activity.  She recently had another CT which showed the left lower lobe lesion to be the same to slightly larger.   She has a history of coronary disease but has not had any recent angina.  She does suffer from back pain which limits her walking.  She says she can walk 2 blocks without getting short of breath on a good day.  She thinks sometimes her shortness of breath is due to pain in her back and legs.  She does have claudication in her calves.  She has had headaches on an almost daily basis for years.  Complains of a dry cough.  She does use CPAP at night for her sleep apnea.  She has not had dizzy spells not necessarily related to exertion.  She is noticed that they happen sometimes when she is washing dishes.  She had intentionally lost weight previously but is currently gained 4 pounds over the past 3 months.  Hospital Course:   On 04/06/2021 Laura Nielsen underwent a robotic-assisted LLL wedge resection and lower lobectomy. She tolerated the procedure well and was transferred to the step down unit in stable condition. POD 1 her chest xray was stable and she didn't have an air leak. She continued to progress. We will plan to resume plavix tomorrow. We continued to mobilize the patient as able.  Postop day 2 her x-ray remained stable without pneumothorax.  She has been on waterseal without an air leak.  We discontinued her chest tube on 04/08/2021.  Her follow-up chest x-ray showed no evidence of pneumothorax.  There was evidence of mild pulmonary edema.  The  patient was also notably more short of breath.  She was treated with IV Lasix for this.  She was also restarted on her home Zestoretic combination.  The patient developed dizziness and black out of her vision with ambulation.  She also developed desaturation with ambulation.  She was monitored closely.  PT consult was also obtained.  She developed dental pain from abscess tooth with associated facial edema.  She was started on Amoxicillin for this and will require close outpatient dental follow up.  Repeat CXR was obtained and showed improvement of previous pulmonary edema.  She was walked on 5/8 and did not qualify for home oxygen. Today, she is ambulating with limited assistance, tolerating room air, her incisions are healing well, and she is ready for discharge home.  Significant Diagnostic Studies:   CLINICAL DATA:  Chest tube present  EXAM: PORTABLE CHEST 1 VIEW  COMPARISON:  Apr 07, 2021  FINDINGS: Chest tube on the left has its tip in the medial apex region, stable. No pneumothorax appreciable. There is mild right base atelectasis. Lungs otherwise are clear. Heart upper normal in size with pulmonary vascularity normal. There is aortic atherosclerosis. There is calcification in each  carotid artery. No adenopathy. No bone lesions.  IMPRESSION: Stable chest tube position on the left without pneumothorax. No edema or airspace opacity. There is right base atelectasis. Cardiac silhouette is stable.  Aortic Atherosclerosis (ICD10-I70.0). There are also foci of carotid artery calcification bilaterally.   Electronically Signed   By: Lowella Grip III M.D.   On: 04/08/2021 09:02   Treatments:   NAME: PAITON, BOULTINGHOUSE MEDICAL RECORD NO: [id number] ACCOUNT NO: [id number] DATE OF BIRTH: 09-20-1956 [facility]ACILITY[facility]ATION: [facility] PHYSICIAN: Revonda Standard. Roxan Hockey, MD  Operative Report   DATE OF PROCEDURE: 04/06/2021  PREOPERATIVE DIAGNOSIS:  Left lower lobe lung  nodule.  POSTOPERATIVE DIAGNOSIS:  Non-small cell carcinoma, left lower lobe, clinical stage IA (T1, N0).  PROCEDURE PERFORMED:  Xi robotic-assisted left thoracoscopy, wedge resection of left lower lobe nodule, robotic left lower lobectomy, lymph node dissection, intercostal nerve blocks at levels 3 through 10.  SURGEON:  Revonda Standard. Roxan Hockey, MD  ASSISTANT:  Nicholes Rough, [facility]  ANESTHESIA:  General.  FINDINGS:  Frozen section revealed non-small cell carcinoma.  Bronchial margin negative for tumor.  Discharge Exam: Blood pressure 115/78, pulse 79, temperature 98 F (36.7 C), temperature source Oral, resp. rate 17, height 5\' 8"  (1.727 m), weight 111.7 kg, SpO2 93 %.   General appearance: alert, cooperative and no distress Heart: regular rate and rhythm, S1, S2 normal, no murmur, click, rub or gallop Lungs: clear to auscultation bilaterally Abdomen: soft, non-tender; bowel sounds normal; no masses,  no organomegaly Extremities: extremities normal, atraumatic, no cyanosis or edema Wound: clean and dry   Disposition: Discharge disposition: 01-Home or Self Care        Allergies as of 04/11/2021      Reactions   Atorvastatin Other (See Comments)   Elevated blood sugar   Omeprazole Nausea And Vomiting   Bupropion Nausea Only      Medication List    STOP taking these medications   oxycodone 5 MG capsule Commonly known as: OXY-IR Replaced by: oxyCODONE 5 MG immediate release tablet     TAKE these medications   acetaminophen 500 MG tablet Commonly known as: TYLENOL Take 2 tablets (1,000 mg total) by mouth every 6 (six) hours.   albuterol 108 (90 Base) MCG/ACT inhaler Commonly known as: Proventil HFA Inhale 2 puffs into the lungs every 6 (six) hours as needed for wheezing or shortness of breath.   allopurinol 100 MG tablet Commonly known as: ZYLOPRIM Take 1 tablet (100 mg total) by mouth daily.   amoxicillin 500 MG capsule Commonly known as: AMOXIL Take 1  capsule (500 mg total) by mouth every 8 (eight) hours for 7 days.   aspirin 81 MG tablet Take 81 mg by mouth daily.   bisoprolol 10 MG tablet Commonly known as: ZEBETA Take 1 tablet (10 mg total) by mouth daily.   Cholecalciferol 25 MCG (1000 UT) capsule Take 1,000 Units by mouth daily.   clopidogrel 75 MG tablet Commonly known as: PLAVIX Take 1 tablet (75 mg total) by mouth daily.   cyanocobalamin 1000 MCG tablet Take 1,000 mcg by mouth daily.   gabapentin 300 MG capsule Commonly known as: NEURONTIN Take 300-600 mg by mouth See admin instructions. 300 mg three times daily, will take an additional dose at bedtime if needed for pain   ibuprofen 200 MG tablet Commonly known as: ADVIL Take 400 mg by mouth 2 (two) times daily as needed (headaches).   lisinopril-hydrochlorothiazide 20-12.5 MG tablet Commonly known as: ZESTORETIC Take 1 tablet by mouth daily.  Lubricating Eye Drops 0.4-0.3 % Soln Generic drug: Polyethyl Glycol-Propyl Glycol Place 1 drop into both eyes daily as needed (dry eyes).   oxyCODONE 5 MG immediate release tablet Commonly known as: Oxy IR/ROXICODONE Take 1-2 tablets (5-10 mg total) by mouth every 4 (four) hours as needed for moderate pain. Replaces: oxycodone 5 MG capsule   Ozempic (1 MG/DOSE) 2 MG/1.5ML Sopn Generic drug: Semaglutide (1 MG/DOSE) Inject 1 mg into the skin once a week.   pantoprazole 20 MG tablet Commonly known as: Protonix Take 1 tablet (20 mg total) by mouth daily.   rosuvastatin 20 MG tablet Commonly known as: CRESTOR Take 1 tablet (20 mg total) by mouth daily.            Durable Medical Equipment  (From admission, onward)         Start     Ordered   04/10/21 1055  For home use only DME oxygen  Once       Question Answer Comment  Length of Need 6 Months   Mode or (Route) Nasal cannula   Liters per Minute 2   Frequency Continuous (stationary and portable oxygen unit needed)   Oxygen conserving device No    Oxygen delivery system Gas      04/10/21 1054          Follow-up Information    Birdie Sons, MD. Call in 1 day(s).   Specialty: Family Medicine Contact information: [address] [place] [address] [phone]        Melrose Nakayama, MD Follow up.   Specialty: Cardiothoracic Surgery Why: Your routine follow-up appointment is on 04/20/2021 at 3:45. PLease arrive at 3:15 for a chest xray located at [facility] which is on the first floor of our building.  Contact information: [address] [place] [address] [phone]               Signed: Elgie Collard 04/11/2021, 8:22 AM

## 2021-04-08 NOTE — Progress Notes (Addendum)
      [address]       [place] [address]             [phone]      2 Days Post-Op Procedure(s) (LRB): Left XI ROBOTIC ASSISTED THORASCOPY-Left Lower Lobe WEDGE RESECTION, Possible LOWER LOBECTOMY (Left) INTERCOSTAL NERVE BLOCK (Left) NODE DISSECTION (Left) Subjective: Feels okay this morning, no complaints  Objective: Vital signs in last 24 hours: Temp:  [97.6 F (36.4 C)-97.9 F (36.6 C)] 97.6 F (36.4 C) (05/06 0300) Pulse Rate:  [61-67] 61 (05/06 0300) Cardiac Rhythm: Normal sinus rhythm (05/06 0300) Resp:  [11-22] 22 (05/06 0300) BP: (95-136)/(57-80) 136/80 (05/06 0300) SpO2:  [90 %-96 %] 96 % (05/06 0300)     Intake/Output from previous day: 05/05 0701 - 05/06 0700 In: 240 [P.O.:240] Out: 1570 [Urine:1300; Chest Tube:270] Intake/Output this shift: No intake/output data recorded.  General appearance: alert, cooperative and no distress Heart: regular rate and rhythm, S1, S2 normal, no murmur, click, rub or gallop Lungs: clear to auscultation bilaterally Abdomen: soft, non-tender; bowel sounds normal; no masses,  no organomegaly Extremities: extremities normal, atraumatic, no cyanosis or edema Wound: clean and dry  Lab Results: Recent Labs    04/07/21 0142 04/08/21 0026  WBC 12.3* 14.3*  HGB 13.3 12.7  HCT 40.1 38.6  PLT 186 185   BMET:  Recent Labs    04/07/21 0142 04/08/21 0026  NA 138 138  K 3.2* 3.5  CL 107 105  CO2 26 27  GLUCOSE 134* 119*  BUN 21 16  CREATININE 0.78 0.84  CALCIUM 8.7* 8.9    PT/INR: No results for input(s): LABPROT, INR in the last 72 hours. ABG    Component Value Date/Time   PHART 7.365 04/06/2021 1451   HCO3 26.1 04/06/2021 1451   TCO2 25 04/06/2021 1504   O2SAT 98.0 04/06/2021 1451   CBG (last 3)  Recent Labs    04/07/21 0752 04/07/21 1055 04/07/21 1527  GLUCAP 158* 133* 123*    Assessment/Plan: S/P Procedure(s) (LRB): Left XI ROBOTIC ASSISTED THORASCOPY-Left Lower Lobe WEDGE  RESECTION, Possible LOWER LOBECTOMY (Left) INTERCOSTAL NERVE BLOCK (Left) NODE DISSECTION (Left)  1. CV-NSR in the 60s, BP well controlled. Start Plavix/asa today 2. Pulm- tolerating room air with good oxygen saturation. CXR stable without pneumo seen 3. Renal-creatinine 0.84, will replace potassium, continue toradol for pain control 4. H and H 12.7/38.6, stable 5. Endo-blood glucose well controlled on current regimen  Plan: Chest tube removal today. CXR in the morning. Encouraged ambulation and use of incentive spirometer.    LOS: 2 days    Elgie Collard 04/08/2021

## 2021-04-09 ENCOUNTER — Inpatient Hospital Stay (HOSPITAL_COMMUNITY): Payer: Medicare Other

## 2021-04-09 MED ORDER — POTASSIUM CHLORIDE CRYS ER 20 MEQ PO TBCR
20.0000 meq | EXTENDED_RELEASE_TABLET | Freq: Once | ORAL | Status: AC
  Administered 2021-04-09: 20 meq via ORAL
  Filled 2021-04-09: qty 1

## 2021-04-09 MED ORDER — LISINOPRIL 20 MG PO TABS
20.0000 mg | ORAL_TABLET | Freq: Every day | ORAL | Status: DC
  Administered 2021-04-10 – 2021-04-11 (×2): 20 mg via ORAL
  Filled 2021-04-09 (×3): qty 1

## 2021-04-09 MED ORDER — HYDROCHLOROTHIAZIDE 12.5 MG PO CAPS
12.5000 mg | ORAL_CAPSULE | Freq: Every day | ORAL | Status: DC
  Administered 2021-04-10 – 2021-04-11 (×2): 12.5 mg via ORAL
  Filled 2021-04-09 (×3): qty 1

## 2021-04-09 MED ORDER — FUROSEMIDE 10 MG/ML IJ SOLN
40.0000 mg | Freq: Once | INTRAMUSCULAR | Status: AC
  Administered 2021-04-09: 40 mg via INTRAVENOUS
  Filled 2021-04-09: qty 4

## 2021-04-09 NOTE — Progress Notes (Addendum)
      [address]       [place], [address]             [phone]      3 Days Post-Op Procedure(s) (LRB): Left XI ROBOTIC ASSISTED THORASCOPY-Left Lower Lobe WEDGE RESECTION, Possible LOWER LOBECTOMY (Left) INTERCOSTAL NERVE BLOCK (Left) NODE DISSECTION (Left)   Subjective:  Patient not feeling well today.  She states she had a rough night due to not sleeping with all the activity around the unit.  She also feels more short of breath than she had previously.  + ambulation   + BM  Objective: Vital signs in last 24 hours: Temp:  [97.6 F (36.4 C)-98.3 F (36.8 C)] 97.6 F (36.4 C) (05/07 0828) Pulse Rate:  [58-61] 61 (05/07 0828) Cardiac Rhythm: Normal sinus rhythm (05/07 0753) Resp:  [15-18] 18 (05/07 0828) BP: (115-140)/(60-88) 117/69 (05/07 0828) SpO2:  [91 %-97 %] 97 % (05/07 0828)  Intake/Output from previous day: 05/06 0701 - 05/07 0700 In: 600 [P.O.:600] Out: 70 [Chest Tube:70]  General appearance: alert, cooperative and no distress Heart: regular rate and rhythm Lungs: crackles bilaterally Abdomen: soft, non-tender; bowel sounds normal; no masses,  no organomegaly Extremities: extremities normal, atraumatic, no cyanosis or edema Wound: clean and dry  Lab Results: Recent Labs    04/07/21 0142 04/08/21 0026  WBC 12.3* 14.3*  HGB 13.3 12.7  HCT 40.1 38.6  PLT 186 185   BMET:  Recent Labs    04/07/21 0142 04/08/21 0026  NA 138 138  K 3.2* 3.5  CL 107 105  CO2 26 27  GLUCOSE 134* 119*  BUN 21 16  CREATININE 0.78 0.84  CALCIUM 8.7* 8.9    PT/INR: No results for input(s): LABPROT, INR in the last 72 hours. ABG    Component Value Date/Time   PHART 7.365 04/06/2021 1451   HCO3 26.1 04/06/2021 1451   TCO2 25 04/06/2021 1504   O2SAT 98.0 04/06/2021 1451   CBG (last 3)  Recent Labs    04/07/21 0752 04/07/21 1055 04/07/21 1527  GLUCAP 158* 133* 123*    Assessment/Plan: S/P Procedure(s) (LRB): Left XI ROBOTIC ASSISTED  THORASCOPY-Left Lower Lobe WEDGE RESECTION, Possible LOWER LOBECTOMY (Left) INTERCOSTAL NERVE BLOCK (Left) NODE DISSECTION (Left)  1. CV- NSR, BP slowly increasing- will plan to restart her home zestoretic tomorrow 2. Pulm- more short of breath today, no pneumothorax post chest tube removal, evidence of mild pulmonary edema 3. Renal- creatinine has been stable, will give IV Lasix this morning  4. Activity- encouraged patient to take a brief rest this morning, then to get up and ambulate around the unit today 5. Dispo- patient stable, no pneumothorax post chest tube removal, mild pulmonary edema on CXR, will give IV lasix today.. this could likely be source of increased shortness of breath, resume home zestoretic tomorrow, if remains stable and symptoms improve, possible d/c in AM   LOS: 3 days    Ellwood Handler, PA-C 04/09/2021   I have seen and examined the patient and agree with the assessment and plan as outlined.  Rexene Alberts, MD 04/09/2021 10:47 AM

## 2021-04-09 NOTE — Plan of Care (Signed)
Discussed with patient plan of care for the evening, pain management and proper ambulation to bathroom with some teach back displayed  Problem: Education: Goal: Knowledge of disease or condition will improve Outcome: Progressing

## 2021-04-09 NOTE — Progress Notes (Signed)
Patient went for walk this afternoon, during walk, she became light headed, dizzy, and hall began going dark. It was noted that the patient had desaturated into the 70s. Wheelchair was brought, patient helped down into chair and brought back to room. Oxygen was applied 2 L and patient recovered into the 90s in under 5 minutes.   Later when asked if she would like another walk, patient refused, and has generally felt very unwell since this walk up until at least 1930. Continue to monitor.

## 2021-04-10 MED ORDER — FUROSEMIDE 10 MG/ML IJ SOLN
40.0000 mg | Freq: Once | INTRAMUSCULAR | Status: AC
  Administered 2021-04-10: 40 mg via INTRAVENOUS
  Filled 2021-04-10: qty 4

## 2021-04-10 MED ORDER — POTASSIUM CHLORIDE CRYS ER 20 MEQ PO TBCR
40.0000 meq | EXTENDED_RELEASE_TABLET | Freq: Once | ORAL | Status: AC
  Administered 2021-04-10: 40 meq via ORAL
  Filled 2021-04-10: qty 2

## 2021-04-10 MED ORDER — AMOXICILLIN 500 MG PO CAPS
500.0000 mg | ORAL_CAPSULE | Freq: Three times a day (TID) | ORAL | Status: DC
  Administered 2021-04-10 – 2021-04-11 (×3): 500 mg via ORAL
  Filled 2021-04-10 (×5): qty 1

## 2021-04-10 NOTE — Progress Notes (Signed)
SATURATION QUALIFICATIONS: (This note is used to comply with regulatory documentation for home oxygen)  Patient Saturations on Room Air at Rest = 98  Patient Saturations on Room Air while Ambulating = 94%  Patient Saturations on 0 Liters of oxygen while Ambulating =   Please briefly explain why patient needs home oxygen:

## 2021-04-10 NOTE — Progress Notes (Addendum)
[address]       [place] [address]             [phone]      4 Days Post-Op Procedure(s) (LRB): Left XI ROBOTIC ASSISTED THORASCOPY-Left Lower Lobe WEDGE RESECTION, Possible LOWER LOBECTOMY (Left) INTERCOSTAL NERVE BLOCK (Left) NODE DISSECTION (Left)   Subjective:  Patient upset this morning.  She is apprehensive about being discharged home.  She noted that when she walked yesterday she got dizzy and her vision went black.  She also experienced shortness of breath with ambulation.  She is also complaining of tooth pain along the left side.  She states that she has degenerative bone disease and has teeth fall out constantly.  However her current one that is loose is infected, stating she can taste the infection.    Objective: Vital signs in last 24 hours: Temp:  [97.5 F (36.4 C)-97.9 F (36.6 C)] 97.9 F (36.6 C) (05/08 0808) Pulse Rate:  [59-73] 68 (05/08 0808) Cardiac Rhythm: Normal sinus rhythm (05/08 0700) Resp:  [14-20] 15 (05/08 0808) BP: (116-163)/(51-90) 149/76 (05/08 0808) SpO2:  [90 %-98 %] 95 % (05/08 0808)  Intake/Output from previous day: 05/07 0701 - 05/08 0700 In: 475 [P.O.:475] Out: 2250 [Urine:2250]  General appearance: alert, cooperative and no distress Heart: regular rate and rhythm Lungs: clear to auscultation bilaterally Abdomen: soft, non-tender; bowel sounds normal; no masses,  no organomegaly Extremities: edema trace Wound: clean and dryu  Lab Results: Recent Labs    04/08/21 0026  WBC 14.3*  HGB 12.7  HCT 38.6  PLT 185   BMET:  Recent Labs    04/08/21 0026  NA 138  K 3.5  CL 105  CO2 27  GLUCOSE 119*  BUN 16  CREATININE 0.84  CALCIUM 8.9    PT/INR: No results for input(s): LABPROT, INR in the last 72 hours. ABG    Component Value Date/Time   PHART 7.365 04/06/2021 1451   HCO3 26.1 04/06/2021 1451   TCO2 25 04/06/2021 1504   O2SAT 98.0 04/06/2021 1451   CBG (last 3)  Recent Labs     04/07/21 1055 04/07/21 1527  GLUCAP 133* 123*    Assessment/Plan: S/P Procedure(s) (LRB): Left XI ROBOTIC ASSISTED THORASCOPY-Left Lower Lobe WEDGE RESECTION, Possible LOWER LOBECTOMY (Left) INTERCOSTAL NERVE BLOCK (Left) NODE DISSECTION (Left)  1. CV- NSR, + HTN- home Zestoretic ordered 2. Pulm- continued shortness of breath, desaturates with ambulation into the 70s, continue flutter valve, will give repeat dose of IV lasix, and plan for home oxygen use 3. Renal- repeat diuretics today, recheck bmet in AM 4. Dental abscess- patient states she has degenerative bone disease and get loose teeth that fall out quite frequently.  Most recent one is infection, she has some associated swelling in her cheek as well.. will start Amoxicillin and she will require follow up with local dentist  5. Activity- nurse will ambulate today, check oxygen saturations for possible home use 6. Dispo- patient not ready for discharge, she is apprehensive she will fall after episode yesterday, will obtain PT/OT consult, repeat IV diuretics, Amoxicillin for dental abscess, if remains stable today and can ambulate without difficulty, may be able to discharge home tomorrow   LOS: 4 days    Ellwood Handler, PA-C 04/10/2021   I have seen and examined the patient and agree with the assessment and plan as outlined.  Still dyspneic with any activity.  Diuresing well. Will keep at least one more  day and recheck CXR and labs tomorrow.  Rexene Alberts, MD 04/10/2021 11:31 AM

## 2021-04-10 NOTE — Evaluation (Signed)
Physical Therapy Evaluation Patient Details Name: Laura Nielsen MRN: [id number] DOB: 1956-08-17 Today's Date: 04/10/2021   History of Present Illness  65 yo admitted 5/4 for VATS with LLL wedge resection. PMHx: tobacco abuse, obesity. lung nodules, HTN, HLD, CAD, DM, asthma, chronic pain  Clinical Impression  Pt pleasant and concerned about voiding with lasix but able to transition to [facility] prior to gait and toilet after. Pt with initial drop in sats to 87% with pivot to [facility] however not accurate pleth and with new monitor pt able to maintain >94% throughout activity on RA. Pt is limited with ability to take deep breaths by pain and encouraged continued use of incentive spirometer. Pt with decreased activity tolerance, gait and function with 2/4 DOE who will benefit from acute therapy to maximize mobility, safety and independence to decrease burden of care. Pt state no need for HHPT as she will educate grandson for assist but agreeable to [facility] for shower seat. Encouraged gait and mobility with nursing staff and up to [facility] for toileting.     Follow Up Recommendations No PT follow up    Equipment Recommendations  3in1 (PT)    Recommendations for Other Services       Precautions / Restrictions Precautions Precautions: Fall Precaution Comments: urinary incontinence      Mobility  Bed Mobility Overal bed mobility: Modified Independent             General bed mobility comments: increased time and effort with HOB 15 degrees    Transfers Overall transfer level: Needs assistance   Transfers: Sit to/from Stand;Stand Pivot Transfers Sit to Stand: Supervision Stand pivot transfers: Supervision       General transfer comment: supervision for lines with cues for hand placement. Pt stood from bed, pivot to [facility]. Sit and stand from rollator with cues for brakes. Sit and stand from end of bed. Sit to toilet  Ambulation/Gait Ambulation/Gait assistance: Supervision Gait Distance (Feet): 140  Feet Assistive device: 4-wheeled walker Gait Pattern/deviations: Step-through pattern;Decreased stride length;Trunk flexed   Gait velocity interpretation: 1.31 - 2.62 ft/sec, indicative of limited community ambulator General Gait Details: cues for posture and proximity to RW as well as breathing technique, energy conservation and paced activity. Pt on RA with sats >94% throughout with periods of pt reports cramping pulling type pain at left chest with VSS. Pt with 2/4 DOE and required seated rest on rollator prior to additional 80' to return to room. HR 103 with gait  Stairs            Wheelchair Mobility    Modified Rankin (Stroke Patients Only)       Balance Overall balance assessment: Needs assistance Sitting-balance support: Feet supported;No upper extremity supported Sitting balance-Leahy Scale: Good     Standing balance support: No upper extremity supported;Bilateral upper extremity supported Standing balance-Leahy Scale: Fair Standing balance comment: pt able to stand without UE support and walk short distance unassisted but benefits from UE support with gait                             Pertinent Vitals/Pain Pain Assessment: 0-10 Pain Score: 4  Pain Location: left chest with deep breath Pain Descriptors / Indicators: Squeezing Pain Intervention(s): Limited activity within patient's tolerance;Monitored during session;Repositioned    Home Living Family/patient expects to be discharged to:: Private residence Living Arrangements: Alone Available Help at Discharge: Family;Available 24 hours/day Type of Home: House Home Access: Stairs to enter  Entrance Stairs-Rails: Right Entrance Stairs-Number of Steps: 3 Home Layout: One level Home Equipment: Walker - 4 wheels;Other (comment) (adjustable bed)      Prior Function Level of Independence: Independent         Comments: pt reports periods of staggering at times, grandson to stay with her for 2 weeks      Hand Dominance        Extremity/Trunk Assessment   Upper Extremity Assessment Upper Extremity Assessment: Generalized weakness    Lower Extremity Assessment Lower Extremity Assessment: Generalized weakness    Cervical / Trunk Assessment Cervical / Trunk Assessment: Kyphotic  Communication   Communication: No difficulties  Cognition Arousal/Alertness: Awake/alert Behavior During Therapy: WFL for tasks assessed/performed Overall Cognitive Status: Within Functional Limits for tasks assessed                                        General Comments      Exercises     Assessment/Plan    PT Assessment Patient needs continued PT services  PT Problem List Decreased mobility;Decreased activity tolerance;Decreased knowledge of use of DME;Decreased balance;Cardiopulmonary status limiting activity       PT Treatment Interventions Gait training;Balance training;Functional mobility training;Therapeutic activities;Patient/family education;DME instruction;Therapeutic exercise;Stair training    PT Goals (Current goals can be found in the Care Plan section)  Acute Rehab PT Goals Patient Stated Goal: be able to take care of myself PT Goal Formulation: With patient Time For Goal Achievement: 04/24/21 Potential to Achieve Goals: Good    Frequency Min 3X/week   Barriers to discharge        Co-evaluation               AM-PAC PT "6 Clicks" Mobility  Outcome Measure Help needed turning from your back to your side while in a flat bed without using bedrails?: A Little Help needed moving from lying on your back to sitting on the side of a flat bed without using bedrails?: A Little Help needed moving to and from a bed to a chair (including a wheelchair)?: A Little Help needed standing up from a chair using your arms (e.g., wheelchair or bedside chair)?: A Little Help needed to walk in hospital room?: A Little Help needed climbing 3-5 steps with a railing? :  A Little 6 Click Score: 18    End of Session   Activity Tolerance: Patient tolerated treatment well Patient left: with call bell/phone within reach;Other (comment) (on toilet with RN aware) Nurse Communication: Mobility status PT Visit Diagnosis: Other abnormalities of gait and mobility (R26.89);Difficulty in walking, not elsewhere classified (R26.2)    Time: 6222-9798 PT Time Calculation (min) (ACUTE ONLY): 47 min   Charges:   PT Evaluation $PT Eval Moderate Complexity: 1 Mod PT Treatments $Gait Training: 8-22 mins $Therapeutic Activity: 8-22 mins        Coleston Dirosa P, PT Acute Rehabilitation Services Pager: [phone] Office: [place] 04/10/2021, 1:41 PM

## 2021-04-11 ENCOUNTER — Inpatient Hospital Stay (HOSPITAL_COMMUNITY): Payer: Medicare Other

## 2021-04-11 LAB — COMPREHENSIVE METABOLIC PANEL
ALT: 22 U/L (ref 0–44)
AST: 25 U/L (ref 15–41)
Albumin: 3 g/dL — ABNORMAL LOW (ref 3.5–5.0)
Alkaline Phosphatase: 92 U/L (ref 38–126)
Anion gap: 9 (ref 5–15)
BUN: 17 mg/dL (ref 8–23)
CO2: 26 mmol/L (ref 22–32)
Calcium: 9.3 mg/dL (ref 8.9–10.3)
Chloride: 104 mmol/L (ref 98–111)
Creatinine, Ser: 0.79 mg/dL (ref 0.44–1.00)
GFR, Estimated: >60 mL/min (ref >60)
Glucose, Bld: 101 mg/dL — ABNORMAL HIGH (ref 70–99)
Potassium: 3.8 mmol/L (ref 3.5–5.1)
Sodium: 139 mmol/L (ref 135–145)
Total Bilirubin: 0.9 mg/dL (ref 0.3–1.2)
Total Protein: 6 g/dL — ABNORMAL LOW (ref 6.5–8.1)

## 2021-04-11 LAB — CBC
HCT: 42.2 % (ref 36.0–46.0)
Hemoglobin: 13.9 g/dL (ref 12.0–15.0)
MCH: 28.3 pg (ref 26.0–34.0)
MCHC: 32.9 g/dL (ref 30.0–36.0)
MCV: 85.8 fL (ref 80.0–100.0)
Platelets: 205 10*3/uL (ref 150–400)
RBC: 4.92 MIL/uL (ref 3.87–5.11)
RDW: 13.9 % (ref 11.5–15.5)
WBC: 8.7 10*3/uL (ref 4.0–10.5)
nRBC: 0 % (ref 0.0–0.2)

## 2021-04-11 LAB — BRAIN NATRIURETIC PEPTIDE: B Natriuretic Peptide: 39.5 pg/mL (ref 0.0–100.0)

## 2021-04-11 MED ORDER — AMOXICILLIN 500 MG PO CAPS: 500.0000 mg | ORAL_CAPSULE | Freq: Three times a day (TID) | ORAL | 0 refills | Status: AC

## 2021-04-11 MED ORDER — OXYCODONE HCL 5 MG PO TABS: 5.0000 mg | ORAL_TABLET | ORAL | 0 refills | Status: DC | PRN

## 2021-04-11 MED ORDER — ACETAMINOPHEN 500 MG PO TABS: 1000.0000 mg | ORAL_TABLET | Freq: Four times a day (QID) | ORAL | 0 refills | Status: DC

## 2021-04-11 NOTE — Progress Notes (Signed)
      [address]       [place], [address]             [phone]      5 Days Post-Op Procedure(s) (LRB): Left XI ROBOTIC ASSISTED THORASCOPY-Left Lower Lobe WEDGE RESECTION, Possible LOWER LOBECTOMY (Left) INTERCOSTAL NERVE BLOCK (Left) NODE DISSECTION (Left) Subjective: Feels okay this morning, she is hoping to go home today and be with her cats  Objective: Vital signs in last 24 hours: Temp:  [97.6 F (36.4 C)-97.9 F (36.6 C)] 97.7 F (36.5 C) (05/09 0400) Pulse Rate:  [67-78] 74 (05/09 0400) Cardiac Rhythm: Normal sinus rhythm (05/08 2000) Resp:  [14-19] 19 (05/09 0400) BP: (116-153)/(76-89) 133/89 (05/09 0400) SpO2:  [92 %-100 %] 92 % (05/09 0400)     Intake/Output from previous day: 05/08 0701 - 05/09 0700 In: 420 [P.O.:420] Out: -  Intake/Output this shift: No intake/output data recorded.  General appearance: alert, cooperative and no distress Heart: regular rate and rhythm, S1, S2 normal, no murmur, click, rub or gallop Lungs: clear to auscultation bilaterally Abdomen: soft, non-tender; bowel sounds normal; no masses,  no organomegaly Extremities: extremities normal, atraumatic, no cyanosis or edema Wound: clean and dry  Lab Results: Recent Labs    04/11/21 0022  WBC 8.7  HGB 13.9  HCT 42.2  PLT 205   BMET:  Recent Labs    04/11/21 0022  NA 139  K 3.8  CL 104  CO2 26  GLUCOSE 101*  BUN 17  CREATININE 0.79  CALCIUM 9.3    PT/INR: No results for input(s): LABPROT, INR in the last 72 hours. ABG    Component Value Date/Time   PHART 7.365 04/06/2021 1451   HCO3 26.1 04/06/2021 1451   TCO2 25 04/06/2021 1504   O2SAT 98.0 04/06/2021 1451   CBG (last 3)  No results for input(s): GLUCAP in the last 72 hours.  Assessment/Plan: S/P Procedure(s) (LRB): Left XI ROBOTIC ASSISTED THORASCOPY-Left Lower Lobe WEDGE RESECTION, Possible LOWER LOBECTOMY (Left) INTERCOSTAL NERVE BLOCK (Left) NODE DISSECTION (Left)  1. CV- NSR in  the 60s, HTN mostly controlled on home Zestoretic ordered 2. Pulm-CXR stable this morning.  continued shortness of breath, walk test without de-sating, continue flutter valve, will give repeat dose of IV lasix, and plan for home oxygen use 3. Renal- creatinine 0.79, electrolytes okay. Diuretics given over the weekend for fluid overload 4. Dental abscess- patient states she has degenerative bone disease and get loose teeth that fall out quite frequently.  Most recent one is infection, she has some associated swelling in her cheek as well.. will start Amoxicillin and she will require follow up with local dentist  5. Activity- may need home oxygen to use with activity   Plan: No oxygen needed for home. Feels good today and ready for discharge. Encouraged her to see her dentist for tooth extraction. I will prescribe her 7 days of Amoxicillin    LOS: 5 days    Elgie Collard 04/11/2021

## 2021-04-11 NOTE — TOC Transition Note (Signed)
Transition of Care [facility]) - CM/SW Discharge Note   Patient Details  Name: Dashley Monts MRN: [id number] Date of Birth: 04-04-1956  Transition of Care [facility]) CM/SW Contact:  Zenon Mayo, RN Phone Number: 04/11/2021, 11:49 AM   Clinical Narrative:    NCM spoke with patient, she states that she does not want a HHRN, she does not know why she would need a HHRN.  She states she does want a shower chair but not the 3 n 1.  NCM made referral to [facility] for the shower chair.  This will be brought up to the room before discharge.    Final next level of care: Home/Self Care Barriers to Discharge: No Barriers Identified   Patient Goals and CMS Choice Patient states their goals for this hospitalization and ongoing recovery are:: return home      Discharge Placement                       Discharge Plan and Services                DME Arranged: Shower stool DME Agency: [employer] Date DME Agency Contacted: 04/11/21 Time DME Agency Contacted: [id number] Representative spoke with at DME Agency: [place]: Refused [place]          Social Determinants of Health ([place]) Interventions     Readmission Risk Interventions No flowsheet data found.

## 2021-04-11 NOTE — Discharge Instructions (Signed)
Robot-Assisted Thoracic Surgery  Robot-assisted thoracic surgery is a procedure in which robotic arms and surgical instruments are used to perform complex procedures through small incisions. During surgery, the surgeon sits at a console inside of the operating room and uses controls at the console to move the robotic arms. Surgical instruments are attached to the ends of the robotic arms. Instruments include a tool with a light and camera on the end of it (thoracoscope). The camera sends images to a video monitor that your surgeon will use to view the inside of your thoracic area. The thoracic area is between the neck and abdomen. Robot-assisted thoracic surgery may be done to:  Remove a tissue sample to be tested in a lab (biopsy).  Remove a part of a lung (lobectomy) or the whole lung (pneumonectomy).  Remove tumors.  Treat other conditions that affect: ? Your esophagus. This is the part of your body that carries food and liquid from your mouth to your stomach. ? Your diaphragm. This is a muscle wall between your lungs and stomach area. It helps with breathing.  Remove a portion of the nerves (sympathectomy) that cause excessive sweating. Tell a health care provider about:  Any allergies you have. This is especially important if you have allergies to medicines or sedatives.  All medicines you are taking, including vitamins, herbs, eye drops, creams, and over-the-counter medicines.  Any problems you or family members have had with anesthetic medicines.  Any blood disorders you have.  Any surgeries you have had.  Any medical conditions you have.  Whether you are pregnant or may be pregnant. What are the risks? Generally, this is a safe procedure. However, problems may occur, including:  Infection, such as pneumonia.  Severe bleeding (hemorrhage).  Allergic reactions to medicines.  Damage to organs or structures such as nerves or blood vessels. What happens before the  procedure? Staying hydrated Follow instructions from your health care provider about hydration, which may include:  Up to 2 hours before the procedure - you may continue to drink clear liquids, such as water, clear fruit juice, black coffee, and plain tea. Eating and drinking restrictions Follow instructions from your health care provider about eating and drinking, which may include:  8 hours before the procedure - stop eating heavy meals or foods, such as meat, fried foods, or fatty foods.  6 hours before the procedure - stop eating light meals or foods, such as toast or cereal.  6 hours before the procedure - stop drinking milk or drinks that contain milk.  2 hours before the procedure - stop drinking clear liquids. Medicines  Ask your health care provider about: ? Changing or stopping your regular medicines. This is especially important if you are taking diabetes medicines or blood thinners. ? Taking medicines such as aspirin and ibuprofen. These medicines can thin your blood. Do not take these medicines unless your health care provider tells you to take them. ? Taking over-the-counter medicines, vitamins, herbs, and supplements.  Talk with your health care provider about safe and effective ways to manage pain before and after your procedure. Pain management should fit your specific health needs. Tests  You may have tests, such as: ? CT scan. ? Ultrasound. ? Chest X-ray. ? Electrocardiogram (ECG).  You may have a blood or urine sample taken. General instructions  Ask your health care provider: ? How your surgery site will be marked. ? What steps will be taken to help prevent infection. These steps may include:  Removing  hair at the surgery site.  Washing skin with a germ-killing soap.  Receiving antibiotic medicine.  Do not use any products that contain nicotine or tobacco for at least 4 weeks before the procedure. These products include cigarettes, chewing tobacco, and  vaping devices, such as e-cigarettes. If you need help quitting, ask your health care provider.  Plan to have a responsible adult take you home from the hospital or clinic.  Plan to have a responsible adult care for you for the time you are told after you leave the hospital or clinic. This is important. What happens during the procedure?  An IV will be inserted into one of your veins.  You will be given one or more of the following: ? A medicine to help you relax (sedative). ? A medicine to make you fall asleep (general anesthetic). ? A medicine that is injected into an area of your body to numb everything below the injection site (regional anesthetic).  One to four small incisions will be made. The number of incisions and the area where incisions are made will depend on the purpose of your procedure.  A surgical assistant will then place instruments that are attached to the robotic arms into your thoracic cavity through these small incisions. The robotic arms are equipped with different surgical instruments and a high-definition 3D camera.  The surgeon will sit at a control console and see a magnified, high-resolution 3D image of the surgical field on a monitor. The surgeon will use master controls from the console that respond to the surgeon's movements in real time.  The computer-assisted robot will translate the surgeon's hand, wrist, or finger movements into precise movements of the four robotic arms so that the necessary procedures can be performed.  A drainage tube may be placed to drain excess fluid from the chest cavity.  Your incisions will be closed with stitches (sutures) or staples and covered with a bandage (dressing). The procedure may vary among health care providers and hospitals. What happens after the procedure?  Your blood pressure, heart rate, breathing rate, and blood oxygen level will be monitored until you leave the hospital or clinic.  You may have a drainage  tube in place. It may stay in place for a few days after the procedure to monitor for signs of air or fluid buildup in the chest cavity. Summary  Robot-assisted thoracic surgery is a procedure in which robotic arms and surgical instruments are used to help perform complex procedures through small incisions. During surgery, the surgeon sits at a console in the operating room and uses controls at the console to move the robotic arms.  A drainage tube may be placed to drain excess fluid from the chest cavity. The tube will be closely monitored for fluid or air buildup in the chest cavity. This information is not intended to replace advice given to you by your health care provider. Make sure you discuss any questions you have with your health care provider. Document Revised: 08/16/2020 Document Reviewed: 08/16/2020 Elsevier Patient Education  2021 [employer].

## 2021-04-11 NOTE — Progress Notes (Signed)
All set for discharge home, discharge instructions given to pt.  Awaiting ride home.

## 2021-04-12 ENCOUNTER — Encounter: Payer: Self-pay | Admitting: Thoracic Surgery (Cardiothoracic Vascular Surgery)

## 2021-04-13 ENCOUNTER — Telehealth: Payer: Self-pay

## 2021-04-13 LAB — SURGICAL PATHOLOGY

## 2021-04-13 NOTE — Telephone Encounter (Signed)
Copied from [place] [id number]. Topic: General - Other >> Apr 13, 2021 12:12 PM Tessa Lerner A wrote: Reason for CRM: Patient had left lung surgery on 04/06/21 and was discharged from [facility] on 04/11/21  Patient received a discharge letter instructing them to contact the practice within 1 day after surgery but was unable to call yesterday 04/12/21  Patient was uncertain of what the purpose of the call is for  Patient declined to schedule a phone call or appointment to discuss post surgical follow up  Patient shares that they are feeling "alright" and that they are learning to adjust their breathing  Please contact to further advise if needed

## 2021-04-14 NOTE — Telephone Encounter (Signed)
I called patient and patient verbalized understanding of information below.

## 2021-04-14 NOTE — Telephone Encounter (Signed)
She just needs to follow up with Dr. Leonard Downing on the 18th as scheduled if she is is feeling [place].  She does need to schedule follow up visit for diabetes in early august.

## 2021-04-15 ENCOUNTER — Other Ambulatory Visit: Payer: Self-pay | Admitting: Thoracic Surgery (Cardiothoracic Vascular Surgery)

## 2021-04-15 DIAGNOSIS — R911 Solitary pulmonary nodule: Secondary | ICD-10-CM

## 2021-04-20 ENCOUNTER — Ambulatory Visit
Admission: RE | Admit: 2021-04-20 | Discharge: 2021-04-20 | Disposition: A | Discharge status: Home / Self Care | Payer: Medicare Other | Source: Ambulatory Visit | Attending: Thoracic Surgery (Cardiothoracic Vascular Surgery) | Admitting: Thoracic Surgery (Cardiothoracic Vascular Surgery)

## 2021-04-20 ENCOUNTER — Ambulatory Visit (INDEPENDENT_AMBULATORY_CARE_PROVIDER_SITE_OTHER): Payer: Self-pay | Admitting: Thoracic Surgery (Cardiothoracic Vascular Surgery)

## 2021-04-20 ENCOUNTER — Other Ambulatory Visit: Payer: Self-pay

## 2021-04-20 ENCOUNTER — Encounter: Payer: Self-pay | Admitting: Thoracic Surgery (Cardiothoracic Vascular Surgery)

## 2021-04-20 VITALS — BP 136/85 | HR 74 | Resp 20 | Ht 68.0 in | Wt 246.0 lb

## 2021-04-20 DIAGNOSIS — J449 Chronic obstructive pulmonary disease, unspecified: Secondary | ICD-10-CM | POA: Diagnosis not present

## 2021-04-20 DIAGNOSIS — R911 Solitary pulmonary nodule: Secondary | ICD-10-CM

## 2021-04-20 DIAGNOSIS — Z9889 Other specified postprocedural states: Secondary | ICD-10-CM

## 2021-04-20 NOTE — Progress Notes (Signed)
[address]       [place] [address]             [phone]     HPI: [name] returns for a scheduled postoperative follow-up visit after a left lower lobectomy.  Laura Nielsen is a 65 year old woman with a history of tobacco abuse, long nails, obesity, CAD, hypertension, hyperlipidemia, asthma, reflux, type 2 diabetes, sleep apnea, osteoporosis, depression, and chronic pain.  She had a history of smoking about 2 packs a day until recently.  She had a CT in August 2021 which showed a cystic area with a small solid component in the left lower lobe.  On PET/CT there was not any significant metabolic activity.  A repeat CT recently showed an increase in size of the lesion and she was referred for surgical resection.  I did a robotic left lower lobectomy on 04/06/2021.  Her postoperative course was unremarkable and she went home on day 5.  There was a problem with her oxycodone prescription when she left the hospital.  She gets her pain medication through pain clinic and has a contract with them.  She never was able to get her prescription from [place] filled.  She is only been taking about 1 oxycodone a day.  She is on gabapentin 300 mg 3 times daily.  She has not had any respiratory issues.  She does complain of some pain along the left costal margin.  She describes it as feeling like "the nerves are starting to wake up."  Past Medical History:  Diagnosis Date  . AAA (abdominal aortic aneurysm) ([place])    s/p EVAR AAA 03/27/13  . Allergy   . Anxiety   . Arthritis   . Asthma   . Complication of anesthesia    BP drops after  . Coronary artery disease   . Depression   . Diabetes mellitus without complication ([place])   . Dyspnea   . Emphysema of lung ([place])   . GERD (gastroesophageal reflux disease)   . History of kidney stones   . Hyperlipidemia   . Hypertension   . Osteoporosis   . Sleep apnea   . Tobacco abuse counseling     Current Outpatient Medications  Medication  Sig Dispense Refill  . acetaminophen (TYLENOL) 500 MG tablet Take 2 tablets (1,000 mg total) by mouth every 6 (six) hours. 30 tablet 0  . albuterol (PROVENTIL HFA) 108 (90 Base) MCG/ACT inhaler Inhale 2 puffs into the lungs every 6 (six) hours as needed for wheezing or shortness of breath. 18 g 11  . allopurinol (ZYLOPRIM) 100 MG tablet Take 1 tablet (100 mg total) by mouth daily. 90 tablet 0  . aspirin 81 MG tablet Take 81 mg by mouth daily.     . bisoprolol (ZEBETA) 10 MG tablet Take 1 tablet (10 mg total) by mouth daily. 90 tablet 0  . Cholecalciferol 25 MCG (1000 UT) capsule Take 1,000 Units by mouth daily.    . clopidogrel (PLAVIX) 75 MG tablet Take 1 tablet (75 mg total) by mouth daily. 90 tablet 0  . cyanocobalamin 1000 MCG tablet Take 1,000 mcg by mouth daily.    Marland Kitchen gabapentin (NEURONTIN) 300 MG capsule Take 300-600 mg by mouth See admin instructions. 300 mg three times daily, will take an additional dose at bedtime if needed for pain    . ibuprofen (ADVIL) 200 MG tablet Take 400 mg by mouth 2 (two) times daily as needed (headaches).    Marland Kitchen  lisinopril-hydrochlorothiazide (ZESTORETIC) 20-12.5 MG tablet Take 1 tablet by mouth daily. 90 tablet 0  . pantoprazole (PROTONIX) 20 MG tablet Take 1 tablet (20 mg total) by mouth daily. 90 tablet 1  . Polyethyl Glycol-Propyl Glycol (LUBRICATING EYE DROPS) 0.4-0.3 % SOLN Place 1 drop into both eyes daily as needed (dry eyes).    . rosuvastatin (CRESTOR) 20 MG tablet Take 1 tablet (20 mg total) by mouth daily. 90 tablet 0  . Semaglutide, 1 MG/DOSE, (OZEMPIC, 1 MG/DOSE,) 2 MG/1.5ML SOPN Inject 1 mg into the skin once a week. 2 pen 11  . oxyCODONE (OXY IR/ROXICODONE) 5 MG immediate release tablet Take 1-2 tablets (5-10 mg total) by mouth every 4 (four) hours as needed for moderate pain. (Patient not taking: Reported on 04/20/2021) 30 tablet 0   No current facility-administered medications for this visit.    Physical Exam BP 136/85 (BP Location: Left Arm,  Patient Position: Sitting)   Pulse 74   Resp 20   Ht 5\' 8"  (1.727 m)   Wt 246 lb (111.6 kg)   SpO2 94% Comment: RA  BMI 37.37 kg/m  65 year old woman in no acute distress Alert and oriented x3 with no focal deficits Lungs diminished at left base but otherwise clear Cardiac regular rate and rhythm Incisions clean dry and intact  Diagnostic Tests: CHEST - 2 VIEW  COMPARISON:  04/11/2021  FINDINGS: Patient rotated minimally left. Mild cardiomegaly. Atherosclerosis in the transverse aorta. No pleural effusion or pneumothorax. Low lung volumes. Persistent volume loss at the left lung base. No congestive failure. Clear right lung.  IMPRESSION: No pneumothorax or other acute complication after left-sided nodule resection.  Persistent volume loss at the left lung base.  Aortic Atherosclerosis (ICD10-I70.0).  Cardiomegaly without congestive failure.   Electronically Signed   By: Abigail Miyamoto M.D.   On: 04/20/2021 15:30   Impression: Laura Nielsen is a 65 year old woman with history of tobacco abuse, obesity, CAD, hypertension, hyperlipidemia, asthma, reflux, type 2 diabetes, sleep apnea, osteoporosis, depression, and chronic pain.  She had an enlarging cystic lesion in the left lower lobe.  I did a robotic left lower lobectomy on 04/06/2021.  The nodule turned out to be a stage Ia (T1, N0) adenocarcinoma.  From a surgical perspective she is doing well.  She does have some incisional pain.  She is been using narcotics on a very limited basis.  She is getting contact her pain clinic to get a new prescription rather than me trying to write her prescription and potentially causing problems with her contract.  She is still on gabapentin 300 mg 3 times daily.  She does have a prescription to use that up to 600 mg at night and I advised her she may want to try that for some of her intercostal neuralgia pain.  She may begin driving on a limited basis.  Appropriate precautions were  discussed.  She should gradually increase her activities as tolerated.  I am going to refer her to an oncologist at [facility] for a long-term follow-up.  Plan: Referral to oncology at [facility] in 2 months with PA and lateral chest x-ray to check on progress  Melrose Nakayama, MD [facility] [phone]

## 2021-04-21 ENCOUNTER — Telehealth: Payer: Self-pay | Admitting: Student in an Organized Health Care Education/Training Program

## 2021-04-21 ENCOUNTER — Other Ambulatory Visit: Payer: Self-pay | Admitting: *Deleted

## 2021-04-21 NOTE — Progress Notes (Signed)
The proposed treatment discussed in cancer conference is for discussion purpose only and is not a binding recommendation. The patient was not physically examined nor present for their treatment options. Therefore, final treatment plans cannot be decided.  ?

## 2021-04-21 NOTE — Telephone Encounter (Signed)
Spoke with pharmacist, notified ok to fill.

## 2021-04-21 NOTE — Telephone Encounter (Signed)
Patient had surgery and wanted to let us know she got medication. The pharmacy needs a nurse from here to call and let them know its ok to fill script

## 2021-05-05 ENCOUNTER — Encounter (INDEPENDENT_AMBULATORY_CARE_PROVIDER_SITE_OTHER): Payer: Medicare Other

## 2021-05-05 ENCOUNTER — Ambulatory Visit (INDEPENDENT_AMBULATORY_CARE_PROVIDER_SITE_OTHER): Payer: Medicare Other | Admitting: Vascular Surgery

## 2021-05-09 ENCOUNTER — Encounter: Payer: Self-pay | Admitting: *Deleted

## 2021-05-09 ENCOUNTER — Encounter: Payer: Self-pay | Admitting: Emergency Medicine

## 2021-05-09 ENCOUNTER — Inpatient Hospital Stay: Payer: Medicare Other

## 2021-05-09 ENCOUNTER — Encounter: Payer: Self-pay | Admitting: Internal Medicine

## 2021-05-09 ENCOUNTER — Other Ambulatory Visit: Payer: Self-pay

## 2021-05-09 ENCOUNTER — Inpatient Hospital Stay: Payer: Medicare Other | Attending: Internal Medicine | Admitting: Internal Medicine

## 2021-05-09 DIAGNOSIS — Z87891 Personal history of nicotine dependence: Secondary | ICD-10-CM | POA: Insufficient documentation

## 2021-05-09 DIAGNOSIS — C3432 Malignant neoplasm of lower lobe, left bronchus or lung: Secondary | ICD-10-CM

## 2021-05-09 DIAGNOSIS — Z803 Family history of malignant neoplasm of breast: Secondary | ICD-10-CM | POA: Insufficient documentation

## 2021-05-09 DIAGNOSIS — N839 Noninflammatory disorder of ovary, fallopian tube and broad ligament, unspecified: Secondary | ICD-10-CM | POA: Diagnosis not present

## 2021-05-09 DIAGNOSIS — R200 Anesthesia of skin: Secondary | ICD-10-CM | POA: Insufficient documentation

## 2021-05-09 DIAGNOSIS — I2510 Atherosclerotic heart disease of native coronary artery without angina pectoris: Secondary | ICD-10-CM | POA: Diagnosis not present

## 2021-05-09 DIAGNOSIS — R202 Paresthesia of skin: Secondary | ICD-10-CM | POA: Diagnosis not present

## 2021-05-09 DIAGNOSIS — Z809 Family history of malignant neoplasm, unspecified: Secondary | ICD-10-CM | POA: Diagnosis not present

## 2021-05-09 DIAGNOSIS — J449 Chronic obstructive pulmonary disease, unspecified: Secondary | ICD-10-CM | POA: Insufficient documentation

## 2021-05-09 DIAGNOSIS — G8912 Acute post-thoracotomy pain: Secondary | ICD-10-CM | POA: Insufficient documentation

## 2021-05-09 MED ORDER — ACETAMINOPHEN-CODEINE #3 300-30 MG PO TABS: 1.0000 | ORAL_TABLET | Freq: Three times a day (TID) | ORAL | 0 refills | Status: DC | PRN

## 2021-05-09 NOTE — Assessment & Plan Note (Addendum)
#  Left lower lobe-adenocarcinoma stage I.  I reviewed the pathology and stage with patient in detail.  #Given stage I; overall prognosis is good.  Hence, any adjuvant chemotherapy is not recommended.  However I would recommend imaging CT scan every 6 months for the first 2 to 3 years; followed by annual imaging for total of 5 years.  # Post throacotomy pain/tingling and numbness:  continue-300 mg TID; and then at extra at night.  Recommend adding Tylenol #3; and along with Motrin  # Pain clinic- [Dr.Naveira] infrequenlt [sep 2021 script]; was supposed to be in cliinc.[22nd ].   # CAD/ PVD-clinically stable.  # COPD-stable encouraged continue to avoid smoking.  #Right ovarian mass [PET scan 2021] ~5.8 cm; followed by gynecology.  Awaiting repeat ultrasound in June.   #Clinical trial: Candidate for the [facility], discussed with the patient.  Information given.  Patient interested.  Clinical trial office will follow up with the patient  Thank you Dr. Roxan Hockey for allowing me to participate in the care of your pleasant patient. Please do not hesitate to contact me with questions or concerns in the interim.  # DISPOSITION: # follow up in 6 months- MD; labs- cbc/cmp; CT chest prior-Dr..B

## 2021-05-09 NOTE — Research (Signed)
[id number] - Customer service manager for the Discovery and Validation of Biomarkers for the Prediction, Diagnosis, and Management of Disease  Patient Laura Nielsen was identified by research staff as a potential candidate for the above listed study.  This Clinical Research Coordinator met with Jeremy Mclamb, [id number], on 05/09/21 in a manner and location that ensures patient privacy to discuss participation in the above listed research study.  Patient is Unaccompanied.  A copy of the informed consent document and separate HIPAA Authorization was provided to the patient.  Patient reads, speaks, and understands [place].    Patient was provided with the business card of Jeral Fruit, Clinical Research Nurse and encouraged to contact the research team with any questions.  Patient was provided the option of taking informed consent documents home to review and was encouraged to review at their convenience with their support network, including other care providers. Patient is comfortable with making a decision regarding study participation today.  As outlined in the informed consent form, this Coordinator and Eilish Mcdaniel discussed the purpose of the research study, the investigational nature of the study, study procedures and requirements for study participation, potential risks and benefits of study participation, as well as alternatives to participation. The patient understands participation is voluntary and they may withdraw from study participation at any time.  Patient understands enrollment is pending full eligibility review.   Confidentiality and how the patient's information will be used as part of study participation were discussed.  Patient was informed there is reimbursement provided for their time and effort spent on trial participation.  The patient is encouraged to discuss research study participation with their insurance provider to determine what costs they may incur as part of  study participation, including research related injury.    All questions were answered to patient's satisfaction.  The informed consent and separate HIPAA Authorization was reviewed page by page.  The patient's mental and emotional status is appropriate to provide informed consent, and the patient verbalizes an understanding of study participation.  Patient has agreed to participate in the above listed research study and has voluntarily signed the informed consent version 1 revised 03/07/2021,and separate HIPAA Authorization, version 5 on 05/09/21 at 1127 AM.  The patient was provided with a copy of the signed informed consent form and separate HIPAA Authorization for their reference.  No study specific procedures were obtained prior to the signing of the informed consent document.  Approximately 15 minutes were spent with the patient reviewing the informed consent documents.  Eligibility: This Coordinator has reviewed this patient's inclusion and exclusion criteria and confirmed Laura Nielsen is eligible for study participation.  Patient will continue with enrollment.  A second eligibility review was performed by Jeral Fruit, RN.  Plan:  The patient did not have labs drawn at today's visit.  Will plan to collect specimen at next visit.   Clabe Seal Clinical Research Coordinator I  05/09/21 11:52 AM

## 2021-05-09 NOTE — Progress Notes (Signed)
[facility] NOTE  Patient Care Team: Birdie Sons, MD as PCP - General (Family Medicine) Ubaldo Glassing Javier Docker, MD as Consulting Physician (Cardiology) Schnier, Dolores Lory, MD (Vascular Surgery) Vladimir Crofts, MD as Consulting Physician (Neurology) Milinda Pointer, MD as Referring Physician (Pain Medicine) Pa, [place] (Optometry) Ubaldo Glassing, Javier Docker, MD as Consulting Physician (Cardiology) Virgel Manifold, MD as Consulting Physician (Gastroenterology) Gae Dry, MD as Referring Physician (Obstetrics and Gynecology) Telford Nab, RN as Oncology Nurse Navigator   CHIEF COMPLAINTS/PURPOSE OF CONSULTATION: lung cancer  #  Oncology History Overview Note  # LCASP- LLL nodule 2.3 cm Adenocarcinoma Stage IA  Synchronous Tumors: Not applicable  Total Number of Primary Tumors: 1  Procedure: Wedge resection and lobectomy  Specimen Laterality: Left  Tumor Focality: Unifocal  Tumor Site: Lower lobe of lung  Tumor Size: 2.3 cm  Histologic Type: Acinar adenocarcinoma  Visceral Pleura Invasion: Not identified  Direct Invasion of Adjacent Structures: No adjacent structures present  Lymphovascular Invasion: Not identified  Margins: All margins negative for invasive carcinoma  Treatment Effect: No known presurgical therapy  Regional Lymph Nodes:       Number of Lymph Nodes Involved: 0    Primary cancer of left lower lobe of lung (Laura Nielsen)  05/09/2021 Initial Diagnosis   Primary cancer of left lower lobe of lung (H[facility])      HISTORY OF PRESENTING ILLNESS:  Laura Nielsen 65 y.o.  female history of smoking [currently quit] and with a new diagnosis of lung cancer is here for initial consultation/  Patient noted to have abnormal lung cancer screening CT scan in August 2021.  However on further follow-up the left lower lobe nodule was getting progressively swollen.  Even the PET scan December 2021 did not show any significant uptake; most recent CT scan noted to  have increasing size of the nodule.  Patient underwent left lower lobe robotic lobectomy.  No postoperative complications.  Denies any headaches.  Denies any nausea vomiting abdominal pain.  Review of Systems  Constitutional: Negative for chills, diaphoresis, fever, malaise/fatigue and weight loss.  HENT: Negative for nosebleeds and sore throat.   Eyes: Negative for double vision.  Respiratory: Negative for cough, hemoptysis, sputum production, shortness of breath and wheezing.   Cardiovascular: Positive for chest pain. Negative for palpitations, orthopnea and leg swelling.  Gastrointestinal: Negative for abdominal pain, blood in stool, constipation, diarrhea, heartburn, melena, nausea and vomiting.  Genitourinary: Negative for dysuria, frequency and urgency.  Musculoskeletal: Positive for back pain and joint pain.  Skin: Negative.  Negative for itching and rash.  Neurological: Positive for tingling. Negative for dizziness, focal weakness, weakness and headaches.  Endo/Heme/Allergies: Does not bruise/bleed easily.  Psychiatric/Behavioral: Negative for depression. The patient is not nervous/anxious and does not have insomnia.      MEDICAL HISTORY:  Past Medical History:  Diagnosis Date  . AAA (abdominal aortic aneurysm) (Laura Nielsen)    s/p EVAR AAA 03/27/13  . Allergy   . Anxiety   . Arthritis   . Asthma   . Complication of anesthesia    BP drops after  . Coronary artery disease   . Depression   . Diabetes mellitus without complication ([place])   . Dyspnea   . Emphysema of lung ([place])   . GERD (gastroesophageal reflux disease)   . History of kidney stones   . Hyperlipidemia   . Hypertension   . Osteoporosis   . Sleep apnea   . Tobacco abuse counseling  SURGICAL HISTORY: Past Surgical History:  Procedure Laterality Date  . ABDOMINAL AORTIC ENDOVASCULAR STENT GRAFT  03/27/2013   Dr. Hortencia Pilar  . Cardiac catheterization  02/2009   70-80% stenosis RCA stent placed. started  on Plavix  . CARDIAC CATHETERIZATION    . COLONOSCOPY WITH PROPOFOL N/A 01/05/2021   Procedure: COLONOSCOPY WITH PROPOFOL;  Surgeon: Virgel Manifold, MD;  Location: [facility] END[facility];  Service: Endoscopy;  Laterality: N/A;  . CORONARY ANGIOPLASTY     stent placed  . ESOPHAGOGASTRODUODENOSCOPY (EGD) WITH PROPOFOL N/A 01/05/2021   Procedure: ESOPHAGOGASTRODUODENOSCOPY (EGD) WITH PROPOFOL;  Surgeon: Virgel Manifold, MD;  Location: [facility] END[facility];  Service: Endoscopy;  Laterality: N/A;  . INTERCOSTAL NERVE BLOCK Left 04/06/2021   Procedure: INTERCOSTAL NERVE BLOCK;  Surgeon: Melrose Nakayama, MD;  Location: [place];  Service: Thoracic;  Laterality: Left;  . [place]  . NODE DISSECTION Left 04/06/2021   Procedure: NODE DISSECTION;  Surgeon: Melrose Nakayama, MD;  Location: [place];  Service: Thoracic;  Laterality: Left;  . TUBAL LIGATION    . VAGINAL HYSTERECTOMY     Menometrorrhagia. Excessive bleeding. Unknown if cervix removed.     SOCIAL HISTORY: Social History   Socioeconomic History  . Marital status: Divorced    Spouse name: Not on file  . Number of children: 2  . Years of education: H/S  . Highest education level: High school graduate  Occupational History  . Occupation: Disabled  . Occupation: retired  Tobacco Use  . Smoking status: Current Every Day Smoker    Packs/day: 0.25    Years: 44.50    Pack years: 11.12    Types: Cigarettes  . Smokeless tobacco: Never Used  . Tobacco comment: 12/23/19 states she quit for 3 months and then started back. Pt is going to talk with MD about this.  Vaping Use  . Vaping Use: Never used  Substance and Sexual Activity  . Alcohol use: Yes    Comment: rarely - once a year  . Drug use: No  . Sexual activity: Not on file  Other Topics Concern  . Not on file  Social History Narrative   Hx of smoking; quit prior to lung surgery. Lives in [place]- self. No alcohol. 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 On  disability sec to spinal pain.    Social Determinants of Health   Financial Resource Strain: Low Risk   . Difficulty of Paying Living Expenses: Not hard at all  Food Insecurity: No Food Insecurity  . Worried About Charity fundraiser in the Last Year: Never true  . Ran Out of Food in the Last Year: Never true  Transportation Needs: No Transportation Needs  . Lack of Transportation (Medical): No  . Lack of Transportation (Non-Medical): No  Physical Activity: Inactive  . Days of Exercise per Week: 0 days  . Minutes of Exercise per Session: 0 min  Stress: Stress Concern Present  . Feeling of Stress : To some extent  Social Connections: Socially Isolated  . Frequency of Communication with Friends and Family: More than three times a week  . Frequency of Social Gatherings with Friends and Family: Once a week  . Attends Religious Services: Never  . Active Member of Clubs or Organizations: No  . Attends Archivist Meetings: Never  . Marital Status: Divorced  Human resources officer Violence: Not At Risk  . Fear of Current or Ex-Partner: No  . Emotionally Abused: No  . Physically Abused:  No  . Sexually Abused: No    FAMILY HISTORY: Family History  Problem Relation Age of Onset  . Hypertension Mother   . Coronary artery disease Mother   . Heart attack Mother        acute  . Cancer Mother   . Alcohol abuse Father   . Depression Father   . Hypertension Father   . Heart attack Father 11       acute  . Alcohol abuse Sister   . Hyperlipidemia Sister   . Hypertension Sister   . Cancer Sister 16  . Heart attack Sister        x's 2  . Coronary artery disease Sister 46       x's 2  . Breast cancer Sister 90    ALLERGIES:  is allergic to atorvastatin, omeprazole, and bupropion.  MEDICATIONS:  Current Outpatient Medications  Medication Sig Dispense Refill  . acetaminophen (TYLENOL) 500 MG tablet Take 2 tablets (1,000 mg total) by mouth every 6 (six) hours. 30 tablet 0  .  acetaminophen-codeine (TYLENOL #3) 300-30 MG tablet Take 1 tablet by mouth every 8 (eight) hours as needed for moderate pain. 30 tablet 0  . albuterol (PROVENTIL HFA) 108 (90 Base) MCG/ACT inhaler Inhale 2 puffs into the lungs every 6 (six) hours as needed for wheezing or shortness of breath. 18 g 11  . allopurinol (ZYLOPRIM) 100 MG tablet Take 1 tablet (100 mg total) by mouth daily. 90 tablet 0  . aspirin 81 MG tablet Take 81 mg by mouth daily.     . bisoprolol (ZEBETA) 10 MG tablet Take 1 tablet (10 mg total) by mouth daily. 90 tablet 0  . Cholecalciferol 25 MCG (1000 UT) capsule Take 1,000 Units by mouth daily.    . clopidogrel (PLAVIX) 75 MG tablet Take 1 tablet (75 mg total) by mouth daily. 90 tablet 0  . cyanocobalamin 1000 MCG tablet Take 1,000 mcg by mouth daily.    Marland Kitchen gabapentin (NEURONTIN) 300 MG capsule Take 300-600 mg by mouth See admin instructions. 300 mg three times daily, will take an additional dose at bedtime if needed for pain    . ibuprofen (ADVIL) 200 MG tablet Take 400 mg by mouth 2 (two) times daily as needed (headaches).    Marland Kitchen lisinopril-hydrochlorothiazide (ZESTORETIC) 20-12.5 MG tablet Take 1 tablet by mouth daily. 90 tablet 0  . oxyCODONE (OXY IR/ROXICODONE) 5 MG immediate release tablet Take 1-2 tablets (5-10 mg total) by mouth every 4 (four) hours as needed for moderate pain. 30 tablet 0  . pantoprazole (PROTONIX) 20 MG tablet Take 1 tablet (20 mg total) by mouth daily. 90 tablet 1  . Polyethyl Glycol-Propyl Glycol (LUBRICATING EYE DROPS) 0.4-0.3 % SOLN Place 1 drop into both eyes daily as needed (dry eyes).    . rosuvastatin (CRESTOR) 20 MG tablet Take 1 tablet (20 mg total) by mouth daily. 90 tablet 0  . Semaglutide, 1 MG/DOSE, (OZEMPIC, 1 MG/DOSE,) 2 MG/1.5ML SOPN Inject 1 mg into the skin once a week. 2 pen 11   No current facility-administered medications for this visit.      Marland Kitchen  PHYSICAL EXAMINATION: ECOG PERFORMANCE STATUS: 1 - Symptomatic but completely  ambulatory  Vitals:   05/09/21 1041  BP: 127/65  Pulse: 69  Resp: 20  Temp: 98.3 F (36.8 C)  SpO2: 94%   Filed Weights   05/09/21 1041  Weight: 253 lb 3.2 oz (114.9 kg)    Physical Exam HENT:  Head: Normocephalic and atraumatic.     Mouth/Throat:     Pharynx: No oropharyngeal exudate.  Eyes:     Pupils: Pupils are equal, round, and reactive to light.  Cardiovascular:     Rate and Rhythm: Normal rate and regular rhythm.  Pulmonary:     Effort: No respiratory distress.     Breath sounds: No wheezing.  Abdominal:     General: Bowel sounds are normal. There is no distension.     Palpations: Abdomen is soft. There is no mass.     Tenderness: There is no abdominal tenderness. There is no guarding or rebound.  Musculoskeletal:        General: No tenderness. Normal range of motion.     Cervical back: Normal range of motion and neck supple.  Skin:    General: Skin is warm.  Neurological:     Mental Status: She is alert and oriented to person, place, and time.  Psychiatric:        Mood and Affect: Affect normal.      LABORATORY DATA:  I have reviewed the data as listed Lab Results  Component Value Date   WBC 8.7 04/11/2021   HGB 13.9 04/11/2021   HCT 42.2 04/11/2021   MCV 85.8 04/11/2021   PLT 205 04/11/2021   Recent Labs    01/25/21 1048 01/25/21 1048 04/04/21 1129 04/06/21 1451 04/07/21 0142 04/08/21 0026 04/11/21 0022  NA 141  --  138   < > 138 138 139  K 3.4*  --  3.5   < > 3.2* 3.5 3.8  CL 100  --  104   < > 107 105 104  CO2 22  --  21*  --  26 27 26   GLUCOSE 136*  --  105*   < > 134* 119* 101*  BUN 16  --  21   < > 21 16 17   CREATININE 0.78  --  0.69   < > 0.78 0.84 0.79  CALCIUM 10.0  --  9.6  --  8.7* 8.9 9.3  GFRNONAA 81   < > >60  --  >60 >60 >60  GFRAA 93  --   --   --   --   --   --   PROT 6.8  --  7.1  --   --  6.0* 6.0*  ALBUMIN 4.3  --  3.9  --   --  3.1* 3.0*  AST 24  --  34  --   --  26 25  ALT 25  --  28  --   --  18 22   ALKPHOS 115  --  82  --   --  59 92  BILITOT 0.7  --  1.0  --   --  0.5 0.9   < > = values in this interval not displayed.    RADIOGRAPHIC STUDIES: I have personally reviewed the radiological images as listed and agreed with the findings in the report. DG Chest 2 View  Result Date: 04/20/2021 CLINICAL DATA:  Status post left lower lobe lung nodule resection earlier this month. Diabetes. Asthma. COPD. EXAM: CHEST - 2 VIEW COMPARISON:  04/11/2021 FINDINGS: Patient rotated minimally left. Mild cardiomegaly. Atherosclerosis in the transverse aorta. No pleural effusion or pneumothorax. Low lung volumes. Persistent volume loss at the left lung base. No congestive failure. Clear right lung. IMPRESSION: No pneumothorax or other acute complication after left-sided nodule resection. Persistent volume loss at the left lung base. Aortic  Atherosclerosis (ICD10-I70.0). Cardiomegaly without congestive failure. Electronically Signed   By: Abigail Miyamoto M.D.   On: 04/20/2021 15:30   DG Chest 2 View  Result Date: 04/11/2021 CLINICAL DATA:  Atelectasis EXAM: CHEST - 2 VIEW COMPARISON:  Apr 09, 2021 FINDINGS: No edema or airspace opacity. Heart size and pulmonary vascularity are normal. No adenopathy. There is aortic atherosclerosis. No bone lesions. IMPRESSION: No edema or airspace opacity. Heart size within normal limits. Aortic Atherosclerosis (ICD10-I70.0). Electronically Signed   By: Lowella Grip III M.D.   On: 04/11/2021 08:09    ASSESSMENT & PLAN:   Primary cancer of left lower lobe of lung (HCC) #Left lower lobe-adenocarcinoma stage I.  I reviewed the pathology and stage with patient in detail.  #Given stage I; overall prognosis is good.  Hence, any adjuvant chemotherapy is not recommended.  However I would recommend imaging CT scan every 6 months for the first 2 to 3 years; followed by annual imaging for total of 5 years.  # Post throacotomy pain/tingling and numbness:  continue-300 mg TID; and then  at extra at night.  Recommend adding Tylenol #3; and along with Motrin  # Pain clinic- [Dr.Naveira] infrequenlt [sep 2021 script]; was supposed to be in cliinc.[22nd ].   # CAD/ PVD-clinically stable.  # COPD-stable encouraged continue to avoid smoking.  #Right ovarian mass [PET scan 2021] ~5.8 cm; followed by gynecology.  Awaiting repeat ultrasound in June.   #Clinical trial: Candidate for the [facility], discussed with the patient.  Information given.  Patient interested.  Clinical trial office will follow up with the patient  Thank you Dr. Roxan Hockey for allowing me to participate in the care of your pleasant patient. Please do not hesitate to contact me with questions or concerns in the interim.  # DISPOSITION: # follow up in 6 months- MD; labs- cbc/cmp; CT chest prior-Dr..B  All questions were answered. The patient knows to call the clinic with any problems, questions or concerns.    Laura Sickle, MD 05/09/2021 3:02 PM

## 2021-05-17 ENCOUNTER — Other Ambulatory Visit: Payer: Self-pay | Admitting: Family Medicine

## 2021-05-17 DIAGNOSIS — E119 Type 2 diabetes mellitus without complications: Secondary | ICD-10-CM

## 2021-05-24 ENCOUNTER — Other Ambulatory Visit: Payer: Self-pay

## 2021-05-24 DIAGNOSIS — Z79891 Long term (current) use of opiate analgesic: Secondary | ICD-10-CM | POA: Insufficient documentation

## 2021-05-24 DIAGNOSIS — C3432 Malignant neoplasm of lower lobe, left bronchus or lung: Secondary | ICD-10-CM

## 2021-05-24 NOTE — Research (Signed)
Research RN called patient today to possibly schedule a time for her to have her labs drawn for the [facility] 1694 protocol. This nurse left a message for the patient to return call at her earliest convenience, possibly draw labs when she has an MD visit here at [facility] in July.  Jeral Fruit, RN 05/24/21 2:14 PM

## 2021-05-24 NOTE — Progress Notes (Signed)
PROVIDER NOTE: Information contained herein reflects review and annotations entered in association with encounter. Interpretation of such information and data should be left to medically-trained personnel. Information provided to patient can be located elsewhere in the medical record under "Patient Instructions". Document created using STT-dictation technology, any transcriptional errors that may result from process are unintentional.    Patient: Laura Nielsen  Service Category: E/M  Provider: Francisco A Naveira, MD  DOB: 03/10/1956  DOS: 05/25/2021  Specialty: Interventional Pain Management  MRN: [id number]  Setting: Ambulatory outpatient  PCP: Fisher, Donald E, MD  Type: Established Patient    Referring Provider: Fisher, Donald E, MD  Location: Office  Delivery: Face-to-face     HPI  [name], a 65 y.o. year old female, is here today because of her Chronic pain syndrome [G89.4]. [name]'s primary complain today is Back Pain Last encounter: My last encounter with her was on Visit date not found. Pertinent problems: [name] has Arthritis; Narrowing of intervertebral disc space; Pins and needles sensation; Arthritis, degenerative; Peripheral blood vessel disorder (HCC); Ankle pain; Gout; Atherosclerosis of native arteries of extremity with intermittent claudication (HCC); Chronic pain syndrome; Chronic low back pain (1ry area of Pain) (Bilateral) (L>R) w/o sciatica; Chronic neck pain (2ry area of Pain) (Bilateral) (R>L); Cervicogenic headache (Right); Chronic upper back pain (3ry area of Pain) (Bilateral) (R>L); Chronic hip pain (Bilateral) (L>R); Osteoarthritis of hip (Bilateral) (L>R); Cervical central spinal stenosis; Cervical spondylosis with radiculopathy (Right) (C5); Lumbar spondylosis; Lumbar facet syndrome (Bilateral) (L>R); Lumbar facet hypertrophy (multilevel) (Bilateral); Neurogenic pain; Musculoskeletal pain; Abnormal MRI, lumbar spine (03/28/2017); Abnormal MRI, cervical spine  (03/28/2017); Unspecified inflammatory spondylopathy, lumbar region (HCC); Primary cancer of left lower lobe of lung (HCC); and Secondary osteoarthritis of multiple sites on their pertinent problem list. Pain Assessment: Severity of Chronic pain is reported as a 4 /10. Location: Back Lower, Mid/ . Onset: More than a month ago. Quality: Stabbing, Shooting, Aching. Timing: Constant. Modifying factor(s): meds, heat, ice, hot baths. Vitals:  height is 5' 8" (1.727 m) and weight is 253 lb (114.8 kg). Her temporal temperature is 96.8 F (36 C) (abnormal). Her blood pressure is 131/97 (abnormal) and her pulse is 66. Her respiration is 18 and oxygen saturation is 99%.   Reason for encounter: medication management. The patient indicates doing well with the current medication regimen. No adverse reactions or side effects reported to the medications.  He returns to the clinic today after having had her thoracic surgery to remove her lung cancer.  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  I have encouraged her to continue taking her gabapentin and I also talked to her about the possibility of talking to her primary care physician to perhaps increase that gabapentin by 1 pill every 14 days, to see if that can help with that neuropathic pain.  According to the patient's PMP her last prescription that she had failed from us was on 03/10/2020.  The 1 before that was on 12/24/2019, and the 1 before that was on 07/09/2019.  Each 1 of these prescriptions was for 15 tablets.  The last prescription that she had failed was from Govinda R. Brahmanday, MD for Tylenol 3 (#30 tablets) on 05/09/2021.  This means that she has been using an average of 2 tablets/month.  This patient was last seen on 08/19/2020 at which time I provided the patient with a single prescription for oxycodone IR 5 mg to   be taken p.o. daily (#15/mo).  According to the electronic medical record her  last prescription for an opioid analgesic was written by Elgie Collard, PA-C, on 04/11/2021 for oxycodone IR 5 mg (#30) with instructions to take 5-10 mg p.o. every 4 hours as needed for pain.  On the 08/19/2020 visit we informed the patient that we will be transferring then nonopioids to her PCP.  RTCB: 06/24/2021 Nonopioids transferred 08/19/2020: Gabapentin  Pharmacotherapy Assessment  Analgesic: Oxycodone IR 5 mg, 1 tab PO QD PRN (<5 mg/day of oxycodone) MME: <7.5 mg/day.   Monitoring: [place] PMP: PDMP reviewed during this encounter.       Pharmacotherapy: No side-effects or adverse reactions reported. Compliance: No problems identified. Effectiveness: Clinically acceptable.  Chauncey Fischer, RN  05/25/2021  1:01 PM  Sign when Signing Visit Safety precautions to be maintained throughout the outpatient stay will include: orient to surroundings, keep bed in low position, maintain call bell within reach at all times, provide assistance with transfer out of bed and ambulation.  Nursing Pain Medication Assessment:  Safety precautions to be maintained throughout the outpatient stay will include: orient to surroundings, keep bed in low position, maintain call bell within reach at all times, provide assistance with transfer out of bed and ambulation.  Medication Inspection Compliance: Pill count conducted under aseptic conditions, in front of the patient. Neither the pills nor the bottle was removed from the patient's sight at any time. Once count was completed pills were immediately returned to the patient in their original bottle.  Medication: Oxycodone IR Pill/Patch Count:  2 of 15 pills remain Pill/Patch Appearance: Markings consistent with prescribed medication Bottle Appearance: Standard pharmacy container. Clearly labeled. Filled Date: 4 / 7 /  2021 Last Medication intake:   5 weeks ago     UDS:  Summary  Date Value Ref Range Status  03/08/2017 FINAL  Final    Comment:     ==================================================================== [facility] COMP DRUG ANALYSIS,UR ==================================================================== Test                             Result       Flag       Units Drug Present and Declared for Prescription Verification   Gabapentin                     PRESENT      EXPECTED   Amitriptyline                  PRESENT      EXPECTED   Nortriptyline                  PRESENT      EXPECTED    Nortriptyline is an expected metabolite of amitriptyline.   Salicylate                     PRESENT      EXPECTED Drug Absent but Declared for Prescription Verification   Oxycodone                      Not Detected UNEXPECTED ng/mg creat   Bupropion                      Not Detected UNEXPECTED   Acetaminophen                  Not Detected UNEXPECTED  Acetaminophen, as indicated in the declared medication list, is    not always detected even when used as directed.   Ibuprofen                      Not Detected UNEXPECTED    Ibuprofen, as indicated in the declared medication list, is not    always detected even when used as directed. ==================================================================== Test                      Result    Flag   Units      Ref Range   Creatinine              160              mg/dL      >=20 ==================================================================== Declared Medications:  The flagging and interpretation on this report are based on the  following declared medications.  Unexpected results may arise from  inaccuracies in the declared medications.  **Note: The testing scope of this panel includes these medications:  Amitriptyline (Elavil)  Bupropion (Wellbutrin)  Gabapentin  Oxycodone (Oxycodone Acetaminophen)  **Note: The testing scope of this panel does not include small to  moderate amounts of these reported medications:  Acetaminophen (Oxycodone Acetaminophen)  Aspirin  Ibuprofen  **Note: The  testing scope of this panel does not include following  reported medications:  Albuterol  Allopurinol (Zyloprim)  Bisoprolol (Zebeta)  Clopidogrel (Plavix)  Cyanocobalamin  Lisinopril  Loratadine (Claritin)  Lovastatin (Mevacor)  Triamcinolone (Kenalog)  Vitamin C  Vitamin D ==================================================================== For clinical consultation, please call [phone]. ====================================================================      ROS  Constitutional: Denies any fever or chills Gastrointestinal: No reported hemesis, hematochezia, vomiting, or acute GI distress Musculoskeletal: Denies any acute onset joint swelling, redness, loss of ROM, or weakness Neurological: No reported episodes of acute onset apraxia, aphasia, dysarthria, agnosia, amnesia, paralysis, loss of coordination, or loss of consciousness  Medication Review  Cholecalciferol, Polyethyl Glycol-Propyl Glycol, Semaglutide (1 MG/DOSE), acetaminophen, acetaminophen-codeine, albuterol, allopurinol, aspirin, bisoprolol, clopidogrel, cyanocobalamin, gabapentin, ibuprofen, lisinopril-hydrochlorothiazide, oxyCODONE, pantoprazole, and rosuvastatin  History Review  Allergy: [name] is allergic to atorvastatin, omeprazole, and bupropion. Drug: [name]  reports no history of drug use. Alcohol:  reports current alcohol use. Tobacco:  reports that she has been smoking cigarettes. She has a 11.13 pack-year smoking history. She has never used smokeless tobacco. Social: [name]  reports that she has been smoking cigarettes. She has a 11.13 pack-year smoking history. She has never used smokeless tobacco. She reports current alcohol use. She reports that she does not use drugs. Medical:  has a past medical history of AAA (abdominal aortic aneurysm) ([place]), Allergy, Anxiety, Arthritis, Asthma, Complication of anesthesia, Coronary artery disease, Depression, Diabetes mellitus without complication  ([place]), Dyspnea, Emphysema of lung ([place]), GERD (gastroesophageal reflux disease), History of kidney stones, Hyperlipidemia, Hypertension, Osteoporosis, Sleep apnea, and Tobacco abuse counseling. Surgical: [name]  has a past surgical history that includes Cardiac catheterization (02/2009); Kidney stone surgery (1999); Vaginal hysterectomy; Tubal ligation; Cardiac catheterization; Coronary angioplasty; Colonoscopy with propofol (N/A, 01/05/2021); Esophagogastroduodenoscopy (egd) with propofol (N/A, 01/05/2021); Abdominal aortic endovascular stent graft (03/27/2013); Intercostal nerve block (Left, 04/06/2021); and Node dissection (Left, 04/06/2021). Family: family history includes Alcohol abuse in her father and sister; Breast cancer (age of onset: 26) in her sister; Cancer in her mother; Cancer (age of onset: 12) in her sister; Coronary artery disease in her mother; Coronary artery disease (age  of onset: 68) in her sister; Depression in her father; Heart attack in her mother and sister; Heart attack (age of onset: 32) in her father; Hyperlipidemia in her sister; Hypertension in her father, mother, and sister.  Laboratory Chemistry Profile   Renal Lab Results  Component Value Date   BUN 17 04/11/2021   CREATININE 0.79 04/11/2021   BCR 21 01/25/2021   GFRAA 93 01/25/2021   GFRNONAA >60 04/11/2021     Hepatic Lab Results  Component Value Date   AST 25 04/11/2021   ALT 22 04/11/2021   ALBUMIN 3.0 (L) 04/11/2021   ALKPHOS 92 04/11/2021   LIPASE 140 08/26/2012     Electrolytes Lab Results  Component Value Date   NA 139 04/11/2021   K 3.8 04/11/2021   CL 104 04/11/2021   CALCIUM 9.3 04/11/2021   MG 1.8 03/08/2017   PHOS 4.0 05/18/2016     Bone Lab Results  Component Value Date   VD25OH 28.6 (L) 01/25/2021   25OHVITD1 24 (L) 03/08/2017   25OHVITD2 8.2 03/08/2017   25OHVITD3 16 03/08/2017     Inflammation (CRP: Acute Phase) (ESR: Chronic Phase) Lab Results  Component Value Date   CRP  <0.8 03/08/2017   ESRSEDRATE 5 03/08/2017       Note: Above Lab results reviewed.  Recent Imaging Review  DG Chest 2 View CLINICAL DATA:  Status post left lower lobe lung nodule resection earlier this month. Diabetes. Asthma. COPD.  EXAM: CHEST - 2 VIEW  COMPARISON:  04/11/2021  FINDINGS: Patient rotated minimally left. Mild cardiomegaly. Atherosclerosis in the transverse aorta. No pleural effusion or pneumothorax. Low lung volumes. Persistent volume loss at the left lung base. No congestive failure. Clear right lung.  IMPRESSION: No pneumothorax or other acute complication after left-sided nodule resection.  Persistent volume loss at the left lung base.  Aortic Atherosclerosis (ICD10-I70.0).  Cardiomegaly without congestive failure.  Electronically Signed   By: Abigail Miyamoto M.D.   On: 04/20/2021 15:30 Note: Reviewed        Physical Exam  General appearance: Well nourished, well developed, and well hydrated. In no apparent acute distress Mental status: Alert, oriented x 3 (person, place, & time)       Respiratory: No evidence of acute respiratory distress Eyes: PERLA Vitals: BP (!) 131/97   Pulse 66   Temp (!) 96.8 F (36 C) (Temporal)   Resp 18   Ht 5' 8" (1.727 m)   Wt 253 lb (114.8 kg)   SpO2 99%   BMI 38.47 kg/m  BMI: Estimated body mass index is 38.47 kg/m as calculated from the following:   Height as of this encounter: 5' 8" (1.727 m).   Weight as of this encounter: 253 lb (114.8 kg). Ideal: Ideal body weight: 63.9 kg (140 lb 14 oz) Adjusted ideal body weight: 84.2 kg (185 lb 11.6 oz)  Assessment   Status Diagnosis  Controlled Controlled Controlled 1. Chronic pain syndrome   2. Chronic low back pain (1ry area of Pain) (Bilateral) (L>R) w/o sciatica   3. Chronic neck pain (2ry area of Pain) (Bilateral) (R>L)   4. Chronic upper back pain (3ry area of Pain) (Bilateral) (R>L)   5. Primary cancer of left lower lobe of lung ([place])   6. Severe  obesity (BMI 35.0-39.9) with comorbidity ([place])   7. Secondary osteoarthritis of multiple sites   8. Pharmacologic therapy   9. Chronic use of opiate for therapeutic purpose   10. Encounter for chronic pain management  Updated Problems: Problem  Secondary Osteoarthritis of Multiple Sites  Primary Cancer of Left Lower Lobe of Lung (Hcc)  Chronic low back pain (1ry area of Pain) (Bilateral) (L>R) w/o sciatica  Chronic neck pain (2ry area of Pain) (Bilateral) (R>L)  Chronic upper back pain (3ry area of Pain) (Bilateral) (R>L)  Atherosclerosis of Native Arteries of Extremity With Intermittent Claudication (Hcc)  Arthritis  Peripheral blood vessel disorder (HCC)   Followed by Dr. 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    Claudication [facility])   Stent in infrarenal aorta 03/2013.    CAFL (chronic airflow limitation) (HCC)  Obstructive Apnea   CPAP. Uses it regularly.    Severe obesity (BMI 35.0-39.9) with comorbidity ([place])  AAA (abdominal aortic aneurysm) without rupture (HCC)   Overview:  S/p AAA stent per Dr. Delana Meyer  Last Assessment & Plan:  Relevant Hx: Course: Daily Update: Today's Plan:    Aortic Heart Valve Narrowing   Aortic Stricture or Stenosis found by Dr. Hortencia Pilar.  This needs to be monitored closely by Dr. Delana Meyer.  01/31/13.    Cad in Native Artery   S/P pci os rca with 3.0 x 18 mm Xience stent. Sees Dr. Ubaldo Glassing.     Airway Hyperreactivity  Smoking Greater Than 30 Pack Years  Dysphagia  Gastric Erythema  Columnar Epithelial-Lined Lower Esophagus  Polyp of Colon  Positive Colorectal Cancer Screening Using Cologuard Test  Complex Ovarian Cyst  Ovarian Cyst, Right  Fatty Liver   Per LDCT    Recurrent Major  Depressive Disorder, in Partial Remission (Hcc)  Aortic Atherosclerosis (Hcc)   Per ldct    Clinical Depression   zoloft    Emphysema Lung (Hcc)   Mild per LDCT    Acid Reflux  History of Abnormal Cervical Papanicolaou Smear   as stated with cryotherapy.    Malaise and Fatigue  Diabetes Mellitus, Type 2 (Hcc)   Monofilament: normal 03/31/15    Hypercholesteremia  Allergic Rhinitis  Benign Essential Htn  Acute Exacerbation of Chronic Obstructive Airways Disease (Hcc) (Resolved)    Plan of Care  Problem-specific:  No problem-specific Assessment & Plan notes found for this encounter.  [name] has a current medication list which includes the following long-term medication(s): albuterol, allopurinol, bisoprolol, gabapentin, lisinopril-hydrochlorothiazide, oxycodone, oxycodone, pantoprazole, and rosuvastatin.  Pharmacotherapy (Medications Ordered): Meds ordered this encounter  Medications   oxyCODONE (OXY IR/ROXICODONE) 5 MG immediate release tablet    Sig: Take 1 tablet (5 mg total) by mouth every 6 (six) hours as needed for severe pain. Must last 30 days.    Dispense:  10 tablet    Refill:  0    Not a duplicate. Do NOT delete! Dispense 1 day early if closed on refill date. Avoid benzodiazepines within 8 hours of opioids. Do not send refill requests.    Orders:  Orders Placed This Encounter  Procedures   ToxASSURE Select 13 (MW), Urine    Volume: 30 ml(s). Minimum 3 ml of urine is needed. Document temperature of fresh sample. Indications: Long term (current) use of opiate analgesic (N82.956)    Order Specific Question:   Release to patient    Answer:   Immediate    Follow-up plan:   Return in about 1 month (around  06/24/2021) for evaluation day (F2F) (MM).     Interventional Therapies  Risk  Complexity Considerations:   Estimated body mass index is 38.47 kg/m as calculated from the following:   Height as of this encounter: 5' 8" (1.727 m).   Weight as  of this encounter: 253 lb (114.8 kg). PLAVIX Anticoagulation (Stop: 7-10 days  Restart: 2 hours)  Chronic smoker; lung cancer; COPD; airway hyperreactivity; obstructive sleep apnea Coronary artery disease; carotid artery disease; AAA; peripheral vascular disease; hypertension; arctic atherosclerosis; arctic valve narrowing.   Planned  Pending:   Pending further evaluation   Under consideration:   Diagnostic bilateral lumbar facet block #2  Possible bilateral lumbar facet RFA  Diagnostic right CESI  Diagnostic bilateral cervical facet block  Possible bilateral cervical facet RFA  Diagnostic IA hip joint injection  Diagnostic bilateral femoral and obturator NB  Possible bilateral femoral and obturator nerve RFA  Diagnostic right occipital NB  Possible right occipital nerve RFA  Possible right occipital peripheral nerve stimulator trial    Completed:   Diagnostic/therapeutic bilateral lumbar facet block x1    Therapeutic  Palliative (PRN) options:   Diagnostic/therapeutic bilateral lumbar facet block #2     Recent Visits No visits were found meeting these conditions. Showing recent visits within past 90 days and meeting all other requirements Today's Visits Date Type Provider Dept  05/25/21 Office Visit Naveira, Francisco, MD [facility]  Showing today's visits and meeting all other requirements Future Appointments No visits were found meeting these conditions. Showing future appointments within next 90 days and meeting all other requirements I discussed the assessment and treatment plan with the patient. The patient was provided an opportunity to ask questions and all were answered. The patient agreed with the plan and demonstrated an understanding of the instructions.  Patient advised to call back or seek an in-person evaluation if the symptoms or condition worsens.  Duration of encounter: 30 minutes.  Note by: Francisco A Naveira, MD Date: 05/25/2021; Time:  1:44 PM  

## 2021-05-25 ENCOUNTER — Inpatient Hospital Stay: Payer: Medicare Other

## 2021-05-25 ENCOUNTER — Ambulatory Visit: Payer: Self-pay | Admitting: Family Medicine

## 2021-05-25 ENCOUNTER — Encounter: Payer: Self-pay | Admitting: Pain Medicine

## 2021-05-25 ENCOUNTER — Telehealth: Payer: Self-pay

## 2021-05-25 ENCOUNTER — Ambulatory Visit: Payer: Medicare Other | Attending: Pain Medicine | Admitting: Pain Medicine

## 2021-05-25 ENCOUNTER — Other Ambulatory Visit: Payer: Self-pay

## 2021-05-25 VITALS — BP 131/97 | HR 66 | Temp 96.8°F | Resp 18 | Ht 68.0 in | Wt 253.0 lb

## 2021-05-25 DIAGNOSIS — M542 Cervicalgia: Secondary | ICD-10-CM | POA: Diagnosis not present

## 2021-05-25 DIAGNOSIS — M153 Secondary multiple arthritis: Secondary | ICD-10-CM | POA: Diagnosis not present

## 2021-05-25 DIAGNOSIS — G8929 Other chronic pain: Secondary | ICD-10-CM

## 2021-05-25 DIAGNOSIS — Z79899 Other long term (current) drug therapy: Secondary | ICD-10-CM | POA: Diagnosis not present

## 2021-05-25 DIAGNOSIS — G894 Chronic pain syndrome: Secondary | ICD-10-CM | POA: Diagnosis not present

## 2021-05-25 DIAGNOSIS — C3432 Malignant neoplasm of lower lobe, left bronchus or lung: Secondary | ICD-10-CM | POA: Diagnosis not present

## 2021-05-25 DIAGNOSIS — N83201 Unspecified ovarian cyst, right side: Secondary | ICD-10-CM | POA: Diagnosis not present

## 2021-05-25 DIAGNOSIS — M545 Low back pain, unspecified: Secondary | ICD-10-CM | POA: Diagnosis not present

## 2021-05-25 DIAGNOSIS — M546 Pain in thoracic spine: Secondary | ICD-10-CM

## 2021-05-25 DIAGNOSIS — Z79891 Long term (current) use of opiate analgesic: Secondary | ICD-10-CM | POA: Diagnosis not present

## 2021-05-25 MED ORDER — OXYCODONE HCL 5 MG PO TABS: 5.0000 mg | ORAL_TABLET | Freq: Four times a day (QID) | ORAL | 0 refills | Status: DC | PRN

## 2021-05-25 NOTE — Research (Signed)
[facility] Study Visit:   Patient in to the cancer center this morning for her scheduled lab visit to obtain specimens for the [facility] 1694 protocol. Her labs were drawn at 1115 am without any difficulty by peripheral stick by [employer]. Crystal Barlow, CRS obtained the tubes and labeled them with the time and date of the blood draw. The patient was provided with her $50 gift card and instructions to activate and use it. The patient signed receipt of her gift card. The patient was thanked for her participation in the study today. Approximately 20 minutes were spent with this patient for research activity completion.  Jeral Fruit, RN 05/25/21 11:34 AM

## 2021-05-25 NOTE — Progress Notes (Signed)
Safety precautions to be maintained throughout the outpatient stay will include: orient to surroundings, keep bed in low position, maintain call bell within reach at all times, provide assistance with transfer out of bed and ambulation.  Nursing Pain Medication Assessment:  Safety precautions to be maintained throughout the outpatient stay will include: orient to surroundings, keep bed in low position, maintain call bell within reach at all times, provide assistance with transfer out of bed and ambulation.  Medication Inspection Compliance: Pill count conducted under aseptic conditions, in front of the patient. Neither the pills nor the bottle was removed from the patient's sight at any time. Once count was completed pills were immediately returned to the patient in their original bottle.  Medication: Oxycodone IR Pill/Patch Count:  2 of 15 pills remain Pill/Patch Appearance: Markings consistent with prescribed medication Bottle Appearance: Standard pharmacy container. Clearly labeled. Filled Date: 4 / 7 /  2021 Last Medication intake:   5 weeks ago

## 2021-05-25 NOTE — Telephone Encounter (Signed)
Research nurse received call from the patient this morning inquiring about her lab appointment visit for today. She was informed that the time had been scheduled for 11:30 am today in the cancer center. Research will meet with patient at that time in registration and escort her to her destination.  Jeral Fruit, RN 05/25/21 8:38 AM

## 2021-05-25 NOTE — Patient Instructions (Signed)
______________________________________________________________________________________________  Body mass index (BMI)  Body mass index (BMI) is a common tool for deciding whether a person has an appropriate body weight.  It measures a persons weight in relation to their height.   According to the [facility] of health (NIH): A BMI of less than 18.5 means that a person is underweight. A BMI of between 18.5 and 24.9 is ideal. A BMI of between 25 and 29.9 is overweight. A BMI over 30 indicates obesity.  Weight Management Required  URGENT: Your weight has been found to be adversely affecting your health.  Dear Ms. Gorley:  Your current Estimated body mass index is 38.5 kg/m as calculated from the following:   Height as of 04/20/21: 5' 8"  (1.727 m).   Weight as of 05/09/21: 253 lb 3.2 oz (114.9 kg).  Please use the table below to identify your weight category and associated incidence of chronic pain, secondary to your weight.  Body Mass Index (BMI) Classification BMI level (kg/m2) Category Associated incidence of chronic pain  <18  Underweight   18.5-24.9 Ideal body weight   25-29.9 Overweight  20%  30-34.9 Obese (Class I)  68%  35-39.9 Severe obesity (Class II)  136%  >40 Extreme obesity (Class III)  254%   In addition: You will be considered "Morbidly Obese", if your BMI is above 30 and you have one or more of the following conditions which are known to be caused and/or directly associated with obesity: 1.    Type 2 Diabetes (Which in turn can lead to cardiovascular diseases (CVD), stroke, peripheral vascular diseases (PVD), retinopathy, nephropathy, and neuropathy) 2.    Cardiovascular Disease (High Blood Pressure; Congestive Heart Failure; High Cholesterol; Coronary Artery Disease; Angina; or History of Heart Attacks) 3.    Breathing problems (Asthma; obesity-hypoventilation syndrome; obstructive sleep apnea; chronic inflammatory airway disease; reactive airway disease; or  shortness of breath) 4.    Chronic kidney disease 5.    Liver disease (nonalcoholic fatty liver disease) 6.    High blood pressure 7.    Acid reflux (gastroesophageal reflux disease; heartburn) 8.    Osteoarthritis (OA) (with any of the following: hip pain; knee pain; and/or low back pain) 9.    Low back pain (Lumbar Facet Syndrome; and/or Degenerative Disc Disease) 10.  Hip pain (Osteoarthritis of hip) (For every 1 lbs of added body weight, there is a 2 lbs increase in pressure inside of each hip articulation. 1:2 mechanical relationship) 11.  Knee pain (Osteoarthritis of knee) (For every 1 lbs of added body weight, there is a 4 lbs increase in pressure inside of each knee articulation. 1:4 mechanical relationship) (patients with a BMI>30 kg/m2 were 6.8 times more likely to develop knee OA than normal-weight individuals) 12.  Cancer: Epidemiological studies have shown that obesity is a risk factor for: post-menopausal breast cancer; cancers of the endometrium, colon and kidney cancer; malignant adenomas of the oesophagus. Obese subjects have an approximately 1.5-3.5-fold increased risk of developing these cancers compared with normal-weight subjects, and it has been estimated that between 15 and 45% of these cancers can be attributed to overweight. More recent studies suggest that obesity may also increase the risk of other types of cancer, including pancreatic, hepatic and gallbladder cancer. (Ref: Obesity and cancer. Pischon T, Nthlings U, [employer] H. Proc Nutr Soc. 2008 May;67(2):128-45. doi: 07.6808/[id number].) The International Agency for Research on Cancer (IARC) has identified 13 cancers associated with overweight and obesity: meningioma, multiple myeloma, adenocarcinoma of the esophagus, and cancers of  the thyroid, postmenopausal breast cancer, gallbladder, stomach, liver, pancreas, kidney, ovaries, uterus, colon and rectal (colorectal) cancers. 40 percent of all cancers diagnosed in women  and 24 percent of those diagnosed in men are associated with overweight and obesity.  Recommendation: At this point it is urgent that you take a step back and concentrate in loosing weight. Dedicate 100% of your efforts on this task. Nothing else will improve your health more than bringing your weight down and your BMI to less than 30. If you are here, you probably have chronic pain. We know that most chronic pain patients have difficulty exercising secondary to their pain. For this reason, you must rely on proper nutrition and diet in order to lose the weight. If your BMI is above 40, you should seriously consider bariatric surgery. A realistic goal is to lose 10% of your body weight over a period of 12 months.  Be honest to yourself, if over time you have unsuccessfully tried to lose weight, then it is time for you to seek professional help and to enter a medically supervised weight management program, and/or undergo bariatric surgery. Stop procrastinating.   Pain management considerations:  1.    Pharmacological Problems: Be advised that the use of opioid analgesics (oxycodone; hydrocodone; morphine; methadone; codeine; and all of their derivatives) have been associated with decreased metabolism and weight gain.  For this reason, should we see that you are unable to lose weight while taking these medications, it may become necessary for [place] to taper down and indefinitely discontinue them.  2.    Technical Problems: The incidence of successful interventional therapies decreases as the patient's BMI increases. It is much more difficult to accomplish a safe and effective interventional therapy on a patient with a BMI above 35. 3.    Radiation Exposure Problems: The x-rays machine, used to accomplish injection therapies, will automatically increase their x-ray output in order to capture an appropriate bone image. This means that radiation exposure increases exponentially with the patient's BMI. (The higher the  BMI, the higher the radiation exposure.) Although the level of radiation used at a given time is still safe to the patient, it is not for the physician and/or assisting staff. Unfortunately, radiation exposure is accumulative. Because physicians and the staff have to do procedures and be exposed on a daily basis, this can result in health problems such as cancer and radiation burns. Radiation exposure to the staff is monitored by the radiation batches that they wear. The exposure levels are reported back to the staff on a quarterly basis. Depending on levels of exposure, physicians and staff may be obligated by law to decrease this exposure. This means that they have the right and obligation to refuse providing therapies where they may be overexposed to radiation. For this reason, physicians may decline to offer therapies such as radiofrequency ablation or implants to patients with a BMI above 40. 4.    Current Trends: Be advised that the current trend is to no longer offer certain therapies to patients with a BMI equal to, or above 35, due to increase perioperative risks, increased technical procedural difficulties, and excessive radiation exposure to healthcare personnel.  ______________________________________________________________________________________________   ____________________________________________________________________________________________  Medication Rules  Purpose: To inform patients, and their family members, of our rules and regulations.  Applies to: All patients receiving prescriptions (written or electronic).  Pharmacy of record: Pharmacy where electronic prescriptions will be sent. If written prescriptions are taken to a different pharmacy, please inform the nursing  staff. The pharmacy listed in the electronic medical record should be the one where you would like electronic prescriptions to be sent.  Electronic prescriptions: In compliance with the [place] (STOP) Act of 2017 (Session Lanny Cramp [phone]), effective December 04, 2018, all controlled substances must be electronically prescribed. Calling prescriptions to the pharmacy will cease to exist.  Prescription refills: Only during scheduled appointments. Applies to all prescriptions.  NOTE: The following applies primarily to controlled substances (Opioid* Pain Medications).   Type of encounter (visit): For patients receiving controlled substances, face-to-face visits are required. (Not an option or up to the patient.)  Patient's responsibilities: Pain Pills: Bring all pain pills to every appointment (except for procedure appointments). Pill Bottles: Bring pills in original pharmacy bottle. Always bring the newest bottle. Bring bottle, even if empty. Medication refills: You are responsible for knowing and keeping track of what medications you take and those you need refilled. The day before your appointment: write a list of all prescriptions that need to be refilled. The day of the appointment: give the list to the admitting nurse. Prescriptions will be written only during appointments. No prescriptions will be written on procedure days. If you forget a medication: it will not be "Called in", "Faxed", or "electronically sent". You will need to get another appointment to get these prescribed. No early refills. Do not call asking to have your prescription filled early. Prescription Accuracy: You are responsible for carefully inspecting your prescriptions before leaving our office. Have the discharge nurse carefully go over each prescription with you, before taking them home. Make sure that your name is accurately spelled, that your address is correct. Check the name and dose of your medication to make sure it is accurate. Check the number of pills, and the written instructions to make sure they are clear and accurate. Make sure that you are given enough medication to  last until your next medication refill appointment. Taking Medication: Take medication as prescribed. When it comes to controlled substances, taking less pills or less frequently than prescribed is permitted and encouraged. Never take more pills than instructed. Never take medication more frequently than prescribed.  Inform other Doctors: Always inform, all of your healthcare providers, of all the medications you take. Pain Medication from other Providers: You are not allowed to accept any additional pain medication from any other Doctor or Healthcare provider. There are two exceptions to this rule. (see below) In the event that you require additional pain medication, you are responsible for notifying us, as stated below. Cough Medicine: Often these contain an opioid, such as codeine or hydrocodone. Never accept or take cough medicine containing these opioids if you are already taking an opioid* medication. The combination may cause respiratory failure and death. Medication Agreement: You are responsible for carefully reading and following our Medication Agreement. This must be signed before receiving any prescriptions from our practice. Safely store a copy of your signed Agreement. Violations to the Agreement will result in no further prescriptions. (Additional copies of our Medication Agreement are available upon request.) Laws, Rules, & Regulations: All patients are expected to follow all Federal and [employer], [employer], Rules, [employer]. Ignorance of the Laws does not constitute a valid excuse.  Illegal drugs and Controlled Substances: The use of illegal substances (including, but not limited to marijuana and its derivatives) and/or the illegal use of any controlled substances is strictly prohibited. Violation of this rule may result in the immediate and permanent discontinuation of  any and all prescriptions being written by our practice. The use of any illegal substances is prohibited. Adopted  CDC guidelines & recommendations: Target dosing levels will be at or below 60 MME/day. Use of benzodiazepines** is not recommended.  Exceptions: There are only two exceptions to the rule of not receiving pain medications from other Healthcare Providers. Exception #1 (Emergencies): In the event of an emergency (i.e.: accident requiring emergency care), you are allowed to receive additional pain medication. However, you are responsible for: As soon as you are able, call our office [phone], at any time of the day or night, and leave a message stating your name, the date and nature of the emergency, and the name and dose of the medication prescribed. In the event that your call is answered by a member of our staff, make sure to document and save the date, time, and the name of the person that took your information.  Exception #2 (Planned Surgery): In the event that you are scheduled by another doctor or dentist to have any type of surgery or procedure, you are allowed (for a period no longer than 30 days), to receive additional pain medication, for the acute post-op pain. However, in this case, you are responsible for picking up a copy of our "Post-op Pain Management for Surgeons" handout, and giving it to your surgeon or dentist. This document is available at our office, and does not require an appointment to obtain it. Simply go to our office during business hours (Monday-Thursday from 8:00 AM to 4:00 PM) (Friday 8:00 AM to 12:00 Noon) or if you have a scheduled appointment with [place], prior to your surgery, and ask for it by name. In addition, you are responsible for: calling our office [phone], at any time of the day or night, and leaving a message stating your name, name of your surgeon, type of surgery, and date of procedure or surgery. Failure to comply with your responsibilities may result in termination of therapy involving the controlled substances.  *Opioid medications include: morphine,  codeine, oxycodone, oxymorphone, hydrocodone, hydromorphone, meperidine, tramadol, tapentadol, buprenorphine, fentanyl, methadone. **Benzodiazepine medications include: diazepam (Valium), alprazolam (Xanax), clonazepam (Klonopine), lorazepam (Ativan), clorazepate (Tranxene), chlordiazepoxide (Librium), estazolam (Prosom), oxazepam (Serax), temazepam (Restoril), triazolam (Halcion) (Last updated: 11/01/2020) ____________________________________________________________________________________________  ____________________________________________________________________________________________  Medication Recommendations and Reminders  Applies to: All patients receiving prescriptions (written and/or electronic).  Medication Rules & Regulations: These rules and regulations exist for your safety and that of others. They are not flexible and neither are we. Dismissing or ignoring them will be considered "non-compliance" with medication therapy, resulting in complete and irreversible termination of such therapy. (See document titled "Medication Rules" for more details.) In all conscience, because of safety reasons, we cannot continue providing a therapy where the patient does not follow instructions.  Pharmacy of record:  Definition: This is the pharmacy where your electronic prescriptions will be sent.  We do not endorse any particular pharmacy, however, we have experienced problems with [facility] not securing enough medication supply for the community. We do not restrict you in your choice of pharmacy. However, once we write for your prescriptions, we will NOT be re-sending more prescriptions to fix restricted supply problems created by your pharmacy, or your insurance.  The pharmacy listed in the electronic medical record should be the one where you want electronic prescriptions to be sent. If you choose to change pharmacy, simply notify our nursing staff.  Recommendations: Keep all of your pain  medications in a safe place,  under lock and key, even if you live alone. We will NOT replace lost, stolen, or damaged medication. After you fill your prescription, take 1 week's worth of pills and put them away in a safe place. You should keep a separate, properly labeled bottle for this purpose. The remainder should be kept in the original bottle. Use this as your primary supply, until it runs out. Once it's gone, then you know that you have 1 week's worth of medicine, and it is time to come in for a prescription refill. If you do this correctly, it is unlikely that you will ever run out of medicine. To make sure that the above recommendation works, it is very important that you make sure your medication refill appointments are scheduled at least 1 week before you run out of medicine. To do this in an effective manner, make sure that you do not leave the office without scheduling your next medication management appointment. Always ask the nursing staff to show you in your prescription , when your medication will be running out. Then arrange for the receptionist to get you a return appointment, at least 7 days before you run out of medicine. Do not wait until you have 1 or 2 pills left, to come in. This is very poor planning and does not take into consideration that we may need to cancel appointments due to bad weather, sickness, or emergencies affecting our staff. DO NOT ACCEPT A "Partial Fill": If for any reason your pharmacy does not have enough pills/tablets to completely fill or refill your prescription, do not allow for a "partial fill". The law allows the pharmacy to complete that prescription within 72 hours, without requiring a new prescription. If they do not fill the rest of your prescription within those 72 hours, you will need a separate prescription to fill the remaining amount, which we will NOT provide. If the reason for the partial fill is your insurance, you will need to talk to the pharmacist  about payment alternatives for the remaining tablets, but again, DO NOT ACCEPT A PARTIAL FILL, unless you can trust your pharmacist to obtain the remainder of the pills within 72 hours.  Prescription refills and/or changes in medication(s):  Prescription refills, and/or changes in dose or medication, will be conducted only during scheduled medication management appointments. (Applies to both, written and electronic prescriptions.) No refills on procedure days. No medication will be changed or started on procedure days. No changes, adjustments, and/or refills will be conducted on a procedure day. Doing so will interfere with the diagnostic portion of the procedure. No phone refills. No medications will be "called into the pharmacy". No Fax refills. No weekend refills. No Holliday refills. No after hours refills.  Remember:  Business hours are:  Monday to Thursday 8:00 AM to 4:00 PM Provider's Schedule: Milinda Pointer, MD - Appointments are:  Medication management: Monday and Wednesday 8:00 AM to 4:00 PM Procedure day: Tuesday and Thursday 7:30 AM to 4:00 PM Gillis Santa, MD - Appointments are:  Medication management: Tuesday and Thursday 8:00 AM to 4:00 PM Procedure day: Monday and Wednesday 7:30 AM to 4:00 PM (Last update: 06/23/2020) ____________________________________________________________________________________________  ____________________________________________________________________________________________  CBD (cannabidiol) WARNING  Applicable to: All individuals currently taking or considering taking CBD (cannabidiol) and, more important, all patients taking opioid analgesic controlled substances (pain medication). (Example: oxycodone; oxymorphone; hydrocodone; hydromorphone; morphine; methadone; tramadol; tapentadol; fentanyl; buprenorphine; butorphanol; dextromethorphan; meperidine; codeine; etc.)  Legal status: CBD remains a Schedule I drug prohibited for any  use. CBD  is illegal with one exception. In the [place], CBD has a limited Transport planner (FDA) approval for the treatment of two specific types of epilepsy disorders. Only one CBD product has been approved by the FDA for this purpose: "Epidiolex". FDA is aware that some companies are marketing products containing cannabis and cannabis-derived compounds in ways that violate the [employer], Drug and Cosmetic Act [facility] Act) and that may put the health and safety of consumers at risk. The FDA, a Federal agency, has not enforced the CBD status since 2018.   Legality: Some manufacturers ship CBD products nationally, which is illegal. Often such products are sold online and are therefore available throughout the country. CBD is openly sold in head shops and health food stores in some states where such sales have not been explicitly legalized. Selling unapproved products with unsubstantiated therapeutic claims is not only a violation of the law, but also can put patients at risk, as these products have not been proven to be safe or effective. Federal illegality makes it difficult to conduct research on CBD.  Reference: "FDA Regulation of Cannabis and Cannabis-Derived Products, Including Cannabidiol (CBD)" - [employer]  Warning: CBD is not FDA approved and has not undergo the same manufacturing controls as prescription drugs.  This means that the purity and safety of available CBD may be questionable. Most of the time, despite manufacturer's claims, it is contaminated with THC (delta-9-tetrahydrocannabinol - the chemical in marijuana responsible for the "HIGH").  When this is the case, the [facility] contaminant will trigger a positive urine drug screen (UDS) test for Marijuana (carboxy-THC). Because a positive UDS for any illicit substance is a violation of our medication agreement, your  opioid analgesics (pain medicine) may be permanently discontinued.  MORE ABOUT CBD  General Information: CBD  is a derivative of the Marijuana (cannabis sativa) plant discovered in 80. It is one of the 113 identified substances found in Marijuana. It accounts for up to 40% of the plant's extract. As of 2018, preliminary clinical studies on CBD included research for the treatment of anxiety, movement disorders, and pain. CBD is available and consumed in multiple forms, including inhalation of smoke or vapor, as an aerosol spray, and by mouth. It may be supplied as an oil containing CBD, capsules, dried cannabis, or as a liquid solution. CBD is thought not to be as psychoactive as THC (delta-9-tetrahydrocannabinol - the chemical in marijuana responsible for the "HIGH"). Studies suggest that CBD may interact with different biological target receptors in the body, including cannabinoid and other neurotransmitter receptors. As of 2018 the mechanism of action for its biological effects has not been determined.  Side-effects  Adverse reactions: Dry mouth, diarrhea, decreased appetite, fatigue, drowsiness, malaise, weakness, sleep disturbances, and others.  Drug interactions: CBC may interact with other medications such as blood-thinners. (Last update: 07/10/2020) ____________________________________________________________________________________________  ____________________________________________________________________________________________  Drug Holidays (Slow)  What is a "Drug Holiday"? Drug Holiday: is the name given to the period of time during which a patient stops taking a medication(s) for the purpose of eliminating tolerance to the drug.  Benefits Improved effectiveness of opioids. Decreased opioid dose needed to achieve benefits. Improved pain with lesser dose.  What is tolerance? Tolerance: is the progressive decreased in effectiveness of a drug due to its repetitive use. With  repetitive use, the body gets use to the medication and as a consequence, it loses its effectiveness. This is a common problem seen with opioid pain medications. As a result,  a larger dose of the drug is needed to achieve the same effect that used to be obtained with a smaller dose.  How long should a "Drug Holiday" last? You should stay off of the pain medicine for at least 14 consecutive days. (2 weeks)  Should I stop the medicine "cold [place]"? No. You should always coordinate with your Pain Specialist so that he/she can provide you with the correct medication dose to make the transition as smoothly as possible.  How do I stop the medicine? Slowly. You will be instructed to decrease the daily amount of pills that you take by one (1) pill every seven (7) days. This is called a "slow downward taper" of your dose. For example: if you normally take four (4) pills per day, you will be asked to drop this dose to three (3) pills per day for seven (7) days, then to two (2) pills per day for seven (7) days, then to one (1) per day for seven (7) days, and at the end of those last seven (7) days, this is when the "Drug Holiday" would start.   Will I have withdrawals? By doing a "slow downward taper" like this one, it is unlikely that you will experience any significant withdrawal symptoms. Typically, what triggers withdrawals is the sudden stop of a high dose opioid therapy. Withdrawals can usually be avoided by slowly decreasing the dose over a prolonged period of time. If you do not follow these instructions and decide to stop your medication abruptly, withdrawals may be possible.  What are withdrawals? Withdrawals: refers to the wide range of symptoms that occur after stopping or dramatically reducing opiate drugs after heavy and prolonged use. Withdrawal symptoms do not occur to patients that use low dose opioids, or those who take the medication sporadically. Contrary to benzodiazepine (example: Valium,  Xanax, etc.) or alcohol withdrawals ("Delirium Tremens"), opioid withdrawals are not lethal. Withdrawals are the physical manifestation of the body getting rid of the excess receptors.  Expected Symptoms Early symptoms of withdrawal may include: Agitation Anxiety Muscle aches Increased tearing Insomnia Runny nose Sweating Yawning  Late symptoms of withdrawal may include: Abdominal cramping Diarrhea Dilated pupils Goose bumps Nausea Vomiting  Will I experience withdrawals? Due to the slow nature of the taper, it is very unlikely that you will experience any.  What is a slow taper? Taper: refers to the gradual decrease in dose.  (Last update: 06/23/2020) ____________________________________________________________________________________________

## 2021-05-28 ENCOUNTER — Other Ambulatory Visit: Payer: Self-pay | Admitting: Family Medicine

## 2021-05-28 DIAGNOSIS — E7800 Pure hypercholesterolemia, unspecified: Secondary | ICD-10-CM

## 2021-05-28 NOTE — Telephone Encounter (Signed)
Requested Prescriptions  Pending Prescriptions Disp Refills  . rosuvastatin (CRESTOR) 20 MG tablet [Pharmacy Med Name: ROSUVASTATIN CALCIUM 20 MG TAB] 90 tablet 0    Sig: Take 1 tablet (20 mg total) by mouth daily.     Cardiovascular:  Antilipid - Statins Failed - 05/28/2021  9:03 AM      Failed - HDL in normal range and within 360 days    HDL  Date Value Ref Range Status  01/25/2021 33 (L) >39 mg/dL Final         Passed - Total Cholesterol in normal range and within 360 days    Cholesterol, Total  Date Value Ref Range Status  01/25/2021 136 100 - 199 mg/dL Final         Passed - LDL in normal range and within 360 days    LDL Chol Calc (NIH)  Date Value Ref Range Status  01/25/2021 77 0 - 99 mg/dL Final         Passed - Triglycerides in normal range and within 360 days    Triglycerides  Date Value Ref Range Status  01/25/2021 146 0 - 149 mg/dL Final         Passed - Patient is not pregnant      Passed - Valid encounter within last 12 months    Recent Outpatient Visits          4 months ago Type 2 diabetes mellitus without complication, without long-term current use of insulin ([place])   [facility] Birdie Sons, MD   8 months ago Type 2 diabetes mellitus without complication, without long-term current use of insulin ([place])   [facility] Birdie Sons, MD   10 months ago CAFL (chronic airflow limitation) [facility])   [facility] [place], [place], PA-C   1 year ago Type 2 diabetes mellitus without complication, without long-term current use of insulin [facility])   [facility] Birdie Sons, MD   1 year ago Acute frontal sinusitis, recurrence not specified   [facility] Birdie Sons, MD      Future Appointments            In 2 weeks Fisher, Kirstie Peri, MD [facility], [facility]           . lisinopril-hydrochlorothiazide (ZESTORETIC) 20-12.5 MG tablet [Pharmacy Med Name:  LISINOPRIL-HCTZ 20-12.5 MG TAB] 90 tablet 0    Sig: Take 1 tablet by mouth daily.     Cardiovascular:  ACEI + Diuretic Combos Failed - 05/28/2021  9:03 AM      Failed - Last BP in normal range    BP Readings from Last 1 Encounters:  05/25/21 (!) 131/97         Passed - Na in normal range and within 180 days    Sodium  Date Value Ref Range Status  04/11/2021 139 135 - 145 mmol/L Final  01/25/2021 141 134 - 144 mmol/L Final  03/28/2013 139 136 - 145 mmol/L Final         Passed - K in normal range and within 180 days    Potassium  Date Value Ref Range Status  04/11/2021 3.8 3.5 - 5.1 mmol/L Final  03/28/2013 3.7 3.5 - 5.1 mmol/L Final         Passed - Cr in normal range and within 180 days    Creat  Date Value Ref Range Status  08/30/2017 0.60 0.50 - 0.99 mg/dL Final  Comment:    For patients >86 years of age, the reference limit for Creatinine is approximately 13% higher for people identified as African-American. .    Creatinine, Ser  Date Value Ref Range Status  04/11/2021 0.79 0.44 - 1.00 mg/dL Final   Creatinine, POC  Date Value Ref Range Status  12/12/2017 n/a mg/dL Final         Passed - Ca in normal range and within 180 days    Calcium  Date Value Ref Range Status  04/11/2021 9.3 8.9 - 10.3 mg/dL Final   Calcium, Total  Date Value Ref Range Status  03/28/2013 8.2 (L) 8.5 - 10.1 mg/dL Final   Calcium, Ion  Date Value Ref Range Status  04/06/2021 1.25 1.15 - 1.40 mmol/L Final         Passed - Patient is not pregnant      Passed - Valid encounter within last 6 months    Recent Outpatient Visits          4 months ago Type 2 diabetes mellitus without complication, without long-term current use of insulin ([place])   [facility] Birdie Sons, MD   8 months ago Type 2 diabetes mellitus without complication, without long-term current use of insulin ([place])   [facility] Birdie Sons, MD   10 months ago CAFL (chronic  airflow limitation) [facility])   [facility] [place], [place], PA-C   1 year ago Type 2 diabetes mellitus without complication, without long-term current use of insulin [facility])   [facility] Birdie Sons, MD   1 year ago Acute frontal sinusitis, recurrence not specified   [facility] Birdie Sons, MD      Future Appointments            In 2 weeks Fisher, Kirstie Peri, MD [facility], [place]

## 2021-05-30 ENCOUNTER — Other Ambulatory Visit: Payer: Self-pay | Admitting: Obstetrics & Gynecology

## 2021-05-30 ENCOUNTER — Other Ambulatory Visit: Payer: Self-pay | Admitting: Family Medicine

## 2021-05-30 MED ORDER — ALLOPURINOL 100 MG PO TABS: 100.0000 mg | ORAL_TABLET | Freq: Every day | ORAL | 4 refills | Status: DC

## 2021-05-30 NOTE — Telephone Encounter (Signed)
[place] faxed refill request for the following medications:  allopurinol (ZYLOPRIM) 100 MG tablet  Last Rx: 03/02/21 Qty: 90 Refills: 0 LOV: 01/25/21 NOV: 06/15/21 Please advise. Thanks TNP

## 2021-06-03 LAB — TOXASSURE SELECT 13 (MW), URINE

## 2021-06-06 DIAGNOSIS — Z20822 Contact with and (suspected) exposure to covid-19: Secondary | ICD-10-CM | POA: Diagnosis not present

## 2021-06-07 ENCOUNTER — Other Ambulatory Visit: Payer: Self-pay | Admitting: Obstetrics & Gynecology

## 2021-06-07 DIAGNOSIS — H903 Sensorineural hearing loss, bilateral: Secondary | ICD-10-CM | POA: Diagnosis not present

## 2021-06-07 DIAGNOSIS — H8191 Unspecified disorder of vestibular function, right ear: Secondary | ICD-10-CM | POA: Diagnosis not present

## 2021-06-07 DIAGNOSIS — R42 Dizziness and giddiness: Secondary | ICD-10-CM | POA: Diagnosis not present

## 2021-06-15 ENCOUNTER — Ambulatory Visit: Payer: Self-pay | Admitting: Family Medicine

## 2021-06-20 ENCOUNTER — Encounter: Payer: Self-pay | Admitting: Obstetrics & Gynecology

## 2021-06-20 ENCOUNTER — Ambulatory Visit (INDEPENDENT_AMBULATORY_CARE_PROVIDER_SITE_OTHER): Payer: Medicare Other | Admitting: Obstetrics & Gynecology

## 2021-06-20 ENCOUNTER — Other Ambulatory Visit: Payer: Self-pay

## 2021-06-20 DIAGNOSIS — N83201 Unspecified ovarian cyst, right side: Secondary | ICD-10-CM | POA: Diagnosis not present

## 2021-06-20 NOTE — Progress Notes (Signed)
Virtual Visit via Telephone Note  I connected with Laura Nielsen on 06/20/21 at  2:10 PM EDT by telephone and verified that I am speaking with the correct person using two identifiers.  Location: Patient: Home Provider: Office   I discussed the limitations, risks, security and privacy concerns of performing an evaluation and management service by telephone and the availability of in person appointments. I also discussed with the patient that there may be a patient responsible charge related to this service. The patient expressed understanding and agreed to proceed.   Observations/Objective: No exam today, due to telephone eVisit due to [facility] virus restriction on elective visits and procedures.  Prior visits reviewed along with ultrasounds/labs as indicated.   HPI: Pt denies pain or other sx's related to known right ovarian cyst.  Prior CA125 normal.  [place] monitoring of size.  She had dx of cyst by PET and CT related to other concerns.  Ultrasound demonstrates right sided 5 cm cyst (down from 6 cm last year).  No other abnormalities.  PMHx: She  has a past medical history of AAA (abdominal aortic aneurysm) ([place]), Allergy, Anxiety, Arthritis, Asthma, Complication of anesthesia, Coronary artery disease, Depression, Diabetes mellitus without complication ([place]), Dyspnea, Emphysema of lung ([place]), GERD (gastroesophageal reflux disease), History of kidney stones, Hyperlipidemia, Hypertension, Osteoporosis, Sleep apnea, and Tobacco abuse counseling. Also,  has a past surgical history that includes Cardiac catheterization (02/2009); Kidney stone surgery (1999); Vaginal hysterectomy; Tubal ligation; Cardiac catheterization; Coronary angioplasty; Colonoscopy with propofol (N/A, 01/05/2021); Esophagogastroduodenoscopy (egd) with propofol (N/A, 01/05/2021); Abdominal aortic endovascular stent graft (03/27/2013); Intercostal nerve block (Left, 04/06/2021); and Node dissection (Left, 04/06/2021)., family history includes  Alcohol abuse in her father and sister; Breast cancer (age of onset: 17) in her sister; Cancer in her mother; Cancer (age of onset: 60) in her sister; Coronary artery disease in her mother; Coronary artery disease (age of onset: 62) in her sister; Depression in her father; Heart attack in her mother and sister; Heart attack (age of onset: 66) in her father; Hyperlipidemia in her sister; Hypertension in her father, mother, and sister.,  reports that she has been smoking cigarettes. She has a 11.13 pack-year smoking history. She has never used smokeless tobacco. She reports current alcohol use. She reports that she does not use drugs.  She has a current medication list which includes the following prescription(s): acetaminophen, acetaminophen-codeine, albuterol, allopurinol, aspirin, bisoprolol, cholecalciferol, clopidogrel, cyanocobalamin, gabapentin, ibuprofen, lisinopril-hydrochlorothiazide, oxycodone, oxycodone, ozempic (1 mg/dose), pantoprazole, lubricating eye drops, and rosuvastatin. Also, is allergic to atorvastatin, omeprazole, and bupropion.  Review of Systems  All other systems reviewed and are negative.  Assessment:  Ovarian cyst, right Reassuring results, and no sx's, so cont to monitor Options for surgery and removal right ovary discussed Plan [place] and CA125 in 6 mos  I discussed the assessment and treatment plan with the patient. The patient was provided an opportunity to ask questions and all were answered. The patient agreed with the plan and demonstrated an understanding of the instructions.   The patient was advised to call back or seek an in-person evaluation if the symptoms worsen or if the condition fails to improve as anticipated.  A total of 15 minutes were spent face-to-face with the patient as well as preparation, review, communication, and documentation during this encounter.   Barnett Applebaum, MD, [facility], [facility] 06/20/2021  2:50 PM

## 2021-06-21 ENCOUNTER — Encounter: Payer: Medicare Other | Admitting: Thoracic Surgery (Cardiothoracic Vascular Surgery)

## 2021-06-29 ENCOUNTER — Other Ambulatory Visit (INDEPENDENT_AMBULATORY_CARE_PROVIDER_SITE_OTHER): Payer: Self-pay | Admitting: Vascular Surgery

## 2021-06-29 DIAGNOSIS — Q251 Coarctation of aorta: Secondary | ICD-10-CM

## 2021-06-29 DIAGNOSIS — I6523 Occlusion and stenosis of bilateral carotid arteries: Secondary | ICD-10-CM

## 2021-06-30 ENCOUNTER — Ambulatory Visit (INDEPENDENT_AMBULATORY_CARE_PROVIDER_SITE_OTHER): Payer: Medicare Other

## 2021-06-30 ENCOUNTER — Encounter (INDEPENDENT_AMBULATORY_CARE_PROVIDER_SITE_OTHER): Payer: Self-pay | Admitting: Vascular Surgery

## 2021-06-30 ENCOUNTER — Ambulatory Visit: Payer: Federal, State, Local not specified - PPO

## 2021-06-30 ENCOUNTER — Ambulatory Visit (INDEPENDENT_AMBULATORY_CARE_PROVIDER_SITE_OTHER): Payer: Medicare Other | Admitting: Vascular Surgery

## 2021-06-30 ENCOUNTER — Other Ambulatory Visit: Payer: Self-pay

## 2021-06-30 VITALS — BP 127/80 | HR 76 | Resp 16 | Wt 251.0 lb

## 2021-06-30 DIAGNOSIS — I714 Abdominal aortic aneurysm, without rupture, unspecified: Secondary | ICD-10-CM

## 2021-06-30 DIAGNOSIS — I70213 Atherosclerosis of native arteries of extremities with intermittent claudication, bilateral legs: Secondary | ICD-10-CM | POA: Diagnosis not present

## 2021-06-30 DIAGNOSIS — Q251 Coarctation of aorta: Secondary | ICD-10-CM

## 2021-06-30 DIAGNOSIS — I6523 Occlusion and stenosis of bilateral carotid arteries: Secondary | ICD-10-CM | POA: Diagnosis not present

## 2021-06-30 DIAGNOSIS — E119 Type 2 diabetes mellitus without complications: Secondary | ICD-10-CM | POA: Diagnosis not present

## 2021-06-30 DIAGNOSIS — E7800 Pure hypercholesterolemia, unspecified: Secondary | ICD-10-CM

## 2021-06-30 DIAGNOSIS — I10 Essential (primary) hypertension: Secondary | ICD-10-CM | POA: Diagnosis not present

## 2021-06-30 NOTE — Progress Notes (Signed)
MRN : [id number]  Laura Nielsen is a 65 y.o. (07/19/1956) female who presents with chief complaint of No chief complaint on file. Marland Kitchen  History of Present Illness:   The patient returns to the office for surveillance of an abdominal aortic aneurysm status post stent graft placement on 03/27/2013.   Patient denies abdominal pain or back pain, no other abdominal complaints. No groin related complaints. No symptoms consistent with distal embolization No changes in claudication distance.   In May she had a lobectomy for lung CA   Patient denies amaurosis fugax or TIA symptoms. There is no history of claudication or rest pain symptoms of the lower extremities. The patient denies angina or shortness of breath.    Duplex ultrasound shows a patent stent graft no endoleak and the sac is 2.81 cm; previous study measured 2.8 cm.   Carotid duplex shows 1-39% ICA stenosis bilaterally no change   ABI Rt=1.11 and Lt=1.12 triphasic signals bilaterally.  (Previous ABI Rt=1.22 and Lt=1.19 triphasic signals bilaterally)  No outpatient medications have been marked as taking for the 06/30/21 encounter (Appointment) with Delana Meyer, Dolores Lory, MD.    Past Medical History:  Diagnosis Date   AAA (abdominal aortic aneurysm) ([place])    s/p EVAR AAA 03/27/13   Allergy    Anxiety    Arthritis    Asthma    Complication of anesthesia    BP drops after   Coronary artery disease    Depression    Diabetes mellitus without complication ([place])    Dyspnea    Emphysema of lung (HCC)    GERD (gastroesophageal reflux disease)    History of kidney stones    Hyperlipidemia    Hypertension    Osteoporosis    Sleep apnea    Tobacco abuse counseling     Past Surgical History:  Procedure Laterality Date   ABDOMINAL AORTIC ENDOVASCULAR STENT GRAFT  03/27/2013   Dr. Hortencia Pilar   Cardiac catheterization  02/2009   70-80% stenosis RCA stent placed. started on Plavix   CARDIAC CATHETERIZATION     COLONOSCOPY WITH  PROPOFOL N/A 01/05/2021   Procedure: COLONOSCOPY WITH PROPOFOL;  Surgeon: Virgel Manifold, MD;  Location: [facility] END[facility];  Service: Endoscopy;  Laterality: N/A;   CORONARY ANGIOPLASTY     stent placed   ESOPHAGOGASTRODUODENOSCOPY (EGD) WITH PROPOFOL N/A 01/05/2021   Procedure: ESOPHAGOGASTRODUODENOSCOPY (EGD) WITH PROPOFOL;  Surgeon: Virgel Manifold, MD;  Location: [facility];  Service: Endoscopy;  Laterality: N/A;   INTERCOSTAL NERVE BLOCK Left 04/06/2021   Procedure: INTERCOSTAL NERVE BLOCK;  Surgeon: Melrose Nakayama, MD;  Location: [place];  Service: Thoracic;  Laterality: Left;   [place] Left 04/06/2021   Procedure: NODE DISSECTION;  Surgeon: Melrose Nakayama, MD;  Location: [place];  Service: Thoracic;  Laterality: Left;   TUBAL LIGATION     VAGINAL HYSTERECTOMY     Menometrorrhagia. Excessive bleeding. Unknown if cervix removed.     Social History Social History   Tobacco Use   Smoking status: Every Day    Packs/day: 0.25    Years: 44.50    Pack years: 11.13    Types: Cigarettes   Smokeless tobacco: Never   Tobacco comments:    12/23/19 states she quit for 3 months and then started back. Pt is going to talk with MD about this.  Vaping Use   Vaping Use: Never used  Substance Use Topics   Alcohol use: Yes  Comment: rarely - once a year   Drug use: No    Family History Family History  Problem Relation Age of Onset   Hypertension Mother    Coronary artery disease Mother    Heart attack Mother        acute   Cancer Mother    Alcohol abuse Father    Depression Father    Hypertension Father    Heart attack Father 24       acute   Alcohol abuse Sister    Hyperlipidemia Sister    Hypertension Sister    Cancer Sister 34   Heart attack Sister        x's 2   Coronary artery disease Sister 19       x's 2   Breast cancer Sister 15    Allergies  Allergen Reactions   Atorvastatin Other (See Comments)    Elevated  blood sugar    Omeprazole Nausea And Vomiting   Bupropion Nausea Only     REVIEW OF SYSTEMS (Negative unless checked)  Constitutional: [] Weight loss  [] Fever  [] Chills Cardiac: [] Chest pain   [] Chest pressure   [] Palpitations   [] Shortness of breath when laying flat   [] Shortness of breath with exertion. Vascular:  [] Pain in legs with walking   [] Pain in legs at rest  [] History of DVT   [] Phlebitis   [] Swelling in legs   [] Varicose veins   [] Non-healing ulcers Pulmonary:   [] Uses home oxygen   [] Productive cough   [] Hemoptysis   [] Wheeze  [] COPD   [] Asthma Neurologic:  [] Dizziness   [] Seizures   [] History of stroke   [] History of TIA  [] Aphasia   [] Vissual changes   [] Weakness or numbness in arm   [] Weakness or numbness in leg Musculoskeletal:   [] Joint swelling   [] Joint pain   [] Low back pain Hematologic:  [] Easy bruising  [] Easy bleeding   [] Hypercoagulable state   [] Anemic Gastrointestinal:  [] Diarrhea   [] Vomiting  [] Gastroesophageal reflux/heartburn   [] Difficulty swallowing. Genitourinary:  [] Chronic kidney disease   [] Difficult urination  [] Frequent urination   [] Blood in urine Skin:  [] Rashes   [] Ulcers  Psychological:  [] History of anxiety   []  History of major depression.  Physical Examination  There were no vitals filed for this visit. There is no height or weight on file to calculate BMI. Gen: WD/WN, NAD Head: [place]/AT, No temporalis wasting.  Ear/Nose/Throat: Hearing grossly intact, nares w/o erythema or drainage Eyes: PER, EOMI, sclera nonicteric.  Neck: Supple, no large masses.   Pulmonary:  Good air movement, no audible wheezing bilaterally, no use of accessory muscles.  Cardiac: RRR, no JVD Vascular:   No carotid bruits Vessel Right Left  Radial Palpable Palpable  Carotid Palpable Palpable  PT Palpable Palpable  DP Palpable Palpable  Gastrointestinal: Non-distended. No guarding/no peritoneal signs.  Musculoskeletal: M/S 5/5 throughout.  No deformity or atrophy.   Neurologic: CN 2-12 intact. Symmetrical.  Speech is fluent. Motor exam as listed above. Psychiatric: Judgment intact, Mood & affect appropriate for pt's clinical situation. Dermatologic: No rashes or ulcers noted.  No changes consistent with cellulitis. Lymph : No lichenification or skin changes of chronic lymphedema.  CBC Lab Results  Component Value Date   WBC 8.7 04/11/2021   HGB 13.9 04/11/2021   HCT 42.2 04/11/2021   MCV 85.8 04/11/2021   PLT 205 04/11/2021    BMET    Component Value Date/Time   NA 139 04/11/2021 0022   NA 141 01/25/2021  1048   NA 139 03/28/2013 0354   K 3.8 04/11/2021 0022   K 3.7 03/28/2013 1811   CL 104 04/11/2021 0022   CL 107 03/28/2013 0354   CO2 26 04/11/2021 0022   CO2 27 03/28/2013 0354   GLUCOSE 101 (H) 04/11/2021 0022   GLUCOSE 112 (H) 03/28/2013 0354   BUN 17 04/11/2021 0022   BUN 16 01/25/2021 1048   BUN 8 03/28/2013 0354   CREATININE 0.79 04/11/2021 0022   CREATININE 0.60 08/30/2017 0920   CALCIUM 9.3 04/11/2021 0022   CALCIUM 8.2 (L) 03/28/2013 0354   GFRNONAA >60 04/11/2021 0022   GFRNONAA 99 08/30/2017 0920   GFRAA 93 01/25/2021 1048   GFRAA 115 08/30/2017 0920   CrCl cannot be calculated (Patient's most recent lab result is older than the maximum 21 days allowed.).  COAG Lab Results  Component Value Date   INR 1.0 04/04/2021   INR 0.9 04/26/2017   INR 1.1 03/28/2013    Radiology No results found.    Assessment/Plan 1. AAA (abdominal aortic aneurysm) without rupture (HCC) Recommend: Patient is status post successful endovascular repair of the AAA.   No further intervention is required at this time.   No endoleak is detected and the aneurysm sac is stable.   The patient will continue antiplatelet therapy as prescribed as well as aggressive management of hyperlipidemia. Exercise is again strongly encouraged.   However, endografts require continued surveillance with ultrasound or CT scan. This is mandatory to  detect any changes that allow repressurization of the aneurysm sac.  The patient is informed that this would be asymptomatic.   The patient is reminded that lifelong routine surveillance is a necessity with an endograft. Patient will continue to follow-up at 6 month intervals with ultrasound of the aorta. - VAS US AORTA/IVC/ILIACS; Future  2. Atherosclerosis of native artery of both lower extremities with intermittent claudication (HCC) Recommend:   The patient has evidence of atherosclerosis of the lower extremities with claudication.  The patient does not voice lifestyle limiting changes at this point in time.   Noninvasive studies do not suggest clinically significant change.   No invasive studies, angiography or surgery at this time The patient should continue walking and begin a more formal exercise program. The patient should continue antiplatelet therapy and aggressive treatment of the lipid abnormalities   No changes in the patient's medications at this time   The patient should continue wearing graduated compression socks 10-15 mmHg strength to control the mild edema. - VAS [place] ABI WITH/WO TBI; Future  3. Bilateral carotid artery stenosis Recommend:   Given the patient's asymptomatic subcritical stenosis no further invasive testing or surgery at this time.   Previous duplex ultrasound shows <40% stenosis bilaterally.   Continue antiplatelet therapy as prescribed Continue management of CAD, HTN and Hyperlipidemia Healthy heart diet,  encouraged exercise at least 4 times per week Follow up in 12 months with duplex ultrasound and physical exam - VAS US CAROTID; Future  4. Benign essential HTN Continue antihypertensive medications as already ordered, these medications have been reviewed and there are no changes at this time.   5. Type 2 diabetes mellitus without complication, without long-term current use of insulin (HCC) Continue hypoglycemic medications as already ordered,  these medications have been reviewed and there are no changes at this time.  Hgb A1C to be monitored as already arranged by primary service   6. Hypercholesteremia Continue statin as ordered and reviewed, no changes at this time  Hortencia Pilar, MD  06/30/2021 7:58 AM

## 2021-07-11 ENCOUNTER — Telehealth: Payer: Self-pay | Admitting: Family Medicine

## 2021-07-11 NOTE — Telephone Encounter (Signed)
[facility] faxed refill request for the following medications:   bisoprolol (ZEBETA) 10 MG tablet   Please advise.

## 2021-07-12 ENCOUNTER — Other Ambulatory Visit: Payer: Self-pay

## 2021-07-12 ENCOUNTER — Ambulatory Visit (INDEPENDENT_AMBULATORY_CARE_PROVIDER_SITE_OTHER): Payer: Medicare Other | Admitting: Family Medicine

## 2021-07-12 ENCOUNTER — Other Ambulatory Visit: Payer: Self-pay | Admitting: Family Medicine

## 2021-07-12 ENCOUNTER — Encounter: Payer: Self-pay | Admitting: Family Medicine

## 2021-07-12 VITALS — BP 133/74 | HR 71 | Temp 97.9°F | Resp 18 | Wt 255.0 lb

## 2021-07-12 DIAGNOSIS — E559 Vitamin D deficiency, unspecified: Secondary | ICD-10-CM | POA: Diagnosis not present

## 2021-07-12 DIAGNOSIS — M7918 Myalgia, other site: Secondary | ICD-10-CM

## 2021-07-12 DIAGNOSIS — T148XXA Other injury of unspecified body region, initial encounter: Secondary | ICD-10-CM

## 2021-07-12 DIAGNOSIS — I10 Essential (primary) hypertension: Secondary | ICD-10-CM | POA: Diagnosis not present

## 2021-07-12 DIAGNOSIS — I700 Atherosclerosis of aorta: Secondary | ICD-10-CM

## 2021-07-12 DIAGNOSIS — E119 Type 2 diabetes mellitus without complications: Secondary | ICD-10-CM

## 2021-07-12 DIAGNOSIS — I6523 Occlusion and stenosis of bilateral carotid arteries: Secondary | ICD-10-CM

## 2021-07-12 MED ORDER — GABAPENTIN 300 MG PO CAPS: ORAL_CAPSULE | ORAL | Status: DC

## 2021-07-12 NOTE — Progress Notes (Signed)
Established patient visit   Patient: Laura Nielsen   DOB: 05-Oct-1956   65 y.o. Female  MRN: [id number] Visit Date: 07/12/2021  Today's healthcare provider: Lelon Huh, MD   Chief Complaint  Patient presents with   Diabetes   Vitamin D Deficiency   Subjective    HPI  Diabetes Mellitus Type II, Follow-up  Lab Results  Component Value Date   HGBA1C 5.9 (H) 04/04/2021   HGBA1C 6.2 (H) 01/25/2021   HGBA1C 6.0 (A) 09/03/2020   Wt Readings from Last 3 Encounters:  07/12/21 255 lb (115.7 kg)  06/30/21 251 lb (113.9 kg)  05/25/21 253 lb (114.8 kg)   Last seen for diabetes 5 months ago.  Management since then includes continue same medication. She reports good compliance with treatment. She is not having side effects.  Symptoms: No fatigue No foot ulcerations  No appetite changes No nausea  Yes paresthesia of the feet  Yes polydipsia  No polyuria No visual disturbances   No vomiting     Home blood sugar records:  blood sugars are not checked  Episodes of hypoglycemia? No    Current insulin regiment: none Most Recent Eye Exam: not UTD Current exercise: none Current diet habits: on average, 1 meals per day  Pertinent Labs: Lab Results  Component Value Date   CHOL 136 01/25/2021   HDL 33 (L) 01/25/2021   LDLCALC 77 01/25/2021   TRIG 146 01/25/2021   CHOLHDL 4.1 01/25/2021   Lab Results  Component Value Date   NA 139 04/11/2021   K 3.8 04/11/2021   CREATININE 0.79 04/11/2021   GFRNONAA >60 04/11/2021   GFRAA 93 01/25/2021   GLUCOSE 101 (H) 04/11/2021     ---------------------------------------------------------------------------------------------------   Follow up for Vitamin D Deficiency:  The patient was last seen for this 5 months ago. Changes made at last visit include advising patient to double the dose of vitamin D. Patient currently takes 1,000 units of Vitamin D daily.   Lab Results  Component Value Date   VD25OH 28.6 (L) 01/25/2021     She reports good compliance with treatment. She feels that condition is Unchanged. She is not having side effects.   -----------------------------------------------------------------------------------------       Medications: Outpatient Medications Prior to Visit  Medication Sig   acetaminophen (TYLENOL) 500 MG tablet Take 2 tablets (1,000 mg total) by mouth every 6 (six) hours.   acetaminophen-codeine (TYLENOL #3) 300-30 MG tablet Take 1 tablet by mouth every 8 (eight) hours as needed for moderate pain.   albuterol (PROVENTIL HFA) 108 (90 Base) MCG/ACT inhaler Inhale 2 puffs into the lungs every 6 (six) hours as needed for wheezing or shortness of breath.   allopurinol (ZYLOPRIM) 100 MG tablet Take 1 tablet (100 mg total) by mouth daily.   aspirin 81 MG tablet Take 81 mg by mouth daily.    bisoprolol (ZEBETA) 10 MG tablet Take 1 tablet (10 mg total) by mouth daily.   Cholecalciferol 25 MCG (1000 UT) capsule Take 1,000 Units by mouth daily.   clopidogrel (PLAVIX) 75 MG tablet Take 1 tablet (75 mg total) by mouth daily.   cyanocobalamin 1000 MCG tablet Take 1,000 mcg by mouth daily.   gabapentin (NEURONTIN) 300 MG capsule Take 300-600 mg by mouth See admin instructions. 300 mg three times daily, will take an additional dose at bedtime if needed for pain   ibuprofen (ADVIL) 200 MG tablet Take 400 mg by mouth 2 (two) times daily as needed (  headaches).   lisinopril-hydrochlorothiazide (ZESTORETIC) 20-12.5 MG tablet Take 1 tablet by mouth daily.   OZEMPIC, 1 MG/DOSE, 4 MG/3ML SOPN Inject 1 mg into the skin once a week.   pantoprazole (PROTONIX) 20 MG tablet Take 1 tablet (20 mg total) by mouth daily.   Polyethyl Glycol-Propyl Glycol (LUBRICATING EYE DROPS) 0.4-0.3 % SOLN Place 1 drop into both eyes daily as needed (dry eyes).   rosuvastatin (CRESTOR) 20 MG tablet Take 1 tablet (20 mg total) by mouth daily.   No facility-administered medications prior to visit.    Review of Systems   Constitutional:  Negative for appetite change, chills, fatigue and fever.  Respiratory:  Negative for chest tightness and shortness of breath.   Cardiovascular:  Negative for chest pain and palpitations.  Gastrointestinal:  Negative for abdominal pain, nausea and vomiting.  Neurological:  Negative for dizziness and weakness.       Tingling in legs  Hematological:  Bruises/bleeds easily.      Objective    BP 133/74 (BP Location: Right Arm, Patient Position: Sitting, Cuff Size: Large)   Pulse 71   Temp 97.9 F (36.6 C) (Temporal)   Resp 18   Wt 255 lb (115.7 kg)   BMI 38.77 kg/m     Physical Exam  General appearance: Obese female, cooperative and in no acute distress Head: Normocephalic, without obvious abnormality, atraumatic Respiratory: Respirations even and unlabored, normal respiratory rate Extremities: All extremities are intact.  Skin: Skin color, texture, turgor normal. No rashes seen. Several minor bruises noted Ues and Les bilaterally.  Psych: Appropriate mood and affect. Neurologic: Mental status: Alert, oriented to person, place, and time, thought content appropriate.    Assessment & Plan     1. Benign essential HTN Well controlled.  Continue current medications.    2. Aortic atherosclerosis ([place]) She is tolerating rosuvastatin well with no adverse effects. .   3. Type 2 diabetes mellitus without complication, without long-term current use of insulin (HCC)  - Hemoglobin A1c  4. Bruising Likely secondary to DUAP therapy.  - CBC - PT and PTT  5. Vitamin D insufficiency Doing well with OTC vitamin D supplements.  - VITAMIN D 25 Hydroxy (Vit-D Deficiency, Fractures)  6. Musculoskeletal pain Followed by Dr. Wynona Canes and recently recommended taking additional 300mg  gabapentin at bedtime as needed.         The entirety of the information documented in the History of Present Illness, Review of Systems and Physical Exam were personally obtained by me.  Portions of this information were initially documented by the CMA and reviewed by me for thoroughness and accuracy.     Lelon Huh, MD  [facility] [phone] (phone) [phone] (fax)  [place]

## 2021-07-13 LAB — CBC
Hematocrit: 44.4 % (ref 34.0–46.6)
Hemoglobin: 14.4 g/dL (ref 11.1–15.9)
MCH: 27.9 pg (ref 26.6–33.0)
MCHC: 32.4 g/dL (ref 31.5–35.7)
MCV: 86 fL (ref 79–97)
Platelets: 211 10*3/uL (ref 150–450)
RBC: 5.17 x10E6/uL (ref 3.77–5.28)
RDW: 13.8 % (ref 11.7–15.4)
WBC: 8 10*3/uL (ref 3.4–10.8)

## 2021-07-13 LAB — PT AND PTT
INR: 1 (ref 0.9–1.2)
Prothrombin Time: 10.2 s (ref 9.1–12.0)
aPTT: 25 s (ref 24–33)

## 2021-07-13 LAB — VITAMIN D 25 HYDROXY (VIT D DEFICIENCY, FRACTURES): Vit D, 25-Hydroxy: 37.9 ng/mL (ref 30.0–100.0)

## 2021-07-13 LAB — HEMOGLOBIN A1C
Est. average glucose Bld gHb Est-mCnc: 128 mg/dL
Hgb A1c MFr Bld: 6.1 % — ABNORMAL HIGH (ref 4.8–5.6)

## 2021-07-20 ENCOUNTER — Encounter: Payer: Federal, State, Local not specified - PPO | Admitting: Pain Medicine

## 2021-07-21 ENCOUNTER — Other Ambulatory Visit: Payer: Self-pay | Admitting: Family Medicine

## 2021-07-21 NOTE — Telephone Encounter (Signed)
Requested medication (s) are due for refill today: yes   Requested medication (s) are on the active medication list:  yes  Last refill:  04/25/2021  Future visit scheduled: no  Notes to clinic:  Failed protocol: Evaluate AST, ALT within 2 months of therapy initiation   Requested Prescriptions  Pending Prescriptions Disp Refills   clopidogrel (PLAVIX) 75 MG tablet [Pharmacy Med Name: CLOPIDOGREL 75 MG TABLET] 90 tablet 0    Sig: Take 1 tablet (75 mg total) by mouth daily.     Hematology: Antiplatelets - clopidogrel Failed - 07/21/2021 12:57 PM      Failed - Evaluate AST, ALT within 2 months of therapy initiation.      Passed - ALT in normal range and within 360 days    ALT  Date Value Ref Range Status  04/11/2021 22 0 - 44 U/L Final   SGPT (ALT)  Date Value Ref Range Status  03/28/2013 19 12 - 78 U/L Final          Passed - AST in normal range and within 360 days    AST  Date Value Ref Range Status  04/11/2021 25 15 - 41 U/L Final   SGOT(AST)  Date Value Ref Range Status  03/28/2013 16 15 - 37 Unit/L Final          Passed - HCT in normal range and within 180 days    Hematocrit  Date Value Ref Range Status  07/12/2021 44.4 34.0 - 46.6 % Final          Passed - HGB in normal range and within 180 days    Hemoglobin  Date Value Ref Range Status  07/12/2021 14.4 11.1 - 15.9 g/dL Final          Passed - PLT in normal range and within 180 days    Platelets  Date Value Ref Range Status  07/12/2021 211 150 - 450 x10E3/uL Final          Passed - Valid encounter within last 6 months    Recent Outpatient Visits           1 week ago Benign essential HTN   [facility] Birdie Sons, MD   5 months ago Type 2 diabetes mellitus without complication, without long-term current use of insulin [facility])   [facility] Birdie Sons, MD   10 months ago Type 2 diabetes mellitus without complication, without long-term current use of insulin  [facility])   [facility] Birdie Sons, MD   1 year ago CAFL (chronic airflow limitation) [facility])   [facility] Joseph, [place], PA-C   1 year ago Type 2 diabetes mellitus without complication, without long-term current use of insulin [facility])   [facility] Caryn Section, Kirstie Peri, MD

## 2021-08-01 ENCOUNTER — Other Ambulatory Visit: Payer: Self-pay | Admitting: Thoracic Surgery (Cardiothoracic Vascular Surgery)

## 2021-08-01 DIAGNOSIS — C3432 Malignant neoplasm of lower lobe, left bronchus or lung: Secondary | ICD-10-CM

## 2021-08-01 DIAGNOSIS — C3490 Malignant neoplasm of unspecified part of unspecified bronchus or lung: Secondary | ICD-10-CM

## 2021-08-02 ENCOUNTER — Ambulatory Visit
Admission: RE | Admit: 2021-08-02 | Discharge: 2021-08-02 | Disposition: A | Discharge status: Home / Self Care | Payer: Medicare Other | Source: Ambulatory Visit | Attending: Thoracic Surgery (Cardiothoracic Vascular Surgery) | Admitting: Thoracic Surgery (Cardiothoracic Vascular Surgery)

## 2021-08-02 ENCOUNTER — Ambulatory Visit (INDEPENDENT_AMBULATORY_CARE_PROVIDER_SITE_OTHER): Payer: Medicare Other | Admitting: Thoracic Surgery (Cardiothoracic Vascular Surgery)

## 2021-08-02 ENCOUNTER — Other Ambulatory Visit: Payer: Self-pay

## 2021-08-02 VITALS — BP 122/80 | HR 69 | Resp 20 | Ht 68.0 in | Wt 254.0 lb

## 2021-08-02 DIAGNOSIS — I6523 Occlusion and stenosis of bilateral carotid arteries: Secondary | ICD-10-CM | POA: Diagnosis not present

## 2021-08-02 DIAGNOSIS — C3432 Malignant neoplasm of lower lobe, left bronchus or lung: Secondary | ICD-10-CM

## 2021-08-02 DIAGNOSIS — R918 Other nonspecific abnormal finding of lung field: Secondary | ICD-10-CM | POA: Diagnosis not present

## 2021-08-02 DIAGNOSIS — Z9889 Other specified postprocedural states: Secondary | ICD-10-CM

## 2021-08-02 DIAGNOSIS — C3490 Malignant neoplasm of unspecified part of unspecified bronchus or lung: Secondary | ICD-10-CM

## 2021-08-02 DIAGNOSIS — Z85118 Personal history of other malignant neoplasm of bronchus and lung: Secondary | ICD-10-CM | POA: Diagnosis not present

## 2021-08-02 DIAGNOSIS — I700 Atherosclerosis of aorta: Secondary | ICD-10-CM | POA: Diagnosis not present

## 2021-08-02 NOTE — Progress Notes (Signed)
[address]       [place] [address]             [phone]    HPI: [name] returns for a scheduled follow-up visit  Laura Nielsen is a 65 year old woman with a history of tobacco abuse, lung nodules, obesity, obesity, CAD, hypertension, hyperlipidemia, asthma, reflux, type 2 diabetes, sleep apnea, osteoporosis, depression, and chronic pain.  She had about an 80-pack-year history of smoking.  She did quit in May.  She was first noted to have a cystic lesion in the left lower lobe in August 2021.  PET/CT there was no significant metabolic activity.  However on a follow-up CT the nodule had increased in size.  I did a robotic left lower lobectomy on 04/06/2021.  The nodule turned out to be a stage Ia adenocarcinoma.  I saw her in the office on 04/20/2021.  She was taking about 1 oxycodone a day.  She was still having pain in the left costal margin region.  She is a patient at a pain clinic and receives narcotics from them.  Currently she continues to have pain.  It is better than it was back in May but has not resolved completely.  She has an occasional stabbing pain.  Sometimes does keep her up at night.  She tries not to take anything for it specifically.  Past Medical History:  Diagnosis Date   AAA (abdominal aortic aneurysm) ([place])    s/p EVAR AAA 03/27/13   Allergy    Anxiety    Arthritis    Asthma    Complication of anesthesia    BP drops after   Coronary artery disease    Depression    Diabetes mellitus without complication (HCC)    Dyspnea    Emphysema of lung (HCC)    GERD (gastroesophageal reflux disease)    History of kidney stones    Hyperlipidemia    Hypertension    Osteoporosis    Sleep apnea    Tobacco abuse counseling     Current Outpatient Medications  Medication Sig Dispense Refill   acetaminophen (TYLENOL) 500 MG tablet Take 2 tablets (1,000 mg total) by mouth every 6 (six) hours. 30 tablet 0   acetaminophen-codeine (TYLENOL #3) 300-30  MG tablet Take 1 tablet by mouth every 8 (eight) hours as needed for moderate pain. 30 tablet 0   albuterol (PROVENTIL HFA) 108 (90 Base) MCG/ACT inhaler Inhale 2 puffs into the lungs every 6 (six) hours as needed for wheezing or shortness of breath. 18 g 11   allopurinol (ZYLOPRIM) 100 MG tablet Take 1 tablet (100 mg total) by mouth daily. 90 tablet 4   aspirin 81 MG tablet Take 81 mg by mouth daily.      bisoprolol (ZEBETA) 10 MG tablet Take 1 tablet (10 mg total) by mouth daily. 90 tablet 0   Cholecalciferol 25 MCG (1000 UT) capsule Take 1,000 Units by mouth daily.     clopidogrel (PLAVIX) 75 MG tablet Take 1 tablet (75 mg total) by mouth daily. 90 tablet 1   cyanocobalamin 1000 MCG tablet Take 1,000 mcg by mouth daily.     gabapentin (NEURONTIN) 300 MG capsule Take 1 capsule (300 mg total) by mouth 3 (three) times daily AND 2 capsules (600 mg total) at bedtime.     ibuprofen (ADVIL) 200 MG tablet Take 400 mg by mouth 2 (two) times daily as needed (headaches).     lisinopril-hydrochlorothiazide (ZESTORETIC) 20-12.5  MG tablet Take 1 tablet by mouth daily. 90 tablet 0   OZEMPIC, 1 MG/DOSE, 4 MG/3ML SOPN Inject 1 mg into the skin once a week. 3 mL 0   pantoprazole (PROTONIX) 20 MG tablet Take 1 tablet (20 mg total) by mouth daily. 90 tablet 1   Polyethyl Glycol-Propyl Glycol (LUBRICATING EYE DROPS) 0.4-0.3 % SOLN Place 1 drop into both eyes daily as needed (dry eyes).     rosuvastatin (CRESTOR) 20 MG tablet Take 1 tablet (20 mg total) by mouth daily. 90 tablet 0   No current facility-administered medications for this visit.    Physical Exam BP 122/80   Pulse 69   Resp 20   Ht 5\' 8"  (1.727 m)   Wt 254 lb (115.2 kg)   SpO2 95% Comment: RA  BMI 38.62 kg/m  Obese 65 year old woman in no acute distress Alert and oriented x3 with no focal deficits Lungs diminished at left base but otherwise clear Cardiac regular rate and rhythm Incisions well-healed  Diagnostic Tests: CHEST - 2 VIEW    COMPARISON:  04/20/2021   FINDINGS: Cardiac shadow is within normal limits. Aortic calcifications are again seen. Right lung is mildly hyperinflated but stable. Left lung demonstrates postsurgical change consistent with the given clinical history. No acute bony abnormality is noted.   IMPRESSION: Postsurgical changes on the left.  No acute abnormality noted.     Electronically Signed   By: Inez Catalina M.D.   On: 08/02/2021 11:20 I personally reviewed the chest x-ray images.  There is change associated with left lower lobectomy.  Otherwise unremarkable.  Impression: Laura Nielsen is a 65 year old woman with a history of tobacco abuse, lung nodules, obesity, obesity, CAD, hypertension, hyperlipidemia, asthma, reflux, type 2 diabetes, sleep apnea, osteoporosis, depression, and chronic pain.   Stage Ia adenocarcinoma-status post left lower lobectomy.  Did not require adjuvant therapy.  Did see Dr. Rogue Bussing in [place].  He has scheduled her to come back in 6 months for CT of the chest.  Postoperative intercostal neuralgia-still has pain although it has improved since her last visit.  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  Tobacco abuse-80-pack-year history.  Quit prior to surgery.  Has not smoked although she does still have cravings.  I congratulated her and emphasized the importance of continued abstinence.  Plan: Follow-up as scheduled with Dr. Rogue Bussing Return in 4 months after CT chest done  Melrose Nakayama, MD [facility] [phone]

## 2021-08-29 ENCOUNTER — Other Ambulatory Visit: Payer: Self-pay | Admitting: Family Medicine

## 2021-08-29 DIAGNOSIS — E7800 Pure hypercholesterolemia, unspecified: Secondary | ICD-10-CM

## 2021-09-16 ENCOUNTER — Ambulatory Visit: Payer: Self-pay

## 2021-09-16 DIAGNOSIS — S93601A Unspecified sprain of right foot, initial encounter: Secondary | ICD-10-CM | POA: Diagnosis not present

## 2021-09-16 NOTE — Telephone Encounter (Signed)
Pt. Injured right foot Wednesday. Tripped. Foot has become more painful to walk on. No bruising. Severe pain when trying to walk. No availability in the practice. Pt. Will go to [facility].  Reason for Disposition  [1] SEVERE pain (e.g., excruciating, unable to do any normal activities) AND [2] not improved after 2 hours of pain medicine  Answer Assessment - Initial Assessment Questions 1. ONSET: "When did the pain start?"      Wednesday 2. LOCATION: "Where is the pain located?"      Right foot 3. PAIN: "How bad is the pain?"    (Scale 1-10; or mild, moderate, severe)  - MILD (1-3): doesn't interfere with normal activities.   - MODERATE (4-7): interferes with normal activities (e.g., work or school) or awakens from sleep, limping.   - SEVERE (8-10): excruciating pain, unable to do any normal activities, unable to walk.      When walking - severe 4. WORK OR EXERCISE: "Has there been any recent work or exercise that involved this part of the body?"      No 5. CAUSE: "What do you think is causing the foot pain?"     Tripped Wednesday 6. OTHER SYMPTOMS: "Do you have any other symptoms?" (e.g., leg pain, rash, fever, numbness)     No 7. PREGNANCY: "Is there any chance you are pregnant?" "When was your last menstrual period?"     No  Protocols used: Foot Pain-A-AH

## 2021-09-22 ENCOUNTER — Other Ambulatory Visit: Payer: Self-pay | Admitting: Gastroenterology

## 2021-10-08 ENCOUNTER — Other Ambulatory Visit: Payer: Self-pay | Admitting: Family Medicine

## 2021-10-08 DIAGNOSIS — I10 Essential (primary) hypertension: Secondary | ICD-10-CM

## 2021-10-08 NOTE — Telephone Encounter (Signed)
Requested Prescriptions  Pending Prescriptions Disp Refills  . bisoprolol (ZEBETA) 10 MG tablet [Pharmacy Med Name: BISOPROLOL FUMARATE 10 MG TAB] 90 tablet 0    Sig: Take 1 tablet (10 mg total) by mouth daily.     Cardiovascular:  Beta Blockers Passed - 10/08/2021 10:35 AM      Passed - Last BP in normal range    BP Readings from Last 1 Encounters:  08/02/21 122/80         Passed - Last Heart Rate in normal range    Pulse Readings from Last 1 Encounters:  08/02/21 69         Passed - Valid encounter within last 6 months    Recent Outpatient Visits          2 months ago Benign essential HTN   [facility] Birdie Sons, MD   8 months ago Type 2 diabetes mellitus without complication, without long-term current use of insulin ([place])   [facility] Birdie Sons, MD   1 year ago Type 2 diabetes mellitus without complication, without long-term current use of insulin [facility])   [facility] Birdie Sons, MD   1 year ago CAFL (chronic airflow limitation) [facility])   [facility] [place], [place], PA-C   1 year ago Type 2 diabetes mellitus without complication, without long-term current use of insulin [facility])   [facility] Birdie Sons, MD

## 2021-10-13 DIAGNOSIS — Z20828 Contact with and (suspected) exposure to other viral communicable diseases: Secondary | ICD-10-CM | POA: Diagnosis not present

## 2021-11-07 ENCOUNTER — Ambulatory Visit
Admission: RE | Admit: 2021-11-07 | Discharge: 2021-11-07 | Disposition: A | Discharge status: Home / Self Care | Payer: Medicare Other | Source: Ambulatory Visit | Attending: Internal Medicine | Admitting: Internal Medicine

## 2021-11-07 ENCOUNTER — Other Ambulatory Visit: Payer: Self-pay

## 2021-11-07 DIAGNOSIS — R911 Solitary pulmonary nodule: Secondary | ICD-10-CM | POA: Diagnosis not present

## 2021-11-07 DIAGNOSIS — R918 Other nonspecific abnormal finding of lung field: Secondary | ICD-10-CM | POA: Diagnosis not present

## 2021-11-07 DIAGNOSIS — C3490 Malignant neoplasm of unspecified part of unspecified bronchus or lung: Secondary | ICD-10-CM | POA: Diagnosis not present

## 2021-11-07 DIAGNOSIS — C3432 Malignant neoplasm of lower lobe, left bronchus or lung: Secondary | ICD-10-CM | POA: Diagnosis not present

## 2021-11-07 DIAGNOSIS — J439 Emphysema, unspecified: Secondary | ICD-10-CM | POA: Diagnosis not present

## 2021-11-07 DIAGNOSIS — I700 Atherosclerosis of aorta: Secondary | ICD-10-CM | POA: Diagnosis not present

## 2021-11-09 ENCOUNTER — Inpatient Hospital Stay: Payer: Medicare Other | Attending: Internal Medicine

## 2021-11-09 ENCOUNTER — Inpatient Hospital Stay (HOSPITAL_BASED_OUTPATIENT_CLINIC_OR_DEPARTMENT_OTHER): Payer: Medicare Other | Admitting: Internal Medicine

## 2021-11-09 ENCOUNTER — Other Ambulatory Visit: Payer: Self-pay

## 2021-11-09 ENCOUNTER — Encounter: Payer: Self-pay | Admitting: Internal Medicine

## 2021-11-09 VITALS — BP 133/80 | HR 71 | Temp 97.6°F | Resp 18 | Wt 243.3 lb

## 2021-11-09 DIAGNOSIS — Z79899 Other long term (current) drug therapy: Secondary | ICD-10-CM | POA: Insufficient documentation

## 2021-11-09 DIAGNOSIS — J439 Emphysema, unspecified: Secondary | ICD-10-CM | POA: Diagnosis not present

## 2021-11-09 DIAGNOSIS — F17200 Nicotine dependence, unspecified, uncomplicated: Secondary | ICD-10-CM | POA: Diagnosis not present

## 2021-11-09 DIAGNOSIS — R911 Solitary pulmonary nodule: Secondary | ICD-10-CM | POA: Diagnosis not present

## 2021-11-09 DIAGNOSIS — C3432 Malignant neoplasm of lower lobe, left bronchus or lung: Secondary | ICD-10-CM | POA: Insufficient documentation

## 2021-11-09 DIAGNOSIS — N839 Noninflammatory disorder of ovary, fallopian tube and broad ligament, unspecified: Secondary | ICD-10-CM | POA: Diagnosis not present

## 2021-11-09 LAB — COMPREHENSIVE METABOLIC PANEL
ALT: 21 U/L (ref 0–44)
AST: 27 U/L (ref 15–41)
Albumin: 4 g/dL (ref 3.5–5.0)
Alkaline Phosphatase: 93 U/L (ref 38–126)
Anion gap: 12 (ref 5–15)
BUN: 19 mg/dL (ref 8–23)
CO2: 25 mmol/L (ref 22–32)
Calcium: 9.8 mg/dL (ref 8.9–10.3)
Chloride: 102 mmol/L (ref 98–111)
Creatinine, Ser: 0.63 mg/dL (ref 0.44–1.00)
GFR, Estimated: >60 mL/min (ref >60)
Glucose, Bld: 112 mg/dL — ABNORMAL HIGH (ref 70–99)
Potassium: 3.9 mmol/L (ref 3.5–5.1)
Sodium: 139 mmol/L (ref 135–145)
Total Bilirubin: 0.6 mg/dL (ref 0.3–1.2)
Total Protein: 7.3 g/dL (ref 6.5–8.1)

## 2021-11-09 LAB — CBC WITH DIFFERENTIAL/PLATELET
Abs Immature Granulocytes: 0.01 10*3/uL (ref 0.00–0.07)
Basophils Absolute: 0.1 10*3/uL (ref 0.0–0.1)
Basophils Relative: 1 %
Eosinophils Absolute: 0.2 10*3/uL (ref 0.0–0.5)
Eosinophils Relative: 3 %
HCT: 47 % — ABNORMAL HIGH (ref 36.0–46.0)
Hemoglobin: 15.6 g/dL — ABNORMAL HIGH (ref 12.0–15.0)
Immature Granulocytes: 0 %
Lymphocytes Relative: 42 %
Lymphs Abs: 3.2 10*3/uL (ref 0.7–4.0)
MCH: 28.1 pg (ref 26.0–34.0)
MCHC: 33.2 g/dL (ref 30.0–36.0)
MCV: 84.5 fL (ref 80.0–100.0)
Monocytes Absolute: 0.6 10*3/uL (ref 0.1–1.0)
Monocytes Relative: 8 %
Neutro Abs: 3.5 10*3/uL (ref 1.7–7.7)
Neutrophils Relative %: 46 %
Platelets: 195 10*3/uL (ref 150–400)
RBC: 5.56 MIL/uL — ABNORMAL HIGH (ref 3.87–5.11)
RDW: 13.7 % (ref 11.5–15.5)
WBC: 7.7 10*3/uL (ref 4.0–10.5)
nRBC: 0 % (ref 0.0–0.2)

## 2021-11-09 NOTE — Assessment & Plan Note (Addendum)
#  Left lower lobe-adenocarcinoma stage I. CT scan 11/07/2021-  Status post interval left lower lobectomy-no evidence of any recurrent cancer.  See discussion below regarding right lower lobe lung nodules  # RLL- Mixed solid and ground-glass nodule of the dependent right lower lobe measuring 1.1 x 0.8 cm, central solid component measuring 0.3 cm. This is not significantly changed on examinations dating back to 07/15/2019. Recommend repeat CT in 6 months.   # URI/bronchiolitis from ongoing smoking- recommend claritin +D.  Again recommended quitting smoking/see below  # Post throacotomy pain/tingling and numbness:  Continue gabapentin -300 mg TID; and then at extra at night.    # CAD/ PVD-clinically stable.  # COPD-stable encouraged continue to avoid smoking; again counseled to quit smoking; recommend evaluation with pulmonary.   #Incidental findings on CT Imaging dated: 12/05/ 2022: emphysema; I reviewed/discussed/counseled the patient.   #Right ovarian mass [PET scan 2021] ~5.8 cm; followed by gynecology.  Awaiting repeat ultrasound in June.   # DISPOSITION: # follow up in 6 months- MD; labs- cbc/cmp; CT chest prior-Dr..B  # I reviewed the blood work- with the patient in detail; also reviewed the imaging independently [as summarized above]; and with the patient in detail.

## 2021-11-09 NOTE — Progress Notes (Signed)
Last week patient started having sinus drainage with sore throat.  Sore throat has improved but still has sinus drainage.  Left side ribs are sore to the touch for the last 2-3 days.  Patient has lost 11 lbs since 07/2021 and does report a decrease in appetite

## 2021-11-09 NOTE — Progress Notes (Signed)
[place] NOTE  Patient Care Team: Birdie Sons, MD as PCP - General (Family Medicine) Ubaldo Glassing Javier Docker, MD as Consulting Physician (Cardiology) Schnier, Dolores Lory, MD (Vascular Surgery) Vladimir Crofts, MD as Consulting Physician (Neurology) Milinda Pointer, MD as Referring Physician (Pain Medicine) Pa, [place] (Optometry) Ubaldo Glassing, Javier Docker, MD as Consulting Physician (Cardiology) Virgel Manifold, MD as Consulting Physician (Gastroenterology) Gae Dry, MD as Referring Physician (Obstetrics and Gynecology) Telford Nab, RN as Oncology Nurse Navigator   CHIEF COMPLAINTS/PURPOSE OF CONSULTATION: lung cancer  #  Oncology History Overview Note  # LCASP- LLL nodule 2.3 cm Adenocarcinoma Stage IA  Synchronous Tumors: Not applicable  Total Number of Primary Tumors: 1  Procedure: Wedge resection and lobectomy  Specimen Laterality: Left  Tumor Focality: Unifocal  Tumor Site: Lower lobe of lung  Tumor Size: 2.3 cm  Histologic Type: Acinar adenocarcinoma  Visceral Pleura Invasion: Not identified  Direct Invasion of Adjacent Structures: No adjacent structures present  Lymphovascular Invasion: Not identified  Margins: All margins negative for invasive carcinoma  Treatment Effect: No known presurgical therapy  Regional Lymph Nodes:       Number of Lymph Nodes Involved: 0    Primary cancer of left lower lobe of lung ([place])  05/09/2021 Initial Diagnosis   Primary cancer of left lower lobe of lung ([facility])      HISTORY OF PRESENTING ILLNESS: Alone.  Ambulating independently.  Laura Nielsen 65 y.o.  female history of smoking-active; is here today with results of her surveillance CT scan.  Complains of stuffiness of the nose/sinuses.  Patient continues to have mild shortness of breath on exertion.  Denies any headaches.  Denies any nausea vomiting.  Patient does complain of left lower lobe chest wall pain.  Review of Systems   Constitutional:  Negative for chills, diaphoresis, fever, malaise/fatigue and weight loss.  HENT:  Negative for nosebleeds and sore throat.   Eyes:  Negative for double vision.  Respiratory:  Negative for cough, hemoptysis, sputum production, shortness of breath and wheezing.   Cardiovascular:  Positive for chest pain. Negative for palpitations, orthopnea and leg swelling.  Gastrointestinal:  Negative for abdominal pain, blood in stool, constipation, diarrhea, heartburn, melena, nausea and vomiting.  Genitourinary:  Negative for dysuria, frequency and urgency.  Musculoskeletal:  Positive for back pain and joint pain.  Skin: Negative.  Negative for itching and rash.  Neurological:  Positive for tingling. Negative for dizziness, focal weakness, weakness and headaches.  Endo/Heme/Allergies:  Does not bruise/bleed easily.  Psychiatric/Behavioral:  Negative for depression. The patient is not nervous/anxious and does not have insomnia.     MEDICAL HISTORY:  Past Medical History:  Diagnosis Date  . AAA (abdominal aortic aneurysm)    s/p EVAR AAA 03/27/13  . Allergy   . Anxiety   . Arthritis   . Asthma   . Complication of anesthesia    BP drops after  . Coronary artery disease   . Depression   . Diabetes mellitus without complication ([place])   . Dyspnea   . Emphysema of lung ([place])   . GERD (gastroesophageal reflux disease)   . History of kidney stones   . Hyperlipidemia   . Hypertension   . Osteoporosis   . Sleep apnea   . Tobacco abuse counseling     SURGICAL HISTORY: Past Surgical History:  Procedure Laterality Date  . ABDOMINAL AORTIC ENDOVASCULAR STENT GRAFT  03/27/2013   Dr. Hortencia Pilar  . Cardiac catheterization  02/2009   70-80% stenosis RCA stent placed. started on Plavix  . CARDIAC CATHETERIZATION    . COLONOSCOPY WITH PROPOFOL N/A 01/05/2021   Procedure: COLONOSCOPY WITH PROPOFOL;  Surgeon: Virgel Manifold, MD;  Location: [facility] END[facility];  Service: Endoscopy;   Laterality: N/A;  . CORONARY ANGIOPLASTY     stent placed  . ESOPHAGOGASTRODUODENOSCOPY (EGD) WITH PROPOFOL N/A 01/05/2021   Procedure: ESOPHAGOGASTRODUODENOSCOPY (EGD) WITH PROPOFOL;  Surgeon: Virgel Manifold, MD;  Location: [facility] END[facility]COPY;  Service: Endoscopy;  Laterality: N/A;  . INTERCOSTAL NERVE BLOCK Left 04/06/2021   Procedure: INTERCOSTAL NERVE BLOCK;  Surgeon: Melrose Nakayama, MD;  Location: [facility];  Service: Thoracic;  Laterality: Left;  . [place]  . NODE DISSECTION Left 04/06/2021   Procedure: NODE DISSECTION;  Surgeon: Melrose Nakayama, MD;  Location: [facility];  Service: Thoracic;  Laterality: Left;  . TUBAL LIGATION    . VAGINAL HYSTERECTOMY     Menometrorrhagia. Excessive bleeding. Unknown if cervix removed.     SOCIAL HISTORY: Social History   Socioeconomic History  . Marital status: Divorced    Spouse name: Not on file  . Number of children: 2  . Years of education: H/S  . Highest education level: High school graduate  Occupational History  . Occupation: Disabled  . Occupation: retired  Tobacco Use  . Smoking status: Former    Packs/day: 0.25    Years: 44.50    Pack years: 11.13    Types: Cigarettes    Quit date: 04/05/2021    Years since quitting: 0.5  . Smokeless tobacco: Never  . Tobacco comments:    12/23/19 states she quit for 3 months and then started back. Pt is going to talk with MD about this.  Vaping Use  . Vaping Use: Never used  Substance and Sexual Activity  . Alcohol use: Yes    Comment: rarely - once a year  . Drug use: No  . Sexual activity: Not on file  Other Topics Concern  . Not on file  Social History Narrative   Hx of smoking; quit prior to lung surgery. Lives in [place]- self. No alcohol. 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 On disability sec to spinal pain.    Social Determinants of Health   Financial Resource Strain: Low Risk   . Difficulty of Paying Living Expenses: Not hard at all  Food  Insecurity: No Food Insecurity  . Worried About Charity fundraiser in the Last Year: Never true  . Ran Out of Food in the Last Year: Never true  Transportation Needs: No Transportation Needs  . Lack of Transportation (Medical): No  . Lack of Transportation (Non-Medical): No  Physical Activity: Inactive  . Days of Exercise per Week: 0 days  . Minutes of Exercise per Session: 0 min  Stress: Stress Concern Present  . Feeling of Stress : To some extent  Social Connections: Socially Isolated  . Frequency of Communication with Friends and Family: More than three times a week  . Frequency of Social Gatherings with Friends and Family: Once a week  . Attends Religious Services: Never  . Active Member of Clubs or Organizations: No  . Attends Archivist Meetings: Never  . Marital Status: Divorced  Human resources officer Violence: Not At Risk  . Fear of Current or Ex-Partner: No  . Emotionally Abused: No  . Physically Abused: No  . Sexually Abused: No    FAMILY HISTORY: Family History  Problem Relation  Age of Onset  . Hypertension Mother   . Coronary artery disease Mother   . Heart attack Mother        acute  . Cancer Mother   . Alcohol abuse Father   . Depression Father   . Hypertension Father   . Heart attack Father 26       acute  . Alcohol abuse Sister   . Hyperlipidemia Sister   . Hypertension Sister   . Cancer Sister 38  . Heart attack Sister        x's 2  . Coronary artery disease Sister 79       x's 2  . Breast cancer Sister 61    ALLERGIES:  is allergic to atorvastatin, omeprazole, and bupropion.  MEDICATIONS:  Current Outpatient Medications  Medication Sig Dispense Refill  . acetaminophen (TYLENOL) 500 MG tablet Take 2 tablets (1,000 mg total) by mouth every 6 (six) hours. 30 tablet 0  . albuterol (PROVENTIL HFA) 108 (90 Base) MCG/ACT inhaler Inhale 2 puffs into the lungs every 6 (six) hours as needed for wheezing or shortness of breath. 18 g 11  .  allopurinol (ZYLOPRIM) 100 MG tablet Take 1 tablet (100 mg total) by mouth daily. 90 tablet 4  . aspirin 81 MG tablet Take 81 mg by mouth daily.     . bisoprolol (ZEBETA) 10 MG tablet Take 1 tablet (10 mg total) by mouth daily. 90 tablet 0  . Cholecalciferol 25 MCG (1000 UT) capsule Take 1,000 Units by mouth daily.    . clopidogrel (PLAVIX) 75 MG tablet Take 1 tablet (75 mg total) by mouth daily. 90 tablet 1  . cyanocobalamin 1000 MCG tablet Take 1,000 mcg by mouth daily.    Marland Kitchen gabapentin (NEURONTIN) 300 MG capsule Take 1 capsule (300 mg total) by mouth 3 (three) times daily AND 2 capsules (600 mg total) at bedtime.    Marland Kitchen ibuprofen (ADVIL) 200 MG tablet Take 400 mg by mouth 2 (two) times daily as needed (headaches).    Marland Kitchen lisinopril-hydrochlorothiazide (ZESTORETIC) 20-12.5 MG tablet Take 1 tablet by mouth daily. 90 tablet 4  . OZEMPIC, 1 MG/DOSE, 4 MG/3ML SOPN Inject 1 mg into the skin once a week. 3 mL 0  . pantoprazole (PROTONIX) 20 MG tablet Take 1 tablet (20 mg total) by mouth daily. 90 tablet 0  . Polyethyl Glycol-Propyl Glycol (LUBRICATING EYE DROPS) 0.4-0.3 % SOLN Place 1 drop into both eyes daily as needed (dry eyes).    . rosuvastatin (CRESTOR) 20 MG tablet Take 1 tablet (20 mg total) by mouth daily. 90 tablet 4  . acetaminophen-codeine (TYLENOL #3) 300-30 MG tablet Take 1 tablet by mouth every 8 (eight) hours as needed for moderate pain. (Patient not taking: Reported on 11/09/2021) 30 tablet 0   No current facility-administered medications for this visit.      Marland Kitchen  PHYSICAL EXAMINATION: ECOG PERFORMANCE STATUS: 1 - Symptomatic but completely ambulatory  Vitals:   11/09/21 1002  BP: 133/80  Pulse: 71  Resp: 18  Temp: 97.6 F (36.4 C)  SpO2: 97%   Filed Weights   11/09/21 1002  Weight: 243 lb 4.8 oz (110.4 kg)    Physical Exam HENT:     Head: Normocephalic and atraumatic.     Mouth/Throat:     Pharynx: No oropharyngeal exudate.  Eyes:     Pupils: Pupils are equal, round,  and reactive to light.  Cardiovascular:     Rate and Rhythm: Normal rate and regular rhythm.  Pulmonary:     Effort: No respiratory distress.     Breath sounds: No wheezing.  Abdominal:     General: Bowel sounds are normal. There is no distension.     Palpations: Abdomen is soft. There is no mass.     Tenderness: There is no abdominal tenderness. There is no guarding or rebound.  Musculoskeletal:        General: No tenderness. Normal range of motion.     Cervical back: Normal range of motion and neck supple.  Skin:    General: Skin is warm.  Neurological:     Mental Status: She is alert and oriented to person, place, and time.  Psychiatric:        Mood and Affect: Affect normal.     LABORATORY DATA:  I have reviewed the data as listed Lab Results  Component Value Date   WBC 7.7 11/09/2021   HGB 15.6 (H) 11/09/2021   HCT 47.0 (H) 11/09/2021   MCV 84.5 11/09/2021   PLT 195 11/09/2021   Recent Labs    01/25/21 1048 04/04/21 1129 04/08/21 0026 04/11/21 0022 11/09/21 0938  NA 141   < > 138 139 139  K 3.4*   < > 3.5 3.8 3.9  CL 100   < > 105 104 102  CO2 22   < > 27 26 25   GLUCOSE 136*   < > 119* 101* 112*  BUN 16   < > 16 17 19   CREATININE 0.78   < > 0.84 0.79 0.63  CALCIUM 10.0   < > 8.9 9.3 9.8  GFRNONAA 81   < > >60 >60 >60  GFRAA 93  --   --   --   --   PROT 6.8   < > 6.0* 6.0* 7.3  ALBUMIN 4.3   < > 3.1* 3.0* 4.0  AST 24   < > 26 25 27   ALT 25   < > 18 22 21   ALKPHOS 115   < > 59 92 93  BILITOT 0.7   < > 0.5 0.9 0.6   < > = values in this interval not displayed.    RADIOGRAPHIC STUDIES: I have personally reviewed the radiological images as listed and agreed with the findings in the report. CT Chest Wo Contrast  Result Date: 11/08/2021 CLINICAL DATA:  Follow-up lung cancer, status post lower lobectomy EXAM: CT CHEST WITHOUT CONTRAST TECHNIQUE: Multidetector CT imaging of the chest was performed following the standard protocol without IV contrast.  COMPARISON:  02/10/2021, 07/15/2019 FINDINGS: Cardiovascular: Aortic atherosclerosis. Normal heart size. Three-vessel coronary artery calcifications. No pericardial effusion. Mediastinum/Nodes: No enlarged mediastinal, hilar, or axillary lymph nodes. Thyroid gland, trachea, and esophagus demonstrate no significant findings. Lungs/Pleura: Status post interval left lower lobectomy. Mild centrilobular emphysema. Diffuse bilateral bronchial wall thickening. Background of very extensive fine centrilobular nodularity most concentrated in the lung apices. Mixed solid and ground-glass nodule of the dependent right lower lobe measuring 1.1 x 0.8 cm, central solid component measuring 0.3 cm (series 3, image 90). Stable, benign 0.6 cm pulmonary nodule of the dependent right lower lobe (series 3, image 98). No pleural effusion or pneumothorax. Upper Abdomen: No acute abnormality. Musculoskeletal: No chest wall mass or suspicious bone lesions identified. IMPRESSION: 1. Status post interval left lower lobectomy. 2. Mixed solid and ground-glass nodule of the dependent right lower lobe measuring 1.1 x 0.8 cm, central solid component measuring 0.3 cm. This is not significantly changed on examinations dating back to 07/15/2019  but is morphologically suspicious for indolent adenocarcinoma. Annual CT is recommended until 5 years of stability has been established. If persistent these nodules should be considered highly suspicious if the solid component of the nodule is 6 mm or greater in size and enlarging. This recommendation follows the consensus statement: Guidelines for Management of Incidental Pulmonary Nodules Detected on CT Images: From the Fleischner Society 2017; Radiology 2017; 284:228-243. 3. Background of very extensive fine centrilobular nodularity most concentrated in the lung apices, most commonly seen in smoking-related respiratory bronchiolitis. 4. Diffuse bilateral bronchial wall thickening, consistent with nonspecific  infectious or inflammatory bronchitis. 5. Emphysema. 6. Coronary artery disease. Aortic Atherosclerosis (ICD10-I70.0) and Emphysema (ICD10-J43.9). Electronically Signed   By: Delanna Ahmadi M.D.   On: 11/08/2021 15:53     ASSESSMENT & PLAN:   Primary cancer of left lower lobe of lung ([facility]) #Left lower lobe-adenocarcinoma stage I. CT scan 11/07/2021-  Status post interval left lower lobectomy-no evidence of any recurrent cancer.  See discussion below regarding right lower lobe lung nodules  # RLL- Mixed solid and ground-glass nodule of the dependent right lower lobe measuring 1.1 x 0.8 cm, central solid component measuring 0.3 cm. This is not significantly changed on examinations dating back to 07/15/2019. Recommend repeat CT in 6 months.   # URI/bronchiolitis from ongoing smoking- recommend claritin +D.  Again recommended quitting smoking/see below  # Post throacotomy pain/tingling and numbness:  Continue gabapentin -300 mg TID; and then at extra at night.    # CAD/ PVD-clinically stable.  # COPD-stable encouraged continue to avoid smoking; again counseled to quit smoking; recommend evaluation with pulmonary.   #Incidental findings on CT Imaging dated: 12/05/ 2022: emphysema; I reviewed/discussed/counseled the patient.   #Right ovarian mass [PET scan 2021] ~5.8 cm; followed by gynecology.  Awaiting repeat ultrasound in June.   # DISPOSITION: # follow up in 6 months- MD; labs- cbc/cmp; CT chest prior-Dr..B  # I reviewed the blood work- with the patient in detail; also reviewed the imaging independently [as summarized above]; and with the patient in detail.   All questions were answered. The patient knows to call the clinic with any problems, questions or concerns.    Laura Sickle, MD 11/09/2021 1:13 PM

## 2021-11-15 ENCOUNTER — Encounter: Payer: Medicare Other | Admitting: Thoracic Surgery (Cardiothoracic Vascular Surgery)

## 2021-11-22 ENCOUNTER — Other Ambulatory Visit: Payer: Self-pay | Admitting: Gastroenterology

## 2021-12-02 ENCOUNTER — Ambulatory Visit: Payer: Self-pay

## 2021-12-02 ENCOUNTER — Other Ambulatory Visit: Payer: Self-pay | Admitting: Family Medicine

## 2021-12-02 ENCOUNTER — Telehealth: Payer: Medicare Other | Admitting: Nurse Practitioner

## 2021-12-02 DIAGNOSIS — J40 Bronchitis, not specified as acute or chronic: Secondary | ICD-10-CM

## 2021-12-02 MED ORDER — AZITHROMYCIN 250 MG PO TABS: ORAL_TABLET | ORAL | 0 refills | Status: AC

## 2021-12-02 NOTE — Telephone Encounter (Signed)
Please refer to [facility]

## 2021-12-02 NOTE — Progress Notes (Signed)
Virtual Visit Consent   Laura Nielsen, you are scheduled for a virtual visit with a [facility] provider today.     Just as with appointments in the office, your consent must be obtained to participate.  Your consent will be active for this visit and any virtual visit you may have with one of our providers in the next 365 days.     If you have a MyChart account, a copy of this consent can be sent to you electronically.  All virtual visits are billed to your insurance company just like a traditional visit in the office.    As this is a virtual visit, video technology does not allow for your provider to perform a traditional examination.  This may limit your provider's ability to fully assess your condition.  If your provider identifies any concerns that need to be evaluated in person or the need to arrange testing (such as labs, EKG, etc.), we will make arrangements to do so.     Although advances in technology are sophisticated, we cannot ensure that it will always work on either your end or our end.  If the connection with a video visit is poor, the visit may have to be switched to a telephone visit.  With either a video or telephone visit, we are not always able to ensure that we have a secure connection.     I need to obtain your verbal consent now.   Are you willing to proceed with your visit today?    Laura Nielsen has provided verbal consent on 12/02/2021 for a virtual visit (video or telephone).   Apolonio Schneiders, FNP   Date: 12/02/2021 4:56 PM   Virtual Visit via Video Note   I, Apolonio Schneiders, connected with  Laura Nielsen  ([id number], 1956-04-01) on 12/02/21 at  5:00 PM EST by a video-enabled telemedicine application and verified that I am speaking with the correct person using two identifiers.  Location: Patient: Virtual Visit Location Patient: Home Provider: Virtual Visit Location Provider: Home Office   I discussed the limitations of evaluation and management by telemedicine and the  availability of in person appointments. The patient expressed understanding and agreed to proceed.    History of Present Illness: Laura Nielsen is a 65 y.o. who identifies as a female who was assigned female at birth, and is being seen today with complaints of a cough that feels similar to her episodes of bronchitis in the past.   She has had a dry cough that she felt may be her allergies.  Last night she started to wheeze and today has had more of a productive cough with darker mucous.   This has been her pattern for bronchitis in the past. She has emphysema and gets bronchitis about once a year.   She has a history of smoking and had lower lobe lobectomy in May of this year.   She does have an albuterol inhaler she has been using.   She took a home COVID test today that was negative.   She typically takes Azithromycin and has good outcomes, has needed prednisone once in the past.   Problems:  Patient Active Problem List   Diagnosis Date Noted   Secondary osteoarthritis of multiple sites 05/25/2021   Chronic use of opiate for therapeutic purpose 05/24/2021   Primary cancer of left lower lobe of lung ([place]) 05/09/2021   Nodule of lower lobe of left lung 04/06/2021   S/P robot-assisted surgical procedure 04/06/2021   Dysphagia 03/15/2021  Gastric erythema 03/15/2021   Columnar epithelial-lined lower esophagus 03/15/2021   Polyp of colon 03/15/2021   Positive colorectal cancer screening using Cologuard test 10/10/2020   Complex ovarian cyst 09/22/2020   Adnexal mass 09/22/2020   Ovarian cyst, right 08/31/2020   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  08/31/2020   Pharmacologic therapy 08/19/2020   Disorder of skeletal system 08/19/2020   Problems influencing health status 08/19/2020   Fatty liver 08/09/2020   Recurrent major depressive disorder, in partial remission ([place]) 04/20/2020   Aortic atherosclerosis ([place]) 07/17/2019   Unspecified inflammatory spondylopathy, lumbar  region ([place]) 10/04/2018   Abnormal MRI, lumbar spine (03/28/2017) 07/04/2017   Abnormal MRI, cervical spine (03/28/2017) 07/04/2017   Lumbar facet syndrome (Bilateral) (L>R) 04/26/2017   Lumbar facet hypertrophy (multilevel) (Bilateral) 04/26/2017   Long term current use of anticoagulant therapy 04/26/2017   Neurogenic pain 04/26/2017   Musculoskeletal pain 04/26/2017   Chronic pain syndrome 03/08/2017   Long term prescription opiate use 03/08/2017   Opiate use 03/08/2017   Chronic low back pain (1ry area of Pain) (Bilateral) (L>R) w/o sciatica 03/08/2017   Chronic neck pain (2ry area of Pain) (Bilateral) (R>L) 03/08/2017   Cervicogenic headache (Right) 03/08/2017   Chronic upper back pain (3ry area of Pain) (Bilateral) (R>L) 03/08/2017   Chronic hip pain (Bilateral) (L>R) 03/08/2017   Osteoarthritis of hip (Bilateral) (L>R) 03/08/2017   Cervical central spinal stenosis 03/08/2017   Cervical spondylosis with radiculopathy (Right) (C5) 03/08/2017   Lumbar spondylosis 03/08/2017   Atherosclerosis of native arteries of extremity with intermittent claudication ([place]) 02/22/2017   Gout 02/16/2016   Ankle pain 11/17/2015   Arthritis 05/14/2015   Carotid artery narrowing 05/14/2015   Claudication ([place]) 05/14/2015   CAFL (chronic airflow limitation) ([place]) 05/14/2015   Clinical depression 05/14/2015   Emphysema lung ([place]) 05/14/2015   Acid reflux 05/14/2015   Urinary incontinence 05/14/2015   Pins and needles sensation 05/14/2015   Obstructive apnea 05/14/2015   Severe obesity (BMI 35.0-39.9) with comorbidity ([place]) 05/14/2015   History of abnormal cervical Papanicolaou smear 05/14/2015   Peripheral blood vessel disorder ([place]) 05/14/2015   B12 deficiency 05/14/2015   Vitamin D insufficiency 05/14/2015   AAA (abdominal aortic aneurysm) without rupture ([place]) 05/26/2014   Aortic heart valve narrowing 01/31/2013   Malaise and fatigue 09/26/2012   CAD in native artery 03/17/2009   Diabetes  mellitus, type 2 ([place]) 02/05/2009   Hypercholesteremia 06/24/2008   Allergic rhinitis 10/25/2007   Airway hyperreactivity 10/25/2007   Smoking greater than 30 pack years 10/25/2007   Narrowing of intervertebral disc space 10/25/2007   Benign essential HTN 10/25/2007   Arthritis, degenerative 10/25/2007    Allergies:  Allergies  Allergen Reactions   Atorvastatin Other (See Comments)    Elevated blood sugar    Omeprazole Nausea And Vomiting   Bupropion Nausea Only   Medications:  Current Outpatient Medications:    acetaminophen (TYLENOL) 500 MG tablet, Take 2 tablets (1,000 mg total) by mouth every 6 (six) hours., Disp: 30 tablet, Rfl: 0   acetaminophen-codeine (TYLENOL #3) 300-30 MG tablet, Take 1 tablet by mouth every 8 (eight) hours as needed for moderate pain. (Patient not taking: Reported on 11/09/2021), Disp: 30 tablet, Rfl: 0   allopurinol (ZYLOPRIM) 100 MG tablet, Take 1 tablet (100 mg total) by mouth daily., Disp: 90 tablet, Rfl: 4   aspirin 81 MG tablet, Take 81 mg by mouth daily. , Disp: , Rfl:    bisoprolol (ZEBETA) 10 MG tablet, Take  1 tablet (10 mg total) by mouth daily., Disp: 90 tablet, Rfl: 0   Cholecalciferol 25 MCG (1000 UT) capsule, Take 1,000 Units by mouth daily., Disp: , Rfl:    clopidogrel (PLAVIX) 75 MG tablet, Take 1 tablet (75 mg total) by mouth daily., Disp: 90 tablet, Rfl: 1   cyanocobalamin 1000 MCG tablet, Take 1,000 mcg by mouth daily., Disp: , Rfl:    gabapentin (NEURONTIN) 300 MG capsule, Take 1 capsule (300 mg total) by mouth 3 (three) times daily AND 2 capsules (600 mg total) at bedtime., Disp: , Rfl:    ibuprofen (ADVIL) 200 MG tablet, Take 400 mg by mouth 2 (two) times daily as needed (headaches)., Disp: , Rfl:    lisinopril-hydrochlorothiazide (ZESTORETIC) 20-12.5 MG tablet, Take 1 tablet by mouth daily., Disp: 90 tablet, Rfl: 4   OZEMPIC, 1 MG/DOSE, 4 MG/3ML SOPN, Inject 1 mg into the skin once a week., Disp: 3 mL, Rfl: 0   pantoprazole (PROTONIX)  20 MG tablet, Take 1 tablet (20 mg total) by mouth daily., Disp: 90 tablet, Rfl: 0   Polyethyl Glycol-Propyl Glycol (LUBRICATING EYE DROPS) 0.4-0.3 % SOLN, Place 1 drop into both eyes daily as needed (dry eyes)., Disp: , Rfl:    PROAIR HFA 108 (90 Base) MCG/ACT inhaler, Inhale 2 puffs into the lungs every 6 (six) hours as needed for wheezing or shortness of breath., Disp: 8.5 g, Rfl: 4   rosuvastatin (CRESTOR) 20 MG tablet, Take 1 tablet (20 mg total) by mouth daily., Disp: 90 tablet, Rfl: 4  Observations/Objective: Patient is well-developed, well-nourished in no acute distress.  Resting comfortably at home.  Head is normocephalic, atraumatic.  No labored breathing.  Speech is clear and coherent with logical content.  Patient is alert and oriented at baseline.    Assessment and Plan: 1. Bronchitis  - azithromycin (ZITHROMAX) 250 MG tablet; Take 2 tablets on day 1, then 1 tablet daily on days 2 through 5  Dispense: 6 tablet; Refill: 0  Continue inhaler use as needed  Follow up for new or worsening symptoms as needed     Follow Up Instructions: I discussed the assessment and treatment plan with the patient. The patient was provided an opportunity to ask questions and all were answered. The patient agreed with the plan and demonstrated an understanding of the instructions.  A copy of instructions were sent to the patient via MyChart unless otherwise noted below.     The patient was advised to call back or seek an in-person evaluation if the symptoms worsen or if the condition fails to improve as anticipated.  Time:  I spent 10 minutes with the patient via telehealth technology discussing the above problems/concerns.    Apolonio Schneiders, FNP

## 2021-12-02 NOTE — Telephone Encounter (Signed)
Requested medication (s) are due for refill today:   Old order from 2018  Requested medication (s) are on the active medication list:   Yes  Future visit scheduled:   No   Last ordered: 04/12/2017 18 g, 11 refills  Returned because this is from 2018.      Requested Prescriptions  Pending Prescriptions Disp Refills   PROAIR HFA 108 (90 Base) MCG/ACT inhaler [Pharmacy Med Name: PROAIR HFA 90 MCG INHALER] 8.5 g 0    Sig: Inhale 2 puffs into the lungs every 6 (six) hours as needed for wheezing or shortness of breath.     Pulmonology:  Beta Agonists Failed - 12/02/2021 10:54 AM      Failed - One inhaler should last at least one month. If the patient is requesting refills earlier, contact the patient to check for uncontrolled symptoms.      Passed - Valid encounter within last 12 months    Recent Outpatient Visits           4 months ago Benign essential HTN   [facility] Birdie Sons, MD   10 months ago Type 2 diabetes mellitus without complication, without long-term current use of insulin [facility])   [facility] Birdie Sons, MD   1 year ago Type 2 diabetes mellitus without complication, without long-term current use of insulin [facility])   [facility] Birdie Sons, MD   1 year ago CAFL (chronic airflow limitation) [facility])   [place], [place], PA-C   1 year ago Type 2 diabetes mellitus without complication, without long-term current use of insulin [facility])   [facility] Birdie Sons, MD

## 2021-12-02 NOTE — Telephone Encounter (Signed)
°  Chief Complaint: Productive cough with yellow mucus, runny nose, shortness of breath Symptoms:same Frequency: Started yesterday Pertinent Negatives: Patient denies fever Disposition: [] ED /[] Urgent Care (no appt availability in office) / [] Appointment(In office/virtual)/ []  [facility]/ [] Home Care/ [] Refused Recommended Disposition /[]  Mobile Bus/ []  Follow-up with PCP Additional Notes: Requesting work-in with virtual visit or medication "for bronchitis be called in." Home COVID test negative.   Answer Assessment - Initial Assessment Questions 1. ONSET: "When did the cough begin?"      Yesterday 2. SEVERITY: "How bad is the cough today?"      Severe 3. SPUTUM: "Describe the color of your sputum" (none, dry cough; clear, white, yellow, green)     Yellow 4. HEMOPTYSIS: "Are you coughing up any blood?" If so ask: "How much?" (flecks, streaks, tablespoons, etc.)     No 5. DIFFICULTY BREATHING: "Are you having difficulty breathing?" If Yes, ask: "How bad is it?" (e.g., mild, moderate, severe)    - MILD: No SOB at rest, mild SOB with walking, speaks normally in sentences, can lie down, no retractions, pulse < 100.    - MODERATE: SOB at rest, SOB with minimal exertion and prefers to sit, cannot lie down flat, speaks in phrases, mild retractions, audible wheezing, pulse 100-120.    - SEVERE: Very SOB at rest, speaks in single words, struggling to breathe, sitting hunched forward, retractions, pulse > 120      Mild-moderate 6. FEVER: "Do you have a fever?" If Yes, ask: "What is your temperature, how was it measured, and when did it start?"     No 7. CARDIAC HISTORY: "Do you have any history of heart disease?" (e.g., heart attack, congestive heart failure)      Yes 8. LUNG HISTORY: "Do you have any history of lung disease?"  (e.g., pulmonary embolus, asthma, emphysema)     Emphysema  9. PE RISK FACTORS: "Do you have a history of blood clots?" (or: recent major surgery,  recent prolonged travel, bedridden)     No 10. OTHER SYMPTOMS: "Do you have any other symptoms?" (e.g., runny nose, wheezing, chest pain)       Wheezing, runny nose 11. PREGNANCY: "Is there any chance you are pregnant?" "When was your last menstrual period?"       No 12. TRAVEL: "Have you traveled out of the country in the last month?" (e.g., travel history, exposures)       No  Protocols used: Cough - Acute Productive-A-AH

## 2021-12-12 ENCOUNTER — Telehealth: Payer: Self-pay | Admitting: *Deleted

## 2021-12-12 NOTE — Telephone Encounter (Signed)
Patient contacted the office regarding her upcoming appt with Dr. Roxan Hockey 1/24. Per patient, she has been "run ragged" lately due to family circumstances and requests to push back her 4 month f/u appt with Dr. Roxan Hockey. Appt pushed back to February per patient request. Patient aware of new appt date and time.

## 2021-12-20 ENCOUNTER — Other Ambulatory Visit: Payer: Self-pay | Admitting: Gastroenterology

## 2021-12-22 ENCOUNTER — Other Ambulatory Visit: Payer: Self-pay | Admitting: Family Medicine

## 2021-12-22 ENCOUNTER — Encounter: Payer: Self-pay | Admitting: Family Medicine

## 2021-12-22 NOTE — Telephone Encounter (Signed)
Pt called to see if Dr Caryn Section would be ok refilling her RX for  pantoprazole (PROTONIX) 20 MG tablet/ she stated that her [facility] provider, Dr. Bonna Gains normally refills it but has left her practice and the pt has not been reassigned another provider  and just found out her provider has left / please advise and send to [employer]

## 2021-12-23 MED ORDER — PANTOPRAZOLE SODIUM 20 MG PO TBEC: 20.0000 mg | DELAYED_RELEASE_TABLET | Freq: Every day | ORAL | 4 refills | Status: DC

## 2021-12-27 ENCOUNTER — Encounter: Payer: Medicare Other | Admitting: Thoracic Surgery (Cardiothoracic Vascular Surgery)

## 2021-12-29 ENCOUNTER — Other Ambulatory Visit: Payer: Self-pay

## 2021-12-29 ENCOUNTER — Ambulatory Visit (INDEPENDENT_AMBULATORY_CARE_PROVIDER_SITE_OTHER): Payer: Medicare Other | Admitting: Physician Assistant

## 2021-12-29 VITALS — BP 146/81 | HR 68 | Temp 98.3°F | Resp 16 | Ht 68.0 in | Wt 239.4 lb

## 2021-12-29 DIAGNOSIS — J309 Allergic rhinitis, unspecified: Secondary | ICD-10-CM | POA: Diagnosis not present

## 2021-12-29 DIAGNOSIS — R051 Acute cough: Secondary | ICD-10-CM

## 2021-12-29 MED ORDER — BENZONATATE 100 MG PO CAPS
100.0000 mg | ORAL_CAPSULE | Freq: Two times a day (BID) | ORAL | 0 refills | Status: DC | PRN
Start: 2021-12-29 — End: 2022-02-13

## 2021-12-29 MED ORDER — MONTELUKAST SODIUM 10 MG PO TABS: 10.0000 mg | ORAL_TABLET | Freq: Every day | ORAL | 3 refills | Status: DC

## 2021-12-29 NOTE — Progress Notes (Signed)
Established patient visit  I,Laura Nielsen,acting as a scribe for [employer], PA-C.,have documented all relevant documentation on the behalf of [place], PA-C,as directed by  [name] E [name], PA-C while in the presence of [name] E [place], PA-C.   Patient: Laura Nielsen   DOB: [birth year]   66 y.o. Female  MRN: [id number] Visit Date: 12/29/2021  Today's healthcare provider: [name], PA-C   Introduced myself to the patient as a Journalist, newspaper and provided education on APPs in clinical practice.    Chief Complaint  Patient presents with   Ear Pain   Sore Throat   Cough   Bronchitis   Subjective    Otalgia  There is pain in the left ear. This is a new problem. The current episode started in the past 7 days (2 days ago). The problem has been gradually worsening. There has been no fever. Associated symptoms include coughing, rhinorrhea and a sore throat. Pertinent negatives include no abdominal pain, diarrhea, ear discharge, headaches, hearing loss, rash or vomiting.    Patient has had left ear pain for 2 days. Patient also has symptoms of sore throat and cough. Patient states she has bronchitis a few weeks ago. Cough has not completely cleared up.  States her cough has been productive and is especially prevalent in the AM when she gets up She has not been taking anything for this as she was concerned for interactions with her other medications States she has been using inhaler more frequently at night  She states she quit smoking before she had surgery but had relapsed recently  States she is not currently smoking at this time States BP is usually 120s/80s at other office visits.    Medications: Outpatient Medications Prior to Visit  Medication Sig   acetaminophen-codeine (TYLENOL #3) 300-30 MG tablet Take 1 tablet by mouth every 8 (eight) hours as needed for moderate pain.   allopurinol (ZYLOPRIM) 100 MG tablet Take 1 tablet (100 mg total) by mouth daily.   aspirin 81 MG tablet  Take 81 mg by mouth daily.    bisoprolol (ZEBETA) 10 MG tablet Take 1 tablet (10 mg total) by mouth daily.   Cholecalciferol 25 MCG (1000 UT) capsule Take 1,000 Units by mouth daily.   clopidogrel (PLAVIX) 75 MG tablet Take 1 tablet (75 mg total) by mouth daily.   cyanocobalamin 1000 MCG tablet Take 1,000 mcg by mouth daily.   gabapentin (NEURONTIN) 300 MG capsule Take 1 capsule (300 mg total) by mouth 3 (three) times daily AND 2 capsules (600 mg total) at bedtime.   ibuprofen (ADVIL) 200 MG tablet Take 400 mg by mouth 2 (two) times daily as needed (headaches).   lisinopril-hydrochlorothiazide (ZESTORETIC) 20-12.5 MG tablet Take 1 tablet by mouth daily.   OZEMPIC, 1 MG/DOSE, 4 MG/3ML SOPN Inject 1 mg into the skin once a week.   pantoprazole (PROTONIX) 20 MG tablet Take 1 tablet (20 mg total) by mouth daily.   Polyethyl Glycol-Propyl Glycol (LUBRICATING EYE DROPS) 0.4-0.3 % SOLN Place 1 drop into both eyes daily as needed (dry eyes).   PROAIR HFA 108 (90 Base) MCG/ACT inhaler Inhale 2 puffs into the lungs every 6 (six) hours as needed for wheezing or shortness of breath.   rosuvastatin (CRESTOR) 20 MG tablet Take 1 tablet (20 mg total) by mouth daily.   [DISCONTINUED] acetaminophen (TYLENOL) 500 MG tablet Take 2 tablets (1,000 mg total) by mouth every 6 (six) hours. (Patient not taking: Reported  on 12/29/2021)   No facility-administered medications prior to visit.    Review of Systems  Constitutional:  Positive for fatigue. Negative for fever.  HENT:  Positive for ear pain, rhinorrhea and sore throat. Negative for ear discharge, hearing loss, sinus pressure and sinus pain.   Respiratory:  Positive for cough and shortness of breath.   Cardiovascular:  Negative for chest pain.  Gastrointestinal:  Negative for abdominal pain, diarrhea, nausea and vomiting.  Musculoskeletal:  Negative for arthralgias and myalgias.  Skin:  Negative for rash.  Neurological:  Negative for headaches.       Objective    BP (!) 146/81 (BP Location: Right Arm, Patient Position: Sitting, Cuff Size: Normal)    Pulse 68    Temp 98.3 F (36.8 C) (Temporal)    Resp 16    Ht 5\' 8"  (1.727 m)    Wt 239 lb 6.4 oz (108.6 kg)    SpO2 98%    BMI 36.40 kg/m  {Show previous vital signs (optional):23777}  Physical Exam Vitals reviewed.  Constitutional:      Appearance: She is well-developed. She is obese.  HENT:     Head: Normocephalic and atraumatic.     Right Ear: Hearing, ear canal and external ear normal. A middle ear effusion is present. Tympanic membrane is erythematous and bulging. Tympanic membrane is not retracted.     Left Ear: Hearing, ear canal and external ear normal. A middle ear effusion is present. Tympanic membrane is erythematous. Tympanic membrane is not retracted.     Mouth/Throat:     Mouth: Mucous membranes are moist.     Pharynx: Oropharynx is clear. Uvula midline. No pharyngeal swelling, oropharyngeal exudate, posterior oropharyngeal erythema or uvula swelling.  Cardiovascular:     Rate and Rhythm: Normal rate and regular rhythm.     Pulses: Normal pulses.     Heart sounds: Normal heart sounds.  Pulmonary:     Effort: Pulmonary effort is normal.     Breath sounds: Normal breath sounds and air entry. No decreased breath sounds, wheezing, rhonchi or rales.  Musculoskeletal:     Right lower leg: No edema.     Left lower leg: No edema.  Lymphadenopathy:     Head:     Right side of head: No submental or submandibular adenopathy.     Left side of head: No submental or submandibular adenopathy.     Upper Body:     Right upper body: No supraclavicular adenopathy.     Left upper body: No supraclavicular adenopathy.  Neurological:     Mental Status: She is alert.      No results found for any visits on 12/29/21.  Assessment & Plan     1. Acute cough Acute, new problem, likely secondary to URI  Recommend continued use of OTC medications for relief per patient preference   Patient reports negative home COVID testing, skeptical for flu at this time, likely viral in nature.  Provided Tessalon pearls to assist with cough management Provided Montelukast to assist with allergen/breathing related cough Patient can continue rescue inhaler as needed for coughing and SOB    - benzonatate (TESSALON) 100 MG capsule; Take 1 capsule (100 mg total) by mouth 2 (two) times daily as needed for cough.  Dispense: 20 capsule; Refill: 0  2. Allergic rhinitis, unspecified seasonality, unspecified trigger Chronic, historic problem  May be undergoing acute exacerbation with cough, rhinorrhea, and sore throat.  Provided Montelukast to assist with allergic rhinitis and breathing  concerns- likely will assist with rhinorrhea and SOB   - montelukast (SINGULAIR) 10 MG tablet; Take 1 tablet (10 mg total) by mouth at bedtime.  Dispense: 30 tablet; Refill: [place], [place] [phone] (phone) [phone] (fax)  [place]

## 2021-12-29 NOTE — Patient Instructions (Addendum)
Based on your described symptoms and the duration of symptoms it is likely that you have a viral upper respiratory infection (often called a "cold")  Symptoms can last for 3-10 days with lingering cough and intermittent symptoms lasting weeks after that.  The goal of treatment at this time is to reduce your symptoms and discomfort   I have sent in Tessalon pearls for you to take twice per day to help with your cough I have sent in Montelukast for your sinus symptoms and rhinitis - it can also help with breathing   You can use over the counter medications such as Dayquil/Nyquil, AlkaSeltzer formulations, etc to provide further relief of symptoms according to the manufacturer's instructions  If preferred you can use Coricidin to manage your symptoms rather than those medications mentioned above.    If your symptoms do not improve or become worse in the next 5-7 days please make an apt at the office so we can see you  Go to the ER if you begin to have more serious symptoms such as shortness of breath, trouble breathing, loss of consciousness, swelling around the eyes, high fever, severe lasting headaches, vision changes or neck pain/stiffness.   It was nice to meet you and I appreciate the opportunity to be involved in your care

## 2022-01-09 ENCOUNTER — Other Ambulatory Visit: Payer: Self-pay | Admitting: Family Medicine

## 2022-01-09 DIAGNOSIS — I10 Essential (primary) hypertension: Secondary | ICD-10-CM

## 2022-01-11 DIAGNOSIS — Z20822 Contact with and (suspected) exposure to covid-19: Secondary | ICD-10-CM | POA: Diagnosis not present

## 2022-01-19 ENCOUNTER — Other Ambulatory Visit: Payer: Self-pay | Admitting: Family Medicine

## 2022-01-20 ENCOUNTER — Encounter: Payer: Self-pay | Admitting: Family Medicine

## 2022-01-23 ENCOUNTER — Other Ambulatory Visit: Payer: Self-pay | Admitting: Family Medicine

## 2022-01-23 DIAGNOSIS — Z1231 Encounter for screening mammogram for malignant neoplasm of breast: Secondary | ICD-10-CM

## 2022-01-24 ENCOUNTER — Ambulatory Visit (INDEPENDENT_AMBULATORY_CARE_PROVIDER_SITE_OTHER): Payer: Medicare Other | Admitting: Thoracic Surgery (Cardiothoracic Vascular Surgery)

## 2022-01-24 ENCOUNTER — Other Ambulatory Visit: Payer: Self-pay

## 2022-01-24 ENCOUNTER — Encounter: Payer: Self-pay | Admitting: Thoracic Surgery (Cardiothoracic Vascular Surgery)

## 2022-01-24 VITALS — BP 116/76 | HR 80 | Resp 20 | Ht 68.0 in | Wt 235.0 lb

## 2022-01-24 DIAGNOSIS — C3432 Malignant neoplasm of lower lobe, left bronchus or lung: Secondary | ICD-10-CM | POA: Diagnosis not present

## 2022-01-24 DIAGNOSIS — Z9889 Other specified postprocedural states: Secondary | ICD-10-CM | POA: Diagnosis not present

## 2022-01-24 NOTE — Progress Notes (Signed)
[address]       [place] [address]             [phone]       HPI: Laura Nielsen returns for a scheduled follow-up visit  Laura Nielsen is a 66 year old woman with a history of tobacco abuse, lung nodules, obesity, CAD, hypertension, hyperlipidemia, asthma, reflux, type 2 diabetes, sleep apnea, osteoporosis, depression, and chronic pain.  She had an 80-pack-year history of smoking before quitting in May.  She was noted to have a cystic lesion in the left lower lobe in August 2021.  There was no metabolic activity on PET.  However on a follow-up CT the nodule increased in size.  I did a robotic left lower lobectomy on Apr 06, 2021.  The nodule was a stage Ia adenocarcinoma.  She saw Dr. Rogue Bussing.  She does not need any adjuvant therapy.  I last saw her in the office on 08/02/2021.  She still had a fair amount of postoperative pain.  In the interim since her last visit she has been feeling well.  She has lost some weight.  She is not smoking.  She no longer has incisional pain.  Past Medical History:  Diagnosis Date   AAA (abdominal aortic aneurysm)    s/p EVAR AAA 03/27/13   Arthritis    Asthma    Complication of anesthesia    BP drops after   History of kidney stones    Osteoporosis    Sleep apnea     Current Outpatient Medications  Medication Sig Dispense Refill   acetaminophen-codeine (TYLENOL #3) 300-30 MG tablet Take 1 tablet by mouth every 8 (eight) hours as needed for moderate pain. 30 tablet 0   allopurinol (ZYLOPRIM) 100 MG tablet Take 1 tablet (100 mg total) by mouth daily. 90 tablet 4   aspirin 81 MG tablet Take 81 mg by mouth daily.      benzonatate (TESSALON) 100 MG capsule Take 1 capsule (100 mg total) by mouth 2 (two) times daily as needed for cough. 20 capsule 0   bisoprolol (ZEBETA) 10 MG tablet Take 1 tablet (10 mg total) by mouth daily. 90 tablet 0   Cholecalciferol 25 MCG (1000 UT) capsule Take 1,000 Units by mouth daily.      clopidogrel (PLAVIX) 75 MG tablet Take 1 tablet (75 mg total) by mouth daily. 90 tablet 1   cyanocobalamin 1000 MCG tablet Take 1,000 mcg by mouth daily.     gabapentin (NEURONTIN) 300 MG capsule Take 1 capsule (300 mg total) by mouth 3 (three) times daily AND 2 capsules (600 mg total) at bedtime.     ibuprofen (ADVIL) 200 MG tablet Take 400 mg by mouth 2 (two) times daily as needed (headaches).     lisinopril-hydrochlorothiazide (ZESTORETIC) 20-12.5 MG tablet Take 1 tablet by mouth daily. 90 tablet 4   montelukast (SINGULAIR) 10 MG tablet Take 1 tablet (10 mg total) by mouth at bedtime. 30 tablet 3   OZEMPIC, 1 MG/DOSE, 4 MG/3ML SOPN Inject 1 mg into the skin once a week. 3 mL 0   pantoprazole (PROTONIX) 20 MG tablet Take 1 tablet (20 mg total) by mouth daily. 90 tablet 4   Polyethyl Glycol-Propyl Glycol (LUBRICATING EYE DROPS) 0.4-0.3 % SOLN Place 1 drop into both eyes daily as needed (dry eyes).     PROAIR HFA 108 (90 Base) MCG/ACT inhaler Inhale 2 puffs into the lungs every 6 (six) hours as needed for wheezing  or shortness of breath. 8.5 g 4   rosuvastatin (CRESTOR) 20 MG tablet Take 1 tablet (20 mg total) by mouth daily. 90 tablet 4   No current facility-administered medications for this visit.    Physical Exam BP 116/76    Pulse 80    Resp 20    Ht 5\' 8"  (1.727 m)    Wt 235 lb (106.6 kg)    SpO2 93% Comment: RA   BMI 35.73 kg/m  Obese 66 year old woman in no acute distress Alert and oriented x3 with no focal deficits Lungs diminished at left base but otherwise clear Cardiac regular rate and rhythm Incisions well-healed No cervical or supraclavicular adenopathy  Diagnostic Tests: No studies done today.  Reviewed CT from December.  There is a mixed density nodule in the base of the right lower lobe that is stable.  Dr. Rogue Bussing has scheduled follow-up  Impression: Laura Nielsen is a 66 year old woman with a history of tobacco abuse, lung nodules, obesity, CAD, hypertension,  hyperlipidemia, asthma, reflux, type 2 diabetes, sleep apnea, osteoporosis, depression, and chronic pain.  She was first found to have a left lower lobe cystic lung nodule in 2021.  Over time that nodule increased in size.  She underwent a robotic left lower lobectomy on 04/06/2021.    Postoperative pain -resolved.  Stage Ia adenocarcinoma of the lung.  Status post lobectomy.  She did not require adjuvant therapy.  She will be followed by Dr. Rogue Bussing.  Right lower lobe lung nodule-mixed density lesion worrisome for a second primary adenocarcinoma.  No change on serial exams.  She has another CT in 6 months.  If necessary this would be amenable to a wedge resection.  Tobacco abuse-she says she is not smoking.  I congratulated her for that and encouraged her to remain abstinent.  Plan: Follow-up with Dr. Rogue Bussing I will be happy to see her at any time if either she or Dr. Rogue Bussing for Dr. Caryn Section feel I can be of any assistance with her care.   Laura Nakayama, MD [facility] [phone]

## 2022-01-25 DIAGNOSIS — N83201 Unspecified ovarian cyst, right side: Secondary | ICD-10-CM | POA: Diagnosis not present

## 2022-01-27 ENCOUNTER — Other Ambulatory Visit: Payer: Self-pay | Admitting: Obstetrics & Gynecology

## 2022-01-27 DIAGNOSIS — N83201 Unspecified ovarian cyst, right side: Secondary | ICD-10-CM

## 2022-02-10 NOTE — Progress Notes (Unsigned)
I,Roshena L Chambers,acting as a scribe for Lelon Huh, MD.,have documented all relevant documentation on the behalf of Lelon Huh, MD,as directed by  Lelon Huh, MD while in the presence of Lelon Huh, MD.   Annual Wellness Visit     Patient: Laura Nielsen, Female    DOB: 08/20/1956, 66 y.o.   MRN: [id number] Visit Date: 02/13/2022  Today's Provider: Lelon Huh, MD   Chief Complaint  Patient presents with   Medicare Wellness   Hypertension   Subjective    Laura Nielsen is a 66 y.o. female who presents today for her Annual Wellness Visit. She reports consuming a general diet. The patient does not participate in regular exercise at present. She generally feels fairly well. She reports sleeping poorly. She does have additional problems to discuss today.   HPI Hypertension, follow-up  BP Readings from Last 3 Encounters:  02/13/22 134/72  01/24/22 116/76  12/29/21 (!) 146/81   Wt Readings from Last 3 Encounters:  02/13/22 234 lb (106.1 kg)  01/24/22 235 lb (106.6 kg)  12/29/21 239 lb 6.4 oz (108.6 kg)     She was last seen for hypertension 7 months ago.  BP at that visit was 133/74. Management since that visit includes continue same medications.  She reports good compliance with treatment. She is not having side effects.  She is following a Regular diet. She is not exercising. She does smoke.  Use of agents associated with hypertension: NSAIDS.   Outside blood pressures are not checked. Symptoms: No chest pain No chest pressure  No palpitations No syncope  No dyspnea No orthopnea  No paroxysmal nocturnal dyspnea No lower extremity edema   Pertinent labs: Lab Results  Component Value Date   CHOL 136 01/25/2021   HDL 33 (L) 01/25/2021   LDLCALC 77 01/25/2021   TRIG 146 01/25/2021   CHOLHDL 4.1 01/25/2021   Lab Results  Component Value Date   NA 139 11/09/2021   K 3.9 11/09/2021   CREATININE 0.63 11/09/2021   GFRNONAA >60 11/09/2021    GLUCOSE 112 (H) 11/09/2021   TSH 1.86 08/30/2017     The 10-year ASCVD risk score (Arnett DK, et al., 2019) is: 15.4%   ---------------------------------------------------------------------------------------------------   Diabetes Mellitus Type II, Follow-up  Lab Results  Component Value Date   HGBA1C 6.1 (H) 07/12/2021   HGBA1C 5.9 (H) 04/04/2021   HGBA1C 6.2 (H) 01/25/2021   Wt Readings from Last 3 Encounters:  02/13/22 234 lb (106.1 kg)  01/24/22 235 lb (106.6 kg)  12/29/21 239 lb 6.4 oz (108.6 kg)   Last seen for diabetes 7 months ago.  Management since then includes continue same medications. She reports good compliance with treatment. She is not having side effects.  Symptoms: No fatigue No foot ulcerations  No appetite changes No nausea  No paresthesia of the feet  Yes polydipsia  No polyuria No visual disturbances   No vomiting     Home blood sugar records:  blood sugars are not checked  Episodes of hypoglycemia? No    Current insulin regiment: none Most Recent Eye Exam: not UTD Current exercise: none Current diet habits: well balanced  Pertinent Labs: Lab Results  Component Value Date   CHOL 136 01/25/2021   HDL 33 (L) 01/25/2021   LDLCALC 77 01/25/2021   TRIG 146 01/25/2021   CHOLHDL 4.1 01/25/2021   Lab Results  Component Value Date   NA 139 11/09/2021   K 3.9 11/09/2021   CREATININE 0.63  11/09/2021   GFRNONAA >60 11/09/2021   MICROALBUR 20 12/12/2017     ---------------------------------------------------------------------------------------------------   Lipid/Cholesterol, Follow-up  Last lipid panel Other pertinent labs  Lab Results  Component Value Date   CHOL 136 01/25/2021   HDL 33 (L) 01/25/2021   LDLCALC 77 01/25/2021   TRIG 146 01/25/2021   CHOLHDL 4.1 01/25/2021   Lab Results  Component Value Date   ALT 21 11/09/2021   AST 27 11/09/2021   PLT 195 11/09/2021   TSH 1.86 08/30/2017     She was last seen for this 1  year   ago.  Management since that visit includes continue same medication.  She reports good compliance with treatment. She is not having side effects.   Symptoms: No chest pain No chest pressure/discomfort  No dyspnea No lower extremity edema  No numbness or tingling of extremity No orthopnea  No palpitations No paroxysmal nocturnal dyspnea  No speech difficulty No syncope   Current diet: well balanced Current exercise: none  The 10-year ASCVD risk score (Arnett DK, et al., 2019) is: 15.4%  ---------------------------------------------------------------------------------------------------    Medications: Outpatient Medications Prior to Visit  Medication Sig   allopurinol (ZYLOPRIM) 100 MG tablet Take 1 tablet (100 mg total) by mouth daily.   aspirin 81 MG tablet Take 81 mg by mouth daily.    bisoprolol (ZEBETA) 10 MG tablet Take 1 tablet (10 mg total) by mouth daily.   Cholecalciferol 25 MCG (1000 UT) capsule Take 1,000 Units by mouth daily.   clopidogrel (PLAVIX) 75 MG tablet Take 1 tablet (75 mg total) by mouth daily.   cyanocobalamin 1000 MCG tablet Take 1,000 mcg by mouth daily.   gabapentin (NEURONTIN) 300 MG capsule Take 1 capsule (300 mg total) by mouth 3 (three) times daily AND 2 capsules (600 mg total) at bedtime.   ibuprofen (ADVIL) 200 MG tablet Take 400 mg by mouth 2 (two) times daily as needed (headaches).   lisinopril-hydrochlorothiazide (ZESTORETIC) 20-12.5 MG tablet Take 1 tablet by mouth daily.   OZEMPIC, 1 MG/DOSE, 4 MG/3ML SOPN Inject 1 mg into the skin once a week.   pantoprazole (PROTONIX) 20 MG tablet Take 1 tablet (20 mg total) by mouth daily.   PROAIR HFA 108 (90 Base) MCG/ACT inhaler Inhale 2 puffs into the lungs every 6 (six) hours as needed for wheezing or shortness of breath.   rosuvastatin (CRESTOR) 20 MG tablet Take 1 tablet (20 mg total) by mouth daily.   acetaminophen-codeine (TYLENOL #3) 300-30 MG tablet Take 1 tablet by mouth every 8 (eight) hours as  needed for moderate pain. (Patient not taking: Reported on 02/13/2022)   benzonatate (TESSALON) 100 MG capsule Take 1 capsule (100 mg total) by mouth 2 (two) times daily as needed for cough. (Patient not taking: Reported on 02/13/2022)   montelukast (SINGULAIR) 10 MG tablet Take 1 tablet (10 mg total) by mouth at bedtime. (Patient not taking: Reported on 02/13/2022)   Polyethyl Glycol-Propyl Glycol (LUBRICATING EYE DROPS) 0.4-0.3 % SOLN Place 1 drop into both eyes daily as needed (dry eyes). (Patient not taking: Reported on 02/13/2022)   No facility-administered medications prior to visit.    Allergies  Allergen Reactions   Atorvastatin Other (See Comments)    Elevated blood sugar    Omeprazole Nausea And Vomiting   Bupropion Nausea Only    Patient Care Team: Birdie Sons, MD as PCP - General (Family Medicine) Ubaldo Glassing Javier Docker, MD as Consulting Physician (Cardiology) Schnier, Dolores Lory, MD (Vascular Surgery)  Vladimir Crofts, MD as Consulting Physician (Neurology) Milinda Pointer, MD as Referring Physician (Pain Medicine) [facility], [facility]) Ubaldo Glassing, Javier Docker, MD as Consulting Physician (Cardiology) Virgel Manifold, MD (Inactive) as Consulting Physician (Gastroenterology) Gae Dry, MD as Referring Physician (Obstetrics and Gynecology) Telford Nab, RN as Oncology Nurse Navigator  Review of Systems  Constitutional:  Positive for fatigue. Negative for chills and fever.  HENT:  Positive for congestion, dental problem, postnasal drip, rhinorrhea, sinus pressure and tinnitus. Negative for ear pain, sneezing and sore throat.   Eyes: Negative.  Negative for pain and redness.  Respiratory:  Positive for cough. Negative for shortness of breath and wheezing.   Cardiovascular:  Negative for chest pain and leg swelling.  Gastrointestinal:  Positive for constipation. Negative for abdominal pain, blood in stool, diarrhea and nausea.  Endocrine: Positive for polydipsia.  Negative for polyphagia.  Genitourinary: Negative.  Negative for dysuria, flank pain, hematuria, pelvic pain, vaginal bleeding and vaginal discharge.  Musculoskeletal:  Positive for back pain, myalgias and neck pain. Negative for arthralgias, gait problem and joint swelling.  Skin:  Negative for rash.  Allergic/Immunologic: Positive for environmental allergies.  Neurological:  Positive for light-headedness and headaches. Negative for dizziness, tremors, seizures, weakness and numbness.  Hematological:  Negative for adenopathy. Bruises/bleeds easily.  Psychiatric/Behavioral:  Positive for decreased concentration. Negative for behavioral problems, confusion and dysphoric mood. The patient is nervous/anxious. The patient is not hyperactive.    {Labs   Heme   Chem   Endocrine   Serology   Results Review (optional):23779}    Objective    Vitals: BP 134/72 (BP Location: Right Arm, Patient Position: Sitting, Cuff Size: Large)    Pulse 70    Temp 97.8 F (36.6 C) (Oral)    Resp 16    Ht 5\' 8"  (1.727 m)    Wt 234 lb (106.1 kg)    SpO2 100% Comment: room air   BMI 35.58 kg/m  {Show previous vital signs (optional):23777}   Physical Exam ***  Most recent functional status assessment: In your present state of health, do you have any difficulty performing the following activities: 02/13/2022  Hearing? Y  Vision? N  Difficulty concentrating or making decisions? Y  Walking or climbing stairs? Y  Dressing or bathing? N  Doing errands, shopping? N  Some recent data might be hidden   Most recent fall risk assessment: Fall Risk  02/13/2022  Falls in the past year? 0  Number falls in past yr: 0  Injury with Fall? 0  Risk for fall due to : No Fall Risks  Follow up Falls evaluation completed    Most recent depression screenings: PHQ 2/9 Scores 02/13/2022 12/29/2021  PHQ - 2 Score 4 2  PHQ- 9 Score 11 9   Most recent cognitive screening: No flowsheet data found. Most recent Audit-C alcohol use  screening Alcohol Use Disorder Test (AUDIT) 02/13/2022  1. How often do you have a drink containing alcohol? 0  2. How many drinks containing alcohol do you have on a typical day when you are drinking? 0  3. How often do you have six or more drinks on one occasion? 0  AUDIT-C Score 0  Alcohol Brief Interventions/Follow-up -   A score of 3 or more in women, and 4 or more in men indicates increased risk for alcohol abuse, EXCEPT if all of the points are from question 1   No results found for any visits on 02/13/22.  Assessment &  Plan     Annual wellness visit done today including the all of the following: Reviewed patient's Family Medical History Reviewed and updated list of patient's medical providers Assessment of cognitive impairment was done Assessed patient's functional ability Established a written schedule for health screening [place] Completed and Reviewed  Exercise Activities and Dietary recommendations  Goals      Have 3 meals a day     Recommend to decrease portion sizes by eating 3 small healthy meals and at least 2 healthy snacks per day.     Quit Smoking     Recommend to continue efforts to reduce smoking habits until no longer smoking.         Immunization History  Administered Date(s) Administered   Influenza,inj,Quad PF,6+ Mos 09/03/2020   Influenza-Unspecified 10/10/2021   PFIZER(Purple Top)SARS-COV-2 Vaccination 02/20/2020, 03/19/2020, 11/30/2020   Pneumococcal Polysaccharide-23 09/25/2016   Tdap 08/30/2017    Health Maintenance  Topic Date Due   FOOT EXAM  Never done   Zoster Vaccines- Shingrix (1 of 2) Never done   Pneumonia Vaccine 48+ Years old (2 - PCV) 09/25/2017   OPHTHALMOLOGY EXAM  01/09/2019   COVID-19 Vaccine (4 - Booster for Pfizer series) 01/25/2021   DEXA SCAN  Never done   COLONOSCOPY (Pts 45-8yrs Insurance coverage will need to be confirmed)  01/05/2022   HEMOGLOBIN A1C  01/12/2022   PAP SMEAR-Modifier   06/13/2022 (Originally 01/30/2021)   MAMMOGRAM  03/03/2023   TETANUS/TDAP  08/31/2027   INFLUENZA VACCINE  Completed   Hepatitis C Screening  Completed   HIV Screening  Completed   HPV VACCINES  Aged Out   Fecal DNA (Cologuard)  Discontinued     Discussed health benefits of physical activity, and encouraged her to engage in regular exercise appropriate for her age and condition.    ***  No follow-ups on file.     {provider attestation***:1}   Lelon Huh, MD  [facility] [phone] (phone) [phone] (fax)  [place]

## 2022-02-13 ENCOUNTER — Encounter: Payer: Self-pay | Admitting: Family Medicine

## 2022-02-13 ENCOUNTER — Other Ambulatory Visit: Payer: Self-pay

## 2022-02-13 ENCOUNTER — Ambulatory Visit (INDEPENDENT_AMBULATORY_CARE_PROVIDER_SITE_OTHER): Payer: Medicare Other | Admitting: Family Medicine

## 2022-02-13 VITALS — BP 134/72 | HR 70 | Temp 97.8°F | Resp 16 | Ht 68.0 in | Wt 234.0 lb

## 2022-02-13 DIAGNOSIS — J309 Allergic rhinitis, unspecified: Secondary | ICD-10-CM | POA: Diagnosis not present

## 2022-02-13 DIAGNOSIS — I2510 Atherosclerotic heart disease of native coronary artery without angina pectoris: Secondary | ICD-10-CM

## 2022-02-13 DIAGNOSIS — E7800 Pure hypercholesterolemia, unspecified: Secondary | ICD-10-CM

## 2022-02-13 DIAGNOSIS — F320 Major depressive disorder, single episode, mild: Secondary | ICD-10-CM

## 2022-02-13 DIAGNOSIS — E559 Vitamin D deficiency, unspecified: Secondary | ICD-10-CM

## 2022-02-13 DIAGNOSIS — Z0000 Encounter for general adult medical examination without abnormal findings: Secondary | ICD-10-CM | POA: Diagnosis not present

## 2022-02-13 DIAGNOSIS — Z23 Encounter for immunization: Secondary | ICD-10-CM | POA: Diagnosis not present

## 2022-02-13 DIAGNOSIS — M810 Age-related osteoporosis without current pathological fracture: Secondary | ICD-10-CM | POA: Diagnosis not present

## 2022-02-13 DIAGNOSIS — E119 Type 2 diabetes mellitus without complications: Secondary | ICD-10-CM | POA: Diagnosis not present

## 2022-02-13 DIAGNOSIS — E538 Deficiency of other specified B group vitamins: Secondary | ICD-10-CM | POA: Diagnosis not present

## 2022-02-13 MED ORDER — MONTELUKAST SODIUM 10 MG PO TABS: 10.0000 mg | ORAL_TABLET | Freq: Every day | ORAL | 3 refills | Status: DC

## 2022-02-13 MED ORDER — AZELASTINE HCL 0.1 % NA SOLN: 2.0000 | Freq: Two times a day (BID) | NASAL | 2 refills | Status: DC

## 2022-02-13 MED ORDER — BUPROPION HCL ER (SR) 100 MG PO TB12: 100.0000 mg | ORAL_TABLET | Freq: Two times a day (BID) | ORAL | 1 refills | Status: DC

## 2022-02-13 NOTE — Patient Instructions (Signed)
.   Please review the attached list of medications and notify my office if there are any errors.   . Please bring all of your medications to every appointment so we can make sure that our medication list is the same as yours.   

## 2022-02-14 LAB — COMPREHENSIVE METABOLIC PANEL
ALT: 17 IU/L (ref 0–32)
AST: 22 IU/L (ref 0–40)
Albumin/Globulin Ratio: 1.9 (ref 1.2–2.2)
Albumin: 4.6 g/dL (ref 3.8–4.8)
Alkaline Phosphatase: 116 IU/L (ref 44–121)
BUN/Creatinine Ratio: 21 (ref 12–28)
BUN: 15 mg/dL (ref 8–27)
Bilirubin Total: 0.6 mg/dL (ref 0.0–1.2)
CO2: 25 mmol/L (ref 20–29)
Calcium: 10.3 mg/dL (ref 8.7–10.3)
Chloride: 101 mmol/L (ref 96–106)
Creatinine, Ser: 0.71 mg/dL (ref 0.57–1.00)
Globulin, Total: 2.4 g/dL (ref 1.5–4.5)
Glucose: 99 mg/dL (ref 70–99)
Potassium: 3.6 mmol/L (ref 3.5–5.2)
Sodium: 142 mmol/L (ref 134–144)
Total Protein: 7 g/dL (ref 6.0–8.5)
eGFR: 94 mL/min/{1.73_m2} (ref >59)

## 2022-02-14 LAB — LIPID PANEL
Chol/HDL Ratio: 3.5 ratio (ref 0.0–4.4)
Cholesterol, Total: 144 mg/dL (ref 100–199)
HDL: 41 mg/dL (ref >39)
LDL Chol Calc (NIH): 76 mg/dL (ref 0–99)
Triglycerides: 153 mg/dL — ABNORMAL HIGH (ref 0–149)
VLDL Cholesterol Cal: 27 mg/dL (ref 5–40)

## 2022-02-14 LAB — VITAMIN B12: Vitamin B-12: 583 pg/mL (ref 232–1245)

## 2022-02-14 LAB — CBC
Hematocrit: 47.7 % — ABNORMAL HIGH (ref 34.0–46.6)
Hemoglobin: 15.9 g/dL (ref 11.1–15.9)
MCH: 28.1 pg (ref 26.6–33.0)
MCHC: 33.3 g/dL (ref 31.5–35.7)
MCV: 84 fL (ref 79–97)
Platelets: 237 10*3/uL (ref 150–450)
RBC: 5.65 x10E6/uL — ABNORMAL HIGH (ref 3.77–5.28)
RDW: 14 % (ref 11.7–15.4)
WBC: 9.4 10*3/uL (ref 3.4–10.8)

## 2022-02-14 LAB — VITAMIN D 25 HYDROXY (VIT D DEFICIENCY, FRACTURES): Vit D, 25-Hydroxy: 33.7 ng/mL (ref 30.0–100.0)

## 2022-02-14 LAB — HEMOGLOBIN A1C
Est. average glucose Bld gHb Est-mCnc: 120 mg/dL
Hgb A1c MFr Bld: 5.8 % — ABNORMAL HIGH (ref 4.8–5.6)

## 2022-02-15 ENCOUNTER — Inpatient Hospital Stay: Payer: Medicare Other

## 2022-02-15 ENCOUNTER — Other Ambulatory Visit: Payer: Self-pay

## 2022-02-15 ENCOUNTER — Inpatient Hospital Stay: Payer: Medicare Other | Attending: Internal Medicine | Admitting: Obstetrics and Gynecology

## 2022-02-15 VITALS — BP 143/72 | HR 72 | Temp 97.8°F | Resp 18 | Wt 231.1 lb

## 2022-02-15 DIAGNOSIS — Z7902 Long term (current) use of antithrombotics/antiplatelets: Secondary | ICD-10-CM | POA: Insufficient documentation

## 2022-02-15 DIAGNOSIS — Z90710 Acquired absence of both cervix and uterus: Secondary | ICD-10-CM | POA: Insufficient documentation

## 2022-02-15 DIAGNOSIS — Z85118 Personal history of other malignant neoplasm of bronchus and lung: Secondary | ICD-10-CM | POA: Diagnosis not present

## 2022-02-15 DIAGNOSIS — M549 Dorsalgia, unspecified: Secondary | ICD-10-CM | POA: Diagnosis not present

## 2022-02-15 DIAGNOSIS — Z6837 Body mass index (BMI) 37.0-37.9, adult: Secondary | ICD-10-CM | POA: Insufficient documentation

## 2022-02-15 DIAGNOSIS — K760 Fatty (change of) liver, not elsewhere classified: Secondary | ICD-10-CM | POA: Insufficient documentation

## 2022-02-15 DIAGNOSIS — D3911 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: Secondary | ICD-10-CM | POA: Diagnosis not present

## 2022-02-15 DIAGNOSIS — F32A Depression, unspecified: Secondary | ICD-10-CM | POA: Insufficient documentation

## 2022-02-15 DIAGNOSIS — N9489 Other specified conditions associated with female genital organs and menstrual cycle: Secondary | ICD-10-CM

## 2022-02-15 DIAGNOSIS — G894 Chronic pain syndrome: Secondary | ICD-10-CM | POA: Diagnosis not present

## 2022-02-15 DIAGNOSIS — N83299 Other ovarian cyst, unspecified side: Secondary | ICD-10-CM

## 2022-02-15 NOTE — Progress Notes (Signed)
Gynecologic Oncology Consult Visit  ? ?Referring Provider: Dr. Kenton Kingfisher ? ?Chief Complaint: Incidental Right Sided Complex Cystic Mass ?Subjective:  ?Laura Nielsen is a 66 y.o. G2P2 female who is seen in consultation from Dr. Kenton Kingfisher for incidental right sided 6 cm complex cystic mass. She was seen by Dr. Fransisca Connors in October 2021 and felt to be low risk of ovarian malignancy.  ? ?She was released to follow up with Dr. Kenton Kingfisher and last saw him in July 2022. Repeat ultrasound and ca 125 was recommended at that time.  ? ?US pelvis 01/25/22 for surveillance ?FINDINGS:  ?- UTERUS/CERVIX: Status post hysterectomy.  ?- OVARIES: The ovaries were seen well transvaginally. 5.3 x 4.2 x 5.1 cm cystic lesion with multiple mildly thickened septations noted in the right ovary, previously measured 4.8 x 4.0 x 5.3 cm. No mural nodularity, or papillary projections appreciated. Mild peripheral vascularity. Appropriate arterial inflow and venous outflow of the ovaries was documented on color and spectral Doppler imaging. The right ovary measured 5.8 x 5.6 x 5.2 cm. ?- OTHER: No abnormal pelvic free fluid. ?5.3 cm cystic lesion with thickened septations, as above, minimally increased in size compared with 05/25/2021.  ?O-RADS ultrasound category: 3: Low Risk (1  - <10%).  ? ?In 5/22, she was diagnosed with lung cancer and underwent robotic lobectomy with early stage adenocarcinoma. She has quit smoking.  ? ?Gynecologic History:  ?She underwent Low dose CT/Lung Cancer Screening in 2021 which showed volume derived mean 9.8 mm nodule in the left lower lobe, increased in size. 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  No symptoms.  ? ?In view of incidental finding of ovarian mass and [place] was done ?Ultrasound:  ?Right ovary: 6.9 x 5.2 x 5.6 normal appearing.  Complex mass with anechoic fluid and thick septations in the right ovary. No blood flow seen. Measures 6.33 x 5.16 x 4.62 cm.  ?Left ovary not visualized. No free fluid. Uterus and cervix surgically removed.  ? ?CA 125 - 8.9 ?HE4- 94.6 ?Post menopausal ROMA 1.47 = 14% (normal) ? ?Patient felt to be high risk for surgery in view of pulmonary issues and BMI and Dr. Kenton Kingfisher referred her to gynecologic oncology for evaluation.  ? ?Prior hysterectomy for bleeding 35 years ago.  ? ?Surveillance recommended in view of benign characteristics of mass.   ? ?Problem List: ?Patient Active Problem List  ? Diagnosis Date Noted  ? Secondary osteoarthritis of multiple sites 05/25/2021  ? Chronic use of opiate for therapeutic purpose 05/24/2021  ? Primary cancer of left lower lobe of lung ([place]) 05/09/2021  ? S/P robot-assisted surgical procedure 04/06/2021  ? Dysphagia 03/15/2021  ? Gastric erythema 03/15/2021  ? Complex ovarian cyst 09/22/2020  ? Adnexal mass 09/22/2020  ? Ovarian cyst, right 08/31/2020  ? 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 Fatty liver 08/09/2020  ? Recurrent major depressive disorder, in partial remission ([place]) 04/20/2020  ? Aortic atherosclerosis ([place]) 07/17/2019  ? Unspecified inflammatory spondylopathy, lumbar region ([place]) 10/04/2018  ? Abnormal MRI, lumbar spine (03/28/2017) 07/04/2017  ? Abnormal MRI, cervical spine (03/28/2017) 07/04/2017  ? Lumbar facet syndrome (Bilateral) (L>R) 04/26/2017  ? Lumbar facet hypertrophy (multilevel) (Bilateral) 04/26/2017  ? Neurogenic pain 04/26/2017  ? Musculoskeletal pain 04/26/2017  ? Chronic pain syndrome 03/08/2017  ? Chronic low back pain (  1ry area of Pain) (Bilateral) (L>R) w/o sciatica 03/08/2017  ? Chronic neck pain (2ry area of Pain) (Bilateral) (R>L) 03/08/2017  ? Cervicogenic headache (Right) 03/08/2017  ? Chronic upper back pain (3ry area of Pain) (Bilateral) (R>L) 03/08/2017  ? Chronic hip pain (Bilateral) (L>R) 03/08/2017  ?  Osteoarthritis of hip (Bilateral) (L>R) 03/08/2017  ? Cervical central spinal stenosis 03/08/2017  ? Cervical spondylosis with radiculopathy (Right) (C5) 03/08/2017  ? Lumbar spondylosis 03/08/2017  ? Atherosclerosis of native arteries of extremity with intermittent claudication ([place]) 02/22/2017  ? Gout 02/16/2016  ? Arthritis 05/14/2015  ? Carotid artery narrowing 05/14/2015  ? Claudication ([place]) 05/14/2015  ? CAFL (chronic airflow limitation) ([place]) 05/14/2015  ? Clinical depression 05/14/2015  ? Emphysema lung ([place]) 05/14/2015  ? Acid reflux 05/14/2015  ? Urinary incontinence 05/14/2015  ? Pins and needles sensation 05/14/2015  ? Obstructive apnea 05/14/2015  ? Severe obesity (BMI 35.0-39.9) with comorbidity ([place]) 05/14/2015  ? History of abnormal cervical Papanicolaou smear 05/14/2015  ? Peripheral blood vessel disorder ([place]) 05/14/2015  ? B12 deficiency 05/14/2015  ? Vitamin D insufficiency 05/14/2015  ? S/P AAA repair 05/26/2014  ? Aortic heart valve narrowing 01/31/2013  ? Malaise and fatigue 09/26/2012  ? CAD in native artery 03/17/2009  ? Diabetes mellitus, type 2 ([place]) 02/05/2009  ? Hypercholesteremia 06/24/2008  ? Allergic rhinitis 10/25/2007  ? Airway hyperreactivity 10/25/2007  ? Smoking greater than 30 pack years 10/25/2007  ? Narrowing of intervertebral disc space 10/25/2007  ? Primary hypertension 10/25/2007  ? Arthritis, degenerative 10/25/2007  ? ? ?Past Medical History: ?Past Medical History:  ?Diagnosis Date  ? AAA (abdominal aortic aneurysm)   ? s/p EVAR AAA 03/27/13  ? Arthritis   ? Asthma   ? Complication of anesthesia   ? BP drops after  ? History of kidney stones   ? Osteoporosis   ? Sleep apnea   ? ? ?Past Surgical History: ?Past Surgical History:  ?Procedure Laterality Date  ? ABDOMINAL AORTIC ENDOVASCULAR STENT GRAFT  03/27/2013  ? Dr. Hortencia Pilar  ? Cardiac catheterization  02/2009  ? 70-80% stenosis RCA stent placed. started on Plavix  ? CARDIAC CATHETERIZATION    ? COLONOSCOPY WITH  PROPOFOL N/A 01/05/2021  ? Procedure: COLONOSCOPY WITH PROPOFOL;  Surgeon: Virgel Manifold, MD;  Location: [facility] END[facility];  Service: Endoscopy;  Laterality: N/A;  ? CORONARY ANGIOPLASTY    ? stent placed  ? ESOPHAGOGASTRODUODENOSCOPY (EGD) WITH PROPOFOL N/A 01/05/2021  ? Procedure: ESOPHAGOGASTRODUODENOSCOPY (EGD) WITH PROPOFOL;  Surgeon: Virgel Manifold, MD;  Location: [facility];  Service: Endoscopy;  Laterality: N/A;  ? INTERCOSTAL NERVE BLOCK Left 04/06/2021  ? Procedure: INTERCOSTAL NERVE BLOCK;  Surgeon: Melrose Nakayama, MD;  Location: [place];  Service: Thoracic;  Laterality: Left;  ? [place]  ? LUNG LOBECTOMY Left 04/06/2021  ? robotic left lower lobectomy 04/06/2021 Dr. Roxan Hockey for Stage 1A adenocarcinoma  ? NODE DISSECTION Left 04/06/2021  ? Procedure: NODE DISSECTION;  Surgeon: Melrose Nakayama, MD;  Location: [place];  Service: Thoracic;  Laterality: Left;  ? TUBAL LIGATION    ? VAGINAL HYSTERECTOMY    ? Menometrorrhagia. Excessive bleeding. Unknown if cervix removed.   ?  ? ?OB History:  ?OB History  ?Gravida Para Term Preterm AB Living  ?2 2          ?SAB IAB Ectopic Multiple Live Births  ?           ?  ?# Outcome Date GA  Lbr Len/2nd Weight Sex Delivery Anes PTL Lv  ?2 Para           ?1 Para           ? ? ?Family History: ?Family History  ?Problem Relation Age of Onset  ? Hypertension Mother   ? Coronary artery disease Mother   ? Heart attack Mother   ?     acute  ? Cancer Mother   ? Alcohol abuse Father   ? Depression Father   ? Hypertension Father   ? Heart attack Father 66  ?     acute  ? Alcohol abuse Sister   ? Hyperlipidemia Sister   ? Hypertension Sister   ? Cancer Sister 42  ? Heart attack Sister   ?     x's 2  ? Coronary artery disease Sister 40  ?     x's 2  ? Breast cancer Sister 68  ? ? ?Social History: ?Social History  ? ?Socioeconomic History  ? Marital status: Divorced  ?  Spouse name: Not on file  ? Number of children: 2  ? Years of education:  H/S  ? Highest education level: High school graduate  ?Occupational History  ? Occupation: Disabled  ? Occupation: retired  ?Tobacco Use  ? Smoking status: Former  ?  Packs/day: 0.25  ?  Years: 44.50  ?  Pack years:

## 2022-02-15 NOTE — Progress Notes (Deleted)
Gynecologic Oncology Consult Visit  ? ?Referring Provider: ?*** ? ?Chief Complaint: *** ? ?Subjective:  ?Laura Nielsen is a 66 y.o. female who is seen in consultation from Dr. Kenton Kingfisher for ***. ? ?She previously had incidental right adnexal cystic lesion measuring 5.8 cm, no FDG uptake, and was seen by Dr. Fransisca Connors in October 2021.  Ultrasound at that time described right ovary as 6.9 x 5.2 x 5.6, normal-appearing, complex mass with anechoic fluid and thick septations.  No blood flow.  Mass measuring 6.3 x 5.1 x 4.6 cm.  Left ovary was not visualized.  She has a history of vaginal hysterectomy for menometrorrhagia.  ? ?CA125 was 8.9 ?HD4 was 94.6 ?Postmenopausal [place] 1.47/14% (normal). ? ?She was felt to be high risk for surgery and was referred to [facility] for evaluation. ? ?Problem List: ?Patient Active Problem List  ? Diagnosis Date Noted  ? Secondary osteoarthritis of multiple sites 05/25/2021  ? Chronic use of opiate for therapeutic purpose 05/24/2021  ? Primary cancer of left lower lobe of lung ([place]) 05/09/2021  ? S/P robot-assisted surgical procedure 04/06/2021  ? Dysphagia 03/15/2021  ? Gastric erythema 03/15/2021  ? Complex ovarian cyst 09/22/2020  ? Adnexal mass 09/22/2020  ? Ovarian cyst, right 08/31/2020  ? 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 Fatty liver 08/09/2020  ? Recurrent major depressive disorder, in partial remission ([place]) 04/20/2020  ? Aortic atherosclerosis ([place]) 07/17/2019  ? Unspecified inflammatory spondylopathy, lumbar region ([place]) 10/04/2018  ? Abnormal MRI, lumbar spine (03/28/2017) 07/04/2017  ? Abnormal MRI, cervical spine (03/28/2017) 07/04/2017  ? Lumbar facet syndrome (Bilateral) (L>R) 04/26/2017  ? Lumbar facet hypertrophy (multilevel) (Bilateral) 04/26/2017  ? Neurogenic pain 04/26/2017  ? Musculoskeletal pain 04/26/2017  ? Chronic pain syndrome 03/08/2017  ? Chronic low back pain (1ry area of Pain) (Bilateral) (L>R) w/o sciatica 03/08/2017  ? Chronic neck  pain (2ry area of Pain) (Bilateral) (R>L) 03/08/2017  ? Cervicogenic headache (Right) 03/08/2017  ? Chronic upper back pain (3ry area of Pain) (Bilateral) (R>L) 03/08/2017  ? Chronic hip pain (Bilateral) (L>R) 03/08/2017  ? Osteoarthritis of hip (Bilateral) (L>R) 03/08/2017  ? Cervical central spinal stenosis 03/08/2017  ? Cervical spondylosis with radiculopathy (Right) (C5) 03/08/2017  ? Lumbar spondylosis 03/08/2017  ? Atherosclerosis of native arteries of extremity with intermittent claudication ([place]) 02/22/2017  ? Gout 02/16/2016  ? Arthritis 05/14/2015  ? Carotid artery narrowing 05/14/2015  ? Claudication ([place]) 05/14/2015  ? CAFL (chronic airflow limitation) ([place]) 05/14/2015  ? Clinical depression 05/14/2015  ? Emphysema lung ([place]) 05/14/2015  ? Acid reflux 05/14/2015  ? Urinary incontinence 05/14/2015  ? Pins and needles sensation 05/14/2015  ? Obstructive apnea 05/14/2015  ? Severe obesity (BMI 35.0-39.9) with comorbidity ([place]) 05/14/2015  ? History of abnormal cervical Papanicolaou smear 05/14/2015  ? Peripheral blood vessel disorder ([place]) 05/14/2015  ? B12 deficiency 05/14/2015  ? Vitamin D insufficiency 05/14/2015  ? S/P AAA repair 05/26/2014  ? Aortic heart valve narrowing 01/31/2013  ? Malaise and fatigue 09/26/2012  ? CAD in native artery 03/17/2009  ? Diabetes mellitus, type 2 ([place]) 02/05/2009  ? Hypercholesteremia 06/24/2008  ? Allergic rhinitis 10/25/2007  ? Airway hyperreactivity 10/25/2007  ? Smoking greater than 30 pack years 10/25/2007  ? Narrowing of intervertebral disc space 10/25/2007  ? Primary hypertension 10/25/2007  ? Arthritis, degenerative 10/25/2007  ? ? ?Past Medical History: ?Past Medical History:  ?Diagnosis Date  ? AAA (abdominal aortic aneurysm)   ? s/p EVAR AAA 03/27/13  ?  Arthritis   ? Asthma   ? Complication of anesthesia   ? BP drops after  ? History of kidney stones   ? Osteoporosis   ? Sleep apnea   ? ? ?Past Surgical History: ?Past Surgical History:  ?Procedure Laterality  Date  ? ABDOMINAL AORTIC ENDOVASCULAR STENT GRAFT  03/27/2013  ? Dr. Hortencia Pilar  ? Cardiac catheterization  02/2009  ? 70-80% stenosis RCA stent placed. started on Plavix  ? CARDIAC CATHETERIZATION    ? COLONOSCOPY WITH PROPOFOL N/A 01/05/2021  ? Procedure: COLONOSCOPY WITH PROPOFOL;  Surgeon: Virgel Manifold, MD;  Location: [facility];  Service: Endoscopy;  Laterality: N/A;  ? CORONARY ANGIOPLASTY    ? stent placed  ? ESOPHAGOGASTRODUODENOSCOPY (EGD) WITH PROPOFOL N/A 01/05/2021  ? Procedure: ESOPHAGOGASTRODUODENOSCOPY (EGD) WITH PROPOFOL;  Surgeon: Virgel Manifold, MD;  Location: [facility];  Service: Endoscopy;  Laterality: N/A;  ? INTERCOSTAL NERVE BLOCK Left 04/06/2021  ? Procedure: INTERCOSTAL NERVE BLOCK;  Surgeon: Melrose Nakayama, MD;  Location: [place];  Service: Thoracic;  Laterality: Left;  ? [place]  ? LUNG LOBECTOMY Left 04/06/2021  ? robotic left lower lobectomy 04/06/2021 Dr. Roxan Hockey for Stage 1A adenocarcinoma  ? NODE DISSECTION Left 04/06/2021  ? Procedure: NODE DISSECTION;  Surgeon: Melrose Nakayama, MD;  Location: [place];  Service: Thoracic;  Laterality: Left;  ? TUBAL LIGATION    ? VAGINAL HYSTERECTOMY    ? Menometrorrhagia. Excessive bleeding. Unknown if cervix removed.   ? ? ?Past Gynecologic History:  ?Menarche: {NUMBERS 0-12:18577} ?Menstrual details: Lasts {NUMBERS 0-12:18577} days, {DESC; menstrual flow:21146} ?Menses regular: {yes no:20984} ?Last Menstrual Period: *** ?History of OCP/HRT use: *** ?History of Abnormal pap: {yes no:20984}, {PAP RESULT:21077} ?Last pap: {Blank multiple:19196} ?History of STDs: {STD history:20597} ?Contraception: {contraceptive methods:21883} ?Sexually active: {yes no unk:32069} ? ?OB History:  ?OB History  ?Gravida Para Term Preterm AB Living  ?2 2          ?SAB IAB Ectopic Multiple Live Births  ?           ?  ?# Outcome Date GA Lbr Len/2nd Weight Sex Delivery Anes PTL Lv  ?2 Para           ?1 Para            ? ? ?Family History: ?Family History  ?Problem Relation Age of Onset  ? Hypertension Mother   ? Coronary artery disease Mother   ? Heart attack Mother   ?     acute  ? Cancer Mother   ? Alcohol abuse Father   ? Depression Father   ? Hypertension Father   ? Heart attack Father 15  ?     acute  ? Alcohol abuse Sister   ? Hyperlipidemia Sister   ? Hypertension Sister   ? Cancer Sister 29  ? Heart attack Sister   ?     x's 2  ? Coronary artery disease Sister 54  ?     x's 2  ? Breast cancer Sister 52  ? ? ?Social History: ?Social History  ? ?Socioeconomic History  ? Marital status: Divorced  ?  Spouse name: Not on file  ? Number of children: 2  ? Years of education: H/S  ? Highest education level: High school graduate  ?Occupational History  ? Occupation: Disabled  ? Occupation: retired  ?Tobacco Use  ? Smoking status: Former  ?  Packs/day: 0.25  ?  Years: 44.50  ?  Pack years: 11.13  ?  Types: Cigarettes  ?  Quit date: 04/05/2021  ?  Years since quitting: 0.8  ? Smokeless tobacco: Never  ? Tobacco comments:  ?  12/23/19 states she quit for 3 months and then started back. Pt is going to talk with MD about this.  ?Vaping Use  ? Vaping Use: Never used  ?Substance and Sexual Activity  ? Alcohol use: Yes  ?  Comment: rarely - once a year  ? Drug use: No  ? Sexual activity: Not on file  ?Other Topics Concern  ? Not on file  ?Social History Narrative  ? Hx of smoking; quit prior to lung surgery. Lives in [place]- self. No alcohol. 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 On disability sec to spinal pain.   ? ?Social Determinants of Health  ? ?Financial Resource Strain: Not on file  ?Food Insecurity: Not on file  ?Transportation Needs: Not on file  ?Physical Activity: Not on file  ?Stress: Not on file  ?Social Connections: Not on file  ?Intimate Partner Violence: Not on file  ? ? ?Allergies: ?Allergies  ?Allergen Reactions  ? Atorvastatin Other (See Comments)  ?  Elevated blood sugar ?  ? Omeprazole Nausea And Vomiting  ?  Bupropion Nausea Only  ?  Only on the 150mg  tablets, but the 100mg  didn't help with smoking  ? ? ?Current Medications: ?Current Outpatient Medications  ?Medication Sig Dispense Refill  ? allopurinol (ZYLOPRIM)

## 2022-02-16 LAB — CA 125: Cancer Antigen (CA) 125: 33.3 U/mL (ref 0.0–38.1)

## 2022-02-16 LAB — HUMAN EPIDIDYMIS PROT 4,SERIAL: HE4: 104 pmol/L — ABNORMAL HIGH (ref 0.0–96.5)

## 2022-02-21 ENCOUNTER — Ambulatory Visit: Payer: Medicare Other | Admitting: Obstetrics & Gynecology

## 2022-03-03 ENCOUNTER — Ambulatory Visit
Admission: RE | Admit: 2022-03-03 | Discharge: 2022-03-03 | Disposition: A | Discharge status: Home / Self Care | Payer: Medicare Other | Source: Ambulatory Visit | Attending: Family Medicine | Admitting: Family Medicine

## 2022-03-03 DIAGNOSIS — Z1231 Encounter for screening mammogram for malignant neoplasm of breast: Secondary | ICD-10-CM | POA: Insufficient documentation

## 2022-03-10 ENCOUNTER — Telehealth: Payer: Self-pay | Admitting: *Deleted

## 2022-03-10 NOTE — Chronic Care Management (AMB) (Signed)
?  Care Management  ? ?Note ? ?03/10/2022 ?Name: Laura Nielsen MRN: [id number] DOB: 1956/11/10 ? ?Laura Nielsen is a 66 y.o. year old female who is a primary care patient of Caryn Section, Kirstie Peri, MD. I reached out to Laura Nielsen by phone today offer care coordination services.  ? ?Ms. Eakes was given information about care management services today including:  ?Care management services include personalized support from designated clinical staff supervised by her physician, including individualized plan of care and coordination with other care providers ?24/7 contact phone numbers for assistance for urgent and routine care needs. ?The patient may stop care management services at any time by phone call to the office staff. ? ?Patient agreed to services and verbal consent obtained.  ? ?Follow up plan: ?Telephone appointment with care management team member scheduled for:03/21/22 ? ?Laverda Sorenson  ?Care Guide, Embedded Care Coordination ?[place]  Care Management  ?Direct Dial: [phone] ? ?

## 2022-03-17 DIAGNOSIS — R059 Cough, unspecified: Secondary | ICD-10-CM | POA: Diagnosis not present

## 2022-03-17 DIAGNOSIS — R051 Acute cough: Secondary | ICD-10-CM | POA: Diagnosis not present

## 2022-03-17 DIAGNOSIS — Z20822 Contact with and (suspected) exposure to covid-19: Secondary | ICD-10-CM | POA: Diagnosis not present

## 2022-03-20 DIAGNOSIS — Z20822 Contact with and (suspected) exposure to covid-19: Secondary | ICD-10-CM | POA: Diagnosis not present

## 2022-03-21 ENCOUNTER — Ambulatory Visit: Payer: Medicare Other

## 2022-03-21 DIAGNOSIS — E119 Type 2 diabetes mellitus without complications: Secondary | ICD-10-CM

## 2022-03-21 DIAGNOSIS — J449 Chronic obstructive pulmonary disease, unspecified: Secondary | ICD-10-CM

## 2022-03-21 DIAGNOSIS — E7800 Pure hypercholesterolemia, unspecified: Secondary | ICD-10-CM

## 2022-03-21 NOTE — Chronic Care Management (AMB) (Signed)
? Care Management ?  ? RN Visit Note ? ?03/21/2022 ?Name: Laura Nielsen MRN: [id number] DOB: October 21, 1956 ? ?Subjective: ?Laura Nielsen is a 66 y.o. year old female who is a primary care patient of Caryn Section, Kirstie Peri, MD. The care management team was consulted for assistance with disease management and care coordination needs.   ? ?Engaged with patient by telephone for initial visit in response to provider referral for case management and care coordination services.  ? ?Consent to Services:  ? Ms. Custis was given information about Care Management services including:  ?Care Management services includes personalized support from designated clinical staff supervised by her physician, including individualized plan of care and coordination with other care providers ?24/7 contact phone numbers for assistance for urgent and routine care needs. ?The patient may stop case management services at any time by phone call to the office staff. ? ?Patient agreed to services and consent obtained.  ? ?Assessment: Review of patient past medical history, allergies, medications, health status, including review of consultants reports, laboratory and other test data, was performed as part of comprehensive evaluation and provision of chronic care management services.  ? ?SDOH (Social Determinants of Health) assessments and interventions performed:  ?SDOH Interventions   ? ?Flowsheet Row Most Recent Value  ?SDOH Interventions   ?Food Insecurity Interventions Intervention Not Indicated  ?Transportation Interventions Intervention Not Indicated  ? ?  ?  ? ?Care Plan ? ?Allergies  ?Allergen Reactions  ? Atorvastatin Other (See Comments)  ?  Elevated blood sugar ?  ? Omeprazole Nausea And Vomiting  ? Bupropion Nausea Only  ?  Only on the 150mg  tablets, but the 100mg  didn't help with smoking  ? ? ?Outpatient Encounter Medications as of 03/21/2022  ?Medication Sig  ? allopurinol (ZYLOPRIM) 100 MG tablet Take 1 tablet (100 mg total) by mouth daily.  ? aspirin  81 MG tablet Take 81 mg by mouth daily.   ? azelastine (ASTELIN) 0.1 % nasal spray Place 2 sprays into both nostrils 2 (two) times daily. Use in each nostril as directed  ? bisoprolol (ZEBETA) 10 MG tablet Take 1 tablet (10 mg total) by mouth daily.  ? buPROPion ER (WELLBUTRIN SR) 100 MG 12 hr tablet Take 1 tablet (100 mg total) by mouth 2 (two) times daily.  ? Cholecalciferol 25 MCG (1000 UT) capsule Take 1,000 Units by mouth daily.  ? clopidogrel (PLAVIX) 75 MG tablet Take 1 tablet (75 mg total) by mouth daily.  ? cyanocobalamin 1000 MCG tablet Take 1,000 mcg by mouth daily.  ? gabapentin (NEURONTIN) 300 MG capsule Take 1 capsule (300 mg total) by mouth 3 (three) times daily AND 2 capsules (600 mg total) at bedtime.  ? lisinopril-hydrochlorothiazide (ZESTORETIC) 20-12.5 MG tablet Take 1 tablet by mouth daily.  ? montelukast (SINGULAIR) 10 MG tablet Take 1 tablet (10 mg total) by mouth at bedtime.  ? OZEMPIC, 1 MG/DOSE, 4 MG/3ML SOPN Inject 1 mg into the skin once a week.  ? pantoprazole (PROTONIX) 20 MG tablet Take 1 tablet (20 mg total) by mouth daily.  ? PROAIR HFA 108 (90 Base) MCG/ACT inhaler Inhale 2 puffs into the lungs every 6 (six) hours as needed for wheezing or shortness of breath.  ? rosuvastatin (CRESTOR) 20 MG tablet Take 1 tablet (20 mg total) by mouth daily.  ? fluticasone (FLONASE) 50 MCG/ACT nasal spray Place into both nostrils daily. (Patient not taking: Reported on 02/15/2022)  ? ibuprofen (ADVIL) 200 MG tablet Take 400 mg by mouth 2 (two)  times daily as needed (headaches).  ? ?No facility-administered encounter medications on file as of 03/21/2022.  ? ? ?Patient Active Problem List  ? Diagnosis Date Noted  ? Secondary osteoarthritis of multiple sites 05/25/2021  ? Chronic use of opiate for therapeutic purpose 05/24/2021  ? Primary cancer of left lower lobe of lung ([place]) 05/09/2021  ? S/P robot-assisted surgical procedure 04/06/2021  ? Dysphagia 03/15/2021  ? Gastric erythema 03/15/2021  ? Complex  ovarian cyst 09/22/2020  ? Adnexal mass 09/22/2020  ? Ovarian cyst, right 08/31/2020  ? 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 Fatty liver 08/09/2020  ? Recurrent major depressive disorder, in partial remission ([place]) 04/20/2020  ? Aortic atherosclerosis ([place]) 07/17/2019  ? Unspecified inflammatory spondylopathy, lumbar region ([place]) 10/04/2018  ? Abnormal MRI, lumbar spine (03/28/2017) 07/04/2017  ? Abnormal MRI, cervical spine (03/28/2017) 07/04/2017  ? Lumbar facet syndrome (Bilateral) (L>R) 04/26/2017  ? Lumbar facet hypertrophy (multilevel) (Bilateral) 04/26/2017  ? Neurogenic pain 04/26/2017  ? Musculoskeletal pain 04/26/2017  ? Chronic pain syndrome 03/08/2017  ? Chronic low back pain (1ry area of Pain) (Bilateral) (L>R) w/o sciatica 03/08/2017  ? Chronic neck pain (2ry area of Pain) (Bilateral) (R>L) 03/08/2017  ? Cervicogenic headache (Right) 03/08/2017  ? Chronic upper back pain (3ry area of Pain) (Bilateral) (R>L) 03/08/2017  ? Chronic hip pain (Bilateral) (L>R) 03/08/2017  ? Osteoarthritis of hip (Bilateral) (L>R) 03/08/2017  ? Cervical central spinal stenosis 03/08/2017  ? Cervical spondylosis with radiculopathy (Right) (C5) 03/08/2017  ? Lumbar spondylosis 03/08/2017  ? Atherosclerosis of native arteries of extremity with intermittent claudication ([place]) 02/22/2017  ? Gout 02/16/2016  ? Arthritis 05/14/2015  ? Carotid artery narrowing 05/14/2015  ? Claudication ([place]) 05/14/2015  ? CAFL (chronic airflow limitation) ([place]) 05/14/2015  ? Clinical depression 05/14/2015  ? Emphysema lung ([place]) 05/14/2015  ? Acid reflux 05/14/2015  ? Urinary incontinence 05/14/2015  ? Pins and needles sensation 05/14/2015  ? Obstructive apnea 05/14/2015  ? Severe obesity (BMI 35.0-39.9) with comorbidity ([place]) 05/14/2015  ? History of abnormal cervical Papanicolaou smear 05/14/2015  ? Peripheral blood vessel disorder ([place]) 05/14/2015  ? B12 deficiency 05/14/2015  ? Vitamin D insufficiency 05/14/2015  ? S/P  AAA repair 05/26/2014  ? Aortic heart valve narrowing 01/31/2013  ? Malaise and fatigue 09/26/2012  ? CAD in native artery 03/17/2009  ? Diabetes mellitus, type 2 ([place]) 02/05/2009  ? Hypercholesteremia 06/24/2008  ? Allergic rhinitis 10/25/2007  ? Airway hyperreactivity 10/25/2007  ? Smoking greater than 30 pack years 10/25/2007  ? Narrowing of intervertebral disc space 10/25/2007  ? Primary hypertension 10/25/2007  ? Arthritis, degenerative 10/25/2007  ? ? ? ? ?PLAN ?Ms. Bistline will call or contact the clinic if she requires additional outreach. A member of the care management team will gladly assist. ? ? ?Glendale Wherry,RN ?[facility] ?[place] ?[phone] ? ? ? ? ? ? ? ? ? ?

## 2022-04-05 ENCOUNTER — Ambulatory Visit
Admission: RE | Admit: 2022-04-05 | Discharge: 2022-04-05 | Disposition: A | Discharge status: Home / Self Care | Payer: Medicare Other | Source: Ambulatory Visit | Attending: Family Medicine | Admitting: Family Medicine

## 2022-04-05 DIAGNOSIS — M810 Age-related osteoporosis without current pathological fracture: Secondary | ICD-10-CM | POA: Insufficient documentation

## 2022-04-05 DIAGNOSIS — Z780 Asymptomatic menopausal state: Secondary | ICD-10-CM | POA: Diagnosis not present

## 2022-04-12 ENCOUNTER — Other Ambulatory Visit: Payer: Self-pay | Admitting: Family Medicine

## 2022-04-12 DIAGNOSIS — I10 Essential (primary) hypertension: Secondary | ICD-10-CM

## 2022-05-08 ENCOUNTER — Ambulatory Visit
Admission: RE | Admit: 2022-05-08 | Discharge: 2022-05-08 | Disposition: A | Discharge status: Home / Self Care | Payer: Medicare Other | Source: Ambulatory Visit | Attending: Internal Medicine | Admitting: Internal Medicine

## 2022-05-08 DIAGNOSIS — C3432 Malignant neoplasm of lower lobe, left bronchus or lung: Secondary | ICD-10-CM | POA: Diagnosis not present

## 2022-05-08 DIAGNOSIS — R918 Other nonspecific abnormal finding of lung field: Secondary | ICD-10-CM | POA: Diagnosis not present

## 2022-05-08 DIAGNOSIS — J439 Emphysema, unspecified: Secondary | ICD-10-CM | POA: Diagnosis not present

## 2022-05-08 DIAGNOSIS — C3490 Malignant neoplasm of unspecified part of unspecified bronchus or lung: Secondary | ICD-10-CM | POA: Diagnosis not present

## 2022-05-08 LAB — POCT I-STAT CREATININE: Creatinine, Ser: 0.7 mg/dL (ref 0.44–1.00)

## 2022-05-08 MED ORDER — IOHEXOL 300 MG/ML  SOLN
75.0000 mL | Freq: Once | INTRAMUSCULAR | Status: AC | PRN
  Administered 2022-05-08: 75 mL via INTRAVENOUS

## 2022-05-10 ENCOUNTER — Inpatient Hospital Stay (HOSPITAL_BASED_OUTPATIENT_CLINIC_OR_DEPARTMENT_OTHER): Payer: Medicare Other | Admitting: Internal Medicine

## 2022-05-10 ENCOUNTER — Inpatient Hospital Stay: Payer: Medicare Other | Attending: Internal Medicine

## 2022-05-10 ENCOUNTER — Encounter: Payer: Self-pay | Admitting: Internal Medicine

## 2022-05-10 VITALS — BP 129/80 | HR 69 | Temp 97.2°F | Ht 68.0 in | Wt 228.8 lb

## 2022-05-10 DIAGNOSIS — Z902 Acquired absence of lung [part of]: Secondary | ICD-10-CM | POA: Diagnosis not present

## 2022-05-10 DIAGNOSIS — Z79899 Other long term (current) drug therapy: Secondary | ICD-10-CM | POA: Insufficient documentation

## 2022-05-10 DIAGNOSIS — F17200 Nicotine dependence, unspecified, uncomplicated: Secondary | ICD-10-CM | POA: Insufficient documentation

## 2022-05-10 DIAGNOSIS — C3432 Malignant neoplasm of lower lobe, left bronchus or lung: Secondary | ICD-10-CM | POA: Diagnosis not present

## 2022-05-10 DIAGNOSIS — R918 Other nonspecific abnormal finding of lung field: Secondary | ICD-10-CM | POA: Insufficient documentation

## 2022-05-10 DIAGNOSIS — J449 Chronic obstructive pulmonary disease, unspecified: Secondary | ICD-10-CM | POA: Diagnosis not present

## 2022-05-10 DIAGNOSIS — N839 Noninflammatory disorder of ovary, fallopian tube and broad ligament, unspecified: Secondary | ICD-10-CM | POA: Insufficient documentation

## 2022-05-10 DIAGNOSIS — R0789 Other chest pain: Secondary | ICD-10-CM | POA: Diagnosis not present

## 2022-05-10 DIAGNOSIS — E279 Disorder of adrenal gland, unspecified: Secondary | ICD-10-CM | POA: Insufficient documentation

## 2022-05-10 DIAGNOSIS — C3490 Malignant neoplasm of unspecified part of unspecified bronchus or lung: Secondary | ICD-10-CM

## 2022-05-10 LAB — COMPREHENSIVE METABOLIC PANEL
ALT: 16 U/L (ref 0–44)
AST: 21 U/L (ref 15–41)
Albumin: 3.9 g/dL (ref 3.5–5.0)
Alkaline Phosphatase: 104 U/L (ref 38–126)
Anion gap: 7 (ref 5–15)
BUN: 14 mg/dL (ref 8–23)
CO2: 27 mmol/L (ref 22–32)
Calcium: 9.2 mg/dL (ref 8.9–10.3)
Chloride: 102 mmol/L (ref 98–111)
Creatinine, Ser: 0.66 mg/dL (ref 0.44–1.00)
GFR, Estimated: >60 mL/min (ref >60)
Glucose, Bld: 104 mg/dL — ABNORMAL HIGH (ref 70–99)
Potassium: 3.4 mmol/L — ABNORMAL LOW (ref 3.5–5.1)
Sodium: 136 mmol/L (ref 135–145)
Total Bilirubin: 0.5 mg/dL (ref 0.3–1.2)
Total Protein: 7.4 g/dL (ref 6.5–8.1)

## 2022-05-10 LAB — CBC WITH DIFFERENTIAL/PLATELET
Abs Immature Granulocytes: 0.03 10*3/uL (ref 0.00–0.07)
Basophils Absolute: 0.1 10*3/uL (ref 0.0–0.1)
Basophils Relative: 1 %
Eosinophils Absolute: 0.4 10*3/uL (ref 0.0–0.5)
Eosinophils Relative: 4 %
HCT: 46.2 % — ABNORMAL HIGH (ref 36.0–46.0)
Hemoglobin: 15.4 g/dL — ABNORMAL HIGH (ref 12.0–15.0)
Immature Granulocytes: 0 %
Lymphocytes Relative: 37 %
Lymphs Abs: 3.7 10*3/uL (ref 0.7–4.0)
MCH: 28.2 pg (ref 26.0–34.0)
MCHC: 33.3 g/dL (ref 30.0–36.0)
MCV: 84.5 fL (ref 80.0–100.0)
Monocytes Absolute: 0.6 10*3/uL (ref 0.1–1.0)
Monocytes Relative: 6 %
Neutro Abs: 5.2 10*3/uL (ref 1.7–7.7)
Neutrophils Relative %: 52 %
Platelets: 230 10*3/uL (ref 150–400)
RBC: 5.47 MIL/uL — ABNORMAL HIGH (ref 3.87–5.11)
RDW: 13.8 % (ref 11.5–15.5)
WBC: 10.1 10*3/uL (ref 4.0–10.5)
nRBC: 0 % (ref 0.0–0.2)

## 2022-05-10 NOTE — Progress Notes (Signed)
[name] NOTE  Patient Care Team: Birdie Sons, MD as PCP - General (Family Medicine) Ubaldo Glassing Javier Docker, MD as Consulting Physician (Cardiology) Schnier, Dolores Lory, MD (Vascular Surgery) Vladimir Crofts, MD as Consulting Physician (Neurology) Milinda Pointer, MD as Referring Physician (Pain Medicine) Pa, [place] (Optometry) Ubaldo Glassing, Javier Docker, MD as Consulting Physician (Cardiology) Virgel Manifold, MD (Inactive) as Consulting Physician (Gastroenterology) Gae Dry, MD (Inactive) as Referring Physician (Obstetrics and Gynecology) Telford Nab, RN as Oncology Nurse Navigator Neldon Labella, RN as Case Manager   CHIEF COMPLAINTS/PURPOSE OF CONSULTATION: lung cancer  #  Oncology History Overview Note  #MAY 2022- LLL nodule 2.3 cm Adenocarcinoma Stage IA; s/p lobectomy.  No adjuvant therapy  #June 2023-CT scan-left adrenal mass concerning for metastases; stable to slightly increased right lower lobe lung nodules approximately 1 cm in size   Primary cancer of left lower lobe of lung ([place])  05/09/2021 Initial Diagnosis   Primary cancer of left lower lobe of lung ([place])      HISTORY OF PRESENTING ILLNESS: Alone.  Ambulating independently.  Laura Nielsen 66 y.o.  female history of smoking-active; is here today with results of her surveillance CT scan.  Patient continues to have mild shortness of breath on exertion.  Denies any headaches.  Denies any nausea vomiting.  Patient does complain of left lower lobe chest wall pain.  Review of Systems  Constitutional:  Negative for chills, diaphoresis, fever, malaise/fatigue and weight loss.  HENT:  Negative for nosebleeds and sore throat.   Eyes:  Negative for double vision.  Respiratory:  Negative for cough, hemoptysis, sputum production, shortness of breath and wheezing.   Cardiovascular:  Positive for chest pain. Negative for palpitations, orthopnea and leg swelling.  Gastrointestinal:   Negative for abdominal pain, blood in stool, constipation, diarrhea, heartburn, melena, nausea and vomiting.  Genitourinary:  Negative for dysuria, frequency and urgency.  Musculoskeletal:  Positive for back pain and joint pain.  Skin: Negative.  Negative for itching and rash.  Neurological:  Positive for tingling. Negative for dizziness, focal weakness, weakness and headaches.  Endo/Heme/Allergies:  Does not bruise/bleed easily.  Psychiatric/Behavioral:  Negative for depression. The patient is not nervous/anxious and does not have insomnia.     MEDICAL HISTORY:  Past Medical History:  Diagnosis Date   AAA (abdominal aortic aneurysm) ([place])    s/p EVAR AAA 03/27/13   Arthritis    Asthma    Complication of anesthesia    BP drops after   History of kidney stones    Osteoporosis    Sleep apnea     SURGICAL HISTORY: Past Surgical History:  Procedure Laterality Date   ABDOMINAL AORTIC ENDOVASCULAR STENT GRAFT  03/27/2013   Dr. Hortencia Pilar   Cardiac catheterization  02/2009   70-80% stenosis RCA stent placed. started on Plavix   CARDIAC CATHETERIZATION     COLONOSCOPY WITH PROPOFOL N/A 01/05/2021   Procedure: COLONOSCOPY WITH PROPOFOL;  Surgeon: Virgel Manifold, MD;  Location: [facility] ENDOSCOPY;  Service: Endoscopy;  Laterality: N/A;   CORONARY ANGIOPLASTY     stent placed   ESOPHAGOGASTRODUODENOSCOPY (EGD) WITH PROPOFOL N/A 01/05/2021   Procedure: ESOPHAGOGASTRODUODENOSCOPY (EGD) WITH PROPOFOL;  Surgeon: Virgel Manifold, MD;  Location: [facility] ENDOSCOPY;  Service: Endoscopy;  Laterality: N/A;   INTERCOSTAL NERVE BLOCK Left 04/06/2021   Procedure: INTERCOSTAL NERVE BLOCK;  Surgeon: Melrose Nakayama, MD;  Location: [place];  Service: Thoracic;  Laterality: Left;   KIDNEY STONE SURGERY  1999   LUNG LOBECTOMY Left 04/06/2021   robotic left lower lobectomy 04/06/2021 Dr. Roxan Hockey for Stage 1A adenocarcinoma   NODE DISSECTION Left 04/06/2021   Procedure: NODE DISSECTION;   Surgeon: Melrose Nakayama, MD;  Location: [facility] OR;  Service: Thoracic;  Laterality: Left;   TUBAL LIGATION     VAGINAL HYSTERECTOMY     Menometrorrhagia. Excessive bleeding. Unknown if cervix removed.     SOCIAL HISTORY: Social History   Socioeconomic History   Marital status: Divorced    Spouse name: Not on file   Number of children: 2   Years of education: H/S   Highest education level: High school graduate  Occupational History   Occupation: Disabled   Occupation: retired  Tobacco Use   Smoking status: Former    Packs/day: 0.25    Years: 44.50    Pack years: 11.13    Types: Cigarettes    Quit date: 04/05/2021    Years since quitting: 1.0   Smokeless tobacco: Never   Tobacco comments:    12/23/19 states she quit for 3 months and then started back. Pt is going to talk with MD about this.  Vaping Use   Vaping Use: Never used  Substance and Sexual Activity   Alcohol use: Yes    Comment: rarely - once a year   Drug use: No   Sexual activity: Not on file  Other Topics Concern   Not on file  Social History Narrative   Hx of smoking; quit prior to lung surgery. Lives in [place]- self. No alcohol. 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 On disability sec to spinal pain.    Social Determinants of Health   Financial Resource Strain: Not on file  Food Insecurity: No Food Insecurity   Worried About Charity fundraiser in the Last Year: Never true   Ran Out of Food in the Last Year: Never true  Transportation Needs: No Transportation Needs   Lack of Transportation (Medical): No   Lack of Transportation (Non-Medical): No  Physical Activity: Not on file  Stress: Not on file  Social Connections: Not on file  Intimate Partner Violence: Not on file    FAMILY HISTORY: Family History  Problem Relation Age of Onset   Hypertension Mother    Coronary artery disease Mother    Heart attack Mother        acute   Cancer Mother    Alcohol abuse Father    Depression Father     Hypertension Father    Heart attack Father 59       acute   Alcohol abuse Sister    Hyperlipidemia Sister    Hypertension Sister    Cancer Sister 69   Heart attack Sister        x's 2   Coronary artery disease Sister 16       x's 2   Breast cancer Sister 30    ALLERGIES:  is allergic to atorvastatin, omeprazole, and bupropion.  MEDICATIONS:  Current Outpatient Medications  Medication Sig Dispense Refill   allopurinol (ZYLOPRIM) 100 MG tablet Take 1 tablet (100 mg total) by mouth daily. 90 tablet 4   aspirin 81 MG tablet Take 81 mg by mouth daily.      azelastine (ASTELIN) 0.1 % nasal spray Place 2 sprays into both nostrils 2 (two) times daily. Use in each nostril as directed 30 mL 2   bisoprolol (ZEBETA) 10 MG tablet Take 1 tablet (10 mg total) by mouth  daily. 90 tablet 4   buPROPion ER (WELLBUTRIN SR) 100 MG 12 hr tablet Take 1 tablet (100 mg total) by mouth 2 (two) times daily. 60 tablet 1   Cholecalciferol 25 MCG (1000 UT) capsule Take 1,000 Units by mouth daily.     clopidogrel (PLAVIX) 75 MG tablet Take 1 tablet (75 mg total) by mouth daily. 90 tablet 1   cyanocobalamin 1000 MCG tablet Take 1,000 mcg by mouth daily.     gabapentin (NEURONTIN) 300 MG capsule Take 1 capsule (300 mg total) by mouth 3 (three) times daily AND 2 capsules (600 mg total) at bedtime.     ibuprofen (ADVIL) 200 MG tablet Take 400 mg by mouth 2 (two) times daily as needed (headaches).     lisinopril-hydrochlorothiazide (ZESTORETIC) 20-12.5 MG tablet Take 1 tablet by mouth daily. 90 tablet 4   montelukast (SINGULAIR) 10 MG tablet Take 1 tablet (10 mg total) by mouth at bedtime. 30 tablet 3   OZEMPIC, 1 MG/DOSE, 4 MG/3ML SOPN Inject 1 mg into the skin once a week. 3 mL 0   pantoprazole (PROTONIX) 20 MG tablet Take 1 tablet (20 mg total) by mouth daily. 90 tablet 4   PROAIR HFA 108 (90 Base) MCG/ACT inhaler Inhale 2 puffs into the lungs every 6 (six) hours as needed for wheezing or shortness of breath. 8.5 g 4    rosuvastatin (CRESTOR) 20 MG tablet Take 1 tablet (20 mg total) by mouth daily. 90 tablet 4   fluticasone (FLONASE) 50 MCG/ACT nasal spray Place into both nostrils daily. (Patient not taking: Reported on 05/10/2022)     No current facility-administered medications for this visit.      Marland Kitchen  PHYSICAL EXAMINATION: ECOG PERFORMANCE STATUS: 1 - Symptomatic but completely ambulatory  Vitals:   05/10/22 1053  BP: 129/80  Pulse: 69  Temp: (!) 97.2 F (36.2 C)  SpO2: 100%   Filed Weights   05/10/22 1053  Weight: 228 lb 12.8 oz (103.8 kg)    Physical Exam HENT:     Head: Normocephalic and atraumatic.     Mouth/Throat:     Pharynx: No oropharyngeal exudate.  Eyes:     Pupils: Pupils are equal, round, and reactive to light.  Cardiovascular:     Rate and Rhythm: Normal rate and regular rhythm.  Pulmonary:     Comments: Decreased breath sounds bilaterally.  No wheeze or crackles Abdominal:     General: Bowel sounds are normal. There is no distension.     Palpations: Abdomen is soft. There is no mass.     Tenderness: There is no abdominal tenderness. There is no guarding or rebound.  Musculoskeletal:        General: No tenderness. Normal range of motion.     Cervical back: Normal range of motion and neck supple.  Skin:    General: Skin is warm.  Neurological:     Mental Status: She is alert and oriented to person, place, and time.  Psychiatric:        Mood and Affect: Affect normal.     LABORATORY DATA:  I have reviewed the data as listed Lab Results  Component Value Date   WBC 10.1 05/10/2022   HGB 15.4 (H) 05/10/2022   HCT 46.2 (H) 05/10/2022   MCV 84.5 05/10/2022   PLT 230 05/10/2022   Recent Labs    11/09/21 0938 02/13/22 1027 05/08/22 1144 05/10/22 0957  NA 139 142  --  136  K 3.9 3.6  --  3.4*  CL 102 101  --  102  CO2 25 25  --  27  GLUCOSE 112* 99  --  104*  BUN 19 15  --  14  CREATININE 0.63 0.71 0.70 0.66  CALCIUM 9.8 10.3  --  9.2  GFRNONAA >60   --   --  >60  PROT 7.3 7.0  --  7.4  ALBUMIN 4.0 4.6  --  3.9  AST 27 22  --  21  ALT 21 17  --  16  ALKPHOS 93 116  --  104  BILITOT 0.6 0.6  --  0.5    RADIOGRAPHIC STUDIES: I have personally reviewed the radiological images as listed and agreed with the findings in the report. CT Chest W Contrast  Result Date: 05/08/2022 CLINICAL DATA:  Non-small cell lung cancer, lower left chest soreness. * Tracking Code: BO * EXAM: CT CHEST WITH CONTRAST TECHNIQUE: Multidetector CT imaging of the chest was performed during intravenous contrast administration. RADIATION DOSE REDUCTION: This exam was performed according to the departmental dose-optimization program which includes automated exposure control, adjustment of the mA and/or kV according to patient size and/or use of iterative reconstruction technique. CONTRAST:  35mL OMNIPAQUE IOHEXOL 300 MG/ML  SOLN COMPARISON:  11/07/2021 and 01/01/2018. FINDINGS: Cardiovascular: Atherosclerotic calcification of the aorta and coronary arteries. Heart size normal. No pericardial effusion. Mediastinum/Nodes: No pathologically enlarged mediastinal, hilar or axillary lymph nodes. Esophagus is unremarkable. Lungs/Pleura: Centrilobular emphysema. Smoking related respiratory bronchiolitis. 8 x 10 mm ground-glass nodule in the posterior right lower lobe (3/85), stable from 11/07/2021 but overall increased from 01/01/2018, at which time it measured 5 mm. 11 mm subsolid nodule in the posterolateral right lower lobe with a 3 mm internal solid component (3/78), also unchanged from 11/07/2021 but enlarged from 01/01/2018, 7 mm. Left lower lobectomy. No pleural fluid. Airway is otherwise unremarkable. Upper Abdomen: Liver may be decreased in attenuation diffusely. Probable small pseudotumor along the posterior right hepatic lobe (2/134), unchanged. Liver, gallbladder and right adrenal gland are unremarkable. New left adrenal mass measures at least 3.6 x 3.8 cm. Scarring in the right  kidney. Visualized portions of the kidneys, spleen, pancreas, stomach and bowel are grossly unremarkable. Similar 11 mm portacaval lymph node. Partially visualized aortic endograft stent. Atherosclerotic calcification of the aorta. Musculoskeletal: Degenerative changes in the spine. No worrisome lytic or sclerotic lesions. IMPRESSION: 1. New left adrenal metastasis. 2. Ground-glass and part solid nodules in the peripheral right lower lobe, unchanged from 11/07/2021 but slightly enlarged from 01/01/2018. Findings are suspicious for indolent adenocarcinoma. Continued attention on follow-up is recommended. 3. Liver may be steatotic. 4. Aortic atherosclerosis (ICD10-I70.0). Coronary artery calcification. 5.  Emphysema (ICD10-J43.9). Electronically Signed   By: Lorin Picket M.D.   On: 05/08/2022 15:43     ASSESSMENT & PLAN:   Primary cancer of left lower lobe of lung ([place]) #MAY 2022- Left lower lobe-adenocarcinoma stage I s/p lobectomy.  CT scan 4th June 2023-highly concerning for recurrent/metastatic disease with-. New left adrenal metastasis;  Ground-glass and part solid nodules in the peripheral right lower lobe, unchanged from 11/07/2021 but slightly enlarged from 01/01/2018. Findings are suspicious for indolent adenocarcinoma.  Check NGS-foundation 1.   #I reviewed the significant concerns for recurrence based on above imaging.  Recommend further evaluation with a PET scan.  Discussed that we will review at the tumor conference/next week.  Also recommend further work-up for adrenal mass.   # Post throacotomy pain/tingling and numbness:  Continue gabapentin -300 mg TID;  and then at extra at night.  STABLE.   # CAD/ PVD-clinicallySTABLE.  # COPD-stable encouraged continue to avoid smoking; again counseled to quit smoking; recommend evaluation with pulmonary.    #Right ovarian mass [PET scan 2021] ~5.8 cm; followed by gynecology.  Awaiting repeat ultrasound in June, 2023.  However given the recent  concerning findings and above CT scan; also pending PET scan-discussed that would recommend prioritizing her appointments at this time.  Discussed with GYN oncology nurse navigator-okay to hold off GYN work-up/surveillance at this time.  #Incidental findings on Imaging CT, 2023: Emphysema atherosclerosis, fatty liver;  I reviewed/discussed/counseled the patient.   # I offered to call the patient's family to give an update on clinical status; patient declines.Pt states she will pass the information to her family.  # DISPOSITION: # PET scan ASAP # follow up on June 16th-Dr..B  # I reviewed the blood work- with the patient in detail; also reviewed the imaging independently [as summarized above]; and with the patient in detail.    All questions were answered. The patient knows to call the clinic with any problems, questions or concerns.    Cammie Sickle, MD 05/10/2022 11:09 PM

## 2022-05-10 NOTE — Progress Notes (Signed)
Scan results.  C/o of cough and chest rattle x1 month.

## 2022-05-10 NOTE — Assessment & Plan Note (Addendum)
#  MAY 2022- Left lower lobe-adenocarcinoma stage I s/p lobectomy.  CT scan 4th June 2023-highly concerning for recurrent/metastatic disease with-. New left adrenal metastasis;  Ground-glass and part solid nodules in the peripheral right lower lobe, unchanged from 11/07/2021 but slightly enlarged from 01/01/2018. Findings are suspicious for indolent adenocarcinoma.  Check NGS-foundation 1.   #I reviewed the significant concerns for recurrence based on above imaging.  Recommend further evaluation with a PET scan.  Discussed that we will review at the tumor conference/next week.  Also recommend further work-up for adrenal mass.   # Post throacotomy pain/tingling and numbness:  Continue gabapentin -300 mg TID; and then at extra at night.  STABLE.   # CAD/ PVD-clinicallySTABLE.  # COPD-stable encouraged continue to avoid smoking; again counseled to quit smoking; recommend evaluation with pulmonary.    #Right ovarian mass [PET scan 2021] ~5.8 cm; followed by gynecology.  Awaiting repeat ultrasound in June, 2023.  However given the recent concerning findings and above CT scan; also pending PET scan-discussed that would recommend prioritizing her appointments at this time.  Discussed with GYN oncology nurse navigator-okay to hold off GYN work-up/surveillance at this time.  #Incidental findings on Imaging CT, 2023: Emphysema atherosclerosis, fatty liver;  I reviewed/discussed/counseled the patient.   # I offered to call the patient's family to give an update on clinical status; patient declines.Pt states she will pass the information to her family.  # DISPOSITION: # PET scan ASAP # follow up on June 16th-Dr..B  # I reviewed the blood work- with the patient in detail; also reviewed the imaging independently [as summarized above]; and with the patient in detail.

## 2022-05-11 ENCOUNTER — Encounter: Payer: Self-pay | Admitting: *Deleted

## 2022-05-11 DIAGNOSIS — C3432 Malignant neoplasm of lower lobe, left bronchus or lung: Secondary | ICD-10-CM | POA: Diagnosis not present

## 2022-05-11 NOTE — Progress Notes (Signed)
Foundation One ordered on lung resection sample from 04/06/21.

## 2022-05-16 ENCOUNTER — Ambulatory Visit: Payer: Medicare Other

## 2022-05-17 ENCOUNTER — Other Ambulatory Visit: Payer: Medicare Other

## 2022-05-17 ENCOUNTER — Encounter: Payer: Self-pay | Admitting: Internal Medicine

## 2022-05-17 ENCOUNTER — Ambulatory Visit: Payer: Medicare Other

## 2022-05-18 ENCOUNTER — Other Ambulatory Visit: Payer: Medicare Other

## 2022-05-18 NOTE — Progress Notes (Signed)
Tumor Board Documentation  Laura Nielsen was presented by Dr Rogue Bussing at our Tumor Board on 05/18/2022, which included representatives from medical oncology, pharmacy, pulmonology, genetics, radiology, pathology, radiation oncology, navigation, research, internal medicine, palliative care.  Laura Nielsen currently presents as a current patient, for discussion with history of the following treatments: surgical intervention(s).  Additionally, we reviewed previous medical and familial history, history of present illness, and recent lab results along with all available histopathologic and imaging studies. The tumor board considered available treatment options and made the following recommendations: Biopsy, Additional screening (PET Scan) Bx Adrenal lesion  The following procedures/referrals were also placed: No orders of the defined types were placed in this encounter.   Clinical Trial Status:     Staging used: Pathologic Stage AJCC Staging:       Group: Stage I A Adenicarcinoma of LLL Lung   National site-specific guidelines NCCN were discussed with respect to the case.  Tumor board is a meeting of clinicians from various specialty areas who evaluate and discuss patients for whom a multidisciplinary approach is being considered. Final determinations in the plan of care are those of the provider(s). The responsibility for follow up of recommendations given during tumor board is that of the provider.   Today's extended care, comprehensive team conference, Laura Nielsen was not present for the discussion and was not examined.   Multidisciplinary Tumor Board is a multidisciplinary case peer review process.  Decisions discussed in the Multidisciplinary Tumor Board reflect the opinions of the specialists present at the conference without having examined the patient.  Ultimately, treatment and diagnostic decisions rest with the primary provider(s) and the patient.

## 2022-05-19 DIAGNOSIS — C3432 Malignant neoplasm of lower lobe, left bronchus or lung: Secondary | ICD-10-CM | POA: Diagnosis not present

## 2022-05-23 ENCOUNTER — Ambulatory Visit
Admission: RE | Admit: 2022-05-23 | Discharge: 2022-05-23 | Disposition: A | Discharge status: Home / Self Care | Payer: Medicare Other | Source: Ambulatory Visit | Attending: Internal Medicine | Admitting: Internal Medicine

## 2022-05-23 DIAGNOSIS — C7972 Secondary malignant neoplasm of left adrenal gland: Secondary | ICD-10-CM | POA: Diagnosis not present

## 2022-05-23 DIAGNOSIS — R590 Localized enlarged lymph nodes: Secondary | ICD-10-CM | POA: Diagnosis not present

## 2022-05-23 DIAGNOSIS — C3490 Malignant neoplasm of unspecified part of unspecified bronchus or lung: Secondary | ICD-10-CM | POA: Diagnosis not present

## 2022-05-23 DIAGNOSIS — R911 Solitary pulmonary nodule: Secondary | ICD-10-CM | POA: Diagnosis not present

## 2022-05-23 DIAGNOSIS — C3492 Malignant neoplasm of unspecified part of left bronchus or lung: Secondary | ICD-10-CM | POA: Diagnosis not present

## 2022-05-23 LAB — GLUCOSE, CAPILLARY: Glucose-Capillary: 123 mg/dL — ABNORMAL HIGH (ref 70–99)

## 2022-05-23 MED ORDER — FLUDEOXYGLUCOSE F - 18 (FDG) INJECTION
11.8000 | Freq: Once | INTRAVENOUS | Status: AC | PRN
  Administered 2022-05-23: 12.77 via INTRAVENOUS

## 2022-05-26 ENCOUNTER — Inpatient Hospital Stay: Payer: Medicare Other | Attending: Internal Medicine | Admitting: Internal Medicine

## 2022-05-26 ENCOUNTER — Encounter: Payer: Self-pay | Admitting: Internal Medicine

## 2022-05-26 ENCOUNTER — Telehealth: Payer: Self-pay

## 2022-05-26 VITALS — BP 129/79 | HR 73 | Temp 98.0°F | Resp 18 | Ht 68.0 in | Wt 229.8 lb

## 2022-05-26 DIAGNOSIS — Z79899 Other long term (current) drug therapy: Secondary | ICD-10-CM | POA: Diagnosis not present

## 2022-05-26 DIAGNOSIS — E279 Disorder of adrenal gland, unspecified: Secondary | ICD-10-CM | POA: Diagnosis not present

## 2022-05-26 DIAGNOSIS — E278 Other specified disorders of adrenal gland: Secondary | ICD-10-CM

## 2022-05-26 DIAGNOSIS — R530 Neoplastic (malignant) related fatigue: Secondary | ICD-10-CM

## 2022-05-26 DIAGNOSIS — F17200 Nicotine dependence, unspecified, uncomplicated: Secondary | ICD-10-CM | POA: Insufficient documentation

## 2022-05-26 DIAGNOSIS — C3432 Malignant neoplasm of lower lobe, left bronchus or lung: Secondary | ICD-10-CM

## 2022-05-26 DIAGNOSIS — N839 Noninflammatory disorder of ovary, fallopian tube and broad ligament, unspecified: Secondary | ICD-10-CM | POA: Diagnosis not present

## 2022-05-26 DIAGNOSIS — R0789 Other chest pain: Secondary | ICD-10-CM | POA: Insufficient documentation

## 2022-05-26 DIAGNOSIS — Z902 Acquired absence of lung [part of]: Secondary | ICD-10-CM | POA: Insufficient documentation

## 2022-05-26 DIAGNOSIS — J449 Chronic obstructive pulmonary disease, unspecified: Secondary | ICD-10-CM | POA: Diagnosis not present

## 2022-05-26 NOTE — Progress Notes (Signed)
 PET results

## 2022-05-26 NOTE — Progress Notes (Signed)
Last week patient had an episode of low abdominal pain at night.  When she woke up the next morning pain was better and had a bowel movement with orange tinge mucous.

## 2022-05-28 ENCOUNTER — Other Ambulatory Visit: Payer: Self-pay | Admitting: Family Medicine

## 2022-05-28 DIAGNOSIS — E119 Type 2 diabetes mellitus without complications: Secondary | ICD-10-CM

## 2022-05-29 ENCOUNTER — Inpatient Hospital Stay: Payer: Medicare Other

## 2022-05-29 NOTE — Telephone Encounter (Signed)
Requested medication (s) are due for refill today: yes  Requested medication (s) are on the active medication list: yes  Last refill:  05/17/2021 3ml with 0 RF  Future visit scheduled: no  Notes to clinic:  I have to assume since this 3ml rx is a year old that the pt is no longer on it. However, it remains on the current med list, please assess.      Requested Prescriptions  Pending Prescriptions Disp Refills   OZEMPIC, 1 MG/DOSE, 4 MG/3ML SOPN [Pharmacy Med Name: OZEMPIC 1 MG/DOSE (4 MG/3 ML)] 3 mL 0    Sig: Inject 1 mg into the skin once a week.     Endocrinology:  Diabetes - GLP-1 Receptor Agonists - semaglutide Failed - 05/28/2022  8:51 AM      Failed - HBA1C in normal range and within 180 days    Hgb A1c MFr Bld  Date Value Ref Range Status  02/13/2022 5.8 (H) 4.8 - 5.6 % Final    Comment:             Prediabetes: 5.7 - 6.4          Diabetes: >6.4          Glycemic control for adults with diabetes: <7.0          Passed - Cr in normal range and within 360 days    Creat  Date Value Ref Range Status  08/30/2017 0.60 0.50 - 0.99 mg/dL Final    Comment:    For patients >8 years of age, the reference limit for Creatinine is approximately 13% higher for people identified as African-American. .    Creatinine, Ser  Date Value Ref Range Status  05/10/2022 0.66 0.44 - 1.00 mg/dL Final   Creatinine, POC  Date Value Ref Range Status  12/12/2017 n/a mg/dL Final         Passed - Valid encounter within last 6 months    Recent Outpatient Visits           3 months ago Type 2 diabetes mellitus without complication, without long-term current use of insulin [facility])   [facility] Malva Limes, MD   5 months ago Acute cough   [employer], Oswaldo Conroy, PA-C   10 months ago Benign essential HTN   [facility] Malva Limes, MD   1 year ago Type 2 diabetes mellitus without complication, without long-term current use of insulin  [facility])   [facility] Malva Limes, MD   1 year ago Type 2 diabetes mellitus without complication, without long-term current use of insulin [facility])   [facility] Sherrie Mustache, Demetrios Isaacs, MD

## 2022-05-31 NOTE — Telephone Encounter (Signed)
Spoke with Dominick at Dr. Sabino Snipes office to check status of clearance, he will check on this and give [place] a call back.

## 2022-06-02 ENCOUNTER — Telehealth: Payer: Self-pay

## 2022-06-02 NOTE — Telephone Encounter (Signed)
I haven't seen form. I tried returning call to request that they refax form. Left detailed message on voice message system.

## 2022-06-02 NOTE — Telephone Encounter (Signed)
Copied from [place] [id number]. Topic: General - Call Back - No Documentation >> May 31, 2022  9:45 AM Sabas Sous wrote: [phone] Webb Silversmith from the [facility] called to check and see if office received medical clearance form (holding the pt's plavix) please advise >> Jun 02, 2022  3:03 PM Marcellus Scott wrote: [place] called to follow up on medical clearance. Per Arbie Cookey advised Dr.Fisher is out of the office and will not hear back until next week.

## 2022-06-02 NOTE — Telephone Encounter (Signed)
Spoke with Dr. Maralyn Sago office, he is out of the office this week and will address next week.

## 2022-06-05 ENCOUNTER — Telehealth: Payer: Self-pay | Admitting: Family Medicine

## 2022-06-05 ENCOUNTER — Other Ambulatory Visit: Payer: Self-pay | Admitting: Family Medicine

## 2022-06-05 ENCOUNTER — Encounter: Payer: Self-pay | Admitting: Family Medicine

## 2022-06-05 DIAGNOSIS — E119 Type 2 diabetes mellitus without complications: Secondary | ICD-10-CM

## 2022-06-05 NOTE — Telephone Encounter (Signed)
Nira Conn called from the Cancer center saying they have not heard back regarding permission to hold patients plavix for the biopsy that Dr. Rogue Bussing needs to have done  CB#  [phone]

## 2022-06-05 NOTE — Telephone Encounter (Addendum)
Tried returning Heather's call. Left detailed message to call back. Please advise her to refax form when she returns call. OK for [facility] to advise.

## 2022-06-05 NOTE — Telephone Encounter (Signed)
Called Dr. Caryn Section office and request the we be notified of anticoagulation clearance today so we can get her scheduled for the bx.

## 2022-06-05 NOTE — Telephone Encounter (Signed)
Ronny Bacon, Operator with [facility] called, advised to disregard this due to already handled in a duplicate encounter on 9/62/22.

## 2022-06-05 NOTE — Telephone Encounter (Signed)
This encounter was created in error - please disregard.

## 2022-06-05 NOTE — Telephone Encounter (Signed)
Seth Bake calling from Dr. Rogue Bussing at [facility] is calling to check on the status of the order to stop clopidogrel (PLAVIX) 75 MG tablet [[id number]]  5 days prior to port and placement bioposy CB- [phone]  Fax- [phone]

## 2022-06-05 NOTE — Telephone Encounter (Unsigned)
Copied from [place] [id number]. Topic: General - Other >> Jun 05, 2022  9:58 AM Everette C wrote: Reason for CRM: Medication Refill - Medication: OZEMPIC, 1 MG/DOSE, 4 MG/3ML SOPN [[id number]]   Has the patient contacted their pharmacy? Yes.  The patient has gone to their pharmacy twice and been directed to contact their PCP (Agent: If no, request that the patient contact the pharmacy for the refill. If patient does not wish to contact the pharmacy document the reason why and proceed with request.) (Agent: If yes, when and what did the pharmacy advise?)  Preferred Pharmacy (with phone number or street name): [place], [place] [address] Phone: [phone] Fax: [phone] Hours: Not open 24 hours   Has the patient been seen for an appointment in the last year OR does the patient have an upcoming appointment? Yes.    Agent: Please be advised that RX refills may take up to 3 business days. We ask that you follow-up with your pharmacy.

## 2022-06-05 NOTE — Telephone Encounter (Signed)
Spoke with Anderson Malta at Dr. Sabino Snipes office, she will check on this. Letter was re-faxed for clearance.

## 2022-06-07 NOTE — Telephone Encounter (Signed)
Per secure chat with Perry Mount: This pt is scheduled on Tues 7/18 at 8:30a and to arrive at 7:30a.  They will do her CT Bx first and will follow with her IR Port Placement as long as there are not any complications.  The IR Nurse will call her a few days prior to her procedures and go over any instructions with her.  The LAST DAY to take her PLAVIX is Wed 06/14/22.  She will not take it again until 24 hours after her procedures.  If this does not work for her, please let me know.  Thanks  Pt notified, and went over these instructions.

## 2022-06-07 NOTE — Telephone Encounter (Signed)
Clearance letter scanned into chart, Spec sch notified and pending appt.

## 2022-06-07 NOTE — Telephone Encounter (Signed)
Refilled 06/06/22 3 ml with 2 refills. Requested Prescriptions  Refused Prescriptions Disp Refills  . Semaglutide, 1 MG/DOSE, (OZEMPIC, 1 MG/DOSE,) 4 MG/3ML SOPN 3 mL 0    Sig: Inject 1 mg into the skin once a week.     Endocrinology:  Diabetes - GLP-1 Receptor Agonists - semaglutide Failed - 06/05/2022  1:04 PM      Failed - HBA1C in normal range and within 180 days    Hgb A1c MFr Bld  Date Value Ref Range Status  02/13/2022 5.8 (H) 4.8 - 5.6 % Final    Comment:             Prediabetes: 5.7 - 6.4          Diabetes: >6.4          Glycemic control for adults with diabetes: <7.0          Passed - Cr in normal range and within 360 days    Creat  Date Value Ref Range Status  08/30/2017 0.60 0.50 - 0.99 mg/dL Final    Comment:    For patients >39 years of age, the reference limit for Creatinine is approximately 13% higher for people identified as African-American. .    Creatinine, Ser  Date Value Ref Range Status  05/10/2022 0.66 0.44 - 1.00 mg/dL Final   Creatinine, POC  Date Value Ref Range Status  12/12/2017 n/a mg/dL Final         Passed - Valid encounter within last 6 months    Recent Outpatient Visits          3 months ago Type 2 diabetes mellitus without complication, without long-term current use of insulin [facility])   [facility] Birdie Sons, MD   5 months ago Acute cough   [employer], Dani Gobble, PA-C   11 months ago Benign essential HTN   [facility] Birdie Sons, MD   1 year ago Type 2 diabetes mellitus without complication, without long-term current use of insulin [facility])   [facility] Birdie Sons, MD   1 year ago Type 2 diabetes mellitus without complication, without long-term current use of insulin [facility])   [facility] Birdie Sons, MD      Future Appointments            In 2 weeks Fisher, Kirstie Peri, MD [facility], [facility]

## 2022-06-19 ENCOUNTER — Other Ambulatory Visit: Payer: Self-pay | Admitting: Student

## 2022-06-19 DIAGNOSIS — C3432 Malignant neoplasm of lower lobe, left bronchus or lung: Secondary | ICD-10-CM

## 2022-06-20 ENCOUNTER — Ambulatory Visit
Admission: RE | Admit: 2022-06-20 | Discharge: 2022-06-20 | Disposition: A | Discharge status: Home / Self Care | Payer: Medicare Other | Source: Ambulatory Visit | Attending: Internal Medicine | Admitting: Internal Medicine

## 2022-06-20 DIAGNOSIS — Z87891 Personal history of nicotine dependence: Secondary | ICD-10-CM | POA: Insufficient documentation

## 2022-06-20 DIAGNOSIS — Z7902 Long term (current) use of antithrombotics/antiplatelets: Secondary | ICD-10-CM | POA: Diagnosis not present

## 2022-06-20 DIAGNOSIS — E278 Other specified disorders of adrenal gland: Secondary | ICD-10-CM

## 2022-06-20 DIAGNOSIS — Z85118 Personal history of other malignant neoplasm of bronchus and lung: Secondary | ICD-10-CM | POA: Insufficient documentation

## 2022-06-20 DIAGNOSIS — C3432 Malignant neoplasm of lower lobe, left bronchus or lung: Secondary | ICD-10-CM

## 2022-06-20 DIAGNOSIS — C3490 Malignant neoplasm of unspecified part of unspecified bronchus or lung: Secondary | ICD-10-CM | POA: Diagnosis not present

## 2022-06-20 DIAGNOSIS — Z902 Acquired absence of lung [part of]: Secondary | ICD-10-CM | POA: Diagnosis not present

## 2022-06-20 DIAGNOSIS — C7972 Secondary malignant neoplasm of left adrenal gland: Secondary | ICD-10-CM | POA: Diagnosis not present

## 2022-06-20 DIAGNOSIS — D442 Neoplasm of uncertain behavior of parathyroid gland: Secondary | ICD-10-CM | POA: Diagnosis not present

## 2022-06-20 DIAGNOSIS — E279 Disorder of adrenal gland, unspecified: Secondary | ICD-10-CM | POA: Diagnosis present

## 2022-06-20 DIAGNOSIS — Z809 Family history of malignant neoplasm, unspecified: Secondary | ICD-10-CM | POA: Insufficient documentation

## 2022-06-20 DIAGNOSIS — C7492 Malignant neoplasm of unspecified part of left adrenal gland: Secondary | ICD-10-CM | POA: Insufficient documentation

## 2022-06-20 DIAGNOSIS — D4412 Neoplasm of uncertain behavior of left adrenal gland: Secondary | ICD-10-CM | POA: Diagnosis not present

## 2022-06-20 DIAGNOSIS — I9581 Postprocedural hypotension: Secondary | ICD-10-CM | POA: Diagnosis not present

## 2022-06-20 DIAGNOSIS — R590 Localized enlarged lymph nodes: Secondary | ICD-10-CM | POA: Diagnosis not present

## 2022-06-20 DIAGNOSIS — Z452 Encounter for adjustment and management of vascular access device: Secondary | ICD-10-CM | POA: Diagnosis not present

## 2022-06-20 DIAGNOSIS — J449 Chronic obstructive pulmonary disease, unspecified: Secondary | ICD-10-CM | POA: Diagnosis not present

## 2022-06-20 HISTORY — PX: IR IMAGING GUIDED PORT INSERTION: IMG5740

## 2022-06-20 HISTORY — DX: Malignant neoplasm of unspecified part of left adrenal gland: C74.92

## 2022-06-20 LAB — CBC WITH DIFFERENTIAL/PLATELET
Abs Immature Granulocytes: 0.03 10*3/uL (ref 0.00–0.07)
Basophils Absolute: 0.1 10*3/uL (ref 0.0–0.1)
Basophils Relative: 1 %
Eosinophils Absolute: 0.3 10*3/uL (ref 0.0–0.5)
Eosinophils Relative: 4 %
HCT: 45.9 % (ref 36.0–46.0)
Hemoglobin: 15.2 g/dL — ABNORMAL HIGH (ref 12.0–15.0)
Immature Granulocytes: 0 %
Lymphocytes Relative: 35 %
Lymphs Abs: 3.3 10*3/uL (ref 0.7–4.0)
MCH: 27.9 pg (ref 26.0–34.0)
MCHC: 33.1 g/dL (ref 30.0–36.0)
MCV: 84.2 fL (ref 80.0–100.0)
Monocytes Absolute: 0.6 10*3/uL (ref 0.1–1.0)
Monocytes Relative: 7 %
Neutro Abs: 5.1 10*3/uL (ref 1.7–7.7)
Neutrophils Relative %: 53 %
Platelets: 210 10*3/uL (ref 150–400)
RBC: 5.45 MIL/uL — ABNORMAL HIGH (ref 3.87–5.11)
RDW: 13.9 % (ref 11.5–15.5)
WBC: 9.5 10*3/uL (ref 4.0–10.5)
nRBC: 0 % (ref 0.0–0.2)

## 2022-06-20 LAB — BASIC METABOLIC PANEL
Anion gap: 7 (ref 5–15)
BUN: 13 mg/dL (ref 8–23)
CO2: 23 mmol/L (ref 22–32)
Calcium: 9.8 mg/dL (ref 8.9–10.3)
Chloride: 111 mmol/L (ref 98–111)
Creatinine, Ser: 0.66 mg/dL (ref 0.44–1.00)
GFR, Estimated: >60 mL/min (ref >60)
Glucose, Bld: 118 mg/dL — ABNORMAL HIGH (ref 70–99)
Potassium: 3.7 mmol/L (ref 3.5–5.1)
Sodium: 141 mmol/L (ref 135–145)

## 2022-06-20 LAB — PROTIME-INR
INR: 1 (ref 0.8–1.2)
Prothrombin Time: 13.2 seconds (ref 11.4–15.2)

## 2022-06-20 MED ORDER — FENTANYL CITRATE (PF) 100 MCG/2ML IJ SOLN
INTRAMUSCULAR | Status: AC | PRN
  Administered 2022-06-20: 50 ug via INTRAVENOUS
  Administered 2022-06-20 (×2): 25 ug via INTRAVENOUS

## 2022-06-20 MED ORDER — MIDAZOLAM HCL 2 MG/2ML IJ SOLN
INTRAMUSCULAR | Status: AC
  Filled 2022-06-20: qty 2

## 2022-06-20 MED ORDER — MIDAZOLAM HCL 2 MG/2ML IJ SOLN
INTRAMUSCULAR | Status: AC | PRN
  Administered 2022-06-20: .5 mg via INTRAVENOUS
  Administered 2022-06-20: 1 mg via INTRAVENOUS

## 2022-06-20 MED ORDER — MIDAZOLAM HCL 2 MG/2ML IJ SOLN
INTRAMUSCULAR | Status: AC | PRN
  Administered 2022-06-20 (×2): .5 mg via INTRAVENOUS
  Administered 2022-06-20: 1 mg via INTRAVENOUS

## 2022-06-20 MED ORDER — FENTANYL CITRATE (PF) 100 MCG/2ML IJ SOLN
INTRAMUSCULAR | Status: AC
  Filled 2022-06-20: qty 2

## 2022-06-20 MED ORDER — SODIUM CHLORIDE 0.9 % IV SOLN: INTRAVENOUS | Status: DC

## 2022-06-20 MED ORDER — HEPARIN SOD (PORK) LOCK FLUSH 100 UNIT/ML IV SOLN
INTRAVENOUS | Status: AC
  Administered 2022-06-20: 500 [IU]
  Filled 2022-06-20: qty 5

## 2022-06-20 MED ORDER — LIDOCAINE-EPINEPHRINE 1 %-1:100000 IJ SOLN
INTRAMUSCULAR | Status: AC
  Administered 2022-06-20: 14 mL
  Filled 2022-06-20: qty 1

## 2022-06-20 MED ORDER — MIDAZOLAM HCL 5 MG/5ML IJ SOLN
INTRAMUSCULAR | Status: AC | PRN
  Administered 2022-06-20: .5 mg via INTRAVENOUS

## 2022-06-20 MED ORDER — FENTANYL CITRATE (PF) 100 MCG/2ML IJ SOLN
INTRAMUSCULAR | Status: AC | PRN
  Administered 2022-06-20 (×2): 25 ug via INTRAVENOUS
  Administered 2022-06-20: 50 ug via INTRAVENOUS

## 2022-06-20 NOTE — Procedures (Signed)
Interventional Radiology Procedure Note  Procedure: CT BX LEFT ADRENAL MET    Complications: None  Estimated Blood Loss:  MIN  Findings: 36 G CORE X 3    M. Daryll Brod, MD

## 2022-06-20 NOTE — H&P (Signed)
Chief Complaint: Patient was seen in consultation today for left adrenal mass biopsy and port a catheter placement at the request of Brahmanday,Govinda R  Referring Physician(s): Cammie Sickle  Supervising Physician: Daryll Brod  Patient Status: [facility] - Out-pt  History of Present Illness: Shaynna Husby is a 66 y.o. female with PMHx significant for tobacco use, COPD, LLL lung cancer adenocarcinoma s/p lobectomy. CT PET imaging 05/2022 concerning for recurrent metastatic disease with enlarged hypermetabolic left adrenal mass, slightly enlarged RLL nodules, adenopathy to the left hilum. Patient follows with oncology and request received for left adrenal mass biopsy and port a catheter placement.   The patient denies any current chest pain or change in chronic shortness of breath. She denies any current blood thinner use, denies any known bleeding or clotting disorder. The patient denies any recent infections, fever or chills. The patient does have sleep apnea but does not use CPAP and does not use chronic oxygen. She has no known complications to sedation, however does get hypotensive after anesthesia.    Past Medical History:  Diagnosis Date   AAA (abdominal aortic aneurysm) ([place])    s/p EVAR AAA 03/27/13   Arthritis    Asthma    Complication of anesthesia    BP drops after   History of kidney stones    Osteoporosis    Sleep apnea     Past Surgical History:  Procedure Laterality Date   ABDOMINAL AORTIC ENDOVASCULAR STENT GRAFT  03/27/2013   Dr. Hortencia Pilar   Cardiac catheterization  02/2009   70-80% stenosis RCA stent placed. started on Plavix   CARDIAC CATHETERIZATION     COLONOSCOPY WITH PROPOFOL N/A 01/05/2021   Procedure: COLONOSCOPY WITH PROPOFOL;  Surgeon: Virgel Manifold, MD;  Location: [facility] ENDOSCOPY;  Service: Endoscopy;  Laterality: N/A;   CORONARY ANGIOPLASTY     stent placed   ESOPHAGOGASTRODUODENOSCOPY (EGD) WITH PROPOFOL N/A 01/05/2021    Procedure: ESOPHAGOGASTRODUODENOSCOPY (EGD) WITH PROPOFOL;  Surgeon: Virgel Manifold, MD;  Location: [facility] ENDOSCOPY;  Service: Endoscopy;  Laterality: N/A;   INTERCOSTAL NERVE BLOCK Left 04/06/2021   Procedure: INTERCOSTAL NERVE BLOCK;  Surgeon: Melrose Nakayama, MD;  Location: [place];  Service: Thoracic;  Laterality: Left;   [place] Left 04/06/2021   robotic left lower lobectomy 04/06/2021 Dr. Roxan Hockey for Stage 1A adenocarcinoma   NODE DISSECTION Left 04/06/2021   Procedure: NODE DISSECTION;  Surgeon: Melrose Nakayama, MD;  Location: [place];  Service: Thoracic;  Laterality: Left;   TUBAL LIGATION     VAGINAL HYSTERECTOMY     Menometrorrhagia. Excessive bleeding. Unknown if cervix removed.     Allergies: Atorvastatin, Omeprazole, and Bupropion  Medications: Prior to Admission medications   Medication Sig Start Date End Date Taking? Authorizing Provider  allopurinol (ZYLOPRIM) 100 MG tablet Take 1 tablet (100 mg total) by mouth daily. 05/30/21  Yes Birdie Sons, MD  aspirin 81 MG tablet Take 81 mg by mouth daily.  03/25/09  Yes [provider]  azelastine (ASTELIN) 0.1 % nasal spray Place 2 sprays into both nostrils 2 (two) times daily. Use in each nostril as directed 02/13/22  Yes Fisher, Kirstie Peri, MD  bisoprolol (ZEBETA) 10 MG tablet Take 1 tablet (10 mg total) by mouth daily. 04/12/22  Yes Birdie Sons, MD  buPROPion ER [facility] SR) 100 MG 12 hr tablet Take 1 tablet (100 mg total) by mouth 2 (two) times daily. 02/13/22  Yes Birdie Sons,  MD  Cholecalciferol 25 MCG (1000 UT) capsule Take 1,000 Units by mouth daily.   Yes [provider]  clopidogrel (PLAVIX) 75 MG tablet Take 1 tablet (75 mg total) by mouth daily. 01/20/22  Yes Birdie Sons, MD  fluticasone (FLONASE) 50 MCG/ACT nasal spray Place into both nostrils daily.   Yes [provider]  gabapentin (NEURONTIN) 300 MG capsule Take 1 capsule  (300 mg total) by mouth 3 (three) times daily AND 2 capsules (600 mg total) at bedtime. 07/12/21  Yes Birdie Sons, MD  ibuprofen (ADVIL) 200 MG tablet Take 400 mg by mouth 2 (two) times daily as needed (headaches).   Yes [provider]  montelukast (SINGULAIR) 10 MG tablet Take 1 tablet (10 mg total) by mouth at bedtime. 02/13/22  Yes Birdie Sons, MD  pantoprazole (PROTONIX) 20 MG tablet Take 1 tablet (20 mg total) by mouth daily. 12/23/21  Yes Birdie Sons, MD  PROAIR HFA [phone] Base) MCG/ACT inhaler Inhale 2 puffs into the lungs every 6 (six) hours as needed for wheezing or shortness of breath. 12/02/21  Yes Birdie Sons, MD  rosuvastatin (CRESTOR) 20 MG tablet Take 1 tablet (20 mg total) by mouth daily. 08/29/21  Yes Birdie Sons, MD  Semaglutide, 1 MG/DOSE, (OZEMPIC, 1 MG/DOSE,) 4 MG/3ML SOPN Inject 1 mg into the skin once a week. 06/06/22  Yes Birdie Sons, MD  cyanocobalamin 1000 MCG tablet Take 1,000 mcg by mouth daily.    [provider]  lisinopril-hydrochlorothiazide (ZESTORETIC) 20-12.5 MG tablet Take 1 tablet by mouth daily. 08/29/21   Birdie Sons, MD     Family History  Problem Relation Age of Onset   Hypertension Mother    Coronary artery disease Mother    Heart attack Mother        acute   Cancer Mother    Alcohol abuse Father    Depression Father    Hypertension Father    Heart attack Father 8       acute   Alcohol abuse Sister    Hyperlipidemia Sister    Hypertension Sister    Cancer Sister 60   Heart attack Sister        x's 2   Coronary artery disease Sister 28       x's 2   Breast cancer Sister 72    Social History   Socioeconomic History   Marital status: Divorced    Spouse name: Not on file   Number of children: 2   Years of education: H/S   Highest education level: High school graduate  Occupational History   Occupation: Disabled   Occupation: retired  Tobacco Use   Smoking status: Former    Packs/day:  0.25    Years: 44.50    Total pack years: 11.13    Types: Cigarettes    Quit date: 04/05/2021    Years since quitting: 1.2   Smokeless tobacco: Never   Tobacco comments:    12/23/19 states she quit for 3 months and then started back. Pt is going to talk with MD about this.  Vaping Use   Vaping Use: Never used  Substance and Sexual Activity   Alcohol use: Yes    Comment: rarely - once a year   Drug use: No   Sexual activity: Not on file  Other Topics Concern   Not on file  Social History Narrative   Hx of smoking; quit prior to lung surgery. Lives  in [place]- self. No alcohol. 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 On disability sec to spinal pain.    Social Determinants of Health   Financial Resource Strain: Low Risk  (02/10/2021)   Overall Financial Resource Strain (CARDIA)    Difficulty of Paying Living Expenses: Not hard at all  Food Insecurity: No Food Insecurity (03/21/2022)   Hunger Vital Sign    Worried About Running Out of Food in the Last Year: Never true    Ran Out of Food in the Last Year: Never true  Transportation Needs: No Transportation Needs (03/21/2022)   PRAPARE - Hydrologist (Medical): No    Lack of Transportation (Non-Medical): No  Physical Activity: Inactive (02/10/2021)   Exercise Vital Sign    Days of Exercise per Week: 0 days    Minutes of Exercise per Session: 0 min  Stress: Stress Concern Present (02/10/2021)   [place]    Feeling of Stress : To some extent  Social Connections: Socially Isolated (02/10/2021)   Social Connection and Isolation Panel [NHANES]    Frequency of Communication with Friends and Family: More than three times a week    Frequency of Social Gatherings with Friends and Family: Once a week    Attends Religious Services: Never    Marine scientist or Organizations: No    Attends Music therapist: Never    Marital  Status: Divorced    Review of Systems: A 12 point ROS discussed and pertinent positives are indicated in the HPI above.  All other systems are negative.  Review of Systems  Vital Signs: BP 114/79   Pulse 74   Temp 98.4 F (36.9 C) (Oral)   Resp 20   Ht 5\' 8"  (1.727 m)   Wt 230 lb (104.3 kg)   SpO2 96%   BMI 34.97 kg/m   Physical Exam Constitutional:      General: She is not in acute distress.    Appearance: Normal appearance.  HENT:     Head: Normocephalic and atraumatic.  Cardiovascular:     Rate and Rhythm: Normal rate and regular rhythm.  Pulmonary:     Effort: Pulmonary effort is normal. No respiratory distress.  Neurological:     Mental Status: She is alert and oriented to person, place, and time.     Imaging: NM PET Image Restage (PS) Skull Base to Thigh (F-18 FDG)  Result Date: 05/24/2022 CLINICAL DATA:  Subsequent treatment strategy for non-small cell lung cancer. LEFT lobectomy. EXAM: NUCLEAR MEDICINE PET SKULL BASE TO THIGH TECHNIQUE: 12.7 mCi F-18 FDG was injected intravenously. Full-ring PET imaging was performed from the skull base to thigh after the radiotracer. CT data was obtained and used for attenuation correction and anatomic localization. Fasting blood glucose: 123 mg/dl COMPARISON:  PET-CT 08/11/2020, CT chest 05/08/2022 FINDINGS: Mediastinal blood pool activity: SUV max 2.0 Liver activity: SUV max 2.3 NECK: No hypermetabolic lymph nodes in the neck. Incidental CT findings: none CHEST: Hypermetabolic LEFT hilar node with SUV max equal 4.2 on image 93. Corresponding low-density node measuring 13 mm on image 93/CT series 2. Part solid nodule in the RIGHT lower lobe measuring 9 mm (image 100) does not have metabolic activity. Incidental CT findings: none ABDOMEN/PELVIS: Dramatic enlargement of the LEFT adrenal gland to 4 cm with intense metabolic activity (SUV max equal 16.2) RIGHT adrenal gland normal. Abdominopelvic hypermetabolic adenopathy. No abnormal  activity liver. Incidental CT findings:  Simple fluid attenuation cystic lesion in the RIGHT adnexal region measures 6 cm and has no metabolic activity. This lesion not significantly changed from comparison PET-CT scan. SKELETON: No focal hypermetabolic activity to suggest skeletal metastasis. Incidental CT findings: none IMPRESSION: 1. Enlarged 4 cm hypermetabolic LEFT adrenal metastasis. 2. Suspicion of metastatic adenopathy to the LEFT hilum. 3. Part solid nodule in the RIGHT lower lobe without metabolic activity. Recommend attention on follow-up. 4. Benign appearing cyst in the RIGHT adnexa is relatively stable. Electronically Signed   By: Suzy Bouchard M.D.   On: 05/24/2022 10:38    Labs:  CBC: Recent Labs    07/12/21 1131 11/09/21 0938 02/13/22 1027 05/10/22 0957  WBC 8.0 7.7 9.4 10.1  HGB 14.4 15.6* 15.9 15.4*  HCT 44.4 47.0* 47.7* 46.2*  PLT 211 195 237 230    COAGS: Recent Labs    07/12/21 1131  INR 1.0  APTT 25    BMP: Recent Labs    11/09/21 0938 02/13/22 1027 05/08/22 1144 05/10/22 0957  NA 139 142  --  136  K 3.9 3.6  --  3.4*  CL 102 101  --  102  CO2 25 25  --  27  GLUCOSE 112* 99  --  104*  BUN 19 15  --  14  CALCIUM 9.8 10.3  --  9.2  CREATININE 0.63 0.71 0.70 0.66  GFRNONAA >60  --   --  >60    LIVER FUNCTION TESTS: Recent Labs    11/09/21 0938 02/13/22 1027 05/10/22 0957  BILITOT 0.6 0.6 0.5  AST 27 22 21   ALT 21 17 16   ALKPHOS 93 116 104  PROT 7.3 7.0 7.4  ALBUMIN 4.0 4.6 3.9    Assessment and Plan: This is a 66 year old female with PMHx significant for tobacco use, COPD, LLL lung cancer adenocarcinoma s/p lobectomy. CT PET imaging 05/2022 concerning for recurrent metastatic disease with enlarged hypermetabolic left adrenal mass, slightly enlarged RLL nodules, adenopathy to the left hilum. Patient follows with oncology and request received for left adrenal mass biopsy and port a catheter placement.   The patient has been NPO, no blood  thinners taken, labs and vitals have been reviewed.  Risks and benefits of CT guided left adrenal mass biopsy with moderate sedation was discussed with the patient and/or patient's family including, but not limited to bleeding, infection, damage to adjacent structures or low yield requiring additional tests.  Risks and benefits of image guided port-a-catheter placement was discussed with the patient including, but not limited to bleeding, infection, pneumothorax, or fibrin sheath development and need for additional procedures.  All of the patient's questions were answered, patient is agreeable to proceed. Consent signed and in chart.  Thank you for this interesting consult.  I greatly enjoyed meeting Yudith Norlander and look forward to participating in their care.  A copy of this report was sent to the requesting provider on this date.  Electronically Signed: Hedy Jacob, PA-C 06/20/2022, 8:00 AM   I spent a total of 15 Minutes in face to face in clinical consultation, greater than 50% of which was counseling/coordinating care for left adrenal mass biopsy and port a catheter placement.

## 2022-06-20 NOTE — Progress Notes (Signed)
Patient clinically stable post Adrenal biopsy along with Port placement per Dr Annamaria Boots, vitals stable pre and post procedure. Received Versed 2 mg along with Fentanyl 100 mcg IV for procedures each procedure. Awake/alert and oriented post procedures. Report given to Sarita Haver post procedure/specials.

## 2022-06-20 NOTE — Procedures (Signed)
Interventional Radiology Procedure Note  Procedure: RT IJ POWER PORT    Complications: None  Estimated Blood Loss:  MIN  Findings: TIP SVCRA    M. TREVOR Evelette Hollern, MD    

## 2022-06-26 ENCOUNTER — Ambulatory Visit: Payer: Medicare Other | Admitting: Family Medicine

## 2022-06-27 ENCOUNTER — Other Ambulatory Visit: Payer: Self-pay | Admitting: Anatomic Pathology & Clinical Pathology

## 2022-06-27 ENCOUNTER — Telehealth: Payer: Self-pay

## 2022-06-27 LAB — SURGICAL PATHOLOGY

## 2022-06-27 NOTE — Telephone Encounter (Signed)
Per secure chat from Dr. Allena Napoleon: Good afternoon. I am just getting ready to sign out the results of this patient's adrenal mass biopsy. The biopsy is positive for malignancy, unfortunately, beyond that the findings are not entirely specific. Although she has a history of lung adenocarcinoma, the microscopic features and staining pattern do not appear typical of lung adenocarcinoma. I think that Dr. B was planning to send for NGS, but I don't think we will have enough tumor to do so. Most of the biopsy just shows scarring and inflammation.  Will forward to Dr. B once he gets back into office.

## 2022-06-29 ENCOUNTER — Ambulatory Visit (INDEPENDENT_AMBULATORY_CARE_PROVIDER_SITE_OTHER): Payer: Medicare Other | Admitting: Vascular Surgery

## 2022-06-29 ENCOUNTER — Encounter (INDEPENDENT_AMBULATORY_CARE_PROVIDER_SITE_OTHER): Payer: Medicare Other

## 2022-07-04 ENCOUNTER — Inpatient Hospital Stay: Payer: Medicare Other | Attending: Internal Medicine

## 2022-07-04 ENCOUNTER — Encounter: Payer: Self-pay | Admitting: Internal Medicine

## 2022-07-04 ENCOUNTER — Inpatient Hospital Stay (HOSPITAL_BASED_OUTPATIENT_CLINIC_OR_DEPARTMENT_OTHER): Payer: Medicare Other | Admitting: Internal Medicine

## 2022-07-04 ENCOUNTER — Ambulatory Visit: Payer: Self-pay

## 2022-07-04 DIAGNOSIS — C3432 Malignant neoplasm of lower lobe, left bronchus or lung: Secondary | ICD-10-CM | POA: Insufficient documentation

## 2022-07-04 DIAGNOSIS — C7972 Secondary malignant neoplasm of left adrenal gland: Secondary | ICD-10-CM | POA: Diagnosis not present

## 2022-07-04 DIAGNOSIS — Z79899 Other long term (current) drug therapy: Secondary | ICD-10-CM | POA: Diagnosis not present

## 2022-07-04 DIAGNOSIS — R530 Neoplastic (malignant) related fatigue: Secondary | ICD-10-CM

## 2022-07-04 DIAGNOSIS — F17210 Nicotine dependence, cigarettes, uncomplicated: Secondary | ICD-10-CM | POA: Insufficient documentation

## 2022-07-04 DIAGNOSIS — Z5112 Encounter for antineoplastic immunotherapy: Secondary | ICD-10-CM | POA: Diagnosis not present

## 2022-07-04 DIAGNOSIS — E278 Other specified disorders of adrenal gland: Secondary | ICD-10-CM

## 2022-07-04 LAB — CBC WITH DIFFERENTIAL/PLATELET
Abs Immature Granulocytes: 0.02 10*3/uL (ref 0.00–0.07)
Basophils Absolute: 0.1 10*3/uL (ref 0.0–0.1)
Basophils Relative: 1 %
Eosinophils Absolute: 0.3 10*3/uL (ref 0.0–0.5)
Eosinophils Relative: 3 %
HCT: 46.1 % — ABNORMAL HIGH (ref 36.0–46.0)
Hemoglobin: 15.5 g/dL — ABNORMAL HIGH (ref 12.0–15.0)
Immature Granulocytes: 0 %
Lymphocytes Relative: 41 %
Lymphs Abs: 3.4 10*3/uL (ref 0.7–4.0)
MCH: 28.2 pg (ref 26.0–34.0)
MCHC: 33.6 g/dL (ref 30.0–36.0)
MCV: 83.8 fL (ref 80.0–100.0)
Monocytes Absolute: 0.5 10*3/uL (ref 0.1–1.0)
Monocytes Relative: 6 %
Neutro Abs: 4.1 10*3/uL (ref 1.7–7.7)
Neutrophils Relative %: 49 %
Platelets: 211 10*3/uL (ref 150–400)
RBC: 5.5 MIL/uL — ABNORMAL HIGH (ref 3.87–5.11)
RDW: 13.7 % (ref 11.5–15.5)
WBC: 8.3 10*3/uL (ref 4.0–10.5)
nRBC: 0 % (ref 0.0–0.2)

## 2022-07-04 LAB — COMPREHENSIVE METABOLIC PANEL
ALT: 15 U/L (ref 0–44)
AST: 21 U/L (ref 15–41)
Albumin: 4.1 g/dL (ref 3.5–5.0)
Alkaline Phosphatase: 93 U/L (ref 38–126)
Anion gap: 8 (ref 5–15)
BUN: 15 mg/dL (ref 8–23)
CO2: 27 mmol/L (ref 22–32)
Calcium: 9.7 mg/dL (ref 8.9–10.3)
Chloride: 104 mmol/L (ref 98–111)
Creatinine, Ser: 0.7 mg/dL (ref 0.44–1.00)
GFR, Estimated: >60 mL/min (ref >60)
Glucose, Bld: 97 mg/dL (ref 70–99)
Potassium: 3.6 mmol/L (ref 3.5–5.1)
Sodium: 139 mmol/L (ref 135–145)
Total Bilirubin: 0.8 mg/dL (ref 0.3–1.2)
Total Protein: 7.3 g/dL (ref 6.5–8.1)

## 2022-07-04 LAB — TSH: TSH: 2.096 u[IU]/mL (ref 0.350–4.500)

## 2022-07-04 MED ORDER — LIDOCAINE-PRILOCAINE 2.5-2.5 % EX CREA: TOPICAL_CREAM | CUTANEOUS | 3 refills | Status: DC

## 2022-07-04 NOTE — Chronic Care Management (AMB) (Signed)
   07/04/2022  Laura Nielsen 04-19-1956 [id number]  Documentation encounter for case transition.  The care team will follow [name] for care coordination as needed.  [facility] [phone]

## 2022-07-04 NOTE — Progress Notes (Signed)
[facility] NOTE  Patient Care Team: Birdie Sons, MD as PCP - General (Family Medicine) Ubaldo Glassing Javier Docker, MD as Consulting Physician (Cardiology) Schnier, Dolores Lory, MD (Vascular Surgery) Vladimir Crofts, MD as Consulting Physician (Neurology) Milinda Pointer, MD as Referring Physician (Pain Medicine) Pa, Cuartelez (Optometry) Ubaldo Glassing, Javier Docker, MD as Consulting Physician (Cardiology) Virgel Manifold, MD (Inactive) as Consulting Physician (Gastroenterology) Gae Dry, MD as Referring Physician (Obstetrics and Gynecology) Telford Nab, RN as Oncology Nurse Navigator Cammie Sickle, MD as Consulting Physician (Oncology)   CHIEF COMPLAINTS/PURPOSE OF CONSULTATION: lung cancer  #  Oncology History Overview Note  #MAY 2022- LLL nodule 2.3 cm Adenocarcinoma Stage IA; s/p lobectomy.  No adjuvant therapy  # CT scan 4th June 2023-highly concerning for recurrent/metastatic disease with-. New left adrenal metastasis;  Ground-glass and part solid nodules in the peripheral right lower lobe, unchanged from 11/07/2021 but slightly enlarged from 01/01/2018. Findings are suspicious for indolent adenocarcinoma.  F One- PFL1 =80%; KRAS G12C PET scan JUNE 20th- 2023-  Enlarged 4 cm hypermetabolic LEFT adrenal metastasis;  Suspicion of metastatic adenopathy to the LEFT hilum.  Part solid nodule in the RIGHT lower lobe without metabolic activity.   3 ADRENAL BIOPSY- CK7 POSITIVE ADENOCARCINOMA- There is limited tissue remaining for ancillary testing, not likely  sufficient for NGS testing.   # AUG 7th, 2023-single agent Keytruda.    Primary cancer of left lower lobe of lung ([place])  05/09/2021 Initial Diagnosis   Primary cancer of left lower lobe of lung ([place])   07/04/2022 -  Chemotherapy   Patient is on Treatment Plan : LUNG NSCLC Pembrolizumab (200) q21d        HISTORY OF PRESENTING ILLNESS: Alone.  Ambulating independently.  Laura Nielsen 66 y.o.   female history of smoking; prior history of lung cancer; concern for recurrence on PET scan is here to review the results of the biopsy of the left adrenal gland.   Patient continues to have mild shortness of breath on exertion.  Denies any headaches.  Denies any nausea vomiting.  Patient does complain of left lower lobe chest wall pain.  Review of Systems  Constitutional:  Negative for chills, diaphoresis, fever, malaise/fatigue and weight loss.  HENT:  Negative for nosebleeds and sore throat.   Eyes:  Negative for double vision.  Respiratory:  Negative for cough, hemoptysis, sputum production, shortness of breath and wheezing.   Cardiovascular:  Positive for chest pain. Negative for palpitations, orthopnea and leg swelling.  Gastrointestinal:  Negative for abdominal pain, blood in stool, constipation, diarrhea, heartburn, melena, nausea and vomiting.  Genitourinary:  Negative for dysuria, frequency and urgency.  Musculoskeletal:  Positive for back pain and joint pain.  Skin: Negative.  Negative for itching and rash.  Neurological:  Positive for tingling. Negative for dizziness, focal weakness, weakness and headaches.  Endo/Heme/Allergies:  Does not bruise/bleed easily.  Psychiatric/Behavioral:  Negative for depression. The patient is not nervous/anxious and does not have insomnia.      MEDICAL HISTORY:  Past Medical History:  Diagnosis Date   AAA (abdominal aortic aneurysm) ([place])    s/p EVAR AAA 03/27/13   Arthritis    Asthma    Complication of anesthesia    BP drops after   History of kidney stones    Osteoporosis    Sleep apnea     SURGICAL HISTORY: Past Surgical History:  Procedure Laterality Date   ABDOMINAL AORTIC ENDOVASCULAR STENT GRAFT  03/27/2013  Dr. Hortencia Pilar   Cardiac catheterization  02/2009   70-80% stenosis RCA stent placed. started on Plavix   CARDIAC CATHETERIZATION     COLONOSCOPY WITH PROPOFOL N/A 01/05/2021   Procedure: COLONOSCOPY WITH  PROPOFOL;  Surgeon: Virgel Manifold, MD;  Location: [facility] ENDOSCOPY;  Service: Endoscopy;  Laterality: N/A;   CORONARY ANGIOPLASTY     stent placed   ESOPHAGOGASTRODUODENOSCOPY (EGD) WITH PROPOFOL N/A 01/05/2021   Procedure: ESOPHAGOGASTRODUODENOSCOPY (EGD) WITH PROPOFOL;  Surgeon: Virgel Manifold, MD;  Location: [facility] ENDOSCOPY;  Service: Endoscopy;  Laterality: N/A;   INTERCOSTAL NERVE BLOCK Left 04/06/2021   Procedure: INTERCOSTAL NERVE BLOCK;  Surgeon: Melrose Nakayama, MD;  Location: [facility];  Service: Thoracic;  Laterality: Left;   IR IMAGING GUIDED PORT INSERTION  06/20/2022   KIDNEY STONE SURGERY  1999   LUNG LOBECTOMY Left 04/06/2021   robotic left lower lobectomy 04/06/2021 Dr. Roxan Hockey for Stage 1A adenocarcinoma   NODE DISSECTION Left 04/06/2021   Procedure: NODE DISSECTION;  Surgeon: Melrose Nakayama, MD;  Location: [facility];  Service: Thoracic;  Laterality: Left;   TUBAL LIGATION     VAGINAL HYSTERECTOMY     Menometrorrhagia. Excessive bleeding. Unknown if cervix removed.     SOCIAL HISTORY: Social History   Socioeconomic History   Marital status: Divorced    Spouse name: Not on file   Number of children: 2   Years of education: H/S   Highest education level: High school graduate  Occupational History   Occupation: Disabled   Occupation: retired  Tobacco Use   Smoking status: Former    Packs/day: 0.25    Years: 44.50    Total pack years: 11.13    Types: Cigarettes    Quit date: 04/05/2021    Years since quitting: 1.2   Smokeless tobacco: Never   Tobacco comments:    12/23/19 states she quit for 3 months and then started back. Pt is going to talk with MD about this.  Vaping Use   Vaping Use: Never used  Substance and Sexual Activity   Alcohol use: Yes    Comment: rarely - once a year   Drug use: No   Sexual activity: Not on file  Other Topics Concern   Not on file  Social History Narrative   Hx of smoking; quit prior to lung surgery. [place]- [place]. No alcohol. 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 On disability sec to spinal pain.    Social Determinants of Health   Financial Resource Strain: Low Risk  (02/10/2021)   Overall Financial Resource Strain (CARDIA)    Difficulty of Paying Living Expenses: Not hard at all  Food Insecurity: No Food Insecurity (03/21/2022)   Hunger Vital Sign    Worried About Running Out of Food in the Last Year: Never true    Ran Out of Food in the Last Year: Never true  Transportation Needs: No Transportation Needs (03/21/2022)   PRAPARE - Hydrologist (Medical): No    Lack of Transportation (Non-Medical): No  Physical Activity: Inactive (02/10/2021)   Exercise Vital Sign    Days of Exercise per Week: 0 days    Minutes of Exercise per Session: 0 min  Stress: Stress Concern Present (02/10/2021)   Joyce    Feeling of Stress : To some extent  Social Connections: Socially Isolated (02/10/2021)   Social Connection and Isolation Panel [NHANES]    Frequency  of Communication with Friends and Family: More than three times a week    Frequency of Social Gatherings with Friends and Family: Once a week    Attends Religious Services: Never    Marine scientist or Organizations: No    Attends Archivist Meetings: Never    Marital Status: Divorced  Human resources officer Violence: Not At Risk (02/10/2021)   Humiliation, Afraid, Rape, and Kick questionnaire    Fear of Current or Ex-Partner: No    Emotionally Abused: No    Physically Abused: No    Sexually Abused: No    FAMILY HISTORY: Family History  Problem Relation Age of Onset   Hypertension Mother    Coronary artery disease Mother    Heart attack Mother        acute   Cancer Mother    Alcohol abuse Father    Depression Father    Hypertension Father    Heart attack Father 27       acute   Alcohol abuse Sister    Hyperlipidemia  Sister    Hypertension Sister    Cancer Sister 70   Heart attack Sister        x's 2   Coronary artery disease Sister 16       x's 2   Breast cancer Sister 64    ALLERGIES:  is allergic to atorvastatin, omeprazole, and bupropion.  MEDICATIONS:  Current Outpatient Medications  Medication Sig Dispense Refill   allopurinol (ZYLOPRIM) 100 MG tablet Take 1 tablet (100 mg total) by mouth daily. 90 tablet 4   aspirin 81 MG tablet Take 81 mg by mouth daily.      azelastine (ASTELIN) 0.1 % nasal spray Place 2 sprays into both nostrils 2 (two) times daily. Use in each nostril as directed 30 mL 2   bisoprolol (ZEBETA) 10 MG tablet Take 1 tablet (10 mg total) by mouth daily. 90 tablet 4   buPROPion ER (WELLBUTRIN SR) 100 MG 12 hr tablet Take 1 tablet (100 mg total) by mouth 2 (two) times daily. 60 tablet 1   Cholecalciferol 25 MCG (1000 UT) capsule Take 1,000 Units by mouth daily.     clopidogrel (PLAVIX) 75 MG tablet Take 1 tablet (75 mg total) by mouth daily. 90 tablet 1   cyanocobalamin 1000 MCG tablet Take 1,000 mcg by mouth daily.     fluticasone (FLONASE) 50 MCG/ACT nasal spray Place into both nostrils daily.     gabapentin (NEURONTIN) 300 MG capsule Take 1 capsule (300 mg total) by mouth 3 (three) times daily AND 2 capsules (600 mg total) at bedtime.     ibuprofen (ADVIL) 200 MG tablet Take 400 mg by mouth 2 (two) times daily as needed (headaches).     lidocaine-prilocaine (EMLA) cream Apply on the port. 30 -45 min  prior to port access. 30 g 3   lisinopril-hydrochlorothiazide (ZESTORETIC) 20-12.5 MG tablet Take 1 tablet by mouth daily. 90 tablet 4   montelukast (SINGULAIR) 10 MG tablet Take 1 tablet (10 mg total) by mouth at bedtime. 30 tablet 3   pantoprazole (PROTONIX) 20 MG tablet Take 1 tablet (20 mg total) by mouth daily. 90 tablet 4   PROAIR HFA 108 (90 Base) MCG/ACT inhaler Inhale 2 puffs into the lungs every 6 (six) hours as needed for wheezing or shortness of breath. 8.5 g 4    rosuvastatin (CRESTOR) 20 MG tablet Take 1 tablet (20 mg total) by mouth daily. 90 tablet 4  Semaglutide, 1 MG/DOSE, (OZEMPIC, 1 MG/DOSE,) 4 MG/3ML SOPN Inject 1 mg into the skin once a week. 3 mL 2   No current facility-administered medications for this visit.      Marland Kitchen  PHYSICAL EXAMINATION: ECOG PERFORMANCE STATUS: 1 - Symptomatic but completely ambulatory  Vitals:   07/04/22 1100  BP: 112/72  Pulse: 70  Resp: 16  Temp: 98 F (36.7 C)   Filed Weights   07/04/22 1100  Weight: 225 lb (102.1 kg)    Physical Exam HENT:     Head: Normocephalic and atraumatic.     Mouth/Throat:     Pharynx: No oropharyngeal exudate.  Eyes:     Pupils: Pupils are equal, round, and reactive to light.  Cardiovascular:     Rate and Rhythm: Normal rate and regular rhythm.  Pulmonary:     Comments: Decreased breath sounds bilaterally.  No wheeze or crackles Abdominal:     General: Bowel sounds are normal. There is no distension.     Palpations: Abdomen is soft. There is no mass.     Tenderness: There is no abdominal tenderness. There is no guarding or rebound.  Musculoskeletal:        General: No tenderness. Normal range of motion.     Cervical back: Normal range of motion and neck supple.  Skin:    General: Skin is warm.  Neurological:     Mental Status: She is alert and oriented to person, place, and time.  Psychiatric:        Mood and Affect: Affect normal.      LABORATORY DATA:  I have reviewed the data as listed Lab Results  Component Value Date   WBC 8.3 07/04/2022   HGB 15.5 (H) 07/04/2022   HCT 46.1 (H) 07/04/2022   MCV 83.8 07/04/2022   PLT 211 07/04/2022   Recent Labs    02/13/22 1027 05/08/22 1144 05/10/22 0957 06/20/22 0752 07/04/22 1027  NA 142  --  136 141 139  K 3.6  --  3.4* 3.7 3.6  CL 101  --  102 111 104  CO2 25  --  27 23 27   GLUCOSE 99  --  104* 118* 97  BUN 15  --  14 13 15   CREATININE 0.71   < > 0.66 0.66 0.70  CALCIUM 10.3  --  9.2 9.8 9.7   GFRNONAA  --   --  >60 >60 >60  PROT 7.0  --  7.4  --  7.3  ALBUMIN 4.6  --  3.9  --  4.1  AST 22  --  21  --  21  ALT 17  --  16  --  15  ALKPHOS 116  --  104  --  93  BILITOT 0.6  --  0.5  --  0.8   < > = values in this interval not displayed.    RADIOGRAPHIC STUDIES: I have personally reviewed the radiological images as listed and agreed with the findings in the report. IR IMAGING GUIDED PORT INSERTION  Result Date: 06/20/2022 CLINICAL DATA:  METASTATIC LUNG CANCER, ACCESS FOR CHEMOTHERAPY EXAM: RIGHT INTERNAL JUGULAR SINGLE LUMEN POWER PORT CATHETER INSERTION Date:  06/20/2022 06/20/2022 10:30 am Radiologist:  M. Daryll Brod, MD Guidance:  Ultrasound fluoroscopic MEDICATIONS: 1% lidocaine local with epinephrine ANESTHESIA/SEDATION: Versed 2.0 mg IV; Fentanyl 100 mcg IV; Moderate Sedation Time:  21 minute The patient was continuously monitored during the procedure by the interventional radiology nurse under my direct supervision. FLUOROSCOPY: 1  minutes, 6 seconds (06.2 mGy) COMPLICATIONS: None immediate. CONTRAST:  None. PROCEDURE: Informed consent was obtained from the patient following explanation of the procedure, risks, benefits and alternatives. The patient understands, agrees and consents for the procedure. All questions were addressed. A time out was performed. Maximal barrier sterile technique utilized including caps, mask, sterile gowns, sterile gloves, large sterile drape, hand hygiene, and 2% chlorhexidine scrub. Under sterile conditions and local anesthesia, right internal jugular micropuncture venous access was performed. Access was performed with ultrasound. Images were obtained for documentation of the patent right internal jugular vein. A guide wire was inserted followed by a transitional dilator. This allowed insertion of a guide wire and catheter into the IVC. Measurements were obtained from the SVC / RA junction back to the right IJ venotomy site. In the right infraclavicular  chest, a subcutaneous pocket was created over the second anterior rib. This was done under sterile conditions and local anesthesia. 1% lidocaine with epinephrine was utilized for this. A 2.5 cm incision was made in the skin. Blunt dissection was performed to create a subcutaneous pocket over the right pectoralis major muscle. The pocket was flushed with saline vigorously. There was adequate hemostasis. The port catheter was assembled and checked for leakage. The port catheter was secured in the pocket with two retention sutures. The tubing was tunneled subcutaneously to the right venotomy site and inserted into the SVC/RA junction through a valved peel-away sheath. Position was confirmed with fluoroscopy. Images were obtained for documentation. The patient tolerated the procedure well. No immediate complications. Incisions were closed in a two layer fashion with 4 - 0 Vicryl suture. Dermabond was applied to the skin. The port catheter was accessed, blood was aspirated followed by saline and heparin flushes. Needle was removed. A dry sterile dressing was applied. IMPRESSION: Ultrasound and fluoroscopically guided right internal jugular single lumen power port catheter insertion. Tip in the SVC/RA junction. Catheter ready for use. Electronically Signed   By: Jerilynn Mages.  Shick M.D.   On: 06/20/2022 11:39   CT ABDOMINAL MASS BIOPSY  Result Date: 06/20/2022 INDICATION: Metastatic lung cancer, left adrenal metastasis EXAM: CT-GUIDED BIOPSY LEFT ADRENAL METASTASIS MEDICATIONS: 1% LIDOCAINE LOCAL ANESTHESIA/SEDATION: 0 2.0 mg IV Versed; 100 mcg IV Fentanyl Moderate Sedation Time:  20 MINUTES The patient was continuously monitored during the procedure by the interventional radiology nurse under my direct supervision. PROCEDURE: The procedure, risks, benefits, and alternatives were explained to the patient. Questions regarding the procedure were encouraged and answered. The patient understands and consents to the procedure.  previous imaging reviewed. patient positioned nearly prone. noncontrast imaging performed. left adrenal metastasis was localized and marked for a posterior lower intercostal approach. under sterile conditions and local anesthesia, a 17 gauge 11.8 cm guide was advanced to the left adrenal metastasis under CT guidance. Needle position confirmed with CT. 3 18 gauge core biopsies obtained. Samples were intact and non fragmented. These were placed in formalin. Postprocedure imaging demonstrates no hemorrhage or hematoma. Patient tolerated the procedure well without complication. Vital sign monitoring by nursing staff during the procedure will continue as patient is in the special procedures unit for post procedure observation. FINDINGS: The images document guide needle placement within the left adrenal metastasis. Post biopsy images demonstrate no immediate complicating feature. COMPLICATIONS: None immediate. IMPRESSION: Successful CT-guided core biopsy of the left adrenal metastasis RADIATION DOSE REDUCTION: This exam was performed according to the departmental dose-optimization program which includes automated exposure control, adjustment of the mA and/or kV according to patient size and/or  use of iterative reconstruction technique. Electronically Signed   By: Jerilynn Mages.  Shick M.D.   On: 06/20/2022 10:49     ASSESSMENT & PLAN:   Primary cancer of left lower lobe of lung ([place]) #JUNE 2023-STAGE IV--recurrent/metastatic disease with New left adrenal metastasis;  PET scan JUNE 20th- 2023-  Enlarged 4 cm hypermetabolic LEFT adrenal metastasis;  Suspicion of metastatic adenopathy to the LEFT hilum.  Part solid nodule in the RIGHT lower lobe without metabolic activity. Findings are suspicious for indolent adenocarcinoma.  One- PFL1 =80% [primary LUNG mass];  JULY 2023- S/p Biopsy of adrenal nodule- Biopsied POSITIVE for CK7 positive adenocarcinoma [QNS for NGS].  We will also review at the tumor conference.  #Given PD-L1  greater than 80% discussed regarding single agent Keytruda.  I discussed the mechanism of action; The goal of therapy is palliative; and length of treatments are likely ongoing/based upon the results of the scans.  Discussed that life expectancy averages in between 1 to 2 years with treatment.  Again I offered to speak to family-patient at this time declines.  #I at length discussed the potential side effects of immunotherapy including but not limited to diarrhea; skin rash; elevated LFTs/endocrine abnormalities etc. thyroid profile pending.   # Post throacotomy pain/tingling and numbness:  Continue gabapentin -300 mg TID; and then at extra at night.  STABLE.   # CAD/ PVD-STABLE.  # COPD-stable encouraged continue to avoid smoking; again counseled to quit smoking; recommend evaluation with pulmonary. STABLE.   # IV Access:s/p  port placement; emla cream.   # AM appts- Monday-  # DISPOSITION: # 8/07-  NO labs-  port- [name] # 8/28- MD; labs/port- cbc/cmp; Keytruda;  Dr..B     All questions were answered. The patient knows to call the clinic with any problems, questions or concerns.    Cammie Sickle, MD 07/04/2022 2:57 PM

## 2022-07-04 NOTE — Progress Notes (Signed)
START ON PATHWAY REGIMEN - Non-Small Cell Lung     A cycle is every 21 days:     Pembrolizumab   **Always confirm dose/schedule in your pharmacy ordering system**  Patient Characteristics: Stage IV Metastatic, Nonsquamous, Molecular Analysis Completed, Molecular Alteration Present and Targeted Therapy Exhausted OR EGFR Exon 20+ or KRAS G12C+ or HER2+ Present and No Prior Chemo/Immunotherapy OR No Alteration Present, Initial  Chemotherapy/Immunotherapy, PS = 0, 1, BRAF/MET/KRAS/HER2 Mutation Positive, Candidate for Immunotherapy, PD-L1 Expression Positive  ? 50% (TPS) and Immunotherapy Candidate Therapeutic Status: Stage IV Metastatic Histology: Nonsquamous Cell Broad Molecular Profiling Status: Engineer, manufacturing Analysis Results: KRAS G12C Mutation Present and No Prior Chemo/Immunotherapy ECOG Performance Status: 1 Chemotherapy/Immunotherapy Line of Therapy: Initial Chemotherapy/Immunotherapy Immunotherapy Candidate Status: Candidate for Immunotherapy PD-L1 Expression Status: PD-L1 Positive ? 50% (TPS) Intent of Therapy: Non-Curative / Palliative Intent, Discussed with Patient

## 2022-07-04 NOTE — Assessment & Plan Note (Addendum)
#  JUNE 2023-STAGE IV--recurrent/metastatic disease with New left adrenal metastasis;  PET scan JUNE 20th- 2023-  Enlarged 4 cm hypermetabolic LEFT adrenal metastasis;  Suspicion of metastatic adenopathy to the LEFT hilum.  Part solid nodule in the RIGHT lower lobe without metabolic activity. Findings are suspicious for indolent adenocarcinoma.  One- PFL1 =80% [primary LUNG mass];  JULY 2023- S/p Biopsy of adrenal nodule- Biopsied POSITIVE for CK7 positive adenocarcinoma [QNS for NGS].  We will also review at the tumor conference.  #Given PD-L1 greater than 80% discussed regarding single agent Keytruda.  I discussed the mechanism of action; The goal of therapy is palliative; and length of treatments are likely ongoing/based upon the results of the scans.  Discussed that life expectancy averages in between 1 to 2 years with treatment.  Again I offered to speak to family-patient at this time declines.  #I at length discussed the potential side effects of immunotherapy including but not limited to diarrhea; skin rash; elevated LFTs/endocrine abnormalities etc. thyroid profile pending.   # Post throacotomy pain/tingling and numbness:  Continue gabapentin -300 mg TID; and then at extra at night.  STABLE.   # CAD/ PVD-STABLE.  # COPD-stable encouraged continue to avoid smoking; again counseled to quit smoking; recommend evaluation with pulmonary. STABLE.   # IV Access:s/p  port placement; emla cream.   # AM appts- Monday-  # DISPOSITION: # 8/07-  NO labs-  port- [name] # 8/28- MD; labs/port- cbc/cmp; Keytruda;  Dr..B

## 2022-07-04 NOTE — Progress Notes (Signed)
Patient appetite has decreased with 5 lb wt loss.

## 2022-07-05 ENCOUNTER — Other Ambulatory Visit: Payer: Self-pay

## 2022-07-06 ENCOUNTER — Encounter: Payer: Self-pay | Admitting: Internal Medicine

## 2022-07-06 ENCOUNTER — Inpatient Hospital Stay: Payer: Medicare Other

## 2022-07-06 NOTE — Progress Notes (Signed)
Tumor Board Documentation  Laura Nielsen was presented by Dr Rogue Bussing at our Tumor Board on 07/06/2022, which included representatives from medical oncology, surgical, pharmacy, pulmonology, radiology, genetics, pathology, navigation, radiation oncology, internal medicine.  Laura Nielsen currently presents as a current patient, for [place], for new positive pathology with history of the following treatments: active survellience, surgical intervention(s).  Additionally, we reviewed previous medical and familial history, history of present illness, and recent lab results along with all available histopathologic and imaging studies. The tumor board considered available treatment options and made the following recommendations: Immunotherapy, Biopsy (Possible Hilar biopsy if patient agreeable. Refer Pulmanology)    The following procedures/referrals were also placed: No orders of the defined types were placed in this encounter.   Clinical Trial Status: not discussed   Staging used: AJCC Stage Group AJCC Staging:       Group: Stage I Adenocarcinoma of LLL Lung,  Adrenal Metastesis- unknown primary site   National site-specific guidelines NCCN were discussed with respect to the case.  Tumor board is a meeting of clinicians from various specialty areas who evaluate and discuss patients for whom a multidisciplinary approach is being considered. Final determinations in the plan of care are those of the provider(s). The responsibility for follow up of recommendations given during tumor board is that of the provider.   Today's extended care, comprehensive team conference, Laura Nielsen was not present for the discussion and was not examined.   Multidisciplinary Tumor Board is a multidisciplinary case peer review process.  Decisions discussed in the Multidisciplinary Tumor Board reflect the opinions of the specialists present at the conference without having examined the patient.  Ultimately, treatment and diagnostic  decisions rest with the primary provider(s) and the patient.

## 2022-07-07 ENCOUNTER — Encounter: Payer: Self-pay | Admitting: Internal Medicine

## 2022-07-07 NOTE — Progress Notes (Signed)
Reviewed at tumor conference imaging/pathology.  Pathology from adrenal lesion is histologically different from previous lung cancer.  However PET scan/discussion with radiology no other primary malignancy noted.  Possible left hilar/mediastinal lymph node biopsy discussed.   I reviewed the above options and the patient at length.  Patient reluctant with biopsy at this time.  Agrees to proceed with current plan with Keytruda.  How ever if not improved after 3 of 4 cycles consider biopsy at that time.

## 2022-07-10 ENCOUNTER — Inpatient Hospital Stay: Payer: Medicare Other

## 2022-07-10 VITALS — BP 125/79 | HR 75 | Temp 97.3°F | Resp 22

## 2022-07-10 DIAGNOSIS — Z5112 Encounter for antineoplastic immunotherapy: Secondary | ICD-10-CM | POA: Diagnosis not present

## 2022-07-10 DIAGNOSIS — C3432 Malignant neoplasm of lower lobe, left bronchus or lung: Secondary | ICD-10-CM

## 2022-07-10 DIAGNOSIS — F17210 Nicotine dependence, cigarettes, uncomplicated: Secondary | ICD-10-CM | POA: Diagnosis not present

## 2022-07-10 DIAGNOSIS — Z79899 Other long term (current) drug therapy: Secondary | ICD-10-CM | POA: Diagnosis not present

## 2022-07-10 DIAGNOSIS — C7972 Secondary malignant neoplasm of left adrenal gland: Secondary | ICD-10-CM | POA: Diagnosis not present

## 2022-07-10 MED ORDER — SODIUM CHLORIDE 0.9% FLUSH
10.0000 mL | INTRAVENOUS | Status: DC | PRN
  Filled 2022-07-10: qty 10

## 2022-07-10 MED ORDER — SODIUM CHLORIDE 0.9 % IV SOLN
200.0000 mg | Freq: Once | INTRAVENOUS | Status: AC
  Administered 2022-07-10: 200 mg via INTRAVENOUS
  Filled 2022-07-10: qty 8

## 2022-07-10 MED ORDER — HEPARIN SOD (PORK) LOCK FLUSH 100 UNIT/ML IV SOLN
500.0000 [IU] | Freq: Once | INTRAVENOUS | Status: AC | PRN
  Administered 2022-07-10: 500 [IU]
  Filled 2022-07-10: qty 5

## 2022-07-10 MED ORDER — SODIUM CHLORIDE 0.9 % IV SOLN
Freq: Once | INTRAVENOUS | Status: AC
  Filled 2022-07-10: qty 250

## 2022-07-10 NOTE — Patient Instructions (Signed)
[facility]  Discharge Instructions: Thank you for choosing [facility] to provide your oncology and hematology care.  If you have a lab appointment with the Cancer Center, please go directly to the Cancer Center and check in at the registration area.  Wear comfortable clothing and clothing appropriate for easy access to any Portacath or PICC line.   We strive to give you quality time with your provider. You may need to reschedule your appointment if you arrive late (15 or more minutes).  Arriving late affects you and other patients whose appointments are after yours.  Also, if you miss three or more appointments without notifying the office, you may be dismissed from the clinic at the provider's discretion.      For prescription refill requests, have your pharmacy contact our office and allow 72 hours for refills to be completed.    Today you received the following chemotherapy and/or immunotherapy agents Keytruda      To help prevent nausea and vomiting after your treatment, we encourage you to take your nausea medication as directed.  BELOW ARE SYMPTOMS THAT SHOULD BE REPORTED IMMEDIATELY: *FEVER GREATER THAN 100.4 F (38 C) OR HIGHER *CHILLS OR SWEATING *NAUSEA AND VOMITING THAT IS NOT CONTROLLED WITH YOUR NAUSEA MEDICATION *UNUSUAL SHORTNESS OF BREATH *UNUSUAL BRUISING OR BLEEDING *URINARY PROBLEMS (pain or burning when urinating, or frequent urination) *BOWEL PROBLEMS (unusual diarrhea, constipation, pain near the anus) TENDERNESS IN MOUTH AND THROAT WITH OR WITHOUT PRESENCE OF ULCERS (sore throat, sores in mouth, or a toothache) UNUSUAL RASH, SWELLING OR PAIN  UNUSUAL VAGINAL DISCHARGE OR ITCHING   Items with * indicate a potential emergency and should be followed up as soon as possible or go to the Emergency Department if any problems should occur.  Please show the CHEMOTHERAPY ALERT CARD or IMMUNOTHERAPY ALERT CARD at check-in to  the Emergency Department and triage nurse.  Should you have questions after your visit or need to cancel or reschedule your appointment, please contact [facility]  [phone] and follow the prompts.  Office hours are 8:00 a.m. to 4:30 p.m. Monday - Friday. Please note that voicemails left after 4:00 p.m. may not be returned until the following business day.  We are closed weekends and major holidays. You have access to a nurse at all times for urgent questions. Please call the main number to the clinic [phone] and follow the prompts.  For any non-urgent questions, you may also contact your provider using MyChart. We now offer e-Visits for anyone 18 and older to request care online for non-urgent symptoms. For details visit [email or web address].   Also download the MyChart app! Go to the app store, search "MyChart", open the app, select [facility], and log in with your MyChart username and password.  Masks are optional in the cancer centers. If you would like for your care team to wear a mask while they are taking care of you, please let them know. For doctor visits, patients may have with them one support person who is at least 65 years old. At this time, visitors are not allowed in the infusion area.   

## 2022-07-11 ENCOUNTER — Telehealth: Payer: Self-pay | Admitting: *Deleted

## 2022-07-11 NOTE — Telephone Encounter (Signed)
Patient called reporting that she had shaking chills last evening after she got home form her chemotherapy treatment. She states she did not have a fever or any other symptoms and she went to bed and when she awoke this morning, she was fine. She is asking if there is anything else she needs to do.  She does report that it was bad enough that she actually turned her heat on low last evening. Please advise

## 2022-07-11 NOTE — Telephone Encounter (Signed)
Discussed patient's care with Dr. Rogue Bussing.  Pt had first cycle of keytruda yesterday. No interventions needed at this time. Per md- have patient monitor symptoms for now. Take Tylenol as needed.   I attempted to reach patient. Left detailed vm for patient. Will also send mychart msg.

## 2022-07-14 ENCOUNTER — Inpatient Hospital Stay: Payer: Medicare Other

## 2022-07-14 NOTE — Progress Notes (Signed)
Nutrition Assessment:  Patient identified on Malnutrition Screening report for weight loss and poor appetite.  66 year old female with history of lung cancer now with adrenal lesion.  Biospy declined.  Patient started on keytruda.  History of lobectomy, CAD, COPD, ovarian mass.    Spoke with patient via phone.  Reports that she does not have much of an appetite.  "I can't think of anything I want."  Has eaten a slice of pizza and plum today.  Says that she can taste sweet and tart foods better. Really enjoys fruit, yogurt, cottage cheese, ice cream.  Has not tried oral nutrition supplements.  Did fix a hamburger patty.  Reports leg cramps for the last 2-3 weeks and has question about if she can use an enema while on treatment.  Finds that this works the best for her constipation.   Medications: Vit D, Vit B12, protonix, ozempic  Labs: reviewed  Anthropometrics:   Height: 68 inches Weight: 225 lb  239 lb on 12/29/21  6% weight loss in the last 6 months   NUTRITION DIAGNOSIS: Inadequate oral intake related to cancer related treatment side effects as evidenced by 6% weight loss in the last 6 months and poor po intake   INTERVENTION:  Message sent to MD and team regarding leg cramps and enema.   Encouraged small frequent mini meals/snacks Encouraged good sources of protein. Examples discussed Encouraged adding oral nutrition supplement     MONITORING, EVALUATION, GOAL: weight trends, intake   NEXT VISIT: Friday, Sept 1 phone call  Chayce Rullo B. Zenia Resides, [place], [place] Registered Dietitian [phone]

## 2022-07-18 ENCOUNTER — Telehealth: Payer: Self-pay

## 2022-07-18 NOTE — Telephone Encounter (Signed)
Patient reports episodes of leg cramps in bilateral thighs and top of feet 2-3 times a week during the night.  Also wanted to know if she can use enema while receiving Keytruda.    Has chronic constipation she has been treating with enemas every 3-4 days for the past 5 years.  Prefers enemas due the OTC medication offers no relief and the enemas offer instant relief.  Has not used an enema since starting Keytruda on 07/10/22 and has been 1 week since last bowel movement.

## 2022-07-18 NOTE — Telephone Encounter (Signed)
-----   Message from Jennet Maduro, [place] sent at 07/14/2022  2:47 PM EDT ----- Dr Jacinto Reap,  Ms Leotis Pain wanted you to know that she has been having leg cramps for the last 2-3 weeks prior to treatment (cramps along the side of her legs). Cramps happen 2-4 times at night during the week. She is wanting to know if anything she needs to do for these cramps.  She also is wanting to know if she can continue to use enemas for constipation while on keytruda?  Constipation is nothing new and she finds that enemas are the most effective.  Please touch base with her.    Thank you, Joli

## 2022-07-18 NOTE — Telephone Encounter (Addendum)
Message left for patient informing her D. B says OK to proceed her regular regimen.

## 2022-07-26 DIAGNOSIS — I2510 Atherosclerotic heart disease of native coronary artery without angina pectoris: Secondary | ICD-10-CM | POA: Diagnosis not present

## 2022-07-26 DIAGNOSIS — I714 Abdominal aortic aneurysm, without rupture, unspecified: Secondary | ICD-10-CM | POA: Diagnosis not present

## 2022-07-26 DIAGNOSIS — I10 Essential (primary) hypertension: Secondary | ICD-10-CM | POA: Diagnosis not present

## 2022-07-26 DIAGNOSIS — E782 Mixed hyperlipidemia: Secondary | ICD-10-CM | POA: Diagnosis not present

## 2022-07-26 DIAGNOSIS — J439 Emphysema, unspecified: Secondary | ICD-10-CM | POA: Diagnosis not present

## 2022-07-31 ENCOUNTER — Inpatient Hospital Stay: Payer: Medicare Other

## 2022-07-31 ENCOUNTER — Inpatient Hospital Stay (HOSPITAL_BASED_OUTPATIENT_CLINIC_OR_DEPARTMENT_OTHER): Payer: Medicare Other | Admitting: Internal Medicine

## 2022-07-31 ENCOUNTER — Encounter: Payer: Self-pay | Admitting: Internal Medicine

## 2022-07-31 ENCOUNTER — Ambulatory Visit: Payer: Medicare Other | Admitting: Family Medicine

## 2022-07-31 VITALS — BP 110/79 | HR 72 | Temp 97.2°F | Resp 18 | Ht 68.0 in | Wt 221.8 lb

## 2022-07-31 DIAGNOSIS — C3432 Malignant neoplasm of lower lobe, left bronchus or lung: Secondary | ICD-10-CM

## 2022-07-31 DIAGNOSIS — Z79899 Other long term (current) drug therapy: Secondary | ICD-10-CM | POA: Diagnosis not present

## 2022-07-31 DIAGNOSIS — N9489 Other specified conditions associated with female genital organs and menstrual cycle: Secondary | ICD-10-CM

## 2022-07-31 DIAGNOSIS — F17210 Nicotine dependence, cigarettes, uncomplicated: Secondary | ICD-10-CM | POA: Diagnosis not present

## 2022-07-31 DIAGNOSIS — C7972 Secondary malignant neoplasm of left adrenal gland: Secondary | ICD-10-CM | POA: Diagnosis not present

## 2022-07-31 DIAGNOSIS — Z5112 Encounter for antineoplastic immunotherapy: Secondary | ICD-10-CM | POA: Diagnosis not present

## 2022-07-31 LAB — COMPREHENSIVE METABOLIC PANEL
ALT: 20 U/L (ref 0–44)
AST: 26 U/L (ref 15–41)
Albumin: 4.1 g/dL (ref 3.5–5.0)
Alkaline Phosphatase: 88 U/L (ref 38–126)
Anion gap: 6 (ref 5–15)
BUN: 18 mg/dL (ref 8–23)
CO2: 25 mmol/L (ref 22–32)
Calcium: 9.4 mg/dL (ref 8.9–10.3)
Chloride: 109 mmol/L (ref 98–111)
Creatinine, Ser: 0.62 mg/dL (ref 0.44–1.00)
GFR, Estimated: >60 mL/min (ref >60)
Glucose, Bld: 108 mg/dL — ABNORMAL HIGH (ref 70–99)
Potassium: 3.5 mmol/L (ref 3.5–5.1)
Sodium: 140 mmol/L (ref 135–145)
Total Bilirubin: 0.6 mg/dL (ref 0.3–1.2)
Total Protein: 7.4 g/dL (ref 6.5–8.1)

## 2022-07-31 LAB — CBC WITH DIFFERENTIAL/PLATELET
Abs Immature Granulocytes: 0.02 10*3/uL (ref 0.00–0.07)
Basophils Absolute: 0 10*3/uL (ref 0.0–0.1)
Basophils Relative: 1 %
Eosinophils Absolute: 0.3 10*3/uL (ref 0.0–0.5)
Eosinophils Relative: 4 %
HCT: 47 % — ABNORMAL HIGH (ref 36.0–46.0)
Hemoglobin: 15.7 g/dL — ABNORMAL HIGH (ref 12.0–15.0)
Immature Granulocytes: 0 %
Lymphocytes Relative: 39 %
Lymphs Abs: 3 10*3/uL (ref 0.7–4.0)
MCH: 28 pg (ref 26.0–34.0)
MCHC: 33.4 g/dL (ref 30.0–36.0)
MCV: 83.9 fL (ref 80.0–100.0)
Monocytes Absolute: 0.5 10*3/uL (ref 0.1–1.0)
Monocytes Relative: 7 %
Neutro Abs: 3.9 10*3/uL (ref 1.7–7.7)
Neutrophils Relative %: 49 %
Platelets: 198 10*3/uL (ref 150–400)
RBC: 5.6 MIL/uL — ABNORMAL HIGH (ref 3.87–5.11)
RDW: 14.1 % (ref 11.5–15.5)
WBC: 7.7 10*3/uL (ref 4.0–10.5)
nRBC: 0 % (ref 0.0–0.2)

## 2022-07-31 MED ORDER — SODIUM CHLORIDE 0.9 % IV SOLN
200.0000 mg | Freq: Once | INTRAVENOUS | Status: AC
  Administered 2022-07-31: 200 mg via INTRAVENOUS
  Filled 2022-07-31: qty 8

## 2022-07-31 MED ORDER — SODIUM CHLORIDE 0.9% FLUSH
10.0000 mL | INTRAVENOUS | Status: DC | PRN
  Filled 2022-07-31: qty 10

## 2022-07-31 MED ORDER — HEPARIN SOD (PORK) LOCK FLUSH 100 UNIT/ML IV SOLN
500.0000 [IU] | Freq: Once | INTRAVENOUS | Status: AC | PRN
  Administered 2022-07-31: 500 [IU]
  Filled 2022-07-31: qty 5

## 2022-07-31 MED ORDER — SODIUM CHLORIDE 0.9 % IV SOLN
Freq: Once | INTRAVENOUS | Status: AC
  Filled 2022-07-31: qty 250

## 2022-07-31 NOTE — Assessment & Plan Note (Addendum)
#  JUNE 2023-STAGE IV--recurrent/metastatic disease with New left adrenal metastasis;  PET scan JUNE 20th- 2023-  Enlarged 4 cm hypermetabolic LEFT adrenal metastasis;  Suspicion of metastatic adenopathy to the LEFT hilum.  Part solid nodule in the RIGHT lower lobe without metabolic activity. Findings are suspicious for indolent adenocarcinoma.  One- PFL1 =80% [primary LUNG mass];  JULY 2023- S/p Biopsy of adrenal nodule- Biopsied POSITIVE for CK7 positive adenocarcinoma [QNS for NGS].  Also reviewed at the tumor conference.  Currently on single agent Keytruda [PD-L1 greater than 80% ]  #Proceed with cycle #2 of Keytruda single agent. Labs today reviewed;  acceptable for treatment today. Tolearting well except for mild chills.   # Mild chills- post infusion recommend benadryl/tylenol prn.   # Post throacotomy pain/tingling and numbness:  Continue gabapentin -300 mg TID; and then at extra at night.  STABLE.   #Mild rash on the face-question Keytruda versus others.  Recommend hydrocortisone cream over-the-counter.  # CAD/ PVD- cramping- recommend evaluation with vascular-n  # COPD-stable encouraged continue to avoid smoking; again counseled to quit smoking; recommend evaluation with pulmonary. STABLE.   # IV Access :s/p  port placement- STABLE.    *AM appts- Monday-   # DISPOSITION: # keytruda today #Follow-up in 3 weeks-MD labs/port- cbc/cmp; Keytruda;  Dr..B

## 2022-07-31 NOTE — Progress Notes (Signed)
[facility] NOTE  Patient Care Team: Birdie Sons, MD as PCP - General (Family Medicine) Ubaldo Glassing Javier Docker, MD as Consulting Physician (Cardiology) Schnier, Dolores Lory, MD (Vascular Surgery) Vladimir Crofts, MD as Consulting Physician (Neurology) Milinda Pointer, MD as Referring Physician (Pain Medicine) [place], [place] (Optometry) Ubaldo Glassing, Javier Docker, MD as Consulting Physician (Cardiology) Virgel Manifold, MD (Inactive) as Consulting Physician (Gastroenterology) Gae Dry, MD as Referring Physician (Obstetrics and Gynecology) Telford Nab, RN as Oncology Nurse Navigator Cammie Sickle, MD as Consulting Physician (Oncology)   CHIEF COMPLAINTS/PURPOSE OF CONSULTATION: lung cancer  #  Oncology History Overview Note  #MAY 2022- LLL nodule 2.3 cm Adenocarcinoma Stage IA; s/p lobectomy.  No adjuvant therapy  # CT scan 4th June 2023-highly concerning for recurrent/metastatic disease with-. New left adrenal metastasis;  Ground-glass and part solid nodules in the peripheral right lower lobe, unchanged from 11/07/2021 but slightly enlarged from 01/01/2018. Findings are suspicious for indolent adenocarcinoma.  F One- PFL1 =80%; KRAS G12C PET scan JUNE 20th- 2023-  Enlarged 4 cm hypermetabolic LEFT adrenal metastasis;  Suspicion of metastatic adenopathy to the LEFT hilum.  Part solid nodule in the RIGHT lower lobe without metabolic activity.   3 ADRENAL BIOPSY- CK7 POSITIVE ADENOCARCINOMA- There is limited tissue remaining for ancillary testing, not likely  sufficient for NGS testing.   # AUG 7th, 2023-single agent Keytruda.    Primary cancer of left lower lobe of lung ([place])  05/09/2021 Initial Diagnosis   Primary cancer of left lower lobe of lung ([place])   07/04/2022 -  Chemotherapy   Patient is on Treatment Plan : LUNG NSCLC Pembrolizumab (200) q21d     07/10/2022 - 07/10/2022 Chemotherapy   Patient is on Treatment Plan : LUNG NSCLC Pembrolizumab  (200) q21d        HISTORY OF PRESENTING ILLNESS: Alone.  Ambulating independently.  Laura Nielsen 66 y.o.  female history of smoking; prior history of lung cancer-with likely recurrence to left adrenal gland [s/p Bx CK-7 positive TTF-1 negative]-clinically suggestive of recurrent lung cancer on single agent [name] is here for follow-up.  Patient noted to have mild chills after her first infusion.  Otherwise patient continues to have mild shortness of breath on exertion.  Denies any headaches.  Denies any nausea vomiting.  Mild rash on the face.  Non-itchy.  No rash anywhere else.  Complains of cramping in the legs chronic.   Review of Systems  Constitutional:  Negative for chills, diaphoresis, fever, malaise/fatigue and weight loss.  HENT:  Negative for nosebleeds and sore throat.   Eyes:  Negative for double vision.  Respiratory:  Negative for cough, hemoptysis, sputum production, shortness of breath and wheezing.   Cardiovascular:  Positive for chest pain. Negative for palpitations, orthopnea and leg swelling.  Gastrointestinal:  Negative for abdominal pain, blood in stool, constipation, diarrhea, heartburn, melena, nausea and vomiting.  Genitourinary:  Negative for dysuria, frequency and urgency.  Musculoskeletal:  Positive for back pain and joint pain.  Skin: Negative.  Negative for itching and rash.  Neurological:  Positive for tingling. Negative for dizziness, focal weakness, weakness and headaches.  Endo/Heme/Allergies:  Does not bruise/bleed easily.  Psychiatric/Behavioral:  Negative for depression. The patient is not nervous/anxious and does not have insomnia.      MEDICAL HISTORY:  Past Medical History:  Diagnosis Date   AAA (abdominal aortic aneurysm) ([place])    s/p EVAR AAA 03/27/13   Arthritis    Asthma  Complication of anesthesia    BP drops after   History of kidney stones    Osteoporosis    Sleep apnea     SURGICAL HISTORY: Past Surgical History:   Procedure Laterality Date   ABDOMINAL AORTIC ENDOVASCULAR STENT GRAFT  03/27/2013   Dr. Hortencia Pilar   Cardiac catheterization  02/2009   70-80% stenosis RCA stent placed. started on Plavix   CARDIAC CATHETERIZATION     COLONOSCOPY WITH PROPOFOL N/A 01/05/2021   Procedure: COLONOSCOPY WITH PROPOFOL;  Surgeon: Virgel Manifold, MD;  Location: [facility];  Service: Endoscopy;  Laterality: N/A;   CORONARY ANGIOPLASTY     stent placed   ESOPHAGOGASTRODUODENOSCOPY (EGD) WITH PROPOFOL N/A 01/05/2021   Procedure: ESOPHAGOGASTRODUODENOSCOPY (EGD) WITH PROPOFOL;  Surgeon: Virgel Manifold, MD;  Location: [facility];  Service: Endoscopy;  Laterality: N/A;   INTERCOSTAL NERVE BLOCK Left 04/06/2021   Procedure: INTERCOSTAL NERVE BLOCK;  Surgeon: Melrose Nakayama, MD;  Location: [place];  Service: Thoracic;  Laterality: Left;   IR IMAGING GUIDED PORT INSERTION  06/20/2022   KIDNEY STONE SURGERY  1999   LUNG LOBECTOMY Left 04/06/2021   robotic left lower lobectomy 04/06/2021 Dr. Roxan Hockey for Stage 1A adenocarcinoma   NODE DISSECTION Left 04/06/2021   Procedure: NODE DISSECTION;  Surgeon: Melrose Nakayama, MD;  Location: [place];  Service: Thoracic;  Laterality: Left;   TUBAL LIGATION     VAGINAL HYSTERECTOMY     Menometrorrhagia. Excessive bleeding. Unknown if cervix removed.     SOCIAL HISTORY: Social History   Socioeconomic History   Marital status: Divorced    Spouse name: Not on file   Number of children: 2   Years of education: H/S   Highest education level: High school graduate  Occupational History   Occupation: Disabled   Occupation: retired  Tobacco Use   Smoking status: Former    Packs/day: 0.25    Years: 44.50    Total pack years: 11.13    Types: Cigarettes    Quit date: 04/05/2021    Years since quitting: 1.3   Smokeless tobacco: Never   Tobacco comments:    12/23/19 states she quit for 3 months and then started back. Pt is going to talk with MD  about this.  Vaping Use   Vaping Use: Never used  Substance and Sexual Activity   Alcohol use: Yes    Comment: rarely - once a year   Drug use: No   Sexual activity: Not on file  Other Topics Concern   Not on file  Social History Narrative   Hx of smoking; quit prior to lung surgery. Lives in [place]- self. No alcohol. 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 On disability sec to spinal pain.    Social Determinants of Health   Financial Resource Strain: Low Risk  (02/10/2021)   Overall Financial Resource Strain (CARDIA)    Difficulty of Paying Living Expenses: Not hard at all  Food Insecurity: No Food Insecurity (03/21/2022)   Hunger Vital Sign    Worried About Running Out of Food in the Last Year: Never true    Ran Out of Food in the Last Year: Never true  Transportation Needs: No Transportation Needs (03/21/2022)   PRAPARE - Hydrologist (Medical): No    Lack of Transportation (Non-Medical): No  Physical Activity: Inactive (02/10/2021)   Exercise Vital Sign    Days of Exercise per Week: 0 days    Minutes of Exercise per  Session: 0 min  Stress: Stress Concern Present (02/10/2021)   [place]    Feeling of Stress : To some extent  Social Connections: Socially Isolated (02/10/2021)   Social Connection and Isolation Panel [NHANES]    Frequency of Communication with Friends and Family: More than three times a week    Frequency of Social Gatherings with Friends and Family: Once a week    Attends Religious Services: Never    Marine scientist or Organizations: No    Attends Archivist Meetings: Never    Marital Status: Divorced  Human resources officer Violence: Not At Risk (02/10/2021)   Humiliation, Afraid, Rape, and Kick questionnaire    Fear of Current or Ex-Partner: No    Emotionally Abused: No    Physically Abused: No    Sexually Abused: No    FAMILY HISTORY: Family  History  Problem Relation Age of Onset   Hypertension Mother    Coronary artery disease Mother    Heart attack Mother        acute   Cancer Mother    Alcohol abuse Father    Depression Father    Hypertension Father    Heart attack Father 46       acute   Alcohol abuse Sister    Hyperlipidemia Sister    Hypertension Sister    Cancer Sister 5   Heart attack Sister        x's 2   Coronary artery disease Sister 48       x's 2   Breast cancer Sister 70    ALLERGIES:  is allergic to atorvastatin, omeprazole, and bupropion.  MEDICATIONS:  Current Outpatient Medications  Medication Sig Dispense Refill   allopurinol (ZYLOPRIM) 100 MG tablet Take 1 tablet (100 mg total) by mouth daily. 90 tablet 4   aspirin 81 MG tablet Take 81 mg by mouth daily.      bisoprolol (ZEBETA) 10 MG tablet Take 1 tablet (10 mg total) by mouth daily. 90 tablet 4   Cholecalciferol 25 MCG (1000 UT) capsule Take 1,000 Units by mouth daily.     clopidogrel (PLAVIX) 75 MG tablet Take 1 tablet (75 mg total) by mouth daily. 90 tablet 1   cyanocobalamin 1000 MCG tablet Take 1,000 mcg by mouth daily.     fluticasone (FLONASE) 50 MCG/ACT nasal spray Place into both nostrils daily.     gabapentin (NEURONTIN) 300 MG capsule Take 1 capsule (300 mg total) by mouth 3 (three) times daily AND 2 capsules (600 mg total) at bedtime.     ibuprofen (ADVIL) 200 MG tablet Take 400 mg by mouth 2 (two) times daily as needed (headaches).     lidocaine-prilocaine (EMLA) cream Apply on the port. 30 -45 min  prior to port access. 30 g 3   lisinopril-hydrochlorothiazide (ZESTORETIC) 20-12.5 MG tablet Take 1 tablet by mouth daily. 90 tablet 4   montelukast (SINGULAIR) 10 MG tablet Take 1 tablet (10 mg total) by mouth at bedtime. 30 tablet 3   pantoprazole (PROTONIX) 20 MG tablet Take 1 tablet (20 mg total) by mouth daily. 90 tablet 4   PROAIR HFA 108 (90 Base) MCG/ACT inhaler Inhale 2 puffs into the lungs every 6 (six) hours as needed for  wheezing or shortness of breath. 8.5 g 4   rosuvastatin (CRESTOR) 20 MG tablet Take 1 tablet (20 mg total) by mouth daily. 90 tablet 4   Semaglutide, 1 MG/DOSE, (OZEMPIC, 1  MG/DOSE,) 4 MG/3ML SOPN Inject 1 mg into the skin once a week. 3 mL 2   azelastine (ASTELIN) 0.1 % nasal spray Place 2 sprays into both nostrils 2 (two) times daily. Use in each nostril as directed 30 mL 2   buPROPion ER (WELLBUTRIN SR) 100 MG 12 hr tablet Take 1 tablet (100 mg total) by mouth 2 (two) times daily. 60 tablet 1   No current facility-administered medications for this visit.    Mild maculopapular rash on the [place] area left more than right.  PHYSICAL EXAMINATION: ECOG PERFORMANCE STATUS: 1 - Symptomatic but completely ambulatory  Vitals:   07/31/22 0800  BP: 110/79  Pulse: 72  Resp: 18  Temp: (!) 97.2 F (36.2 C)  SpO2: 97%   Filed Weights   07/31/22 0800  Weight: 221 lb 12.8 oz (100.6 kg)    Physical Exam HENT:     Head: Normocephalic and atraumatic.     Mouth/Throat:     Pharynx: No oropharyngeal exudate.  Eyes:     Pupils: Pupils are equal, round, and reactive to light.  Cardiovascular:     Rate and Rhythm: Normal rate and regular rhythm.  Pulmonary:     Comments: Decreased breath sounds bilaterally.  No wheeze or crackles Abdominal:     General: Bowel sounds are normal. There is no distension.     Palpations: Abdomen is soft. There is no mass.     Tenderness: There is no abdominal tenderness. There is no guarding or rebound.  Musculoskeletal:        General: No tenderness. Normal range of motion.     Cervical back: Normal range of motion and neck supple.  Skin:    General: Skin is warm.  Neurological:     Mental Status: She is alert and oriented to person, place, and time.  Psychiatric:        Mood and Affect: Affect normal.      LABORATORY DATA:  I have reviewed the data as listed Lab Results  Component Value Date   WBC 7.7 07/31/2022   HGB 15.7 (H) 07/31/2022   HCT  47.0 (H) 07/31/2022   MCV 83.9 07/31/2022   PLT 198 07/31/2022   Recent Labs    05/10/22 0957 06/20/22 0752 07/04/22 1027 07/31/22 0756  NA 136 141 139 140  K 3.4* 3.7 3.6 3.5  CL 102 111 104 109  CO2 27 23 27 25   GLUCOSE 104* 118* 97 108*  BUN 14 13 15 18   CREATININE 0.66 0.66 0.70 0.62  CALCIUM 9.2 9.8 9.7 9.4  GFRNONAA >60 >60 >60 >60  PROT 7.4  --  7.3 7.4  ALBUMIN 3.9  --  4.1 4.1  AST 21  --  21 26  ALT 16  --  15 20  ALKPHOS 104  --  93 88  BILITOT 0.5  --  0.8 0.6    RADIOGRAPHIC STUDIES: I have personally reviewed the radiological images as listed and agreed with the findings in the report. No results found.   ASSESSMENT & PLAN:   Primary cancer of left lower lobe of lung ([place]) #JUNE 2023-STAGE IV--recurrent/metastatic disease with New left adrenal metastasis;  PET scan JUNE 20th- 2023-  Enlarged 4 cm hypermetabolic LEFT adrenal metastasis;  Suspicion of metastatic adenopathy to the LEFT hilum.  Part solid nodule in the RIGHT lower lobe without metabolic activity. Findings are suspicious for indolent adenocarcinoma.  One- PFL1 =80% [primary LUNG mass];  JULY 2023- S/p Biopsy of adrenal  nodule- Biopsied POSITIVE for CK7 positive adenocarcinoma [QNS for NGS].  Also reviewed at the tumor conference.  Currently on single agent Keytruda [PD-L1 greater than 80% ]  #Proceed with cycle #2 of Keytruda single agent. Labs today reviewed;  acceptable for treatment today. Tolearting well except for mild chills.   # Mild chills- post infusion recommend benadryl/tylenol prn.   # Post throacotomy pain/tingling and numbness:  Continue gabapentin -300 mg TID; and then at extra at night.  STABLE.   #Mild rash on the face-question Keytruda versus others.  Recommend hydrocortisone cream over-the-counter.  # CAD/ PVD- cramping- recommend evaluation with vascular-n  # COPD-stable encouraged continue to avoid smoking; again counseled to quit smoking; recommend evaluation with  pulmonary. STABLE.   # IV Access :s/p  port placement- STABLE.    *AM appts- Monday-   # DISPOSITION: # keytruda today #Follow-up in 3 weeks-MD labs/port- cbc/cmp; Keytruda;  Dr..B     All questions were answered. The patient knows to call the clinic with any problems, questions or concerns.    Cammie Sickle, MD 07/31/2022 5:36 PM

## 2022-07-31 NOTE — Patient Instructions (Signed)
[facility]  Discharge Instructions: Thank you for choosing [facility] to provide your oncology and hematology care.  If you have a lab appointment with the Cancer Center, please go directly to the Cancer Center and check in at the registration area.  Wear comfortable clothing and clothing appropriate for easy access to any Portacath or PICC line.   We strive to give you quality time with your provider. You may need to reschedule your appointment if you arrive late (15 or more minutes).  Arriving late affects you and other patients whose appointments are after yours.  Also, if you miss three or more appointments without notifying the office, you may be dismissed from the clinic at the provider's discretion.      For prescription refill requests, have your pharmacy contact our office and allow 72 hours for refills to be completed.    Today you received the following chemotherapy and/or immunotherapy agents Keytruda      To help prevent nausea and vomiting after your treatment, we encourage you to take your nausea medication as directed.  BELOW ARE SYMPTOMS THAT SHOULD BE REPORTED IMMEDIATELY: *FEVER GREATER THAN 100.4 F (38 C) OR HIGHER *CHILLS OR SWEATING *NAUSEA AND VOMITING THAT IS NOT CONTROLLED WITH YOUR NAUSEA MEDICATION *UNUSUAL SHORTNESS OF BREATH *UNUSUAL BRUISING OR BLEEDING *URINARY PROBLEMS (pain or burning when urinating, or frequent urination) *BOWEL PROBLEMS (unusual diarrhea, constipation, pain near the anus) TENDERNESS IN MOUTH AND THROAT WITH OR WITHOUT PRESENCE OF ULCERS (sore throat, sores in mouth, or a toothache) UNUSUAL RASH, SWELLING OR PAIN  UNUSUAL VAGINAL DISCHARGE OR ITCHING   Items with * indicate a potential emergency and should be followed up as soon as possible or go to the Emergency Department if any problems should occur.  Please show the CHEMOTHERAPY ALERT CARD or IMMUNOTHERAPY ALERT CARD at check-in to  the Emergency Department and triage nurse.  Should you have questions after your visit or need to cancel or reschedule your appointment, please contact [facility]  [phone] and follow the prompts.  Office hours are 8:00 a.m. to 4:30 p.m. Monday - Friday. Please note that voicemails left after 4:00 p.m. may not be returned until the following business day.  We are closed weekends and major holidays. You have access to a nurse at all times for urgent questions. Please call the main number to the clinic [phone] and follow the prompts.  For any non-urgent questions, you may also contact your provider using MyChart. We now offer e-Visits for anyone 18 and older to request care online for non-urgent symptoms. For details visit [email or web address].   Also download the MyChart app! Go to the app store, search "MyChart", open the app, select [facility], and log in with your MyChart username and password.  Masks are optional in the cancer centers. If you would like for your care team to wear a mask while they are taking care of you, please let them know. For doctor visits, patients may have with them one support person who is at least 65 years old. At this time, visitors are not allowed in the infusion area.   

## 2022-07-31 NOTE — Patient Instructions (Signed)
Hydrocortisone over-the-counter ointment on the skin rash twice a day.

## 2022-07-31 NOTE — Progress Notes (Signed)
Patient has a decrease in appetite but is using Ozempic.  Has a rash on face.

## 2022-08-01 ENCOUNTER — Other Ambulatory Visit: Payer: Self-pay

## 2022-08-01 LAB — CA 125: Cancer Antigen (CA) 125: 13.9 U/mL (ref 0.0–38.1)

## 2022-08-04 ENCOUNTER — Other Ambulatory Visit: Payer: Self-pay | Admitting: Family Medicine

## 2022-08-04 ENCOUNTER — Inpatient Hospital Stay: Payer: Medicare Other | Attending: Internal Medicine

## 2022-08-04 ENCOUNTER — Other Ambulatory Visit: Payer: Self-pay

## 2022-08-04 DIAGNOSIS — C7972 Secondary malignant neoplasm of left adrenal gland: Secondary | ICD-10-CM | POA: Insufficient documentation

## 2022-08-04 DIAGNOSIS — C3432 Malignant neoplasm of lower lobe, left bronchus or lung: Secondary | ICD-10-CM | POA: Insufficient documentation

## 2022-08-04 DIAGNOSIS — Z5112 Encounter for antineoplastic immunotherapy: Secondary | ICD-10-CM | POA: Insufficient documentation

## 2022-08-04 DIAGNOSIS — Z79899 Other long term (current) drug therapy: Secondary | ICD-10-CM | POA: Insufficient documentation

## 2022-08-04 NOTE — Progress Notes (Signed)
Nutrition Follow-up:  Patient with lung cancer and adrenal lesion.  Patient receiving [place].    Spoke with patient via phone.  Appetite still about the same.  Today has eaten 3/4 of 6 inch steak and cheese sub and some fries.  Has not tried oral nutrition supplements.  Still likes fruit, yogurt and cottage cheese.     Labs: glucose 108  Anthropometrics:   Weight 221 lb 12.8 oz on 8/28  225 lb on 8/11 239 lb on 12/29/21  Patient verbalized that she wants to lose weight  NUTRITION DIAGNOSIS: Inadequate oral intake ongoing   INTERVENTION:  Encouraged good sources of protein and well balanced diet.      MONITORING, EVALUATION, GOAL: weight trends, intake   NEXT VISIT: Friday, Oct 13 phone call  Delmar Dondero B. Zenia Resides, [place], [place] Registered Dietitian [phone]

## 2022-08-05 ENCOUNTER — Other Ambulatory Visit: Payer: Self-pay

## 2022-08-09 ENCOUNTER — Other Ambulatory Visit: Payer: Medicare Other

## 2022-08-14 ENCOUNTER — Ambulatory Visit (INDEPENDENT_AMBULATORY_CARE_PROVIDER_SITE_OTHER): Payer: Medicare Other | Admitting: Family Medicine

## 2022-08-14 VITALS — BP 114/9 | HR 74 | Temp 98.1°F | Resp 18 | Wt 222.0 lb

## 2022-08-14 DIAGNOSIS — H538 Other visual disturbances: Secondary | ICD-10-CM

## 2022-08-14 DIAGNOSIS — I2510 Atherosclerotic heart disease of native coronary artery without angina pectoris: Secondary | ICD-10-CM

## 2022-08-14 DIAGNOSIS — I700 Atherosclerosis of aorta: Secondary | ICD-10-CM

## 2022-08-14 DIAGNOSIS — J449 Chronic obstructive pulmonary disease, unspecified: Secondary | ICD-10-CM | POA: Diagnosis not present

## 2022-08-14 DIAGNOSIS — I10 Essential (primary) hypertension: Secondary | ICD-10-CM | POA: Diagnosis not present

## 2022-08-14 DIAGNOSIS — E119 Type 2 diabetes mellitus without complications: Secondary | ICD-10-CM

## 2022-08-14 LAB — POCT GLYCOSYLATED HEMOGLOBIN (HGB A1C)
Est. average glucose Bld gHb Est-mCnc: 117
Hemoglobin A1C: 5.7 % — AB (ref 4.0–5.6)

## 2022-08-14 NOTE — Patient Instructions (Addendum)
Please review the attached list of medications and notify my office if there are any errors.   Please contact your eyecare professional to schedule a routine eye exam. I recommend seeing Dr. Jomarie Longs at [phone] or the [facility] at [phone]

## 2022-08-14 NOTE — Progress Notes (Signed)
[place] P[place],acting as a scribe for Lelon Huh, MD.,have documented all relevant documentation on the behalf of Lelon Huh, MD,as directed by  Lelon Huh, MD while in the presence of Lelon Huh, MD.     Established patient visit   Patient: Laura Nielsen   DOB: 05-29-1956   66 y.o. Female  MRN: [id number] Visit Date: 08/14/2022  Today's healthcare provider: Lelon Huh, MD   Chief Complaint  Patient presents with   Hyperlipidemia   Diabetes   Hypertension   Subjective    HPI  Diabetes Mellitus Type II, Follow-up  Lab Results  Component Value Date   HGBA1C 5.8 (H) 02/13/2022   HGBA1C 6.1 (H) 07/12/2021   HGBA1C 5.9 (H) 04/04/2021   Wt Readings from Last 3 Encounters:  08/14/22 222 lb (100.7 kg)  07/31/22 221 lb 12.8 oz (100.6 kg)  07/04/22 225 lb (102.1 kg)   Last seen for diabetes 6 months ago.  Management since then includes none. She reports good compliance with treatment. She is not having side effects.  Symptoms: No fatigue No foot ulcerations  No appetite changes No nausea  No paresthesia of the feet  No polydipsia  No polyuria Yes visual disturbances   No vomiting     Home blood sugar records:  not being checked  Episodes of hypoglycemia? No    Current insulin regiment: none Most Recent Eye Exam: patient is due Current exercise: none Current diet habits: no dietary restrictions  Pertinent Labs: Lab Results  Component Value Date   CHOL 144 02/13/2022   HDL 41 02/13/2022   LDLCALC 76 02/13/2022   TRIG 153 (H) 02/13/2022   CHOLHDL 3.5 02/13/2022   Lab Results  Component Value Date   NA 140 07/31/2022   K 3.5 07/31/2022   CREATININE 0.62 07/31/2022   GFRNONAA >60 07/31/2022   MICROALBUR 20 12/12/2017     ---------------------------------------------------------------------------------------------------   Medications: Outpatient Medications Prior to Visit  Medication Sig   allopurinol (ZYLOPRIM) 100 MG tablet Take 1  tablet (100 mg total) by mouth daily.   aspirin 81 MG tablet Take 81 mg by mouth daily.    azelastine (ASTELIN) 0.1 % nasal spray Place 2 sprays into both nostrils 2 (two) times daily. Use in each nostril as directed   bisoprolol (ZEBETA) 10 MG tablet Take 1 tablet (10 mg total) by mouth daily.   Cholecalciferol 25 MCG (1000 UT) capsule Take 1,000 Units by mouth daily.   clopidogrel (PLAVIX) 75 MG tablet Take 1 tablet (75 mg total) by mouth daily.   cyanocobalamin 1000 MCG tablet Take 1,000 mcg by mouth daily.   fluticasone (FLONASE) 50 MCG/ACT nasal spray Place into both nostrils daily.   gabapentin (NEURONTIN) 300 MG capsule Take 1 capsule (300 mg total) by mouth 3 (three) times daily AND 2 capsules (600 mg total) at bedtime.   ibuprofen (ADVIL) 200 MG tablet Take 400 mg by mouth 2 (two) times daily as needed (headaches).   lidocaine-prilocaine (EMLA) cream Apply on the port. 30 -45 min  prior to port access.   lisinopril-hydrochlorothiazide (ZESTORETIC) 20-12.5 MG tablet Take 1 tablet by mouth daily.   montelukast (SINGULAIR) 10 MG tablet Take 1 tablet (10 mg total) by mouth at bedtime.   pantoprazole (PROTONIX) 20 MG tablet Take 1 tablet (20 mg total) by mouth daily.   PROAIR HFA 108 (90 Base) MCG/ACT inhaler Inhale 2 puffs into the lungs every 6 (six) hours as needed for wheezing or shortness of breath.  rosuvastatin (CRESTOR) 20 MG tablet Take 1 tablet (20 mg total) by mouth daily.   Semaglutide, 1 MG/DOSE, (OZEMPIC, 1 MG/DOSE,) 4 MG/3ML SOPN Inject 1 mg into the skin once a week.   buPROPion ER (WELLBUTRIN SR) 100 MG 12 hr tablet Take 1 tablet (100 mg total) by mouth 2 (two) times daily.   No facility-administered medications prior to visit.    Review of Systems  Constitutional:  Negative for appetite change, chills, fatigue and fever.  Respiratory:  Negative for chest tightness and shortness of breath.   Cardiovascular:  Negative for chest pain and palpitations.  Gastrointestinal:   Negative for abdominal pain, nausea and vomiting.  Neurological:  Negative for dizziness and weakness.       Objective    BP (!) 114/9 (BP Location: Left Arm, Patient Position: Sitting, Cuff Size: Normal)   Pulse 74   Temp 98.1 F (36.7 C) (Oral)   Resp 18   Wt 222 lb (100.7 kg)   BMI 33.75 kg/m    Physical Exam  General appearance: Obese female, cooperative and in no acute distress Head: Normocephalic, without obvious abnormality, atraumatic Respiratory: Respirations even and unlabored, normal respiratory rate Extremities: All extremities are intact.  Skin: Skin color, texture, turgor normal. No rashes seen  Psych: Appropriate mood and affect. Neurologic: Mental status: Alert, oriented to person, place, and time, thought content appropriate.   Results for orders placed or performed in visit on 08/14/22  POCT glycosylated hemoglobin (Hb A1C)  Result Value Ref Range   Hemoglobin A1C 5.7 (A) 4.0 - 5.6 %   Est. average glucose Bld gHb Est-mCnc 117     Assessment & Plan     1. Type 2 diabetes mellitus without complication, without long-term current use of insulin (HCC) Very well controlled. Continue semaglutide.  - Microalbumin / creatinine urine ratio - Ambulatory referral to Ophthalmology  2. Primary hypertension Well controlled. Continue current medications.    3. Chronic obstructive pulmonary disease, unspecified COPD type ([place]) Well compensated. No longer smoking. Continue annual LDCT  4. Aortic atherosclerosis (HCC) Asymptomatic. Compliant with medication.  Continue aggressive risk factor modification.    5. Blurry vision  - Ambulatory referral to Ophthalmology   She declined flu vaccine.      The entirety of the information documented in the History of Present Illness, Review of Systems and Physical Exam were personally obtained by me. Portions of this information were initially documented by the CMA and reviewed by me for thoroughness and accuracy.      Lelon Huh, MD  [facility] [phone] (phone) [phone] (fax)  [place]

## 2022-08-15 ENCOUNTER — Other Ambulatory Visit: Payer: Self-pay

## 2022-08-16 ENCOUNTER — Other Ambulatory Visit: Payer: Medicare Other

## 2022-08-16 ENCOUNTER — Ambulatory Visit: Payer: Medicare Other

## 2022-08-21 ENCOUNTER — Inpatient Hospital Stay: Payer: Medicare Other

## 2022-08-21 ENCOUNTER — Ambulatory Visit: Payer: Medicare Other | Admitting: Internal Medicine

## 2022-08-21 ENCOUNTER — Inpatient Hospital Stay (HOSPITAL_BASED_OUTPATIENT_CLINIC_OR_DEPARTMENT_OTHER): Payer: Medicare Other | Admitting: Medical Oncology

## 2022-08-21 ENCOUNTER — Other Ambulatory Visit: Payer: Medicare Other

## 2022-08-21 ENCOUNTER — Ambulatory Visit: Payer: Medicare Other

## 2022-08-21 ENCOUNTER — Encounter: Payer: Self-pay | Admitting: Medical Oncology

## 2022-08-21 VITALS — BP 128/92 | HR 69 | Temp 96.1°F | Ht 68.0 in | Wt 222.0 lb

## 2022-08-21 DIAGNOSIS — E278 Other specified disorders of adrenal gland: Secondary | ICD-10-CM

## 2022-08-21 DIAGNOSIS — C3432 Malignant neoplasm of lower lobe, left bronchus or lung: Secondary | ICD-10-CM

## 2022-08-21 DIAGNOSIS — C7972 Secondary malignant neoplasm of left adrenal gland: Secondary | ICD-10-CM | POA: Diagnosis not present

## 2022-08-21 DIAGNOSIS — Z87891 Personal history of nicotine dependence: Secondary | ICD-10-CM

## 2022-08-21 DIAGNOSIS — Z79899 Other long term (current) drug therapy: Secondary | ICD-10-CM | POA: Diagnosis not present

## 2022-08-21 DIAGNOSIS — Z5112 Encounter for antineoplastic immunotherapy: Secondary | ICD-10-CM | POA: Diagnosis not present

## 2022-08-21 LAB — COMPREHENSIVE METABOLIC PANEL
ALT: 17 U/L (ref 0–44)
AST: 23 U/L (ref 15–41)
Albumin: 4.2 g/dL (ref 3.5–5.0)
Alkaline Phosphatase: 89 U/L (ref 38–126)
Anion gap: 7 (ref 5–15)
BUN: 18 mg/dL (ref 8–23)
CO2: 27 mmol/L (ref 22–32)
Calcium: 9.9 mg/dL (ref 8.9–10.3)
Chloride: 105 mmol/L (ref 98–111)
Creatinine, Ser: 0.7 mg/dL (ref 0.44–1.00)
GFR, Estimated: >60 mL/min (ref >60)
Glucose, Bld: 104 mg/dL — ABNORMAL HIGH (ref 70–99)
Potassium: 3.4 mmol/L — ABNORMAL LOW (ref 3.5–5.1)
Sodium: 139 mmol/L (ref 135–145)
Total Bilirubin: 0.9 mg/dL (ref 0.3–1.2)
Total Protein: 7.5 g/dL (ref 6.5–8.1)

## 2022-08-21 LAB — CBC WITH DIFFERENTIAL/PLATELET
Abs Immature Granulocytes: 0.02 10*3/uL (ref 0.00–0.07)
Basophils Absolute: 0.1 10*3/uL (ref 0.0–0.1)
Basophils Relative: 1 %
Eosinophils Absolute: 0.3 10*3/uL (ref 0.0–0.5)
Eosinophils Relative: 4 %
HCT: 46.6 % — ABNORMAL HIGH (ref 36.0–46.0)
Hemoglobin: 15.8 g/dL — ABNORMAL HIGH (ref 12.0–15.0)
Immature Granulocytes: 0 %
Lymphocytes Relative: 44 %
Lymphs Abs: 3.7 10*3/uL (ref 0.7–4.0)
MCH: 28.2 pg (ref 26.0–34.0)
MCHC: 33.9 g/dL (ref 30.0–36.0)
MCV: 83.2 fL (ref 80.0–100.0)
Monocytes Absolute: 0.5 10*3/uL (ref 0.1–1.0)
Monocytes Relative: 6 %
Neutro Abs: 3.8 10*3/uL (ref 1.7–7.7)
Neutrophils Relative %: 45 %
Platelets: 203 10*3/uL (ref 150–400)
RBC: 5.6 MIL/uL — ABNORMAL HIGH (ref 3.87–5.11)
RDW: 13.9 % (ref 11.5–15.5)
WBC: 8.4 10*3/uL (ref 4.0–10.5)
nRBC: 0 % (ref 0.0–0.2)

## 2022-08-21 LAB — TSH: TSH: 3.113 u[IU]/mL (ref 0.350–4.500)

## 2022-08-21 MED ORDER — SODIUM CHLORIDE 0.9 % IV SOLN
Freq: Once | INTRAVENOUS | Status: AC
  Filled 2022-08-21: qty 250

## 2022-08-21 MED ORDER — SODIUM CHLORIDE 0.9 % IV SOLN
200.0000 mg | Freq: Once | INTRAVENOUS | Status: AC
  Administered 2022-08-21: 200 mg via INTRAVENOUS
  Filled 2022-08-21: qty 8

## 2022-08-21 MED ORDER — HEPARIN SOD (PORK) LOCK FLUSH 100 UNIT/ML IV SOLN
500.0000 [IU] | Freq: Once | INTRAVENOUS | Status: AC | PRN
  Administered 2022-08-21: 500 [IU]
  Filled 2022-08-21: qty 5

## 2022-08-21 MED ORDER — HEPARIN SOD (PORK) LOCK FLUSH 100 UNIT/ML IV SOLN
INTRAVENOUS | Status: AC
  Filled 2022-08-21: qty 5

## 2022-08-21 NOTE — Progress Notes (Signed)
[facility] NOTE  Patient Care Team: Birdie Sons, MD as PCP - General (Family Medicine) Ubaldo Glassing Javier Docker, MD as Consulting Physician (Cardiology) Schnier, Dolores Lory, MD (Vascular Surgery) Vladimir Crofts, MD as Consulting Physician (Neurology) Milinda Pointer, MD as Referring Physician (Pain Medicine) Pa, [place] (Optometry) Ubaldo Glassing, Javier Docker, MD as Consulting Physician (Cardiology) Virgel Manifold, MD (Inactive) as Consulting Physician (Gastroenterology) Gae Dry, MD as Referring Physician (Obstetrics and Gynecology) Telford Nab, RN as Oncology Nurse Navigator Cammie Sickle, MD as Consulting Physician (Oncology)   CHIEF COMPLAINTS/PURPOSE OF CONSULTATION: lung cancer  #  Oncology History Overview Note  #MAY 2022- LLL nodule 2.3 cm Adenocarcinoma Stage IA; s/p lobectomy.  No adjuvant therapy  # CT scan 4th June 2023-highly concerning for recurrent/metastatic disease with-. New left adrenal metastasis;  Ground-glass and part solid nodules in the peripheral right lower lobe, unchanged from 11/07/2021 but slightly enlarged from 01/01/2018. Findings are suspicious for indolent adenocarcinoma.  F One- PFL1 =80%; KRAS G12C PET scan JUNE 20th- 2023-  Enlarged 4 cm hypermetabolic LEFT adrenal metastasis;  Suspicion of metastatic adenopathy to the LEFT hilum.  Part solid nodule in the RIGHT lower lobe without metabolic activity.   3 ADRENAL BIOPSY- CK7 POSITIVE ADENOCARCINOMA- There is limited tissue remaining for ancillary testing, not likely  sufficient for NGS testing.   # AUG 7th, 2023-single agent Keytruda.    Primary cancer of left lower lobe of lung ([place])  05/09/2021 Initial Diagnosis   Primary cancer of left lower lobe of lung ([place])   07/04/2022 -  Chemotherapy   Patient is on Treatment Plan : LUNG NSCLC Pembrolizumab (200) q21d     07/10/2022 - 07/10/2022 Chemotherapy   Patient is on Treatment Plan : LUNG NSCLC Pembrolizumab  (200) q21d        HISTORY OF PRESENTING ILLNESS: Alone.  Ambulating independently.  Laura Nielsen 66 y.o.  female history of smoking; prior history of lung cancer-with likely recurrence to left adrenal gland [s/p Bx CK-7 positive TTF-1 negative]-clinically suggestive of recurrent lung cancer on single agent [name] is here for follow-up.  Patient is here for consideration of cycle 3 of Keytruda. After her first cycle of Keyrtuda she had chills but she reports that this did not occur with her second cycle. She is doing well with the [place]. The facial rash she had on her face that was suspected to be due to the [place] has improved some with topical OTC hydrocortisone. No other rash. No night sweats, unintentional weight loss, GI symptoms.   Post thoracotomy pain is stable on Gabapentin 300 mg TID with extra at night  Leg cramping/suspected PVD- stable. Working on rescheduling her appointment with vascular. No new symptoms or chest pain.   COPD: Stable. She reports that he cigarette usage changes depending on her stress level. Not motivated to quit at this time.   Review of Systems  Constitutional:  Negative for chills, diaphoresis, fever, malaise/fatigue and weight loss.  HENT:  Negative for nosebleeds and sore throat.   Eyes:  Negative for double vision.  Respiratory:  Negative for cough, hemoptysis, sputum production, shortness of breath and wheezing.   Cardiovascular:  Negative for chest pain, palpitations, orthopnea and leg swelling.  Gastrointestinal:  Negative for abdominal pain, blood in stool, constipation, diarrhea, heartburn, melena, nausea and vomiting.  Genitourinary:  Negative for dysuria, frequency and urgency.  Musculoskeletal:  Negative for back pain and joint pain.  Skin: Negative.  Negative for itching  and rash.  Neurological:  Negative for dizziness, tingling, focal weakness, weakness and headaches.  Endo/Heme/Allergies:  Does not bruise/bleed easily.   Psychiatric/Behavioral:  Negative for depression. The patient is not nervous/anxious and does not have insomnia.      MEDICAL HISTORY:  Past Medical History:  Diagnosis Date   AAA (abdominal aortic aneurysm) ([place])    s/p EVAR AAA 03/27/13   Arthritis    Asthma    Complication of anesthesia    BP drops after   History of kidney stones    Osteoporosis    Sleep apnea     SURGICAL HISTORY: Past Surgical History:  Procedure Laterality Date   ABDOMINAL AORTIC ENDOVASCULAR STENT GRAFT  03/27/2013   Dr. Hortencia Pilar   Cardiac catheterization  02/2009   70-80% stenosis RCA stent placed. started on Plavix   CARDIAC CATHETERIZATION     COLONOSCOPY WITH PROPOFOL N/A 01/05/2021   Procedure: COLONOSCOPY WITH PROPOFOL;  Surgeon: Virgel Manifold, MD;  Location: [facility] ENDOSCOPY;  Service: Endoscopy;  Laterality: N/A;   CORONARY ANGIOPLASTY     stent placed   ESOPHAGOGASTRODUODENOSCOPY (EGD) WITH PROPOFOL N/A 01/05/2021   Procedure: ESOPHAGOGASTRODUODENOSCOPY (EGD) WITH PROPOFOL;  Surgeon: Virgel Manifold, MD;  Location: [facility] ENDOSCOPY;  Service: Endoscopy;  Laterality: N/A;   INTERCOSTAL NERVE BLOCK Left 04/06/2021   Procedure: INTERCOSTAL NERVE BLOCK;  Surgeon: Melrose Nakayama, MD;  Location: [facility];  Service: Thoracic;  Laterality: Left;   IR IMAGING GUIDED PORT INSERTION  06/20/2022   KIDNEY STONE SURGERY  1999   LUNG LOBECTOMY Left 04/06/2021   robotic left lower lobectomy 04/06/2021 Dr. Roxan Hockey for Stage 1A adenocarcinoma   NODE DISSECTION Left 04/06/2021   Procedure: NODE DISSECTION;  Surgeon: Melrose Nakayama, MD;  Location: [facility];  Service: Thoracic;  Laterality: Left;   TUBAL LIGATION     VAGINAL HYSTERECTOMY     Menometrorrhagia. Excessive bleeding. Unknown if cervix removed.     SOCIAL HISTORY: Social History   Socioeconomic History   Marital status: Divorced    Spouse name: Not on file   Number of children: 2   Years of education: H/S   Highest  education level: High school graduate  Occupational History   Occupation: Disabled   Occupation: retired  Tobacco Use   Smoking status: Former    Packs/day: 0.25    Years: 44.50    Total pack years: 11.13    Types: Cigarettes    Quit date: 04/05/2021    Years since quitting: 1.3   Smokeless tobacco: Never   Tobacco comments:    12/23/19 states she quit for 3 months and then started back. Pt is going to talk with MD about this.  Vaping Use   Vaping Use: Never used  Substance and Sexual Activity   Alcohol use: Yes    Comment: rarely - once a year   Drug use: No   Sexual activity: Not on file  Other Topics Concern   Not on file  Social History Narrative   Hx of smoking; quit prior to lung surgery. Lives in [place]- self. No alcohol. 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 On disability sec to spinal pain.    Social Determinants of Health   Financial Resource Strain: Low Risk  (02/10/2021)   Overall Financial Resource Strain (CARDIA)    Difficulty of Paying Living Expenses: Not hard at all  Food Insecurity: No Food Insecurity (03/21/2022)   Hunger Vital Sign    Worried About Running Out of  Food in the Last Year: Never true    [place] in the Last Year: Never true  Transportation Needs: No Transportation Needs (03/21/2022)   PRAPARE - Hydrologist (Medical): No    Lack of Transportation (Non-Medical): No  Physical Activity: Inactive (02/10/2021)   Exercise Vital Sign    Days of Exercise per Week: 0 days    Minutes of Exercise per Session: 0 min  Stress: Stress Concern Present (02/10/2021)   [place]    Feeling of Stress : To some extent  Social Connections: Socially Isolated (02/10/2021)   Social Connection and Isolation Panel [NHANES]    Frequency of Communication with Friends and Family: More than three times a week    Frequency of Social Gatherings with Friends and Family:  Once a week    Attends Religious Services: Never    Marine scientist or Organizations: No    Attends Archivist Meetings: Never    Marital Status: Divorced  Human resources officer Violence: Not At Risk (02/10/2021)   Humiliation, Afraid, Rape, and Kick questionnaire    Fear of Current or Ex-Partner: No    Emotionally Abused: No    Physically Abused: No    Sexually Abused: No    FAMILY HISTORY: Family History  Problem Relation Age of Onset   Hypertension Mother    Coronary artery disease Mother    Heart attack Mother        acute   Cancer Mother    Alcohol abuse Father    Depression Father    Hypertension Father    Heart attack Father 19       acute   Alcohol abuse Sister    Hyperlipidemia Sister    Hypertension Sister    Cancer Sister 63   Heart attack Sister        x's 2   Coronary artery disease Sister 67       x's 2   Breast cancer Sister 22    ALLERGIES:  is allergic to atorvastatin, omeprazole, and bupropion.  MEDICATIONS:  Current Outpatient Medications  Medication Sig Dispense Refill   allopurinol (ZYLOPRIM) 100 MG tablet Take 1 tablet (100 mg total) by mouth daily. 90 tablet 4   aspirin 81 MG tablet Take 81 mg by mouth daily.      azelastine (ASTELIN) 0.1 % nasal spray Place 2 sprays into both nostrils 2 (two) times daily. Use in each nostril as directed 30 mL 2   bisoprolol (ZEBETA) 10 MG tablet Take 1 tablet (10 mg total) by mouth daily. 90 tablet 4   Cholecalciferol 25 MCG (1000 UT) capsule Take 1,000 Units by mouth daily.     clopidogrel (PLAVIX) 75 MG tablet Take 1 tablet (75 mg total) by mouth daily. 90 tablet 4   cyanocobalamin 1000 MCG tablet Take 1,000 mcg by mouth daily.     fluticasone (FLONASE) 50 MCG/ACT nasal spray Place into both nostrils daily.     gabapentin (NEURONTIN) 300 MG capsule Take 1 capsule (300 mg total) by mouth 3 (three) times daily AND 2 capsules (600 mg total) at bedtime.     ibuprofen (ADVIL) 200 MG tablet Take 400 mg  by mouth 2 (two) times daily as needed (headaches).     lidocaine-prilocaine (EMLA) cream Apply on the port. 30 -45 min  prior to port access. 30 g 3   lisinopril-hydrochlorothiazide (ZESTORETIC) 20-12.5 MG tablet Take 1  tablet by mouth daily. 90 tablet 4   montelukast (SINGULAIR) 10 MG tablet Take 1 tablet (10 mg total) by mouth at bedtime. 30 tablet 3   pantoprazole (PROTONIX) 20 MG tablet Take 1 tablet (20 mg total) by mouth daily. 90 tablet 4   PROAIR HFA 108 (90 Base) MCG/ACT inhaler Inhale 2 puffs into the lungs every 6 (six) hours as needed for wheezing or shortness of breath. 8.5 g 4   rosuvastatin (CRESTOR) 20 MG tablet Take 1 tablet (20 mg total) by mouth daily. 90 tablet 4   Semaglutide, 1 MG/DOSE, (OZEMPIC, 1 MG/DOSE,) 4 MG/3ML SOPN Inject 1 mg into the skin once a week. 3 mL 2   buPROPion ER (WELLBUTRIN SR) 100 MG 12 hr tablet Take 1 tablet (100 mg total) by mouth 2 (two) times daily. 60 tablet 1   No current facility-administered medications for this visit.    Mild maculopapular rash on the [place] area left more than right.  PHYSICAL EXAMINATION: ECOG PERFORMANCE STATUS: 1 - Symptomatic but completely ambulatory  Vitals:   08/21/22 1047  BP: (!) 128/92  Pulse: 69  Temp: (!) 96.1 F (35.6 C)   Filed Weights   08/21/22 1047  Weight: 222 lb (100.7 kg)    Physical Exam HENT:     Head: Normocephalic and atraumatic.     Mouth/Throat:     Pharynx: No oropharyngeal exudate.  Eyes:     Pupils: Pupils are equal, round, and reactive to light.  Cardiovascular:     Rate and Rhythm: Normal rate and regular rhythm.  Pulmonary:     Comments: Decreased breath sounds bilaterally.  No wheeze or crackles Abdominal:     General: Bowel sounds are normal. There is no distension.     Palpations: Abdomen is soft. There is no mass.     Tenderness: There is no abdominal tenderness. There is no guarding or rebound.  Musculoskeletal:        General: No tenderness. Normal range of  motion.     Cervical back: Normal range of motion and neck supple.  Skin:    General: Skin is warm.  Neurological:     Mental Status: She is alert and oriented to person, place, and time.  Psychiatric:        Mood and Affect: Affect normal.      LABORATORY DATA:  I have reviewed the data as listed Lab Results  Component Value Date   WBC 7.7 07/31/2022   HGB 15.7 (H) 07/31/2022   HCT 47.0 (H) 07/31/2022   MCV 83.9 07/31/2022   PLT 198 07/31/2022   Recent Labs    07/04/22 1027 07/31/22 0756 08/21/22 0954  NA 139 140 139  K 3.6 3.5 3.4*  CL 104 109 105  CO2 27 25 27   GLUCOSE 97 108* 104*  BUN 15 18 18   CREATININE 0.70 0.62 0.70  CALCIUM 9.7 9.4 9.9  GFRNONAA >60 >60 >60  PROT 7.3 7.4 7.5  ALBUMIN 4.1 4.1 4.2  AST 21 26 23   ALT 15 20 17   ALKPHOS 93 88 89  BILITOT 0.8 0.6 0.9    RADIOGRAPHIC STUDIES: I have personally reviewed the radiological images as listed and agreed with the findings in the report.  ASSESSMENT & PLAN:  Encounter Diagnoses  Name Primary?   Primary cancer of left lower lobe of lung ([place])    Left adrenal mass ([place]) Yes   Personal history of tobacco use, presenting hazards to health    Primary  cancer of left lower lobe of lung ([place]) #JUNE 2023-STAGE IV--recurrent/metastatic disease with New left adrenal metastasis;  PET scan JUNE 20th- 2023-  Enlarged 4 cm hypermetabolic LEFT adrenal metastasis;  Suspicion of metastatic adenopathy to the LEFT hilum.  Part solid nodule in the RIGHT lower lobe without metabolic activity. Findings are suspicious for indolent adenocarcinoma.  One- PFL1 =80% [primary LUNG mass];  JULY 2023- S/p Biopsy of adrenal nodule- Biopsied POSITIVE for CK7 positive adenocarcinoma [QNS for NGS].  Also reviewed at the tumor conference.  Currently on single agent Keytruda [PD-L1 greater than 80% ]   #Doing well on Keyrtuda with minimal side effects. Today labs were reviewed and acceptable to proceed forward with cycle 3.    # Mild  chills- post infusion #1; did not occur with cycle 2.     # Post throacotomy pain/tingling and numbness:  Continue gabapentin -300 mg TID; and then at extra at night.  Chronic and STABLE. Continue current dose.    #Mild rash on the face-Chronic and improved some. Likely secondary to [facility] but [place] [facility]. Will watch and monitor.    # CAD/ PVD- cramping- Chronic. Encouraged her to reschedule her appointment as well as consider smoking cessation. Continue Plavix and asa.    # COPD- Chronic and stable. Smoking cessation encouraged.     # IV Access :s/p  port placement- STABLE.     *AM appts- Monday-    # DISPOSITION: # [name] today (as long as CBC is within treatment parameters)  #Follow-up in 3 weeks-MD labs/port- cbc/cmp; Keytruda- [place]    No problem-specific Assessment & Plan notes found for this encounter.  All questions were answered. The patient knows to call the clinic with any problems, questions or concerns.    Hughie Closs, PA-C 08/21/2022 11:25 AM

## 2022-08-21 NOTE — Patient Instructions (Signed)
[facility]  Discharge Instructions: Thank you for choosing [facility] to provide your oncology and hematology care.  If you have a lab appointment with the Cancer Center, please go directly to the Cancer Center and check in at the registration area.  Wear comfortable clothing and clothing appropriate for easy access to any Portacath or PICC line.   We strive to give you quality time with your provider. You may need to reschedule your appointment if you arrive late (15 or more minutes).  Arriving late affects you and other patients whose appointments are after yours.  Also, if you miss three or more appointments without notifying the office, you may be dismissed from the clinic at the provider's discretion.      For prescription refill requests, have your pharmacy contact our office and allow 72 hours for refills to be completed.    Today you received the following chemotherapy and/or immunotherapy agents Keytruda      To help prevent nausea and vomiting after your treatment, we encourage you to take your nausea medication as directed.  BELOW ARE SYMPTOMS THAT SHOULD BE REPORTED IMMEDIATELY: *FEVER GREATER THAN 100.4 F (38 C) OR HIGHER *CHILLS OR SWEATING *NAUSEA AND VOMITING THAT IS NOT CONTROLLED WITH YOUR NAUSEA MEDICATION *UNUSUAL SHORTNESS OF BREATH *UNUSUAL BRUISING OR BLEEDING *URINARY PROBLEMS (pain or burning when urinating, or frequent urination) *BOWEL PROBLEMS (unusual diarrhea, constipation, pain near the anus) TENDERNESS IN MOUTH AND THROAT WITH OR WITHOUT PRESENCE OF ULCERS (sore throat, sores in mouth, or a toothache) UNUSUAL RASH, SWELLING OR PAIN  UNUSUAL VAGINAL DISCHARGE OR ITCHING   Items with * indicate a potential emergency and should be followed up as soon as possible or go to the Emergency Department if any problems should occur.  Please show the CHEMOTHERAPY ALERT CARD or IMMUNOTHERAPY ALERT CARD at check-in to  the Emergency Department and triage nurse.  Should you have questions after your visit or need to cancel or reschedule your appointment, please contact [facility]  [phone] and follow the prompts.  Office hours are 8:00 a.m. to 4:30 p.m. Monday - Friday. Please note that voicemails left after 4:00 p.m. may not be returned until the following business day.  We are closed weekends and major holidays. You have access to a nurse at all times for urgent questions. Please call the main number to the clinic [phone] and follow the prompts.  For any non-urgent questions, you may also contact your provider using MyChart. We now offer e-Visits for anyone 18 and older to request care online for non-urgent symptoms. For details visit [email or web address].   Also download the MyChart app! Go to the app store, search "MyChart", open the app, select [facility], and log in with your MyChart username and password.  Masks are optional in the cancer centers. If you would like for your care team to wear a mask while they are taking care of you, please let them know. For doctor visits, patients may have with them one support person who is at least 65 years old. At this time, visitors are not allowed in the infusion area.   

## 2022-08-22 ENCOUNTER — Telehealth: Payer: Self-pay | Admitting: *Deleted

## 2022-08-22 LAB — T4: T4, Total: 7 ug/dL (ref 4.5–12.0)

## 2022-08-22 NOTE — Telephone Encounter (Signed)
Patient called reporting that she started having nasal congestion, runny nose, sore throat , coughing today, She said this is different from her normal allergies that she has.She is asking if she can take Nyquil. I have asked that she do a home COVID test and call me back with results. She denies know exposure to COVID but agrees to test herself and call me back

## 2022-08-23 NOTE — Telephone Encounter (Signed)
I called patient this morning since I did not hear back from her yesterday and she told me that she had trouble ith her first test an dso it  took longer to get results. She said it was negative and when she called she got answering service and Dr Janese Banks called her back and told her it is OK to take Nyquil. And if she is not better in a few days that she needs to be seen. She sounds more congested this morning, but denies fever and agrees to call if she does not improve

## 2022-08-28 ENCOUNTER — Other Ambulatory Visit: Payer: Self-pay | Admitting: Family Medicine

## 2022-08-28 ENCOUNTER — Other Ambulatory Visit: Payer: Self-pay | Admitting: Internal Medicine

## 2022-08-28 DIAGNOSIS — C3432 Malignant neoplasm of lower lobe, left bronchus or lung: Secondary | ICD-10-CM

## 2022-09-08 DIAGNOSIS — E119 Type 2 diabetes mellitus without complications: Secondary | ICD-10-CM | POA: Diagnosis not present

## 2022-09-08 LAB — HM DIABETES EYE EXAM

## 2022-09-11 ENCOUNTER — Inpatient Hospital Stay: Payer: Medicare Other

## 2022-09-11 ENCOUNTER — Encounter: Payer: Self-pay | Admitting: Internal Medicine

## 2022-09-11 ENCOUNTER — Inpatient Hospital Stay: Payer: Medicare Other | Attending: Internal Medicine | Admitting: Internal Medicine

## 2022-09-11 VITALS — BP 104/70 | HR 71 | Temp 97.2°F | Resp 18 | Ht 68.0 in | Wt 219.4 lb

## 2022-09-11 DIAGNOSIS — C7972 Secondary malignant neoplasm of left adrenal gland: Secondary | ICD-10-CM | POA: Insufficient documentation

## 2022-09-11 DIAGNOSIS — Z87891 Personal history of nicotine dependence: Secondary | ICD-10-CM | POA: Insufficient documentation

## 2022-09-11 DIAGNOSIS — C3432 Malignant neoplasm of lower lobe, left bronchus or lung: Secondary | ICD-10-CM

## 2022-09-11 DIAGNOSIS — C3490 Malignant neoplasm of unspecified part of unspecified bronchus or lung: Secondary | ICD-10-CM | POA: Diagnosis not present

## 2022-09-11 DIAGNOSIS — R519 Headache, unspecified: Secondary | ICD-10-CM | POA: Diagnosis not present

## 2022-09-11 DIAGNOSIS — Z5112 Encounter for antineoplastic immunotherapy: Secondary | ICD-10-CM | POA: Diagnosis not present

## 2022-09-11 DIAGNOSIS — Z79899 Other long term (current) drug therapy: Secondary | ICD-10-CM | POA: Diagnosis not present

## 2022-09-11 LAB — COMPREHENSIVE METABOLIC PANEL
ALT: 19 U/L (ref 0–44)
AST: 24 U/L (ref 15–41)
Albumin: 4.1 g/dL (ref 3.5–5.0)
Alkaline Phosphatase: 88 U/L (ref 38–126)
Anion gap: 5 (ref 5–15)
BUN: 18 mg/dL (ref 8–23)
CO2: 26 mmol/L (ref 22–32)
Calcium: 9.3 mg/dL (ref 8.9–10.3)
Chloride: 107 mmol/L (ref 98–111)
Creatinine, Ser: 0.48 mg/dL (ref 0.44–1.00)
GFR, Estimated: >60 mL/min (ref >60)
Glucose, Bld: 107 mg/dL — ABNORMAL HIGH (ref 70–99)
Potassium: 3.6 mmol/L (ref 3.5–5.1)
Sodium: 138 mmol/L (ref 135–145)
Total Bilirubin: 0.6 mg/dL (ref 0.3–1.2)
Total Protein: 7.2 g/dL (ref 6.5–8.1)

## 2022-09-11 LAB — CBC WITH DIFFERENTIAL/PLATELET
Abs Immature Granulocytes: 0.02 10*3/uL (ref 0.00–0.07)
Basophils Absolute: 0.1 10*3/uL (ref 0.0–0.1)
Basophils Relative: 1 %
Eosinophils Absolute: 0.4 10*3/uL (ref 0.0–0.5)
Eosinophils Relative: 4 %
HCT: 47.1 % — ABNORMAL HIGH (ref 36.0–46.0)
Hemoglobin: 16 g/dL — ABNORMAL HIGH (ref 12.0–15.0)
Immature Granulocytes: 0 %
Lymphocytes Relative: 40 %
Lymphs Abs: 3.6 10*3/uL (ref 0.7–4.0)
MCH: 28.2 pg (ref 26.0–34.0)
MCHC: 34 g/dL (ref 30.0–36.0)
MCV: 83.1 fL (ref 80.0–100.0)
Monocytes Absolute: 0.5 10*3/uL (ref 0.1–1.0)
Monocytes Relative: 6 %
Neutro Abs: 4.4 10*3/uL (ref 1.7–7.7)
Neutrophils Relative %: 49 %
Platelets: 207 10*3/uL (ref 150–400)
RBC: 5.67 MIL/uL — ABNORMAL HIGH (ref 3.87–5.11)
RDW: 14.4 % (ref 11.5–15.5)
WBC: 9 10*3/uL (ref 4.0–10.5)
nRBC: 0 % (ref 0.0–0.2)

## 2022-09-11 MED ORDER — SODIUM CHLORIDE 0.9 % IV SOLN
Freq: Once | INTRAVENOUS | Status: AC
  Filled 2022-09-11: qty 250

## 2022-09-11 MED ORDER — SODIUM CHLORIDE 0.9 % IV SOLN
200.0000 mg | Freq: Once | INTRAVENOUS | Status: AC
  Administered 2022-09-11: 200 mg via INTRAVENOUS
  Filled 2022-09-11: qty 200

## 2022-09-11 NOTE — Progress Notes (Signed)
[facility] CONSULT NOTE  Patient Care Team: Fisher, Donald E, MD as PCP - General (Family Medicine) Fath, Kenneth A, MD as Consulting Physician (Cardiology) Schnier, Gregory G, MD (Vascular Surgery) Shah, Hemang K, MD as Consulting Physician (Neurology) Naveira, Francisco, MD as Referring Physician (Pain Medicine) Pa, [facility] (Optometry) Fath, Kenneth A, MD as Consulting Physician (Cardiology) Tahiliani, Varnita B, MD (Inactive) as Consulting Physician (Gastroenterology) Harris, Robert P, MD as Referring Physician (Obstetrics and Gynecology) Rhode, Hayley, RN as Oncology Nurse Navigator Brahmanday, Govinda R, MD as Consulting Physician (Oncology)   CHIEF COMPLAINTS/PURPOSE OF CONSULTATION: lung cancer  #  Oncology History Overview Note  #MAY 2022- LLL nodule 2.3 cm Adenocarcinoma Stage IA; s/p lobectomy.  No adjuvant therapy  # CT scan 4th June 2023-highly concerning for recurrent/metastatic disease with-. New left adrenal metastasis;  Ground-glass and part solid nodules in the peripheral right lower lobe, unchanged from 11/07/2021 but slightly enlarged from 01/01/2018. Findings are suspicious for indolent adenocarcinoma.  F One- PFL1 =80%; KRAS G12C PET scan JUNE 20th- 2023-  Enlarged 4 cm hypermetabolic LEFT adrenal metastasis;  Suspicion of metastatic adenopathy to the LEFT hilum.  Part solid nodule in the RIGHT lower lobe without metabolic activity.   3 ADRENAL BIOPSY- CK7 POSITIVE ADENOCARCINOMA- There is limited tissue remaining for ancillary testing, not likely  sufficient for NGS testing.   # AUG 7th, 2023-single agent Keytruda.    Primary cancer of left lower lobe of lung (HCC)  05/09/2021 Initial Diagnosis   Primary cancer of left lower lobe of lung (HCC)   07/04/2022 -  Chemotherapy   Patient is on Treatment Plan : LUNG NSCLC Pembrolizumab (200) q21d     07/10/2022 - 07/10/2022 Chemotherapy   Patient is on Treatment Plan : LUNG NSCLC Pembrolizumab  (200) q21d        HISTORY OF PRESENTING ILLNESS: Alone.  Ambulating independently.  Laura Nielsen 66 y.o.  female history of smoking; prior history of lung cancer-with likely recurrence to left adrenal gland [s/p Bx CK-7 positive TTF-1 negative]-clinically suggestive of recurrent lung cancer on single agent Keytruda is here for follow-up.  Chronic daily headaches relieved with daily ibuprofen.  History of headaches in the past.  Recent evaluation with ophthalmology for double vision.  No problems with cataracts as per the ophthalmologist.   Weight trending down with 3 lb wt loss since last visit, decreasing appetite.  Denies any diarrhea.  Denies any abdominal pain.  Otherwise patient continues to have mild shortness of breath on exertion.  Denies any headaches.  Denies any nausea vomiting.  Complains of cramping in the legs chronic.   Review of Systems  Constitutional:  Negative for chills, diaphoresis, fever, malaise/fatigue and weight loss.  HENT:  Negative for nosebleeds and sore throat.   Eyes:  Negative for double vision.  Respiratory:  Negative for cough, hemoptysis, sputum production, shortness of breath and wheezing.   Cardiovascular:  Positive for chest pain. Negative for palpitations, orthopnea and leg swelling.  Gastrointestinal:  Negative for abdominal pain, blood in stool, constipation, diarrhea, heartburn, melena, nausea and vomiting.  Genitourinary:  Negative for dysuria, frequency and urgency.  Musculoskeletal:  Positive for back pain and joint pain.  Skin: Negative.  Negative for itching and rash.  Neurological:  Positive for tingling. Negative for dizziness, focal weakness, weakness and headaches.  Endo/Heme/Allergies:  Does not bruise/bleed easily.  Psychiatric/Behavioral:  Negative for depression. The patient is not nervous/anxious and does not have insomnia.        MEDICAL HISTORY:  Past Medical History:  Diagnosis Date   AAA (abdominal aortic aneurysm) ([place])     s/p EVAR AAA 03/27/13   Arthritis    Asthma    Complication of anesthesia    BP drops after   History of kidney stones    Osteoporosis    Sleep apnea     SURGICAL HISTORY: Past Surgical History:  Procedure Laterality Date   ABDOMINAL AORTIC ENDOVASCULAR STENT GRAFT  03/27/2013   Dr. Hortencia Pilar   Cardiac catheterization  02/2009   70-80% stenosis RCA stent placed. started on Plavix   CARDIAC CATHETERIZATION     COLONOSCOPY WITH PROPOFOL N/A 01/05/2021   Procedure: COLONOSCOPY WITH PROPOFOL;  Surgeon: Virgel Manifold, MD;  Location: [facility] END[facility]COPY;  Service: Endoscopy;  Laterality: N/A;   CORONARY ANGIOPLASTY     stent placed   ESOPHAGOGASTRODUODENOSCOPY (EGD) WITH PROPOFOL N/A 01/05/2021   Procedure: ESOPHAGOGASTRODUODENOSCOPY (EGD) WITH PROPOFOL;  Surgeon: Virgel Manifold, MD;  Location: [facility] ENDOSCOPY;  Service: Endoscopy;  Laterality: N/A;   INTERCOSTAL NERVE BLOCK Left 04/06/2021   Procedure: INTERCOSTAL NERVE BLOCK;  Surgeon: Melrose Nakayama, MD;  Location: [facility];  Service: Thoracic;  Laterality: Left;   IR IMAGING GUIDED PORT INSERTION  06/20/2022   KIDNEY STONE SURGERY  1999   LUNG LOBECTOMY Left 04/06/2021   robotic left lower lobectomy 04/06/2021 Dr. Roxan Hockey for Stage 1A adenocarcinoma   NODE DISSECTION Left 04/06/2021   Procedure: NODE DISSECTION;  Surgeon: Melrose Nakayama, MD;  Location: [facility];  Service: Thoracic;  Laterality: Left;   TUBAL LIGATION     VAGINAL HYSTERECTOMY     Menometrorrhagia. Excessive bleeding. Unknown if cervix removed.     SOCIAL HISTORY: Social History   Socioeconomic History   Marital status: Divorced    Spouse name: Not on file   Number of children: 2   Years of education: H/S   Highest education level: High school graduate  Occupational History   Occupation: Disabled   Occupation: retired  Tobacco Use   Smoking status: Former    Packs/day: 0.25    Years: 44.50    Total pack years: 11.13    Types:  Cigarettes    Quit date: 04/05/2021    Years since quitting: 1.4   Smokeless tobacco: Never   Tobacco comments:    12/23/19 states she quit for 3 months and then started back. Pt is going to talk with MD about this.  Vaping Use   Vaping Use: Never used  Substance and Sexual Activity   Alcohol use: Yes    Comment: rarely - once a year   Drug use: No   Sexual activity: Not on file  Other Topics Concern   Not on file  Social History Narrative   Hx of smoking; quit prior to lung surgery. Lives in [place]- self. No alcohol. 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 On disability sec to spinal pain.    Social Determinants of Health   Financial Resource Strain: Low Risk  (02/10/2021)   Overall Financial Resource Strain (CARDIA)    Difficulty of Paying Living Expenses: Not hard at all  Food Insecurity: No Food Insecurity (03/21/2022)   Hunger Vital Sign    Worried About Running Out of Food in the Last Year: Never true    Ran Out of Food in the Last Year: Never true  Transportation Needs: No Transportation Needs (03/21/2022)   PRAPARE - Hydrologist (Medical): No  Lack of Transportation (Non-Medical): No  Physical Activity: Inactive (02/10/2021)   Exercise Vital Sign    Days of Exercise per Week: 0 days    Minutes of Exercise per Session: 0 min  Stress: Stress Concern Present (02/10/2021)   [place]    Feeling of Stress : To some extent  Social Connections: Socially Isolated (02/10/2021)   Social Connection and Isolation Panel [NHANES]    Frequency of Communication with Friends and Family: More than three times a week    Frequency of Social Gatherings with Friends and Family: Once a week    Attends Religious Services: Never    Marine scientist or Organizations: No    Attends Archivist Meetings: Never    Marital Status: Divorced  Human resources officer Violence: Not At Risk  (02/10/2021)   Humiliation, Afraid, Rape, and Kick questionnaire    Fear of Current or Ex-Partner: No    Emotionally Abused: No    Physically Abused: No    Sexually Abused: No    FAMILY HISTORY: Family History  Problem Relation Age of Onset   Hypertension Mother    Coronary artery disease Mother    Heart attack Mother        acute   Cancer Mother    Alcohol abuse Father    Depression Father    Hypertension Father    Heart attack Father 38       acute   Alcohol abuse Sister    Hyperlipidemia Sister    Hypertension Sister    Cancer Sister 45   Heart attack Sister        x's 2   Coronary artery disease Sister 50       x's 2   Breast cancer Sister 59    ALLERGIES:  is allergic to atorvastatin, omeprazole, and bupropion.  MEDICATIONS:  Current Outpatient Medications  Medication Sig Dispense Refill   allopurinol (ZYLOPRIM) 100 MG tablet Take 1 tablet (100 mg total) by mouth daily. 90 tablet 3   aspirin 81 MG tablet Take 81 mg by mouth daily.      bisoprolol (ZEBETA) 10 MG tablet Take 1 tablet (10 mg total) by mouth daily. 90 tablet 4   Cholecalciferol 25 MCG (1000 UT) capsule Take 1,000 Units by mouth daily.     clopidogrel (PLAVIX) 75 MG tablet Take 1 tablet (75 mg total) by mouth daily. 90 tablet 4   cyanocobalamin 1000 MCG tablet Take 1,000 mcg by mouth daily.     gabapentin (NEURONTIN) 300 MG capsule Take 1 capsule (300 mg total) by mouth 3 (three) times daily AND 2 capsules (600 mg total) at bedtime.     ibuprofen (ADVIL) 200 MG tablet Take 400 mg by mouth 2 (two) times daily as needed (headaches).     lidocaine-prilocaine (EMLA) cream Apply on the port. 30 -45 min  prior to port access. 30 g 3   lisinopril-hydrochlorothiazide (ZESTORETIC) 20-12.5 MG tablet Take 1 tablet by mouth daily. 90 tablet 4   montelukast (SINGULAIR) 10 MG tablet Take 1 tablet (10 mg total) by mouth at bedtime. 30 tablet 3   pantoprazole (PROTONIX) 20 MG tablet Take 1 tablet (20 mg total) by mouth  daily. 90 tablet 4   PROAIR HFA 108 (90 Base) MCG/ACT inhaler Inhale 2 puffs into the lungs every 6 (six) hours as needed for wheezing or shortness of breath. 8.5 g 4   rosuvastatin (CRESTOR) 20 MG tablet Take 1  tablet (20 mg total) by mouth daily. 90 tablet 4   Semaglutide, 1 MG/DOSE, (OZEMPIC, 1 MG/DOSE,) 4 MG/3ML SOPN Inject 1 mg into the skin once a week. 3 mL 2   azelastine (ASTELIN) 0.1 % nasal spray Place 2 sprays into both nostrils 2 (two) times daily. Use in each nostril as directed (Patient not taking: Reported on 09/11/2022) 30 mL 2   buPROPion ER (WELLBUTRIN SR) 100 MG 12 hr tablet Take 1 tablet (100 mg total) by mouth 2 (two) times daily. 60 tablet 1   fluticasone (FLONASE) 50 MCG/ACT nasal spray Place into both nostrils daily. (Patient not taking: Reported on 09/11/2022)     No current facility-administered medications for this visit.   Facility-Administered Medications Ordered in Other Visits  Medication Dose Route Frequency Provider Last Rate Last Admin   pembrolizumab (KEYTRUDA) 200 mg in sodium chloride 0.9 % 50 mL chemo infusion  200 mg Intravenous Once Cammie Sickle, MD 116 mL/hr at 09/11/22 1040 200 mg at 09/11/22 1040    Mild maculopapular rash on the [place]er area left more than right.  PHYSICAL EXAMINATION: ECOG PERFORMANCE STATUS: 1 - Symptomatic but completely ambulatory  Vitals:   09/11/22 0900  BP: 104/70  Pulse: 71  Resp: 18  Temp: (!) 97.2 F (36.2 C)  SpO2: 97%   Filed Weights   09/11/22 0900  Weight: 219 lb 6.4 oz (99.5 kg)    Physical Exam HENT:     Head: Normocephalic and atraumatic.     Mouth/Throat:     Pharynx: No oropharyngeal exudate.  Eyes:     Pupils: Pupils are equal, round, and reactive to light.  Cardiovascular:     Rate and Rhythm: Normal rate and regular rhythm.  Pulmonary:     Comments: Decreased breath sounds bilaterally.  No wheeze or crackles Abdominal:     General: Bowel sounds are normal. There is no distension.      Palpations: Abdomen is soft. There is no mass.     Tenderness: There is no abdominal tenderness. There is no guarding or rebound.  Musculoskeletal:        General: No tenderness. Normal range of motion.     Cervical back: Normal range of motion and neck supple.  Skin:    General: Skin is warm.  Neurological:     Mental Status: She is alert and oriented to person, place, and time.  Psychiatric:        Mood and Affect: Affect normal.      LABORATORY DATA:  I have reviewed the data as listed Lab Results  Component Value Date   WBC 9.0 09/11/2022   HGB 16.0 (H) 09/11/2022   HCT 47.1 (H) 09/11/2022   MCV 83.1 09/11/2022   PLT 207 09/11/2022   Recent Labs    07/31/22 0756 08/21/22 0954 09/11/22 0922  NA 140 139 138  K 3.5 3.4* 3.6  CL 109 105 107  CO2 _0 GLUCOSE 108* 104* 107*  BUN _1 CREATININE 0.62 0.70 0.48  CALCIUM 9.4 9.9 9.3  GFRNONAA >60 >60 >60  PROT 7.4 7.5 7.2  ALBUMIN 4.1 4.2 4.1  AST _2 ALT _3 ALKPHOS 88 89 88  BILITOT 0.6 0.9 0.6    RADIOGRAPHIC STUDIES: I have personally reviewed the radiological images as listed and agreed with the findings in the report. No results found.   ASSESSMENT & PLAN:   Primary cancer of left lower lobe  of lung (HCC) #JUNE 2023-STAGE IV--recurrent/metastatic disease with New left adrenal metastasis;  PET scan JUNE 20th- 2023-  Enlarged 4 cm hypermetabolic LEFT adrenal metastasis;  Suspicion of metastatic adenopathy to the LEFT hilum.  Part solid nodule in the RIGHT lower lobe without metabolic activity. Findings are suspicious for indolent adenocarcinoma.  One- PFL1 =80% [primary LUNG mass];  JULY 2023- S/p Biopsy of adrenal nodule- Biopsied POSITIVE for CK7 positive adenocarcinoma [QNS for NGS].  Also reviewed at the tumor conference.  Currently on single agent Keytruda [PD-L1 greater than 80% ]  #Proceed with cycle #4 of Keytruda single agent... Labs today reviewed;  acceptable for treatment  today. Tolearting well except for mild chills. Proceed with CT scan prior to next visit. Will order today.   # Chronic headaches/cervical pain- not new. On NSAIDs [; monitor for now. Discussed re: MRI brain. Will order today- given new onset of double vision s/p evaluation with MRI Brain.   # Post throacotomy pain/tingling and numbness:  Continue gabapentin -300 mg TID; and then at extra at night. - STABLE.   #Mild rash on the face-question Keytruda versus others.  Recommend hydrocortisone cream over-the-counter- STABLE.   # CAD/ PVD- cramping- recommend evaluation with vascular-n  # COPD-stable encouraged continue to avoid smoking; again counseled to quit smoking; recommend evaluation with pulmonary. STABLE.   # IV Access :s/p  port placement- STABLE.  *AM appts- Monday-   # DISPOSITION: # keytruda today #Follow-up in 3 weeks-MD labs/port- cbc/cmp; Keytruda; CT scan prior; MRI Brain prior-.. Dr..B     All questions were answered. The patient knows to call the clinic with any problems, questions or concerns.    Govinda R Brahmanday, MD 09/11/2022 10:59 AM    

## 2022-09-11 NOTE — Assessment & Plan Note (Addendum)
#  JUNE 2023-STAGE IV--recurrent/metastatic disease with New left adrenal metastasis;  PET scan JUNE 20th- 2023-  Enlarged 4 cm hypermetabolic LEFT adrenal metastasis;  Suspicion of metastatic adenopathy to the LEFT hilum.  Part solid nodule in the RIGHT lower lobe without metabolic activity. Findings are suspicious for indolent adenocarcinoma.  One- PFL1 =80% [primary LUNG mass];  JULY 2023- S/p Biopsy of adrenal nodule- Biopsied POSITIVE for CK7 positive adenocarcinoma [QNS for NGS].  Also reviewed at the tumor conference.  Currently on single agent Keytruda [PD-L1 greater than 80% ]  #Proceed with cycle #4 of Keytruda single agent... Labs today reviewed;  acceptable for treatment today. Tolearting well except for mild chills. Proceed with CT scan prior to next visit. Will order today.   # Chronic headaches/cervical pain- not new. On NSAIDs [; monitor for now. Discussed re: MRI brain. Will order today- given new onset of double vision s/p evaluation with MRI Brain.   # Post throacotomy pain/tingling and numbness:  Continue gabapentin -300 mg TID; and then at extra at night. - STABLE.   #Mild rash on the face-question Keytruda versus others.  Recommend hydrocortisone cream over-the-counter- STABLE.   # CAD/ PVD- cramping- recommend evaluation with vascular-n  # COPD-stable encouraged continue to avoid smoking; again counseled to quit smoking; recommend evaluation with pulmonary. STABLE.   # IV Access :s/p  port placement- STABLE.  *AM appts- Monday-   # DISPOSITION: # keytruda today #Follow-up in 3 weeks-MD labs/port- cbc/cmp; Keytruda; CT scan prior; MRI Brain prior-.. Dr..B

## 2022-09-11 NOTE — Patient Instructions (Signed)
[facility]  Discharge Instructions: Thank you for choosing [facility] to provide your oncology and hematology care.  If you have a lab appointment with the Cancer Center, please go directly to the Cancer Center and check in at the registration area.  Wear comfortable clothing and clothing appropriate for easy access to any Portacath or PICC line.   We strive to give you quality time with your provider. You may need to reschedule your appointment if you arrive late (15 or more minutes).  Arriving late affects you and other patients whose appointments are after yours.  Also, if you miss three or more appointments without notifying the office, you may be dismissed from the clinic at the provider's discretion.      For prescription refill requests, have your pharmacy contact our office and allow 72 hours for refills to be completed.    Today you received the following chemotherapy and/or immunotherapy agents Keytruda      To help prevent nausea and vomiting after your treatment, we encourage you to take your nausea medication as directed.  BELOW ARE SYMPTOMS THAT SHOULD BE REPORTED IMMEDIATELY: *FEVER GREATER THAN 100.4 F (38 C) OR HIGHER *CHILLS OR SWEATING *NAUSEA AND VOMITING THAT IS NOT CONTROLLED WITH YOUR NAUSEA MEDICATION *UNUSUAL SHORTNESS OF BREATH *UNUSUAL BRUISING OR BLEEDING *URINARY PROBLEMS (pain or burning when urinating, or frequent urination) *BOWEL PROBLEMS (unusual diarrhea, constipation, pain near the anus) TENDERNESS IN MOUTH AND THROAT WITH OR WITHOUT PRESENCE OF ULCERS (sore throat, sores in mouth, or a toothache) UNUSUAL RASH, SWELLING OR PAIN  UNUSUAL VAGINAL DISCHARGE OR ITCHING   Items with * indicate a potential emergency and should be followed up as soon as possible or go to the Emergency Department if any problems should occur.  Please show the CHEMOTHERAPY ALERT CARD or IMMUNOTHERAPY ALERT CARD at check-in to  the Emergency Department and triage nurse.  Should you have questions after your visit or need to cancel or reschedule your appointment, please contact [facility]  [phone] and follow the prompts.  Office hours are 8:00 a.m. to 4:30 p.m. Monday - Friday. Please note that voicemails left after 4:00 p.m. may not be returned until the following business day.  We are closed weekends and major holidays. You have access to a nurse at all times for urgent questions. Please call the main number to the clinic [phone] and follow the prompts.  For any non-urgent questions, you may also contact your provider using MyChart. We now offer e-Visits for anyone 18 and older to request care online for non-urgent symptoms. For details visit [email or web address].   Also download the MyChart app! Go to the app store, search "MyChart", open the app, select [facility], and log in with your MyChart username and password.  Masks are optional in the cancer centers. If you would like for your care team to wear a mask while they are taking care of you, please let them know. For doctor visits, patients may have with them one support person who is at least 65 years old. At this time, visitors are not allowed in the infusion area.   

## 2022-09-11 NOTE — Progress Notes (Signed)
Chronic daily headaches relieved with daily ibuprofen.   Weight trending down with 3 lb wt loss since last visit, decreasing appetite.   BP 104/70, HR 71

## 2022-09-18 ENCOUNTER — Other Ambulatory Visit: Payer: Self-pay | Admitting: Family Medicine

## 2022-09-18 DIAGNOSIS — E119 Type 2 diabetes mellitus without complications: Secondary | ICD-10-CM

## 2022-09-19 ENCOUNTER — Inpatient Hospital Stay: Payer: Medicare Other

## 2022-09-19 NOTE — Progress Notes (Signed)
Nutrition Follow-up:  Patient with lung cancer and adrenal lesion.  Patient receiving [place].    Spoke with patient via phone.  Patient reports that appetite is about the same.  Some days she is able to eat more than others.  Has eaten an apple and banana so far today.  Will be cooking and evening meal soon for her and grand-daughter.  Has not tried oral nutrition supplements.  Goes to [employer] at times and gets a milkshake.    Medications: reviewed  Labs: reviewed  Anthropometrics:   Weight 219 lb on 10/9  221 lb 12.8 oz on 8/28 225 lb on 8/11 239 lb on 12/29/21   NUTRITION DIAGNOSIS: Inadequate oral intake ongoing    INTERVENTION:  Continue to encouraged good sources of protein.  Add peanut butter to fruit Gran-daughter good motivator for patient to cook and eat with.  Stays with her frequently.      MONITORING, EVALUATION, GOAL: weight trends, intake   NEXT VISIT: Tuesday, Nov 21 phone call  Laura Nielsen B. Zenia Resides, [place], [place] Registered Dietitian [phone]

## 2022-09-26 ENCOUNTER — Ambulatory Visit
Admission: RE | Admit: 2022-09-26 | Discharge: 2022-09-26 | Disposition: A | Discharge status: Home / Self Care | Payer: Medicare Other | Source: Ambulatory Visit | Attending: Internal Medicine | Admitting: Internal Medicine

## 2022-09-26 DIAGNOSIS — C3432 Malignant neoplasm of lower lobe, left bronchus or lung: Secondary | ICD-10-CM | POA: Diagnosis not present

## 2022-09-26 DIAGNOSIS — J432 Centrilobular emphysema: Secondary | ICD-10-CM | POA: Diagnosis not present

## 2022-09-26 DIAGNOSIS — R519 Headache, unspecified: Secondary | ICD-10-CM | POA: Diagnosis not present

## 2022-09-26 DIAGNOSIS — C3490 Malignant neoplasm of unspecified part of unspecified bronchus or lung: Secondary | ICD-10-CM | POA: Diagnosis not present

## 2022-09-26 DIAGNOSIS — I700 Atherosclerosis of aorta: Secondary | ICD-10-CM | POA: Diagnosis not present

## 2022-09-26 DIAGNOSIS — N281 Cyst of kidney, acquired: Secondary | ICD-10-CM | POA: Diagnosis not present

## 2022-09-26 DIAGNOSIS — N858 Other specified noninflammatory disorders of uterus: Secondary | ICD-10-CM | POA: Diagnosis not present

## 2022-09-26 DIAGNOSIS — C7972 Secondary malignant neoplasm of left adrenal gland: Secondary | ICD-10-CM | POA: Diagnosis not present

## 2022-09-26 DIAGNOSIS — C7492 Malignant neoplasm of unspecified part of left adrenal gland: Secondary | ICD-10-CM | POA: Diagnosis not present

## 2022-09-26 MED ORDER — IOHEXOL 300 MG/ML  SOLN
100.0000 mL | Freq: Once | INTRAMUSCULAR | Status: AC | PRN
  Administered 2022-09-26: 100 mL via INTRAVENOUS

## 2022-09-28 ENCOUNTER — Ambulatory Visit
Admission: RE | Admit: 2022-09-28 | Discharge: 2022-09-28 | Disposition: A | Discharge status: Home / Self Care | Payer: Medicare Other | Source: Ambulatory Visit | Attending: Internal Medicine | Admitting: Internal Medicine

## 2022-09-28 DIAGNOSIS — C3432 Malignant neoplasm of lower lobe, left bronchus or lung: Secondary | ICD-10-CM | POA: Diagnosis not present

## 2022-09-28 DIAGNOSIS — R519 Headache, unspecified: Secondary | ICD-10-CM | POA: Insufficient documentation

## 2022-09-28 DIAGNOSIS — C3490 Malignant neoplasm of unspecified part of unspecified bronchus or lung: Secondary | ICD-10-CM | POA: Diagnosis not present

## 2022-09-28 MED ORDER — GADOBUTROL 1 MMOL/ML IV SOLN
9.0000 mL | Freq: Once | INTRAVENOUS | Status: AC | PRN
  Administered 2022-09-28: 9 mL via INTRAVENOUS

## 2022-10-02 ENCOUNTER — Inpatient Hospital Stay: Payer: Medicare Other

## 2022-10-02 ENCOUNTER — Encounter (INDEPENDENT_AMBULATORY_CARE_PROVIDER_SITE_OTHER): Payer: Self-pay

## 2022-10-02 ENCOUNTER — Inpatient Hospital Stay (HOSPITAL_BASED_OUTPATIENT_CLINIC_OR_DEPARTMENT_OTHER): Payer: Medicare Other | Admitting: Internal Medicine

## 2022-10-02 ENCOUNTER — Encounter: Payer: Self-pay | Admitting: Internal Medicine

## 2022-10-02 VITALS — BP 119/79 | HR 71 | Temp 97.5°F | Resp 16 | Wt 220.9 lb

## 2022-10-02 DIAGNOSIS — C3432 Malignant neoplasm of lower lobe, left bronchus or lung: Secondary | ICD-10-CM

## 2022-10-02 DIAGNOSIS — Z5112 Encounter for antineoplastic immunotherapy: Secondary | ICD-10-CM | POA: Diagnosis not present

## 2022-10-02 DIAGNOSIS — C7972 Secondary malignant neoplasm of left adrenal gland: Secondary | ICD-10-CM | POA: Diagnosis not present

## 2022-10-02 DIAGNOSIS — Z79899 Other long term (current) drug therapy: Secondary | ICD-10-CM | POA: Diagnosis not present

## 2022-10-02 DIAGNOSIS — Z87891 Personal history of nicotine dependence: Secondary | ICD-10-CM | POA: Diagnosis not present

## 2022-10-02 LAB — CBC WITH DIFFERENTIAL/PLATELET
Abs Immature Granulocytes: 0.02 10*3/uL (ref 0.00–0.07)
Basophils Absolute: 0.1 10*3/uL (ref 0.0–0.1)
Basophils Relative: 1 %
Eosinophils Absolute: 0.4 10*3/uL (ref 0.0–0.5)
Eosinophils Relative: 3 %
HCT: 45.8 % (ref 36.0–46.0)
Hemoglobin: 15.5 g/dL — ABNORMAL HIGH (ref 12.0–15.0)
Immature Granulocytes: 0 %
Lymphocytes Relative: 28 %
Lymphs Abs: 2.9 10*3/uL (ref 0.7–4.0)
MCH: 28.5 pg (ref 26.0–34.0)
MCHC: 33.8 g/dL (ref 30.0–36.0)
MCV: 84.2 fL (ref 80.0–100.0)
Monocytes Absolute: 0.6 10*3/uL (ref 0.1–1.0)
Monocytes Relative: 5 %
Neutro Abs: 6.6 10*3/uL (ref 1.7–7.7)
Neutrophils Relative %: 63 %
Platelets: 213 10*3/uL (ref 150–400)
RBC: 5.44 MIL/uL — ABNORMAL HIGH (ref 3.87–5.11)
RDW: 14.4 % (ref 11.5–15.5)
WBC: 10.5 10*3/uL (ref 4.0–10.5)
nRBC: 0 % (ref 0.0–0.2)

## 2022-10-02 LAB — COMPREHENSIVE METABOLIC PANEL
ALT: 17 U/L (ref 0–44)
AST: 23 U/L (ref 15–41)
Albumin: 3.8 g/dL (ref 3.5–5.0)
Alkaline Phosphatase: 85 U/L (ref 38–126)
Anion gap: 3 — ABNORMAL LOW (ref 5–15)
BUN: 15 mg/dL (ref 8–23)
CO2: 26 mmol/L (ref 22–32)
Calcium: 9 mg/dL (ref 8.9–10.3)
Chloride: 108 mmol/L (ref 98–111)
Creatinine, Ser: 0.58 mg/dL (ref 0.44–1.00)
GFR, Estimated: >60 mL/min (ref >60)
Glucose, Bld: 106 mg/dL — ABNORMAL HIGH (ref 70–99)
Potassium: 3.5 mmol/L (ref 3.5–5.1)
Sodium: 137 mmol/L (ref 135–145)
Total Bilirubin: 0.5 mg/dL (ref 0.3–1.2)
Total Protein: 7 g/dL (ref 6.5–8.1)

## 2022-10-02 MED ORDER — SODIUM CHLORIDE 0.9 % IV SOLN
Freq: Once | INTRAVENOUS | Status: AC
  Filled 2022-10-02: qty 250

## 2022-10-02 MED ORDER — SODIUM CHLORIDE 0.9% FLUSH
10.0000 mL | INTRAVENOUS | Status: DC | PRN
  Administered 2022-10-02: 10 mL
  Filled 2022-10-02: qty 10

## 2022-10-02 MED ORDER — SODIUM CHLORIDE 0.9 % IV SOLN
200.0000 mg | Freq: Once | INTRAVENOUS | Status: AC
  Administered 2022-10-02: 200 mg via INTRAVENOUS
  Filled 2022-10-02: qty 8

## 2022-10-02 MED ORDER — HEPARIN SOD (PORK) LOCK FLUSH 100 UNIT/ML IV SOLN
500.0000 [IU] | Freq: Once | INTRAVENOUS | Status: AC | PRN
  Administered 2022-10-02: 500 [IU]
  Filled 2022-10-02: qty 5

## 2022-10-02 NOTE — Progress Notes (Signed)
Patient denies new problems/concerns today.   °

## 2022-10-02 NOTE — Progress Notes (Signed)
[place] NOTE  Patient Care Team: Birdie Sons, MD as PCP - General (Family Medicine) Ubaldo Glassing Javier Docker, MD as Consulting Physician (Cardiology) Schnier, Dolores Lory, MD (Vascular Surgery) Vladimir Crofts, MD as Consulting Physician (Neurology) Milinda Pointer, MD as Referring Physician (Pain Medicine) Pa, [place] (Optometry) Ubaldo Glassing, Javier Docker, MD as Consulting Physician (Cardiology) Virgel Manifold, MD (Inactive) as Consulting Physician (Gastroenterology) Gae Dry, MD as Referring Physician (Obstetrics and Gynecology) Telford Nab, RN as Oncology Nurse Navigator Cammie Sickle, MD as Consulting Physician (Oncology)   CHIEF COMPLAINTS/PURPOSE OF CONSULTATION: lung cancer  #  Oncology History Overview Note  #MAY 2022- LLL nodule 2.3 cm Adenocarcinoma Stage IA; s/p lobectomy.  No adjuvant therapy  # CT scan 4th June 2023-highly concerning for recurrent/metastatic disease with-. New left adrenal metastasis;  Ground-glass and part solid nodules in the peripheral right lower lobe, unchanged from 11/07/2021 but slightly enlarged from 01/01/2018. Findings are suspicious for indolent adenocarcinoma.  F One- PFL1 =80%; KRAS G12C PET scan JUNE 20th- 2023-  Enlarged 4 cm hypermetabolic LEFT adrenal metastasis;  Suspicion of metastatic adenopathy to the LEFT hilum.  Part solid nodule in the RIGHT lower lobe without metabolic activity.   3 ADRENAL BIOPSY- CK7 POSITIVE ADENOCARCINOMA- There is limited tissue remaining for ancillary testing, not likely  sufficient for NGS testing.   # AUG 7th, 2023-single agent Keytruda.    Primary cancer of left lower lobe of lung ([place])  05/09/2021 Initial Diagnosis   Primary cancer of left lower lobe of lung ([place])   07/04/2022 -  Chemotherapy   Patient is on Treatment Plan : LUNG NSCLC Pembrolizumab (200) q21d     07/10/2022 - 07/10/2022 Chemotherapy   Patient is on Treatment Plan : LUNG NSCLC Pembrolizumab  (200) q21d        HISTORY OF PRESENTING ILLNESS: Alone.  Ambulating independently.  Laura Nielsen 66 y.o.  female history of smoking; prior history of lung cancer-with likely recurrence to left adrenal gland [s/p Bx CK-7 positive TTF-1 negative]-clinically suggestive of recurrent lung cancer on single agent [name] is here for follow-up; and review results of the CT scan/MRI brain.  Patient denies new problems/concerns today.  Chronic daily headaches relieved with daily ibuprofen.  History of headaches in the past.  Recent evaluation with ophthalmology for double vision.  No problems with cataracts as per the ophthalmologist.   Denies any diarrhea.  Denies any abdominal pain. Otherwise patient continues to have mild shortness of breath on exertion.  Denies any headaches.  Denies any nausea vomiting.  Complains of cramping in the legs chronic.   Review of Systems  Constitutional:  Negative for chills, diaphoresis, fever, malaise/fatigue and weight loss.  HENT:  Negative for nosebleeds and sore throat.   Eyes:  Negative for double vision.  Respiratory:  Negative for cough, hemoptysis, sputum production, shortness of breath and wheezing.   Cardiovascular:  Positive for chest pain. Negative for palpitations, orthopnea and leg swelling.  Gastrointestinal:  Negative for abdominal pain, blood in stool, constipation, diarrhea, heartburn, melena, nausea and vomiting.  Genitourinary:  Negative for dysuria, frequency and urgency.  Musculoskeletal:  Positive for back pain and joint pain.  Skin: Negative.  Negative for itching and rash.  Neurological:  Positive for tingling. Negative for dizziness, focal weakness, weakness and headaches.  Endo/Heme/Allergies:  Does not bruise/bleed easily.  Psychiatric/Behavioral:  Negative for depression. The patient is not nervous/anxious and does not have insomnia.      MEDICAL  HISTORY:  Past Medical History:  Diagnosis Date   AAA (abdominal aortic aneurysm)  ([place])    s/p EVAR AAA 03/27/13   Arthritis    Asthma    Complication of anesthesia    BP drops after   History of kidney stones    Osteoporosis    Sleep apnea     SURGICAL HISTORY: Past Surgical History:  Procedure Laterality Date   ABDOMINAL AORTIC ENDOVASCULAR STENT GRAFT  03/27/2013   Dr. Hortencia Pilar   Cardiac catheterization  02/2009   70-80% stenosis RCA stent placed. started on Plavix   CARDIAC CATHETERIZATION     COLONOSCOPY WITH PROPOFOL N/A 01/05/2021   Procedure: COLONOSCOPY WITH PROPOFOL;  Surgeon: Virgel Manifold, MD;  Location: [facility];  Service: Endoscopy;  Laterality: N/A;   CORONARY ANGIOPLASTY     stent placed   ESOPHAGOGASTRODUODENOSCOPY (EGD) WITH PROPOFOL N/A 01/05/2021   Procedure: ESOPHAGOGASTRODUODENOSCOPY (EGD) WITH PROPOFOL;  Surgeon: Virgel Manifold, MD;  Location: [facility];  Service: Endoscopy;  Laterality: N/A;   INTERCOSTAL NERVE BLOCK Left 04/06/2021   Procedure: INTERCOSTAL NERVE BLOCK;  Surgeon: Melrose Nakayama, MD;  Location: [facility];  Service: Thoracic;  Laterality: Left;   IR IMAGING GUIDED PORT INSERTION  06/20/2022   KIDNEY STONE SURGERY  1999   LUNG LOBECTOMY Left 04/06/2021   robotic left lower lobectomy 04/06/2021 Dr. Roxan Hockey for Stage 1A adenocarcinoma   NODE DISSECTION Left 04/06/2021   Procedure: NODE DISSECTION;  Surgeon: Melrose Nakayama, MD;  Location: [facility];  Service: Thoracic;  Laterality: Left;   TUBAL LIGATION     VAGINAL HYSTERECTOMY     Menometrorrhagia. Excessive bleeding. Unknown if cervix removed.     SOCIAL HISTORY: Social History   Socioeconomic History   Marital status: Divorced    Spouse name: Not on file   Number of children: 2   Years of education: H/S   Highest education level: High school graduate  Occupational History   Occupation: Disabled   Occupation: retired  Tobacco Use   Smoking status: Former    Packs/day: 0.25    Years: 44.50    Total pack years: 11.13     Types: Cigarettes    Quit date: 04/05/2021    Years since quitting: 1.4   Smokeless tobacco: Never   Tobacco comments:    12/23/19 states she quit for 3 months and then started back. Pt is going to talk with MD about this.  Vaping Use   Vaping Use: Never used  Substance and Sexual Activity   Alcohol use: Yes    Comment: rarely - once a year   Drug use: No   Sexual activity: Not on file  Other Topics Concern   Not on file  Social History Narrative   Hx of smoking; quit prior to lung surgery. Lives in [place]- self. No alcohol. 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 On disability sec to spinal pain.    Social Determinants of Health   Financial Resource Strain: Low Risk  (02/10/2021)   Overall Financial Resource Strain (CARDIA)    Difficulty of Paying Living Expenses: Not hard at all  Food Insecurity: No Food Insecurity (03/21/2022)   Hunger Vital Sign    Worried About Running Out of Food in the Last Year: Never true    Ran Out of Food in the Last Year: Never true  Transportation Needs: No Transportation Needs (03/21/2022)   PRAPARE - Hydrologist (Medical): No  Lack of Transportation (Non-Medical): No  Physical Activity: Inactive (02/10/2021)   Exercise Vital Sign    Days of Exercise per Week: 0 days    Minutes of Exercise per Session: 0 min  Stress: Stress Concern Present (02/10/2021)   [place]    Feeling of Stress : To some extent  Social Connections: Socially Isolated (02/10/2021)   Social Connection and Isolation Panel [NHANES]    Frequency of Communication with Friends and Family: More than three times a week    Frequency of Social Gatherings with Friends and Family: Once a week    Attends Religious Services: Never    Marine scientist or Organizations: No    Attends Archivist Meetings: Never    Marital Status: Divorced  Human resources officer Violence: Not At Risk  (02/10/2021)   Humiliation, Afraid, Rape, and Kick questionnaire    Fear of Current or Ex-Partner: No    Emotionally Abused: No    Physically Abused: No    Sexually Abused: No    FAMILY HISTORY: Family History  Problem Relation Age of Onset   Hypertension Mother    Coronary artery disease Mother    Heart attack Mother        acute   Cancer Mother    Alcohol abuse Father    Depression Father    Hypertension Father    Heart attack Father 50       acute   Alcohol abuse Sister    Hyperlipidemia Sister    Hypertension Sister    Cancer Sister 80   Heart attack Sister        x's 2   Coronary artery disease Sister 72       x's 2   Breast cancer Sister 80    ALLERGIES:  is allergic to atorvastatin, omeprazole, and bupropion.  MEDICATIONS:  Current Outpatient Medications  Medication Sig Dispense Refill   allopurinol (ZYLOPRIM) 100 MG tablet Take 1 tablet (100 mg total) by mouth daily. 90 tablet 3   aspirin 81 MG tablet Take 81 mg by mouth daily.      bisoprolol (ZEBETA) 10 MG tablet Take 1 tablet (10 mg total) by mouth daily. 90 tablet 4   Cholecalciferol 25 MCG (1000 UT) capsule Take 1,000 Units by mouth daily.     clopidogrel (PLAVIX) 75 MG tablet Take 1 tablet (75 mg total) by mouth daily. 90 tablet 4   cyanocobalamin 1000 MCG tablet Take 1,000 mcg by mouth daily.     gabapentin (NEURONTIN) 300 MG capsule Take 1 capsule (300 mg total) by mouth 3 (three) times daily AND 2 capsules (600 mg total) at bedtime.     ibuprofen (ADVIL) 200 MG tablet Take 400 mg by mouth 2 (two) times daily as needed (headaches).     lidocaine-prilocaine (EMLA) cream Apply on the port. 30 -45 min  prior to port access. 30 g 3   lisinopril-hydrochlorothiazide (ZESTORETIC) 20-12.5 MG tablet Take 1 tablet by mouth daily. 90 tablet 4   montelukast (SINGULAIR) 10 MG tablet Take 1 tablet (10 mg total) by mouth at bedtime. 30 tablet 3   OZEMPIC, 1 MG/DOSE, 4 MG/3ML SOPN Inject 1 mg into the skin once a week.  3 mL 4   pantoprazole (PROTONIX) 20 MG tablet Take 1 tablet (20 mg total) by mouth daily. 90 tablet 4   PROAIR HFA 108 (90 Base) MCG/ACT inhaler Inhale 2 puffs into the lungs every 6 (six) hours  as needed for wheezing or shortness of breath. 8.5 g 4   rosuvastatin (CRESTOR) 20 MG tablet Take 1 tablet (20 mg total) by mouth daily. 90 tablet 4   azelastine (ASTELIN) 0.1 % nasal spray Place 2 sprays into both nostrils 2 (two) times daily. Use in each nostril as directed (Patient not taking: Reported on 09/11/2022) 30 mL 2   buPROPion ER (WELLBUTRIN SR) 100 MG 12 hr tablet Take 1 tablet (100 mg total) by mouth 2 (two) times daily. 60 tablet 1   fluticasone (FLONASE) 50 MCG/ACT nasal spray Place into both nostrils daily. (Patient not taking: Reported on 09/11/2022)     No current facility-administered medications for this visit.    Mild maculopapular rash on the [place] area left more than right.  PHYSICAL EXAMINATION: ECOG PERFORMANCE STATUS: 1 - Symptomatic but completely ambulatory  Vitals:   10/02/22 0900  BP: 119/79  Pulse: 71  Resp: 16  Temp: (!) 97.5 F (36.4 C)  SpO2: 95%   Filed Weights   10/02/22 0900  Weight: 220 lb 14.4 oz (100.2 kg)    Physical Exam HENT:     Head: Normocephalic and atraumatic.     Mouth/Throat:     Pharynx: No oropharyngeal exudate.  Eyes:     Pupils: Pupils are equal, round, and reactive to light.  Cardiovascular:     Rate and Rhythm: Normal rate and regular rhythm.  Pulmonary:     Comments: Decreased breath sounds bilaterally.  No wheeze or crackles Abdominal:     General: Bowel sounds are normal. There is no distension.     Palpations: Abdomen is soft. There is no mass.     Tenderness: There is no abdominal tenderness. There is no guarding or rebound.  Musculoskeletal:        General: No tenderness. Normal range of motion.     Cervical back: Normal range of motion and neck supple.  Skin:    General: Skin is warm.  Neurological:     Mental  Status: She is alert and oriented to person, place, and time.  Psychiatric:        Mood and Affect: Affect normal.      LABORATORY DATA:  I have reviewed the data as listed Lab Results  Component Value Date   WBC 10.5 10/02/2022   HGB 15.5 (H) 10/02/2022   HCT 45.8 10/02/2022   MCV 84.2 10/02/2022   PLT 213 10/02/2022   Recent Labs    08/21/22 0954 09/11/22 0922 10/02/22 0827  NA 139 138 137  K 3.4* 3.6 3.5  CL 105 107 108  CO2 _0 GLUCOSE 104* 107* 106*  BUN _1 CREATININE 0.70 0.48 0.58  CALCIUM 9.9 9.3 9.0  GFRNONAA >60 >60 >60  PROT 7.5 7.2 7.0  ALBUMIN 4.2 4.1 3.8  AST _2 ALT _3 ALKPHOS 89 88 85  BILITOT 0.9 0.6 0.5    RADIOGRAPHIC STUDIES: I have personally reviewed the radiological images as listed and agreed with the findings in the report. MR BRAIN W WO CONTRAST  Result Date: 09/30/2022 CLINICAL DATA:  Non-small cell lung carcinoma staging.  Headaches. EXAM: MRI HEAD WITHOUT AND WITH CONTRAST TECHNIQUE: Multiplanar, multiecho pulse sequences of the brain and surrounding structures were obtained without and with intravenous contrast. CONTRAST:  14m GADAVIST GADOBUTROL 1 MMOL/ML IV SOLN COMPARISON:  11/14/2019 FINDINGS: Brain: No acute infarct, mass effect or extra-axial collection. No acute or chronic hemorrhage. There  is multifocal hyperintense T2-weighted signal within the white matter. Generalized volume loss. The midline structures are normal. There is no abnormal contrast enhancement. Vascular: Major flow voids are preserved. Skull and upper cervical spine: Normal calvarium and skull base. Visualized upper cervical spine and soft tissues are normal. Sinuses/Orbits:No paranasal sinus fluid levels or advanced mucosal thickening. No mastoid or middle ear effusion. Normal orbits. IMPRESSION: 1. No intracranial metastatic disease. 2. Findings of chronic small vessel ischemia and volume loss. Electronically Signed   By: Ulyses Jarred M.D.    On: 09/30/2022 18:56   CT CHEST ABDOMEN PELVIS W CONTRAST  Result Date: 09/26/2022 CLINICAL DATA:  Non-small cell lung cancer, assess treatment response. * Tracking Code: BO * EXAM: CT CHEST, ABDOMEN, AND PELVIS WITH CONTRAST TECHNIQUE: Multidetector CT imaging of the chest, abdomen and pelvis was performed following the standard protocol during bolus administration of intravenous contrast. RADIATION DOSE REDUCTION: This exam was performed according to the departmental dose-optimization program which includes automated exposure control, adjustment of the mA and/or kV according to patient size and/or use of iterative reconstruction technique. CONTRAST:  171m OMNIPAQUE IOHEXOL 300 MG/ML  SOLN COMPARISON:  PET 05/23/2022, 08/11/2020 and CT chest 05/08/2022. FINDINGS: CT CHEST FINDINGS Cardiovascular: Right IJ Port-A-Cath terminates at the SVC RA junction. Atherosclerotic calcification of the aorta and coronary arteries. Heart size normal. Small amount of dependent pericardial fluid appears similar. Mediastinum/Nodes: Mediastinal lymph nodes are not enlarged by CT size criteria. Hilar lymph nodes measure up to 7 mm on the left. No axillary adenopathy. Esophagus is grossly unremarkable. Lungs/Pleura: Centrilobular emphysema. Residual smoking related respiratory bronchiolitis. Ground-glass nodules in the right lower lobe measure up to approximately 1.6 x 2.1 cm (4/81) with a possible developing 5 mm solid component (4/80). Bibasilar scarring. No pleural fluid. Left lower lobectomy. Musculoskeletal: Degenerative changes in the spine. Grade 1 anterolisthesis of C7 on T1. No worrisome lytic or sclerotic lesions. CT ABDOMEN PELVIS FINDINGS Hepatobiliary: Liver is slightly decreased in attenuation diffusely. Liver and gallbladder are otherwise unremarkable. No biliary ductal dilatation. Pancreas: Negative. Spleen: Negative. Adrenals/Urinary Tract: Right adrenal gland is unremarkable. Residual left adrenal nodule measures  1.1 x 1.7 cm, decreased in size from 3.6 x 3.8 cm on 05/08/2022. 2.1 cm cyst in the lower pole right kidney. No specific follow-up necessary. Scarring in the right kidney. Kidneys are otherwise unremarkable. Ureters are decompressed. Bladder is relatively low in volume. Stomach/Bowel: Stomach, small bowel and colon are unremarkable. Appendix is not readily visualized. Vascular/Lymphatic: Atherosclerotic calcification of the aorta. Aorto bi-iliac endograft stent. No pathologically enlarged lymph nodes. Reproductive: Hysterectomy. Septated cystic mass in the right adnexa measures 5.1 x 6.5 cm (2/109), previously 5.0 x 5.8 cm on 08/11/2020. Other: No free fluid.  Mesenteries and peritoneum are unremarkable. Musculoskeletal: Degenerative changes in the spine. No worrisome lytic or sclerotic lesions. IMPRESSION: 1. Interval response to therapy as evidenced by marked decrease in size of a left adrenal metastasis. 2. Ground-glass nodules in the right lower lobe, 1 of which may have a developing internal solid component. Recommend continued attention on follow-up as adenocarcinoma is considered. 3. Mildly complex cystic right adnexal mass, slightly increased in size from remote prior exam 08/11/2020. Because this lesion is not adequately characterized, prompt [place] recommended for further evaluation. Note: This recommendation does not apply to premenarchal patients and to those with increased risk (genetic, family history, elevated tumor markers or other high-risk factors) of ovarian cancer. Reference: JACR 2020 Feb; 17(2):248-254 4. Hepatic steatosis. 5. Aortic atherosclerosis (ICD10-I70.0). Coronary artery  calcification. 6.  Emphysema (ICD10-J43.9). Electronically Signed   By: Lorin Picket M.D.   On: 09/26/2022 14:15     ASSESSMENT & PLAN:   Primary cancer of left lower lobe of lung ([place]) #JUNE 2023-STAGE IV--recurrent/metastatic disease with New left adrenal metastasis;  PET scan JUNE 20th- 2023-  Enlarged 4 cm  hypermetabolic LEFT adrenal metastasis;  Suspicion of metastatic adenopathy to the LEFT hilum.  Part solid nodule in the RIGHT lower lobe without metabolic activity. Findings are suspicious for indolent adenocarcinoma.  One- PFL1 =80% [primary LUNG mass];  JULY 2023- S/p Biopsy of adrenal nodule- Biopsied POSITIVE for CK7 positive adenocarcinoma [QNS for NGS]. Currently on single agent Keytruda [PD-L1 greater than 80% ] ;   # OCT 24th, 2023-  Interval response to therapy as evidenced by marked decrease in size of a left adrenal metastasis. Ground-glass nodules in the right lower lobe, 1 of which may have a developing internal solid component. Recommend continued attention on follow-up as adenocarcinoma is considered.  # Proceed with cycle #5 of Keytruda single agent... Labs today reviewed;  acceptable for treatment today. Tolearting well except for mild chills. Will plan re-imaging in 3 months or so.   # Chronic headaches/cervical pain/ visual blurriness- not new. On NSAIDs [; monitor for now. OCT 2023- MRI brain- NEGATIVE for any metastatic disease. Defer to opthalmology.   # Post throacotomy pain/tingling and numbness:  Continue gabapentin -300 mg TID; and then at extra at night. - STABLE.   #Mild rash on the face-question Keytruda versus others.  Recommend hydrocortisone cream over-the-counter- STABLE.Marland Kitchen   # CAD/ PVD- cramping- recommend evaluation with vascular-in dec 2023  STABLE.  # COPD-stable encouraged continue to avoid smoking; again counseled to quit smoking; recommend evaluation with pulmonary. STABLE.  #Right mildly complex cystic adnexal mass noted- s/p prior pelvic ultrasound for further work-up [s/p Gyn-Onc; Dr.Berchuck; felt high risk for surgery]- monitor for now.   # #Incidental findings on Imaging  CT scan, 2023:..  Atherosclerosis arteriosclerosis hepatic steatosis emphysema I reviewed/discussed/counseled the patient.    # IV Access :s/p  port placement- STABLE.  *AM appts-  Monday-   # DISPOSITION: # keytruda today #Follow-up in 3 weeks-MD labs/port- cbc/cmp; Keytruda; Dr..B  # I reviewed the blood work- with the patient in detail; also reviewed the imaging independently [as summarized above]; and with the patient in detail.        All questions were answered. The patient knows to call the clinic with any problems, questions or concerns.    Cammie Sickle, MD 10/02/2022 9:49 AM

## 2022-10-02 NOTE — Patient Instructions (Signed)
[facility]  Discharge Instructions: Thank you for choosing [facility] to provide your oncology and hematology care.  If you have a lab appointment with the Cancer Center, please go directly to the Cancer Center and check in at the registration area.  Wear comfortable clothing and clothing appropriate for easy access to any Portacath or PICC line.   We strive to give you quality time with your provider. You may need to reschedule your appointment if you arrive late (15 or more minutes).  Arriving late affects you and other patients whose appointments are after yours.  Also, if you miss three or more appointments without notifying the office, you may be dismissed from the clinic at the provider's discretion.      For prescription refill requests, have your pharmacy contact our office and allow 72 hours for refills to be completed.    Today you received the following chemotherapy and/or immunotherapy agents Keytruda      To help prevent nausea and vomiting after your treatment, we encourage you to take your nausea medication as directed.  BELOW ARE SYMPTOMS THAT SHOULD BE REPORTED IMMEDIATELY: *FEVER GREATER THAN 100.4 F (38 C) OR HIGHER *CHILLS OR SWEATING *NAUSEA AND VOMITING THAT IS NOT CONTROLLED WITH YOUR NAUSEA MEDICATION *UNUSUAL SHORTNESS OF BREATH *UNUSUAL BRUISING OR BLEEDING *URINARY PROBLEMS (pain or burning when urinating, or frequent urination) *BOWEL PROBLEMS (unusual diarrhea, constipation, pain near the anus) TENDERNESS IN MOUTH AND THROAT WITH OR WITHOUT PRESENCE OF ULCERS (sore throat, sores in mouth, or a toothache) UNUSUAL RASH, SWELLING OR PAIN  UNUSUAL VAGINAL DISCHARGE OR ITCHING   Items with * indicate a potential emergency and should be followed up as soon as possible or go to the Emergency Department if any problems should occur.  Please show the CHEMOTHERAPY ALERT CARD or IMMUNOTHERAPY ALERT CARD at check-in to  the Emergency Department and triage nurse.  Should you have questions after your visit or need to cancel or reschedule your appointment, please contact [facility]  [phone] and follow the prompts.  Office hours are 8:00 a.m. to 4:30 p.m. Monday - Friday. Please note that voicemails left after 4:00 p.m. may not be returned until the following business day.  We are closed weekends and major holidays. You have access to a nurse at all times for urgent questions. Please call the main number to the clinic [phone] and follow the prompts.  For any non-urgent questions, you may also contact your provider using MyChart. We now offer e-Visits for anyone 18 and older to request care online for non-urgent symptoms. For details visit [email or web address].   Also download the MyChart app! Go to the app store, search "MyChart", open the app, select [facility], and log in with your MyChart username and password.  Masks are optional in the cancer centers. If you would like for your care team to wear a mask while they are taking care of you, please let them know. For doctor visits, patients may have with them one support person who is at least 65 years old. At this time, visitors are not allowed in the infusion area.   

## 2022-10-02 NOTE — Assessment & Plan Note (Addendum)
#  JUNE 2023-STAGE IV--recurrent/metastatic disease with New left adrenal metastasis;  PET scan JUNE 20th- 2023-  Enlarged 4 cm hypermetabolic LEFT adrenal metastasis;  Suspicion of metastatic adenopathy to the LEFT hilum.  Part solid nodule in the RIGHT lower lobe without metabolic activity. Findings are suspicious for indolent adenocarcinoma.  One- PFL1 =80% [primary LUNG mass];  JULY 2023- S/p Biopsy of adrenal nodule- Biopsied POSITIVE for CK7 positive adenocarcinoma [QNS for NGS]. Currently on single agent Keytruda [PD-L1 greater than 80% ] ;   # OCT 24th, 2023-  Interval response to therapy as evidenced by marked decrease in size of a left adrenal metastasis. Ground-glass nodules in the right lower lobe, 1 of which may have a developing internal solid component. Recommend continued attention on follow-up as adenocarcinoma is considered.  # Proceed with cycle #5 of Keytruda single agent... Labs today reviewed;  acceptable for treatment today. Tolearting well except for mild chills. Will plan re-imaging in 3 months or so.   # Chronic headaches/cervical pain/ visual blurriness- not new. On NSAIDs [; monitor for now. OCT 2023- MRI brain- NEGATIVE for any metastatic disease. Defer to opthalmology.   # Post throacotomy pain/tingling and numbness:  Continue gabapentin -300 mg TID; and then at extra at night. - STABLE.   #Mild rash on the face-question Keytruda versus others.  Recommend hydrocortisone cream over-the-counter- STABLE.Marland Kitchen   # CAD/ PVD- cramping- recommend evaluation with vascular-in dec 2023  STABLE.  # COPD-stable encouraged continue to avoid smoking; again counseled to quit smoking; recommend evaluation with pulmonary. STABLE.  #Right mildly complex cystic adnexal mass noted- s/p prior pelvic ultrasound for further work-up [s/p Gyn-Onc; Dr.Berchuck; felt high risk for surgery]- monitor for now.   # #Incidental findings on Imaging  CT scan, 2023:..  Atherosclerosis arteriosclerosis hepatic  steatosis emphysema I reviewed/discussed/counseled the patient.    # IV Access :s/p  port placement- STABLE.  *AM appts- Monday-   # DISPOSITION: # keytruda today #Follow-up in 3 weeks-MD labs/port- cbc/cmp; Keytruda; Dr..B  # I reviewed the blood work- with the patient in detail; also reviewed the imaging independently [as summarized above]; and with the patient in detail.

## 2022-10-09 ENCOUNTER — Encounter (INDEPENDENT_AMBULATORY_CARE_PROVIDER_SITE_OTHER): Payer: Medicare Other

## 2022-10-09 ENCOUNTER — Ambulatory Visit (INDEPENDENT_AMBULATORY_CARE_PROVIDER_SITE_OTHER): Payer: Medicare Other | Admitting: Vascular Surgery

## 2022-10-23 ENCOUNTER — Inpatient Hospital Stay: Payer: Medicare Other | Attending: Internal Medicine | Admitting: Oncology

## 2022-10-23 ENCOUNTER — Encounter: Payer: Self-pay | Admitting: Oncology

## 2022-10-23 ENCOUNTER — Inpatient Hospital Stay: Payer: Medicare Other

## 2022-10-23 VITALS — BP 117/76 | HR 69 | Temp 97.5°F | Resp 16 | Ht 68.0 in | Wt 218.0 lb

## 2022-10-23 DIAGNOSIS — C7972 Secondary malignant neoplasm of left adrenal gland: Secondary | ICD-10-CM | POA: Insufficient documentation

## 2022-10-23 DIAGNOSIS — C3432 Malignant neoplasm of lower lobe, left bronchus or lung: Secondary | ICD-10-CM

## 2022-10-23 DIAGNOSIS — Z5112 Encounter for antineoplastic immunotherapy: Secondary | ICD-10-CM | POA: Diagnosis not present

## 2022-10-23 DIAGNOSIS — Z79899 Other long term (current) drug therapy: Secondary | ICD-10-CM | POA: Insufficient documentation

## 2022-10-23 LAB — COMPREHENSIVE METABOLIC PANEL
ALT: 18 U/L (ref 0–44)
AST: 27 U/L (ref 15–41)
Albumin: 4 g/dL (ref 3.5–5.0)
Alkaline Phosphatase: 86 U/L (ref 38–126)
Anion gap: 7 (ref 5–15)
BUN: 14 mg/dL (ref 8–23)
CO2: 24 mmol/L (ref 22–32)
Calcium: 9.5 mg/dL (ref 8.9–10.3)
Chloride: 106 mmol/L (ref 98–111)
Creatinine, Ser: 0.59 mg/dL (ref 0.44–1.00)
GFR, Estimated: >60 mL/min (ref >60)
Glucose, Bld: 101 mg/dL — ABNORMAL HIGH (ref 70–99)
Potassium: 3.6 mmol/L (ref 3.5–5.1)
Sodium: 137 mmol/L (ref 135–145)
Total Bilirubin: 0.8 mg/dL (ref 0.3–1.2)
Total Protein: 7.5 g/dL (ref 6.5–8.1)

## 2022-10-23 LAB — CBC WITH DIFFERENTIAL/PLATELET
Abs Immature Granulocytes: 0.02 10*3/uL (ref 0.00–0.07)
Basophils Absolute: 0.1 10*3/uL (ref 0.0–0.1)
Basophils Relative: 1 %
Eosinophils Absolute: 0.4 10*3/uL (ref 0.0–0.5)
Eosinophils Relative: 4 %
HCT: 47.7 % — ABNORMAL HIGH (ref 36.0–46.0)
Hemoglobin: 16 g/dL — ABNORMAL HIGH (ref 12.0–15.0)
Immature Granulocytes: 0 %
Lymphocytes Relative: 39 %
Lymphs Abs: 3.5 10*3/uL (ref 0.7–4.0)
MCH: 28.2 pg (ref 26.0–34.0)
MCHC: 33.5 g/dL (ref 30.0–36.0)
MCV: 84 fL (ref 80.0–100.0)
Monocytes Absolute: 0.6 10*3/uL (ref 0.1–1.0)
Monocytes Relative: 6 %
Neutro Abs: 4.5 10*3/uL (ref 1.7–7.7)
Neutrophils Relative %: 50 %
Platelets: 214 10*3/uL (ref 150–400)
RBC: 5.68 MIL/uL — ABNORMAL HIGH (ref 3.87–5.11)
RDW: 14.6 % (ref 11.5–15.5)
WBC: 9 10*3/uL (ref 4.0–10.5)
nRBC: 0 % (ref 0.0–0.2)

## 2022-10-23 LAB — TSH: TSH: 2.16 u[IU]/mL (ref 0.350–4.500)

## 2022-10-23 MED ORDER — HEPARIN SOD (PORK) LOCK FLUSH 100 UNIT/ML IV SOLN
500.0000 [IU] | Freq: Once | INTRAVENOUS | Status: AC | PRN
  Administered 2022-10-23: 500 [IU]
  Filled 2022-10-23: qty 5

## 2022-10-23 MED ORDER — HEPARIN SOD (PORK) LOCK FLUSH 100 UNIT/ML IV SOLN
INTRAVENOUS | Status: AC
  Filled 2022-10-23: qty 5

## 2022-10-23 MED ORDER — SODIUM CHLORIDE 0.9% FLUSH
10.0000 mL | INTRAVENOUS | Status: DC | PRN
  Administered 2022-10-23: 10 mL via INTRAVENOUS
  Filled 2022-10-23: qty 10

## 2022-10-23 MED ORDER — SODIUM CHLORIDE 0.9 % IV SOLN
Freq: Once | INTRAVENOUS | Status: AC
  Filled 2022-10-23: qty 250

## 2022-10-23 MED ORDER — SODIUM CHLORIDE 0.9 % IV SOLN
200.0000 mg | Freq: Once | INTRAVENOUS | Status: AC
  Administered 2022-10-23: 200 mg via INTRAVENOUS
  Filled 2022-10-23: qty 8

## 2022-10-23 MED ORDER — SODIUM CHLORIDE 0.9% FLUSH
10.0000 mL | INTRAVENOUS | Status: DC | PRN
  Filled 2022-10-23: qty 10

## 2022-10-23 NOTE — Progress Notes (Signed)
[facility]  [phone] [phone]  ID: Laura Nielsen OB: 19-Jan-1956  MR#: [id number]  VZC#:[id number]  Patient Care Team: Birdie Sons, MD as PCP - General (Family Medicine) Ubaldo Glassing Javier Docker, MD as Consulting Physician (Cardiology) Schnier, Dolores Lory, MD (Vascular Surgery) Vladimir Crofts, MD as Consulting Physician (Neurology) Milinda Pointer, MD as Referring Physician (Pain Medicine) Pa, [place] (Optometry) Ubaldo Glassing, Javier Docker, MD as Consulting Physician (Cardiology) Virgel Manifold, MD (Inactive) as Consulting Physician (Gastroenterology) Gae Dry, MD as Referring Physician (Obstetrics and Gynecology) Telford Nab, RN as Oncology Nurse Navigator Cammie Sickle, MD as Consulting Physician (Oncology)  CHIEF COMPLAINT: Stage IV carcinoma of the left lower lobe lung.  INTERVAL HISTORY: Patient returns to clinic today for further evaluation and consideration of cycle 6 of single agent Keytruda.  She currently feels well and is asymptomatic.  She is tolerating her treatments without significant side effects.  She has no neurologic complaints.  She denies any recent fevers or illnesses.  She has a good appetite and denies weight loss.  She has no chest pain, shortness of breath, cough, or hemoptysis.  She denies any nausea, vomiting, constipation, or diarrhea.  She has no urinary.  Patient offers no specific complaints today.  REVIEW OF SYSTEMS:   Review of Systems  Constitutional: Negative.  Negative for fever, malaise/fatigue and weight loss.  Respiratory: Negative.  Negative for cough, hemoptysis and shortness of breath.   Cardiovascular: Negative.  Negative for chest pain and leg swelling.  Gastrointestinal: Negative.  Negative for abdominal pain.  Genitourinary: Negative.  Negative for dysuria.  Musculoskeletal: Negative.  Negative for back pain.  Skin: Negative.  Negative for rash.  Neurological: Negative.   Negative for dizziness, focal weakness, weakness and headaches.  Psychiatric/Behavioral: Negative.  The patient is not nervous/anxious.     As per HPI. Otherwise, a complete review of systems is negative.  PAST MEDICAL HISTORY: Past Medical History:  Diagnosis Date   AAA (abdominal aortic aneurysm) ([place])    s/p EVAR AAA 03/27/13   Arthritis    Asthma    Complication of anesthesia    BP drops after   History of kidney stones    Osteoporosis    Sleep apnea     PAST SURGICAL HISTORY: Past Surgical History:  Procedure Laterality Date   ABDOMINAL AORTIC ENDOVASCULAR STENT GRAFT  03/27/2013   Dr. Hortencia Pilar   Cardiac catheterization  02/2009   70-80% stenosis RCA stent placed. started on Plavix   CARDIAC CATHETERIZATION     COLONOSCOPY WITH PROPOFOL N/A 01/05/2021   Procedure: COLONOSCOPY WITH PROPOFOL;  Surgeon: Virgel Manifold, MD;  Location: [facility] ENDOSCOPY;  Service: Endoscopy;  Laterality: N/A;   CORONARY ANGIOPLASTY     stent placed   ESOPHAGOGASTRODUODENOSCOPY (EGD) WITH PROPOFOL N/A 01/05/2021   Procedure: ESOPHAGOGASTRODUODENOSCOPY (EGD) WITH PROPOFOL;  Surgeon: Virgel Manifold, MD;  Location: [facility] ENDOSCOPY;  Service: Endoscopy;  Laterality: N/A;   INTERCOSTAL NERVE BLOCK Left 04/06/2021   Procedure: INTERCOSTAL NERVE BLOCK;  Surgeon: Melrose Nakayama, MD;  Location: [place];  Service: Thoracic;  Laterality: Left;   IR IMAGING GUIDED PORT INSERTION  06/20/2022   KIDNEY STONE SURGERY  1999   LUNG LOBECTOMY Left 04/06/2021   robotic left lower lobectomy 04/06/2021 Dr. Roxan Hockey for Stage 1A adenocarcinoma   NODE DISSECTION Left 04/06/2021   Procedure: NODE DISSECTION;  Surgeon: Melrose Nakayama, MD;  Location: [facility];  Service: Thoracic;  Laterality: Left;  TUBAL LIGATION     VAGINAL HYSTERECTOMY     Menometrorrhagia. Excessive bleeding. Unknown if cervix removed.     FAMILY HISTORY: Family History  Problem Relation Age of Onset   Hypertension  Mother    Coronary artery disease Mother    Heart attack Mother        acute   Cancer Mother    Alcohol abuse Father    Depression Father    Hypertension Father    Heart attack Father 72       acute   Alcohol abuse Sister    Hyperlipidemia Sister    Hypertension Sister    Cancer Sister 9   Heart attack Sister        x's 2   Coronary artery disease Sister 45       x's 2   Breast cancer Sister 98    ADVANCED DIRECTIVES (Y/N):  N  HEALTH MAINTENANCE: Social History   Tobacco Use   Smoking status: Former    Packs/day: 0.25    Years: 44.50    Total pack years: 11.13    Types: Cigarettes    Quit date: 04/05/2021    Years since quitting: 1.5   Smokeless tobacco: Never   Tobacco comments:    12/23/19 states she quit for 3 months and then started back. Pt is going to talk with MD about this.  Vaping Use   Vaping Use: Never used  Substance Use Topics   Alcohol use: Yes    Comment: rarely - once a year   Drug use: No     Colonoscopy:  PAP:  Bone density:  Lipid panel:  Allergies  Allergen Reactions   Atorvastatin Other (See Comments)    Elevated blood sugar    Omeprazole Nausea And Vomiting   Bupropion Nausea Only    Only on the 150mg  tablets, but the 100mg  didn't help with smoking    Current Outpatient Medications  Medication Sig Dispense Refill   allopurinol (ZYLOPRIM) 100 MG tablet Take 1 tablet (100 mg total) by mouth daily. 90 tablet 3   aspirin 81 MG tablet Take 81 mg by mouth daily.      bisoprolol (ZEBETA) 10 MG tablet Take 1 tablet (10 mg total) by mouth daily. 90 tablet 4   Cholecalciferol 25 MCG (1000 UT) capsule Take 1,000 Units by mouth daily.     clopidogrel (PLAVIX) 75 MG tablet Take 1 tablet (75 mg total) by mouth daily. 90 tablet 4   cyanocobalamin 1000 MCG tablet Take 1,000 mcg by mouth daily.     gabapentin (NEURONTIN) 300 MG capsule Take 1 capsule (300 mg total) by mouth 3 (three) times daily AND 2 capsules (600 mg total) at bedtime.      ibuprofen (ADVIL) 200 MG tablet Take 400 mg by mouth 2 (two) times daily as needed (headaches).     lidocaine-prilocaine (EMLA) cream Apply on the port. 30 -45 min  prior to port access. 30 g 3   lisinopril-hydrochlorothiazide (ZESTORETIC) 20-12.5 MG tablet Take 1 tablet by mouth daily. 90 tablet 4   OZEMPIC, 1 MG/DOSE, 4 MG/3ML SOPN Inject 1 mg into the skin once a week. 3 mL 4   pantoprazole (PROTONIX) 20 MG tablet Take 1 tablet (20 mg total) by mouth daily. 90 tablet 4   PROAIR HFA 108 (90 Base) MCG/ACT inhaler Inhale 2 puffs into the lungs every 6 (six) hours as needed for wheezing or shortness of breath. 8.5 g 4   rosuvastatin (CRESTOR)  20 MG tablet Take 1 tablet (20 mg total) by mouth daily. 90 tablet 4   azelastine (ASTELIN) 0.1 % nasal spray Place 2 sprays into both nostrils 2 (two) times daily. Use in each nostril as directed (Patient not taking: Reported on 09/11/2022) 30 mL 2   fluticasone (FLONASE) 50 MCG/ACT nasal spray Place into both nostrils daily. (Patient not taking: Reported on 09/11/2022)     montelukast (SINGULAIR) 10 MG tablet Take 1 tablet (10 mg total) by mouth at bedtime. (Patient not taking: Reported on 10/23/2022) 30 tablet 3   No current facility-administered medications for this visit.   Facility-Administered Medications Ordered in Other Visits  Medication Dose Route Frequency Provider Last Rate Last Admin   heparin lock flush 100 UNIT/ML injection            sodium chloride flush (NS) 0.9 % injection 10 mL  10 mL Intravenous PRN Lloyd Huger, MD   10 mL at 10/23/22 1021   sodium chloride flush (NS) 0.9 % injection 10 mL  10 mL Intracatheter PRN Cammie Sickle, MD        OBJECTIVE: Vitals:   10/23/22 1045  BP: 117/76  Pulse: 69  Resp: 16  Temp: (!) 97.5 F (36.4 C)  SpO2: 99%     Body mass index is 33.15 kg/m.    ECOG FS:0 - Asymptomatic  General: Well-developed, well-nourished, no acute distress. Eyes: Pink conjunctiva, anicteric  sclera. HEENT: Normocephalic, moist mucous membranes. Lungs: No audible wheezing or coughing. Heart: Regular rate and rhythm. Abdomen: Soft, nontender, no obvious distention. Musculoskeletal: No edema, cyanosis, or clubbing. Neuro: Alert, answering all questions appropriately. Cranial nerves grossly intact. Skin: No rashes or petechiae noted. Psych: Normal affect.   LAB RESULTS:  Lab Results  Component Value Date   NA 137 10/23/2022   K 3.6 10/23/2022   CL 106 10/23/2022   CO2 24 10/23/2022   GLUCOSE 101 (H) 10/23/2022   BUN 14 10/23/2022   CREATININE 0.59 10/23/2022   CALCIUM 9.5 10/23/2022   PROT 7.5 10/23/2022   ALBUMIN 4.0 10/23/2022   AST 27 10/23/2022   ALT 18 10/23/2022   ALKPHOS 86 10/23/2022   BILITOT 0.8 10/23/2022   GFRNONAA >60 10/23/2022   GFRAA 93 01/25/2021    Lab Results  Component Value Date   WBC 9.0 10/23/2022   NEUTROABS 4.5 10/23/2022   HGB 16.0 (H) 10/23/2022   HCT 47.7 (H) 10/23/2022   MCV 84.0 10/23/2022   PLT 214 10/23/2022     STUDIES: MR BRAIN W WO CONTRAST  Result Date: 09/30/2022 CLINICAL DATA:  Non-small cell lung carcinoma staging.  Headaches. EXAM: MRI HEAD WITHOUT AND WITH CONTRAST TECHNIQUE: Multiplanar, multiecho pulse sequences of the brain and surrounding structures were obtained without and with intravenous contrast. CONTRAST:  48mL GADAVIST GADOBUTROL 1 MMOL/ML IV SOLN COMPARISON:  11/14/2019 FINDINGS: Brain: No acute infarct, mass effect or extra-axial collection. No acute or chronic hemorrhage. There is multifocal hyperintense T2-weighted signal within the white matter. Generalized volume loss. The midline structures are normal. There is no abnormal contrast enhancement. Vascular: Major flow voids are preserved. Skull and upper cervical spine: Normal calvarium and skull base. Visualized upper cervical spine and soft tissues are normal. Sinuses/Orbits:No paranasal sinus fluid levels or advanced mucosal thickening. No mastoid or  middle ear effusion. Normal orbits. IMPRESSION: 1. No intracranial metastatic disease. 2. Findings of chronic small vessel ischemia and volume loss. Electronically Signed   By: Ulyses Jarred M.D.   On: 09/30/2022  18:56   CT CHEST ABDOMEN PELVIS W CONTRAST  Result Date: 09/26/2022 CLINICAL DATA:  Non-small cell lung cancer, assess treatment response. * Tracking Code: BO * EXAM: CT CHEST, ABDOMEN, AND PELVIS WITH CONTRAST TECHNIQUE: Multidetector CT imaging of the chest, abdomen and pelvis was performed following the standard protocol during bolus administration of intravenous contrast. RADIATION DOSE REDUCTION: This exam was performed according to the departmental dose-optimization program which includes automated exposure control, adjustment of the mA and/or kV according to patient size and/or use of iterative reconstruction technique. CONTRAST:  19mL OMNIPAQUE IOHEXOL 300 MG/ML  SOLN COMPARISON:  PET 05/23/2022, 08/11/2020 and CT chest 05/08/2022. FINDINGS: CT CHEST FINDINGS Cardiovascular: Right IJ Port-A-Cath terminates at the SVC RA junction. Atherosclerotic calcification of the aorta and coronary arteries. Heart size normal. Small amount of dependent pericardial fluid appears similar. Mediastinum/Nodes: Mediastinal lymph nodes are not enlarged by CT size criteria. Hilar lymph nodes measure up to 7 mm on the left. No axillary adenopathy. Esophagus is grossly unremarkable. Lungs/Pleura: Centrilobular emphysema. Residual smoking related respiratory bronchiolitis. Ground-glass nodules in the right lower lobe measure up to approximately 1.6 x 2.1 cm (4/81) with a possible developing 5 mm solid component (4/80). Bibasilar scarring. No pleural fluid. Left lower lobectomy. Musculoskeletal: Degenerative changes in the spine. Grade 1 anterolisthesis of C7 on T1. No worrisome lytic or sclerotic lesions. CT ABDOMEN PELVIS FINDINGS Hepatobiliary: Liver is slightly decreased in attenuation diffusely. Liver and  gallbladder are otherwise unremarkable. No biliary ductal dilatation. Pancreas: Negative. Spleen: Negative. Adrenals/Urinary Tract: Right adrenal gland is unremarkable. Residual left adrenal nodule measures 1.1 x 1.7 cm, decreased in size from 3.6 x 3.8 cm on 05/08/2022. 2.1 cm cyst in the lower pole right kidney. No specific follow-up necessary. Scarring in the right kidney. Kidneys are otherwise unremarkable. Ureters are decompressed. Bladder is relatively low in volume. Stomach/Bowel: Stomach, small bowel and colon are unremarkable. Appendix is not readily visualized. Vascular/Lymphatic: Atherosclerotic calcification of the aorta. Aorto bi-iliac endograft stent. No pathologically enlarged lymph nodes. Reproductive: Hysterectomy. Septated cystic mass in the right adnexa measures 5.1 x 6.5 cm (2/109), previously 5.0 x 5.8 cm on 08/11/2020. Other: No free fluid.  Mesenteries and peritoneum are unremarkable. Musculoskeletal: Degenerative changes in the spine. No worrisome lytic or sclerotic lesions. IMPRESSION: 1. Interval response to therapy as evidenced by marked decrease in size of a left adrenal metastasis. 2. Ground-glass nodules in the right lower lobe, 1 of which may have a developing internal solid component. Recommend continued attention on follow-up as adenocarcinoma is considered. 3. Mildly complex cystic right adnexal mass, slightly increased in size from remote prior exam 08/11/2020. Because this lesion is not adequately characterized, prompt [place] is recommended for further evaluation. Note: This recommendation does not apply to premenarchal patients and to those with increased risk (genetic, family history, elevated tumor markers or other high-risk factors) of ovarian cancer. Reference: JACR 2020 Feb; 17(2):248-254 4. Hepatic steatosis. 5. Aortic atherosclerosis (ICD10-I70.0). Coronary artery calcification. 6.  Emphysema (ICD10-J43.9). Electronically Signed   By: Lorin Picket M.D.   On: 09/26/2022  14:15    ASSESSMENT: Stage IV carcinoma of the left lower lobe lung.  PLAN:    Stage IV carcinoma of the left lower lobe lung: CT scan results from September 26, 2022 reviewed independently and report as above with interval response to therapy.  Proceed with cycle 6 of Keytruda today.  Return to clinic in 3 weeks for further evaluation and consideration of cycle 7. Headache: Patient does not complain of this  today.  MRI in October 2023 was negative for metastatic disease. Pain: Patient does not complain of today.  Continue gabapentin as prescribed.  I spent a total of 30 minutes reviewing chart data, face-to-face evaluation with the patient, counseling and coordination of care as detailed above.   Patient expressed understanding and was in agreement with this plan. She also understands that She can call clinic at any time with any questions, concerns, or complaints.    Cancer Staging  No matching staging information was found for the patient.  Lloyd Huger, MD   10/23/2022 2:33 PM

## 2022-10-23 NOTE — Patient Instructions (Signed)
[facility] AT [facility]  Discharge Instructions: Thank you for choosing [facility] to provide your oncology and hematology care.  If you have a lab appointment with the [facility], please go directly to the [facility] and check in at the registration area.  Wear comfortable clothing and clothing appropriate for easy access to any Portacath or PICC line.   We strive to give you quality time with your provider. You may need to reschedule your appointment if you arrive late (15 or more minutes).  Arriving late affects you and other patients whose appointments are after yours.  Also, if you miss three or more appointments without notifying the office, you may be dismissed from the clinic at the provider's discretion.      For prescription refill requests, have your pharmacy contact our office and allow 72 hours for refills to be completed.    Today you received the following chemotherapy and/or immunotherapy agentsk Keytruda      To help prevent nausea and vomiting after your treatment, we encourage you to take your nausea medication as directed.  BELOW ARE SYMPTOMS THAT SHOULD BE REPORTED IMMEDIATELY: *FEVER GREATER THAN 100.4 F (38 C) OR HIGHER *CHILLS OR SWEATING *NAUSEA AND VOMITING THAT IS NOT CONTROLLED WITH YOUR NAUSEA MEDICATION *UNUSUAL SHORTNESS OF BREATH *UNUSUAL BRUISING OR BLEEDING *URINARY PROBLEMS (pain or burning when urinating, or frequent urination) *BOWEL PROBLEMS (unusual diarrhea, constipation, pain near the anus) TENDERNESS IN MOUTH AND THROAT WITH OR WITHOUT PRESENCE OF ULCERS (sore throat, sores in mouth, or a toothache) UNUSUAL RASH, SWELLING OR PAIN  UNUSUAL VAGINAL DISCHARGE OR ITCHING   Items with * indicate a potential emergency and should be followed up as soon as possible or go to the Emergency Department if any problems should occur.  Please show the CHEMOTHERAPY ALERT CARD or IMMUNOTHERAPY ALERT CARD at check-in to  the Emergency Department and triage nurse.  Should you have questions after your visit or need to cancel or reschedule your appointment, please contact [facility] CANC[facility] [place] AT [place]  [phone] and follow the prompts.  Office hours are 8:00 a.m. to 4:30 p.m. Monday - Friday. Please note that voicemails left after 4:00 p.m. may not be returned until the following business day.  We are closed weekends and major holidays. You have access to a nurse at all times for urgent questions. Please call the main number to the clinic [phone] and follow the prompts.  For any non-urgent questions, you may also contact your provider using MyChart. We now offer e-Visits for anyone 89 and older to request care online for non-urgent symptoms. For details visit [email or web address].   Also download the MyChart app! Go to the app store, search "MyChart", open the app, select [facility], and log in with your MyChart username and password.  Masks are optional in the cancer centers. If you would like for your care team to wear a mask while they are taking care of you, please let them know. For doctor visits, patients may have with them one support person who is at least 65 years old. At this time, visitors are not allowed in the infusion area.

## 2022-10-24 ENCOUNTER — Inpatient Hospital Stay: Payer: Medicare Other

## 2022-10-24 LAB — T4: T4, Total: 8 ug/dL (ref 4.5–12.0)

## 2022-10-24 NOTE — Progress Notes (Signed)
Nutrition Follow-up:  Patient with lung cancer and adrenal lesion.  Patient receiving [place].    Spoke with patient via phone.  Patient reports that her appetite is about the same.  Looking forward to Thanksgiving.  Some days eats more than others.  Having trouble finding flavorful fruits this time of year.     Medications: reviewed  Labs: reviewed  Anthropometrics:   Weight 218 lb on 11/20  219 lb on 10/9 221 lb on 8/28 225 lb on 8/11 239 lb on 12/29/21   NUTRITION DIAGNOSIS: Inadequate oral intake stable with stable weight    INTERVENTION:  Continue well balanced diet including good sources of protein for weight maintenance during treatment. RD available if needed in the future    NEXT VISIT: no follow-up RD available if changes occur  Laura Nielsen, [place], [place] Registered Dietitian [phone]

## 2022-11-13 ENCOUNTER — Inpatient Hospital Stay: Payer: Medicare Other | Attending: Internal Medicine

## 2022-11-13 ENCOUNTER — Encounter: Payer: Self-pay | Admitting: Internal Medicine

## 2022-11-13 ENCOUNTER — Ambulatory Visit: Payer: Medicare Other | Admitting: Oncology

## 2022-11-13 ENCOUNTER — Inpatient Hospital Stay: Payer: Medicare Other

## 2022-11-13 ENCOUNTER — Inpatient Hospital Stay (HOSPITAL_BASED_OUTPATIENT_CLINIC_OR_DEPARTMENT_OTHER): Payer: Medicare Other | Admitting: Internal Medicine

## 2022-11-13 ENCOUNTER — Ambulatory Visit: Payer: Medicare Other

## 2022-11-13 VITALS — BP 94/69 | HR 72 | Temp 96.0°F | Ht 68.0 in | Wt 220.0 lb

## 2022-11-13 DIAGNOSIS — C7972 Secondary malignant neoplasm of left adrenal gland: Secondary | ICD-10-CM | POA: Insufficient documentation

## 2022-11-13 DIAGNOSIS — C3432 Malignant neoplasm of lower lobe, left bronchus or lung: Secondary | ICD-10-CM

## 2022-11-13 DIAGNOSIS — Z5112 Encounter for antineoplastic immunotherapy: Secondary | ICD-10-CM | POA: Insufficient documentation

## 2022-11-13 DIAGNOSIS — Z87891 Personal history of nicotine dependence: Secondary | ICD-10-CM | POA: Insufficient documentation

## 2022-11-13 DIAGNOSIS — Z79899 Other long term (current) drug therapy: Secondary | ICD-10-CM | POA: Diagnosis not present

## 2022-11-13 LAB — COMPREHENSIVE METABOLIC PANEL
ALT: 18 U/L (ref 0–44)
AST: 23 U/L (ref 15–41)
Albumin: 3.9 g/dL (ref 3.5–5.0)
Alkaline Phosphatase: 87 U/L (ref 38–126)
Anion gap: 11 (ref 5–15)
BUN: 20 mg/dL (ref 8–23)
CO2: 25 mmol/L (ref 22–32)
Calcium: 9.3 mg/dL (ref 8.9–10.3)
Chloride: 105 mmol/L (ref 98–111)
Creatinine, Ser: 0.56 mg/dL (ref 0.44–1.00)
GFR, Estimated: >60 mL/min (ref >60)
Glucose, Bld: 100 mg/dL — ABNORMAL HIGH (ref 70–99)
Potassium: 3.5 mmol/L (ref 3.5–5.1)
Sodium: 141 mmol/L (ref 135–145)
Total Bilirubin: 0.8 mg/dL (ref 0.3–1.2)
Total Protein: 6.9 g/dL (ref 6.5–8.1)

## 2022-11-13 LAB — CBC WITH DIFFERENTIAL/PLATELET
Abs Immature Granulocytes: 0.02 10*3/uL (ref 0.00–0.07)
Basophils Absolute: 0.1 10*3/uL (ref 0.0–0.1)
Basophils Relative: 1 %
Eosinophils Absolute: 0.5 10*3/uL (ref 0.0–0.5)
Eosinophils Relative: 5 %
HCT: 45.5 % (ref 36.0–46.0)
Hemoglobin: 15.3 g/dL — ABNORMAL HIGH (ref 12.0–15.0)
Immature Granulocytes: 0 %
Lymphocytes Relative: 38 %
Lymphs Abs: 3.7 10*3/uL (ref 0.7–4.0)
MCH: 28.4 pg (ref 26.0–34.0)
MCHC: 33.6 g/dL (ref 30.0–36.0)
MCV: 84.6 fL (ref 80.0–100.0)
Monocytes Absolute: 0.6 10*3/uL (ref 0.1–1.0)
Monocytes Relative: 6 %
Neutro Abs: 4.9 10*3/uL (ref 1.7–7.7)
Neutrophils Relative %: 50 %
Platelets: 208 10*3/uL (ref 150–400)
RBC: 5.38 MIL/uL — ABNORMAL HIGH (ref 3.87–5.11)
RDW: 14.3 % (ref 11.5–15.5)
WBC: 9.7 10*3/uL (ref 4.0–10.5)
nRBC: 0 % (ref 0.0–0.2)

## 2022-11-13 MED ORDER — HEPARIN SOD (PORK) LOCK FLUSH 100 UNIT/ML IV SOLN
500.0000 [IU] | Freq: Once | INTRAVENOUS | Status: AC | PRN
  Administered 2022-11-13: 500 [IU]
  Filled 2022-11-13: qty 5

## 2022-11-13 MED ORDER — SODIUM CHLORIDE 0.9% FLUSH
10.0000 mL | INTRAVENOUS | Status: DC | PRN
  Administered 2022-11-13: 10 mL
  Filled 2022-11-13: qty 10

## 2022-11-13 MED ORDER — HEPARIN SOD (PORK) LOCK FLUSH 100 UNIT/ML IV SOLN
500.0000 [IU] | Freq: Once | INTRAVENOUS | Status: DC
  Filled 2022-11-13: qty 5

## 2022-11-13 MED ORDER — SODIUM CHLORIDE 0.9 % IV SOLN
Freq: Once | INTRAVENOUS | Status: AC
  Filled 2022-11-13: qty 250

## 2022-11-13 MED ORDER — SODIUM CHLORIDE 0.9 % IV SOLN
200.0000 mg | Freq: Once | INTRAVENOUS | Status: AC
  Administered 2022-11-13: 200 mg via INTRAVENOUS
  Filled 2022-11-13: qty 8

## 2022-11-13 MED ORDER — SODIUM CHLORIDE 0.9% FLUSH
10.0000 mL | Freq: Once | INTRAVENOUS | Status: AC
  Administered 2022-11-13: 10 mL via INTRAVENOUS
  Filled 2022-11-13: qty 10

## 2022-11-13 NOTE — Assessment & Plan Note (Addendum)
#  JUNE 2023-STAGE IV--recurrent/metastatic disease with New left adrenal metastasis;  PET scan JUNE 20th- 2023-  Enlarged 4 cm hypermetabolic LEFT adrenal metastasis;  Suspicion of metastatic adenopathy to the LEFT hilum.  Part solid nodule in the RIGHT lower lobe without metabolic activity. Findings are suspicious for indolent adenocarcinoma.  One- PFL1 =80% [primary LUNG mass];  JULY 2023- S/p Biopsy of adrenal nodule- Biopsied POSITIVE for CK7 positive adenocarcinoma [QNS for NGS]. Currently on single agent Keytruda [PD-L1 greater than 80% ] ;  OCT 24th, 2023-  Interval response to therapy as evidenced by marked decrease in size of a left adrenal metastasis. Ground-glass nodules in the right lower lobe, 1 of which may have a developing internal solid component.   # Proceed with cycle #5 of Keytruda single agent. Labs today reviewed;  acceptable for treatment today. Tolerating well except for mild chills. Will plan re-imaging in 3 months or so. Will repeat CT scan in JAN-FEB 2024.   # Chronic headaches/cervical pain/ visual blurriness- not new. On NSAIDs [; monitor for now. OCT 2023- MRI brain- NEGATIVE for any metastatic disease.  Declined referral to neurology/given previous extensive workup.  STABLE.   # Borderline Low BP- 95/60s- Ok to hold off Lisinopril-HCZT; continue Bisoprolol; and increase fluids.   # Post throacotomy pain/tingling and numbness:  Continue gabapentin -300 mg TID; and then at extra at night. STABLE.  # CAD/ PVD- cramping- recommend evaluation with vascular-in dec 2023  STABLE.  # COPD-stable encouraged continue to avoid smoking; again counseled to quit smoking; recommend evaluation with pulmonary. STABLE.  #Right mildly complex cystic adnexal mass noted- s/p prior pelvic ultrasound for further work-up [s/p Gyn-Onc; Dr.Berchuck; felt high risk for surgery]- monitor for now. STABLE.   # IV Access :s/p  port placement- STABLE.  *AM appts- Monday-   # DISPOSITION: # keytruda  today #Follow-up in 3 weeks-MD labs/port- cbc/cmp; Keytruda; Dr..B

## 2022-11-13 NOTE — Progress Notes (Signed)
Patient here for follow up. Patient complains of pain/burning sensation in left heal x 1 week. Pain is at a 3-4 per patient.

## 2022-11-13 NOTE — Patient Instructions (Signed)
[facility] AT [place]  Discharge Instructions: Thank you for choosing [facility] to provide your oncology and hematology care.  If you have a lab appointment with the [facility], please go directly to the [facility] and check in at the registration area.  Wear comfortable clothing and clothing appropriate for easy access to any Portacath or PICC line.   We strive to give you quality time with your provider. You may need to reschedule your appointment if you arrive late (15 or more minutes).  Arriving late affects you and other patients whose appointments are after yours.  Also, if you miss three or more appointments without notifying the office, you may be dismissed from the clinic at the provider's discretion.      For prescription refill requests, have your pharmacy contact our office and allow 72 hours for refills to be completed.    Today you received the following chemotherapy and/or immunotherapy agentsk Keytruda      To help prevent nausea and vomiting after your treatment, we encourage you to take your nausea medication as directed.  BELOW ARE SYMPTOMS THAT SHOULD BE REPORTED IMMEDIATELY: *FEVER GREATER THAN 100.4 F (38 C) OR HIGHER *CHILLS OR SWEATING *NAUSEA AND VOMITING THAT IS NOT CONTROLLED WITH YOUR NAUSEA MEDICATION *UNUSUAL SHORTNESS OF BREATH *UNUSUAL BRUISING OR BLEEDING *URINARY PROBLEMS (pain or burning when urinating, or frequent urination) *BOWEL PROBLEMS (unusual diarrhea, constipation, pain near the anus) TENDERNESS IN MOUTH AND THROAT WITH OR WITHOUT PRESENCE OF ULCERS (sore throat, sores in mouth, or a toothache) UNUSUAL RASH, SWELLING OR PAIN  UNUSUAL VAGINAL DISCHARGE OR ITCHING   Items with * indicate a potential emergency and should be followed up as soon as possible or go to the Emergency Department if any problems should occur.  Please show the CHEMOTHERAPY ALERT CARD or IMMUNOTHERAPY ALERT CARD at check-in to  the Emergency Department and triage nurse.  Should you have questions after your visit or need to cancel or reschedule your appointment, please contact [facility] [place] [facility] [employer]  [phone] and follow the prompts.  Office hours are 8:00 a.m. to 4:30 p.m. Monday - Friday. Please note that voicemails left after 4:00 p.m. may not be returned until the following business day.  We are closed weekends and major holidays. You have access to a nurse at all times for urgent questions. Please call the main number to the clinic [phone] and follow the prompts.  For any non-urgent questions, you may also contact your provider using MyChart. We now offer e-Visits for anyone 30 and older to request care online for non-urgent symptoms. For details visit [email or web address].   Also download the MyChart app! Go to the app store, search "MyChart", open the app, select [facility], and log in with your MyChart username and password.  Masks are optional in the cancer centers. If you would like for your care team to wear a mask while they are taking care of you, please let them know. For doctor visits, patients may have with them one support person who is at least 65 years old. At this time, visitors are not allowed in the infusion area.

## 2022-11-13 NOTE — Progress Notes (Signed)
[facility] NOTE  Patient Care Team: Birdie Sons, MD as PCP - General (Family Medicine) Ubaldo Glassing Javier Docker, MD as Consulting Physician (Cardiology) Schnier, Dolores Lory, MD (Vascular Surgery) Vladimir Crofts, MD as Consulting Physician (Neurology) Milinda Pointer, MD as Referring Physician (Pain Medicine) Pa, [place] (Optometry) Ubaldo Glassing, Javier Docker, MD as Consulting Physician (Cardiology) Virgel Manifold, MD (Inactive) as Consulting Physician (Gastroenterology) Gae Dry, MD as Referring Physician (Obstetrics and Gynecology) Telford Nab, RN as Oncology Nurse Navigator Cammie Sickle, MD as Consulting Physician (Oncology)   CHIEF COMPLAINTS/PURPOSE OF CONSULTATION: lung cancer  #  Oncology History Overview Note  #MAY 2022- LLL nodule 2.3 cm Adenocarcinoma Stage IA; s/p lobectomy.  No adjuvant therapy  # CT scan 4th June 2023-highly concerning for recurrent/metastatic disease with-. New left adrenal metastasis;  Ground-glass and part solid nodules in the peripheral right lower lobe, unchanged from 11/07/2021 but slightly enlarged from 01/01/2018. Findings are suspicious for indolent adenocarcinoma.  F One- PFL1 =80%; KRAS G12C PET scan JUNE 20th- 2023-  Enlarged 4 cm hypermetabolic LEFT adrenal metastasis;  Suspicion of metastatic adenopathy to the LEFT hilum.  Part solid nodule in the RIGHT lower lobe without metabolic activity.   3 ADRENAL BIOPSY- CK7 POSITIVE ADENOCARCINOMA- There is limited tissue remaining for ancillary testing, not likely  sufficient for NGS testing.   # AUG 7th, 2023-single agent Keytruda.    Primary cancer of left lower lobe of lung ([place])  05/09/2021 Initial Diagnosis   Primary cancer of left lower lobe of lung ([place])   07/04/2022 -  Chemotherapy   Patient is on Treatment Plan : LUNG NSCLC Pembrolizumab (200) q21d     07/10/2022 - 07/10/2022 Chemotherapy   Patient is on Treatment Plan : LUNG NSCLC Pembrolizumab  (200) q21d        HISTORY OF PRESENTING ILLNESS: Alone.  Ambulating independently.  Laura Nielsen 66 y.o.  female history of smoking; prior history of lung cancer-with likely recurrence to left adrenal gland [s/p Bx CK-7 positive TTF-1 negative]-clinically suggestive of recurrent lung cancer on single agent [name] is here for follow-up.   Patient here for follow up.  Continues to complain of chronic joint pains not any worse.  Denies any diarrhea.  Denies any abdominal pain. Otherwise patient continues to have mild shortness of breath on exertion.  Denies any headaches.  Denies any nausea vomiting.  Complains of cramping in the legs chronic.   Review of Systems  Constitutional:  Negative for chills, diaphoresis, fever, malaise/fatigue and weight loss.  HENT:  Negative for nosebleeds and sore throat.   Eyes:  Negative for double vision.  Respiratory:  Negative for cough, hemoptysis, sputum production, shortness of breath and wheezing.   Cardiovascular:  Positive for chest pain. Negative for palpitations, orthopnea and leg swelling.  Gastrointestinal:  Negative for abdominal pain, blood in stool, constipation, diarrhea, heartburn, melena, nausea and vomiting.  Genitourinary:  Negative for dysuria, frequency and urgency.  Musculoskeletal:  Positive for back pain and joint pain.  Skin: Negative.  Negative for itching and rash.  Neurological:  Positive for tingling. Negative for dizziness, focal weakness, weakness and headaches.  Endo/Heme/Allergies:  Does not bruise/bleed easily.  Psychiatric/Behavioral:  Negative for depression. The patient is not nervous/anxious and does not have insomnia.      MEDICAL HISTORY:  Past Medical History:  Diagnosis Date   AAA (abdominal aortic aneurysm) ([place])    s/p EVAR AAA 03/27/13   Arthritis    Asthma  Complication of anesthesia    BP drops after   History of kidney stones    Osteoporosis    Sleep apnea     SURGICAL HISTORY: Past Surgical  History:  Procedure Laterality Date   ABDOMINAL AORTIC ENDOVASCULAR STENT GRAFT  03/27/2013   Dr. Hortencia Pilar   Cardiac catheterization  02/2009   70-80% stenosis RCA stent placed. started on Plavix   CARDIAC CATHETERIZATION     COLONOSCOPY WITH PROPOFOL N/A 01/05/2021   Procedure: COLONOSCOPY WITH PROPOFOL;  Surgeon: Virgel Manifold, MD;  Location: [facility] ENDOSCOPY;  Service: Endoscopy;  Laterality: N/A;   CORONARY ANGIOPLASTY     stent placed   ESOPHAGOGASTRODUODENOSCOPY (EGD) WITH PROPOFOL N/A 01/05/2021   Procedure: ESOPHAGOGASTRODUODENOSCOPY (EGD) WITH PROPOFOL;  Surgeon: Virgel Manifold, MD;  Location: [facility] ENDOSCOPY;  Service: Endoscopy;  Laterality: N/A;   INTERCOSTAL NERVE BLOCK Left 04/06/2021   Procedure: INTERCOSTAL NERVE BLOCK;  Surgeon: Melrose Nakayama, MD;  Location: [facility];  Service: Thoracic;  Laterality: Left;   IR IMAGING GUIDED PORT INSERTION  06/20/2022   KIDNEY STONE SURGERY  1999   LUNG LOBECTOMY Left 04/06/2021   robotic left lower lobectomy 04/06/2021 Dr. Roxan Hockey for Stage 1A adenocarcinoma   NODE DISSECTION Left 04/06/2021   Procedure: NODE DISSECTION;  Surgeon: Melrose Nakayama, MD;  Location: [facility];  Service: Thoracic;  Laterality: Left;   TUBAL LIGATION     VAGINAL HYSTERECTOMY     Menometrorrhagia. Excessive bleeding. Unknown if cervix removed.     SOCIAL HISTORY: Social History   Socioeconomic History   Marital status: Divorced    Spouse name: Not on file   Number of children: 2   Years of education: H/S   Highest education level: High school graduate  Occupational History   Occupation: Disabled   Occupation: retired  Tobacco Use   Smoking status: Former    Packs/day: 0.25    Years: 44.50    Total pack years: 11.13    Types: Cigarettes    Quit date: 04/05/2021    Years since quitting: 1.6   Smokeless tobacco: Never   Tobacco comments:    12/23/19 states she quit for 3 months and then started back. Pt is going to talk  with MD about this.  Vaping Use   Vaping Use: Never used  Substance and Sexual Activity   Alcohol use: Yes    Comment: rarely - once a year   Drug use: No   Sexual activity: Not on file  Other Topics Concern   Not on file  Social History Narrative   Hx of smoking; quit prior to lung surgery. Lives in [place]- self. No alcohol. 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 On disability sec to spinal pain.    Social Determinants of Health   Financial Resource Strain: Low Risk  (02/10/2021)   Overall Financial Resource Strain (CARDIA)    Difficulty of Paying Living Expenses: Not hard at all  Food Insecurity: No Food Insecurity (03/21/2022)   Hunger Vital Sign    Worried About Running Out of Food in the Last Year: Never true    Ran Out of Food in the Last Year: Never true  Transportation Needs: No Transportation Needs (03/21/2022)   PRAPARE - Hydrologist (Medical): No    Lack of Transportation (Non-Medical): No  Physical Activity: Inactive (02/10/2021)   Exercise Vital Sign    Days of Exercise per Week: 0 days    Minutes of Exercise per  Session: 0 min  Stress: Stress Concern Present (02/10/2021)   [place]    Feeling of Stress : To some extent  Social Connections: Socially Isolated (02/10/2021)   Social Connection and Isolation Panel [NHANES]    Frequency of Communication with Friends and Family: More than three times a week    Frequency of Social Gatherings with Friends and Family: Once a week    Attends Religious Services: Never    Marine scientist or Organizations: No    Attends Archivist Meetings: Never    Marital Status: Divorced  Human resources officer Violence: Not At Risk (02/10/2021)   Humiliation, Afraid, Rape, and Kick questionnaire    Fear of Current or Ex-Partner: No    Emotionally Abused: No    Physically Abused: No    Sexually Abused: No    FAMILY  HISTORY: Family History  Problem Relation Age of Onset   Hypertension Mother    Coronary artery disease Mother    Heart attack Mother        acute   Cancer Mother    Alcohol abuse Father    Depression Father    Hypertension Father    Heart attack Father 68       acute   Alcohol abuse Sister    Hyperlipidemia Sister    Hypertension Sister    Cancer Sister 25   Heart attack Sister        x's 2   Coronary artery disease Sister 62       x's 2   Breast cancer Sister 19    ALLERGIES:  is allergic to atorvastatin, omeprazole, and bupropion.  MEDICATIONS:  Current Outpatient Medications  Medication Sig Dispense Refill   allopurinol (ZYLOPRIM) 100 MG tablet Take 1 tablet (100 mg total) by mouth daily. 90 tablet 3   aspirin 81 MG tablet Take 81 mg by mouth daily.      bisoprolol (ZEBETA) 10 MG tablet Take 1 tablet (10 mg total) by mouth daily. 90 tablet 4   Cholecalciferol 25 MCG (1000 UT) capsule Take 1,000 Units by mouth daily.     clopidogrel (PLAVIX) 75 MG tablet Take 1 tablet (75 mg total) by mouth daily. 90 tablet 4   cyanocobalamin 1000 MCG tablet Take 1,000 mcg by mouth daily.     gabapentin (NEURONTIN) 300 MG capsule Take 1 capsule (300 mg total) by mouth 3 (three) times daily AND 2 capsules (600 mg total) at bedtime.     ibuprofen (ADVIL) 200 MG tablet Take 400 mg by mouth 2 (two) times daily as needed (headaches).     lidocaine-prilocaine (EMLA) cream Apply on the port. 30 -45 min  prior to port access. 30 g 3   lisinopril-hydrochlorothiazide (ZESTORETIC) 20-12.5 MG tablet Take 1 tablet by mouth daily. 90 tablet 4   OZEMPIC, 1 MG/DOSE, 4 MG/3ML SOPN Inject 1 mg into the skin once a week. 3 mL 4   pantoprazole (PROTONIX) 20 MG tablet Take 1 tablet (20 mg total) by mouth daily. 90 tablet 4   PROAIR HFA 108 (90 Base) MCG/ACT inhaler Inhale 2 puffs into the lungs every 6 (six) hours as needed for wheezing or shortness of breath. 8.5 g 4   rosuvastatin (CRESTOR) 20 MG tablet  Take 1 tablet (20 mg total) by mouth daily. 90 tablet 4   azelastine (ASTELIN) 0.1 % nasal spray Place 2 sprays into both nostrils 2 (two) times daily. Use in each nostril  as directed (Patient not taking: Reported on 09/11/2022) 30 mL 2   fluticasone (FLONASE) 50 MCG/ACT nasal spray Place into both nostrils daily. (Patient not taking: Reported on 09/11/2022)     montelukast (SINGULAIR) 10 MG tablet Take 1 tablet (10 mg total) by mouth at bedtime. (Patient not taking: Reported on 10/23/2022) 30 tablet 3   No current facility-administered medications for this visit.   Facility-Administered Medications Ordered in Other Visits  Medication Dose Route Frequency Provider Last Rate Last Admin   heparin lock flush 100 UNIT/ML injection            heparin lock flush 100 unit/mL  500 Units Intravenous Once Charlaine Dalton R, MD       sodium chloride flush (NS) 0.9 % injection 10 mL  10 mL Intracatheter PRN Lloyd Huger, MD   10 mL at 11/13/22 1257    Mild maculopapular rash on the [place] area left more than right.  PHYSICAL EXAMINATION: ECOG PERFORMANCE STATUS: 1 - Symptomatic but completely ambulatory  Vitals:   11/13/22 1039  BP: 94/69  Pulse: 72  Temp: (!) 96 F (35.6 C)   Filed Weights   11/13/22 1039  Weight: 220 lb (99.8 kg)    Physical Exam HENT:     Head: Normocephalic and atraumatic.     Mouth/Throat:     Pharynx: No oropharyngeal exudate.  Eyes:     Pupils: Pupils are equal, round, and reactive to light.  Cardiovascular:     Rate and Rhythm: Normal rate and regular rhythm.  Pulmonary:     Comments: Decreased breath sounds bilaterally.  No wheeze or crackles Abdominal:     General: Bowel sounds are normal. There is no distension.     Palpations: Abdomen is soft. There is no mass.     Tenderness: There is no abdominal tenderness. There is no guarding or rebound.  Musculoskeletal:        General: No tenderness. Normal range of motion.     Cervical back: Normal  range of motion and neck supple.  Skin:    General: Skin is warm.  Neurological:     Mental Status: She is alert and oriented to person, place, and time.  Psychiatric:        Mood and Affect: Affect normal.      LABORATORY DATA:  I have reviewed the data as listed Lab Results  Component Value Date   WBC 9.7 11/13/2022   HGB 15.3 (H) 11/13/2022   HCT 45.5 11/13/2022   MCV 84.6 11/13/2022   PLT 208 11/13/2022   Recent Labs    10/02/22 0827 10/23/22 1016 11/13/22 1018  NA 137 137 141  K 3.5 3.6 3.5  CL 108 106 105  CO2 _0 GLUCOSE 106* 101* 100*  BUN _1 CREATININE 0.58 0.59 0.56  CALCIUM 9.0 9.5 9.3  GFRNONAA >60 >60 >60  PROT 7.0 7.5 6.9  ALBUMIN 3.8 4.0 3.9  AST _2 ALT _3 ALKPHOS 85 86 87  BILITOT 0.5 0.8 0.8    RADIOGRAPHIC STUDIES: I have personally reviewed the radiological images as listed and agreed with the findings in the report. No results found.   ASSESSMENT & PLAN:   Primary cancer of left lower lobe of lung ([place]) #JUNE 2023-STAGE IV--recurrent/metastatic disease with New left adrenal metastasis;  PET scan JUNE 20th- 2023-  Enlarged 4 cm hypermetabolic LEFT adrenal metastasis;  Suspicion of metastatic adenopathy to the LEFT  hilum.  Part solid nodule in the RIGHT lower lobe without metabolic activity. Findings are suspicious for indolent adenocarcinoma.  One- PFL1 =80% [primary LUNG mass];  JULY 2023- S/p Biopsy of adrenal nodule- Biopsied POSITIVE for CK7 positive adenocarcinoma [QNS for NGS]. Currently on single agent Keytruda [PD-L1 greater than 80% ] ;  OCT 24th, 2023-  Interval response to therapy as evidenced by marked decrease in size of a left adrenal metastasis. Ground-glass nodules in the right lower lobe, 1 of which may have a developing internal solid component.   # Proceed with cycle #5 of Keytruda single agent. Labs today reviewed;  acceptable for treatment today. Tolearting well except for mild chills. Will plan  re-imaging in 3 months or so. Will repeat CT scan in JAN-FEB 2024.   # Chronic headaches/cervical pain/ visual blurriness- not new. On NSAIDs [; monitor for now. OCT 2023- MRI brain- NEGATIVE for any metastatic disease. Defer to opthalmology.  STABLE.   # Borderline Low BP- 95/60s- Ok to hold off Lisinopril-HCZT; continue Bisoprolol; and increase fluids.   # Post throacotomy pain/tingling and numbness:  Continue gabapentin -300 mg TID; and then at extra at night. STABLE.  #Mild rash on the face-question Keytruda versus others.  Recommend hydrocortisone cream over-the-counter- STABLE.  # CAD/ PVD- cramping- recommend evaluation with vascular-in dec 2023  STABLE.  # COPD-stable encouraged continue to avoid smoking; again counseled to quit smoking; recommend evaluation with pulmonary. STABLE.  #Right mildly complex cystic adnexal mass noted- s/p prior pelvic ultrasound for further work-up [s/p Gyn-Onc; Dr.Berchuck; felt high risk for surgery]- monitor for now. STABLE.   # IV Access :s/p  port placement- STABLE.  *AM appts- Monday-   # DISPOSITION: # keytruda today #Follow-up in 3 weeks-MD labs/port- cbc/cmp; Keytruda; Dr..B        All questions were answered. The patient knows to call the clinic with any problems, questions or concerns.    Cammie Sickle, MD 11/13/2022 2:36 PM

## 2022-11-16 ENCOUNTER — Other Ambulatory Visit (INDEPENDENT_AMBULATORY_CARE_PROVIDER_SITE_OTHER): Payer: Self-pay | Admitting: Vascular Surgery

## 2022-11-16 DIAGNOSIS — I70219 Atherosclerosis of native arteries of extremities with intermittent claudication, unspecified extremity: Secondary | ICD-10-CM

## 2022-11-16 DIAGNOSIS — I6523 Occlusion and stenosis of bilateral carotid arteries: Secondary | ICD-10-CM

## 2022-11-16 DIAGNOSIS — I714 Abdominal aortic aneurysm, without rupture, unspecified: Secondary | ICD-10-CM

## 2022-11-19 DIAGNOSIS — I714 Abdominal aortic aneurysm, without rupture, unspecified: Secondary | ICD-10-CM | POA: Insufficient documentation

## 2022-11-19 NOTE — Progress Notes (Unsigned)
MRN : [id number]  Laura Nielsen is a 66 y.o. ([birth year]) female who presents with chief complaint of check circulation.  History of Present Illness:  The patient returns to the office for surveillance of an abdominal aortic aneurysm status post stent graft placement on 03/27/2013.   Patient denies abdominal pain or back pain, no other abdominal complaints. No groin related complaints. No symptoms consistent with distal embolization No changes in claudication distance.  Patient denies interval amaurosis fugax or TIA symptoms. There is no history of worsening leg pain or claudication symptoms.  She rest pain symptoms of the lower extremities.   The patient denies angina or shortness of breath.    In May 2022 she had a lobectomy for lung CA.  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  She also has an ovarian cyst which has been increasing in size.  She was seen by a GYN oncologist but had a very poor interaction with him and is asking about a second opinion.   Duplex ultrasound shows a patent stent graft no endoleak and the sac is 2.81 cm; previous study measured 2.8 cm.   Carotid duplex shows RICA 93-73% and LICA 4-28%  stenosis (increase on the right)   ABI Rt=1.08 and Lt=1.09 triphasic signals bilaterally.  (Previous ABI Rt=1.11 and Lt=1.12 triphasic signals bilaterally)     No outpatient medications have been marked as taking for the 11/20/22 encounter (Appointment) with Delana [facility], Dolores Lory, MD.    Past Medical History:  Diagnosis Date   AAA (abdominal aortic aneurysm) ([place])    s/p EVAR AAA 03/27/13   Arthritis    Asthma    Complication of anesthesia    BP drops after   History of kidney stones    Osteoporosis    Sleep apnea     Past Surgical History:  Procedure Laterality Date   ABDOMINAL AORTIC ENDOVASCULAR STENT GRAFT  03/27/2013   Dr. Hortencia Pilar   Cardiac catheterization  02/2009    70-80% stenosis RCA stent placed. started on Plavix   CARDIAC CATHETERIZATION     COLONOSCOPY WITH PROPOFOL N/A 01/05/2021   Procedure: COLONOSCOPY WITH PROPOFOL;  Surgeon: Virgel Manifold, MD;  Location: [facility] END[facility];  Service: Endoscopy;  Laterality: N/A;   CORONARY ANGIOPLASTY     stent placed   ESOPHAGOGASTRODUODENOSCOPY (EGD) WITH PROPOFOL N/A 01/05/2021   Procedure: ESOPHAGOGASTRODUODENOSCOPY (EGD) WITH PROPOFOL;  Surgeon: Virgel Manifold, MD;  Location: [facility] ENDOSCOPY;  Service: Endoscopy;  Laterality: N/A;   INTERCOSTAL NERVE BLOCK Left 04/06/2021   Procedure: INTERCOSTAL NERVE BLOCK;  Surgeon: Melrose Nakayama, MD;  Location: [facility];  Service: Thoracic;  Laterality: Left;   IR IMAGING GUIDED PORT INSERTION  06/20/2022   KIDNEY STONE SURGERY  1999   LUNG LOBECTOMY Left 04/06/2021   robotic left lower lobectomy 04/06/2021 Dr. Roxan Hockey for Stage 1A adenocarcinoma   NODE DISSECTION Left 04/06/2021   Procedure: NODE DISSECTION;  Surgeon: Melrose Nakayama, MD;  Location: [facility];  Service: Thoracic;  Laterality: Left;   TUBAL LIGATION     VAGINAL HYSTERECTOMY     Menometrorrhagia. Excessive bleeding. Unknown if cervix removed.     Social History Social History   Tobacco Use   Smoking status: Former    Packs/day: 0.25    Years: 44.50    Total pack years: 11.13    Types: Cigarettes  Quit date: 04/05/2021    Years since quitting: 1.6   Smokeless tobacco: Never   Tobacco comments:    12/23/19 states she quit for 3 months and then started back. Pt is going to talk with MD about this.  Vaping Use   Vaping Use: Never used  Substance Use Topics   Alcohol use: Yes    Comment: rarely - once a year   Drug use: No    Family History Family History  Problem Relation Age of Onset   Hypertension Mother    Coronary artery disease Mother    Heart attack Mother        acute   Cancer Mother    Alcohol abuse Father    Depression Father    Hypertension Father     Heart attack Father 57       acute   Alcohol abuse Sister    Hyperlipidemia Sister    Hypertension Sister    Cancer Sister 56   Heart attack Sister        x's 2   Coronary artery disease Sister 75       x's 2   Breast cancer Sister 70    Allergies  Allergen Reactions   Atorvastatin Other (See Comments)    Elevated blood sugar    Omeprazole Nausea And Vomiting   Bupropion Nausea Only    Only on the 150mg  tablets, but the 100mg  didn't help with smoking     REVIEW OF SYSTEMS (Negative unless checked)  Constitutional: [] Weight loss  [] Fever  [] Chills Cardiac: [] Chest pain   [] Chest pressure   [] Palpitations   [] Shortness of breath when laying flat   [] Shortness of breath with exertion. Vascular:  [x] Pain in legs with walking   [] Pain in legs at rest  [] History of DVT   [] Phlebitis   [] Swelling in legs   [] Varicose veins   [] Non-healing ulcers Pulmonary:   [] Uses home oxygen   [] Productive cough   [] Hemoptysis   [] Wheeze  [x] COPD   [] Asthma Neurologic:  [] Dizziness   [] Seizures   [] History of stroke   [] History of TIA  [] Aphasia   [] Vissual changes   [] Weakness or numbness in arm   [] Weakness or numbness in leg Musculoskeletal:   [] Joint swelling   [x] Joint pain   [] Low back pain Hematologic:  [] Easy bruising  [] Easy bleeding   [] Hypercoagulable state   [] Anemic Gastrointestinal:  [] Diarrhea   [] Vomiting  [x] Gastroesophageal reflux/heartburn   [] Difficulty swallowing. Genitourinary:  [] Chronic kidney disease   [] Difficult urination  [] Frequent urination   [] Blood in urine Skin:  [] Rashes   [] Ulcers  Psychological:  [] History of anxiety   []  History of major depression.  Physical Examination  There were no vitals filed for this visit. There is no height or weight on file to calculate BMI. Gen: WD/WN, NAD Head: [place]/AT, No temporalis wasting.  Ear/Nose/Throat: Hearing grossly intact, nares w/o erythema or drainage Eyes: PER, EOMI, sclera nonicteric.  Neck: Supple, no masses.  No  bruit or JVD.  Pulmonary:  Good air movement, no audible wheezing, no use of accessory muscles.  Cardiac: RRR, normal S1, S2, no Murmurs. Vascular:  mild trophic changes, no open wounds Vessel Right Left  Radial Palpable Palpable  PT Trace Palpable Trace Palpable  DP Trace Palpable Trace Palpable  Gastrointestinal: soft, non-distended. No guarding/no peritoneal signs.  Musculoskeletal: M/S 5/5 throughout.  No visible deformity.  Neurologic: CN 2-12 intact. Pain and light touch intact in extremities.  Symmetrical.  Speech is  fluent. Motor exam as listed above. Psychiatric: Judgment intact, Mood & affect appropriate for pt's clinical situation. Dermatologic: No rashes or ulcers noted.  No changes consistent with cellulitis.   CBC Lab Results  Component Value Date   WBC 9.7 11/13/2022   HGB 15.3 (H) 11/13/2022   HCT 45.5 11/13/2022   MCV 84.6 11/13/2022   PLT 208 11/13/2022    BMET    Component Value Date/Time   NA 141 11/13/2022 1018   NA 142 02/13/2022 1027   NA 139 03/28/2013 0354   K 3.5 11/13/2022 1018   K 3.7 03/28/2013 1811   CL 105 11/13/2022 1018   CL 107 03/28/2013 0354   CO2 25 11/13/2022 1018   CO2 27 03/28/2013 0354   GLUCOSE 100 (H) 11/13/2022 1018   GLUCOSE 112 (H) 03/28/2013 0354   BUN 20 11/13/2022 1018   BUN 15 02/13/2022 1027   BUN 8 03/28/2013 0354   CREATININE 0.56 11/13/2022 1018   CREATININE 0.60 08/30/2017 0920   CALCIUM 9.3 11/13/2022 1018   CALCIUM 8.2 (L) 03/28/2013 0354   GFRNONAA >60 11/13/2022 1018   GFRNONAA 99 08/30/2017 0920   GFRAA 93 01/25/2021 1048   GFRAA 115 08/30/2017 0920   Estimated Creatinine Clearance: 86.7 mL/min (by C-G formula based on SCr of 0.56 mg/dL).  COAG Lab Results  Component Value Date   INR 1.0 06/20/2022   INR 1.0 07/12/2021   INR 1.0 04/04/2021    Radiology No results found.   Assessment/Plan 1. Infrarenal abdominal aortic aneurysm (AAA) without rupture (HCC) Recommend:  Patient is status post  successful endovascular repair of the AAA.   No further intervention is required at this time.   No endoleak is detected and the aneurysm sac is stable.  The patient will continue antiplatelet therapy as prescribed as well as aggressive management of hyperlipidemia. Exercise is again strongly encouraged.   However, endografts require continued surveillance with ultrasound or CT scan. This is mandatory to detect any changes that allow repressurization of the aneurysm sac.  The patient is informed that this would be asymptomatic.  The patient is reminded that lifelong routine surveillance is a necessity with an endograft. Patient will continue to follow-up at the specified interval with ultrasound of the aorta.  2. Atherosclerosis of native artery of both lower extremities with intermittent claudication (HCC)  Recommend:  The patient has evidence of atherosclerosis of the lower extremities with claudication.  The patient does not voice lifestyle limiting changes at this point in time.  Noninvasive studies do not suggest clinically significant change.  No invasive studies, angiography or surgery at this time The patient should continue walking and begin a more formal exercise program.  The patient should continue antiplatelet therapy and aggressive treatment of the lipid abnormalities  No changes in the patient's medications at this time  Continued surveillance is indicated as atherosclerosis is likely to progress with time.    The patient will continue follow up with noninvasive studies as ordered.  - VAS [place] ABI WITH/WO TBI; Future  3. Bilateral carotid artery stenosis Recommend:  Given the patient's asymptomatic subcritical stenosis no further invasive testing or surgery at this time.  Carotid duplex shows RICA 51-76% and LICA 1-60%  stenosis .  Continue antiplatelet therapy as prescribed Continue management of CAD, HTN and Hyperlipidemia Healthy heart diet,  encouraged exercise  at least 4 times per week Follow up in 12 months with duplex ultrasound and physical exam  - VAS US CAROTID; Future  4. Primary hypertension Continue antihypertensive medications as already ordered, these medications have been reviewed and there are no changes at this time.  5. Type 2 diabetes mellitus without complication, without long-term current use of insulin (HCC) Continue hypoglycemic medications as already ordered, these medications have been reviewed and there are no changes at this time.  Hgb A1C to be monitored as already arranged by primary service  6. 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

## 2022-11-20 ENCOUNTER — Encounter (INDEPENDENT_AMBULATORY_CARE_PROVIDER_SITE_OTHER): Payer: Self-pay | Admitting: Vascular Surgery

## 2022-11-20 ENCOUNTER — Ambulatory Visit (INDEPENDENT_AMBULATORY_CARE_PROVIDER_SITE_OTHER): Payer: Medicare Other | Admitting: Vascular Surgery

## 2022-11-20 ENCOUNTER — Ambulatory Visit (INDEPENDENT_AMBULATORY_CARE_PROVIDER_SITE_OTHER): Payer: Medicare Other

## 2022-11-20 ENCOUNTER — Other Ambulatory Visit (INDEPENDENT_AMBULATORY_CARE_PROVIDER_SITE_OTHER): Payer: Self-pay | Admitting: Vascular Surgery

## 2022-11-20 VITALS — BP 124/85 | HR 70 | Resp 16 | Wt 219.0 lb

## 2022-11-20 DIAGNOSIS — I6523 Occlusion and stenosis of bilateral carotid arteries: Secondary | ICD-10-CM

## 2022-11-20 DIAGNOSIS — I739 Peripheral vascular disease, unspecified: Secondary | ICD-10-CM | POA: Diagnosis not present

## 2022-11-20 DIAGNOSIS — E119 Type 2 diabetes mellitus without complications: Secondary | ICD-10-CM

## 2022-11-20 DIAGNOSIS — I10 Essential (primary) hypertension: Secondary | ICD-10-CM

## 2022-11-20 DIAGNOSIS — I7143 Infrarenal abdominal aortic aneurysm, without rupture: Secondary | ICD-10-CM | POA: Diagnosis not present

## 2022-11-20 DIAGNOSIS — I70219 Atherosclerosis of native arteries of extremities with intermittent claudication, unspecified extremity: Secondary | ICD-10-CM | POA: Diagnosis not present

## 2022-11-20 DIAGNOSIS — I714 Abdominal aortic aneurysm, without rupture, unspecified: Secondary | ICD-10-CM

## 2022-11-20 DIAGNOSIS — I70213 Atherosclerosis of native arteries of extremities with intermittent claudication, bilateral legs: Secondary | ICD-10-CM | POA: Diagnosis not present

## 2022-11-20 DIAGNOSIS — D3911 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: Secondary | ICD-10-CM

## 2022-11-20 DIAGNOSIS — Z9889 Other specified postprocedural states: Secondary | ICD-10-CM | POA: Diagnosis not present

## 2022-11-21 ENCOUNTER — Other Ambulatory Visit: Payer: Self-pay

## 2022-11-27 ENCOUNTER — Other Ambulatory Visit: Payer: Self-pay | Admitting: Family Medicine

## 2022-11-27 DIAGNOSIS — E7800 Pure hypercholesterolemia, unspecified: Secondary | ICD-10-CM

## 2022-12-05 ENCOUNTER — Encounter: Payer: Self-pay | Admitting: Internal Medicine

## 2022-12-05 ENCOUNTER — Ambulatory Visit: Payer: Medicare Other | Admitting: Internal Medicine

## 2022-12-05 ENCOUNTER — Other Ambulatory Visit: Payer: Medicare Other

## 2022-12-05 ENCOUNTER — Inpatient Hospital Stay (HOSPITAL_BASED_OUTPATIENT_CLINIC_OR_DEPARTMENT_OTHER): Payer: Medicare Other | Admitting: Internal Medicine

## 2022-12-05 ENCOUNTER — Ambulatory Visit: Payer: Medicare Other

## 2022-12-05 ENCOUNTER — Inpatient Hospital Stay: Payer: Medicare Other | Attending: Internal Medicine

## 2022-12-05 ENCOUNTER — Inpatient Hospital Stay: Payer: Medicare Other

## 2022-12-05 VITALS — BP 142/69 | HR 69 | Temp 96.9°F | Resp 19 | Wt 218.5 lb

## 2022-12-05 DIAGNOSIS — C3432 Malignant neoplasm of lower lobe, left bronchus or lung: Secondary | ICD-10-CM

## 2022-12-05 DIAGNOSIS — Z5112 Encounter for antineoplastic immunotherapy: Secondary | ICD-10-CM | POA: Insufficient documentation

## 2022-12-05 DIAGNOSIS — C7972 Secondary malignant neoplasm of left adrenal gland: Secondary | ICD-10-CM | POA: Insufficient documentation

## 2022-12-05 DIAGNOSIS — Z87891 Personal history of nicotine dependence: Secondary | ICD-10-CM | POA: Insufficient documentation

## 2022-12-05 DIAGNOSIS — Z79899 Other long term (current) drug therapy: Secondary | ICD-10-CM | POA: Insufficient documentation

## 2022-12-05 LAB — CBC WITH DIFFERENTIAL/PLATELET
Abs Immature Granulocytes: 0.02 10*3/uL (ref 0.00–0.07)
Basophils Absolute: 0 10*3/uL (ref 0.0–0.1)
Basophils Relative: 1 %
Eosinophils Absolute: 0.4 10*3/uL (ref 0.0–0.5)
Eosinophils Relative: 4 %
HCT: 44.7 % (ref 36.0–46.0)
Hemoglobin: 15.1 g/dL — ABNORMAL HIGH (ref 12.0–15.0)
Immature Granulocytes: 0 %
Lymphocytes Relative: 39 %
Lymphs Abs: 3.3 10*3/uL (ref 0.7–4.0)
MCH: 28.3 pg (ref 26.0–34.0)
MCHC: 33.8 g/dL (ref 30.0–36.0)
MCV: 83.7 fL (ref 80.0–100.0)
Monocytes Absolute: 0.7 10*3/uL (ref 0.1–1.0)
Monocytes Relative: 8 %
Neutro Abs: 4.1 10*3/uL (ref 1.7–7.7)
Neutrophils Relative %: 48 %
Platelets: 214 10*3/uL (ref 150–400)
RBC: 5.34 MIL/uL — ABNORMAL HIGH (ref 3.87–5.11)
RDW: 14 % (ref 11.5–15.5)
WBC: 8.5 10*3/uL (ref 4.0–10.5)
nRBC: 0 % (ref 0.0–0.2)

## 2022-12-05 LAB — COMPREHENSIVE METABOLIC PANEL
ALT: 16 U/L (ref 0–44)
AST: 22 U/L (ref 15–41)
Albumin: 4 g/dL (ref 3.5–5.0)
Alkaline Phosphatase: 92 U/L (ref 38–126)
Anion gap: 8 (ref 5–15)
BUN: 18 mg/dL (ref 8–23)
CO2: 26 mmol/L (ref 22–32)
Calcium: 9.3 mg/dL (ref 8.9–10.3)
Chloride: 106 mmol/L (ref 98–111)
Creatinine, Ser: 0.65 mg/dL (ref 0.44–1.00)
GFR, Estimated: >60 mL/min (ref >60)
Glucose, Bld: 108 mg/dL — ABNORMAL HIGH (ref 70–99)
Potassium: 3.4 mmol/L — ABNORMAL LOW (ref 3.5–5.1)
Sodium: 140 mmol/L (ref 135–145)
Total Bilirubin: 0.9 mg/dL (ref 0.3–1.2)
Total Protein: 7.2 g/dL (ref 6.5–8.1)

## 2022-12-05 MED ORDER — SODIUM CHLORIDE 0.9 % IV SOLN
200.0000 mg | Freq: Once | INTRAVENOUS | Status: AC
  Administered 2022-12-05: 200 mg via INTRAVENOUS
  Filled 2022-12-05: qty 200

## 2022-12-05 MED ORDER — SODIUM CHLORIDE 0.9% FLUSH
10.0000 mL | INTRAVENOUS | Status: DC | PRN
  Filled 2022-12-05: qty 10

## 2022-12-05 MED ORDER — SODIUM CHLORIDE 0.9 % IV SOLN
Freq: Once | INTRAVENOUS | Status: AC
  Filled 2022-12-05: qty 250

## 2022-12-05 MED ORDER — HEPARIN SOD (PORK) LOCK FLUSH 100 UNIT/ML IV SOLN
500.0000 [IU] | Freq: Once | INTRAVENOUS | Status: AC | PRN
  Administered 2022-12-05: 500 [IU]
  Filled 2022-12-05: qty 5

## 2022-12-05 NOTE — Patient Instructions (Signed)
[facility] [place]  Discharge Instructions: Thank you for choosing [facility] to provide your oncology and hematology care.  If you have a lab appointment with the [facility], please go directly to the [facility] and check in at the registration area.  Wear comfortable clothing and clothing appropriate for easy access to any Portacath or PICC line.   We strive to give you quality time with your provider. You may need to reschedule your appointment if you arrive late (15 or more minutes).  Arriving late affects you and other patients whose appointments are after yours.  Also, if you miss three or more appointments without notifying the office, you may be dismissed from the clinic at the provider's discretion.      For prescription refill requests, have your pharmacy contact our office and allow 72 hours for refills to be completed.    Today you received the following chemotherapy and/or immunotherapy agents- Keytruda      To help prevent nausea and vomiting after your treatment, we encourage you to take your nausea medication as directed.  BELOW ARE SYMPTOMS THAT SHOULD BE REPORTED IMMEDIATELY: *FEVER GREATER THAN 100.4 F (38 C) OR HIGHER *CHILLS OR SWEATING *NAUSEA AND VOMITING THAT IS NOT CONTROLLED WITH YOUR NAUSEA MEDICATION *UNUSUAL SHORTNESS OF BREATH *UNUSUAL BRUISING OR BLEEDING *URINARY PROBLEMS (pain or burning when urinating, or frequent urination) *BOWEL PROBLEMS (unusual diarrhea, constipation, pain near the anus) TENDERNESS IN MOUTH AND THROAT WITH OR WITHOUT PRESENCE OF ULCERS (sore throat, sores in mouth, or a toothache) UNUSUAL RASH, SWELLING OR PAIN  UNUSUAL VAGINAL DISCHARGE OR ITCHING   Items with * indicate a potential emergency and should be followed up as soon as possible or go to the Emergency Department if any problems should occur.  Please show the CHEMOTHERAPY ALERT CARD or IMMUNOTHERAPY ALERT CARD at check-in to  the Emergency Department and triage nurse.  Should you have questions after your visit or need to cancel or reschedule your appointment, please contact [facility] [place] [facility] [place]  [phone] and follow the prompts.  Office hours are 8:00 a.m. to 4:30 p.m. Monday - Friday. Please note that voicemails left after 4:00 p.m. may not be returned until the following business day.  We are closed weekends and major holidays. You have access to a nurse at all times for urgent questions. Please call the main number to the clinic [phone] and follow the prompts.  For any non-urgent questions, you may also contact your provider using MyChart. We now offer e-Visits for anyone 39 and older to request care online for non-urgent symptoms. For details visit [email or web address].   Also download the MyChart app! Go to the app store, search "MyChart", open the app, select [facility], and log in with your MyChart username and password.

## 2022-12-05 NOTE — Progress Notes (Signed)
Patient has no concerns today. 

## 2022-12-05 NOTE — Assessment & Plan Note (Addendum)
#  JUNE 2023-STAGE IV--recurrent/metastatic disease with New left adrenal metastasis;  PET scan JUNE 20th- 2023-  Enlarged 4 cm hypermetabolic LEFT adrenal metastasis;  Suspicion of metastatic adenopathy to the LEFT hilum.  Part solid nodule in the RIGHT lower lobe without metabolic activity. Findings are suspicious for indolent adenocarcinoma.  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 Currently on single agent Keytruda [PD-L1 greater than 80% ] ;  OCT 24th, 2023-  Interval response to therapy as evidenced by marked decrease in size of a left adrenal metastasis. Ground-glass nodules in the right lower lobe, 1 of which may have a developing internal solid component. STABLE.   # Proceed with cycle #6  of Keytruda single agent. Labs today reviewed;  acceptable for treatment today. Tolerating well except for mild chills. Will plan re-imaging in 3 months or so. Will repeat CT scan today.   # Chronic headaches/cervical pain/ visual blurriness- not new. On NSAIDs [; monitor for now. OCT 2023- MRI brain- NEGATIVE for any metastatic disease.  Declined referral to neurology/given previous extensive workup.  STABLE.   # Post throacotomy pain/tingling and numbness:  Continue gabapentin -300 mg TID; and then at extra at night. STABLE.  # CAD/ PVD- cramping- s/p evaluation [Dr.Schneir] dec 2023  STABLE.  # COPD-stable encouraged continue to avoid smoking; again counseled to quit smoking; recommend evaluation with pulmonary. STABLE.  #Right mildly complex cystic adnexal mass noted- s/p prior pelvic ultrasound for further work-up [s/p Gyn-Onc; Dr.Berchuck; felt high risk for surgery]- monitor for now. STABLE.  # IV Access :s/p  port placement- STABLE.  # Vaccinations: s/p Pneumonia shot [2022] flu shot/covid- declines  *AM appts- Monday-   # DISPOSITION: # keytruda today #Follow-up in 3 weeks-MD  labs/port- cbc/cmp; Keytruda; TSH; CT CAP- Dr. .B

## 2022-12-05 NOTE — Progress Notes (Signed)
[place] NOTE  Patient Care Team: Birdie Sons, MD as PCP - General (Family Medicine) Ubaldo Glassing Javier Docker, MD as Consulting Physician (Cardiology) Schnier, Dolores Lory, MD (Vascular Surgery) Vladimir Crofts, MD as Consulting Physician (Neurology) Milinda Pointer, MD as Referring Physician (Pain Medicine) Pa, [place] (Optometry) Ubaldo Glassing, Javier Docker, MD as Consulting Physician (Cardiology) Virgel Manifold, MD (Inactive) as Consulting Physician (Gastroenterology) Gae Dry, MD as Referring Physician (Obstetrics and Gynecology) Telford Nab, RN as Oncology Nurse Navigator Cammie Sickle, MD as Consulting Physician (Oncology)   CHIEF COMPLAINTS/PURPOSE OF CONSULTATION: lung cancer  #  Oncology History Overview Note  #MAY 2022- LLL nodule 2.3 cm Adenocarcinoma Stage IA; s/p lobectomy.  No adjuvant therapy  # CT scan 4th June 2023-highly concerning for recurrent/metastatic disease with-. New left adrenal metastasis;  Ground-glass and part solid nodules in the peripheral right lower lobe, unchanged from 11/07/2021 but slightly enlarged from 01/01/2018. Findings are suspicious for indolent adenocarcinoma.  F One- PFL1 =80%; KRAS G12C PET scan JUNE 20th- 2023-  Enlarged 4 cm hypermetabolic LEFT adrenal metastasis;  Suspicion of metastatic adenopathy to the LEFT hilum.  Part solid nodule in the RIGHT lower lobe without metabolic activity.   3 ADRENAL BIOPSY- CK7 POSITIVE ADENOCARCINOMA- There is limited tissue remaining for ancillary testing, not likely  sufficient for NGS testing.   # AUG 7th, 2023-single agent Keytruda.    Primary cancer of left lower lobe of lung ([place])  05/09/2021 Initial Diagnosis   Primary cancer of left lower lobe of lung ([place])   07/04/2022 -  Chemotherapy   Patient is on Treatment Plan : LUNG NSCLC Pembrolizumab (200) q21d     07/10/2022 - 07/10/2022 Chemotherapy   Patient is on Treatment Plan : LUNG NSCLC Pembrolizumab  (200) q21d        HISTORY OF PRESENTING ILLNESS: Alone.  Ambulating independently.  Ezell Melikian 67 y.o.  female history of smoking; prior history of lung cancer-with likely recurrence to left adrenal gland [s/p Bx CK-7 positive TTF-1 negative]-clinically suggestive of recurrent lung cancer on single agent [name] is here for follow-up.    Patient has no concerns today  Continues to complain of chronic joint pains not any worse.  Denies any diarrhea.  Denies any abdominal pain. Otherwise patient continues to have mild shortness of breath on exertion.  Denies any headaches.  Denies any nausea vomiting.  Complains of cramping in the legs chronic.   Review of Systems  Constitutional:  Negative for chills, diaphoresis, fever, malaise/fatigue and weight loss.  HENT:  Negative for nosebleeds and sore throat.   Eyes:  Negative for double vision.  Respiratory:  Negative for cough, hemoptysis, sputum production, shortness of breath and wheezing.   Cardiovascular:  Positive for chest pain. Negative for palpitations, orthopnea and leg swelling.  Gastrointestinal:  Negative for abdominal pain, blood in stool, constipation, diarrhea, heartburn, melena, nausea and vomiting.  Genitourinary:  Negative for dysuria, frequency and urgency.  Musculoskeletal:  Positive for back pain and joint pain.  Skin: Negative.  Negative for itching and rash.  Neurological:  Positive for tingling. Negative for dizziness, focal weakness, weakness and headaches.  Endo/Heme/Allergies:  Does not bruise/bleed easily.  Psychiatric/Behavioral:  Negative for depression. The patient is not nervous/anxious and does not have insomnia.      MEDICAL HISTORY:  Past Medical History:  Diagnosis Date   AAA (abdominal aortic aneurysm) ([place])    s/p EVAR AAA 03/27/13   Arthritis  Asthma    Complication of anesthesia    BP drops after   History of kidney stones    Osteoporosis    Sleep apnea     SURGICAL HISTORY: Past  Surgical History:  Procedure Laterality Date   ABDOMINAL AORTIC ENDOVASCULAR STENT GRAFT  03/27/2013   Dr. Hortencia Pilar   Cardiac catheterization  02/2009   70-80% stenosis RCA stent placed. started on Plavix   CARDIAC CATHETERIZATION     COLONOSCOPY WITH PROPOFOL N/A 01/05/2021   Procedure: COLONOSCOPY WITH PROPOFOL;  Surgeon: Virgel Manifold, MD;  Location: [facility] ENDOSCOPY;  Service: Endoscopy;  Laterality: N/A;   CORONARY ANGIOPLASTY     stent placed   ESOPHAGOGASTRODUODENOSCOPY (EGD) WITH PROPOFOL N/A 01/05/2021   Procedure: ESOPHAGOGASTRODUODENOSCOPY (EGD) WITH PROPOFOL;  Surgeon: Virgel Manifold, MD;  Location: [facility] ENDOSCOPY;  Service: Endoscopy;  Laterality: N/A;   INTERCOSTAL NERVE BLOCK Left 04/06/2021   Procedure: INTERCOSTAL NERVE BLOCK;  Surgeon: [place] Nakayama, MD;  Location: [place];  Service: Thoracic;  Laterality: Left;   IR IMAGING GUIDED PORT INSERTION  06/20/2022   KIDNEY STONE SURGERY  1999   LUNG LOBECTOMY Left 04/06/2021   robotic left lower lobectomy 04/06/2021 Dr. Roxan Hockey for Stage 1A adenocarcinoma   NODE DISSECTION Left 04/06/2021   Procedure: NODE DISSECTION;  Surgeon: [place] Nakayama, MD;  Location: [place];  Service: Thoracic;  Laterality: Left;   TUBAL LIGATION     VAGINAL HYSTERECTOMY     Menometrorrhagia. Excessive bleeding. Unknown if cervix removed.     SOCIAL HISTORY: Social History   Socioeconomic History   Marital status: Divorced    Spouse name: Not on file   Number of children: 2   Years of education: H/S   Highest education level: High school graduate  Occupational History   Occupation: Disabled   Occupation: retired  Tobacco Use   Smoking status: Former    Packs/day: 0.25    Years: 44.50    Total pack years: 11.13    Types: Cigarettes    Quit date: 04/05/2021    Years since quitting: 1.6   Smokeless tobacco: Never   Tobacco comments:    12/23/19 states she quit for 3 months and then started back. Pt is going  to talk with MD about this.  Vaping Use   Vaping Use: Never used  Substance and Sexual Activity   Alcohol use: Yes    Comment: rarely - once a year   Drug use: No   Sexual activity: Not on file  Other Topics Concern   Not on file  Social History Narrative   Hx of smoking; quit prior to lung surgery. Lives in [place]- self. No alcohol. 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 On disability sec to spinal pain.    Social Determinants of Health   Financial Resource Strain: Low Risk  (02/10/2021)   Overall Financial Resource Strain (CARDIA)    Difficulty of Paying Living Expenses: Not hard at all  Food Insecurity: No Food Insecurity (03/21/2022)   Hunger Vital Sign    Worried About Running Out of Food in the Last Year: Never true    Ran Out of Food in the Last Year: Never true  Transportation Needs: No Transportation Needs (03/21/2022)   PRAPARE - Hydrologist (Medical): No    Lack of Transportation (Non-Medical): No  Physical Activity: Inactive (02/10/2021)   Exercise Vital Sign    Days of Exercise per Week: 0 days  Minutes of Exercise per Session: 0 min  Stress: Stress Concern Present (02/10/2021)   [place]    Feeling of Stress : To some extent  Social Connections: Socially Isolated (02/10/2021)   Social Connection and Isolation Panel [NHANES]    Frequency of Communication with Friends and Family: More than three times a week    Frequency of Social Gatherings with Friends and Family: Once a week    Attends Religious Services: Never    Marine scientist or Organizations: No    Attends Archivist Meetings: Never    Marital Status: Divorced  Human resources officer Violence: Not At Risk (02/10/2021)   Humiliation, Afraid, Rape, and Kick questionnaire    Fear of Current or Ex-Partner: No    Emotionally Abused: No    Physically Abused: No    Sexually Abused: No    FAMILY  HISTORY: Family History  Problem Relation Age of Onset   Hypertension Mother    Coronary artery disease Mother    Heart attack Mother        acute   Cancer Mother    Alcohol abuse Father    Depression Father    Hypertension Father    Heart attack Father 17       acute   Alcohol abuse Sister    Hyperlipidemia Sister    Hypertension Sister    Cancer Sister 40   Heart attack Sister        x's 2   Coronary artery disease Sister 57       x's 2   Breast cancer Sister 56    ALLERGIES:  is allergic to atorvastatin, omeprazole, and bupropion.  MEDICATIONS:  Current Outpatient Medications  Medication Sig Dispense Refill   allopurinol (ZYLOPRIM) 100 MG tablet Take 1 tablet (100 mg total) by mouth daily. 90 tablet 3   aspirin 81 MG tablet Take 81 mg by mouth daily.      bisoprolol (ZEBETA) 10 MG tablet Take 1 tablet (10 mg total) by mouth daily. 90 tablet 4   Cholecalciferol 25 MCG (1000 UT) capsule Take 1,000 Units by mouth daily.     clopidogrel (PLAVIX) 75 MG tablet Take 1 tablet (75 mg total) by mouth daily. 90 tablet 4   gabapentin (NEURONTIN) 300 MG capsule Take 1 capsule (300 mg total) by mouth 3 (three) times daily AND 2 capsules (600 mg total) at bedtime.     ibuprofen (ADVIL) 200 MG tablet Take 400 mg by mouth 2 (two) times daily as needed (headaches).     lidocaine-prilocaine (EMLA) cream Apply on the port. 30 -45 min  prior to port access. 30 g 3   lisinopril-hydrochlorothiazide (ZESTORETIC) 20-12.5 MG tablet Take 1 tablet by mouth daily. 90 tablet 0   OZEMPIC, 1 MG/DOSE, 4 MG/3ML SOPN Inject 1 mg into the skin once a week. 3 mL 4   pantoprazole (PROTONIX) 20 MG tablet Take 1 tablet (20 mg total) by mouth daily. 90 tablet 4   PROAIR HFA 108 (90 Base) MCG/ACT inhaler Inhale 2 puffs into the lungs every 6 (six) hours as needed for wheezing or shortness of breath. 8.5 g 4   rosuvastatin (CRESTOR) 20 MG tablet Take 1 tablet (20 mg total) by mouth daily. 90 tablet 0   azelastine  (ASTELIN) 0.1 % nasal spray Place 2 sprays into both nostrils 2 (two) times daily. Use in each nostril as directed (Patient not taking: Reported on 09/11/2022) 30 mL  2   cyanocobalamin 1000 MCG tablet Take 1,000 mcg by mouth daily. (Patient not taking: Reported on 12/05/2022)     fluticasone (FLONASE) 50 MCG/ACT nasal spray Place into both nostrils daily. (Patient not taking: Reported on 09/11/2022)     montelukast (SINGULAIR) 10 MG tablet Take 1 tablet (10 mg total) by mouth at bedtime. (Patient not taking: Reported on 10/23/2022) 30 tablet 3   No current facility-administered medications for this visit.   Facility-Administered Medications Ordered in Other Visits  Medication Dose Route Frequency Provider Last Rate Last Admin   heparin lock flush 100 UNIT/ML injection            heparin lock flush 100 unit/mL  500 Units Intracatheter Once PRN Cammie Sickle, MD       pembrolizumab [facility]) 200 mg in sodium chloride 0.9 % 50 mL chemo infusion  200 mg Intravenous Once Cammie Sickle, MD 116 mL/hr at 12/05/22 0941 200 mg at 12/05/22 0941   sodium chloride flush (NS) 0.9 % injection 10 mL  10 mL Intracatheter PRN Cammie Sickle, MD        Mild maculopapular rash on the [place] area left more than right.  PHYSICAL EXAMINATION: ECOG PERFORMANCE STATUS: 1 - Symptomatic but completely ambulatory  Vitals:   12/05/22 0838  BP: (!) 142/69  Pulse: 69  Resp: 19  Temp: (!) 96.9 F (36.1 C)  SpO2: 97%   Filed Weights   12/05/22 0838  Weight: 218 lb 8 oz (99.1 kg)    Physical Exam HENT:     Head: Normocephalic and atraumatic.     Mouth/Throat:     Pharynx: No oropharyngeal exudate.  Eyes:     Pupils: Pupils are equal, round, and reactive to light.  Cardiovascular:     Rate and Rhythm: Normal rate and regular rhythm.  Pulmonary:     Comments: Decreased breath sounds bilaterally.  No wheeze or crackles Abdominal:     General: Bowel sounds are normal. There is no  distension.     Palpations: Abdomen is soft. There is no mass.     Tenderness: There is no abdominal tenderness. There is no guarding or rebound.  Musculoskeletal:        General: No tenderness. Normal range of motion.     Cervical back: Normal range of motion and neck supple.  Skin:    General: Skin is warm.  Neurological:     Mental Status: She is alert and oriented to person, place, and time.  Psychiatric:        Mood and Affect: Affect normal.      LABORATORY DATA:  I have reviewed the data as listed Lab Results  Component Value Date   WBC 8.5 12/05/2022   HGB 15.1 (H) 12/05/2022   HCT 44.7 12/05/2022   MCV 83.7 12/05/2022   PLT 214 12/05/2022   Recent Labs    10/23/22 1016 11/13/22 1018 12/05/22 0756  NA 137 141 140  K 3.6 3.5 3.4*  CL 106 105 106  CO2 _0 GLUCOSE 101* 100* 108*  BUN _1 CREATININE 0.59 0.56 0.65  CALCIUM 9.5 9.3 9.3  GFRNONAA >60 >60 >60  PROT 7.5 6.9 7.2  ALBUMIN 4.0 3.9 4.0  AST _2 ALT _3 ALKPHOS 86 87 92  BILITOT 0.8 0.8 0.9    RADIOGRAPHIC STUDIES: I have personally reviewed the radiological images as listed and agreed with the findings in the  report. VAS [place] ABI WITH/WO TBI  Result Date: 11/20/2022  LOWER EXTREMITY DOPPLER STUDY Patient Name:  Janece Laidlaw  Date of Exam:   11/20/2022 Medical Rec #: [id number]    Accession #:    [id number] Date of Birth: [birth year]    Patient Gender: F Patient Age:   67 years Exam Location:  [facility] Procedure:      VAS [place] ABI WITH/WO TBI Referring Phys: --------------------------------------------------------------------------------  Indications: Peripheral artery disease.  Vascular Interventions: 03/27/13: Aortoiliac stent for aortoiliac stenosis. Performing Technologist: Blondell Reveal RT, RDMS, RVT  Examination Guidelines: A complete evaluation includes at minimum, Doppler waveform signals and systolic blood pressure reading at the level of bilateral brachial,  anterior tibial, and posterior tibial arteries, when vessel segments are accessible. Bilateral testing is considered an integral part of a complete examination. Photoelectric Plethysmograph (PPG) waveforms and toe systolic pressure readings are included as required and additional duplex testing as needed. Limited examinations for reoccurring indications may be performed as noted.  ABI Findings: +---------+------------------+-----+-----------+-------+ Right    Rt Pressure (mmHg)IndexWaveform   Comment +---------+------------------+-----+-----------+-------+ Brachial 132                                       +---------+------------------+-----+-----------+-------+ PTA      126               0.95 multiphasic        +---------+------------------+-----+-----------+-------+ DP       142               1.08 multiphasic        +---------+------------------+-----+-----------+-------+ Great Toe114               0.86 Normal             +---------+------------------+-----+-----------+-------+ +---------+------------------+-----+-----------+-------+ Left     Lt Pressure (mmHg)IndexWaveform   Comment +---------+------------------+-----+-----------+-------+ Brachial 118                                       +---------+------------------+-----+-----------+-------+ PTA      144               1.09 multiphasic        +---------+------------------+-----+-----------+-------+ DP       143               1.08 multiphasic        +---------+------------------+-----+-----------+-------+ Great Toe109               0.83 Normal             +---------+------------------+-----+-----------+-------+ Bilateral ABIs and TBIs appear essentially unchanged compared to prior study on 06/30/21.  Summary: Bilateral: Bilateral ankle-brachial indexes are within normal range. Bilateral toe-brachial indexes are within normal range.  *See table(s) above for measurements and observations.  Electronically  signed by Hortencia Pilar MD on 11/20/2022 at 4:31:27 PM.    Final    VAS US AORTA/IVC/ILIACS  Result Date: 11/20/2022 ABDOMINAL AORTA STUDY Patient Name:  HELOISE GORDAN  Date of Exam:   11/20/2022 Medical Rec #: [id number]    Accession #:    [id number] Date of Birth: [birth year]    Patient Gender: F Patient Age:   11 years Exam Location:  [facility] Procedure:      VAS US AORTA/IVC/ILIACS Referring Phys: Hortencia Pilar --------------------------------------------------------------------------------  Indications: PAD Vascular Interventions: 03/27/13: Aortoiliac stent for aortoiliac stenosis;.  Performing Technologist: Blondell Reveal RT, RDMS, RVT  Examination Guidelines: A complete evaluation includes B-mode imaging, spectral Doppler, color Doppler, and power Doppler as needed of all accessible portions of each vessel. Bilateral testing is considered an integral part of a complete examination. Limited examinations for reoccurring indications may be performed as noted.  Abdominal Aorta Findings: +-------------+-------+----------+----------+--------+--------+--------+ Location     AP (cm)Trans (cm)PSV (cm/s)WaveformThrombusComments +-------------+-------+----------+----------+--------+--------+--------+ Proximal     2.56   2.56      41        biphasic                 +-------------+-------+----------+----------+--------+--------+--------+ Mid          1.73   1.65      60        biphasic                 +-------------+-------+----------+----------+--------+--------+--------+ Distal       1.62   1.62      42        biphasic                 +-------------+-------+----------+----------+--------+--------+--------+ RT CIA Prox  1.5    1.5       44        biphasic                 +-------------+-------+----------+----------+--------+--------+--------+ RT CIA Distal                 129       biphasic                  +-------------+-------+----------+----------+--------+--------+--------+ RT EIA Prox                   77        biphasic                 +-------------+-------+----------+----------+--------+--------+--------+ RT EIA Distal                 111       biphasic                 +-------------+-------+----------+----------+--------+--------+--------+ LT CIA Prox  1.4    1.4       89        biphasic                 +-------------+-------+----------+----------+--------+--------+--------+ LT CIA Distal                 78        biphasic                 +-------------+-------+----------+----------+--------+--------+--------+ LT EIA Prox                   73        biphasic                 +-------------+-------+----------+----------+--------+--------+--------+ LT EIA Distal                 73        biphasic                 +-------------+-------+----------+----------+--------+--------+--------+  Summary: Abdominal Aorta: Patent aortoiliac stent with no significant stenosis or aneurysmal dilatation noted throughout the aortoiliac system.  *See table(s) above for measurements and observations.  Electronically signed by Hortencia Pilar MD on 11/20/2022 at 4:31:21 PM.  Final    VAS US CAROTID  Result Date: 11/20/2022 Carotid Arterial Duplex Study Patient Name:  CHESNIE CAPELL  Date of Exam:   11/20/2022 Medical Rec #: [id number]    Accession #:    [id number] Date of Birth: 01/23/1956    Patient Gender: F Patient Age:   67 years Exam Location:  [facility] Procedure:      VAS US CAROTID Referring Phys: Hortencia Pilar --------------------------------------------------------------------------------  Indications: Carotid artery disease. Performing Technologist: Blondell Reveal RT, RDMS, RVT  Examination Guidelines: A complete evaluation includes B-mode imaging, spectral Doppler, color Doppler, and power Doppler as needed of all accessible portions of each vessel.  Bilateral testing is considered an integral part of a complete examination. Limited examinations for reoccurring indications may be performed as noted.  Right Carotid Findings: +----------+--------+--------+--------+------------------+-----------------+           PSV cm/sEDV cm/sStenosisPlaque DescriptionComments          +----------+--------+--------+--------+------------------+-----------------+ CCA Prox  60      17                                                  +----------+--------+--------+--------+------------------+-----------------+ CCA Mid   62      17                                                  +----------+--------+--------+--------+------------------+-----------------+ CCA Distal56      18              heterogenous                        +----------+--------+--------+--------+------------------+-----------------+ ICA Prox  203     91      60-79%  calcific          ICA/CCA ratio=3.3 +----------+--------+--------+--------+------------------+-----------------+ ICA Mid   99      33                                                  +----------+--------+--------+--------+------------------+-----------------+ ICA Distal62      22                                                  +----------+--------+--------+--------+------------------+-----------------+ ECA       187     21      >50%    calcific                            +----------+--------+--------+--------+------------------+-----------------+ +----------+--------+-------+----------------+-------------------+           PSV cm/sEDV cmsDescribe        Arm Pressure (mmHG) +----------+--------+-------+----------------+-------------------+ [id number]            Multiphasic, WNL                    +----------+--------+-------+----------------+-------------------+ +---------+--------+--+--------+---------+ VertebralPSV cm/s52EDV cm/sAntegrade +---------+--------+--+--------+---------+  Left Carotid Findings: +----------+--------+--------+--------+------------------+--------+  PSV cm/sEDV cm/sStenosisPlaque DescriptionComments +----------+--------+--------+--------+------------------+--------+ CCA Prox  91      21                                         +----------+--------+--------+--------+------------------+--------+ CCA Mid   79      19                                         +----------+--------+--------+--------+------------------+--------+ CCA Distal61      19              heterogenous               +----------+--------+--------+--------+------------------+--------+ ICA Prox  84      30      1-39%   heterogenous               +----------+--------+--------+--------+------------------+--------+ ICA Mid   92      31                                         +----------+--------+--------+--------+------------------+--------+ ICA Distal70      24                                         +----------+--------+--------+--------+------------------+--------+ ECA       85      16              heterogenous               +----------+--------+--------+--------+------------------+--------+ +----------+--------+--------+----------------+-------------------+           PSV cm/sEDV cm/sDescribe        Arm Pressure (mmHG) +----------+--------+--------+----------------+-------------------+ [id number]              Multiphasic, WNL                    +----------+--------+--------+----------------+-------------------+ +---------+--------+--+--------+---------+ VertebralPSV cm/s49EDV cm/sAntegrade +---------+--------+--+--------+---------+  Summary: Right Carotid: Velocities in the right ICA are consistent with a 60-79%                stenosis. The ECA appears >50% stenosed. Left Carotid: The extracranial vessels were near-normal with only minimal wall               thickening or plaque. *See table(s) above for measurements and  observations.  Progression of disease noted in the right ICA when compared to the previous exam on 06/30/22 with the left side remaining stable. Electronically signed by Hortencia Pilar MD on 11/20/2022 at 4:31:14 PM.    Final      ASSESSMENT & PLAN:   Primary cancer of left lower lobe of lung ([place]) #JUNE 2023-STAGE IV--recurrent/metastatic disease with New left adrenal metastasis;  PET scan JUNE 20th- 2023-  Enlarged 4 cm hypermetabolic LEFT adrenal metastasis;  Suspicion of metastatic adenopathy to the LEFT hilum.  Part solid nodule in the RIGHT lower lobe without metabolic activity. Findings are suspicious for indolent adenocarcinoma.  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 Currently on single agent Keytruda [PD-L1 greater than  80% ] ;  OCT 24th, 2023-  Interval response to therapy as evidenced by marked decrease in size of a left adrenal metastasis. Ground-glass nodules in the right lower lobe, 1 of which may have a developing internal solid component. STABLE.   # Proceed with cycle #6  of Keytruda single agent. Labs today reviewed;  acceptable for treatment today. Tolerating well except for mild chills. Will plan re-imaging in 3 months or so. Will repeat CT scan today.   # Chronic headaches/cervical pain/ visual blurriness- not new. On NSAIDs [; monitor for now. OCT 2023- MRI brain- NEGATIVE for any metastatic disease.  Declined referral to neurology/given previous extensive workup.  STABLE.   # Post throacotomy pain/tingling and numbness:  Continue gabapentin -300 mg TID; and then at extra at night. STABLE.  # CAD/ PVD- cramping- s/p evaluation [Dr.Schneir] dec 2023  STABLE.  # COPD-stable encouraged continue to avoid smoking; again counseled to quit smoking; recommend evaluation with pulmonary. STABLE.  #Right mildly complex cystic adnexal mass noted- s/p prior pelvic ultrasound  for further work-up [s/p Gyn-Onc; Dr.Berchuck; felt high risk for surgery]- monitor for now. STABLE.  # IV Access :s/p  port placement- STABLE.  # Vaccinations: s/p Pneumonia shot [2022] flu shot/covid- declines  *AM appts- Monday-   # DISPOSITION: # keytruda today #Follow-up in 3 weeks-MD labs/port- cbc/cmp; Keytruda; TSH; CT CAP- Dr. .B        All questions were answered. The patient knows to call the clinic with any problems, questions or concerns.    Cammie Sickle, MD 12/05/2022 9:42 AM

## 2022-12-10 ENCOUNTER — Other Ambulatory Visit: Payer: Self-pay

## 2022-12-20 ENCOUNTER — Ambulatory Visit
Admission: RE | Admit: 2022-12-20 | Discharge: 2022-12-20 | Disposition: A | Discharge status: Home / Self Care | Payer: Medicare Other | Source: Ambulatory Visit | Attending: Internal Medicine | Admitting: Internal Medicine

## 2022-12-20 DIAGNOSIS — C7492 Malignant neoplasm of unspecified part of left adrenal gland: Secondary | ICD-10-CM | POA: Diagnosis not present

## 2022-12-20 DIAGNOSIS — C3432 Malignant neoplasm of lower lobe, left bronchus or lung: Secondary | ICD-10-CM | POA: Diagnosis not present

## 2022-12-20 DIAGNOSIS — C7972 Secondary malignant neoplasm of left adrenal gland: Secondary | ICD-10-CM | POA: Diagnosis not present

## 2022-12-20 DIAGNOSIS — R590 Localized enlarged lymph nodes: Secondary | ICD-10-CM | POA: Diagnosis not present

## 2022-12-20 DIAGNOSIS — C3490 Malignant neoplasm of unspecified part of unspecified bronchus or lung: Secondary | ICD-10-CM | POA: Diagnosis not present

## 2022-12-20 DIAGNOSIS — I700 Atherosclerosis of aorta: Secondary | ICD-10-CM | POA: Diagnosis not present

## 2022-12-20 MED ORDER — IOHEXOL 300 MG/ML  SOLN
100.0000 mL | Freq: Once | INTRAMUSCULAR | Status: AC | PRN
  Administered 2022-12-20: 100 mL via INTRAVENOUS

## 2022-12-25 ENCOUNTER — Inpatient Hospital Stay (HOSPITAL_BASED_OUTPATIENT_CLINIC_OR_DEPARTMENT_OTHER): Payer: Medicare Other | Admitting: Internal Medicine

## 2022-12-25 ENCOUNTER — Inpatient Hospital Stay: Payer: Medicare Other

## 2022-12-25 ENCOUNTER — Encounter: Payer: Self-pay | Admitting: Internal Medicine

## 2022-12-25 VITALS — BP 145/81 | HR 71 | Temp 96.7°F | Resp 18 | Wt 217.0 lb

## 2022-12-25 DIAGNOSIS — C3432 Malignant neoplasm of lower lobe, left bronchus or lung: Secondary | ICD-10-CM

## 2022-12-25 DIAGNOSIS — Z87891 Personal history of nicotine dependence: Secondary | ICD-10-CM | POA: Diagnosis not present

## 2022-12-25 DIAGNOSIS — Z79899 Other long term (current) drug therapy: Secondary | ICD-10-CM | POA: Diagnosis not present

## 2022-12-25 DIAGNOSIS — C7972 Secondary malignant neoplasm of left adrenal gland: Secondary | ICD-10-CM | POA: Diagnosis not present

## 2022-12-25 DIAGNOSIS — Z5112 Encounter for antineoplastic immunotherapy: Secondary | ICD-10-CM | POA: Diagnosis not present

## 2022-12-25 LAB — COMPREHENSIVE METABOLIC PANEL
ALT: 15 U/L (ref 0–44)
AST: 22 U/L (ref 15–41)
Albumin: 4.1 g/dL (ref 3.5–5.0)
Alkaline Phosphatase: 90 U/L (ref 38–126)
Anion gap: 11 (ref 5–15)
BUN: 19 mg/dL (ref 8–23)
CO2: 26 mmol/L (ref 22–32)
Calcium: 9.2 mg/dL (ref 8.9–10.3)
Chloride: 101 mmol/L (ref 98–111)
Creatinine, Ser: 0.56 mg/dL (ref 0.44–1.00)
GFR, Estimated: >60 mL/min (ref >60)
Glucose, Bld: 106 mg/dL — ABNORMAL HIGH (ref 70–99)
Potassium: 3.4 mmol/L — ABNORMAL LOW (ref 3.5–5.1)
Sodium: 138 mmol/L (ref 135–145)
Total Bilirubin: 0.7 mg/dL (ref 0.3–1.2)
Total Protein: 7.2 g/dL (ref 6.5–8.1)

## 2022-12-25 LAB — CBC WITH DIFFERENTIAL/PLATELET
Abs Immature Granulocytes: 0.03 10*3/uL (ref 0.00–0.07)
Basophils Absolute: 0.1 10*3/uL (ref 0.0–0.1)
Basophils Relative: 1 %
Eosinophils Absolute: 0.3 10*3/uL (ref 0.0–0.5)
Eosinophils Relative: 3 %
HCT: 44.1 % (ref 36.0–46.0)
Hemoglobin: 15.2 g/dL — ABNORMAL HIGH (ref 12.0–15.0)
Immature Granulocytes: 0 %
Lymphocytes Relative: 38 %
Lymphs Abs: 3.6 10*3/uL (ref 0.7–4.0)
MCH: 28.3 pg (ref 26.0–34.0)
MCHC: 34.5 g/dL (ref 30.0–36.0)
MCV: 82.1 fL (ref 80.0–100.0)
Monocytes Absolute: 0.6 10*3/uL (ref 0.1–1.0)
Monocytes Relative: 6 %
Neutro Abs: 5 10*3/uL (ref 1.7–7.7)
Neutrophils Relative %: 52 %
Platelets: 214 10*3/uL (ref 150–400)
RBC: 5.37 MIL/uL — ABNORMAL HIGH (ref 3.87–5.11)
RDW: 13.8 % (ref 11.5–15.5)
WBC: 9.6 10*3/uL (ref 4.0–10.5)
nRBC: 0 % (ref 0.0–0.2)

## 2022-12-25 LAB — TSH: TSH: 3.75 u[IU]/mL (ref 0.350–4.500)

## 2022-12-25 MED ORDER — SODIUM CHLORIDE 0.9 % IV SOLN
200.0000 mg | Freq: Once | INTRAVENOUS | Status: AC
  Administered 2022-12-25: 200 mg via INTRAVENOUS
  Filled 2022-12-25: qty 8

## 2022-12-25 MED ORDER — SODIUM CHLORIDE 0.9% FLUSH
10.0000 mL | INTRAVENOUS | Status: DC | PRN
  Administered 2022-12-25: 10 mL
  Filled 2022-12-25: qty 10

## 2022-12-25 MED ORDER — SODIUM CHLORIDE 0.9 % IV SOLN
Freq: Once | INTRAVENOUS | Status: AC
  Filled 2022-12-25: qty 250

## 2022-12-25 MED ORDER — HEPARIN SOD (PORK) LOCK FLUSH 100 UNIT/ML IV SOLN
500.0000 [IU] | Freq: Once | INTRAVENOUS | Status: AC | PRN
  Administered 2022-12-25: 500 [IU]
  Filled 2022-12-25: qty 5

## 2022-12-25 NOTE — Progress Notes (Signed)
[name] CONSULT NOTE  Patient Care Team: Malva Limes, MD as PCP - General (Family Medicine) Lady Gary Darlin Priestly, MD as Consulting Physician (Cardiology) Schnier, Latina Craver, MD (Vascular Surgery) Lonell Face, MD as Consulting Physician (Neurology) Delano Metz, MD as Referring Physician (Pain Medicine) Pa, [facility] (Optometry) Lady Gary, Darlin Priestly, MD as Consulting Physician (Cardiology) Pasty Spillers, MD (Inactive) as Consulting Physician (Gastroenterology) Nadara Mustard, MD as Referring Physician (Obstetrics and Gynecology) Glory Buff, RN as Oncology Nurse Navigator Earna Coder, MD as Consulting Physician (Oncology)   CHIEF COMPLAINTS/PURPOSE OF CONSULTATION: lung cancer  #  Oncology History Overview Note  #MAY 2022- LLL nodule 2.3 cm Adenocarcinoma Stage IA; s/p lobectomy.  No adjuvant therapy  # CT scan 4th June 2023-highly concerning for recurrent/metastatic disease with-. New left adrenal metastasis;  Ground-glass and part solid nodules in the peripheral right lower lobe, unchanged from 11/07/2021 but slightly enlarged from 01/01/2018. Findings are suspicious for indolent adenocarcinoma.  F One- PFL1 =80%; KRAS G12C PET scan JUNE 20th- 2023-  Enlarged 4 cm hypermetabolic LEFT adrenal metastasis;  Suspicion of metastatic adenopathy to the LEFT hilum.  Part solid nodule in the RIGHT lower lobe without metabolic activity.   3 ADRENAL BIOPSY- CK7 POSITIVE ADENOCARCINOMA- There is limited tissue remaining for ancillary testing, not likely  sufficient for NGS testing.   # AUG 7th, 2023-single agent Keytruda.    Primary cancer of left lower lobe of lung (HCC)  05/09/2021 Initial Diagnosis   Primary cancer of left lower lobe of lung (HCC)   07/04/2022 -  Chemotherapy   Patient is on Treatment Plan : LUNG NSCLC Pembrolizumab (200) q21d     07/10/2022 - 07/10/2022 Chemotherapy   Patient is on Treatment Plan : LUNG NSCLC Pembrolizumab  (200) q21d        HISTORY OF PRESENTING ILLNESS: Alone.  Ambulating independently.  Laura Nielsen 67 y.o.  female history of smoking; prior history of lung cancer-with likely recurrence to left adrenal gland [s/p Bx CK-7 positive TTF-1 negative]-clinically suggestive of recurrent lung cancer on single agent Rande Lawman is here for follow-up/ review result of CT scan.   Had recent CT, Has noticed some tenderness and soreness to touch on her left lower rib. Appetite is fair. Nothing sounds good. Constipation is relieved by enema's. Chronic back pain is bette   Continues to complain of chronic joint pains not any worse. Denies any diarrhea.  Denies any abdominal pain. Otherwise patient continues to have mild shortness of breath on exertion.  Denies any headaches.  Denies any nausea vomiting. Complains of cramping in the legs chronic.   Review of Systems  Constitutional:  Negative for chills, diaphoresis, fever, malaise/fatigue and weight loss.  HENT:  Negative for nosebleeds and sore throat.   Eyes:  Negative for double vision.  Respiratory:  Negative for cough, hemoptysis, sputum production, shortness of breath and wheezing.   Cardiovascular:  Positive for chest pain. Negative for palpitations, orthopnea and leg swelling.  Gastrointestinal:  Negative for abdominal pain, blood in stool, constipation, diarrhea, heartburn, melena, nausea and vomiting.  Genitourinary:  Negative for dysuria, frequency and urgency.  Musculoskeletal:  Positive for back pain and joint pain.  Skin: Negative.  Negative for itching and rash.  Neurological:  Positive for tingling. Negative for dizziness, focal weakness, weakness and headaches.  Endo/Heme/Allergies:  Does not bruise/bleed easily.  Psychiatric/Behavioral:  Negative for depression. The patient is not nervous/anxious and does not have insomnia.  MEDICAL HISTORY:  Past Medical History:  Diagnosis Date   AAA (abdominal aortic aneurysm) (HCC)    s/p EVAR  AAA 03/27/13   Arthritis    Asthma    Complication of anesthesia    BP drops after   History of kidney stones    Osteoporosis    Sleep apnea     SURGICAL HISTORY: Past Surgical History:  Procedure Laterality Date   ABDOMINAL AORTIC ENDOVASCULAR STENT GRAFT  03/27/2013   Dr. Levora Dredge   Cardiac catheterization  02/2009   70-80% stenosis RCA stent placed. started on Plavix   CARDIAC CATHETERIZATION     COLONOSCOPY WITH PROPOFOL N/A 01/05/2021   Procedure: COLONOSCOPY WITH PROPOFOL;  Surgeon: Pasty Spillers, MD;  Location: [facility] ENDOSCOPY;  Service: Endoscopy;  Laterality: N/A;   CORONARY ANGIOPLASTY     stent placed   ESOPHAGOGASTRODUODENOSCOPY (EGD) WITH PROPOFOL N/A 01/05/2021   Procedure: ESOPHAGOGASTRODUODENOSCOPY (EGD) WITH PROPOFOL;  Surgeon: Pasty Spillers, MD;  Location: [facility] ENDOSCOPY;  Service: Endoscopy;  Laterality: N/A;   INTERCOSTAL NERVE BLOCK Left 04/06/2021   Procedure: INTERCOSTAL NERVE BLOCK;  Surgeon: Loreli Slot, MD;  Location: [facility] OR;  Service: Thoracic;  Laterality: Left;   IR IMAGING GUIDED PORT INSERTION  06/20/2022   KIDNEY STONE SURGERY  1999   LUNG LOBECTOMY Left 04/06/2021   robotic left lower lobectomy 04/06/2021 Dr. Dorris Fetch for Stage 1A adenocarcinoma   NODE DISSECTION Left 04/06/2021   Procedure: NODE DISSECTION;  Surgeon: Loreli Slot, MD;  Location: [facility] OR;  Service: Thoracic;  Laterality: Left;   TUBAL LIGATION     VAGINAL HYSTERECTOMY     Menometrorrhagia. Excessive bleeding. Unknown if cervix removed.     SOCIAL HISTORY: Social History   Socioeconomic History   Marital status: Divorced    Spouse name: Not on file   Number of children: 2   Years of education: H/S   Highest education level: High school graduate  Occupational History   Occupation: Disabled   Occupation: retired  Tobacco Use   Smoking status: Former    Packs/day: 0.25    Years: 44.50    Total pack years: 11.13    Types: Cigarettes     Quit date: 04/05/2021    Years since quitting: 1.7   Smokeless tobacco: Never   Tobacco comments:    12/23/19 states she quit for 3 months and then started back. Pt is going to talk with MD about this.  Vaping Use   Vaping Use: Never used  Substance and Sexual Activity   Alcohol use: Yes    Comment: rarely - once a year   Drug use: No   Sexual activity: Not on file  Other Topics Concern   Not on file  Social History Narrative   Hx of smoking; quit prior to lung surgery. Lives in [place]- self. No alcohol. Used to work in [employer], [employer]- Facilities manager. On disability sec to spinal pain.    Social Determinants of Health   Financial Resource Strain: Low Risk  (02/10/2021)   Overall Financial Resource Strain (CARDIA)    Difficulty of Paying Living Expenses: Not hard at all  Food Insecurity: No Food Insecurity (03/21/2022)   Hunger Vital Sign    Worried About Running Out of Food in the Last Year: Never true    Ran Out of Food in the Last Year: Never true  Transportation Needs: No Transportation Needs (03/21/2022)   PRAPARE - Administrator, Civil Service (Medical): No  Lack of Transportation (Non-Medical): No  Physical Activity: Inactive (02/10/2021)   Exercise Vital Sign    Days of Exercise per Week: 0 days    Minutes of Exercise per Session: 0 min  Stress: Stress Concern Present (02/10/2021)   [employer] of Occupational Health - Occupational Stress Questionnaire    Feeling of Stress : To some extent  Social Connections: Socially Isolated (02/10/2021)   Social Connection and Isolation Panel [NHANES]    Frequency of Communication with Friends and Family: More than three times a week    Frequency of Social Gatherings with Friends and Family: Once a week    Attends Religious Services: Never    Database administrator or Organizations: No    Attends Banker Meetings: Never    Marital Status: Divorced  Catering manager Violence: Not At Risk (02/10/2021)    Humiliation, Afraid, Rape, and Kick questionnaire    Fear of Current or Ex-Partner: No    Emotionally Abused: No    Physically Abused: No    Sexually Abused: No    FAMILY HISTORY: Family History  Problem Relation Age of Onset   Hypertension Mother    Coronary artery disease Mother    Heart attack Mother        acute   Cancer Mother    Alcohol abuse Father    Depression Father    Hypertension Father    Heart attack Father 21       acute   Alcohol abuse Sister    Hyperlipidemia Sister    Hypertension Sister    Cancer Sister 72   Heart attack Sister        x's 2   Coronary artery disease Sister 29       x's 2   Breast cancer Sister 92    ALLERGIES:  is allergic to atorvastatin, omeprazole, and bupropion.  MEDICATIONS:  Current Outpatient Medications  Medication Sig Dispense Refill   allopurinol (ZYLOPRIM) 100 MG tablet Take 1 tablet (100 mg total) by mouth daily. 90 tablet 3   aspirin 81 MG tablet Take 81 mg by mouth daily.      bisoprolol (ZEBETA) 10 MG tablet Take 1 tablet (10 mg total) by mouth daily. 90 tablet 4   Cholecalciferol 25 MCG (1000 UT) capsule Take 1,000 Units by mouth daily.     clopidogrel (PLAVIX) 75 MG tablet Take 1 tablet (75 mg total) by mouth daily. 90 tablet 4   ibuprofen (ADVIL) 200 MG tablet Take 400 mg by mouth 2 (two) times daily as needed (headaches).     lidocaine-prilocaine (EMLA) cream Apply on the port. 30 -45 min  prior to port access. 30 g 3   lisinopril-hydrochlorothiazide (ZESTORETIC) 20-12.5 MG tablet Take 1 tablet by mouth daily. 90 tablet 0   OZEMPIC, 1 MG/DOSE, 4 MG/3ML SOPN Inject 1 mg into the skin once a week. 3 mL 4   pantoprazole (PROTONIX) 20 MG tablet Take 1 tablet (20 mg total) by mouth daily. 90 tablet 4   PROAIR HFA 108 (90 Base) MCG/ACT inhaler Inhale 2 puffs into the lungs every 6 (six) hours as needed for wheezing or shortness of breath. 8.5 g 4   rosuvastatin (CRESTOR) 20 MG tablet Take 1 tablet (20 mg total) by mouth  daily. 90 tablet 0   azelastine (ASTELIN) 0.1 % nasal spray Place 2 sprays into both nostrils 2 (two) times daily. Use in each nostril as directed (Patient not taking: Reported on 09/11/2022) 30 mL 2  cyanocobalamin 1000 MCG tablet Take 1,000 mcg by mouth daily. (Patient not taking: Reported on 12/05/2022)     fluticasone (FLONASE) 50 MCG/ACT nasal spray Place into both nostrils daily. (Patient not taking: Reported on 09/11/2022)     gabapentin (NEURONTIN) 300 MG capsule Take 1 capsule (300 mg total) by mouth 3 (three) times daily AND 2 capsules (600 mg total) at bedtime. (Patient not taking: Reported on 12/25/2022)     montelukast (SINGULAIR) 10 MG tablet Take 1 tablet (10 mg total) by mouth at bedtime. (Patient not taking: Reported on 10/23/2022) 30 tablet 3   No current facility-administered medications for this visit.   Facility-Administered Medications Ordered in Other Visits  Medication Dose Route Frequency Provider Last Rate Last Admin   heparin lock flush 100 UNIT/ML injection            heparin lock flush 100 unit/mL  500 Units Intracatheter Once PRN Earna Coder, MD       pembrolizumab [facility]) 200 mg in sodium chloride 0.9 % 50 mL chemo infusion  200 mg Intravenous Once Louretta Shorten R, MD       sodium chloride flush (NS) 0.9 % injection 10 mL  10 mL Intracatheter PRN Earna Coder, MD        Mild maculopapular rash on the Maller area left more than right.  PHYSICAL EXAMINATION: ECOG PERFORMANCE STATUS: 1 - Symptomatic but completely ambulatory  Vitals:   12/25/22 1058  BP: (!) 145/81  Pulse: 71  Resp: 18  Temp: (!) 96.7 F (35.9 C)  SpO2: 99%   Filed Weights   12/25/22 1058  Weight: 217 lb (98.4 kg)    Physical Exam HENT:     Head: Normocephalic and atraumatic.     Mouth/Throat:     Pharynx: No oropharyngeal exudate.  Eyes:     Pupils: Pupils are equal, round, and reactive to light.  Cardiovascular:     Rate and Rhythm: Normal rate and  regular rhythm.  Pulmonary:     Comments: Decreased breath sounds bilaterally.  No wheeze or crackles Abdominal:     General: Bowel sounds are normal. There is no distension.     Palpations: Abdomen is soft. There is no mass.     Tenderness: There is no abdominal tenderness. There is no guarding or rebound.  Musculoskeletal:        General: No tenderness. Normal range of motion.     Cervical back: Normal range of motion and neck supple.  Skin:    General: Skin is warm.  Neurological:     Mental Status: She is alert and oriented to person, place, and time.  Psychiatric:        Mood and Affect: Affect normal.      LABORATORY DATA:  I have reviewed the data as listed Lab Results  Component Value Date   WBC 9.6 12/25/2022   HGB 15.2 (H) 12/25/2022   HCT 44.1 12/25/2022   MCV 82.1 12/25/2022   PLT 214 12/25/2022   Recent Labs    11/13/22 1018 12/05/22 0756 12/25/22 1037  NA 141 140 138  K 3.5 3.4* 3.4*  CL 105 106 101  CO2 25 26 26   GLUCOSE 100* 108* 106*  BUN 20 18 19   CREATININE 0.56 0.65 0.56  CALCIUM 9.3 9.3 9.2  GFRNONAA >60 >60 >60  PROT 6.9 7.2 7.2  ALBUMIN 3.9 4.0 4.1  AST 23 22 22   ALT 18 16 15   ALKPHOS 87 92 90  BILITOT  0.8 0.9 0.7    RADIOGRAPHIC STUDIES: I have personally reviewed the radiological images as listed and agreed with the findings in the report. CT CHEST ABDOMEN PELVIS W CONTRAST  Result Date: 12/20/2022 CLINICAL DATA:  Non-small cell lung cancer. LEFT lower lobectomy. Recurrence with LEFT adrenal RIGHT ovary metastasis assess treatment response. * Tracking Code: BO * EXAM: CT CHEST, ABDOMEN, AND PELVIS WITH CONTRAST TECHNIQUE: Multidetector CT imaging of the chest, abdomen and pelvis was performed following the standard protocol during bolus administration of intravenous contrast. RADIATION DOSE REDUCTION: This exam was performed according to the departmental dose-optimization program which includes automated exposure control, adjustment of  the mA and/or kV according to patient size and/or use of iterative reconstruction technique. CONTRAST:  OMNIPAQUE IOHEXOL 300 MG/ML  SOLN COMPARISON:  CT 09/26/2022 FINDINGS: CT CHEST FINDINGS Cardiovascular: Coronary artery calcification and aortic atherosclerotic calcification. Mediastinum/Nodes: Calcified mediastinal lymph nodes again noted. Bilateral prominent hilar lymph nodes are again noted unchanged. For example 10 mm RIGHT hilar node on image 28/2 unchanged from 10 mm. Lungs/Pleura: Several ground-glass nodules in the RIGHT lower lobe again demonstrated. Example nodule measures 12 mm (image 96/3) not changed from 12 mm. A peripheral nodule RIGHT lower lobe measuring 8 mm (image 89/3) is also unchanged in size. No new ground-glass nodules. Musculoskeletal: No aggressive osseous lesion. CT ABDOMEN AND PELVIS FINDINGS Hepatobiliary: No focal hepatic lesion. No biliary ductal dilatation. Gallbladder is normal. Common bile duct is normal. Pancreas: Pancreas is normal. No ductal dilatation. No pancreatic inflammation. Spleen: Normal spleen Adrenals/urinary tract: LEFT adrenal gland is normal size. Previously enlarged LEFT adrenal metastasis is no longer identifiable. Normal RIGHT adrenal gland. Kidneys normal. Bladder normal. Stomach/Bowel: Stomach, small bowel, appendix, and cecum are normal. The colon and rectosigmoid colon are normal. Vascular/Lymphatic: Aortic stent graft noted. No abdominopelvic lymphadenopathy. Reproductive: Large cystic lesion of the RIGHT adnexa increased in volume measuring 8.1 x 5.8 cm increased from 6.3 by 4.3 cm. This lesion has simple fluid attenuation internally without clear complexity. No metabolic activity on comparison FDG PET scan. Other: No free fluid. Musculoskeletal: No aggressive osseous lesion. IMPRESSION: CHEST IMPRESSION: 1. Stable ground-glass nodules in the RIGHT lower lobe. 2. Stable borderline hilar adenopathy. 3. No evidence of lung cancer recurrence. PELVIS  IMPRESSION: 1. Resolution of LEFT adrenal metastasis. 2. No evidence of new or progressive metastatic disease in the abdomen pelvis. 3. Interval increase in size of RIGHT adnexal cystic lesion. Recommend pelvic ultrasound for further characterization. 4.  Aortic Atherosclerosis (ICD10-I70.0). Electronically Signed   By: Genevive Bi M.D.   On: 12/20/2022 14:41     ASSESSMENT & PLAN:   Primary cancer of left lower lobe of lung (HCC) #JUNE 2023-STAGE IV--recurrent/metastatic disease with New left adrenal metastasis;  PET scan JUNE 20th- 2023-  Enlarged 4 cm hypermetabolic LEFT adrenal metastasis;  Suspicion of metastatic adenopathy to the LEFT hilum.  Part solid nodule in the RIGHT lower lobe without metabolic activity. Findings are suspicious for indolent adenocarcinoma.  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 Currently on single agent Keytruda [PD-L1 greater than 80% ] ;  JNA 17th, 2024-  Stable ground-glass nodules in the RIGHT lower lobe;  Stable borderline hilar adenopathy; No evidence of lung cancer recurrence. Resolution of LEFT adrenal metastasis; No evidence of new or progressive metastatic disease in the abdomen pelvis.  # Proceed with cycle #7 of Keytruda single agent. Labs today reviewed;  acceptable for treatment today. Tolerating well except for mild chills.   # Chronic headaches/cervical pain/ visual blurriness- not new. On NSAIDs [; monitor for now. OCT 2023- MRI brain- NEGATIVE for any metastatic disease.  Declined referral to neurology/given previous extensive workup.  Stable.   # Left ib pain-Post throacotomy pain/tingling and numbness:  Continue gabapentin -300 mg TID; and then at extra at night. Stable.   # CAD/ PVD- cramping- s/p evaluation [Dr.Schneir] dec 2023 stable.   # COPD-stable encouraged continue to avoid smoking; again counseled to quit smoking;  recommend evaluation with pulmonary. Stable.   #Right mildly complex cystic adnexal mass noted- s/p prior pelvic ultrasound for further work-up [s/p Gyn-Onc; Dr.Berchuck; felt high risk for surgery] 8.1 x 5.8 cm increased from 6.3 by 4.3 cm. Will discuss  with Gyn-Onc- monitor for now. Sent message to gyn-Onc.   # IV Access :s/p  port placement- stable.   # Vaccinations: s/p Pneumonia shot [2022] flu shot/covid- declines  *AM appts- Monday-   # DISPOSITION: # keytruda today #Follow-up in 3 weeks-MD labs/port- cbc/cmp; Keytruda; Dr. .B  # I reviewed the blood work- with the patient in detail; also reviewed the imaging independently [as summarized above]; and with the patient in detail.     All questions were answered. The patient knows to call the clinic with any problems, questions or concerns.    Earna Coder, MD 12/25/2022 11:42 AM

## 2022-12-25 NOTE — Progress Notes (Signed)
Had recent CT, Has noticed some tenderness and soreness to touch on her left lower rib. Appetite is fair. Nothing sounds good. Constipation is relieved by enema's. Chronic back pain is better.

## 2022-12-25 NOTE — Assessment & Plan Note (Signed)
#  JUNE 2023-STAGE IV--recurrent/metastatic disease with New left adrenal metastasis;  PET scan JUNE 20th- 2023-  Enlarged 4 cm hypermetabolic LEFT adrenal metastasis;  Suspicion of metastatic adenopathy to the LEFT hilum.  Part solid nodule in the RIGHT lower lobe without metabolic activity. Findings are suspicious for indolent adenocarcinoma.  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 Currently on single agent Keytruda [PD-L1 greater than 80% ] ;  JNA 17th, 2024-  Stable ground-glass nodules in the RIGHT lower lobe;  Stable borderline hilar adenopathy; No evidence of lung cancer recurrence. Resolution of LEFT adrenal metastasis; No evidence of new or progressive metastatic disease in the abdomen pelvis.  # Proceed with cycle #7 of Keytruda single agent. Labs today reviewed;  acceptable for treatment today. Tolerating well except for mild chills.   # Chronic headaches/cervical pain/ visual blurriness- not new. On NSAIDs [; monitor for now. OCT 2023- MRI brain- NEGATIVE for any metastatic disease.  Declined referral to neurology/given previous extensive workup.  Stable.   # Left ib pain-Post throacotomy pain/tingling and numbness:  Continue gabapentin -300 mg TID; and then at extra at night. Stable.   # CAD/ PVD- cramping- s/p evaluation [Dr.Schneir] dec 2023 stable.   # COPD-stable encouraged continue to avoid smoking; again counseled to quit smoking; recommend evaluation with pulmonary. Stable.   #Right mildly complex cystic adnexal mass noted- s/p prior pelvic ultrasound for further work-up [s/p Gyn-Onc; Dr.Berchuck; felt high risk for surgery] 8.1 x 5.8 cm increased from 6.3 by 4.3 cm. Will discuss  with Gyn-Onc- monitor for now. Sent message to gyn-Onc.   # IV Access :s/p  port placement- stable.   # Vaccinations: s/p Pneumonia shot [2022] flu shot/covid- declines  *AM appts- Monday-    # DISPOSITION: # keytruda today #Follow-up in 3 weeks-MD labs/port- cbc/cmp; Keytruda; Dr. .B  # I reviewed the blood work- with the patient in detail; also reviewed the imaging independently [as summarized above]; and with the patient in detail.

## 2022-12-25 NOTE — Patient Instructions (Signed)
[place] [facility]  Discharge Instructions: Thank you for choosing [facility] to provide your oncology and hematology care.  If you have a lab appointment with the Cancer Center, please go directly to the Cancer Center and check in at the registration area.  Wear comfortable clothing and clothing appropriate for easy access to any Portacath or PICC line.   We strive to give you quality time with your provider. You may need to reschedule your appointment if you arrive late (15 or more minutes).  Arriving late affects you and other patients whose appointments are after yours.  Also, if you miss three or more appointments without notifying the office, you may be dismissed from the clinic at the provider's discretion.      For prescription refill requests, have your pharmacy contact our office and allow 72 hours for refills to be completed.    Today you received the following chemotherapy and/or immunotherapy agents- Keytruda      To help prevent nausea and vomiting after your treatment, we encourage you to take your nausea medication as directed.  BELOW ARE SYMPTOMS THAT SHOULD BE REPORTED IMMEDIATELY: *FEVER GREATER THAN 100.4 F (38 C) OR HIGHER *CHILLS OR SWEATING *NAUSEA AND VOMITING THAT IS NOT CONTROLLED WITH YOUR NAUSEA MEDICATION *UNUSUAL SHORTNESS OF BREATH *UNUSUAL BRUISING OR BLEEDING *URINARY PROBLEMS (pain or burning when urinating, or frequent urination) *BOWEL PROBLEMS (unusual diarrhea, constipation, pain near the anus) TENDERNESS IN MOUTH AND THROAT WITH OR WITHOUT PRESENCE OF ULCERS (sore throat, sores in mouth, or a toothache) UNUSUAL RASH, SWELLING OR PAIN  UNUSUAL VAGINAL DISCHARGE OR ITCHING   Items with * indicate a potential emergency and should be followed up as soon as possible or go to the Emergency Department if any problems should occur.  Please show the CHEMOTHERAPY ALERT CARD or IMMUNOTHERAPY ALERT CARD at check-in to  the Emergency Department and triage nurse.  Should you have questions after your visit or need to cancel or reschedule your appointment, please contact [place] [facility]  [phone] and follow the prompts.  Office hours are 8:00 a.m. to 4:30 p.m. Monday - Friday. Please note that voicemails left after 4:00 p.m. may not be returned until the following business day.  We are closed weekends and major holidays. You have access to a nurse at all times for urgent questions. Please call the main number to the clinic [phone] and follow the prompts.  For any non-urgent questions, you may also contact your provider using MyChart. We now offer e-Visits for anyone 6 and older to request care online for non-urgent symptoms. For details visit [email or web address].   Also download the MyChart app! Go to the app store, search "MyChart", open the app, select [facility], and log in with your MyChart username and password.

## 2022-12-26 LAB — T4: T4, Total: 6.5 ug/dL (ref 4.5–12.0)

## 2022-12-29 ENCOUNTER — Other Ambulatory Visit: Payer: Self-pay | Admitting: Family Medicine

## 2023-01-05 ENCOUNTER — Telehealth: Payer: Self-pay | Admitting: Family Medicine

## 2023-01-05 ENCOUNTER — Other Ambulatory Visit: Payer: Self-pay

## 2023-01-05 NOTE — Telephone Encounter (Signed)
Start PA- (Key: [id number]) [phone] Ozempic (1 MG/DOSE) 4MG Fayne Mediate pen-injectors

## 2023-01-05 NOTE — Telephone Encounter (Signed)
Pt called saying she went to the pharmacy yesterday to check on the status of the Ozempic and they told her they had reached out to the office about needing a PA.  I do not see where that has been done.  Please advise patient   CB#  [phone]

## 2023-01-14 ENCOUNTER — Emergency Department: Payer: Medicare Other

## 2023-01-14 ENCOUNTER — Other Ambulatory Visit: Payer: Self-pay

## 2023-01-14 ENCOUNTER — Emergency Department
Admission: EM | Admit: 2023-01-14 | Discharge: 2023-01-14 | Disposition: A | Discharge status: Home / Self Care | Payer: Medicare Other | Attending: Emergency Medicine | Admitting: Emergency Medicine

## 2023-01-14 DIAGNOSIS — G8929 Other chronic pain: Secondary | ICD-10-CM

## 2023-01-14 DIAGNOSIS — S39012A Strain of muscle, fascia and tendon of lower back, initial encounter: Secondary | ICD-10-CM | POA: Diagnosis not present

## 2023-01-14 DIAGNOSIS — C3490 Malignant neoplasm of unspecified part of unspecified bronchus or lung: Secondary | ICD-10-CM | POA: Diagnosis not present

## 2023-01-14 DIAGNOSIS — M8588 Other specified disorders of bone density and structure, other site: Secondary | ICD-10-CM | POA: Diagnosis not present

## 2023-01-14 DIAGNOSIS — N281 Cyst of kidney, acquired: Secondary | ICD-10-CM | POA: Diagnosis not present

## 2023-01-14 DIAGNOSIS — M549 Dorsalgia, unspecified: Secondary | ICD-10-CM | POA: Diagnosis not present

## 2023-01-14 DIAGNOSIS — X58XXXA Exposure to other specified factors, initial encounter: Secondary | ICD-10-CM | POA: Insufficient documentation

## 2023-01-14 DIAGNOSIS — R9431 Abnormal electrocardiogram [ECG] [EKG]: Secondary | ICD-10-CM | POA: Diagnosis not present

## 2023-01-14 DIAGNOSIS — M545 Low back pain, unspecified: Secondary | ICD-10-CM | POA: Diagnosis not present

## 2023-01-14 DIAGNOSIS — N83291 Other ovarian cyst, right side: Secondary | ICD-10-CM | POA: Diagnosis not present

## 2023-01-14 DIAGNOSIS — N838 Other noninflammatory disorders of ovary, fallopian tube and broad ligament: Secondary | ICD-10-CM | POA: Insufficient documentation

## 2023-01-14 DIAGNOSIS — I10 Essential (primary) hypertension: Secondary | ICD-10-CM | POA: Diagnosis not present

## 2023-01-14 DIAGNOSIS — K5730 Diverticulosis of large intestine without perforation or abscess without bleeding: Secondary | ICD-10-CM | POA: Diagnosis not present

## 2023-01-14 DIAGNOSIS — J984 Other disorders of lung: Secondary | ICD-10-CM | POA: Diagnosis not present

## 2023-01-14 DIAGNOSIS — N83201 Unspecified ovarian cyst, right side: Secondary | ICD-10-CM | POA: Diagnosis not present

## 2023-01-14 DIAGNOSIS — I2510 Atherosclerotic heart disease of native coronary artery without angina pectoris: Secondary | ICD-10-CM | POA: Diagnosis not present

## 2023-01-14 HISTORY — DX: Malignant (primary) neoplasm, unspecified: C80.1

## 2023-01-14 LAB — CBC
HCT: 45 % (ref 36.0–46.0)
Hemoglobin: 14.9 g/dL (ref 12.0–15.0)
MCH: 28.5 pg (ref 26.0–34.0)
MCHC: 33.1 g/dL (ref 30.0–36.0)
MCV: 86.2 fL (ref 80.0–100.0)
Platelets: 179 10*3/uL (ref 150–400)
RBC: 5.22 MIL/uL — ABNORMAL HIGH (ref 3.87–5.11)
RDW: 13.6 % (ref 11.5–15.5)
WBC: 7.7 10*3/uL (ref 4.0–10.5)
nRBC: 0 % (ref 0.0–0.2)

## 2023-01-14 LAB — COMPREHENSIVE METABOLIC PANEL
ALT: 18 U/L (ref 0–44)
AST: 25 U/L (ref 15–41)
Albumin: 3.9 g/dL (ref 3.5–5.0)
Alkaline Phosphatase: 81 U/L (ref 38–126)
Anion gap: 8 (ref 5–15)
BUN: 21 mg/dL (ref 8–23)
CO2: 26 mmol/L (ref 22–32)
Calcium: 9.4 mg/dL (ref 8.9–10.3)
Chloride: 107 mmol/L (ref 98–111)
Creatinine, Ser: 0.68 mg/dL (ref 0.44–1.00)
GFR, Estimated: >60 mL/min (ref >60)
Glucose, Bld: 121 mg/dL — ABNORMAL HIGH (ref 70–99)
Potassium: 3.3 mmol/L — ABNORMAL LOW (ref 3.5–5.1)
Sodium: 141 mmol/L (ref 135–145)
Total Bilirubin: 0.7 mg/dL (ref 0.3–1.2)
Total Protein: 6.8 g/dL (ref 6.5–8.1)

## 2023-01-14 LAB — URINALYSIS, ROUTINE W REFLEX MICROSCOPIC
Bacteria, UA: NONE SEEN
Bilirubin Urine: NEGATIVE
Glucose, UA: NEGATIVE mg/dL
Ketones, ur: NEGATIVE mg/dL
Leukocytes,Ua: NEGATIVE
Nitrite: NEGATIVE
Protein, ur: NEGATIVE mg/dL
Specific Gravity, Urine: 1.039 — ABNORMAL HIGH (ref 1.005–1.030)
pH: 6 (ref 5.0–8.0)

## 2023-01-14 LAB — LIPASE, BLOOD: Lipase: 44 U/L (ref 11–51)

## 2023-01-14 MED ORDER — IOHEXOL 350 MG/ML SOLN
100.0000 mL | Freq: Once | INTRAVENOUS | Status: AC | PRN
  Administered 2023-01-14: 100 mL via INTRAVENOUS

## 2023-01-14 MED ORDER — HYDROCODONE-ACETAMINOPHEN 5-325 MG PO TABS: 1.0000 | ORAL_TABLET | Freq: Four times a day (QID) | ORAL | 0 refills | Status: DC | PRN

## 2023-01-14 MED ORDER — MORPHINE SULFATE (PF) 4 MG/ML IV SOLN
4.0000 mg | Freq: Once | INTRAVENOUS | Status: AC
  Administered 2023-01-14: 4 mg via INTRAVENOUS
  Filled 2023-01-14: qty 1

## 2023-01-14 MED ORDER — HYDROCODONE-ACETAMINOPHEN 5-325 MG PO TABS
1.0000 | ORAL_TABLET | Freq: Once | ORAL | Status: AC
  Administered 2023-01-14: 1 via ORAL
  Filled 2023-01-14: qty 1

## 2023-01-14 NOTE — ED Triage Notes (Addendum)
Pt arrived via ems from home due to lower back pain/spasms. Pt states pain radiates to her abdomen. X 3-4 days. Pt denies recent injury / falls. Pt states hx of kidney stones. Pt is A&Ox4. Pt states stage 4 lung cancer, currently receiving chemo.

## 2023-01-14 NOTE — Discharge Instructions (Addendum)
No driving this evening or while taking hydrocodone.  Please follow-up tomorrow with Dr. Rogue Bussing as you have already planned, please notify him of your ER visit tonight

## 2023-01-14 NOTE — ED Provider Notes (Signed)
[facility] Provider Note    Event Date/Time   First MD Initiated Contact with Patient 01/14/23 1644     (approximate)   History   Back Pain   HPI  Laura Nielsen is a 67 y.o. female who on review of notes by oncology from January of this year has recurrent metastatic disease.  Patient reports she is currently being treated for primary lung cancer with metastatic disease.  She has and suffers from chronic lower back pain.  She reports that she started having a pain in her lower back roughly a week ago, but she used the bathroom today and seem to become severe.  She reports waves of pain and "spasm" along her right lateral mid to lower back.  No numbness or weakness.  No changes in bowel or bladder habits.  No nausea or vomiting or fevers.  No pain or burning with urination  Currently reports a very severe pain in her mid to right lower back pointing towards her upper lumbar region.  There is some pain in the center but mostly it is off to the right and she reports it feels like a tightening and severe pain in the muscle     Physical Exam   Triage Vital Signs: ED Triage Vitals  Enc Vitals Group     BP 01/14/23 1641 (!) 155/68     Pulse Rate 01/14/23 1638 72     Resp 01/14/23 1638 19     Temp 01/14/23 1639 98 F (36.7 C)     Temp Source 01/14/23 1639 Oral     SpO2 01/14/23 1638 99 %     Weight --      Height --      Head Circumference --      Peak Flow --      Pain Score 01/14/23 1638 9     Pain Loc --      Pain Edu? --      Excl. in [place]? --     Most recent vital signs: Vitals:   01/14/23 1641 01/14/23 1830  BP: (!) 155/68 (!) 130/98  Pulse:  61  Resp:  14  Temp:    SpO2:  98%     General: Awake, no distress.  CV:  Good peripheral perfusion.  Palpable dorsalis pedis pulses bilateral Resp:  Normal effort.  Clear bilateral Abd:  No distention.  Soft nontender nondistended throughout.  No costovertebral angle tenderness to percussion.   Palpation along the right mid lumbar region demonstrates pain that is reproducible, also reports moderate pain in the midline lumbar spine.ther:  5 out of 5 strength with dorsi and plantarflexion as well as use of the hips and knees bilateral.  No paresthesias or loss of sensation in lower extremities bilateral.  No saddle anesthesia   ED Results / Procedures / Treatments   Labs (all labs ordered are listed, but only abnormal results are displayed) Labs Reviewed  CBC - Abnormal; Notable for the following components:      Result Value   RBC 5.22 (*)    All other components within normal limits  COMPREHENSIVE METABOLIC PANEL - Abnormal; Notable for the following components:   Potassium 3.3 (*)    Glucose, Bld 121 (*)    All other components within normal limits  URINALYSIS, ROUTINE W REFLEX MICROSCOPIC - Abnormal; Notable for the following components:   Color, Urine YELLOW (*)    APPearance CLEAR (*)    Specific Gravity, Urine 1.039 (*)  Hgb urine dipstick SMALL (*)    All other components within normal limits  LIPASE, BLOOD     EKG  Interpreted by me at 1640 heart rate 70 QRS 90 QTc 450 Normal sinus rhythm.  Slight artifact.  No evidence of acute ischemia.  Probable old anteroseptal infarct   RADIOLOGY  US PELVIC COMPLETE W TRANSVAGINAL AND TORSION R/O  Result Date: 01/14/2023 CLINICAL DATA:  Pelvic pain EXAM: TRANSABDOMINAL AND TRANSVAGINAL ULTRASOUND OF PELVIS DOPPLER ULTRASOUND OF OVARIES TECHNIQUE: Both transabdominal and transvaginal ultrasound examinations of the pelvis were performed. Transabdominal technique was performed for global imaging of the pelvis including uterus, ovaries, adnexal regions, and pelvic cul-de-sac. It was necessary to proceed with endovaginal exam following the transabdominal exam to visualize the right ovary. Color and duplex Doppler ultrasound was utilized to evaluate blood flow to the ovaries. COMPARISON:  CT 01/14/2023, ultrasound 09/08/2020  FINDINGS: Uterus Absent. Endometrium Not applicable Right ovary Measurements: 9.5 x 7.4 x 8.0 cm = volume: 297 mL. The right ovary is replaced by a multilocular cystic lesion demonstrating multiple slightly irregular, slightly thickened internal septa but no definite solid component or internal vascularity. The lesion has increased in size since remote prior examination where this measured 6.9 cm in greatest dimension. Left ovary Not visualized.  No adnexal mass. Pulsed Doppler evaluation of the visualized right ovary demonstrates preserved peripheral arterial and venous vascularity. Other findings Multiple nonspecific hyperechoic nodules are seen within the vaginal cuff which may represent lesion such as epidermoid cysts or postsurgical change, measuring up to 8 mm in greatest dimension. IMPRESSION: 1. Interval increase in size of the multilocular cystic lesion within the right ovary, now measuring up to 9.5 cm in greatest dimension demonstrating irregular internal septa, but no solid component or significant internal vascularity. This is best characterized as an oval rads 4 lesion. Gynecologic oncologic consultation is recommended for further management. Contrast enhanced MRI examination may also be helpful for further evaluation. 2. Status post hysterectomy. 3. Nonvisualization of the left ovary. Electronically Signed   By: Fidela Salisbury M.D.   On: 01/14/2023 20:12   CT L-SPINE NO CHARGE  Result Date: 01/14/2023 CLINICAL DATA:  Back pain. Concern for aortic aneurysm. History of non-small cell lung cancer. EXAM: CT THORACIC AND LUMBAR SPINE WITHOUT CONTRAST TECHNIQUE: Multidetector CT imaging of the thoracic and lumbar spine was performed without contrast. Multiplanar CT image reconstructions were also generated. RADIATION DOSE REDUCTION: This exam was performed according to the departmental dose-optimization program which includes automated exposure control, adjustment of the mA and/or kV according to  patient size and/or use of iterative reconstruction technique. COMPARISON:  CT of the chest abdomen pelvis dated 09/26/2022. FINDINGS: CT THORACIC SPINE FINDINGS Alignment: No acute subluxation. Vertebrae: No acute fracture.  Osteopenia. Paraspinal and other soft tissues: Negative. Disc levels: Multilevel degenerative changes with disc space narrowing, endplate irregularity, spurring/osteophyte. There is multilevel sclerotic changes. CT LUMBAR SPINE FINDINGS Segmentation: 5 lumbar type vertebrae. Alignment: No acute subluxation. Vertebrae: Osteopenia.  No acute fracture. Paraspinal and other soft tissues: Negative. Disc levels: Multilevel degenerative changes with disc space narrowing, endplate irregularity, spurring. Multilevel disc desiccation and vacuum phenomena. IMPRESSION: 1. No acute fracture or subluxation of the thoracic or lumbar spine. 2. Multilevel degenerative changes. Electronically Signed   By: Anner Crete M.D.   On: 01/14/2023 18:53   CT T-SPINE NO CHARGE  Result Date: 01/14/2023 CLINICAL DATA:  Back pain. Concern for aortic aneurysm. History of non-small cell lung cancer. EXAM: CT THORACIC AND LUMBAR SPINE  WITHOUT CONTRAST TECHNIQUE: Multidetector CT imaging of the thoracic and lumbar spine was performed without contrast. Multiplanar CT image reconstructions were also generated. RADIATION DOSE REDUCTION: This exam was performed according to the departmental dose-optimization program which includes automated exposure control, adjustment of the mA and/or kV according to patient size and/or use of iterative reconstruction technique. COMPARISON:  CT of the chest abdomen pelvis dated 09/26/2022. FINDINGS: CT THORACIC SPINE FINDINGS Alignment: No acute subluxation. Vertebrae: No acute fracture.  Osteopenia. Paraspinal and other soft tissues: Negative. Disc levels: Multilevel degenerative changes with disc space narrowing, endplate irregularity, spurring/osteophyte. There is multilevel sclerotic  changes. CT LUMBAR SPINE FINDINGS Segmentation: 5 lumbar type vertebrae. Alignment: No acute subluxation. Vertebrae: Osteopenia.  No acute fracture. Paraspinal and other soft tissues: Negative. Disc levels: Multilevel degenerative changes with disc space narrowing, endplate irregularity, spurring. Multilevel disc desiccation and vacuum phenomena. IMPRESSION: 1. No acute fracture or subluxation of the thoracic or lumbar spine. 2. Multilevel degenerative changes. Electronically Signed   By: Anner Crete M.D.   On: 01/14/2023 18:53   CT Angio Chest/Abd/Pel for Dissection W and/or Wo Contrast  Result Date: 01/14/2023 CLINICAL DATA:  Concern for aortic aneurysm. Back pain. Non-small cell lung cancer. EXAM: CT ANGIOGRAPHY CHEST, ABDOMEN AND PELVIS TECHNIQUE: Non-contrast CT of the chest was initially obtained. Multidetector CT imaging through the chest, abdomen and pelvis was performed using the standard protocol during bolus administration of intravenous contrast. Multiplanar reconstructed images and MIPs were obtained and reviewed to evaluate the vascular anatomy. RADIATION DOSE REDUCTION: This exam was performed according to the departmental dose-optimization program which includes automated exposure control, adjustment of the mA and/or kV according to patient size and/or use of iterative reconstruction technique. CONTRAST:  167mL OMNIPAQUE IOHEXOL 350 MG/ML SOLN COMPARISON:  CT dated 12/20/2022. FINDINGS: Evaluation of this exam is limited due to respiratory motion artifact. CTA CHEST FINDINGS Cardiovascular: There is no cardiomegaly or pericardial effusion. There is coronary vascular calcification. Moderate calcified and noncalcified plaque of the thoracic aorta. No aneurysmal dilatation or dissection. The origins of the great vessels of the aortic arch appear patent. No pulmonary artery embolus identified. Mediastinum/Nodes: There is no hilar or mediastinal adenopathy. The esophagus is grossly unremarkable.  No mediastinal fluid collection. Lungs/Pleura: Faint scattered nodular ground-glass densities may be artifactual and related to motion. Atypical pneumonia is not excluded clinical correlation recommended. Similar appearance of several ground-glass nodular density in the posterior right lower lobe dating back to 2020, likely scarring. Attention on follow-up imaging recommended. No consolidative changes. There is no pleural effusion pneumothorax. The central airways are patent. Musculoskeletal: Right-sided Port-A-Cath with tip close to the cavoatrial junction. Degenerative changes of the spine. No acute osseous pathology. Review of the MIP images confirms the above findings. CTA ABDOMEN AND PELVIS FINDINGS VASCULAR Aorta: Advanced atherosclerotic calcification. An infrarenal aorto bi iliac endovascular stent graft noted. The stent appears patent. No aneurysmal dilatation or dissection. No periaortic fluid collection. Celiac: Celiac trunk and its major branches appear patent. SMA: The SMA is patent. Renals: There is atherosclerotic calcification of the renal arteries. The renal arteries patent. IMA: There is poor opacification of the IMA. Inflow: Mild atherosclerotic calcification iliac arteries. The iliac arteries remain patent. No aneurysmal dilatation or dissection. Veins: No obvious venous abnormality within the limitations of this arterial phase study. Review of the MIP images confirms the above findings. NON-VASCULAR No intra-abdominal free air or free fluid. Hepatobiliary: No focal liver abnormality is seen. No gallstones, gallbladder wall thickening, or biliary dilatation. Pancreas: Unremarkable.  No pancreatic ductal dilatation or surrounding inflammatory changes. Spleen: Normal in size without focal abnormality. Adrenals/Urinary Tract: The adrenal glands unremarkable. There is a 2 cm right renal inferior pole cyst. There is no hydronephrosis on either side. The visualized ureters and urinary bladder appear  unremarkable. Stomach/Bowel: There is sigmoid diverticulosis without active inflammatory changes. There is no bowel obstruction or active inflammation The appendix is not visualized with certainty. No inflammatory changes identified in the right lower quadrant. Lymphatic: No adenopathy. Reproductive: Hysterectomy. The left ovary is unremarkable. A septated right ovarian cyst measuring up to 9.3 cm, increased since the prior CT. No metabolic activity was demonstrated on PET-CT of 05/23/2022. Because this lesion is not adequately characterized, prompt [place] is recommended for further evaluation. Note: This recommendation does not apply to premenarchal patients and to those with increased risk (genetic, family history, elevated tumor markers or other high-risk factors) of ovarian cancer. Reference: JACR 2020 Feb; 17(2):248-254 Other: None Musculoskeletal: Osteopenia with extensive degenerative changes of the spine. No acute osseous pathology. Review of the MIP images confirms the above findings. IMPRESSION: 1. No acute intrathoracic, abdominal, or pelvic pathology. No aortic aneurysm or dissection. 2. No evidence of metastatic disease in the chest, abdomen, or pelvis. 3. Several ground-glass/part solid nodular density at the right lung base similar prior CT. No associated metabolic activity on the PET CT. Attention on follow-up imaging recommended. 4. Sigmoid diverticulosis without active inflammatory changes. No bowel obstruction. 5. Enlarging septated right ovarian cyst. No metabolic activity was demonstrated on PET-CT of 05/23/2022. Further evaluation with ultrasound is recommended. Electronically Signed   By: Anner Crete M.D.   On: 01/14/2023 18:47      PROCEDURES:  Critical Care performed: No  Procedures   MEDICATIONS ORDERED IN ED: Medications  morphine (PF) 4 MG/ML injection 4 mg (4 mg Intravenous Given 01/14/23 1654)  HYDROcodone-acetaminophen (NORCO/VICODIN) 5-325 MG per tablet 1 tablet (1  tablet Oral Given 01/14/23 1654)  iohexol (OMNIPAQUE) 350 MG/ML injection 100 mL (100 mLs Intravenous Contrast Given 01/14/23 1741)     IMPRESSION / MDM / ASSESSMENT AND PLAN / ED COURSE  I reviewed the triage vital signs and the nursing notes.                              Differential diagnosis includes, but is not limited to, possible compression fracture, metastatic disease, new spinal injury fracture muscle spasms exacerbation of chronic pain etc.  Seems to be likely musculoskeletal in nature but given her underlying history of cancer certainly metastatic causation's compression fractures etc. are high.  She denies any trauma or injury.  Neurovascular intact lower extremities bilateral which is reassuring.  She denies acute abdominal pain although I think referred pain from flank or renal causation should also be evaluated but seems unlikely.  Given the nature of the pain position in her back and presentation we will proceed with imaging also exclude aortic dissection as a cause of potential referred pain to the back.  Patient reports she is no longer followed by the pain clinic but has taken hydrocodone in the past  Patient's presentation is most consistent with acute complicated illness / injury requiring diagnostic workup.   The patient is on the cardiac monitor to evaluate for evidence of arrhythmia and/or significant heart rate changes.  Clinical Course as of 01/14/23 2127  Nancy Fetter Jan 14, 2023  2118 Pulsed Doppler evaluation of the visualized right ovary demonstrates  preserved peripheral  arterial and venous vascularity.   Other findings   Multiple nonspecific hyperechoic nodules are seen within the vaginal  cuff which may represent lesion such as epidermoid cysts or  postsurgical change, measuring up to 8 mm in greatest dimension.   IMPRESSION:  1. Interval increase in size of the multilocular cystic lesion  within the right ovary, now measuring up to 9.5 cm in greatest   dimension demonstrating irregular internal septa, but no solid  component or significant internal vascularity. This is best  characterized as an oval rads 4 lesion. Gynecologic oncologic  consultation is recommended for further management. Contrast  enhanced MRI examination may also be helpful for further evaluation.  2. Status post hysterectomy.  3. Nonvisualization of the left ovary.    [MQ]    Clinical Course User Index [MQ] Delman Kitten, MD   ----------------------------------------- 9:27 PM on 01/14/2023 ----------------------------------------- Patient pain controlled at this time.  I have contacted and Dr. Rogue Bussing aware of the patient's ER visit, and patient does have a follow-up appointment tomorrow that she will see him.  Patient comfortable with plan for discharge.  Has used hydrocodone for back pain in the past.  She is not driving tonight or will not drive within 8 hours of use of hydrocodone.  She will be following up closely with oncology tomorrow.  She is alert oriented and appropriate for discharge at this time  Lumbar strain or exacerbation of chronic lumbar pain.  No evidence of acute intra-abdominal etiology.  Patient well aware of her ovarian tumor already, and following with oncology for it.  No evidence of vascular compromise on ultrasound  Return precautions and treatment recommendations and follow-up discussed with the patient who is agreeable with the plan.   FINAL CLINICAL IMPRESSION(S) / ED DIAGNOSES   Final diagnoses:  Strain of lumbar region, initial encounter  Ovarian mass, right     Rx / DC Orders   ED Discharge Orders          Ordered    HYDROcodone-acetaminophen (NORCO/VICODIN) 5-325 MG tablet  Every 6 hours PRN        01/14/23 2125             Note:  This document was prepared using Dragon voice recognition software and may include unintentional dictation errors.   Delman Kitten, MD 01/14/23 2129

## 2023-01-15 ENCOUNTER — Ambulatory Visit
Admission: RE | Admit: 2023-01-15 | Discharge: 2023-01-15 | Disposition: A | Discharge status: Home / Self Care | Payer: Medicare Other | Source: Ambulatory Visit | Attending: Internal Medicine | Admitting: Internal Medicine

## 2023-01-15 ENCOUNTER — Inpatient Hospital Stay: Payer: Medicare Other

## 2023-01-15 ENCOUNTER — Telehealth: Payer: Self-pay | Admitting: *Deleted

## 2023-01-15 ENCOUNTER — Inpatient Hospital Stay: Payer: Medicare Other | Attending: Internal Medicine | Admitting: Internal Medicine

## 2023-01-15 ENCOUNTER — Encounter: Payer: Self-pay | Admitting: Internal Medicine

## 2023-01-15 VITALS — BP 106/82 | HR 65 | Temp 98.7°F | Resp 18 | Wt 218.0 lb

## 2023-01-15 DIAGNOSIS — C3432 Malignant neoplasm of lower lobe, left bronchus or lung: Secondary | ICD-10-CM

## 2023-01-15 DIAGNOSIS — C7972 Secondary malignant neoplasm of left adrenal gland: Secondary | ICD-10-CM | POA: Diagnosis not present

## 2023-01-15 DIAGNOSIS — Z5112 Encounter for antineoplastic immunotherapy: Secondary | ICD-10-CM | POA: Diagnosis not present

## 2023-01-15 DIAGNOSIS — M4126 Other idiopathic scoliosis, lumbar region: Secondary | ICD-10-CM | POA: Diagnosis not present

## 2023-01-15 DIAGNOSIS — Z79899 Other long term (current) drug therapy: Secondary | ICD-10-CM | POA: Insufficient documentation

## 2023-01-15 DIAGNOSIS — M48061 Spinal stenosis, lumbar region without neurogenic claudication: Secondary | ICD-10-CM | POA: Diagnosis not present

## 2023-01-15 LAB — COMPREHENSIVE METABOLIC PANEL WITH GFR
ALT: 16 U/L (ref 0–44)
AST: 20 U/L (ref 15–41)
Albumin: 3.8 g/dL (ref 3.5–5.0)
Alkaline Phosphatase: 83 U/L (ref 38–126)
Anion gap: 9 (ref 5–15)
BUN: 19 mg/dL (ref 8–23)
CO2: 26 mmol/L (ref 22–32)
Calcium: 9.2 mg/dL (ref 8.9–10.3)
Chloride: 103 mmol/L (ref 98–111)
Creatinine, Ser: 0.64 mg/dL (ref 0.44–1.00)
GFR, Estimated: >60 mL/min
Glucose, Bld: 99 mg/dL (ref 70–99)
Potassium: 3.4 mmol/L — ABNORMAL LOW (ref 3.5–5.1)
Sodium: 138 mmol/L (ref 135–145)
Total Bilirubin: 0.6 mg/dL (ref 0.3–1.2)
Total Protein: 7 g/dL (ref 6.5–8.1)

## 2023-01-15 LAB — CBC WITH DIFFERENTIAL/PLATELET
Abs Immature Granulocytes: 0.03 10*3/uL (ref 0.00–0.07)
Basophils Absolute: 0 10*3/uL (ref 0.0–0.1)
Basophils Relative: 1 %
Eosinophils Absolute: 0.5 10*3/uL (ref 0.0–0.5)
Eosinophils Relative: 6 %
HCT: 43.7 % (ref 36.0–46.0)
Hemoglobin: 14.7 g/dL (ref 12.0–15.0)
Immature Granulocytes: 0 %
Lymphocytes Relative: 33 %
Lymphs Abs: 2.7 10*3/uL (ref 0.7–4.0)
MCH: 28.7 pg (ref 26.0–34.0)
MCHC: 33.6 g/dL (ref 30.0–36.0)
MCV: 85.2 fL (ref 80.0–100.0)
Monocytes Absolute: 0.5 10*3/uL (ref 0.1–1.0)
Monocytes Relative: 6 %
Neutro Abs: 4.5 10*3/uL (ref 1.7–7.7)
Neutrophils Relative %: 54 %
Platelets: 185 10*3/uL (ref 150–400)
RBC: 5.13 MIL/uL — ABNORMAL HIGH (ref 3.87–5.11)
RDW: 13.7 % (ref 11.5–15.5)
WBC: 8.2 10*3/uL (ref 4.0–10.5)
nRBC: 0 % (ref 0.0–0.2)

## 2023-01-15 MED ORDER — SODIUM CHLORIDE 0.9 % IV SOLN
200.0000 mg | Freq: Once | INTRAVENOUS | Status: AC
  Administered 2023-01-15: 200 mg via INTRAVENOUS
  Filled 2023-01-15: qty 8

## 2023-01-15 MED ORDER — SODIUM CHLORIDE 0.9 % IV SOLN
Freq: Once | INTRAVENOUS | Status: AC
  Filled 2023-01-15: qty 250

## 2023-01-15 MED ORDER — DIAZEPAM 5 MG PO TABS: 5.0000 mg | ORAL_TABLET | Freq: Two times a day (BID) | ORAL | 0 refills | Status: DC | PRN

## 2023-01-15 MED ORDER — GADOBUTROL 1 MMOL/ML IV SOLN
10.0000 mL | Freq: Once | INTRAVENOUS | Status: AC | PRN
  Administered 2023-01-15: 10 mL via INTRAVENOUS

## 2023-01-15 MED ORDER — SODIUM CHLORIDE 0.9% FLUSH
10.0000 mL | INTRAVENOUS | Status: DC | PRN
  Filled 2023-01-15: qty 10

## 2023-01-15 MED ORDER — HEPARIN SOD (PORK) LOCK FLUSH 100 UNIT/ML IV SOLN
500.0000 [IU] | Freq: Once | INTRAVENOUS | Status: AC | PRN
  Administered 2023-01-15: 500 [IU]
  Filled 2023-01-15: qty 5

## 2023-01-15 NOTE — Progress Notes (Signed)
Pt went to ED yest evening for severe Back pain and spasms. They sent in prescription for hydrocodone to her pharmacy. Her son will be picking it up today.  Her back is still hurting and spasms still present. Dyspnea with exertion. Has chronic cough. Clear phlegm. Appetite is up and down. Has a lot of fatigue.

## 2023-01-15 NOTE — Assessment & Plan Note (Addendum)
#  JUNE 2023-STAGE IV--recurrent/metastatic disease with New left adrenal metastasis;  PET scan JUNE 20th- 2023-  Enlarged 4 cm hypermetabolic LEFT adrenal metastasis;  Suspicion of metastatic adenopathy to the LEFT hilum.  Part solid nodule in the RIGHT lower lobe without metabolic activity. 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 Currently on single agent Keytruda [PD-L1 greater than 80% ].  Currently on single agent Keytruda.    CT FEB 11th, 2024 [ER]- CT CAP- No acute intrathoracic, abdominal, or pelvic pathology. No aortic aneurysm or dissection;  No evidence of metastatic disease in the chest, abdomen, or pelvis. Several ground-glass/part solid nodular density at the right lung base similar prior CT. see discussion below regarding right ovarian mass.  # Proceed of Keytruda single agent. Labs today reviewed;  acceptable for treatment today. Tolerating well except for mild chills.   # lumbar pain/right x2 weeks; worsening- ? Spasming [Hx of kidney stones]- ? [facility] vs others- refer to orthopedic. Also MRI lumbar spine STAT; add valium bid prn; and continue hydrocodone prn/ER.    # Chronic headaches/cervical pain/ visual blurriness- not new. On NSAIDs [; monitor for now. OCT 2023- MRI brain- NEGATIVE for any metastatic disease.  Declined referral to neurology/given previous extensive workup.  Stable.   # Left rib pain-Post throacotomy pain/tingling and numbness:  Continue gabapentin -300 mg TID; and then at extra at night. Stable.   # CAD/ PVD- cramping- s/p evaluation [Dr.Schneir] dec 2023 stable.   # COPD-stable encouraged continue to avoid smoking; again counseled to quit smoking; recommend evaluation with pulmonary. Stable.   #Right mildly complex cystic adnexal mass noted- s/p prior pelvic ultrasound for further work-up [s/p Gyn-Onc; Dr.Berchuck; felt high risk for surgery] 8.1 x 5.8  cm increased from 6.3 by 4.3 cm. Enlarging septated right ovarian cyst. No metabolic activity was demonstrated on PET-CT of 05/23/2022.  Pelvic ultrasound showed increasing size of the ovarian lesion.  Recommend further evaluation with gynecology oncology at next visit-given acute issue of the right hip pain.  # IV Access :s/p  port placement- stable.   # Vaccinations: s/p Pneumonia shot [2022] flu shot/covid- declines  *AM appts- Monday-   # DISPOSITION: # MRI Lumbar spine- STAT # refer to [facility] re: right hip pain ASAP # [name] today #Follow-up in 3 weeks-MD labs/port- cbc/cmp; Keytruda; Dr. B  # I reviewed the blood work- with the patient in detail; also reviewed the imaging independently [as summarized above]; and with the patient in detail.

## 2023-01-15 NOTE — Progress Notes (Signed)
[place] [place] NOTE  Patient Care Team: Birdie Sons, MD as PCP - General (Family Medicine) Ubaldo Glassing Javier Docker, MD as Consulting Physician (Cardiology) Schnier, Dolores Lory, MD (Vascular Surgery) Vladimir Crofts, MD as Consulting Physician (Neurology) Milinda Pointer, MD as Referring Physician (Pain Medicine) Pa, [place] (Optometry) Ubaldo Glassing, Javier Docker, MD as Consulting Physician (Cardiology) Virgel Manifold, MD (Inactive) as Consulting Physician (Gastroenterology) Gae Dry, MD as Referring Physician (Obstetrics and Gynecology) Telford Nab, RN as Oncology Nurse Navigator Cammie Sickle, MD as Consulting Physician (Oncology)   CHIEF COMPLAINTS/PURPOSE OF CONSULTATION: lung cancer  #  Oncology History Overview Note  #MAY 2022- LLL nodule 2.3 cm Adenocarcinoma Stage IA; s/p lobectomy.  No adjuvant therapy  # CT scan 4th June 2023-highly concerning for recurrent/metastatic disease with-. New left adrenal metastasis;  Ground-glass and part solid nodules in the peripheral right lower lobe, unchanged from 11/07/2021 but slightly enlarged from 01/01/2018. Findings are suspicious for indolent adenocarcinoma.  F One- PFL1 =80%; KRAS G12C PET scan JUNE 20th- 2023-  Enlarged 4 cm hypermetabolic LEFT adrenal metastasis;  Suspicion of metastatic adenopathy to the LEFT hilum.  Part solid nodule in the RIGHT lower lobe without metabolic activity.   3 ADRENAL BIOPSY- CK7 POSITIVE ADENOCARCINOMA- There is limited tissue remaining for ancillary testing, not likely  sufficient for NGS testing.   # AUG 7th, 2023-single agent Keytruda.    Primary cancer of left lower lobe of lung ([place])  05/09/2021 Initial Diagnosis   Primary cancer of left lower lobe of lung ([place])   07/04/2022 -  Chemotherapy   Patient is on Treatment Plan : LUNG NSCLC Pembrolizumab (200) q21d     07/10/2022 - 07/10/2022 Chemotherapy   Patient is on Treatment Plan : LUNG NSCLC Pembrolizumab  (200) q21d        HISTORY OF PRESENTING ILLNESS: Patient in a wheelchair.  Accompanied by son.  Israel Wunder 67 y.o.  female history of smoking; prior history of lung cancer-with likely recurrence to left adrenal gland [s/p Bx CK-7 positive TTF-1 negative]-clinically suggestive of recurrent lung cancer on single agent [name] is here for follow-up.  In the interim patient was seen in the emergency room for worsening right lumbar pain.  Radiating to the right.  CT spine imaging showed no progressive disease.  Discharged in emergency room with hydrocodone.  Patient has not started to take on hydrocodone yet.  Many years ago noted to have severe arthritis of the back needing surgery.  Denies any diarrhea.  Denies any abdominal pain. Otherwise patient continues to have mild shortness of breath on exertion.  Denies any headaches.  Denies any nausea vomiting. Complains of cramping in the legs chronic.   Review of Systems  Constitutional:  Negative for chills, diaphoresis, fever, malaise/fatigue and weight loss.  HENT:  Negative for nosebleeds and sore throat.   Eyes:  Negative for double vision.  Respiratory:  Negative for cough, hemoptysis, sputum production, shortness of breath and wheezing.   Cardiovascular:  Positive for chest pain. Negative for palpitations, orthopnea and leg swelling.  Gastrointestinal:  Negative for abdominal pain, blood in stool, constipation, diarrhea, heartburn, melena, nausea and vomiting.  Genitourinary:  Negative for dysuria, frequency and urgency.  Musculoskeletal:  Positive for back pain and joint pain.  Skin: Negative.  Negative for itching and rash.  Neurological:  Positive for tingling. Negative for dizziness, focal weakness, weakness and headaches.  Endo/Heme/Allergies:  Does not bruise/bleed easily.  Psychiatric/Behavioral:  Negative for depression.  The patient is not nervous/anxious and does not have insomnia.      MEDICAL HISTORY:  Past Medical History:   Diagnosis Date   AAA (abdominal aortic aneurysm) ([place])    s/p EVAR AAA 03/27/13   Arthritis    Asthma    Cancer (HCC)    Complication of anesthesia    BP drops after   History of kidney stones    Osteoporosis    Sleep apnea     SURGICAL HISTORY: Past Surgical History:  Procedure Laterality Date   ABDOMINAL AORTIC ENDOVASCULAR STENT GRAFT  03/27/2013   Dr. Hortencia Pilar   Cardiac catheterization  02/2009   70-80% stenosis RCA stent placed. started on Plavix   CARDIAC CATHETERIZATION     COLONOSCOPY WITH PROPOFOL N/A 01/05/2021   Procedure: COLONOSCOPY WITH PROPOFOL;  Surgeon: Virgel Manifold, MD;  Location: [facility] ENDOSCOPY;  Service: Endoscopy;  Laterality: N/A;   CORONARY ANGIOPLASTY     stent placed   ESOPHAGOGASTRODUODENOSCOPY (EGD) WITH PROPOFOL N/A 01/05/2021   Procedure: ESOPHAGOGASTRODUODENOSCOPY (EGD) WITH PROPOFOL;  Surgeon: Virgel Manifold, MD;  Location: [facility] ENDOSCOPY;  Service: Endoscopy;  Laterality: N/A;   INTERCOSTAL NERVE BLOCK Left 04/06/2021   Procedure: INTERCOSTAL NERVE BLOCK;  Surgeon: Melrose Nakayama, MD;  Location: [facility];  Service: Thoracic;  Laterality: Left;   IR IMAGING GUIDED PORT INSERTION  06/20/2022   KIDNEY STONE SURGERY  1999   LUNG LOBECTOMY Left 04/06/2021   robotic left lower lobectomy 04/06/2021 Dr. Roxan Hockey for Stage 1A adenocarcinoma   NODE DISSECTION Left 04/06/2021   Procedure: NODE DISSECTION;  Surgeon: Melrose Nakayama, MD;  Location: [facility];  Service: Thoracic;  Laterality: Left;   TUBAL LIGATION     VAGINAL HYSTERECTOMY     Menometrorrhagia. Excessive bleeding. Unknown if cervix removed.     SOCIAL HISTORY: Social History   Socioeconomic History   Marital status: Divorced    Spouse name: Not on file   Number of children: 2   Years of education: H/S   Highest education level: High school graduate  Occupational History   Occupation: Disabled   Occupation: retired  Tobacco Use   Smoking status: Former     Packs/day: 0.25    Years: 44.50    Total pack years: 11.13    Types: Cigarettes    Quit date: 04/05/2021    Years since quitting: 1.7   Smokeless tobacco: Never   Tobacco comments:    12/23/19 states she quit for 3 months and then started back. Pt is going to talk with MD about this.  Vaping Use   Vaping Use: Never used  Substance and Sexual Activity   Alcohol use: Yes    Comment: rarely - once a year   Drug use: No   Sexual activity: Not on file  Other Topics Concern   Not on file  Social History Narrative   Hx of smoking; quit prior to lung surgery. Lives in [place]- self. No alcohol. 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 On disability sec to spinal pain.    Social Determinants of Health   Financial Resource Strain: Low Risk  (02/10/2021)   Overall Financial Resource Strain (CARDIA)    Difficulty of Paying Living Expenses: Not hard at all  Food Insecurity: No Food Insecurity (03/21/2022)   Hunger Vital Sign    Worried About Running Out of Food in the Last Year: Never true    Ran Out of Food in the Last Year: Never true  Transportation  Needs: No Transportation Needs (03/21/2022)   PRAPARE - Hydrologist (Medical): No    Lack of Transportation (Non-Medical): No  Physical Activity: Inactive (02/10/2021)   Exercise Vital Sign    Days of Exercise per Week: 0 days    Minutes of Exercise per Session: 0 min  Stress: Stress Concern Present (02/10/2021)   [place]    Feeling of Stress : To some extent  Social Connections: Socially Isolated (02/10/2021)   Social Connection and Isolation Panel [NHANES]    Frequency of Communication with Friends and Family: More than three times a week    Frequency of Social Gatherings with Friends and Family: Once a week    Attends Religious Services: Never    Marine scientist or Organizations: No    Attends Archivist Meetings: Never     Marital Status: Divorced  Human resources officer Violence: Not At Risk (02/10/2021)   Humiliation, Afraid, Rape, and Kick questionnaire    Fear of Current or Ex-Partner: No    Emotionally Abused: No    Physically Abused: No    Sexually Abused: No    FAMILY HISTORY: Family History  Problem Relation Age of Onset   Hypertension Mother    Coronary artery disease Mother    Heart attack Mother        acute   Cancer Mother    Alcohol abuse Father    Depression Father    Hypertension Father    Heart attack Father 28       acute   Alcohol abuse Sister    Hyperlipidemia Sister    Hypertension Sister    Cancer Sister 11   Heart attack Sister        x's 2   Coronary artery disease Sister 46       x's 2   Breast cancer Sister 32    ALLERGIES:  is allergic to atorvastatin, omeprazole, and bupropion.  MEDICATIONS:  Current Outpatient Medications  Medication Sig Dispense Refill   allopurinol (ZYLOPRIM) 100 MG tablet Take 1 tablet (100 mg total) by mouth daily. 90 tablet 3   aspirin 81 MG tablet Take 81 mg by mouth daily.      bisoprolol (ZEBETA) 10 MG tablet Take 1 tablet (10 mg total) by mouth daily. 90 tablet 4   clopidogrel (PLAVIX) 75 MG tablet Take 1 tablet (75 mg total) by mouth daily. 90 tablet 4   diazepam (VALIUM) 5 MG tablet Take 1 tablet (5 mg total) by mouth every 12 (twelve) hours as needed. 30 tablet 0   gabapentin (NEURONTIN) 300 MG capsule Take 1 capsule (300 mg total) by mouth 3 (three) times daily AND 2 capsules (600 mg total) at bedtime.     HYDROcodone-acetaminophen (NORCO/VICODIN) 5-325 MG tablet Take 1-2 tablets by mouth every 6 (six) hours as needed for moderate pain. 20 tablet 0   ibuprofen (ADVIL) 200 MG tablet Take 400 mg by mouth 2 (two) times daily as needed (headaches).     lidocaine-prilocaine (EMLA) cream Apply on the port. 30 -45 min  prior to port access. 30 g 3   lisinopril-hydrochlorothiazide (ZESTORETIC) 20-12.5 MG tablet Take 1 tablet by mouth daily. 90  tablet 0   OZEMPIC, 1 MG/DOSE, 4 MG/3ML SOPN Inject 1 mg into the skin once a week. 3 mL 4   pantoprazole (PROTONIX) 20 MG tablet Take 1 tablet (20 mg total) by mouth daily. 30 tablet 1  PROAIR HFA 108 (90 Base) MCG/ACT inhaler Inhale 2 puffs into the lungs every 6 (six) hours as needed for wheezing or shortness of breath. 8.5 g 4   rosuvastatin (CRESTOR) 20 MG tablet Take 1 tablet (20 mg total) by mouth daily. 90 tablet 0   azelastine (ASTELIN) 0.1 % nasal spray Place 2 sprays into both nostrils 2 (two) times daily. Use in each nostril as directed (Patient not taking: Reported on 09/11/2022) 30 mL 2   Cholecalciferol 25 MCG (1000 UT) capsule Take 1,000 Units by mouth daily. (Patient not taking: Reported on 01/15/2023)     cyanocobalamin 1000 MCG tablet Take 1,000 mcg by mouth daily. (Patient not taking: Reported on 12/05/2022)     fluticasone (FLONASE) 50 MCG/ACT nasal spray Place into both nostrils daily. (Patient not taking: Reported on 09/11/2022)     montelukast (SINGULAIR) 10 MG tablet Take 1 tablet (10 mg total) by mouth at bedtime. (Patient not taking: Reported on 10/23/2022) 30 tablet 3   No current facility-administered medications for this visit.   Facility-Administered Medications Ordered in Other Visits  Medication Dose Route Frequency Provider Last Rate Last Admin   heparin lock flush 100 UNIT/ML injection            heparin lock flush 100 unit/mL  500 Units Intracatheter Once PRN Cammie Sickle, MD       pembrolizumab [facility]) 200 mg in sodium chloride 0.9 % 50 mL chemo infusion  200 mg Intravenous Once Charlaine Dalton R, MD       sodium chloride flush (NS) 0.9 % injection 10 mL  10 mL Intracatheter PRN Cammie Sickle, MD        Mild maculopapular rash on the [place] area left more than right.  PHYSICAL EXAMINATION: ECOG PERFORMANCE STATUS: 1 - Symptomatic but completely ambulatory  There were no vitals filed for this visit.  There were no vitals filed for  this visit.   Physical Exam HENT:     Head: Normocephalic and atraumatic.     Mouth/Throat:     Pharynx: No oropharyngeal exudate.  Eyes:     Pupils: Pupils are equal, round, and reactive to light.  Cardiovascular:     Rate and Rhythm: Normal rate and regular rhythm.  Pulmonary:     Comments: Decreased breath sounds bilaterally.  No wheeze or crackles Abdominal:     General: Bowel sounds are normal. There is no distension.     Palpations: Abdomen is soft. There is no mass.     Tenderness: There is no abdominal tenderness. There is no guarding or rebound.  Musculoskeletal:        General: No tenderness. Normal range of motion.     Cervical back: Normal range of motion and neck supple.  Skin:    General: Skin is warm.  Neurological:     Mental Status: She is alert and oriented to person, place, and time.  Psychiatric:        Mood and Affect: Affect normal.      LABORATORY DATA:  I have reviewed the data as listed Lab Results  Component Value Date   WBC 8.2 01/15/2023   HGB 14.7 01/15/2023   HCT 43.7 01/15/2023   MCV 85.2 01/15/2023   PLT 185 01/15/2023   Recent Labs    12/25/22 1037 01/14/23 1648 01/15/23 0906  NA 138 141 138  K 3.4* 3.3* 3.4*  CL 101 107 103  CO2 26 26 26   GLUCOSE 106* 121* 99  BUN  19 21 19   CREATININE 0.56 0.68 0.64  CALCIUM 9.2 9.4 9.2  GFRNONAA >60 >60 >60  PROT 7.2 6.8 7.0  ALBUMIN 4.1 3.9 3.8  AST 22 25 20   ALT 15 18 16   ALKPHOS 90 81 83  BILITOT 0.7 0.7 0.6    RADIOGRAPHIC STUDIES: I have personally reviewed the radiological images as listed and agreed with the findings in the report. US PELVIC COMPLETE W TRANSVAGINAL AND TORSION R/O  Result Date: 01/14/2023 CLINICAL DATA:  Pelvic pain EXAM: TRANSABDOMINAL AND TRANSVAGINAL ULTRASOUND OF PELVIS DOPPLER ULTRASOUND OF OVARIES TECHNIQUE: Both transabdominal and transvaginal ultrasound examinations of the pelvis were performed. Transabdominal technique was performed for global  imaging of the pelvis including uterus, ovaries, adnexal regions, and pelvic cul-de-sac. It was necessary to proceed with endovaginal exam following the transabdominal exam to visualize the right ovary. Color and duplex Doppler ultrasound was utilized to evaluate blood flow to the ovaries. COMPARISON:  CT 01/14/2023, ultrasound 09/08/2020 FINDINGS: Uterus Absent. Endometrium Not applicable Right ovary Measurements: 9.5 x 7.4 x 8.0 cm = volume: 297 mL. The right ovary is replaced by a multilocular cystic lesion demonstrating multiple slightly irregular, slightly thickened internal septa but no definite solid component or internal vascularity. The lesion has increased in size since remote prior examination where this measured 6.9 cm in greatest dimension. Left ovary Not visualized.  No adnexal mass. Pulsed Doppler evaluation of the visualized right ovary demonstrates preserved peripheral arterial and venous vascularity. Other findings Multiple nonspecific hyperechoic nodules are seen within the vaginal cuff which may represent lesion such as epidermoid cysts or postsurgical change, measuring up to 8 mm in greatest dimension. IMPRESSION: 1. Interval increase in size of the multilocular cystic lesion within the right ovary, now measuring up to 9.5 cm in greatest dimension demonstrating irregular internal septa, but no solid component or significant internal vascularity. This is best characterized as an oval rads 4 lesion. Gynecologic oncologic consultation is recommended for further management. Contrast enhanced MRI examination may also be helpful for further evaluation. 2. Status post hysterectomy. 3. Nonvisualization of the left ovary. Electronically Signed   By: Fidela Salisbury M.D.   On: 01/14/2023 20:12   CT L-SPINE NO CHARGE  Result Date: 01/14/2023 CLINICAL DATA:  Back pain. Concern for aortic aneurysm. History of non-small cell lung cancer. EXAM: CT THORACIC AND LUMBAR SPINE WITHOUT CONTRAST TECHNIQUE:  Multidetector CT imaging of the thoracic and lumbar spine was performed without contrast. Multiplanar CT image reconstructions were also generated. RADIATION DOSE REDUCTION: This exam was performed according to the departmental dose-optimization program which includes automated exposure control, adjustment of the mA and/or kV according to patient size and/or use of iterative reconstruction technique. COMPARISON:  CT of the chest abdomen pelvis dated 09/26/2022. FINDINGS: CT THORACIC SPINE FINDINGS Alignment: No acute subluxation. Vertebrae: No acute fracture.  Osteopenia. Paraspinal and other soft tissues: Negative. Disc levels: Multilevel degenerative changes with disc space narrowing, endplate irregularity, spurring/osteophyte. There is multilevel sclerotic changes. CT LUMBAR SPINE FINDINGS Segmentation: 5 lumbar type vertebrae. Alignment: No acute subluxation. Vertebrae: Osteopenia.  No acute fracture. Paraspinal and other soft tissues: Negative. Disc levels: Multilevel degenerative changes with disc space narrowing, endplate irregularity, spurring. Multilevel disc desiccation and vacuum phenomena. IMPRESSION: 1. No acute fracture or subluxation of the thoracic or lumbar spine. 2. Multilevel degenerative changes. Electronically Signed   By: Anner Crete M.D.   On: 01/14/2023 18:53   CT T-SPINE NO CHARGE  Result Date: 01/14/2023 CLINICAL DATA:  Back pain. Concern for  aortic aneurysm. History of non-small cell lung cancer. EXAM: CT THORACIC AND LUMBAR SPINE WITHOUT CONTRAST TECHNIQUE: Multidetector CT imaging of the thoracic and lumbar spine was performed without contrast. Multiplanar CT image reconstructions were also generated. RADIATION DOSE REDUCTION: This exam was performed according to the departmental dose-optimization program which includes automated exposure control, adjustment of the mA and/or kV according to patient size and/or use of iterative reconstruction technique. COMPARISON:  CT of the  chest abdomen pelvis dated 09/26/2022. FINDINGS: CT THORACIC SPINE FINDINGS Alignment: No acute subluxation. Vertebrae: No acute fracture.  Osteopenia. Paraspinal and other soft tissues: Negative. Disc levels: Multilevel degenerative changes with disc space narrowing, endplate irregularity, spurring/osteophyte. There is multilevel sclerotic changes. CT LUMBAR SPINE FINDINGS Segmentation: 5 lumbar type vertebrae. Alignment: No acute subluxation. Vertebrae: Osteopenia.  No acute fracture. Paraspinal and other soft tissues: Negative. Disc levels: Multilevel degenerative changes with disc space narrowing, endplate irregularity, spurring. Multilevel disc desiccation and vacuum phenomena. IMPRESSION: 1. No acute fracture or subluxation of the thoracic or lumbar spine. 2. Multilevel degenerative changes. Electronically Signed   By: Anner Crete M.D.   On: 01/14/2023 18:53   CT Angio Chest/Abd/Pel for Dissection W and/or Wo Contrast  Result Date: 01/14/2023 CLINICAL DATA:  Concern for aortic aneurysm. Back pain. Non-small cell lung cancer. EXAM: CT ANGIOGRAPHY CHEST, ABDOMEN AND PELVIS TECHNIQUE: Non-contrast CT of the chest was initially obtained. Multidetector CT imaging through the chest, abdomen and pelvis was performed using the standard protocol during bolus administration of intravenous contrast. Multiplanar reconstructed images and MIPs were obtained and reviewed to evaluate the vascular anatomy. RADIATION DOSE REDUCTION: This exam was performed according to the departmental dose-optimization program which includes automated exposure control, adjustment of the mA and/or kV according to patient size and/or use of iterative reconstruction technique. CONTRAST:  173mL OMNIPAQUE IOHEXOL 350 MG/ML SOLN COMPARISON:  CT dated 12/20/2022. FINDINGS: Evaluation of this exam is limited due to respiratory motion artifact. CTA CHEST FINDINGS Cardiovascular: There is no cardiomegaly or pericardial effusion. There is  coronary vascular calcification. Moderate calcified and noncalcified plaque of the thoracic aorta. No aneurysmal dilatation or dissection. The origins of the great vessels of the aortic arch appear patent. No pulmonary artery embolus identified. Mediastinum/Nodes: There is no hilar or mediastinal adenopathy. The esophagus is grossly unremarkable. No mediastinal fluid collection. Lungs/Pleura: Faint scattered nodular ground-glass densities may be artifactual and related to motion. Atypical pneumonia is not excluded clinical correlation recommended. Similar appearance of several ground-glass nodular density in the posterior right lower lobe dating back to 2020, likely scarring. Attention on follow-up imaging recommended. No consolidative changes. There is no pleural effusion pneumothorax. The central airways are patent. Musculoskeletal: Right-sided Port-A-Cath with tip close to the cavoatrial junction. Degenerative changes of the spine. No acute osseous pathology. Review of the MIP images confirms the above findings. CTA ABDOMEN AND PELVIS FINDINGS VASCULAR Aorta: Advanced atherosclerotic calcification. An infrarenal aorto bi iliac endovascular stent graft noted. The stent appears patent. No aneurysmal dilatation or dissection. No periaortic fluid collection. Celiac: Celiac trunk and its major branches appear patent. SMA: The SMA is patent. Renals: There is atherosclerotic calcification of the renal arteries. The renal arteries patent. IMA: There is poor opacification of the IMA. Inflow: Mild atherosclerotic calcification iliac arteries. The iliac arteries remain patent. No aneurysmal dilatation or dissection. Veins: No obvious venous abnormality within the limitations of this arterial phase study. Review of the MIP images confirms the above findings. NON-VASCULAR No intra-abdominal free air or free fluid. Hepatobiliary: No focal  liver abnormality is seen. No gallstones, gallbladder wall thickening, or biliary  dilatation. Pancreas: Unremarkable. No pancreatic ductal dilatation or surrounding inflammatory changes. Spleen: Normal in size without focal abnormality. Adrenals/Urinary Tract: The adrenal glands unremarkable. There is a 2 cm right renal inferior pole cyst. There is no hydronephrosis on either side. The visualized ureters and urinary bladder appear unremarkable. Stomach/Bowel: There is sigmoid diverticulosis without active inflammatory changes. There is no bowel obstruction or active inflammation The appendix is not visualized with certainty. No inflammatory changes identified in the right lower quadrant. Lymphatic: No adenopathy. Reproductive: Hysterectomy. The left ovary is unremarkable. A septated right ovarian cyst measuring up to 9.3 cm, increased since the prior CT. No metabolic activity was demonstrated on PET-CT of 05/23/2022. Because this lesion is not adequately characterized, prompt [place] is recommended for further evaluation. Note: This recommendation does not apply to premenarchal patients and to those with increased risk (genetic, family history, elevated tumor markers or other high-risk factors) of ovarian cancer. Reference: JACR 2020 Feb; 17(2):248-254 Other: None Musculoskeletal: Osteopenia with extensive degenerative changes of the spine. No acute osseous pathology. Review of the MIP images confirms the above findings. IMPRESSION: 1. No acute intrathoracic, abdominal, or pelvic pathology. No aortic aneurysm or dissection. 2. No evidence of metastatic disease in the chest, abdomen, or pelvis. 3. Several ground-glass/part solid nodular density at the right lung base similar prior CT. No associated metabolic activity on the PET CT. Attention on follow-up imaging recommended. 4. Sigmoid diverticulosis without active inflammatory changes. No bowel obstruction. 5. Enlarging septated right ovarian cyst. No metabolic activity was demonstrated on PET-CT of 05/23/2022. Further evaluation with ultrasound is  recommended. Electronically Signed   By: Anner Crete M.D.   On: 01/14/2023 18:47   CT CHEST ABDOMEN PELVIS W CONTRAST  Result Date: 12/20/2022 CLINICAL DATA:  Non-small cell lung cancer. LEFT lower lobectomy. Recurrence with LEFT adrenal RIGHT ovary metastasis assess treatment response. * Tracking Code: BO * EXAM: CT CHEST, ABDOMEN, AND PELVIS WITH CONTRAST TECHNIQUE: Multidetector CT imaging of the chest, abdomen and pelvis was performed following the standard protocol during bolus administration of intravenous contrast. RADIATION DOSE REDUCTION: This exam was performed according to the departmental dose-optimization program which includes automated exposure control, adjustment of the mA and/or kV according to patient size and/or use of iterative reconstruction technique. CONTRAST:  190mL OMNIPAQUE IOHEXOL 300 MG/ML  SOLN COMPARISON:  CT 09/26/2022 FINDINGS: CT CHEST FINDINGS Cardiovascular: Coronary artery calcification and aortic atherosclerotic calcification. Mediastinum/Nodes: Calcified mediastinal lymph nodes again noted. Bilateral prominent hilar lymph nodes are again noted unchanged. For example 10 mm RIGHT hilar node on image 28/2 unchanged from 10 mm. Lungs/Pleura: Several ground-glass nodules in the RIGHT lower lobe again demonstrated. Example nodule measures 12 mm (image 96/3) not changed from 12 mm. A peripheral nodule RIGHT lower lobe measuring 8 mm (image 89/3) is also unchanged in size. No new ground-glass nodules. Musculoskeletal: No aggressive osseous lesion. CT ABDOMEN AND PELVIS FINDINGS Hepatobiliary: No focal hepatic lesion. No biliary ductal dilatation. Gallbladder is normal. Common bile duct is normal. Pancreas: Pancreas is normal. No ductal dilatation. No pancreatic inflammation. Spleen: Normal spleen Adrenals/urinary tract: LEFT adrenal gland is normal size. Previously enlarged LEFT adrenal metastasis is no longer identifiable. Normal RIGHT adrenal gland. Kidneys normal. Bladder  normal. Stomach/Bowel: Stomach, small bowel, appendix, and cecum are normal. The colon and rectosigmoid colon are normal. Vascular/Lymphatic: Aortic stent graft noted. No abdominopelvic lymphadenopathy. Reproductive: Large cystic lesion of the RIGHT adnexa increased in volume measuring 8.1  x 5.8 cm increased from 6.3 by 4.3 cm. This lesion has simple fluid attenuation internally without clear complexity. No metabolic activity on comparison FDG PET scan. Other: No free fluid. Musculoskeletal: No aggressive osseous lesion. IMPRESSION: CHEST IMPRESSION: 1. Stable ground-glass nodules in the RIGHT lower lobe. 2. Stable borderline hilar adenopathy. 3. No evidence of lung cancer recurrence. PELVIS IMPRESSION: 1. Resolution of LEFT adrenal metastasis. 2. No evidence of new or progressive metastatic disease in the abdomen pelvis. 3. Interval increase in size of RIGHT adnexal cystic lesion. Recommend pelvic ultrasound for further characterization. 4.  Aortic Atherosclerosis (ICD10-I70.0). Electronically Signed   By: Suzy Bouchard M.D.   On: 12/20/2022 14:41     ASSESSMENT & PLAN:   Primary cancer of left lower lobe of lung ([place]) #JUNE 2023-STAGE IV--recurrent/metastatic disease with New left adrenal metastasis;  PET scan JUNE 20th- 2023-  Enlarged 4 cm hypermetabolic LEFT adrenal metastasis;  Suspicion of metastatic adenopathy to the LEFT hilum.  Part solid nodule in the RIGHT lower lobe without metabolic activity. 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 Currently on single agent Keytruda [PD-L1 greater than 80% ].  Currently on single agent Keytruda.    CT FEB 11th, 2024 [ER]- CT CAP- No acute intrathoracic, abdominal, or pelvic pathology. No aortic aneurysm or dissection;  No evidence of metastatic disease in the chest, abdomen, or pelvis. Several ground-glass/part solid nodular density at the right  lung base similar prior CT. see discussion below regarding right ovarian mass.  # Proceed of Keytruda single agent. Labs today reviewed;  acceptable for treatment today. Tolerating well except for mild chills.   # lumbar pain/right x2 weeks; worsening- ? Spasming [Hx of kidney stones]- ? MSK vs others- refer to orthopedic. Also MRI lumbar spine STAT; add valium bid prn; and continue hydrocodone prn/ER.    # Chronic headaches/cervical pain/ visual blurriness- not new. On NSAIDs [; monitor for now. OCT 2023- MRI brain- NEGATIVE for any metastatic disease.  Declined referral to neurology/given previous extensive workup.  Stable.   # Left rib pain-Post throacotomy pain/tingling and numbness:  Continue gabapentin -300 mg TID; and then at extra at night. Stable.   # CAD/ PVD- cramping- s/p evaluation [Dr.Schneir] dec 2023 stable.   # COPD-stable encouraged continue to avoid smoking; again counseled to quit smoking; recommend evaluation with pulmonary. Stable.   #Right mildly complex cystic adnexal mass noted- s/p prior pelvic ultrasound for further work-up [s/p Gyn-Onc; Dr.Berchuck; felt high risk for surgery] 8.1 x 5.8 cm increased from 6.3 by 4.3 cm. Enlarging septated right ovarian cyst. No metabolic activity was demonstrated on PET-CT of 05/23/2022.  Pelvic ultrasound showed increasing size of the ovarian lesion.  Recommend further evaluation with gynecology oncology at next visit-given acute issue of the right hip pain.  # IV Access :s/p  port placement- stable.   # Vaccinations: s/p Pneumonia shot [2022] flu shot/covid- declines  *AM appts- Monday-   # DISPOSITION: # MRI Lumbar spine- STAT # refer to [facility] re: right hip pain ASAP # [name] today #Follow-up in 3 weeks-MD labs/port- cbc/cmp; Keytruda; Dr. B  # I reviewed the blood work- with the patient in detail; also reviewed the imaging independently [as summarized above]; and with the patient in detail.      All questions  were answered. The patient knows to call the clinic with any problems, questions or concerns.  Cammie Sickle, MD 01/15/2023 10:26 AM

## 2023-01-15 NOTE — Telephone Encounter (Signed)
Referral faxed to [facility] per MD orders for back pain and spasms.

## 2023-01-15 NOTE — Patient Instructions (Signed)
[facility]  Discharge Instructions: Thank you for choosing [facility] to provide your oncology and hematology care.  If you have a lab appointment with the [facility], please go directly to the [facility] and check in at the registration area.  Wear comfortable clothing and clothing appropriate for easy access to any Portacath or PICC line.   We strive to give you quality time with your provider. You may need to reschedule your appointment if you arrive late (15 or more minutes).  Arriving late affects you and other patients whose appointments are after yours.  Also, if you miss three or more appointments without notifying the office, you may be dismissed from the clinic at the provider's discretion.      For prescription refill requests, have your pharmacy contact our office and allow 72 hours for refills to be completed.    Today you received the following chemotherapy and/or immunotherapy agents- [name]      To help prevent nausea and vomiting after your treatment, we encourage you to take your nausea medication as directed.  BELOW ARE SYMPTOMS THAT SHOULD BE REPORTED IMMEDIATELY: *FEVER GREATER THAN 100.4 F (38 C) OR HIGHER *CHILLS OR SWEATING *NAUSEA AND VOMITING THAT IS NOT CONTROLLED WITH YOUR NAUSEA MEDICATION *UNUSUAL SHORTNESS OF BREATH *UNUSUAL BRUISING OR BLEEDING *URINARY PROBLEMS (pain or burning when urinating, or frequent urination) *BOWEL PROBLEMS (unusual diarrhea, constipation, pain near the anus) TENDERNESS IN MOUTH AND THROAT WITH OR WITHOUT PRESENCE OF ULCERS (sore throat, sores in mouth, or a toothache) UNUSUAL RASH, SWELLING OR PAIN  UNUSUAL VAGINAL DISCHARGE OR ITCHING   Items with * indicate a potential emergency and should be followed up as soon as possible or go to the Emergency Department if any problems should occur.  Please show the CHEMOTHERAPY ALERT CARD or IMMUNOTHERAPY ALERT CARD at check-in to  the Emergency Department and triage nurse.  Should you have questions after your visit or need to cancel or reschedule your appointment, please contact [facility]  [phone] and follow the prompts.  Office hours are 8:00 a.m. to 4:30 p.m. Monday - Friday. Please note that voicemails left after 4:00 p.m. may not be returned until the following business day.  We are closed weekends and major holidays. You have access to a nurse at all times for urgent questions. Please call the main number to the clinic [phone] and follow the prompts.  For any non-urgent questions, you may also contact your provider using MyChart. We now offer e-Visits for anyone 47 and older to request care online for non-urgent symptoms. For details visit [email or web address].   Also download the MyChart app! Go to the app store, search "MyChart", open the app, select [facility], and log in with your MyChart username and password.

## 2023-01-15 NOTE — Telephone Encounter (Signed)
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 Not indicative of cancer. A referral was sent to [facility] in [place].

## 2023-01-15 NOTE — Patient Instructions (Signed)

## 2023-01-16 ENCOUNTER — Other Ambulatory Visit: Payer: Self-pay | Admitting: *Deleted

## 2023-01-16 DIAGNOSIS — M546 Pain in thoracic spine: Secondary | ICD-10-CM

## 2023-01-18 ENCOUNTER — Other Ambulatory Visit: Payer: Self-pay | Admitting: Family Medicine

## 2023-01-18 DIAGNOSIS — Z1231 Encounter for screening mammogram for malignant neoplasm of breast: Secondary | ICD-10-CM

## 2023-01-19 DIAGNOSIS — M545 Low back pain, unspecified: Secondary | ICD-10-CM | POA: Diagnosis not present

## 2023-01-22 ENCOUNTER — Telehealth: Payer: Self-pay | Admitting: Family Medicine

## 2023-01-22 NOTE — Telephone Encounter (Signed)
Contacted Myria Steenbergen to schedule their annual wellness visit. Appointment made for 02/20/2023.  [facility] Direct Dial: [phone]

## 2023-02-05 ENCOUNTER — Inpatient Hospital Stay: Payer: Medicare Other

## 2023-02-05 ENCOUNTER — Inpatient Hospital Stay: Payer: Medicare Other | Attending: Internal Medicine | Admitting: Internal Medicine

## 2023-02-05 ENCOUNTER — Encounter: Payer: Self-pay | Admitting: Internal Medicine

## 2023-02-05 VITALS — Temp 97.6°F

## 2023-02-05 VITALS — BP 123/84 | HR 74 | Resp 18 | Ht 68.0 in | Wt 213.0 lb

## 2023-02-05 DIAGNOSIS — C3432 Malignant neoplasm of lower lobe, left bronchus or lung: Secondary | ICD-10-CM | POA: Diagnosis not present

## 2023-02-05 DIAGNOSIS — Z79620 Long term (current) use of immunosuppressive biologic: Secondary | ICD-10-CM | POA: Insufficient documentation

## 2023-02-05 DIAGNOSIS — Z5112 Encounter for antineoplastic immunotherapy: Secondary | ICD-10-CM | POA: Diagnosis not present

## 2023-02-05 DIAGNOSIS — Z79899 Other long term (current) drug therapy: Secondary | ICD-10-CM | POA: Diagnosis not present

## 2023-02-05 DIAGNOSIS — C7972 Secondary malignant neoplasm of left adrenal gland: Secondary | ICD-10-CM | POA: Insufficient documentation

## 2023-02-05 DIAGNOSIS — E876 Hypokalemia: Secondary | ICD-10-CM | POA: Insufficient documentation

## 2023-02-05 DIAGNOSIS — Z87891 Personal history of nicotine dependence: Secondary | ICD-10-CM | POA: Insufficient documentation

## 2023-02-05 LAB — CBC WITH DIFFERENTIAL/PLATELET
Abs Immature Granulocytes: 0.01 10*3/uL (ref 0.00–0.07)
Basophils Absolute: 0.1 10*3/uL (ref 0.0–0.1)
Basophils Relative: 1 %
Eosinophils Absolute: 0.3 10*3/uL (ref 0.0–0.5)
Eosinophils Relative: 4 %
HCT: 45.8 % (ref 36.0–46.0)
Hemoglobin: 15.4 g/dL — ABNORMAL HIGH (ref 12.0–15.0)
Immature Granulocytes: 0 %
Lymphocytes Relative: 32 %
Lymphs Abs: 2.9 10*3/uL (ref 0.7–4.0)
MCH: 28.4 pg (ref 26.0–34.0)
MCHC: 33.6 g/dL (ref 30.0–36.0)
MCV: 84.5 fL (ref 80.0–100.0)
Monocytes Absolute: 0.7 10*3/uL (ref 0.1–1.0)
Monocytes Relative: 7 %
Neutro Abs: 5.3 10*3/uL (ref 1.7–7.7)
Neutrophils Relative %: 56 %
Platelets: 210 10*3/uL (ref 150–400)
RBC: 5.42 MIL/uL — ABNORMAL HIGH (ref 3.87–5.11)
RDW: 13.4 % (ref 11.5–15.5)
WBC: 9.2 10*3/uL (ref 4.0–10.5)
nRBC: 0 % (ref 0.0–0.2)

## 2023-02-05 LAB — COMPREHENSIVE METABOLIC PANEL
ALT: 20 U/L (ref 0–44)
AST: 22 U/L (ref 15–41)
Albumin: 3.8 g/dL (ref 3.5–5.0)
Alkaline Phosphatase: 87 U/L (ref 38–126)
Anion gap: 10 (ref 5–15)
BUN: 23 mg/dL (ref 8–23)
CO2: 25 mmol/L (ref 22–32)
Calcium: 9.5 mg/dL (ref 8.9–10.3)
Chloride: 104 mmol/L (ref 98–111)
Creatinine, Ser: 0.72 mg/dL (ref 0.44–1.00)
GFR, Estimated: >60 mL/min (ref >60)
Glucose, Bld: 105 mg/dL — ABNORMAL HIGH (ref 70–99)
Potassium: 3.4 mmol/L — ABNORMAL LOW (ref 3.5–5.1)
Sodium: 139 mmol/L (ref 135–145)
Total Bilirubin: 0.7 mg/dL (ref 0.3–1.2)
Total Protein: 7.1 g/dL (ref 6.5–8.1)

## 2023-02-05 MED ORDER — HEPARIN SOD (PORK) LOCK FLUSH 100 UNIT/ML IV SOLN
500.0000 [IU] | Freq: Once | INTRAVENOUS | Status: AC | PRN
  Administered 2023-02-05: 500 [IU]
  Filled 2023-02-05: qty 5

## 2023-02-05 MED ORDER — SODIUM CHLORIDE 0.9 % IV SOLN
Freq: Once | INTRAVENOUS | Status: AC
  Filled 2023-02-05: qty 250

## 2023-02-05 MED ORDER — SODIUM CHLORIDE 0.9 % IV SOLN
200.0000 mg | Freq: Once | INTRAVENOUS | Status: AC
  Administered 2023-02-05: 200 mg via INTRAVENOUS
  Filled 2023-02-05: qty 8

## 2023-02-05 MED ORDER — SODIUM CHLORIDE 0.9% FLUSH
10.0000 mL | INTRAVENOUS | Status: DC | PRN
  Filled 2023-02-05: qty 10

## 2023-02-05 NOTE — Progress Notes (Signed)
Patient states that she is feeling somewhat better than she was at her last visit.

## 2023-02-05 NOTE — Patient Instructions (Signed)
[facility]  Discharge Instructions: Thank you for choosing [facility] to provide your oncology and hematology care.  If you have a lab appointment with the [facility], please go directly to the [facility] and check in at the registration area.  Wear comfortable clothing and clothing appropriate for easy access to any Portacath or PICC line.   We strive to give you quality time with your provider. You may need to reschedule your appointment if you arrive late (15 or more minutes).  Arriving late affects you and other patients whose appointments are after yours.  Also, if you miss three or more appointments without notifying the office, you may be dismissed from the clinic at the provider's discretion.      For prescription refill requests, have your pharmacy contact our office and allow 72 hours for refills to be completed.    Today you received the following chemotherapy and/or immunotherapy agents [name]      To help prevent nausea and vomiting after your treatment, we encourage you to take your nausea medication as directed.  BELOW ARE SYMPTOMS THAT SHOULD BE REPORTED IMMEDIATELY: *FEVER GREATER THAN 100.4 F (38 C) OR HIGHER *CHILLS OR SWEATING *NAUSEA AND VOMITING THAT IS NOT CONTROLLED WITH YOUR NAUSEA MEDICATION *UNUSUAL SHORTNESS OF BREATH *UNUSUAL BRUISING OR BLEEDING *URINARY PROBLEMS (pain or burning when urinating, or frequent urination) *BOWEL PROBLEMS (unusual diarrhea, constipation, pain near the anus) TENDERNESS IN MOUTH AND THROAT WITH OR WITHOUT PRESENCE OF ULCERS (sore throat, sores in mouth, or a toothache) UNUSUAL RASH, SWELLING OR PAIN  UNUSUAL VAGINAL DISCHARGE OR ITCHING   Items with * indicate a potential emergency and should be followed up as soon as possible or go to the Emergency Department if any problems should occur.  Please show the CHEMOTHERAPY ALERT CARD or IMMUNOTHERAPY ALERT CARD at check-in to  the Emergency Department and triage nurse.  Should you have questions after your visit or need to cancel or reschedule your appointment, please contact [facility]  [phone] and follow the prompts.  Office hours are 8:00 a.m. to 4:30 p.m. Monday - Friday. Please note that voicemails left after 4:00 p.m. may not be returned until the following business day.  We are closed weekends and major holidays. You have access to a nurse at all times for urgent questions. Please call the main number to the clinic [phone] and follow the prompts.  For any non-urgent questions, you may also contact your provider using MyChart. We now offer e-Visits for anyone 37 and older to request care online for non-urgent symptoms. For details visit [email or web address].   Also download the MyChart app! Go to the app store, search "MyChart", open the app, select [facility], and log in with your MyChart username and password.

## 2023-02-05 NOTE — Assessment & Plan Note (Addendum)
#  JUNE 2023-STAGE IV--recurrent/metastatic disease with New left adrenal metastasis;  PET scan JUNE 20th- 2023-  Enlarged 4 cm hypermetabolic LEFT adrenal metastasis;  Suspicion of metastatic adenopathy to the LEFT hilum.  Part solid nodule in the RIGHT lower lobe without metabolic activity. 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 Currently on single agent Keytruda [PD-L1 greater than 80% ].  Currently on single agent Keytruda.    CT FEB 11th, 2024 [ER]- CT CAP- No acute intrathoracic, abdominal, or pelvic pathology. No aortic aneurysm or dissection;  No evidence of metastatic disease in the chest, abdomen, or pelvis. Several ground-glass/part solid nodular density at the right lung base similar prior CT. see discussion below regarding right ovarian mass.  # Proceed of Keytruda single agent. Labs today reviewed;  acceptable for treatment today. Tolerating well except for mild chills.   # mild Hypokalemia: discussed dietary supplement.   # Chronic pain: headaches/cervical pain/back pain [Dr.Naveira; pain doctor]  On NSAIDs [ monitor for now. OCT 2023- MRI brain- NEGATIVE for any metastatic disease.   Stable.  FEB 2024- MRI lumbar spine- degenerative disease- on ibuprofen prn- BID. stable  # Left rib pain-Post throacotomy pain/tingling and numbness:  Continue gabapentin -300 mg TID; and then at extra at night prn. stable  # CAD/ PVD- cramping- s/p evaluation [Dr.Schneir] dec 2023 stable.   # COPD-stable encouraged continue to avoid smoking; again counseled to quit smoking; recommend evaluation with pulmonary. stable.   #Right mildly complex cystic adnexal mass noted- s/p prior pelvic ultrasound for further work-up [s/p Gyn-Onc; Dr.Berchuck; felt high risk for surgery] 8.1 x 5.8 cm increased from 6.3 by 4.3 cm. Enlarging septated right ovarian cyst. No metabolic activity was demonstrated on  PET-CT of 05/23/2022.  Pelvic ultrasound showed increasing size of the ovarian lesion.  Discussed with gyn-Onc.   # IV Access :s/p  port placement- stable.   *AM appts- Monday-   # DISPOSITION: # keytruda today #Follow-up in 3 weeks-X- MD labs/port- cbc/cmp; Keytruda; Dr. Jacinto Reap

## 2023-02-05 NOTE — Progress Notes (Signed)
[place] NOTE  Patient Care Team: Birdie Sons, MD as PCP - General (Family Medicine) Ubaldo Glassing Javier Docker, MD as Consulting Physician (Cardiology) Schnier, Dolores Lory, MD (Vascular Surgery) Vladimir Crofts, MD as Consulting Physician (Neurology) Milinda Pointer, MD as Referring Physician (Pain Medicine) [facility] (Optometry) Ubaldo Glassing, Javier Docker, MD as Consulting Physician (Cardiology) Virgel Manifold, MD (Inactive) as Consulting Physician (Gastroenterology) Gae Dry, MD as Referring Physician (Obstetrics and Gynecology) Telford Nab, RN as Oncology Nurse Navigator Cammie Sickle, MD as Consulting Physician (Oncology)   CHIEF COMPLAINTS/PURPOSE OF CONSULTATION: lung cancer  #  Oncology History Overview Note  #MAY 2022- LLL nodule 2.3 cm Adenocarcinoma Stage IA; s/p lobectomy.  No adjuvant therapy  # CT scan 4th June 2023-highly concerning for recurrent/metastatic disease with-. New left adrenal metastasis;  Ground-glass and part solid nodules in the peripheral right lower lobe, unchanged from 11/07/2021 but slightly enlarged from 01/01/2018. Findings are suspicious for indolent adenocarcinoma.  F One- PFL1 =80%; KRAS G12C PET scan JUNE 20th- 2023-  Enlarged 4 cm hypermetabolic LEFT adrenal metastasis;  Suspicion of metastatic adenopathy to the LEFT hilum.  Part solid nodule in the RIGHT lower lobe without metabolic activity.   3 ADRENAL BIOPSY- CK7 POSITIVE ADENOCARCINOMA- There is limited tissue remaining for ancillary testing, not likely  sufficient for NGS testing.   # AUG 7th, 2023-single agent Keytruda.    Primary cancer of left lower lobe of lung ([place])  05/09/2021 Initial Diagnosis   Primary cancer of left lower lobe of lung ([place])   07/04/2022 -  Chemotherapy   Patient is on Treatment Plan : LUNG NSCLC Pembrolizumab (200) q21d     07/10/2022 - 07/10/2022 Chemotherapy   Patient is on Treatment Plan : LUNG NSCLC Pembrolizumab  (200) q21d        HISTORY OF PRESENTING ILLNESS: Patient  ambulating.  Alone.   Laura Nielsen 67 y.o.  female history of smoking; prior history of lung cancer-with likely recurrence to left adrenal gland [s/p Bx CK-7 positive TTF-1 negative]-clinically suggestive of recurrent lung cancer on single agent [name] is here for follow-up/ review the results of MRI back.   Patient states that she is feeling somewhat better than she was at her last visit. Back pain improved, continues to be on ibuprofen.   Denies any diarrhea.  Denies any abdominal pain. Otherwise patient continues to have mild shortness of breath on exertion.  Denies any headaches.  Denies any nausea vomiting. Complains of cramping in the legs chronic.   Review of Systems  Constitutional:  Negative for chills, diaphoresis, fever, malaise/fatigue and weight loss.  HENT:  Negative for nosebleeds and sore throat.   Eyes:  Negative for double vision.  Respiratory:  Negative for cough, hemoptysis, sputum production, shortness of breath and wheezing.   Cardiovascular:  Positive for chest pain. Negative for palpitations, orthopnea and leg swelling.  Gastrointestinal:  Negative for abdominal pain, blood in stool, constipation, diarrhea, heartburn, melena, nausea and vomiting.  Genitourinary:  Negative for dysuria, frequency and urgency.  Musculoskeletal:  Positive for back pain and joint pain.  Skin: Negative.  Negative for itching and rash.  Neurological:  Positive for tingling. Negative for dizziness, focal weakness, weakness and headaches.  Endo/Heme/Allergies:  Does not bruise/bleed easily.  Psychiatric/Behavioral:  Negative for depression. The patient is not nervous/anxious and does not have insomnia.      MEDICAL HISTORY:  Past Medical History:  Diagnosis Date   AAA (abdominal aortic aneurysm) (  [place])    s/p EVAR AAA 03/27/13   Arthritis    Asthma    Cancer ([place])    Complication of anesthesia    BP drops after   History of  kidney stones    Osteoporosis    Sleep apnea     SURGICAL HISTORY: Past Surgical History:  Procedure Laterality Date   ABDOMINAL AORTIC ENDOVASCULAR STENT GRAFT  03/27/2013   Dr. Hortencia Pilar   Cardiac catheterization  02/2009   70-80% stenosis RCA stent placed. started on Plavix   CARDIAC CATHETERIZATION     COLONOSCOPY WITH PROPOFOL N/A 01/05/2021   Procedure: COLONOSCOPY WITH PROPOFOL;  Surgeon: Virgel Manifold, MD;  Location: [facility] ENDOSCOPY;  Service: Endoscopy;  Laterality: N/A;   CORONARY ANGIOPLASTY     stent placed   ESOPHAGOGASTRODUODENOSCOPY (EGD) WITH PROPOFOL N/A 01/05/2021   Procedure: ESOPHAGOGASTRODUODENOSCOPY (EGD) WITH PROPOFOL;  Surgeon: Virgel Manifold, MD;  Location: [facility] ENDOSCOPY;  Service: Endoscopy;  Laterality: N/A;   INTERCOSTAL NERVE BLOCK Left 04/06/2021   Procedure: INTERCOSTAL NERVE BLOCK;  Surgeon: Melrose Nakayama, MD;  Location: [facility];  Service: Thoracic;  Laterality: Left;   IR IMAGING GUIDED PORT INSERTION  06/20/2022   KIDNEY STONE SURGERY  1999   LUNG LOBECTOMY Left 04/06/2021   robotic left lower lobectomy 04/06/2021 Dr. Roxan Hockey for Stage 1A adenocarcinoma   NODE DISSECTION Left 04/06/2021   Procedure: NODE DISSECTION;  Surgeon: Melrose Nakayama, MD;  Location: [facility];  Service: Thoracic;  Laterality: Left;   TUBAL LIGATION     VAGINAL HYSTERECTOMY     Menometrorrhagia. Excessive bleeding. Unknown if cervix removed.     SOCIAL HISTORY: Social History   Socioeconomic History   Marital status: Divorced    Spouse name: Not on file   Number of children: 2   Years of education: H/S   Highest education level: High school graduate  Occupational History   Occupation: Disabled   Occupation: retired  Tobacco Use   Smoking status: Former    Packs/day: 0.25    Years: 44.50    Total pack years: 11.13    Types: Cigarettes    Quit date: 04/05/2021    Years since quitting: 1.8   Smokeless tobacco: Never   Tobacco  comments:    12/23/19 states she quit for 3 months and then started back. Pt is going to talk with MD about this.  Vaping Use   Vaping Use: Never used  Substance and Sexual Activity   Alcohol use: Yes    Comment: rarely - once a year   Drug use: No   Sexual activity: Not on file  Other Topics Concern   Not on file  Social History Narrative   Hx of smoking; quit prior to lung surgery. Lives in [place]- self. No alcohol. 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 On disability sec to spinal pain.    Social Determinants of Health   Financial Resource Strain: Low Risk  (02/10/2021)   Overall Financial Resource Strain (CARDIA)    Difficulty of Paying Living Expenses: Not hard at all  Food Insecurity: No Food Insecurity (03/21/2022)   Hunger Vital Sign    Worried About Running Out of Food in the Last Year: Never true    Ran Out of Food in the Last Year: Never true  Transportation Needs: No Transportation Needs (03/21/2022)   PRAPARE - Hydrologist (Medical): No    Lack of Transportation (Non-Medical): No  Physical Activity: Inactive (  02/10/2021)   Exercise Vital Sign    Days of Exercise per Week: 0 days    Minutes of Exercise per Session: 0 min  Stress: Stress Concern Present (02/10/2021)   [place]    Feeling of Stress : To some extent  Social Connections: Socially Isolated (02/10/2021)   Social Connection and Isolation Panel [NHANES]    Frequency of Communication with Friends and Family: More than three times a week    Frequency of Social Gatherings with Friends and Family: Once a week    Attends Religious Services: Never    Marine scientist or Organizations: No    Attends Archivist Meetings: Never    Marital Status: Divorced  Human resources officer Violence: Not At Risk (02/10/2021)   Humiliation, Afraid, Rape, and Kick questionnaire    Fear of Current or Ex-Partner: No     Emotionally Abused: No    Physically Abused: No    Sexually Abused: No    FAMILY HISTORY: Family History  Problem Relation Age of Onset   Hypertension Mother    Coronary artery disease Mother    Heart attack Mother        acute   Cancer Mother    Alcohol abuse Father    Depression Father    Hypertension Father    Heart attack Father 64       acute   Alcohol abuse Sister    Hyperlipidemia Sister    Hypertension Sister    Cancer Sister 8   Heart attack Sister        x's 2   Coronary artery disease Sister 2       x's 2   Breast cancer Sister 64    ALLERGIES:  is allergic to atorvastatin, omeprazole, and bupropion.  MEDICATIONS:  Current Outpatient Medications  Medication Sig Dispense Refill   allopurinol (ZYLOPRIM) 100 MG tablet Take 1 tablet (100 mg total) by mouth daily. 90 tablet 3   aspirin 81 MG tablet Take 81 mg by mouth daily.      bisoprolol (ZEBETA) 10 MG tablet Take 1 tablet (10 mg total) by mouth daily. 90 tablet 4   clopidogrel (PLAVIX) 75 MG tablet Take 1 tablet (75 mg total) by mouth daily. 90 tablet 4   diazepam (VALIUM) 5 MG tablet Take 1 tablet (5 mg total) by mouth every 12 (twelve) hours as needed. 30 tablet 0   gabapentin (NEURONTIN) 300 MG capsule Take 1 capsule (300 mg total) by mouth 3 (three) times daily AND 2 capsules (600 mg total) at bedtime.     HYDROcodone-acetaminophen (NORCO/VICODIN) 5-325 MG tablet Take 1-2 tablets by mouth every 6 (six) hours as needed for moderate pain. 20 tablet 0   ibuprofen (ADVIL) 200 MG tablet Take 400 mg by mouth 2 (two) times daily as needed (headaches).     lidocaine-prilocaine (EMLA) cream Apply on the port. 30 -45 min  prior to port access. 30 g 3   lisinopril-hydrochlorothiazide (ZESTORETIC) 20-12.5 MG tablet Take 1 tablet by mouth daily. 90 tablet 0   OZEMPIC, 1 MG/DOSE, 4 MG/3ML SOPN Inject 1 mg into the skin once a week. 3 mL 4   pantoprazole (PROTONIX) 20 MG tablet Take 1 tablet (20 mg total) by mouth daily.  30 tablet 1   PROAIR HFA 108 (90 Base) MCG/ACT inhaler Inhale 2 puffs into the lungs every 6 (six) hours as needed for wheezing or shortness of breath. 8.5 g 4  rosuvastatin (CRESTOR) 20 MG tablet Take 1 tablet (20 mg total) by mouth daily. 90 tablet 0   azelastine (ASTELIN) 0.1 % nasal spray Place 2 sprays into both nostrils 2 (two) times daily. Use in each nostril as directed (Patient not taking: Reported on 09/11/2022) 30 mL 2   Cholecalciferol 25 MCG (1000 UT) capsule Take 1,000 Units by mouth daily. (Patient not taking: Reported on 01/15/2023)     cyanocobalamin 1000 MCG tablet Take 1,000 mcg by mouth daily. (Patient not taking: Reported on 12/05/2022)     fluticasone (FLONASE) 50 MCG/ACT nasal spray Place into both nostrils daily. (Patient not taking: Reported on 09/11/2022)     montelukast (SINGULAIR) 10 MG tablet Take 1 tablet (10 mg total) by mouth at bedtime. (Patient not taking: Reported on 10/23/2022) 30 tablet 3   No current facility-administered medications for this visit.   Facility-Administered Medications Ordered in Other Visits  Medication Dose Route Frequency Provider Last Rate Last Admin   heparin lock flush 100 UNIT/ML injection             Mild maculopapular rash on the [place] area left more than right.  PHYSICAL EXAMINATION: ECOG PERFORMANCE STATUS: 1 - Symptomatic but completely ambulatory  Vitals:   02/05/23 0901  BP: 123/84  Pulse: 74  Resp: 18  SpO2: 99%    Filed Weights   02/05/23 0858  Weight: 213 lb (96.6 kg)     Physical Exam HENT:     Head: Normocephalic and atraumatic.     Mouth/Throat:     Pharynx: No oropharyngeal exudate.  Eyes:     Pupils: Pupils are equal, round, and reactive to light.  Cardiovascular:     Rate and Rhythm: Normal rate and regular rhythm.  Pulmonary:     Comments: Decreased breath sounds bilaterally.  No wheeze or crackles Abdominal:     General: Bowel sounds are normal. There is no distension.     Palpations: Abdomen  is soft. There is no mass.     Tenderness: There is no abdominal tenderness. There is no guarding or rebound.  Musculoskeletal:        General: No tenderness. Normal range of motion.     Cervical back: Normal range of motion and neck supple.  Skin:    General: Skin is warm.  Neurological:     Mental Status: She is alert and oriented to person, place, and time.  Psychiatric:        Mood and Affect: Affect normal.      LABORATORY DATA:  I have reviewed the data as listed Lab Results  Component Value Date   WBC 9.2 02/05/2023   HGB 15.4 (H) 02/05/2023   HCT 45.8 02/05/2023   MCV 84.5 02/05/2023   PLT 210 02/05/2023   Recent Labs    01/14/23 1648 01/15/23 0906 02/05/23 0849  NA 141 138 139  K 3.3* 3.4* 3.4*  CL 107 103 104  CO2 '26 26 25  '$ GLUCOSE 121* 99 105*  BUN '21 19 23  '$ CREATININE 0.68 0.64 0.72  CALCIUM 9.4 9.2 9.5  GFRNONAA >60 >60 >60  PROT 6.8 7.0 7.1  ALBUMIN 3.9 3.8 3.8  AST '25 20 22  '$ ALT '18 16 20  '$ ALKPHOS 81 83 87  BILITOT 0.7 0.6 0.7    RADIOGRAPHIC STUDIES: I have personally reviewed the radiological images as listed and agreed with the findings in the report. MR Lumbar Spine W Wo Contrast  Result Date: 01/15/2023 CLINICAL DATA:  Low back  pain. History of primary lung cancer and cystic right adnexal mass. EXAM: MRI LUMBAR SPINE WITHOUT AND WITH CONTRAST TECHNIQUE: Multiplanar and multiecho pulse sequences of the lumbar spine were obtained without and with intravenous contrast. CONTRAST:  21m GADAVIST GADOBUTROL 1 MMOL/ML IV SOLN COMPARISON:  Pelvic ultrasound 01/14/2023. CT lumbar spine 01/14/2023, abdominopelvic CT 12/20/2022 and lumbar MRI 03/28/2017. FINDINGS: Segmentation: Conventional anatomy assumed, with the last open disc space designated L5-S1.Utilizing this numbering, L5 is transitional with a right-sided lumbosacral assimilation joint. Concordant with prior imaging. Alignment: Convex right lumbar scoliosis. Near anatomic sagittal alignment.  Vertebrae: No worrisome osseous lesion, acute fracture or pars defect. Chronic endplate degenerative changes, most advanced at T12-L1. Mild sacroiliac degenerative changes bilaterally. Conus medullaris: Extends to the L1-2 level and appears normal. Paraspinal and other soft tissues: No significant paraspinal findings. Previous aorto bi-iliac endograft stenting. Known cystic right adnexal mass is partially imaged and not further characterized by this examination. Disc levels: T11-12: Sagittal imaging demonstrates annular disc bulging and endplate osteophytes, but no significant spinal stenosis or nerve root encroachment. T12-L1: Chronic loss of disc height with annular disc bulging and endplate osteophytes asymmetric to the right. There are chronic central osteophytes without mass effect on the distal thoracic cord. Mild chronic right lateral recess and right foraminal narrowing. L1-2: Chronic loss of disc height with disc bulging and endplate osteophytes asymmetric to the right. Mild facet and ligamentous hypertrophy. Mild narrowing of the lateral recesses and right foramen without nerve root encroachment. L2-3: Progressive loss of disc height with annular disc bulging and endplate osteophytes asymmetric to the right. Moderate facet and ligamentous hypertrophy. Mildly progressive multifactorial spinal stenosis, still moderate. There is asymmetric narrowing of the right lateral recess and right foramen. L3-4: Progressive loss of disc height with annular disc bulging and endplate osteophytes asymmetric to the left. Moderate facet and ligamentous hypertrophy. Moderate multifactorial spinal stenosis with mild lateral recess and foraminal narrowing bilaterally. L4-5: Chronic loss of disc height with annular disc bulging and endplate osteophytes asymmetric to the left. Bilateral facet hypertrophy, severe on the left. Stable mild multifactorial spinal stenosis with asymmetric lateral recess and foraminal narrowing on the  left. L5-S1: As numbered, this disc space level is transitional with a well hydrated disc. Bilateral facet hypertrophy without resulting spinal stenosis or nerve root encroachment. IMPRESSION: 1. Transitional lumbosacral anatomy. As on prior imaging, the transitional segment is assigned L5. 2. No acute findings or evidence of osseous metastatic disease. 3. Multilevel spondylosis has mildly progressed from previous MRI 03/28/2017. At L2-3, there is progressive multifactorial spinal stenosis with asymmetric narrowing of the right lateral recess and right foramen. 4. Similar moderate multifactorial spinal stenosis at L3-4 with mild lateral recess and foraminal narrowing bilaterally. 5. Similar mild multifactorial spinal stenosis at L4-5 with asymmetric left lateral recess and left foraminal narrowing. 6. A known right adnexal cystic mass is partially imaged, evaluated by ultrasound yesterday. Please refer to that report for follow-up recommendations. Electronically Signed   By: WRichardean SaleM.D.   On: 01/15/2023 14:56   [place] COMPLETE W TRANSVAGINAL AND TORSION R/O  Result Date: 01/14/2023 CLINICAL DATA:  Pelvic pain EXAM: TRANSABDOMINAL AND TRANSVAGINAL ULTRASOUND OF PELVIS DOPPLER ULTRASOUND OF OVARIES TECHNIQUE: Both transabdominal and transvaginal ultrasound examinations of the pelvis were performed. Transabdominal technique was performed for global imaging of the pelvis including uterus, ovaries, adnexal regions, and pelvic cul-de-sac. It was necessary to proceed with endovaginal exam following the transabdominal exam to visualize the right ovary. Color and duplex Doppler ultrasound  was utilized to evaluate blood flow to the ovaries. COMPARISON:  CT 01/14/2023, ultrasound 09/08/2020 FINDINGS: Uterus Absent. Endometrium Not applicable Right ovary Measurements: 9.5 x 7.4 x 8.0 cm = volume: 297 mL. The right ovary is replaced by a multilocular cystic lesion demonstrating multiple slightly irregular,  slightly thickened internal septa but no definite solid component or internal vascularity. The lesion has increased in size since remote prior examination where this measured 6.9 cm in greatest dimension. Left ovary Not visualized.  No adnexal mass. Pulsed Doppler evaluation of the visualized right ovary demonstrates preserved peripheral arterial and venous vascularity. Other findings Multiple nonspecific hyperechoic nodules are seen within the vaginal cuff which may represent lesion such as epidermoid cysts or postsurgical change, measuring up to 8 mm in greatest dimension. IMPRESSION: 1. Interval increase in size of the multilocular cystic lesion within the right ovary, now measuring up to 9.5 cm in greatest dimension demonstrating irregular internal septa, but no solid component or significant internal vascularity. This is best characterized as an oval rads 4 lesion. Gynecologic oncologic consultation is recommended for further management. Contrast enhanced MRI examination may also be helpful for further evaluation. 2. Status post hysterectomy. 3. Nonvisualization of the left ovary. Electronically Signed   By: Fidela Salisbury M.D.   On: 01/14/2023 20:12   CT L-SPINE NO CHARGE  Result Date: 01/14/2023 CLINICAL DATA:  Back pain. Concern for aortic aneurysm. History of non-small cell lung cancer. EXAM: CT THORACIC AND LUMBAR SPINE WITHOUT CONTRAST TECHNIQUE: Multidetector CT imaging of the thoracic and lumbar spine was performed without contrast. Multiplanar CT image reconstructions were also generated. RADIATION DOSE REDUCTION: This exam was performed according to the departmental dose-optimization program which includes automated exposure control, adjustment of the mA and/or kV according to patient size and/or use of iterative reconstruction technique. COMPARISON:  CT of the chest abdomen pelvis dated 09/26/2022. FINDINGS: CT THORACIC SPINE FINDINGS Alignment: No acute subluxation. Vertebrae: No acute fracture.   Osteopenia. Paraspinal and other soft tissues: Negative. Disc levels: Multilevel degenerative changes with disc space narrowing, endplate irregularity, spurring/osteophyte. There is multilevel sclerotic changes. CT LUMBAR SPINE FINDINGS Segmentation: 5 lumbar type vertebrae. Alignment: No acute subluxation. Vertebrae: Osteopenia.  No acute fracture. Paraspinal and other soft tissues: Negative. Disc levels: Multilevel degenerative changes with disc space narrowing, endplate irregularity, spurring. Multilevel disc desiccation and vacuum phenomena. IMPRESSION: 1. No acute fracture or subluxation of the thoracic or lumbar spine. 2. Multilevel degenerative changes. Electronically Signed   By: Anner Crete M.D.   On: 01/14/2023 18:53   CT T-SPINE NO CHARGE  Result Date: 01/14/2023 CLINICAL DATA:  Back pain. Concern for aortic aneurysm. History of non-small cell lung cancer. EXAM: CT THORACIC AND LUMBAR SPINE WITHOUT CONTRAST TECHNIQUE: Multidetector CT imaging of the thoracic and lumbar spine was performed without contrast. Multiplanar CT image reconstructions were also generated. RADIATION DOSE REDUCTION: This exam was performed according to the departmental dose-optimization program which includes automated exposure control, adjustment of the mA and/or kV according to patient size and/or use of iterative reconstruction technique. COMPARISON:  CT of the chest abdomen pelvis dated 09/26/2022. FINDINGS: CT THORACIC SPINE FINDINGS Alignment: No acute subluxation. Vertebrae: No acute fracture.  Osteopenia. Paraspinal and other soft tissues: Negative. Disc levels: Multilevel degenerative changes with disc space narrowing, endplate irregularity, spurring/osteophyte. There is multilevel sclerotic changes. CT LUMBAR SPINE FINDINGS Segmentation: 5 lumbar type vertebrae. Alignment: No acute subluxation. Vertebrae: Osteopenia.  No acute fracture. Paraspinal and other soft tissues: Negative. Disc levels: Multilevel  degenerative changes  with disc space narrowing, endplate irregularity, spurring. Multilevel disc desiccation and vacuum phenomena. IMPRESSION: 1. No acute fracture or subluxation of the thoracic or lumbar spine. 2. Multilevel degenerative changes. Electronically Signed   By: Anner Crete M.D.   On: 01/14/2023 18:53   CT Angio Chest/Abd/Pel for Dissection W and/or Wo Contrast  Result Date: 01/14/2023 CLINICAL DATA:  Concern for aortic aneurysm. Back pain. Non-small cell lung cancer. EXAM: CT ANGIOGRAPHY CHEST, ABDOMEN AND PELVIS TECHNIQUE: Non-contrast CT of the chest was initially obtained. Multidetector CT imaging through the chest, abdomen and pelvis was performed using the standard protocol during bolus administration of intravenous contrast. Multiplanar reconstructed images and MIPs were obtained and reviewed to evaluate the vascular anatomy. RADIATION DOSE REDUCTION: This exam was performed according to the departmental dose-optimization program which includes automated exposure control, adjustment of the mA and/or kV according to patient size and/or use of iterative reconstruction technique. CONTRAST:  126m OMNIPAQUE IOHEXOL 350 MG/ML SOLN COMPARISON:  CT dated 12/20/2022. FINDINGS: Evaluation of this exam is limited due to respiratory motion artifact. CTA CHEST FINDINGS Cardiovascular: There is no cardiomegaly or pericardial effusion. There is coronary vascular calcification. Moderate calcified and noncalcified plaque of the thoracic aorta. No aneurysmal dilatation or dissection. The origins of the great vessels of the aortic arch appear patent. No pulmonary artery embolus identified. Mediastinum/Nodes: There is no hilar or mediastinal adenopathy. The esophagus is grossly unremarkable. No mediastinal fluid collection. Lungs/Pleura: Faint scattered nodular ground-glass densities may be artifactual and related to motion. Atypical pneumonia is not excluded clinical correlation recommended. Similar  appearance of several ground-glass nodular density in the posterior right lower lobe dating back to 2020, likely scarring. Attention on follow-up imaging recommended. No consolidative changes. There is no pleural effusion pneumothorax. The central airways are patent. Musculoskeletal: Right-sided Port-A-Cath with tip close to the cavoatrial junction. Degenerative changes of the spine. No acute osseous pathology. Review of the MIP images confirms the above findings. CTA ABDOMEN AND PELVIS FINDINGS VASCULAR Aorta: Advanced atherosclerotic calcification. An infrarenal aorto bi iliac endovascular stent graft noted. The stent appears patent. No aneurysmal dilatation or dissection. No periaortic fluid collection. Celiac: Celiac trunk and its major branches appear patent. SMA: The SMA is patent. Renals: There is atherosclerotic calcification of the renal arteries. The renal arteries patent. IMA: There is poor opacification of the IMA. Inflow: Mild atherosclerotic calcification iliac arteries. The iliac arteries remain patent. No aneurysmal dilatation or dissection. Veins: No obvious venous abnormality within the limitations of this arterial phase study. Review of the MIP images confirms the above findings. NON-VASCULAR No intra-abdominal free air or free fluid. Hepatobiliary: No focal liver abnormality is seen. No gallstones, gallbladder wall thickening, or biliary dilatation. Pancreas: Unremarkable. No pancreatic ductal dilatation or surrounding inflammatory changes. Spleen: Normal in size without focal abnormality. Adrenals/Urinary Tract: The adrenal glands unremarkable. There is a 2 cm right renal inferior pole cyst. There is no hydronephrosis on either side. The visualized ureters and urinary bladder appear unremarkable. Stomach/Bowel: There is sigmoid diverticulosis without active inflammatory changes. There is no bowel obstruction or active inflammation The appendix is not visualized with certainty. No inflammatory  changes identified in the right lower quadrant. Lymphatic: No adenopathy. Reproductive: Hysterectomy. The left ovary is unremarkable. A septated right ovarian cyst measuring up to 9.3 cm, increased since the prior CT. No metabolic activity was demonstrated on PET-CT of 05/23/2022. Because this lesion is not adequately characterized, prompt [place] recommended for further evaluation. Note: This recommendation does not apply to premenarchal  patients and to those with increased risk (genetic, family history, elevated tumor markers or other high-risk factors) of ovarian cancer. Reference: JACR 2020 Feb; 17(2):248-254 Other: None Musculoskeletal: Osteopenia with extensive degenerative changes of the spine. No acute osseous pathology. Review of the MIP images confirms the above findings. IMPRESSION: 1. No acute intrathoracic, abdominal, or pelvic pathology. No aortic aneurysm or dissection. 2. No evidence of metastatic disease in the chest, abdomen, or pelvis. 3. Several ground-glass/part solid nodular density at the right lung base similar prior CT. No associated metabolic activity on the PET CT. Attention on follow-up imaging recommended. 4. Sigmoid diverticulosis without active inflammatory changes. No bowel obstruction. 5. Enlarging septated right ovarian cyst. No metabolic activity was demonstrated on PET-CT of 05/23/2022. Further evaluation with ultrasound is recommended. Electronically Signed   By: Anner Crete M.D.   On: 01/14/2023 18:47     ASSESSMENT & PLAN:   Primary cancer of left lower lobe of lung ([place]) #JUNE 2023-STAGE IV--recurrent/metastatic disease with New left adrenal metastasis;  PET scan JUNE 20th- 2023-  Enlarged 4 cm hypermetabolic LEFT adrenal metastasis;  Suspicion of metastatic adenopathy to the LEFT hilum.  Part solid nodule in the RIGHT lower lobe without metabolic activity. [facility]- PLD1 =80% [primary LUNG mass];  JULY 2023- S/p Biopsy of adrenal nodule- Biopsied POSITIVE for  CK7 positive adenocarcinoma [QNS for NGS]. Currently on single agent Keytruda [PD-L1 greater than 80% ].  Currently on single agent Keytruda.    CT FEB 11th, 2024 [ER]- CT CAP- No acute intrathoracic, abdominal, or pelvic pathology. No aortic aneurysm or dissection;  No evidence of metastatic disease in the chest, abdomen, or pelvis. Several ground-glass/part solid nodular density at the right lung base similar prior CT. see discussion below regarding right ovarian mass.  # Proceed of Keytruda single agent. Labs today reviewed;  acceptable for treatment today. Tolerating well except for mild chills.   # mild Hypokalemia: discussed dietary supplement.   # Chronic pain: headaches/cervical pain/back pain [Dr.Naveira; pain doctor]  On NSAIDs [ monitor for now. OCT 2023- MRI brain- NEGATIVE for any metastatic disease.   Stable.  FEB 2024- MRI lumbar spine- degenerative disease- on ibuprofen prn- BID. stable  # Left rib pain-Post throacotomy pain/tingling and numbness:  Continue gabapentin -300 mg TID; and then at extra at night prn. stable  # CAD/ PVD- cramping- s/p evaluation [Dr.Schneir] dec 2023 stable.   # COPD-stable encouraged continue to avoid smoking; again counseled to quit smoking; recommend evaluation with pulmonary. stable.   #Right mildly complex cystic adnexal mass noted- s/p prior pelvic ultrasound for further work-up [s/p Gyn-Onc; Dr.Berchuck; felt high risk for surgery] 8.1 x 5.8 cm increased from 6.3 by 4.3 cm. Enlarging septated right ovarian cyst. No metabolic activity was demonstrated on PET-CT of 05/23/2022.  Pelvic ultrasound showed increasing size of the ovarian lesion.  Discussed with gyn-Onc.   # IV Access :s/p  port placement- stable.   *AM appts- Monday-   # DISPOSITION: # keytruda today #Follow-up in 3 weeks-X- MD labs/port- cbc/cmp; Keytruda; Dr. B    All questions were answered. The patient knows to call the clinic with any problems, questions or concerns.     Cammie Sickle, MD 02/05/2023 9:59 AM

## 2023-02-12 NOTE — Progress Notes (Unsigned)
[place],acting as a scribe for Lelon Huh, MD.,have documented all relevant documentation on the behalf of Lelon Huh, MD,as directed by  Lelon Huh, MD while in the presence of Lelon Huh, MD.     Established patient visit   Patient: Laura Nielsen   DOB: [birth year]   67 y.o. Female  MRN: [id number] Visit Date: 02/13/2023  Today's healthcare provider: Lelon Huh, MD   No chief complaint on file.  Subjective    HPI  Diabetes Mellitus Type II, Follow-up  Lab Results  Component Value Date   HGBA1C 5.7 (A) 08/14/2022   HGBA1C 5.8 (H) 02/13/2022   HGBA1C 6.1 (H) 07/12/2021   Wt Readings from Last 3 Encounters:  02/05/23 213 lb (96.6 kg)  01/15/23 218 lb (98.9 kg)  12/25/22 217 lb (98.4 kg)   Last seen for diabetes 6 months ago.  Management since then includes ***. She reports {excellent/good/fair/poor:19665} compliance with treatment. She {is/is not:21021397} having side effects. {document side effects if present:1} Symptoms: {Yes/No:20286} fatigue {Yes/No:20286} foot ulcerations  {Yes/No:20286} appetite changes {Yes/No:20286} nausea  {Yes/No:20286} paresthesia of the feet  {Yes/No:20286} polydipsia  {Yes/No:20286} polyuria {Yes/No:20286} visual disturbances   {Yes/No:20286} vomiting     Home blood sugar records: {diabetes glucometry results:16657}  Episodes of hypoglycemia? {Yes/No:20286} {enter symptoms and frequency of symptoms if yes:1}   Current insulin regiment: {enter 'none' or type of insulin and number of units taken with each dose of each insulin formulation that the patient is taking:1} Most Recent Eye Exam: *** {Current exercise:16438:::1} {Current diet habits:16563:::1}  Pertinent Labs: Lab Results  Component Value Date   CHOL 144 02/13/2022   HDL 41 02/13/2022   LDLCALC 76 02/13/2022   TRIG 153 (H) 02/13/2022   CHOLHDL 3.5 02/13/2022   Lab Results  Component Value Date   NA 139 02/05/2023   K 3.4 (L) 02/05/2023    CREATININE 0.72 02/05/2023   GFRNONAA >60 02/05/2023   MICRALBCREAT n/a 12/12/2017     ---------------------------------------------------------------------------------------------------   Medications: Outpatient Medications Prior to Visit  Medication Sig   allopurinol (ZYLOPRIM) 100 MG tablet Take 1 tablet (100 mg total) by mouth daily.   aspirin 81 MG tablet Take 81 mg by mouth daily.    azelastine (ASTELIN) 0.1 % nasal spray Place 2 sprays into both nostrils 2 (two) times daily. Use in each nostril as directed (Patient not taking: Reported on 09/11/2022)   bisoprolol (ZEBETA) 10 MG tablet Take 1 tablet (10 mg total) by mouth daily.   Cholecalciferol 25 MCG (1000 UT) capsule Take 1,000 Units by mouth daily. (Patient not taking: Reported on 01/15/2023)   clopidogrel (PLAVIX) 75 MG tablet Take 1 tablet (75 mg total) by mouth daily.   cyanocobalamin 1000 MCG tablet Take 1,000 mcg by mouth daily. (Patient not taking: Reported on 12/05/2022)   diazepam (VALIUM) 5 MG tablet Take 1 tablet (5 mg total) by mouth every 12 (twelve) hours as needed.   fluticasone (FLONASE) 50 MCG/ACT nasal spray Place into both nostrils daily. (Patient not taking: Reported on 09/11/2022)   gabapentin (NEURONTIN) 300 MG capsule Take 1 capsule (300 mg total) by mouth 3 (three) times daily AND 2 capsules (600 mg total) at bedtime.   HYDROcodone-acetaminophen (NORCO/VICODIN) 5-325 MG tablet Take 1-2 tablets by mouth every 6 (six) hours as needed for moderate pain.   ibuprofen (ADVIL) 200 MG tablet Take 400 mg by mouth 2 (two) times daily as needed (headaches).   lidocaine-prilocaine (EMLA) cream Apply on the port. 30 -45 min  prior to port access.   lisinopril-hydrochlorothiazide (ZESTORETIC) 20-12.5 MG tablet Take 1 tablet by mouth daily.   montelukast (SINGULAIR) 10 MG tablet Take 1 tablet (10 mg total) by mouth at bedtime. (Patient not taking: Reported on 10/23/2022)   OZEMPIC, 1 MG/DOSE, 4 MG/3ML SOPN Inject 1 mg into the  skin once a week.   pantoprazole (PROTONIX) 20 MG tablet Take 1 tablet (20 mg total) by mouth daily.   PROAIR HFA 108 (90 Base) MCG/ACT inhaler Inhale 2 puffs into the lungs every 6 (six) hours as needed for wheezing or shortness of breath.   rosuvastatin (CRESTOR) 20 MG tablet Take 1 tablet (20 mg total) by mouth daily.   Facility-Administered Medications Prior to Visit  Medication Dose Route Frequency Provider   heparin lock flush 100 UNIT/ML injection         Review of Systems  {Labs  Heme  Chem  Endocrine  Serology  Results Review (optional):23779}   Objective    There were no vitals taken for this visit. {Show previous vital signs (optional):23777}  Physical Exam  ***  No results found for any visits on 02/13/23.  Assessment & Plan     ***  No follow-ups on file.      {provider attestation***:1}   Lelon Huh, MD  [place] [phone] (phone) [phone] (fax)  [place]

## 2023-02-13 ENCOUNTER — Ambulatory Visit (INDEPENDENT_AMBULATORY_CARE_PROVIDER_SITE_OTHER): Payer: Medicare Other | Admitting: Family Medicine

## 2023-02-13 VITALS — BP 118/70 | HR 66 | Temp 98.4°F | Wt 214.0 lb

## 2023-02-13 DIAGNOSIS — E538 Deficiency of other specified B group vitamins: Secondary | ICD-10-CM | POA: Diagnosis not present

## 2023-02-13 DIAGNOSIS — J449 Chronic obstructive pulmonary disease, unspecified: Secondary | ICD-10-CM

## 2023-02-13 DIAGNOSIS — E119 Type 2 diabetes mellitus without complications: Secondary | ICD-10-CM | POA: Diagnosis not present

## 2023-02-13 DIAGNOSIS — E559 Vitamin D deficiency, unspecified: Secondary | ICD-10-CM | POA: Diagnosis not present

## 2023-02-13 DIAGNOSIS — E7800 Pure hypercholesterolemia, unspecified: Secondary | ICD-10-CM

## 2023-02-13 LAB — POCT GLYCOSYLATED HEMOGLOBIN (HGB A1C)
Est. average glucose Bld gHb Est-mCnc: 123
Hemoglobin A1C: 5.9 % — AB (ref 4.0–5.6)

## 2023-02-13 NOTE — Patient Instructions (Signed)
.   Please review the attached list of medications and notify my office if there are any errors.   . Please bring all of your medications to every appointment so we can make sure that our medication list is the same as yours.   

## 2023-02-14 LAB — LIPID PANEL
Chol/HDL Ratio: 3.6 ratio (ref 0.0–4.4)
Cholesterol, Total: 150 mg/dL (ref 100–199)
HDL: 42 mg/dL (ref >39)
LDL Chol Calc (NIH): 82 mg/dL (ref 0–99)
Triglycerides: 150 mg/dL — ABNORMAL HIGH (ref 0–149)
VLDL Cholesterol Cal: 26 mg/dL (ref 5–40)

## 2023-02-14 LAB — VITAMIN D 25 HYDROXY (VIT D DEFICIENCY, FRACTURES): Vit D, 25-Hydroxy: 31.7 ng/mL (ref 30.0–100.0)

## 2023-02-14 LAB — VITAMIN B12: Vitamin B-12: 308 pg/mL (ref 232–1245)

## 2023-02-20 ENCOUNTER — Ambulatory Visit (INDEPENDENT_AMBULATORY_CARE_PROVIDER_SITE_OTHER): Payer: Medicare Other

## 2023-02-20 VITALS — Ht 68.0 in | Wt 214.0 lb

## 2023-02-20 DIAGNOSIS — Z0000 Encounter for general adult medical examination without abnormal findings: Secondary | ICD-10-CM | POA: Diagnosis not present

## 2023-02-20 NOTE — Patient Instructions (Signed)
[name] , Thank you for taking time to come for your Medicare Wellness Visit. I appreciate your ongoing commitment to your health goals. Please review the following plan we discussed and let me know if I can assist you in the future.   These are the goals we discussed:  Goals      Have 3 meals a day     Recommend to decrease portion sizes by eating 3 small healthy meals and at least 2 healthy snacks per day.     Quit Smoking     Recommend to continue efforts to reduce smoking habits until no longer smoking.         This is a list of the screening recommended for you and due dates:  Health Maintenance  Topic Date Due   Zoster (Shingles) Vaccine (1 of 2) Never done   Yearly kidney health urinalysis for diabetes  12/12/2018   Colon Cancer Screening  01/05/2022   COVID-19 Vaccine (4 - 2023-24 season) 08/04/2022   Flu Shot  03/04/2023*   Hemoglobin A1C  08/16/2023   Eye exam for diabetics  09/09/2023   Yearly kidney function blood test for diabetes  02/05/2024   Medicare Annual Wellness Visit  02/20/2024   Mammogram  03/03/2024   DEXA scan (bone density measurement)  04/06/2027   DTaP/Tdap/Td vaccine (2 - Td or Tdap) 08/31/2027   Pneumonia Vaccine  Completed   Hepatitis C Screening: USPSTF Recommendation to screen - Ages 66-79 yo.  Completed   HPV Vaccine  Aged Out   Cologuard (Stool DNA test)  Discontinued  *Topic was postponed. The date shown is not the original due date.    Advanced directives: no  Conditions/risks identified: falls risk  Next appointment: Follow up in one year for your annual wellness visit 02/20/2024 @0845  telephone   Preventive Care 66 Years and Older, Female Preventive care refers to lifestyle choices and visits with your health care provider that can promote health and wellness. What does preventive care include? A yearly physical exam. This is also called an annual well check. Dental exams once or twice a year. Routine eye exams. Ask your  health care provider how often you should have your eyes checked. Personal lifestyle choices, including: Daily care of your teeth and gums. Regular physical activity. Eating a healthy diet. Avoiding tobacco and drug use. Limiting alcohol use. Practicing safe sex. Taking low-dose aspirin every day. Taking vitamin and mineral supplements as recommended by your health care provider. What happens during an annual well check? The services and screenings done by your health care provider during your annual well check will depend on your age, overall health, lifestyle risk factors, and family history of disease. Counseling  Your health care provider may ask you questions about your: Alcohol use. Tobacco use. Drug use. Emotional well-being. Home and relationship well-being. Sexual activity. Eating habits. History of falls. Memory and ability to understand (cognition). Work and work Statistician. Reproductive health. Screening  You may have the following tests or measurements: Height, weight, and BMI. Blood pressure. Lipid and cholesterol levels. These may be checked every 5 years, or more frequently if you are over 27 years old. Skin check. Lung cancer screening. You may have this screening every year starting at age 66 if you have a 30-pack-year history of smoking and currently smoke or have quit within the past 15 years. Fecal occult blood test (FOBT) of the stool. You may have this test every year starting at age 66. Flexible sigmoidoscopy or  colonoscopy. You may have a sigmoidoscopy every 5 years or a colonoscopy every 10 years starting at age 66. Hepatitis C blood test. Hepatitis B blood test. Sexually transmitted disease (STD) testing. Diabetes screening. This is done by checking your blood sugar (glucose) after you have not eaten for a while (fasting). You may have this done every 1-3 years. Bone density scan. This is done to screen for osteoporosis. You may have this done  starting at age 66. Mammogram. This may be done every 1-2 years. Talk to your health care provider about how often you should have regular mammograms. Talk with your health care provider about your test results, treatment options, and if necessary, the need for more tests. Vaccines  Your health care provider may recommend certain vaccines, such as: Influenza vaccine. This is recommended every year. Tetanus, diphtheria, and acellular pertussis (Tdap, Td) vaccine. You may need a Td booster every 10 years. Zoster vaccine. You may need this after age 66. Pneumococcal 13-valent conjugate (PCV13) vaccine. One dose is recommended after age 66. Pneumococcal polysaccharide (PPSV23) vaccine. One dose is recommended after age 75. Talk to your health care provider about which screenings and vaccines you need and how often you need them. This information is not intended to replace advice given to you by your health care provider. Make sure you discuss any questions you have with your health care provider. Document Released: 12/17/2015 Document Revised: 08/09/2016 Document Reviewed: 09/21/2015 Elsevier Interactive Patient Education  2017 [employer] Prevention in the Home Falls can cause injuries. They can happen to people of all ages. There are many things you can do to make your home safe and to help prevent falls. What can I do on the outside of my home? Regularly fix the edges of walkways and driveways and fix any cracks. Remove anything that might make you trip as you walk through a door, such as a raised step or threshold. Trim any bushes or trees on the path to your home. Use bright outdoor lighting. Clear any walking paths of anything that might make someone trip, such as rocks or tools. Regularly check to see if handrails are loose or broken. Make sure that both sides of any steps have handrails. Any raised decks and porches should have guardrails on the edges. Have any leaves, snow, or  ice cleared regularly. Use sand or salt on walking paths during winter. Clean up any spills in your garage right away. This includes oil or grease spills. What can I do in the bathroom? Use night lights. Install grab bars by the toilet and in the tub and shower. Do not use towel bars as grab bars. Use non-skid mats or decals in the tub or shower. If you need to sit down in the shower, use a plastic, non-slip stool. Keep the floor dry. Clean up any water that spills on the floor as soon as it happens. Remove soap buildup in the tub or shower regularly. Attach bath mats securely with double-sided non-slip rug tape. Do not have throw rugs and other things on the floor that can make you trip. What can I do in the bedroom? Use night lights. Make sure that you have a light by your bed that is easy to reach. Do not use any sheets or blankets that are too big for your bed. They should not hang down onto the floor. Have a firm chair that has side arms. You can use this for support while you get dressed. Do not  have throw rugs and other things on the floor that can make you trip. What can I do in the kitchen? Clean up any spills right away. Avoid walking on wet floors. Keep items that you use a lot in easy-to-reach places. If you need to reach something above you, use a strong step stool that has a grab bar. Keep electrical cords out of the way. Do not use floor polish or wax that makes floors slippery. If you must use wax, use non-skid floor wax. Do not have throw rugs and other things on the floor that can make you trip. What can I do with my stairs? Do not leave any items on the stairs. Make sure that there are handrails on both sides of the stairs and use them. Fix handrails that are broken or loose. Make sure that handrails are as long as the stairways. Check any carpeting to make sure that it is firmly attached to the stairs. Fix any carpet that is loose or worn. Avoid having throw rugs at  the top or bottom of the stairs. If you do have throw rugs, attach them to the floor with carpet tape. Make sure that you have a light switch at the top of the stairs and the bottom of the stairs. If you do not have them, ask someone to add them for you. What else can I do to help prevent falls? Wear shoes that: Do not have high heels. Have rubber bottoms. Are comfortable and fit you well. Are closed at the toe. Do not wear sandals. If you use a stepladder: Make sure that it is fully opened. Do not climb a closed stepladder. Make sure that both sides of the stepladder are locked into place. Ask someone to hold it for you, if possible. Clearly mark and make sure that you can see: Any grab bars or handrails. First and last steps. Where the edge of each step is. Use tools that help you move around (mobility aids) if they are needed. These include: Canes. Walkers. Scooters. Crutches. Turn on the lights when you go into a dark area. Replace any light bulbs as soon as they burn out. Set up your furniture so you have a clear path. Avoid moving your furniture around. If any of your floors are uneven, fix them. If there are any pets around you, be aware of where they are. Review your medicines with your doctor. Some medicines can make you feel dizzy. This can increase your chance of falling. Ask your doctor what other things that you can do to help prevent falls. This information is not intended to replace advice given to you by your health care provider. Make sure you discuss any questions you have with your health care provider. Document Released: 09/16/2009 Document Revised: 04/27/2016 Document Reviewed: 12/25/2014 Elsevier Interactive Patient Education  2017 [employer].

## 2023-02-20 NOTE — Progress Notes (Signed)
I connected with  [name] on 02/20/23 by a audio enabled telemedicine application and verified that I am speaking with the correct person using two identifiers.  Patient Location: Home  Provider Location: Office/Clinic  I discussed the limitations of evaluation and management by telemedicine. The patient expressed understanding and agreed to proceed.  Subjective:   Laura Nielsen is a 67 y.o. female who presents for Medicare Annual (Subsequent) preventive examination.  Review of Systems          Objective:    Today's Vitals   02/20/23 0847  Weight: 214 lb (97.1 kg)  Height: 5\' 8"  (1.727 m)   Body mass index is 32.54 kg/m.     02/20/2023    9:03 AM 02/05/2023    9:01 AM 01/15/2023    9:18 AM 12/25/2022   11:39 AM 12/25/2022   10:48 AM 12/05/2022    8:39 AM 11/13/2022   10:34 AM  Advanced Directives  Does Patient Have a Medical Advance Directive? No No No No No No No  Would patient like information on creating a medical advance directive?  No - Patient declined No - Patient declined No - Patient declined No - Patient declined No - Patient declined     Current Medications (verified) Outpatient Encounter Medications as of 02/20/2023  Medication Sig   allopurinol (ZYLOPRIM) 100 MG tablet Take 1 tablet (100 mg total) by mouth daily.   aspirin 81 MG tablet Take 81 mg by mouth daily.    azelastine (ASTELIN) 0.1 % nasal spray Place 2 sprays into both nostrils 2 (two) times daily. Use in each nostril as directed (Patient not taking: Reported on 09/11/2022)   bisoprolol (ZEBETA) 10 MG tablet Take 1 tablet (10 mg total) by mouth daily.   Cholecalciferol 25 MCG (1000 UT) capsule Take 1,000 Units by mouth daily. (Patient not taking: Reported on 01/15/2023)   clopidogrel (PLAVIX) 75 MG tablet Take 1 tablet (75 mg total) by mouth daily.   cyanocobalamin 1000 MCG tablet Take 1,000 mcg by mouth daily. (Patient not taking: Reported on 12/05/2022)   diazepam (VALIUM) 5 MG tablet Take 1 tablet (5 mg  total) by mouth every 12 (twelve) hours as needed.   fluticasone (FLONASE) 50 MCG/ACT nasal spray Place into both nostrils daily. (Patient not taking: Reported on 09/11/2022)   gabapentin (NEURONTIN) 300 MG capsule Take 1 capsule (300 mg total) by mouth 3 (three) times daily AND 2 capsules (600 mg total) at bedtime.   HYDROcodone-acetaminophen (NORCO/VICODIN) 5-325 MG tablet Take 1-2 tablets by mouth every 6 (six) hours as needed for moderate pain.   ibuprofen (ADVIL) 200 MG tablet Take 400 mg by mouth 2 (two) times daily as needed (headaches).   lidocaine-prilocaine (EMLA) cream Apply on the port. 30 -45 min  prior to port access.   lisinopril-hydrochlorothiazide (ZESTORETIC) 20-12.5 MG tablet Take 1 tablet by mouth daily.   montelukast (SINGULAIR) 10 MG tablet Take 1 tablet (10 mg total) by mouth at bedtime. (Patient not taking: Reported on 10/23/2022)   OZEMPIC, 1 MG/DOSE, 4 MG/3ML SOPN Inject 1 mg into the skin once a week.   pantoprazole (PROTONIX) 20 MG tablet Take 1 tablet (20 mg total) by mouth daily.   PROAIR HFA 108 (90 Base) MCG/ACT inhaler Inhale 2 puffs into the lungs every 6 (six) hours as needed for wheezing or shortness of breath.   rosuvastatin (CRESTOR) 20 MG tablet Take 1 tablet (20 mg total) by mouth daily.   Facility-Administered Encounter Medications as of 02/20/2023  Medication   heparin lock flush 100 UNIT/ML injection    Allergies (verified) Atorvastatin, Omeprazole, and Bupropion   History: Past Medical History:  Diagnosis Date   AAA (abdominal aortic aneurysm) (HCC)    s/p EVAR AAA 03/27/13   Arthritis    Asthma    Cancer ([place])    Complication of anesthesia    BP drops after   History of kidney stones    Osteoporosis    Sleep apnea    Past Surgical History:  Procedure Laterality Date   ABDOMINAL AORTIC ENDOVASCULAR STENT GRAFT  03/27/2013   Dr. Hortencia Pilar   Cardiac catheterization  02/2009   70-80% stenosis RCA stent placed. started on Plavix    CARDIAC CATHETERIZATION     COLONOSCOPY WITH PROPOFOL N/A 01/05/2021   Procedure: COLONOSCOPY WITH PROPOFOL;  Surgeon: Virgel Manifold, MD;  Location: [facility] ENDOSCOPY;  Service: Endoscopy;  Laterality: N/A;   CORONARY ANGIOPLASTY     stent placed   ESOPHAGOGASTRODUODENOSCOPY (EGD) WITH PROPOFOL N/A 01/05/2021   Procedure: ESOPHAGOGASTRODUODENOSCOPY (EGD) WITH PROPOFOL;  Surgeon: Virgel Manifold, MD;  Location: [facility] ENDOSCOPY;  Service: Endoscopy;  Laterality: N/A;   INTERCOSTAL NERVE BLOCK Left 04/06/2021   Procedure: INTERCOSTAL NERVE BLOCK;  Surgeon: Melrose Nakayama, MD;  Location: [place];  Service: Thoracic;  Laterality: Left;   IR IMAGING GUIDED PORT INSERTION  06/20/2022   KIDNEY STONE SURGERY  1999   LUNG LOBECTOMY Left 04/06/2021   robotic left lower lobectomy 04/06/2021 Dr. Roxan Hockey for Stage 1A adenocarcinoma   NODE DISSECTION Left 04/06/2021   Procedure: NODE DISSECTION;  Surgeon: Melrose Nakayama, MD;  Location: [place];  Service: Thoracic;  Laterality: Left;   TUBAL LIGATION     VAGINAL HYSTERECTOMY     Menometrorrhagia. Excessive bleeding. Unknown if cervix removed.    Family History  Problem Relation Age of Onset   Hypertension Mother    Coronary artery disease Mother    Heart attack Mother        acute   Cancer Mother    Alcohol abuse Father    Depression Father    Hypertension Father    Heart attack Father 67       acute   Alcohol abuse Sister    Hyperlipidemia Sister    Hypertension Sister    Cancer Sister 36   Heart attack Sister        x's 2   Coronary artery disease Sister 73       x's 2   Breast cancer Sister 67   Social History   Socioeconomic History   Marital status: Divorced    Spouse name: Not on file   Number of children: 2   Years of education: H/S   Highest education level: High school graduate  Occupational History   Occupation: Disabled   Occupation: retired  Tobacco Use   Smoking status: Former    Packs/day: 0.25     Years: 44.50    Additional pack years: 0.00    Total pack years: 11.13    Types: Cigarettes    Quit date: 04/05/2021    Years since quitting: 1.8   Smokeless tobacco: Never   Tobacco comments:    12/23/19 states she quit for 3 months and then started back. Pt is going to talk with MD about this.  Vaping Use   Vaping Use: Never used  Substance and Sexual Activity   Alcohol use: Yes    Comment: rarely - once a year  Drug use: No   Sexual activity: Not on file  Other Topics Concern   Not on file  Social History Narrative   Hx of smoking; quit prior to lung surgery. Lives in [place]- self. No alcohol. 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 On disability sec to spinal pain.    Social Determinants of Health   Financial Resource Strain: Medium Risk (02/16/2023)   Overall Financial Resource Strain (CARDIA)    Difficulty of Paying Living Expenses: Somewhat hard  Food Insecurity: Patient Declined (02/16/2023)   Hunger Vital Sign    Worried About Running Out of Food in the Last Year: Patient declined    [place] in the Last Year: Patient declined  Transportation Needs: No Transportation Needs (02/16/2023)   PRAPARE - Hydrologist (Medical): No    Lack of Transportation (Non-Medical): No  Physical Activity: Unknown (02/16/2023)   Exercise Vital Sign    Days of Exercise per Week: 0 days    Minutes of Exercise per Session: Patient declined  Stress: Stress Concern Present (02/16/2023)   [place]    Feeling of Stress : Rather much  Social Connections: Unknown (02/16/2023)   Social Connection and Isolation Panel [NHANES]    Frequency of Communication with Friends and Family: More than three times a week    Frequency of Social Gatherings with Friends and Family: Once a week    Attends Religious Services: Not on Diplomatic Services operational officer of Clubs or Organizations: No    Attends Theatre manager Meetings: Never    Marital Status: Patient declined    Tobacco Counseling Counseling given: Not Answered Tobacco comments: 12/23/19 states she quit for 3 months and then started back. Pt is going to talk with MD about this.   Clinical Intake:              How often do you need to have someone help you when you read instructions, pamphlets, or other written materials from your doctor or pharmacy?: 2 - Rarely  Diabetic?yes         Activities of Daily Living    02/16/2023    9:08 AM 02/13/2023    8:47 AM  In your present state of health, do you have any difficulty performing the following activities:  Hearing? 1 1  Vision? 1 1  Difficulty concentrating or making decisions? 1 1  Walking or climbing stairs? 1 1  Dressing or bathing? 0 0  Doing errands, shopping? 0 0  Preparing Food and eating ? N   Using the Toilet? N   In the past six months, have you accidently leaked urine? Y   Do you have problems with loss of bowel control? N   Managing your Medications? N   Managing your Finances? N   Housekeeping or managing your Housekeeping? N     Patient Care Team: Birdie Sons, MD as PCP - General (Family Medicine) Ubaldo Glassing, Javier Docker, MD as Consulting Physician (Cardiology) Schnier, Dolores Lory, MD (Vascular Surgery) Vladimir Crofts, MD as Consulting Physician (Neurology) Milinda Pointer, MD as Referring Physician (Pain Medicine) Pa, [facility]) Ubaldo Glassing, Javier Docker, MD as Consulting Physician (Cardiology) Virgel Manifold, MD (Inactive) as Consulting Physician (Gastroenterology) Gae Dry, MD as Referring Physician (Obstetrics and Gynecology) Telford Nab, RN as Oncology Nurse Navigator Cammie Sickle, MD as Consulting Physician (Oncology)  Indicate any recent Medical Services you  may have received from other than [facility] providers in the past year (date may be approximate).     Assessment:   This is a routine wellness  examination for [place].  Hearing/Vision screen Hearing Screening - Comments:: Adequate hearing Vision Screening - Comments:: Adequate vision ;just getting/needing glasses [facility]  Dietary issues and exercise activities discussed:     Goals Addressed   None    Depression Screen    02/20/2023    8:55 AM 02/13/2023    8:46 AM 08/14/2022    9:54 AM 03/21/2022    4:09 PM 02/13/2022    9:13 AM 12/29/2021    3:23 PM 07/12/2021   10:58 AM  PHQ 2/9 Scores  PHQ - 2 Score 3 3 3 1 4 2 2   PHQ- 9 Score 11 11 11  11 9 12     Fall Risk    02/16/2023    9:08 AM 02/13/2023    8:46 AM 08/14/2022    9:54 AM 03/21/2022   11:27 AM 02/13/2022    9:14 AM  Fall Risk   Falls in the past year? 0 0 0 0 0  Number falls in past yr: 0 0  0 0  Injury with Fall?  0  0 0  Risk for fall due to :    Medication side effect No Fall Risks  Follow up    Falls prevention discussed Falls evaluation completed    FALL RISK PREVENTION PERTAINING TO THE HOME:  Any stairs in or around the home? Yes  If so, are there any without handrails? Yes  Home free of loose throw rugs in walkways, pet beds, electrical cords, etc? Yes  Adequate lighting in your home to reduce risk of falls? Yes   ASSISTIVE DEVICES UTILIZED TO PREVENT FALLS:  Life alert? No  Use of a cane, walker or w/c? No  Grab bars in the bathroom? No  Shower chair or bench in shower? Yes does not use now Elevated toilet seat or a handicapped toilet? No    Cognitive Function:        02/20/2023    9:03 AM  6CIT Screen  What Year? 0 points  What month? 0 points  What time? 0 points  Count back from 20 0 points  Months in reverse 0 points  Repeat phrase 2 points  Total Score 2 points    Immunizations Immunization History  Administered Date(s) Administered   Influenza,inj,Quad PF,6+ Mos 09/03/2020   Influenza-Unspecified 10/10/2021   PFIZER(Purple Top)SARS-COV-2 Vaccination 02/20/2020, 03/19/2020, 11/30/2020   PNEUMOCOCCAL CONJUGATE-20  02/13/2022   Pneumococcal Polysaccharide-23 09/25/2016   Tdap 08/30/2017    TDAP status: Up to date  Flu Vaccine status: Declined, Education has been provided regarding the importance of this vaccine but patient still declined. Advised may receive this vaccine at local pharmacy or Health Dept. Aware to provide a copy of the vaccination record if obtained from local pharmacy or Health Dept. Verbalized acceptance and understanding.  Pneumococcal vaccine status: Up to date  Covid-19 vaccine status: Completed vaccines  Qualifies for Shingles Vaccine? Yes   Zostavax completed No   Shingrix Completed?: No.    Education has been provided regarding the importance of this vaccine. Patient has been advised to call insurance company to determine out of pocket expense if they have not yet received this vaccine. Advised may also receive vaccine at local pharmacy or Health Dept. Verbalized acceptance and understanding.  Screening Tests Health Maintenance  Topic Date Due   Zoster Vaccines-  Shingrix (1 of 2) Never done   Diabetic kidney evaluation - Urine ACR  12/12/2018   COLONOSCOPY (Pts 45-23yrs Insurance coverage will need to be confirmed)  01/05/2022   COVID-19 Vaccine (4 - 2023-24 season) 08/04/2022   INFLUENZA VACCINE  03/04/2023 (Originally 07/04/2022)   HEMOGLOBIN A1C  08/16/2023   OPHTHALMOLOGY EXAM  09/09/2023   Diabetic kidney evaluation - eGFR measurement  02/05/2024   Medicare Annual Wellness (AWV)  02/20/2024   MAMMOGRAM  03/03/2024   DEXA SCAN  04/06/2027   DTaP/Tdap/Td (2 - Td or Tdap) 08/31/2027   Pneumonia Vaccine 89+ Years old  Completed   Hepatitis C Screening  Completed   HPV VACCINES  Aged Out   Fecal DNA (Cologuard)  Discontinued    Health Maintenance  Health Maintenance Due  Topic Date Due   Zoster Vaccines- Shingrix (1 of 2) Never done   Diabetic kidney evaluation - Urine ACR  12/12/2018   COLONOSCOPY (Pts 45-41yrs Insurance coverage will need to be confirmed)   01/05/2022   COVID-19 Vaccine (4 - 2023-24 season) 08/04/2022    Colorectal cancer screening: Type of screening: Colonoscopy. Completed yes. Repeat every 5 years  Mammogram status: Ordered yes. Pt provided with contact info and advised to call to schedule appt.  Pt says already scheduled  Bone Density status: Completed yes. Results reflect: Bone density results: NORMAL. Repeat every 5 years.  Lung Cancer Screening: (Low Dose CT Chest recommended if Age 58-80 years, 30 pack-year currently smoking OR have quit w/in 15years.) does qualify.   Lung Cancer Screening Referral: no has had  Additional Screening:  Hepatitis C Screening: does not qualify; Completed yes  Vision Screening: Recommended annual ophthalmology exams for early detection of glaucoma and other disorders of the eye. Is the patient up to date with their annual eye exam?  Yes  Who is the provider or what is the name of the office in which the patient attends annual eye exams? [place] If pt is not established with a provider, would they like to be referred to a provider to establish care? No .   Dental Screening: Recommended annual dental exams for proper oral hygiene  Community Resource Referral / Chronic Care Management: CRR required this visit?  No   CCM required this visit?  No      Plan:     I have personally reviewed and noted the following in the patient's chart:   Medical and social history Use of alcohol, tobacco or illicit drugs  Current medications and supplements including opioid prescriptions. Patient is currently taking opioid prescriptions. Information provided to patient regarding non-opioid alternatives. Patient advised to discuss non-opioid treatment plan with their provider. Functional ability and status Nutritional status Physical activity Advanced directives List of other physicians Hospitalizations, surgeries, and ER visits in previous 12 months Vitals Screenings to include cognitive,  depression, and falls Referrals and appointments  In addition, I have reviewed and discussed with patient certain preventive protocols, quality metrics, and best practice recommendations. A written personalized care plan for preventive services as well as general preventive health recommendations were provided to patient.     Roger Shelter, LPN   [id number]   Nurse Notes: pt sts she is trying to cope with her lung cancer returning. She reports being in generalized pain all the time and unstable on her feet as she experiences dizziness frequently. Pt has no questions or concerns for this visit.

## 2023-02-26 ENCOUNTER — Other Ambulatory Visit: Payer: Medicare Other

## 2023-02-26 ENCOUNTER — Inpatient Hospital Stay (HOSPITAL_BASED_OUTPATIENT_CLINIC_OR_DEPARTMENT_OTHER): Payer: Medicare Other | Admitting: Internal Medicine

## 2023-02-26 ENCOUNTER — Inpatient Hospital Stay: Payer: Medicare Other

## 2023-02-26 ENCOUNTER — Ambulatory Visit: Payer: Medicare Other

## 2023-02-26 ENCOUNTER — Encounter: Payer: Self-pay | Admitting: Internal Medicine

## 2023-02-26 ENCOUNTER — Ambulatory Visit: Payer: Medicare Other | Admitting: Internal Medicine

## 2023-02-26 VITALS — BP 96/67 | HR 79 | Temp 98.0°F | Resp 18 | Wt 211.0 lb

## 2023-02-26 DIAGNOSIS — Z5112 Encounter for antineoplastic immunotherapy: Secondary | ICD-10-CM | POA: Diagnosis not present

## 2023-02-26 DIAGNOSIS — E876 Hypokalemia: Secondary | ICD-10-CM | POA: Diagnosis not present

## 2023-02-26 DIAGNOSIS — Z79620 Long term (current) use of immunosuppressive biologic: Secondary | ICD-10-CM | POA: Diagnosis not present

## 2023-02-26 DIAGNOSIS — Z79899 Other long term (current) drug therapy: Secondary | ICD-10-CM | POA: Diagnosis not present

## 2023-02-26 DIAGNOSIS — C3432 Malignant neoplasm of lower lobe, left bronchus or lung: Secondary | ICD-10-CM | POA: Diagnosis not present

## 2023-02-26 DIAGNOSIS — C7972 Secondary malignant neoplasm of left adrenal gland: Secondary | ICD-10-CM | POA: Diagnosis not present

## 2023-02-26 LAB — COMPREHENSIVE METABOLIC PANEL
ALT: 17 U/L (ref 0–44)
AST: 26 U/L (ref 15–41)
Albumin: 3.9 g/dL (ref 3.5–5.0)
Alkaline Phosphatase: 98 U/L (ref 38–126)
Anion gap: 8 (ref 5–15)
BUN: 19 mg/dL (ref 8–23)
CO2: 22 mmol/L (ref 22–32)
Calcium: 9.1 mg/dL (ref 8.9–10.3)
Chloride: 103 mmol/L (ref 98–111)
Creatinine, Ser: 0.65 mg/dL (ref 0.44–1.00)
GFR, Estimated: >60 mL/min (ref >60)
Glucose, Bld: 106 mg/dL — ABNORMAL HIGH (ref 70–99)
Potassium: 3.3 mmol/L — ABNORMAL LOW (ref 3.5–5.1)
Sodium: 133 mmol/L — ABNORMAL LOW (ref 135–145)
Total Bilirubin: 0.8 mg/dL (ref 0.3–1.2)
Total Protein: 7.2 g/dL (ref 6.5–8.1)

## 2023-02-26 LAB — CBC WITH DIFFERENTIAL/PLATELET
Abs Immature Granulocytes: 0.02 10*3/uL (ref 0.00–0.07)
Basophils Absolute: 0.1 10*3/uL (ref 0.0–0.1)
Basophils Relative: 1 %
Eosinophils Absolute: 0.1 10*3/uL (ref 0.0–0.5)
Eosinophils Relative: 1 %
HCT: 45 % (ref 36.0–46.0)
Hemoglobin: 15.1 g/dL — ABNORMAL HIGH (ref 12.0–15.0)
Immature Granulocytes: 0 %
Lymphocytes Relative: 17 %
Lymphs Abs: 1 10*3/uL (ref 0.7–4.0)
MCH: 28.4 pg (ref 26.0–34.0)
MCHC: 33.6 g/dL (ref 30.0–36.0)
MCV: 84.6 fL (ref 80.0–100.0)
Monocytes Absolute: 0.6 10*3/uL (ref 0.1–1.0)
Monocytes Relative: 10 %
Neutro Abs: 4.3 10*3/uL (ref 1.7–7.7)
Neutrophils Relative %: 71 %
Platelets: 201 10*3/uL (ref 150–400)
RBC: 5.32 MIL/uL — ABNORMAL HIGH (ref 3.87–5.11)
RDW: 13.5 % (ref 11.5–15.5)
WBC: 6 10*3/uL (ref 4.0–10.5)
nRBC: 0 % (ref 0.0–0.2)

## 2023-02-26 LAB — TSH: TSH: 1.303 u[IU]/mL (ref 0.350–4.500)

## 2023-02-26 MED ORDER — HEPARIN SOD (PORK) LOCK FLUSH 100 UNIT/ML IV SOLN
INTRAVENOUS | Status: AC
  Administered 2023-02-26: 500 [IU]
  Filled 2023-02-26: qty 5

## 2023-02-26 MED ORDER — SODIUM CHLORIDE 0.9 % IV SOLN
Freq: Once | INTRAVENOUS | Status: AC
  Filled 2023-02-26: qty 250

## 2023-02-26 MED ORDER — POTASSIUM CHLORIDE CRYS ER 20 MEQ PO TBCR: 20.0000 meq | EXTENDED_RELEASE_TABLET | Freq: Every day | ORAL | 0 refills | Status: DC

## 2023-02-26 MED ORDER — HEPARIN SOD (PORK) LOCK FLUSH 100 UNIT/ML IV SOLN
500.0000 [IU] | Freq: Once | INTRAVENOUS | Status: AC | PRN
  Filled 2023-02-26: qty 5

## 2023-02-26 MED ORDER — SODIUM CHLORIDE 0.9 % IV SOLN
200.0000 mg | Freq: Once | INTRAVENOUS | Status: AC
  Administered 2023-02-26: 200 mg via INTRAVENOUS
  Filled 2023-02-26: qty 8

## 2023-02-26 NOTE — Progress Notes (Signed)
Pt didn't sleep well last night and has been awake since 4am. Periods of hot/cold. She is also having trouble having bowel movements, she is at 1 a week.

## 2023-02-26 NOTE — Progress Notes (Signed)
Keytruda diluted in NS48mL, Exp:  07/03/24, Lot:  [id number]  Acquanetta Belling, RPH, BCPS, BCOP 02/26/2023 12:20 PM

## 2023-02-26 NOTE — Patient Instructions (Signed)
[facility] [facility] [place] [facility]  Discharge Instructions: Thank you for choosing [facility] to provide your oncology and hematology care.  If you have a lab appointment with the Cancer Center, please go directly to the Cancer Center and check in at the registration area.  Wear comfortable clothing and clothing appropriate for easy access to any Portacath or PICC line.   We strive to give you quality time with your provider. You may need to reschedule your appointment if you arrive late (15 or more minutes).  Arriving late affects you and other patients whose appointments are after yours.  Also, if you miss three or more appointments without notifying the office, you may be dismissed from the clinic at the provider's discretion.      For prescription refill requests, have your pharmacy contact our office and allow 72 hours for refills to be completed.    Today you received the following chemotherapy and/or immunotherapy agents- Keytruda      To help prevent nausea and vomiting after your treatment, we encourage you to take your nausea medication as directed.  BELOW ARE SYMPTOMS THAT SHOULD BE REPORTED IMMEDIATELY: *FEVER GREATER THAN 100.4 F (38 C) OR HIGHER *CHILLS OR SWEATING *NAUSEA AND VOMITING THAT IS NOT CONTROLLED WITH YOUR NAUSEA MEDICATION *UNUSUAL SHORTNESS OF BREATH *UNUSUAL BRUISING OR BLEEDING *URINARY PROBLEMS (pain or burning when urinating, or frequent urination) *BOWEL PROBLEMS (unusual diarrhea, constipation, pain near the anus) TENDERNESS IN MOUTH AND THROAT WITH OR WITHOUT PRESENCE OF ULCERS (sore throat, sores in mouth, or a toothache) UNUSUAL RASH, SWELLING OR PAIN  UNUSUAL VAGINAL DISCHARGE OR ITCHING   Items with * indicate a potential emergency and should be followed up as soon as possible or go to the Emergency Department if any problems should occur.  Please show the CHEMOTHERAPY ALERT CARD or IMMUNOTHERAPY ALERT CARD at check-in to  the Emergency Department and triage nurse.  Should you have questions after your visit or need to cancel or reschedule your appointment, please contact [facility] [place] [facility]  [phone] and follow the prompts.  Office hours are 8:00 a.m. to 4:30 p.m. Monday - Friday. Please note that voicemails left after 4:00 p.m. may not be returned until the following business day.  We are closed weekends and major holidays. You have access to a nurse at all times for urgent questions. Please call the main number to the clinic [phone] and follow the prompts.  For any non-urgent questions, you may also contact your provider using MyChart. We now offer e-Visits for anyone 18 and older to request care online for non-urgent symptoms. For details visit [email or web address].   Also download the MyChart app! Go to the app store, search "MyChart", open the app, select [facility], and log in with your MyChart username and password.   

## 2023-02-26 NOTE — Progress Notes (Signed)
[place] NOTE  Patient Care Team: Birdie Sons, MD as PCP - General (Family Medicine) Ubaldo Glassing Javier Docker, MD as Consulting Physician (Cardiology) Schnier, Dolores Lory, MD (Vascular Surgery) Vladimir Crofts, MD as Consulting Physician (Neurology) Milinda Pointer, MD as Referring Physician (Pain Medicine) Pa, [place] (Optometry) Ubaldo Glassing, Javier Docker, MD as Consulting Physician (Cardiology) Virgel Manifold, MD (Inactive) as Consulting Physician (Gastroenterology) Gae Dry, MD as Referring Physician (Obstetrics and Gynecology) Telford Nab, RN as Oncology Nurse Navigator Cammie Sickle, MD as Consulting Physician (Oncology)   CHIEF COMPLAINTS/PURPOSE OF CONSULTATION: lung cancer  #  Oncology History Overview Note  #MAY 2022- LLL nodule 2.3 cm Adenocarcinoma Stage IA; s/p lobectomy.  No adjuvant therapy  # CT scan 4th June 2023-highly concerning for recurrent/metastatic disease with-. New left adrenal metastasis;  Ground-glass and part solid nodules in the peripheral right lower lobe, unchanged from 11/07/2021 but slightly enlarged from 01/01/2018. Findings are suspicious for indolent adenocarcinoma.  F One- PFL1 =80%; KRAS G12C PET scan JUNE 20th- 2023-  Enlarged 4 cm hypermetabolic LEFT adrenal metastasis;  Suspicion of metastatic adenopathy to the LEFT hilum.  Part solid nodule in the RIGHT lower lobe without metabolic activity.   3 ADRENAL BIOPSY- CK7 POSITIVE ADENOCARCINOMA- There is limited tissue remaining for ancillary testing, not likely  sufficient for NGS testing.   # AUG 7th, 2023-single agent Keytruda.    Primary cancer of left lower lobe of lung ([place])  05/09/2021 Initial Diagnosis   Primary cancer of left lower lobe of lung ([place])   07/04/2022 -  Chemotherapy   Patient is on Treatment Plan : LUNG NSCLC Pembrolizumab (200) q21d     07/10/2022 - 07/10/2022 Chemotherapy   Patient is on Treatment Plan : LUNG NSCLC Pembrolizumab  (200) q21d        HISTORY OF PRESENTING ILLNESS: Patient  ambulating.  Alone.   Laura Nielsen 67 y.o.  female history of smoking; prior history of lung cancer-with likely recurrence to left adrenal gland [s/p Bx CK-7 positive TTF-1 negative]-clinically suggestive of recurrent lung cancer on single agent Keytruda  Her back pain is under control.  Reports constipation which is a longstanding issue.  Going to bathroom once a week.  She has been using enema.  Denies any abdominal pain. Has chronic shortness of breath and fatigue. Last night she could not sleep well due to constant feeling of hot and cold.  Has happened in the past but it is occasional.  Review of Systems  Constitutional:  Positive for malaise/fatigue. Negative for chills, diaphoresis, fever and weight loss.  HENT:  Negative for nosebleeds and sore throat.   Eyes:  Negative for double vision.  Respiratory:  Negative for cough, hemoptysis, sputum production, shortness of breath and wheezing.   Cardiovascular:  Negative for palpitations, orthopnea and leg swelling.  Gastrointestinal:  Negative for abdominal pain, blood in stool, constipation, diarrhea, heartburn, melena, nausea and vomiting.  Genitourinary:  Negative for dysuria, frequency and urgency.  Musculoskeletal:  Positive for back pain and joint pain.  Skin: Negative.  Negative for itching and rash.  Neurological:  Positive for tingling. Negative for dizziness, focal weakness, weakness and headaches.  Endo/Heme/Allergies:  Does not bruise/bleed easily.  Psychiatric/Behavioral:  Negative for depression. The patient is not nervous/anxious and does not have insomnia.      MEDICAL HISTORY:  Past Medical History:  Diagnosis Date   AAA (abdominal aortic aneurysm) ([place])    s/p EVAR AAA 03/27/13  Arthritis    Asthma    Cancer ([place])    Complication of anesthesia    BP drops after   History of kidney stones    Osteoporosis    Sleep apnea     SURGICAL HISTORY: Past  Surgical History:  Procedure Laterality Date   ABDOMINAL AORTIC ENDOVASCULAR STENT GRAFT  03/27/2013   Dr. Hortencia Pilar   Cardiac catheterization  02/2009   70-80% stenosis RCA stent placed. started on Plavix   CARDIAC CATHETERIZATION     COLONOSCOPY WITH PROPOFOL N/A 01/05/2021   Procedure: COLONOSCOPY WITH PROPOFOL;  Surgeon: Virgel Manifold, MD;  Location: [facility] ENDOSCOPY;  Service: Endoscopy;  Laterality: N/A;   CORONARY ANGIOPLASTY     stent placed   ESOPHAGOGASTRODUODENOSCOPY (EGD) WITH PROPOFOL N/A 01/05/2021   Procedure: ESOPHAGOGASTRODUODENOSCOPY (EGD) WITH PROPOFOL;  Surgeon: Virgel Manifold, MD;  Location: [facility] ENDOSCOPY;  Service: Endoscopy;  Laterality: N/A;   INTERCOSTAL NERVE BLOCK Left 04/06/2021   Procedure: INTERCOSTAL NERVE BLOCK;  Surgeon: Melrose Nakayama, MD;  Location: [facility];  Service: Thoracic;  Laterality: Left;   IR IMAGING GUIDED PORT INSERTION  06/20/2022   KIDNEY STONE SURGERY  1999   LUNG LOBECTOMY Left 04/06/2021   robotic left lower lobectomy 04/06/2021 Dr. Roxan Hockey for Stage 1A adenocarcinoma   NODE DISSECTION Left 04/06/2021   Procedure: NODE DISSECTION;  Surgeon: Melrose Nakayama, MD;  Location: [facility];  Service: Thoracic;  Laterality: Left;   TUBAL LIGATION     VAGINAL HYSTERECTOMY     Menometrorrhagia. Excessive bleeding. Unknown if cervix removed.     SOCIAL HISTORY: Social History   Socioeconomic History   Marital status: Divorced    Spouse name: Not on file   Number of children: 2   Years of education: H/S   Highest education level: High school graduate  Occupational History   Occupation: Disabled   Occupation: retired  Tobacco Use   Smoking status: Former    Packs/day: 0.25    Years: 44.50    Additional pack years: 0.00    Total pack years: 11.13    Types: Cigarettes    Quit date: 04/05/2021    Years since quitting: 1.8   Smokeless tobacco: Never   Tobacco comments:    12/23/19 states she quit for 3 months and  then started back. Pt is going to talk with MD about this.  Vaping Use   Vaping Use: Never used  Substance and Sexual Activity   Alcohol use: Yes    Comment: rarely - once a year   Drug use: No   Sexual activity: Not on file  Other Topics Concern   Not on file  Social History Narrative   Hx of smoking; quit prior to lung surgery. Lives in [place]- self. No alcohol. 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 On disability sec to spinal pain.    Social Determinants of Health   Financial Resource Strain: Medium Risk (02/16/2023)   Overall Financial Resource Strain (CARDIA)    Difficulty of Paying Living Expenses: Somewhat hard  Food Insecurity: Patient Declined (02/16/2023)   Hunger Vital Sign    Worried About Running Out of Food in the Last Year: Patient declined    [place] in the Last Year: Patient declined  Transportation Needs: No Transportation Needs (02/16/2023)   PRAPARE - Hydrologist (Medical): No    Lack of Transportation (Non-Medical): No  Physical Activity: Unknown (02/16/2023)   Exercise Vital Sign  Days of Exercise per Week: 0 days    Minutes of Exercise per Session: Patient declined  Stress: Stress Concern Present (02/16/2023)   [place]    Feeling of Stress : Rather much  Social Connections: Unknown (02/16/2023)   Social Connection and Isolation Panel [NHANES]    Frequency of Communication with Friends and Family: More than three times a week    Frequency of Social Gatherings with Friends and Family: Once a week    Attends Religious Services: Not on Diplomatic Services operational officer of Clubs or Organizations: No    Attends Archivist Meetings: Never    Marital Status: Patient declined  Intimate Partner Violence: Not At Risk (02/20/2023)   Humiliation, Afraid, Rape, and Kick questionnaire    Fear of Current or Ex-Partner: No    Emotionally Abused: No    Physically  Abused: No    Sexually Abused: No    FAMILY HISTORY: Family History  Problem Relation Age of Onset   Hypertension Mother    Coronary artery disease Mother    Heart attack Mother        acute   Cancer Mother    Alcohol abuse Father    Depression Father    Hypertension Father    Heart attack Father 26       acute   Alcohol abuse Sister    Hyperlipidemia Sister    Hypertension Sister    Cancer Sister 26   Heart attack Sister        x's 2   Coronary artery disease Sister 28       x's 2   Breast cancer Sister 59    ALLERGIES:  is allergic to atorvastatin, omeprazole, and bupropion.  MEDICATIONS:  Current Outpatient Medications  Medication Sig Dispense Refill   allopurinol (ZYLOPRIM) 100 MG tablet Take 1 tablet (100 mg total) by mouth daily. 90 tablet 3   aspirin 81 MG tablet Take 81 mg by mouth daily.      bisoprolol (ZEBETA) 10 MG tablet Take 1 tablet (10 mg total) by mouth daily. 90 tablet 4   clopidogrel (PLAVIX) 75 MG tablet Take 1 tablet (75 mg total) by mouth daily. 90 tablet 4   cyanocobalamin 1000 MCG tablet Take 1,000 mcg by mouth daily.     diazepam (VALIUM) 5 MG tablet Take 1 tablet (5 mg total) by mouth every 12 (twelve) hours as needed. 30 tablet 0   fluticasone (FLONASE) 50 MCG/ACT nasal spray Place into both nostrils daily.     gabapentin (NEURONTIN) 300 MG capsule Take 1 capsule (300 mg total) by mouth 3 (three) times daily AND 2 capsules (600 mg total) at bedtime.     HYDROcodone-acetaminophen (NORCO/VICODIN) 5-325 MG tablet Take 1-2 tablets by mouth every 6 (six) hours as needed for moderate pain. 20 tablet 0   ibuprofen (ADVIL) 200 MG tablet Take 400 mg by mouth 2 (two) times daily as needed (headaches).     lidocaine-prilocaine (EMLA) cream Apply on the port. 30 -45 min  prior to port access. 30 g 3   lisinopril-hydrochlorothiazide (ZESTORETIC) 20-12.5 MG tablet Take 1 tablet by mouth daily. 90 tablet 0   OZEMPIC, 1 MG/DOSE, 4 MG/3ML SOPN Inject 1 mg into  the skin once a week. 3 mL 4   pantoprazole (PROTONIX) 20 MG tablet Take 1 tablet (20 mg total) by mouth daily. 30 tablet 1   potassium chloride SA (KLOR-CON M) 20 MEQ  tablet Take 1 tablet (20 mEq total) by mouth daily for 3 days. 3 tablet 0   PROAIR HFA 108 (90 Base) MCG/ACT inhaler Inhale 2 puffs into the lungs every 6 (six) hours as needed for wheezing or shortness of breath. 8.5 g 4   rosuvastatin (CRESTOR) 20 MG tablet Take 1 tablet (20 mg total) by mouth daily. 90 tablet 0   azelastine (ASTELIN) 0.1 % nasal spray Place 2 sprays into both nostrils 2 (two) times daily. Use in each nostril as directed (Patient not taking: Reported on 09/11/2022) 30 mL 2   Cholecalciferol 25 MCG (1000 UT) capsule Take 1,000 Units by mouth daily. (Patient not taking: Reported on 02/26/2023)     montelukast (SINGULAIR) 10 MG tablet Take 1 tablet (10 mg total) by mouth at bedtime. (Patient not taking: Reported on 10/23/2022) 30 tablet 3   No current facility-administered medications for this visit.   Facility-Administered Medications Ordered in Other Visits  Medication Dose Route Frequency Provider Last Rate Last Admin   heparin lock flush 100 UNIT/ML injection             Mild maculopapular rash on the [place] area left more than right.  PHYSICAL EXAMINATION: ECOG PERFORMANCE STATUS: 1 - Symptomatic but completely ambulatory  Vitals:   02/26/23 0947  BP: 96/67  Pulse: 79  Resp: 18  Temp: 98 F (36.7 C)  SpO2: 100%    Filed Weights   02/26/23 0947  Weight: 211 lb (95.7 kg)     Physical Exam HENT:     Head: Normocephalic and atraumatic.     Mouth/Throat:     Pharynx: No oropharyngeal exudate.  Eyes:     Pupils: Pupils are equal, round, and reactive to light.  Cardiovascular:     Rate and Rhythm: Normal rate and regular rhythm.  Pulmonary:     Comments: Decreased breath sounds bilaterally.  No wheeze or crackles Abdominal:     General: Bowel sounds are normal. There is no distension.      Palpations: Abdomen is soft. There is no mass.     Tenderness: There is no abdominal tenderness. There is no guarding or rebound.  Musculoskeletal:        General: No tenderness. Normal range of motion.     Cervical back: Normal range of motion and neck supple.  Skin:    General: Skin is warm.  Neurological:     Mental Status: She is alert and oriented to person, place, and time.  Psychiatric:        Mood and Affect: Affect normal.      LABORATORY DATA:  I have reviewed the data as listed Lab Results  Component Value Date   WBC 6.0 02/26/2023   HGB 15.1 (H) 02/26/2023   HCT 45.0 02/26/2023   MCV 84.6 02/26/2023   PLT 201 02/26/2023   Recent Labs    01/15/23 0906 02/05/23 0849 02/26/23 0926  NA 138 139 133*  K 3.4* 3.4* 3.3*  CL 103 104 103  CO2 26 25 22   GLUCOSE 99 105* 106*  BUN 19 23 19   CREATININE 0.64 0.72 0.65  CALCIUM 9.2 9.5 9.1  GFRNONAA >60 >60 >60  PROT 7.0 7.1 7.2  ALBUMIN 3.8 3.8 3.9  AST 20 22 26   ALT 16 20 17   ALKPHOS 83 87 98  BILITOT 0.6 0.7 0.8     RADIOGRAPHIC STUDIES: I have personally reviewed the radiological images as listed and agreed with the findings in the report. No  results found.   ASSESSMENT & PLAN:  Primary cancer of left lower lobe of lung ([place]) #JUNE 2023-STAGE IV--recurrent/metastatic disease with New left adrenal metastasis;  PET scan JUNE 20th- 2023-  Enlarged 4 cm hypermetabolic LEFT adrenal metastasis;  Suspicion of metastatic adenopathy to the LEFT hilum.  Part solid nodule in the RIGHT lower lobe without metabolic activity. 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 Currently on single agent Keytruda [PD-L1 greater than 80% ].  Currently on single agent Keytruda.     CT FEB 11th, 2024 [[facility]]- CT CAP- No acute intrathoracic, abdominal, or pelvic pathology. No aortic aneurysm or dissection;  No evidence of metastatic  disease in the chest, abdomen, or pelvis. Several ground-glass/part solid nodular density at the right lung base similar prior CT. see discussion below regarding right ovarian mass.   # Labs reviewed today and acceptable for treatment.  Will proceed with single agent Keytruda.  Patient tolerating well.   # mild Hypokalemia: We rediscussed dietary supplements.  Sent prescription for K-Dur 20 milliequivalents for 3 days.   # Chronic pain: headaches/cervical pain/back pain [Dr.Naveira; pain doctor]  On NSAIDs [ monitor for now. OCT 2023- MRI brain- NEGATIVE for any metastatic disease.   Stable.  FEB 2024- MRI lumbar spine- degenerative disease- on ibuprofen prn- BID. stable   # Left rib pain-Post throacotomy pain/tingling and numbness:  Continue gabapentin -300 mg TID; and then at extra at night prn. stable   # CAD/ PVD- cramping- s/p evaluation [Dr.Schneir] dec 2023 stable.    # COPD-stable encouraged continue to avoid smoking; again counseled to quit smoking; recommend evaluation with pulmonary. stable.    #Right mildly complex cystic adnexal mass noted- s/p prior pelvic ultrasound for further work-up [s/p Gyn-Onc; Dr.Berchuck; felt high risk for surgery] 8.1 x 5.8 cm increased from 6.3 by 4.3 cm. Enlarging septated right ovarian cyst. No metabolic activity was demonstrated on PET-CT of 05/23/2022.  Pelvic ultrasound showed increasing size of the ovarian lesion.  Has been evaluated by GYN Onc in the past plan is for monitoring.  # IV Access :s/p  port placement- stable.     # DISPOSITION: RTC in 3 weeks for MD visit with Dr. Jacinto Reap, labs, [place].    All questions were answered. The patient knows to call the clinic with any problems, questions or concerns.    Laura Canary, MD 02/26/2023 12:55 PM

## 2023-02-27 LAB — T4: T4, Total: 6.4 ug/dL (ref 4.5–12.0)

## 2023-02-28 ENCOUNTER — Inpatient Hospital Stay: Payer: Medicare Other

## 2023-02-28 ENCOUNTER — Telehealth: Payer: Self-pay | Admitting: *Deleted

## 2023-02-28 ENCOUNTER — Other Ambulatory Visit: Payer: Self-pay

## 2023-02-28 ENCOUNTER — Inpatient Hospital Stay (HOSPITAL_BASED_OUTPATIENT_CLINIC_OR_DEPARTMENT_OTHER): Payer: Medicare Other | Admitting: Hospice and Palliative Medicine

## 2023-02-28 ENCOUNTER — Encounter: Payer: Self-pay | Admitting: Hospice and Palliative Medicine

## 2023-02-28 VITALS — BP 90/62 | HR 83 | Temp 98.6°F | Resp 20

## 2023-02-28 DIAGNOSIS — E876 Hypokalemia: Secondary | ICD-10-CM

## 2023-02-28 DIAGNOSIS — C3432 Malignant neoplasm of lower lobe, left bronchus or lung: Secondary | ICD-10-CM

## 2023-02-28 DIAGNOSIS — Z5112 Encounter for antineoplastic immunotherapy: Secondary | ICD-10-CM | POA: Diagnosis not present

## 2023-02-28 DIAGNOSIS — Z79620 Long term (current) use of immunosuppressive biologic: Secondary | ICD-10-CM | POA: Diagnosis not present

## 2023-02-28 DIAGNOSIS — C7972 Secondary malignant neoplasm of left adrenal gland: Secondary | ICD-10-CM | POA: Diagnosis not present

## 2023-02-28 DIAGNOSIS — Z79899 Other long term (current) drug therapy: Secondary | ICD-10-CM | POA: Diagnosis not present

## 2023-02-28 LAB — CMP (CANCER CENTER ONLY)
ALT: 44 U/L (ref 0–44)
AST: 64 U/L — ABNORMAL HIGH (ref 15–41)
Albumin: 4 g/dL (ref 3.5–5.0)
Alkaline Phosphatase: 118 U/L (ref 38–126)
Anion gap: 10 (ref 5–15)
BUN: 17 mg/dL (ref 8–23)
CO2: 23 mmol/L (ref 22–32)
Calcium: 9 mg/dL (ref 8.9–10.3)
Chloride: 98 mmol/L (ref 98–111)
Creatinine: 0.82 mg/dL (ref 0.44–1.00)
GFR, Estimated: >60 mL/min (ref >60)
Glucose, Bld: 103 mg/dL — ABNORMAL HIGH (ref 70–99)
Potassium: 3 mmol/L — ABNORMAL LOW (ref 3.5–5.1)
Sodium: 131 mmol/L — ABNORMAL LOW (ref 135–145)
Total Bilirubin: 1.6 mg/dL — ABNORMAL HIGH (ref 0.3–1.2)
Total Protein: 7.2 g/dL (ref 6.5–8.1)

## 2023-02-28 LAB — MAGNESIUM: Magnesium: 2.1 mg/dL (ref 1.7–2.4)

## 2023-02-28 LAB — CBC WITH DIFFERENTIAL (CANCER CENTER ONLY)
Abs Immature Granulocytes: 0.01 10*3/uL (ref 0.00–0.07)
Basophils Absolute: 0 10*3/uL (ref 0.0–0.1)
Basophils Relative: 1 %
Eosinophils Absolute: 0 10*3/uL (ref 0.0–0.5)
Eosinophils Relative: 0 %
HCT: 44.4 % (ref 36.0–46.0)
Hemoglobin: 15 g/dL (ref 12.0–15.0)
Immature Granulocytes: 0 %
Lymphocytes Relative: 25 %
Lymphs Abs: 1.4 10*3/uL (ref 0.7–4.0)
MCH: 28.2 pg (ref 26.0–34.0)
MCHC: 33.8 g/dL (ref 30.0–36.0)
MCV: 83.5 fL (ref 80.0–100.0)
Monocytes Absolute: 0.6 10*3/uL (ref 0.1–1.0)
Monocytes Relative: 10 %
Neutro Abs: 3.6 10*3/uL (ref 1.7–7.7)
Neutrophils Relative %: 64 %
Platelet Count: 154 10*3/uL (ref 150–400)
RBC: 5.32 MIL/uL — ABNORMAL HIGH (ref 3.87–5.11)
RDW: 13.7 % (ref 11.5–15.5)
WBC Count: 5.6 10*3/uL (ref 4.0–10.5)
nRBC: 0 % (ref 0.0–0.2)

## 2023-02-28 MED ORDER — POTASSIUM CHLORIDE 20 MEQ/100ML IV SOLN
20.0000 meq | Freq: Once | INTRAVENOUS | Status: AC
  Administered 2023-02-28: 20 meq via INTRAVENOUS

## 2023-02-28 MED ORDER — SODIUM CHLORIDE 0.9 % IV SOLN
INTRAVENOUS | Status: DC
  Filled 2023-02-28 (×2): qty 250

## 2023-02-28 MED ORDER — HEPARIN SOD (PORK) LOCK FLUSH 100 UNIT/ML IV SOLN
500.0000 [IU] | Freq: Once | INTRAVENOUS | Status: AC
  Administered 2023-02-28: 500 [IU] via INTRAVENOUS
  Filled 2023-02-28: qty 5

## 2023-02-28 MED ORDER — SODIUM CHLORIDE 0.9% FLUSH
10.0000 mL | Freq: Once | INTRAVENOUS | Status: AC
  Administered 2023-02-28: 10 mL via INTRAVENOUS
  Filled 2023-02-28: qty 10

## 2023-02-28 NOTE — Telephone Encounter (Signed)
Returned pt's phone call. She will attempt to get to cancer center w/in the next 30 mins for smc apts.

## 2023-02-28 NOTE — Progress Notes (Unsigned)
Symptom Management [place] at [facility] [phone] [phone]  Patient Care Team: Birdie Sons, MD as PCP - General (Family Medicine) Ubaldo Glassing Javier Docker, MD as Consulting Physician (Cardiology) Schnier, Dolores Lory, MD (Vascular Surgery) Vladimir Crofts, MD as Consulting Physician (Neurology) Milinda Pointer, MD as Referring Physician (Pain Medicine) Pa, [place] (Optometry) Ubaldo Glassing, Javier Docker, MD as Consulting Physician (Cardiology) Virgel Manifold, MD (Inactive) as Consulting Physician (Gastroenterology) Gae Dry, MD as Referring Physician (Obstetrics and Gynecology) Telford Nab, RN as Oncology Nurse Navigator Cammie Sickle, MD as Consulting Physician (Oncology)   NAME OF PATIENT: Laura Nielsen  [id number]  February 07, 1956   DATE OF VISIT: 02/28/23  REASON FOR CONSULT: Laura Nielsen is a 67 y.o. female with multiple medical problems including stage IV recurrent lung cancer with new left adrenal metastasis.  Patient is on single agent Keytruda.  INTERVAL HISTORY: Patient saw Dr. Darrall Dears on 02/26/2023 at which time patient received cycle 12 Keytruda.  Today, patient presents to [facility] with complaints of not feeling well since Sunday.  She has had occasional chills and then last night she had a low-grade fever and sore throat.  She had a dry cough but states that that has not unusual.  She denies shortness of breath or chest pain.  No nausea or vomiting but she has had constipation for few days.  She denies any abdominal pain or urinary symptoms.  She reports poor appetite. Patient offers no further specific complaints today.   PAST MEDICAL HISTORY: Past Medical History:  Diagnosis Date   AAA (abdominal aortic aneurysm) ([place])    s/p EVAR AAA 03/27/13   Arthritis    Asthma    Cancer (HCC)    Complication of anesthesia    BP drops after   History of kidney stones    Osteoporosis    Sleep apnea      PAST SURGICAL HISTORY:  Past Surgical History:  Procedure Laterality Date   ABDOMINAL AORTIC ENDOVASCULAR STENT GRAFT  03/27/2013   Dr. Hortencia Pilar   Cardiac catheterization  02/2009   70-80% stenosis RCA stent placed. started on Plavix   CARDIAC CATHETERIZATION     COLONOSCOPY WITH PROPOFOL N/A 01/05/2021   Procedure: COLONOSCOPY WITH PROPOFOL;  Surgeon: Virgel Manifold, MD;  Location: [facility] ENDOSCOPY;  Service: Endoscopy;  Laterality: N/A;   CORONARY ANGIOPLASTY     stent placed   ESOPHAGOGASTRODUODENOSCOPY (EGD) WITH PROPOFOL N/A 01/05/2021   Procedure: ESOPHAGOGASTRODUODENOSCOPY (EGD) WITH PROPOFOL;  Surgeon: Virgel Manifold, MD;  Location: [facility] ENDOSCOPY;  Service: Endoscopy;  Laterality: N/A;   INTERCOSTAL NERVE BLOCK Left 04/06/2021   Procedure: INTERCOSTAL NERVE BLOCK;  Surgeon: Melrose Nakayama, MD;  Location: [place];  Service: Thoracic;  Laterality: Left;   IR IMAGING GUIDED PORT INSERTION  06/20/2022   KIDNEY STONE SURGERY  1999   LUNG LOBECTOMY Left 04/06/2021   robotic left lower lobectomy 04/06/2021 Dr. Roxan Hockey for Stage 1A adenocarcinoma   NODE DISSECTION Left 04/06/2021   Procedure: NODE DISSECTION;  Surgeon: Melrose Nakayama, MD;  Location: [place];  Service: Thoracic;  Laterality: Left;   TUBAL LIGATION     VAGINAL HYSTERECTOMY     Menometrorrhagia. Excessive bleeding. Unknown if cervix removed.     HEMATOLOGY/ONCOLOGY HISTORY:  Oncology History Overview Note  #MAY 2022- LLL nodule 2.3 cm Adenocarcinoma Stage IA; s/p lobectomy.  No adjuvant therapy  # CT scan 4th June 2023-highly concerning for recurrent/metastatic disease with-. New left  adrenal metastasis;  Ground-glass and part solid nodules in the peripheral right lower lobe, unchanged from 11/07/2021 but slightly enlarged from 01/01/2018. Findings are suspicious for indolent adenocarcinoma.  F One- PFL1 =80%; KRAS G12C PET scan JUNE 20th- 2023-  Enlarged 4 cm hypermetabolic LEFT adrenal  metastasis;  Suspicion of metastatic adenopathy to the LEFT hilum.  Part solid nodule in the RIGHT lower lobe without metabolic activity.   3 ADRENAL BIOPSY- CK7 POSITIVE ADENOCARCINOMA- There is limited tissue remaining for ancillary testing, not likely  sufficient for NGS testing.   # AUG 7th, 2023-single agent Keytruda.    Primary cancer of left lower lobe of lung ([place])  05/09/2021 Initial Diagnosis   Primary cancer of left lower lobe of lung ([place])   07/04/2022 -  Chemotherapy   Patient is on Treatment Plan : LUNG NSCLC Pembrolizumab (200) q21d     07/10/2022 - 07/10/2022 Chemotherapy   Patient is on Treatment Plan : LUNG NSCLC Pembrolizumab (200) q21d       ALLERGIES:  is allergic to atorvastatin, omeprazole, and bupropion.  MEDICATIONS:  Current Outpatient Medications  Medication Sig Dispense Refill   allopurinol (ZYLOPRIM) 100 MG tablet Take 1 tablet (100 mg total) by mouth daily. 90 tablet 3   aspirin 81 MG tablet Take 81 mg by mouth daily.      bisoprolol (ZEBETA) 10 MG tablet Take 1 tablet (10 mg total) by mouth daily. 90 tablet 4   Cholecalciferol 25 MCG (1000 UT) capsule Take 1,000 Units by mouth daily.     clopidogrel (PLAVIX) 75 MG tablet Take 1 tablet (75 mg total) by mouth daily. 90 tablet 4   fluticasone (FLONASE) 50 MCG/ACT nasal spray Place into both nostrils daily.     gabapentin (NEURONTIN) 300 MG capsule Take 1 capsule (300 mg total) by mouth 3 (three) times daily AND 2 capsules (600 mg total) at bedtime.     HYDROcodone-acetaminophen (NORCO/VICODIN) 5-325 MG tablet Take 1-2 tablets by mouth every 6 (six) hours as needed for moderate pain. 20 tablet 0   ibuprofen (ADVIL) 200 MG tablet Take 400 mg by mouth 2 (two) times daily as needed (headaches).     lidocaine-prilocaine (EMLA) cream Apply on the port. 30 -45 min  prior to port access. 30 g 3   lisinopril-hydrochlorothiazide (ZESTORETIC) 20-12.5 MG tablet Take 1 tablet by mouth daily. 90 tablet 0   montelukast  (SINGULAIR) 10 MG tablet Take 1 tablet (10 mg total) by mouth at bedtime. 30 tablet 3   OZEMPIC, 1 MG/DOSE, 4 MG/3ML SOPN Inject 1 mg into the skin once a week. 3 mL 4   pantoprazole (PROTONIX) 20 MG tablet Take 1 tablet (20 mg total) by mouth daily. 30 tablet 1   PROAIR HFA 108 (90 Base) MCG/ACT inhaler Inhale 2 puffs into the lungs every 6 (six) hours as needed for wheezing or shortness of breath. 8.5 g 4   rosuvastatin (CRESTOR) 20 MG tablet Take 1 tablet (20 mg total) by mouth daily. 90 tablet 0   azelastine (ASTELIN) 0.1 % nasal spray Place 2 sprays into both nostrils 2 (two) times daily. Use in each nostril as directed (Patient not taking: Reported on 02/28/2023) 30 mL 2   cyanocobalamin 1000 MCG tablet Take 1,000 mcg by mouth daily. (Patient not taking: Reported on 02/28/2023)     diazepam (VALIUM) 5 MG tablet Take 1 tablet (5 mg total) by mouth every 12 (twelve) hours as needed. (Patient not taking: Reported on 02/28/2023) 30 tablet 0  potassium chloride SA (KLOR-CON M) 20 MEQ tablet Take 1 tablet (20 mEq total) by mouth daily for 3 days. (Patient not taking: Reported on 02/28/2023) 3 tablet 0   No current facility-administered medications for this visit.   Facility-Administered Medications Ordered in Other Visits  Medication Dose Route Frequency Provider Last Rate Last Admin   0.9 %  sodium chloride infusion   Intravenous Continuous Terease Marcotte, Kirt Boys, NP 999 mL/hr at 02/28/23 1133 New Bag at 02/28/23 1133   heparin lock flush 100 UNIT/ML injection            heparin lock flush 100 unit/mL  500 Units Intravenous Once Shilynn Hoch, Vonna Kotyk R, NP       potassium chloride 20 mEq in 100 mL IVPB  20 mEq Intravenous Once Antonetta Clanton, Vonna Kotyk R, NP       potassium chloride 20 mEq in 100 mL IVPB  20 mEq Intravenous Once Zinia Innocent R, NP 100 mL/hr at 02/28/23 1208 20 mEq at 02/28/23 1208    VITAL SIGNS: BP 90/62   Pulse 83   Temp 98.6 F (37 C) (Oral)   Resp 20   SpO2 95%  There were no vitals  filed for this visit.  Estimated body mass index is 32.08 kg/m as calculated from the following:   Height as of 02/20/23: 5\' 8"  (1.727 m).   Weight as of 02/26/23: 211 lb (95.7 kg).  LABS: CBC:    Component Value Date/Time   WBC 5.6 02/28/2023 1114   WBC 6.0 02/26/2023 0926   HGB 15.0 02/28/2023 1114   HGB 15.9 02/13/2022 1027   HCT 44.4 02/28/2023 1114   HCT 47.7 (H) 02/13/2022 1027   PLT 154 02/28/2023 1114   PLT 237 02/13/2022 1027   MCV 83.5 02/28/2023 1114   MCV 84 02/13/2022 1027   MCV 88 03/28/2013 0354   NEUTROABS 3.6 02/28/2023 1114   NEUTROABS 4.8 02/16/2016 0941   NEUTROABS 7.2 (H) 03/28/2013 0354   LYMPHSABS 1.4 02/28/2023 1114   LYMPHSABS 4.3 (H) 02/16/2016 0941   LYMPHSABS 2.0 03/28/2013 0354   MONOABS 0.6 02/28/2023 1114   MONOABS 0.7 03/28/2013 0354   EOSABS 0.0 02/28/2023 1114   EOSABS 0.3 02/16/2016 0941   EOSABS 0.2 03/28/2013 0354   BASOSABS 0.0 02/28/2023 1114   BASOSABS 0.0 02/16/2016 0941   BASOSABS 0.0 03/28/2013 0354   Comprehensive Metabolic Panel:    Component Value Date/Time   NA 131 (L) 02/28/2023 1114   NA 142 02/13/2022 1027   NA 139 03/28/2013 0354   K 3.0 (L) 02/28/2023 1114   K 3.7 03/28/2013 1811   CL 98 02/28/2023 1114   CL 107 03/28/2013 0354   CO2 23 02/28/2023 1114   CO2 27 03/28/2013 0354   BUN 17 02/28/2023 1114   BUN 15 02/13/2022 1027   BUN 8 03/28/2013 0354   CREATININE 0.82 02/28/2023 1114   CREATININE 0.60 08/30/2017 0920   GLUCOSE 103 (H) 02/28/2023 1114   GLUCOSE 112 (H) 03/28/2013 0354   CALCIUM 9.0 02/28/2023 1114   CALCIUM 8.2 (L) 03/28/2013 0354   AST 64 (H) 02/28/2023 1114   ALT 44 02/28/2023 1114   ALT 19 03/28/2013 0354   ALKPHOS 118 02/28/2023 1114   ALKPHOS 77 03/28/2013 0354   BILITOT 1.6 (H) 02/28/2023 1114   PROT 7.2 02/28/2023 1114   PROT 7.0 02/13/2022 1027   PROT 6.0 (L) 03/28/2013 0354   ALBUMIN 4.0 02/28/2023 1114   ALBUMIN 4.6 02/13/2022 1027   ALBUMIN 3.1 (  L) 03/28/2013 0354     RADIOGRAPHIC STUDIES: No results found.  PERFORMANCE STATUS (ECOG) : 1 - Symptomatic but completely ambulatory  Review of Systems Unless otherwise noted, a complete review of systems is negative.  Physical Exam General: NAD Cardiovascular: regular rate and rhythm Pulmonary: clear ant fields Abdomen: soft, nontender, + bowel sounds GU: no suprapubic tenderness Extremities: no edema, no joint deformities Skin: no rashes Neurological: Nonfocal  IMPRESSION/PLAN: Stage IV NSCLC -patient received Keytruda 2 days ago.  She now presents with vague symptoms of fatigue, sore throat, cough, and poor appetite.  Suspect viral syndrome.  Exam is benign.  Will proceed with supportive care.  Dehydration -IV fluids today  Hypokalemia -patient was prescribed K-Dur by Dr. Darrall Dears but has not yet picked this up from the pharmacy but says that she will start taking it.  Will replete with IV K today.  Hyperbilirubinemia/transaminitis -unclear etiology but suspect viral syndrome.  Patient's exam is benign.  Discussed ED triggers in detail.  Will bring her back early next week for repeat labs and supportive care.  Consider repeat CT if worsening labs.  Constipation -recommend daily bowel regimen with MiraLAX/Senokot  Case and plan discussed with Dr. Darrall Dears.   Patient expressed understanding and was in agreement with this plan. She also understands that She can call clinic at any time with any questions, concerns, or complaints.   Thank you for allowing me to participate in the care of this very pleasant patient.   Time Total: 15 minutes  Visit consisted of counseling and education dealing with the complex and emotionally intense issues of symptom management in the setting of serious illness.Greater than 50%  of this time was spent counseling and coordinating care related to the above assessment and plan.  Signed by: Altha Harm, PhD, NP-C

## 2023-02-28 NOTE — Telephone Encounter (Signed)
Patient called reporting that she continues to have chills as mentioned at her last appointment, but she had fever last night of 100.7 and took ibuprofen this morning it is 99.6. She states that she is not feeling well and cannot eat, she is not really nauseated, but has not desire to eat even when she tries, but she is drinking fluids. She has a "tight cough" which is non productive. She has constipation which is chronic and her last BM was Friday 3/23. She plans to take a fleets enema today which is what she generally does stating that Miralax does not work for her and she does not like the cramping that she gets with other medications for it. She has taken a home COVID test today and it is negative. She does endorse joint pains which is also chronic in nature. She had Keytruda Monday. Please advise

## 2023-03-01 ENCOUNTER — Other Ambulatory Visit: Payer: Self-pay | Admitting: Family Medicine

## 2023-03-01 DIAGNOSIS — E7800 Pure hypercholesterolemia, unspecified: Secondary | ICD-10-CM

## 2023-03-01 NOTE — Telephone Encounter (Signed)
Requested Prescriptions  Pending Prescriptions Disp Refills   rosuvastatin (CRESTOR) 20 MG tablet [Pharmacy Med Name: ROSUVASTATIN CALCIUM 20 MG TAB] 90 tablet 1    Sig: Take 1 tablet (20 mg total) by mouth daily.     Cardiovascular:  Antilipid - Statins 2 Failed - 03/01/2023  9:49 AM      Failed - Lipid Panel in normal range within the last 12 months    Cholesterol, Total  Date Value Ref Range Status  02/13/2023 150 100 - 199 mg/dL Final   LDL Chol Calc (NIH)  Date Value Ref Range Status  02/13/2023 82 0 - 99 mg/dL Final   HDL  Date Value Ref Range Status  02/13/2023 42 >39 mg/dL Final   Triglycerides  Date Value Ref Range Status  02/13/2023 150 (H) 0 - 149 mg/dL Final         Passed - Cr in normal range and within 360 days    Creatinine  Date Value Ref Range Status  02/28/2023 0.82 0.44 - 1.00 mg/dL Final   Creat  Date Value Ref Range Status  08/30/2017 0.60 0.50 - 0.99 mg/dL Final    Comment:    For patients >68 years of age, the reference limit for Creatinine is approximately 13% higher for people identified as African-American. .    Creatinine, POC  Date Value Ref Range Status  12/12/2017 n/a mg/dL Final         Passed - Patient is not pregnant      Passed - Valid encounter within last 12 months    Recent Outpatient Visits           2 weeks ago Type 2 diabetes mellitus without complication, without long-term current use of insulin ([place])   [place] Birdie Sons, MD   6 months ago Type 2 diabetes mellitus without complication, without long-term current use of insulin ([place])   [place], Donald E, MD   1 year ago Type 2 diabetes mellitus without complication, without long-term current use of insulin ([place])   [place], Donald E, MD   1 year ago Acute cough   [place], Dani Gobble, PA-C   1 year ago Benign essential HTN    [place], MD       Future Appointments             In 5 months Fisher, Kirstie Peri, MD [facility], [facility]             lisinopril-hydrochlorothiazide (ZESTORETIC) 20-12.5 MG tablet [Pharmacy Med Name: LISINOPRIL-HCTZ 20-12.5 MG TAB] 90 tablet 1    Sig: Take 1 tablet by mouth daily.     Cardiovascular:  ACEI + Diuretic Combos Failed - 03/01/2023  9:49 AM      Failed - Na in normal range and within 180 days    Sodium  Date Value Ref Range Status  02/28/2023 131 (L) 135 - 145 mmol/L Final  02/13/2022 142 134 - 144 mmol/L Final  03/28/2013 139 136 - 145 mmol/L Final         Failed - K in normal range and within 180 days    Potassium  Date Value Ref Range Status  02/28/2023 3.0 (L) 3.5 - 5.1 mmol/L Final  03/28/2013 3.7 3.5 - 5.1 mmol/L Final         Passed - Cr in normal  range and within 180 days    Creatinine  Date Value Ref Range Status  02/28/2023 0.82 0.44 - 1.00 mg/dL Final   Creat  Date Value Ref Range Status  08/30/2017 0.60 0.50 - 0.99 mg/dL Final    Comment:    For patients >37 years of age, the reference limit for Creatinine is approximately 13% higher for people identified as African-American. .    Creatinine, POC  Date Value Ref Range Status  12/12/2017 n/a mg/dL Final         Passed - eGFR is 30 or above and within 180 days    GFR, Est African American  Date Value Ref Range Status  08/30/2017 115 > OR = 60 mL/min/1.7m2 Final   GFR calc Af Amer  Date Value Ref Range Status  01/25/2021 93 >59 mL/min/1.73 Final    Comment:    **In accordance with recommendations from the NKF-ASN Task force,**   [facility] is in the process of updating its eGFR calculation to the   2021 CKD-EPI creatinine equation that estimates kidney function   without a race variable.    GFR, Est Non African American  Date Value Ref Range Status  08/30/2017 99 > OR = 60 mL/min/1.59m2 Final   GFR, Estimated   Date Value Ref Range Status  02/28/2023 >60 >60 mL/min Final    Comment:    (NOTE) Calculated using the CKD-EPI Creatinine Equation (2021)    eGFR  Date Value Ref Range Status  02/13/2022 94 >59 mL/min/1.73 Final         Passed - Patient is not pregnant      Passed - Last BP in normal range    BP Readings from Last 1 Encounters:  02/28/23 90/62         Passed - Valid encounter within last 6 months    Recent Outpatient Visits           2 weeks ago Type 2 diabetes mellitus without complication, without long-term current use of insulin ([place])   [place] Birdie Sons, MD   6 months ago Type 2 diabetes mellitus without complication, without long-term current use of insulin ([place])   [place], Donald E, MD   1 year ago Type 2 diabetes mellitus without complication, without long-term current use of insulin ([place])   [place], Donald E, MD   1 year ago Acute cough   [place], Dani Gobble, PA-C   1 year ago Benign essential HTN   [place], MD       Future Appointments             In 5 months Fisher, Kirstie Peri, MD [facility], [facility]

## 2023-03-05 ENCOUNTER — Ambulatory Visit
Admission: RE | Admit: 2023-03-05 | Discharge: 2023-03-05 | Disposition: A | Discharge status: Home / Self Care | Payer: Medicare Other | Source: Ambulatory Visit | Attending: Family Medicine | Admitting: Family Medicine

## 2023-03-05 ENCOUNTER — Inpatient Hospital Stay: Payer: Medicare Other | Attending: Internal Medicine

## 2023-03-05 ENCOUNTER — Inpatient Hospital Stay: Payer: Medicare Other

## 2023-03-05 VITALS — BP 92/62 | HR 67 | Temp 96.7°F | Resp 20

## 2023-03-05 DIAGNOSIS — C7972 Secondary malignant neoplasm of left adrenal gland: Secondary | ICD-10-CM | POA: Insufficient documentation

## 2023-03-05 DIAGNOSIS — C3432 Malignant neoplasm of lower lobe, left bronchus or lung: Secondary | ICD-10-CM

## 2023-03-05 DIAGNOSIS — E876 Hypokalemia: Secondary | ICD-10-CM | POA: Insufficient documentation

## 2023-03-05 DIAGNOSIS — Z95828 Presence of other vascular implants and grafts: Secondary | ICD-10-CM

## 2023-03-05 DIAGNOSIS — Z5112 Encounter for antineoplastic immunotherapy: Secondary | ICD-10-CM | POA: Insufficient documentation

## 2023-03-05 DIAGNOSIS — Z1231 Encounter for screening mammogram for malignant neoplasm of breast: Secondary | ICD-10-CM

## 2023-03-05 DIAGNOSIS — Z87891 Personal history of nicotine dependence: Secondary | ICD-10-CM | POA: Diagnosis not present

## 2023-03-05 LAB — CMP (CANCER CENTER ONLY)
ALT: 27 U/L (ref 0–44)
AST: 24 U/L (ref 15–41)
Albumin: 3.9 g/dL (ref 3.5–5.0)
Alkaline Phosphatase: 116 U/L (ref 38–126)
Anion gap: 6 (ref 5–15)
BUN: 18 mg/dL (ref 8–23)
CO2: 27 mmol/L (ref 22–32)
Calcium: 9.4 mg/dL (ref 8.9–10.3)
Chloride: 103 mmol/L (ref 98–111)
Creatinine: 0.64 mg/dL (ref 0.44–1.00)
GFR, Estimated: >60 mL/min (ref >60)
Glucose, Bld: 106 mg/dL — ABNORMAL HIGH (ref 70–99)
Potassium: 3.7 mmol/L (ref 3.5–5.1)
Sodium: 136 mmol/L (ref 135–145)
Total Bilirubin: 0.7 mg/dL (ref 0.3–1.2)
Total Protein: 7 g/dL (ref 6.5–8.1)

## 2023-03-05 LAB — CBC WITH DIFFERENTIAL (CANCER CENTER ONLY)
Abs Immature Granulocytes: 0.07 10*3/uL (ref 0.00–0.07)
Basophils Absolute: 0.1 10*3/uL (ref 0.0–0.1)
Basophils Relative: 1 %
Eosinophils Absolute: 0.4 10*3/uL (ref 0.0–0.5)
Eosinophils Relative: 4 %
HCT: 41.7 % (ref 36.0–46.0)
Hemoglobin: 14.2 g/dL (ref 12.0–15.0)
Immature Granulocytes: 1 %
Lymphocytes Relative: 37 %
Lymphs Abs: 3.5 10*3/uL (ref 0.7–4.0)
MCH: 28.7 pg (ref 26.0–34.0)
MCHC: 34.1 g/dL (ref 30.0–36.0)
MCV: 84.2 fL (ref 80.0–100.0)
Monocytes Absolute: 0.6 10*3/uL (ref 0.1–1.0)
Monocytes Relative: 7 %
Neutro Abs: 4.8 10*3/uL (ref 1.7–7.7)
Neutrophils Relative %: 50 %
Platelet Count: 264 10*3/uL (ref 150–400)
RBC: 4.95 MIL/uL (ref 3.87–5.11)
RDW: 13.7 % (ref 11.5–15.5)
WBC Count: 9.4 10*3/uL (ref 4.0–10.5)
nRBC: 0 % (ref 0.0–0.2)

## 2023-03-05 MED ORDER — SODIUM CHLORIDE 0.9 % IV SOLN
INTRAVENOUS | Status: DC
  Filled 2023-03-05 (×2): qty 250

## 2023-03-05 MED ORDER — HEPARIN SOD (PORK) LOCK FLUSH 100 UNIT/ML IV SOLN
500.0000 [IU] | Freq: Once | INTRAVENOUS | Status: AC
  Administered 2023-03-05: 500 [IU]
  Filled 2023-03-05: qty 5

## 2023-03-05 MED ORDER — SODIUM CHLORIDE 0.9% FLUSH
10.0000 mL | Freq: Once | INTRAVENOUS | Status: AC
  Administered 2023-03-05: 10 mL via INTRAVENOUS
  Filled 2023-03-05: qty 10

## 2023-03-06 ENCOUNTER — Other Ambulatory Visit: Payer: Self-pay

## 2023-03-19 ENCOUNTER — Other Ambulatory Visit: Payer: Medicare Other

## 2023-03-19 ENCOUNTER — Other Ambulatory Visit: Payer: Self-pay | Admitting: Family Medicine

## 2023-03-19 ENCOUNTER — Ambulatory Visit: Payer: Medicare Other

## 2023-03-19 ENCOUNTER — Ambulatory Visit: Payer: Medicare Other | Admitting: Internal Medicine

## 2023-03-19 ENCOUNTER — Inpatient Hospital Stay (HOSPITAL_BASED_OUTPATIENT_CLINIC_OR_DEPARTMENT_OTHER): Payer: Medicare Other | Admitting: Internal Medicine

## 2023-03-19 ENCOUNTER — Encounter: Payer: Self-pay | Admitting: Internal Medicine

## 2023-03-19 ENCOUNTER — Other Ambulatory Visit: Payer: Self-pay

## 2023-03-19 ENCOUNTER — Inpatient Hospital Stay: Payer: Medicare Other

## 2023-03-19 VITALS — BP 133/78 | HR 75 | Temp 97.2°F | Resp 17 | Wt 211.0 lb

## 2023-03-19 DIAGNOSIS — E876 Hypokalemia: Secondary | ICD-10-CM | POA: Diagnosis not present

## 2023-03-19 DIAGNOSIS — C3432 Malignant neoplasm of lower lobe, left bronchus or lung: Secondary | ICD-10-CM

## 2023-03-19 DIAGNOSIS — Z87891 Personal history of nicotine dependence: Secondary | ICD-10-CM | POA: Diagnosis not present

## 2023-03-19 DIAGNOSIS — R978 Other abnormal tumor markers: Secondary | ICD-10-CM | POA: Diagnosis not present

## 2023-03-19 DIAGNOSIS — E119 Type 2 diabetes mellitus without complications: Secondary | ICD-10-CM

## 2023-03-19 DIAGNOSIS — C7972 Secondary malignant neoplasm of left adrenal gland: Secondary | ICD-10-CM | POA: Diagnosis not present

## 2023-03-19 DIAGNOSIS — Z5112 Encounter for antineoplastic immunotherapy: Secondary | ICD-10-CM | POA: Diagnosis not present

## 2023-03-19 LAB — COMPREHENSIVE METABOLIC PANEL
ALT: 13 U/L (ref 0–44)
AST: 22 U/L (ref 15–41)
Albumin: 3.8 g/dL (ref 3.5–5.0)
Alkaline Phosphatase: 86 U/L (ref 38–126)
Anion gap: 8 (ref 5–15)
BUN: 20 mg/dL (ref 8–23)
CO2: 24 mmol/L (ref 22–32)
Calcium: 9.2 mg/dL (ref 8.9–10.3)
Chloride: 107 mmol/L (ref 98–111)
Creatinine, Ser: 0.66 mg/dL (ref 0.44–1.00)
GFR, Estimated: >60 mL/min (ref >60)
Glucose, Bld: 98 mg/dL (ref 70–99)
Potassium: 3.6 mmol/L (ref 3.5–5.1)
Sodium: 139 mmol/L (ref 135–145)
Total Bilirubin: 0.6 mg/dL (ref 0.3–1.2)
Total Protein: 6.9 g/dL (ref 6.5–8.1)

## 2023-03-19 LAB — CBC WITH DIFFERENTIAL/PLATELET
Abs Immature Granulocytes: 0.02 10*3/uL (ref 0.00–0.07)
Basophils Absolute: 0.1 10*3/uL (ref 0.0–0.1)
Basophils Relative: 1 %
Eosinophils Absolute: 0.4 10*3/uL (ref 0.0–0.5)
Eosinophils Relative: 4 %
HCT: 43.5 % (ref 36.0–46.0)
Hemoglobin: 14.4 g/dL (ref 12.0–15.0)
Immature Granulocytes: 0 %
Lymphocytes Relative: 34 %
Lymphs Abs: 3.2 10*3/uL (ref 0.7–4.0)
MCH: 28.5 pg (ref 26.0–34.0)
MCHC: 33.1 g/dL (ref 30.0–36.0)
MCV: 86 fL (ref 80.0–100.0)
Monocytes Absolute: 0.6 10*3/uL (ref 0.1–1.0)
Monocytes Relative: 7 %
Neutro Abs: 5.1 10*3/uL (ref 1.7–7.7)
Neutrophils Relative %: 54 %
Platelets: 254 10*3/uL (ref 150–400)
RBC: 5.06 MIL/uL (ref 3.87–5.11)
RDW: 14 % (ref 11.5–15.5)
WBC: 9.4 10*3/uL (ref 4.0–10.5)
nRBC: 0 % (ref 0.0–0.2)

## 2023-03-19 MED ORDER — SODIUM CHLORIDE 0.9% FLUSH
10.0000 mL | INTRAVENOUS | Status: DC | PRN
  Administered 2023-03-19: 10 mL via INTRAVENOUS
  Filled 2023-03-19: qty 10

## 2023-03-19 MED ORDER — SODIUM CHLORIDE 0.9 % IV SOLN
200.0000 mg | Freq: Once | INTRAVENOUS | Status: AC
  Administered 2023-03-19: 200 mg via INTRAVENOUS
  Filled 2023-03-19: qty 8

## 2023-03-19 MED ORDER — HEPARIN SOD (PORK) LOCK FLUSH 100 UNIT/ML IV SOLN
500.0000 [IU] | Freq: Once | INTRAVENOUS | Status: AC | PRN
  Administered 2023-03-19: 500 [IU]
  Filled 2023-03-19: qty 5

## 2023-03-19 MED ORDER — HEPARIN SOD (PORK) LOCK FLUSH 100 UNIT/ML IV SOLN
500.0000 [IU] | Freq: Once | INTRAVENOUS | Status: DC
  Filled 2023-03-19: qty 5

## 2023-03-19 MED ORDER — SODIUM CHLORIDE 0.9 % IV SOLN
Freq: Once | INTRAVENOUS | Status: AC
  Filled 2023-03-19: qty 250

## 2023-03-19 NOTE — Progress Notes (Signed)
Patient here for oncology follow-up appointment,  concerns of chronic constipation and headaches

## 2023-03-19 NOTE — Patient Instructions (Signed)
[place] [facility] [place] [facility]  Discharge Instructions: Thank you for choosing [facility] to provide your oncology and hematology care.  If you have a lab appointment with the Cancer Center, please go directly to the Cancer Center and check in at the registration area.  Wear comfortable clothing and clothing appropriate for easy access to any Portacath or PICC line.   We strive to give you quality time with your provider. You may need to reschedule your appointment if you arrive late (15 or more minutes).  Arriving late affects you and other patients whose appointments are after yours.  Also, if you miss three or more appointments without notifying the office, you may be dismissed from the clinic at the provider's discretion.      For prescription refill requests, have your pharmacy contact our office and allow 72 hours for refills to be completed.    Today you received the following chemotherapy and/or immunotherapy agents- Keytruda      To help prevent nausea and vomiting after your treatment, we encourage you to take your nausea medication as directed.  BELOW ARE SYMPTOMS THAT SHOULD BE REPORTED IMMEDIATELY: *FEVER GREATER THAN 100.4 F (38 C) OR HIGHER *CHILLS OR SWEATING *NAUSEA AND VOMITING THAT IS NOT CONTROLLED WITH YOUR NAUSEA MEDICATION *UNUSUAL SHORTNESS OF BREATH *UNUSUAL BRUISING OR BLEEDING *URINARY PROBLEMS (pain or burning when urinating, or frequent urination) *BOWEL PROBLEMS (unusual diarrhea, constipation, pain near the anus) TENDERNESS IN MOUTH AND THROAT WITH OR WITHOUT PRESENCE OF ULCERS (sore throat, sores in mouth, or a toothache) UNUSUAL RASH, SWELLING OR PAIN  UNUSUAL VAGINAL DISCHARGE OR ITCHING   Items with * indicate a potential emergency and should be followed up as soon as possible or go to the Emergency Department if any problems should occur.  Please show the CHEMOTHERAPY ALERT CARD or IMMUNOTHERAPY ALERT CARD at check-in to  the Emergency Department and triage nurse.  Should you have questions after your visit or need to cancel or reschedule your appointment, please contact [facility] [place] [facility]  [phone] and follow the prompts.  Office hours are 8:00 a.m. to 4:30 p.m. Monday - Friday. Please note that voicemails left after 4:00 p.m. may not be returned until the following business day.  We are closed weekends and major holidays. You have access to a nurse at all times for urgent questions. Please call the main number to the clinic [phone] and follow the prompts.  For any non-urgent questions, you may also contact your provider using MyChart. We now offer e-Visits for anyone 18 and older to request care online for non-urgent symptoms. For details visit [email or web address].   Also download the MyChart app! Go to the app store, search "MyChart", open the app, select [place], and log in with your MyChart username and password.   

## 2023-03-19 NOTE — Assessment & Plan Note (Addendum)
#  JUNE 2023-STAGE IV--recurrent/metastatic disease with New left adrenal metastasis;  PET scan JUNE 20th- 2023-  Enlarged 4 cm hypermetabolic LEFT adrenal metastasis;  Suspicion of metastatic adenopathy to the LEFT hilum.  Part solid nodule in the RIGHT lower lobe without metabolic activity. 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 Currently on single agent Keytruda [PD-L1 greater than 80% ].  Currently on single agent Keytruda.    CT FEB 11th, 2024 [ER]- CT CAP- No acute intrathoracic, abdominal, or pelvic pathology. No aortic aneurysm or dissection;  No evidence of metastatic disease in the chest, abdomen, or pelvis. Several ground-glass/part solid nodular density at the right lung base similar prior CT. see discussion below regarding right ovarian mass.  # Proceed of Keytruda single agent. Labs today reviewed;  acceptable for treatment today. Tolerating well except for mild chills. TSH- March 2024- WNL. stable  # mild Hypokalemia: discussed dietary supplement. stable  # Chronic pain: headaches/cervical pain/back pain [Dr.Naveira; pain doctor]  On NSAIDs [ monitor for now. OCT 2023- MRI brain- NEGATIVE for any metastatic disease.   Stable.  FEB 2024- MRI lumbar spine- degenerative disease- on ibuprofen prn- BID. stable  # Left rib pain-Post throacotomy pain/tingling and numbness:  Continue gabapentin -300 mg TID; and then at extra at night prn. stable  # CAD/ PVD- cramping- s/p evaluation [Dr.Schneir] dec 2023 stable  # COPD-stable encouraged continue to avoid smoking; again counseled to quit smoking; recommend evaluation with pulmonary. stable  #Right mildly complex cystic adnexal mass noted- s/p prior pelvic ultrasound for further work-up [s/p Gyn-Onc; Dr.Berchuck; felt high risk for surgery] 8.1 x 5.8 cm increased from 6.3 by 4.3 cm. Enlarging septated right ovarian cyst. No metabolic  activity was demonstrated on PET-CT of 05/23/2022.  Pelvic ultrasound showed increasing size of the ovarian lesion.  Discussed with gyn-Onc. Recommend evaluation with gyn-Onc; sent messg to [facility].   # IV Access :s/p  port placement- stable.   *AM appts- Monday-   # DISPOSITION:  # keytruda today; # Please order ca 125/draw today #Follow-up in 3 weeks-MD labs/port- cbc/cmp; Keytruda; Dr. Leonard Schwartz

## 2023-03-19 NOTE — Telephone Encounter (Signed)
Requested Prescriptions  Pending Prescriptions Disp Refills   pantoprazole (PROTONIX) 20 MG tablet [Pharmacy Med Name: PANTOPRAZOLE SOD DR 20 MG TAB] 30 tablet 0    Sig: Take 1 tablet (20 mg total) by mouth daily.     Gastroenterology: Proton Pump Inhibitors Passed - 03/19/2023  4:37 PM      Passed - Valid encounter within last 12 months    Recent Outpatient Visits           1 month ago Type 2 diabetes mellitus without complication, without long-term current use of insulin (HCC)   [place] [facility] Malva Limes, MD   7 months ago Type 2 diabetes mellitus without complication, without long-term current use of insulin (HCC)   [place] [facility] Malva Limes, MD   1 year ago Type 2 diabetes mellitus without complication, without long-term current use of insulin (HCC)   [place] [facility] Malva Limes, MD   1 year ago Acute cough   [place] [facility] Mecum, Oswaldo Conroy, PA-C   1 year ago Benign essential HTN   [place] [facility] Malva Limes, MD       Future Appointments             In 4 months Fisher, Demetrios Isaacs, MD [place] [facility], [facility]             OZEMPIC, 1 MG/DOSE, 4 MG/3ML SOPN [Pharmacy Med Name: OZEMPIC 1 MG/DOSE (4 MG/3 ML)] 3 mL 0    Sig: Inject 1 mg into the skin once a week.     Endocrinology:  Diabetes - GLP-1 Receptor Agonists - semaglutide Failed - 03/19/2023  4:37 PM      Failed - HBA1C in normal range and within 180 days    Hemoglobin A1C  Date Value Ref Range Status  02/13/2023 5.9 (A) 4.0 - 5.6 % Final   Hgb A1c MFr Bld  Date Value Ref Range Status  02/13/2022 5.8 (H) 4.8 - 5.6 % Final    Comment:             Prediabetes: 5.7 - 6.4          Diabetes: >6.4          Glycemic control for adults with diabetes: <7.0          Passed - Cr in normal range and within 360 days    Creatinine  Date Value Ref Range Status   03/05/2023 0.64 0.44 - 1.00 mg/dL Final   Creat  Date Value Ref Range Status  08/30/2017 0.60 0.50 - 0.99 mg/dL Final    Comment:    For patients >18 years of age, the reference limit for Creatinine is approximately 13% higher for people identified as African-American. .    Creatinine, Ser  Date Value Ref Range Status  03/19/2023 0.66 0.44 - 1.00 mg/dL Final   Creatinine, POC  Date Value Ref Range Status  12/12/2017 n/a mg/dL Final         Passed - Valid encounter within last 6 months    Recent Outpatient Visits           1 month ago Type 2 diabetes mellitus without complication, without long-term current use of insulin [facility])   [place] [facility] Malva Limes, MD   7 months ago Type 2 diabetes mellitus without complication, without long-term current use of insulin (HCC)   [place]  [facility] Malva Limes, MD   1 year ago Type 2 diabetes mellitus without complication, without long-term current use of insulin [facility])   [place] [facility] Malva Limes, MD   1 year ago Acute cough   [place] [facility] Mecum, Oswaldo Conroy, PA-C   1 year ago Benign essential HTN   [place] [facility] Malva Limes, MD       Future Appointments             In 4 months Fisher, Demetrios Isaacs, MD [facility], [facility]

## 2023-03-19 NOTE — Progress Notes (Signed)
[facility] CONSULT NOTE  Patient Care Team: Malva Limes, MD as PCP - General (Family Medicine) Lady Gary Darlin Priestly, MD as Consulting Physician (Cardiology) Schnier, Latina Craver, MD (Vascular Surgery) Lonell Face, MD as Consulting Physician (Neurology) Delano Metz, MD as Referring Physician (Pain Medicine) Pa, [facility] (Optometry) Lady Gary, Darlin Priestly, MD as Consulting Physician (Cardiology) Pasty Spillers, MD (Inactive) as Consulting Physician (Gastroenterology) Nadara Mustard, MD as Referring Physician (Obstetrics and Gynecology) Glory Buff, RN as Oncology Nurse Navigator Earna Coder, MD as Consulting Physician (Oncology)   CHIEF COMPLAINTS/PURPOSE OF CONSULTATION: lung cancer  #  Oncology History Overview Note  #MAY 2022- LLL nodule 2.3 cm Adenocarcinoma Stage IA; s/p lobectomy.  No adjuvant therapy  # CT scan 4th June 2023-highly concerning for recurrent/metastatic disease with-. New left adrenal metastasis;  Ground-glass and part solid nodules in the peripheral right lower lobe, unchanged from 11/07/2021 but slightly enlarged from 01/01/2018. Findings are suspicious for indolent adenocarcinoma.  F One- PFL1 =80%; KRAS G12C PET scan JUNE 20th- 2023-  Enlarged 4 cm hypermetabolic LEFT adrenal metastasis;  Suspicion of metastatic adenopathy to the LEFT hilum.  Part solid nodule in the RIGHT lower lobe without metabolic activity.   3 ADRENAL BIOPSY- CK7 POSITIVE ADENOCARCINOMA- There is limited tissue remaining for ancillary testing, not likely  sufficient for NGS testing.   # AUG 7th, 2023-single agent Keytruda.    Primary cancer of left lower lobe of lung  05/09/2021 Initial Diagnosis   Primary cancer of left lower lobe of lung (HCC)   07/04/2022 -  Chemotherapy   Patient is on Treatment Plan : LUNG NSCLC Pembrolizumab (200) q21d     07/10/2022 - 07/10/2022 Chemotherapy   Patient is on Treatment Plan : LUNG NSCLC Pembrolizumab (200) q21d         HISTORY OF PRESENTING ILLNESS: Patient  ambulating.  Alone.   Laura Nielsen 67 y.o.  female history of smoking; prior history of lung cancer-with likely recurrence to left adrenal gland [s/p Bx CK-7 positive TTF-1 negative]-clinically suggestive of recurrent lung cancer on single agent Rande Lawman is here for follow-up.  In the interim patient was evaluated by symptom management for viral illness/transaminitis.   Patient continues to have concerns of chronic constipation and headaches.  Denies any neck pain.  Also complains of vision changes for which patient has been evaluated by ophthalmology.  She is currently awaiting new glasses.  Denies any abdominal pain. Otherwise patient continues to have mild shortness of breath on exertion.  Denies any nausea vomiting. Complains of cramping in the legs chronic.   Review of Systems  Constitutional:  Negative for chills, diaphoresis, fever, malaise/fatigue and weight loss.  HENT:  Negative for nosebleeds and sore throat.   Eyes:  Negative for double vision.  Respiratory:  Negative for cough, hemoptysis, sputum production, shortness of breath and wheezing.   Cardiovascular:  Positive for chest pain. Negative for palpitations, orthopnea and leg swelling.  Gastrointestinal:  Negative for abdominal pain, blood in stool, constipation, diarrhea, heartburn, melena, nausea and vomiting.  Genitourinary:  Negative for dysuria, frequency and urgency.  Musculoskeletal:  Positive for back pain and joint pain.  Skin: Negative.  Negative for itching and rash.  Neurological:  Positive for tingling. Negative for dizziness, focal weakness, weakness and headaches.  Endo/Heme/Allergies:  Does not bruise/bleed easily.  Psychiatric/Behavioral:  Negative for depression. The patient is not nervous/anxious and does not have insomnia.      MEDICAL HISTORY:  Past Medical  History:  Diagnosis Date   AAA (abdominal aortic aneurysm)    s/p EVAR AAA 03/27/13   Arthritis     Asthma    Cancer    Complication of anesthesia    BP drops after   History of kidney stones    Osteoporosis    Sleep apnea     SURGICAL HISTORY: Past Surgical History:  Procedure Laterality Date   ABDOMINAL AORTIC ENDOVASCULAR STENT GRAFT  03/27/2013   Dr. Levora Dredge   Cardiac catheterization  02/2009   70-80% stenosis RCA stent placed. started on Plavix   CARDIAC CATHETERIZATION     COLONOSCOPY WITH PROPOFOL N/A 01/05/2021   Procedure: COLONOSCOPY WITH PROPOFOL;  Surgeon: Pasty Spillers, MD;  Location: [facility] ENDOSCOPY;  Service: Endoscopy;  Laterality: N/A;   CORONARY ANGIOPLASTY     stent placed   ESOPHAGOGASTRODUODENOSCOPY (EGD) WITH PROPOFOL N/A 01/05/2021   Procedure: ESOPHAGOGASTRODUODENOSCOPY (EGD) WITH PROPOFOL;  Surgeon: Pasty Spillers, MD;  Location: [facility] ENDOSCOPY;  Service: Endoscopy;  Laterality: N/A;   INTERCOSTAL NERVE BLOCK Left 04/06/2021   Procedure: INTERCOSTAL NERVE BLOCK;  Surgeon: Loreli Slot, MD;  Location: [facility] OR;  Service: Thoracic;  Laterality: Left;   IR IMAGING GUIDED PORT INSERTION  06/20/2022   KIDNEY STONE SURGERY  1999   LUNG LOBECTOMY Left 04/06/2021   robotic left lower lobectomy 04/06/2021 Dr. Dorris Fetch for Stage 1A adenocarcinoma   NODE DISSECTION Left 04/06/2021   Procedure: NODE DISSECTION;  Surgeon: Loreli Slot, MD;  Location: [facility] OR;  Service: Thoracic;  Laterality: Left;   TUBAL LIGATION     VAGINAL HYSTERECTOMY     Menometrorrhagia. Excessive bleeding. Unknown if cervix removed.     SOCIAL HISTORY: Social History   Socioeconomic History   Marital status: Divorced    Spouse name: Not on file   Number of children: 2   Years of education: H/S   Highest education level: High school graduate  Occupational History   Occupation: Disabled   Occupation: retired  Tobacco Use   Smoking status: Former    Packs/day: 0.25    Years: 44.50    Additional pack years: 0.00    Total pack years: 11.13     Types: Cigarettes    Quit date: 04/05/2021    Years since quitting: 1.9   Smokeless tobacco: Never   Tobacco comments:    12/23/19 states she quit for 3 months and then started back. Pt is going to talk with MD about this.  Vaping Use   Vaping Use: Never used  Substance and Sexual Activity   Alcohol use: Yes    Comment: rarely - once a year   Drug use: No   Sexual activity: Not on file  Other Topics Concern   Not on file  Social History Narrative   Hx of smoking; quit prior to lung surgery. Lives in [place]- self. No alcohol. Used to work in [employer], [employer]- Facilities manager. On disability sec to spinal pain.    Social Determinants of Health   Financial Resource Strain: Medium Risk (02/16/2023)   Overall Financial Resource Strain (CARDIA)    Difficulty of Paying Living Expenses: Somewhat hard  Food Insecurity: Patient Declined (02/16/2023)   Hunger Vital Sign    Worried About Running Out of Food in the Last Year: Patient declined    Ran Out of Food in the Last Year: Patient declined  Transportation Needs: No Transportation Needs (02/16/2023)   PRAPARE - Administrator, Civil Service (Medical): No  Lack of Transportation (Non-Medical): No  Physical Activity: Unknown (02/16/2023)   Exercise Vital Sign    Days of Exercise per Week: 0 days    Minutes of Exercise per Session: Patient declined  Stress: Stress Concern Present (02/16/2023)   [employer] of Occupational Health - Occupational Stress Questionnaire    Feeling of Stress : Rather much  Social Connections: Unknown (02/16/2023)   Social Connection and Isolation Panel [NHANES]    Frequency of Communication with Friends and Family: More than three times a week    Frequency of Social Gatherings with Friends and Family: Once a week    Attends Religious Services: Not on Insurance claims handler of Clubs or Organizations: No    Attends Banker Meetings: Never    Marital Status: Patient declined  Intimate Partner  Violence: Not At Risk (02/20/2023)   Humiliation, Afraid, Rape, and Kick questionnaire    Fear of Current or Ex-Partner: No    Emotionally Abused: No    Physically Abused: No    Sexually Abused: No    FAMILY HISTORY: Family History  Problem Relation Age of Onset   Hypertension Mother    Coronary artery disease Mother    Heart attack Mother        acute   Cancer Mother    Alcohol abuse Father    Depression Father    Hypertension Father    Heart attack Father 66       acute   Alcohol abuse Sister    Hyperlipidemia Sister    Hypertension Sister    Cancer Sister 7   Heart attack Sister        x's 2   Coronary artery disease Sister 27       x's 2   Breast cancer Sister 51    ALLERGIES:  is allergic to atorvastatin, omeprazole, and bupropion.  MEDICATIONS:  Current Outpatient Medications  Medication Sig Dispense Refill   allopurinol (ZYLOPRIM) 100 MG tablet Take 1 tablet (100 mg total) by mouth daily. 90 tablet 3   aspirin 81 MG tablet Take 81 mg by mouth daily.      bisoprolol (ZEBETA) 10 MG tablet Take 1 tablet (10 mg total) by mouth daily. 90 tablet 4   Cholecalciferol 25 MCG (1000 UT) capsule Take 1,000 Units by mouth daily.     clopidogrel (PLAVIX) 75 MG tablet Take 1 tablet (75 mg total) by mouth daily. 90 tablet 4   fluticasone (FLONASE) 50 MCG/ACT nasal spray Place into both nostrils daily.     HYDROcodone-acetaminophen (NORCO/VICODIN) 5-325 MG tablet Take 1-2 tablets by mouth every 6 (six) hours as needed for moderate pain. 20 tablet 0   ibuprofen (ADVIL) 200 MG tablet Take 400 mg by mouth 2 (two) times daily as needed (headaches).     lidocaine-prilocaine (EMLA) cream Apply on the port. 30 -45 min  prior to port access. 30 g 3   lisinopril-hydrochlorothiazide (ZESTORETIC) 20-12.5 MG tablet Take 1 tablet by mouth daily. 90 tablet 1   OZEMPIC, 1 MG/DOSE, 4 MG/3ML SOPN Inject 1 mg into the skin once a week. 3 mL 4   pantoprazole (PROTONIX) 20 MG tablet Take 1 tablet  (20 mg total) by mouth daily. 30 tablet 1   PROAIR HFA 108 (90 Base) MCG/ACT inhaler Inhale 2 puffs into the lungs every 6 (six) hours as needed for wheezing or shortness of breath. 8.5 g 4   rosuvastatin (CRESTOR) 20 MG tablet Take 1 tablet (20 mg  total) by mouth daily. 90 tablet 1   azelastine (ASTELIN) 0.1 % nasal spray Place 2 sprays into both nostrils 2 (two) times daily. Use in each nostril as directed (Patient not taking: Reported on 02/28/2023) 30 mL 2   cyanocobalamin 1000 MCG tablet Take 1,000 mcg by mouth daily. (Patient not taking: Reported on 02/28/2023)     diazepam (VALIUM) 5 MG tablet Take 1 tablet (5 mg total) by mouth every 12 (twelve) hours as needed. (Patient not taking: Reported on 02/28/2023) 30 tablet 0   gabapentin (NEURONTIN) 300 MG capsule Take 1 capsule (300 mg total) by mouth 3 (three) times daily AND 2 capsules (600 mg total) at bedtime. (Patient not taking: Reported on 03/19/2023)     montelukast (SINGULAIR) 10 MG tablet Take 1 tablet (10 mg total) by mouth at bedtime. (Patient not taking: Reported on 03/19/2023) 30 tablet 3   No current facility-administered medications for this visit.   Facility-Administered Medications Ordered in Other Visits  Medication Dose Route Frequency Provider Last Rate Last Admin   heparin lock flush 100 UNIT/ML injection            heparin lock flush 100 unit/mL  500 Units Intracatheter Once PRN Michaelyn Barter, MD       pembrolizumab [facility]) 200 mg in sodium chloride 0.9 % 50 mL chemo infusion  200 mg Intravenous Once Michaelyn Barter, MD        Mild maculopapular rash on the [place]er area left more than right.  PHYSICAL EXAMINATION: ECOG PERFORMANCE STATUS: 1 - Symptomatic but completely ambulatory  Vitals:   03/19/23 0856  BP: 133/78  Pulse: 75  Resp: 17  Temp: (!) 97.2 F (36.2 C)  SpO2: 97%    Filed Weights   03/19/23 0856  Weight: 211 lb (95.7 kg)     Physical Exam HENT:     Head: Normocephalic and atraumatic.      Mouth/Throat:     Pharynx: No oropharyngeal exudate.  Eyes:     Pupils: Pupils are equal, round, and reactive to light.  Cardiovascular:     Rate and Rhythm: Normal rate and regular rhythm.  Pulmonary:     Comments: Decreased breath sounds bilaterally.  No wheeze or crackles Abdominal:     General: Bowel sounds are normal. There is no distension.     Palpations: Abdomen is soft. There is no mass.     Tenderness: There is no abdominal tenderness. There is no guarding or rebound.  Musculoskeletal:        General: No tenderness. Normal range of motion.     Cervical back: Normal range of motion and neck supple.  Skin:    General: Skin is warm.  Neurological:     Mental Status: She is alert and oriented to person, place, and time.  Psychiatric:        Mood and Affect: Affect normal.      LABORATORY DATA:  I have reviewed the data as listed Lab Results  Component Value Date   WBC 9.4 03/19/2023   HGB 14.4 03/19/2023   HCT 43.5 03/19/2023   MCV 86.0 03/19/2023   PLT 254 03/19/2023   Recent Labs    02/28/23 1114 03/05/23 0947 03/19/23 0838  NA 131* 136 139  K 3.0* 3.7 3.6  CL 98 103 107  CO2 23 27 24   GLUCOSE 103* 106* 98  BUN 17 18 20   CREATININE 0.82 0.64 0.66  CALCIUM 9.0 9.4 9.2  GFRNONAA >60 >60 >60  PROT  7.2 7.0 6.9  ALBUMIN 4.0 3.9 3.8  AST 64* 24 22  ALT 44 27 13  ALKPHOS 118 116 86  BILITOT 1.6* 0.7 0.6    RADIOGRAPHIC STUDIES: I have personally reviewed the radiological images as listed and agreed with the findings in the report. MM 3D SCREEN BREAST BILATERAL  Result Date: 03/06/2023 CLINICAL DATA:  Screening. EXAM: DIGITAL SCREENING BILATERAL MAMMOGRAM WITH TOMOSYNTHESIS AND CAD TECHNIQUE: Bilateral screening digital craniocaudal and mediolateral oblique mammograms were obtained. Bilateral screening digital breast tomosynthesis was performed. The images were evaluated with computer-aided detection. COMPARISON:  Previous exam(s). ACR Breast Density  Category a: The breasts are almost entirely fatty. FINDINGS: There are no findings suspicious for malignancy. IMPRESSION: No mammographic evidence of malignancy. A result letter of this screening mammogram will be mailed directly to the patient. RECOMMENDATION: Screening mammogram in one year. (Code:SM-B-01Y) BI-RADS CATEGORY  1: Negative. Electronically Signed   By: Amie Portland M.D.   On: 03/06/2023 09:07     ASSESSMENT & PLAN:   Primary cancer of left lower lobe of lung (HCC) #JUNE 2023-STAGE IV--recurrent/metastatic disease with New left adrenal metastasis;  PET scan JUNE 20th- 2023-  Enlarged 4 cm hypermetabolic LEFT adrenal metastasis;  Suspicion of metastatic adenopathy to the LEFT hilum.  Part solid nodule in the RIGHT lower lobe without metabolic activity. 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 Currently on single agent Keytruda [PD-L1 greater than 80% ].  Currently on single agent Keytruda.    CT FEB 11th, 2024 [ER]- CT CAP- No acute intrathoracic, abdominal, or pelvic pathology. No aortic aneurysm or dissection;  No evidence of metastatic disease in the chest, abdomen, or pelvis. Several ground-glass/part solid nodular density at the right lung base similar prior CT. see discussion below regarding right ovarian mass.  # Proceed of Keytruda single agent. Labs today reviewed;  acceptable for treatment today. Tolerating well except for mild chills. TSH- March 2024- WNL. stable  # mild Hypokalemia: discussed dietary supplement. stable  # Chronic pain: headaches/cervical pain/back pain [Dr.Naveira; pain doctor]  On NSAIDs [ monitor for now. OCT 2023- MRI brain- NEGATIVE for any metastatic disease.   Stable.  FEB 2024- MRI lumbar spine- degenerative disease- on ibuprofen prn- BID. stable  # Left rib pain-Post throacotomy pain/tingling and numbness:  Continue gabapentin -300 mg TID;  and then at extra at night prn. stable  # CAD/ PVD- cramping- s/p evaluation [Dr.Schneir] dec 2023 stable  # COPD-stable encouraged continue to avoid smoking; again counseled to quit smoking; recommend evaluation with pulmonary. stable  #Right mildly complex cystic adnexal mass noted- s/p prior pelvic ultrasound for further work-up [s/p Gyn-Onc; Dr.Berchuck; felt high risk for surgery] 8.1 x 5.8 cm increased from 6.3 by 4.3 cm. Enlarging septated right ovarian cyst. No metabolic activity was demonstrated on PET-CT of 05/23/2022.  Pelvic ultrasound showed increasing size of the ovarian lesion.  Discussed with gyn-Onc. Recommend evaluation with gyn-Onc; sent messg to [facility].   # IV Access :s/p  port placement- stable.   *AM appts- Monday-   # DISPOSITION:  # keytruda today; # Please order ca 125/draw today #Follow-up in 3 weeks-MD labs/port- cbc/cmp; Keytruda; Dr. B    All questions were answered. The patient knows to call the clinic with any problems, questions or concerns.    Earna Coder, MD 03/19/2023 9:36 AM

## 2023-03-20 LAB — CA 125: Cancer Antigen (CA) 125: 17.2 U/mL (ref 0.0–38.1)

## 2023-03-27 DIAGNOSIS — H25042 Posterior subcapsular polar age-related cataract, left eye: Secondary | ICD-10-CM | POA: Diagnosis not present

## 2023-03-27 DIAGNOSIS — H2513 Age-related nuclear cataract, bilateral: Secondary | ICD-10-CM | POA: Diagnosis not present

## 2023-03-27 DIAGNOSIS — E119 Type 2 diabetes mellitus without complications: Secondary | ICD-10-CM | POA: Diagnosis not present

## 2023-03-27 DIAGNOSIS — H25041 Posterior subcapsular polar age-related cataract, right eye: Secondary | ICD-10-CM | POA: Diagnosis not present

## 2023-03-27 LAB — HM DIABETES EYE EXAM

## 2023-03-28 ENCOUNTER — Encounter: Payer: Self-pay | Admitting: Family Medicine

## 2023-04-09 ENCOUNTER — Inpatient Hospital Stay: Payer: Medicare Other | Attending: Internal Medicine | Admitting: Internal Medicine

## 2023-04-09 ENCOUNTER — Encounter: Payer: Self-pay | Admitting: Internal Medicine

## 2023-04-09 ENCOUNTER — Inpatient Hospital Stay: Payer: Medicare Other

## 2023-04-09 VITALS — BP 116/81 | HR 76 | Temp 97.5°F | Ht 68.0 in | Wt 210.6 lb

## 2023-04-09 DIAGNOSIS — Z5112 Encounter for antineoplastic immunotherapy: Secondary | ICD-10-CM | POA: Diagnosis not present

## 2023-04-09 DIAGNOSIS — C7972 Secondary malignant neoplasm of left adrenal gland: Secondary | ICD-10-CM | POA: Diagnosis not present

## 2023-04-09 DIAGNOSIS — C3432 Malignant neoplasm of lower lobe, left bronchus or lung: Secondary | ICD-10-CM | POA: Diagnosis not present

## 2023-04-09 DIAGNOSIS — Z79899 Other long term (current) drug therapy: Secondary | ICD-10-CM | POA: Diagnosis not present

## 2023-04-09 DIAGNOSIS — Z87891 Personal history of nicotine dependence: Secondary | ICD-10-CM | POA: Insufficient documentation

## 2023-04-09 DIAGNOSIS — Z79620 Long term (current) use of immunosuppressive biologic: Secondary | ICD-10-CM | POA: Diagnosis not present

## 2023-04-09 LAB — CBC WITH DIFFERENTIAL/PLATELET
Abs Immature Granulocytes: 0.01 10*3/uL (ref 0.00–0.07)
Basophils Absolute: 0.1 10*3/uL (ref 0.0–0.1)
Basophils Relative: 1 %
Eosinophils Absolute: 0.4 10*3/uL (ref 0.0–0.5)
Eosinophils Relative: 5 %
HCT: 43.1 % (ref 36.0–46.0)
Hemoglobin: 14.3 g/dL (ref 12.0–15.0)
Immature Granulocytes: 0 %
Lymphocytes Relative: 35 %
Lymphs Abs: 2.6 10*3/uL (ref 0.7–4.0)
MCH: 28.1 pg (ref 26.0–34.0)
MCHC: 33.2 g/dL (ref 30.0–36.0)
MCV: 84.7 fL (ref 80.0–100.0)
Monocytes Absolute: 0.6 10*3/uL (ref 0.1–1.0)
Monocytes Relative: 7 %
Neutro Abs: 3.9 10*3/uL (ref 1.7–7.7)
Neutrophils Relative %: 52 %
Platelets: 211 10*3/uL (ref 150–400)
RBC: 5.09 MIL/uL (ref 3.87–5.11)
RDW: 14 % (ref 11.5–15.5)
WBC: 7.5 10*3/uL (ref 4.0–10.5)
nRBC: 0 % (ref 0.0–0.2)

## 2023-04-09 LAB — COMPREHENSIVE METABOLIC PANEL
ALT: 12 U/L (ref 0–44)
AST: 20 U/L (ref 15–41)
Albumin: 3.8 g/dL (ref 3.5–5.0)
Alkaline Phosphatase: 88 U/L (ref 38–126)
Anion gap: 10 (ref 5–15)
BUN: 20 mg/dL (ref 8–23)
CO2: 24 mmol/L (ref 22–32)
Calcium: 9.4 mg/dL (ref 8.9–10.3)
Chloride: 104 mmol/L (ref 98–111)
Creatinine, Ser: 0.72 mg/dL (ref 0.44–1.00)
GFR, Estimated: >60 mL/min (ref >60)
Glucose, Bld: 105 mg/dL — ABNORMAL HIGH (ref 70–99)
Potassium: 3.2 mmol/L — ABNORMAL LOW (ref 3.5–5.1)
Sodium: 138 mmol/L (ref 135–145)
Total Bilirubin: 0.6 mg/dL (ref 0.3–1.2)
Total Protein: 6.7 g/dL (ref 6.5–8.1)

## 2023-04-09 MED ORDER — SODIUM CHLORIDE 0.9 % IV SOLN
200.0000 mg | Freq: Once | INTRAVENOUS | Status: AC
  Administered 2023-04-09: 200 mg via INTRAVENOUS
  Filled 2023-04-09: qty 8

## 2023-04-09 MED ORDER — SODIUM CHLORIDE 0.9 % IV SOLN
Freq: Once | INTRAVENOUS | Status: AC
  Filled 2023-04-09: qty 250

## 2023-04-09 MED ORDER — HEPARIN SOD (PORK) LOCK FLUSH 100 UNIT/ML IV SOLN
500.0000 [IU] | Freq: Once | INTRAVENOUS | Status: AC | PRN
  Administered 2023-04-09: 500 [IU]
  Filled 2023-04-09: qty 5

## 2023-04-09 NOTE — Progress Notes (Signed)
No concerns today 

## 2023-04-09 NOTE — Patient Instructions (Signed)
[facility]  Discharge Instructions: Thank you for choosing [facility] to provide your oncology and hematology care.  If you have a lab appointment with the Cancer Center, please go directly to the Cancer Center and check in at the registration area.  Wear comfortable clothing and clothing appropriate for easy access to any Portacath or PICC line.   We strive to give you quality time with your provider. You may need to reschedule your appointment if you arrive late (15 or more minutes).  Arriving late affects you and other patients whose appointments are after yours.  Also, if you miss three or more appointments without notifying the office, you may be dismissed from the clinic at the provider's discretion.      For prescription refill requests, have your pharmacy contact our office and allow 72 hours for refills to be completed.    Today you received the following chemotherapy and/or immunotherapy agents KEYTRUDA      To help prevent nausea and vomiting after your treatment, we encourage you to take your nausea medication as directed.  BELOW ARE SYMPTOMS THAT SHOULD BE REPORTED IMMEDIATELY: *FEVER GREATER THAN 100.4 F (38 C) OR HIGHER *CHILLS OR SWEATING *NAUSEA AND VOMITING THAT IS NOT CONTROLLED WITH YOUR NAUSEA MEDICATION *UNUSUAL SHORTNESS OF BREATH *UNUSUAL BRUISING OR BLEEDING *URINARY PROBLEMS (pain or burning when urinating, or frequent urination) *BOWEL PROBLEMS (unusual diarrhea, constipation, pain near the anus) TENDERNESS IN MOUTH AND THROAT WITH OR WITHOUT PRESENCE OF ULCERS (sore throat, sores in mouth, or a toothache) UNUSUAL RASH, SWELLING OR PAIN  UNUSUAL VAGINAL DISCHARGE OR ITCHING   Items with * indicate a potential emergency and should be followed up as soon as possible or go to the Emergency Department if any problems should occur.  Please show the CHEMOTHERAPY ALERT CARD or IMMUNOTHERAPY ALERT CARD at check-in to  the Emergency Department and triage nurse.  Should you have questions after your visit or need to cancel or reschedule your appointment, please contact [facility]  [phone] and follow the prompts.  Office hours are 8:00 a.m. to 4:30 p.m. Monday - Friday. Please note that voicemails left after 4:00 p.m. may not be returned until the following business day.  We are closed weekends and major holidays. You have access to a nurse at all times for urgent questions. Please call the main number to the clinic [phone] and follow the prompts.  For any non-urgent questions, you may also contact your provider using MyChart. We now offer e-Visits for anyone 18 and older to request care online for non-urgent symptoms. For details visit [email or web address].   Also download the MyChart app! Go to the app store, search "MyChart", open the app, select [facility], and log in with your MyChart username and password. . Pembrolizumab Injection What is this medication? PEMBROLIZUMAB (PEM broe LIZ ue mab) treats some types of cancer. It works by helping your immune system slow or stop the spread of cancer cells. It is a monoclonal antibody. This medicine may be used for other purposes; ask your health care provider or pharmacist if you have questions. COMMON BRAND NAME(S): Keytruda What should I tell my care team before I take this medication? They need to know if you have any of these conditions: Allogeneic stem cell transplant (uses someone else's stem cells) Autoimmune diseases, such as Crohn disease, ulcerative colitis, lupus History of chest radiation Nervous system problems, such as Guillain-Barre syndrome, myasthenia gravis   Organ transplant An unusual or allergic reaction to pembrolizumab, other medications, foods, dyes, or preservatives Pregnant or trying to get pregnant Breast-feeding How should I use this medication? This medication is injected into a vein.  It is given by your care team in a hospital or clinic setting. A special MedGuide will be given to you before each treatment. Be sure to read this information carefully each time. Talk to your care team about the use of this medication in children. While it may be prescribed for children as young as 6 months for selected conditions, precautions do apply. Overdosage: If you think you have taken too much of this medicine contact a poison control center or emergency room at once. NOTE: This medicine is only for you. Do not share this medicine with others. What if I miss a dose? Keep appointments for follow-up doses. It is important not to miss your dose. Call your care team if you are unable to keep an appointment. What may interact with this medication? Interactions have not been studied. This list may not describe all possible interactions. Give your health care provider a list of all the medicines, herbs, non-prescription drugs, or dietary supplements you use. Also tell them if you smoke, drink alcohol, or use illegal drugs. Some items may interact with your medicine. What should I watch for while using this medication? Your condition will be monitored carefully while you are receiving this medication. You may need blood work while taking this medication. This medication may cause serious skin reactions. They can happen weeks to months after starting the medication. Contact your care team right away if you notice fevers or flu-like symptoms with a rash. The rash may be red or purple and then turn into blisters or peeling of the skin. You may also notice a red rash with swelling of the face, lips, or lymph nodes in your neck or under your arms. Tell your care team right away if you have any change in your eyesight. Talk to your care team if you may be pregnant. Serious birth defects can occur if you take this medication during pregnancy and for 4 months after the last dose. You will need a negative  pregnancy test before starting this medication. Contraception is recommended while taking this medication and for 4 months after the last dose. Your care team can help you find the option that works for you. Do not breastfeed while taking this medication and for 4 months after the last dose. What side effects may I notice from receiving this medication? Side effects that you should report to your care team as soon as possible: Allergic reactions--skin rash, itching, hives, swelling of the face, lips, tongue, or throat Dry cough, shortness of breath or trouble breathing Eye pain, redness, irritation, or discharge with blurry or decreased vision Heart muscle inflammation--unusual weakness or fatigue, shortness of breath, chest pain, fast or irregular heartbeat, dizziness, swelling of the ankles, feet, or hands Hormone gland problems--headache, sensitivity to light, unusual weakness or fatigue, dizziness, fast or irregular heartbeat, increased sensitivity to cold or heat, excessive sweating, constipation, hair loss, increased thirst or amount of urine, tremors or shaking, irritability Infusion reactions--chest pain, shortness of breath or trouble breathing, feeling faint or lightheaded Kidney injury (glomerulonephritis)--decrease in the amount of urine, red or dark brown urine, foamy or bubbly urine, swelling of the ankles, hands, or feet Liver injury--right upper belly pain, loss of appetite, nausea, light-colored stool, dark yellow or brown urine, yellowing skin or eyes, unusual weakness   or fatigue Pain, tingling, or numbness in the hands or feet, muscle weakness, change in vision, confusion or trouble speaking, loss of balance or coordination, trouble walking, seizures Rash, fever, and swollen lymph nodes Redness, blistering, peeling, or loosening of the skin, including inside the mouth Sudden or severe stomach pain, bloody diarrhea, fever, nausea, vomiting Side effects that usually do not require  medical attention (report to your care team if they continue or are bothersome): Bone, joint, or muscle pain Diarrhea Fatigue Loss of appetite Nausea Skin rash This list may not describe all possible side effects. Call your doctor for medical advice about side effects. You may report side effects to FDA at 1-800-FDA-1088. Where should I keep my medication? This medication is given in a hospital or clinic. It will not be stored at home. NOTE: This sheet is a summary. It may not cover all possible information. If you have questions about this medicine, talk to your doctor, pharmacist, or health care provider.  2023 Elsevier/Gold Standard (2022-04-04 00:00:00)    

## 2023-04-09 NOTE — Assessment & Plan Note (Addendum)
#  JUNE 2023-STAGE IV--recurrent/metastatic disease with New left adrenal metastasis;  PET scan JUNE 20th- 2023-  Enlarged 4 cm hypermetabolic LEFT adrenal metastasis;  Suspicion of metastatic adenopathy to the LEFT hilum.  Part solid nodule in the RIGHT lower lobe without metabolic activity. 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 Currently on single agent Keytruda [PD-L1 greater than 80% ].  Currently on single agent Keytruda.    CT FEB 11th, 2024 [ER]- CT CAP- No acute intrathoracic, abdominal, or pelvic pathology. No aortic aneurysm or dissection;  No evidence of metastatic disease in the chest, abdomen, or pelvis. Several ground-glass/part solid nodular density at the right lung base similar prior CT. see discussion below regarding right ovarian mass.will order scan at next visit.   # Proceed of Keytruda single agent. Labs today reviewed;  acceptable for treatment today. Tolerating well except for mild chills. TSH- March 2024- WNL. stable  # mild Hypokalemia: discussed dietary supplement. stable  # Chronic pain: headaches/cervical pain/back pain [Dr.Naveira; pain doctor]  On NSAIDs [ monitor for now. OCT 2023- MRI brain- NEGATIVE for any metastatic disease.   Stable.  FEB 2024- MRI lumbar spine- degenerative disease- on ibuprofen prn- BID. stable  # Left rib pain-Post throacotomy pain/tingling and numbness:  Continue gabapentin -300 mg TID; and then at extra at night prn. stable  # CAD/ PVD- cramping- s/p evaluation [Dr.Schneir] dec 2023 stable  # COPD-stable encouraged continue to avoid smoking; again counseled to quit smoking; recommend evaluation with pulmonary. stable  #Right mildly complex cystic adnexal mass noted- s/p prior pelvic ultrasound for further work-up [s/p Gyn-Onc; Dr.Berchuck; felt high risk for surgery] 8.1 x 5.8 cm increased from 6.3 by 4.3 cm. Enlarging septated  right ovarian cyst. No metabolic activity was demonstrated on PET-CT of 05/23/2022.  Pelvic ultrasound showed increasing size of the ovarian lesion.  Discussed with gyn-Onc. Recommend evaluation with gyn-Onc- on 5/29.   # Left eye cataract - in June, 2024-   # IV Access :s/p  port placement- stable  *AM appts- Monday-   # DISPOSITION:  # keytruda today; #Follow-up in 3 weeks-MD labs/port- cbc/cmp; Keytruda; Dr. Leonard Schwartz

## 2023-04-09 NOTE — Progress Notes (Signed)
[facility] CONSULT NOTE  Patient Care Team: Malva Limes, MD as PCP - General (Family Medicine) Lady Gary Darlin Priestly, MD as Consulting Physician (Cardiology) Schnier, Latina Craver, MD (Vascular Surgery) Lonell Face, MD as Consulting Physician (Neurology) Delano Metz, MD as Referring Physician (Pain Medicine) Pa, [facility] (Optometry) Lady Gary, Darlin Priestly, MD as Consulting Physician (Cardiology) Pasty Spillers, MD (Inactive) as Consulting Physician (Gastroenterology) Nadara Mustard, MD as Referring Physician (Obstetrics and Gynecology) Glory Buff, RN as Oncology Nurse Navigator Earna Coder, MD as Consulting Physician (Oncology)   CHIEF COMPLAINTS/PURPOSE OF CONSULTATION: lung cancer  #  Oncology History Overview Note  #MAY 2022- LLL nodule 2.3 cm Adenocarcinoma Stage IA; s/p lobectomy.  No adjuvant therapy  # CT scan 4th June 2023-highly concerning for recurrent/metastatic disease with-. New left adrenal metastasis;  Ground-glass and part solid nodules in the peripheral right lower lobe, unchanged from 11/07/2021 but slightly enlarged from 01/01/2018. Findings are suspicious for indolent adenocarcinoma.  F One- PFL1 =80%; KRAS G12C PET scan JUNE 20th- 2023-  Enlarged 4 cm hypermetabolic LEFT adrenal metastasis;  Suspicion of metastatic adenopathy to the LEFT hilum.  Part solid nodule in the RIGHT lower lobe without metabolic activity.   3 ADRENAL BIOPSY- CK7 POSITIVE ADENOCARCINOMA- There is limited tissue remaining for ancillary testing, not likely  sufficient for NGS testing.   # AUG 7th, 2023-single agent Keytruda.    Primary cancer of left lower lobe of lung (HCC)  05/09/2021 Initial Diagnosis   Primary cancer of left lower lobe of lung (HCC)   07/04/2022 -  Chemotherapy   Patient is on Treatment Plan : LUNG NSCLC Pembrolizumab (200) q21d     07/10/2022 - 07/10/2022 Chemotherapy   Patient is on Treatment Plan : LUNG NSCLC Pembrolizumab  (200) q21d        HISTORY OF PRESENTING ILLNESS: Patient  ambulating.  Alone.   Laura Nielsen 67 y.o.  female history of smoking; prior history of lung cancer-with likely recurrence to left adrenal gland [s/p Bx CK-7 positive TTF-1 negative]-clinically suggestive of recurrent lung cancer on single agent Rande Lawman is here for follow-up.  Mild fatigue. Complains of poor vision of left eye. Awaiting cataract surgery.   Patient continues to have concerns of chronic constipation and headaches.  Denies any neck pain.   Denies any abdominal pain. Otherwise patient continues to have mild shortness of breath on exertion.  Denies any nausea vomiting. Complains of cramping in the legs chronic.   Review of Systems  Constitutional:  Negative for chills, diaphoresis, fever, malaise/fatigue and weight loss.  HENT:  Negative for nosebleeds and sore throat.   Eyes:  Negative for double vision.  Respiratory:  Negative for cough, hemoptysis, sputum production, shortness of breath and wheezing.   Cardiovascular:  Negative for palpitations, orthopnea and leg swelling.  Gastrointestinal:  Negative for abdominal pain, blood in stool, constipation, diarrhea, heartburn, melena, nausea and vomiting.  Genitourinary:  Negative for dysuria, frequency and urgency.  Musculoskeletal:  Positive for back pain and joint pain.  Skin: Negative.  Negative for itching and rash.  Neurological:  Positive for tingling. Negative for dizziness, focal weakness, weakness and headaches.  Endo/Heme/Allergies:  Does not bruise/bleed easily.  Psychiatric/Behavioral:  Negative for depression. The patient is not nervous/anxious and does not have insomnia.      MEDICAL HISTORY:  Past Medical History:  Diagnosis Date   AAA (abdominal aortic aneurysm) (HCC)    s/p EVAR AAA 03/27/13   Arthritis  Asthma    Cancer (HCC)    Complication of anesthesia    BP drops after   History of kidney stones    Osteoporosis    Sleep apnea      SURGICAL HISTORY: Past Surgical History:  Procedure Laterality Date   ABDOMINAL AORTIC ENDOVASCULAR STENT GRAFT  03/27/2013   Dr. Levora Dredge   Cardiac catheterization  02/2009   70-80% stenosis RCA stent placed. started on Plavix   CARDIAC CATHETERIZATION     COLONOSCOPY WITH PROPOFOL N/A 01/05/2021   Procedure: COLONOSCOPY WITH PROPOFOL;  Surgeon: Pasty Spillers, MD;  Location: [facility] ENDOSCOPY;  Service: Endoscopy;  Laterality: N/A;   CORONARY ANGIOPLASTY     stent placed   ESOPHAGOGASTRODUODENOSCOPY (EGD) WITH PROPOFOL N/A 01/05/2021   Procedure: ESOPHAGOGASTRODUODENOSCOPY (EGD) WITH PROPOFOL;  Surgeon: Pasty Spillers, MD;  Location: [facility] ENDOSCOPY;  Service: Endoscopy;  Laterality: N/A;   INTERCOSTAL NERVE BLOCK Left 04/06/2021   Procedure: INTERCOSTAL NERVE BLOCK;  Surgeon: Loreli Slot, MD;  Location: [facility] OR;  Service: Thoracic;  Laterality: Left;   IR IMAGING GUIDED PORT INSERTION  06/20/2022   KIDNEY STONE SURGERY  1999   LUNG LOBECTOMY Left 04/06/2021   robotic left lower lobectomy 04/06/2021 Dr. Dorris Fetch for Stage 1A adenocarcinoma   NODE DISSECTION Left 04/06/2021   Procedure: NODE DISSECTION;  Surgeon: Loreli Slot, MD;  Location: [facility] OR;  Service: Thoracic;  Laterality: Left;   TUBAL LIGATION     VAGINAL HYSTERECTOMY     Menometrorrhagia. Excessive bleeding. Unknown if cervix removed.     SOCIAL HISTORY: Social History   Socioeconomic History   Marital status: Divorced    Spouse name: Not on file   Number of children: 2   Years of education: H/S   Highest education level: High school graduate  Occupational History   Occupation: Disabled   Occupation: retired  Tobacco Use   Smoking status: Former    Packs/day: 0.25    Years: 44.50    Additional pack years: 0.00    Total pack years: 11.13    Types: Cigarettes    Quit date: 04/05/2021    Years since quitting: 2.0   Smokeless tobacco: Never   Tobacco comments:    12/23/19  states she quit for 3 months and then started back. Pt is going to talk with MD about this.  Vaping Use   Vaping Use: Never used  Substance and Sexual Activity   Alcohol use: Yes    Comment: rarely - once a year   Drug use: No   Sexual activity: Not on file  Other Topics Concern   Not on file  Social History Narrative   Hx of smoking; quit prior to lung surgery. Lives in [place]- self. No alcohol. Used to work in [employer], [employer]- Facilities manager. On disability sec to spinal pain.    Social Determinants of Health   Financial Resource Strain: Medium Risk (02/16/2023)   Overall Financial Resource Strain (CARDIA)    Difficulty of Paying Living Expenses: Somewhat hard  Food Insecurity: Patient Declined (02/16/2023)   Hunger Vital Sign    Worried About Running Out of Food in the Last Year: Patient declined    Ran Out of Food in the Last Year: Patient declined  Transportation Needs: No Transportation Needs (02/16/2023)   PRAPARE - Administrator, Civil Service (Medical): No    Lack of Transportation (Non-Medical): No  Physical Activity: Unknown (02/16/2023)   Exercise Vital Sign    Days of  Exercise per Week: 0 days    Minutes of Exercise per Session: Patient declined  Stress: Stress Concern Present (02/16/2023)   [employer] of Occupational Health - Occupational Stress Questionnaire    Feeling of Stress : Rather much  Social Connections: Unknown (02/16/2023)   Social Connection and Isolation Panel [NHANES]    Frequency of Communication with Friends and Family: More than three times a week    Frequency of Social Gatherings with Friends and Family: Once a week    Attends Religious Services: Not on Insurance claims handler of Clubs or Organizations: No    Attends Banker Meetings: Never    Marital Status: Patient declined  Intimate Partner Violence: Not At Risk (02/20/2023)   Humiliation, Afraid, Rape, and Kick questionnaire    Fear of Current or Ex-Partner: No     Emotionally Abused: No    Physically Abused: No    Sexually Abused: No    FAMILY HISTORY: Family History  Problem Relation Age of Onset   Hypertension Mother    Coronary artery disease Mother    Heart attack Mother        acute   Cancer Mother    Alcohol abuse Father    Depression Father    Hypertension Father    Heart attack Father 64       acute   Alcohol abuse Sister    Hyperlipidemia Sister    Hypertension Sister    Cancer Sister 41   Heart attack Sister        x's 2   Coronary artery disease Sister 2       x's 2   Breast cancer Sister 54    ALLERGIES:  is allergic to atorvastatin, omeprazole, and bupropion.  MEDICATIONS:  Current Outpatient Medications  Medication Sig Dispense Refill   allopurinol (ZYLOPRIM) 100 MG tablet Take 1 tablet (100 mg total) by mouth daily. 90 tablet 3   aspirin 81 MG tablet Take 81 mg by mouth daily.      bisoprolol (ZEBETA) 10 MG tablet Take 1 tablet (10 mg total) by mouth daily. 90 tablet 4   Cholecalciferol 25 MCG (1000 UT) capsule Take 1,000 Units by mouth daily.     clopidogrel (PLAVIX) 75 MG tablet Take 1 tablet (75 mg total) by mouth daily. 90 tablet 4   fluticasone (FLONASE) 50 MCG/ACT nasal spray Place into both nostrils daily.     HYDROcodone-acetaminophen (NORCO/VICODIN) 5-325 MG tablet Take 1-2 tablets by mouth every 6 (six) hours as needed for moderate pain. 20 tablet 0   ibuprofen (ADVIL) 200 MG tablet Take 400 mg by mouth 2 (two) times daily as needed (headaches).     lidocaine-prilocaine (EMLA) cream Apply on the port. 30 -45 min  prior to port access. 30 g 3   lisinopril-hydrochlorothiazide (ZESTORETIC) 20-12.5 MG tablet Take 1 tablet by mouth daily. 90 tablet 1   pantoprazole (PROTONIX) 20 MG tablet Take 1 tablet (20 mg total) by mouth daily. 90 tablet 1   PROAIR HFA 108 (90 Base) MCG/ACT inhaler Inhale 2 puffs into the lungs every 6 (six) hours as needed for wheezing or shortness of breath. 8.5 g 4   rosuvastatin  (CRESTOR) 20 MG tablet Take 1 tablet (20 mg total) by mouth daily. 90 tablet 1   Semaglutide, 1 MG/DOSE, (OZEMPIC, 1 MG/DOSE,) 4 MG/3ML SOPN Inject 1 mg into the skin once a week. 9 mL 1   azelastine (ASTELIN) 0.1 % nasal spray Place 2  sprays into both nostrils 2 (two) times daily. Use in each nostril as directed (Patient not taking: Reported on 02/28/2023) 30 mL 2   cyanocobalamin 1000 MCG tablet Take 1,000 mcg by mouth daily. (Patient not taking: Reported on 02/28/2023)     diazepam (VALIUM) 5 MG tablet Take 1 tablet (5 mg total) by mouth every 12 (twelve) hours as needed. (Patient not taking: Reported on 02/28/2023) 30 tablet 0   gabapentin (NEURONTIN) 300 MG capsule Take 1 capsule (300 mg total) by mouth 3 (three) times daily AND 2 capsules (600 mg total) at bedtime. (Patient not taking: Reported on 03/19/2023)     montelukast (SINGULAIR) 10 MG tablet Take 1 tablet (10 mg total) by mouth at bedtime. (Patient not taking: Reported on 03/19/2023) 30 tablet 3   No current facility-administered medications for this visit.   Facility-Administered Medications Ordered in Other Visits  Medication Dose Route Frequency Provider Last Rate Last Admin   0.9 %  sodium chloride infusion   Intravenous Once Louretta Shorten R, MD       heparin lock flush 100 UNIT/ML injection            heparin lock flush 100 unit/mL  500 Units Intracatheter Once PRN Earna Coder, MD       pembrolizumab [facility]) 200 mg in sodium chloride 0.9 % 50 mL chemo infusion  200 mg Intravenous Once Earna Coder, MD        Mild maculopapular rash on the Maller area left more than right.  PHYSICAL EXAMINATION: ECOG PERFORMANCE STATUS: 1 - Symptomatic but completely ambulatory  Vitals:   04/09/23 0828  BP: 116/81  Pulse: 76  Temp: (!) 97.5 F (36.4 C)  SpO2: 100%    Filed Weights   04/09/23 0828  Weight: 210 lb 9.6 oz (95.5 kg)     Physical Exam HENT:     Head: Normocephalic and atraumatic.      Mouth/Throat:     Pharynx: No oropharyngeal exudate.  Eyes:     Pupils: Pupils are equal, round, and reactive to light.  Cardiovascular:     Rate and Rhythm: Normal rate and regular rhythm.  Pulmonary:     Comments: Decreased breath sounds bilaterally.  No wheeze or crackles Abdominal:     General: Bowel sounds are normal. There is no distension.     Palpations: Abdomen is soft. There is no mass.     Tenderness: There is no abdominal tenderness. There is no guarding or rebound.  Musculoskeletal:        General: No tenderness. Normal range of motion.     Cervical back: Normal range of motion and neck supple.  Skin:    General: Skin is warm.  Neurological:     Mental Status: She is alert and oriented to person, place, and time.  Psychiatric:        Mood and Affect: Affect normal.      LABORATORY DATA:  I have reviewed the data as listed Lab Results  Component Value Date   WBC 7.5 04/09/2023   HGB 14.3 04/09/2023   HCT 43.1 04/09/2023   MCV 84.7 04/09/2023   PLT 211 04/09/2023   Recent Labs    03/05/23 0947 03/19/23 0838 04/09/23 0823  NA 136 139 138  K 3.7 3.6 3.2*  CL 103 107 104  CO2 27 24 24   GLUCOSE 106* 98 105*  BUN 18 20 20   CREATININE 0.64 0.66 0.72  CALCIUM 9.4 9.2 9.4  GFRNONAA >60 >  60 >60  PROT 7.0 6.9 6.7  ALBUMIN 3.9 3.8 3.8  AST 24 22 20   ALT 27 13 12   ALKPHOS 116 86 88  BILITOT 0.7 0.6 0.6    RADIOGRAPHIC STUDIES: I have personally reviewed the radiological images as listed and agreed with the findings in the report. No results found.   ASSESSMENT & PLAN:   Primary cancer of left lower lobe of lung (HCC) #JUNE 2023-STAGE IV--recurrent/metastatic disease with New left adrenal metastasis;  PET scan JUNE 20th- 2023-  Enlarged 4 cm hypermetabolic LEFT adrenal metastasis;  Suspicion of metastatic adenopathy to the LEFT hilum.  Part solid nodule in the RIGHT lower lobe without metabolic activity. Foundation One- PLD1 =80% [primary LUNG mass];   JULY 2023- S/p Biopsy of adrenal nodule- Biopsied POSITIVE for CK7 positive adenocarcinoma [QNS for NGS]. Currently on single agent Keytruda [PD-L1 greater than 80% ].  Currently on single agent Keytruda.    CT FEB 11th, 2024 [ER]- CT CAP- No acute intrathoracic, abdominal, or pelvic pathology. No aortic aneurysm or dissection;  No evidence of metastatic disease in the chest, abdomen, or pelvis. Several ground-glass/part solid nodular density at the right lung base similar prior CT. see discussion below regarding right ovarian mass.will order scan at next visit.   # Proceed of Keytruda single agent. Labs today reviewed;  acceptable for treatment today. Tolerating well except for mild chills. TSH- March 2024- WNL. stable  # mild Hypokalemia: discussed dietary supplement. stable  # Chronic pain: headaches/cervical pain/back pain [Dr.Naveira; pain doctor]  On NSAIDs [ monitor for now. OCT 2023- MRI brain- NEGATIVE for any metastatic disease.   Stable.  FEB 2024- MRI lumbar spine- degenerative disease- on ibuprofen prn- BID. stable  # Left rib pain-Post throacotomy pain/tingling and numbness:  Continue gabapentin -300 mg TID; and then at extra at night prn. stable  # CAD/ PVD- cramping- s/p evaluation [Dr.Schneir] dec 2023 stable  # COPD-stable encouraged continue to avoid smoking; again counseled to quit smoking; recommend evaluation with pulmonary. stable  #Right mildly complex cystic adnexal mass noted- s/p prior pelvic ultrasound for further work-up [s/p Gyn-Onc; Dr.Berchuck; felt high risk for surgery] 8.1 x 5.8 cm increased from 6.3 by 4.3 cm. Enlarging septated right ovarian cyst. No metabolic activity was demonstrated on PET-CT of 05/23/2022.  Pelvic ultrasound showed increasing size of the ovarian lesion.  Discussed with gyn-Onc. Recommend evaluation with gyn-Onc- on 5/29.   # Left eye cataract - in June, 2024-   # IV Access :s/p  port placement- stable  *AM appts- Monday-   #  DISPOSITION:  # keytruda today; #Follow-up in 3 weeks-MD labs/port- cbc/cmp; Keytruda; Dr. B    All questions were answered. The patient knows to call the clinic with any problems, questions or concerns.    Earna Coder, MD 04/09/2023 9:23 AM

## 2023-04-10 DIAGNOSIS — I714 Abdominal aortic aneurysm, without rupture, unspecified: Secondary | ICD-10-CM | POA: Diagnosis not present

## 2023-04-10 DIAGNOSIS — Z720 Tobacco use: Secondary | ICD-10-CM | POA: Diagnosis not present

## 2023-04-10 DIAGNOSIS — I2510 Atherosclerotic heart disease of native coronary artery without angina pectoris: Secondary | ICD-10-CM | POA: Diagnosis not present

## 2023-04-10 DIAGNOSIS — I10 Essential (primary) hypertension: Secondary | ICD-10-CM | POA: Diagnosis not present

## 2023-04-10 DIAGNOSIS — J439 Emphysema, unspecified: Secondary | ICD-10-CM | POA: Diagnosis not present

## 2023-04-10 DIAGNOSIS — E782 Mixed hyperlipidemia: Secondary | ICD-10-CM | POA: Diagnosis not present

## 2023-04-14 ENCOUNTER — Other Ambulatory Visit: Payer: Self-pay | Admitting: Family Medicine

## 2023-04-14 DIAGNOSIS — I10 Essential (primary) hypertension: Secondary | ICD-10-CM

## 2023-05-01 ENCOUNTER — Inpatient Hospital Stay: Payer: Medicare Other

## 2023-05-01 ENCOUNTER — Encounter: Payer: Self-pay | Admitting: Internal Medicine

## 2023-05-01 ENCOUNTER — Inpatient Hospital Stay (HOSPITAL_BASED_OUTPATIENT_CLINIC_OR_DEPARTMENT_OTHER): Payer: Medicare Other | Admitting: Internal Medicine

## 2023-05-01 VITALS — BP 116/71 | HR 77 | Temp 97.3°F | Ht 68.0 in | Wt 206.6 lb

## 2023-05-01 DIAGNOSIS — Z79620 Long term (current) use of immunosuppressive biologic: Secondary | ICD-10-CM | POA: Diagnosis not present

## 2023-05-01 DIAGNOSIS — C3432 Malignant neoplasm of lower lobe, left bronchus or lung: Secondary | ICD-10-CM

## 2023-05-01 DIAGNOSIS — Z79899 Other long term (current) drug therapy: Secondary | ICD-10-CM | POA: Diagnosis not present

## 2023-05-01 DIAGNOSIS — Z5112 Encounter for antineoplastic immunotherapy: Secondary | ICD-10-CM | POA: Diagnosis not present

## 2023-05-01 DIAGNOSIS — Z87891 Personal history of nicotine dependence: Secondary | ICD-10-CM | POA: Diagnosis not present

## 2023-05-01 DIAGNOSIS — C7972 Secondary malignant neoplasm of left adrenal gland: Secondary | ICD-10-CM | POA: Diagnosis not present

## 2023-05-01 LAB — COMPREHENSIVE METABOLIC PANEL
ALT: 15 U/L (ref 0–44)
AST: 22 U/L (ref 15–41)
Albumin: 4.1 g/dL (ref 3.5–5.0)
Alkaline Phosphatase: 87 U/L (ref 38–126)
Anion gap: 10 (ref 5–15)
BUN: 19 mg/dL (ref 8–23)
CO2: 25 mmol/L (ref 22–32)
Calcium: 9.7 mg/dL (ref 8.9–10.3)
Chloride: 102 mmol/L (ref 98–111)
Creatinine, Ser: 0.65 mg/dL (ref 0.44–1.00)
GFR, Estimated: >60 mL/min (ref >60)
Glucose, Bld: 105 mg/dL — ABNORMAL HIGH (ref 70–99)
Potassium: 3.5 mmol/L (ref 3.5–5.1)
Sodium: 137 mmol/L (ref 135–145)
Total Bilirubin: 0.6 mg/dL (ref 0.3–1.2)
Total Protein: 7.5 g/dL (ref 6.5–8.1)

## 2023-05-01 LAB — CBC WITH DIFFERENTIAL/PLATELET
Abs Immature Granulocytes: 0.04 10*3/uL (ref 0.00–0.07)
Basophils Absolute: 0.1 10*3/uL (ref 0.0–0.1)
Basophils Relative: 1 %
Eosinophils Absolute: 0.4 10*3/uL (ref 0.0–0.5)
Eosinophils Relative: 4 %
HCT: 44.2 % (ref 36.0–46.0)
Hemoglobin: 15 g/dL (ref 12.0–15.0)
Immature Granulocytes: 0 %
Lymphocytes Relative: 32 %
Lymphs Abs: 3.2 10*3/uL (ref 0.7–4.0)
MCH: 28.6 pg (ref 26.0–34.0)
MCHC: 33.9 g/dL (ref 30.0–36.0)
MCV: 84.4 fL (ref 80.0–100.0)
Monocytes Absolute: 0.7 10*3/uL (ref 0.1–1.0)
Monocytes Relative: 7 %
Neutro Abs: 5.7 10*3/uL (ref 1.7–7.7)
Neutrophils Relative %: 56 %
Platelets: 214 10*3/uL (ref 150–400)
RBC: 5.24 MIL/uL — ABNORMAL HIGH (ref 3.87–5.11)
RDW: 14 % (ref 11.5–15.5)
WBC: 10.1 10*3/uL (ref 4.0–10.5)
nRBC: 0 % (ref 0.0–0.2)

## 2023-05-01 LAB — TSH: TSH: 1.962 u[IU]/mL (ref 0.350–4.500)

## 2023-05-01 MED ORDER — SODIUM CHLORIDE 0.9% FLUSH
10.0000 mL | INTRAVENOUS | Status: DC | PRN
  Administered 2023-05-01: 10 mL
  Filled 2023-05-01: qty 10

## 2023-05-01 MED ORDER — HEPARIN SOD (PORK) LOCK FLUSH 100 UNIT/ML IV SOLN
500.0000 [IU] | Freq: Once | INTRAVENOUS | Status: AC | PRN
  Administered 2023-05-01: 500 [IU]
  Filled 2023-05-01: qty 5

## 2023-05-01 MED ORDER — SODIUM CHLORIDE 0.9 % IV SOLN
Freq: Once | INTRAVENOUS | Status: AC
  Filled 2023-05-01: qty 250

## 2023-05-01 MED ORDER — SODIUM CHLORIDE 0.9 % IV SOLN
200.0000 mg | Freq: Once | INTRAVENOUS | Status: AC
  Administered 2023-05-01: 200 mg via INTRAVENOUS
  Filled 2023-05-01: qty 8

## 2023-05-01 NOTE — Patient Instructions (Signed)

## 2023-05-01 NOTE — Progress Notes (Signed)
No concerns today 

## 2023-05-01 NOTE — Progress Notes (Signed)
[facility] CONSULT NOTE  Patient Care Team: Malva Limes, MD as PCP - General (Family Medicine) Lady Gary Darlin Priestly, MD as Consulting Physician (Cardiology) Schnier, Latina Craver, MD (Vascular Surgery) Lonell Face, MD as Consulting Physician (Neurology) Delano Metz, MD as Referring Physician (Pain Medicine) Pa, [facility] (Optometry) Lady Gary, Darlin Priestly, MD as Consulting Physician (Cardiology) Pasty Spillers, MD (Inactive) as Consulting Physician (Gastroenterology) Nadara Mustard, MD as Referring Physician (Obstetrics and Gynecology) Glory Buff, RN as Oncology Nurse Navigator Earna Coder, MD as Consulting Physician (Oncology)   CHIEF COMPLAINTS/PURPOSE OF CONSULTATION: lung cancer  #  Oncology History Overview Note  #MAY 2022- LLL nodule 2.3 cm Adenocarcinoma Stage IA; s/p lobectomy.  No adjuvant therapy  # CT scan 4th June 2023-highly concerning for recurrent/metastatic disease with-. New left adrenal metastasis;  Ground-glass and part solid nodules in the peripheral right lower lobe, unchanged from 11/07/2021 but slightly enlarged from 01/01/2018. Findings are suspicious for indolent adenocarcinoma.  F One- PFL1 =80%; KRAS G12C PET scan JUNE 20th- 2023-  Enlarged 4 cm hypermetabolic LEFT adrenal metastasis;  Suspicion of metastatic adenopathy to the LEFT hilum.  Part solid nodule in the RIGHT lower lobe without metabolic activity.   3 ADRENAL BIOPSY- CK7 POSITIVE ADENOCARCINOMA- There is limited tissue remaining for ancillary testing, not likely  sufficient for NGS testing.   # AUG 7th, 2023-single agent Keytruda.    Primary cancer of left lower lobe of lung (HCC)  05/09/2021 Initial Diagnosis   Primary cancer of left lower lobe of lung (HCC)   07/04/2022 -  Chemotherapy   Patient is on Treatment Plan : LUNG NSCLC Pembrolizumab (200) q21d     07/10/2022 - 07/10/2022 Chemotherapy   Patient is on Treatment Plan : LUNG NSCLC Pembrolizumab  (200) q21d        HISTORY OF PRESENTING ILLNESS: Patient  ambulating.  Alone.   Laura Nielsen 67 y.o.  female history of smoking; prior history of lung cancer-with likely recurrence to left adrenal gland [s/p Bx CK-7 positive TTF-1 negative]-clinically suggestive of recurrent lung cancer on single agent Rande Lawman is here for follow-up.   Awaiting cataract surgery.   Patient continues to have concerns of chronic constipation and headaches.  Denies any neck pain.   Denies any abdominal pain. Otherwise patient continues to have mild shortness of breath on exertion.  Denies any nausea vomiting. Complains of cramping in the legs chronic.   Review of Systems  Constitutional:  Negative for chills, diaphoresis, fever, malaise/fatigue and weight loss.  HENT:  Negative for nosebleeds and sore throat.   Eyes:  Negative for double vision.  Respiratory:  Negative for cough, hemoptysis, sputum production, shortness of breath and wheezing.   Cardiovascular:  Negative for palpitations, orthopnea and leg swelling.  Gastrointestinal:  Negative for abdominal pain, blood in stool, constipation, diarrhea, heartburn, melena, nausea and vomiting.  Genitourinary:  Negative for dysuria, frequency and urgency.  Musculoskeletal:  Positive for back pain and joint pain.  Skin: Negative.  Negative for itching and rash.  Neurological:  Positive for tingling. Negative for dizziness, focal weakness, weakness and headaches.  Endo/Heme/Allergies:  Does not bruise/bleed easily.  Psychiatric/Behavioral:  Negative for depression. The patient is not nervous/anxious and does not have insomnia.      MEDICAL HISTORY:  Past Medical History:  Diagnosis Date   AAA (abdominal aortic aneurysm) (HCC)    s/p EVAR AAA 03/27/13   Arthritis    Asthma    Cancer (HCC)  Complication of anesthesia    BP drops after   History of kidney stones    Osteoporosis    Sleep apnea     SURGICAL HISTORY: Past Surgical History:  Procedure  Laterality Date   ABDOMINAL AORTIC ENDOVASCULAR STENT GRAFT  03/27/2013   Dr. Levora Dredge   Cardiac catheterization  02/2009   70-80% stenosis RCA stent placed. started on Plavix   CARDIAC CATHETERIZATION     COLONOSCOPY WITH PROPOFOL N/A 01/05/2021   Procedure: COLONOSCOPY WITH PROPOFOL;  Surgeon: Pasty Spillers, MD;  Location: [facility] ENDOSCOPY;  Service: Endoscopy;  Laterality: N/A;   CORONARY ANGIOPLASTY     stent placed   ESOPHAGOGASTRODUODENOSCOPY (EGD) WITH PROPOFOL N/A 01/05/2021   Procedure: ESOPHAGOGASTRODUODENOSCOPY (EGD) WITH PROPOFOL;  Surgeon: Pasty Spillers, MD;  Location: [facility] ENDOSCOPY;  Service: Endoscopy;  Laterality: N/A;   INTERCOSTAL NERVE BLOCK Left 04/06/2021   Procedure: INTERCOSTAL NERVE BLOCK;  Surgeon: Loreli Slot, MD;  Location: [facility] OR;  Service: Thoracic;  Laterality: Left;   IR IMAGING GUIDED PORT INSERTION  06/20/2022   KIDNEY STONE SURGERY  1999   LUNG LOBECTOMY Left 04/06/2021   robotic left lower lobectomy 04/06/2021 Dr. Dorris Fetch for Stage 1A adenocarcinoma   NODE DISSECTION Left 04/06/2021   Procedure: NODE DISSECTION;  Surgeon: Loreli Slot, MD;  Location: [facility] OR;  Service: Thoracic;  Laterality: Left;   TUBAL LIGATION     VAGINAL HYSTERECTOMY     Menometrorrhagia. Excessive bleeding. Unknown if cervix removed.     SOCIAL HISTORY: Social History   Socioeconomic History   Marital status: Divorced    Spouse name: Not on file   Number of children: 2   Years of education: H/S   Highest education level: High school graduate  Occupational History   Occupation: Disabled   Occupation: retired  Tobacco Use   Smoking status: Former    Packs/day: 0.25    Years: 44.50    Additional pack years: 0.00    Total pack years: 11.13    Types: Cigarettes    Quit date: 04/05/2021    Years since quitting: 2.0   Smokeless tobacco: Never   Tobacco comments:    12/23/19 states she quit for 3 months and then started back. Pt is  going to talk with MD about this.  Vaping Use   Vaping Use: Never used  Substance and Sexual Activity   Alcohol use: Yes    Comment: rarely - once a year   Drug use: No   Sexual activity: Not on file  Other Topics Concern   Not on file  Social History Narrative   Hx of smoking; quit prior to lung surgery. Lives in [place]- self. No alcohol. Used to work in [employer], [employer]- Facilities manager. On disability sec to spinal pain.    Social Determinants of Health   Financial Resource Strain: Medium Risk (02/16/2023)   Overall Financial Resource Strain (CARDIA)    Difficulty of Paying Living Expenses: Somewhat hard  Food Insecurity: Patient Declined (02/16/2023)   Hunger Vital Sign    Worried About Running Out of Food in the Last Year: Patient declined    Ran Out of Food in the Last Year: Patient declined  Transportation Needs: No Transportation Needs (02/16/2023)   PRAPARE - Administrator, Civil Service (Medical): No    Lack of Transportation (Non-Medical): No  Physical Activity: Unknown (02/16/2023)   Exercise Vital Sign    Days of Exercise per Week: 0 days    Minutes  of Exercise per Session: Patient declined  Stress: Stress Concern Present (02/16/2023)   [employer] of Occupational Health - Occupational Stress Questionnaire    Feeling of Stress : Rather much  Social Connections: Unknown (02/16/2023)   Social Connection and Isolation Panel [NHANES]    Frequency of Communication with Friends and Family: More than three times a week    Frequency of Social Gatherings with Friends and Family: Once a week    Attends Religious Services: Not on Marketing executive or Organizations: No    Attends Banker Meetings: Never    Marital Status: Patient declined  Intimate Partner Violence: Not At Risk (02/20/2023)   Humiliation, Afraid, Rape, and Kick questionnaire    Fear of Current or Ex-Partner: No    Emotionally Abused: No    Physically Abused: No    Sexually  Abused: No    FAMILY HISTORY: Family History  Problem Relation Age of Onset   Hypertension Mother    Coronary artery disease Mother    Heart attack Mother        acute   Cancer Mother    Alcohol abuse Father    Depression Father    Hypertension Father    Heart attack Father 35       acute   Alcohol abuse Sister    Hyperlipidemia Sister    Hypertension Sister    Cancer Sister 49   Heart attack Sister        x's 2   Coronary artery disease Sister 44       x's 2   Breast cancer Sister 30    ALLERGIES:  is allergic to atorvastatin, omeprazole, and bupropion.  MEDICATIONS:  Current Outpatient Medications  Medication Sig Dispense Refill   allopurinol (ZYLOPRIM) 100 MG tablet Take 1 tablet (100 mg total) by mouth daily. 90 tablet 3   aspirin 81 MG tablet Take 81 mg by mouth daily.      bisoprolol (ZEBETA) 10 MG tablet Take 1 tablet (10 mg total) by mouth daily. 90 tablet 1   Cholecalciferol 25 MCG (1000 UT) capsule Take 1,000 Units by mouth daily.     clopidogrel (PLAVIX) 75 MG tablet Take 1 tablet (75 mg total) by mouth daily. 90 tablet 4   fluticasone (FLONASE) 50 MCG/ACT nasal spray Place into both nostrils daily.     HYDROcodone-acetaminophen (NORCO/VICODIN) 5-325 MG tablet Take 1-2 tablets by mouth every 6 (six) hours as needed for moderate pain. 20 tablet 0   ibuprofen (ADVIL) 200 MG tablet Take 400 mg by mouth 2 (two) times daily as needed (headaches).     lidocaine-prilocaine (EMLA) cream Apply on the port. 30 -45 min  prior to port access. 30 g 3   lisinopril-hydrochlorothiazide (ZESTORETIC) 20-12.5 MG tablet Take 1 tablet by mouth daily. 90 tablet 1   pantoprazole (PROTONIX) 20 MG tablet Take 1 tablet (20 mg total) by mouth daily. 90 tablet 1   PROAIR HFA 108 (90 Base) MCG/ACT inhaler Inhale 2 puffs into the lungs every 6 (six) hours as needed for wheezing or shortness of breath. 8.5 g 4   rosuvastatin (CRESTOR) 20 MG tablet Take 1 tablet (20 mg total) by mouth daily.  90 tablet 1   Semaglutide, 1 MG/DOSE, (OZEMPIC, 1 MG/DOSE,) 4 MG/3ML SOPN Inject 1 mg into the skin once a week. 9 mL 1   diazepam (VALIUM) 5 MG tablet Take 1 tablet (5 mg total) by mouth every 12 (twelve) hours  as needed. (Patient not taking: Reported on 02/28/2023) 30 tablet 0   No current facility-administered medications for this visit.   Facility-Administered Medications Ordered in Other Visits  Medication Dose Route Frequency Provider Last Rate Last Admin   heparin lock flush 100 UNIT/ML injection             Mild maculopapular rash on the Maller area left more than right.  PHYSICAL EXAMINATION: ECOG PERFORMANCE STATUS: 1 - Symptomatic but completely ambulatory  Vitals:   05/01/23 0827  BP: 116/71  Pulse: 77  Temp: (!) 97.3 F (36.3 C)  SpO2: 97%    Filed Weights   05/01/23 0827  Weight: 206 lb 9.6 oz (93.7 kg)     Physical Exam HENT:     Head: Normocephalic and atraumatic.     Mouth/Throat:     Pharynx: No oropharyngeal exudate.  Eyes:     Pupils: Pupils are equal, round, and reactive to light.  Cardiovascular:     Rate and Rhythm: Normal rate and regular rhythm.  Pulmonary:     Comments: Decreased breath sounds bilaterally.  No wheeze or crackles Abdominal:     General: Bowel sounds are normal. There is no distension.     Palpations: Abdomen is soft. There is no mass.     Tenderness: There is no abdominal tenderness. There is no guarding or rebound.  Musculoskeletal:        General: No tenderness. Normal range of motion.     Cervical back: Normal range of motion and neck supple.  Skin:    General: Skin is warm.  Neurological:     Mental Status: She is alert and oriented to person, place, and time.  Psychiatric:        Mood and Affect: Affect normal.      LABORATORY DATA:  I have reviewed the data as listed Lab Results  Component Value Date   WBC 10.1 05/01/2023   HGB 15.0 05/01/2023   HCT 44.2 05/01/2023   MCV 84.4 05/01/2023   PLT 214  05/01/2023   Recent Labs    03/19/23 0838 04/09/23 0823 05/01/23 0820  NA 139 138 137  K 3.6 3.2* 3.5  CL 107 104 102  CO2 24 24 25   GLUCOSE 98 105* 105*  BUN 20 20 19   CREATININE 0.66 0.72 0.65  CALCIUM 9.2 9.4 9.7  GFRNONAA >60 >60 >60  PROT 6.9 6.7 7.5  ALBUMIN 3.8 3.8 4.1  AST 22 20 22   ALT 13 12 15   ALKPHOS 86 88 87  BILITOT 0.6 0.6 0.6    RADIOGRAPHIC STUDIES: I have personally reviewed the radiological images as listed and agreed with the findings in the report. No results found.   ASSESSMENT & PLAN:   Primary cancer of left lower lobe of lung (HCC) #JUNE 2023-STAGE IV--recurrent/metastatic disease with New left adrenal metastasis;  PET scan JUNE 20th- 2023-  Enlarged 4 cm hypermetabolic LEFT adrenal metastasis;  Suspicion of metastatic adenopathy to the LEFT hilum.  Part solid nodule in the RIGHT lower lobe without metabolic activity. 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 Currently on single agent Keytruda [PD-L1 greater than 80% ].  Currently on single agent Keytruda.    CT FEB 11th, 2024 [ER]- CT CAP- No acute intrathoracic, abdominal, or pelvic pathology. No aortic aneurysm or dissection;  No evidence of metastatic disease in the chest, abdomen, or pelvis. Several  ground-glass/part solid nodular density at the right lung base similar prior CT. see discussion below regarding right ovarian mass.will order scan today.   # Proceed of Keytruda single agent. Labs today reviewed;  acceptable for treatment today. Tolerating well except for mild chills. TSH- March 2024- WNL. stable  # mild Hypokalemia: discussed dietary supplement. stable  # Chronic pain: headaches/cervical pain/back pain [Dr.Naveira; pain doctor]  On NSAIDs [ monitor for now. OCT 2023- MRI brain- NEGATIVE for any metastatic disease.   Stable.  FEB 2024- MRI lumbar spine- degenerative disease-  on ibuprofen prn- BID. stable  # Left rib pain-Post throacotomy pain/tingling and numbness:  Continue gabapentin -300 mg TID; and then at extra at night prn. stable  # CAD/ PVD- cramping- s/p evaluation [Dr.Schneir] dec 2023 stable  # COPD-stable encouraged continue to avoid smoking; again counseled to quit smoking; recommend evaluation with pulmonary. stable  #Right mildly complex cystic adnexal mass noted- s/p prior pelvic ultrasound for further work-up [s/p Gyn-Onc; Dr.Berchuck; felt high risk for surgery] 8.1 x 5.8 cm increased from 6.3 by 4.3 cm. Enlarging septated right ovarian cyst. No metabolic activity was demonstrated on PET-CT of 05/23/2022.  Pelvic ultrasound showed increasing size of the ovarian lesion.  Discussed with gyn-Onc. Awaiting evaluation with gyn-Onc- on 5/29.   # Left eye cataract - in June, 10th 2024-   # IV Access :s/p  port placement- stable  *AM appts- Monday-   # DISPOSITION:  # keytruda today; #Follow-up in 3 weeks-MD labs/port- cbc/cmp; Keytruda; CT CAP prior- Dr. B    All questions were answered. The patient knows to call the clinic with any problems, questions or concerns.    Earna Coder, MD 05/01/2023 9:13 AM

## 2023-05-01 NOTE — Assessment & Plan Note (Addendum)
#  JUNE 2023-STAGE IV--recurrent/metastatic disease with New left adrenal metastasis;  PET scan JUNE 20th- 2023-  Enlarged 4 cm hypermetabolic LEFT adrenal metastasis;  Suspicion of metastatic adenopathy to the LEFT hilum.  Part solid nodule in the RIGHT lower lobe without metabolic activity. 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 Currently on single agent Keytruda [PD-L1 greater than 80% ].  Currently on single agent Keytruda.    CT FEB 11th, 2024 [ER]- CT CAP- No acute intrathoracic, abdominal, or pelvic pathology. No aortic aneurysm or dissection;  No evidence of metastatic disease in the chest, abdomen, or pelvis. Several ground-glass/part solid nodular density at the right lung base similar prior CT. see discussion below regarding right ovarian mass.will order scan today.   # Proceed of Keytruda single agent. Labs today reviewed;  acceptable for treatment today. Tolerating well except for mild chills. TSH- March 2024- WNL. stable  # mild Hypokalemia: discussed dietary supplement. stable  # Chronic pain: headaches/cervical pain/back pain [Dr.Naveira; pain doctor]  On NSAIDs [ monitor for now. OCT 2023- MRI brain- NEGATIVE for any metastatic disease.   Stable.  FEB 2024- MRI lumbar spine- degenerative disease- on ibuprofen prn- BID. stable  # Left rib pain-Post throacotomy pain/tingling and numbness:  Continue gabapentin -300 mg TID; and then at extra at night prn. stable  # CAD/ PVD- cramping- s/p evaluation [Dr.Schneir] dec 2023 stable  # COPD-stable encouraged continue to avoid smoking; again counseled to quit smoking; recommend evaluation with pulmonary. stable  #Right mildly complex cystic adnexal mass noted- s/p prior pelvic ultrasound for further work-up [s/p Gyn-Onc; Dr.Berchuck; felt high risk for surgery] 8.1 x 5.8 cm increased from 6.3 by 4.3 cm. Enlarging septated right  ovarian cyst. No metabolic activity was demonstrated on PET-CT of 05/23/2022.  Pelvic ultrasound showed increasing size of the ovarian lesion.  Discussed with gyn-Onc. Awaiting evaluation with gyn-Onc- on 5/29.   # Left eye cataract - in June, 10th 2024-   # IV Access :s/p  port placement- stable  *AM appts- Monday-   # DISPOSITION:  # keytruda today; #Follow-up in 3 weeks-MD labs/port- cbc/cmp; Keytruda; CT CAP prior- Dr. Leonard Schwartz

## 2023-05-01 NOTE — Patient Instructions (Signed)
[facility]  Discharge Instructions: Thank you for choosing [facility] to provide your oncology and hematology care.  If you have a lab appointment with the Cancer Center, please go directly to the Cancer Center and check in at the registration area.  Wear comfortable clothing and clothing appropriate for easy access to any Portacath or PICC line.   We strive to give you quality time with your provider. You may need to reschedule your appointment if you arrive late (15 or more minutes).  Arriving late affects you and other patients whose appointments are after yours.  Also, if you miss three or more appointments without notifying the office, you may be dismissed from the clinic at the provider's discretion.      For prescription refill requests, have your pharmacy contact our office and allow 72 hours for refills to be completed.    Today you received the following chemotherapy and/or immunotherapy agents KEYTRUDA      To help prevent nausea and vomiting after your treatment, we encourage you to take your nausea medication as directed.  BELOW ARE SYMPTOMS THAT SHOULD BE REPORTED IMMEDIATELY: *FEVER GREATER THAN 100.4 F (38 C) OR HIGHER *CHILLS OR SWEATING *NAUSEA AND VOMITING THAT IS NOT CONTROLLED WITH YOUR NAUSEA MEDICATION *UNUSUAL SHORTNESS OF BREATH *UNUSUAL BRUISING OR BLEEDING *URINARY PROBLEMS (pain or burning when urinating, or frequent urination) *BOWEL PROBLEMS (unusual diarrhea, constipation, pain near the anus) TENDERNESS IN MOUTH AND THROAT WITH OR WITHOUT PRESENCE OF ULCERS (sore throat, sores in mouth, or a toothache) UNUSUAL RASH, SWELLING OR PAIN  UNUSUAL VAGINAL DISCHARGE OR ITCHING   Items with * indicate a potential emergency and should be followed up as soon as possible or go to the Emergency Department if any problems should occur.  Please show the CHEMOTHERAPY ALERT CARD or IMMUNOTHERAPY ALERT CARD at check-in to  the Emergency Department and triage nurse.  Should you have questions after your visit or need to cancel or reschedule your appointment, please contact [facility]  [phone] and follow the prompts.  Office hours are 8:00 a.m. to 4:30 p.m. Monday - Friday. Please note that voicemails left after 4:00 p.m. may not be returned until the following business day.  We are closed weekends and major holidays. You have access to a nurse at all times for urgent questions. Please call the main number to the clinic [phone] and follow the prompts.  For any non-urgent questions, you may also contact your provider using MyChart. We now offer e-Visits for anyone 18 and older to request care online for non-urgent symptoms. For details visit [email or web address].   Also download the MyChart app! Go to the app store, search "MyChart", open the app, select [place], and log in with your MyChart username and password. . Pembrolizumab Injection What is this medication? PEMBROLIZUMAB (PEM broe LIZ ue mab) treats some types of cancer. It works by helping your immune system slow or stop the spread of cancer cells. It is a monoclonal antibody. This medicine may be used for other purposes; ask your health care provider or pharmacist if you have questions. COMMON BRAND NAME(S): Keytruda What should I tell my care team before I take this medication? They need to know if you have any of these conditions: Allogeneic stem cell transplant (uses someone else's stem cells) Autoimmune diseases, such as Crohn disease, ulcerative colitis, lupus History of chest radiation Nervous system problems, such as Guillain-Barre syndrome, myasthenia gravis   Organ transplant An unusual or allergic reaction to pembrolizumab, other medications, foods, dyes, or preservatives Pregnant or trying to get pregnant Breast-feeding How should I use this medication? This medication is injected into a vein.  It is given by your care team in a hospital or clinic setting. A special MedGuide will be given to you before each treatment. Be sure to read this information carefully each time. Talk to your care team about the use of this medication in children. While it may be prescribed for children as young as 6 months for selected conditions, precautions do apply. Overdosage: If you think you have taken too much of this medicine contact a poison control center or emergency room at once. NOTE: This medicine is only for you. Do not share this medicine with others. What if I miss a dose? Keep appointments for follow-up doses. It is important not to miss your dose. Call your care team if you are unable to keep an appointment. What may interact with this medication? Interactions have not been studied. This list may not describe all possible interactions. Give your health care provider a list of all the medicines, herbs, non-prescription drugs, or dietary supplements you use. Also tell them if you smoke, drink alcohol, or use illegal drugs. Some items may interact with your medicine. What should I watch for while using this medication? Your condition will be monitored carefully while you are receiving this medication. You may need blood work while taking this medication. This medication may cause serious skin reactions. They can happen weeks to months after starting the medication. Contact your care team right away if you notice fevers or flu-like symptoms with a rash. The rash may be red or purple and then turn into blisters or peeling of the skin. You may also notice a red rash with swelling of the face, lips, or lymph nodes in your neck or under your arms. Tell your care team right away if you have any change in your eyesight. Talk to your care team if you may be pregnant. Serious birth defects can occur if you take this medication during pregnancy and for 4 months after the last dose. You will need a negative  pregnancy test before starting this medication. Contraception is recommended while taking this medication and for 4 months after the last dose. Your care team can help you find the option that works for you. Do not breastfeed while taking this medication and for 4 months after the last dose. What side effects may I notice from receiving this medication? Side effects that you should report to your care team as soon as possible: Allergic reactions--skin rash, itching, hives, swelling of the face, lips, tongue, or throat Dry cough, shortness of breath or trouble breathing Eye pain, redness, irritation, or discharge with blurry or decreased vision Heart muscle inflammation--unusual weakness or fatigue, shortness of breath, chest pain, fast or irregular heartbeat, dizziness, swelling of the ankles, feet, or hands Hormone gland problems--headache, sensitivity to light, unusual weakness or fatigue, dizziness, fast or irregular heartbeat, increased sensitivity to cold or heat, excessive sweating, constipation, hair loss, increased thirst or amount of urine, tremors or shaking, irritability Infusion reactions--chest pain, shortness of breath or trouble breathing, feeling faint or lightheaded Kidney injury (glomerulonephritis)--decrease in the amount of urine, red or dark brown urine, foamy or bubbly urine, swelling of the ankles, hands, or feet Liver injury--right upper belly pain, loss of appetite, nausea, light-colored stool, dark yellow or brown urine, yellowing skin or eyes, unusual weakness   or fatigue Pain, tingling, or numbness in the hands or feet, muscle weakness, change in vision, confusion or trouble speaking, loss of balance or coordination, trouble walking, seizures Rash, fever, and swollen lymph nodes Redness, blistering, peeling, or loosening of the skin, including inside the mouth Sudden or severe stomach pain, bloody diarrhea, fever, nausea, vomiting Side effects that usually do not require  medical attention (report to your care team if they continue or are bothersome): Bone, joint, or muscle pain Diarrhea Fatigue Loss of appetite Nausea Skin rash This list may not describe all possible side effects. Call your doctor for medical advice about side effects. You may report side effects to FDA at 1-800-FDA-1088. Where should I keep my medication? This medication is given in a hospital or clinic. It will not be stored at home. NOTE: This sheet is a summary. It may not cover all possible information. If you have questions about this medicine, talk to your doctor, pharmacist, or health care provider.  2023 Elsevier/Gold Standard (2022-04-04 00:00:00)    

## 2023-05-02 ENCOUNTER — Inpatient Hospital Stay: Payer: Medicare Other

## 2023-05-02 LAB — T4: T4, Total: 7.9 ug/dL (ref 4.5–12.0)

## 2023-05-07 ENCOUNTER — Encounter: Payer: Self-pay | Admitting: Ophthalmology

## 2023-05-07 DIAGNOSIS — H25042 Posterior subcapsular polar age-related cataract, left eye: Secondary | ICD-10-CM | POA: Diagnosis not present

## 2023-05-07 DIAGNOSIS — H2512 Age-related nuclear cataract, left eye: Secondary | ICD-10-CM | POA: Diagnosis not present

## 2023-05-10 NOTE — Discharge Instructions (Signed)

## 2023-05-11 NOTE — Anesthesia Preprocedure Evaluation (Signed)
Anesthesia Evaluation  Patient identified by MRN, date of birth, ID band Patient awake    Reviewed: Allergy & Precautions, H&P , NPO status , Patient's Chart, lab work & pertinent test results  History of Anesthesia Complications (+) history of anesthetic complications  Airway Mallampati: II  TM Distance: >3 FB Neck ROM: Full    Dental  (+) Poor Dentition, Loose Some missing, loose left upper tooth and loose left central incisor, needs dental work, doesn't get numb from local anesthesia, says she was told she is "too high risk to be put to sleep," but has not been told to get cardiac clearance/letter of risk, nor pulmonary letter of risk:   Pulmonary asthma , sleep apnea , COPD, former smoker   Pulmonary exam normal breath sounds clear to auscultation       Cardiovascular hypertension, + CAD and + Peripheral Vascular Disease  negative cardio ROS Normal cardiovascular exam Rhythm:Regular Rate:Normal  . Xience DES mid RCA 03/16/2009 2. Endovascular repair of abdominal aortic aneurysm 3. Essential hypertension 4. Hyperlipidemia 5. COPD / ongoing tobacco abuse 6. Recurrent stage IV left lower lobe lung cancer on Keytruda  Patient previously followed by Dr. Lady Gary. She is status post Xience DES mid RCA 03/16/2009. She also is status post endovascular repair of abdominal aortic aneurysm followed by Dr. Lorretta Harp.  She returns today, reports "doing okay". She denies chest pain. She has chronic exertional dyspnea due to underlying COPD and ongoing tobacco abuse. The patient continues smoke less than a pack of cigarettes per day. Oxygen saturation 96% on room air. She denies palpitations or heart racing. She denies peripheral edema. The patient is active but does not exercise regularly.   ECG/11/2021 revealed normal sinus rhythm at 76 bpm.   2D echocardiogram 10/22/2017 revealed LVEF greater than 55% with mild tricuspid regurgitation.      Neuro/Psych  Headaches PSYCHIATRIC DISORDERS  Depression     Neuromuscular disease negative neurological ROS  negative psych ROS   GI/Hepatic negative GI ROS, Neg liver ROS,GERD  ,,Severe GERD, partly controlled w/pantropazole, didn't take pantoprazole today   Endo/Other  negative endocrine ROSdiabetes  obesity  Renal/GU negative Renal ROS  negative genitourinary   Musculoskeletal negative musculoskeletal ROS (+) Arthritis ,    Abdominal   Peds negative pediatric ROS (+)  Hematology negative hematology ROS (+)   Anesthesia Other Findings AAA (abdominal aortic aneurysm) (HCC)  s/p EVAR AAA 03/27/13 Arthritis    Asthma    Cancer (HCC)    Complication of anesthesia BP drops Coronary artery disease  History of kidney stones    Osteoporosis    Sleep apnea       Reproductive/Obstetrics negative OB ROS                             Anesthesia Physical Anesthesia Plan  ASA: 3  Anesthesia Plan: MAC   Post-op Pain Management:    Induction: Intravenous  PONV Risk Score and Plan:   Airway Management Planned: Natural Airway and Nasal Cannula  Additional Equipment:   Intra-op Plan:   Post-operative Plan:   Informed Consent: I have reviewed the patients History and Physical, chart, labs and discussed the procedure including the risks, benefits and alternatives for the proposed anesthesia with the patient or authorized representative who has indicated his/her understanding and acceptance.     Dental Advisory Given  Plan Discussed with: Anesthesiologist, CRNA and Surgeon  Anesthesia Plan Comments: (Patient consented for risks of  anesthesia including but not limited to:  - adverse reactions to medications - damage to eyes, teeth, lips or other oral mucosa - nerve damage due to positioning  - sore throat or hoarseness - Damage to heart, brain, nerves, lungs, other parts of body or loss of life  Patient voiced understanding. Very concerned  that local anesthesia will be ineffective, but patient states it is "sometimes" effective, and wishes to try today. IF not effective, will need cardiac letter of risk prior to intubation and GETA. )        Anesthesia Quick Evaluation

## 2023-05-14 ENCOUNTER — Encounter
Admission: RE | Disposition: A | Discharge status: Home / Self Care | Payer: Self-pay | Source: Ambulatory Visit | Attending: Ophthalmology

## 2023-05-14 ENCOUNTER — Ambulatory Visit: Payer: Medicare Other | Admitting: Anesthesiology

## 2023-05-14 ENCOUNTER — Ambulatory Visit
Admission: RE | Admit: 2023-05-14 | Discharge: 2023-05-14 | Disposition: A | Discharge status: Home / Self Care | Payer: Medicare Other | Source: Ambulatory Visit | Attending: Ophthalmology | Admitting: Ophthalmology

## 2023-05-14 ENCOUNTER — Other Ambulatory Visit: Payer: Self-pay

## 2023-05-14 ENCOUNTER — Ambulatory Visit: Payer: Medicare Other

## 2023-05-14 ENCOUNTER — Encounter: Payer: Self-pay | Admitting: Ophthalmology

## 2023-05-14 DIAGNOSIS — I2510 Atherosclerotic heart disease of native coronary artery without angina pectoris: Secondary | ICD-10-CM | POA: Diagnosis not present

## 2023-05-14 DIAGNOSIS — Z09 Encounter for follow-up examination after completed treatment for conditions other than malignant neoplasm: Secondary | ICD-10-CM | POA: Insufficient documentation

## 2023-05-14 DIAGNOSIS — Z79899 Other long term (current) drug therapy: Secondary | ICD-10-CM | POA: Insufficient documentation

## 2023-05-14 DIAGNOSIS — I10 Essential (primary) hypertension: Secondary | ICD-10-CM | POA: Insufficient documentation

## 2023-05-14 DIAGNOSIS — H2512 Age-related nuclear cataract, left eye: Secondary | ICD-10-CM | POA: Diagnosis not present

## 2023-05-14 DIAGNOSIS — E785 Hyperlipidemia, unspecified: Secondary | ICD-10-CM | POA: Insufficient documentation

## 2023-05-14 DIAGNOSIS — J449 Chronic obstructive pulmonary disease, unspecified: Secondary | ICD-10-CM | POA: Diagnosis not present

## 2023-05-14 DIAGNOSIS — Z5986 Financial insecurity: Secondary | ICD-10-CM | POA: Insufficient documentation

## 2023-05-14 DIAGNOSIS — G4730 Sleep apnea, unspecified: Secondary | ICD-10-CM | POA: Insufficient documentation

## 2023-05-14 DIAGNOSIS — Z87891 Personal history of nicotine dependence: Secondary | ICD-10-CM | POA: Insufficient documentation

## 2023-05-14 DIAGNOSIS — E1136 Type 2 diabetes mellitus with diabetic cataract: Secondary | ICD-10-CM | POA: Diagnosis not present

## 2023-05-14 DIAGNOSIS — K219 Gastro-esophageal reflux disease without esophagitis: Secondary | ICD-10-CM | POA: Insufficient documentation

## 2023-05-14 HISTORY — PX: CATARACT EXTRACTION W/PHACO: SHX586

## 2023-05-14 SURGERY — PHACOEMULSIFICATION, CATARACT, WITH IOL INSERTION
Anesthesia: Monitor Anesthesia Care | Site: Eye | Laterality: Left

## 2023-05-14 MED ORDER — TETRACAINE HCL 0.5 % OP SOLN
1.0000 [drp] | OPHTHALMIC | Status: DC | PRN
  Administered 2023-05-14 (×2): 1 [drp] via OPHTHALMIC

## 2023-05-14 MED ORDER — ARMC OPHTHALMIC DILATING DROPS
1.0000 | OPHTHALMIC | Status: DC | PRN
  Administered 2023-05-14 (×3): 1 via OPHTHALMIC

## 2023-05-14 MED ORDER — MOXIFLOXACIN HCL 0.5 % OP SOLN
OPHTHALMIC | Status: DC | PRN
  Administered 2023-05-14: .2 mL via OPHTHALMIC

## 2023-05-14 MED ORDER — LACTATED RINGERS IV SOLN: INTRAVENOUS | Status: DC

## 2023-05-14 MED ORDER — SODIUM CHLORIDE 0.9% FLUSH
INTRAVENOUS | Status: DC | PRN
  Administered 2023-05-14: 10 mL via INTRAVENOUS

## 2023-05-14 MED ORDER — FENTANYL CITRATE (PF) 100 MCG/2ML IJ SOLN
INTRAMUSCULAR | Status: DC | PRN
  Administered 2023-05-14 (×2): 50 ug via INTRAVENOUS

## 2023-05-14 MED ORDER — BRIMONIDINE TARTRATE-TIMOLOL 0.2-0.5 % OP SOLN
OPHTHALMIC | Status: DC | PRN
  Administered 2023-05-14: 1 [drp] via OPHTHALMIC

## 2023-05-14 MED ORDER — PHENYLEPHRINE-KETOROLAC 1-0.3 % IO SOLN
INTRAOCULAR | Status: DC | PRN
  Administered 2023-05-14: 84 mL via OPHTHALMIC

## 2023-05-14 MED ORDER — LIDOCAINE HCL (PF) 2 % IJ SOLN
INTRAOCULAR | Status: DC | PRN
  Administered 2023-05-14: 1 mL via INTRAOCULAR

## 2023-05-14 MED ORDER — SIGHTPATH DOSE#1 BSS IO SOLN
INTRAOCULAR | Status: DC | PRN
  Administered 2023-05-14: 15 mL

## 2023-05-14 MED ORDER — SIGHTPATH DOSE#1 NA HYALUR & NA CHOND-NA HYALUR IO KIT
PACK | INTRAOCULAR | Status: DC | PRN
  Administered 2023-05-14: 1 via OPHTHALMIC

## 2023-05-14 MED ORDER — MIDAZOLAM HCL 2 MG/2ML IJ SOLN
INTRAMUSCULAR | Status: DC | PRN
  Administered 2023-05-14: 2 mg via INTRAVENOUS

## 2023-05-14 SURGICAL SUPPLY — 9 items
CATARACT SUITE SIGHTPATH (MISCELLANEOUS) ×1 IMPLANT
DISSECTOR HYDRO NUCLEUS 50X22 (MISCELLANEOUS) ×1 IMPLANT
DRSG TEGADERM 2-3/8X2-3/4 SM (GAUZE/BANDAGES/DRESSINGS) ×1 IMPLANT
FEE CATARACT SUITE SIGHTPATH (MISCELLANEOUS) ×1 IMPLANT
GLOVE SURG SYN 7.5 E (GLOVE) ×1 IMPLANT
GLOVE SURG SYN 7.5 PF PI (GLOVE) ×1 IMPLANT
GLOVE SURG SYN 8.5 E (GLOVE) ×1 IMPLANT
GLOVE SURG SYN 8.5 PF PI (GLOVE) ×1 IMPLANT
LENS IOL TECNIS EYHANCE 19.0 (Intraocular Lens) IMPLANT

## 2023-05-14 NOTE — H&P (Signed)
[facility]   Primary Care Physician:  Malva Limes, MD Ophthalmologist: Dr. Deberah Pelton  Pre-Procedure History & Physical: HPI:  Laura Nielsen is a 67 y.o. female here for cataract surgery.   Past Medical History:  Diagnosis Date   AAA (abdominal aortic aneurysm) (HCC)    s/p EVAR AAA 03/27/13   Arthritis    Asthma    Cancer (HCC)    Complication of anesthesia    BP drops after   Coronary artery disease    History of kidney stones    Osteoporosis    Sleep apnea     Past Surgical History:  Procedure Laterality Date   ABDOMINAL AORTIC ENDOVASCULAR STENT GRAFT  03/27/2013   Dr. Levora Dredge   Cardiac catheterization  02/2009   70-80% stenosis RCA stent placed. started on Plavix   CARDIAC CATHETERIZATION     COLONOSCOPY WITH PROPOFOL N/A 01/05/2021   Procedure: COLONOSCOPY WITH PROPOFOL;  Surgeon: Pasty Spillers, MD;  Location: [facility] ENDOSCOPY;  Service: Endoscopy;  Laterality: N/A;   CORONARY ANGIOPLASTY     stent placed   ESOPHAGOGASTRODUODENOSCOPY (EGD) WITH PROPOFOL N/A 01/05/2021   Procedure: ESOPHAGOGASTRODUODENOSCOPY (EGD) WITH PROPOFOL;  Surgeon: Pasty Spillers, MD;  Location: [facility] ENDOSCOPY;  Service: Endoscopy;  Laterality: N/A;   INTERCOSTAL NERVE BLOCK Left 04/06/2021   Procedure: INTERCOSTAL NERVE BLOCK;  Surgeon: Loreli Slot, MD;  Location: [facility] OR;  Service: Thoracic;  Laterality: Left;   IR IMAGING GUIDED PORT INSERTION  06/20/2022   KIDNEY STONE SURGERY  1999   LUNG LOBECTOMY Left 04/06/2021   robotic left lower lobectomy 04/06/2021 Dr. Dorris Fetch for Stage 1A adenocarcinoma   NODE DISSECTION Left 04/06/2021   Procedure: NODE DISSECTION;  Surgeon: Loreli Slot, MD;  Location: [facility] OR;  Service: Thoracic;  Laterality: Left;   TUBAL LIGATION     VAGINAL HYSTERECTOMY     Menometrorrhagia. Excessive bleeding. Unknown if cervix removed.     Prior to Admission medications   Medication Sig Start Date End Date Taking?  Authorizing Provider  allopurinol (ZYLOPRIM) 100 MG tablet Take 1 tablet (100 mg total) by mouth daily. 08/28/22  Yes Malva Limes, MD  aspirin 81 MG tablet Take 81 mg by mouth daily.  03/25/09  Yes [provider]  bisoprolol (ZEBETA) 10 MG tablet Take 1 tablet (10 mg total) by mouth daily. 04/15/23  Yes Malva Limes, MD  Cholecalciferol 25 MCG (1000 UT) capsule Take 1,000 Units by mouth daily.   Yes [provider]  clopidogrel (PLAVIX) 75 MG tablet Take 1 tablet (75 mg total) by mouth daily. 08/04/22  Yes Malva Limes, MD  fluticasone (FLONASE) 50 MCG/ACT nasal spray Place into both nostrils daily.   Yes [provider]  HYDROcodone-acetaminophen (NORCO/VICODIN) 5-325 MG tablet Take 1-2 tablets by mouth every 6 (six) hours as needed for moderate pain. 01/14/23  Yes Sharyn Creamer, MD  ibuprofen (ADVIL) 200 MG tablet Take 400 mg by mouth 2 (two) times daily as needed (headaches).   Yes [provider]  lidocaine-prilocaine (EMLA) cream Apply on the port. 30 -45 min  prior to port access. 07/04/22  Yes Earna Coder, MD  lisinopril-hydrochlorothiazide (ZESTORETIC) 20-12.5 MG tablet Take 1 tablet by mouth daily. 03/01/23  Yes Malva Limes, MD  pantoprazole (PROTONIX) 20 MG tablet Take 1 tablet (20 mg total) by mouth daily. 03/19/23  Yes Malva Limes, MD  PROAIR HFA 108 [phone] Base) MCG/ACT inhaler Inhale 2 puffs into the lungs  every 6 (six) hours as needed for wheezing or shortness of breath. 12/02/21  Yes Malva Limes, MD  rosuvastatin (CRESTOR) 20 MG tablet Take 1 tablet (20 mg total) by mouth daily. 03/01/23  Yes Malva Limes, MD  Semaglutide, 1 MG/DOSE, (OZEMPIC, 1 MG/DOSE,) 4 MG/3ML SOPN Inject 1 mg into the skin once a week. 03/19/23  Yes Malva Limes, MD  diazepam (VALIUM) 5 MG tablet Take 1 tablet (5 mg total) by mouth every 12 (twelve) hours as needed. Patient not taking: Reported on 02/28/2023 01/15/23   Earna Coder, MD     Allergies as of 04/19/2023 - Review Complete 04/09/2023  Allergen Reaction Noted   Atorvastatin Other (See Comments) 09/25/2016   Omeprazole Nausea And Vomiting 07/28/2015   Bupropion Nausea Only 02/12/2019    Family History  Problem Relation Age of Onset   Hypertension Mother    Coronary artery disease Mother    Heart attack Mother        acute   Cancer Mother    Alcohol abuse Father    Depression Father    Hypertension Father    Heart attack Father 69       acute   Alcohol abuse Sister    Hyperlipidemia Sister    Hypertension Sister    Cancer Sister 29   Heart attack Sister        x's 2   Coronary artery disease Sister 20       x's 2   Breast cancer Sister 34    Social History   Socioeconomic History   Marital status: Divorced    Spouse name: Not on file   Number of children: 2   Years of education: H/S   Highest education level: High school graduate  Occupational History   Occupation: Disabled   Occupation: retired  Tobacco Use   Smoking status: Former    Packs/day: 0.25    Years: 44.50    Additional pack years: 0.00    Total pack years: 11.13    Types: Cigarettes    Quit date: 04/05/2021    Years since quitting: 2.1   Smokeless tobacco: Never   Tobacco comments:    12/23/19 states she quit for 3 months and then started back. Pt is going to talk with MD about this.  Vaping Use   Vaping Use: Never used  Substance and Sexual Activity   Alcohol use: Yes    Comment: rarely - once a year   Drug use: No   Sexual activity: Not on file  Other Topics Concern   Not on file  Social History Narrative   Hx of smoking; quit prior to lung surgery. Lives in [place]- self. No alcohol. Used to work in [employer], [employer]- Facilities manager. On disability sec to spinal pain.    Social Determinants of Health   Financial Resource Strain: Medium Risk (02/16/2023)   Overall Financial Resource Strain (CARDIA)    Difficulty of Paying Living Expenses: Somewhat hard  Food  Insecurity: Patient Declined (02/16/2023)   Hunger Vital Sign    Worried About Running Out of Food in the Last Year: Patient declined    Ran Out of Food in the Last Year: Patient declined  Transportation Needs: No Transportation Needs (02/16/2023)   PRAPARE - Administrator, Civil Service (Medical): No    Lack of Transportation (Non-Medical): No  Physical Activity: Unknown (02/16/2023)   Exercise Vital Sign    Days of Exercise per Week: 0 days  Minutes of Exercise per Session: Patient declined  Stress: Stress Concern Present (02/16/2023)   [employer] of Occupational Health - Occupational Stress Questionnaire    Feeling of Stress : Rather much  Social Connections: Unknown (02/16/2023)   Social Connection and Isolation Panel [NHANES]    Frequency of Communication with Friends and Family: More than three times a week    Frequency of Social Gatherings with Friends and Family: Once a week    Attends Religious Services: Not on Marketing executive or Organizations: No    Attends Banker Meetings: Never    Marital Status: Patient declined  Intimate Partner Violence: Not At Risk (02/20/2023)   Humiliation, Afraid, Rape, and Kick questionnaire    Fear of Current or Ex-Partner: No    Emotionally Abused: No    Physically Abused: No    Sexually Abused: No    Review of Systems: See HPI, otherwise negative ROS  Physical Exam: There were no vitals taken for this visit. General:   Alert, cooperative in NAD Head:  Normocephalic and atraumatic. Respiratory:  Normal work of breathing. Cardiovascular:  RRR  Impression/Plan: Shia Lhotka is here for cataract surgery.  Risks, benefits, limitations, and alternatives regarding cataract surgery have been reviewed with the patient.  Questions have been answered.  All parties agreeable.   Estanislado Pandy, MD  05/14/2023, 7:16 AM

## 2023-05-14 NOTE — Anesthesia Postprocedure Evaluation (Signed)
Anesthesia Post Note  Patient: Laura Nielsen  Procedure(s) Performed: CATARACT EXTRACTION PHACO AND INTRAOCULAR LENS PLACEMENT (IOC) LEFT DIABETIC  OMIDRIA  19.40  01:34.8 (Left: Eye)  Patient location during evaluation: PACU Anesthesia Type: MAC Level of consciousness: awake and alert Pain management: pain level controlled Vital Signs Assessment: post-procedure vital signs reviewed and stable Respiratory status: spontaneous breathing, nonlabored ventilation, respiratory function stable and patient connected to nasal cannula oxygen Cardiovascular status: stable and blood pressure returned to baseline Postop Assessment: no apparent nausea or vomiting Anesthetic complications: no   No notable events documented.   Last Vitals:  Vitals:   05/14/23 1400 05/14/23 1402  BP: 106/67 105/78  Pulse: 71 69  Resp: 15 17  Temp:    SpO2: 96% 99%    Last Pain:  Vitals:   05/14/23 1402  TempSrc:   PainSc: 0-No pain                 [name] C Khyree Carillo

## 2023-05-14 NOTE — Transfer of Care (Signed)
Immediate Anesthesia Transfer of Care Note  Patient: Laura Nielsen  Procedure(s) Performed: CATARACT EXTRACTION PHACO AND INTRAOCULAR LENS PLACEMENT (IOC) LEFT DIABETIC  OMIDRIA  19.40  01:34.8 (Left: Eye)  Patient Location: PACU  Anesthesia Type:MAC  Level of Consciousness: awake, alert , and oriented  Airway & Oxygen Therapy: Patient Spontanous Breathing  Post-op Assessment: Report given to RN and Post -op Vital signs reviewed and stable  Post vital signs: Reviewed and stable  Last Vitals:  Vitals Value Taken Time  BP 102/83 05/14/23 1358  Temp 36.8 C 05/14/23 1358  Pulse 69 05/14/23 1400  Resp 11 05/14/23 1400  SpO2 99 % 05/14/23 1400  Vitals shown include unvalidated device data.  Last Pain:  Vitals:   05/14/23 1359  TempSrc:   PainSc: 0-No pain         Complications: No notable events documented.

## 2023-05-14 NOTE — Op Note (Signed)
OPERATIVE NOTE  Castella Lerner [id number] 05/14/2023   PREOPERATIVE DIAGNOSIS: Nuclear sclerotic cataract left eye. H25.12   POSTOPERATIVE DIAGNOSIS: Nuclear sclerotic cataract left eye. H25.12   PROCEDURE:  Phacoemusification with posterior chamber intraocular lens placement of the left eye  Ultrasound time: Procedure(s): CATARACT EXTRACTION PHACO AND INTRAOCULAR LENS PLACEMENT (IOC) LEFT DIABETIC  OMIDRIA  19.40  01:34.8 (Left)  LENS:   Implant Name Type Inv. Item Serial No. Manufacturer Lot No. LRB No. Used Action  LENS IOL TECNIS EYHANCE 19.0 - [id number] Intraocular Lens LENS IOL TECNIS EYHANCE 19.0 [id number] [employer]  Left 1 Implanted      SURGEON:  Julious Payer. Rolley Sims, MD   ANESTHESIA:  Topical with tetracaine drops, augmented with 1% preservative-free intracameral lidocaine.   COMPLICATIONS:  None.   DESCRIPTION OF PROCEDURE:  The patient was identified in the holding room and transported to the operating room and placed in the supine position under the operating microscope.  The left eye was identified as the operative eye, which was prepped and draped in the usual sterile ophthalmic fashion.   A 1 millimeter clear-corneal paracentesis was made inferotemporally. Preservative-free 1% lidocaine mixed with 1:1,000 bisulfite-free aqueous solution of epinephrine was injected into the anterior chamber. The anterior chamber was then filled with Viscoat viscoelastic. A 2.4 millimeter keratome was used to make a clear-corneal incision superotemporally. A curvilinear capsulorrhexis was made with a cystotome and capsulorrhexis forceps. Balanced salt solution was used to hydrodissect and hydrodelineate the nucleus. Phacoemulsification was then used to remove the lens nucleus and epinucleus. The remaining cortex was then removed using the irrigation and aspiration handpiece. Provisc was then placed into the capsular bag to distend it for lens placement. A +19.00 D DIB00 intraocular lens was then  injected into the capsular bag. The remaining viscoelastic was aspirated.   Wounds were hydrated with balanced salt solution.  The anterior chamber was inflated to a physiologic pressure with balanced salt solution.  No wound leaks were noted. Vigamox was injected intracamerally.  Timolol and Brimonidine drops were applied to the eye.  The patient was taken to the recovery room in stable condition without complications of anesthesia or surgery.  Rolly Pancake [place] 05/14/2023, 1:56 PM

## 2023-05-15 ENCOUNTER — Encounter: Payer: Self-pay | Admitting: Ophthalmology

## 2023-05-16 ENCOUNTER — Ambulatory Visit
Admission: RE | Admit: 2023-05-16 | Discharge: 2023-05-16 | Disposition: A | Discharge status: Home / Self Care | Payer: Medicare Other | Source: Ambulatory Visit | Attending: Internal Medicine | Admitting: Internal Medicine

## 2023-05-16 DIAGNOSIS — C3432 Malignant neoplasm of lower lobe, left bronchus or lung: Secondary | ICD-10-CM | POA: Diagnosis not present

## 2023-05-16 DIAGNOSIS — C3490 Malignant neoplasm of unspecified part of unspecified bronchus or lung: Secondary | ICD-10-CM | POA: Diagnosis not present

## 2023-05-16 MED ORDER — IOHEXOL 300 MG/ML  SOLN
100.0000 mL | Freq: Once | INTRAMUSCULAR | Status: AC | PRN
  Administered 2023-05-16: 100 mL via INTRAVENOUS

## 2023-05-21 ENCOUNTER — Inpatient Hospital Stay: Payer: Medicare Other | Attending: Internal Medicine | Admitting: Internal Medicine

## 2023-05-21 ENCOUNTER — Inpatient Hospital Stay: Payer: Medicare Other

## 2023-05-21 ENCOUNTER — Encounter: Payer: Self-pay | Admitting: Internal Medicine

## 2023-05-21 VITALS — BP 119/73 | HR 77 | Temp 97.6°F | Ht 68.0 in | Wt 206.2 lb

## 2023-05-21 DIAGNOSIS — Z5112 Encounter for antineoplastic immunotherapy: Secondary | ICD-10-CM | POA: Diagnosis present

## 2023-05-21 DIAGNOSIS — C3432 Malignant neoplasm of lower lobe, left bronchus or lung: Secondary | ICD-10-CM

## 2023-05-21 DIAGNOSIS — Z87891 Personal history of nicotine dependence: Secondary | ICD-10-CM | POA: Diagnosis not present

## 2023-05-21 DIAGNOSIS — C7972 Secondary malignant neoplasm of left adrenal gland: Secondary | ICD-10-CM | POA: Insufficient documentation

## 2023-05-21 DIAGNOSIS — Z79899 Other long term (current) drug therapy: Secondary | ICD-10-CM | POA: Insufficient documentation

## 2023-05-21 LAB — CBC WITH DIFFERENTIAL/PLATELET
Abs Immature Granulocytes: 0.02 10*3/uL (ref 0.00–0.07)
Basophils Absolute: 0.1 10*3/uL (ref 0.0–0.1)
Basophils Relative: 1 %
Eosinophils Absolute: 0.6 10*3/uL — ABNORMAL HIGH (ref 0.0–0.5)
Eosinophils Relative: 7 %
HCT: 45.1 % (ref 36.0–46.0)
Hemoglobin: 14.9 g/dL (ref 12.0–15.0)
Immature Granulocytes: 0 %
Lymphocytes Relative: 39 %
Lymphs Abs: 3.4 10*3/uL (ref 0.7–4.0)
MCH: 28.2 pg (ref 26.0–34.0)
MCHC: 33 g/dL (ref 30.0–36.0)
MCV: 85.4 fL (ref 80.0–100.0)
Monocytes Absolute: 0.6 10*3/uL (ref 0.1–1.0)
Monocytes Relative: 7 %
Neutro Abs: 4 10*3/uL (ref 1.7–7.7)
Neutrophils Relative %: 46 %
Platelets: 224 10*3/uL (ref 150–400)
RBC: 5.28 MIL/uL — ABNORMAL HIGH (ref 3.87–5.11)
RDW: 14 % (ref 11.5–15.5)
WBC: 8.7 10*3/uL (ref 4.0–10.5)
nRBC: 0 % (ref 0.0–0.2)

## 2023-05-21 LAB — COMPREHENSIVE METABOLIC PANEL
ALT: 15 U/L (ref 0–44)
AST: 20 U/L (ref 15–41)
Albumin: 4.1 g/dL (ref 3.5–5.0)
Alkaline Phosphatase: 92 U/L (ref 38–126)
Anion gap: 8 (ref 5–15)
BUN: 20 mg/dL (ref 8–23)
CO2: 25 mmol/L (ref 22–32)
Calcium: 9.4 mg/dL (ref 8.9–10.3)
Chloride: 106 mmol/L (ref 98–111)
Creatinine, Ser: 0.69 mg/dL (ref 0.44–1.00)
GFR, Estimated: >60 mL/min (ref >60)
Glucose, Bld: 100 mg/dL — ABNORMAL HIGH (ref 70–99)
Potassium: 3.7 mmol/L (ref 3.5–5.1)
Sodium: 139 mmol/L (ref 135–145)
Total Bilirubin: 0.9 mg/dL (ref 0.3–1.2)
Total Protein: 7 g/dL (ref 6.5–8.1)

## 2023-05-21 MED ORDER — SODIUM CHLORIDE 0.9 % IV SOLN
200.0000 mg | Freq: Once | INTRAVENOUS | Status: AC
  Administered 2023-05-21: 200 mg via INTRAVENOUS
  Filled 2023-05-21: qty 200

## 2023-05-21 MED ORDER — SODIUM CHLORIDE 0.9 % IV SOLN
Freq: Once | INTRAVENOUS | Status: AC
  Filled 2023-05-21: qty 250

## 2023-05-21 MED ORDER — HEPARIN SOD (PORK) LOCK FLUSH 100 UNIT/ML IV SOLN
500.0000 [IU] | Freq: Once | INTRAVENOUS | Status: AC | PRN
  Administered 2023-05-21: 500 [IU]
  Filled 2023-05-21: qty 5

## 2023-05-21 NOTE — Progress Notes (Signed)
CT results. C/o with the metachronous primary ovarian malignancy listed in the report.

## 2023-05-21 NOTE — Progress Notes (Signed)
[facility] CONSULT NOTE  Patient Care Team: Malva Limes, MD as PCP - General (Family Medicine) Lady Gary Darlin Priestly, MD as Consulting Physician (Cardiology) Schnier, Latina Craver, MD (Vascular Surgery) Lonell Face, MD as Consulting Physician (Neurology) Delano Metz, MD as Referring Physician (Pain Medicine) Pa, [facility] (Optometry) Lady Gary, Darlin Priestly, MD as Consulting Physician (Cardiology) Pasty Spillers, MD (Inactive) as Consulting Physician (Gastroenterology) Nadara Mustard, MD as Referring Physician (Obstetrics and Gynecology) Glory Buff, RN as Oncology Nurse Navigator Earna Coder, MD as Consulting Physician (Oncology)   CHIEF COMPLAINTS/PURPOSE OF CONSULTATION: lung cancer  #  Oncology History Overview Note  #MAY 2022- LLL nodule 2.3 cm Adenocarcinoma Stage IA; s/p lobectomy.  No adjuvant therapy  # CT scan 4th June 2023-highly concerning for recurrent/metastatic disease with-. New left adrenal metastasis;  Ground-glass and part solid nodules in the peripheral right lower lobe, unchanged from 11/07/2021 but slightly enlarged from 01/01/2018. Findings are suspicious for indolent adenocarcinoma.  F One- PFL1 =80%; KRAS G12C PET scan JUNE 20th- 2023-  Enlarged 4 cm hypermetabolic LEFT adrenal metastasis;  Suspicion of metastatic adenopathy to the LEFT hilum.  Part solid nodule in the RIGHT lower lobe without metabolic activity.   3 ADRENAL BIOPSY- CK7 POSITIVE ADENOCARCINOMA- There is limited tissue remaining for ancillary testing, not likely  sufficient for NGS testing.   # AUG 7th, 2023-single agent Keytruda.    Primary cancer of left lower lobe of lung (HCC)  05/09/2021 Initial Diagnosis   Primary cancer of left lower lobe of lung (HCC)   07/04/2022 -  Chemotherapy   Patient is on Treatment Plan : LUNG NSCLC Pembrolizumab (200) q21d     07/10/2022 - 07/10/2022 Chemotherapy   Patient is on Treatment Plan : LUNG NSCLC Pembrolizumab  (200) q21d      HISTORY OF PRESENTING ILLNESS: Patient  ambulating.  Alone.   Laura Nielsen 67 y.o.  female history of smoking; prior history of lung cancer-with likely recurrence to left adrenal gland [s/p Bx CK-7 positive TTF-1 negative]-clinically suggestive of recurrent lung cancer on single agent Rande Lawman is here for follow-up/ CT scan.   S/p  cataract surgery on the left side.  Currently awaiting surgery on the right side.  Patient is concerned about the enlarging right-sided ovarian mass on the recent CT scan.  Denies any abdominal pain. Otherwise patient continues to have mild shortness of breath on exertion.  Denies any nausea vomiting. Complains of cramping in the legs chronic.   Review of Systems  Constitutional:  Negative for chills, diaphoresis, fever, malaise/fatigue and weight loss.  HENT:  Negative for nosebleeds and sore throat.   Eyes:  Negative for double vision.  Respiratory:  Negative for cough, hemoptysis, sputum production, shortness of breath and wheezing.   Cardiovascular:  Negative for palpitations, orthopnea and leg swelling.  Gastrointestinal:  Negative for abdominal pain, blood in stool, constipation, diarrhea, heartburn, melena, nausea and vomiting.  Genitourinary:  Negative for dysuria, frequency and urgency.  Musculoskeletal:  Positive for back pain and joint pain.  Skin: Negative.  Negative for itching and rash.  Neurological:  Positive for tingling. Negative for dizziness, focal weakness, weakness and headaches.  Endo/Heme/Allergies:  Does not bruise/bleed easily.  Psychiatric/Behavioral:  Negative for depression. The patient is not nervous/anxious and does not have insomnia.      MEDICAL HISTORY:  Past Medical History:  Diagnosis Date   AAA (abdominal aortic aneurysm) (HCC)    s/p EVAR AAA 03/27/13   Arthritis  Asthma    Cancer (HCC)    Complication of anesthesia    BP drops after   Coronary artery disease    History of kidney stones     Osteoporosis    Sleep apnea     SURGICAL HISTORY: Past Surgical History:  Procedure Laterality Date   ABDOMINAL AORTIC ENDOVASCULAR STENT GRAFT  03/27/2013   Dr. Levora Dredge   Cardiac catheterization  02/2009   70-80% stenosis RCA stent placed. started on Plavix   CARDIAC CATHETERIZATION     CATARACT EXTRACTION W/PHACO Left 05/14/2023   Procedure: CATARACT EXTRACTION PHACO AND INTRAOCULAR LENS PLACEMENT (IOC) LEFT DIABETIC  OMIDRIA  19.40  01:34.8;  Surgeon: Estanislado Pandy, MD;  Location: [facility];  Service: Ophthalmology;  Laterality: Left;   COLONOSCOPY WITH PROPOFOL N/A 01/05/2021   Procedure: COLONOSCOPY WITH PROPOFOL;  Surgeon: Pasty Spillers, MD;  Location: [facility] ENDOSCOPY;  Service: Endoscopy;  Laterality: N/A;   CORONARY ANGIOPLASTY     stent placed   ESOPHAGOGASTRODUODENOSCOPY (EGD) WITH PROPOFOL N/A 01/05/2021   Procedure: ESOPHAGOGASTRODUODENOSCOPY (EGD) WITH PROPOFOL;  Surgeon: Pasty Spillers, MD;  Location: [facility] ENDOSCOPY;  Service: Endoscopy;  Laterality: N/A;   INTERCOSTAL NERVE BLOCK Left 04/06/2021   Procedure: INTERCOSTAL NERVE BLOCK;  Surgeon: Loreli Slot, MD;  Location: [facility] OR;  Service: Thoracic;  Laterality: Left;   IR IMAGING GUIDED PORT INSERTION  06/20/2022   KIDNEY STONE SURGERY  1999   LUNG LOBECTOMY Left 04/06/2021   robotic left lower lobectomy 04/06/2021 Dr. Dorris Fetch for Stage 1A adenocarcinoma   NODE DISSECTION Left 04/06/2021   Procedure: NODE DISSECTION;  Surgeon: Loreli Slot, MD;  Location: [facility] OR;  Service: Thoracic;  Laterality: Left;   TUBAL LIGATION     VAGINAL HYSTERECTOMY     Menometrorrhagia. Excessive bleeding. Unknown if cervix removed.     SOCIAL HISTORY: Social History   Socioeconomic History   Marital status: Divorced    Spouse name: Not on file   Number of children: 2   Years of education: H/S   Highest education level: High school graduate  Occupational History   Occupation:  Disabled   Occupation: retired  Tobacco Use   Smoking status: Former    Packs/day: 0.25    Years: 44.50    Additional pack years: 0.00    Total pack years: 11.13    Types: Cigarettes    Quit date: 04/05/2021    Years since quitting: 2.1   Smokeless tobacco: Never   Tobacco comments:    12/23/19 states she quit for 3 months and then started back. Pt is going to talk with MD about this.  Vaping Use   Vaping Use: Never used  Substance and Sexual Activity   Alcohol use: Yes    Comment: rarely - once a year   Drug use: No   Sexual activity: Not on file  Other Topics Concern   Not on file  Social History Narrative   Hx of smoking; quit prior to lung surgery. Lives in [place]- self. No alcohol. Used to work in [employer], [employer]- Facilities manager. On disability sec to spinal pain.    Social Determinants of Health   Financial Resource Strain: Medium Risk (02/16/2023)   Overall Financial Resource Strain (CARDIA)    Difficulty of Paying Living Expenses: Somewhat hard  Food Insecurity: Patient Declined (02/16/2023)   Hunger Vital Sign    Worried About Running Out of Food in the Last Year: Patient declined    Barista  in the Last Year: Patient declined  Transportation Needs: No Transportation Needs (02/16/2023)   PRAPARE - Administrator, Civil Service (Medical): No    Lack of Transportation (Non-Medical): No  Physical Activity: Unknown (02/16/2023)   Exercise Vital Sign    Days of Exercise per Week: 0 days    Minutes of Exercise per Session: Patient declined  Stress: Stress Concern Present (02/16/2023)   [employer] of Occupational Health - Occupational Stress Questionnaire    Feeling of Stress : Rather much  Social Connections: Unknown (02/16/2023)   Social Connection and Isolation Panel [NHANES]    Frequency of Communication with Friends and Family: More than three times a week    Frequency of Social Gatherings with Friends and Family: Once a week    Attends Religious  Services: Not on Insurance claims handler of Clubs or Organizations: No    Attends Banker Meetings: Never    Marital Status: Patient declined  Intimate Partner Violence: Not At Risk (02/20/2023)   Humiliation, Afraid, Rape, and Kick questionnaire    Fear of Current or Ex-Partner: No    Emotionally Abused: No    Physically Abused: No    Sexually Abused: No    FAMILY HISTORY: Family History  Problem Relation Age of Onset   Hypertension Mother    Coronary artery disease Mother    Heart attack Mother        acute   Cancer Mother    Alcohol abuse Father    Depression Father    Hypertension Father    Heart attack Father 39       acute   Alcohol abuse Sister    Hyperlipidemia Sister    Hypertension Sister    Cancer Sister 76   Heart attack Sister        x's 2   Coronary artery disease Sister 28       x's 2   Breast cancer Sister 30    ALLERGIES:  is allergic to atorvastatin, omeprazole, and bupropion.  MEDICATIONS:  Current Outpatient Medications  Medication Sig Dispense Refill   allopurinol (ZYLOPRIM) 100 MG tablet Take 1 tablet (100 mg total) by mouth daily. 90 tablet 3   aspirin 81 MG tablet Take 81 mg by mouth daily.      bisoprolol (ZEBETA) 10 MG tablet Take 1 tablet (10 mg total) by mouth daily. 90 tablet 1   Cholecalciferol 25 MCG (1000 UT) capsule Take 1,000 Units by mouth daily.     clopidogrel (PLAVIX) 75 MG tablet Take 1 tablet (75 mg total) by mouth daily. 90 tablet 4   fluticasone (FLONASE) 50 MCG/ACT nasal spray Place into both nostrils daily.     HYDROcodone-acetaminophen (NORCO/VICODIN) 5-325 MG tablet Take 1-2 tablets by mouth every 6 (six) hours as needed for moderate pain. 20 tablet 0   ibuprofen (ADVIL) 200 MG tablet Take 400 mg by mouth 2 (two) times daily as needed (headaches).     lidocaine-prilocaine (EMLA) cream Apply on the port. 30 -45 min  prior to port access. 30 g 3   lisinopril-hydrochlorothiazide (ZESTORETIC) 20-12.5 MG tablet Take  1 tablet by mouth daily. 90 tablet 1   pantoprazole (PROTONIX) 20 MG tablet Take 1 tablet (20 mg total) by mouth daily. 90 tablet 1   PROAIR HFA 108 (90 Base) MCG/ACT inhaler Inhale 2 puffs into the lungs every 6 (six) hours as needed for wheezing or shortness of breath. 8.5 g 4   rosuvastatin (  CRESTOR) 20 MG tablet Take 1 tablet (20 mg total) by mouth daily. 90 tablet 1   Semaglutide, 1 MG/DOSE, (OZEMPIC, 1 MG/DOSE,) 4 MG/3ML SOPN Inject 1 mg into the skin once a week. 9 mL 1   diazepam (VALIUM) 5 MG tablet Take 1 tablet (5 mg total) by mouth every 12 (twelve) hours as needed. (Patient not taking: Reported on 02/28/2023) 30 tablet 0   No current facility-administered medications for this visit.   Facility-Administered Medications Ordered in Other Visits  Medication Dose Route Frequency Provider Last Rate Last Admin   heparin lock flush 100 UNIT/ML injection            heparin lock flush 100 unit/mL  500 Units Intracatheter Once PRN Earna Coder, MD        Mild maculopapular rash on the [place] area left more than right.  PHYSICAL EXAMINATION: ECOG PERFORMANCE STATUS: 1 - Symptomatic but completely ambulatory  Vitals:   05/21/23 1050  BP: 119/73  Pulse: 77  Temp: 97.6 F (36.4 C)  SpO2: 99%    Filed Weights   05/21/23 1050  Weight: 206 lb 3.2 oz (93.5 kg)     Physical Exam HENT:     Head: Normocephalic and atraumatic.     Mouth/Throat:     Pharynx: No oropharyngeal exudate.  Eyes:     Pupils: Pupils are equal, round, and reactive to light.  Cardiovascular:     Rate and Rhythm: Normal rate and regular rhythm.  Pulmonary:     Comments: Decreased breath sounds bilaterally.  No wheeze or crackles Abdominal:     General: Bowel sounds are normal. There is no distension.     Palpations: Abdomen is soft. There is no mass.     Tenderness: There is no abdominal tenderness. There is no guarding or rebound.  Musculoskeletal:        General: No tenderness. Normal range of  motion.     Cervical back: Normal range of motion and neck supple.  Skin:    General: Skin is warm.  Neurological:     Mental Status: She is alert and oriented to person, place, and time.  Psychiatric:        Mood and Affect: Affect normal.      LABORATORY DATA:  I have reviewed the data as listed Lab Results  Component Value Date   WBC 8.7 05/21/2023   HGB 14.9 05/21/2023   HCT 45.1 05/21/2023   MCV 85.4 05/21/2023   PLT 224 05/21/2023   Recent Labs    04/09/23 0823 05/01/23 0820 05/21/23 1025  NA 138 137 139  K 3.2* 3.5 3.7  CL 104 102 106  CO2 24 25 25   GLUCOSE 105* 105* 100*  BUN 20 19 20   CREATININE 0.72 0.65 0.69  CALCIUM 9.4 9.7 9.4  GFRNONAA >60 >60 >60  PROT 6.7 7.5 7.0  ALBUMIN 3.8 4.1 4.1  AST 20 22 20   ALT 12 15 15   ALKPHOS 88 87 92  BILITOT 0.6 0.6 0.9    RADIOGRAPHIC STUDIES: I have personally reviewed the radiological images as listed and agreed with the findings in the report. CT CHEST ABDOMEN PELVIS W CONTRAST  Result Date: 05/17/2023 CLINICAL DATA:  Metastatic non-small cell lung cancer, status post left lower lobectomy with adrenal metastasis, additional ovarian cystic lesion, assess treatment response * Tracking Code: BO * EXAM: CT CHEST, ABDOMEN, AND PELVIS WITH CONTRAST TECHNIQUE: Multidetector CT imaging of the chest, abdomen and pelvis was performed following the  standard protocol during bolus administration of intravenous contrast. RADIATION DOSE REDUCTION: This exam was performed according to the departmental dose-optimization program which includes automated exposure control, adjustment of the mA and/or kV according to patient size and/or use of iterative reconstruction technique. CONTRAST:  OMNIPAQUE IOHEXOL 300 MG/ML  SOLN COMPARISON:  CT chest angiogram, 01/14/2023, CT chest abdomen pelvis, 12/20/2022 FINDINGS: CT CHEST FINDINGS Cardiovascular: Right chest port catheter. Aortic atherosclerosis. Normal heart size. Three-vessel  coronary artery calcifications. No pericardial effusion. Mediastinum/Nodes: No enlarged mediastinal, hilar, or axillary lymph nodes. Thyroid gland, trachea, and esophagus demonstrate no significant findings. Lungs/Pleura: Status post left lower lobectomy. Diffuse bilateral bronchial wall thickening. Mild centrilobular and paraseptal emphysema. Unchanged small ground-glass nodules in the right lower lobe, including a 1.0 x 0.8 cm nodule in the dependent right lower lobe (series 4, image 100). Background of very fine centrilobular nodularity, most concentrated in the lung apices. No pleural effusion or pneumothorax. Musculoskeletal: No chest wall abnormality. No acute osseous findings. CT ABDOMEN PELVIS FINDINGS Hepatobiliary: No solid liver abnormality is seen. No gallstones, gallbladder wall thickening, or biliary dilatation. Pancreas: Unremarkable. No pancreatic ductal dilatation or surrounding inflammatory changes. Spleen: Normal in size without significant abnormality. Adrenals/Urinary Tract: No evident left adrenal mass or nodule (series 2, image 59). Normal right adrenal gland. Simple, benign right renal cortical cysts, for which no further follow-up or characterization is required. Kidneys are otherwise normal, without renal calculi, solid lesion, or hydronephrosis. Bladder is unremarkable. Stomach/Bowel: Stomach is within normal limits. Status post appendectomy. No evidence of bowel wall thickening, distention, or inflammatory changes. Descending and sigmoid diverticulosis. Moderate burden of stool throughout the colon and rectum. Vascular/Lymphatic: Severe aortic atherosclerosis. Aortobiiliac stent endograft repair. No enlarged abdominal or pelvic lymph nodes. Reproductive: Significant interval enlargement of a multi septated cystic lesion of the right ovary, now measuring 11.7 x 8.2 cm, previously 8.1 x 5.8 cm (series 2, image 103). Normal left ovary. Status post hysterectomy. Other: No abdominal wall  hernia or abnormality. No ascites. Musculoskeletal: No acute osseous findings. IMPRESSION: 1. Status post left lower lobectomy. No evidence of recurrent or metastatic disease in the chest. 2. Unchanged small ground-glass nodules in the right lower lobe, including a 1.0 x 0.8 cm nodule in the dependent right lower lobe. These remain nonspecific, possibly infectious or inflammatory although small metachronous adenocarcinoma not excluded. Attention on follow-up. 3. Left adrenal metastasis remains resolved following treatment. 4. Significant interval enlargement of a multiseptated cystic lesion of the right ovary, now measuring 11.7 x 8.2 cm, previously 8.1 x 5.8 cm. 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 5. Emphysema and diffuse bilateral bronchial wall thickening. Background of very fine centrilobular nodularity, consistent with smoking-related respiratory bronchiolitis. 6. Aortobiiliac stent endograft repair. 7. Coronary artery disease. Aortic Atherosclerosis (ICD10-I70.0) and Emphysema (ICD10-J43.9). Electronically Signed   By: Jearld Lesch M.D.   On: 05/17/2023 07:55     ASSESSMENT & PLAN:   Primary cancer of left lower lobe of lung (HCC) #JUNE 2023-STAGE IV--recurrent/metastatic disease with New left adrenal metastasis;  PET scan JUNE 20th- 2023-  Enlarged 4 cm hypermetabolic LEFT adrenal metastasis;  Suspicion of metastatic adenopathy to the LEFT hilum.  Part solid nodule in the RIGHT lower lobe without metabolic activity. 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 Currently on single agent Keytruda [PD-L1 greater than 80% ].  Currently on single agent Keytruda.   # CT scan- June 13th, 2024-  Status post left lower lobectomy. No evidence of recurrent or metastatic disease in the chest; Unchanged small ground-glass nodules in the right lower lobe, including a 1.0 x 0.8 cm nodule in  the dependent right lower lobe. hese remain nonspecific, possibly infectious or inflammatory although small metachronous adenocarcinoma not excluded. Left adrenal metastasis remains resolved following treatment.  See discussion regarding ovarian mass-right adnexal/see below  # Proceed of Keytruda single agent. Labs today reviewed;  acceptable for treatment today. Tolerating well except for mild chills. TSH- March 2024- WNL. Stable; see below re: ovarian mass.  # Right mildly complex cystic adnexal mass noted- s/p prior pelvic ultrasound for further work-up [s/p Gyn-Onc; Dr.Berchuck; felt high risk for surgery] June 13th- Significant interval enlargement of a multiseptated cystic lesion of the right ovary, now measuring 11.7 x 8.2 cm, previously 8.1 x 5.8 cm. 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   Discussed with gyn-Onc. Awaiting evaluation with Dr.Secord gyn-Onc- on July 3rd 2024.  Discussed with the patient that if she had to have gynecologic surgery-I think is reasonable to hold off immunotherapy as the lung cancer is clinically stable.   # mild Hypokalemia: discussed dietary supplement. stable  # Chronic pain: headaches/cervical pain/back pain [Dr.Naveira; pain doctor]  On NSAIDs [ monitor for now. OCT 2023- MRI brain- NEGATIVE for any metastatic disease.   Stable.  FEB 2024- MRI lumbar spine- degenerative disease- on ibuprofen prn- BID. stable  # Left rib pain-Post throacotomy pain/tingling and numbness:  Continue gabapentin -300 mg TID; and then at extra at night prn. stable  # CAD/ PVD- cramping- s/p evaluation [Dr.Schneir] dec 2023 stable  # COPD-stable encouraged continue to avoid smoking; again counseled to quit smoking; recommend evaluation with pulmonary- stable.   # Left eye cataract - in June, 10th 2024; Right July 24th- pending.   #Incidental findings on Imaging  CT , 2024: Emphysema; Aortobiiliac stent endograft repair; Coronary artery disease; Aortic Atherosclerosis; I  reviewed/discussed/counseled the patient.  Unfortunately continues to smoke in spite of smoking cessation counseling.  # IV Access :s/p  port placement- stable  *AM appts- Monday-   # DISPOSITION:  # keytruda today; # Follow-up in 3 weeks-APP-  labs/port- cbc/cmp;TSH Keytruda # Follow-up in 6 weeks-MD labs/port- cbc/cmp; Keytruda -  Dr. B  # I reviewed the blood work- with the patient in detail; also reviewed the imaging independently [as summarized above]; and with the patient in detail.   All questions were answered. The patient knows to call the clinic with any problems, questions or concerns.    Earna Coder, MD 05/21/2023 12:35 PM

## 2023-05-21 NOTE — Assessment & Plan Note (Addendum)
#  JUNE 2023-STAGE IV--recurrent/metastatic disease with New left adrenal metastasis;  PET scan JUNE 20th- 2023-  Enlarged 4 cm hypermetabolic LEFT adrenal metastasis;  Suspicion of metastatic adenopathy to the LEFT hilum.  Part solid nodule in the RIGHT lower lobe without metabolic activity. 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 Currently on single agent Keytruda [PD-L1 greater than 80% ].  Currently on single agent Keytruda.   # CT scan- June 13th, 2024- Status post left lower lobectomy. No evidence of recurrent or metastatic disease in the chest; Unchanged small ground-glass nodules in the right lower lobe, including a 1.0 x 0.8 cm nodule in the dependent right lower lobe. hese remain nonspecific, possibly infectious or inflammatory although small metachronous adenocarcinoma not excluded. Left adrenal metastasis remains resolved following treatment.  See discussion regarding ovarian mass-right adnexal/see below  # Proceed of Keytruda single agent. Labs today reviewed;  acceptable for treatment today. Tolerating well except for mild chills. TSH- March 2024- WNL. Stable; see below re: ovarian mass.  # Right mildly complex cystic adnexal mass noted- s/p prior pelvic ultrasound for further work-up [s/p Gyn-Onc; Dr.Berchuck; felt high risk for surgery] June 13th- Significant interval enlargement of a multiseptated cystic lesion of the right ovary, now measuring 11.7 x 8.2 cm, previously 8.1 x 5.8 cm. 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   Discussed with gyn-Onc. Awaiting evaluation with Dr.Secord gyn-Onc- on July 3rd 2024.  Discussed with the patient that if she had to have gynecologic surgery-I think is reasonable to hold off immunotherapy as the lung cancer is clinically stable.   # mild Hypokalemia: discussed dietary supplement. stable  # Chronic pain: headaches/cervical  pain/back pain [Dr.Naveira; pain doctor]  On NSAIDs [ monitor for now. OCT 2023- MRI brain- NEGATIVE for any metastatic disease.   Stable.  FEB 2024- MRI lumbar spine- degenerative disease- on ibuprofen prn- BID. stable  # Left rib pain-Post throacotomy pain/tingling and numbness:  Continue gabapentin -300 mg TID; and then at extra at night prn. stable  # CAD/ PVD- cramping- s/p evaluation [Dr.Schneir] dec 2023 stable  # COPD-stable encouraged continue to avoid smoking; again counseled to quit smoking; recommend evaluation with pulmonary- stable.   # Left eye cataract - in June, 10th 2024; Right July 24th- pending.   #Incidental findings on Imaging  CT , 2024: Emphysema; Aortobiiliac stent endograft repair; Coronary artery disease; Aortic Atherosclerosis; I reviewed/discussed/counseled the patient.  Unfortunately continues to smoke in spite of smoking cessation counseling.  # IV Access :s/p  port placement- stable  *AM appts- Monday-   # DISPOSITION:  # keytruda today; # Follow-up in 3 weeks-APP-  labs/port- cbc/cmp;TSH Keytruda # Follow-up in 6 weeks-MD labs/port- cbc/cmp; Keytruda -  Dr. B  # I reviewed the blood work- with the patient in detail; also reviewed the imaging independently [as summarized above]; and with the patient in detail.

## 2023-05-21 NOTE — Patient Instructions (Signed)
[facility]  Discharge Instructions: Thank you for choosing [facility] to provide your oncology and hematology care.  If you have a lab appointment with the Cancer Center, please go directly to the Cancer Center and check in at the registration area.  Wear comfortable clothing and clothing appropriate for easy access to any Portacath or PICC line.   We strive to give you quality time with your provider. You may need to reschedule your appointment if you arrive late (15 or more minutes).  Arriving late affects you and other patients whose appointments are after yours.  Also, if you miss three or more appointments without notifying the office, you may be dismissed from the clinic at the provider's discretion.      For prescription refill requests, have your pharmacy contact our office and allow 72 hours for refills to be completed.    Today you received the following chemotherapy and/or immunotherapy agents- Keytruda      To help prevent nausea and vomiting after your treatment, we encourage you to take your nausea medication as directed.  BELOW ARE SYMPTOMS THAT SHOULD BE REPORTED IMMEDIATELY: *FEVER GREATER THAN 100.4 F (38 C) OR HIGHER *CHILLS OR SWEATING *NAUSEA AND VOMITING THAT IS NOT CONTROLLED WITH YOUR NAUSEA MEDICATION *UNUSUAL SHORTNESS OF BREATH *UNUSUAL BRUISING OR BLEEDING *URINARY PROBLEMS (pain or burning when urinating, or frequent urination) *BOWEL PROBLEMS (unusual diarrhea, constipation, pain near the anus) TENDERNESS IN MOUTH AND THROAT WITH OR WITHOUT PRESENCE OF ULCERS (sore throat, sores in mouth, or a toothache) UNUSUAL RASH, SWELLING OR PAIN  UNUSUAL VAGINAL DISCHARGE OR ITCHING   Items with * indicate a potential emergency and should be followed up as soon as possible or go to the Emergency Department if any problems should occur.  Please show the CHEMOTHERAPY ALERT CARD or IMMUNOTHERAPY ALERT CARD at check-in to  the Emergency Department and triage nurse.  Should you have questions after your visit or need to cancel or reschedule your appointment, please contact [facility] [place] [facility]  [phone] and follow the prompts.  Office hours are 8:00 a.m. to 4:30 p.m. Monday - Friday. Please note that voicemails left after 4:00 p.m. may not be returned until the following business day.  We are closed weekends and major holidays. You have access to a nurse at all times for urgent questions. Please call the main number to the clinic [phone] and follow the prompts.  For any non-urgent questions, you may also contact your provider using MyChart. We now offer e-Visits for anyone 18 and older to request care online for non-urgent symptoms. For details visit [email or web address].   Also download the MyChart app! Go to the app store, search "MyChart", open the app, select [place], and log in with your MyChart username and password.   

## 2023-05-30 ENCOUNTER — Ambulatory Visit: Payer: Medicare Other

## 2023-06-06 ENCOUNTER — Inpatient Hospital Stay: Payer: Medicare Other

## 2023-06-11 ENCOUNTER — Other Ambulatory Visit: Payer: Self-pay | Admitting: *Deleted

## 2023-06-11 ENCOUNTER — Inpatient Hospital Stay: Payer: Medicare Other | Attending: Internal Medicine

## 2023-06-11 ENCOUNTER — Inpatient Hospital Stay: Payer: Medicare Other

## 2023-06-11 ENCOUNTER — Encounter: Payer: Self-pay | Admitting: Nurse Practitioner

## 2023-06-11 ENCOUNTER — Inpatient Hospital Stay (HOSPITAL_BASED_OUTPATIENT_CLINIC_OR_DEPARTMENT_OTHER): Payer: Medicare Other | Admitting: Nurse Practitioner

## 2023-06-11 VITALS — BP 118/81 | HR 76 | Temp 96.6°F | Wt 204.0 lb

## 2023-06-11 DIAGNOSIS — F17210 Nicotine dependence, cigarettes, uncomplicated: Secondary | ICD-10-CM | POA: Insufficient documentation

## 2023-06-11 DIAGNOSIS — Z5112 Encounter for antineoplastic immunotherapy: Secondary | ICD-10-CM | POA: Diagnosis not present

## 2023-06-11 DIAGNOSIS — Z79899 Other long term (current) drug therapy: Secondary | ICD-10-CM | POA: Insufficient documentation

## 2023-06-11 DIAGNOSIS — C3432 Malignant neoplasm of lower lobe, left bronchus or lung: Secondary | ICD-10-CM

## 2023-06-11 DIAGNOSIS — C7972 Secondary malignant neoplasm of left adrenal gland: Secondary | ICD-10-CM | POA: Diagnosis not present

## 2023-06-11 DIAGNOSIS — J438 Other emphysema: Secondary | ICD-10-CM

## 2023-06-11 DIAGNOSIS — Z90710 Acquired absence of both cervix and uterus: Secondary | ICD-10-CM | POA: Diagnosis not present

## 2023-06-11 DIAGNOSIS — R1900 Intra-abdominal and pelvic swelling, mass and lump, unspecified site: Secondary | ICD-10-CM | POA: Diagnosis not present

## 2023-06-11 LAB — CBC WITH DIFFERENTIAL/PLATELET
Abs Immature Granulocytes: 0.02 10*3/uL (ref 0.00–0.07)
Basophils Absolute: 0.1 10*3/uL (ref 0.0–0.1)
Basophils Relative: 1 %
Eosinophils Absolute: 0.5 10*3/uL (ref 0.0–0.5)
Eosinophils Relative: 6 %
HCT: 44.6 % (ref 36.0–46.0)
Hemoglobin: 15 g/dL (ref 12.0–15.0)
Immature Granulocytes: 0 %
Lymphocytes Relative: 35 %
Lymphs Abs: 3.1 10*3/uL (ref 0.7–4.0)
MCH: 28.3 pg (ref 26.0–34.0)
MCHC: 33.6 g/dL (ref 30.0–36.0)
MCV: 84.2 fL (ref 80.0–100.0)
Monocytes Absolute: 0.6 10*3/uL (ref 0.1–1.0)
Monocytes Relative: 7 %
Neutro Abs: 4.5 10*3/uL (ref 1.7–7.7)
Neutrophils Relative %: 51 %
Platelets: 201 10*3/uL (ref 150–400)
RBC: 5.3 MIL/uL — ABNORMAL HIGH (ref 3.87–5.11)
RDW: 14.1 % (ref 11.5–15.5)
WBC: 8.7 10*3/uL (ref 4.0–10.5)
nRBC: 0 % (ref 0.0–0.2)

## 2023-06-11 LAB — COMPREHENSIVE METABOLIC PANEL
ALT: 13 U/L (ref 0–44)
AST: 20 U/L (ref 15–41)
Albumin: 3.9 g/dL (ref 3.5–5.0)
Alkaline Phosphatase: 83 U/L (ref 38–126)
Anion gap: 8 (ref 5–15)
BUN: 16 mg/dL (ref 8–23)
CO2: 25 mmol/L (ref 22–32)
Calcium: 9.5 mg/dL (ref 8.9–10.3)
Chloride: 107 mmol/L (ref 98–111)
Creatinine, Ser: 0.66 mg/dL (ref 0.44–1.00)
GFR, Estimated: >60 mL/min (ref >60)
Glucose, Bld: 103 mg/dL — ABNORMAL HIGH (ref 70–99)
Potassium: 3.6 mmol/L (ref 3.5–5.1)
Sodium: 140 mmol/L (ref 135–145)
Total Bilirubin: 0.6 mg/dL (ref 0.3–1.2)
Total Protein: 6.5 g/dL (ref 6.5–8.1)

## 2023-06-11 MED ORDER — HEPARIN SOD (PORK) LOCK FLUSH 100 UNIT/ML IV SOLN
500.0000 [IU] | Freq: Once | INTRAVENOUS | Status: AC | PRN
  Administered 2023-06-11: 500 [IU]
  Filled 2023-06-11: qty 5

## 2023-06-11 MED ORDER — SODIUM CHLORIDE 0.9 % IV SOLN
Freq: Once | INTRAVENOUS | Status: AC
  Filled 2023-06-11: qty 250

## 2023-06-11 MED ORDER — SODIUM CHLORIDE 0.9 % IV SOLN
200.0000 mg | Freq: Once | INTRAVENOUS | Status: AC
  Administered 2023-06-11: 200 mg via INTRAVENOUS
  Filled 2023-06-11: qty 200

## 2023-06-11 NOTE — Patient Instructions (Signed)
[facility]  Discharge Instructions: Thank you for choosing [facility] to provide your oncology and hematology care.  If you have a lab appointment with the Cancer Center, please go directly to the Cancer Center and check in at the registration area.  Wear comfortable clothing and clothing appropriate for easy access to any Portacath or PICC line.   We strive to give you quality time with your provider. You may need to reschedule your appointment if you arrive late (15 or more minutes).  Arriving late affects you and other patients whose appointments are after yours.  Also, if you miss three or more appointments without notifying the office, you may be dismissed from the clinic at the provider's discretion.      For prescription refill requests, have your pharmacy contact our office and allow 72 hours for refills to be completed.    Today you received the following chemotherapy and/or immunotherapy agents- Keytruda      To help prevent nausea and vomiting after your treatment, we encourage you to take your nausea medication as directed.  BELOW ARE SYMPTOMS THAT SHOULD BE REPORTED IMMEDIATELY: *FEVER GREATER THAN 100.4 F (38 C) OR HIGHER *CHILLS OR SWEATING *NAUSEA AND VOMITING THAT IS NOT CONTROLLED WITH YOUR NAUSEA MEDICATION *UNUSUAL SHORTNESS OF BREATH *UNUSUAL BRUISING OR BLEEDING *URINARY PROBLEMS (pain or burning when urinating, or frequent urination) *BOWEL PROBLEMS (unusual diarrhea, constipation, pain near the anus) TENDERNESS IN MOUTH AND THROAT WITH OR WITHOUT PRESENCE OF ULCERS (sore throat, sores in mouth, or a toothache) UNUSUAL RASH, SWELLING OR PAIN  UNUSUAL VAGINAL DISCHARGE OR ITCHING   Items with * indicate a potential emergency and should be followed up as soon as possible or go to the Emergency Department if any problems should occur.  Please show the CHEMOTHERAPY ALERT CARD or IMMUNOTHERAPY ALERT CARD at check-in to  the Emergency Department and triage nurse.  Should you have questions after your visit or need to cancel or reschedule your appointment, please contact [facility]  [phone] and follow the prompts.  Office hours are 8:00 a.m. to 4:30 p.m. Monday - Friday. Please note that voicemails left after 4:00 p.m. may not be returned until the following business day.  We are closed weekends and major holidays. You have access to a nurse at all times for urgent questions. Please call the main number to the clinic [phone] and follow the prompts.  For any non-urgent questions, you may also contact your provider using MyChart. We now offer e-Visits for anyone 18 and older to request care online for non-urgent symptoms. For details visit [email or web address].   Also download the MyChart app! Go to the app store, search "MyChart", open the app, select [facility], and log in with your MyChart username and password.   

## 2023-06-11 NOTE — Progress Notes (Signed)
[facility] CONSULT NOTE  Patient Care Team: Malva Limes, MD as PCP - General (Family Medicine) Lady Gary Darlin Priestly, MD as Consulting Physician (Cardiology) Schnier, Latina Craver, MD (Vascular Surgery) Lonell Face, MD as Consulting Physician (Neurology) Delano Metz, MD as Referring Physician (Pain Medicine) Pa, [facility] (Optometry) Lady Gary, Darlin Priestly, MD as Consulting Physician (Cardiology) Pasty Spillers, MD (Inactive) as Consulting Physician (Gastroenterology) Nadara Mustard, MD as Referring Physician (Obstetrics and Gynecology) Glory Buff, RN as Oncology Nurse Navigator Earna Coder, MD as Consulting Physician (Oncology)   CHIEF COMPLAINTS/PURPOSE OF CONSULTATION: Lung cancer  #  Oncology History Overview Note  #MAY 2022- LLL nodule 2.3 cm Adenocarcinoma Stage IA; s/p lobectomy.  No adjuvant therapy  # CT scan 4th June 2023-highly concerning for recurrent/metastatic disease with-. New left adrenal metastasis;  Ground-glass and part solid nodules in the peripheral right lower lobe, unchanged from 11/07/2021 but slightly enlarged from 01/01/2018. Findings are suspicious for indolent adenocarcinoma.  F One- PFL1 =80%; KRAS G12C PET scan JUNE 20th- 2023-  Enlarged 4 cm hypermetabolic LEFT adrenal metastasis;  Suspicion of metastatic adenopathy to the LEFT hilum.  Part solid nodule in the RIGHT lower lobe without metabolic activity.   3 ADRENAL BIOPSY- CK7 POSITIVE ADENOCARCINOMA- There is limited tissue remaining for ancillary testing, not likely  sufficient for NGS testing.   # AUG 7th, 2023-single agent Keytruda.    Primary cancer of left lower lobe of lung (HCC)  05/09/2021 Initial Diagnosis   Primary cancer of left lower lobe of lung (HCC)   07/04/2022 -  Chemotherapy   Patient is on Treatment Plan : LUNG NSCLC Pembrolizumab (200) q21d     07/10/2022 - 07/10/2022 Chemotherapy   Patient is on Treatment Plan : LUNG NSCLC Pembrolizumab  (200) q21d      HISTORY OF PRESENTING ILLNESS: Patient  ambulating.  Alone.   Laura Nielsen 67 y.o. female with history of smoking, prior hx of lung cancer with likely recurrence to left adrenal gland, s/p bx CK-7 positive TTF-1 negative], clinically suggestive of recurrent lung cancer, on single agent Rande Lawman is here for follow-up and continuation of treatment. She is scheduled to see gyn onc in a couple of weeks for discussion of surgery of adnexal mass. Awaiting second cataract surgery. Otherwise feels at baseline and denies complaints. Continues to smoke which she says is only thing that brings her joy. She's worried about pelvic mass enlarging and wants to have surgery. Says her functional status limits her from being active. Has chronic pain which she self manages. Has not seen pulmonary in some time. Not on inhalers.    Review of Systems  Constitutional:  Positive for chills and malaise/fatigue. Negative for diaphoresis, fever and weight loss.  HENT:  Negative for nosebleeds and sore throat.   Eyes:  Negative for double vision.  Respiratory:  Negative for cough, hemoptysis, sputum production, shortness of breath and wheezing.   Cardiovascular:  Negative for palpitations, orthopnea and leg swelling.  Gastrointestinal:  Positive for constipation. Negative for abdominal pain, blood in stool, diarrhea, heartburn, melena, nausea and vomiting.  Genitourinary:  Negative for dysuria, frequency and urgency.  Musculoskeletal:  Positive for back pain and joint pain.  Skin: Negative.  Negative for itching and rash.  Neurological:  Positive for tingling. Negative for dizziness, focal weakness, weakness and headaches.  Endo/Heme/Allergies:  Does not bruise/bleed easily.  Psychiatric/Behavioral:  Negative for depression. The patient is not nervous/anxious and does not have insomnia.  MEDICAL HISTORY:  Past Medical History:  Diagnosis Date   AAA (abdominal aortic aneurysm) (HCC)    s/p EVAR AAA  03/27/13   Arthritis    Asthma    Cancer (HCC)    Complication of anesthesia    BP drops after   Coronary artery disease    History of kidney stones    Osteoporosis    Sleep apnea     SURGICAL HISTORY: Past Surgical History:  Procedure Laterality Date   ABDOMINAL AORTIC ENDOVASCULAR STENT GRAFT  03/27/2013   Dr. Levora Dredge   Cardiac catheterization  02/2009   70-80% stenosis RCA stent placed. started on Plavix   CARDIAC CATHETERIZATION     CATARACT EXTRACTION W/PHACO Left 05/14/2023   Procedure: CATARACT EXTRACTION PHACO AND INTRAOCULAR LENS PLACEMENT (IOC) LEFT DIABETIC  OMIDRIA  19.40  01:34.8;  Surgeon: Estanislado Pandy, MD;  Location: [facility];  Service: Ophthalmology;  Laterality: Left;   COLONOSCOPY WITH PROPOFOL N/A 01/05/2021   Procedure: COLONOSCOPY WITH PROPOFOL;  Surgeon: Pasty Spillers, MD;  Location: [facility];  Service: Endoscopy;  Laterality: N/A;   CORONARY ANGIOPLASTY     stent placed   ESOPHAGOGASTRODUODENOSCOPY (EGD) WITH PROPOFOL N/A 01/05/2021   Procedure: ESOPHAGOGASTRODUODENOSCOPY (EGD) WITH PROPOFOL;  Surgeon: Pasty Spillers, MD;  Location: [facility];  Service: Endoscopy;  Laterality: N/A;   INTERCOSTAL NERVE BLOCK Left 04/06/2021   Procedure: INTERCOSTAL NERVE BLOCK;  Surgeon: Loreli Slot, MD;  Location[facility] OR;  Service: Thoracic;  Laterality: Left;   IR IMAGING GUIDED PORT INSERTION  06/20/2022   KIDNEY STONE SURGERY  1999   LUNG LOBECTOMY Left 04/06/2021   robotic left lower lobectomy 04/06/2021 Dr. Dorris Fetch for Stage 1A adenocarcinoma   NODE DISSECTION Left 04/06/2021   Procedure: NODE DISSECTION;  Surgeon: Loreli Slot, MD;  Location: [facility] OR;  Service: Thoracic;  Laterality: Left;   TUBAL LIGATION     VAGINAL HYSTERECTOMY     Menometrorrhagia. Excessive bleeding. Unknown if cervix removed.     SOCIAL HISTORY: Social History   Socioeconomic History   Marital status: Divorced    Spouse  name: Not on file   Number of children: 2   Years of education: H/S   Highest education level: High school graduate  Occupational History   Occupation: Disabled   Occupation: retired  Tobacco Use   Smoking status: Every Day    Packs/day: 0.25    Years: 44.50    Additional pack years: 0.00    Total pack years: 11.13    Types: Cigarettes   Smokeless tobacco: Never   Tobacco comments:    12/23/19 states she quit for 3 months and then started back.   Vaping Use   Vaping Use: Never used  Substance and Sexual Activity   Alcohol use: Yes    Comment: rarely - once a year   Drug use: No   Sexual activity: Not on file  Other Topics Concern   Not on file  Social History Narrative   Hx of smoking; quit prior to lung surgery then started back. Lives in [place] alone. Used to work in [employer], [employer]- Facilities manager. On disability sec to spinal pain.    Social Determinants of Health   Financial Resource Strain: Medium Risk (02/16/2023)   Overall Financial Resource Strain (CARDIA)    Difficulty of Paying Living Expenses: Somewhat hard  Food Insecurity: Patient Declined (02/16/2023)   Hunger Vital Sign    Worried About Running Out of Food in the Last  Year: Patient declined    Barista in the Last Year: Patient declined  Transportation Needs: No Transportation Needs (02/16/2023)   PRAPARE - Administrator, Civil Service (Medical): No    Lack of Transportation (Non-Medical): No  Physical Activity: Unknown (02/16/2023)   Exercise Vital Sign    Days of Exercise per Week: 0 days    Minutes of Exercise per Session: Patient declined  Stress: Stress Concern Present (02/16/2023)   [employer] of Occupational Health - Occupational Stress Questionnaire    Feeling of Stress : Rather much  Social Connections: Unknown (02/16/2023)   Social Connection and Isolation Panel [NHANES]    Frequency of Communication with Friends and Family: More than three times a week    Frequency of Social  Gatherings with Friends and Family: Once a week    Attends Religious Services: Not on Insurance claims handler of Clubs or Organizations: No    Attends Banker Meetings: Never    Marital Status: Patient declined  Intimate Partner Violence: Not At Risk (02/20/2023)   Humiliation, Afraid, Rape, and Kick questionnaire    Fear of Current or Ex-Partner: No    Emotionally Abused: No    Physically Abused: No    Sexually Abused: No    FAMILY HISTORY: Family History  Problem Relation Age of Onset   Hypertension Mother    Coronary artery disease Mother    Heart attack Mother        acute   Cancer Mother    Alcohol abuse Father    Depression Father    Hypertension Father    Heart attack Father 86       acute   Alcohol abuse Sister    Hyperlipidemia Sister    Hypertension Sister    Cancer Sister 65   Heart attack Sister        x's 2   Coronary artery disease Sister 86       x's 2   Breast cancer Sister 11    ALLERGIES:  is allergic to atorvastatin, omeprazole, and bupropion.  MEDICATIONS:  Current Outpatient Medications  Medication Sig Dispense Refill   allopurinol (ZYLOPRIM) 100 MG tablet Take 1 tablet (100 mg total) by mouth daily. 90 tablet 3   aspirin 81 MG tablet Take 81 mg by mouth daily.      bisoprolol (ZEBETA) 10 MG tablet Take 1 tablet (10 mg total) by mouth daily. 90 tablet 1   Cholecalciferol 25 MCG (1000 UT) capsule Take 1,000 Units by mouth daily.     clopidogrel (PLAVIX) 75 MG tablet Take 1 tablet (75 mg total) by mouth daily. 90 tablet 4   fluticasone (FLONASE) 50 MCG/ACT nasal spray Place into both nostrils daily.     HYDROcodone-acetaminophen (NORCO/VICODIN) 5-325 MG tablet Take 1-2 tablets by mouth every 6 (six) hours as needed for moderate pain. 20 tablet 0   ibuprofen (ADVIL) 200 MG tablet Take 400 mg by mouth 2 (two) times daily as needed (headaches).     lidocaine-prilocaine (EMLA) cream Apply on the port. 30 -45 min  prior to port access. 30 g 3    lisinopril-hydrochlorothiazide (ZESTORETIC) 20-12.5 MG tablet Take 1 tablet by mouth daily. 90 tablet 1   pantoprazole (PROTONIX) 20 MG tablet Take 1 tablet (20 mg total) by mouth daily. 90 tablet 1   PROAIR HFA 108 (90 Base) MCG/ACT inhaler Inhale 2 puffs into the lungs every 6 (six) hours as needed for wheezing  or shortness of breath. 8.5 g 4   rosuvastatin (CRESTOR) 20 MG tablet Take 1 tablet (20 mg total) by mouth daily. 90 tablet 1   Semaglutide, 1 MG/DOSE, (OZEMPIC, 1 MG/DOSE,) 4 MG/3ML SOPN Inject 1 mg into the skin once a week. 9 mL 1   diazepam (VALIUM) 5 MG tablet Take 1 tablet (5 mg total) by mouth every 12 (twelve) hours as needed. (Patient not taking: Reported on 02/28/2023) 30 tablet 0   No current facility-administered medications for this visit.   Facility-Administered Medications Ordered in Other Visits  Medication Dose Route Frequency Provider Last Rate Last Admin   heparin lock flush 100 UNIT/ML injection            heparin lock flush 100 unit/mL  500 Units Intracatheter Once PRN Earna Coder, MD       pembrolizumab [facility]) 200 mg in sodium chloride 0.9 % 50 mL chemo infusion  200 mg Intravenous Once Earna Coder, MD        PHYSICAL EXAMINATION: ECOG PERFORMANCE STATUS: 1 - Symptomatic but completely ambulatory  Vitals:   06/11/23 0827  BP: 118/81  Pulse: 76  Temp: (!) 96.6 F (35.9 C)  SpO2: 99%   Filed Weights   06/11/23 0827  Weight: 204 lb (92.5 kg)   Physical Exam Constitutional:      Appearance: She is not ill-appearing.  Eyes:     General: No scleral icterus.    Conjunctiva/sclera: Conjunctivae normal.  Cardiovascular:     Rate and Rhythm: Normal rate and regular rhythm.  Abdominal:     General: There is no distension.     Palpations: Abdomen is soft.     Tenderness: There is no abdominal tenderness. There is no guarding.  Musculoskeletal:        General: No deformity.     Right lower leg: No edema.     Left lower leg: No  edema.  Lymphadenopathy:     Cervical: No cervical adenopathy.  Skin:    General: Skin is warm and dry.  Neurological:     Mental Status: She is alert and oriented to person, place, and time. Mental status is at baseline.  Psychiatric:        Mood and Affect: Mood normal.        Behavior: Behavior normal.      LABORATORY DATA:  I have reviewed the data as listed Lab Results  Component Value Date   WBC 8.7 06/11/2023   HGB 15.0 06/11/2023   HCT 44.6 06/11/2023   MCV 84.2 06/11/2023   PLT 201 06/11/2023   Recent Labs    05/01/23 0820 05/21/23 1025 06/11/23 0806  NA 137 139 140  K 3.5 3.7 3.6  CL 102 106 107  CO2 25 25 25   GLUCOSE 105* 100* 103*  BUN 19 20 16   CREATININE 0.65 0.69 0.66  CALCIUM 9.7 9.4 9.5  GFRNONAA >60 >60 >60  PROT 7.5 7.0 6.5  ALBUMIN 4.1 4.1 3.9  AST 22 20 20   ALT 15 15 13   ALKPHOS 87 92 83  BILITOT 0.6 0.9 0.6    RADIOGRAPHIC STUDIES: I have personally reviewed the radiological images as listed and agreed with the findings in the report. CT CHEST ABDOMEN PELVIS W CONTRAST  Result Date: 05/17/2023 CLINICAL DATA:  Metastatic non-small cell lung cancer, status post left lower lobectomy with adrenal metastasis, additional ovarian cystic lesion, assess treatment response * Tracking Code: BO * EXAM: CT CHEST, ABDOMEN, AND PELVIS WITH  CONTRAST TECHNIQUE: Multidetector CT imaging of the chest, abdomen and pelvis was performed following the standard protocol during bolus administration of intravenous contrast. RADIATION DOSE REDUCTION: This exam was performed according to the departmental dose-optimization program which includes automated exposure control, adjustment of the mA and/or kV according to patient size and/or use of iterative reconstruction technique. CONTRAST:  OMNIPAQUE IOHEXOL 300 MG/ML  SOLN COMPARISON:  CT chest angiogram, 01/14/2023, CT chest abdomen pelvis, 12/20/2022 FINDINGS: CT CHEST FINDINGS Cardiovascular: Right chest port  catheter. Aortic atherosclerosis. Normal heart size. Three-vessel coronary artery calcifications. No pericardial effusion. Mediastinum/Nodes: No enlarged mediastinal, hilar, or axillary lymph nodes. Thyroid gland, trachea, and esophagus demonstrate no significant findings. Lungs/Pleura: Status post left lower lobectomy. Diffuse bilateral bronchial wall thickening. Mild centrilobular and paraseptal emphysema. Unchanged small ground-glass nodules in the right lower lobe, including a 1.0 x 0.8 cm nodule in the dependent right lower lobe (series 4, image 100). Background of very fine centrilobular nodularity, most concentrated in the lung apices. No pleural effusion or pneumothorax. Musculoskeletal: No chest wall abnormality. No acute osseous findings. CT ABDOMEN PELVIS FINDINGS Hepatobiliary: No solid liver abnormality is seen. No gallstones, gallbladder wall thickening, or biliary dilatation. Pancreas: Unremarkable. No pancreatic ductal dilatation or surrounding inflammatory changes. Spleen: Normal in size without significant abnormality. Adrenals/Urinary Tract: No evident left adrenal mass or nodule (series 2, image 59). Normal right adrenal gland. Simple, benign right renal cortical cysts, for which no further follow-up or characterization is required. Kidneys are otherwise normal, without renal calculi, solid lesion, or hydronephrosis. Bladder is unremarkable. Stomach/Bowel: Stomach is within normal limits. Status post appendectomy. No evidence of bowel wall thickening, distention, or inflammatory changes. Descending and sigmoid diverticulosis. Moderate burden of stool throughout the colon and rectum. Vascular/Lymphatic: Severe aortic atherosclerosis. Aortobiiliac stent endograft repair. No enlarged abdominal or pelvic lymph nodes. Reproductive: Significant interval enlargement of a multi septated cystic lesion of the right ovary, now measuring 11.7 x 8.2 cm, previously 8.1 x 5.8 cm (series 2, image 103). Normal  left ovary. Status post hysterectomy. Other: No abdominal wall hernia or abnormality. No ascites. Musculoskeletal: No acute osseous findings. IMPRESSION: 1. Status post left lower lobectomy. No evidence of recurrent or metastatic disease in the chest. 2. Unchanged small ground-glass nodules in the right lower lobe, including a 1.0 x 0.8 cm nodule in the dependent right lower lobe. These remain nonspecific, possibly infectious or inflammatory although small metachronous adenocarcinoma not excluded. Attention on follow-up. 3. Left adrenal metastasis remains resolved following treatment. 4. Significant interval enlargement of a multiseptated cystic lesion of the right ovary, now measuring 11.7 x 8.2 cm, previously 8.1 x 5.8 cm. 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 5. Emphysema and diffuse bilateral bronchial wall thickening. Background of very fine centrilobular nodularity, consistent with smoking-related respiratory bronchiolitis. 6. Aortobiiliac stent endograft repair. 7. Coronary artery disease. Aortic Atherosclerosis (ICD10-I70.0) and Emphysema (ICD10-J43.9). Electronically Signed   By: Jearld Lesch M.D.   On: 05/17/2023 07:55     ASSESSMENT & PLAN:   JUNE 2023-STAGE IV--recurrent/metastatic disease with new left adrenal metastasis;  PET scan JUNE 20th- 2023-  Enlarged 4 cm hypermetabolic LEFT adrenal metastasis;  Suspicion of metastatic adenopathy to the LEFT hilum.  Part solid nodule in the RIGHT lower lobe without metabolic activity. 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 Currently on single agent Keytruda [PD-L1 greater than 80% ].  Currently on single agent Keytruda. CT scan-  June 13th, 2024- Status post left lower lobectomy. No evidence of recurrent or metastatic disease in the chest; Unchanged small ground-glass nodules in the right lower lobe, including a 1.0 x 0.8 cm nodule  in the dependent right lower lobe. hese remain nonspecific, possibly infectious or inflammatory although small metachronous adenocarcinoma not excluded. Left adrenal metastasis remains resolved following treatment. Enlarging ovarian mass (below). Labs reviewed and acceptable for treatment. Proceed with [place] today. TSH 1.962 (May 2024).  Right mildly complex cystic adnexal mass noted- s/p prior pelvic ultrasound for further work-up [s/p Gyn-Onc; Dr.Berchuck; felt high risk for surgery] June 13th- Significant interval enlargement of a multiseptated cystic lesion of the right ovary, now measuring 11.7 x 8.2 cm, previously 8.1 x 5.8 cm. 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   Awaiting eval with gyn onc in July.  Discussed with the patient that if she had to have gynecologic surgery- I think is reasonable to hold off immunotherapy as the lung cancer is clinically stable.  Mild Hypokalemia: discussed dietary supplement. Stable Chronic pain: headaches/cervical pain/back pain [Dr.Naveira; pain doctor]  On NSAIDs [ monitor for now. OCT 2023- MRI brain- NEGATIVE for any metastatic disease.   Stable.  FEB 2024- MRI lumbar spine- degenerative disease- on ibuprofen prn- BID. Stable Left rib pain-Post throacotomy pain/tingling and numbness:  Continue gabapentin -300 mg TID; and then at extra at night prn. Stable CAD/ PVD- cramping- s/p evaluation [Dr.Schnier] dec 2023 stable COPD-stable encouraged continue to avoid smoking; again counseled to quit smoking; recommend evaluation with pulmonary- stable. Will refer back to pulmonary for PFTs in anticipation of need for clearance for gyn surgery.  Left eye cataract - in June, 10th 2024; Right July 24th- pending.  Incidental findings on Imaging  CT , 2024: Emphysema; Aortobiiliac stent endograft repair; Coronary artery disease; Aortic Atherosclerosis; I reviewed/discussed/counseled the patient.  Unfortunately continues to smoke in spite of smoking cessation  counseling.  IV Access :s/p  port placement- stable   *AM appts- Monday-    # DISPOSITION:  # keytruda today # ref to pulm # Follow-up in 3 weeks-labs/port- cbc/cmp/tsh, Dr Donneta Romberg, +/- Keytruda - la  No problem-specific Assessment & Plan notes found for this encounter.  All questions were answered. The patient knows to call the clinic with any problems, questions or concerns.    Alinda Dooms, NP 06/11/2023

## 2023-06-18 ENCOUNTER — Encounter: Payer: Self-pay | Admitting: Student in an Organized Health Care Education/Training Program

## 2023-06-18 ENCOUNTER — Ambulatory Visit: Payer: Medicare Other | Admitting: Student in an Organized Health Care Education/Training Program

## 2023-06-18 VITALS — BP 132/78 | HR 72 | Temp 98.1°F | Ht 68.0 in | Wt 205.4 lb

## 2023-06-18 DIAGNOSIS — F17210 Nicotine dependence, cigarettes, uncomplicated: Secondary | ICD-10-CM | POA: Diagnosis not present

## 2023-06-18 DIAGNOSIS — R0602 Shortness of breath: Secondary | ICD-10-CM

## 2023-06-18 DIAGNOSIS — J432 Centrilobular emphysema: Secondary | ICD-10-CM

## 2023-06-18 MED ORDER — BREZTRI AEROSPHERE 160-9-4.8 MCG/ACT IN AERO
2.0000 | INHALATION_SPRAY | Freq: Two times a day (BID) | RESPIRATORY_TRACT | 11 refills | Status: DC
Start: 2023-06-18 — End: 2024-09-02

## 2023-06-18 NOTE — Progress Notes (Signed)
Synopsis: Referred in for shortness of breath by Malva Limes, MD  Assessment & Plan:   #Shortness of breath #Centrilobular emphysema #Preserved Ratio Impaired Spirometry (PRISM)  Presenting for the evaluation of exertional dyspnea and pre-procedural risk assessment and optimization. She had PFT's in 2022 that showed a preserved ratio of 70% with impaired spirometry consistent with PRISM in light of her smoking history. Her max eosinophil count was elevated at 600. Given she was told she had asthma in the past, she could have PRISM, asthma, or a combination thereof. I will start her on triple therapy with ICS/LABA/LAMA (briztri) and assess her clinical response on follow up. Patient's imaging was reviewed and there is no sign of pembrolizumab associated lung toxicity noted.  - Budeson-Glycopyrrol-Formoterol (BREZTRI AEROSPHERE) 160-9-4.8 MCG/ACT AERO; Inhale 2 puffs into the lungs in the morning and at bedtime.  Dispense: 1284 g; Refill: 11  #Pre-procedural risk assessment  Patient will be started on triple therapy for management of COPD. Her saturation is within normal in clinic today. Proposed surgery is abdominal and if laparoscopic would be low risk for post procedural complications. 18 points on Airscat (low risk), 4 points on [place] (low risk). Would recommend post procedural CPAP should the patient be somnolent post op, and she could benefit from post op nebulizers.  #Tobacco Use Disorder  Counseled patient at length regarding smoking cessation. She is not ready to quit smoking yet.   Return in about 6 months (around 12/19/2023).  I spent 60 minutes caring for this patient today, including preparing to see the patient, obtaining a medical history , reviewing a separately obtained history, performing a medically appropriate examination and/or evaluation, counseling and educating the patient/family/caregiver, ordering medications, tests, or procedures, documenting clinical  information in the electronic health record, and independently interpreting results (not separately reported/billed) and communicating results to the patient/family/caregiver. 4 minutes counseling patient re tobacco cessation.  Raechel Chute, MD [facility] 06/18/2023 9:28 AM    End of visit medications:  Meds ordered this encounter  Medications   Budeson-Glycopyrrol-Formoterol (BREZTRI AEROSPHERE) 160-9-4.8 MCG/ACT AERO    Sig: Inhale 2 puffs into the lungs in the morning and at bedtime.    Dispense:  1284 g    Refill:  11     Current Outpatient Medications:    allopurinol (ZYLOPRIM) 100 MG tablet, Take 1 tablet (100 mg total) by mouth daily., Disp: 90 tablet, Rfl: 3   aspirin 81 MG tablet, Take 81 mg by mouth daily. , Disp: , Rfl:    bisoprolol (ZEBETA) 10 MG tablet, Take 1 tablet (10 mg total) by mouth daily., Disp: 90 tablet, Rfl: 1   Budeson-Glycopyrrol-Formoterol (BREZTRI AEROSPHERE) 160-9-4.8 MCG/ACT AERO, Inhale 2 puffs into the lungs in the morning and at bedtime., Disp: 1284 g, Rfl: 11   Cholecalciferol 25 MCG (1000 UT) capsule, Take 1,000 Units by mouth daily., Disp: , Rfl:    clopidogrel (PLAVIX) 75 MG tablet, Take 1 tablet (75 mg total) by mouth daily., Disp: 90 tablet, Rfl: 4   diazepam (VALIUM) 5 MG tablet, Take 1 tablet (5 mg total) by mouth every 12 (twelve) hours as needed., Disp: 30 tablet, Rfl: 0   fluticasone (FLONASE) 50 MCG/ACT nasal spray, Place into both nostrils daily., Disp: , Rfl:    HYDROcodone-acetaminophen (NORCO/VICODIN) 5-325 MG tablet, Take 1-2 tablets by mouth every 6 (six) hours as needed for moderate pain., Disp: 20 tablet, Rfl: 0   ibuprofen (ADVIL) 200 MG tablet, Take 400 mg by mouth 2 (  two) times daily as needed (headaches)., Disp: , Rfl:    lidocaine-prilocaine (EMLA) cream, Apply on the port. 30 -45 min  prior to port access., Disp: 30 g, Rfl: 3   lisinopril-hydrochlorothiazide (ZESTORETIC) 20-12.5 MG tablet, Take 1 tablet by  mouth daily., Disp: 90 tablet, Rfl: 1   pantoprazole (PROTONIX) 20 MG tablet, Take 1 tablet (20 mg total) by mouth daily., Disp: 90 tablet, Rfl: 1   PROAIR HFA 108 (90 Base) MCG/ACT inhaler, Inhale 2 puffs into the lungs every 6 (six) hours as needed for wheezing or shortness of breath., Disp: 8.5 g, Rfl: 4   rosuvastatin (CRESTOR) 20 MG tablet, Take 1 tablet (20 mg total) by mouth daily., Disp: 90 tablet, Rfl: 1   Semaglutide, 1 MG/DOSE, (OZEMPIC, 1 MG/DOSE,) 4 MG/3ML SOPN, Inject 1 mg into the skin once a week., Disp: 9 mL, Rfl: 1 No current facility-administered medications for this visit.  Facility-Administered Medications Ordered in Other Visits:    heparin lock flush 100 UNIT/ML injection, , , ,    Subjective:   PATIENT ID: Laura Nielsen GENDER: female DOB: May 28, 1956, MRN: [id number]  Chief Complaint  Patient presents with   pulmonary consult    Hx of COPD- prod cough with clear sputum and SOB with exertion.     HPI  Patient is a pleasant 67 year old female presenting to clinic for the evaluation of shortness of breath.  She reports shortness of breath with exertion as well as a chronic cough that is nonproductive. The symptoms have been persistent, but worsened as of recent, especially with exertion. She does not have any chest pain, chest tightness, or wheezing. There is no hemoptysis, no sputum production, no fevers, no chills, and no night sweats. Weight has been stable and there are no other signs of symptoms of systemic illness.  She was told in the past that she had COPD and was prescribed an albuterol inhaler that she rarely uses. She has a history of lung cancer diagnosed in May of 2022 s/p lobectomy with recurrence in June of 2023. She was found to have left adrenal mets and confirmed to be adenocarcinoma by biopsy. She was started on pembrolizumab which she has tolerated. Furthermore, patient is known to have a right complex cystic adnexal mass with interval enlargement, now  measuring 11.7 x 8.2 cm awaiting Gyn-Onc evaluation. Pulmonology is asked to weigh in on surgical risk and pre-procedural optimization.  She is a current smoker, smoking between 1/2 pack and 1 pack daily. She denies any occupational exposures and is currently retired. She has two cats at home.  Ancillary information including prior medications, full medical/surgical/family/social histories, and PFTs (when available) are listed below and have been reviewed.   Review of Systems  Constitutional:  Negative for chills, fever, malaise/fatigue and weight loss.  Respiratory:  Positive for cough, sputum production and shortness of breath. Negative for hemoptysis and wheezing.   Cardiovascular:  Negative for chest pain.     Objective:   Vitals:   06/18/23 0904  BP: 132/78  Pulse: 72  Temp: 98.1 F (36.7 C)  TempSrc: Temporal  SpO2: 98%  Weight: 205 lb 6.4 oz (93.2 kg)  Height: 5\' 8"  (1.727 m)   98% on RA BMI Readings from Last 3 Encounters:  06/18/23 31.23 kg/m  06/11/23 31.02 kg/m  05/21/23 31.35 kg/m   Wt Readings from Last 3 Encounters:  06/18/23 205 lb 6.4 oz (93.2 kg)  06/11/23 204 lb (92.5 kg)  05/21/23 206 lb 3.2 oz (93.5 kg)  Physical Exam Constitutional:      Appearance: Normal appearance. She is obese.  HENT:     Mouth/Throat:     Mouth: Mucous membranes are moist.  Cardiovascular:     Rate and Rhythm: Normal rate and regular rhythm.     Pulses: Normal pulses.     Heart sounds: Normal heart sounds.  Pulmonary:     Effort: No respiratory distress.     Breath sounds: Normal breath sounds. No wheezing or rales.  Abdominal:     General: There is distension.     Palpations: Abdomen is soft.  Neurological:     General: No focal deficit present.     Mental Status: She is alert and oriented to person, place, and time. Mental status is at baseline.       Ancillary Information    Past Medical History:  Diagnosis Date   AAA (abdominal aortic aneurysm)  (HCC)    s/p EVAR AAA 03/27/13   Arthritis    Asthma    Cancer (HCC)    Complication of anesthesia    BP drops after   Coronary artery disease    History of kidney stones    Osteoporosis    Sleep apnea      Family History  Problem Relation Age of Onset   Hypertension Mother    Coronary artery disease Mother    Heart attack Mother        acute   Cancer Mother    Alcohol abuse Father    Depression Father    Hypertension Father    Heart attack Father 3       acute   Alcohol abuse Sister    Hyperlipidemia Sister    Hypertension Sister    Cancer Sister 12   Heart attack Sister        x's 2   Coronary artery disease Sister 83       x's 2   Breast cancer Sister 2     Past Surgical History:  Procedure Laterality Date   ABDOMINAL AORTIC ENDOVASCULAR STENT GRAFT  03/27/2013   Dr. Levora Dredge   Cardiac catheterization  02/2009   70-80% stenosis RCA stent placed. started on Plavix   CARDIAC CATHETERIZATION     CATARACT EXTRACTION W/PHACO Left 05/14/2023   Procedure: CATARACT EXTRACTION PHACO AND INTRAOCULAR LENS PLACEMENT (IOC) LEFT DIABETIC  OMIDRIA  19.40  01:34.8;  Surgeon: Estanislado Pandy, MD;  Location: [facility];  Service: Ophthalmology;  Laterality: Left;   COLONOSCOPY WITH PROPOFOL N/A 01/05/2021   Procedure: COLONOSCOPY WITH PROPOFOL;  Surgeon: Pasty Spillers, MD;  Location: [facility];  Service: Endoscopy;  Laterality: N/A;   CORONARY ANGIOPLASTY     stent placed   ESOPHAGOGASTRODUODENOSCOPY (EGD) WITH PROPOFOL N/A 01/05/2021   Procedure: ESOPHAGOGASTRODUODENOSCOPY (EGD) WITH PROPOFOL;  Surgeon: Pasty Spillers, MD;  Location: [facility];  Service: Endoscopy;  Laterality: N/A;   INTERCOSTAL NERVE BLOCK Left 04/06/2021   Procedure: INTERCOSTAL NERVE BLOCK;  Surgeon: Loreli Slot, MD;  [facility];  Service: Thoracic;  Laterality: Left;   IR IMAGING GUIDED PORT INSERTION  06/20/2022   KIDNEY STONE SURGERY  1999    LUNG LOBECTOMY Left 04/06/2021   robotic left lower lobectomy 04/06/2021 Dr. Dorris Fetch for Stage 1A adenocarcinoma   NODE DISSECTION Left 04/06/2021   Procedure: NODE DISSECTION;  Surgeon: Loreli Slot, MD;  Location: [facility] OR;  Service: Thoracic;  Laterality: Left;   TUBAL LIGATION     VAGINAL HYSTERECTOMY  Menometrorrhagia. Excessive bleeding. Unknown if cervix removed.     Social History   Socioeconomic History   Marital status: Divorced    Spouse name: Not on file   Number of children: 2   Years of education: H/S   Highest education level: High school graduate  Occupational History   Occupation: Disabled   Occupation: retired  Tobacco Use   Smoking status: Every Day    Current packs/day: 1.00    Average packs/day: 0.7 packs/day for 96.0 years (62.7 ttl pk-yrs)    Types: Cigarettes    Start date: 1973   Smokeless tobacco: Never   Tobacco comments:    12/23/19 states she quit for 9 months and then started back.   Vaping Use   Vaping status: Never Used  Substance and Sexual Activity   Alcohol use: Yes    Comment: rarely - once a year   Drug use: No   Sexual activity: Not on file  Other Topics Concern   Not on file  Social History Narrative   Hx of smoking; quit prior to lung surgery then started back. Lives in [place] alone. Used to work in [employer], [employer]- Facilities manager. On disability sec to spinal pain.    Social Determinants of Health   Financial Resource Strain: Medium Risk (02/16/2023)   Overall Financial Resource Strain (CARDIA)    Difficulty of Paying Living Expenses: Somewhat hard  Food Insecurity: Patient Declined (02/16/2023)   Hunger Vital Sign    Worried About Running Out of Food in the Last Year: Patient declined    Ran Out of Food in the Last Year: Patient declined  Transportation Needs: No Transportation Needs (02/16/2023)   PRAPARE - Administrator, Civil Service (Medical): No    Lack of Transportation (Non-Medical): No  Physical  Activity: Unknown (02/16/2023)   Exercise Vital Sign    Days of Exercise per Week: 0 days    Minutes of Exercise per Session: Patient declined  Stress: Stress Concern Present (02/16/2023)   [employer] of Occupational Health - Occupational Stress Questionnaire    Feeling of Stress : Rather much  Social Connections: Unknown (02/16/2023)   Social Connection and Isolation Panel [NHANES]    Frequency of Communication with Friends and Family: More than three times a week    Frequency of Social Gatherings with Friends and Family: Once a week    Attends Religious Services: Not on file    Active Member of Clubs or Organizations: No    Attends Banker Meetings: Never    Marital Status: Patient declined  Intimate Partner Violence: Not At Risk (02/20/2023)   Humiliation, Afraid, Rape, and Kick questionnaire    Fear of Current or Ex-Partner: No    Emotionally Abused: No    Physically Abused: No    Sexually Abused: No     Allergies  Allergen Reactions   Atorvastatin Other (See Comments)    Elevated blood sugar    Omeprazole Nausea And Vomiting   Bupropion Nausea Only    Only on the 150mg  tablets, but the 100mg  didn't help with smoking     CBC    Component Value Date/Time   WBC 8.7 06/11/2023 0806   RBC 5.30 (H) 06/11/2023 0806   HGB 15.0 06/11/2023 0806   HGB 14.2 03/05/2023 0947   HGB 15.9 02/13/2022 1027   HCT 44.6 06/11/2023 0806   HCT 47.7 (H) 02/13/2022 1027   PLT 201 06/11/2023 0806   PLT 264 03/05/2023 0947  PLT 237 02/13/2022 1027   MCV 84.2 06/11/2023 0806   MCV 84 02/13/2022 1027   MCV 88 03/28/2013 0354   MCH 28.3 06/11/2023 0806   MCHC 33.6 06/11/2023 0806   RDW 14.1 06/11/2023 0806   RDW 14.0 02/13/2022 1027   RDW 13.9 03/28/2013 0354   LYMPHSABS 3.1 06/11/2023 0806   LYMPHSABS 4.3 (H) 02/16/2016 0941   LYMPHSABS 2.0 03/28/2013 0354   MONOABS 0.6 06/11/2023 0806   MONOABS 0.7 03/28/2013 0354   EOSABS 0.5 06/11/2023 0806   EOSABS 0.3  02/16/2016 0941   EOSABS 0.2 03/28/2013 0354   BASOSABS 0.1 06/11/2023 0806   BASOSABS 0.0 02/16/2016 0941   BASOSABS 0.0 03/28/2013 0354    Pulmonary Functions Testing Results:     No data to display          Outpatient Medications Prior to Visit  Medication Sig Dispense Refill   allopurinol (ZYLOPRIM) 100 MG tablet Take 1 tablet (100 mg total) by mouth daily. 90 tablet 3   aspirin 81 MG tablet Take 81 mg by mouth daily.      bisoprolol (ZEBETA) 10 MG tablet Take 1 tablet (10 mg total) by mouth daily. 90 tablet 1   Cholecalciferol 25 MCG (1000 UT) capsule Take 1,000 Units by mouth daily.     clopidogrel (PLAVIX) 75 MG tablet Take 1 tablet (75 mg total) by mouth daily. 90 tablet 4   diazepam (VALIUM) 5 MG tablet Take 1 tablet (5 mg total) by mouth every 12 (twelve) hours as needed. 30 tablet 0   fluticasone (FLONASE) 50 MCG/ACT nasal spray Place into both nostrils daily.     HYDROcodone-acetaminophen (NORCO/VICODIN) 5-325 MG tablet Take 1-2 tablets by mouth every 6 (six) hours as needed for moderate pain. 20 tablet 0   ibuprofen (ADVIL) 200 MG tablet Take 400 mg by mouth 2 (two) times daily as needed (headaches).     lidocaine-prilocaine (EMLA) cream Apply on the port. 30 -45 min  prior to port access. 30 g 3   lisinopril-hydrochlorothiazide (ZESTORETIC) 20-12.5 MG tablet Take 1 tablet by mouth daily. 90 tablet 1   pantoprazole (PROTONIX) 20 MG tablet Take 1 tablet (20 mg total) by mouth daily. 90 tablet 1   PROAIR HFA 108 (90 Base) MCG/ACT inhaler Inhale 2 puffs into the lungs every 6 (six) hours as needed for wheezing or shortness of breath. 8.5 g 4   rosuvastatin (CRESTOR) 20 MG tablet Take 1 tablet (20 mg total) by mouth daily. 90 tablet 1   Semaglutide, 1 MG/DOSE, (OZEMPIC, 1 MG/DOSE,) 4 MG/3ML SOPN Inject 1 mg into the skin once a week. 9 mL 1   Facility-Administered Medications Prior to Visit  Medication Dose Route Frequency Provider Last Rate Last Admin   heparin lock flush  100 UNIT/ML injection

## 2023-06-19 ENCOUNTER — Encounter: Payer: Self-pay | Admitting: Ophthalmology

## 2023-06-19 NOTE — Anesthesia Preprocedure Evaluation (Addendum)
Anesthesia Evaluation  Patient identified by MRN, date of birth, ID band Patient awake  General Assessment Comment:Chemo, port right chest. No new medical issues since last cataract last month.  Reviewed: Allergy & Precautions, H&P , NPO status , Patient's Chart, lab work & pertinent test results  History of Anesthesia Complications Negative for: history of anesthetic complications  Airway Mallampati: II  TM Distance: >3 FB Neck ROM: Full    Dental  (+) Poor Dentition, Loose, Chipped Some missing, loose left upper tooth and loose left central incisor, needs dental work, doesn't get numb from local anesthesia, says she was told she is "too high risk to be put to sleep," but has not been told to get cardiac clearance/letter of risk, nor pulmonary letter of risk: :   Pulmonary asthma , sleep apnea , COPD, Current Smoker and Patient abstained from smoking.   Pulmonary exam normal breath sounds clear to auscultation       Cardiovascular Exercise Tolerance: Poor METShypertension, + CAD, + Cardiac Stents and + Peripheral Vascular Disease  (-) Past MI Normal cardiovascular exam(-) dysrhythmias  Rhythm:Regular Rate:Normal  Xience DES mid RCA 03/16/2009 2. Endovascular repair of abdominal aortic aneurysm 3. Essential hypertension 4. Hyperlipidemia 5. COPD / ongoing tobacco abuse 6. Recurrent stage IV left lower lobe lung cancer on Keytruda   Patient previously followed by Dr. Lady Gary. She is status post Xience DES mid RCA 03/16/2009. She also is status post endovascular repair of abdominal aortic aneurysm followed by Dr. Lorretta Harp.   She returns today, reports "doing okay". She denies chest pain. She has chronic exertional dyspnea due to underlying COPD and ongoing tobacco abuse. The patient continues smoke less than a pack of cigarettes per day. Oxygen saturation 96% on room air. She denies palpitations or heart racing. She denies peripheral edema.  The patient is active but does not exercise regularly.    ECG/11/2021 revealed normal sinus rhythm at 76 bpm.    2D echocardiogram 10/22/2017 revealed LVEF greater than 55% with mild tricuspid regurgitation.    Neuro/Psych  Headaches PSYCHIATRIC DISORDERS  Depression     Neuromuscular disease    GI/Hepatic Neg liver ROS,GERD  Controlled,,Severe GERD, partly controlled w/pantropazole, didn't take pantoprazole today   Endo/Other  diabetes  Obesity On GLP1, last taken 11 days ago. Denies GI symptoms today  Renal/GU negative Renal ROS  negative genitourinary   Musculoskeletal  (+) Arthritis ,    Abdominal  (+) + obese  Peds negative pediatric ROS (+)  Hematology negative hematology ROS (+)   Anesthesia Other Findings Previous cataract surgery 05-14-23  AAA (abdominal aortic aneurysm) (HCC)               s/p EVAR AAA 03/27/13 Arthritis                                     Asthma                                     Cancer (HCC)                          Complication of anesthesia--BP drops Coronary artery disease                         History of kidney  stones                                  Osteoporosis                                     Sleep apnea   Reproductive/Obstetrics negative OB ROS                             Anesthesia Physical Anesthesia Plan  ASA: 3  Anesthesia Plan: MAC   Post-op Pain Management: Minimal or no pain anticipated   Induction: Intravenous  PONV Risk Score and Plan: 1 and Midazolam  Airway Management Planned: Natural Airway and Nasal Cannula  Additional Equipment:   Intra-op Plan:   Post-operative Plan:   Informed Consent: I have reviewed the patients History and Physical, chart, labs and discussed the procedure including the risks, benefits and alternatives for the proposed anesthesia with the patient or authorized representative who has indicated his/her understanding and acceptance.     Dental  Advisory Given  Plan Discussed with: Anesthesiologist, CRNA and Surgeon  Anesthesia Plan Comments: (Patient consented for risks of anesthesia including but not limited to:  - adverse reactions to medications - damage to eyes, teeth, lips or other oral mucosa - nerve damage due to positioning  - sore throat or hoarseness - Damage to heart, brain, nerves, lungs, other parts of body or loss of life  Patient voiced understanding. Very concerned that local anesthesia will be ineffective, but patient states it is "sometimes" effective, and wishes to try today. IF not effective, will need cardiac letter of risk prior to intubation and GETA. )        Anesthesia Quick Evaluation

## 2023-06-20 ENCOUNTER — Inpatient Hospital Stay (HOSPITAL_BASED_OUTPATIENT_CLINIC_OR_DEPARTMENT_OTHER): Payer: Medicare Other | Admitting: Obstetrics and Gynecology

## 2023-06-20 ENCOUNTER — Encounter: Payer: Self-pay | Admitting: Obstetrics and Gynecology

## 2023-06-20 ENCOUNTER — Inpatient Hospital Stay: Payer: Medicare Other

## 2023-06-20 ENCOUNTER — Other Ambulatory Visit: Payer: Self-pay | Admitting: Student in an Organized Health Care Education/Training Program

## 2023-06-20 VITALS — BP 127/78 | HR 70 | Temp 97.6°F | Resp 19 | Wt 204.4 lb

## 2023-06-20 DIAGNOSIS — Z5112 Encounter for antineoplastic immunotherapy: Secondary | ICD-10-CM | POA: Diagnosis not present

## 2023-06-20 DIAGNOSIS — R1900 Intra-abdominal and pelvic swelling, mass and lump, unspecified site: Secondary | ICD-10-CM

## 2023-06-20 DIAGNOSIS — C7972 Secondary malignant neoplasm of left adrenal gland: Secondary | ICD-10-CM | POA: Diagnosis not present

## 2023-06-20 DIAGNOSIS — Z7189 Other specified counseling: Secondary | ICD-10-CM

## 2023-06-20 DIAGNOSIS — J432 Centrilobular emphysema: Secondary | ICD-10-CM

## 2023-06-20 DIAGNOSIS — F17210 Nicotine dependence, cigarettes, uncomplicated: Secondary | ICD-10-CM | POA: Diagnosis not present

## 2023-06-20 DIAGNOSIS — C3432 Malignant neoplasm of lower lobe, left bronchus or lung: Secondary | ICD-10-CM | POA: Diagnosis not present

## 2023-06-20 DIAGNOSIS — Z90710 Acquired absence of both cervix and uterus: Secondary | ICD-10-CM | POA: Diagnosis not present

## 2023-06-20 NOTE — Progress Notes (Signed)
Gynecologic Oncology Interval Visit   Referring Provider: Dr. Tiburcio Pea  Chief Complaint: Incidental Right Sided Complex Cystic Mass Subjective:  Laura Nielsen is a 67 y.o. G2P2 female s/p hysterectomy for bleeding over 30 years ago (bilateral ovaries in situ) who is seen in consultation from Dr. Sherrie Mustache for incidental right sided 6 cm complex cystic mass. She was seen by Dr. Johnnette Litter in October 2021 and felt to be low risk of ovarian malignancy.   Since she was last seen she was diagnosed with lung cancer and on imaging had a increase in the size of the right cystic mass.  She is asymptomatic.  With regard to her lung cancer she was diagnosed in May 2022 with LLL nodule 2.3 cm Adenocarcinoma Stage IA; s/p lobectomy. No adjuvant therapy   May 07 2022-  CT scan highly concerning for recurrent/metastatic disease with-. New left adrenal metastasis;  Ground-glass and part solid nodules in the peripheral right lower lobe, unchanged from 11/07/2021 but slightly enlarged from 01/01/2018. Findings are suspicious for indolent adenocarcinoma.  F One- PFL1 =80%; KRAS G12C PET scan JUNE 20th- 2023-  Enlarged 4 cm hypermetabolic LEFT adrenal metastasis;  Suspicion of metastatic adenopathy to the LEFT hilum.  Part solid nodule in the RIGHT lower lobe without metabolic activity.    ADRENAL BIOPSY- CK7 POSITIVE ADENOCARCINOMA- There is limited tissue remaining for ancillary testing, not likely  sufficient for NGS testing.    Jul 10 2022 - single agent Keytruda with excellent response  May 17 2023  CT CHEST, ABDOMEN, AND PELVIS   Lungs/Pleura: Status post left lower lobectomy. Diffuse bilateral bronchial wall thickening. Mild centrilobular and paraseptal emphysema. Unchanged small ground-glass nodules in the right lower lobe, including a 1.0 x 0.8 cm nodule in the dependent right lower lobe (series 4, image 100). Background of very fine centrilobular nodularity, most concentrated in the lung apices. No  pleural effusion or pneumothorax.   No enlarged abdominal or pelvic lymph nodes.   Reproductive: Significant interval enlargement of a multi septated cystic lesion of the right ovary, now measuring 11.7 x 8.2 cm, previously 8.1 x 5.8 cm (series 2, image 103). Normal left ovary. Status post hysterectomy.    IMPRESSION: 1. Status post left lower lobectomy. No evidence of recurrent or metastatic disease in the chest. 2. Unchanged small ground-glass nodules in the right lower lobe, including a 1.0 x 0.8 cm nodule in the dependent right lower lobe. These remain nonspecific, possibly infectious or inflammatory although small metachronous adenocarcinoma not excluded. Attention on follow-up. 3. Left adrenal metastasis remains resolved following treatment.  4. Significant interval enlargement of a multiseptated cystic lesion of the right ovary, now measuring 11.7 x 8.2 cm, previously 8.1 x 5.8 cm. 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 5. Emphysema and diffuse bilateral bronchial wall thickening. Background of very fine centrilobular nodularity, consistent with smoking-related respiratory bronchiolitis. 6. Aortobiiliac stent endograft repair. 7. Coronary artery disease.  She has a cardiologist and a pulmonologist.  She does not remember the last time she had a stress test.  She requires a dobutamine stress test due to her limited activities.   She saw Dr. Aundria Rud for preoperative assessment. She had PFT's in 2022 that showed a preserved ratio of 70% with impaired spirometry consistent with PRISM in light of her smoking history. Her max eosinophil count was elevated at 600. He started her on triple therapy with ICS/LABA/LAMA (briztri) and assess her clinical response on follow up. Patient's imaging was reviewed and there is no sign of pembrolizumab associated  lung toxicity noted. At the time of her surgery the total amount of lung resected was 18 cm. She continues to smoke. Counseled patient  at length regarding smoking cessation. She is not ready to quit smoking yet. 18 points on Airscat (low risk), 4 points on Arozullah (low risk). He recommended post procedural CPAP should the patient be somnolent post op, and she could benefit from post op nebulizers.  Gynecologic History:  Laura Nielsen is a pleasant female s/p hysterectomy for bleeding over 30 years ago (bilateral ovaries in situ) who is seen in consultation incidental right sided 6 cm complex cystic mass.   She underwent Low dose CT/Lung Cancer Screening in 2021 which showed volume derived mean 9.8 mm nodule in the left lower lobe, increased in size. 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  No symptoms.   In view of incidental finding of ovarian mass and [place] was done Ultrasound: Right ovary: 6.9 x 5.2 x 5.6 normal appearing. Complex mass with anechoic fluid and thick septations in the right ovary. No blood flow seen. Measures 6.33 x 5.16 x 4.62 cm. Left ovary not visualized. No free fluid. Uterus and cervix surgically removed.   CA 125 - 8.9 HE4- 94.6 Post menopausal ROMA 1.47 = 14% (normal)  Patient felt to be high risk for surgery in view of pulmonary issues and BMI and Dr. Tiburcio Pea referred her to gynecologic oncology for evaluation.   Surveillance recommended in view of benign characteristics of mass.    She was released to follow up with Dr. Tiburcio Pea and last saw him in July 2022. Repeat ultrasound and ca 125 was recommended at that time.   01/25/22  US pelvis for surveillance FINDINGS:  - UTERUS/CERVIX: Status post hysterectomy.  - OVARIES: The ovaries were seen well transvaginally. 5.3 x 4.2 x 5.1 cm cystic lesion with multiple mildly thickened septations noted in the right ovary, previously measured 4.8 x 4.0 x 5.3 cm. No mural  nodularity, or papillary projections appreciated. Mild peripheral vascularity. Appropriate arterial inflow and venous outflow of the ovaries was documented on color and spectral Doppler imaging. The right ovary measured 5.8 x 5.6 x 5.2 cm. - OTHER: No abnormal pelvic free fluid. 5.3 cm cystic lesion with thickened septations, as above, minimally increased in size compared with 05/25/2021.  O-RADS ultrasound category: 3: Low Risk (1  - <10%).   Continued surveillance without concerning symptoms.  Her past gynecologic history is also significant for history of cryotherapy for cervical dysplasia.     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  08/31/2020   Fatty liver 08/09/2020   Recurrent major depressive disorder, in partial remission (HCC) 04/20/2020   Aortic atherosclerosis (HCC) 07/17/2019   Abnormal MRI, lumbar spine (03/28/2017) 07/04/2017   Abnormal MRI, cervical spine (03/28/2017) 07/04/2017   Lumbar facet  syndrome (Bilateral) (L>R) 04/26/2017   Lumbar facet hypertrophy (multilevel) (Bilateral) 04/26/2017   Neurogenic pain 04/26/2017   Musculoskeletal pain 04/26/2017   Chronic pain syndrome 03/08/2017   Chronic low back pain (1ry area of Pain) (Bilateral) (L>R) w/o sciatica 03/08/2017   Chronic neck pain (2ry area of Pain) (Bilateral) (R>L) 03/08/2017   Cervicogenic headache (Right) 03/08/2017   Chronic upper back pain (3ry area of Pain) (Bilateral) (R>L)  03/08/2017   Acute right hip pain 03/08/2017   Osteoarthritis of hip (Bilateral) (L>R) 03/08/2017   Cervical central spinal stenosis 03/08/2017   Cervical spondylosis with radiculopathy (Right) (C5) 03/08/2017   Lumbar spondylosis 03/08/2017   Atherosclerosis of native arteries of extremity with intermittent claudication (HCC) 02/22/2017   Gout 02/16/2016   Arthritis 05/14/2015   Carotid artery narrowing 05/14/2015   Claudication (HCC) 05/14/2015   CAFL (chronic airflow limitation) (HCC) 05/14/2015   Clinical depression 05/14/2015   Emphysema lung (HCC) 05/14/2015   Acid reflux 05/14/2015   Urinary incontinence 05/14/2015   Pins and needles sensation 05/14/2015   Obstructive apnea 05/14/2015   History of abnormal cervical Papanicolaou smear 05/14/2015   Peripheral blood vessel disorder (HCC) 05/14/2015   B12 deficiency 05/14/2015   Vitamin D insufficiency 05/14/2015   S/P AAA repair 05/26/2014   Aortic heart valve narrowing 01/31/2013   Malaise and fatigue 09/26/2012   CAD in native artery 03/17/2009   Diabetes mellitus, type 2 (HCC) 02/05/2009   Hypercholesteremia 06/24/2008   Allergic rhinitis 10/25/2007   Airway hyperreactivity 10/25/2007   Smoking greater than 30 pack years 10/25/2007   Narrowing of intervertebral disc space 10/25/2007   Primary hypertension 10/25/2007   Arthritis, degenerative 10/25/2007    Past Medical History: Past Medical History:  Diagnosis Date   AAA (abdominal aortic aneurysm) (HCC)    s/p EVAR AAA 03/27/13   Arthritis    Asthma    Cancer (HCC)    Centrilobular emphysema (HCC)    Complication of anesthesia    BP drops after   Coronary artery disease    History of kidney stones    Osteoporosis    Primary cancer of left lower lobe of lung (HCC)    Sleep apnea     Past Surgical History: Past Surgical History:  Procedure Laterality Date   ABDOMINAL AORTIC ENDOVASCULAR STENT GRAFT  03/27/2013   Dr. Levora Dredge   Cardiac  catheterization  02/2009   70-80% stenosis RCA stent placed. started on Plavix   CARDIAC CATHETERIZATION     CATARACT EXTRACTION W/PHACO Left 05/14/2023   Procedure: CATARACT EXTRACTION PHACO AND INTRAOCULAR LENS PLACEMENT (IOC) LEFT DIABETIC  OMIDRIA  19.40  01:34.8;  Surgeon: Estanislado Pandy, MD;  [facility]: [facility];  Service: Ophthalmology;  Laterality: Left;   COLONOSCOPY WITH PROPOFOL N/A 01/05/2021   Procedure: COLONOSCOPY WITH PROPOFOL;  Surgeon: Pasty Spillers, MD;  Location: [facility] END[facility];  Service: Endoscopy;  Laterality: N/A;   CORONARY ANGIOPLASTY     stent placed   ESOPHAGOGASTRODUODENOSCOPY (EGD) WITH PROPOFOL N/A 01/05/2021   Procedure: ESOPHAGOGASTRODUODENOSCOPY (EGD) WITH PROPOFOL;  Surgeon: Pasty Spillers, MD;  Location: [facility] END[facility];  Service: Endoscopy;  Laterality: N/A;   INTERCOSTAL NERVE BLOCK Left 04/06/2021   Procedure: INTERCOSTAL NERVE BLOCK;  Surgeon: Loreli Slot, MD;  Location: [facility] OR;  Service: Thoracic;  Laterality: Left;   IR IMAGING GUIDED PORT INSERTION  06/20/2022   KIDNEY STONE SURGERY  1999   LUNG LOBECTOMY Left 04/06/2021   robotic left lower lobectomy 04/06/2021 Dr. Dorris Fetch  for Stage 1A adenocarcinoma   NODE DISSECTION Left 04/06/2021   Procedure: NODE DISSECTION;  Surgeon: Loreli Slot, MD;  Location: [facility];  Service: Thoracic;  Laterality: Left;   TUBAL LIGATION     VAGINAL HYSTERECTOMY     Menometrorrhagia. Excessive bleeding. Unknown if cervix removed.      OB History:  OB History  Gravida Para Term Preterm AB Living  2 2          SAB IAB Ectopic Multiple Live Births               # Outcome Date GA Lbr Len/2nd Weight Sex Type Anes PTL Lv  2 Para           1 Para             Family History: Family History  Problem Relation Age of Onset   Hypertension Mother    Coronary artery disease Mother    Heart attack Mother        acute   Cancer Mother    Alcohol abuse Father    Depression  Father    Hypertension Father    Heart attack Father 110       acute   Alcohol abuse Sister    Hyperlipidemia Sister    Hypertension Sister    Cancer Sister 34   Heart attack Sister        x's 2   Coronary artery disease Sister 72       x's 2   Breast cancer Sister 29    Social History: Social History   Socioeconomic History   Marital status: Divorced    Spouse name: Not on file   Number of children: 2   Years of education: H/S   Highest education level: High school graduate  Occupational History   Occupation: Disabled   Occupation: retired  Tobacco Use   Smoking status: Every Day    Current packs/day: 1.00    Average packs/day: 0.7 packs/day for 96.0 years (62.7 ttl pk-yrs)    Types: Cigarettes    Start date: 1973   Smokeless tobacco: Never   Tobacco comments:    12/23/19 states she quit for 9 months and then started back.   Vaping Use   Vaping status: Never Used  Substance and Sexual Activity   Alcohol use: Yes    Comment: rarely - once a year   Drug use: No   Sexual activity: Not on file  Other Topics Concern   Not on file  Social History Narrative   Hx of smoking; quit prior to lung surgery then started back. Lives in [place] alone. Used to work in [employer], [employer]- Facilities manager. On disability sec to spinal pain.    Social Determinants of Health   Financial Resource Strain: Medium Risk (02/16/2023)   Overall Financial Resource Strain (CARDIA)    Difficulty of Paying Living Expenses: Somewhat hard  Food Insecurity: Patient Declined (02/16/2023)   Hunger Vital Sign    Worried About Running Out of Food in the Last Year: Patient declined    Ran Out of Food in the Last Year: Patient declined  Transportation Needs: No Transportation Needs (02/16/2023)   PRAPARE - Administrator, Civil Service (Medical): No    Lack of Transportation (Non-Medical): No  Physical Activity: Unknown (02/16/2023)   Exercise Vital Sign    Days of Exercise per Week: 0 days     Minutes of Exercise per Session: Patient declined  Stress: Stress  Concern Present (02/16/2023)   [employer] of Occupational Health - Occupational Stress Questionnaire    Feeling of Stress : Rather much  Social Connections: Unknown (02/16/2023)   Social Connection and Isolation Panel [NHANES]    Frequency of Communication with Friends and Family: More than three times a week    Frequency of Social Gatherings with Friends and Family: Once a week    Attends Religious Services: Not on Insurance claims handler of Clubs or Organizations: No    Attends Banker Meetings: Never    Marital Status: Patient declined  Intimate Partner Violence: Not At Risk (02/20/2023)   Humiliation, Afraid, Rape, and Kick questionnaire    Fear of Current or Ex-Partner: No    Emotionally Abused: No    Physically Abused: No    Sexually Abused: No    Allergies: Allergies  Allergen Reactions   Atorvastatin Other (See Comments)    Elevated blood sugar    Omeprazole Nausea And Vomiting   Bupropion Nausea Only    Only on the 150mg  tablets, but the 100mg  didn't help with smoking    Current Medications: Current Outpatient Medications  Medication Sig Dispense Refill   allopurinol (ZYLOPRIM) 100 MG tablet Take 1 tablet (100 mg total) by mouth daily. 90 tablet 3   aspirin 81 MG tablet Take 81 mg by mouth daily.      bisoprolol (ZEBETA) 10 MG tablet Take 1 tablet (10 mg total) by mouth daily. 90 tablet 1   Budeson-Glycopyrrol-Formoterol (BREZTRI AEROSPHERE) 160-9-4.8 MCG/ACT AERO Inhale 2 puffs into the lungs in the morning and at bedtime. 1284 g 11   Cholecalciferol 25 MCG (1000 UT) capsule Take 1,000 Units by mouth daily.     clopidogrel (PLAVIX) 75 MG tablet Take 1 tablet (75 mg total) by mouth daily. 90 tablet 4   diazepam (VALIUM) 5 MG tablet Take 1 tablet (5 mg total) by mouth every 12 (twelve) hours as needed. 30 tablet 0   HYDROcodone-acetaminophen (NORCO/VICODIN) 5-325 MG tablet Take 1-2  tablets by mouth every 6 (six) hours as needed for moderate pain. 20 tablet 0   ibuprofen (ADVIL) 200 MG tablet Take 400 mg by mouth 2 (two) times daily as needed (headaches).     lidocaine-prilocaine (EMLA) cream Apply on the port. 30 -45 min  prior to port access. 30 g 3   lisinopril-hydrochlorothiazide (ZESTORETIC) 20-12.5 MG tablet Take 1 tablet by mouth daily. 90 tablet 1   pantoprazole (PROTONIX) 20 MG tablet Take 1 tablet (20 mg total) by mouth daily. 90 tablet 1   PROAIR HFA 108 (90 Base) MCG/ACT inhaler Inhale 2 puffs into the lungs every 6 (six) hours as needed for wheezing or shortness of breath. 8.5 g 4   rosuvastatin (CRESTOR) 20 MG tablet Take 1 tablet (20 mg total) by mouth daily. 90 tablet 1   Semaglutide, 1 MG/DOSE, (OZEMPIC, 1 MG/DOSE,) 4 MG/3ML SOPN Inject 1 mg into the skin once a week. 9 mL 1   fluticasone (FLONASE) 50 MCG/ACT nasal spray Place into both nostrils daily. (Patient not taking: Reported on 06/19/2023)     No current facility-administered medications for this visit.   Facility-Administered Medications Ordered in Other Visits  Medication Dose Route Frequency Provider Last Rate Last Admin   heparin lock flush 100 UNIT/ML injection            Review of Systems General:  fatigue Skin: no complaints Eyes: no complaints HEENT: allergies Breasts: no complaints Pulmonary: no significant shortness  of breath however very limited activity.  She is unable to walk for long distances or climb stairs due to back pain Cardiac: no complaints.  Limitations as noted above. Gastrointestinal: abdominal pain, distention, dec appetite, constipation Genitourinary/Sexual: no complaints Ob/Gyn: no complaints Musculoskeletal: back pain Hematology: no complaints Neurologic/Psych: depression  Objective:  Physical Examination:  BP 127/78   Pulse 70   Temp 97.6 F (36.4 C)   Resp 19   Wt 204 lb 6.4 oz (92.7 kg)   SpO2 100%   BMI 31.08 kg/m     ECOG Performance Status: 2 -  Symptomatic, <50% confined to bed  GENERAL: Patient is a elderly appearing female in no acute distress. She is able to ambulate independently but has an altered gait HEENT:  PERRL, neck supple with midline trachea.  LUNGS: Normal respiratory effort ABDOMEN:  Soft, nontender, nondistended.  No ascites.  Possible pelvic mass in the right lower quadrant however this would not have been appreciated without seeing the prior images. EXTREMITIES:  No peripheral edema.   NEURO:  Nonfocal. Well oriented.  Appropriate affect.  Pelvic: Chaperoned by RN EGBUS: no lesions Cervix: Surgically absent Vagina: no lesions, no discharge or bleeding Uterus: Surgically absent Adnexa: Positive for smooth mass of the vaginal cuff that is slightly tender to deep palpation.  Exam is limited by habitus.   Lab Review CA125 and HE4 pending  Radiologic Imaging: 05/17/2023  CT CHEST, ABDOMEN, AND PELVIS WITH CONTRAST   TECHNIQUE: Multidetector CT imaging of the chest, abdomen and pelvis was performed following the standard protocol during bolus administration of intravenous contrast.   RADIATION DOSE REDUCTION: This exam was performed according to the departmental dose-optimization program which includes automated exposure control, adjustment of the mA and/or kV according to patient size and/or use of iterative reconstruction technique.   CONTRAST:  OMNIPAQUE IOHEXOL 300 MG/ML  SOLN   COMPARISON:  CT chest angiogram, 01/14/2023, CT chest abdomen pelvis, 12/20/2022   FINDINGS: CT CHEST FINDINGS   Cardiovascular: Right chest port catheter. Aortic atherosclerosis. Normal heart size. Three-vessel coronary artery calcifications. No pericardial effusion.   Mediastinum/Nodes: No enlarged mediastinal, hilar, or axillary lymph nodes. Thyroid gland, trachea, and esophagus demonstrate no significant findings.   Lungs/Pleura: Status post left lower lobectomy. Diffuse bilateral bronchial wall  thickening. Mild centrilobular and paraseptal emphysema. Unchanged small ground-glass nodules in the right lower lobe, including a 1.0 x 0.8 cm nodule in the dependent right lower lobe (series 4, image 100). Background of very fine centrilobular nodularity, most concentrated in the lung apices. No pleural effusion or pneumothorax.   Musculoskeletal: No chest wall abnormality. No acute osseous findings.   CT ABDOMEN PELVIS FINDINGS   Hepatobiliary: No solid liver abnormality is seen. No gallstones, gallbladder wall thickening, or biliary dilatation.   Pancreas: Unremarkable. No pancreatic ductal dilatation or surrounding inflammatory changes.   Spleen: Normal in size without significant abnormality.   Adrenals/Urinary Tract: No evident left adrenal mass or nodule (series 2, image 59). Normal right adrenal gland. Simple, benign right renal cortical cysts, for which no further follow-up or characterization is required. Kidneys are otherwise normal, without renal calculi, solid lesion, or hydronephrosis. Bladder is unremarkable.   Stomach/Bowel: Stomach is within normal limits. Status post appendectomy. No evidence of bowel wall thickening, distention, or inflammatory changes. Descending and sigmoid diverticulosis. Moderate burden of stool throughout the colon and rectum.   Vascular/Lymphatic: Severe aortic atherosclerosis. Aortobiiliac stent endograft repair. No enlarged abdominal or pelvic lymph nodes.   Reproductive: Significant interval  enlargement of a multi septated cystic lesion of the right ovary, now measuring 11.7 x 8.2 cm, previously 8.1 x 5.8 cm (series 2, image 103). Normal left ovary. Status post hysterectomy.   Other: No abdominal wall hernia or abnormality. No ascites.   Musculoskeletal: No acute osseous findings.   IMPRESSION: 1. Status post left lower lobectomy. No evidence of recurrent or metastatic disease in the chest. 2. Unchanged small ground-glass  nodules in the right lower lobe, including a 1.0 x 0.8 cm nodule in the dependent right lower lobe. These remain nonspecific, possibly infectious or inflammatory although small metachronous adenocarcinoma not excluded. Attention on follow-up. 3. Left adrenal metastasis remains resolved following treatment. 4. Significant interval enlargement of a multiseptated cystic lesion of the right ovary, now measuring 11.7 x 8.2 cm, previously 8.1 x 5.8 cm. 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 5. Emphysema and diffuse bilateral bronchial wall thickening. Background of very fine centrilobular nodularity, consistent with smoking-related respiratory bronchiolitis. 6. Aortobiiliac stent endograft repair. 7. Coronary artery disease.    Assessment:  Laura Nielsen is a 67 y.o. female diagnosed with incidentally found enlarging 11 cm right adnexal cystic mass in 2021 on imaging done for follow up of lung nodule seen on lung cancer screening scan.   On prior imaging Adnexal mass is PET negative and US shows it to be multicystic with septations with prior normal tumor markers HE4 and CA125.  She is asymptomatic.   Prior hysterectomy for bleeding 35 years ago.   Resection of lung cancer 1/23.  Medical co-morbidities complicating care: smoker, chronic back pain, Body mass index is 31.08 kg/m., aortic valve narrowing on Plavix , depression, fatty liver, CAD, and smoker  Plan:   Problem List Items Addressed This Visit   None Visit Diagnoses     Pelvic mass    -  Primary   Relevant Orders   CA 125   Human Epididymis Prot 4,Serial   Counseling and coordination of care            We discussed options for management including surgery versus radiologic guided drainage with cytology.  She is very interested in surgery.  Therefore we will request cardiac clearance.  She may need repeat stress test.  We also reviewed the pulmonary note and will ask about repeat PFTs since she had a significant lung  resection.  She will return to clinic to review all these findings as well as her CA125 and HE4 drawn today and subsequent management  The patient's diagnosis, an outline of the further diagnostic and laboratory studies which will be required, the recommendation for surgery, and alternatives were discussed with her and her accompanying family members.  All questions were answered to their satisfaction. Joaquina Nissen Leta Jungling, MD

## 2023-06-21 ENCOUNTER — Telehealth: Payer: Self-pay

## 2023-06-21 LAB — CA 125: Cancer Antigen (CA) 125: 15.8 U/mL (ref 0.0–38.1)

## 2023-06-21 NOTE — Telephone Encounter (Signed)
Call placed to [facility] cardiology to arrange cardiac clearance with Dr. Marge Duncans. Awaiting callback for scheduling. Pulmonary arranging PFT's. Voicemail left regarding update on appointments.

## 2023-06-22 LAB — HUMAN EPIDIDYMIS PROT 4,SERIAL: HE4: 123 pmol/L — ABNORMAL HIGH (ref 0.0–96.5)

## 2023-06-25 NOTE — Discharge Instructions (Signed)

## 2023-06-27 ENCOUNTER — Ambulatory Visit: Payer: Medicare Other | Admitting: Anesthesiology

## 2023-06-27 ENCOUNTER — Encounter
Admission: RE | Disposition: A | Discharge status: Home / Self Care | Payer: Self-pay | Source: Ambulatory Visit | Attending: Ophthalmology

## 2023-06-27 ENCOUNTER — Ambulatory Visit
Admission: RE | Admit: 2023-06-27 | Discharge: 2023-06-27 | Disposition: A | Discharge status: Home / Self Care | Payer: Medicare Other | Source: Ambulatory Visit | Attending: Ophthalmology | Admitting: Ophthalmology

## 2023-06-27 ENCOUNTER — Encounter: Payer: Self-pay | Admitting: Ophthalmology

## 2023-06-27 ENCOUNTER — Other Ambulatory Visit: Payer: Self-pay

## 2023-06-27 DIAGNOSIS — E669 Obesity, unspecified: Secondary | ICD-10-CM | POA: Insufficient documentation

## 2023-06-27 DIAGNOSIS — G4730 Sleep apnea, unspecified: Secondary | ICD-10-CM | POA: Insufficient documentation

## 2023-06-27 DIAGNOSIS — F32A Depression, unspecified: Secondary | ICD-10-CM | POA: Diagnosis not present

## 2023-06-27 DIAGNOSIS — Z79899 Other long term (current) drug therapy: Secondary | ICD-10-CM | POA: Insufficient documentation

## 2023-06-27 DIAGNOSIS — K219 Gastro-esophageal reflux disease without esophagitis: Secondary | ICD-10-CM | POA: Diagnosis not present

## 2023-06-27 DIAGNOSIS — Z7985 Long-term (current) use of injectable non-insulin antidiabetic drugs: Secondary | ICD-10-CM | POA: Diagnosis not present

## 2023-06-27 DIAGNOSIS — J449 Chronic obstructive pulmonary disease, unspecified: Secondary | ICD-10-CM | POA: Diagnosis not present

## 2023-06-27 DIAGNOSIS — E1151 Type 2 diabetes mellitus with diabetic peripheral angiopathy without gangrene: Secondary | ICD-10-CM | POA: Diagnosis not present

## 2023-06-27 DIAGNOSIS — Z5986 Financial insecurity: Secondary | ICD-10-CM | POA: Insufficient documentation

## 2023-06-27 DIAGNOSIS — E785 Hyperlipidemia, unspecified: Secondary | ICD-10-CM | POA: Insufficient documentation

## 2023-06-27 DIAGNOSIS — I10 Essential (primary) hypertension: Secondary | ICD-10-CM | POA: Diagnosis not present

## 2023-06-27 DIAGNOSIS — I2510 Atherosclerotic heart disease of native coronary artery without angina pectoris: Secondary | ICD-10-CM | POA: Insufficient documentation

## 2023-06-27 DIAGNOSIS — H2511 Age-related nuclear cataract, right eye: Secondary | ICD-10-CM | POA: Insufficient documentation

## 2023-06-27 DIAGNOSIS — Z6831 Body mass index (BMI) 31.0-31.9, adult: Secondary | ICD-10-CM | POA: Insufficient documentation

## 2023-06-27 DIAGNOSIS — E1136 Type 2 diabetes mellitus with diabetic cataract: Secondary | ICD-10-CM | POA: Diagnosis not present

## 2023-06-27 DIAGNOSIS — F17210 Nicotine dependence, cigarettes, uncomplicated: Secondary | ICD-10-CM | POA: Insufficient documentation

## 2023-06-27 HISTORY — PX: CATARACT EXTRACTION W/PHACO: SHX586

## 2023-06-27 HISTORY — DX: Centrilobular emphysema: J43.2

## 2023-06-27 HISTORY — DX: Malignant neoplasm of lower lobe, left bronchus or lung: C34.32

## 2023-06-27 SURGERY — PHACOEMULSIFICATION, CATARACT, WITH IOL INSERTION
Anesthesia: Monitor Anesthesia Care | Site: Eye | Laterality: Right

## 2023-06-27 MED ORDER — MIDAZOLAM HCL 2 MG/2ML IJ SOLN
INTRAMUSCULAR | Status: DC | PRN
  Administered 2023-06-27 (×2): 1 mg via INTRAVENOUS

## 2023-06-27 MED ORDER — BRIMONIDINE TARTRATE-TIMOLOL 0.2-0.5 % OP SOLN
OPHTHALMIC | Status: DC | PRN
  Administered 2023-06-27: 1 [drp] via OPHTHALMIC

## 2023-06-27 MED ORDER — LACTATED RINGERS IV SOLN: INTRAVENOUS | Status: DC

## 2023-06-27 MED ORDER — TETRACAINE HCL 0.5 % OP SOLN
1.0000 [drp] | OPHTHALMIC | Status: DC | PRN
  Administered 2023-06-27 (×3): 1 [drp] via OPHTHALMIC

## 2023-06-27 MED ORDER — SIGHTPATH DOSE#1 BSS IO SOLN
INTRAOCULAR | Status: DC | PRN
  Administered 2023-06-27: 72 mL via OPHTHALMIC

## 2023-06-27 MED ORDER — MOXIFLOXACIN HCL 0.5 % OP SOLN
OPHTHALMIC | Status: DC | PRN
  Administered 2023-06-27: .2 mL via OPHTHALMIC

## 2023-06-27 MED ORDER — FENTANYL CITRATE (PF) 100 MCG/2ML IJ SOLN
INTRAMUSCULAR | Status: DC | PRN
  Administered 2023-06-27 (×2): 50 ug via INTRAVENOUS

## 2023-06-27 MED ORDER — LIDOCAINE HCL (PF) 2 % IJ SOLN
INTRAOCULAR | Status: DC | PRN
  Administered 2023-06-27: 1 mL via INTRAOCULAR

## 2023-06-27 MED ORDER — SIGHTPATH DOSE#1 BSS IO SOLN
INTRAOCULAR | Status: DC | PRN
  Administered 2023-06-27: 15 mL

## 2023-06-27 MED ORDER — ARMC OPHTHALMIC DILATING DROPS
1.0000 | OPHTHALMIC | Status: DC | PRN
  Administered 2023-06-27 (×3): 1 via OPHTHALMIC

## 2023-06-27 MED ORDER — SIGHTPATH DOSE#1 NA HYALUR & NA CHOND-NA HYALUR IO KIT
PACK | INTRAOCULAR | Status: DC | PRN
  Administered 2023-06-27: 1 via OPHTHALMIC

## 2023-06-27 SURGICAL SUPPLY — 9 items
CATARACT SUITE SIGHTPATH (MISCELLANEOUS) ×1 IMPLANT
DISSECTOR HYDRO NUCLEUS 50X22 (MISCELLANEOUS) ×1 IMPLANT
DRSG TEGADERM 2-3/8X2-3/4 SM (GAUZE/BANDAGES/DRESSINGS) ×1 IMPLANT
FEE CATARACT SUITE SIGHTPATH (MISCELLANEOUS) ×1 IMPLANT
GLOVE SURG SYN 7.5 E (GLOVE) ×1 IMPLANT
GLOVE SURG SYN 7.5 PF PI (GLOVE) ×1 IMPLANT
GLOVE SURG SYN 8.5 E (GLOVE) ×1 IMPLANT
GLOVE SURG SYN 8.5 PF PI (GLOVE) ×1 IMPLANT
LENS IOL TECNIS EYHANCE 19.5 (Intraocular Lens) IMPLANT

## 2023-06-27 NOTE — Transfer of Care (Signed)
Immediate Anesthesia Transfer of Care Note  Patient: Laura Nielsen  Procedure(s) Performed: CATARACT EXTRACTION PHACO AND INTRAOCULAR LENS PLACEMENT (IOC) RIGHT DIABETIC  6.47  00:42.6 (Right: Eye)  Patient Location: PACU  Anesthesia Type: MAC  Level of Consciousness: awake, alert  and patient cooperative  Airway and Oxygen Therapy: Patient Spontanous Breathing and Patient connected to supplemental oxygen  Post-op Assessment: Post-op Vital signs reviewed, Patient's Cardiovascular Status Stable, Respiratory Function Stable, Patent Airway and No signs of Nausea or vomiting  Post-op Vital Signs: Reviewed and stable  Complications: No notable events documented.

## 2023-06-27 NOTE — H&P (Signed)
[facility]   Primary Care Physician:  Malva Limes, MD Ophthalmologist: Dr. Deberah Pelton  Pre-Procedure History & Physical: HPI:  Laura Nielsen is a 67 y.o. female here for cataract surgery.   Past Medical History:  Diagnosis Date   AAA (abdominal aortic aneurysm) (HCC)    s/p EVAR AAA 03/27/13   Arthritis    Asthma    Cancer (HCC)    Centrilobular emphysema (HCC)    Complication of anesthesia    BP drops after   Coronary artery disease    History of kidney stones    Osteoporosis    Primary cancer of left lower lobe of lung [facility])    Sleep apnea     Past Surgical History:  Procedure Laterality Date   ABDOMINAL AORTIC ENDOVASCULAR STENT GRAFT  03/27/2013   Dr. Levora Dredge   Cardiac catheterization  02/2009   70-80% stenosis RCA stent placed. started on Plavix   CARDIAC CATHETERIZATION     CATARACT EXTRACTION W/PHACO Left 05/14/2023   Procedure: CATARACT EXTRACTION PHACO AND INTRAOCULAR LENS PLACEMENT (IOC) LEFT DIABETIC  OMIDRIA  19.40  01:34.8;  Surgeon: Estanislado Pandy, MD;  Location: [facility];  Service: Ophthalmology;  Laterality: Left;   COLONOSCOPY WITH PROPOFOL N/A 01/05/2021   Procedure: COLONOSCOPY WITH PROPOFOL;  Surgeon: Pasty Spillers, MD;  Location: [facility] END[facility];  Service: Endoscopy;  Laterality: N/A;   CORONARY ANGIOPLASTY     stent placed   ESOPHAGOGASTRODUODENOSCOPY (EGD) WITH PROPOFOL N/A 01/05/2021   Procedure: ESOPHAGOGASTRODUODENOSCOPY (EGD) WITH PROPOFOL;  Surgeon: Pasty Spillers, MD;  Location: [facility]COPY;  Service: Endoscopy;  Laterality: N/A;   INTERCOSTAL NERVE BLOCK Left 04/06/2021   Procedure: INTERCOSTAL NERVE BLOCK;  Surgeon: Loreli Slot, MD;  Location: [facility] OR;  Service: Thoracic;  Laterality: Left;   IR IMAGING GUIDED PORT INSERTION  06/20/2022   KIDNEY STONE SURGERY  1999   LUNG LOBECTOMY Left 04/06/2021   robotic left lower lobectomy 04/06/2021 Dr. Dorris Fetch for Stage 1A adenocarcinoma    NODE DISSECTION Left 04/06/2021   Procedure: NODE DISSECTION;  Surgeon: Loreli Slot, MD;  Location: [facility] OR;  Service: Thoracic;  Laterality: Left;   TUBAL LIGATION     VAGINAL HYSTERECTOMY     Menometrorrhagia. Excessive bleeding. Unknown if cervix removed.     Prior to Admission medications   Medication Sig Start Date End Date Taking? Authorizing Provider  allopurinol (ZYLOPRIM) 100 MG tablet Take 1 tablet (100 mg total) by mouth daily. 08/28/22  Yes Malva Limes, MD  aspirin 81 MG tablet Take 81 mg by mouth daily.  03/25/09  Yes [provider]  bisoprolol (ZEBETA) 10 MG tablet Take 1 tablet (10 mg total) by mouth daily. 04/15/23  Yes Malva Limes, MD  Budeson-Glycopyrrol-Formoterol (BREZTRI AEROSPHERE) 160-9-4.8 MCG/ACT AERO Inhale 2 puffs into the lungs in the morning and at bedtime. 06/18/23  Yes Dgayli, Lianne Bushy, MD  Cholecalciferol 25 MCG (1000 UT) capsule Take 1,000 Units by mouth daily.   Yes [provider]  clopidogrel (PLAVIX) 75 MG tablet Take 1 tablet (75 mg total) by mouth daily. 08/04/22  Yes Malva Limes, MD  diazepam (VALIUM) 5 MG tablet Take 1 tablet (5 mg total) by mouth every 12 (twelve) hours as needed. 01/15/23  Yes Earna Coder, MD  HYDROcodone-acetaminophen (NORCO/VICODIN) 5-325 MG tablet Take 1-2 tablets by mouth every 6 (six) hours as needed for moderate pain. 01/14/23  Yes Sharyn Creamer, MD  ibuprofen (ADVIL) 200 MG tablet Take  400 mg by mouth 2 (two) times daily as needed (headaches).   Yes [provider]  lidocaine-prilocaine (EMLA) cream Apply on the port. 30 -45 min  prior to port access. 07/04/22  Yes Earna Coder, MD  lisinopril-hydrochlorothiazide (ZESTORETIC) 20-12.5 MG tablet Take 1 tablet by mouth daily. 03/01/23  Yes Malva Limes, MD  pantoprazole (PROTONIX) 20 MG tablet Take 1 tablet (20 mg total) by mouth daily. 03/19/23  Yes Malva Limes, MD  PROAIR HFA 108 [phone] Base) MCG/ACT inhaler Inhale 2  puffs into the lungs every 6 (six) hours as needed for wheezing or shortness of breath. 12/02/21  Yes Malva Limes, MD  rosuvastatin (CRESTOR) 20 MG tablet Take 1 tablet (20 mg total) by mouth daily. 03/01/23  Yes Malva Limes, MD  Semaglutide, 1 MG/DOSE, (OZEMPIC, 1 MG/DOSE,) 4 MG/3ML SOPN Inject 1 mg into the skin once a week. 03/19/23  Yes Malva Limes, MD  fluticasone (FLONASE) 50 MCG/ACT nasal spray Place into both nostrils daily. Patient not taking: Reported on 06/19/2023    [provider]    Allergies as of 05/16/2023 - Review Complete 05/14/2023  Allergen Reaction Noted   Atorvastatin Other (See Comments) 09/25/2016   Omeprazole Nausea And Vomiting 07/28/2015   Bupropion Nausea Only 02/12/2019    Family History  Problem Relation Age of Onset   Hypertension Mother    Coronary artery disease Mother    Heart attack Mother        acute   Cancer Mother    Alcohol abuse Father    Depression Father    Hypertension Father    Heart attack Father 73       acute   Alcohol abuse Sister    Hyperlipidemia Sister    Hypertension Sister    Cancer Sister 80   Heart attack Sister        x's 2   Coronary artery disease Sister 36       x's 2   Breast cancer Sister 48    Social History   Socioeconomic History   Marital status: Divorced    Spouse name: Not on file   Number of children: 2   Years of education: H/S   Highest education level: High school graduate  Occupational History   Occupation: Disabled   Occupation: retired  Tobacco Use   Smoking status: Every Day    Current packs/day: 1.00    Average packs/day: 0.7 packs/day for 96.1 years (62.7 ttl pk-yrs)    Types: Cigarettes    Start date: 1973   Smokeless tobacco: Never   Tobacco comments:    12/23/19 states she quit for 9 months and then started back.   Vaping Use   Vaping status: Never Used  Substance and Sexual Activity   Alcohol use: Yes    Comment: rarely - once a year   Drug use: No    Sexual activity: Not on file  Other Topics Concern   Not on file  Social History Narrative   Hx of smoking; quit prior to lung surgery then started back. Lives in [place] alone. Used to work in [employer], [employer]- Facilities manager. On disability sec to spinal pain.    Social Determinants of Health   Financial Resource Strain: Medium Risk (02/16/2023)   Overall Financial Resource Strain (CARDIA)    Difficulty of Paying Living Expenses: Somewhat hard  Food Insecurity: Patient Declined (02/16/2023)   Hunger Vital Sign    Worried About Programme researcher, broadcasting/film/video  in the Last Year: Patient declined    Ran Out of Food in the Last Year: Patient declined  Transportation Needs: No Transportation Needs (02/16/2023)   PRAPARE - Administrator, Civil Service (Medical): No    Lack of Transportation (Non-Medical): No  Physical Activity: Unknown (02/16/2023)   Exercise Vital Sign    Days of Exercise per Week: 0 days    Minutes of Exercise per Session: Patient declined  Stress: Stress Concern Present (02/16/2023)   [employer] of Occupational Health - Occupational Stress Questionnaire    Feeling of Stress : Rather much  Social Connections: Unknown (02/16/2023)   Social Connection and Isolation Panel [NHANES]    Frequency of Communication with Friends and Family: More than three times a week    Frequency of Social Gatherings with Friends and Family: Once a week    Attends Religious Services: Not on Insurance claims handler of Clubs or Organizations: No    Attends Banker Meetings: Never    Marital Status: Patient declined  Intimate Partner Violence: Not At Risk (02/20/2023)   Humiliation, Afraid, Rape, and Kick questionnaire    Fear of Current or Ex-Partner: No    Emotionally Abused: No    Physically Abused: No    Sexually Abused: No    Review of Systems: See HPI, otherwise negative ROS  Physical Exam: There were no vitals taken for this visit. General:   Alert, cooperative in  NAD Head:  Normocephalic and atraumatic. Respiratory:  Normal work of breathing. Cardiovascular:  RRR  Impression/Plan: Laura Nielsen is here for cataract surgery.  Risks, benefits, limitations, and alternatives regarding cataract surgery have been reviewed with the patient.  Questions have been answered.  All parties agreeable.   Estanislado Pandy, MD  06/27/2023, 7:09 AM

## 2023-06-27 NOTE — Anesthesia Postprocedure Evaluation (Signed)
Anesthesia Post Note  Patient: Laura Nielsen  Procedure(s) Performed: CATARACT EXTRACTION PHACO AND INTRAOCULAR LENS PLACEMENT (IOC) RIGHT DIABETIC  6.47  00:42.6 (Right: Eye)  Patient location during evaluation: PACU Anesthesia Type: MAC Level of consciousness: awake and alert Pain management: pain level controlled Vital Signs Assessment: post-procedure vital signs reviewed and stable Respiratory status: spontaneous breathing, nonlabored ventilation, respiratory function stable and patient connected to nasal cannula oxygen Cardiovascular status: stable and blood pressure returned to baseline Postop Assessment: no apparent nausea or vomiting Anesthetic complications: no   No notable events documented.   Last Vitals:  Vitals:   06/27/23 0825 06/27/23 0830  BP: 101/67 116/84  Pulse: 68 66  Resp: 18 11  Temp: 36.5 C (!) 36.4 C  SpO2: 98% 97%    Last Pain:  Vitals:   06/27/23 0830  TempSrc:   PainSc: 0-No pain                 Corinda Gubler

## 2023-06-27 NOTE — Op Note (Signed)
OPERATIVE NOTE  Laura Nielsen [id number] 06/27/2023   PREOPERATIVE DIAGNOSIS: Nuclear sclerotic cataract right eye. H25.11   POSTOPERATIVE DIAGNOSIS: Nuclear sclerotic cataract right eye. H25.11   PROCEDURE:  Phacoemusification with posterior chamber intraocular lens placement of the right eye  Ultrasound time: Procedure(s): CATARACT EXTRACTION PHACO AND INTRAOCULAR LENS PLACEMENT (IOC) RIGHT DIABETIC  6.47  00:42.6 (Right)  LENS:   Implant Name Type Inv. Item Serial No. Manufacturer Lot No. LRB No. Used Action  LENS IOL TECNIS EYHANCE 19.5 - [id number] Intraocular Lens LENS IOL TECNIS EYHANCE 19.5 [id number] [employer]  Right 1 Implanted      SURGEON:  Julious Payer. Rolley Sims, MD   ANESTHESIA:  Topical with tetracaine drops, augmented with 1% preservative-free intracameral lidocaine.   COMPLICATIONS:  None.   DESCRIPTION OF PROCEDURE:  The patient was identified in the holding room and transported to the operating room and placed in the supine position under the operating microscope.  The right eye was identified as the operative eye, which was prepped and draped in the usual sterile ophthalmic fashion.   A 1 millimeter clear-corneal paracentesis was made superotemporally. Preservative-free 1% lidocaine mixed with 1:1,000 bisulfite-free aqueous solution of epinephrine was injected into the anterior chamber. The anterior chamber was then filled with Viscoat viscoelastic. A 2.4 millimeter keratome was used to make a clear-corneal incision inferotemporally. A curvilinear capsulorrhexis was made with a cystotome and capsulorrhexis forceps. Balanced salt solution was used to hydrodissect and hydrodelineate the nucleus. Phacoemulsification was then used to remove the lens nucleus and epinucleus. The remaining cortex was then removed using the irrigation and aspiration handpiece. Provisc was then placed into the capsular bag to distend it for lens placement. A +19.50 D DIB00 intraocular lens was then  injected into the capsular bag. The remaining viscoelastic was aspirated.   Wounds were hydrated with balanced salt solution.  The anterior chamber was inflated to a physiologic pressure with balanced salt solution.  No wound leaks were noted. Vigamox was injected intracamerally.  Timolol and Brimonidine drops were applied to the eye.  The patient was taken to the recovery room in stable condition without complications of anesthesia or surgery.  Rolly Pancake [place] 06/27/2023, 8:25 AM

## 2023-06-28 ENCOUNTER — Encounter: Payer: Self-pay | Admitting: Ophthalmology

## 2023-06-29 DIAGNOSIS — I5189 Other ill-defined heart diseases: Secondary | ICD-10-CM

## 2023-06-29 DIAGNOSIS — R0609 Other forms of dyspnea: Secondary | ICD-10-CM | POA: Diagnosis not present

## 2023-06-29 DIAGNOSIS — I2510 Atherosclerotic heart disease of native coronary artery without angina pectoris: Secondary | ICD-10-CM | POA: Diagnosis not present

## 2023-06-29 HISTORY — DX: Other ill-defined heart diseases: I51.89

## 2023-07-02 ENCOUNTER — Inpatient Hospital Stay: Payer: Medicare Other

## 2023-07-02 ENCOUNTER — Inpatient Hospital Stay (HOSPITAL_BASED_OUTPATIENT_CLINIC_OR_DEPARTMENT_OTHER): Payer: Medicare Other | Admitting: Internal Medicine

## 2023-07-02 ENCOUNTER — Inpatient Hospital Stay: Payer: Medicare Other | Admitting: Oncology

## 2023-07-02 VITALS — BP 125/76 | HR 71 | Temp 96.7°F | Ht 68.0 in | Wt 205.8 lb

## 2023-07-02 DIAGNOSIS — C3432 Malignant neoplasm of lower lobe, left bronchus or lung: Secondary | ICD-10-CM

## 2023-07-02 DIAGNOSIS — Z2989 Encounter for other specified prophylactic measures: Secondary | ICD-10-CM | POA: Diagnosis not present

## 2023-07-02 DIAGNOSIS — F17210 Nicotine dependence, cigarettes, uncomplicated: Secondary | ICD-10-CM | POA: Diagnosis not present

## 2023-07-02 DIAGNOSIS — R1900 Intra-abdominal and pelvic swelling, mass and lump, unspecified site: Secondary | ICD-10-CM | POA: Diagnosis not present

## 2023-07-02 DIAGNOSIS — E039 Hypothyroidism, unspecified: Secondary | ICD-10-CM | POA: Diagnosis not present

## 2023-07-02 DIAGNOSIS — Z90710 Acquired absence of both cervix and uterus: Secondary | ICD-10-CM | POA: Diagnosis not present

## 2023-07-02 DIAGNOSIS — N9489 Other specified conditions associated with female genital organs and menstrual cycle: Secondary | ICD-10-CM

## 2023-07-02 DIAGNOSIS — Z5112 Encounter for antineoplastic immunotherapy: Secondary | ICD-10-CM | POA: Diagnosis not present

## 2023-07-02 DIAGNOSIS — C7972 Secondary malignant neoplasm of left adrenal gland: Secondary | ICD-10-CM | POA: Diagnosis not present

## 2023-07-02 LAB — CBC WITH DIFFERENTIAL/PLATELET
Abs Immature Granulocytes: 0.02 10*3/uL (ref 0.00–0.07)
Basophils Absolute: 0 10*3/uL (ref 0.0–0.1)
Basophils Relative: 1 %
Eosinophils Absolute: 0.3 10*3/uL (ref 0.0–0.5)
Eosinophils Relative: 4 %
HCT: 43.9 % (ref 36.0–46.0)
Hemoglobin: 14.4 g/dL (ref 12.0–15.0)
Immature Granulocytes: 0 %
Lymphocytes Relative: 32 %
Lymphs Abs: 2.8 10*3/uL (ref 0.7–4.0)
MCH: 28 pg (ref 26.0–34.0)
MCHC: 32.8 g/dL (ref 30.0–36.0)
MCV: 85.4 fL (ref 80.0–100.0)
Monocytes Absolute: 0.5 10*3/uL (ref 0.1–1.0)
Monocytes Relative: 6 %
Neutro Abs: 5 10*3/uL (ref 1.7–7.7)
Neutrophils Relative %: 57 %
Platelets: 201 10*3/uL (ref 150–400)
RBC: 5.14 MIL/uL — ABNORMAL HIGH (ref 3.87–5.11)
RDW: 14.4 % (ref 11.5–15.5)
WBC: 8.7 10*3/uL (ref 4.0–10.5)
nRBC: 0 % (ref 0.0–0.2)

## 2023-07-02 LAB — COMPREHENSIVE METABOLIC PANEL
ALT: 18 U/L (ref 0–44)
AST: 22 U/L (ref 15–41)
Albumin: 3.9 g/dL (ref 3.5–5.0)
Alkaline Phosphatase: 81 U/L (ref 38–126)
Anion gap: 10 (ref 5–15)
BUN: 15 mg/dL (ref 8–23)
CO2: 25 mmol/L (ref 22–32)
Calcium: 9.6 mg/dL (ref 8.9–10.3)
Chloride: 107 mmol/L (ref 98–111)
Creatinine, Ser: 0.63 mg/dL (ref 0.44–1.00)
GFR, Estimated: >60 mL/min (ref >60)
Glucose, Bld: 96 mg/dL (ref 70–99)
Potassium: 3.8 mmol/L (ref 3.5–5.1)
Sodium: 142 mmol/L (ref 135–145)
Total Bilirubin: 0.7 mg/dL (ref 0.3–1.2)
Total Protein: 7 g/dL (ref 6.5–8.1)

## 2023-07-02 LAB — TSH: TSH: 2.282 u[IU]/mL (ref 0.350–4.500)

## 2023-07-02 MED ORDER — SODIUM CHLORIDE 0.9% FLUSH
10.0000 mL | INTRAVENOUS | Status: DC | PRN
  Filled 2023-07-02: qty 10

## 2023-07-02 MED ORDER — HEPARIN SOD (PORK) LOCK FLUSH 100 UNIT/ML IV SOLN
500.0000 [IU] | Freq: Once | INTRAVENOUS | Status: AC | PRN
  Administered 2023-07-02: 500 [IU]
  Filled 2023-07-02: qty 5

## 2023-07-02 MED ORDER — SODIUM CHLORIDE 0.9 % IV SOLN
200.0000 mg | Freq: Once | INTRAVENOUS | Status: AC
  Administered 2023-07-02: 200 mg via INTRAVENOUS
  Filled 2023-07-02: qty 200

## 2023-07-02 MED ORDER — SODIUM CHLORIDE 0.9 % IV SOLN
Freq: Once | INTRAVENOUS | Status: AC
  Filled 2023-07-02: qty 250

## 2023-07-02 NOTE — Patient Instructions (Signed)
[facility]  Discharge Instructions: Thank you for choosing [facility] to provide your oncology and hematology care.  If you have a lab appointment with the Cancer Center, please go directly to the Cancer Center and check in at the registration area.  Wear comfortable clothing and clothing appropriate for easy access to any Portacath or PICC line.   We strive to give you quality time with your provider. You may need to reschedule your appointment if you arrive late (15 or more minutes).  Arriving late affects you and other patients whose appointments are after yours.  Also, if you miss three or more appointments without notifying the office, you may be dismissed from the clinic at the provider's discretion.      For prescription refill requests, have your pharmacy contact our office and allow 72 hours for refills to be completed.    Today you received the following chemotherapy and/or immunotherapy agents- Keytruda      To help prevent nausea and vomiting after your treatment, we encourage you to take your nausea medication as directed.  BELOW ARE SYMPTOMS THAT SHOULD BE REPORTED IMMEDIATELY: *FEVER GREATER THAN 100.4 F (38 C) OR HIGHER *CHILLS OR SWEATING *NAUSEA AND VOMITING THAT IS NOT CONTROLLED WITH YOUR NAUSEA MEDICATION *UNUSUAL SHORTNESS OF BREATH *UNUSUAL BRUISING OR BLEEDING *URINARY PROBLEMS (pain or burning when urinating, or frequent urination) *BOWEL PROBLEMS (unusual diarrhea, constipation, pain near the anus) TENDERNESS IN MOUTH AND THROAT WITH OR WITHOUT PRESENCE OF ULCERS (sore throat, sores in mouth, or a toothache) UNUSUAL RASH, SWELLING OR PAIN  UNUSUAL VAGINAL DISCHARGE OR ITCHING   Items with * indicate a potential emergency and should be followed up as soon as possible or go to the Emergency Department if any problems should occur.  Please show the CHEMOTHERAPY ALERT CARD or IMMUNOTHERAPY ALERT CARD at check-in to  the Emergency Department and triage nurse.  Should you have questions after your visit or need to cancel or reschedule your appointment, please contact [facility]  [phone] and follow the prompts.  Office hours are 8:00 a.m. to 4:30 p.m. Monday - Friday. Please note that voicemails left after 4:00 p.m. may not be returned until the following business day.  We are closed weekends and major holidays. You have access to a nurse at all times for urgent questions. Please call the main number to the clinic [phone] and follow the prompts.  For any non-urgent questions, you may also contact your provider using MyChart. We now offer e-Visits for anyone 18 and older to request care online for non-urgent symptoms. For details visit [email or web address].   Also download the MyChart app! Go to the app store, search "MyChart", open the app, select [facility], and log in with your MyChart username and password.   

## 2023-07-02 NOTE — Addendum Note (Signed)
Addended by: Darrold Span A on: 07/02/2023 10:41 AM   Modules accepted: Orders

## 2023-07-02 NOTE — Progress Notes (Signed)
[facility] CONSULT NOTE  Patient Care Team: Malva Limes, MD as PCP - General (Family Medicine) Lady Gary Darlin Priestly, MD as Consulting Physician (Cardiology) Schnier, Latina Craver, MD (Vascular Surgery) Lonell Face, MD as Consulting Physician (Neurology) Delano Metz, MD as Referring Physician (Pain Medicine) Pa, [facility] (Optometry) Lady Gary, Darlin Priestly, MD as Consulting Physician (Cardiology) Pasty Spillers, MD (Inactive) as Consulting Physician (Gastroenterology) Nadara Mustard, MD as Referring Physician (Obstetrics and Gynecology) Glory Buff, RN as Oncology Nurse Navigator Earna Coder, MD as Consulting Physician (Oncology)   CHIEF COMPLAINTS/PURPOSE OF CONSULTATION: lung cancer  #  Oncology History Overview Note  #MAY 2022- LLL nodule 2.3 cm Adenocarcinoma Stage IA; s/p lobectomy.  No adjuvant therapy  # CT scan 4th June 2023-highly concerning for recurrent/metastatic disease with-. New left adrenal metastasis;  Ground-glass and part solid nodules in the peripheral right lower lobe, unchanged from 11/07/2021 but slightly enlarged from 01/01/2018. Findings are suspicious for indolent adenocarcinoma.  F One- PFL1 =80%; KRAS G12C PET scan JUNE 20th- 2023-  Enlarged 4 cm hypermetabolic LEFT adrenal metastasis;  Suspicion of metastatic adenopathy to the LEFT hilum.  Part solid nodule in the RIGHT lower lobe without metabolic activity.   3 ADRENAL BIOPSY- CK7 POSITIVE ADENOCARCINOMA- There is limited tissue remaining for ancillary testing, not likely  sufficient for NGS testing.   # AUG 7th, 2023-single agent Keytruda.    Primary cancer of left lower lobe of lung (HCC)  05/09/2021 Initial Diagnosis   Primary cancer of left lower lobe of lung (HCC)   07/04/2022 -  Chemotherapy   Patient is on Treatment Plan : LUNG NSCLC Pembrolizumab (200) q21d     07/10/2022 - 07/10/2022 Chemotherapy   Patient is on Treatment Plan : LUNG NSCLC Pembrolizumab  (200) q21d      HISTORY OF PRESENTING ILLNESS: Patient  ambulating.  Alone.   Bamma Angelo 67 y.o.  female history of smoking; prior history of lung cancer-with likely recurrence to left adrenal gland [s/p Bx CK-7 positive TTF-1 negative]-clinically suggestive of recurrent lung cancer on single agent Rande Lawman is here for follow-up.   S/p evaluation with Dr.Secord.  Currently awaiting PFTs. Currently s/p cardiology and pulmonary appt.   Denies any abdominal pain. Otherwise patient continues to have mild shortness of breath on exertion.  Denies any nausea vomiting. Complains of cramping in the legs chronic.   Review of Systems  Constitutional:  Negative for chills, diaphoresis, fever, malaise/fatigue and weight loss.  HENT:  Negative for nosebleeds and sore throat.   Eyes:  Negative for double vision.  Respiratory:  Negative for cough, hemoptysis, sputum production, shortness of breath and wheezing.   Cardiovascular:  Negative for palpitations, orthopnea and leg swelling.  Gastrointestinal:  Negative for abdominal pain, blood in stool, constipation, diarrhea, heartburn, melena, nausea and vomiting.  Genitourinary:  Negative for dysuria, frequency and urgency.  Musculoskeletal:  Positive for back pain and joint pain.  Skin: Negative.  Negative for itching and rash.  Neurological:  Positive for tingling. Negative for dizziness, focal weakness, weakness and headaches.  Endo/Heme/Allergies:  Does not bruise/bleed easily.  Psychiatric/Behavioral:  Negative for depression. The patient is not nervous/anxious and does not have insomnia.      MEDICAL HISTORY:  Past Medical History:  Diagnosis Date   AAA (abdominal aortic aneurysm) (HCC)    s/p EVAR AAA 03/27/13   Arthritis    Asthma    Cancer (HCC)    Centrilobular emphysema (HCC)  Complication of anesthesia    BP drops after   Coronary artery disease    History of kidney stones    Osteoporosis    Primary cancer of left lower lobe of  lung (HCC)    Sleep apnea     SURGICAL HISTORY: Past Surgical History:  Procedure Laterality Date   ABDOMINAL AORTIC ENDOVASCULAR STENT GRAFT  03/27/2013   Dr. Levora Dredge   Cardiac catheterization  02/2009   70-80% stenosis RCA stent placed. started on Plavix   CARDIAC CATHETERIZATION     CATARACT EXTRACTION W/PHACO Left 05/14/2023   Procedure: CATARACT EXTRACTION PHACO AND INTRAOCULAR LENS PLACEMENT (IOC) LEFT DIABETIC  OMIDRIA  19.40  01:34.8;  Surgeon: Estanislado Pandy, MD;  Location: [facility];  Service: Ophthalmology;  Laterality: Left;   CATARACT EXTRACTION W/PHACO Right 06/27/2023   Procedure: CATARACT EXTRACTION PHACO AND INTRAOCULAR LENS PLACEMENT (IOC) RIGHT DIABETIC  6.47  00:42.6;  Surgeon: Estanislado Pandy, MD;  Location: [facility];  Service: Ophthalmology;  Laterality: Right;   COLONOSCOPY WITH PROPOFOL N/A 01/05/2021   Procedure: COLONOSCOPY WITH PROPOFOL;  Surgeon: Pasty Spillers, MD;  Location: [facility] END[facility]COPY;  Service: Endoscopy;  Laterality: N/A;   CORONARY ANGIOPLASTY     stent placed   ESOPHAGOGASTRODUODENOSCOPY (EGD) WITH PROPOFOL N/A 01/05/2021   Procedure: ESOPHAGOGASTRODUODENOSCOPY (EGD) WITH PROPOFOL;  Surgeon: Pasty Spillers, MD;  Location: [facility] END[facility]COPY;  Service: Endoscopy;  Laterality: N/A;   INTERCOSTAL NERVE BLOCK Left 04/06/2021   Procedure: INTERCOSTAL NERVE BLOCK;  Surgeon: Loreli Slot, MD;  Location: [facility] OR;  Service: Thoracic;  Laterality: Left;   IR IMAGING GUIDED PORT INSERTION  06/20/2022   KIDNEY STONE SURGERY  1999   LUNG LOBECTOMY Left 04/06/2021   robotic left lower lobectomy 04/06/2021 Dr. Dorris Fetch for Stage 1A adenocarcinoma   NODE DISSECTION Left 04/06/2021   Procedure: NODE DISSECTION;  Surgeon: Loreli Slot, MD;  Location: [facility] OR;  Service: Thoracic;  Laterality: Left;   TUBAL LIGATION     VAGINAL HYSTERECTOMY     Menometrorrhagia. Excessive bleeding. Unknown if cervix  removed.     SOCIAL HISTORY: Social History   Socioeconomic History   Marital status: Divorced    Spouse name: Not on file   Number of children: 2   Years of education: H/S   Highest education level: High school graduate  Occupational History   Occupation: Disabled   Occupation: retired  Tobacco Use   Smoking status: Every Day    Current packs/day: 1.00    Average packs/day: 0.7 packs/day for 96.1 years (62.7 ttl pk-yrs)    Types: Cigarettes    Start date: 1973   Smokeless tobacco: Never   Tobacco comments:    12/23/19 states she quit for 9 months and then started back.   Vaping Use   Vaping status: Never Used  Substance and Sexual Activity   Alcohol use: Yes    Comment: rarely - once a year   Drug use: No   Sexual activity: Not on file  Other Topics Concern   Not on file  Social History Narrative   Hx of smoking; quit prior to lung surgery then started back. Lives in [place] alone. Used to work in [employer], [employer]- Facilities manager. On disability sec to spinal pain.    Social Determinants of Health   Financial Resource Strain: Medium Risk (02/16/2023)   Overall Financial Resource Strain (CARDIA)    Difficulty of Paying Living Expenses: Somewhat hard  Food Insecurity: Patient Declined (02/16/2023)  Hunger Vital Sign    Worried About Running Out of Food in the Last Year: Patient declined    Ran Out of Food in the Last Year: Patient declined  Transportation Needs: No Transportation Needs (02/16/2023)   PRAPARE - Administrator, Civil Service (Medical): No    Lack of Transportation (Non-Medical): No  Physical Activity: Unknown (02/16/2023)   Exercise Vital Sign    Days of Exercise per Week: 0 days    Minutes of Exercise per Session: Patient declined  Stress: Stress Concern Present (02/16/2023)   [employer] of Occupational Health - Occupational Stress Questionnaire    Feeling of Stress : Rather much  Social Connections: Unknown (02/16/2023)   Social Connection  and Isolation Panel [NHANES]    Frequency of Communication with Friends and Family: More than three times a week    Frequency of Social Gatherings with Friends and Family: Once a week    Attends Religious Services: Not on Insurance claims handler of Clubs or Organizations: No    Attends Banker Meetings: Never    Marital Status: Patient declined  Intimate Partner Violence: Not At Risk (02/20/2023)   Humiliation, Afraid, Rape, and Kick questionnaire    Fear of Current or Ex-Partner: No    Emotionally Abused: No    Physically Abused: No    Sexually Abused: No    FAMILY HISTORY: Family History  Problem Relation Age of Onset   Hypertension Mother    Coronary artery disease Mother    Heart attack Mother        acute   Cancer Mother    Alcohol abuse Father    Depression Father    Hypertension Father    Heart attack Father 68       acute   Alcohol abuse Sister    Hyperlipidemia Sister    Hypertension Sister    Cancer Sister 43   Heart attack Sister        x's 2   Coronary artery disease Sister 78       x's 2   Breast cancer Sister 45    ALLERGIES:  is allergic to atorvastatin, omeprazole, and bupropion.  MEDICATIONS:  Current Outpatient Medications  Medication Sig Dispense Refill   allopurinol (ZYLOPRIM) 100 MG tablet Take 1 tablet (100 mg total) by mouth daily. 90 tablet 3   aspirin 81 MG tablet Take 81 mg by mouth daily.      bisoprolol (ZEBETA) 10 MG tablet Take 1 tablet (10 mg total) by mouth daily. 90 tablet 1   Budeson-Glycopyrrol-Formoterol (BREZTRI AEROSPHERE) 160-9-4.8 MCG/ACT AERO Inhale 2 puffs into the lungs in the morning and at bedtime. 1284 g 11   Cholecalciferol 25 MCG (1000 UT) capsule Take 1,000 Units by mouth daily.     clopidogrel (PLAVIX) 75 MG tablet Take 1 tablet (75 mg total) by mouth daily. 90 tablet 4   diazepam (VALIUM) 5 MG tablet Take 1 tablet (5 mg total) by mouth every 12 (twelve) hours as needed. 30 tablet 0    HYDROcodone-acetaminophen (NORCO/VICODIN) 5-325 MG tablet Take 1-2 tablets by mouth every 6 (six) hours as needed for moderate pain. 20 tablet 0   ibuprofen (ADVIL) 200 MG tablet Take 400 mg by mouth 2 (two) times daily as needed (headaches).     lidocaine-prilocaine (EMLA) cream Apply on the port. 30 -45 min  prior to port access. 30 g 3   lisinopril-hydrochlorothiazide (ZESTORETIC) 20-12.5 MG tablet Take 1 tablet by  mouth daily. 90 tablet 1   pantoprazole (PROTONIX) 20 MG tablet Take 1 tablet (20 mg total) by mouth daily. 90 tablet 1   PROAIR HFA 108 (90 Base) MCG/ACT inhaler Inhale 2 puffs into the lungs every 6 (six) hours as needed for wheezing or shortness of breath. 8.5 g 4   rosuvastatin (CRESTOR) 20 MG tablet Take 1 tablet (20 mg total) by mouth daily. 90 tablet 1   Semaglutide, 1 MG/DOSE, (OZEMPIC, 1 MG/DOSE,) 4 MG/3ML SOPN Inject 1 mg into the skin once a week. 9 mL 1   fluticasone (FLONASE) 50 MCG/ACT nasal spray Place into both nostrils daily. (Patient not taking: Reported on 06/19/2023)     No current facility-administered medications for this visit.   Facility-Administered Medications Ordered in Other Visits  Medication Dose Route Frequency Provider Last Rate Last Admin   heparin lock flush 100 UNIT/ML injection             Mild maculopapular rash on the Maller area left more than right.  PHYSICAL EXAMINATION: ECOG PERFORMANCE STATUS: 1 - Symptomatic but completely ambulatory  Vitals:   07/02/23 0919  BP: 125/76  Pulse: 71  Temp: (!) 96.7 F (35.9 C)  SpO2: 100%     Filed Weights   07/02/23 0919  Weight: 205 lb 12.8 oz (93.4 kg)      Physical Exam HENT:     Head: Normocephalic and atraumatic.     Mouth/Throat:     Pharynx: No oropharyngeal exudate.  Eyes:     Pupils: Pupils are equal, round, and reactive to light.  Cardiovascular:     Rate and Rhythm: Normal rate and regular rhythm.  Pulmonary:     Comments: Decreased breath sounds bilaterally.  No  wheeze or crackles Abdominal:     General: Bowel sounds are normal. There is no distension.     Palpations: Abdomen is soft. There is no mass.     Tenderness: There is no abdominal tenderness. There is no guarding or rebound.  Musculoskeletal:        General: No tenderness. Normal range of motion.     Cervical back: Normal range of motion and neck supple.  Skin:    General: Skin is warm.  Neurological:     Mental Status: She is alert and oriented to person, place, and time.  Psychiatric:        Mood and Affect: Affect normal.      LABORATORY DATA:  I have reviewed the data as listed Lab Results  Component Value Date   WBC 8.7 07/02/2023   HGB 14.4 07/02/2023   HCT 43.9 07/02/2023   MCV 85.4 07/02/2023   PLT 201 07/02/2023   Recent Labs    05/01/23 0820 05/21/23 1025 06/11/23 0806  NA 137 139 140  K 3.5 3.7 3.6  CL 102 106 107  CO2 25 25 25   GLUCOSE 105* 100* 103*  BUN 19 20 16   CREATININE 0.65 0.69 0.66  CALCIUM 9.7 9.4 9.5  GFRNONAA >60 >60 >60  PROT 7.5 7.0 6.5  ALBUMIN 4.1 4.1 3.9  AST 22 20 20   ALT 15 15 13   ALKPHOS 87 92 83  BILITOT 0.6 0.9 0.6    RADIOGRAPHIC STUDIES: I have personally reviewed the radiological images as listed and agreed with the findings in the report. No results found.   ASSESSMENT & PLAN:   Primary cancer of left lower lobe of lung (HCC) #JUNE 2023-STAGE IV--recurrent/metastatic disease with New left adrenal metastasis;  PET  scan JUNE 20th- 2023-  Enlarged 4 cm hypermetabolic LEFT adrenal metastasis;  Suspicion of metastatic adenopathy to the LEFT hilum.  Part solid nodule in the RIGHT lower lobe without metabolic activity. 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 Currently on single agent Keytruda [PD-L1 greater than 80% ].  Currently on single agent Keytruda.   # # CT scan- June 13th, 2024- Status post left lower  lobectomy. No evidence of recurrent or metastatic disease in the chest; Unchanged small ground-glass nodules in the right lower lobe, including a 1.0 x 0.8 cm nodule in the dependent right lower lobe- stable.   # Proceed of Keytruda single agent. Labs today reviewed;  acceptable for treatment today. Tolerating well except for mild chills. TSH- MAY 2024- WNL. Stable.   # Right mildly complex cystic adnexal mass noted-June 13th, 2024- Significant interval enlargement of a multiseptated cystic lesion of the right ovary, now measuring 11.7 x 8.2 cm, previously 8.1 x 5.8 cm. 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  Awaiting cardiac clearance; awaiting PFTs. S/p evaluation.   # mild Hypokalemia: discussed dietary supplement. stable  # Chronic pain: headaches/cervical pain/back pain [Dr.Naveira; pain doctor]  On NSAIDs [ monitor for now. OCT 2023- MRI brain- NEGATIVE for any metastatic disease.   Stable.  FEB 2024- MRI lumbar spine- degenerative disease- on ibuprofen prn- BID. stable  # Left rib pain-Post throacotomy pain/tingling and numbness:  Continue gabapentin -300 mg TID; and then at extra at night prn. stable  # CAD/ PVD- cramping- s/p evaluation [Dr.Schneir] dec 2023 stable  # COPD-stable encouraged continue to avoid smoking; again counseled to quit smoking; s/p pulmonary; awaiting PFTs-stable.   # IV Access :s/p  port placement- stable  *AM appts- Monday-   # DISPOSITION:  # keytruda today; # Follow-up in 3  weeks-MD labs/port- cbc/cmp;TSH; Keytruda -  Dr. Fabio Neighbors, MD 07/02/2023 10:22 AM

## 2023-07-02 NOTE — Progress Notes (Signed)
No concerns today.  Has a PFT this Thursday.

## 2023-07-02 NOTE — Assessment & Plan Note (Addendum)
#  JUNE 2023-STAGE IV--recurrent/metastatic disease with New left adrenal metastasis;  PET scan JUNE 20th- 2023-  Enlarged 4 cm hypermetabolic LEFT adrenal metastasis;  Suspicion of metastatic adenopathy to the LEFT hilum.  Part solid nodule in the RIGHT lower lobe without metabolic activity. 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 Currently on single agent Keytruda [PD-L1 greater than 80% ].  Currently on single agent Keytruda.   # # CT scan- June 13th, 2024- Status post left lower lobectomy. No evidence of recurrent or metastatic disease in the chest; Unchanged small ground-glass nodules in the right lower lobe, including a 1.0 x 0.8 cm nodule in the dependent right lower lobe- stable.   # Proceed of Keytruda single agent. Labs today reviewed;  acceptable for treatment today. Tolerating well except for mild chills. TSH- MAY 2024- WNL. Stable.   # Right mildly complex cystic adnexal mass noted-June 13th, 2024- Significant interval enlargement of a multiseptated cystic lesion of the right ovary, now measuring 11.7 x 8.2 cm, previously 8.1 x 5.8 cm. 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  Awaiting cardiac clearance; awaiting PFTs. S/p evaluation.   # mild Hypokalemia: discussed dietary supplement. stable  # Chronic pain: headaches/cervical pain/back pain [Dr.Naveira; pain doctor]  On NSAIDs [ monitor for now. OCT 2023- MRI brain- NEGATIVE for any metastatic disease.   Stable.  FEB 2024- MRI lumbar spine- degenerative disease- on ibuprofen prn- BID. stable  # Left rib pain-Post throacotomy pain/tingling and numbness:  Continue gabapentin -300 mg TID; and then at extra at night prn. stable  # CAD/ PVD- cramping- s/p evaluation [Dr.Schneir] dec 2023 stable  # COPD-stable encouraged continue to avoid smoking; again counseled to quit smoking; s/p pulmonary; awaiting PFTs-stable.   #  IV Access :s/p  port placement- stable  *AM appts- Monday-   # DISPOSITION:  # keytruda today; # Follow-up in 3  weeks-MD labs/port- cbc/cmp;TSH; IOEVOJJK -  Dr. Leonard Schwartz

## 2023-07-05 ENCOUNTER — Ambulatory Visit: Payer: Medicare Other | Attending: Student in an Organized Health Care Education/Training Program

## 2023-07-05 DIAGNOSIS — J432 Centrilobular emphysema: Secondary | ICD-10-CM | POA: Diagnosis not present

## 2023-07-05 MED ORDER — ALBUTEROL SULFATE (2.5 MG/3ML) 0.083% IN NEBU
2.5000 mg | INHALATION_SOLUTION | Freq: Once | RESPIRATORY_TRACT | Status: AC
  Administered 2023-07-05: 2.5 mg via RESPIRATORY_TRACT

## 2023-07-09 DIAGNOSIS — I2510 Atherosclerotic heart disease of native coronary artery without angina pectoris: Secondary | ICD-10-CM | POA: Diagnosis not present

## 2023-07-09 DIAGNOSIS — Z720 Tobacco use: Secondary | ICD-10-CM | POA: Diagnosis not present

## 2023-07-09 DIAGNOSIS — I38 Endocarditis, valve unspecified: Secondary | ICD-10-CM | POA: Diagnosis not present

## 2023-07-09 DIAGNOSIS — I10 Essential (primary) hypertension: Secondary | ICD-10-CM | POA: Diagnosis not present

## 2023-07-16 DIAGNOSIS — I2510 Atherosclerotic heart disease of native coronary artery without angina pectoris: Secondary | ICD-10-CM | POA: Diagnosis not present

## 2023-07-17 ENCOUNTER — Telehealth (INDEPENDENT_AMBULATORY_CARE_PROVIDER_SITE_OTHER): Payer: Self-pay

## 2023-07-17 NOTE — Telephone Encounter (Signed)
Patient left a voicemail informing that her cardiologist and cancer provider both advise for her to seen in the office at sooner date. Patient is having cramping near the ankle and the top of the foot. Patient is schedule to return in 11/26/2023. I spoke with Dr Wyn Quaker and he is fine with moving patient appointment to earlier date. Patient appointment will be moved to sooner date.

## 2023-07-23 ENCOUNTER — Encounter: Payer: Self-pay | Admitting: Internal Medicine

## 2023-07-23 ENCOUNTER — Inpatient Hospital Stay: Payer: Medicare Other

## 2023-07-23 ENCOUNTER — Inpatient Hospital Stay (HOSPITAL_BASED_OUTPATIENT_CLINIC_OR_DEPARTMENT_OTHER): Payer: Medicare Other | Admitting: Internal Medicine

## 2023-07-23 ENCOUNTER — Inpatient Hospital Stay: Payer: Medicare Other | Attending: Internal Medicine

## 2023-07-23 VITALS — BP 112/68 | HR 65 | Temp 97.4°F | Resp 18

## 2023-07-23 VITALS — BP 130/86 | HR 72 | Temp 98.0°F | Ht 68.0 in | Wt 201.6 lb

## 2023-07-23 DIAGNOSIS — C3432 Malignant neoplasm of lower lobe, left bronchus or lung: Secondary | ICD-10-CM | POA: Insufficient documentation

## 2023-07-23 DIAGNOSIS — Z79899 Other long term (current) drug therapy: Secondary | ICD-10-CM | POA: Insufficient documentation

## 2023-07-23 DIAGNOSIS — F17210 Nicotine dependence, cigarettes, uncomplicated: Secondary | ICD-10-CM | POA: Insufficient documentation

## 2023-07-23 DIAGNOSIS — Z79620 Long term (current) use of immunosuppressive biologic: Secondary | ICD-10-CM | POA: Diagnosis not present

## 2023-07-23 DIAGNOSIS — Z5112 Encounter for antineoplastic immunotherapy: Secondary | ICD-10-CM | POA: Diagnosis not present

## 2023-07-23 DIAGNOSIS — N9489 Other specified conditions associated with female genital organs and menstrual cycle: Secondary | ICD-10-CM

## 2023-07-23 DIAGNOSIS — Z2989 Encounter for other specified prophylactic measures: Secondary | ICD-10-CM

## 2023-07-23 DIAGNOSIS — E039 Hypothyroidism, unspecified: Secondary | ICD-10-CM

## 2023-07-23 DIAGNOSIS — C7972 Secondary malignant neoplasm of left adrenal gland: Secondary | ICD-10-CM | POA: Insufficient documentation

## 2023-07-23 LAB — CBC WITH DIFFERENTIAL/PLATELET
Abs Immature Granulocytes: 0.01 10*3/uL (ref 0.00–0.07)
Basophils Absolute: 0.1 10*3/uL (ref 0.0–0.1)
Basophils Relative: 1 %
Eosinophils Absolute: 0.3 10*3/uL (ref 0.0–0.5)
Eosinophils Relative: 3 %
HCT: 45.8 % (ref 36.0–46.0)
Hemoglobin: 15 g/dL (ref 12.0–15.0)
Immature Granulocytes: 0 %
Lymphocytes Relative: 33 %
Lymphs Abs: 3.1 10*3/uL (ref 0.7–4.0)
MCH: 28 pg (ref 26.0–34.0)
MCHC: 32.8 g/dL (ref 30.0–36.0)
MCV: 85.6 fL (ref 80.0–100.0)
Monocytes Absolute: 0.6 10*3/uL (ref 0.1–1.0)
Monocytes Relative: 6 %
Neutro Abs: 5.3 10*3/uL (ref 1.7–7.7)
Neutrophils Relative %: 57 %
Platelets: 227 10*3/uL (ref 150–400)
RBC: 5.35 MIL/uL — ABNORMAL HIGH (ref 3.87–5.11)
RDW: 14.1 % (ref 11.5–15.5)
WBC: 9.3 10*3/uL (ref 4.0–10.5)
nRBC: 0 % (ref 0.0–0.2)

## 2023-07-23 LAB — COMPREHENSIVE METABOLIC PANEL
ALT: 16 U/L (ref 0–44)
AST: 24 U/L (ref 15–41)
Albumin: 4 g/dL (ref 3.5–5.0)
Alkaline Phosphatase: 87 U/L (ref 38–126)
Anion gap: 6 (ref 5–15)
BUN: 21 mg/dL (ref 8–23)
CO2: 24 mmol/L (ref 22–32)
Calcium: 9.4 mg/dL (ref 8.9–10.3)
Chloride: 106 mmol/L (ref 98–111)
Creatinine, Ser: 0.67 mg/dL (ref 0.44–1.00)
GFR, Estimated: >60 mL/min (ref >60)
Glucose, Bld: 93 mg/dL (ref 70–99)
Potassium: 3.7 mmol/L (ref 3.5–5.1)
Sodium: 136 mmol/L (ref 135–145)
Total Bilirubin: 0.7 mg/dL (ref 0.3–1.2)
Total Protein: 7 g/dL (ref 6.5–8.1)

## 2023-07-23 LAB — TSH: TSH: 1.958 u[IU]/mL (ref 0.350–4.500)

## 2023-07-23 MED ORDER — SODIUM CHLORIDE 0.9 % IV SOLN
200.0000 mg | Freq: Once | INTRAVENOUS | Status: AC
  Administered 2023-07-23: 200 mg via INTRAVENOUS
  Filled 2023-07-23: qty 8

## 2023-07-23 MED ORDER — HEPARIN SOD (PORK) LOCK FLUSH 100 UNIT/ML IV SOLN
500.0000 [IU] | Freq: Once | INTRAVENOUS | Status: AC | PRN
  Administered 2023-07-23: 500 [IU]
  Filled 2023-07-23: qty 5

## 2023-07-23 MED ORDER — SODIUM CHLORIDE 0.9 % IV SOLN
Freq: Once | INTRAVENOUS | Status: AC
  Filled 2023-07-23: qty 250

## 2023-07-23 NOTE — Patient Instructions (Signed)
[facility]  Discharge Instructions: Thank you for choosing [facility] to provide your oncology and hematology care.  If you have a lab appointment with the Cancer Center, please go directly to the Cancer Center and check in at the registration area.  Wear comfortable clothing and clothing appropriate for easy access to any Portacath or PICC line.   We strive to give you quality time with your provider. You may need to reschedule your appointment if you arrive late (15 or more minutes).  Arriving late affects you and other patients whose appointments are after yours.  Also, if you miss three or more appointments without notifying the office, you may be dismissed from the clinic at the provider's discretion.      For prescription refill requests, have your pharmacy contact our office and allow 72 hours for refills to be completed.    Today you received the following chemotherapy and/or immunotherapy agents- keytruda      To help prevent nausea and vomiting after your treatment, we encourage you to take your nausea medication as directed.  BELOW ARE SYMPTOMS THAT SHOULD BE REPORTED IMMEDIATELY: *FEVER GREATER THAN 100.4 F (38 C) OR HIGHER *CHILLS OR SWEATING *NAUSEA AND VOMITING THAT IS NOT CONTROLLED WITH YOUR NAUSEA MEDICATION *UNUSUAL SHORTNESS OF BREATH *UNUSUAL BRUISING OR BLEEDING *URINARY PROBLEMS (pain or burning when urinating, or frequent urination) *BOWEL PROBLEMS (unusual diarrhea, constipation, pain near the anus) TENDERNESS IN MOUTH AND THROAT WITH OR WITHOUT PRESENCE OF ULCERS (sore throat, sores in mouth, or a toothache) UNUSUAL RASH, SWELLING OR PAIN  UNUSUAL VAGINAL DISCHARGE OR ITCHING   Items with * indicate a potential emergency and should be followed up as soon as possible or go to the Emergency Department if any problems should occur.  Please show the CHEMOTHERAPY ALERT CARD or IMMUNOTHERAPY ALERT CARD at check-in to  the Emergency Department and triage nurse.  Should you have questions after your visit or need to cancel or reschedule your appointment, please contact [facility]  [phone] and follow the prompts.  Office hours are 8:00 a.m. to 4:30 p.m. Monday - Friday. Please note that voicemails left after 4:00 p.m. may not be returned until the following business day.  We are closed weekends and major holidays. You have access to a nurse at all times for urgent questions. Please call the main number to the clinic [phone] and follow the prompts.  For any non-urgent questions, you may also contact your provider using MyChart. We now offer e-Visits for anyone 18 and older to request care online for non-urgent symptoms. For details visit [email or web address].   Also download the MyChart app! Go to the app store, search "MyChart", open the app, select , and log in with your MyChart username and password.    

## 2023-07-23 NOTE — Progress Notes (Signed)
[facility] CONSULT NOTE  Patient Care Team: Malva Limes, MD as PCP - General (Family Medicine) Lady Gary Darlin Priestly, MD as Consulting Physician (Cardiology) Schnier, Latina Craver, MD (Vascular Surgery) Lonell Face, MD as Consulting Physician (Neurology) Delano Metz, MD as Referring Physician (Pain Medicine) Pa, [facility] (Optometry) Lady Gary, Darlin Priestly, MD as Consulting Physician (Cardiology) Pasty Spillers, MD (Inactive) as Consulting Physician (Gastroenterology) Nadara Mustard, MD as Referring Physician (Obstetrics and Gynecology) Glory Buff, RN as Oncology Nurse Navigator Earna Coder, MD as Consulting Physician (Oncology)   CHIEF COMPLAINTS/PURPOSE OF CONSULTATION: lung cancer  #  Oncology History Overview Note  #MAY 2022- LLL nodule 2.3 cm Adenocarcinoma Stage IA; s/p lobectomy.  No adjuvant therapy  # CT scan 4th June 2023-highly concerning for recurrent/metastatic disease with-. New left adrenal metastasis;  Ground-glass and part solid nodules in the peripheral right lower lobe, unchanged from 11/07/2021 but slightly enlarged from 01/01/2018. Findings are suspicious for indolent adenocarcinoma.  F One- PFL1 =80%; KRAS G12C PET scan JUNE 20th- 2023-  Enlarged 4 cm hypermetabolic LEFT adrenal metastasis;  Suspicion of metastatic adenopathy to the LEFT hilum.  Part solid nodule in the RIGHT lower lobe without metabolic activity.   3 ADRENAL BIOPSY- CK7 POSITIVE ADENOCARCINOMA- There is limited tissue remaining for ancillary testing, not likely  sufficient for NGS testing.   # AUG 7th, 2023-single agent Keytruda.    Primary cancer of left lower lobe of lung (HCC)  05/09/2021 Initial Diagnosis   Primary cancer of left lower lobe of lung (HCC)   07/04/2022 -  Chemotherapy   Patient is on Treatment Plan : LUNG NSCLC Pembrolizumab (200) q21d     07/10/2022 - 07/10/2022 Chemotherapy   Patient is on Treatment Plan : LUNG NSCLC Pembrolizumab  (200) q21d      HISTORY OF PRESENTING ILLNESS: Patient  ambulating.  Alone.   Laura Nielsen 67 y.o.  female history of smoking; prior history of lung cancer-with likely recurrence to left adrenal gland [s/p Bx CK-7 positive TTF-1 negative]-clinically suggestive of recurrent lung cancer on single agent Rande Lawman is here for follow-up.   Patient seems over whelmed re: up coming pre-op evaluation.   Patient says that she is stressed due to her lower abdomin mass.   Fatigue, shortness of breath sometimes, and her ankles are still bothering her.   Currently s/p cardiology and pulmonary appt.    Denies any abdominal pain. Otherwise patient continues to have mild shortness of breath on exertion.  Denies any nausea vomiting. Complains of cramping in the legs chronic.   Review of Systems  Constitutional:  Negative for chills, diaphoresis, fever, malaise/fatigue and weight loss.  HENT:  Negative for nosebleeds and sore throat.   Eyes:  Negative for double vision.  Respiratory:  Negative for cough, hemoptysis, sputum production, shortness of breath and wheezing.   Cardiovascular:  Negative for palpitations, orthopnea and leg swelling.  Gastrointestinal:  Negative for abdominal pain, blood in stool, constipation, diarrhea, heartburn, melena, nausea and vomiting.  Genitourinary:  Negative for dysuria, frequency and urgency.  Musculoskeletal:  Positive for back pain and joint pain.  Skin: Negative.  Negative for itching and rash.  Neurological:  Positive for tingling. Negative for dizziness, focal weakness, weakness and headaches.  Endo/Heme/Allergies:  Does not bruise/bleed easily.  Psychiatric/Behavioral:  Negative for depression. The patient is not nervous/anxious and does not have insomnia.      MEDICAL HISTORY:  Past Medical History:  Diagnosis Date   AAA (  abdominal aortic aneurysm) (HCC)    s/p EVAR AAA 03/27/13   Arthritis    Asthma    Cancer (HCC)    Centrilobular emphysema (HCC)     Complication of anesthesia    BP drops after   Coronary artery disease    History of kidney stones    Osteoporosis    Primary cancer of left lower lobe of lung (HCC)    Sleep apnea     SURGICAL HISTORY: Past Surgical History:  Procedure Laterality Date   ABDOMINAL AORTIC ENDOVASCULAR STENT GRAFT  03/27/2013   Dr. Levora Dredge   Cardiac catheterization  02/2009   70-80% stenosis RCA stent placed. started on Plavix   CARDIAC CATHETERIZATION     CATARACT EXTRACTION W/PHACO Left 05/14/2023   Procedure: CATARACT EXTRACTION PHACO AND INTRAOCULAR LENS PLACEMENT (IOC) LEFT DIABETIC  OMIDRIA  19.40  01:34.8;  Surgeon: Estanislado Pandy, MD;  Location: [facility];  Service: Ophthalmology;  Laterality: Left;   CATARACT EXTRACTION W/PHACO Right 06/27/2023   Procedure: CATARACT EXTRACTION PHACO AND INTRAOCULAR LENS PLACEMENT (IOC) RIGHT DIABETIC  6.47  00:42.6;  Surgeon: Estanislado Pandy, MD;  Location: [facility];  Service: Ophthalmology;  Laterality: Right;   COLONOSCOPY WITH PROPOFOL N/A 01/05/2021   Procedure: COLONOSCOPY WITH PROPOFOL;  Surgeon: Pasty Spillers, MD;  Location: [facility] END[facility];  Service: Endoscopy;  Laterality: N/A;   CORONARY ANGIOPLASTY     stent placed   ESOPHAGOGASTRODUODENOSCOPY (EGD) WITH PROPOFOL N/A 01/05/2021   Procedure: ESOPHAGOGASTRODUODENOSCOPY (EGD) WITH PROPOFOL;  Surgeon: Pasty Spillers, MD;  Location: [facility] END[facility]COPY;  Service: Endoscopy;  Laterality: N/A;   INTERCOSTAL NERVE BLOCK Left 04/06/2021   Procedure: INTERCOSTAL NERVE BLOCK;  Surgeon: Loreli Slot, MD;  Location: [facility] OR;  Service: Thoracic;  Laterality: Left;   IR IMAGING GUIDED PORT INSERTION  06/20/2022   KIDNEY STONE SURGERY  1999   LUNG LOBECTOMY Left 04/06/2021   robotic left lower lobectomy 04/06/2021 Dr. Dorris Fetch for Stage 1A adenocarcinoma   NODE DISSECTION Left 04/06/2021   Procedure: NODE DISSECTION;  Surgeon: Loreli Slot, MD;   Location: [facility] OR;  Service: Thoracic;  Laterality: Left;   TUBAL LIGATION     VAGINAL HYSTERECTOMY     Menometrorrhagia. Excessive bleeding. Unknown if cervix removed.     SOCIAL HISTORY: Social History   Socioeconomic History   Marital status: Divorced    Spouse name: Not on file   Number of children: 2   Years of education: H/S   Highest education level: High school graduate  Occupational History   Occupation: Disabled   Occupation: retired  Tobacco Use   Smoking status: Every Day    Current packs/day: 1.00    Average packs/day: 0.7 packs/day for 96.1 years (62.8 ttl pk-yrs)    Types: Cigarettes    Start date: 1973   Smokeless tobacco: Never   Tobacco comments:    12/23/19 states she quit for 9 months and then started back.   Vaping Use   Vaping status: Never Used  Substance and Sexual Activity   Alcohol use: Yes    Comment: rarely - once a year   Drug use: No   Sexual activity: Not on file  Other Topics Concern   Not on file  Social History Narrative   Hx of smoking; quit prior to lung surgery then started back. Lives in [place] alone. Used to work in [employer], [employer]- Facilities manager. On disability sec to spinal pain.    Social Determinants of Health  Financial Resource Strain: Medium Risk (02/16/2023)   Overall Financial Resource Strain (CARDIA)    Difficulty of Paying Living Expenses: Somewhat hard  Food Insecurity: Patient Declined (02/16/2023)   Hunger Vital Sign    Worried About Running Out of Food in the Last Year: Patient declined    Ran Out of Food in the Last Year: Patient declined  Transportation Needs: No Transportation Needs (02/16/2023)   PRAPARE - Administrator, Civil Service (Medical): No    Lack of Transportation (Non-Medical): No  Physical Activity: Unknown (02/16/2023)   Exercise Vital Sign    Days of Exercise per Week: 0 days    Minutes of Exercise per Session: Patient declined  Stress: Stress Concern Present (02/16/2023)   [employer] of Occupational Health - Occupational Stress Questionnaire    Feeling of Stress : Rather much  Social Connections: Unknown (02/16/2023)   Social Connection and Isolation Panel [NHANES]    Frequency of Communication with Friends and Family: More than three times a week    Frequency of Social Gatherings with Friends and Family: Once a week    Attends Religious Services: Not on Insurance claims handler of Clubs or Organizations: No    Attends Banker Meetings: Never    Marital Status: Patient declined  Intimate Partner Violence: Not At Risk (02/20/2023)   Humiliation, Afraid, Rape, and Kick questionnaire    Fear of Current or Ex-Partner: No    Emotionally Abused: No    Physically Abused: No    Sexually Abused: No    FAMILY HISTORY: Family History  Problem Relation Age of Onset   Hypertension Mother    Coronary artery disease Mother    Heart attack Mother        acute   Cancer Mother    Alcohol abuse Father    Depression Father    Hypertension Father    Heart attack Father 34       acute   Alcohol abuse Sister    Hyperlipidemia Sister    Hypertension Sister    Cancer Sister 71   Heart attack Sister        x's 2   Coronary artery disease Sister 32       x's 2   Breast cancer Sister 43    ALLERGIES:  is allergic to atorvastatin, omeprazole, and bupropion.  MEDICATIONS:  Current Outpatient Medications  Medication Sig Dispense Refill   allopurinol (ZYLOPRIM) 100 MG tablet Take 1 tablet (100 mg total) by mouth daily. 90 tablet 3   aspirin 81 MG tablet Take 81 mg by mouth daily.      bisoprolol (ZEBETA) 10 MG tablet Take 1 tablet (10 mg total) by mouth daily. 90 tablet 1   Budeson-Glycopyrrol-Formoterol (BREZTRI AEROSPHERE) 160-9-4.8 MCG/ACT AERO Inhale 2 puffs into the lungs in the morning and at bedtime. 1284 g 11   Cholecalciferol 25 MCG (1000 UT) capsule Take 1,000 Units by mouth daily.     clopidogrel (PLAVIX) 75 MG tablet Take 1 tablet (75 mg total)  by mouth daily. 90 tablet 4   diazepam (VALIUM) 5 MG tablet Take 1 tablet (5 mg total) by mouth every 12 (twelve) hours as needed. 30 tablet 0   HYDROcodone-acetaminophen (NORCO/VICODIN) 5-325 MG tablet Take 1-2 tablets by mouth every 6 (six) hours as needed for moderate pain. 20 tablet 0   ibuprofen (ADVIL) 200 MG tablet Take 400 mg by mouth 2 (two) times daily as needed (headaches).  lidocaine-prilocaine (EMLA) cream Apply on the port. 30 -45 min  prior to port access. 30 g 3   lisinopril-hydrochlorothiazide (ZESTORETIC) 20-12.5 MG tablet Take 1 tablet by mouth daily. 90 tablet 1   pantoprazole (PROTONIX) 20 MG tablet Take 1 tablet (20 mg total) by mouth daily. 90 tablet 1   PROAIR HFA 108 (90 Base) MCG/ACT inhaler Inhale 2 puffs into the lungs every 6 (six) hours as needed for wheezing or shortness of breath. 8.5 g 4   rosuvastatin (CRESTOR) 20 MG tablet Take 1 tablet (20 mg total) by mouth daily. 90 tablet 1   Semaglutide, 1 MG/DOSE, (OZEMPIC, 1 MG/DOSE,) 4 MG/3ML SOPN Inject 1 mg into the skin once a week. 9 mL 1   fluticasone (FLONASE) 50 MCG/ACT nasal spray Place into both nostrils daily. (Patient not taking: Reported on 06/19/2023)     No current facility-administered medications for this visit.   Facility-Administered Medications Ordered in Other Visits  Medication Dose Route Frequency Provider Last Rate Last Admin   heparin lock flush 100 UNIT/ML injection            heparin lock flush 100 unit/mL  500 Units Intracatheter Once PRN Earna Coder, MD       pembrolizumab [facility]) 200 mg in sodium chloride 0.9 % 50 mL chemo infusion  200 mg Intravenous Once Earna Coder, MD        Mild maculopapular rash on the [place]er area left more than right.  PHYSICAL EXAMINATION: ECOG PERFORMANCE STATUS: 1 - Symptomatic but completely ambulatory  Vitals:   07/23/23 0807  BP: 130/86  Pulse: 72  Temp: 98 F (36.7 C)  SpO2: 99%      Filed Weights   07/23/23 0807   Weight: 201 lb 9.6 oz (91.4 kg)       Physical Exam HENT:     Head: Normocephalic and atraumatic.     Mouth/Throat:     Pharynx: No oropharyngeal exudate.  Eyes:     Pupils: Pupils are equal, round, and reactive to light.  Cardiovascular:     Rate and Rhythm: Normal rate and regular rhythm.  Pulmonary:     Comments: Decreased breath sounds bilaterally.  No wheeze or crackles Abdominal:     General: Bowel sounds are normal. There is no distension.     Palpations: Abdomen is soft. There is no mass.     Tenderness: There is no abdominal tenderness. There is no guarding or rebound.  Musculoskeletal:        General: No tenderness. Normal range of motion.     Cervical back: Normal range of motion and neck supple.  Skin:    General: Skin is warm.  Neurological:     Mental Status: She is alert and oriented to person, place, and time.  Psychiatric:        Mood and Affect: Affect normal.      LABORATORY DATA:  I have reviewed the data as listed Lab Results  Component Value Date   WBC 9.3 07/23/2023   HGB 15.0 07/23/2023   HCT 45.8 07/23/2023   MCV 85.6 07/23/2023   PLT 227 07/23/2023   Recent Labs    06/11/23 0806 07/02/23 0933 07/23/23 0805  NA 140 142 136  K 3.6 3.8 3.7  CL 107 107 106  CO2 25 25 24   GLUCOSE 103* 96 93  BUN 16 15 21   CREATININE 0.66 0.63 0.67  CALCIUM 9.5 9.6 9.4  GFRNONAA >60 >60 >60  PROT 6.5  7.0 7.0  ALBUMIN 3.9 3.9 4.0  AST 20 22 24   ALT 13 18 16   ALKPHOS 83 81 87  BILITOT 0.6 0.7 0.7    RADIOGRAPHIC STUDIES: I have personally reviewed the radiological images as listed and agreed with the findings in the report. No results found.   ASSESSMENT & PLAN:   Primary cancer of left lower lobe of lung (HCC) #JUNE 2023-STAGE IV--recurrent/metastatic disease with New left adrenal metastasis;  PET scan JUNE 20th- 2023-  Enlarged 4 cm hypermetabolic LEFT adrenal metastasis;  Suspicion of metastatic adenopathy to the LEFT hilum.  Part solid  nodule in the RIGHT lower lobe without metabolic activity. 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 Currently on single agent Keytruda [PD-L1 greater than 80% ].  Currently on single agent Keytruda.   # # CT scan- June 13th, 2024- Status post left lower lobectomy. No evidence of recurrent or metastatic disease in the chest; Unchanged small ground-glass nodules in the right lower lobe, including a 1.0 x 0.8 cm nodule in the dependent right lower lobe- stable.   # Proceed of Keytruda single agent. Labs today reviewed;  acceptable for treatment today. Tolerating well except for mild chills. TSH- MAY 2024- WNL.stable.  # Right mildly complex cystic adnexal mass noted-June 13th, 2024- Significant interval enlargement of a multiseptated cystic lesion of the right ovary, now measuring 11.7 x 8.2 cm, previously 8.1 x 5.8 cm. 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  S/p  cardiac /pulmonary- PFTs/ stress test- stable  # mild Hypokalemia: discussed dietary supplement. stable  # Chronic pain: headaches/cervical pain/back pain [Dr.Naveira; pain doctor]  On NSAIDs [ monitor for now. OCT 2023- MRI brain- NEGATIVE for any metastatic disease.   Stable.  FEB 2024- MRI lumbar spine- degenerative disease- on ibuprofen prn- BID. stable  # Left rib pain-Post throacotomy pain/tingling and numbness:  Continue gabapentin -300 mg TID; and then at extra at night prn. stable  # CAD/ PVD- cramping- s/p evaluation [Dr.Schneir] dec 2023 stable  # COPD-stable encouraged continue to avoid smoking; again counseled to quit smoking; s/p pulmonary; awaiting PFTs-stable  # IV Access :s/p  port placement-stable  *AM appts- Monday-   # DISPOSITION:  # keytruda today; # Follow-up in 3  weeks-MD labs/port- cbc/cmp;TSH; [id number] -  Dr. Fabio Neighbors, MD 07/23/2023 9:49 AM

## 2023-07-23 NOTE — Progress Notes (Signed)
Patient says that she is stressed due to her lower abdomin mass. Plus all the run around going to different doctors and tests.She is still feeling about the same. Fatigue, shortness of breath sometimes, and her ankles are still bothering her.

## 2023-07-23 NOTE — Assessment & Plan Note (Addendum)
#  JUNE 2023-STAGE IV--recurrent/metastatic disease with New left adrenal metastasis;  PET scan JUNE 20th- 2023-  Enlarged 4 cm hypermetabolic LEFT adrenal metastasis;  Suspicion of metastatic adenopathy to the LEFT hilum.  Part solid nodule in the RIGHT lower lobe without metabolic activity. 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 Currently on single agent Keytruda [PD-L1 greater than 80% ].  Currently on single agent Keytruda.   # # CT scan- June 13th, 2024- Status post left lower lobectomy. No evidence of recurrent or metastatic disease in the chest; Unchanged small ground-glass nodules in the right lower lobe, including a 1.0 x 0.8 cm nodule in the dependent right lower lobe- stable.   # Proceed of Keytruda single agent. Labs today reviewed;  acceptable for treatment today. Tolerating well except for mild chills. TSH- MAY 2024- WNL.stable.  # Right mildly complex cystic adnexal mass noted-June 13th, 2024- Significant interval enlargement of a multiseptated cystic lesion of the right ovary, now measuring 11.7 x 8.2 cm, previously 8.1 x 5.8 cm. 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  S/p  cardiac /pulmonary- PFTs/ stress test- stable  # mild Hypokalemia: discussed dietary supplement. stable  # Chronic pain: headaches/cervical pain/back pain [Dr.Naveira; pain doctor]  On NSAIDs [ monitor for now. OCT 2023- MRI brain- NEGATIVE for any metastatic disease.   Stable.  FEB 2024- MRI lumbar spine- degenerative disease- on ibuprofen prn- BID. stable  # Left rib pain-Post throacotomy pain/tingling and numbness:  Continue gabapentin -300 mg TID; and then at extra at night prn. stable  # CAD/ PVD- cramping- s/p evaluation [Dr.Schneir] dec 2023 stable  # COPD-stable encouraged continue to avoid smoking; again counseled to quit smoking; s/p pulmonary; awaiting PFTs-stable  # IV Access  :s/p  port placement-stable  *AM appts- Monday-   # DISPOSITION:  # keytruda today; # Follow-up in 3  weeks-MD labs/port- cbc/cmp;TSH; [id number] -  Dr. Leonard Schwartz

## 2023-07-24 DIAGNOSIS — I10 Essential (primary) hypertension: Secondary | ICD-10-CM | POA: Diagnosis not present

## 2023-07-24 DIAGNOSIS — E782 Mixed hyperlipidemia: Secondary | ICD-10-CM | POA: Diagnosis not present

## 2023-07-24 DIAGNOSIS — J439 Emphysema, unspecified: Secondary | ICD-10-CM | POA: Diagnosis not present

## 2023-07-24 DIAGNOSIS — I2510 Atherosclerotic heart disease of native coronary artery without angina pectoris: Secondary | ICD-10-CM | POA: Diagnosis not present

## 2023-07-24 DIAGNOSIS — I714 Abdominal aortic aneurysm, without rupture, unspecified: Secondary | ICD-10-CM | POA: Diagnosis not present

## 2023-07-24 DIAGNOSIS — Z720 Tobacco use: Secondary | ICD-10-CM | POA: Diagnosis not present

## 2023-07-24 DIAGNOSIS — D3911 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: Secondary | ICD-10-CM | POA: Diagnosis not present

## 2023-08-01 ENCOUNTER — Ambulatory Visit (INDEPENDENT_AMBULATORY_CARE_PROVIDER_SITE_OTHER): Payer: Medicare Other

## 2023-08-01 DIAGNOSIS — I70213 Atherosclerosis of native arteries of extremities with intermittent claudication, bilateral legs: Secondary | ICD-10-CM | POA: Diagnosis not present

## 2023-08-01 DIAGNOSIS — I6523 Occlusion and stenosis of bilateral carotid arteries: Secondary | ICD-10-CM | POA: Diagnosis not present

## 2023-08-07 NOTE — Progress Notes (Unsigned)
MRN : [id number]  Laura Nielsen is a 67 y.o. (03-14-1956) female who presents with chief complaint of check carotid arteries.  History of Present Illness:   The patient returns to the office for surveillance of an abdominal aortic aneurysm status post stent graft placement on 03/27/2013.   Patient denies abdominal pain or back pain, no other abdominal complaints. No groin related complaints. No symptoms consistent with distal embolization No changes in claudication distance.   Patient denies interval amaurosis fugax or TIA symptoms. There is no history of worsening leg pain or claudication symptoms.  She rest pain symptoms of the lower extremities.    The patient denies angina or shortness of breath.    In May 2022 she had a lobectomy for lung CA.  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  She also has an ovarian cyst which has been increasing in size.  She was seen by a GYN oncologist but had a very poor interaction with him and is asking about a second opinion.   Duplex ultrasound shows a patent stent graft no endoleak and the sac is 2.81 cm; previous study measured 2.8 cm.   Carotid duplex shows RICA 60-79% and LICA 1-39%  stenosis (increase on the right)   ABI Rt=1.08 and Lt=1.09 triphasic signals bilaterally.  (Previous ABI Rt=1.11 and Lt=1.12 triphasic signals bilaterally)  No outpatient medications have been marked as taking for the 08/09/23 encounter (Appointment) with Gilda Crease, Latina Craver, MD.    Past Medical History:  Diagnosis Date   AAA (abdominal aortic aneurysm) (HCC)    s/p EVAR AAA 03/27/13   Arthritis    Asthma    Cancer (HCC)    Centrilobular emphysema (HCC)    Complication of anesthesia    BP drops after   Coronary artery disease    History of kidney stones    Osteoporosis    Primary cancer of left lower lobe of lung (HCC)    Sleep apnea     Past Surgical History:  Procedure  Laterality Date   ABDOMINAL AORTIC ENDOVASCULAR STENT GRAFT  03/27/2013   Dr. Levora Dredge   Cardiac catheterization  02/2009   70-80% stenosis RCA stent placed. started on Plavix   CARDIAC CATHETERIZATION     CATARACT EXTRACTION W/PHACO Left 05/14/2023   Procedure: CATARACT EXTRACTION PHACO AND INTRAOCULAR LENS PLACEMENT (IOC) LEFT DIABETIC  OMIDRIA  19.40  01:34.8;  Surgeon: Estanislado Pandy, MD;  Location: [facility];  Service: Ophthalmology;  Laterality: Left;   CATARACT EXTRACTION W/PHACO Right 06/27/2023   Procedure: CATARACT EXTRACTION PHACO AND INTRAOCULAR LENS PLACEMENT (IOC) RIGHT DIABETIC  6.47  00:42.6;  Surgeon: Estanislado Pandy, MD;  Location: [facility];  Service: Ophthalmology;  Laterality: Right;   COLONOSCOPY WITH PROPOFOL N/A 01/05/2021   Procedure: COLONOSCOPY WITH PROPOFOL;  Surgeon: Pasty Spillers, MD;  Location: [facility];  Service: Endoscopy;  Laterality: N/A;   CORONARY ANGIOPLASTY     stent placed   ESOPHAGOGASTRODUODENOSCOPY (EGD) WITH PROPOFOL N/A 01/05/2021   Procedure: ESOPHAGOGASTRODUODENOSCOPY (EGD) WITH PROPOFOL;  Surgeon: Pasty Spillers, MD;  Location: [facility];  Service: Endoscopy;  Laterality: N/A;   INTERCOSTAL NERVE BLOCK Left 04/06/2021   Procedure: INTERCOSTAL NERVE BLOCK;  Surgeon: Loreli Slot, MD;  Location: [facility] OR;  Service: Thoracic;  Laterality: Left;  IR IMAGING GUIDED PORT INSERTION  06/20/2022   KIDNEY STONE SURGERY  1999   LUNG LOBECTOMY Left 04/06/2021   robotic left lower lobectomy 04/06/2021 Dr. Dorris Fetch for Stage 1A adenocarcinoma   NODE DISSECTION Left 04/06/2021   Procedure: NODE DISSECTION;  Surgeon: Loreli Slot, MD;  Location: [facility] OR;  Service: Thoracic;  Laterality: Left;   TUBAL LIGATION     VAGINAL HYSTERECTOMY     Menometrorrhagia. Excessive bleeding. Unknown if cervix removed.     Social History Social History   Tobacco Use   Smoking status: Every Day     Current packs/day: 1.00    Average packs/day: 0.7 packs/day for 96.2 years (62.8 ttl pk-yrs)    Types: Cigarettes    Start date: 1973   Smokeless tobacco: Never   Tobacco comments:    12/23/19 states she quit for 9 months and then started back.   Vaping Use   Vaping status: Never Used  Substance Use Topics   Alcohol use: Yes    Comment: rarely - once a year   Drug use: No    Family History Family History  Problem Relation Age of Onset   Hypertension Mother    Coronary artery disease Mother    Heart attack Mother        acute   Cancer Mother    Alcohol abuse Father    Depression Father    Hypertension Father    Heart attack Father 12       acute   Alcohol abuse Sister    Hyperlipidemia Sister    Hypertension Sister    Cancer Sister 53   Heart attack Sister        x's 2   Coronary artery disease Sister 94       x's 2   Breast cancer Sister 28    Allergies  Allergen Reactions   Atorvastatin Other (See Comments)    Elevated blood sugar    Omeprazole Nausea And Vomiting   Bupropion Nausea Only    Only on the 150mg  tablets, but the 100mg  didn't help with smoking     REVIEW OF SYSTEMS (Negative unless checked)  Constitutional: [] Weight loss  [] Fever  [] Chills Cardiac: [] Chest pain   [] Chest pressure   [] Palpitations   [] Shortness of breath when laying flat   [] Shortness of breath with exertion. Vascular:  [x] Pain in legs with walking   [] Pain in legs at rest  [] History of DVT   [] Phlebitis   [] Swelling in legs   [] Varicose veins   [] Non-healing ulcers Pulmonary:   [] Uses home oxygen   [] Productive cough   [] Hemoptysis   [] Wheeze  [] COPD   [] Asthma Neurologic:  [] Dizziness   [] Seizures   [] History of stroke   [] History of TIA  [] Aphasia   [] Vissual changes   [] Weakness or numbness in arm   [] Weakness or numbness in leg Musculoskeletal:   [] Joint swelling   [] Joint pain   [] Low back pain Hematologic:  [] Easy bruising  [] Easy bleeding   [] Hypercoagulable state    [] Anemic Gastrointestinal:  [] Diarrhea   [] Vomiting  [] Gastroesophageal reflux/heartburn   [] Difficulty swallowing. Genitourinary:  [] Chronic kidney disease   [] Difficult urination  [] Frequent urination   [] Blood in urine Skin:  [] Rashes   [] Ulcers  Psychological:  [] History of anxiety   []  History of major depression.  Physical Examination  There were no vitals filed for this visit. There is no height or weight on file to calculate BMI. Gen: WD/WN, NAD Head: [place]/AT,  No temporalis wasting.  Ear/Nose/Throat: Hearing grossly intact, nares w/o erythema or drainage Eyes: PER, EOMI, sclera nonicteric.  Neck: Supple, no masses.  No bruit or JVD.  Pulmonary:  Good air movement, no audible wheezing, no use of accessory muscles.  Cardiac: RRR, normal S1, S2, no Murmurs. Vascular:  carotid bruit noted Vessel Right Left  Radial Palpable Palpable  Carotid  Palpable  Palpable  Subclav  Palpable Palpable  Gastrointestinal: soft, non-distended. No guarding/no peritoneal signs.  Musculoskeletal: M/S 5/5 throughout.  No visible deformity.  Neurologic: CN 2-12 intact. Pain and light touch intact in extremities.  Symmetrical.  Speech is fluent. Motor exam as listed above. Psychiatric: Judgment intact, Mood & affect appropriate for pt's clinical situation. Dermatologic: No rashes or ulcers noted.  No changes consistent with cellulitis.   CBC Lab Results  Component Value Date   WBC 9.3 07/23/2023   HGB 15.0 07/23/2023   HCT 45.8 07/23/2023   MCV 85.6 07/23/2023   PLT 227 07/23/2023    BMET    Component Value Date/Time   NA 136 07/23/2023 0805   NA 142 02/13/2022 1027   NA 139 03/28/2013 0354   K 3.7 07/23/2023 0805   K 3.7 03/28/2013 1811   CL 106 07/23/2023 0805   CL 107 03/28/2013 0354   CO2 24 07/23/2023 0805   CO2 27 03/28/2013 0354   GLUCOSE 93 07/23/2023 0805   GLUCOSE 112 (H) 03/28/2013 0354   BUN 21 07/23/2023 0805   BUN 15 02/13/2022 1027   BUN 8 03/28/2013 0354    CREATININE 0.67 07/23/2023 0805   CREATININE 0.64 03/05/2023 0947   CREATININE 0.60 08/30/2017 0920   CALCIUM 9.4 07/23/2023 0805   CALCIUM 8.2 (L) 03/28/2013 0354   GFRNONAA >60 07/23/2023 0805   GFRNONAA >60 03/05/2023 0947   GFRNONAA 99 08/30/2017 0920   GFRAA 93 01/25/2021 1048   GFRAA 115 08/30/2017 0920   CrCl cannot be calculated (Unknown ideal weight.).  COAG Lab Results  Component Value Date   INR 1.0 06/20/2022   INR 1.0 07/12/2021   INR 1.0 04/04/2021    Radiology No results found.   Assessment/Plan There are no diagnoses linked to this encounter.   Levora Dredge, MD  08/07/2023 11:51 AM

## 2023-08-08 ENCOUNTER — Inpatient Hospital Stay: Payer: Medicare Other | Attending: Internal Medicine | Admitting: Obstetrics and Gynecology

## 2023-08-08 ENCOUNTER — Encounter: Payer: Self-pay | Admitting: Obstetrics and Gynecology

## 2023-08-08 VITALS — BP 130/89 | HR 70 | Temp 97.6°F | Resp 20 | Wt 201.9 lb

## 2023-08-08 DIAGNOSIS — Z85118 Personal history of other malignant neoplasm of bronchus and lung: Secondary | ICD-10-CM | POA: Insufficient documentation

## 2023-08-08 DIAGNOSIS — D3911 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: Secondary | ICD-10-CM | POA: Insufficient documentation

## 2023-08-08 DIAGNOSIS — C7972 Secondary malignant neoplasm of left adrenal gland: Secondary | ICD-10-CM | POA: Diagnosis not present

## 2023-08-08 DIAGNOSIS — N9489 Other specified conditions associated with female genital organs and menstrual cycle: Secondary | ICD-10-CM

## 2023-08-08 DIAGNOSIS — F17210 Nicotine dependence, cigarettes, uncomplicated: Secondary | ICD-10-CM | POA: Diagnosis not present

## 2023-08-08 DIAGNOSIS — J449 Chronic obstructive pulmonary disease, unspecified: Secondary | ICD-10-CM | POA: Insufficient documentation

## 2023-08-08 DIAGNOSIS — Z902 Acquired absence of lung [part of]: Secondary | ICD-10-CM | POA: Diagnosis not present

## 2023-08-08 DIAGNOSIS — Z90710 Acquired absence of both cervix and uterus: Secondary | ICD-10-CM | POA: Diagnosis not present

## 2023-08-08 DIAGNOSIS — Z79899 Other long term (current) drug therapy: Secondary | ICD-10-CM | POA: Diagnosis not present

## 2023-08-08 DIAGNOSIS — Z7189 Other specified counseling: Secondary | ICD-10-CM

## 2023-08-08 DIAGNOSIS — Z5112 Encounter for antineoplastic immunotherapy: Secondary | ICD-10-CM | POA: Diagnosis not present

## 2023-08-08 NOTE — Progress Notes (Signed)
Gynecologic Oncology Interval Visit   Referring Provider: Dr. Tiburcio Pea  Chief Complaint: Incidental Right Sided Complex Cystic Mass Subjective:  Laura Nielsen is a 67 y.o. G2P2 female s/p vaginal hysterectomy  for bleeding over 30 years ago (bilateral ovaries in situ) and history COPD, lung cancer s/p pulmonary lobectomy who is seen in consultation from Dr. Tiburcio Pea for incidental right sided complex cystic mass. She was seen by Dr. Johnnette Litter in October 2021 and felt to be low risk of ovarian malignancy.   She presents today for follow up. She had her tumor markers; seen cardiology and pulmonology for surgical clearance; and she would like to proceed with surgery. She is asymptomatic.   CA125 = 15.8  HER4 = 123 (prior results 104.0)  Cardiology: Dr. Darrold Junker Patient being evaluated for surgical removal of right ovarian mass. 2D echocardiogram 06/29/2023 revealed normal left ventricular function, with LVEF > 55%. Lexiscan Myoview 07/16/2023 revealed LVEF 70% with mild apical wall ischemia. In the absence of chest pain with mildly abnormal Lexiscan Myoview, the patient should be at low and acceptable cardiovascular risk for surgery.  1. Continue current medications 2. Counseled patient about low-sodium diet 3. DASH diet printed instructions given to patient 4. Counseled patient about low-cholesterol diet 5. Continue rosuvastatin for hyperlipidemia management  6. Low-fat and cholesterol diet printed instructions given to the patient 7. Advised patient to stop smoking 8. Proceed with surgery as deemed necessary 9. Continue bisoprolol pre-, peri and postoperatively 10. Return to clinic for follow-up in 3 months     She saw Dr. Aundria Rud for preoperative assessment. She had PFT's in 2022 that showed a preserved ratio of 70% with impaired spirometry consistent with PRISM in light of her smoking history. Her max eosinophil count was elevated at 600. He started her on triple therapy with ICS/LABA/LAMA  (briztri) and assess her clinical response on follow up. Patient's imaging was reviewed and there is no sign of pembrolizumab associated lung toxicity noted. At the time of her surgery the total amount of lung resected was 18 cm. She continues to smoke. Counseled patient at length regarding smoking cessation. She is not ready to quit smoking yet. 18 points on Airscat (low risk), 4 points on Arozullah (low risk). He recommended post procedural CPAP should the patient be somnolent post op, and she could benefit from post op nebulizers. PFTs were also obtained.   PFTs     Gynecologic History:  Laura Nielsen is a pleasant female s/p hysterectomy for bleeding over 30 years ago (bilateral ovaries in situ) who is seen in consultation incidental right sided 6 cm complex cystic mass.   She underwent Low dose CT/Lung Cancer Screening in 2021 which showed volume derived mean 9.8 mm nodule in the left lower lobe, increased in size. 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  No symptoms.   In view of incidental finding of ovarian mass and [place] was done Ultrasound: Right ovary: 6.9 x 5.2 x 5.6 normal appearing. Complex mass with anechoic fluid and thick septations in the right ovary. No blood flow seen. Measures 6.33 x 5.16 x 4.62 cm. Left ovary not visualized. No free fluid. Uterus and cervix surgically removed.   CA 125 - 8.9 HE4- 94.6 Post menopausal ROMA 1.47 = 14% (normal)  Patient felt to be high risk for surgery in view of pulmonary  issues and BMI and Dr. Tiburcio Pea referred her to gynecologic oncology for evaluation.   Surveillance recommended in view of benign characteristics of mass.    She was released to follow up with Dr. Tiburcio Pea and last saw him in July 2022. Repeat ultrasound and ca 125 was recommended at that  time.   01/25/22  US pelvis for surveillance FINDINGS:  - UTERUS/CERVIX: Status post hysterectomy.  - OVARIES: The ovaries were seen well transvaginally. 5.3 x 4.2 x 5.1 cm cystic lesion with multiple mildly thickened septations noted in the right ovary, previously measured 4.8 x 4.0 x 5.3 cm. No mural nodularity, or papillary projections appreciated. Mild peripheral vascularity. Appropriate arterial inflow and venous outflow of the ovaries was documented on color and spectral Doppler imaging. The right ovary measured 5.8 x 5.6 x 5.2 cm. - OTHER: No abnormal pelvic free fluid. 5.3 cm cystic lesion with thickened septations, as above, minimally increased in size compared with 05/25/2021.  O-RADS ultrasound category: 3: Low Risk (1  - <10%).   May 07 2022-  CT scan highly concerning for recurrent/metastatic disease with-. New left adrenal metastasis;  Ground-glass and part solid nodules in the peripheral right lower lobe, unchanged from 11/07/2021 but slightly enlarged from 01/01/2018. Findings are suspicious for indolent adenocarcinoma.  F One- PFL1 =80%; KRAS G12C PET scan JUNE 20th- 2023-  Enlarged 4 cm hypermetabolic LEFT adrenal metastasis;  Suspicion of metastatic adenopathy to the LEFT hilum.  Part solid nodule in the RIGHT lower lobe without metabolic activity.    ADRENAL BIOPSY- CK7 POSITIVE ADENOCARCINOMA- There is limited tissue remaining for ancillary testing, not likely  sufficient for NGS testing.    Jul 10 2022 - single agent Keytruda with excellent response  May 17 2023  CT CHEST, ABDOMEN, AND PELVIS   Lungs/Pleura: Status post left lower lobectomy. Diffuse bilateral bronchial wall thickening. Mild centrilobular and paraseptal emphysema. Unchanged small ground-glass nodules in the right lower lobe, including a 1.0 x 0.8 cm nodule in the dependent right lower lobe (series 4, image 100). Background of very fine centrilobular nodularity, most concentrated in the lung apices. No  pleural effusion or pneumothorax.   No enlarged abdominal or pelvic lymph nodes.   Reproductive: Significant interval enlargement of a multi septated cystic lesion of the right ovary, now measuring 11.7 x 8.2 cm, previously 8.1 x 5.8 cm (series 2, image 103). Normal left ovary. Status post hysterectomy.    IMPRESSION: 1. Status post left lower lobectomy. No evidence of recurrent or metastatic disease in the chest. 2. Unchanged small ground-glass nodules in the right lower lobe, including a 1.0 x 0.8 cm nodule in the dependent right lower lobe. These remain nonspecific, possibly infectious or inflammatory although small metachronous adenocarcinoma not excluded. Attention on follow-up. 3. Left adrenal metastasis remains resolved following treatment.  4. Significant interval enlargement of a multiseptated cystic lesion of the right ovary, now measuring 11.7 x 8.2 cm, previously 8.1 x 5.8 cm. 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 5. Emphysema and diffuse bilateral bronchial wall thickening. Background of very fine centrilobular nodularity, consistent with smoking-related respiratory bronchiolitis. 6. Aortobiiliac stent endograft repair. 7. Coronary artery disease.    Lung Cancer History Expand All Collapse All   Oncology History Overview Note   #MAY 2022- LLL nodule 2.3 cm Adenocarcinoma Stage IA; s/p lobectomy.  No adjuvant therapy  LUNG LOBECTOMY Left 04/06/2021     robotic left lower lobectomy 04/06/2021 Dr. Dorris Fetch for Stage 1A adenocarcinoma   NODE DISSECTION Left 04/06/2021  Procedure: NODE DISSECTION;  Surgeon: Loreli Slot, MD;  Location: [facility] OR;  Service: Thoracic;  Laterality: Left;     # CT scan 4th June 2023-highly concerning for recurrent/metastatic disease with-. New left adrenal metastasis;  Ground-glass and part solid nodules in the peripheral right lower lobe, unchanged from 11/07/2021 but slightly enlarged from 01/01/2018. Findings are  suspicious for indolent adenocarcinoma.  F One- PFL1 =80%; KRAS G12C PET scan JUNE 20th- 2023-  Enlarged 4 cm hypermetabolic LEFT adrenal metastasis;  Suspicion of metastatic adenopathy to the LEFT hilum.  Part solid nodule in the RIGHT lower lobe without metabolic activity.    3 ADRENAL BIOPSY- CK7 POSITIVE ADENOCARCINOMA- There is limited tissue remaining for ancillary testing, not likely  sufficient for NGS testing.    # AUG 7th, 2023-single agent Keytruda.          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  08/31/2020   Fatty liver 08/09/2020   Recurrent major depressive disorder, in partial remission (HCC) 04/20/2020   Aortic atherosclerosis (HCC) 07/17/2019   Abnormal MRI, lumbar spine (03/28/2017) 07/04/2017   Abnormal MRI, cervical spine (03/28/2017) 07/04/2017   Lumbar facet syndrome (Bilateral) (L>R) 04/26/2017   Lumbar facet hypertrophy (multilevel) (Bilateral) 04/26/2017   Neurogenic pain 04/26/2017   Musculoskeletal pain 04/26/2017   Chronic pain syndrome 03/08/2017   Chronic low back pain (1ry area of Pain) (Bilateral) (L>R) w/o sciatica 03/08/2017   Chronic neck pain (2ry area of Pain) (Bilateral) (R>L) 03/08/2017   Cervicogenic headache (Right) 03/08/2017   Chronic upper back pain (3ry area of Pain) (Bilateral) (R>L) 03/08/2017   Acute right hip pain 03/08/2017   Osteoarthritis of hip (Bilateral) (L>R) 03/08/2017   Cervical central spinal  stenosis 03/08/2017   Cervical spondylosis with radiculopathy (Right) (C5) 03/08/2017   Lumbar spondylosis 03/08/2017   Atherosclerosis of native arteries of extremity with intermittent claudication (HCC) 02/22/2017   Gout 02/16/2016   Arthritis 05/14/2015   Carotid artery narrowing 05/14/2015   Claudication (HCC) 05/14/2015   CAFL (chronic airflow limitation) (HCC) 05/14/2015   Clinical depression 05/14/2015   Emphysema lung (HCC) 05/14/2015   Acid reflux 05/14/2015   Urinary incontinence 05/14/2015   Pins and needles sensation 05/14/2015   Obstructive apnea 05/14/2015   History of abnormal cervical Papanicolaou smear 05/14/2015   Peripheral blood vessel disorder (HCC) 05/14/2015   B12 deficiency 05/14/2015   Vitamin D insufficiency 05/14/2015   S/P AAA repair 05/26/2014   Aortic heart valve narrowing 01/31/2013   Malaise and fatigue 09/26/2012   CAD in native artery 03/17/2009   Diabetes mellitus, type 2 (HCC) 02/05/2009   Hypercholesteremia 06/24/2008   Allergic rhinitis 10/25/2007   Airway hyperreactivity 10/25/2007   Smoking greater than 30 pack years 10/25/2007   Narrowing of intervertebral disc space 10/25/2007   Primary hypertension 10/25/2007   Arthritis, degenerative 10/25/2007    Past Medical History: Past Medical History:  Diagnosis Date   AAA (abdominal aortic aneurysm) (HCC)    s/p EVAR AAA  03/27/13   Arthritis    Asthma    Cancer (HCC)    Centrilobular emphysema (HCC)    Complication of anesthesia    BP drops after   Coronary artery disease    History of kidney stones    Osteoporosis    Primary cancer of left lower lobe of lung (HCC)    Sleep apnea     Past Surgical History: Past Surgical History:  Procedure Laterality Date   ABDOMINAL AORTIC ENDOVASCULAR STENT GRAFT  03/27/2013   Dr. Levora Dredge   Cardiac catheterization  02/2009   70-80% stenosis RCA stent placed. started on Plavix   CARDIAC CATHETERIZATION     CATARACT EXTRACTION W/PHACO  Left 05/14/2023   Procedure: CATARACT EXTRACTION PHACO AND INTRAOCULAR LENS PLACEMENT (IOC) LEFT DIABETIC  OMIDRIA  19.40  01:34.8;  Surgeon: Estanislado Pandy, MD;  Location: [facility];  Service: Ophthalmology;  Laterality: Left;   CATARACT EXTRACTION W/PHACO Right 06/27/2023   Procedure: CATARACT EXTRACTION PHACO AND INTRAOCULAR LENS PLACEMENT (IOC) RIGHT DIABETIC  6.47  00:42.6;  Surgeon: Estanislado Pandy, MD;  [facility];  Service: Ophthalmology;  Laterality: Right;   COLONOSCOPY WITH PROPOFOL N/A 01/05/2021   Procedure: COLONOSCOPY WITH PROPOFOL;  Surgeon: Pasty Spillers, MD;  Location: [facility] ENDOSCOPY;  Service: Endoscopy;  Laterality: N/A;   CORONARY ANGIOPLASTY     stent placed   ESOPHAGOGASTRODUODENOSCOPY (EGD) WITH PROPOFOL N/A 01/05/2021   Procedure: ESOPHAGOGASTRODUODENOSCOPY (EGD) WITH PROPOFOL;  Surgeon: Pasty Spillers, MD;  Location: [facility] ENDOSCOPY;  Service: Endoscopy;  Laterality: N/A;   INTERCOSTAL NERVE BLOCK Left 04/06/2021   Procedure: INTERCOSTAL NERVE BLOCK;  Surgeon: Loreli Slot, MD;  Location: [facility] OR;  Service: Thoracic;  Laterality: Left;   IR IMAGING GUIDED PORT INSERTION  06/20/2022   KIDNEY STONE SURGERY  1999   LUNG LOBECTOMY Left 04/06/2021   robotic left lower lobectomy 04/06/2021 Dr. Dorris Fetch for Stage 1A adenocarcinoma   NODE DISSECTION Left 04/06/2021   Procedure: NODE DISSECTION;  Surgeon: Loreli Slot, MD;  Location: [facility] OR;  Service: Thoracic;  Laterality: Left;   TUBAL LIGATION     VAGINAL HYSTERECTOMY     Menometrorrhagia. Excessive bleeding. Unknown if cervix removed.      OB History:  OB History  Gravida Para Term Preterm AB Living  2 2          SAB IAB Ectopic Multiple Live Births               # Outcome Date GA Lbr Len/2nd Weight Sex Type Anes PTL Lv  2 Para           1 Para             Family History: Family History  Problem Relation Age of Onset   Hypertension Mother     Coronary artery disease Mother    Heart attack Mother        acute   Cancer Mother    Alcohol abuse Father    Depression Father    Hypertension Father    Heart attack Father 21       acute   Alcohol abuse Sister    Hyperlipidemia Sister    Hypertension Sister    Cancer Sister 80   Heart attack Sister        x's 2   Coronary artery disease Sister 84       x's 2   Breast cancer Sister 50    Social History:  Social History   Socioeconomic History   Marital status: Divorced    Spouse name: Not on file   Number of children: 2   Years of education: H/S   Highest education level: High school graduate  Occupational History   Occupation: Disabled   Occupation: retired  Tobacco Use   Smoking status: Every Day    Current packs/day: 1.00    Average packs/day: 0.7 packs/day for 96.2 years (62.8 ttl pk-yrs)    Types: Cigarettes    Start date: 1973   Smokeless tobacco: Never   Tobacco comments:    12/23/19 states she quit for 9 months and then started back.   Vaping Use   Vaping status: Never Used  Substance and Sexual Activity   Alcohol use: Yes    Comment: rarely - once a year   Drug use: No   Sexual activity: Not on file  Other Topics Concern   Not on file  Social History Narrative   Hx of smoking; quit prior to lung surgery then started back. Lives in [place] alone. Used to work in [employer], [employer]- Facilities manager. On disability sec to spinal pain.    Social Determinants of Health   Financial Resource Strain: Medium Risk (02/16/2023)   Overall Financial Resource Strain (CARDIA)    Difficulty of Paying Living Expenses: Somewhat hard  Food Insecurity: Patient Declined (02/16/2023)   Hunger Vital Sign    Worried About Running Out of Food in the Last Year: Patient declined    Ran Out of Food in the Last Year: Patient declined  Transportation Needs: No Transportation Needs (02/16/2023)   PRAPARE - Administrator, Civil Service (Medical): No    Lack of Transportation  (Non-Medical): No  Physical Activity: Unknown (02/16/2023)   Exercise Vital Sign    Days of Exercise per Week: 0 days    Minutes of Exercise per Session: Patient declined  Stress: Stress Concern Present (02/16/2023)   [employer] of Occupational Health - Occupational Stress Questionnaire    Feeling of Stress : Rather much  Social Connections: Unknown (02/16/2023)   Social Connection and Isolation Panel [NHANES]    Frequency of Communication with Friends and Family: More than three times a week    Frequency of Social Gatherings with Friends and Family: Once a week    Attends Religious Services: Not on Insurance claims handler of Clubs or Organizations: No    Attends Banker Meetings: Never    Marital Status: Patient declined  Intimate Partner Violence: Not At Risk (02/20/2023)   Humiliation, Afraid, Rape, and Kick questionnaire    Fear of Current or Ex-Partner: No    Emotionally Abused: No    Physically Abused: No    Sexually Abused: No    Allergies: Allergies  Allergen Reactions   Atorvastatin Other (See Comments)    Elevated blood sugar    Omeprazole Nausea And Vomiting   Bupropion Nausea Only    Only on the 150mg  tablets, but the 100mg  didn't help with smoking    Current Medications: Current Outpatient Medications  Medication Sig Dispense Refill   allopurinol (ZYLOPRIM) 100 MG tablet Take 1 tablet (100 mg total) by mouth daily. 90 tablet 3   aspirin 81 MG tablet Take 81 mg by mouth daily.      bisoprolol (ZEBETA) 10 MG tablet Take 1 tablet (10 mg total) by mouth daily. 90 tablet 1   Budeson-Glycopyrrol-Formoterol (BREZTRI AEROSPHERE) 160-9-4.8 MCG/ACT AERO Inhale 2 puffs into the lungs  in the morning and at bedtime. 1284 g 11   Cholecalciferol 25 MCG (1000 UT) capsule Take 1,000 Units by mouth daily.     clopidogrel (PLAVIX) 75 MG tablet Take 1 tablet (75 mg total) by mouth daily. 90 tablet 4   diazepam (VALIUM) 5 MG tablet Take 1 tablet (5 mg total) by mouth  every 12 (twelve) hours as needed. 30 tablet 0   HYDROcodone-acetaminophen (NORCO/VICODIN) 5-325 MG tablet Take 1-2 tablets by mouth every 6 (six) hours as needed for moderate pain. 20 tablet 0   ibuprofen (ADVIL) 200 MG tablet Take 400 mg by mouth 2 (two) times daily as needed (headaches).     lidocaine-prilocaine (EMLA) cream Apply on the port. 30 -45 min  prior to port access. 30 g 3   lisinopril-hydrochlorothiazide (ZESTORETIC) 20-12.5 MG tablet Take 1 tablet by mouth daily. 90 tablet 1   pantoprazole (PROTONIX) 20 MG tablet Take 1 tablet (20 mg total) by mouth daily. 90 tablet 1   PROAIR HFA 108 (90 Base) MCG/ACT inhaler Inhale 2 puffs into the lungs every 6 (six) hours as needed for wheezing or shortness of breath. 8.5 g 4   rosuvastatin (CRESTOR) 20 MG tablet Take 1 tablet (20 mg total) by mouth daily. 90 tablet 1   Semaglutide, 1 MG/DOSE, (OZEMPIC, 1 MG/DOSE,) 4 MG/3ML SOPN Inject 1 mg into the skin once a week. 9 mL 1   fluticasone (FLONASE) 50 MCG/ACT nasal spray Place into both nostrils daily. (Patient not taking: Reported on 06/19/2023)     No current facility-administered medications for this visit.   Facility-Administered Medications Ordered in Other Visits  Medication Dose Route Frequency Provider Last Rate Last Admin   heparin lock flush 100 UNIT/ML injection            Review of Systems General: fatigue and weakness  HEENT: no complaints  Lungs: very limited activity.  She is unable to walk for long distances or climb stairs due to back pain Cardiac: no complaints.  Limitations as noted above.  Cardiac: no complaints  GI: decreased appetite, n/v, constipation o/w no complaints  GU: no complaints  Musculoskeletal: back pain o/w no complaints  Extremities: no complaints  Skin: no complaints  Neuro: neuropathy  Endocrine: no complaints  Psych: feeling sad         Objective:  Physical Examination:  BP 130/89   Pulse 70   Temp 97.6 F (36.4 C)   Resp 20   Wt 201  lb 14.4 oz (91.6 kg)   SpO2 100%   BMI 30.70 kg/m     ECOG Performance Status: 2 - Symptomatic, <50% confined to bed  GENERAL: Patient is a elderly appearing female in no acute distress. She is able to ambulate independently but has an altered gait HEENT:  PERRL, neck supple with midline trachea.  LUNGS: Normal respiratory effort ABDOMEN:  Soft, nontender, nondistended.  No ascites.  No obvious masses.  EXTREMITIES:  No peripheral edema.   NEURO:  Nonfocal. Well oriented.  Appropriate affect.  Pelvic: Chaperoned by RN deferred from prior exam. 06/20/2023 EGBUS: no lesions Cervix: Surgically absent Vagina: no lesions, no discharge or bleeding Uterus: Surgically absent Adnexa: Positive for smooth mass of the vaginal cuff that is slightly tender to deep palpation.  Exam is limited by habitus.   Lab Review As per HPI  Radiologic Imaging: 05/17/2023  CT CHEST, ABDOMEN, AND PELVIS WITH CONTRAST   TECHNIQUE: Multidetector CT imaging of the chest, abdomen and pelvis was performed following  the standard protocol during bolus administration of intravenous contrast.   RADIATION DOSE REDUCTION: This exam was performed according to the departmental dose-optimization program which includes automated exposure control, adjustment of the mA and/or kV according to patient size and/or use of iterative reconstruction technique.   CONTRAST:  OMNIPAQUE IOHEXOL 300 MG/ML  SOLN   COMPARISON:  CT chest angiogram, 01/14/2023, CT chest abdomen pelvis, 12/20/2022   FINDINGS: CT CHEST FINDINGS   Cardiovascular: Right chest port catheter. Aortic atherosclerosis. Normal heart size. Three-vessel coronary artery calcifications. No pericardial effusion.   Mediastinum/Nodes: No enlarged mediastinal, hilar, or axillary lymph nodes. Thyroid gland, trachea, and esophagus demonstrate no significant findings.   Lungs/Pleura: Status post left lower lobectomy. Diffuse bilateral bronchial wall  thickening. Mild centrilobular and paraseptal emphysema. Unchanged small ground-glass nodules in the right lower lobe, including a 1.0 x 0.8 cm nodule in the dependent right lower lobe (series 4, image 100). Background of very fine centrilobular nodularity, most concentrated in the lung apices. No pleural effusion or pneumothorax.   Musculoskeletal: No chest wall abnormality. No acute osseous findings.   CT ABDOMEN PELVIS FINDINGS   Hepatobiliary: No solid liver abnormality is seen. No gallstones, gallbladder wall thickening, or biliary dilatation.   Pancreas: Unremarkable. No pancreatic ductal dilatation or surrounding inflammatory changes.   Spleen: Normal in size without significant abnormality.   Adrenals/Urinary Tract: No evident left adrenal mass or nodule (series 2, image 59). Normal right adrenal gland. Simple, benign right renal cortical cysts, for which no further follow-up or characterization is required. Kidneys are otherwise normal, without renal calculi, solid lesion, or hydronephrosis. Bladder is unremarkable.   Stomach/Bowel: Stomach is within normal limits. Status post appendectomy. No evidence of bowel wall thickening, distention, or inflammatory changes. Descending and sigmoid diverticulosis. Moderate burden of stool throughout the colon and rectum.   Vascular/Lymphatic: Severe aortic atherosclerosis. Aortobiiliac stent endograft repair. No enlarged abdominal or pelvic lymph nodes.   Reproductive: Significant interval enlargement of a multi septated cystic lesion of the right ovary, now measuring 11.7 x 8.2 cm, previously 8.1 x 5.8 cm (series 2, image 103). Normal left ovary. Status post hysterectomy.   Other: No abdominal wall hernia or abnormality. No ascites.   Musculoskeletal: No acute osseous findings.   IMPRESSION: 1. Status post left lower lobectomy. No evidence of recurrent or metastatic disease in the chest. 2. Unchanged small ground-glass  nodules in the right lower lobe, including a 1.0 x 0.8 cm nodule in the dependent right lower lobe. These remain nonspecific, possibly infectious or inflammatory although small metachronous adenocarcinoma not excluded. Attention on follow-up. 3. Left adrenal metastasis remains resolved following treatment. 4. Significant interval enlargement of a multiseptated cystic lesion of the right ovary, now measuring 11.7 x 8.2 cm, previously 8.1 x 5.8 cm. 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 5. Emphysema and diffuse bilateral bronchial wall thickening. Background of very fine centrilobular nodularity, consistent with smoking-related respiratory bronchiolitis. 6. Aortobiiliac stent endograft repair. 7. Coronary artery disease.    Assessment:  Janalynn Bacheller is a 66 y.o. female diagnosed with incidentally found enlarging 11 cm right adnexal cystic mass in 2021 on imaging done for follow up of lung nodule seen on lung cancer screening scan.   On prior imaging Adnexal mass is PET negative and US shows it to be multicystic with septations with prior normal CA125 but elevated HE4.  She is asymptomatic, but strongly desires surgical management. Cleared by cardiology and pulmonology.   Cardiology: Steffanie Dunn 07/16/2023 revealed LVEF 70% with mild  apical wall ischemia. In the absence of chest pain with mildly abnormal [facility], cardiology cleared patient as low and acceptable risk.  Prior hysterectomy for bleeding 35 years ago.   Resection of lung cancer 1/23. PFTs 2024 Airway obstruction and moderate/severe loss of diffusion capacity.   Medical co-morbidities complicating care: smoker, chronic back pain, Body mass index is 30.7 kg/m., aortic valve narrowing on Plavix , depression, fatty liver, CAD, and smoker  Plan:   Problem List Items Addressed This Visit       Other   Adnexal mass - Primary   Other Visit Diagnoses     Counseling and coordination of care           We  would like to proceed with surgery. We previously offered radiologic guided drainage with cytology.  She is very interested in surgery.  Cardiology and pulmonary  cleared her for surgery.    Recommended proceeding with surgery at [facility]. She is at high risk for medical complications and need to covert to laparotomy as she may not be able to tolerate trendelenburg positioning.   We will reach out to the [facility] team to schedule her preop visit, PSU, and OR date.   The patient's diagnosis, an outline of the further diagnostic and laboratory studies which will be required, the recommendation for surgery, and alternatives were discussed with her and her accompanying family members.  All questions were answered to their satisfaction. Loella Hickle Leta Jungling, MD

## 2023-08-09 ENCOUNTER — Encounter (INDEPENDENT_AMBULATORY_CARE_PROVIDER_SITE_OTHER): Payer: Self-pay | Admitting: Vascular Surgery

## 2023-08-09 ENCOUNTER — Ambulatory Visit (INDEPENDENT_AMBULATORY_CARE_PROVIDER_SITE_OTHER): Payer: Medicare Other | Admitting: Vascular Surgery

## 2023-08-09 VITALS — BP 117/81 | HR 71 | Resp 18 | Ht 68.0 in | Wt 201.0 lb

## 2023-08-09 DIAGNOSIS — I10 Essential (primary) hypertension: Secondary | ICD-10-CM | POA: Diagnosis not present

## 2023-08-09 DIAGNOSIS — I6523 Occlusion and stenosis of bilateral carotid arteries: Secondary | ICD-10-CM | POA: Diagnosis not present

## 2023-08-09 DIAGNOSIS — I7143 Infrarenal abdominal aortic aneurysm, without rupture: Secondary | ICD-10-CM | POA: Diagnosis not present

## 2023-08-09 DIAGNOSIS — R252 Cramp and spasm: Secondary | ICD-10-CM

## 2023-08-09 DIAGNOSIS — I2510 Atherosclerotic heart disease of native coronary artery without angina pectoris: Secondary | ICD-10-CM

## 2023-08-09 DIAGNOSIS — I70213 Atherosclerosis of native arteries of extremities with intermittent claudication, bilateral legs: Secondary | ICD-10-CM | POA: Diagnosis not present

## 2023-08-09 LAB — VAS US ABI WITH/WO TBI
Left ABI: 0.97
Right ABI: 1.08

## 2023-08-13 ENCOUNTER — Encounter: Payer: Self-pay | Admitting: Internal Medicine

## 2023-08-13 ENCOUNTER — Inpatient Hospital Stay (HOSPITAL_BASED_OUTPATIENT_CLINIC_OR_DEPARTMENT_OTHER): Payer: Medicare Other | Admitting: Internal Medicine

## 2023-08-13 ENCOUNTER — Inpatient Hospital Stay: Payer: Medicare Other

## 2023-08-13 VITALS — BP 110/72 | HR 69 | Temp 97.8°F | Ht 68.0 in | Wt 200.0 lb

## 2023-08-13 DIAGNOSIS — F17210 Nicotine dependence, cigarettes, uncomplicated: Secondary | ICD-10-CM | POA: Diagnosis not present

## 2023-08-13 DIAGNOSIS — Z85118 Personal history of other malignant neoplasm of bronchus and lung: Secondary | ICD-10-CM | POA: Diagnosis not present

## 2023-08-13 DIAGNOSIS — D3911 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: Secondary | ICD-10-CM | POA: Diagnosis not present

## 2023-08-13 DIAGNOSIS — C3432 Malignant neoplasm of lower lobe, left bronchus or lung: Secondary | ICD-10-CM

## 2023-08-13 DIAGNOSIS — E039 Hypothyroidism, unspecified: Secondary | ICD-10-CM

## 2023-08-13 DIAGNOSIS — Z902 Acquired absence of lung [part of]: Secondary | ICD-10-CM | POA: Diagnosis not present

## 2023-08-13 DIAGNOSIS — C7972 Secondary malignant neoplasm of left adrenal gland: Secondary | ICD-10-CM | POA: Diagnosis not present

## 2023-08-13 DIAGNOSIS — Z5112 Encounter for antineoplastic immunotherapy: Secondary | ICD-10-CM | POA: Diagnosis not present

## 2023-08-13 LAB — CBC WITH DIFFERENTIAL/PLATELET
Abs Immature Granulocytes: 0.02 10*3/uL (ref 0.00–0.07)
Basophils Absolute: 0.1 10*3/uL (ref 0.0–0.1)
Basophils Relative: 1 %
Eosinophils Absolute: 0.3 10*3/uL (ref 0.0–0.5)
Eosinophils Relative: 4 %
HCT: 45.3 % (ref 36.0–46.0)
Hemoglobin: 14.8 g/dL (ref 12.0–15.0)
Immature Granulocytes: 0 %
Lymphocytes Relative: 34 %
Lymphs Abs: 3.1 10*3/uL (ref 0.7–4.0)
MCH: 28 pg (ref 26.0–34.0)
MCHC: 32.7 g/dL (ref 30.0–36.0)
MCV: 85.8 fL (ref 80.0–100.0)
Monocytes Absolute: 0.6 10*3/uL (ref 0.1–1.0)
Monocytes Relative: 7 %
Neutro Abs: 5.1 10*3/uL (ref 1.7–7.7)
Neutrophils Relative %: 54 %
Platelets: 227 10*3/uL (ref 150–400)
RBC: 5.28 MIL/uL — ABNORMAL HIGH (ref 3.87–5.11)
RDW: 14.2 % (ref 11.5–15.5)
WBC: 9.2 10*3/uL (ref 4.0–10.5)
nRBC: 0 % (ref 0.0–0.2)

## 2023-08-13 LAB — COMPREHENSIVE METABOLIC PANEL
ALT: 17 U/L (ref 0–44)
AST: 21 U/L (ref 15–41)
Albumin: 4 g/dL (ref 3.5–5.0)
Alkaline Phosphatase: 86 U/L (ref 38–126)
Anion gap: 7 (ref 5–15)
BUN: 20 mg/dL (ref 8–23)
CO2: 24 mmol/L (ref 22–32)
Calcium: 9.3 mg/dL (ref 8.9–10.3)
Chloride: 106 mmol/L (ref 98–111)
Creatinine, Ser: 0.66 mg/dL (ref 0.44–1.00)
GFR, Estimated: >60 mL/min (ref >60)
Glucose, Bld: 99 mg/dL (ref 70–99)
Potassium: 3.7 mmol/L (ref 3.5–5.1)
Sodium: 137 mmol/L (ref 135–145)
Total Bilirubin: 0.7 mg/dL (ref 0.3–1.2)
Total Protein: 7 g/dL (ref 6.5–8.1)

## 2023-08-13 LAB — TSH: TSH: 2.004 u[IU]/mL (ref 0.350–4.500)

## 2023-08-13 MED ORDER — SODIUM CHLORIDE 0.9 % IV SOLN
Freq: Once | INTRAVENOUS | Status: AC
  Filled 2023-08-13: qty 250

## 2023-08-13 MED ORDER — HEPARIN SOD (PORK) LOCK FLUSH 100 UNIT/ML IV SOLN
500.0000 [IU] | Freq: Once | INTRAVENOUS | Status: AC | PRN
  Administered 2023-08-13: 500 [IU]
  Filled 2023-08-13: qty 5

## 2023-08-13 MED ORDER — SODIUM CHLORIDE 0.9 % IV SOLN
200.0000 mg | Freq: Once | INTRAVENOUS | Status: AC
  Administered 2023-08-13: 200 mg via INTRAVENOUS
  Filled 2023-08-13: qty 8

## 2023-08-13 NOTE — Assessment & Plan Note (Signed)
#  JUNE 2023-STAGE IV--recurrent/metastatic disease with New left adrenal metastasis;  PET scan JUNE 20th- 2023-  Enlarged 4 cm hypermetabolic LEFT adrenal metastasis;  Suspicion of metastatic adenopathy to the LEFT hilum.  Part solid nodule in the RIGHT lower lobe without metabolic activity. 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 Currently on single agent Keytruda [PD-L1 greater than 80% ].    # # CT scan- June 13th, 2024- Status post left lower lobectomy. No evidence of recurrent or metastatic disease in the chest; Unchanged small ground-glass nodules in the right lower lobe, including a 1.0 x 0.8 cm nodule in the dependent right lower lobe- stable.   # Proceed of Keytruda single agent. Labs today reviewed;  acceptable for treatment today. Tolerating well except for mild chills. TSH- MAY 2024- WNL-stable.   # Right mildly complex cystic adnexal mass noted-June 13th, 2024- Significant interval enlargement of a multiseptated cystic lesion of the right ovary, now measuring 11.7 x 8.2 cm, previously 8.1 x 5.8 cm. 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  S/p  cardiac /pulmonary- PFTs/ stress test- stable- awaiting surgery in [facility] in end of October, 2024.   # mild Hypokalemia: discussed dietary supplement. stable  # Chronic pain: headaches/cervical pain/back pain [Dr.Naveira; pain doctor]  On NSAIDs [ monitor for now. OCT 2023- MRI brain- NEGATIVE for any metastatic disease.   Stable.  FEB 2024- MRI lumbar spine- degenerative disease- on ibuprofen prn- BID.  stable  # Left rib pain-Post throacotomy pain/tingling and numbness:  Continue gabapentin -300 mg TID; and then at extra at night prn. stable  # CAD/ PVD- cramping- s/p evaluation [Dr.Schneir]; ? Carotid occlusion- needing CT scan neck- pending.   # COPD-stable encouraged continue to avoid smoking; again counseled to quit  smoking; s/p pulmonary; awaiting PFTs-stable  # IV Access :s/p  port placement-stable  *AM appts- Monday-   # DISPOSITION:  # keytruda today; # Follow-up in 3  weeks-MD labs/port- cbc/cmp;TSH; [id number] -  Dr. Leonard Schwartz

## 2023-08-13 NOTE — Progress Notes (Signed)
[facility] CONSULT NOTE  Patient Care Team: Malva Limes, MD as PCP - General (Family Medicine) Lady Gary Darlin Priestly, MD as Consulting Physician (Cardiology) Schnier, Latina Craver, MD (Vascular Surgery) Lonell Face, MD as Consulting Physician (Neurology) Delano Metz, MD as Referring Physician (Pain Medicine) Pa, [facility] (Optometry) Lady Gary, Darlin Priestly, MD as Consulting Physician (Cardiology) Pasty Spillers, MD (Inactive) as Consulting Physician (Gastroenterology) Nadara Mustard, MD as Referring Physician (Obstetrics and Gynecology) Glory Buff, RN as Oncology Nurse Navigator Earna Coder, MD as Consulting Physician (Oncology)   CHIEF COMPLAINTS/PURPOSE OF CONSULTATION: lung cancer  #  Oncology History Overview Note  #MAY 2022- LLL nodule 2.3 cm Adenocarcinoma Stage IA; s/p lobectomy.  No adjuvant therapy  # CT scan 4th June 2023-highly concerning for recurrent/metastatic disease with-. New left adrenal metastasis;  Ground-glass and part solid nodules in the peripheral right lower lobe, unchanged from 11/07/2021 but slightly enlarged from 01/01/2018. Findings are suspicious for indolent adenocarcinoma.  F One- PFL1 =80%; KRAS G12C PET scan JUNE 20th- 2023-  Enlarged 4 cm hypermetabolic LEFT adrenal metastasis;  Suspicion of metastatic adenopathy to the LEFT hilum.  Part solid nodule in the RIGHT lower lobe without metabolic activity.   3 ADRENAL BIOPSY- CK7 POSITIVE ADENOCARCINOMA- There is limited tissue remaining for ancillary testing, not likely  sufficient for NGS testing.   # AUG 7th, 2023-single agent Keytruda.    Primary cancer of left lower lobe of lung (HCC)  05/09/2021 Initial Diagnosis   Primary cancer of left lower lobe of lung (HCC)   07/04/2022 -  Chemotherapy   Patient is on Treatment Plan : LUNG NSCLC Pembrolizumab (200) q21d     07/10/2022 - 07/10/2022 Chemotherapy   Patient is on Treatment Plan : LUNG NSCLC Pembrolizumab  (200) q21d      HISTORY OF PRESENTING ILLNESS: Patient  ambulating.  Alone.   Laura Nielsen 67 y.o.  female history of smoking; prior history of lung cancer-with likely recurrence to left adrenal gland [s/p Bx CK-7 positive TTF-1 negative]-clinically suggestive of recurrent lung cancer on single agent Rande Lawman is here for follow-up.   S/p evaluation with vascular. Awaiting CT neck for evaluation of vascular occulusion in neck.   Fatigue, shortness of breath sometimes, and her ankles are still bothering her.   Currently s/p cardiology and pulmonary appt.    Denies any abdominal pain. Otherwise patient continues to have mild shortness of breath on exertion.  Denies any nausea vomiting. Complains of cramping in the legs chronic.   Review of Systems  Constitutional:  Negative for chills, diaphoresis, fever, malaise/fatigue and weight loss.  HENT:  Negative for nosebleeds and sore throat.   Eyes:  Negative for double vision.  Respiratory:  Negative for cough, hemoptysis, sputum production, shortness of breath and wheezing.   Cardiovascular:  Negative for palpitations, orthopnea and leg swelling.  Gastrointestinal:  Negative for abdominal pain, blood in stool, constipation, diarrhea, heartburn, melena, nausea and vomiting.  Genitourinary:  Negative for dysuria, frequency and urgency.  Musculoskeletal:  Positive for back pain and joint pain.  Skin: Negative.  Negative for itching and rash.  Neurological:  Positive for tingling. Negative for dizziness, focal weakness, weakness and headaches.  Endo/Heme/Allergies:  Does not bruise/bleed easily.  Psychiatric/Behavioral:  Negative for depression. The patient is not nervous/anxious and does not have insomnia.      MEDICAL HISTORY:  Past Medical History:  Diagnosis Date   AAA (abdominal aortic aneurysm) (HCC)    s/p EVAR  AAA 03/27/13   Arthritis    Asthma    Cancer (HCC)    Centrilobular emphysema (HCC)    Complication of anesthesia    BP  drops after   Coronary artery disease    History of kidney stones    Osteoporosis    Primary cancer of left lower lobe of lung (HCC)    Sleep apnea     SURGICAL HISTORY: Past Surgical History:  Procedure Laterality Date   ABDOMINAL AORTIC ENDOVASCULAR STENT GRAFT  03/27/2013   Dr. Levora Dredge   Cardiac catheterization  02/2009   70-80% stenosis RCA stent placed. started on Plavix   CARDIAC CATHETERIZATION     CATARACT EXTRACTION W/PHACO Left 05/14/2023   Procedure: CATARACT EXTRACTION PHACO AND INTRAOCULAR LENS PLACEMENT (IOC) LEFT DIABETIC  OMIDRIA  19.40  01:34.8;  Surgeon: Estanislado Pandy, MD;  Location: [facility];  Service: Ophthalmology;  Laterality: Left;   CATARACT EXTRACTION W/PHACO Right 06/27/2023   Procedure: CATARACT EXTRACTION PHACO AND INTRAOCULAR LENS PLACEMENT (IOC) RIGHT DIABETIC  6.47  00:42.6;  Surgeon: Estanislado Pandy, MD;  Location: [facility];  Service: Ophthalmology;  Laterality: Right;   COLONOSCOPY WITH PROPOFOL N/A 01/05/2021   Procedure: COLONOSCOPY WITH PROPOFOL;  Surgeon: Pasty Spillers, MD;  Location: [facility] END[facility]COPY;  Service: Endoscopy;  Laterality: N/A;   CORONARY ANGIOPLASTY     stent placed   ESOPHAGOGASTRODUODENOSCOPY (EGD) WITH PROPOFOL N/A 01/05/2021   Procedure: ESOPHAGOGASTRODUODENOSCOPY (EGD) WITH PROPOFOL;  Surgeon: Pasty Spillers, MD;  Location: [facility] END[facility]COPY;  Service: Endoscopy;  Laterality: N/A;   INTERCOSTAL NERVE BLOCK Left 04/06/2021   Procedure: INTERCOSTAL NERVE BLOCK;  Surgeon: Loreli Slot, MD;  Location: [facility] OR;  Service: Thoracic;  Laterality: Left;   IR IMAGING GUIDED PORT INSERTION  06/20/2022   KIDNEY STONE SURGERY  1999   LUNG LOBECTOMY Left 04/06/2021   robotic left lower lobectomy 04/06/2021 Dr. Dorris Fetch for Stage 1A adenocarcinoma   NODE DISSECTION Left 04/06/2021   Procedure: NODE DISSECTION;  Surgeon: Loreli Slot, MD;  Location[facility] OR;  Service: Thoracic;   Laterality: Left;   TUBAL LIGATION     VAGINAL HYSTERECTOMY     Menometrorrhagia. Excessive bleeding. Unknown if cervix removed.     SOCIAL HISTORY: Social History   Socioeconomic History   Marital status: Divorced    Spouse name: Not on file   Number of children: 2   Years of education: H/S   Highest education level: High school graduate  Occupational History   Occupation: Disabled   Occupation: retired  Tobacco Use   Smoking status: Every Day    Current packs/day: 1.00    Average packs/day: 0.7 packs/day for 96.2 years (62.8 ttl pk-yrs)    Types: Cigarettes    Start date: 1973   Smokeless tobacco: Never   Tobacco comments:    12/23/19 states she quit for 9 months and then started back.   Vaping Use   Vaping status: Never Used  Substance and Sexual Activity   Alcohol use: Yes    Comment: rarely - once a year   Drug use: No   Sexual activity: Not on file  Other Topics Concern   Not on file  Social History Narrative   Hx of smoking; quit prior to lung surgery then started back. Lives in [place] alone. Used to work in [employer], [employer]- Facilities manager. On disability sec to spinal pain.    Social Determinants of Health   Financial Resource Strain: Medium Risk (02/16/2023)  Overall Financial Resource Strain (CARDIA)    Difficulty of Paying Living Expenses: Somewhat hard  Food Insecurity: Patient Declined (02/16/2023)   Hunger Vital Sign    Worried About Running Out of Food in the Last Year: Patient declined    Ran Out of Food in the Last Year: Patient declined  Transportation Needs: No Transportation Needs (02/16/2023)   PRAPARE - Administrator, Civil Service (Medical): No    Lack of Transportation (Non-Medical): No  Physical Activity: Unknown (02/16/2023)   Exercise Vital Sign    Days of Exercise per Week: 0 days    Minutes of Exercise per Session: Patient declined  Stress: Stress Concern Present (02/16/2023)   [employer] of Occupational Health -  Occupational Stress Questionnaire    Feeling of Stress : Rather much  Social Connections: Unknown (02/16/2023)   Social Connection and Isolation Panel [NHANES]    Frequency of Communication with Friends and Family: More than three times a week    Frequency of Social Gatherings with Friends and Family: Once a week    Attends Religious Services: Not on Insurance claims handler of Clubs or Organizations: No    Attends Banker Meetings: Never    Marital Status: Patient declined  Intimate Partner Violence: Not At Risk (02/20/2023)   Humiliation, Afraid, Rape, and Kick questionnaire    Fear of Current or Ex-Partner: No    Emotionally Abused: No    Physically Abused: No    Sexually Abused: No    FAMILY HISTORY: Family History  Problem Relation Age of Onset   Hypertension Mother    Coronary artery disease Mother    Heart attack Mother        acute   Cancer Mother    Alcohol abuse Father    Depression Father    Hypertension Father    Heart attack Father 62       acute   Alcohol abuse Sister    Hyperlipidemia Sister    Hypertension Sister    Cancer Sister 57   Heart attack Sister        x's 2   Coronary artery disease Sister 25       x's 2   Breast cancer Sister 25    ALLERGIES:  is allergic to atorvastatin, omeprazole, and bupropion.  MEDICATIONS:  Current Outpatient Medications  Medication Sig Dispense Refill   allopurinol (ZYLOPRIM) 100 MG tablet Take 1 tablet (100 mg total) by mouth daily. 90 tablet 3   aspirin 81 MG tablet Take 81 mg by mouth daily.      bisoprolol (ZEBETA) 10 MG tablet Take 1 tablet (10 mg total) by mouth daily. 90 tablet 1   Budeson-Glycopyrrol-Formoterol (BREZTRI AEROSPHERE) 160-9-4.8 MCG/ACT AERO Inhale 2 puffs into the lungs in the morning and at bedtime. 1284 g 11   Cholecalciferol 25 MCG (1000 UT) capsule Take 1,000 Units by mouth daily.     clopidogrel (PLAVIX) 75 MG tablet Take 1 tablet (75 mg total) by mouth daily. 90 tablet 4    diazepam (VALIUM) 5 MG tablet Take 1 tablet (5 mg total) by mouth every 12 (twelve) hours as needed. 30 tablet 0   HYDROcodone-acetaminophen (NORCO/VICODIN) 5-325 MG tablet Take 1-2 tablets by mouth every 6 (six) hours as needed for moderate pain. 20 tablet 0   ibuprofen (ADVIL) 200 MG tablet Take 400 mg by mouth 2 (two) times daily as needed (headaches).     lidocaine-prilocaine (EMLA) cream Apply on the  port. 30 -45 min  prior to port access. 30 g 3   lisinopril-hydrochlorothiazide (ZESTORETIC) 20-12.5 MG tablet Take 1 tablet by mouth daily. 90 tablet 1   magnesium gluconate (MAGONATE) 500 MG tablet Take 500 mg by mouth daily. At bedtime     pantoprazole (PROTONIX) 20 MG tablet Take 1 tablet (20 mg total) by mouth daily. 90 tablet 1   PROAIR HFA 108 (90 Base) MCG/ACT inhaler Inhale 2 puffs into the lungs every 6 (six) hours as needed for wheezing or shortness of breath. 8.5 g 4   rosuvastatin (CRESTOR) 20 MG tablet Take 1 tablet (20 mg total) by mouth daily. 90 tablet 1   Semaglutide, 1 MG/DOSE, (OZEMPIC, 1 MG/DOSE,) 4 MG/3ML SOPN Inject 1 mg into the skin once a week. 9 mL 1   fluticasone (FLONASE) 50 MCG/ACT nasal spray Place into both nostrils daily. (Patient not taking: Reported on 06/19/2023)     No current facility-administered medications for this visit.   Facility-Administered Medications Ordered in Other Visits  Medication Dose Route Frequency Provider Last Rate Last Admin   heparin lock flush 100 UNIT/ML injection             Mild maculopapular rash on the Maller area left more than right.  PHYSICAL EXAMINATION: ECOG PERFORMANCE STATUS: 1 - Symptomatic but completely ambulatory  Vitals:   08/13/23 0821  BP: 110/72  Pulse: 69  Temp: 97.8 F (36.6 C)  SpO2: 100%      Filed Weights   08/13/23 0821  Weight: 200 lb (90.7 kg)       Physical Exam HENT:     Head: Normocephalic and atraumatic.     Mouth/Throat:     Pharynx: No oropharyngeal exudate.  Eyes:      Pupils: Pupils are equal, round, and reactive to light.  Cardiovascular:     Rate and Rhythm: Normal rate and regular rhythm.  Pulmonary:     Comments: Decreased breath sounds bilaterally.  No wheeze or crackles Abdominal:     General: Bowel sounds are normal. There is no distension.     Palpations: Abdomen is soft. There is no mass.     Tenderness: There is no abdominal tenderness. There is no guarding or rebound.  Musculoskeletal:        General: No tenderness. Normal range of motion.     Cervical back: Normal range of motion and neck supple.  Skin:    General: Skin is warm.  Neurological:     Mental Status: She is alert and oriented to person, place, and time.  Psychiatric:        Mood and Affect: Affect normal.      LABORATORY DATA:  I have reviewed the data as listed Lab Results  Component Value Date   WBC 9.2 08/13/2023   HGB 14.8 08/13/2023   HCT 45.3 08/13/2023   MCV 85.8 08/13/2023   PLT 227 08/13/2023   Recent Labs    07/02/23 0933 07/23/23 0805 08/13/23 0818  NA 142 136 137  K 3.8 3.7 3.7  CL 107 106 106  CO2 25 24 24   GLUCOSE 96 93 99  BUN 15 21 20   CREATININE 0.63 0.67 0.66  CALCIUM 9.6 9.4 9.3  GFRNONAA >60 >60 >60  PROT 7.0 7.0 7.0  ALBUMIN 3.9 4.0 4.0  AST 22 24 21   ALT 18 16 17   ALKPHOS 81 87 86  BILITOT 0.7 0.7 0.7    RADIOGRAPHIC STUDIES: I have personally reviewed the radiological images  as listed and agreed with the findings in the report. VAS [place] ABI WITH/WO TBI  Result Date: 08/09/2023  LOWER EXTREMITY DOPPLER STUDY Patient Name:  ELZADA MCCREEDY  Date of Exam:   08/01/2023 Medical Rec #: [id number]    Accession #:    [id number] Date of Birth: 10/09/1956    Patient Gender: F Patient Age:   43 years Exam Location:  [facility] Procedure:      VAS [place] ABI WITH/WO TBI Referring Phys: [facility] --------------------------------------------------------------------------------  Indications: Claudication, and peripheral artery disease.  High Risk         Hypertension, Diabetes, current smoker, coronary artery Factors:          disease. Other Factors: Patient complains of nocturnal leg cramps.  Vascular Interventions: 03/27/13: Aortoiliac stent for aortoiliac stenosis. Performing Technologist: Hardie Lora RVT  Examination Guidelines: A complete evaluation includes at minimum, Doppler waveform signals and systolic blood pressure reading at the level of bilateral brachial, anterior tibial, and posterior tibial arteries, when vessel segments are accessible. Bilateral testing is considered an integral part of a complete examination. Photoelectric Plethysmograph (PPG) waveforms and toe systolic pressure readings are included as required and additional duplex testing as needed. Limited examinations for reoccurring indications may be performed as noted.  ABI Findings: +---------+------------------+-----+---------+--------+ Right    Rt Pressure (mmHg)IndexWaveform Comment  +---------+------------------+-----+---------+--------+ Brachial 124                                      +---------+------------------+-----+---------+--------+ PTA      138               1.07 triphasic         +---------+------------------+-----+---------+--------+ DP       139               1.08 biphasic          +---------+------------------+-----+---------+--------+ Great Toe121               0.94                   +---------+------------------+-----+---------+--------+ +---------+------------------+-----+---------+-------+ Left     Lt Pressure (mmHg)IndexWaveform Comment +---------+------------------+-----+---------+-------+ Brachial 129                                     +---------+------------------+-----+---------+-------+ PTA      125               0.97 triphasic        +---------+------------------+-----+---------+-------+ DP       122               0.95 triphasic         +---------+------------------+-----+---------+-------+ Great Toe114               0.88                  +---------+------------------+-----+---------+-------+ +-------+-----------+-----------+------------+------------+ ABI/TBIToday's ABIToday's TBIPrevious ABIPrevious TBI +-------+-----------+-----------+------------+------------+ Right  1.08       0.94       1.08        0.86         +-------+-----------+-----------+------------+------------+ Left   0.97       0.88       1.09        0.83         +-------+-----------+-----------+------------+------------+  Bilateral ABIs appear essentially unchanged compared to prior study on 11/20/2022.  Summary: Right: Resting right ankle-brachial index is within normal range. The right toe-brachial index is normal. Left: Resting left ankle-brachial index is within normal range. The left toe-brachial index is normal. *See table(s) above for measurements and observations.  Electronically signed by Levora Dredge MD on 08/09/2023 at 8:31:21 AM.    Final    VAS US CAROTID  Result Date: 08/09/2023 Carotid Arterial Duplex Study Patient Name:  MARLEENE KHALILI  Date of Exam:   08/01/2023 Medical Rec #: [id number]    Accession #:    [id number] Date of Birth: 10/16/1956    Patient Gender: F Patient Age:   43 years Exam Location:  [facility] Procedure:      VAS US CAROTID Referring Phys: Levora Dredge --------------------------------------------------------------------------------  Indications:                            Carotid artery disease. Risk Factors:                           Hypertension, Diabetes, current smoker,                                         coronary artery disease, PAD. Pre-Surgical Evaluation & Surgical      Stenosis at RICA only. ICA is normal Correlation                             past the stenosis. Anatomy on the right                                         is within normal limits.Right                                          bifurcation is located near the Hyoid                                         Notch. Performing Technologist: Hardie Lora RVT  Examination Guidelines: A complete evaluation includes B-mode imaging, spectral Doppler, color Doppler, and power Doppler as needed of all accessible portions of each vessel. Bilateral testing is considered an integral part of a complete examination. Limited examinations for reoccurring indications may be performed as noted.  Right Carotid Findings: +----------+--------+--------+--------+--------------------------+--------+           PSV cm/sEDV cm/sStenosisPlaque Description        Comments +----------+--------+--------+--------+--------------------------+--------+ CCA Prox  63      14                                                 +----------+--------+--------+--------+--------------------------+--------+ CCA Mid   57      21                                                 +----------+--------+--------+--------+--------------------------+--------+  CCA Distal40      15                                                 +----------+--------+--------+--------+--------------------------+--------+ ICA Prox  312     120     80-99%  irregular and heterogenous         +----------+--------+--------+--------+--------------------------+--------+ ICA Mid   163     47      40-59%  irregular                          +----------+--------+--------+--------+--------------------------+--------+ ICA Distal121     43                                                 +----------+--------+--------+--------+--------------------------+--------+ ECA       180     25      >50%    calcific and irregular             +----------+--------+--------+--------+--------------------------+--------+ +----------+--------+-------+----------------+-------------------+           PSV cm/sEDV cmsDescribe        Arm Pressure (mmHG)  +----------+--------+-------+----------------+-------------------+ [id number]             Multiphasic, WNL                    +----------+--------+-------+----------------+-------------------+ +---------+--------+--+--------+--+---------+ VertebralPSV cm/s60EDV cm/s18Antegrade +---------+--------+--+--------+--+---------+  Left Carotid Findings: +----------+--------+--------+--------+----------------------+--------+           PSV cm/sEDV cm/sStenosisPlaque Description    Comments +----------+--------+--------+--------+----------------------+--------+ CCA Prox  90      26                                             +----------+--------+--------+--------+----------------------+--------+ CCA Mid   72      22                                             +----------+--------+--------+--------+----------------------+--------+ CCA Distal90      27                                             +----------+--------+--------+--------+----------------------+--------+ ICA Prox  104     40      40-59%  irregular and calcific         +----------+--------+--------+--------+----------------------+--------+ ICA Mid   111     36                                             +----------+--------+--------+--------+----------------------+--------+ ICA Distal91      31                                             +----------+--------+--------+--------+----------------------+--------+  ECA       90      13                                             +----------+--------+--------+--------+----------------------+--------+ +----------+--------+--------+----------------+-------------------+           PSV cm/sEDV cm/sDescribe        Arm Pressure (mmHG) +----------+--------+--------+----------------+-------------------+ [id number]              Multiphasic, WNL                    +----------+--------+--------+----------------+-------------------+  +---------+--------+--+--------+--+---------+ VertebralPSV cm/s63EDV cm/s16Antegrade +---------+--------+--+--------+--+---------+   Summary: Right Carotid: Velocities in the right ICA are consistent with a 80-99%                stenosis. The ECA appears >50% stenosed. Left Carotid: Velocities in the left ICA are consistent with a 40-59% stenosis. Vertebrals:  Bilateral vertebral arteries demonstrate antegrade flow. Subclavians: Normal flow hemodynamics were seen in bilateral subclavian              arteries. *See table(s) above for measurements and observations.  Electronically signed by Levora Dredge MD on 08/09/2023 at 8:30:55 AM.    Final      ASSESSMENT & PLAN:   Primary cancer of left lower lobe of lung (HCC) #JUNE 2023-STAGE IV--recurrent/metastatic disease with New left adrenal metastasis;  PET scan JUNE 20th- 2023-  Enlarged 4 cm hypermetabolic LEFT adrenal metastasis;  Suspicion of metastatic adenopathy to the LEFT hilum.  Part solid nodule in the RIGHT lower lobe without metabolic activity. 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 Currently on single agent Keytruda [PD-L1 greater than 80% ].    # # CT scan- June 13th, 2024- Status post left lower lobectomy. No evidence of recurrent or metastatic disease in the chest; Unchanged small ground-glass nodules in the right lower lobe, including a 1.0 x 0.8 cm nodule in the dependent right lower lobe- stable.   # Proceed of Keytruda single agent. Labs today reviewed;  acceptable for treatment today. Tolerating well except for mild chills. TSH- MAY 2024- WNL-stable.   # Right mildly complex cystic adnexal mass noted-June 13th, 2024- Significant interval enlargement of a multiseptated cystic lesion of the right ovary, now measuring 11.7 x 8.2 cm, previously 8.1 x 5.8 cm. 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  S/p  cardiac  /pulmonary- PFTs/ stress test- stable- awaiting surgery in [facility] in end of October, 2024.   # mild Hypokalemia: discussed dietary supplement. stable  # Chronic pain: headaches/cervical pain/back pain [Dr.Naveira; pain doctor]  On NSAIDs [ monitor for now. OCT 2023- MRI brain- NEGATIVE for any metastatic disease.   Stable.  FEB 2024- MRI lumbar spine- degenerative disease- on ibuprofen prn- BID.  stable  # Left rib pain-Post throacotomy pain/tingling and numbness:  Continue gabapentin -300 mg TID; and then at extra at night prn. stable  # CAD/ PVD- cramping- s/p evaluation [Dr.Schneir]; ? Carotid occlusion- needing CT scan neck- pending.   # COPD-stable encouraged continue to avoid smoking; again counseled to quit smoking; s/p pulmonary; awaiting PFTs-stable  # IV Access :s/p  port placement-stable  *AM appts- Monday-   # DISPOSITION:  # keytruda today; # Follow-up in 3  weeks-MD labs/port- cbc/cmp;TSH; [id number] -  Dr.  Fabio Neighbors, MD 08/13/2023 9:30 AM

## 2023-08-13 NOTE — Progress Notes (Signed)
Patient says that nothing has really changed since her last visit which was on 07/23/2023

## 2023-08-15 ENCOUNTER — Encounter: Payer: Self-pay | Admitting: Family Medicine

## 2023-08-15 ENCOUNTER — Ambulatory Visit (INDEPENDENT_AMBULATORY_CARE_PROVIDER_SITE_OTHER): Payer: Medicare Other | Admitting: Family Medicine

## 2023-08-15 VITALS — BP 87/55 | HR 68 | Ht 68.0 in | Wt 201.1 lb

## 2023-08-15 DIAGNOSIS — C3432 Malignant neoplasm of lower lobe, left bronchus or lung: Secondary | ICD-10-CM | POA: Diagnosis not present

## 2023-08-15 DIAGNOSIS — I10 Essential (primary) hypertension: Secondary | ICD-10-CM

## 2023-08-15 DIAGNOSIS — F3341 Major depressive disorder, recurrent, in partial remission: Secondary | ICD-10-CM | POA: Diagnosis not present

## 2023-08-15 DIAGNOSIS — E119 Type 2 diabetes mellitus without complications: Secondary | ICD-10-CM

## 2023-08-15 DIAGNOSIS — R252 Cramp and spasm: Secondary | ICD-10-CM | POA: Diagnosis not present

## 2023-08-15 DIAGNOSIS — Z7985 Long-term (current) use of injectable non-insulin antidiabetic drugs: Secondary | ICD-10-CM

## 2023-08-15 MED ORDER — LISINOPRIL-HYDROCHLOROTHIAZIDE 20-12.5 MG PO TABS: 0.5000 | ORAL_TABLET | Freq: Every day | ORAL | Status: DC

## 2023-08-15 NOTE — Patient Instructions (Addendum)
Please review the attached list of medications and notify my office if there are any errors.   Please bring all of your medications to every appointment so we can make sure that our medication list is the same as yours.   Reduce lisinopril-hydrochlorothiazide to 1/2 tablet every day

## 2023-08-17 ENCOUNTER — Ambulatory Visit
Admission: RE | Admit: 2023-08-17 | Discharge: 2023-08-17 | Disposition: A | Discharge status: Home / Self Care | Payer: Medicare Other | Source: Ambulatory Visit | Attending: Vascular Surgery | Admitting: Vascular Surgery

## 2023-08-17 DIAGNOSIS — I6523 Occlusion and stenosis of bilateral carotid arteries: Secondary | ICD-10-CM | POA: Insufficient documentation

## 2023-08-17 DIAGNOSIS — I708 Atherosclerosis of other arteries: Secondary | ICD-10-CM | POA: Diagnosis not present

## 2023-08-17 DIAGNOSIS — I700 Atherosclerosis of aorta: Secondary | ICD-10-CM | POA: Diagnosis not present

## 2023-08-17 MED ORDER — IOHEXOL 350 MG/ML SOLN
75.0000 mL | Freq: Once | INTRAVENOUS | Status: AC | PRN
  Administered 2023-08-17: 75 mL via INTRAVENOUS

## 2023-08-20 NOTE — Progress Notes (Unsigned)
Established patient visit   Patient: Laura Nielsen   DOB: January 11, 1956   67 y.o. Female  MRN: [id number] Visit Date: 08/15/2023  Today's healthcare provider: Mila Merry, MD   Chief Complaint  Patient presents with   Medical Management of Chronic Issues    Follow up for type 2 Diabetes    Subjective    Discussed the use of AI scribe software for clinical note transcription with the patient, who gave verbal consent to proceed.  History of Present Illness   The patient, with a history of lung cancer and hypertension, presents for a routine checkup. She reports feeling tired, attributing this to her ongoing cancer treatments. She also mentions a lack of appetite, but has been making efforts to eat healthier over the past two to three years, including consuming more fruits and avoiding fast food.  The patient's blood pressure has been running low, and she expresses uncertainty about the cause. She has been undergoing regular blood work at the cancer center, with the most recent results showing a slightly elevated red blood cell count, which she understands to be a normal response to her treatments. All other results were within normal limits.  The patient has been experiencing leg cramps, particularly in the ankles and top of the foot, severe enough to wake her up at night. She has started taking magnesium on the advice of her doctor, and reports some improvement in the severity of the cramps.  The patient is currently taking Ozempic for diabetes with last A1c=5.9% in March.  The patient has lost a significant amount of weight since starting Ozempic, which she is pleased about, but she understands that weight loss is not ideal during cancer treatment. She expresses a sense of being overwhelmed by her health issues, but is trying to stay positive.       Medications: Outpatient Medications Prior to Visit  Medication Sig   allopurinol (ZYLOPRIM) 100 MG tablet Take 1 tablet (100 mg  total) by mouth daily.   aspirin 81 MG tablet Take 81 mg by mouth daily.    bisoprolol (ZEBETA) 10 MG tablet Take 1 tablet (10 mg total) by mouth daily.   Budeson-Glycopyrrol-Formoterol (BREZTRI AEROSPHERE) 160-9-4.8 MCG/ACT AERO Inhale 2 puffs into the lungs in the morning and at bedtime.   Cholecalciferol 25 MCG (1000 UT) capsule Take 1,000 Units by mouth daily.   clopidogrel (PLAVIX) 75 MG tablet Take 1 tablet (75 mg total) by mouth daily.   diazepam (VALIUM) 5 MG tablet Take 1 tablet (5 mg total) by mouth every 12 (twelve) hours as needed.   fluticasone (FLONASE) 50 MCG/ACT nasal spray Place into both nostrils daily.   HYDROcodone-acetaminophen (NORCO/VICODIN) 5-325 MG tablet Take 1-2 tablets by mouth every 6 (six) hours as needed for moderate pain.   ibuprofen (ADVIL) 200 MG tablet Take 400 mg by mouth 2 (two) times daily as needed (headaches).   lidocaine-prilocaine (EMLA) cream Apply on the port. 30 -45 min  prior to port access.   magnesium gluconate (MAGONATE) 500 MG tablet Take 500 mg by mouth daily. At bedtime   pantoprazole (PROTONIX) 20 MG tablet Take 1 tablet (20 mg total) by mouth daily.   PROAIR HFA 108 (90 Base) MCG/ACT inhaler Inhale 2 puffs into the lungs every 6 (six) hours as needed for wheezing or shortness of breath.   rosuvastatin (CRESTOR) 20 MG tablet Take 1 tablet (20 mg total) by mouth daily.   Semaglutide, 1 MG/DOSE, (OZEMPIC, 1 MG/DOSE,) 4 MG/3ML  SOPN Inject 1 mg into the skin once a week.   lisinopril-hydrochlorothiazide (ZESTORETIC) 20-12.5 MG tablet Take 1 tablet by mouth daily.   Facility-Administered Medications Prior to Visit  Medication Dose Route Frequency Provider   heparin lock flush 100 UNIT/ML injection        Review of Systems  Constitutional:  Negative for appetite change, chills, fatigue and fever.  Respiratory:  Negative for chest tightness and shortness of breath.   Cardiovascular:  Negative for chest pain and palpitations.  Gastrointestinal:   Negative for abdominal pain, nausea and vomiting.  Neurological:  Negative for dizziness and weakness.   {Insert previous labs (optional):23779} {See past labs  Heme  Chem  Endocrine  Serology  Results Review (optional):1}   Objective    BP (!) 87/55 (BP Location: Left Arm, Patient Position: Sitting, Cuff Size: Large)   Pulse 68   Ht 5\' 8"  (1.727 m)   Wt 201 lb 1.6 oz (91.2 kg)   SpO2 98%   BMI 30.58 kg/m {Insert last BP/Wt (optional):23777}{See vitals history (optional):1}  Physical Exam  General appearance: Mildly obese female, cooperative and in no acute distress Head: Normocephalic, without obvious abnormality, atraumatic Respiratory: Respirations even and unlabored, normal respiratory rate Extremities: All extremities are intact.  Skin: Skin color, texture, turgor normal. No rashes seen  Psych: Appropriate mood and affect. Neurologic: Mental status: Alert, oriented to person, place, and time, thought content appropriate.      Assessment & Plan        Hypertension Low blood pressure readings, possibly due to weight loss and ongoing cancer treatments. Discussed the risk of hypotension with current Lisinopril dose. -Reduce Lisinopril-hctz to 10-6.25mg  daily by cutting current pills in half.  Leg Cramps New onset, severe, nocturnal leg cramps, possibly related to electrolyte imbalance. Magnesium supplementation initiated by another provider. -Continue Magnesium 500mg  at bedtime as directed by Dr. Eliseo Squires.  Lung Cancer Ongoing treatment causing fatigue and decreased appetite. Upcoming surgery for ovarian tumor. -Continue current cancer treatments as directed by oncology team. -Surgery for ovarian tumor scheduled for 09/27/2023.  Diabetes Blood sugars reportedly well-controlled with Ozempic 1mg . -Continue Ozempic 1mg  for diabetes management. -HgA1c  Follow-up in 3 months (between Thanksgiving and Christmas) to reassess blood pressure, blood sugar control, and overall  health status post-surgery.    Return in about 3 months (around 11/14/2023).      Mila Merry, MD  [facility] [phone] (phone) [phone] (fax)  [facility]

## 2023-08-28 DIAGNOSIS — N9489 Other specified conditions associated with female genital organs and menstrual cycle: Secondary | ICD-10-CM | POA: Diagnosis not present

## 2023-09-03 ENCOUNTER — Encounter: Payer: Self-pay | Admitting: Internal Medicine

## 2023-09-03 ENCOUNTER — Other Ambulatory Visit: Payer: Medicare Other

## 2023-09-03 ENCOUNTER — Ambulatory Visit: Payer: Medicare Other

## 2023-09-03 ENCOUNTER — Ambulatory Visit: Payer: Medicare Other | Admitting: Internal Medicine

## 2023-09-03 ENCOUNTER — Inpatient Hospital Stay (HOSPITAL_BASED_OUTPATIENT_CLINIC_OR_DEPARTMENT_OTHER): Payer: Medicare Other | Admitting: Internal Medicine

## 2023-09-03 ENCOUNTER — Inpatient Hospital Stay: Payer: Medicare Other

## 2023-09-03 VITALS — BP 130/83 | HR 63

## 2023-09-03 VITALS — BP 127/86 | HR 72 | Temp 97.7°F | Ht 68.0 in | Wt 200.0 lb

## 2023-09-03 DIAGNOSIS — F17210 Nicotine dependence, cigarettes, uncomplicated: Secondary | ICD-10-CM | POA: Diagnosis not present

## 2023-09-03 DIAGNOSIS — C7972 Secondary malignant neoplasm of left adrenal gland: Secondary | ICD-10-CM | POA: Diagnosis not present

## 2023-09-03 DIAGNOSIS — Z5112 Encounter for antineoplastic immunotherapy: Secondary | ICD-10-CM | POA: Diagnosis not present

## 2023-09-03 DIAGNOSIS — C3432 Malignant neoplasm of lower lobe, left bronchus or lung: Secondary | ICD-10-CM

## 2023-09-03 DIAGNOSIS — E039 Hypothyroidism, unspecified: Secondary | ICD-10-CM | POA: Diagnosis not present

## 2023-09-03 DIAGNOSIS — D3911 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: Secondary | ICD-10-CM | POA: Diagnosis not present

## 2023-09-03 DIAGNOSIS — Z902 Acquired absence of lung [part of]: Secondary | ICD-10-CM | POA: Diagnosis not present

## 2023-09-03 DIAGNOSIS — Z85118 Personal history of other malignant neoplasm of bronchus and lung: Secondary | ICD-10-CM | POA: Diagnosis not present

## 2023-09-03 LAB — COMPREHENSIVE METABOLIC PANEL
ALT: 15 U/L (ref 0–44)
AST: 18 U/L (ref 15–41)
Albumin: 4 g/dL (ref 3.5–5.0)
Alkaline Phosphatase: 81 U/L (ref 38–126)
Anion gap: 7 (ref 5–15)
BUN: 17 mg/dL (ref 8–23)
CO2: 25 mmol/L (ref 22–32)
Calcium: 9.5 mg/dL (ref 8.9–10.3)
Chloride: 107 mmol/L (ref 98–111)
Creatinine, Ser: 0.65 mg/dL (ref 0.44–1.00)
GFR, Estimated: >60 mL/min (ref >60)
Glucose, Bld: 109 mg/dL — ABNORMAL HIGH (ref 70–99)
Potassium: 3.7 mmol/L (ref 3.5–5.1)
Sodium: 139 mmol/L (ref 135–145)
Total Bilirubin: 0.9 mg/dL (ref 0.3–1.2)
Total Protein: 7.2 g/dL (ref 6.5–8.1)

## 2023-09-03 LAB — CBC WITH DIFFERENTIAL/PLATELET
Abs Immature Granulocytes: 0.05 10*3/uL (ref 0.00–0.07)
Basophils Absolute: 0.1 10*3/uL (ref 0.0–0.1)
Basophils Relative: 1 %
Eosinophils Absolute: 0.5 10*3/uL (ref 0.0–0.5)
Eosinophils Relative: 5 %
HCT: 45.2 % (ref 36.0–46.0)
Hemoglobin: 14.7 g/dL (ref 12.0–15.0)
Immature Granulocytes: 1 %
Lymphocytes Relative: 35 %
Lymphs Abs: 3.3 10*3/uL (ref 0.7–4.0)
MCH: 28.2 pg (ref 26.0–34.0)
MCHC: 32.5 g/dL (ref 30.0–36.0)
MCV: 86.8 fL (ref 80.0–100.0)
Monocytes Absolute: 0.6 10*3/uL (ref 0.1–1.0)
Monocytes Relative: 7 %
Neutro Abs: 5 10*3/uL (ref 1.7–7.7)
Neutrophils Relative %: 51 %
Platelets: 220 10*3/uL (ref 150–400)
RBC: 5.21 MIL/uL — ABNORMAL HIGH (ref 3.87–5.11)
RDW: 14.2 % (ref 11.5–15.5)
WBC: 9.4 10*3/uL (ref 4.0–10.5)
nRBC: 0 % (ref 0.0–0.2)

## 2023-09-03 LAB — TSH: TSH: 2.524 u[IU]/mL (ref 0.350–4.500)

## 2023-09-03 MED ORDER — SODIUM CHLORIDE 0.9 % IV SOLN
Freq: Once | INTRAVENOUS | Status: AC
  Filled 2023-09-03: qty 250

## 2023-09-03 MED ORDER — HEPARIN SOD (PORK) LOCK FLUSH 100 UNIT/ML IV SOLN
500.0000 [IU] | Freq: Once | INTRAVENOUS | Status: AC | PRN
  Administered 2023-09-03: 500 [IU]
  Filled 2023-09-03: qty 5

## 2023-09-03 MED ORDER — SODIUM CHLORIDE 0.9 % IV SOLN
200.0000 mg | Freq: Once | INTRAVENOUS | Status: AC
  Administered 2023-09-03: 200 mg via INTRAVENOUS
  Filled 2023-09-03: qty 200

## 2023-09-03 MED ORDER — SODIUM CHLORIDE 0.9% FLUSH
10.0000 mL | INTRAVENOUS | Status: DC | PRN
  Administered 2023-09-03: 10 mL
  Filled 2023-09-03: qty 10

## 2023-09-03 NOTE — Patient Instructions (Signed)
[facility]  Discharge Instructions: Thank you for choosing [facility] to provide your oncology and hematology care.  If you have a lab appointment with the Cancer Center, please go directly to the Cancer Center and check in at the registration area.  Wear comfortable clothing and clothing appropriate for easy access to any Portacath or PICC line.   We strive to give you quality time with your provider. You may need to reschedule your appointment if you arrive late (15 or more minutes).  Arriving late affects you and other patients whose appointments are after yours.  Also, if you miss three or more appointments without notifying the office, you may be dismissed from the clinic at the provider's discretion.      For prescription refill requests, have your pharmacy contact our office and allow 72 hours for refills to be completed.    Today you received the following chemotherapy and/or immunotherapy agents- Keytruda      To help prevent nausea and vomiting after your treatment, we encourage you to take your nausea medication as directed.  BELOW ARE SYMPTOMS THAT SHOULD BE REPORTED IMMEDIATELY: *FEVER GREATER THAN 100.4 F (38 C) OR HIGHER *CHILLS OR SWEATING *NAUSEA AND VOMITING THAT IS NOT CONTROLLED WITH YOUR NAUSEA MEDICATION *UNUSUAL SHORTNESS OF BREATH *UNUSUAL BRUISING OR BLEEDING *URINARY PROBLEMS (pain or burning when urinating, or frequent urination) *BOWEL PROBLEMS (unusual diarrhea, constipation, pain near the anus) TENDERNESS IN MOUTH AND THROAT WITH OR WITHOUT PRESENCE OF ULCERS (sore throat, sores in mouth, or a toothache) UNUSUAL RASH, SWELLING OR PAIN  UNUSUAL VAGINAL DISCHARGE OR ITCHING   Items with * indicate a potential emergency and should be followed up as soon as possible or go to the Emergency Department if any problems should occur.  Please show the CHEMOTHERAPY ALERT CARD or IMMUNOTHERAPY ALERT CARD at check-in to  the Emergency Department and triage nurse.  Should you have questions after your visit or need to cancel or reschedule your appointment, please contact [facility]  [phone] and follow the prompts.  Office hours are 8:00 a.m. to 4:30 p.m. Monday - Friday. Please note that voicemails left after 4:00 p.m. may not be returned until the following business day.  We are closed weekends and major holidays. You have access to a nurse at all times for urgent questions. Please call the main number to the clinic [phone] and follow the prompts.  For any non-urgent questions, you may also contact your provider using MyChart. We now offer e-Visits for anyone 18 and older to request care online for non-urgent symptoms. For details visit [email or web address].   Also download the MyChart app! Go to the app store, search "MyChart", open the app, select [facility], and log in with your MyChart username and password.   

## 2023-09-03 NOTE — Progress Notes (Signed)
[facility] CONSULT NOTE  Patient Care Team: Malva Limes, MD as PCP - General (Family Medicine) Lady Gary Darlin Priestly, MD as Consulting Physician (Cardiology) Schnier, Latina Craver, MD (Vascular Surgery) Lonell Face, MD as Consulting Physician (Neurology) Delano Metz, MD as Referring Physician (Pain Medicine) Pa, [facility] (Optometry) Lady Gary, Darlin Priestly, MD as Consulting Physician (Cardiology) Pasty Spillers, MD (Inactive) as Consulting Physician (Gastroenterology) Nadara Mustard, MD as Referring Physician (Obstetrics and Gynecology) Glory Buff, RN as Oncology Nurse Navigator Earna Coder, MD as Consulting Physician (Oncology)   CHIEF COMPLAINTS/PURPOSE OF CONSULTATION: lung cancer  #  Oncology History Overview Note  #MAY 2022- LLL nodule 2.3 cm Adenocarcinoma Stage IA; s/p lobectomy.  No adjuvant therapy  # CT scan 4th June 2023-highly concerning for recurrent/metastatic disease with-. New left adrenal metastasis;  Ground-glass and part solid nodules in the peripheral right lower lobe, unchanged from 11/07/2021 but slightly enlarged from 01/01/2018. Findings are suspicious for indolent adenocarcinoma.  F One- PFL1 =80%; KRAS G12C PET scan JUNE 20th- 2023-  Enlarged 4 cm hypermetabolic LEFT adrenal metastasis;  Suspicion of metastatic adenopathy to the LEFT hilum.  Part solid nodule in the RIGHT lower lobe without metabolic activity.   3 ADRENAL BIOPSY- CK7 POSITIVE ADENOCARCINOMA- There is limited tissue remaining for ancillary testing, not likely  sufficient for NGS testing.   # AUG 7th, 2023-single agent Keytruda.    Primary cancer of left lower lobe of lung (HCC)  05/09/2021 Initial Diagnosis   Primary cancer of left lower lobe of lung (HCC)   07/04/2022 -  Chemotherapy   Patient is on Treatment Plan : LUNG NSCLC Pembrolizumab (200) q21d     07/10/2022 - 07/10/2022 Chemotherapy   Patient is on Treatment Plan : LUNG NSCLC Pembrolizumab  (200) q21d      HISTORY OF PRESENTING ILLNESS: Patient  ambulating.  Alone.   Laura Nielsen 67 y.o.  female history of smoking; prior history of lung cancer-with likely recurrence to left adrenal gland [s/p Bx CK-7 positive TTF-1 negative]-clinically suggestive of recurrent lung cancer on single agent Laura Nielsen is here for follow-up.   S/p evaluation with vascular. Awaiting CT neck for evaluation of vascular occulusion in neck.   Fatigue, shortness of breath sometimes, and her ankles are still bothering her.   Currently s/p cardiology and pulmonary appt.    Denies any abdominal pain. Otherwise patient continues to have mild shortness of breath on exertion.  Denies any nausea vomiting. Complains of cramping in the legs chronic.   Review of Systems  Constitutional:  Negative for chills, diaphoresis, fever, malaise/fatigue and weight loss.  HENT:  Negative for nosebleeds and sore throat.   Eyes:  Negative for double vision.  Respiratory:  Negative for cough, hemoptysis, sputum production, shortness of breath and wheezing.   Cardiovascular:  Negative for palpitations, orthopnea and leg swelling.  Gastrointestinal:  Negative for abdominal pain, blood in stool, constipation, diarrhea, heartburn, melena, nausea and vomiting.  Genitourinary:  Negative for dysuria, frequency and urgency.  Musculoskeletal:  Positive for back pain and joint pain.  Skin: Negative.  Negative for itching and rash.  Neurological:  Positive for tingling. Negative for dizziness, focal weakness, weakness and headaches.  Endo/Heme/Allergies:  Does not bruise/bleed easily.  Psychiatric/Behavioral:  Negative for depression. The patient is not nervous/anxious and does not have insomnia.      MEDICAL HISTORY:  Past Medical History:  Diagnosis Date   AAA (abdominal aortic aneurysm) (HCC)    s/p EVAR  AAA 03/27/13   Arthritis    Asthma    Cancer (HCC)    Centrilobular emphysema (HCC)    Complication of anesthesia    BP  drops after   Coronary artery disease    History of kidney stones    Osteoporosis    Primary cancer of left lower lobe of lung (HCC)    Sleep apnea     SURGICAL HISTORY: Past Surgical History:  Procedure Laterality Date   ABDOMINAL AORTIC ENDOVASCULAR STENT GRAFT  03/27/2013   Dr. Levora Dredge   Cardiac catheterization  02/2009   70-80% stenosis RCA stent placed. started on Plavix   CARDIAC CATHETERIZATION     CATARACT EXTRACTION W/PHACO Left 05/14/2023   Procedure: CATARACT EXTRACTION PHACO AND INTRAOCULAR LENS PLACEMENT (IOC) LEFT DIABETIC  OMIDRIA  19.40  01:34.8;  Surgeon: Estanislado Pandy, MD;  Location: [facility];  Service: Ophthalmology;  Laterality: Left;   CATARACT EXTRACTION W/PHACO Right 06/27/2023   Procedure: CATARACT EXTRACTION PHACO AND INTRAOCULAR LENS PLACEMENT (IOC) RIGHT DIABETIC  6.47  00:42.6;  Surgeon: Estanislado Pandy, MD;  Location: [facility];  Service: Ophthalmology;  Laterality: Right;   COLONOSCOPY WITH PROPOFOL N/A 01/05/2021   Procedure: COLONOSCOPY WITH PROPOFOL;  Surgeon: Pasty Spillers, MD;  Location: [facility];  Service: Endoscopy;  Laterality: N/A;   CORONARY ANGIOPLASTY     stent placed   ESOPHAGOGASTRODUODENOSCOPY (EGD) WITH PROPOFOL N/A 01/05/2021   Procedure: ESOPHAGOGASTRODUODENOSCOPY (EGD) WITH PROPOFOL;  Surgeon: Pasty Spillers, MD;  Location: [facility];  Service: Endoscopy;  Laterality: N/A;   INTERCOSTAL NERVE BLOCK Left 04/06/2021   Procedure: INTERCOSTAL NERVE BLOCK;  Surgeon: Loreli Slot, MD;  Location: [facility] OR;  Service: Thoracic;  Laterality: Left;   IR IMAGING GUIDED PORT INSERTION  06/20/2022   KIDNEY STONE SURGERY  1999   LUNG LOBECTOMY Left 04/06/2021   robotic left lower lobectomy 04/06/2021 Dr. Dorris Fetch for Stage 1A adenocarcinoma   NODE DISSECTION Left 04/06/2021   Procedure: NODE DISSECTION;  Surgeon: Loreli Slot, MD;  Location: [facility] OR;  Service: Thoracic;   Laterality: Left;   TUBAL LIGATION     VAGINAL HYSTERECTOMY     Menometrorrhagia. Excessive bleeding. Unknown if cervix removed.     SOCIAL HISTORY: Social History   Socioeconomic History   Marital status: Divorced    Spouse name: Not on file   Number of children: 2   Years of education: H/S   Highest education level: High school graduate  Occupational History   Occupation: Disabled   Occupation: retired  Tobacco Use   Smoking status: Every Day    Current packs/day: 1.00    Average packs/day: 0.7 packs/day for 96.2 years (62.9 ttl pk-yrs)    Types: Cigarettes    Start date: 1973   Smokeless tobacco: Never   Tobacco comments:    12/23/19 states she quit for 9 months and then started back.   Vaping Use   Vaping status: Never Used  Substance and Sexual Activity   Alcohol use: Yes    Comment: rarely - once a year   Drug use: No   Sexual activity: Not on file  Other Topics Concern   Not on file  Social History Narrative   Hx of smoking; quit prior to lung surgery then started back. Lives in [place] alone. Used to work in [employer], [employer]- Facilities manager. On disability sec to spinal pain.    Social Determinants of Health   Financial Resource Strain: Medium Risk (02/16/2023)  Overall Financial Resource Strain (CARDIA)    Difficulty of Paying Living Expenses: Somewhat hard  Food Insecurity: Patient Declined (02/16/2023)   Hunger Vital Sign    Worried About Running Out of Food in the Last Year: Patient declined    Ran Out of Food in the Last Year: Patient declined  Transportation Needs: No Transportation Needs (02/16/2023)   PRAPARE - Administrator, Civil Service (Medical): No    Lack of Transportation (Non-Medical): No  Physical Activity: Unknown (02/16/2023)   Exercise Vital Sign    Days of Exercise per Week: 0 days    Minutes of Exercise per Session: Patient declined  Stress: Stress Concern Present (02/16/2023)   [employer] of Occupational Health -  Occupational Stress Questionnaire    Feeling of Stress : Rather much  Social Connections: Unknown (02/16/2023)   Social Connection and Isolation Panel [NHANES]    Frequency of Communication with Friends and Family: More than three times a week    Frequency of Social Gatherings with Friends and Family: Once a week    Attends Religious Services: Not on Insurance claims handler of Clubs or Organizations: No    Attends Banker Meetings: Never    Marital Status: Patient declined  Intimate Partner Violence: Not At Risk (02/20/2023)   Humiliation, Afraid, Rape, and Kick questionnaire    Fear of Current or Ex-Partner: No    Emotionally Abused: No    Physically Abused: No    Sexually Abused: No    FAMILY HISTORY: Family History  Problem Relation Age of Onset   Hypertension Mother    Coronary artery disease Mother    Heart attack Mother        acute   Cancer Mother    Alcohol abuse Father    Depression Father    Hypertension Father    Heart attack Father 76       acute   Alcohol abuse Sister    Hyperlipidemia Sister    Hypertension Sister    Cancer Sister 42   Heart attack Sister        x's 2   Coronary artery disease Sister 77       x's 2   Breast cancer Sister 36    ALLERGIES:  is allergic to atorvastatin, omeprazole, and bupropion.  MEDICATIONS:  Current Outpatient Medications  Medication Sig Dispense Refill   allopurinol (ZYLOPRIM) 100 MG tablet Take 1 tablet (100 mg total) by mouth daily. 90 tablet 3   aspirin 81 MG tablet Take 81 mg by mouth daily.      bisoprolol (ZEBETA) 10 MG tablet Take 1 tablet (10 mg total) by mouth daily. 90 tablet 1   Budeson-Glycopyrrol-Formoterol (BREZTRI AEROSPHERE) 160-9-4.8 MCG/ACT AERO Inhale 2 puffs into the lungs in the morning and at bedtime. 1284 g 11   Cholecalciferol 25 MCG (1000 UT) capsule Take 1,000 Units by mouth daily.     clopidogrel (PLAVIX) 75 MG tablet Take 1 tablet (75 mg total) by mouth daily. 90 tablet 4    diazepam (VALIUM) 5 MG tablet Take 1 tablet (5 mg total) by mouth every 12 (twelve) hours as needed. 30 tablet 0   fluticasone (FLONASE) 50 MCG/ACT nasal spray Place into both nostrils daily.     HYDROcodone-acetaminophen (NORCO/VICODIN) 5-325 MG tablet Take 1-2 tablets by mouth every 6 (six) hours as needed for moderate pain. 20 tablet 0   ibuprofen (ADVIL) 200 MG tablet Take 400 mg by mouth 2 (two)  times daily as needed (headaches).     lidocaine-prilocaine (EMLA) cream Apply on the port. 30 -45 min  prior to port access. 30 g 3   lisinopril-hydrochlorothiazide (ZESTORETIC) 20-12.5 MG tablet Take 0.5 tablets by mouth daily.     magnesium gluconate (MAGONATE) 500 MG tablet Take 500 mg by mouth daily. At bedtime     pantoprazole (PROTONIX) 20 MG tablet Take 1 tablet (20 mg total) by mouth daily. 90 tablet 1   PROAIR HFA 108 (90 Base) MCG/ACT inhaler Inhale 2 puffs into the lungs every 6 (six) hours as needed for wheezing or shortness of breath. 8.5 g 4   rosuvastatin (CRESTOR) 20 MG tablet Take 1 tablet (20 mg total) by mouth daily. 90 tablet 1   Semaglutide, 1 MG/DOSE, (OZEMPIC, 1 MG/DOSE,) 4 MG/3ML SOPN Inject 1 mg into the skin once a week. 9 mL 1   No current facility-administered medications for this visit.   Facility-Administered Medications Ordered in Other Visits  Medication Dose Route Frequency Provider Last Rate Last Admin   heparin lock flush 100 UNIT/ML injection            heparin lock flush 100 unit/mL  500 Units Intracatheter Once PRN Earna Coder, MD       pembrolizumab [facility]) 200 mg in sodium chloride 0.9 % 50 mL chemo infusion  200 mg Intravenous Once Louretta Shorten R, MD       sodium chloride flush (NS) 0.9 % injection 10 mL  10 mL Intracatheter PRN Earna Coder, MD        Mild maculopapular rash on the Maller area left more than right.  PHYSICAL EXAMINATION: ECOG PERFORMANCE STATUS: 1 - Symptomatic but completely ambulatory  Vitals:    09/03/23 0836  BP: 127/86  Pulse: 72  Temp: 97.7 F (36.5 C)  SpO2: 100%      Filed Weights   09/03/23 0836  Weight: 200 lb (90.7 kg)       Physical Exam HENT:     Head: Normocephalic and atraumatic.     Mouth/Throat:     Pharynx: No oropharyngeal exudate.  Eyes:     Pupils: Pupils are equal, round, and reactive to light.  Cardiovascular:     Rate and Rhythm: Normal rate and regular rhythm.  Pulmonary:     Comments: Decreased breath sounds bilaterally.  No wheeze or crackles Abdominal:     General: Bowel sounds are normal. There is no distension.     Palpations: Abdomen is soft. There is no mass.     Tenderness: There is no abdominal tenderness. There is no guarding or rebound.  Musculoskeletal:        General: No tenderness. Normal range of motion.     Cervical back: Normal range of motion and neck supple.  Skin:    General: Skin is warm.  Neurological:     Mental Status: She is alert and oriented to person, place, and time.  Psychiatric:        Mood and Affect: Affect normal.      LABORATORY DATA:  I have reviewed the data as listed Lab Results  Component Value Date   WBC 9.4 09/03/2023   HGB 14.7 09/03/2023   HCT 45.2 09/03/2023   MCV 86.8 09/03/2023   PLT 220 09/03/2023   Recent Labs    07/23/23 0805 08/13/23 0818 09/03/23 0824  NA 136 137 139  K 3.7 3.7 3.7  CL 106 106 107  CO2 24 24 25  GLUCOSE 93 99 109*  BUN 21 20 17   CREATININE 0.67 0.66 0.65  CALCIUM 9.4 9.3 9.5  GFRNONAA >60 >60 >60  PROT 7.0 7.0 7.2  ALBUMIN 4.0 4.0 4.0  AST 24 21 18   ALT 16 17 15   ALKPHOS 87 86 81  BILITOT 0.7 0.7 0.9    RADIOGRAPHIC STUDIES: I have personally reviewed the radiological images as listed and agreed with the findings in the report. No results found.   ASSESSMENT & PLAN:   Primary cancer of left lower lobe of lung (HCC) #JUNE 2023-STAGE IV--recurrent/metastatic disease with New left adrenal metastasis;  PET scan JUNE 20th- 2023-  Enlarged  4 cm hypermetabolic LEFT adrenal metastasis;  Suspicion of metastatic adenopathy to the LEFT hilum.  Part solid nodule in the RIGHT lower lobe without metabolic activity. 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 Currently on single agent Keytruda [PD-L1 greater than 80% ].    # # CT scan- June 13th, 2024- Status post left lower lobectomy. No evidence of recurrent or metastatic disease in the chest; Unchanged small ground-glass nodules in the right lower lobe, including a 1.0 x 0.8 cm nodule in the dependent right lower lobe- stable.   # Proceed of Keytruda single agent. Labs today reviewed;  acceptable for treatment today. Tolerating well except for mild chills. TSH- MAY 2024- WNL-stable. Will plan to get CT scan at next visit. Will give a break from immunotherapy given the upcoming gyn surgery.   # Right mildly complex cystic adnexal mass noted-June 13th, 2024- Significant interval enlargement of a multiseptated cystic lesion of the right ovary, now measuring 11.7 x 8.2 cm, previously 8.1 x 5.8 cm. 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  S/p  cardiac /pulmonary- PFTs/ stress test- stable- awaiting surgery in [facility] in end of 24th October, 2024.   # mild Hypokalemia: discussed dietary supplement. stable  # Chronic pain: headaches/cervical pain/back pain [Dr.Naveira; pain doctor]  On NSAIDs [ monitor for now. OCT 2023- MRI brain- NEGATIVE for any metastatic disease.   Stable.  FEB 2024- MRI lumbar spine- degenerative disease- on ibuprofen prn- BID.  stable  # Left rib pain-Post throacotomy pain/tingling and numbness:  Continue gabapentin -300 mg TID; and then at extra at night prn. stable  # CAD/ PVD- cramping- s/p evaluation [Dr.Schneir]; ? Carotid occlusion- SEP 13th CT scan neck- pending.   # COPD-stable encouraged continue to avoid smoking; again counseled to quit smoking; s/p  pulmonary; awaiting PFTs- stable  # IV Access :s/p  port placement stable  *AM appts- Monday-   Oct 24th-sx # DISPOSITION:  # Laura Nielsen today; # Follow-up in nov 18th, -MD labs/port- cbc/cmp;TSH; Laura Nielsen -  Dr. Fabio Neighbors, MD 09/03/2023 9:30 AM

## 2023-09-03 NOTE — Progress Notes (Signed)
Having ovary taken out 09/27/23 by Dr. Sonia Side. She would like to know when you will stop treatment for the surgery.

## 2023-09-03 NOTE — Assessment & Plan Note (Addendum)
#  JUNE 2023-STAGE IV--recurrent/metastatic disease with New left adrenal metastasis;  PET scan JUNE 20th- 2023-  Enlarged 4 cm hypermetabolic LEFT adrenal metastasis;  Suspicion of metastatic adenopathy to the LEFT hilum.  Part solid nodule in the RIGHT lower lobe without metabolic activity. 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 Currently on single agent Keytruda [PD-L1 greater than 80% ].    # # CT scan- June 13th, 2024- Status post left lower lobectomy. No evidence of recurrent or metastatic disease in the chest; Unchanged small ground-glass nodules in the right lower lobe, including a 1.0 x 0.8 cm nodule in the dependent right lower lobe- stable.   # Proceed of Keytruda single agent. Labs today reviewed;  acceptable for treatment today. Tolerating well except for mild chills. TSH- MAY 2024- WNL-stable. Will plan to get CT scan at next visit. Will give a break from immunotherapy given the upcoming gyn surgery.   # Right mildly complex cystic adnexal mass noted-June 13th, 2024- Significant interval enlargement of a multiseptated cystic lesion of the right ovary, now measuring 11.7 x 8.2 cm, previously 8.1 x 5.8 cm. 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  S/p  cardiac /pulmonary- PFTs/ stress test- stable- awaiting surgery in [facility] in end of 24th October, 2024.   # mild Hypokalemia: discussed dietary supplement. stable  # Chronic pain: headaches/cervical pain/back pain [Dr.Naveira; pain doctor]  On NSAIDs [ monitor for now. OCT 2023- MRI brain- NEGATIVE for any metastatic disease.   Stable.  FEB 2024- MRI lumbar spine- degenerative disease- on ibuprofen prn- BID.  stable  # Left rib pain-Post throacotomy pain/tingling and numbness:  Continue gabapentin -300 mg TID; and then at extra at night prn. stable  # CAD/ PVD- cramping- s/p evaluation [Dr.Schneir]; ? Carotid occlusion- SEP  13th CT scan neck- pending.   # COPD-stable encouraged continue to avoid smoking; again counseled to quit smoking; s/p pulmonary; awaiting PFTs- stable  # IV Access :s/p  port placement stable  *AM appts- Monday-   Oct 24th-sx # DISPOSITION:  # Rande Lawman today; # Follow-up in nov 18th, -MD labs/port- cbc/cmp;TSH; Rande Lawman -  Dr. Leonard Schwartz

## 2023-09-04 ENCOUNTER — Other Ambulatory Visit: Payer: Self-pay | Admitting: Family Medicine

## 2023-09-04 DIAGNOSIS — E7800 Pure hypercholesterolemia, unspecified: Secondary | ICD-10-CM

## 2023-09-04 LAB — T4: T4, Total: 7.6 ug/dL (ref 4.5–12.0)

## 2023-09-05 NOTE — Telephone Encounter (Signed)
Requested Prescriptions  Pending Prescriptions Disp Refills   rosuvastatin (CRESTOR) 20 MG tablet [Pharmacy Med Name: ROSUVASTATIN CALCIUM 20 MG TAB] 90 tablet 0    Sig: Take 1 tablet (20 mg total) by mouth daily.     Cardiovascular:  Antilipid - Statins 2 Failed - 09/04/2023  3:13 PM      Failed - Lipid Panel in normal range within the last 12 months    Cholesterol, Total  Date Value Ref Range Status  02/13/2023 150 100 - 199 mg/dL Final   LDL Chol Calc (NIH)  Date Value Ref Range Status  02/13/2023 82 0 - 99 mg/dL Final   HDL  Date Value Ref Range Status  02/13/2023 42 >39 mg/dL Final   Triglycerides  Date Value Ref Range Status  02/13/2023 150 (H) 0 - 149 mg/dL Final         Passed - Cr in normal range and within 360 days    Creatinine  Date Value Ref Range Status  03/05/2023 0.64 0.44 - 1.00 mg/dL Final   Creat  Date Value Ref Range Status  08/30/2017 0.60 0.50 - 0.99 mg/dL Final    Comment:    For patients >58 years of age, the reference limit for Creatinine is approximately 13% higher for people identified as African-American. .    Creatinine, Ser  Date Value Ref Range Status  09/03/2023 0.65 0.44 - 1.00 mg/dL Final   Creatinine, POC  Date Value Ref Range Status  12/12/2017 n/a mg/dL Final         Passed - Patient is not pregnant      Passed - Valid encounter within last 12 months    Recent Outpatient Visits           3 weeks ago Primary hypertension   [place] [facility] Malva Limes, MD   6 months ago Type 2 diabetes mellitus without complication, without long-term current use of insulin (HCC)   [place] [facility] Malva Limes, MD   1 year ago Type 2 diabetes mellitus without complication, without long-term current use of insulin (HCC)   [place] [facility] Malva Limes, MD   1 year ago Type 2 diabetes mellitus without complication, without long-term current use of insulin (HCC)    [place] [facility] Malva Limes, MD   1 year ago Acute cough   [place] [facility] Mecum, Oswaldo Conroy, PA-C       Future Appointments             In 2 months Fisher, Demetrios Isaacs, MD  [facility]             lisinopril-hydrochlorothiazide (ZESTORETIC) 20-12.5 MG tablet [Pharmacy Med Name: LISINOPRIL-HCTZ 20-12.5 MG TAB] 90 tablet 0    Sig: Take 1 tablet by mouth daily.     Cardiovascular:  ACEI + Diuretic Combos Passed - 09/04/2023  3:13 PM      Passed - Na in normal range and within 180 days    Sodium  Date Value Ref Range Status  09/03/2023 139 135 - 145 mmol/L Final  02/13/2022 142 134 - 144 mmol/L Final  03/28/2013 139 136 - 145 mmol/L Final         Passed - K in normal range and within 180 days    Potassium  Date Value Ref Range Status  09/03/2023 3.7 3.5 - 5.1 mmol/L Final  03/28/2013 3.7 3.5 - 5.1  mmol/L Final         Passed - Cr in normal range and within 180 days    Creatinine  Date Value Ref Range Status  03/05/2023 0.64 0.44 - 1.00 mg/dL Final   Creat  Date Value Ref Range Status  08/30/2017 0.60 0.50 - 0.99 mg/dL Final    Comment:    For patients >49 years of age, the reference limit for Creatinine is approximately 13% higher for people identified as African-American. .    Creatinine, Ser  Date Value Ref Range Status  09/03/2023 0.65 0.44 - 1.00 mg/dL Final   Creatinine, POC  Date Value Ref Range Status  12/12/2017 n/a mg/dL Final         Passed - eGFR is 30 or above and within 180 days    GFR, Est African American  Date Value Ref Range Status  08/30/2017 115 > OR = 60 mL/min/1.87m2 Final   GFR calc Af Amer  Date Value Ref Range Status  01/25/2021 93 >59 mL/min/1.73 Final    Comment:    **In accordance with recommendations from the NKF-ASN Task force,**   [facility] is in the process of updating its eGFR calculation to the   2021 CKD-EPI creatinine equation that estimates  kidney function   without a race variable.    GFR, Est Non African American  Date Value Ref Range Status  08/30/2017 99 > OR = 60 mL/min/1.74m2 Final   GFR, Estimated  Date Value Ref Range Status  09/03/2023 >60 >60 mL/min Final    Comment:    (NOTE) Calculated using the CKD-EPI Creatinine Equation (2021)   03/05/2023 >60 >60 mL/min Final    Comment:    (NOTE) Calculated using the CKD-EPI Creatinine Equation (2021)    eGFR  Date Value Ref Range Status  02/13/2022 94 >59 mL/min/1.73 Final         Passed - Patient is not pregnant      Passed - Last BP in normal range    BP Readings from Last 1 Encounters:  09/03/23 130/83         Passed - Valid encounter within last 6 months    Recent Outpatient Visits           3 weeks ago Primary hypertension   [place] [facility] Malva Limes, MD   6 months ago Type 2 diabetes mellitus without complication, without long-term current use of insulin (HCC)   [place] [facility] Malva Limes, MD   1 year ago Type 2 diabetes mellitus without complication, without long-term current use of insulin (HCC)   [place] [facility] Malva Limes, MD   1 year ago Type 2 diabetes mellitus without complication, without long-term current use of insulin (HCC)   [place] [facility] Malva Limes, MD   1 year ago Acute cough   [place] [facility] Mecum, Oswaldo Conroy, PA-C       Future Appointments             In 2 months Fisher, Demetrios Isaacs, MD [facility]             allopurinol (ZYLOPRIM) 100 MG tablet [Pharmacy Med Name: ALLOPURINOL 100 MG TABLET] 90 tablet 0    Sig: Take 1 tablet (100 mg total) by mouth daily.     Endocrinology:  Gout Agents - allopurinol Failed - 09/04/2023  3:13 PM  Failed - Uric Acid in normal range and within 360 days    Uric Acid, Serum  Date Value Ref Range Status  08/30/2017  5.2 2.5 - 7.0 mg/dL Final    Comment:    Therapeutic target for gout patients: <6.0 mg/dL .    Uric Acid  Date Value Ref Range Status  02/16/2016 6.8 2.5 - 7.1 mg/dL Final    Comment:               Therapeutic target for gout patients: <6.0         Failed - CBC within normal limits and completed in the last 12 months    WBC  Date Value Ref Range Status  09/03/2023 9.4 4.0 - 10.5 K/uL Final   RBC  Date Value Ref Range Status  09/03/2023 5.21 (H) 3.87 - 5.11 MIL/uL Final   Hemoglobin  Date Value Ref Range Status  09/03/2023 14.7 12.0 - 15.0 g/dL Final  29/56/2130 86.5 12.0 - 15.0 g/dL Final  78/46/9629 52.8 11.1 - 15.9 g/dL Final   HCT  Date Value Ref Range Status  09/03/2023 45.2 36.0 - 46.0 % Final   Hematocrit  Date Value Ref Range Status  02/13/2022 47.7 (H) 34.0 - 46.6 % Final   MCHC  Date Value Ref Range Status  09/03/2023 32.5 30.0 - 36.0 g/dL Final   [facility]  Date Value Ref Range Status  09/03/2023 28.2 26.0 - 34.0 pg Final   MCV  Date Value Ref Range Status  09/03/2023 86.8 80.0 - 100.0 fL Final  02/13/2022 84 79 - 97 fL Final  03/28/2013 88 80 - 100 fL Final   No results found for: "PLTCOUNTKUC", "LABPLAT", "POCPLA" RDW  Date Value Ref Range Status  09/03/2023 14.2 11.5 - 15.5 % Final  02/13/2022 14.0 11.7 - 15.4 % Final  03/28/2013 13.9 11.5 - 14.5 % Final         Passed - Cr in normal range and within 360 days    Creatinine  Date Value Ref Range Status  03/05/2023 0.64 0.44 - 1.00 mg/dL Final   Creat  Date Value Ref Range Status  08/30/2017 0.60 0.50 - 0.99 mg/dL Final    Comment:    For patients >20 years of age, the reference limit for Creatinine is approximately 13% higher for people identified as African-American. .    Creatinine, Ser  Date Value Ref Range Status  09/03/2023 0.65 0.44 - 1.00 mg/dL Final   Creatinine, POC  Date Value Ref Range Status  12/12/2017 n/a mg/dL Final         Passed - Valid encounter within last 12  months    Recent Outpatient Visits           3 weeks ago Primary hypertension   [place] [facility] Malva Limes, MD   6 months ago Type 2 diabetes mellitus without complication, without long-term current use of insulin (HCC)   [place] [facility] Malva Limes, MD   1 year ago Type 2 diabetes mellitus without complication, without long-term current use of insulin (HCC)   [place] [facility] Malva Limes, MD   1 year ago Type 2 diabetes mellitus without complication, without long-term current use of insulin (HCC)   [place] [facility] Malva Limes, MD   1 year ago Acute cough   [place] [facility] Mecum, Oswaldo Conroy, [place]       Future Appointments  In 2 months Fisher, Demetrios Isaacs, MD [facility]

## 2023-09-07 DIAGNOSIS — Z01818 Encounter for other preprocedural examination: Secondary | ICD-10-CM | POA: Diagnosis not present

## 2023-09-07 DIAGNOSIS — I10 Essential (primary) hypertension: Secondary | ICD-10-CM | POA: Diagnosis not present

## 2023-09-07 DIAGNOSIS — I714 Abdominal aortic aneurysm, without rupture, unspecified: Secondary | ICD-10-CM | POA: Diagnosis not present

## 2023-09-07 DIAGNOSIS — I2510 Atherosclerotic heart disease of native coronary artery without angina pectoris: Secondary | ICD-10-CM | POA: Diagnosis not present

## 2023-09-07 DIAGNOSIS — N9489 Other specified conditions associated with female genital organs and menstrual cycle: Secondary | ICD-10-CM | POA: Diagnosis not present

## 2023-09-07 DIAGNOSIS — E876 Hypokalemia: Secondary | ICD-10-CM | POA: Diagnosis not present

## 2023-09-07 DIAGNOSIS — Z720 Tobacco use: Secondary | ICD-10-CM | POA: Diagnosis not present

## 2023-09-07 DIAGNOSIS — J439 Emphysema, unspecified: Secondary | ICD-10-CM | POA: Diagnosis not present

## 2023-09-19 NOTE — Progress Notes (Unsigned)
MRN : [id number]  Laura Nielsen is a 67 y.o. (1956-09-06) female who presents with chief complaint of check carotid arteries.  History of Present Illness:   The patient is seen for follow up evaluation of carotid stenosis status post CT angiogram. CT scan was done 08/17/2023.  Patient reports that the test went well with no problems or complications.   The patient denies interval amaurosis fugax. There is no recent or interval TIA symptoms or focal motor deficits. There is no prior documented CVA.  The patient is taking enteric-coated aspirin 81 mg daily.  There is no history of migraine headaches. There is no history of seizures.  No recent shortening of the patient's walking distance or new symptoms consistent with claudication.  No history of rest pain symptoms. No new ulcers or wounds of the lower extremities have occurred.  There is no history of DVT, PE or superficial thrombophlebitis. No recent episodes of angina or shortness of breath documented.   CT angiogram dated 08/17/2023 is reviewed by me personally and shows 80% stenosis consistent with densely calcified plaque at the origin of the right internal carotid artery.  It is a densely calcified lesion without evidence for soft plaque or ulceration.  Previous noninvasive studies: Duplex ultrasound shows a patent stent graft no endoleak and the sac is 2.81 cm; previous study measured 2.8 cm.   Carotid duplex shows RICA 80-99% and LICA 1-39%  stenosis (increase on the right)   ABI Rt=1.08 and Lt=1.09 triphasic signals bilaterally.  (Previous ABI Rt=1.11 and Lt=1.12 triphasic signals bilaterally)  No outpatient medications have been marked as taking for the 09/20/23 encounter (Appointment) with Gilda Crease, Latina Craver, MD.    Past Medical History:  Diagnosis Date   AAA (abdominal aortic aneurysm) (HCC)    s/p EVAR AAA 03/27/13   Arthritis    Asthma    Cancer (HCC)    Centrilobular emphysema (HCC)     Complication of anesthesia    BP drops after   Coronary artery disease    History of kidney stones    Osteoporosis    Primary cancer of left lower lobe of lung (HCC)    Sleep apnea     Past Surgical History:  Procedure Laterality Date   ABDOMINAL AORTIC ENDOVASCULAR STENT GRAFT  03/27/2013   Dr. Levora Dredge   Cardiac catheterization  02/2009   70-80% stenosis RCA stent placed. started on Plavix   CARDIAC CATHETERIZATION     CATARACT EXTRACTION W/PHACO Left 05/14/2023   Procedure: CATARACT EXTRACTION PHACO AND INTRAOCULAR LENS PLACEMENT (IOC) LEFT DIABETIC  OMIDRIA  19.40  01:34.8;  Surgeon: Estanislado Pandy, MD;  Location: [facility];  Service: Ophthalmology;  Laterality: Left;   CATARACT EXTRACTION W/PHACO Right 06/27/2023   Procedure: CATARACT EXTRACTION PHACO AND INTRAOCULAR LENS PLACEMENT (IOC) RIGHT DIABETIC  6.47  00:42.6;  Surgeon: Estanislado Pandy, MD;  Location: [facility];  Service: Ophthalmology;  Laterality: Right;   COLONOSCOPY WITH PROPOFOL N/A 01/05/2021   Procedure: COLONOSCOPY WITH PROPOFOL;  Surgeon: Pasty Spillers, MD;  Location: [facility];  Service: Endoscopy;  Laterality: N/A;   CORONARY ANGIOPLASTY     stent placed   ESOPHAGOGASTRODUODENOSCOPY (EGD) WITH PROPOFOL N/A 01/05/2021   Procedure: ESOPHAGOGASTRODUODENOSCOPY (EGD) WITH PROPOFOL;  Surgeon: Pasty Spillers, MD;  Location: [facility] END[facility];  Service: Endoscopy;  Laterality: N/A;   INTERCOSTAL  NERVE BLOCK Left 04/06/2021   Procedure: INTERCOSTAL NERVE BLOCK;  Surgeon: Loreli Slot, MD;  Location[facility];  Service: Thoracic;  Laterality: Left;   IR IMAGING GUIDED PORT INSERTION  06/20/2022   KIDNEY STONE SURGERY  1999   LUNG LOBECTOMY Left 04/06/2021   robotic left lower lobectomy 04/06/2021 Dr. Dorris Fetch for Stage 1A adenocarcinoma   NODE DISSECTION Left 04/06/2021   Procedure: NODE DISSECTION;  Surgeon: Loreli Slot, MD;  Location: [facility] OR;   Service: Thoracic;  Laterality: Left;   TUBAL LIGATION     VAGINAL HYSTERECTOMY     Menometrorrhagia. Excessive bleeding. Unknown if cervix removed.     Social History Social History   Tobacco Use   Smoking status: Every Day    Current packs/day: 1.00    Average packs/day: 0.7 packs/day for 96.3 years (62.9 ttl pk-yrs)    Types: Cigarettes    Start date: 1973   Smokeless tobacco: Never   Tobacco comments:    12/23/19 states she quit for 9 months and then started back.   Vaping Use   Vaping status: Never Used  Substance Use Topics   Alcohol use: Yes    Comment: rarely - once a year   Drug use: No    Family History Family History  Problem Relation Age of Onset   Hypertension Mother    Coronary artery disease Mother    Heart attack Mother        acute   Cancer Mother    Alcohol abuse Father    Depression Father    Hypertension Father    Heart attack Father 53       acute   Alcohol abuse Sister    Hyperlipidemia Sister    Hypertension Sister    Cancer Sister 13   Heart attack Sister        x's 2   Coronary artery disease Sister 31       x's 2   Breast cancer Sister 45    Allergies  Allergen Reactions   Atorvastatin Other (See Comments)    Elevated blood sugar    Omeprazole Nausea And Vomiting   Bupropion Nausea Only    Only on the 150mg  tablets, but the 100mg  didn't help with smoking     REVIEW OF SYSTEMS (Negative unless checked)  Constitutional: [] Weight loss  [] Fever  [] Chills Cardiac: [] Chest pain   [] Chest pressure   [] Palpitations   [] Shortness of breath when laying flat   [] Shortness of breath with exertion. Vascular:  [x] Pain in legs with walking   [] Pain in legs at rest  [] History of DVT   [] Phlebitis   [] Swelling in legs   [] Varicose veins   [] Non-healing ulcers Pulmonary:   [] Uses home oxygen   [] Productive cough   [] Hemoptysis   [] Wheeze  [] COPD   [] Asthma Neurologic:  [] Dizziness   [] Seizures   [] History of stroke   [] History of TIA   [] Aphasia   [] Vissual changes   [] Weakness or numbness in arm   [] Weakness or numbness in leg Musculoskeletal:   [] Joint swelling   [] Joint pain   [] Low back pain Hematologic:  [] Easy bruising  [] Easy bleeding   [] Hypercoagulable state   [] Anemic Gastrointestinal:  [] Diarrhea   [] Vomiting  [] Gastroesophageal reflux/heartburn   [] Difficulty swallowing. Genitourinary:  [] Chronic kidney disease   [] Difficult urination  [] Frequent urination   [] Blood in urine Skin:  [] Rashes   [] Ulcers  Psychological:  [] History of anxiety   []  History of major depression.  Physical Examination  There were no vitals filed for this visit. There is no height or weight on file to calculate BMI. Gen: WD/WN, NAD Head: Grill/AT, No temporalis wasting.  Ear/Nose/Throat: Hearing grossly intact, nares w/o erythema or drainage Eyes: PER, EOMI, sclera nonicteric.  Neck: Supple, no masses.  No bruit or JVD.  Pulmonary:  Good air movement, no audible wheezing, no use of accessory muscles.  Cardiac: RRR, normal S1, S2, no Murmurs. Vascular:  carotid bruit noted Vessel Right Left  Radial Palpable Palpable  Carotid  Palpable  Palpable  Subclav  Palpable Palpable  Gastrointestinal: soft, non-distended. No guarding/no peritoneal signs.  Musculoskeletal: M/S 5/5 throughout.  No visible deformity.  Neurologic: CN 2-12 intact. Pain and light touch intact in extremities.  Symmetrical.  Speech is fluent. Motor exam as listed above. Psychiatric: Judgment intact, Mood & affect appropriate for pt's clinical situation. Dermatologic: No rashes or ulcers noted.  No changes consistent with cellulitis.   CBC Lab Results  Component Value Date   WBC 9.4 09/03/2023   HGB 14.7 09/03/2023   HCT 45.2 09/03/2023   MCV 86.8 09/03/2023   PLT 220 09/03/2023    BMET    Component Value Date/Time   NA 139 09/03/2023 0824   NA 142 02/13/2022 1027   NA 139 03/28/2013 0354   K 3.7 09/03/2023 0824   K 3.7 03/28/2013 1811   CL 107  09/03/2023 0824   CL 107 03/28/2013 0354   CO2 25 09/03/2023 0824   CO2 27 03/28/2013 0354   GLUCOSE 109 (H) 09/03/2023 0824   GLUCOSE 112 (H) 03/28/2013 0354   BUN 17 09/03/2023 0824   BUN 15 02/13/2022 1027   BUN 8 03/28/2013 0354   CREATININE 0.65 09/03/2023 0824   CREATININE 0.64 03/05/2023 0947   CREATININE 0.60 08/30/2017 0920   CALCIUM 9.5 09/03/2023 0824   CALCIUM 8.2 (L) 03/28/2013 0354   GFRNONAA >60 09/03/2023 0824   GFRNONAA >60 03/05/2023 0947   GFRNONAA 99 08/30/2017 0920   GFRAA 93 01/25/2021 1048   GFRAA 115 08/30/2017 0920   CrCl cannot be calculated (Unknown ideal weight.).  COAG Lab Results  Component Value Date   INR 1.0 06/20/2022   INR 1.0 07/12/2021   INR 1.0 04/04/2021    Radiology No results found.   Assessment/Plan 1. Bilateral carotid artery stenosis Recommend:  The patient remains asymptomatic with respect to the carotid stenosis.  However, the patient has now progressed and has a lesion the is >80%.  Patient's CT angiography of the carotid arteries confirms >80% right ICA stenosis.  The anatomical considerations support surgery over stenting.  This was discussed in detail with the patient.  The patient does indeed need surgery, therefore, cardiac clearance will be arranged. Once cleared the patient will be scheduled for surgery.  I have completed Shared Decision-Making with Abi Baas to carotid surgery/intervention. The conversation included: -Discussion of all treatment options including carotid endarterectomy (CEA), carotid artery stenting (which includes transcarotid artery revascularization (TCAR)), and optimal medical therapy (OMT). -Explanation of risks and benefits for each option specific to [employer] clinical situation. -Integration of clinical guidelines as it relates to the patient's history and comorbidities. -Discussion and incorporation of Yicel Sherlin and their personal preferences and priorities in choosing a  treatment plan plan  If the patient was unable to participate in Shared Decision Making this process was done with te patient.  The patient's NIHSS score is as follows: 0 Mild: 1 - 5 Mild to Moderately Severe:  5 - 14 Severe: 15 - 24 Very Severe: >25  The risks, benefits and alternative therapies were reviewed in detail with the patient.  All questions were answered.  The patient agrees to proceed with surgery of the right carotid artery.  I would proceed with oncologic surgery at this point given the characteristics of this lesion as well as the rather mild disease on the left side I believe the risks are more favorable than moving forward emergently with carotid surgery and then necessitating dual antiplatelet therapy and delaying for several months the ovarian surgery.  Continue antiplatelet therapy as prescribed. Continue management of CAD, HTN and Hyperlipidemia. Healthy heart diet, encouraged exercise at least 4 times per week.   2. Infrarenal abdominal aortic aneurysm (AAA) without rupture (HCC) Recommend: No surgery or intervention is indicated at this time.   The patient has an asymptomatic abdominal aortic aneurysm that is less than 4 cm in maximal diameter.     I have reviewed the natural history of abdominal aortic aneurysm and the small risk of rupture for aneurysm less than 5 cm in size.   However, as these small aneurysms tend to enlarge over time, continued surveillance with ultrasound or CT scan is mandatory.    I have also discussed optimizing medical management with hypertension and lipid control and the importance of abstinence from tobacco.  The patient is also encouraged to exercise a minimum of 30 minutes 4 times a week.    Should the patient develop new onset abdominal or back pain or signs of peripheral embolization they are instructed to seek medical attention immediately and to alert the physician providing care that they have an aneurysm.    The patient voices  their understanding.   The patient will return in 12 months with an aortic duplex.    3. Atherosclerosis of native artery of both lower extremities with intermittent claudication (HCC) Recommend:   The patient has evidence of atherosclerosis of the lower extremities with claudication.  The patient does not voice lifestyle limiting changes at this point in time.   Noninvasive studies do not suggest clinically significant change.   No invasive studies, angiography or surgery at this time The patient should continue walking and begin a more formal exercise program.  The patient should continue antiplatelet therapy and aggressive treatment of the lipid abnormalities   No changes in the patient's medications at this time   Continued surveillance is indicated as atherosclerosis is likely to progress with time.     The patient will continue follow up with noninvasive studies as ordered.   4. CAD in native artery Continue cardiac and antihypertensive medications as already ordered and reviewed, no changes at this time.  Continue statin as ordered and reviewed, no changes at this time  Nitrates PRN for chest pain  5. Primary hypertension Continue antihypertensive medications as already ordered, these medications have been reviewed and there are no changes at this time.    Levora Dredge, MD  09/19/2023 8:42 PM

## 2023-09-20 ENCOUNTER — Encounter (INDEPENDENT_AMBULATORY_CARE_PROVIDER_SITE_OTHER): Payer: Self-pay | Admitting: Vascular Surgery

## 2023-09-20 ENCOUNTER — Ambulatory Visit (INDEPENDENT_AMBULATORY_CARE_PROVIDER_SITE_OTHER): Payer: Medicare Other | Admitting: Vascular Surgery

## 2023-09-20 VITALS — BP 120/84 | HR 69 | Resp 18 | Ht 68.0 in | Wt 200.4 lb

## 2023-09-20 DIAGNOSIS — I10 Essential (primary) hypertension: Secondary | ICD-10-CM | POA: Diagnosis not present

## 2023-09-20 DIAGNOSIS — I70213 Atherosclerosis of native arteries of extremities with intermittent claudication, bilateral legs: Secondary | ICD-10-CM

## 2023-09-20 DIAGNOSIS — I7143 Infrarenal abdominal aortic aneurysm, without rupture: Secondary | ICD-10-CM

## 2023-09-20 DIAGNOSIS — I6523 Occlusion and stenosis of bilateral carotid arteries: Secondary | ICD-10-CM

## 2023-09-20 DIAGNOSIS — I2510 Atherosclerotic heart disease of native coronary artery without angina pectoris: Secondary | ICD-10-CM | POA: Diagnosis not present

## 2023-09-25 ENCOUNTER — Telehealth (INDEPENDENT_AMBULATORY_CARE_PROVIDER_SITE_OTHER): Payer: Self-pay

## 2023-09-25 NOTE — Telephone Encounter (Signed)
I attempted to contact the patient to let her know about her right carotid endarterectomy with Dr. Gilda Crease on 11/09/23 at the [facility]. Pre-op is on 10/29/23 at 9:00 am at the [facility]. Pre-surgical instructions will be sent to Mychart and mailed. A message was left for a return call. Patient called back and pre-surgical instructions were discussed.

## 2023-09-27 DIAGNOSIS — R978 Other abnormal tumor markers: Secondary | ICD-10-CM | POA: Diagnosis not present

## 2023-09-27 DIAGNOSIS — J439 Emphysema, unspecified: Secondary | ICD-10-CM | POA: Diagnosis not present

## 2023-09-27 DIAGNOSIS — I714 Abdominal aortic aneurysm, without rupture, unspecified: Secondary | ICD-10-CM | POA: Diagnosis not present

## 2023-09-27 DIAGNOSIS — Z79899 Other long term (current) drug therapy: Secondary | ICD-10-CM | POA: Diagnosis not present

## 2023-09-27 DIAGNOSIS — N736 Female pelvic peritoneal adhesions (postinfective): Secondary | ICD-10-CM | POA: Diagnosis not present

## 2023-09-27 DIAGNOSIS — N838 Other noninflammatory disorders of ovary, fallopian tube and broad ligament: Secondary | ICD-10-CM | POA: Diagnosis not present

## 2023-09-27 DIAGNOSIS — F17210 Nicotine dependence, cigarettes, uncomplicated: Secondary | ICD-10-CM | POA: Diagnosis not present

## 2023-09-27 DIAGNOSIS — K660 Peritoneal adhesions (postprocedural) (postinfection): Secondary | ICD-10-CM | POA: Diagnosis not present

## 2023-09-27 DIAGNOSIS — Z7902 Long term (current) use of antithrombotics/antiplatelets: Secondary | ICD-10-CM | POA: Diagnosis not present

## 2023-09-27 DIAGNOSIS — J349 Unspecified disorder of nose and nasal sinuses: Secondary | ICD-10-CM | POA: Diagnosis not present

## 2023-09-27 DIAGNOSIS — D270 Benign neoplasm of right ovary: Secondary | ICD-10-CM | POA: Diagnosis not present

## 2023-09-27 DIAGNOSIS — I10 Essential (primary) hypertension: Secondary | ICD-10-CM | POA: Diagnosis not present

## 2023-09-27 DIAGNOSIS — Z888 Allergy status to other drugs, medicaments and biological substances status: Secondary | ICD-10-CM | POA: Diagnosis not present

## 2023-09-27 DIAGNOSIS — R1900 Intra-abdominal and pelvic swelling, mass and lump, unspecified site: Secondary | ICD-10-CM | POA: Diagnosis not present

## 2023-09-27 DIAGNOSIS — I2510 Atherosclerotic heart disease of native coronary artery without angina pectoris: Secondary | ICD-10-CM | POA: Diagnosis not present

## 2023-09-27 DIAGNOSIS — Z90710 Acquired absence of both cervix and uterus: Secondary | ICD-10-CM | POA: Diagnosis not present

## 2023-09-27 DIAGNOSIS — Z955 Presence of coronary angioplasty implant and graft: Secondary | ICD-10-CM | POA: Diagnosis not present

## 2023-09-27 DIAGNOSIS — Z720 Tobacco use: Secondary | ICD-10-CM | POA: Diagnosis not present

## 2023-09-27 HISTORY — PX: BILATERAL SALPINGOOPHORECTOMY: SHX1223

## 2023-10-09 ENCOUNTER — Other Ambulatory Visit: Payer: Self-pay | Admitting: Family Medicine

## 2023-10-09 DIAGNOSIS — E119 Type 2 diabetes mellitus without complications: Secondary | ICD-10-CM

## 2023-10-12 DIAGNOSIS — I10 Essential (primary) hypertension: Secondary | ICD-10-CM | POA: Diagnosis not present

## 2023-10-12 DIAGNOSIS — Z720 Tobacco use: Secondary | ICD-10-CM | POA: Diagnosis not present

## 2023-10-12 DIAGNOSIS — I2510 Atherosclerotic heart disease of native coronary artery without angina pectoris: Secondary | ICD-10-CM | POA: Diagnosis not present

## 2023-10-12 DIAGNOSIS — I714 Abdominal aortic aneurysm, without rupture, unspecified: Secondary | ICD-10-CM | POA: Diagnosis not present

## 2023-10-12 DIAGNOSIS — E782 Mixed hyperlipidemia: Secondary | ICD-10-CM | POA: Diagnosis not present

## 2023-10-12 DIAGNOSIS — J439 Emphysema, unspecified: Secondary | ICD-10-CM | POA: Diagnosis not present

## 2023-10-12 DIAGNOSIS — Z23 Encounter for immunization: Secondary | ICD-10-CM | POA: Diagnosis not present

## 2023-10-22 ENCOUNTER — Inpatient Hospital Stay (HOSPITAL_BASED_OUTPATIENT_CLINIC_OR_DEPARTMENT_OTHER): Payer: Medicare Other | Admitting: Nurse Practitioner

## 2023-10-22 ENCOUNTER — Inpatient Hospital Stay: Payer: Medicare Other

## 2023-10-22 ENCOUNTER — Encounter: Payer: Self-pay | Admitting: Nurse Practitioner

## 2023-10-22 ENCOUNTER — Inpatient Hospital Stay: Payer: Medicare Other | Attending: Internal Medicine

## 2023-10-22 VITALS — Resp 18

## 2023-10-22 VITALS — BP 136/92 | HR 74 | Temp 96.9°F | Wt 196.0 lb

## 2023-10-22 DIAGNOSIS — Z79899 Other long term (current) drug therapy: Secondary | ICD-10-CM | POA: Diagnosis not present

## 2023-10-22 DIAGNOSIS — Z79620 Long term (current) use of immunosuppressive biologic: Secondary | ICD-10-CM | POA: Diagnosis not present

## 2023-10-22 DIAGNOSIS — Z5112 Encounter for antineoplastic immunotherapy: Secondary | ICD-10-CM | POA: Diagnosis not present

## 2023-10-22 DIAGNOSIS — F17210 Nicotine dependence, cigarettes, uncomplicated: Secondary | ICD-10-CM | POA: Diagnosis not present

## 2023-10-22 DIAGNOSIS — C3490 Malignant neoplasm of unspecified part of unspecified bronchus or lung: Secondary | ICD-10-CM | POA: Diagnosis not present

## 2023-10-22 DIAGNOSIS — E039 Hypothyroidism, unspecified: Secondary | ICD-10-CM

## 2023-10-22 DIAGNOSIS — C7972 Secondary malignant neoplasm of left adrenal gland: Secondary | ICD-10-CM | POA: Diagnosis not present

## 2023-10-22 DIAGNOSIS — C3432 Malignant neoplasm of lower lobe, left bronchus or lung: Secondary | ICD-10-CM

## 2023-10-22 LAB — CBC WITH DIFFERENTIAL/PLATELET
Abs Immature Granulocytes: 0.04 10*3/uL (ref 0.00–0.07)
Basophils Absolute: 0.1 10*3/uL (ref 0.0–0.1)
Basophils Relative: 1 %
Eosinophils Absolute: 0.4 10*3/uL (ref 0.0–0.5)
Eosinophils Relative: 4 %
HCT: 47.2 % — ABNORMAL HIGH (ref 36.0–46.0)
Hemoglobin: 15.4 g/dL — ABNORMAL HIGH (ref 12.0–15.0)
Immature Granulocytes: 0 %
Lymphocytes Relative: 36 %
Lymphs Abs: 3.6 10*3/uL (ref 0.7–4.0)
MCH: 28.4 pg (ref 26.0–34.0)
MCHC: 32.6 g/dL (ref 30.0–36.0)
MCV: 87.1 fL (ref 80.0–100.0)
Monocytes Absolute: 0.7 10*3/uL (ref 0.1–1.0)
Monocytes Relative: 7 %
Neutro Abs: 5.3 10*3/uL (ref 1.7–7.7)
Neutrophils Relative %: 52 %
Platelets: 259 10*3/uL (ref 150–400)
RBC: 5.42 MIL/uL — ABNORMAL HIGH (ref 3.87–5.11)
RDW: 14.1 % (ref 11.5–15.5)
WBC: 10 10*3/uL (ref 4.0–10.5)
nRBC: 0 % (ref 0.0–0.2)

## 2023-10-22 LAB — COMPREHENSIVE METABOLIC PANEL
ALT: 16 U/L (ref 0–44)
AST: 20 U/L (ref 15–41)
Albumin: 4.2 g/dL (ref 3.5–5.0)
Alkaline Phosphatase: 112 U/L (ref 38–126)
Anion gap: 11 (ref 5–15)
BUN: 17 mg/dL (ref 8–23)
CO2: 24 mmol/L (ref 22–32)
Calcium: 9.6 mg/dL (ref 8.9–10.3)
Chloride: 105 mmol/L (ref 98–111)
Creatinine, Ser: 0.68 mg/dL (ref 0.44–1.00)
GFR, Estimated: >60 mL/min (ref >60)
Glucose, Bld: 97 mg/dL (ref 70–99)
Potassium: 4.1 mmol/L (ref 3.5–5.1)
Sodium: 140 mmol/L (ref 135–145)
Total Bilirubin: 0.5 mg/dL (ref <1.2)
Total Protein: 7.4 g/dL (ref 6.5–8.1)

## 2023-10-22 LAB — TSH: TSH: 2.142 u[IU]/mL (ref 0.350–4.500)

## 2023-10-22 MED ORDER — SODIUM CHLORIDE 0.9% FLUSH
10.0000 mL | INTRAVENOUS | Status: DC | PRN
Start: 2023-10-22 — End: 2023-10-22
  Administered 2023-10-22: 10 mL
  Filled 2023-10-22: qty 10

## 2023-10-22 MED ORDER — SODIUM CHLORIDE 0.9 % IV SOLN
200.0000 mg | Freq: Once | INTRAVENOUS | Status: AC
  Administered 2023-10-22: 200 mg via INTRAVENOUS
  Filled 2023-10-22: qty 200

## 2023-10-22 MED ORDER — HEPARIN SOD (PORK) LOCK FLUSH 100 UNIT/ML IV SOLN
500.0000 [IU] | Freq: Once | INTRAVENOUS | Status: AC | PRN
  Administered 2023-10-22: 500 [IU]
  Filled 2023-10-22: qty 5

## 2023-10-22 MED ORDER — SODIUM CHLORIDE 0.9 % IV SOLN
Freq: Once | INTRAVENOUS | Status: AC
  Filled 2023-10-22: qty 250

## 2023-10-22 NOTE — Progress Notes (Signed)
[facility] CONSULT NOTE  Patient Care Team: Malva Limes, MD as PCP - General (Family Medicine) Lady Gary Darlin Priestly, MD as Consulting Physician (Cardiology) Schnier, Latina Craver, MD (Vascular Surgery) Lonell Face, MD as Consulting Physician (Neurology) Delano Metz, MD as Referring Physician (Pain Medicine) Pa, [facility] (Optometry) Lady Gary, Darlin Priestly, MD as Consulting Physician (Cardiology) Pasty Spillers, MD (Inactive) as Consulting Physician (Gastroenterology) Nadara Mustard, MD as Referring Physician (Obstetrics and Gynecology) Glory Buff, RN as Oncology Nurse Navigator Earna Coder, MD as Consulting Physician (Oncology)   CHIEF COMPLAINTS/PURPOSE OF CONSULTATION: lung cancer  #  Oncology History Overview Note  #MAY 2022- LLL nodule 2.3 cm Adenocarcinoma Stage IA; s/p lobectomy.  No adjuvant therapy  # CT scan 4th June 2023-highly concerning for recurrent/metastatic disease with-. New left adrenal metastasis;  Ground-glass and part solid nodules in the peripheral right lower lobe, unchanged from 11/07/2021 but slightly enlarged from 01/01/2018. Findings are suspicious for indolent adenocarcinoma.  F One- PFL1 =80%; KRAS G12C PET scan JUNE 20th- 2023-  Enlarged 4 cm hypermetabolic LEFT adrenal metastasis;  Suspicion of metastatic adenopathy to the LEFT hilum.  Part solid nodule in the RIGHT lower lobe without metabolic activity.   3 ADRENAL BIOPSY- CK7 POSITIVE ADENOCARCINOMA- There is limited tissue remaining for ancillary testing, not likely  sufficient for NGS testing.   # AUG 7th, 2023-single agent Keytruda.    Primary cancer of left lower lobe of lung (HCC)  05/09/2021 Initial Diagnosis   Primary cancer of left lower lobe of lung (HCC)   07/04/2022 -  Chemotherapy   Patient is on Treatment Plan : LUNG NSCLC Pembrolizumab (200) q21d     07/10/2022 - 07/10/2022 Chemotherapy   Patient is on Treatment Plan : LUNG NSCLC Pembrolizumab  (200) q21d      HISTORY OF PRESENTING ILLNESS: Patient  ambulating.  Alone.   Laura Nielsen 67 y.o.  female history of smoking; prior history of lung cancer-with likely recurrence to left adrenal gland [s/p Bx CK-7 positive TTF-1 negative]-clinically suggestive of recurrent lung cancer on single agent Rande Lawman is here for follow-up. In interim, she had gyn surgery with Dr Sonia Side at [facility]. Healing well. Has had ct neck and is going to be having endarterectomy in a few weeks. Her fatigue and shortness of breath is unchanged. Denies cough or rashes. No diarrhea.   Review of Systems  Constitutional:  Positive for malaise/fatigue. Negative for chills, diaphoresis, fever and weight loss.  HENT:  Negative for nosebleeds and sore throat.   Eyes:  Negative for double vision.  Respiratory:  Negative for cough, hemoptysis, sputum production, shortness of breath and wheezing.   Cardiovascular:  Negative for palpitations, orthopnea and leg swelling.  Gastrointestinal:  Negative for abdominal pain, blood in stool, constipation, diarrhea, heartburn, melena, nausea and vomiting.  Genitourinary:  Negative for dysuria, frequency and urgency.  Musculoskeletal:  Positive for back pain and joint pain.  Skin: Negative.  Negative for itching and rash.  Neurological:  Positive for tingling. Negative for dizziness, focal weakness, weakness and headaches.  Endo/Heme/Allergies:  Does not bruise/bleed easily.  Psychiatric/Behavioral:  Negative for depression. The patient is not nervous/anxious and does not have insomnia.      MEDICAL HISTORY:  Past Medical History:  Diagnosis Date   AAA (abdominal aortic aneurysm) (HCC)    s/p EVAR AAA 03/27/13   Arthritis    Asthma    Cancer (HCC)    Centrilobular emphysema (HCC)    Complication  of anesthesia    BP drops after   Coronary artery disease    History of kidney stones    Osteoporosis    Primary cancer of left lower lobe of lung (HCC)    Sleep apnea     SURGICAL  HISTORY: Past Surgical History:  Procedure Laterality Date   ABDOMINAL AORTIC ENDOVASCULAR STENT GRAFT  03/27/2013   Dr. Levora Dredge   Cardiac catheterization  02/2009   70-80% stenosis RCA stent placed. started on Plavix   CARDIAC CATHETERIZATION     CATARACT EXTRACTION W/PHACO Left 05/14/2023   Procedure: CATARACT EXTRACTION PHACO AND INTRAOCULAR LENS PLACEMENT (IOC) LEFT DIABETIC  OMIDRIA  19.40  01:34.8;  Surgeon: Estanislado Pandy, MD;  Location: [facility];  Service: Ophthalmology;  Laterality: Left;   CATARACT EXTRACTION W/PHACO Right 06/27/2023   Procedure: CATARACT EXTRACTION PHACO AND INTRAOCULAR LENS PLACEMENT (IOC) RIGHT DIABETIC  6.47  00:42.6;  Surgeon: Estanislado Pandy, MD;  Location: [facility];  Service: Ophthalmology;  Laterality: Right;   COLONOSCOPY WITH PROPOFOL N/A 01/05/2021   Procedure: COLONOSCOPY WITH PROPOFOL;  Surgeon: Pasty Spillers, MD;  Location: [facility] END[facility]COPY;  Service: Endoscopy;  Laterality: N/A;   CORONARY ANGIOPLASTY     stent placed   ESOPHAGOGASTRODUODENOSCOPY (EGD) WITH PROPOFOL N/A 01/05/2021   Procedure: ESOPHAGOGASTRODUODENOSCOPY (EGD) WITH PROPOFOL;  Surgeon: Pasty Spillers, MD;  Location: [facility] END[facility]COPY;  Service: Endoscopy;  Laterality: N/A;   INTERCOSTAL NERVE BLOCK Left 04/06/2021   Procedure: INTERCOSTAL NERVE BLOCK;  Surgeon: Loreli Slot, MD;  Location: [facility] OR;  Service: Thoracic;  Laterality: Left;   IR IMAGING GUIDED PORT INSERTION  06/20/2022   KIDNEY STONE SURGERY  1999   LUNG LOBECTOMY Left 04/06/2021   robotic left lower lobectomy 04/06/2021 Dr. Dorris Fetch for Stage 1A adenocarcinoma   NODE DISSECTION Left 04/06/2021   Procedure: NODE DISSECTION;  Surgeon: Loreli Slot, MD;  Location: [facility] OR;  Service: Thoracic;  Laterality: Left;   TUBAL LIGATION     VAGINAL HYSTERECTOMY     Menometrorrhagia. Excessive bleeding. Unknown if cervix removed.     SOCIAL HISTORY: Social  History   Socioeconomic History   Marital status: Divorced    Spouse name: Not on file   Number of children: 2   Years of education: H/S   Highest education level: High school graduate  Occupational History   Occupation: Disabled   Occupation: retired  Tobacco Use   Smoking status: Every Day    Current packs/day: 1.00    Average packs/day: 0.7 packs/day for 96.4 years (63.0 ttl pk-yrs)    Types: Cigarettes    Start date: 1973   Smokeless tobacco: Never   Tobacco comments:    12/23/19 states she quit for 9 months and then started back.   Vaping Use   Vaping status: Never Used  Substance and Sexual Activity   Alcohol use: Yes    Comment: rarely - once a year   Drug use: No   Sexual activity: Not on file  Other Topics Concern   Not on file  Social History Narrative   Hx of smoking; quit prior to lung surgery then started back. Lives in [place] alone. Used to work in [employer], [employer]- Facilities manager. On disability sec to spinal pain.    Social Determinants of Health   Financial Resource Strain: Medium Risk (02/16/2023)   Overall Financial Resource Strain (CARDIA)    Difficulty of Paying Living Expenses: Somewhat hard  Food Insecurity: Patient Declined (02/16/2023)  Hunger Vital Sign    Worried About Running Out of Food in the Last Year: Patient declined    Ran Out of Food in the Last Year: Patient declined  Transportation Needs: No Transportation Needs (02/16/2023)   PRAPARE - Administrator, Civil Service (Medical): No    Lack of Transportation (Non-Medical): No  Physical Activity: Unknown (02/16/2023)   Exercise Vital Sign    Days of Exercise per Week: 0 days    Minutes of Exercise per Session: Patient declined  Stress: Stress Concern Present (02/16/2023)   [employer] of Occupational Health - Occupational Stress Questionnaire    Feeling of Stress : Rather much  Social Connections: Unknown (02/16/2023)   Social Connection and Isolation Panel [NHANES]     Frequency of Communication with Friends and Family: More than three times a week    Frequency of Social Gatherings with Friends and Family: Once a week    Attends Religious Services: Not on Insurance claims handler of Clubs or Organizations: No    Attends Banker Meetings: Never    Marital Status: Patient declined  Intimate Partner Violence: Not At Risk (02/20/2023)   Humiliation, Afraid, Rape, and Kick questionnaire    Fear of Current or Ex-Partner: No    Emotionally Abused: No    Physically Abused: No    Sexually Abused: No    FAMILY HISTORY: Family History  Problem Relation Age of Onset   Hypertension Mother    Coronary artery disease Mother    Heart attack Mother        acute   Cancer Mother    Alcohol abuse Father    Depression Father    Hypertension Father    Heart attack Father 34       acute   Alcohol abuse Sister    Hyperlipidemia Sister    Hypertension Sister    Cancer Sister 66   Heart attack Sister        x's 2   Coronary artery disease Sister 37       x's 2   Breast cancer Sister 53    ALLERGIES:  is allergic to atorvastatin, omeprazole, and bupropion.  MEDICATIONS:  Current Outpatient Medications  Medication Sig Dispense Refill   allopurinol (ZYLOPRIM) 100 MG tablet Take 1 tablet (100 mg total) by mouth daily. 90 tablet 0   aspirin 81 MG tablet Take 81 mg by mouth daily.      bisoprolol (ZEBETA) 10 MG tablet Take 1 tablet (10 mg total) by mouth daily. 90 tablet 1   Budeson-Glycopyrrol-Formoterol (BREZTRI AEROSPHERE) 160-9-4.8 MCG/ACT AERO Inhale 2 puffs into the lungs in the morning and at bedtime. 1284 g 11   Cholecalciferol 25 MCG (1000 UT) capsule Take 1,000 Units by mouth daily.     clopidogrel (PLAVIX) 75 MG tablet Take 1 tablet (75 mg total) by mouth daily. 90 tablet 4   diazepam (VALIUM) 5 MG tablet Take 1 tablet (5 mg total) by mouth every 12 (twelve) hours as needed. 30 tablet 0   fluticasone (FLONASE) 50 MCG/ACT nasal spray Place into  both nostrils daily.     HYDROcodone-acetaminophen (NORCO/VICODIN) 5-325 MG tablet Take 1-2 tablets by mouth every 6 (six) hours as needed for moderate pain. 20 tablet 0   ibuprofen (ADVIL) 200 MG tablet Take 400 mg by mouth 2 (two) times daily as needed (headaches).     lidocaine-prilocaine (EMLA) cream Apply on the port. 30 -45 min  prior to port  access. 30 g 3   lisinopril-hydrochlorothiazide (ZESTORETIC) 20-12.5 MG tablet Take 1 tablet by mouth daily. 90 tablet 0   magnesium gluconate (MAGONATE) 500 MG tablet Take 500 mg by mouth daily. At bedtime     pantoprazole (PROTONIX) 20 MG tablet Take 1 tablet (20 mg total) by mouth daily. 90 tablet 0   Polyethyl Glyc-Propyl Glyc PF (SYSTANE PRESERVATIVE FREE) 0.4-0.3 % SOLN Apply to eye.     PROAIR HFA 108 (90 Base) MCG/ACT inhaler Inhale 2 puffs into the lungs every 6 (six) hours as needed for wheezing or shortness of breath. 8.5 g 4   rosuvastatin (CRESTOR) 20 MG tablet Take 1 tablet (20 mg total) by mouth daily. 90 tablet 0   Semaglutide, 1 MG/DOSE, (OZEMPIC, 1 MG/DOSE,) 4 MG/3ML SOPN Inject 1 mg into the skin once a week. 9 mL 1   No current facility-administered medications for this visit.   Facility-Administered Medications Ordered in Other Visits  Medication Dose Route Frequency Provider Last Rate Last Admin   heparin lock flush 100 UNIT/ML injection            PHYSICAL EXAMINATION: ECOG PERFORMANCE STATUS: 1 - Symptomatic but completely ambulatory Vitals:   10/22/23 0933  BP: (!) 136/92  Pulse: 74  Temp: (!) 96.9 F (36.1 C)  SpO2: 100%   Filed Weights   10/22/23 0933  Weight: 196 lb (88.9 kg)   Physical Exam Vitals reviewed.  Constitutional:      Appearance: She is not ill-appearing.  HENT:     Head: Normocephalic and atraumatic.     Mouth/Throat:     Pharynx: No oropharyngeal exudate.  Eyes:     Extraocular Movements: Extraocular movements intact.     Pupils: Pupils are equal, round, and reactive to light.   Cardiovascular:     Rate and Rhythm: Normal rate and regular rhythm.     Heart sounds: No murmur heard. Pulmonary:     Effort: No respiratory distress.     Breath sounds: No wheezing.     Comments: Decreased breath sounds bilaterally.  No wheeze or crackles Abdominal:     General: There is no distension.     Palpations: Abdomen is soft. There is no mass.     Tenderness: There is no abdominal tenderness. There is no guarding or rebound.     Comments: Lap sites healing well.   Musculoskeletal:        General: No deformity.  Skin:    General: Skin is warm.     Coloration: Skin is not pale.     Findings: No rash.  Neurological:     Mental Status: She is alert and oriented to person, place, and time.  Psychiatric:        Mood and Affect: Mood and affect normal.        Behavior: Behavior normal.    LABORATORY DATA:  I have reviewed the data as listed Lab Results  Component Value Date   WBC 10.0 10/22/2023   HGB 15.4 (H) 10/22/2023   HCT 47.2 (H) 10/22/2023   MCV 87.1 10/22/2023   PLT 259 10/22/2023   Recent Labs    08/13/23 0818 09/03/23 0824 10/22/23 0923  NA 137 139 140  K 3.7 3.7 4.1  CL 106 107 105  CO2 24 25 24   GLUCOSE 99 109* 97  BUN 20 17 17   CREATININE 0.66 0.65 0.68  CALCIUM 9.3 9.5 9.6  GFRNONAA >60 >60 >60  PROT 7.0 7.2 7.4  ALBUMIN 4.0  4.0 4.2  AST 21 18 20   ALT 17 15 16   ALKPHOS 86 81 112  BILITOT 0.7 0.9 0.5   Lab Results  Component Value Date   TSH 2.524 09/03/2023     RADIOGRAPHIC STUDIES: I have personally reviewed the radiological images as listed and agreed with the findings in the report. No results found.   ASSESSMENT & PLAN:   Primary cancer of left lower lobe of lung (HCC) #JUNE 2023-STAGE IV--recurrent/metastatic disease with New left adrenal metastasis;  PET scan JUNE 20th- 2023-  Enlarged 4 cm hypermetabolic LEFT adrenal metastasis;  Suspicion of metastatic adenopathy to the LEFT hilum.  Part solid nodule in the RIGHT lower  lobe without metabolic activity. 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 Currently on single agent Keytruda [PD-L1 greater than 80% ].     # CT scan- June 13th, 2024- Status post left lower lobectomy. No evidence of recurrent or metastatic disease in the chest; Unchanged small ground-glass nodules in the right lower lobe, including a 1.0 x 0.8 cm nodule in the dependent right lower lobe- stable.    # She had a break from maintenance treatment due to planned gyn-onc surgery (see below). TSH September 2024 - WNL. Tolerating keytruda well overall- mild chills which are unchanged. Labs today reviewed and acceptable for treatment. Proceed with [place] today. Plan for CT c/a/p prior to next cycle.    # Right mildly complex cystic adnexal mass noted-June 13th, 2024- Significant interval enlargement of a multiseptated cystic lesion of the right ovary, now measuring 11.7 x 8.2 cm, previously 8.1 x 5.8 cm. 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  S/p  cardiac /pulmonary- PFTs/ stress test. Underwent lap oophorectomy and salpingectomy with Dr. Sonia Side at [facility] on 09/27/23. 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 Right tube, left ovary and tube, omentum were negative for malignancy. Negative washings. She has post op with Dr. Sonia Side scheduled for 11/07/23.    # Mild Hypokalemia: discussed dietary supplement. Stable.    # Chronic pain: headaches/cervical pain/back pain [Dr.Naveira; pain doctor]  On NSAIDs [ monitor for now. OCT 2023- MRI brain- NEGATIVE for any metastatic disease. FEB 2024- MRI lumbar spine- degenerative disease- on ibuprofen prn- BID. Exacerbated by surgical positioning but now at baseline. Monitor.    # Left rib pain- Post throacotomy pain/tingling and numbness:  Continue gabapentin -300 mg TID; and then at extra at night prn. stable   # CAD/ PVD-  cramping- s/p evaluation [Dr.Schneir]; ? Carotid occlusion- SEP 13th CT scan neck- Dense calcified plaque of right carotid bifurcation and ICA bulb. > 75% stenosis. Left- ~30 % stenosis. Moderate stenosis of left subclavian. Plans for endarterectomy on 12/6 with Dr. Gilda Crease. Reviewed with Dr. Donneta Romberg who feels ok to proceed with [facility] as planned.    # COPD- s/p PFTs in anticipation of gyn surgery. She is followed by Dr. Aundria Rud. Encouraged smoking cessation and reviewed how smoking decreases healing post operatively and increases risks of cardiovascular disease.    # IV Access: s/p  port placement stable   *AM appts- Monday-    Oct 24th-sx  12/6- endarterectomy  # DISPOSITION:  # keytruda today; # 2 weeks- CT C/A/P # 3 weeks- port/lab, Dr Donneta Romberg, +/- Rande Lawman- la  No problem-specific Assessment & Plan notes found for this encounter.    Alinda Dooms, NP 10/22/2023

## 2023-10-25 ENCOUNTER — Encounter: Payer: Self-pay | Admitting: Internal Medicine

## 2023-10-29 ENCOUNTER — Other Ambulatory Visit (INDEPENDENT_AMBULATORY_CARE_PROVIDER_SITE_OTHER): Payer: Self-pay | Admitting: Nurse Practitioner

## 2023-10-29 ENCOUNTER — Encounter
Admission: RE | Admit: 2023-10-29 | Discharge: 2023-10-29 | Disposition: A | Discharge status: Home / Self Care | Payer: Medicare Other | Source: Ambulatory Visit | Attending: Vascular Surgery | Admitting: Vascular Surgery

## 2023-10-29 ENCOUNTER — Other Ambulatory Visit: Payer: Self-pay

## 2023-10-29 VITALS — BP 123/86 | HR 71 | Temp 97.4°F | Resp 17 | Ht 68.0 in | Wt 194.0 lb

## 2023-10-29 DIAGNOSIS — Z01818 Encounter for other preprocedural examination: Secondary | ICD-10-CM | POA: Insufficient documentation

## 2023-10-29 DIAGNOSIS — I6523 Occlusion and stenosis of bilateral carotid arteries: Secondary | ICD-10-CM

## 2023-10-29 DIAGNOSIS — Z01812 Encounter for preprocedural laboratory examination: Secondary | ICD-10-CM

## 2023-10-29 HISTORY — DX: Atherosclerosis of native arteries of extremities with intermittent claudication, bilateral legs: I70.213

## 2023-10-29 HISTORY — DX: Fatty (change of) liver, not elsewhere classified: K76.0

## 2023-10-29 HISTORY — DX: Gastro-esophageal reflux disease without esophagitis: K21.9

## 2023-10-29 HISTORY — DX: Other specified conditions associated with female genital organs and menstrual cycle: N94.89

## 2023-10-29 HISTORY — DX: Unspecified osteoarthritis, unspecified site: M19.90

## 2023-10-29 HISTORY — DX: Vitamin D deficiency, unspecified: E55.9

## 2023-10-29 HISTORY — DX: Occlusion and stenosis of bilateral carotid arteries: I65.23

## 2023-10-29 HISTORY — DX: Other spondylosis with radiculopathy, cervical region: M47.22

## 2023-10-29 HISTORY — DX: Polyp of colon: K63.5

## 2023-10-29 HISTORY — DX: Other ovarian cyst, unspecified side: N83.299

## 2023-10-29 HISTORY — DX: Other chronic pain: G89.29

## 2023-10-29 HISTORY — DX: Atherosclerosis of aorta: I70.0

## 2023-10-29 HISTORY — DX: Spondylosis without myelopathy or radiculopathy, lumbar region: M47.816

## 2023-10-29 HISTORY — DX: Type 2 diabetes mellitus without complications: E11.9

## 2023-10-29 HISTORY — DX: Deficiency of other specified B group vitamins: E53.8

## 2023-10-29 HISTORY — DX: Gout, unspecified: M10.9

## 2023-10-29 HISTORY — DX: Malignant neoplasm of unspecified part of left adrenal gland: C74.92

## 2023-10-29 HISTORY — DX: Essential (primary) hypertension: I10

## 2023-10-29 HISTORY — DX: Pure hypercholesterolemia, unspecified: E78.00

## 2023-10-29 LAB — TYPE AND SCREEN
ABO/RH(D): A POS
Antibody Screen: NEGATIVE

## 2023-10-29 LAB — SURGICAL PCR SCREEN
MRSA, PCR: NEGATIVE
Staphylococcus aureus: NEGATIVE

## 2023-10-29 NOTE — Patient Instructions (Addendum)
Your procedure is scheduled on: Friday, December 6 Report to the Registration Desk on the 1st floor of the [facility] To find out your arrival time, please call [phone] between 1PM - 3PM on: Thursday, December 5 If your arrival time is 6:00 am, do not arrive before that time as the [facility] entrance doors do not open until 6:00 am.  REMEMBER: Instructions that are not followed completely may result in serious medical risk, up to and including death; or upon the discretion of your surgeon and anesthesiologist your surgery may need to be rescheduled.  Do not eat or drink after midnight the night before surgery.  No gum chewing or hard candies.  One week prior to surgery: starting November 29 Stop Anti-inflammatories (NSAIDS) such as Advil, Aleve, Ibuprofen, Motrin, Naproxen, Naprosyn and Aspirin based products such as Excedrin, Goody's Powder, BC Powder. Stop ANY OVER THE COUNTER supplements until after surgery.  You may however, continue to take Tylenol if needed for pain up until the day of surgery.  Continue taking the aspirin 81 mg.  Plavix (clopidogrel) - hold for 5 days before surgery. Last day to take Plavix is Saturday, November 30. Resume AFTER surgery per surgeon instruction.  Ozempic (Semaglutide) - hold for 7 days before surgery. Do not take Ozempic on Saturday/Sunday, November 30/31. Resume AFTER surgery on your regular weekly day.  Continue taking all of your other prescription medications up until the day of surgery.  ON THE DAY OF SURGERY ONLY TAKE THESE MEDICATIONS WITH SIPS OF WATER:  Bisoprolol Breztri inhaler Pantoprazole (Protonix) Rosuvastatin (Crestor)  Use inhalers on the day of surgery and bring your albuterol to the hospital.  No Alcohol for 24 hours before or after surgery.  No Smoking including e-cigarettes for 24 hours before surgery.  No chewable tobacco products for at least 6 hours before surgery.  No nicotine patches on the day of  surgery.  Do not use any "recreational" drugs for at least a week (preferably 2 weeks) before your surgery.  Please be advised that the combination of cocaine and anesthesia may have negative outcomes, up to and including death. If you test positive for cocaine, your surgery will be cancelled.  On the morning of surgery brush your teeth with toothpaste and water, you may rinse your mouth with mouthwash if you wish. Do not swallow any toothpaste or mouthwash.  Use CHG Soap as directed on instruction sheet.  Do not wear jewelry, make-up, hairpins, clips or nail polish.  For welded (permanent) jewelry: bracelets, anklets, waist bands, etc.  Please have this removed prior to surgery.  If it is not removed, there is a chance that hospital personnel will need to cut it off on the day of surgery.  Do not wear lotions, powders, or perfumes.   Do not shave body hair from the neck down 48 hours before surgery.  Contact lenses, hearing aids and dentures may not be worn into surgery.  Do not bring valuables to the hospital. [facility] is not responsible for any missing/lost belongings or valuables.   Notify your doctor if there is any change in your medical condition (cold, fever, infection).  Wear comfortable clothing (specific to your surgery type) to the hospital.  After surgery, you can help prevent lung complications by doing breathing exercises.  Take deep breaths and cough every 1-2 hours. Your doctor may order a device called an Incentive Spirometer to help you take deep breaths.  If you are being admitted to the hospital  overnight, leave your suitcase in the car. After surgery it may be brought to your room.  In case of increased patient census, it may be necessary for you, the patient, to continue your postoperative care in the Same Day Surgery department.  If you are being discharged the day of surgery, you will not be allowed to drive home. You will need a responsible individual  to drive you home and stay with you for 24 hours after surgery.   If you are taking public transportation, you will need to have a responsible individual with you.  Please call the Pre-admissions Testing Dept. at [phone] if you have any questions about these instructions.  Surgery Visitation Policy:  Patients having surgery or a procedure may have two visitors.  Children under the age of 8 must have an adult with them who is not the patient.  Inpatient Visitation:    Visiting hours are 7 a.m. to 8 p.m. Up to four visitors are allowed at one time in a patient room. The visitors may rotate out with other people during the day.  One visitor age 38 or older may stay with the patient overnight and must be in the room by 8 p.m.     Preparing for Surgery with CHLORHEXIDINE GLUCONATE (CHG) Soap  Chlorhexidine Gluconate (CHG) Soap  o An antiseptic cleaner that kills germs and bonds with the skin to continue killing germs even after washing  o Used for showering the night before surgery and morning of surgery  Before surgery, you can play an important role by reducing the number of germs on your skin.  CHG (Chlorhexidine gluconate) soap is an antiseptic cleanser which kills germs and bonds with the skin to continue killing germs even after washing.  Please do not use if you have an allergy to CHG or antibacterial soaps. If your skin becomes reddened/irritated stop using the CHG.  1. Shower the NIGHT BEFORE SURGERY and the MORNING OF SURGERY with CHG soap.  2. If you choose to wash your hair, wash your hair first as usual with your normal shampoo.  3. After shampooing, rinse your hair and body thoroughly to remove the shampoo.  4. Use CHG as you would any other liquid soap. You can apply CHG directly to the skin and wash gently with a scrungie or a clean washcloth.  5. Apply the CHG soap to your body only from the neck down. Do not use on open wounds or open sores. Avoid contact  with your eyes, ears, mouth, and genitals (private parts). Wash face and genitals (private parts) with your normal soap.  6. Wash thoroughly, paying special attention to the area where your surgery will be performed.  7. Thoroughly rinse your body with warm water.  8. Do not shower/wash with your normal soap after using and rinsing off the CHG soap.  9. Pat yourself dry with a clean towel.  10. Wear clean pajamas to bed the night before surgery.  12. Place clean sheets on your bed the night of your first shower and do not sleep with pets.  13. Shower again with the CHG soap on the day of surgery prior to arriving at the hospital.  14. Do not apply any deodorants/lotions/powders.  15. Please wear clean clothes to the hospital.

## 2023-10-29 NOTE — Progress Notes (Signed)
  Perioperative Services Pre-Admission/Anesthesia Testing    Date: 10/29/23  Name: Laura Nielsen MRN:   [id number]  Re: GLP-1 clearance and provider recommendations   Planned Surgical Procedure(s):    Case: [id number] Date/Time: 11/09/23 0715   Procedure: ENDARTERECTOMY CAROTID (Right)   Anesthesia type: General   Pre-op diagnosis: BILATERAL CAROTID ARTERY STENOSIS   Location: [facility] OR ROOM 08 / [facility]   Surgeons: Renford Dills, MD      Clinical Notes:  Patient is scheduled for the above procedure with the indicated provider/surgeon. In review of her medication reconciliation it was noted that patient is on a prescribed GLP-1 medication. Per guidelines issued by the American Society of Anesthesiologists (ASA), it is recommended that these medications be held for 7 days prior to the patient undergoing any type of elective surgical procedure. The patient is taking the following GLP-1 medication:  [x]  SEMAGLUTIDE   []  EXENATIDE  []  LIRAGLUTIDE   []  LIXISENATIDE  []  DULAGLUTIDE     []  TIRZEPATIDE (GLP-1/GIP)  Reached out to prescribing provider Sherrie Mustache, MD) to make them aware of the guidelines from anesthesia. Given that this patient takes the prescribed GLP-1 medication for her  diabetes diagnosis, rather than for weight loss, recommendations from the prescribing provider were solicited. Prescribing provider made aware of the following so that informed decision/POC can be developed for this patient that may be taking medications belonging to these drug classes:  Oral GLP-1 medications will be held 1 day prior to surgery.  Injectable GLP-1 medications will be held 7 days prior to surgery.  Metformin is routinely held 48 hours prior to surgery due to renal concerns, potential need for contrasted imaging perioperatively, and the potential for tissue hypoxia leading to drug induced lactic acidosis.  All SGLT2i medications are held 72 hours prior to surgery as  they can be associated with the increased potential for developing euglycemic diabetic ketoacidosis (EDKA).   Impression and Plan:  Laura Nielsen is on a prescribed GLP-1 medication, which induces the known side effect of decreased gastric emptying. Efforts are bring made to mitigate the risk of perioperative hyperglycemic events, as elevated blood glucose levels have been found to contribute to intra/postoperative complications. Additionally, hyperglycemic extremes can potentially necessitate the postponing of a patient's elective case in order to better optimize perioperative glycemic control, again with the aforementioned guidelines in place. With this in mind, recommendations have been sought from the prescribing provider, who has cleared patient to proceed with holding the prescribed GLP-1 as per the guidelines from the ASA.   Provider recommending: no further recommendations received from the prescribing provider.  Copy of signed clearance and recommendations placed on patient's chart for inclusion in their medical record and for review by the surgical/anesthetic team on the day of her procedure.   Quentin Mulling, MSN, APRN, FNP-C, CEN [facility]  Perioperative Services Nurse Practitioner Phone: [phone] Fax: [phone] 10/29/23 3:08 PM  NOTE: This note has been prepared using Dragon dictation software. Despite my best ability to proofread, there is always the potential that unintentional transcriptional errors may still occur from this process.

## 2023-11-05 ENCOUNTER — Ambulatory Visit
Admission: RE | Admit: 2023-11-05 | Discharge: 2023-11-05 | Disposition: A | Discharge status: Home / Self Care | Payer: Medicare Other | Source: Ambulatory Visit | Attending: Nurse Practitioner | Admitting: Nurse Practitioner

## 2023-11-05 DIAGNOSIS — J432 Centrilobular emphysema: Secondary | ICD-10-CM | POA: Diagnosis not present

## 2023-11-05 DIAGNOSIS — C3490 Malignant neoplasm of unspecified part of unspecified bronchus or lung: Secondary | ICD-10-CM | POA: Diagnosis not present

## 2023-11-05 DIAGNOSIS — Z5112 Encounter for antineoplastic immunotherapy: Secondary | ICD-10-CM | POA: Insufficient documentation

## 2023-11-05 DIAGNOSIS — I700 Atherosclerosis of aorta: Secondary | ICD-10-CM | POA: Diagnosis not present

## 2023-11-05 DIAGNOSIS — N281 Cyst of kidney, acquired: Secondary | ICD-10-CM | POA: Diagnosis not present

## 2023-11-05 MED ORDER — IOHEXOL 300 MG/ML  SOLN
100.0000 mL | Freq: Once | INTRAMUSCULAR | Status: AC | PRN
  Administered 2023-11-05: 100 mL via INTRAVENOUS

## 2023-11-07 ENCOUNTER — Encounter: Payer: Self-pay | Admitting: Vascular Surgery

## 2023-11-07 ENCOUNTER — Inpatient Hospital Stay: Payer: Medicare Other | Attending: Internal Medicine | Admitting: Nurse Practitioner

## 2023-11-07 VITALS — BP 146/67 | HR 45 | Temp 97.6°F | Resp 20 | Wt 196.0 lb

## 2023-11-07 DIAGNOSIS — Z5112 Encounter for antineoplastic immunotherapy: Secondary | ICD-10-CM | POA: Diagnosis not present

## 2023-11-07 DIAGNOSIS — M549 Dorsalgia, unspecified: Secondary | ICD-10-CM | POA: Diagnosis not present

## 2023-11-07 DIAGNOSIS — D270 Benign neoplasm of right ovary: Secondary | ICD-10-CM | POA: Insufficient documentation

## 2023-11-07 DIAGNOSIS — R519 Headache, unspecified: Secondary | ICD-10-CM | POA: Diagnosis not present

## 2023-11-07 DIAGNOSIS — Z90722 Acquired absence of ovaries, bilateral: Secondary | ICD-10-CM | POA: Diagnosis not present

## 2023-11-07 DIAGNOSIS — J4489 Other specified chronic obstructive pulmonary disease: Secondary | ICD-10-CM | POA: Insufficient documentation

## 2023-11-07 DIAGNOSIS — G894 Chronic pain syndrome: Secondary | ICD-10-CM | POA: Diagnosis not present

## 2023-11-07 DIAGNOSIS — D279 Benign neoplasm of unspecified ovary: Secondary | ICD-10-CM | POA: Diagnosis not present

## 2023-11-07 DIAGNOSIS — E876 Hypokalemia: Secondary | ICD-10-CM | POA: Insufficient documentation

## 2023-11-07 DIAGNOSIS — R0781 Pleurodynia: Secondary | ICD-10-CM | POA: Insufficient documentation

## 2023-11-07 DIAGNOSIS — M542 Cervicalgia: Secondary | ICD-10-CM | POA: Insufficient documentation

## 2023-11-07 DIAGNOSIS — Z79899 Other long term (current) drug therapy: Secondary | ICD-10-CM | POA: Insufficient documentation

## 2023-11-07 DIAGNOSIS — C7972 Secondary malignant neoplasm of left adrenal gland: Secondary | ICD-10-CM | POA: Insufficient documentation

## 2023-11-07 DIAGNOSIS — C3432 Malignant neoplasm of lower lobe, left bronchus or lung: Secondary | ICD-10-CM | POA: Insufficient documentation

## 2023-11-07 NOTE — Progress Notes (Signed)
Perioperative / Anesthesia Services  Pre-Admission Testing Clinical Review / Pre-Operative Anesthesia Consult  Date: 11/08/23  Patient Demographics:  Name: Laura Nielsen DOB:   [birth year] MRN:   [id number]  Planned Surgical Procedure(s):    Case: [id number] Date/Time: 11/09/23 0715   Procedure: ENDARTERECTOMY CAROTID (Right)   Anesthesia type: General   Pre-op diagnosis: BILATERAL CAROTID ARTERY STENOSIS   Location: [facility] OR ROOM 08 / [facility]   Surgeons: Renford Dills, MD     NOTE: Available PAT nursing documentation and vital signs have been reviewed. Clinical nursing staff has updated patient's PMH/PSHx, current medication list, and drug allergies/intolerances to ensure comprehensive history available to assist in medical decision making as it pertains to the aforementioned surgical procedure and anticipated anesthetic course. Extensive review of available clinical information personally performed. Laura Nielsen PMH and PSHx updated with any diagnoses/procedures that  may have been inadvertently omitted during her intake with the pre-admission testing department's nursing staff.  Clinical Discussion:  Laura Nielsen is a 67 y.o. female who is submitted for pre-surgical anesthesia review and clearance prior to her undergoing the above procedure. Patient is a Current Smoker (63 pack years). 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  Patient is followed by cardiology Darrold Junker, MD). She was last seen in the cardiology clinic on 10/12/2023; notes reviewed. At the time of her clinic visit, patient doing well overall from  a cardiovascular perspective.  Patient with chronic exertional dyspnea related to her ongoing smoking history, COPD, and recurrent stage IV pulmonary malignancy. Patient denied any chest pain, PND, orthopnea, palpitations, significant peripheral edema, weakness, fatigue, vertiginous symptoms, or presyncope/syncope. Patient with a past medical history significant for cardiovascular diagnoses. Documented physical exam was grossly benign, providing no evidence of acute exacerbation and/or decompensation of the patient's known cardiovascular conditions.  Patient underwent diagnostic LEFT heart catheterization on 03/16/2009 revealing multivessel CAD; 25% mid LCx and 70% mid RCA.  Subsequent PCI was performed placing a 3.0 x 18 mm Xience V DES x 1 to the mid RCA lesion.  Procedure yielded excellent angiographic result and TIMI-3 flow.  Patient with a history of large AAA.  She underwent EVAR stent graft repair on 03/27/2013.  Patient with a known history of BILATERAL carotid artery disease.  Degree of stenosis has been serially monitored via carotid Doppler studies since time of diagnosis.  Most recent carotid Doppler performed on 11/20/2022 revealed a 60 to 79% stenosis of the RICA.  Follow-up CTA imaging of the neck performed on 08/17/2023 revealed a >75% RICA stenosis with a contralateral 30% stenosis of the LICA.  Most recent TTE was performed on 06/29/2023 revealing a normal left ventricular systolic function with an EF of >55%.  No regional wall motions noted. Left ventricular diastolic Doppler parameters consistent with abnormal relaxation (G1DD).  Right ventricular size and function normal.  There was trivial pulmonary, in addition to mild mitral and tricuspid valve regurgitation.  RVSP 24.7 mmHg. All transvalvular gradients were noted to be normal providing no evidence suggestive of valvular stenosis. Aorta normal in size with no evidence of aneurysmal dilatation.  Most recent myocardial perfusion  imaging study was performed on 07/16/2023 revealing a normal left ventricular systolic function with a hyperdynamic LVEF  of 70%.  There were no regional wall motion abnormalities.  SPECT images demonstrated a small reversible perfusion abnormality of mild intensity present in the apical region on stress images.  Findings consistent with mild apical ischemia.  Patient remains on daily DAPT therapy using low-dose ASA and clopidogrel.  Patient reportedly compliant with therapy with no evidence or reports of GI/GU related bleeding.  Blood pressure reasonably controlled at 130/60 mmHg on currently prescribed beta-blocker (bisoprolol), ACEi (lisinopril) and diuretic (hydrochlorothiazide) therapies.  Patient is on rosuvastatin for her HLD diagnosis and ASCVD prevention. Patient is not diabetic. She does have an OSAH diagnosis, however she does not utilize nocturnal PAP therapy. Patient is able to complete all of her  ADL/IADLs without cardiovascular limitation.  Per the DASI, patient is able to achieve at least 4 METS of physical activity without experiencing any significant degree of angina/anginal equivalent symptoms. No changes were made to her medication regimen during her visit with cardiology.  Patient scheduled to follow-up with outpatient cardiology in 4 months or sooner if needed.  Laura Nielsen is scheduled for a RIGHT CAROTID ENDARTERECTOMY  on 11/09/2023 with Dr. Levora Dredge, MD.  Given patient's past medical history significant for cardiovascular diagnoses, presurgical cardiac clearance was sought by the PAT team. Per cardiology, "this patient is optimized for surgery and may proceed with the planned procedural course with a LOW risk of significant perioperative cardiovascular complications".  Again, this patient remains on daily DAPT therapy.  Per instructions from her vascular surgeon, patient will hold her clopidogrel dose for 5 days prior to her procedure with plans to restart as soon as  postoperative bleeding risk felt to be minimized by her primary attending surgeon.  She is aware that her last dose of clopidogrel should be on 11/03/2023.  Patient will continue her daily low-dose ASA throughout her perioperative course.  Patient denies previous perioperative complications with anesthesia in the past. In review of the available records, it is noted that patient underwent a general anesthetic course at [facility] ([facility]) in 09/2023 without documented complications.      11/07/2023    3:21 PM 10/29/2023    9:14 AM 10/22/2023    9:33 AM  Vitals with BMI  Height  5\' 8"    Weight 196 lbs 194 lbs 196 lbs  BMI 29.81 29.5   Systolic 146 123 161  Diastolic 67 86 92  Pulse 45 71 74    Providers/Specialists:   NOTE: Primary physician provider listed below. Patient may have been seen by APP or partner within same practice.   PROVIDER ROLE / SPECIALTY LAST OV  Schnier, Latina Craver, MD Vascular Surgery (Surgeon) 09/20/2023  Malva Limes, MD Primary Care Provider 08/15/2023  Marcina Millard, MD Cardiology 10/12/2023  Louretta Shorten, MD Medical Oncology 10/22/2023  Evelena Asa, MD GYN Oncology 08/08/2023   Allergies:  Atorvastatin, Omeprazole, and Bupropion  Current Home Medications:   No current facility-administered medications for this encounter.    acetaminophen (TYLENOL) 500 MG tablet   allopurinol (ZYLOPRIM) 100 MG tablet   aspirin 81 MG tablet   bisoprolol (ZEBETA) 10 MG tablet   Budeson-Glycopyrrol-Formoterol (BREZTRI AEROSPHERE) 160-9-4.8 MCG/ACT AERO   clopidogrel (PLAVIX) 75 MG tablet   ibuprofen (ADVIL) 200 MG tablet   lidocaine-prilocaine (EMLA) cream   lisinopril-hydrochlorothiazide (ZESTORETIC) 20-12.5 MG tablet   magnesium gluconate (MAGONATE) 500 MG tablet   pantoprazole (PROTONIX) 20 MG tablet   Polyethyl Glyc-Propyl Glyc PF (SYSTANE PRESERVATIVE FREE) 0.4-0.3 % SOLN   PROAIR HFA  108 (90 Base) MCG/ACT inhaler    rosuvastatin (CRESTOR) 20 MG tablet   Semaglutide, 1 MG/DOSE, (OZEMPIC, 1 MG/DOSE,) 4 MG/3ML SOPN    heparin lock flush 100 UNIT/ML injection   History:   Past Medical History:  Diagnosis Date   AAA (abdominal aortic aneurysm) (HCC)    a.) s/p EVAR stent graft repair 03/27/2013   Adnexal mass    Aortic atherosclerosis (HCC)    Arthritis    Asthma    Atherosclerosis of native artery of both lower extremities with intermittent claudication (HCC)    B12 deficiency    Bilateral carotid artery stenosis    a.) doppler 05/06/2020 and 06/30/2021: 1-39% BICA; b.) doppler 11/20/2022: 60-79% RICA; c.) CTA neck 08/17/2023: >75% RICA, 30% LICA   CAD (coronary artery disease) 03/16/2009   a.) LHC/PCI 03/16/2009: 25% mLCx, 70% mRCA (3 x 18 mm Xience V DES); b.) MV 10/22/2017 and 03/16/2021: no ischemia; c.) MV 07/16/2023: mild apical ischemia   Cancer of left adrenal gland (HCC) 06/20/2022   a.) CNB 06/20/2022 -> pathology (+) for CK7 (TTF-1 -) adenocarcinoma   Centrilobular emphysema (HCC)    Cervical spinal stenosis    Cervical spondylosis with radiculopathy    Chronic back pain    Colon polyp    Complex ovarian cyst    Complication of anesthesia    a.) prone to experience postoperative hypotension   COPD (chronic obstructive pulmonary disease) (HCC)    DDD (degenerative disc disease), cervical    DDD (degenerative disc disease), thoracolumbar    Depression    Diabetes mellitus type 2, controlled (HCC)    Diastolic dysfunction 06/29/2023   a.) TTE 06/29/2023: EF >55%, no RWMAs, G1DD, triv PR, mild MR/TR   Fatty liver    GERD (gastroesophageal reflux disease)    Gout    History of bilateral cataract extraction    History of kidney stones    Hypercholesteremia    Long term (current) use of aspirin    Long term current use of clopidogrel    Lumbar spinal stenosis    Lumbar spondylosis    Non-small cell lung cancer metastatic to adrenal gland (HCC)    a.) s/p LLL wedge resection  04/06/2021 --> pathology (+) for well differentiated adenocarcinoma (stage 1A) --> no adjuvant Tx; b.) recurrent stage IV adenocarcinoma 05/2022 --> Tx'd with  pembrolizumab   Osteoarthritis    Osteoporosis    Primary hypertension    Seasonal allergies    Sleep apnea    a.) no nocturnal PAP therapy since 2021   Vitamin D deficiency    Past Surgical History:  Procedure Laterality Date   ABDOMINAL AORTIC ENDOVASCULAR STENT GRAFT  03/27/2013   Dr. Levora Dredge   ABDOMINAL HYSTERECTOMY     Menometrorrhagia. Excessive bleeding. Unknown if cervix removed.    BILATERAL SALPINGOOPHORECTOMY  09/27/2023   CATARACT EXTRACTION W/PHACO Left 05/14/2023   Procedure: CATARACT EXTRACTION PHACO AND INTRAOCULAR LENS PLACEMENT (IOC) LEFT DIABETIC  OMIDRIA  19.40  01:34.8;  Surgeon: Estanislado Pandy, MD;  Location: [facility];  Service: Ophthalmology;  Laterality: Left;   CATARACT EXTRACTION W/PHACO Right 06/27/2023   Procedure: CATARACT EXTRACTION PHACO AND INTRAOCULAR LENS PLACEMENT (IOC) RIGHT DIABETIC  6.47  00:42.6;  Surgeon: Estanislado Pandy, MD;  Location: [facility];  Service: Ophthalmology;  Laterality: Right;   COLONOSCOPY WITH PROPOFOL N/A 01/05/2021   Procedure: COLONOSCOPY WITH PROPOFOL;  Surgeon: Pasty Spillers, MD;  Location: [facility] END[facility]COPY;  Service: Endoscopy;  Laterality: N/A;  CORONARY ANGIOPLASTY WITH STENT PLACEMENT  03/16/2009   Procedure: CORONARY ANGIOPLASTY WITH STENT PLACEMENT; Location: [facility]; Surgeons: Harold Hedge, MD (diagnostic) and Lorine Bears, MD (interventional)   ESOPHAGOGASTRODUODENOSCOPY (EGD) WITH PROPOFOL N/A 01/05/2021   Procedure: ESOPHAGOGASTRODUODENOSCOPY (EGD) WITH PROPOFOL;  Surgeon: Pasty Spillers, MD;  Location: [facility] ENDOSCOPY;  Service: Endoscopy;  Laterality: N/A;   EXTRACORPOREAL SHOCK WAVE LITHOTRIPSY Right 1999   INTERCOSTAL NERVE BLOCK Left 04/06/2021   Procedure: INTERCOSTAL NERVE BLOCK;  Surgeon:  Loreli Slot, MD;  Location: [facility] OR;  Service: Thoracic;  Laterality: Left;   IR IMAGING GUIDED PORT INSERTION  06/20/2022   LUNG LOBECTOMY Left 04/06/2021   robotic left lower lobectomy 04/06/2021 Dr. Dorris Fetch for Stage 1A adenocarcinoma   NODE DISSECTION Left 04/06/2021   Procedure: NODE DISSECTION;  Surgeon: Loreli Slot, MD;  Location: [facility] OR;  Service: Thoracic;  Laterality: Left;   TUBAL LIGATION     Family History  Problem Relation Age of Onset   Hypertension Mother    Coronary artery disease Mother    Heart attack Mother        acute   Cancer Mother    Alcohol abuse Father    Depression Father    Hypertension Father    Heart attack Father 37       acute   Alcohol abuse Sister    Hyperlipidemia Sister    Hypertension Sister    Cancer Sister 33   Heart attack Sister        x's 2   Coronary artery disease Sister 37       x's 2   Breast cancer Sister 9   Social History   Tobacco Use   Smoking status: Every Day    Current packs/day: 1.00    Average packs/day: 0.7 packs/day for 96.4 years (63.1 ttl pk-yrs)    Types: Cigarettes    Start date: 1973   Smokeless tobacco: Never   Tobacco comments:    12/23/19 states she quit for 9 months and then started back.   Vaping Use   Vaping status: Never Used  Substance Use Topics   Alcohol use: Not Currently   Drug use: No    Pertinent Clinical Results:  LABS:  Lab Results  Component Value Date   WBC 10.0 10/22/2023   HGB 15.4 (H) 10/22/2023   HCT 47.2 (H) 10/22/2023   MCV 87.1 10/22/2023   PLT 259 10/22/2023   Lab Results  Component Value Date   NA 140 10/22/2023   K 4.1 10/22/2023   CO2 24 10/22/2023   GLUCOSE 97 10/22/2023   BUN 17 10/22/2023   CREATININE 0.68 10/22/2023   CALCIUM 9.6 10/22/2023   EGFR 94 02/13/2022   GFRNONAA >60 10/22/2023   Component Date Value Ref Range Status   MRSA, PCR 10/29/2023 NEGATIVE  NEGATIVE Final   Staphylococcus aureus 10/29/2023 NEGATIVE  NEGATIVE Final    Comment: (NOTE) The Xpert SA Assay (FDA approved for NASAL specimens in patients 38 years of age and older), is one component of a comprehensive surveillance program. It is not intended to diagnose infection nor to guide or monitor treatment. Performed at [facility], [address] [place], [place] [address]    ABO/RH(D) 10/29/2023 A POS   Final   Antibody Screen 10/29/2023 NEG   Final   Sample Expiration 10/29/2023 11/12/2023,2359   Final   Extend sample reason 10/29/2023    Final  Value:NO TRANSFUSIONS OR PREGNANCY IN THE PAST 3 MONTHS Performed at [facility], [address] [place], [place] [address]     ECG: Date: 10/29/2023 Time ECG obtained: 0946 AM Rate: 70 bpm Rhythm:  Sinus rhythm with first-degree AV block Axis (leads I and aVF): Normal Intervals: PR 212 ms. QRS 86 ms. QTc 453 ms. ST segment and T wave changes: No evidence of acute ST segment elevation or depression.   Comparison: Similar to previous tracing obtained on 01/14/2023   IMAGING / PROCEDURES: CT ANGIO NECK W OR WO CONTRAST performed on 08/17/2023 Dense calcified plaque at the right carotid bifurcation and ICA bulb. Minimal diameter of the proximal ICA is 1 mm, consistent with 75% or greater stenosis. Calcified plaque at the left carotid bifurcation and ICA bulb. 30% stenosis of the ICA bulb. Moderate stenosis of the left subclavian artery origin from the arch. Chronic degenerative spondylosis with spinal stenosis at C4-5, C5-6 and C6-7. Aortic atherosclerosis  MYOCARDIAL PERFUSION IMAGING STUDY (LEXISCAN) performed on 07/16/2023 Normal left ventricular systolic function with a hyperdynamic LVEF of 70% No regional wall motion abnormalities SPECT images demonstrated a small reversible perfusion abnormality of mild intensity present in the apical region on stress images.  TID ratio 0.93 Findings consistent with mild apical ischemia  TRANSTHORACIC  ECHOCARDIOGRAM performed on 06/29/2023 Normal left ventricular systolic function with an EF of >55% No regional wall motion abnormalities Left ventricular diastolic Doppler parameters consistent with abnormal relaxation (G1DD). GLS -18.2% Right ventricular size and function normal Trivial PR Mild MR and TR RVSP = 24.7 mmHg Normal gradients; no valvular stenosis No pericardial effusion  CT CHEST ABDOMEN PELVIS W CONTRAST performed on 05/16/2023 Status post left lower lobectomy. No evidence of recurrent or metastatic disease in the chest. Unchanged small ground-glass nodules in the right lower lobe, including a 1.0 x 0.8 cm nodule in the dependent right lower lobe. These remain nonspecific, possibly infectious or inflammatory although small metachronous adenocarcinoma not excluded. Attention on follow-up. Left adrenal metastasis remains resolved following treatment. Significant interval enlargement of a multiseptated cystic lesion of the right ovary, now measuring 11.7 x 8.2 cm, previously 8.1 x 5.8 cm. 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 Emphysema and diffuse bilateral bronchial wall thickening. Background of very fine centrilobular nodularity, consistent with smoking-related respiratory bronchiolitis.] Aortobiiliac stent endograft repair. Coronary artery disease. Aortic atherosclerosis   MR LUMBAR SPINE W WO CONTRAST performed on 01/15/2023 Transitional lumbosacral anatomy. As on prior imaging, the transitional segment is assigned L5. No acute findings or evidence of osseous metastatic disease. Multilevel spondylosis has mildly progressed from previous MRI 03/28/2017. At L2-3, there is progressive multifactorial spinal stenosis with asymmetric narrowing of the right lateral recess and right foramen. Similar moderate multifactorial spinal stenosis at L3-4 with mild lateral recess and foraminal narrowing bilaterally. Similar mild multifactorial spinal stenosis at L4-5 with  asymmetric left lateral recess and left foraminal narrowing. A known right adnexal cystic mass is partially imaged, evaluated by ultrasound yesterday. Please refer to that report for follow-up recommendations.   VAS US CAROTID performed on 11/20/2022 Velocities in the right ICA are consistent with a 60-79% stenosis. The ECA appears >50% stenosed.  The extracranial vessels were near-normal with only minimal wall thickening or plaque.   Impression and Plan:  Lakinya Huner has been referred for pre-anesthesia review and clearance prior to her undergoing the planned anesthetic and procedural courses. Available labs, pertinent testing, and imaging results were personally reviewed by me in preparation for upcoming operative/procedural course. [facility]  medical record has been updated following extensive record review and patient interview with PAT staff.   This patient has been appropriately cleared by cardiology with an overall LOW risk of experiencing significant perioperative cardiovascular complications. Based on clinical review performed today (11/08/23), barring any significant acute changes in the patient's overall condition, it is anticipated that she will be able to proceed with the planned surgical intervention. Any acute changes in clinical condition may necessitate her procedure being postponed and/or cancelled. Patient will meet with anesthesia team (MD and/or CRNA) on the day of her procedure for preoperative evaluation/assessment. Questions regarding anesthetic course will be fielded at that time.   Pre-surgical instructions were reviewed with the patient during her PAT appointment, and questions were fielded to satisfaction by PAT clinical staff. She has been instructed on which medications that she will need to hold prior to surgery, as well as the ones that have been deemed safe/appropriate to take on the day of her procedure. As part of the general education provided by PAT, patient made  aware both verbally and in writing, that she would need to abstain from the use of any illegal substances during her perioperative course.  She was advised that failure to follow the provided instructions could necessitate case cancellation or result in serious perioperative complications up to and including death. Patient encouraged to contact PAT and/or her surgeon's office to discuss any questions or concerns that may arise prior to surgery; verbalized understanding.   Quentin Mulling, MSN, APRN, FNP-C, CEN [facility]  Perioperative Services Nurse Practitioner Phone: [phone] Fax: [phone] 11/08/23 11:58 AM  NOTE: This note has been prepared using Dragon dictation software. Despite my best ability to proofread, there is always the potential that unintentional transcriptional errors may still occur from this process.

## 2023-11-07 NOTE — Progress Notes (Signed)
Gynecologic Oncology Interval Visit   Referring Provider: Dr. Tiburcio Pea  Chief Complaint: Incidental Right Sided Complex Cystic Mass Subjective:  Laura Nielsen is a 67 y.o. G2P2 female s/p vaginal hysterectomy  for bleeding over 30 years ago (bilateral ovaries in situ) and history COPD, lung cancer s/p pulmonary lobectomy who is seen in consultation from Dr. Tiburcio Pea for incidental right sided complex cystic mass. She was seen by Dr. Johnnette Litter in October 2021 and felt to be low risk of ovarian malignancy. She returns for post op.   She underwent laparoscopic BSO, ovariolysis, and omental biopsy with Dr. Sonia Side at [facility] on 09/27/23.   A.  Right ovary and fallopian tube, salpingo-oophorectomy: - Ovary with mucinous cystadenoma (12.5 cm after deflation). - Fallopian tube: No pathologic diagnosis.   B.  Omentum, omentectomy: - omentum, negative for malignancy.   C.  Left ovary and fallopian tube, salpingo-oophorectomy: - Ovary with no pathologic diagnosis. - Fallopian tube with paratubal cyst. - Negative for malignancy.  She recovered from surgery well. Minimal pain following and di dnot require pain medication. Feels back to baseline.      Gynecologic History:  Laura Nielsen is a pleasant female s/p hysterectomy for bleeding over 30 years ago (bilateral ovaries in situ) who is seen in consultation incidental right sided 6 cm complex cystic mass.   She underwent Low dose CT/Lung Cancer Screening in 2021 which showed volume derived mean 9.8 mm nodule in the left lower lobe, increased in size. 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  No symptoms.   In view of incidental finding of ovarian mass and [place] was done Ultrasound: Right ovary: 6.9 x 5.2 x 5.6 normal appearing. Complex mass with anechoic fluid and thick  septations in the right ovary. No blood flow seen. Measures 6.33 x 5.16 x 4.62 cm. Left ovary not visualized. No free fluid. Uterus and cervix surgically removed.   CA 125 - 8.9 HE4- 94.6 Post menopausal ROMA 1.47 = 14% (normal)  Patient felt to be high risk for surgery in view of pulmonary issues and BMI and Dr. Tiburcio Pea referred her to gynecologic oncology for evaluation.   Surveillance recommended in view of benign characteristics of mass.    She was released to follow up with Dr. Tiburcio Pea and last saw him in July 2022. Repeat ultrasound and ca 125 was recommended at that time.   01/25/22  US pelvis for surveillance FINDINGS:  - UTERUS/CERVIX: Status post hysterectomy.  - OVARIES: The ovaries were seen well transvaginally. 5.3 x 4.2 x 5.1 cm cystic lesion with multiple mildly thickened septations noted in the right ovary, previously measured 4.8 x 4.0 x 5.3 cm. No mural nodularity, or papillary projections appreciated. Mild peripheral vascularity. Appropriate arterial inflow and venous outflow of the ovaries was documented on color and spectral Doppler imaging. The right ovary measured 5.8 x 5.6 x 5.2 cm. - OTHER: No abnormal pelvic free fluid. 5.3 cm cystic lesion with thickened septations, as above, minimally increased in size compared with 05/25/2021.  O-RADS ultrasound category: 3: Low Risk (1  - <10%).   May 07 2022-  CT scan highly concerning for recurrent/metastatic disease with-. New left adrenal metastasis;  Ground-glass and part solid nodules in the peripheral right lower lobe, unchanged from 11/07/2021 but slightly enlarged from 01/01/2018. Findings are suspicious for indolent adenocarcinoma.  F One- PFL1 =80%; KRAS G12C PET scan JUNE 20th- 2023-  Enlarged 4 cm hypermetabolic LEFT adrenal metastasis;  Suspicion of metastatic adenopathy to the LEFT hilum.  Part solid nodule in the RIGHT lower lobe without metabolic activity.    ADRENAL BIOPSY- CK7 POSITIVE ADENOCARCINOMA- There is  limited tissue remaining for ancillary testing, not likely  sufficient for NGS testing.    Jul 10 2022 - single agent Keytruda with excellent response  May 17 2023  CT CHEST, ABDOMEN, AND PELVIS   Lungs/Pleura: Status post left lower lobectomy. Diffuse bilateral bronchial wall thickening. Mild centrilobular and paraseptal emphysema. Unchanged small ground-glass nodules in the right lower lobe, including a 1.0 x 0.8 cm nodule in the dependent right lower lobe (series 4, image 100). Background of very fine centrilobular nodularity, most concentrated in the lung apices. No pleural effusion or pneumothorax.   No enlarged abdominal or pelvic lymph nodes.   Reproductive: Significant interval enlargement of a multi septated cystic lesion of the right ovary, now measuring 11.7 x 8.2 cm, previously 8.1 x 5.8 cm (series 2, image 103). Normal left ovary. Status post hysterectomy.    IMPRESSION: 1. Status post left lower lobectomy. No evidence of recurrent or metastatic disease in the chest. 2. Unchanged small ground-glass nodules in the right lower lobe, including a 1.0 x 0.8 cm nodule in the dependent right lower lobe. These remain nonspecific, possibly infectious or inflammatory although small metachronous adenocarcinoma not excluded. Attention on follow-up. 3. Left adrenal metastasis remains resolved following treatment.  4. Significant interval enlargement of a multiseptated cystic lesion of the right ovary, now measuring 11.7 x 8.2 cm, previously 8.1 x 5.8 cm. 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 5. Emphysema and diffuse bilateral bronchial wall thickening. Background of very fine  centrilobular nodularity, consistent with smoking-related respiratory bronchiolitis. 6. Aortobiiliac stent endograft repair. 7. Coronary artery disease.  CA125 = 15.8  HER4 = 123 (prior results 104.0)  Cardiology: Dr. Darrold Junker Patient being evaluated for surgical removal of right ovarian mass. 2D  echocardiogram 06/29/2023 revealed normal left ventricular function, with LVEF > 55%. Lexiscan Myoview 07/16/2023 revealed LVEF 70% with mild apical wall ischemia. In the absence of chest pain with mildly abnormal Lexiscan Myoview, the patient should be at low and acceptable cardiovascular risk for surgery.  1. Continue current medications 2. Counseled patient about low-sodium diet 3. DASH diet printed instructions given to patient 4. Counseled patient about low-cholesterol diet 5. Continue rosuvastatin for hyperlipidemia management  6. Low-fat and cholesterol diet printed instructions given to the patient 7. Advised patient to stop smoking 8. Proceed with surgery as deemed necessary 9. Continue bisoprolol pre-, peri and postoperatively 10. Return to clinic for follow-up in 3 months   She saw Dr. Aundria Rud for preoperative assessment. She had PFT's in 2022 that showed a preserved ratio of 70% with impaired spirometry consistent with PRISM in light of her smoking history. Her max eosinophil count was elevated at 600. He started her on triple therapy with ICS/LABA/LAMA (briztri) and assess her clinical response on follow up. Patient's imaging was reviewed and there is no sign of pembrolizumab associated lung toxicity noted. At the time of her surgery the total amount of lung resected was 18 cm. She continues to smoke. Counseled patient at length regarding smoking cessation. She is not ready to quit smoking yet. 18 points on Airscat (low risk), 4 points on Arozullah (low risk). He recommended post procedural CPAP should the patient be somnolent post op, and she could benefit from  post op nebulizers. PFTs were also obtained.   PFTs    Lung Cancer History Expand All Collapse All   Oncology History Overview Note   #MAY 2022- LLL nodule 2.3 cm Adenocarcinoma Stage IA; s/p lobectomy.  No adjuvant therapy  LUNG LOBECTOMY Left 04/06/2021     robotic left lower lobectomy 04/06/2021 Dr. Dorris Fetch for Stage  1A adenocarcinoma   NODE DISSECTION Left 04/06/2021     Procedure: NODE DISSECTION;  Surgeon: Loreli Slot, MD;  Location: [facility] OR;  Service: Thoracic;  Laterality: Left;     # CT scan 4th June 2023-highly concerning for recurrent/metastatic disease with-. New left adrenal metastasis;  Ground-glass and part solid nodules in the peripheral right lower lobe, unchanged from 11/07/2021 but slightly enlarged from 01/01/2018. Findings are suspicious for indolent adenocarcinoma.  F One- PFL1 =80%; KRAS G12C PET scan JUNE 20th- 2023-  Enlarged 4 cm hypermetabolic LEFT adrenal metastasis;  Suspicion of metastatic adenopathy to the LEFT hilum.  Part solid nodule in the RIGHT lower lobe without metabolic activity.    3 ADRENAL BIOPSY- CK7 POSITIVE ADENOCARCINOMA- There is limited tissue remaining for ancillary testing, not likely  sufficient for NGS testing.    # AUG 7th, 2023-single agent Keytruda.          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  08/31/2020   Complex ovarian cyst 08/31/2020   Fatty liver 08/09/2020   Aortic atherosclerosis (HCC) 07/17/2019   Abnormal MRI, lumbar spine (03/28/2017) 07/04/2017   Abnormal MRI, cervical spine (03/28/2017) 07/04/2017   Lumbar facet syndrome (Bilateral) (L>R) 04/26/2017   Lumbar facet hypertrophy (multilevel) (Bilateral) 04/26/2017   Neurogenic pain 04/26/2017   Musculoskeletal pain 04/26/2017   Chronic pain syndrome 03/08/2017   Chronic low back pain (1ry area of Pain) (Bilateral) (L>R) w/o sciatica 03/08/2017   Chronic neck pain (2ry area of  Pain) (Bilateral) (R>L) 03/08/2017   Cervicogenic headache (Right) 03/08/2017   Chronic upper back pain (3ry area of Pain) (Bilateral) (R>L) 03/08/2017   Osteoarthritis of hip (Bilateral) (L>R) 03/08/2017   Cervical central spinal stenosis 03/08/2017   Cervical spondylosis with radiculopathy (Right) (C5) 03/08/2017   Lumbar spondylosis 03/08/2017   Atherosclerosis of native arteries of extremity with intermittent claudication (HCC) 02/22/2017   Gout 02/16/2016   Carotid artery narrowing 05/14/2015   Claudication (HCC) 05/14/2015   CAFL (chronic airflow limitation) (HCC) 05/14/2015   Emphysema lung (HCC) 05/14/2015   Acid reflux 05/14/2015   Urinary incontinence 05/14/2015   Pins and needles sensation 05/14/2015   Obstructive apnea 05/14/2015   History of abnormal cervical Papanicolaou smear 05/14/2015   Peripheral blood vessel disorder (HCC) 05/14/2015   B12 deficiency 05/14/2015   Vitamin D insufficiency 05/14/2015   Recurrent major depressive disorder, in partial remission (HCC) 05/14/2015   Secondary osteoarthritis of multiple sites 05/14/2015   S/P AAA repair 05/26/2014   Aortic heart valve narrowing 01/31/2013   Malaise and fatigue 09/26/2012   CAD in native artery 03/17/2009   Diabetes mellitus, type 2 (HCC) 02/05/2009   Hypercholesteremia 06/24/2008   Allergic rhinitis 10/25/2007   Airway hyperreactivity 10/25/2007   Smoking  greater than 30 pack years 10/25/2007   Narrowing of intervertebral disc space 10/25/2007   Primary hypertension 10/25/2007   Arthritis, degenerative 10/25/2007    Past Medical History: Past Medical History:  Diagnosis Date   AAA (abdominal aortic aneurysm) (HCC)    a.) s/p EVAR stent graft repair 03/27/2013   Adnexal mass    Aortic atherosclerosis (HCC)    Arthritis    Asthma    Atherosclerosis of native artery of both lower extremities with intermittent claudication (HCC)    B12 deficiency    Bilateral carotid artery stenosis    a.)  doppler 05/06/2020 and 06/30/2021: 1-39% BICA; b.) doppler 11/20/2022: 60-79% RICA; c.) CTA neck 08/17/2023: >75% RICA, 30% LICA   CAD (coronary artery disease) 03/16/2009   a.) LHC/PCI 03/16/2009: 25% mLCx, 70% mRCA (3 x 18 mm Xience V DES)   Cancer of left adrenal gland (HCC) 06/20/2022   a.) CNB 06/20/2022 -> pathology (+) for CK7 (TTF-1 -) adenocarcinoma   Centrilobular emphysema (HCC)    Cervical spinal stenosis    Cervical spondylosis with radiculopathy    Chronic back pain    Colon polyp    Complex ovarian cyst    Complication of anesthesia    a.) prone to experience postoperative hypotension   COPD (chronic obstructive pulmonary disease) (HCC)    DDD (degenerative disc disease), cervical    DDD (degenerative disc disease), thoracolumbar    Depression    Diabetes mellitus type 2, controlled (HCC)    Fatty liver    GERD (gastroesophageal reflux disease)    Gout    History of bilateral cataract extraction    History of kidney stones    Hypercholesteremia    Long term (current) use of aspirin    Long term current use of clopidogrel    Lumbar spinal stenosis    Lumbar spondylosis    Non-small cell lung cancer metastatic to adrenal gland (HCC)    a.) s/p LLL wedge resection 04/06/2021 --> pathology (+) for well differentiated adenocarcinoma (stage 1A) --> no adjuvant Tx; b.) recurrent stage IV adenocarcinoma 05/2022 --> Tx'd with  pembrolizumab   Osteoarthritis    Osteoporosis    Primary hypertension    Seasonal allergies    Sleep apnea    a.) no nocturnal PAP therapy since 2021   Vitamin D deficiency     Past Surgical History: Past Surgical History:  Procedure Laterality Date   ABDOMINAL AORTIC ENDOVASCULAR STENT GRAFT  03/27/2013   Dr. Levora Dredge   ABDOMINAL HYSTERECTOMY     Menometrorrhagia. Excessive bleeding. Unknown if cervix removed.    BILATERAL SALPINGOOPHORECTOMY  09/27/2023   CATARACT EXTRACTION W/PHACO Left 05/14/2023   Procedure: CATARACT EXTRACTION  PHACO AND INTRAOCULAR LENS PLACEMENT (IOC) LEFT DIABETIC  OMIDRIA  19.40  01:34.8;  Surgeon: Estanislado Pandy, MD;  Location[facility];  Service: Ophthalmology;  Laterality: Left;   CATARACT EXTRACTION W/PHACO Right 06/27/2023   Procedure: CATARACT EXTRACTION PHACO AND INTRAOCULAR LENS PLACEMENT (IOC) RIGHT DIABETIC  6.47  00:42.6;  Surgeon: Estanislado Pandy, MD;  Location: [facility];  Service: Ophthalmology;  Laterality: Right;   COLONOSCOPY WITH PROPOFOL N/A 01/05/2021   Procedure: COLONOSCOPY WITH PROPOFOL;  Surgeon: Pasty Spillers, MD;  Location: [facility];  Service: Endoscopy;  Laterality: N/A;   CORONARY ANGIOPLASTY WITH STENT PLACEMENT  03/16/2009   Procedure: CORONARY ANGIOPLASTY WITH STENT PLACEMENT; Location: [facility]; Surgeons: Harold Hedge, MD (diagnostic) and Lorine Bears, MD (interventional)   ESOPHAGOGASTRODUODENOSCOPY (EGD) WITH PROPOFOL N/A  01/05/2021   Procedure: ESOPHAGOGASTRODUODENOSCOPY (EGD) WITH PROPOFOL;  Surgeon: Pasty Spillers, MD;  Location: [facility];  Service: Endoscopy;  Laterality: N/A;   EXTRACORPOREAL SHOCK WAVE LITHOTRIPSY Right 1999   INTERCOSTAL NERVE BLOCK Left 04/06/2021   Procedure: INTERCOSTAL NERVE BLOCK;  Surgeon: Loreli Slot, MD;  Location[facility] OR;  Service: Thoracic;  Laterality: Left;   IR IMAGING GUIDED PORT INSERTION  06/20/2022   LUNG LOBECTOMY Left 04/06/2021   robotic left lower lobectomy 04/06/2021 Dr. Dorris Fetch for Stage 1A adenocarcinoma   NODE DISSECTION Left 04/06/2021   Procedure: NODE DISSECTION;  Surgeon: Loreli Slot, MD;  Location: [facility] OR;  Service: Thoracic;  Laterality: Left;   TUBAL LIGATION       OB History:  OB History  Gravida Para Term Preterm AB Living  2 2          SAB IAB Ectopic Multiple Live Births               # Outcome Date GA Lbr Len/2nd Weight Sex Type Anes PTL Lv  2 Para           1 Para             Family History: Family History   Problem Relation Age of Onset   Hypertension Mother    Coronary artery disease Mother    Heart attack Mother        acute   Cancer Mother    Alcohol abuse Father    Depression Father    Hypertension Father    Heart attack Father 54       acute   Alcohol abuse Sister    Hyperlipidemia Sister    Hypertension Sister    Cancer Sister 58   Heart attack Sister        x's 2   Coronary artery disease Sister 11       x's 2   Breast cancer Sister 27    Social History: Social History   Socioeconomic History   Marital status: Divorced    Spouse name: Not on file   Number of children: 2   Years of education: H/S   Highest education level: High school graduate  Occupational History   Occupation: Disabled   Occupation: retired  Tobacco Use   Smoking status: Every Day    Current packs/day: 1.00    Average packs/day: 0.7 packs/day for 96.4 years (63.0 ttl pk-yrs)    Types: Cigarettes    Start date: 1973   Smokeless tobacco: Never   Tobacco comments:    12/23/19 states she quit for 9 months and then started back.   Vaping Use   Vaping status: Never Used  Substance and Sexual Activity   Alcohol use: Not Currently   Drug use: No   Sexual activity: Not on file  Other Topics Concern   Not on file  Social History Narrative   Hx of smoking; quit prior to lung surgery then started back. Lives in [place] alone. Used to work in [employer], [employer]- Facilities manager. On disability sec to spinal pain.    Social Determinants of Health   Financial Resource Strain: Medium Risk (02/16/2023)   Overall Financial Resource Strain (CARDIA)    Difficulty of Paying Living Expenses: Somewhat hard  Food Insecurity: Patient Declined (02/16/2023)   Hunger Vital Sign    Worried About Running Out of Food in the Last Year: Patient declined    Ran Out of Food in the Last Year: Patient declined  Transportation Needs: No Transportation Needs (02/16/2023)   PRAPARE - Administrator, Civil Service  (Medical): No    Lack of Transportation (Non-Medical): No  Physical Activity: Unknown (02/16/2023)   Exercise Vital Sign    Days of Exercise per Week: 0 days    Minutes of Exercise per Session: Patient declined  Stress: Stress Concern Present (02/16/2023)   [employer] of Occupational Health - Occupational Stress Questionnaire    Feeling of Stress : Rather much  Social Connections: Unknown (02/16/2023)   Social Connection and Isolation Panel [NHANES]    Frequency of Communication with Friends and Family: More than three times a week    Frequency of Social Gatherings with Friends and Family: Once a week    Attends Religious Services: Not on Insurance claims handler of Clubs or Organizations: No    Attends Banker Meetings: Never    Marital Status: Patient declined  Intimate Partner Violence: Not At Risk (02/20/2023)   Humiliation, Afraid, Rape, and Kick questionnaire    Fear of Current or Ex-Partner: No    Emotionally Abused: No    Physically Abused: No    Sexually Abused: No    Allergies: Allergies  Allergen Reactions   Atorvastatin Other (See Comments)    Elevated blood sugar    Omeprazole Nausea And Vomiting   Bupropion Nausea Only    Only on the 150mg  tablets, but the 100mg  didn't help with smoking    Current Medications: Current Outpatient Medications  Medication Sig Dispense Refill   acetaminophen (TYLENOL) 500 MG tablet Take 500 mg by mouth every 6 (six) hours as needed for moderate pain (pain score 4-6).     allopurinol (ZYLOPRIM) 100 MG tablet Take 1 tablet (100 mg total) by mouth daily. 90 tablet 0   aspirin 81 MG tablet Take 81 mg by mouth daily.      bisoprolol (ZEBETA) 10 MG tablet Take 1 tablet (10 mg total) by mouth daily. 90 tablet 1   Budeson-Glycopyrrol-Formoterol (BREZTRI AEROSPHERE) 160-9-4.8 MCG/ACT AERO Inhale 2 puffs into the lungs in the morning and at bedtime. 1284 g 11   clopidogrel (PLAVIX) 75 MG tablet Take 1 tablet (75 mg total) by  mouth daily. 90 tablet 4   ibuprofen (ADVIL) 200 MG tablet Take 200 mg by mouth 2 (two) times daily as needed (headaches).     lidocaine-prilocaine (EMLA) cream Apply on the port. 30 -45 min  prior to port access. 30 g 3   lisinopril-hydrochlorothiazide (ZESTORETIC) 20-12.5 MG tablet Take 1 tablet by mouth daily. (Patient taking differently: Take 0.5 tablets by mouth daily.) 90 tablet 0   magnesium gluconate (MAGONATE) 500 MG tablet Take 500 mg by mouth at bedtime.     pantoprazole (PROTONIX) 20 MG tablet Take 1 tablet (20 mg total) by mouth daily. 90 tablet 0   Polyethyl Glyc-Propyl Glyc PF (SYSTANE PRESERVATIVE FREE) 0.4-0.3 % SOLN Place 1 drop into both eyes daily as needed (dry eyes).     PROAIR HFA 108 (90 Base) MCG/ACT inhaler Inhale 2 puffs into the lungs every 6 (six) hours as needed for wheezing or shortness of breath. 8.5 g 4   rosuvastatin (CRESTOR) 20 MG tablet Take 1 tablet (20 mg total) by mouth daily. 90 tablet 0   Semaglutide, 1 MG/DOSE, (OZEMPIC, 1 MG/DOSE,) 4 MG/3ML SOPN Inject 1 mg into the skin once a week. 9 mL 1   No current facility-administered medications for this visit.   Facility-Administered Medications Ordered  in Other Visits  Medication Dose Route Frequency Provider Last Rate Last Admin   heparin lock flush 100 UNIT/ML injection            Review of Systems General:  no complaints Skin: no complaints Eyes: no complaints HEENT: no complaints Breasts: no complaints Pulmonary: no complaints Cardiac: no complaints Gastrointestinal: no complaints Genitourinary/Sexual: no complaints Ob/Gyn: no complaints Musculoskeletal: no complaints Hematology: no complaints Neurologic/Psych: no complaints   Objective:  Physical Examination:  BP (!) 146/67   Pulse (!) 45   Temp 97.6 F (36.4 C)   Resp 20   Wt 196 lb (88.9 kg)   SpO2 100%   BMI 29.80 kg/m     ECOG Performance Status: 2 - Symptomatic, <50% confined to bed  GENERAL: Patient is a elderly appearing  female in no acute distress. She is able to ambulate independently but has an altered gait LUNGS: Normal respiratory effort ABDOMEN:  Soft, nontender, nondistended.  No ascites.  No obvious masses. Surgical incisions are healing normally.  EXTREMITIES:  No peripheral edema.   NEURO:  Nonfocal. Well oriented.  Appropriate affect. Pelvic: not indicated  Lab Review Per hpi  Radiologic Imaging: No imaging on site.     Assessment:  Azhane Hansler is a 67 y.o. female diagnosed with incidentally found enlarging 11 cm right adnexal cystic mass in 2021 on imaging done for follow up of lung nodule seen on lung cancer screening scan.   On prior imaging Adnexal mass is PET negative and US shows it to be multicystic with septations with prior normal CA125 but elevated HE4.  She is asymptomatic, but strongly desires surgical management. Cleared by cardiology and pulmonology. Underwent lap-BSO, ovariolysis and omental biopsy on 09/27/23. Pathology was benign, consistent with mucinous cystoadenoma. She has recovered well.   Cardiology: [facility] 07/16/2023 revealed LVEF 70% with mild apical wall ischemia. In the absence of chest pain with mildly abnormal [facility], cardiology cleared patient as low and acceptable risk.  Prior hysterectomy for bleeding 35 years ago.   Resection of lung cancer 1/23. PFTs 2024 Airway obstruction and moderate/severe loss of diffusion capacity.   Medical co-morbidities complicating care: smoker, chronic back pain, There is no height or weight on file to calculate BMI., aortic valve narrowing on Plavix , depression, fatty liver, CAD, and smoker  Plan:   Problem List Items Addressed This Visit   None   Given benign pathology, she can follow up with pcp for ongoing health maintenance and wellness as well as management of her other medical conditions. Should she develop any concerning symptoms, we can see her back in clinic in the future.   Thank you for allowing [place]  to participate in her care.   Laura Dooms, NP

## 2023-11-08 ENCOUNTER — Encounter: Payer: Self-pay | Admitting: Vascular Surgery

## 2023-11-08 MED ORDER — CHLORHEXIDINE GLUCONATE CLOTH 2 % EX PADS: 6.0000 | MEDICATED_PAD | Freq: Once | CUTANEOUS | Status: DC

## 2023-11-08 MED ORDER — CHLORHEXIDINE GLUCONATE 0.12 % MT SOLN
15.0000 mL | Freq: Once | OROMUCOSAL | Status: AC
  Administered 2023-11-09: 15 mL via OROMUCOSAL

## 2023-11-08 MED ORDER — SODIUM CHLORIDE 0.9 % IV SOLN: INTRAVENOUS | Status: DC

## 2023-11-08 MED ORDER — CEFAZOLIN SODIUM-DEXTROSE 2-4 GM/100ML-% IV SOLN
2.0000 g | INTRAVENOUS | Status: AC
  Administered 2023-11-09: 2 g via INTRAVENOUS

## 2023-11-08 MED ORDER — ORAL CARE MOUTH RINSE: 15.0000 mL | Freq: Once | OROMUCOSAL | Status: AC

## 2023-11-08 MED ORDER — CHLORHEXIDINE GLUCONATE CLOTH 2 % EX PADS
6.0000 | MEDICATED_PAD | Freq: Once | CUTANEOUS | Status: AC
  Administered 2023-11-09: 6 via TOPICAL

## 2023-11-09 ENCOUNTER — Inpatient Hospital Stay: Payer: Medicare Other | Admitting: Urgent Care

## 2023-11-09 ENCOUNTER — Other Ambulatory Visit: Payer: Self-pay

## 2023-11-09 ENCOUNTER — Encounter
Admission: RE | Disposition: A | Discharge status: Home / Self Care | Payer: Self-pay | Source: Home / Self Care | Attending: Vascular Surgery

## 2023-11-09 ENCOUNTER — Encounter: Payer: Self-pay | Admitting: Vascular Surgery

## 2023-11-09 ENCOUNTER — Inpatient Hospital Stay
Admission: RE | Admit: 2023-11-09 | Discharge: 2023-11-10 | DRG: 038 | Disposition: A | Discharge status: Home / Self Care | Payer: Medicare Other | Attending: Vascular Surgery | Admitting: Vascular Surgery

## 2023-11-09 DIAGNOSIS — Z7951 Long term (current) use of inhaled steroids: Secondary | ICD-10-CM

## 2023-11-09 DIAGNOSIS — K760 Fatty (change of) liver, not elsewhere classified: Secondary | ICD-10-CM | POA: Diagnosis present

## 2023-11-09 DIAGNOSIS — Z803 Family history of malignant neoplasm of breast: Secondary | ICD-10-CM

## 2023-11-09 DIAGNOSIS — Z7982 Long term (current) use of aspirin: Secondary | ICD-10-CM | POA: Diagnosis not present

## 2023-11-09 DIAGNOSIS — E7800 Pure hypercholesterolemia, unspecified: Secondary | ICD-10-CM | POA: Diagnosis present

## 2023-11-09 DIAGNOSIS — I6523 Occlusion and stenosis of bilateral carotid arteries: Secondary | ICD-10-CM | POA: Diagnosis not present

## 2023-11-09 DIAGNOSIS — I2510 Atherosclerotic heart disease of native coronary artery without angina pectoris: Secondary | ICD-10-CM | POA: Diagnosis not present

## 2023-11-09 DIAGNOSIS — K219 Gastro-esophageal reflux disease without esophagitis: Secondary | ICD-10-CM | POA: Diagnosis present

## 2023-11-09 DIAGNOSIS — Z83438 Family history of other disorder of lipoprotein metabolism and other lipidemia: Secondary | ICD-10-CM | POA: Diagnosis not present

## 2023-11-09 DIAGNOSIS — F17210 Nicotine dependence, cigarettes, uncomplicated: Secondary | ICD-10-CM | POA: Diagnosis present

## 2023-11-09 DIAGNOSIS — Z01812 Encounter for preprocedural laboratory examination: Secondary | ICD-10-CM

## 2023-11-09 DIAGNOSIS — C7972 Secondary malignant neoplasm of left adrenal gland: Secondary | ICD-10-CM | POA: Diagnosis present

## 2023-11-09 DIAGNOSIS — M48061 Spinal stenosis, lumbar region without neurogenic claudication: Secondary | ICD-10-CM | POA: Diagnosis present

## 2023-11-09 DIAGNOSIS — Z818 Family history of other mental and behavioral disorders: Secondary | ICD-10-CM

## 2023-11-09 DIAGNOSIS — E1151 Type 2 diabetes mellitus with diabetic peripheral angiopathy without gangrene: Secondary | ICD-10-CM | POA: Diagnosis present

## 2023-11-09 DIAGNOSIS — Z90710 Acquired absence of both cervix and uterus: Secondary | ICD-10-CM

## 2023-11-09 DIAGNOSIS — Z7985 Long-term (current) use of injectable non-insulin antidiabetic drugs: Secondary | ICD-10-CM

## 2023-11-09 DIAGNOSIS — J432 Centrilobular emphysema: Secondary | ICD-10-CM | POA: Diagnosis present

## 2023-11-09 DIAGNOSIS — J4489 Other specified chronic obstructive pulmonary disease: Secondary | ICD-10-CM | POA: Diagnosis present

## 2023-11-09 DIAGNOSIS — Z811 Family history of alcohol abuse and dependence: Secondary | ICD-10-CM | POA: Diagnosis not present

## 2023-11-09 DIAGNOSIS — I6521 Occlusion and stenosis of right carotid artery: Principal | ICD-10-CM | POA: Diagnosis present

## 2023-11-09 DIAGNOSIS — M109 Gout, unspecified: Secondary | ICD-10-CM | POA: Diagnosis present

## 2023-11-09 DIAGNOSIS — M810 Age-related osteoporosis without current pathological fracture: Secondary | ICD-10-CM | POA: Diagnosis present

## 2023-11-09 DIAGNOSIS — Z955 Presence of coronary angioplasty implant and graft: Secondary | ICD-10-CM | POA: Diagnosis not present

## 2023-11-09 DIAGNOSIS — Z7902 Long term (current) use of antithrombotics/antiplatelets: Secondary | ICD-10-CM

## 2023-11-09 DIAGNOSIS — Z85118 Personal history of other malignant neoplasm of bronchus and lung: Secondary | ICD-10-CM

## 2023-11-09 DIAGNOSIS — I10 Essential (primary) hypertension: Secondary | ICD-10-CM | POA: Diagnosis not present

## 2023-11-09 DIAGNOSIS — I7143 Infrarenal abdominal aortic aneurysm, without rupture: Secondary | ICD-10-CM | POA: Diagnosis present

## 2023-11-09 DIAGNOSIS — I959 Hypotension, unspecified: Secondary | ICD-10-CM | POA: Diagnosis not present

## 2023-11-09 DIAGNOSIS — I70213 Atherosclerosis of native arteries of extremities with intermittent claudication, bilateral legs: Secondary | ICD-10-CM | POA: Diagnosis present

## 2023-11-09 DIAGNOSIS — Z8249 Family history of ischemic heart disease and other diseases of the circulatory system: Secondary | ICD-10-CM | POA: Diagnosis not present

## 2023-11-09 DIAGNOSIS — M5135 Other intervertebral disc degeneration, thoracolumbar region: Secondary | ICD-10-CM | POA: Diagnosis present

## 2023-11-09 DIAGNOSIS — R001 Bradycardia, unspecified: Secondary | ICD-10-CM | POA: Diagnosis not present

## 2023-11-09 HISTORY — DX: Spinal stenosis, cervical region: M48.02

## 2023-11-09 HISTORY — DX: Spinal stenosis, lumbar region without neurogenic claudication: M48.061

## 2023-11-09 HISTORY — DX: Other cervical disc degeneration, unspecified cervical region: M50.30

## 2023-11-09 HISTORY — DX: Long term (current) use of aspirin: Z79.82

## 2023-11-09 HISTORY — DX: Chronic obstructive pulmonary disease, unspecified: J44.9

## 2023-11-09 HISTORY — DX: Long term (current) use of antithrombotics/antiplatelets: Z79.02

## 2023-11-09 HISTORY — DX: Cataract extraction status, left eye: Z98.41

## 2023-11-09 HISTORY — DX: Other intervertebral disc degeneration, thoracolumbar region: M51.35

## 2023-11-09 HISTORY — PX: ENDARTERECTOMY: SHX5162

## 2023-11-09 HISTORY — DX: Secondary malignant neoplasm of unspecified adrenal gland: C79.70

## 2023-11-09 HISTORY — DX: Other seasonal allergic rhinitis: J30.2

## 2023-11-09 HISTORY — DX: Cataract extraction status, right eye: Z98.42

## 2023-11-09 HISTORY — DX: Malignant neoplasm of unspecified part of unspecified bronchus or lung: C34.90

## 2023-11-09 LAB — GLUCOSE, CAPILLARY
Glucose-Capillary: 93 mg/dL (ref 70–99)
Glucose-Capillary: 112 mg/dL — ABNORMAL HIGH (ref 70–99)
Glucose-Capillary: 137 mg/dL — ABNORMAL HIGH (ref 70–99)
Glucose-Capillary: 175 mg/dL — ABNORMAL HIGH (ref 70–99)

## 2023-11-09 LAB — HEMOGLOBIN A1C
Hgb A1c MFr Bld: 5.4 % (ref 4.8–5.6)
Mean Plasma Glucose: 108.28 mg/dL

## 2023-11-09 LAB — MRSA NEXT GEN BY PCR, NASAL: MRSA by PCR Next Gen: NOT DETECTED

## 2023-11-09 SURGERY — ENDARTERECTOMY, CAROTID
Anesthesia: General | Laterality: Right

## 2023-11-09 MED ORDER — HEPARIN SODIUM (PORCINE) 1000 UNIT/ML IJ SOLN
INTRAMUSCULAR | Status: AC
  Filled 2023-11-09: qty 10

## 2023-11-09 MED ORDER — CEFAZOLIN SODIUM-DEXTROSE 2-4 GM/100ML-% IV SOLN
INTRAVENOUS | Status: AC
  Filled 2023-11-09: qty 100

## 2023-11-09 MED ORDER — OXYCODONE HCL 5 MG PO TABS
5.0000 mg | ORAL_TABLET | ORAL | Status: DC | PRN
  Administered 2023-11-09: 5 mg via ORAL
  Filled 2023-11-09: qty 1

## 2023-11-09 MED ORDER — DROPERIDOL 2.5 MG/ML IJ SOLN: 0.6250 mg | Freq: Once | INTRAMUSCULAR | Status: DC | PRN

## 2023-11-09 MED ORDER — CHLORHEXIDINE GLUCONATE CLOTH 2 % EX PADS: 6.0000 | MEDICATED_PAD | Freq: Every day | CUTANEOUS | Status: DC

## 2023-11-09 MED ORDER — MIDAZOLAM HCL 5 MG/5ML IJ SOLN
INTRAMUSCULAR | Status: DC | PRN
  Administered 2023-11-09 (×2): 1 mg via INTRAVENOUS

## 2023-11-09 MED ORDER — NITROGLYCERIN IN D5W 200-5 MCG/ML-% IV SOLN
INTRAVENOUS | Status: AC
  Filled 2023-11-09: qty 250

## 2023-11-09 MED ORDER — BUDESON-GLYCOPYRROL-FORMOTEROL 160-9-4.8 MCG/ACT IN AERO: 2.0000 | INHALATION_SPRAY | Freq: Two times a day (BID) | RESPIRATORY_TRACT | Status: DC

## 2023-11-09 MED ORDER — ALUM & MAG HYDROXIDE-SIMETH 200-200-20 MG/5ML PO SUSP: 15.0000 mL | ORAL | Status: DC | PRN

## 2023-11-09 MED ORDER — CHLORHEXIDINE GLUCONATE 0.12 % MT SOLN
OROMUCOSAL | Status: AC
  Filled 2023-11-09: qty 15

## 2023-11-09 MED ORDER — INSULIN ASPART 100 UNIT/ML IJ SOLN: 0.0000 [IU] | Freq: Three times a day (TID) | INTRAMUSCULAR | Status: DC

## 2023-11-09 MED ORDER — ACETAMINOPHEN 650 MG RE SUPP: 325.0000 mg | RECTAL | Status: DC | PRN

## 2023-11-09 MED ORDER — IPRATROPIUM-ALBUTEROL 0.5-2.5 (3) MG/3ML IN SOLN
3.0000 mL | Freq: Once | RESPIRATORY_TRACT | Status: AC
  Administered 2023-11-09: 3 mL via RESPIRATORY_TRACT

## 2023-11-09 MED ORDER — PANTOPRAZOLE SODIUM 40 MG IV SOLR
40.0000 mg | Freq: Every day | INTRAVENOUS | Status: DC
  Administered 2023-11-09: 40 mg via INTRAVENOUS
  Filled 2023-11-09: qty 10

## 2023-11-09 MED ORDER — LABETALOL HCL 5 MG/ML IV SOLN: 10.0000 mg | INTRAVENOUS | Status: DC | PRN

## 2023-11-09 MED ORDER — FENTANYL CITRATE (PF) 100 MCG/2ML IJ SOLN
INTRAMUSCULAR | Status: AC
  Filled 2023-11-09: qty 2

## 2023-11-09 MED ORDER — ACETAMINOPHEN 325 MG PO TABS
325.0000 mg | ORAL_TABLET | ORAL | Status: DC | PRN
  Administered 2023-11-10 (×2): 650 mg via ORAL
  Filled 2023-11-09 (×2): qty 2

## 2023-11-09 MED ORDER — ACETAMINOPHEN 10 MG/ML IV SOLN
INTRAVENOUS | Status: AC
  Filled 2023-11-09: qty 100

## 2023-11-09 MED ORDER — FIBRIN SEALANT 2 ML SINGLE DOSE KIT
PACK | CUTANEOUS | Status: DC | PRN
  Administered 2023-11-09: 4 mL via TOPICAL

## 2023-11-09 MED ORDER — ASPIRIN 81 MG PO TBEC
81.0000 mg | DELAYED_RELEASE_TABLET | Freq: Every day | ORAL | Status: DC
  Administered 2023-11-10: 81 mg via ORAL
  Filled 2023-11-09: qty 1

## 2023-11-09 MED ORDER — PROPOFOL 10 MG/ML IV BOLUS
INTRAVENOUS | Status: DC | PRN
  Administered 2023-11-09: 130 mg via INTRAVENOUS
  Administered 2023-11-09: 20 mg via INTRAVENOUS
  Administered 2023-11-09: 30 mg via INTRAVENOUS

## 2023-11-09 MED ORDER — VISTASEAL 4 ML SINGLE DOSE KIT
PACK | CUTANEOUS | Status: AC
  Filled 2023-11-09: qty 4

## 2023-11-09 MED ORDER — HEPARIN SODIUM (PORCINE) 1000 UNIT/ML IJ SOLN
INTRAMUSCULAR | Status: DC | PRN
  Administered 2023-11-09: 7000 [IU] via INTRAVENOUS

## 2023-11-09 MED ORDER — DEXMEDETOMIDINE HCL IN NACL 80 MCG/20ML IV SOLN
INTRAVENOUS | Status: AC
  Filled 2023-11-09: qty 20

## 2023-11-09 MED ORDER — HYDROCHLOROTHIAZIDE 12.5 MG PO TABS
12.5000 mg | ORAL_TABLET | Freq: Every day | ORAL | Status: DC
  Filled 2023-11-09: qty 1

## 2023-11-09 MED ORDER — ONDANSETRON HCL 4 MG/2ML IJ SOLN: 4.0000 mg | Freq: Four times a day (QID) | INTRAMUSCULAR | Status: DC | PRN

## 2023-11-09 MED ORDER — ALBUTEROL SULFATE (2.5 MG/3ML) 0.083% IN NEBU: 2.5000 mg | INHALATION_SOLUTION | Freq: Four times a day (QID) | RESPIRATORY_TRACT | Status: DC | PRN

## 2023-11-09 MED ORDER — SUGAMMADEX SODIUM 200 MG/2ML IV SOLN
INTRAVENOUS | Status: DC | PRN
  Administered 2023-11-09: 200 mg via INTRAVENOUS

## 2023-11-09 MED ORDER — ROCURONIUM BROMIDE 10 MG/ML (PF) SYRINGE
PREFILLED_SYRINGE | INTRAVENOUS | Status: AC
  Filled 2023-11-09: qty 10

## 2023-11-09 MED ORDER — CLOPIDOGREL BISULFATE 75 MG PO TABS
75.0000 mg | ORAL_TABLET | Freq: Every day | ORAL | Status: DC
  Administered 2023-11-09 – 2023-11-10 (×2): 75 mg via ORAL
  Filled 2023-11-09 (×2): qty 1

## 2023-11-09 MED ORDER — INSULIN ASPART 100 UNIT/ML IJ SOLN: 0.0000 [IU] | Freq: Every day | INTRAMUSCULAR | Status: DC

## 2023-11-09 MED ORDER — LABETALOL HCL 5 MG/ML IV SOLN
INTRAVENOUS | Status: DC | PRN
  Administered 2023-11-09: 5 mg via INTRAVENOUS

## 2023-11-09 MED ORDER — LIDOCAINE HCL (CARDIAC) PF 100 MG/5ML IV SOSY
PREFILLED_SYRINGE | INTRAVENOUS | Status: DC | PRN
  Administered 2023-11-09: 80 mg via INTRAVENOUS

## 2023-11-09 MED ORDER — ONDANSETRON HCL 4 MG/2ML IJ SOLN
INTRAMUSCULAR | Status: DC | PRN
  Administered 2023-11-09: 4 mg via INTRAVENOUS

## 2023-11-09 MED ORDER — ROCURONIUM BROMIDE 100 MG/10ML IV SOLN
INTRAVENOUS | Status: DC | PRN
  Administered 2023-11-09: 50 mg via INTRAVENOUS

## 2023-11-09 MED ORDER — PROPOFOL 10 MG/ML IV BOLUS
INTRAVENOUS | Status: AC
  Filled 2023-11-09: qty 20

## 2023-11-09 MED ORDER — ACETAMINOPHEN 10 MG/ML IV SOLN
INTRAVENOUS | Status: DC | PRN
  Administered 2023-11-09: 1000 mg via INTRAVENOUS

## 2023-11-09 MED ORDER — FENTANYL CITRATE (PF) 100 MCG/2ML IJ SOLN
25.0000 ug | INTRAMUSCULAR | Status: AC | PRN
  Administered 2023-11-09 (×6): 25 ug via INTRAVENOUS

## 2023-11-09 MED ORDER — HEPARIN SODIUM (PORCINE) 5000 UNIT/ML IJ SOLN
INTRAMUSCULAR | Status: AC
  Filled 2023-11-09: qty 1

## 2023-11-09 MED ORDER — ONDANSETRON HCL 4 MG/2ML IJ SOLN
4.0000 mg | Freq: Four times a day (QID) | INTRAMUSCULAR | Status: DC | PRN
Start: 2023-11-09 — End: 2023-11-10

## 2023-11-09 MED ORDER — POTASSIUM CHLORIDE IN NACL 20-0.9 MEQ/L-% IV SOLN
INTRAVENOUS | Status: AC
  Filled 2023-11-09: qty 1000

## 2023-11-09 MED ORDER — ROSUVASTATIN CALCIUM 10 MG PO TABS
20.0000 mg | ORAL_TABLET | Freq: Every day | ORAL | Status: DC
  Administered 2023-11-10: 20 mg via ORAL
  Filled 2023-11-09: qty 2

## 2023-11-09 MED ORDER — DOPAMINE-DEXTROSE 3.2-5 MG/ML-% IV SOLN
INTRAVENOUS | Status: AC
  Filled 2023-11-09: qty 250

## 2023-11-09 MED ORDER — DEXAMETHASONE SODIUM PHOSPHATE 10 MG/ML IJ SOLN
INTRAMUSCULAR | Status: DC | PRN
  Administered 2023-11-09: 10 mg via INTRAVENOUS

## 2023-11-09 MED ORDER — HYDRALAZINE HCL 20 MG/ML IJ SOLN: 5.0000 mg | INTRAMUSCULAR | Status: DC | PRN

## 2023-11-09 MED ORDER — ORAL CARE MOUTH RINSE: 15.0000 mL | OROMUCOSAL | Status: DC | PRN

## 2023-11-09 MED ORDER — DEXMEDETOMIDINE HCL IN NACL 80 MCG/20ML IV SOLN
INTRAVENOUS | Status: DC | PRN
  Administered 2023-11-09: 4 ug via INTRAVENOUS

## 2023-11-09 MED ORDER — PHENYLEPHRINE HCL-NACL 20-0.9 MG/250ML-% IV SOLN
INTRAVENOUS | Status: DC | PRN
  Administered 2023-11-09 (×2): 160 ug via INTRAVENOUS
  Administered 2023-11-09 (×2): 80 ug via INTRAVENOUS
  Administered 2023-11-09: 40 ug/min via INTRAVENOUS
  Administered 2023-11-09: 80 ug via INTRAVENOUS

## 2023-11-09 MED ORDER — LIDOCAINE HCL (PF) 1 % IJ SOLN
INTRAMUSCULAR | Status: AC
  Filled 2023-11-09: qty 30

## 2023-11-09 MED ORDER — HYDROMORPHONE HCL 1 MG/ML IJ SOLN: 1.0000 mg | Freq: Once | INTRAMUSCULAR | Status: DC | PRN

## 2023-11-09 MED ORDER — IPRATROPIUM-ALBUTEROL 0.5-2.5 (3) MG/3ML IN SOLN
RESPIRATORY_TRACT | Status: AC
  Filled 2023-11-09: qty 3

## 2023-11-09 MED ORDER — FLUTICASONE FUROATE-VILANTEROL 100-25 MCG/ACT IN AEPB
1.0000 | INHALATION_SPRAY | Freq: Every day | RESPIRATORY_TRACT | Status: DC
  Filled 2023-11-09: qty 28

## 2023-11-09 MED ORDER — UMECLIDINIUM BROMIDE 62.5 MCG/ACT IN AEPB
1.0000 | INHALATION_SPRAY | Freq: Every day | RESPIRATORY_TRACT | Status: DC
  Filled 2023-11-09: qty 7

## 2023-11-09 MED ORDER — DOCUSATE SODIUM 100 MG PO CAPS: 100.0000 mg | ORAL_CAPSULE | Freq: Every day | ORAL | Status: DC

## 2023-11-09 MED ORDER — DOPAMINE-DEXTROSE 3.2-5 MG/ML-% IV SOLN
6.0000 ug/kg/min | INTRAVENOUS | Status: DC
  Administered 2023-11-09: 6 ug/kg/min via INTRAVENOUS

## 2023-11-09 MED ORDER — SODIUM CHLORIDE 0.9 % IV SOLN
500.0000 mL | Freq: Once | INTRAVENOUS | Status: AC | PRN
  Administered 2023-11-09: 500 mL via INTRAVENOUS

## 2023-11-09 MED ORDER — SODIUM CHLORIDE 0.9 % IV SOLN
INTRAVENOUS | Status: DC | PRN
  Administered 2023-11-09: 501 mL

## 2023-11-09 MED ORDER — METOPROLOL TARTRATE 5 MG/5ML IV SOLN
2.0000 mg | INTRAVENOUS | Status: DC | PRN
Start: 2023-11-09 — End: 2023-11-10

## 2023-11-09 MED ORDER — MIDAZOLAM HCL 2 MG/2ML IJ SOLN
INTRAMUSCULAR | Status: AC
  Filled 2023-11-09: qty 2

## 2023-11-09 MED ORDER — BISOPROLOL FUMARATE 5 MG PO TABS
10.0000 mg | ORAL_TABLET | Freq: Every day | ORAL | Status: DC
  Filled 2023-11-09: qty 2

## 2023-11-09 MED ORDER — DOPAMINE-DEXTROSE 3.2-5 MG/ML-% IV SOLN
6.0000 ug/kg/min | INTRAVENOUS | Status: DC
  Administered 2023-11-09: 4 ug/kg/min via INTRAVENOUS
  Administered 2023-11-09: 6 ug/kg/min via INTRAVENOUS
  Administered 2023-11-09: 4 ug/kg/min via INTRAVENOUS
  Administered 2023-11-09: 6 ug/kg/min via INTRAVENOUS

## 2023-11-09 MED ORDER — CEFAZOLIN SODIUM-DEXTROSE 2-4 GM/100ML-% IV SOLN
2.0000 g | Freq: Three times a day (TID) | INTRAVENOUS | Status: AC
  Administered 2023-11-09 – 2023-11-10 (×2): 2 g via INTRAVENOUS
  Filled 2023-11-09 (×2): qty 100

## 2023-11-09 MED ORDER — PHENYLEPHRINE 80 MCG/ML (10ML) SYRINGE FOR IV PUSH (FOR BLOOD PRESSURE SUPPORT)
PREFILLED_SYRINGE | INTRAVENOUS | Status: AC
  Filled 2023-11-09: qty 10

## 2023-11-09 MED ORDER — LISINOPRIL-HYDROCHLOROTHIAZIDE 20-12.5 MG PO TABS: 0.5000 | ORAL_TABLET | Freq: Every day | ORAL | Status: DC

## 2023-11-09 MED ORDER — GUAIFENESIN-DM 100-10 MG/5ML PO SYRP: 15.0000 mL | ORAL_SOLUTION | ORAL | Status: DC | PRN

## 2023-11-09 MED ORDER — PHENOL 1.4 % MT LIQD: 1.0000 | OROMUCOSAL | Status: DC | PRN

## 2023-11-09 MED ORDER — FENTANYL CITRATE (PF) 100 MCG/2ML IJ SOLN
INTRAMUSCULAR | Status: DC | PRN
  Administered 2023-11-09 (×2): 50 ug via INTRAVENOUS

## 2023-11-09 MED ORDER — PHENYLEPHRINE HCL-NACL 20-0.9 MG/250ML-% IV SOLN
INTRAVENOUS | Status: AC
  Filled 2023-11-09: qty 250

## 2023-11-09 MED ORDER — HEMOSTATIC AGENTS (NO CHARGE) OPTIME
TOPICAL | Status: DC | PRN
  Administered 2023-11-09: 1 via TOPICAL

## 2023-11-09 MED ORDER — MORPHINE SULFATE (PF) 2 MG/ML IV SOLN
2.0000 mg | INTRAVENOUS | Status: DC | PRN
  Administered 2023-11-10: 2 mg via INTRAVENOUS
  Filled 2023-11-09: qty 1

## 2023-11-09 MED ORDER — LISINOPRIL 20 MG PO TABS: 20.0000 mg | ORAL_TABLET | Freq: Every day | ORAL | Status: DC

## 2023-11-09 SURGICAL SUPPLY — 48 items
BAG DECANTER FOR FLEXI CONT (MISCELLANEOUS) ×1 IMPLANT
BLADE SURG 15 STRL LF DISP TIS (BLADE) ×1 IMPLANT
BLADE SURG SZ11 CARB STEEL (BLADE) ×1 IMPLANT
BRUSH SCRUB EZ 4% CHG (MISCELLANEOUS) ×1 IMPLANT
CLAMP SUTURE YELLOW 5 PAIRS (MISCELLANEOUS) ×2 IMPLANT
DERMABOND ADVANCED .7 DNX12 (GAUZE/BANDAGES/DRESSINGS) ×1 IMPLANT
DRAPE 3/4 80X56 (DRAPES) IMPLANT
DRAPE INCISE IOBAN 66X45 STRL (DRAPES) ×1 IMPLANT
DRAPE LAPAROTOMY 100X77 ABD (DRAPES) ×1 IMPLANT
DRAPE MAG INST 16X20 L/F (DRAPES) IMPLANT
DRESSING SURGICEL FIBRLLR 1X2 (HEMOSTASIS) ×1 IMPLANT
DRSG SURGICEL FIBRILLAR 1X2 (HEMOSTASIS) ×1
ELECT CAUTERY BLADE 6.4 (BLADE) ×1 IMPLANT
ELECT REM PT RETURN 9FT ADLT (ELECTROSURGICAL) ×1
ELECTRODE REM PT RTRN 9FT ADLT (ELECTROSURGICAL) ×1 IMPLANT
GLOVE BIO SURGEON STRL SZ7 (GLOVE) ×1 IMPLANT
GLOVE SURG SYN 8.0 (GLOVE) ×1 IMPLANT
GLOVE SURG SYN 8.0 PF PI (GLOVE) ×1 IMPLANT
GOWN STRL REUS W/ TWL XL LVL3 (GOWN DISPOSABLE) ×2 IMPLANT
IV NS 500ML BAXH (IV SOLUTION) ×1 IMPLANT
KIT TURNOVER KIT A (KITS) ×1 IMPLANT
LABEL OR SOLS (LABEL) ×1 IMPLANT
LOOP VESSEL MAXI 1X406 RED (MISCELLANEOUS) ×2 IMPLANT
LOOP VESSEL MINI 0.8X406 BLUE (MISCELLANEOUS) ×1 IMPLANT
MAGNETIC INSTRUMENT PAD 16" X 20" IMPLANT
MANIFOLD NEPTUNE II (INSTRUMENTS) ×1 IMPLANT
NDL FILTER BLUNT 18X1 1/2 (NEEDLE) ×1 IMPLANT
NDL HYPO 25X1 1.5 SAFETY (NEEDLE) ×1 IMPLANT
NEEDLE FILTER BLUNT 18X1 1/2 (NEEDLE) ×1 IMPLANT
NEEDLE HYPO 25X1 1.5 SAFETY (NEEDLE) ×1 IMPLANT
PACK BASIN MAJOR ARMC (MISCELLANEOUS) ×1 IMPLANT
PATCH VASC XENOSURE .8CM X 8CM (Vascular Products) IMPLANT
SET WALTER ACTIVATION W/DRAPE (SET/KITS/TRAYS/PACK) IMPLANT
SHUNT CAROTID STR REINF 3.0X4. (MISCELLANEOUS) ×1 IMPLANT
SUT MNCRL+ 5-0 UNDYED PC-3 (SUTURE) ×1 IMPLANT
SUT PROLENE 6 0 BV (SUTURE) ×8 IMPLANT
SUT PROLENE 7 0 BV 1 (SUTURE) ×6 IMPLANT
SUT SILK 2-0 18XBRD TIE 12 (SUTURE) ×1 IMPLANT
SUT SILK 3-0 18XBRD TIE 12 (SUTURE) ×1 IMPLANT
SUT SILK 4-0 18XBRD TIE 12 (SUTURE) ×1 IMPLANT
SUT VIC AB 3-0 SH 27X BRD (SUTURE) ×1 IMPLANT
SYR 10ML LL (SYRINGE) ×1 IMPLANT
SYR 20ML LL LF (SYRINGE) ×1 IMPLANT
TAG SUTURE CLAMP YLW 5PR (MISCELLANEOUS) ×2
TRAP FLUID SMOKE EVACUATOR (MISCELLANEOUS) ×1 IMPLANT
TRAY FOLEY MTR SLVR 16FR STAT (SET/KITS/TRAYS/PACK) ×1 IMPLANT
VASC PATCH XENOSURE .8CM X 8CM (Vascular Products) ×1 IMPLANT
WATER STERILE IRR 500ML POUR (IV SOLUTION) ×1 IMPLANT

## 2023-11-09 NOTE — H&P (View-Only) (Signed)
MRN : [id number]  Laura Nielsen is a 67 y.o. ([birth year]) female who presents with chief complaint of check carotid arteries.  History of Present Illness:   Patient presents to [facility] today for treatment of her critical carotid stenosis.  CT scan was done 08/17/2023.  Patient reports that the test went well with no problems or complications.    The patient denies interval amaurosis fugax. There is no recent or interval TIA symptoms or focal motor deficits. There is no prior documented CVA.   The patient is taking enteric-coated aspirin 81 mg daily.   There is no history of migraine headaches. There is no history of seizures.   No recent shortening of the patient's walking distance or new symptoms consistent with claudication.  No history of rest pain symptoms. No new ulcers or wounds of the lower extremities have occurred.   There is no history of DVT, PE or superficial thrombophlebitis. No recent episodes of angina or shortness of breath documented.    CT angiogram dated 08/17/2023 is reviewed by me personally and shows 80% stenosis consistent with densely calcified plaque at the origin of the right internal carotid artery.  It is a densely calcified lesion without evidence for soft plaque or ulceration.   Previous noninvasive studies: Duplex ultrasound shows a patent stent graft no endoleak and the sac is 2.81 cm; previous study measured 2.8 cm.   Carotid duplex shows RICA 80-99% and LICA 1-39%  stenosis (increase on the right)   ABI Rt=1.08 and Lt=1.09 triphasic signals bilaterally.  (Previous ABI Rt=1.11 and Lt=1.12 triphasic signals bilaterally)  Current Meds  Medication Sig   acetaminophen (TYLENOL) 500 MG tablet Take 500 mg by mouth every 6 (six) hours as needed for moderate pain (pain score 4-6).   allopurinol (ZYLOPRIM) 100 MG tablet Take 1 tablet (100 mg total) by mouth daily.   aspirin 81 MG tablet Take 81 mg by mouth  daily.    bisoprolol (ZEBETA) 10 MG tablet Take 1 tablet (10 mg total) by mouth daily.   Budeson-Glycopyrrol-Formoterol (BREZTRI AEROSPHERE) 160-9-4.8 MCG/ACT AERO Inhale 2 puffs into the lungs in the morning and at bedtime.   clopidogrel (PLAVIX) 75 MG tablet Take 1 tablet (75 mg total) by mouth daily.   ibuprofen (ADVIL) 200 MG tablet Take 200 mg by mouth 2 (two) times daily as needed (headaches).   lidocaine-prilocaine (EMLA) cream Apply on the port. 30 -45 min  prior to port access.   lisinopril-hydrochlorothiazide (ZESTORETIC) 20-12.5 MG tablet Take 1 tablet by mouth daily. (Patient taking differently: Take 0.5 tablets by mouth daily.)   magnesium gluconate (MAGONATE) 500 MG tablet Take 500 mg by mouth at bedtime.   pantoprazole (PROTONIX) 20 MG tablet Take 1 tablet (20 mg total) by mouth daily.   Polyethyl Glyc-Propyl Glyc PF (SYSTANE PRESERVATIVE FREE) 0.4-0.3 % SOLN Place 1 drop into both eyes daily as needed (dry eyes).   PROAIR HFA 108 (90 Base) MCG/ACT inhaler Inhale 2 puffs into the lungs every 6 (six) hours as needed for wheezing or shortness of breath.   rosuvastatin (CRESTOR) 20 MG tablet Take 1 tablet (20 mg total) by mouth daily.   Semaglutide, 1 MG/DOSE, (OZEMPIC, 1 MG/DOSE,) 4 MG/3ML SOPN Inject 1 mg into the skin once a week.    Past Medical History:  Diagnosis Date   AAA (abdominal aortic aneurysm) (  HCC)    a.) s/p EVAR stent graft repair 03/27/2013   Adnexal mass    Aortic atherosclerosis (HCC)    Arthritis    Asthma    Atherosclerosis of native artery of both lower extremities with intermittent claudication (HCC)    B12 deficiency    Bilateral carotid artery stenosis    a.) doppler 05/06/2020 and 06/30/2021: 1-39% BICA; b.) doppler 11/20/2022: 60-79% RICA; c.) CTA neck 08/17/2023: >75% RICA, 30% LICA   CAD (coronary artery disease) 03/16/2009   a.) LHC/PCI 03/16/2009: 25% mLCx, 70% mRCA (3 x 18 mm Xience V DES); b.) MV 10/22/2017 and 03/16/2021: no ischemia; c.) MV  07/16/2023: mild apical ischemia   Cancer of left adrenal gland (HCC) 06/20/2022   a.) CNB 06/20/2022 -> pathology (+) for CK7 (TTF-1 -) adenocarcinoma   Centrilobular emphysema (HCC)    Cervical spinal stenosis    Cervical spondylosis with radiculopathy    Chronic back pain    Colon polyp    Complex ovarian cyst    Complication of anesthesia    a.) prone to experience postoperative hypotension   COPD (chronic obstructive pulmonary disease) (HCC)    DDD (degenerative disc disease), cervical    DDD (degenerative disc disease), thoracolumbar    Depression    Diabetes mellitus type 2, controlled (HCC)    Diastolic dysfunction 06/29/2023   a.) TTE 06/29/2023: EF >55%, no RWMAs, G1DD, triv PR, mild MR/TR   Fatty liver    GERD (gastroesophageal reflux disease)    Gout    History of bilateral cataract extraction    History of kidney stones    Hypercholesteremia    Long term (current) use of aspirin    Long term current use of clopidogrel    Lumbar spinal stenosis    Lumbar spondylosis    Non-small cell lung cancer metastatic to adrenal gland (HCC)    a.) s/p LLL wedge resection 04/06/2021 --> pathology (+) for well differentiated adenocarcinoma (stage 1A) --> no adjuvant Tx; b.) recurrent stage IV adenocarcinoma 05/2022 --> Tx'd with  pembrolizumab   Osteoarthritis    Osteoporosis    Primary hypertension    Seasonal allergies    Sleep apnea    a.) no nocturnal PAP therapy since 2021   Vitamin D deficiency     Past Surgical History:  Procedure Laterality Date   ABDOMINAL AORTIC ENDOVASCULAR STENT GRAFT  03/27/2013   Dr. Levora Dredge   ABDOMINAL HYSTERECTOMY     Menometrorrhagia. Excessive bleeding. Unknown if cervix removed.    BILATERAL SALPINGOOPHORECTOMY  09/27/2023   CATARACT EXTRACTION W/PHACO Left 05/14/2023   Procedure: CATARACT EXTRACTION PHACO AND INTRAOCULAR LENS PLACEMENT (IOC) LEFT DIABETIC  OMIDRIA  19.40  01:34.8;  Surgeon: Estanislado Pandy, MD;   Location: [facility];  Service: Ophthalmology;  Laterality: Left;   CATARACT EXTRACTION W/PHACO Right 06/27/2023   Procedure: CATARACT EXTRACTION PHACO AND INTRAOCULAR LENS PLACEMENT (IOC) RIGHT DIABETIC  6.47  00:42.6;  Surgeon: Estanislado Pandy, MD;  Location: [facility];  Service: Ophthalmology;  Laterality: Right;   COLONOSCOPY WITH PROPOFOL N/A 01/05/2021   Procedure: COLONOSCOPY WITH PROPOFOL;  Surgeon: Pasty Spillers, MD;  Location: [facility];  Service: Endoscopy;  Laterality: N/A;   CORONARY ANGIOPLASTY WITH STENT PLACEMENT  03/16/2009   Procedure: CORONARY ANGIOPLASTY WITH STENT PLACEMENT; Location: [facility]; Surgeons: Harold Hedge, MD (diagnostic) and Lorine Bears, MD (interventional)   ESOPHAGOGASTRODUODENOSCOPY (EGD) WITH PROPOFOL N/A 01/05/2021   Procedure: ESOPHAGOGASTRODUODENOSCOPY (EGD) WITH PROPOFOL;  Surgeon: Pasty Spillers,  MD;  Location: [facility];  Service: Endoscopy;  Laterality: N/A;   EXTRACORPOREAL SHOCK WAVE LITHOTRIPSY Right 1999   INTERCOSTAL NERVE BLOCK Left 04/06/2021   Procedure: INTERCOSTAL NERVE BLOCK;  Surgeon: Loreli Slot, MD;  Location[facility];  Service: Thoracic;  Laterality: Left;   IR IMAGING GUIDED PORT INSERTION  06/20/2022   LUNG LOBECTOMY Left 04/06/2021   robotic left lower lobectomy 04/06/2021 Dr. Dorris Fetch for Stage 1A adenocarcinoma   NODE DISSECTION Left 04/06/2021   Procedure: NODE DISSECTION;  Surgeon: Loreli Slot, MD;  Location: [facility] OR;  Service: Thoracic;  Laterality: Left;   TUBAL LIGATION      Social History Social History   Tobacco Use   Smoking status: Every Day    Current packs/day: 1.00    Average packs/day: 0.7 packs/day for 96.4 years (63.1 ttl pk-yrs)    Types: Cigarettes    Start date: 1973   Smokeless tobacco: Never   Tobacco comments:    12/23/19 states she quit for 9 months and then started back.   Vaping Use   Vaping status: Never Used  Substance Use Topics    Alcohol use: Not Currently   Drug use: No    Family History Family History  Problem Relation Age of Onset   Hypertension Mother    Coronary artery disease Mother    Heart attack Mother        acute   Cancer Mother    Alcohol abuse Father    Depression Father    Hypertension Father    Heart attack Father 32       acute   Alcohol abuse Sister    Hyperlipidemia Sister    Hypertension Sister    Cancer Sister 66   Heart attack Sister        x's 2   Coronary artery disease Sister 49       x's 2   Breast cancer Sister 8    Allergies  Allergen Reactions   Atorvastatin Other (See Comments)    Elevated blood sugar    Omeprazole Nausea And Vomiting   Bupropion Nausea Only    Only on the 150mg  tablets, but the 100mg  didn't help with smoking     REVIEW OF SYSTEMS (Negative unless checked)  Constitutional: [] Weight loss  [] Fever  [] Chills Cardiac: [] Chest pain   [] Chest pressure   [] Palpitations   [] Shortness of breath when laying flat   [] Shortness of breath with exertion. Vascular:  [x] Pain in legs with walking   [] Pain in legs at rest  [] History of DVT   [] Phlebitis   [] Swelling in legs   [] Varicose veins   [] Non-healing ulcers Pulmonary:   [] Uses home oxygen   [] Productive cough   [] Hemoptysis   [] Wheeze  [] COPD   [] Asthma Neurologic:  [] Dizziness   [] Seizures   [] History of stroke   [] History of TIA  [] Aphasia   [] Vissual changes   [] Weakness or numbness in arm   [] Weakness or numbness in leg Musculoskeletal:   [] Joint swelling   [] Joint pain   [] Low back pain Hematologic:  [] Easy bruising  [] Easy bleeding   [] Hypercoagulable state   [] Anemic Gastrointestinal:  [] Diarrhea   [] Vomiting  [] Gastroesophageal reflux/heartburn   [] Difficulty swallowing. Genitourinary:  [] Chronic kidney disease   [] Difficult urination  [] Frequent urination   [] Blood in urine Skin:  [] Rashes   [] Ulcers  Psychological:  [] History of anxiety   []  History of major depression.  Physical  Examination  Vitals:   11/09/23 1610  BP: 119/81  Pulse: 74  Resp: 18  Temp: (!) 97.2 F (36.2 C)  TempSrc: Temporal  SpO2: 98%  Weight: 88.9 kg  Height: 5\' 8"  (1.727 m)   Body mass index is 29.8 kg/m. Gen: WD/WN, NAD Head: [place]/AT, No temporalis wasting.  Ear/Nose/Throat: Hearing grossly intact, nares w/o erythema or drainage Eyes: PER, EOMI, sclera nonicteric.  Neck: Supple, no masses.  No bruit or JVD.  Pulmonary:  Good air movement, no audible wheezing, no use of accessory muscles.  Cardiac: RRR, normal S1, S2, no Murmurs. Vascular:  carotid bruit noted Vessel Right Left  Radial Palpable Palpable  Carotid  Palpable  Palpable  Gastrointestinal: soft, non-distended. No guarding/no peritoneal signs.  Musculoskeletal: M/S 5/5 throughout.  No visible deformity.  Neurologic: CN 2-12 intact. Pain and light touch intact in extremities.  Symmetrical.  Speech is fluent. Motor exam as listed above. Psychiatric: Judgment intact, Mood & affect appropriate for pt's clinical situation. Dermatologic: No rashes or ulcers noted.  No changes consistent with cellulitis.   CBC Lab Results  Component Value Date   WBC 10.0 10/22/2023   HGB 15.4 (H) 10/22/2023   HCT 47.2 (H) 10/22/2023   MCV 87.1 10/22/2023   PLT 259 10/22/2023    BMET    Component Value Date/Time   NA 140 10/22/2023 0923   NA 142 02/13/2022 1027   NA 139 03/28/2013 0354   K 4.1 10/22/2023 0923   K 3.7 03/28/2013 1811   CL 105 10/22/2023 0923   CL 107 03/28/2013 0354   CO2 24 10/22/2023 0923   CO2 27 03/28/2013 0354   GLUCOSE 97 10/22/2023 0923   GLUCOSE 112 (H) 03/28/2013 0354   BUN 17 10/22/2023 0923   BUN 15 02/13/2022 1027   BUN 8 03/28/2013 0354   CREATININE 0.68 10/22/2023 0923   CREATININE 0.64 03/05/2023 0947   CREATININE 0.60 08/30/2017 0920   CALCIUM 9.6 10/22/2023 0923   CALCIUM 8.2 (L) 03/28/2013 0354   GFRNONAA >60 10/22/2023 0923   GFRNONAA >60 03/05/2023 0947   GFRNONAA 99 08/30/2017 0920    GFRAA 93 01/25/2021 1048   GFRAA 115 08/30/2017 0920   Estimated Creatinine Clearance: 80.7 mL/min (by C-G formula based on SCr of 0.68 mg/dL).  COAG Lab Results  Component Value Date   INR 1.0 06/20/2022   INR 1.0 07/12/2021   INR 1.0 04/04/2021    Radiology No results found.   Assessment/Plan 1. Bilateral carotid artery stenosis Recommend:   The patient remains asymptomatic with respect to the carotid stenosis.  However, the patient has now progressed and has a lesion the is >80%.   Patient's CT angiography of the carotid arteries confirms >80% right ICA stenosis.  The anatomical considerations support surgery over stenting.  This was discussed in detail with the patient.   The patient does indeed need surgery, therefore, cardiac clearance will be arranged. Once cleared the patient will be scheduled for surgery.   I have completed Shared Decision-Making with Tyshika Guidry to carotid surgery/intervention. The conversation included: -Discussion of all treatment options including carotid endarterectomy (CEA), carotid artery stenting (which includes transcarotid artery revascularization (TCAR)), and optimal medical therapy (OMT). -Explanation of risks and benefits for each option specific to [employer] clinical situation. -Integration of clinical guidelines as it relates to the patient's history and comorbidities. -Discussion and incorporation of Nicolia Zirkle and their personal preferences and priorities in choosing a treatment plan plan   If the patient was unable to participate in Shared Decision Making  this process was done with te patient.   The patient's NIHSS score is as follows: 0 Mild: 1 - 5 Mild to Moderately Severe: 5 - 14 Severe: 15 - 24 Very Severe: >25   The risks, benefits and alternative therapies were reviewed in detail with the patient.  All questions were answered.  The patient agrees to proceed with surgery of the right carotid artery.  I would  proceed with oncologic surgery at this point given the characteristics of this lesion as well as the rather mild disease on the left side I believe the risks are more favorable than moving forward emergently with carotid surgery and then necessitating dual antiplatelet therapy and delaying for several months the ovarian surgery.   Continue antiplatelet therapy as prescribed. Continue management of CAD, HTN and Hyperlipidemia. Healthy heart diet, encouraged exercise at least 4 times per week.    2. Infrarenal abdominal aortic aneurysm (AAA) without rupture (HCC) Recommend: No surgery or intervention is indicated at this time.   The patient has an asymptomatic abdominal aortic aneurysm that is less than 4 cm in maximal diameter.     I have reviewed the natural history of abdominal aortic aneurysm and the small risk of rupture for aneurysm less than 5 cm in size.   However, as these small aneurysms tend to enlarge over time, continued surveillance with ultrasound or CT scan is mandatory.    I have also discussed optimizing medical management with hypertension and lipid control and the importance of abstinence from tobacco.  The patient is also encouraged to exercise a minimum of 30 minutes 4 times a week.    Should the patient develop new onset abdominal or back pain or signs of peripheral embolization they are instructed to seek medical attention immediately and to alert the physician providing care that they have an aneurysm.    The patient voices their understanding.   The patient will return in 12 months with an aortic duplex.     3. Atherosclerosis of native artery of both lower extremities with intermittent claudication (HCC) Recommend:   The patient has evidence of atherosclerosis of the lower extremities with claudication.  The patient does not voice lifestyle limiting changes at this point in time.   Noninvasive studies do not suggest clinically significant change.   No invasive  studies, angiography or surgery at this time The patient should continue walking and begin a more formal exercise program.  The patient should continue antiplatelet therapy and aggressive treatment of the lipid abnormalities   No changes in the patient's medications at this time   Continued surveillance is indicated as atherosclerosis is likely to progress with time.     The patient will continue follow up with noninvasive studies as ordered.    4. CAD in native artery Continue cardiac and antihypertensive medications as already ordered and reviewed, no changes at this time.   Continue statin as ordered and reviewed, no changes at this time   Nitrates PRN for chest pain   5. Primary hypertension Continue antihypertensive medications as already ordered, these medications have been reviewed and there are no changes at this time.   Levora Dredge, MD  11/09/2023 7:18 AM

## 2023-11-09 NOTE — Plan of Care (Signed)
  Problem: Education: Goal: Knowledge of General Education information will improve Description: Including pain rating scale, medication(s)/side effects and non-pharmacologic comfort measures Outcome: Progressing   Problem: Health Behavior/Discharge Planning: Goal: Ability to manage health-related needs will improve Outcome: Progressing   Problem: Clinical Measurements: Goal: Ability to maintain clinical measurements within normal limits will improve Outcome: Progressing Goal: Will remain free from infection Outcome: Progressing Goal: Diagnostic test results will improve Outcome: Progressing Goal: Respiratory complications will improve Outcome: Progressing Goal: Cardiovascular complication will be avoided Outcome: Progressing   Problem: Activity: Goal: Risk for activity intolerance will decrease Outcome: Progressing   Problem: Nutrition: Goal: Adequate nutrition will be maintained Outcome: Progressing   Problem: Coping: Goal: Level of anxiety will decrease Outcome: Progressing   Problem: Elimination: Goal: Will not experience complications related to bowel motility Outcome: Progressing Goal: Will not experience complications related to urinary retention Outcome: Progressing   Problem: Pain Management: Goal: General experience of comfort will improve Outcome: Progressing   Problem: Safety: Goal: Ability to remain free from injury will improve Outcome: Progressing   Problem: Skin Integrity: Goal: Risk for impaired skin integrity will decrease Outcome: Progressing   Problem: Education: Goal: Knowledge of discharge needs will improve Outcome: Progressing   Problem: Clinical Measurements: Goal: Postoperative complications will be avoided or minimized Outcome: Progressing   Problem: Respiratory: Goal: Ability to achieve and maintain a regular respiratory rate will improve Outcome: Progressing   Problem: Skin Integrity: Goal: Demonstration of wound healing  without infection will improve Outcome: Progressing   Problem: Education: Goal: Ability to describe self-care measures that may prevent or decrease complications (Diabetes Survival Skills Education) will improve Outcome: Progressing Goal: Individualized Educational Video(s) Outcome: Progressing   Problem: Coping: Goal: Ability to adjust to condition or change in health will improve Outcome: Progressing   Problem: Fluid Volume: Goal: Ability to maintain a balanced intake and output will improve Outcome: Progressing   Problem: Health Behavior/Discharge Planning: Goal: Ability to identify and utilize available resources and services will improve Outcome: Progressing Goal: Ability to manage health-related needs will improve Outcome: Progressing   Problem: Metabolic: Goal: Ability to maintain appropriate glucose levels will improve Outcome: Progressing   Problem: Nutritional: Goal: Maintenance of adequate nutrition will improve Outcome: Progressing Goal: Progress toward achieving an optimal weight will improve Outcome: Progressing   Problem: Skin Integrity: Goal: Risk for impaired skin integrity will decrease Outcome: Progressing   Problem: Tissue Perfusion: Goal: Adequacy of tissue perfusion will improve Outcome: Progressing

## 2023-11-09 NOTE — Progress Notes (Signed)
MRN : [id number]  Laura Nielsen is a 67 y.o. ([birth year]) female who presents with chief complaint of check carotid arteries.  History of Present Illness:   Patient presents to [facility] today for treatment of her critical carotid stenosis.  CT scan was done 08/17/2023.  Patient reports that the test went well with no problems or complications.    The patient denies interval amaurosis fugax. There is no recent or interval TIA symptoms or focal motor deficits. There is no prior documented CVA.   The patient is taking enteric-coated aspirin 81 mg daily.   There is no history of migraine headaches. There is no history of seizures.   No recent shortening of the patient's walking distance or new symptoms consistent with claudication.  No history of rest pain symptoms. No new ulcers or wounds of the lower extremities have occurred.   There is no history of DVT, PE or superficial thrombophlebitis. No recent episodes of angina or shortness of breath documented.    CT angiogram dated 08/17/2023 is reviewed by me personally and shows 80% stenosis consistent with densely calcified plaque at the origin of the right internal carotid artery.  It is a densely calcified lesion without evidence for soft plaque or ulceration.   Previous noninvasive studies: Duplex ultrasound shows a patent stent graft no endoleak and the sac is 2.81 cm; previous study measured 2.8 cm.   Carotid duplex shows RICA 80-99% and LICA 1-39%  stenosis (increase on the right)   ABI Rt=1.08 and Lt=1.09 triphasic signals bilaterally.  (Previous ABI Rt=1.11 and Lt=1.12 triphasic signals bilaterally)  Current Meds  Medication Sig   acetaminophen (TYLENOL) 500 MG tablet Take 500 mg by mouth every 6 (six) hours as needed for moderate pain (pain score 4-6).   allopurinol (ZYLOPRIM) 100 MG tablet Take 1 tablet (100 mg total) by mouth daily.   aspirin 81 MG tablet Take 81 mg by mouth  daily.    bisoprolol (ZEBETA) 10 MG tablet Take 1 tablet (10 mg total) by mouth daily.   Budeson-Glycopyrrol-Formoterol (BREZTRI AEROSPHERE) 160-9-4.8 MCG/ACT AERO Inhale 2 puffs into the lungs in the morning and at bedtime.   clopidogrel (PLAVIX) 75 MG tablet Take 1 tablet (75 mg total) by mouth daily.   ibuprofen (ADVIL) 200 MG tablet Take 200 mg by mouth 2 (two) times daily as needed (headaches).   lidocaine-prilocaine (EMLA) cream Apply on the port. 30 -45 min  prior to port access.   lisinopril-hydrochlorothiazide (ZESTORETIC) 20-12.5 MG tablet Take 1 tablet by mouth daily. (Patient taking differently: Take 0.5 tablets by mouth daily.)   magnesium gluconate (MAGONATE) 500 MG tablet Take 500 mg by mouth at bedtime.   pantoprazole (PROTONIX) 20 MG tablet Take 1 tablet (20 mg total) by mouth daily.   Polyethyl Glyc-Propyl Glyc PF (SYSTANE PRESERVATIVE FREE) 0.4-0.3 % SOLN Place 1 drop into both eyes daily as needed (dry eyes).   PROAIR HFA 108 (90 Base) MCG/ACT inhaler Inhale 2 puffs into the lungs every 6 (six) hours as needed for wheezing or shortness of breath.   rosuvastatin (CRESTOR) 20 MG tablet Take 1 tablet (20 mg total) by mouth daily.   Semaglutide, 1 MG/DOSE, (OZEMPIC, 1 MG/DOSE,) 4 MG/3ML SOPN Inject 1 mg into the skin once a week.    Past Medical History:  Diagnosis Date   AAA (abdominal aortic aneurysm) (  HCC)    a.) s/p EVAR stent graft repair 03/27/2013   Adnexal mass    Aortic atherosclerosis (HCC)    Arthritis    Asthma    Atherosclerosis of native artery of both lower extremities with intermittent claudication (HCC)    B12 deficiency    Bilateral carotid artery stenosis    a.) doppler 05/06/2020 and 06/30/2021: 1-39% BICA; b.) doppler 11/20/2022: 60-79% RICA; c.) CTA neck 08/17/2023: >75% RICA, 30% LICA   CAD (coronary artery disease) 03/16/2009   a.) LHC/PCI 03/16/2009: 25% mLCx, 70% mRCA (3 x 18 mm Xience V DES); b.) MV 10/22/2017 and 03/16/2021: no ischemia; c.) MV  07/16/2023: mild apical ischemia   Cancer of left adrenal gland (HCC) 06/20/2022   a.) CNB 06/20/2022 -> pathology (+) for CK7 (TTF-1 -) adenocarcinoma   Centrilobular emphysema (HCC)    Cervical spinal stenosis    Cervical spondylosis with radiculopathy    Chronic back pain    Colon polyp    Complex ovarian cyst    Complication of anesthesia    a.) prone to experience postoperative hypotension   COPD (chronic obstructive pulmonary disease) (HCC)    DDD (degenerative disc disease), cervical    DDD (degenerative disc disease), thoracolumbar    Depression    Diabetes mellitus type 2, controlled (HCC)    Diastolic dysfunction 06/29/2023   a.) TTE 06/29/2023: EF >55%, no RWMAs, G1DD, triv PR, mild MR/TR   Fatty liver    GERD (gastroesophageal reflux disease)    Gout    History of bilateral cataract extraction    History of kidney stones    Hypercholesteremia    Long term (current) use of aspirin    Long term current use of clopidogrel    Lumbar spinal stenosis    Lumbar spondylosis    Non-small cell lung cancer metastatic to adrenal gland (HCC)    a.) s/p LLL wedge resection 04/06/2021 --> pathology (+) for well differentiated adenocarcinoma (stage 1A) --> no adjuvant Tx; b.) recurrent stage IV adenocarcinoma 05/2022 --> Tx'd with  pembrolizumab   Osteoarthritis    Osteoporosis    Primary hypertension    Seasonal allergies    Sleep apnea    a.) no nocturnal PAP therapy since 2021   Vitamin D deficiency     Past Surgical History:  Procedure Laterality Date   ABDOMINAL AORTIC ENDOVASCULAR STENT GRAFT  03/27/2013   Dr. Levora Dredge   ABDOMINAL HYSTERECTOMY     Menometrorrhagia. Excessive bleeding. Unknown if cervix removed.    BILATERAL SALPINGOOPHORECTOMY  09/27/2023   CATARACT EXTRACTION W/PHACO Left 05/14/2023   Procedure: CATARACT EXTRACTION PHACO AND INTRAOCULAR LENS PLACEMENT (IOC) LEFT DIABETIC  OMIDRIA  19.40  01:34.8;  Surgeon: Estanislado Pandy, MD;   Location[facility];  Service: Ophthalmology;  Laterality: Left;   CATARACT EXTRACTION W/PHACO Right 06/27/2023   Procedure: CATARACT EXTRACTION PHACO AND INTRAOCULAR LENS PLACEMENT (IOC) RIGHT DIABETIC  6.47  00:42.6;  Surgeon: Estanislado Pandy, MD;  Location: [facility];  Service: Ophthalmology;  Laterality: Right;   COLONOSCOPY WITH PROPOFOL N/A 01/05/2021   Procedure: COLONOSCOPY WITH PROPOFOL;  Surgeon: Pasty Spillers, MD;  Location: [facility];  Service: Endoscopy;  Laterality: N/A;   CORONARY ANGIOPLASTY WITH STENT PLACEMENT  03/16/2009   Procedure: CORONARY ANGIOPLASTY WITH STENT PLACEMENT; Location: [facility]; Surgeons: Harold Hedge, MD (diagnostic) and Lorine Bears, MD (interventional)   ESOPHAGOGASTRODUODENOSCOPY (EGD) WITH PROPOFOL N/A 01/05/2021   Procedure: ESOPHAGOGASTRODUODENOSCOPY (EGD) WITH PROPOFOL;  Surgeon: Pasty Spillers,  MD;  Location: [facility];  Service: Endoscopy;  Laterality: N/A;   EXTRACORPOREAL SHOCK WAVE LITHOTRIPSY Right 1999   INTERCOSTAL NERVE BLOCK Left 04/06/2021   Procedure: INTERCOSTAL NERVE BLOCK;  Surgeon: Loreli Slot, MD;  Location[facility];  Service: Thoracic;  Laterality: Left;   IR IMAGING GUIDED PORT INSERTION  06/20/2022   LUNG LOBECTOMY Left 04/06/2021   robotic left lower lobectomy 04/06/2021 Dr. Dorris Fetch for Stage 1A adenocarcinoma   NODE DISSECTION Left 04/06/2021   Procedure: NODE DISSECTION;  Surgeon: Loreli Slot, MD;  Location: [facility] OR;  Service: Thoracic;  Laterality: Left;   TUBAL LIGATION      Social History Social History   Tobacco Use   Smoking status: Every Day    Current packs/day: 1.00    Average packs/day: 0.7 packs/day for 96.4 years (63.1 ttl pk-yrs)    Types: Cigarettes    Start date: 1973   Smokeless tobacco: Never   Tobacco comments:    12/23/19 states she quit for 9 months and then started back.   Vaping Use   Vaping status: Never Used  Substance Use Topics    Alcohol use: Not Currently   Drug use: No    Family History Family History  Problem Relation Age of Onset   Hypertension Mother    Coronary artery disease Mother    Heart attack Mother        acute   Cancer Mother    Alcohol abuse Father    Depression Father    Hypertension Father    Heart attack Father 32       acute   Alcohol abuse Sister    Hyperlipidemia Sister    Hypertension Sister    Cancer Sister 66   Heart attack Sister        x's 2   Coronary artery disease Sister 49       x's 2   Breast cancer Sister 8    Allergies  Allergen Reactions   Atorvastatin Other (See Comments)    Elevated blood sugar    Omeprazole Nausea And Vomiting   Bupropion Nausea Only    Only on the 150mg  tablets, but the 100mg  didn't help with smoking     REVIEW OF SYSTEMS (Negative unless checked)  Constitutional: [] Weight loss  [] Fever  [] Chills Cardiac: [] Chest pain   [] Chest pressure   [] Palpitations   [] Shortness of breath when laying flat   [] Shortness of breath with exertion. Vascular:  [x] Pain in legs with walking   [] Pain in legs at rest  [] History of DVT   [] Phlebitis   [] Swelling in legs   [] Varicose veins   [] Non-healing ulcers Pulmonary:   [] Uses home oxygen   [] Productive cough   [] Hemoptysis   [] Wheeze  [] COPD   [] Asthma Neurologic:  [] Dizziness   [] Seizures   [] History of stroke   [] History of TIA  [] Aphasia   [] Vissual changes   [] Weakness or numbness in arm   [] Weakness or numbness in leg Musculoskeletal:   [] Joint swelling   [] Joint pain   [] Low back pain Hematologic:  [] Easy bruising  [] Easy bleeding   [] Hypercoagulable state   [] Anemic Gastrointestinal:  [] Diarrhea   [] Vomiting  [] Gastroesophageal reflux/heartburn   [] Difficulty swallowing. Genitourinary:  [] Chronic kidney disease   [] Difficult urination  [] Frequent urination   [] Blood in urine Skin:  [] Rashes   [] Ulcers  Psychological:  [] History of anxiety   []  History of major depression.  Physical  Examination  Vitals:   11/09/23 1610  BP: 119/81  Pulse: 74  Resp: 18  Temp: (!) 97.2 F (36.2 C)  TempSrc: Temporal  SpO2: 98%  Weight: 88.9 kg  Height: 5\' 8"  (1.727 m)   Body mass index is 29.8 kg/m. Gen: WD/WN, NAD Head: [place]/AT, No temporalis wasting.  Ear/Nose/Throat: Hearing grossly intact, nares w/o erythema or drainage Eyes: PER, EOMI, sclera nonicteric.  Neck: Supple, no masses.  No bruit or JVD.  Pulmonary:  Good air movement, no audible wheezing, no use of accessory muscles.  Cardiac: RRR, normal S1, S2, no Murmurs. Vascular:  carotid bruit noted Vessel Right Left  Radial Palpable Palpable  Carotid  Palpable  Palpable  Gastrointestinal: soft, non-distended. No guarding/no peritoneal signs.  Musculoskeletal: M/S 5/5 throughout.  No visible deformity.  Neurologic: CN 2-12 intact. Pain and light touch intact in extremities.  Symmetrical.  Speech is fluent. Motor exam as listed above. Psychiatric: Judgment intact, Mood & affect appropriate for pt's clinical situation. Dermatologic: No rashes or ulcers noted.  No changes consistent with cellulitis.   CBC Lab Results  Component Value Date   WBC 10.0 10/22/2023   HGB 15.4 (H) 10/22/2023   HCT 47.2 (H) 10/22/2023   MCV 87.1 10/22/2023   PLT 259 10/22/2023    BMET    Component Value Date/Time   NA 140 10/22/2023 0923   NA 142 02/13/2022 1027   NA 139 03/28/2013 0354   K 4.1 10/22/2023 0923   K 3.7 03/28/2013 1811   CL 105 10/22/2023 0923   CL 107 03/28/2013 0354   CO2 24 10/22/2023 0923   CO2 27 03/28/2013 0354   GLUCOSE 97 10/22/2023 0923   GLUCOSE 112 (H) 03/28/2013 0354   BUN 17 10/22/2023 0923   BUN 15 02/13/2022 1027   BUN 8 03/28/2013 0354   CREATININE 0.68 10/22/2023 0923   CREATININE 0.64 03/05/2023 0947   CREATININE 0.60 08/30/2017 0920   CALCIUM 9.6 10/22/2023 0923   CALCIUM 8.2 (L) 03/28/2013 0354   GFRNONAA >60 10/22/2023 0923   GFRNONAA >60 03/05/2023 0947   GFRNONAA 99 08/30/2017 0920    GFRAA 93 01/25/2021 1048   GFRAA 115 08/30/2017 0920   Estimated Creatinine Clearance: 80.7 mL/min (by C-G formula based on SCr of 0.68 mg/dL).  COAG Lab Results  Component Value Date   INR 1.0 06/20/2022   INR 1.0 07/12/2021   INR 1.0 04/04/2021    Radiology No results found.   Assessment/Plan 1. Bilateral carotid artery stenosis Recommend:   The patient remains asymptomatic with respect to the carotid stenosis.  However, the patient has now progressed and has a lesion the is >80%.   Patient's CT angiography of the carotid arteries confirms >80% right ICA stenosis.  The anatomical considerations support surgery over stenting.  This was discussed in detail with the patient.   The patient does indeed need surgery, therefore, cardiac clearance will be arranged. Once cleared the patient will be scheduled for surgery.   I have completed Shared Decision-Making with Tyshika Guidry to carotid surgery/intervention. The conversation included: -Discussion of all treatment options including carotid endarterectomy (CEA), carotid artery stenting (which includes transcarotid artery revascularization (TCAR)), and optimal medical therapy (OMT). -Explanation of risks and benefits for each option specific to [employer] clinical situation. -Integration of clinical guidelines as it relates to the patient's history and comorbidities. -Discussion and incorporation of Nicolia Zirkle and their personal preferences and priorities in choosing a treatment plan plan   If the patient was unable to participate in Shared Decision Making  this process was done with te patient.   The patient's NIHSS score is as follows: 0 Mild: 1 - 5 Mild to Moderately Severe: 5 - 14 Severe: 15 - 24 Very Severe: >25   The risks, benefits and alternative therapies were reviewed in detail with the patient.  All questions were answered.  The patient agrees to proceed with surgery of the right carotid artery.  I would  proceed with oncologic surgery at this point given the characteristics of this lesion as well as the rather mild disease on the left side I believe the risks are more favorable than moving forward emergently with carotid surgery and then necessitating dual antiplatelet therapy and delaying for several months the ovarian surgery.   Continue antiplatelet therapy as prescribed. Continue management of CAD, HTN and Hyperlipidemia. Healthy heart diet, encouraged exercise at least 4 times per week.    2. Infrarenal abdominal aortic aneurysm (AAA) without rupture (HCC) Recommend: No surgery or intervention is indicated at this time.   The patient has an asymptomatic abdominal aortic aneurysm that is less than 4 cm in maximal diameter.     I have reviewed the natural history of abdominal aortic aneurysm and the small risk of rupture for aneurysm less than 5 cm in size.   However, as these small aneurysms tend to enlarge over time, continued surveillance with ultrasound or CT scan is mandatory.    I have also discussed optimizing medical management with hypertension and lipid control and the importance of abstinence from tobacco.  The patient is also encouraged to exercise a minimum of 30 minutes 4 times a week.    Should the patient develop new onset abdominal or back pain or signs of peripheral embolization they are instructed to seek medical attention immediately and to alert the physician providing care that they have an aneurysm.    The patient voices their understanding.   The patient will return in 12 months with an aortic duplex.     3. Atherosclerosis of native artery of both lower extremities with intermittent claudication (HCC) Recommend:   The patient has evidence of atherosclerosis of the lower extremities with claudication.  The patient does not voice lifestyle limiting changes at this point in time.   Noninvasive studies do not suggest clinically significant change.   No invasive  studies, angiography or surgery at this time The patient should continue walking and begin a more formal exercise program.  The patient should continue antiplatelet therapy and aggressive treatment of the lipid abnormalities   No changes in the patient's medications at this time   Continued surveillance is indicated as atherosclerosis is likely to progress with time.     The patient will continue follow up with noninvasive studies as ordered.    4. CAD in native artery Continue cardiac and antihypertensive medications as already ordered and reviewed, no changes at this time.   Continue statin as ordered and reviewed, no changes at this time   Nitrates PRN for chest pain   5. Primary hypertension Continue antihypertensive medications as already ordered, these medications have been reviewed and there are no changes at this time.   Levora Dredge, MD  11/09/2023 7:18 AM

## 2023-11-09 NOTE — Transfer of Care (Signed)
Immediate Anesthesia Transfer of Care Note  Patient: Laura Nielsen  Procedure(s) Performed: ENDARTERECTOMY CAROTID (Right)  Patient Location: PACU  Anesthesia Type:General  Level of Consciousness: awake and drowsy  Airway & Oxygen Therapy: Patient Spontanous Breathing and Patient connected to face mask oxygen  Post-op Assessment: Report given to RN and Post -op Vital signs reviewed and stable  Post vital signs: Reviewed and stable  Last Vitals:  Vitals Value Taken Time  BP 123/70 11/09/23 1100  Temp    Pulse 60 11/09/23 1107  Resp 18 11/09/23 1107  SpO2 100 % 11/09/23 1107    Last Pain:  Vitals:   11/09/23 0644  TempSrc: Temporal  PainSc: 3          Complications: No notable events documented.

## 2023-11-09 NOTE — Anesthesia Procedure Notes (Signed)
Procedure Name: Intubation Date/Time: 11/09/2023 7:51 AM  Performed by: Reece Agar, CRNAPre-anesthesia Checklist: Patient identified, Emergency Drugs available, Suction available and Patient being monitored Patient Re-evaluated:Patient Re-evaluated prior to induction Oxygen Delivery Method: Circle system utilized Preoxygenation: Pre-oxygenation with 100% oxygen Induction Type: IV induction Ventilation: Mask ventilation without difficulty Laryngoscope Size: McGrath and 3 Grade View: Grade I Tube type: Oral Number of attempts: 1 Airway Equipment and Method: Stylet and LTA kit utilized Placement Confirmation: ETT inserted through vocal cords under direct vision, positive ETCO2 and breath sounds checked- equal and bilateral Secured at: 21 cm Tube secured with: Tape Dental Injury: Teeth and Oropharynx as per pre-operative assessment

## 2023-11-09 NOTE — Op Note (Addendum)
[facility]   OPERATIVE NOTE  PROCEDURE:   1.  Right carotid endarterectomy with bovine pericardial arterial patch reconstruction  PRE-OPERATIVE DIAGNOSIS: 1.  Critical carotid stenosis   POST-OPERATIVE DIAGNOSIS: same as above   SURGEON: Renford Dills, MD  ASSISTANT(S): Rolla Plate, NP  ANESTHESIA: general  ESTIMATED BLOOD LOSS: 50 cc  FINDING(S): 1.  Extensive calcified carotid plaque.  SPECIMEN(S):  Carotid plaque (sent to Pathology)  INDICATIONS:   Laura Nielsen is a 67 y.o. y.o. female who presents with right carotid stenosis of 90%.  The risks, benefits, and alternatives to carotid endarterectomy were discussed with the patient. The differences between carotid stenting and carotid endarterectomy were reviewed.  The patient voiced understanding and appears to be aware that the risks of carotid endarterectomy include but are not limited to: bleeding, infection, stroke, myocardial infarction, death, cranial nerve injuries both temporary and permanent, neck hematoma, possible airway compromise, labile blood pressure post-operatively, cerebral hyperperfusion syndrome, and possible need for additional interventions in the future. The patient is aware of the risks and agrees to proceed forward with the procedure.  DESCRIPTION: After full informed written consent was obtained from the patient, the patient was brought back to the operating room and placed supine upon the operating table.  Prior to induction, the patient received IV antibiotics.  After obtaining adequate anesthesia, the patient was placed a supine position with a shoulder roll in place and the patient's neck slightly hyperextended and rotated away from the surgical site.  The patient was prepped in the standard fashion for a carotid endarterectomy.    A first assistant was required to provide a safe and appropriate environment for executing the surgery.  The assistant was integral in providing  retraction, exposure, running suture providing suction and in the closing process.  The incision was made anterior to the sternocleidomastoid muscle and dissected down through the subcutaneous tissue.  The platysmas was opened with electrocautery.  The internal jugular vein and facial vein were identified.  The facial vein is ligated and divided between 2-0 silk ties.  The omohyoid was identified in the common carotid artery exposed at this level. The dissection was there in carried out along the carotid artery in a cranial direction.  The dissection was then carried along periadventitial plane along the common carotid artery up to the bifurcation. The external carotid artery was identified. Vessel loops were then placed around the external carotid artery as well as the superior thyroid artery. In the process of this dissection, the hypoglossal nerve was identified and protected from harm.  The internal carotid artery was then dissected circumferentially just beyond an area in the internal carotid artery distal to the plaque.    At this point, we gave the patient 7000 units of intravenous heparin.  After this was allowed to circulate for several minutes, the common carotid followed by the external carotid and then the internal carotid artery were clamped.  Arteriotomy was made in the common carotid artery with a 11 blade, and extended the arteriotomy with a Potts scissor down into the common carotid artery, then the arteriotomy was carried through the bifurcation into the internal carotid artery until I reached an area that was not diseased.  At this point, a Sundt shunt was placed.  The endarterectomy was begun in the common carotid artery with a Runner, broadcasting/film/video and carried this dissection down into the common carotid artery circumferentially.  Then I transected the plaque at a segment where it was adherent  and transected the plaque with Potts scissors.  I then carried this dissection up into the external  carotid artery.  The plaque was extracted by unclamping the external carotid artery and performing an eversion endarterectomy.  The dissection was then carried into the internal carotid artery where a  feathered end point was created.  The plaque was passed off the field as a specimen.  The distal endpoint was tacked down with 6 interrupted 7-0 Prolene sutures.  A Bovine pericardial arterial patch was delivered onto the field and trimmed appropriately for the artery and sewed in place with 6-0 Prolene using a 4 quadrant technique.  The medial suture line was completed and the lateral suture line was run approximately one quarter the length of the arteriotomy.  Prior to completing this patch angioplasty, the shunt was removed, the internal carotid artery was flushed and there was excellent backbleeding.  The carotid artery repair was flushed with heparinized saline and then the patch angioplasty was completed in the usual fashion.  The flow was then reestablished first to the external carotid artery and then the internal carotid artery to prevent distal embolization.   Several minutes of pressure were held and 6-0 Prolene patch sutures were used as need for hemostasis.  At this point, I placed Surgicel and Evicel topical hemostatic agents.  There was no more active bleeding in the surgical site.  The sternocleidomastoid space was closed with three interrupted 3-0 Vicryl sutures. I then reapproximated the platysma muscle with a running stitch of 3-0 Vicryl.  The skin was then closed with a running subcuticular 4-0 Monocryl.  The skin was then cleaned, dried and Dermabond was used to reinforce the skin closure.  The patient awakened and was taken to the recovery room in stable condition, following commands and moving all four extremities without any apparent deficits.    COMPLICATIONS: none  CONDITION: stable  Laura Nielsen 11/09/2023<10:50 AM

## 2023-11-09 NOTE — Interval H&P Note (Signed)
History and Physical Interval Note:  11/09/2023 7:21 AM  Laura Nielsen  has presented today for surgery, with the diagnosis of BILATERAL CAROTID ARTERY STENOSIS.  The various methods of treatment have been discussed with the patient and family. After consideration of risks, benefits and other options for treatment, the patient has consented to  Procedure(s): ENDARTERECTOMY CAROTID (Right) as a surgical intervention.  The patient's history has been reviewed, patient examined, no change in status, stable for surgery.  I have reviewed the patient's chart and labs.  Questions were answered to the patient's satisfaction.     Levora Dredge

## 2023-11-09 NOTE — Anesthesia Preprocedure Evaluation (Signed)
Anesthesia Evaluation  Patient identified by MRN, date of birth, ID band Patient awake  General Assessment Comment:Chemo, port right chest. No new medical issues since last cataract last month.  Reviewed: Allergy & Precautions, H&P , NPO status , Patient's Chart, lab work & pertinent test results  History of Anesthesia Complications Negative for: history of anesthetic complications  Airway Mallampati: II  TM Distance: >3 FB Neck ROM: Full    Dental  (+) Poor Dentition, Loose, Chipped, Dental Advidsory Given Some missing, loose left upper tooth and loose left central incisor, needs dental work, doesn't get numb from local anesthesia, says she was told she is "too high risk to be put to sleep," but has not been told to get cardiac clearance/letter of risk, nor pulmonary letter of risk: :   Pulmonary neg shortness of breath, asthma , sleep apnea , COPD, neg recent URI, Current Smoker and Patient abstained from smoking.   Pulmonary exam normal breath sounds clear to auscultation       Cardiovascular Exercise Tolerance: Poor METShypertension, (-) angina + CAD, + Cardiac Stents and + Peripheral Vascular Disease  (-) Past MI Normal cardiovascular exam(-) dysrhythmias (-) Valvular Problems/Murmurs Rhythm:Regular Rate:Normal  Xience DES mid RCA 03/16/2009 2. Endovascular repair of abdominal aortic aneurysm 3. Essential hypertension 4. Hyperlipidemia 5. COPD / ongoing tobacco abuse 6. Recurrent stage IV left lower lobe lung cancer on Keytruda   Patient previously followed by Dr. Lady Gary. She is status post Xience DES mid RCA 03/16/2009. She also is status post endovascular repair of abdominal aortic aneurysm followed by Dr. Lorretta Harp.   She returns today, reports "doing okay". She denies chest pain. She has chronic exertional dyspnea due to underlying COPD and ongoing tobacco abuse. The patient continues smoke less than a pack of cigarettes per day.  Oxygen saturation 96% on room air. She denies palpitations or heart racing. She denies peripheral edema. The patient is active but does not exercise regularly.    ECG/11/2021 revealed normal sinus rhythm at 76 bpm.    2D echocardiogram 10/22/2017 revealed LVEF greater than 55% with mild tricuspid regurgitation.    Neuro/Psych  Headaches, neg Seizures PSYCHIATRIC DISORDERS  Depression     Neuromuscular disease    GI/Hepatic Neg liver ROS,GERD  Controlled,,Severe GERD, partly controlled w/pantropazole, didn't take pantoprazole today   Endo/Other  diabetes  Obesity On GLP1, last taken 11 days ago. Denies GI symptoms today  Renal/GU negative Renal ROS  negative genitourinary   Musculoskeletal  (+) Arthritis ,    Abdominal  (+) + obese  Peds negative pediatric ROS (+)  Hematology negative hematology ROS (+)   Anesthesia Other Findings Previous cataract surgery 05-14-23  AAA (abdominal aortic aneurysm) (HCC)               s/p EVAR AAA 03/27/13 Arthritis                                     Asthma                                     Cancer (HCC)                          Complication of anesthesia--BP drops Coronary artery disease  History of kidney stones                                  Osteoporosis                                     Sleep apnea   Reproductive/Obstetrics negative OB ROS                             Anesthesia Physical Anesthesia Plan  ASA: 3  Anesthesia Plan: General   Post-op Pain Management: Minimal or no pain anticipated   Induction: Intravenous  PONV Risk Score and Plan: 2 and Midazolam, Ondansetron, Dexamethasone and Treatment may vary due to age or medical condition  Airway Management Planned: Oral ETT  Additional Equipment:   Intra-op Plan:   Post-operative Plan: Extubation in OR  Informed Consent: I have reviewed the patients History and Physical, chart, labs and discussed the procedure  including the risks, benefits and alternatives for the proposed anesthesia with the patient or authorized representative who has indicated his/her understanding and acceptance.     Dental Advisory Given  Plan Discussed with: Anesthesiologist, CRNA and Surgeon  Anesthesia Plan Comments: (Patient consented for risks of anesthesia including but not limited to:  - adverse reactions to medications - damage to eyes, teeth, lips or other oral mucosa - nerve damage due to positioning  - sore throat or hoarseness - Damage to heart, brain, nerves, lungs, other parts of body or loss of life  Patient voiced understanding. Very concerned that local anesthesia will be ineffective, but patient states it is "sometimes" effective, and wishes to try today. IF not effective, will need cardiac letter of risk prior to intubation and GETA. )        Anesthesia Quick Evaluation

## 2023-11-10 LAB — BASIC METABOLIC PANEL
Anion gap: 9 (ref 5–15)
BUN: 17 mg/dL (ref 8–23)
CO2: 22 mmol/L (ref 22–32)
Calcium: 9.4 mg/dL (ref 8.9–10.3)
Chloride: 108 mmol/L (ref 98–111)
Creatinine, Ser: 0.6 mg/dL (ref 0.44–1.00)
GFR, Estimated: >60 mL/min (ref >60)
Glucose, Bld: 131 mg/dL — ABNORMAL HIGH (ref 70–99)
Potassium: 3.9 mmol/L (ref 3.5–5.1)
Sodium: 139 mmol/L (ref 135–145)

## 2023-11-10 LAB — CBC
HCT: 41.6 % (ref 36.0–46.0)
Hemoglobin: 13.9 g/dL (ref 12.0–15.0)
MCH: 28.7 pg (ref 26.0–34.0)
MCHC: 33.4 g/dL (ref 30.0–36.0)
MCV: 86 fL (ref 80.0–100.0)
Platelets: 244 10*3/uL (ref 150–400)
RBC: 4.84 MIL/uL (ref 3.87–5.11)
RDW: 13.8 % (ref 11.5–15.5)
WBC: 18.2 10*3/uL — ABNORMAL HIGH (ref 4.0–10.5)
nRBC: 0 % (ref 0.0–0.2)

## 2023-11-10 LAB — GLUCOSE, CAPILLARY
Glucose-Capillary: 117 mg/dL — ABNORMAL HIGH (ref 70–99)
Glucose-Capillary: 135 mg/dL — ABNORMAL HIGH (ref 70–99)

## 2023-11-10 MED ORDER — ACETAMINOPHEN 325 MG PO TABS: 325.0000 mg | ORAL_TABLET | ORAL | Status: DC | PRN

## 2023-11-10 MED ORDER — OXYCODONE HCL 5 MG PO TABS: 5.0000 mg | ORAL_TABLET | ORAL | 0 refills | Status: AC | PRN

## 2023-11-10 MED ORDER — PANTOPRAZOLE SODIUM 40 MG PO TBEC: 40.0000 mg | DELAYED_RELEASE_TABLET | Freq: Every day | ORAL | Status: DC

## 2023-11-10 NOTE — Anesthesia Postprocedure Evaluation (Signed)
Anesthesia Post Note  Patient: Laura Nielsen  Procedure(s) Performed: ENDARTERECTOMY CAROTID (Right)  Patient location during evaluation: ICU Anesthesia Type: General Level of consciousness: awake and alert Pain management: pain level controlled Vital Signs Assessment: post-procedure vital signs reviewed and stable Respiratory status: spontaneous breathing, nonlabored ventilation, respiratory function stable and patient connected to nasal cannula oxygen Cardiovascular status: blood pressure returned to baseline and stable Postop Assessment: no apparent nausea or vomiting Anesthetic complications: no   No notable events documented.   Last Vitals:  Vitals:   11/10/23 0830 11/10/23 0845  BP: (!) 94/47 (!) 91/55  Pulse: (!) 47 (!) 102  Resp: 16 14  Temp:  36.8 C  SpO2: 93% 95%    Last Pain:  Vitals:   11/10/23 0845  TempSrc: Oral  PainSc: 4                  Cleda Mccreedy Savaughn Karwowski

## 2023-11-10 NOTE — Discharge Instructions (Addendum)
Take your blood pressure in the morning and in the evening for the next few days Do not take any blood pressure medications such as (lisinoprol/hydrochlorothiazide) unless your systolic blood pressure, the top number, is 95 or higher.  Do not take any betablockers (bisoprolol) until Dr. Marijean Heath office has advised it is safe for you to do so.  Get up slowly, and if you have symptoms of dizziness, or weakness, sit down to rest, and take your blood pressure when you are able to do so. If your blood pressure is low and you have persistent symptoms of weakness or dizziness, go to the ER or call 911.  Call Dr. Marijean Heath office to Monday to arrange follow-up appointment. Stay hydrated.

## 2023-11-10 NOTE — Discharge Summary (Signed)
Physician Discharge Summary  Patient ID: Laura Nielsen MRN: [id number] DOB/AGE: [birth year] 67 y.o.  Admit date: 11/09/2023 Discharge date: 11/10/2023  Admission Diagnoses: Severe asymptomatic right carotid artery stenosis  Discharge Diagnoses:  Principal Problem:   Carotid stenosis, asymptomatic, right   Discharged Condition: good  Hospital Course: Patient was admitted same day for endarterectomy which was performed on the right uneventfully.  She was transferred to the recovery room and subsequently to the ICU where she required dopamine overnight for low heart rate and blood pressure, which has been tapered at this point.  She is being weaned off.  She is alert, conversant, oriented.  No evidence of adverse cranial nerve sequela or otherwise.  She wants to go home.  Consults: pulmonary/intensive care  Significant Diagnostic Studies: None  Treatments: IV hydration, antibiotics: Ancef, analgesia: acetaminophen, acetaminophen w/ codeine, and Morphine, anticoagulation: ASA and Plavix, and surgery: Right carotid endarterectomy  Discharge Exam: Blood pressure (!) 95/44, pulse (!) 40, temperature 98.3 F (36.8 C), temperature source Oral, resp. rate 15, height 5\' 8"  (1.727 m), weight 88.9 kg, SpO2 95%. General appearance: alert, cooperative, and no distress Cranial nerves appear grossly intact.  Speech is crisp.  There is no deviation of the tongue.  Incision is clean dry and intact as well.  Some bruising, within the level of anticipated.  Diet is being advanced, dopamine weaned, mobility initiated.  Plan is for DC later today.  All questions were answered.  Disposition: Discharge disposition: 01-Home or Self Care       Discharge Instructions     Call MD for:  redness, tenderness, or signs of infection (pain, swelling, bleeding, redness, odor or green/yellow discharge around incision site)   Complete by: As directed    Call MD for:  severe or increased pain, loss or decreased  feeling  in affected limb(s)   Complete by: As directed    Call MD for:  temperature >100.5   Complete by: As directed    Discharge instructions   Complete by: As directed    Take Tylenol and/or Motrin (ibuprofen) as needed for pain, Percocet if not enough.  Percocet prescription sent to [facility] May shower, wash gently with soap and water, pat dry, leave incision open to air Take it easy for a few days, do not drive until neck is supple and no narcotic pain medicine is required   Driving Restrictions   Complete by: As directed    No driving for until seen in clinic   Lifting restrictions   Complete by: As directed    No lifting for until seen in clinic   No dressing needed   Complete by: As directed    Replace only if drainage present   Resume previous diet   Complete by: As directed       Allergies as of 11/10/2023       Reactions   Atorvastatin Other (See Comments)   Elevated blood sugar   Omeprazole Nausea And Vomiting   Bupropion Nausea Only   Only on the 150mg  tablets, but the 100mg  didn't help with smoking        Medication List     TAKE these medications    acetaminophen 500 MG tablet Commonly known as: TYLENOL Take 500 mg by mouth every 6 (six) hours as needed for moderate pain (pain score 4-6). What changed: Another medication with the same name was added. Make sure you understand how and when to take each.   acetaminophen 325 MG  tablet Commonly known as: TYLENOL Take 1-2 tablets (325-650 mg total) by mouth every 4 (four) hours as needed for mild pain (pain score 1-3) or moderate pain (pain score 4-6) (or temp >/= 101 F). What changed: You were already taking a medication with the same name, and this prescription was added. Make sure you understand how and when to take each.   allopurinol 100 MG tablet Commonly known as: ZYLOPRIM Take 1 tablet (100 mg total) by mouth daily.   aspirin 81 MG tablet Take 81 mg by mouth daily.   bisoprolol  10 MG tablet Commonly known as: ZEBETA Take 1 tablet (10 mg total) by mouth daily.   Breztri Aerosphere 160-9-4.8 MCG/ACT Aero Generic drug: Budeson-Glycopyrrol-Formoterol Inhale 2 puffs into the lungs in the morning and at bedtime.   clopidogrel 75 MG tablet Commonly known as: PLAVIX Take 1 tablet (75 mg total) by mouth daily.   ibuprofen 200 MG tablet Commonly known as: ADVIL Take 200 mg by mouth 2 (two) times daily as needed (headaches).   lidocaine-prilocaine cream Commonly known as: EMLA Apply on the port. 30 -45 min  prior to port access.   lisinopril-hydrochlorothiazide 20-12.5 MG tablet Commonly known as: ZESTORETIC Take 1 tablet by mouth daily. What changed: how much to take   magnesium gluconate 500 MG tablet Commonly known as: MAGONATE Take 500 mg by mouth at bedtime.   oxyCODONE 5 MG immediate release tablet Commonly known as: Oxy IR/ROXICODONE Take 1-2 tablets (5-10 mg total) by mouth every 4 (four) hours as needed for moderate pain (pain score 4-6).   Ozempic (1 MG/DOSE) 4 MG/3ML Sopn Generic drug: Semaglutide (1 MG/DOSE) Inject 1 mg into the skin once a week.   pantoprazole 20 MG tablet Commonly known as: PROTONIX Take 1 tablet (20 mg total) by mouth daily.   ProAir HFA 108 (90 Base) MCG/ACT inhaler Generic drug: albuterol Inhale 2 puffs into the lungs every 6 (six) hours as needed for wheezing or shortness of breath.   rosuvastatin 20 MG tablet Commonly known as: CRESTOR Take 1 tablet (20 mg total) by mouth daily.   Systane Preservative Free 0.4-0.3 % Soln Generic drug: Polyethyl Glyc-Propyl Glyc PF Place 1 drop into both eyes daily as needed (dry eyes).         Signed: Learta Codding 11/10/2023, 9:38 AM

## 2023-11-10 NOTE — Progress Notes (Signed)
Dr. Renne Crigler in to round on patient this am; pt for pending DC today after BP and HR improve; remains on small dose Dopamine, with borderline BP's and bradycardia into 40's. Pt denies dizziness, CP or other associated symptoms. Complains of soreness to right side of neck, which worsens with activity, but pt declines pain medication at this time. Betablocker, diuretic, and antihypertensive held today. Eating breakfast at this time. Foley out; awaiting patient void.

## 2023-11-10 NOTE — Progress Notes (Signed)
PHARMACIST - PHYSICIAN COMMUNICATION  DR:  Gilda Crease  CONCERNING: IV to Oral Route Change Policy  RECOMMENDATION: This patient is receiving pantoprazole by the intravenous route.  Based on criteria approved by the Pharmacy and Therapeutics Committee, the intravenous medication(s) is/are being converted to the equivalent oral dose form(s).   DESCRIPTION: These criteria include: The patient is eating (either orally or via tube) and/or has been taking other orally administered medications for a least 24 hours The patient has no evidence of active gastrointestinal bleeding or impaired GI absorption (gastrectomy, short bowel, patient on TNA or NPO).  If you have questions about this conversion, please contact the Pharmacy Department  []   [phone] )  [facility] [x]   [phone] )  [facility] []   [phone] )  [facility] []   [phone] )  [facility] []   [phone] )  [facility]   Lowella Bandy, [facility] 11/10/2023 8:33 AM

## 2023-11-10 NOTE — Progress Notes (Signed)
Pt transported to DC via WC accompanied by nurse tech; alert and oriented.

## 2023-11-10 NOTE — Progress Notes (Signed)
Patient given verbal and written instructions regarding monitoring BP at home and following up with Dr. Marijean Heath office. Pt verbalized understanding of all instructions. Denies any symptoms of weakness or dizziness. PIV's are removed, CDI. Patient is dressed and awaiting her ride.

## 2023-11-12 ENCOUNTER — Telehealth (INDEPENDENT_AMBULATORY_CARE_PROVIDER_SITE_OTHER): Payer: Self-pay

## 2023-11-12 ENCOUNTER — Other Ambulatory Visit: Payer: Self-pay | Admitting: Internal Medicine

## 2023-11-12 ENCOUNTER — Telehealth: Payer: Self-pay

## 2023-11-12 NOTE — Telephone Encounter (Signed)
Patient had right endarterectomy on 11/09/23 and she was needing clarification on discharge instructions. Patient was informed that she can start driving on Wednesday, lifting restrictions are between 15-20 lbs, and she should follow up with PCP for b/p evaluation. Patient should follow up in 3-4 weeks with carotid see Schnier or Vivia Birmingham.

## 2023-11-12 NOTE — Telephone Encounter (Signed)
Patient called stating she had surgrey on Friday and Dr. Gilda Crease recommended for patient to have treatment on next week instead of 11/13/23. She states he said he believe this will be a lot on her. She states he has limited her driving and other day to day activities.

## 2023-11-13 ENCOUNTER — Inpatient Hospital Stay: Payer: Medicare Other

## 2023-11-13 ENCOUNTER — Inpatient Hospital Stay: Payer: Medicare Other | Admitting: Internal Medicine

## 2023-11-13 ENCOUNTER — Encounter: Payer: Self-pay | Admitting: Vascular Surgery

## 2023-11-13 LAB — SURGICAL PATHOLOGY

## 2023-11-15 ENCOUNTER — Other Ambulatory Visit: Payer: Self-pay | Admitting: Family Medicine

## 2023-11-15 NOTE — Telephone Encounter (Signed)
Requested Prescriptions  Pending Prescriptions Disp Refills   clopidogrel (PLAVIX) 75 MG tablet [Pharmacy Med Name: CLOPIDOGREL 75 MG TABLET] 90 tablet 0    Sig: Take 1 tablet (75 mg total) by mouth daily.     Hematology: Antiplatelets - clopidogrel Passed - 11/15/2023 11:26 AM      Passed - HCT in normal range and within 180 days    HCT  Date Value Ref Range Status  11/10/2023 41.6 36.0 - 46.0 % Final   Hematocrit  Date Value Ref Range Status  02/13/2022 47.7 (H) 34.0 - 46.6 % Final         Passed - HGB in normal range and within 180 days    Hemoglobin  Date Value Ref Range Status  11/10/2023 13.9 12.0 - 15.0 g/dL Final  78/46/9629 52.8 12.0 - 15.0 g/dL Final  41/32/4401 02.7 11.1 - 15.9 g/dL Final         Passed - PLT in normal range and within 180 days    Platelets  Date Value Ref Range Status  11/10/2023 244 150 - 400 K/uL Final  02/13/2022 237 150 - 450 x10E3/uL Final   Platelet Count  Date Value Ref Range Status  03/05/2023 264 150 - 400 K/uL Final         Passed - Cr in normal range and within 360 days    Creatinine  Date Value Ref Range Status  03/05/2023 0.64 0.44 - 1.00 mg/dL Final   Creat  Date Value Ref Range Status  08/30/2017 0.60 0.50 - 0.99 mg/dL Final    Comment:    For patients >33 years of age, the reference limit for Creatinine is approximately 13% higher for people identified as African-American. .    Creatinine, Ser  Date Value Ref Range Status  11/10/2023 0.60 0.44 - 1.00 mg/dL Final   Creatinine, POC  Date Value Ref Range Status  12/12/2017 n/a mg/dL Final         Passed - Valid encounter within last 6 months    Recent Outpatient Visits           3 months ago Primary hypertension   [facility] Malva Limes, MD   9 months ago Type 2 diabetes mellitus without complication, without long-term current use of insulin (HCC)   [place] [facility] Malva Limes, MD   1 year ago Type  2 diabetes mellitus without complication, without long-term current use of insulin (HCC)   [facility] [facility] Malva Limes, MD   1 year ago Type 2 diabetes mellitus without complication, without long-term current use of insulin (HCC)   [place] [facility] Malva Limes, MD   1 year ago Acute cough    [facility] Mecum, Oswaldo Conroy, PA-C       Future Appointments             In 1 week Fisher, Demetrios Isaacs, MD [facility]   In 1 month Raechel Chute, MD [facility] at [facility]

## 2023-11-16 ENCOUNTER — Ambulatory Visit: Payer: Medicare Other | Admitting: Family Medicine

## 2023-11-20 ENCOUNTER — Encounter: Payer: Self-pay | Admitting: Internal Medicine

## 2023-11-20 ENCOUNTER — Inpatient Hospital Stay: Payer: Medicare Other

## 2023-11-20 ENCOUNTER — Inpatient Hospital Stay (HOSPITAL_BASED_OUTPATIENT_CLINIC_OR_DEPARTMENT_OTHER): Payer: Medicare Other | Admitting: Internal Medicine

## 2023-11-20 VITALS — BP 133/80 | HR 71 | Temp 96.2°F | Ht 68.0 in | Wt 196.8 lb

## 2023-11-20 DIAGNOSIS — E039 Hypothyroidism, unspecified: Secondary | ICD-10-CM | POA: Diagnosis not present

## 2023-11-20 DIAGNOSIS — D270 Benign neoplasm of right ovary: Secondary | ICD-10-CM | POA: Diagnosis not present

## 2023-11-20 DIAGNOSIS — Z90722 Acquired absence of ovaries, bilateral: Secondary | ICD-10-CM | POA: Diagnosis not present

## 2023-11-20 DIAGNOSIS — C3432 Malignant neoplasm of lower lobe, left bronchus or lung: Secondary | ICD-10-CM

## 2023-11-20 DIAGNOSIS — E876 Hypokalemia: Secondary | ICD-10-CM | POA: Diagnosis not present

## 2023-11-20 DIAGNOSIS — C7972 Secondary malignant neoplasm of left adrenal gland: Secondary | ICD-10-CM | POA: Diagnosis not present

## 2023-11-20 DIAGNOSIS — Z5112 Encounter for antineoplastic immunotherapy: Secondary | ICD-10-CM | POA: Diagnosis not present

## 2023-11-20 LAB — COMPREHENSIVE METABOLIC PANEL
ALT: 15 U/L (ref 0–44)
AST: 20 U/L (ref 15–41)
Albumin: 3.5 g/dL (ref 3.5–5.0)
Alkaline Phosphatase: 78 U/L (ref 38–126)
Anion gap: 8 (ref 5–15)
BUN: 15 mg/dL (ref 8–23)
CO2: 25 mmol/L (ref 22–32)
Calcium: 9.4 mg/dL (ref 8.9–10.3)
Chloride: 107 mmol/L (ref 98–111)
Creatinine, Ser: 0.64 mg/dL (ref 0.44–1.00)
GFR, Estimated: >60 mL/min (ref >60)
Glucose, Bld: 91 mg/dL (ref 70–99)
Potassium: 3.8 mmol/L (ref 3.5–5.1)
Sodium: 140 mmol/L (ref 135–145)
Total Bilirubin: 0.6 mg/dL (ref <1.2)
Total Protein: 6.7 g/dL (ref 6.5–8.1)

## 2023-11-20 LAB — CBC WITH DIFFERENTIAL/PLATELET
Abs Immature Granulocytes: 0.02 10*3/uL (ref 0.00–0.07)
Basophils Absolute: 0 10*3/uL (ref 0.0–0.1)
Basophils Relative: 1 %
Eosinophils Absolute: 0.3 10*3/uL (ref 0.0–0.5)
Eosinophils Relative: 4 %
HCT: 42 % (ref 36.0–46.0)
Hemoglobin: 13.9 g/dL (ref 12.0–15.0)
Immature Granulocytes: 0 %
Lymphocytes Relative: 33 %
Lymphs Abs: 2.5 10*3/uL (ref 0.7–4.0)
MCH: 28.8 pg (ref 26.0–34.0)
MCHC: 33.1 g/dL (ref 30.0–36.0)
MCV: 87 fL (ref 80.0–100.0)
Monocytes Absolute: 0.6 10*3/uL (ref 0.1–1.0)
Monocytes Relative: 8 %
Neutro Abs: 4.1 10*3/uL (ref 1.7–7.7)
Neutrophils Relative %: 54 %
Platelets: 232 10*3/uL (ref 150–400)
RBC: 4.83 MIL/uL (ref 3.87–5.11)
RDW: 14.1 % (ref 11.5–15.5)
WBC: 7.5 10*3/uL (ref 4.0–10.5)
nRBC: 0 % (ref 0.0–0.2)

## 2023-11-20 MED ORDER — PEMBROLIZUMAB CHEMO INJECTION 100 MG/4ML
200.0000 mg | Freq: Once | INTRAVENOUS | Status: AC
  Administered 2023-11-20: 200 mg via INTRAVENOUS
  Filled 2023-11-20: qty 8

## 2023-11-20 MED ORDER — SODIUM CHLORIDE 0.9 % IV SOLN
Freq: Once | INTRAVENOUS | Status: AC
  Filled 2023-11-20: qty 250

## 2023-11-20 MED ORDER — HEPARIN SOD (PORK) LOCK FLUSH 100 UNIT/ML IV SOLN
500.0000 [IU] | Freq: Once | INTRAVENOUS | Status: AC | PRN
  Administered 2023-11-20: 500 [IU]
  Filled 2023-11-20: qty 5

## 2023-11-20 NOTE — Patient Instructions (Signed)
 [facility] - A DEPT OF [facility]  Discharge Instructions: Thank you for choosing [facility] to provide your oncology and hematology care.  If you have a lab appointment with the Cancer Center, please go directly to the Cancer Center and check in at the registration area.  Wear comfortable clothing and clothing appropriate for easy access to any Portacath or PICC line.   We strive to give you quality time with your provider. You may need to reschedule your appointment if you arrive late (15 or more minutes).  Arriving late affects you and other patients whose appointments are after yours.  Also, if you miss three or more appointments without notifying the office, you may be dismissed from the clinic at the provider's discretion.      For prescription refill requests, have your pharmacy contact our office and allow 72 hours for refills to be completed.    Today you received the following chemotherapy and/or immunotherapy agents KEYTRUDA      To help prevent nausea and vomiting after your treatment, we encourage you to take your nausea medication as directed.  BELOW ARE SYMPTOMS THAT SHOULD BE REPORTED IMMEDIATELY: *FEVER GREATER THAN 100.4 F (38 C) OR HIGHER *CHILLS OR SWEATING *NAUSEA AND VOMITING THAT IS NOT CONTROLLED WITH YOUR NAUSEA MEDICATION *UNUSUAL SHORTNESS OF BREATH *UNUSUAL BRUISING OR BLEEDING *URINARY PROBLEMS (pain or burning when urinating, or frequent urination) *BOWEL PROBLEMS (unusual diarrhea, constipation, pain near the anus) TENDERNESS IN MOUTH AND THROAT WITH OR WITHOUT PRESENCE OF ULCERS (sore throat, sores in mouth, or a toothache) UNUSUAL RASH, SWELLING OR PAIN  UNUSUAL VAGINAL DISCHARGE OR ITCHING   Items with * indicate a potential emergency and should be followed up as soon as possible or go to the Emergency Department if any problems should occur.  Please show the CHEMOTHERAPY ALERT CARD or IMMUNOTHERAPY  ALERT CARD at check-in to the Emergency Department and triage nurse.  Should you have questions after your visit or need to cancel or reschedule your appointment, please contact [facility] - A DEPT OF [facility] [place]  [phone] and follow the prompts.  Office hours are 8:00 a.m. to 4:30 p.m. Monday - Friday. Please note that voicemails left after 4:00 p.m. may not be returned until the following business day.  We are closed weekends and major holidays. You have access to a nurse at all times for urgent questions. Please call the main number to the clinic [phone] and follow the prompts.  For any non-urgent questions, you may also contact your provider using MyChart. We now offer e-Visits for anyone 71 and older to request care online for non-urgent symptoms. For details visit [email or web address].   Also download the MyChart app! Go to the app store, search "MyChart", open the app, select [place], and log in with your MyChart username and password.   Pembrolizumab Injection What is this medication? PEMBROLIZUMAB (PEM broe LIZ ue mab) treats some types of cancer. It works by helping your immune system slow or stop the spread of cancer cells. It is a monoclonal antibody. This medicine may be used for other purposes; ask your health care provider or pharmacist if you have questions. COMMON BRAND NAME(S): Keytruda What should I tell my care team before I take this medication? They need to know if you have any of these conditions: Allogeneic stem cell transplant (uses someone else's stem cells) Autoimmune diseases, such as Crohn  disease, ulcerative colitis, lupus History of chest radiation Nervous system problems, such as Guillain-Barre syndrome, myasthenia gravis Organ transplant An unusual or allergic reaction to pembrolizumab, other medications, foods, dyes, or preservatives Pregnant or trying to get pregnant Breast-feeding How should I use this  medication? This medication is injected into a vein. It is given by your care team in a hospital or clinic setting. A special MedGuide will be given to you before each treatment. Be sure to read this information carefully each time. Talk to your care team about the use of this medication in children. While it may be prescribed for children as young as 6 months for selected conditions, precautions do apply. Overdosage: If you think you have taken too much of this medicine contact a poison control center or emergency room at once. NOTE: This medicine is only for you. Do not share this medicine with others. What if I miss a dose? Keep appointments for follow-up doses. It is important not to miss your dose. Call your care team if you are unable to keep an appointment. What may interact with this medication? Interactions have not been studied. This list may not describe all possible interactions. Give your health care provider a list of all the medicines, herbs, non-prescription drugs, or dietary supplements you use. Also tell them if you smoke, drink alcohol, or use illegal drugs. Some items may interact with your medicine. What should I watch for while using this medication? Your condition will be monitored carefully while you are receiving this medication. You may need blood work while taking this medication. This medication may cause serious skin reactions. They can happen weeks to months after starting the medication. Contact your care team right away if you notice fevers or flu-like symptoms with a rash. The rash may be red or purple and then turn into blisters or peeling of the skin. You may also notice a red rash with swelling of the face, lips, or lymph nodes in your neck or under your arms. Tell your care team right away if you have any change in your eyesight. Talk to your care team if you may be pregnant. Serious birth defects can occur if you take this medication during pregnancy and for 4  months after the last dose. You will need a negative pregnancy test before starting this medication. Contraception is recommended while taking this medication and for 4 months after the last dose. Your care team can help you find the option that works for you. Do not breastfeed while taking this medication and for 4 months after the last dose. What side effects may I notice from receiving this medication? Side effects that you should report to your care team as soon as possible: Allergic reactions--skin rash, itching, hives, swelling of the face, lips, tongue, or throat Dry cough, shortness of breath or trouble breathing Eye pain, redness, irritation, or discharge with blurry or decreased vision Heart muscle inflammation--unusual weakness or fatigue, shortness of breath, chest pain, fast or irregular heartbeat, dizziness, swelling of the ankles, feet, or hands Hormone gland problems--headache, sensitivity to light, unusual weakness or fatigue, dizziness, fast or irregular heartbeat, increased sensitivity to cold or heat, excessive sweating, constipation, hair loss, increased thirst or amount of urine, tremors or shaking, irritability Infusion reactions--chest pain, shortness of breath or trouble breathing, feeling faint or lightheaded Kidney injury (glomerulonephritis)--decrease in the amount of urine, red or dark brown urine, foamy or bubbly urine, swelling of the ankles, hands, or feet Liver injury--right upper belly pain,  loss of appetite, nausea, light-colored stool, dark yellow or brown urine, yellowing skin or eyes, unusual weakness or fatigue Pain, tingling, or numbness in the hands or feet, muscle weakness, change in vision, confusion or trouble speaking, loss of balance or coordination, trouble walking, seizures Rash, fever, and swollen lymph nodes Redness, blistering, peeling, or loosening of the skin, including inside the mouth Sudden or severe stomach pain, bloody diarrhea, fever, nausea,  vomiting Side effects that usually do not require medical attention (report to your care team if they continue or are bothersome): Bone, joint, or muscle pain Diarrhea Fatigue Loss of appetite Nausea Skin rash This list may not describe all possible side effects. Call your doctor for medical advice about side effects. You may report side effects to FDA at 1-800-FDA-1088. Where should I keep my medication? This medication is given in a hospital or clinic. It will not be stored at home. NOTE: This sheet is a summary. It may not cover all possible information. If you have questions about this medicine, talk to your doctor, pharmacist, or health care provider.  2024 Elsevier/Gold Standard (2022-04-04 00:00:00)

## 2023-11-20 NOTE — Progress Notes (Signed)
Surgery 11/09/23, Dr. Gilda Crease, Severe asymptomatic right carotid artery stenosis.  Appetite 50% normal, no supplement drinks. Has some nausea at times.   Constipation, uses an enema.

## 2023-11-20 NOTE — Assessment & Plan Note (Addendum)
#  JUNE 2023-STAGE IV--recurrent/metastatic disease with New left adrenal metastasis;  PET scan JUNE 20th- 2023-  Enlarged 4 cm hypermetabolic LEFT adrenal metastasis;  Suspicion of metastatic adenopathy to the LEFT hilum.  Part solid nodule in the RIGHT lower lobe without metabolic activity. 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 Currently on single agent Keytruda [PD-L1 greater than 80%].    # CT CAP with contrast- DEC 12th, 2024- Status post left lower lobectomy. No evidence of recurrent or metastatic disease in the chest/abdomen/pelvis.   # Proceed of Keytruda single agent. Labs today reviewed;  acceptable for treatment today. Tolerating well except for mild chills. But will hold off further immunotherapy [chemo holiday] given recent CT scans negative for any recurrent malignancy given patient emotional state.   # CT scan-DEC 2024- Similar right lower lobe ground-glass densities with differential considerations of scarring or metachronous low-grade adenocarcinoma. Monitor for now.   # Right mildly complex cystic adnexal mass noted-June 13th, 2024- Significant interval enlargement of a multiseptated cystic lesion of the right ovary, now measuring 11.7 x 8.2 cm, previously 8.1 x 5.8 cm.- s/p in [facility] [October, 2024]- mucinous cystadenoma - stable.  # mild Hypokalemia: discussed dietary supplement. stable  # Chronic pain: headaches/cervical pain/back pain [Dr.Naveira; pain doctor]  On NSAIDs [ monitor for now. OCT 2023- MRI brain- NEGATIVE for any metastatic disease.   Stable.  FEB 2024- MRI lumbar spine- degenerative disease- on ibuprofen prn- BID.  stable  # Left rib pain-Post throacotomy pain/tingling and numbness:  Continue gabapentin -300 mg TID; and then at extra at night prn. stable  # CAD/ PVD- cramping- s/p evaluation [Dr.Schneir]; ? Carotid occlusion- s/p CEA surgery- stable.    # COPD-stable encouraged continue to avoid smoking; again counseled to quit smoking; s/p pulmonary; awaiting PFTs- stable  # IV Access :s/p  port placement stable  *AM appts- Monday-   # DISPOSITION:  # keytruda today; # follow up in 2 months- -MD labs/port- cbc/cmp;TSH;  NO treatment-  Dr. B  # I reviewed the blood work- with the patient in detail; also reviewed the imaging independently [as summarized above]; and with the patient in detail.

## 2023-11-20 NOTE — Progress Notes (Signed)
[facility] CONSULT NOTE  Patient Care Team: Malva Limes, MD as PCP - General (Family Medicine) Lady Gary Darlin Priestly, MD as Consulting Physician (Cardiology) Schnier, Latina Craver, MD (Vascular Surgery) Lonell Face, MD as Consulting Physician (Neurology) Delano Metz, MD as Referring Physician (Pain Medicine) Pa, [facility] (Optometry) Lady Gary, Darlin Priestly, MD as Consulting Physician (Cardiology) Pasty Spillers, MD (Inactive) as Consulting Physician (Gastroenterology) Nadara Mustard, MD as Referring Physician (Obstetrics and Gynecology) Glory Buff, RN as Oncology Nurse Navigator Earna Coder, MD as Consulting Physician (Oncology)   CHIEF COMPLAINTS/PURPOSE OF CONSULTATION: lung cancer  #  Oncology History Overview Note  #MAY 2022- LLL nodule 2.3 cm Adenocarcinoma Stage IA; s/p lobectomy.  No adjuvant therapy  # CT scan 4th June 2023-highly concerning for recurrent/metastatic disease with-. New left adrenal metastasis;  Ground-glass and part solid nodules in the peripheral right lower lobe, unchanged from 11/07/2021 but slightly enlarged from 01/01/2018. Findings are suspicious for indolent adenocarcinoma.  F One- PFL1 =80%; KRAS G12C PET scan JUNE 20th- 2023-  Enlarged 4 cm hypermetabolic LEFT adrenal metastasis;  Suspicion of metastatic adenopathy to the LEFT hilum.  Part solid nodule in the RIGHT lower lobe without metabolic activity.   3 ADRENAL BIOPSY- CK7 POSITIVE ADENOCARCINOMA- There is limited tissue remaining for ancillary testing, not likely  sufficient for NGS testing.   # AUG 7th, 2023-single agent Keytruda.    Right mildly complex cystic adnexal mass noted-June 13th, 2024- Significant interval enlargement of a multiseptated cystic lesion of the right ovary, now measuring 11.7 x 8.2 cm, previously 8.1 x 5.8 cm.- s/p in [facility] [October, 2024]- mucinous cystadenoma - stable.   Primary cancer of left lower lobe of lung (HCC)  05/09/2021  Initial Diagnosis   Primary cancer of left lower lobe of lung (HCC)   07/04/2022 -  Chemotherapy   Patient is on Treatment Plan : LUNG NSCLC Pembrolizumab (200) q21d     07/10/2022 - 07/10/2022 Chemotherapy   Patient is on Treatment Plan : LUNG NSCLC Pembrolizumab (200) q21d      HISTORY OF PRESENTING ILLNESS: Patient  ambulating.  Alone.   Laura Nielsen 67 y.o.  female history of smoking; prior history of lung cancer-with likely recurrence to left adrenal gland [s/p Bx CK-7 positive TTF-1 negative]-clinically suggestive of recurrent lung cancer on single agent Rande Lawman is here for follow-up./Reviews of the CT scan.  In the interim patient underwent pelvic surgery-noted to have cystadenoma of the right ovary.  Patient also underwent carotid endarterectomy surgery 11/09/23, Dr. Gilda Crease, Severe asymptomatic right carotid artery stenosis.   Appetite 50% normal, no supplement drinks. Has some nausea at times.  Constipation, uses an enema.   Denies any abdominal pain. Otherwise patient continues to have mild shortness of breath on exertion.  Denies any nausea vomiting. Complains of cramping in the legs chronic.   Patient is quite emotionally stressed because as her house is on sale; and she might have to move.  Review of Systems  Constitutional:  Negative for chills, diaphoresis, fever, malaise/fatigue and weight loss.  HENT:  Negative for nosebleeds and sore throat.   Eyes:  Negative for double vision.  Respiratory:  Negative for cough, hemoptysis, sputum production, shortness of breath and wheezing.   Cardiovascular:  Negative for palpitations, orthopnea and leg swelling.  Gastrointestinal:  Negative for abdominal pain, blood in stool, constipation, diarrhea, heartburn, melena, nausea and vomiting.  Genitourinary:  Negative for dysuria, frequency and urgency.  Musculoskeletal:  Positive for  back pain and joint pain.  Skin: Negative.  Negative for itching and rash.  Neurological:   Positive for tingling. Negative for dizziness, focal weakness, weakness and headaches.  Endo/Heme/Allergies:  Does not bruise/bleed easily.  Psychiatric/Behavioral:  Negative for depression. The patient is not nervous/anxious and does not have insomnia.      MEDICAL HISTORY:  Past Medical History:  Diagnosis Date   AAA (abdominal aortic aneurysm) (HCC)    a.) s/p EVAR stent graft repair 03/27/2013   Adnexal mass    Aortic atherosclerosis (HCC)    Arthritis    Asthma    Atherosclerosis of native artery of both lower extremities with intermittent claudication (HCC)    B12 deficiency    Bilateral carotid artery stenosis    a.) doppler 05/06/2020 and 06/30/2021: 1-39% BICA; b.) doppler 11/20/2022: 60-79% RICA; c.) CTA neck 08/17/2023: >75% RICA, 30% LICA   CAD (coronary artery disease) 03/16/2009   a.) LHC/PCI 03/16/2009: 25% mLCx, 70% mRCA (3 x 18 mm Xience V DES); b.) MV 10/22/2017 and 03/16/2021: no ischemia; c.) MV 07/16/2023: mild apical ischemia   Cancer of left adrenal gland (HCC) 06/20/2022   a.) CNB 06/20/2022 -> pathology (+) for CK7 (TTF-1 -) adenocarcinoma   Centrilobular emphysema (HCC)    Cervical spinal stenosis    Cervical spondylosis with radiculopathy    Chronic back pain    Colon polyp    Complex ovarian cyst    Complication of anesthesia    a.) prone to experience postoperative hypotension   COPD (chronic obstructive pulmonary disease) (HCC)    DDD (degenerative disc disease), cervical    DDD (degenerative disc disease), thoracolumbar    Depression    Diabetes mellitus type 2, controlled (HCC)    Diastolic dysfunction 06/29/2023   a.) TTE 06/29/2023: EF >55%, no RWMAs, G1DD, triv PR, mild MR/TR   Fatty liver    GERD (gastroesophageal reflux disease)    Gout    History of bilateral cataract extraction    History of kidney stones    Hypercholesteremia    Long term (current) use of aspirin    Long term current use of clopidogrel    Lumbar spinal stenosis     Lumbar spondylosis    Non-small cell lung cancer metastatic to adrenal gland (HCC)    a.) s/p LLL wedge resection 04/06/2021 --> pathology (+) for well differentiated adenocarcinoma (stage 1A) --> no adjuvant Tx; b.) recurrent stage IV adenocarcinoma 05/2022 --> Tx'd with  pembrolizumab   Osteoarthritis    Osteoporosis    Primary hypertension    Seasonal allergies    Sleep apnea    a.) no nocturnal PAP therapy since 2021   Vitamin D deficiency     SURGICAL HISTORY: Past Surgical History:  Procedure Laterality Date   ABDOMINAL AORTIC ENDOVASCULAR STENT GRAFT  03/27/2013   Dr. Levora Dredge   ABDOMINAL HYSTERECTOMY     Menometrorrhagia. Excessive bleeding. Unknown if cervix removed.    BILATERAL SALPINGOOPHORECTOMY  09/27/2023   CATARACT EXTRACTION W/PHACO Left 05/14/2023   Procedure: CATARACT EXTRACTION PHACO AND INTRAOCULAR LENS PLACEMENT (IOC) LEFT DIABETIC  OMIDRIA  19.40  01:34.8;  Surgeon: Estanislado Pandy, MD;  Location: [facility];  Service: Ophthalmology;  Laterality: Left;   CATARACT EXTRACTION W/PHACO Right 06/27/2023   Procedure: CATARACT EXTRACTION PHACO AND INTRAOCULAR LENS PLACEMENT (IOC) RIGHT DIABETIC  6.47  00:42.6;  Surgeon: Estanislado Pandy, MD;  Location: [facility];  Service: Ophthalmology;  Laterality: Right;   COLONOSCOPY WITH PROPOFOL  N/A 01/05/2021   Procedure: COLONOSCOPY WITH PROPOFOL;  Surgeon: Pasty Spillers, MD;  Location: [facility] END[facility]COPY;  Service: Endoscopy;  Laterality: N/A;   CORONARY ANGIOPLASTY WITH STENT PLACEMENT  03/16/2009   Procedure: CORONARY ANGIOPLASTY WITH STENT PLACEMENT; Location: [facility]; Surgeons: Harold Hedge, MD (diagnostic) and Lorine Bears, MD (interventional)   ENDARTERECTOMY Right 11/09/2023   Procedure: ENDARTERECTOMY CAROTID;  Surgeon: Renford Dills, MD;  Location: [facility] ORS;  Service: Vascular;  Laterality: Right;   ESOPHAGOGASTRODUODENOSCOPY (EGD) WITH PROPOFOL N/A 01/05/2021   Procedure:  ESOPHAGOGASTRODUODENOSCOPY (EGD) WITH PROPOFOL;  Surgeon: Pasty Spillers, MD;  Location: [facility] ENDOSCOPY;  Service: Endoscopy;  Laterality: N/A;   EXTRACORPOREAL SHOCK WAVE LITHOTRIPSY Right 1999   INTERCOSTAL NERVE BLOCK Left 04/06/2021   Procedure: INTERCOSTAL NERVE BLOCK;  Surgeon: Loreli Slot, MD;  Location: [facility] OR;  Service: Thoracic;  Laterality: Left;   IR IMAGING GUIDED PORT INSERTION  06/20/2022   LUNG LOBECTOMY Left 04/06/2021   robotic left lower lobectomy 04/06/2021 Dr. Dorris Fetch for Stage 1A adenocarcinoma   NODE DISSECTION Left 04/06/2021   Procedure: NODE DISSECTION;  Surgeon: Loreli Slot, MD;  Location: [facility] OR;  Service: Thoracic;  Laterality: Left;   TUBAL LIGATION      SOCIAL HISTORY: Social History   Socioeconomic History   Marital status: Divorced    Spouse name: Not on file   Number of children: 2   Years of education: H/S   Highest education level: High school graduate  Occupational History   Occupation: Disabled   Occupation: retired  Tobacco Use   Smoking status: Every Day    Current packs/day: 1.00    Average packs/day: 0.7 packs/day for 96.5 years (63.1 ttl pk-yrs)    Types: Cigarettes    Start date: 1973   Smokeless tobacco: Never   Tobacco comments:    12/23/19 states she quit for 9 months and then started back.   Vaping Use   Vaping status: Never Used  Substance and Sexual Activity   Alcohol use: Not Currently   Drug use: No   Sexual activity: Not on file  Other Topics Concern   Not on file  Social History Narrative   Hx of smoking; quit prior to lung surgery then started back. Lives in [place] alone. Used to work in [employer], [employer]- Facilities manager. On disability sec to spinal pain.    Social Drivers of Health   Financial Resource Strain: Medium Risk (02/16/2023)   Overall Financial Resource Strain (CARDIA)    Difficulty of Paying Living Expenses: Somewhat hard  Food Insecurity: No Food Insecurity (11/09/2023)   Hunger Vital  Sign    Worried About Running Out of Food in the Last Year: Never true    Ran Out of Food in the Last Year: Never true  Transportation Needs: No Transportation Needs (11/09/2023)   PRAPARE - Administrator, Civil Service (Medical): No    Lack of Transportation (Non-Medical): No  Physical Activity: Unknown (02/16/2023)   Exercise Vital Sign    Days of Exercise per Week: 0 days    Minutes of Exercise per Session: Patient declined  Stress: Stress Concern Present (02/16/2023)   [employer] of Occupational Health - Occupational Stress Questionnaire    Feeling of Stress : Rather much  Social Connections: Unknown (02/16/2023)   Social Connection and Isolation Panel [NHANES]    Frequency of Communication with Friends and Family: More than three times a week    Frequency of Social Gatherings with Friends and Family: Once  a week    Attends Religious Services: Not on file    Active Member of Clubs or Organizations: No    Attends Banker Meetings: Never    Marital Status: Patient declined  Intimate Partner Violence: Not At Risk (11/09/2023)   Humiliation, Afraid, Rape, and Kick questionnaire    Fear of Current or Ex-Partner: No    Emotionally Abused: No    Physically Abused: No    Sexually Abused: No    FAMILY HISTORY: Family History  Problem Relation Age of Onset   Hypertension Mother    Coronary artery disease Mother    Heart attack Mother        acute   Cancer Mother    Alcohol abuse Father    Depression Father    Hypertension Father    Heart attack Father 16       acute   Alcohol abuse Sister    Hyperlipidemia Sister    Hypertension Sister    Cancer Sister 42   Heart attack Sister        x's 2   Coronary artery disease Sister 40       x's 2   Breast cancer Sister 47    ALLERGIES:  is allergic to atorvastatin, omeprazole, and bupropion.  MEDICATIONS:  Current Outpatient Medications  Medication Sig Dispense Refill   acetaminophen (TYLENOL) 325  MG tablet Take 1-2 tablets (325-650 mg total) by mouth every 4 (four) hours as needed for mild pain (pain score 1-3) or moderate pain (pain score 4-6) (or temp >/= 101 F).     acetaminophen (TYLENOL) 500 MG tablet Take 500 mg by mouth every 6 (six) hours as needed for moderate pain (pain score 4-6).     allopurinol (ZYLOPRIM) 100 MG tablet Take 1 tablet (100 mg total) by mouth daily. 90 tablet 0   aspirin 81 MG tablet Take 81 mg by mouth daily.      bisoprolol (ZEBETA) 10 MG tablet Take 1 tablet (10 mg total) by mouth daily. 90 tablet 1   Budeson-Glycopyrrol-Formoterol (BREZTRI AEROSPHERE) 160-9-4.8 MCG/ACT AERO Inhale 2 puffs into the lungs in the morning and at bedtime. 1284 g 11   clopidogrel (PLAVIX) 75 MG tablet Take 1 tablet (75 mg total) by mouth daily. 90 tablet 0   ibuprofen (ADVIL) 200 MG tablet Take 200 mg by mouth 2 (two) times daily as needed (headaches).     lidocaine-prilocaine (EMLA) cream Apply on the port. 30 -45 min  prior to port access. 30 g 3   lisinopril-hydrochlorothiazide (ZESTORETIC) 20-12.5 MG tablet Take 1 tablet by mouth daily. (Patient taking differently: Take 0.5 tablets by mouth daily.) 90 tablet 0   magnesium gluconate (MAGONATE) 500 MG tablet Take 500 mg by mouth at bedtime.     oxyCODONE (OXY IR/ROXICODONE) 5 MG immediate release tablet Take 1-2 tablets (5-10 mg total) by mouth every 4 (four) hours as needed for moderate pain (pain score 4-6). 30 tablet 0   pantoprazole (PROTONIX) 20 MG tablet Take 1 tablet (20 mg total) by mouth daily. 90 tablet 0   Polyethyl Glyc-Propyl Glyc PF (SYSTANE PRESERVATIVE FREE) 0.4-0.3 % SOLN Place 1 drop into both eyes daily as needed (dry eyes).     PROAIR HFA 108 (90 Base) MCG/ACT inhaler Inhale 2 puffs into the lungs every 6 (six) hours as needed for wheezing or shortness of breath. 8.5 g 4   rosuvastatin (CRESTOR) 20 MG tablet Take 1 tablet (20 mg total) by mouth daily. 90  tablet 0   Semaglutide, 1 MG/DOSE, (OZEMPIC, 1 MG/DOSE,) 4  MG/3ML SOPN Inject 1 mg into the skin once a week. 9 mL 1   No current facility-administered medications for this visit.   Facility-Administered Medications Ordered in Other Visits  Medication Dose Route Frequency Provider Last Rate Last Admin   heparin lock flush 100 UNIT/ML injection            heparin lock flush 100 unit/mL  500 Units Intracatheter Once PRN Alinda Dooms, NP       pembrolizumab [facility]) 200 mg in sodium chloride 0.9 % 50 mL chemo infusion  200 mg Intravenous Once Alinda Dooms, NP        Mild maculopapular rash on the Maller area left more than right.  PHYSICAL EXAMINATION: ECOG PERFORMANCE STATUS: 1 - Symptomatic but completely ambulatory  Vitals:   11/20/23 0834  BP: 133/80  Pulse: 71  Temp: (!) 96.2 F (35.7 C)  SpO2: 98%      Filed Weights   11/20/23 0834  Weight: 196 lb 12.8 oz (89.3 kg)       Physical Exam HENT:     Head: Normocephalic and atraumatic.     Mouth/Throat:     Pharynx: No oropharyngeal exudate.  Eyes:     Pupils: Pupils are equal, round, and reactive to light.  Cardiovascular:     Rate and Rhythm: Normal rate and regular rhythm.  Pulmonary:     Comments: Decreased breath sounds bilaterally.  No wheeze or crackles Abdominal:     General: Bowel sounds are normal. There is no distension.     Palpations: Abdomen is soft. There is no mass.     Tenderness: There is no abdominal tenderness. There is no guarding or rebound.  Musculoskeletal:        General: No tenderness. Normal range of motion.     Cervical back: Normal range of motion and neck supple.  Skin:    General: Skin is warm.  Neurological:     Mental Status: She is alert and oriented to person, place, and time.  Psychiatric:        Mood and Affect: Affect normal.      LABORATORY DATA:  I have reviewed the data as listed Lab Results  Component Value Date   WBC 7.5 11/20/2023   HGB 13.9 11/20/2023   HCT 42.0 11/20/2023   MCV 87.0 11/20/2023   PLT 232  11/20/2023   Recent Labs    09/03/23 0824 10/22/23 0923 11/10/23 0456 11/20/23 0846  NA 139 140 139 140  K 3.7 4.1 3.9 3.8  CL 107 105 108 107  CO2 25 24 22 25   GLUCOSE 109* 97 131* 91  BUN 17 17 17 15   CREATININE 0.65 0.68 0.60 0.64  CALCIUM 9.5 9.6 9.4 9.4  GFRNONAA >60 >60 >60 >60  PROT 7.2 7.4  --  6.7  ALBUMIN 4.0 4.2  --  3.5  AST 18 20  --  20  ALT 15 16  --  15  ALKPHOS 81 112  --  78  BILITOT 0.9 0.5  --  0.6    RADIOGRAPHIC STUDIES: I have personally reviewed the radiological images as listed and agreed with the findings in the report. CT CHEST ABDOMEN PELVIS W CONTRAST Result Date: 11/10/2023 CLINICAL DATA:  Stage IV lung cancer. On immunotherapy. Prior history includes left lower lobectomy and adrenal metastasis. * Tracking Code: BO * EXAM: CT CHEST, ABDOMEN, AND PELVIS WITH CONTRAST TECHNIQUE:  Multidetector CT imaging of the chest, abdomen and pelvis was performed following the standard protocol during bolus administration of intravenous contrast. RADIATION DOSE REDUCTION: This exam was performed according to the departmental dose-optimization program which includes automated exposure control, adjustment of the mA and/or kV according to patient size and/or use of iterative reconstruction technique. CONTRAST:  OMNIPAQUE IOHEXOL 300 MG/ML  SOLN COMPARISON:  05/16/2023 FINDINGS: CT CHEST FINDINGS Cardiovascular: Right Port-A-Cath tip high right atrium. Advanced aortic and branch vessel atherosclerosis. Normal heart size, without pericardial effusion. Three vessel coronary artery calcification. No central pulmonary embolism, on this non-dedicated study. Mediastinum/Nodes: No supraclavicular adenopathy. No mediastinal or hilar adenopathy. Prevascular node or area of pleural thickening measures 5 mm on 21/2 and is similar. Lungs/Pleura: No pleural fluid. Moderate centrilobular emphysema. Left lower lobectomy. Dependent atelectasis in both lung bases. Subtle right lower lobe  subsolid nodules including at up to 1.0 cm medially on 92/4, similar. Musculoskeletal: Included within the abdomen pelvic section. CT ABDOMEN PELVIS FINDINGS Hepatobiliary: Normal liver. Normal gallbladder, without biliary ductal dilatation. Pancreas: Normal, without mass or ductal dilatation. Spleen: Normal in size, without focal abnormality. Adrenals/Urinary Tract: Normal adrenal glands. Lower pole right renal sinus cyst. Normal left kidney. No hydronephrosis. Normal urinary bladder. Stomach/Bowel: Normal stomach, without wall thickening. Scattered colonic diverticula. Colonic stool burden suggests constipation. Normal terminal ileum. Normal small bowel. Vascular/Lymphatic: Advanced aortic and branch vessel atherosclerosis. Aortic/bi-iliac stent graft. No abdominopelvic adenopathy. Reproductive: Hysterectomy. The pelvic cystic mass has been resected during interval bilateral oophorectomy. No residual or recurrent pelvic mass. Other: No significant free fluid. Moderate pelvic floor laxity. No evidence of omental or peritoneal disease. No free intraperitoneal air. Musculoskeletal: Thoracolumbar spondylosis. Convex left thoracolumbar spine curvature is mild. IMPRESSION: 1. Status post left lower lobectomy. No evidence of metastatic disease within the chest, abdomen, or pelvis. 2. Interval bilateral oophorectomy with resection of previously described dominant complex cystic pelvic mass. 3. Similar right lower lobe ground-glass densities with differential considerations of scarring or metachronous low-grade adenocarcinoma. 4. Incidental findings, including: Aortic atherosclerosis (ICD10-I70.0), coronary artery atherosclerosis and emphysema (ICD10-J43.9). Pelvic floor laxity. Possible constipation. Electronically Signed   By: Jeronimo Greaves M.D.   On: 11/10/2023 17:46     ASSESSMENT & PLAN:   Primary cancer of left lower lobe of lung (HCC) #JUNE 2023-STAGE IV--recurrent/metastatic disease with New left adrenal  metastasis;  PET scan JUNE 20th- 2023-  Enlarged 4 cm hypermetabolic LEFT adrenal metastasis;  Suspicion of metastatic adenopathy to the LEFT hilum.  Part solid nodule in the RIGHT lower lobe without metabolic activity. 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 Currently on single agent Keytruda [PD-L1 greater than 80%].    # CT CAP with contrast- DEC 12th, 2024- Status post left lower lobectomy. No evidence of recurrent or metastatic disease in the chest/abdomen/pelvis.   # Proceed of Keytruda single agent. Labs today reviewed;  acceptable for treatment today. Tolerating well except for mild chills. But will hold off further immunotherapy [chemo holiday] given recent CT scans negative for any recurrent malignancy given patient emotional state.   # CT scan-DEC 2024- Similar right lower lobe ground-glass densities with differential considerations of scarring or metachronous low-grade adenocarcinoma. Monitor for now.   # Right mildly complex cystic adnexal mass noted-June 13th, 2024- Significant interval enlargement of a multiseptated cystic lesion of the right ovary, now measuring 11.7 x 8.2 cm, previously 8.1 x 5.8 cm.- s/p  [facility], 2024]- mucinous cystadenoma - stable.  # mild Hypokalemia: discussed dietary supplement. stable  # Chronic pain: headaches/cervical pain/back pain [Dr.Naveira; pain doctor]  On NSAIDs [ monitor for now. OCT 2023- MRI brain- NEGATIVE for any metastatic disease.   Stable.  FEB 2024- MRI lumbar spine- degenerative disease- on ibuprofen prn- BID.  stable  # Left rib pain-Post throacotomy pain/tingling and numbness:  Continue gabapentin -300 mg TID; and then at extra at night prn. stable  # CAD/ PVD- cramping- s/p evaluation [Dr.Schneir]; ? Carotid occlusion- s/p CEA surgery- stable.   # COPD-stable encouraged continue to avoid smoking; again counseled to  quit smoking; s/p pulmonary; awaiting PFTs- stable  # IV Access :s/p  port placement stable  *AM appts- Monday-   # DISPOSITION:  # keytruda today; # follow up in 2 months- -MD labs/port- cbc/cmp;TSH;  NO treatment-  Dr. B  # I reviewed the blood work- with the patient in detail; also reviewed the imaging independently [as summarized above]; and with the patient in detail.        Earna Coder, MD 11/20/2023 10:25 AM

## 2023-11-26 ENCOUNTER — Encounter: Payer: Self-pay | Admitting: Family Medicine

## 2023-11-26 ENCOUNTER — Encounter (INDEPENDENT_AMBULATORY_CARE_PROVIDER_SITE_OTHER): Payer: Medicare Other

## 2023-11-26 ENCOUNTER — Ambulatory Visit (INDEPENDENT_AMBULATORY_CARE_PROVIDER_SITE_OTHER): Payer: Medicare Other | Admitting: Family Medicine

## 2023-11-26 ENCOUNTER — Ambulatory Visit (INDEPENDENT_AMBULATORY_CARE_PROVIDER_SITE_OTHER): Payer: Medicare Other | Admitting: Vascular Surgery

## 2023-11-26 VITALS — BP 126/86 | HR 73 | Resp 16 | Ht 68.0 in | Wt 197.5 lb

## 2023-11-26 DIAGNOSIS — F17210 Nicotine dependence, cigarettes, uncomplicated: Secondary | ICD-10-CM | POA: Diagnosis not present

## 2023-11-26 DIAGNOSIS — F3341 Major depressive disorder, recurrent, in partial remission: Secondary | ICD-10-CM

## 2023-11-26 DIAGNOSIS — I10 Essential (primary) hypertension: Secondary | ICD-10-CM | POA: Diagnosis not present

## 2023-11-26 DIAGNOSIS — Z7985 Long-term (current) use of injectable non-insulin antidiabetic drugs: Secondary | ICD-10-CM

## 2023-11-26 DIAGNOSIS — Z9889 Other specified postprocedural states: Secondary | ICD-10-CM

## 2023-11-26 DIAGNOSIS — E119 Type 2 diabetes mellitus without complications: Secondary | ICD-10-CM

## 2023-11-26 DIAGNOSIS — I6521 Occlusion and stenosis of right carotid artery: Secondary | ICD-10-CM

## 2023-11-26 DIAGNOSIS — E559 Vitamin D deficiency, unspecified: Secondary | ICD-10-CM

## 2023-11-26 DIAGNOSIS — E1169 Type 2 diabetes mellitus with other specified complication: Secondary | ICD-10-CM

## 2023-11-26 MED ORDER — BUPROPION HCL ER (XL) 150 MG PO TB24: 150.0000 mg | ORAL_TABLET | Freq: Every day | ORAL | 3 refills | Status: DC

## 2023-11-26 NOTE — Patient Instructions (Signed)
.   Please review the attached list of medications and notify my office if there are any errors.   . Please bring all of your medications to every appointment so we can make sure that our medication list is the same as yours.   

## 2023-11-27 ENCOUNTER — Encounter (INDEPENDENT_AMBULATORY_CARE_PROVIDER_SITE_OTHER): Payer: Medicare Other

## 2023-11-27 ENCOUNTER — Ambulatory Visit (INDEPENDENT_AMBULATORY_CARE_PROVIDER_SITE_OTHER): Payer: Medicare Other | Admitting: Nurse Practitioner

## 2023-11-27 NOTE — Progress Notes (Signed)
Established patient visit   Patient: Laura Nielsen [facility]   DOB: 02/17/1956   67 y.o. Female  MRN: [id number] Visit Date: 11/26/2023  Today's healthcare provider: Mila Merry, MD   Chief Complaint  Patient presents with   Medical Management of Chronic Issues    3 months follow-up  to reassess blood pressure, blood sugar control   Subjective    Discussed the use of AI scribe software for clinical note transcription with the patient, who gave verbal consent to proceed.  History of Present Illness   The patient, with a history of carotid artery disease, diabetes, and lung cancer presents for routine follow up. She recently underwent carotid surgery. She reports that the procedure went well, but the recovery was challenging. The surgical site appears to be healing well. She also reports that her blood pressure was a concern during her hospital stay, with difficulty maintaining the diastolic pressure above 95. She was instructed to monitor her blood pressure at home.  The patient's diabetes appears to be well-controlled, with a HbA1c of 5.4 prior to CEA earlier this month. She reports being taken off her Ozempic and blood thinners prior to surgery but has since resumed these medications.  The patient has been experiencing significant stress due to housing uncertainty, which has led to an increase in smoking. She expresses a desire to quit smoking but acknowledges that her current stress level is a barrier. She has previously tried Wellbutrin to aid in smoking cessation, but it caused constant nausea at normal dose. She is considering restarting a lower dose of Wellbutrin to help manage her mood and emotional stress, as she had taken the 150 XL formulation without side effects, although it didn't seem to help her cigarette cravings.       Medications: Outpatient Medications Prior to Visit  Medication Sig   acetaminophen (TYLENOL) 325 MG tablet Take 1-2 tablets (325-650 mg total) by  mouth every 4 (four) hours as needed for mild pain (pain score 1-3) or moderate pain (pain score 4-6) (or temp >/= 101 F).   acetaminophen (TYLENOL) 500 MG tablet Take 500 mg by mouth every 6 (six) hours as needed for moderate pain (pain score 4-6).   allopurinol (ZYLOPRIM) 100 MG tablet Take 1 tablet (100 mg total) by mouth daily.   aspirin 81 MG tablet Take 81 mg by mouth daily.    bisoprolol (ZEBETA) 10 MG tablet Take 1 tablet (10 mg total) by mouth daily.   Budeson-Glycopyrrol-Formoterol (BREZTRI AEROSPHERE) 160-9-4.8 MCG/ACT AERO Inhale 2 puffs into the lungs in the morning and at bedtime.   clopidogrel (PLAVIX) 75 MG tablet Take 1 tablet (75 mg total) by mouth daily.   ibuprofen (ADVIL) 200 MG tablet Take 200 mg by mouth 2 (two) times daily as needed (headaches).   lidocaine-prilocaine (EMLA) cream Apply on the port. 30 -45 min  prior to port access.   lisinopril-hydrochlorothiazide (ZESTORETIC) 20-12.5 MG tablet Take 1 tablet by mouth daily. (Patient taking differently: Take 0.5 tablets by mouth daily.)   magnesium gluconate (MAGONATE) 500 MG tablet Take 500 mg by mouth at bedtime.   oxyCODONE (OXY IR/ROXICODONE) 5 MG immediate release tablet Take 1-2 tablets (5-10 mg total) by mouth every 4 (four) hours as needed for moderate pain (pain score 4-6).   pantoprazole (PROTONIX) 20 MG tablet Take 1 tablet (20 mg total) by mouth daily.   Polyethyl Glyc-Propyl Glyc PF (SYSTANE PRESERVATIVE FREE) 0.4-0.3 % SOLN Place 1 drop into both eyes daily as  needed (dry eyes).   PROAIR HFA 108 (90 Base) MCG/ACT inhaler Inhale 2 puffs into the lungs every 6 (six) hours as needed for wheezing or shortness of breath.   rosuvastatin (CRESTOR) 20 MG tablet Take 1 tablet (20 mg total) by mouth daily.   Semaglutide, 1 MG/DOSE, (OZEMPIC, 1 MG/DOSE,) 4 MG/3ML SOPN Inject 1 mg into the skin once a week.   Facility-Administered Medications Prior to Visit  Medication Dose Route Frequency Provider   heparin lock flush  100 UNIT/ML injection           Objective    BP 126/86 (BP Location: Left Arm, Patient Position: Sitting, Cuff Size: Normal)   Pulse 73   Resp 16   Ht 5\' 8"  (1.727 m)   Wt 197 lb 8 oz (89.6 kg)   SpO2 99%   BMI 30.03 kg/m    Physical Exam   NECK: Carotid surgery site healing well, no signs of infection or complications observed. CHEST: Lungs clear to auscultation. CARDIOVASCULAR: Heart sounds normal on auscultation. EXTREMITIES: No swelling in ankles or feet.    No results found for any visits on 11/26/23.  Assessment & Plan        Post Carotid Surgery Healing well since surgery in January. No complications reported. -Continue current care and medications.  Hypertension Blood pressure well controlled at current visit. Patient back on her regular medications regiment. -Continue current medication regimen.  Diabetes A1c at 5.4, indicating good control. Patient back on Ozempic after surgery. -Continue Ozempic and monitor blood glucose levels.  Tobacco Use Patient expresses desire to quit smoking but is currently under significant stress. Previous use of high dose Wellbutrin caused nausea. -Start Wellbutrin 150mg  once daily (slow release) to aid in smoking cessation and manage depression and stress.  Follow-up Patient has a wellness visit scheduled for March 19th. -Schedule additional appointment around the same time to follow up on medications and overall health.    Return in about 3 months (around 02/24/2024) for Hypertension.      Mila Merry, MD  [facility] [phone] (phone) [phone] (fax)  [facility]

## 2023-11-28 ENCOUNTER — Other Ambulatory Visit: Payer: Self-pay

## 2023-12-06 ENCOUNTER — Telehealth: Payer: Self-pay

## 2023-12-06 ENCOUNTER — Other Ambulatory Visit: Payer: Self-pay | Admitting: Family Medicine

## 2023-12-06 ENCOUNTER — Telehealth: Payer: Self-pay | Admitting: *Deleted

## 2023-12-06 ENCOUNTER — Other Ambulatory Visit (INDEPENDENT_AMBULATORY_CARE_PROVIDER_SITE_OTHER): Payer: Self-pay | Admitting: Vascular Surgery

## 2023-12-06 DIAGNOSIS — I6523 Occlusion and stenosis of bilateral carotid arteries: Secondary | ICD-10-CM

## 2023-12-06 DIAGNOSIS — E7800 Pure hypercholesterolemia, unspecified: Secondary | ICD-10-CM

## 2023-12-06 NOTE — Telephone Encounter (Signed)
 Patient called reporting that she has 30 days to move as the place she was renting has been sold. She is on need of a letter stating that her cat is her emotional support animal and that she has cancer and undergoing treatment. Please call her when ready  (she is asking this to be written ASAP and would like to pick it up on Monday ) I told her that he is off this week and she is asking if the NP can sign it,

## 2023-12-06 NOTE — Telephone Encounter (Signed)
 Copied from CRM [id number]. Topic: General - Other >> Dec 06, 2023  9:50 AM Tiffany B wrote: Patient would like PCP to write a letter reflecting that her cat is her emotional support animal. Caller states cat has never been outside unless to go to the vet. The letter can be to whom you may concern. Patient would like to pick up the letter on Monday, 12/14/2023 in the  afternoon. Please call when completed.

## 2023-12-07 ENCOUNTER — Encounter: Payer: Self-pay | Admitting: *Deleted

## 2023-12-09 NOTE — Progress Notes (Signed)
 Patient ID: Laura Nielsen, female   DOB: Jun 28, 1956, 68 y.o.   MRN: [id number]  No chief complaint on file.   HPI Laura Nielsen is a 68 y.o. female.    The patient is seen for follow up evaluation of carotid stenosis status post right carotid endarterectomy on 11/09/2023.  There were no post operative problems or complications related to the surgery.  The patient denies neck or incisional pain.  The patient denies interval amaurosis fugax. There is no recent history of TIA symptoms or focal motor deficits. There is no prior documented CVA.  The patient denies headache.  The patient is taking enteric-coated aspirin  81 mg daily.  No recent shortening of the patient's walking distance or new symptoms consistent with claudication.  No history of rest pain symptoms. No new ulcers or wounds of the lower extremities have occurred.  There is no history of DVT, PE or superficial thrombophlebitis. No recent episodes of angina or shortness of breath documented.    Past Medical History:  Diagnosis Date   AAA (abdominal aortic aneurysm) (HCC)    a.) s/p EVAR stent graft repair 03/27/2013   Adnexal mass    Aortic atherosclerosis (HCC)    Arthritis    Asthma    Atherosclerosis of native artery of both lower extremities with intermittent claudication (HCC)    B12 deficiency    Bilateral carotid artery stenosis    a.) doppler 05/06/2020 and 06/30/2021: 1-39% BICA; b.) doppler 11/20/2022: 60-79% RICA; c.) CTA neck 08/17/2023: >75% RICA, 30% LICA   CAD (coronary artery disease) 03/16/2009   a.) LHC/PCI 03/16/2009: 25% mLCx, 70% mRCA (3 x 18 mm Xience V DES); b.) MV 10/22/2017 and 03/16/2021: no ischemia; c.) MV 07/16/2023: mild apical ischemia   Cancer of left adrenal gland (HCC) 06/20/2022   a.) CNB 06/20/2022 -> pathology (+) for CK7 (TTF-1 -) adenocarcinoma   Centrilobular emphysema (HCC)    Cervical spinal stenosis    Cervical spondylosis with radiculopathy    Chronic back  pain    Colon polyp    Complex ovarian cyst    Complication of anesthesia    a.) prone to experience postoperative hypotension   COPD (chronic obstructive pulmonary disease) (HCC)    DDD (degenerative disc disease), cervical    DDD (degenerative disc disease), thoracolumbar    Depression    Diabetes mellitus type 2, controlled (HCC)    Diastolic dysfunction 06/29/2023   a.) TTE 06/29/2023: EF >55%, no RWMAs, G1DD, triv PR, mild MR/TR   Fatty liver    GERD (gastroesophageal reflux disease)    Gout    History of bilateral cataract extraction    History of kidney stones    Hypercholesteremia    Long term (current) use of aspirin     Long term current use of clopidogrel     Lumbar spinal stenosis    Lumbar spondylosis    Non-small cell lung cancer metastatic to adrenal gland (HCC)    a.) s/p LLL wedge resection 04/06/2021 --> pathology (+) for well differentiated adenocarcinoma (stage 1A) --> no adjuvant Tx; b.) recurrent stage IV adenocarcinoma 05/2022 --> Tx'd with  pembrolizumab    Osteoarthritis    Osteoporosis    Primary hypertension    Seasonal allergies    Sleep apnea    a.) no nocturnal PAP therapy since 2021   Vitamin D  deficiency     Past Surgical History:  Procedure Laterality Date   ABDOMINAL AORTIC ENDOVASCULAR STENT GRAFT  03/27/2013   Dr. Cordella  Allice Garro   ABDOMINAL HYSTERECTOMY     Menometrorrhagia. Excessive bleeding. Unknown if cervix removed.    BILATERAL SALPINGOOPHORECTOMY  09/27/2023   CATARACT EXTRACTION W/PHACO Left 05/14/2023   Procedure: CATARACT EXTRACTION PHACO AND INTRAOCULAR LENS PLACEMENT (IOC) LEFT DIABETIC  OMIDRIA   19.40  01:34.8;  Surgeon: Enola Feliciano Hugger, MD;  Location: [facility];  Service: Ophthalmology;  Laterality: Left;   CATARACT EXTRACTION W/PHACO Right 06/27/2023   Procedure: CATARACT EXTRACTION PHACO AND INTRAOCULAR LENS PLACEMENT (IOC) RIGHT DIABETIC  6.47  00:42.6;  Surgeon: Enola Feliciano Hugger, MD;  Location:  [facility];  Service: Ophthalmology;  Laterality: Right;   COLONOSCOPY WITH PROPOFOL  N/A 01/05/2021   Procedure: COLONOSCOPY WITH PROPOFOL ;  Surgeon: Janalyn Keene NOVAK, MD;  Location: [facility];  Service: Endoscopy;  Laterality: N/A;   CORONARY ANGIOPLASTY WITH STENT PLACEMENT  03/16/2009   Procedure: CORONARY ANGIOPLASTY WITH STENT PLACEMENT; Location: [facility]; Surgeons: Vinie Jude, MD (diagnostic) and Deatrice Cage, MD (interventional)   ENDARTERECTOMY Right 11/09/2023   Procedure: ENDARTERECTOMY CAROTID;  Surgeon: Jama Cordella MATSU, MD;  Location: [facility];  Service: Vascular;  Laterality: Right;   ESOPHAGOGASTRODUODENOSCOPY (EGD) WITH PROPOFOL  N/A 01/05/2021   Procedure: ESOPHAGOGASTRODUODENOSCOPY (EGD) WITH PROPOFOL ;  Surgeon: Janalyn Keene NOVAK, MD;  Location: [facility];  Service: Endoscopy;  Laterality: N/A;   EXTRACORPOREAL SHOCK WAVE LITHOTRIPSY Right 1999   INTERCOSTAL NERVE BLOCK Left 04/06/2021   Procedure: INTERCOSTAL NERVE BLOCK;  Surgeon: Kerrin Elspeth BROCKS, MD;  Location: [facility] OR;  Service: Thoracic;  Laterality: Left;   IR IMAGING GUIDED PORT INSERTION  06/20/2022   LUNG LOBECTOMY Left 04/06/2021   robotic left lower lobectomy 04/06/2021 Dr. Kerrin for Stage 1A adenocarcinoma   NODE DISSECTION Left 04/06/2021   Procedure: NODE DISSECTION;  Surgeon: Kerrin Elspeth BROCKS, MD;  Location: [facility] OR;  Service: Thoracic;  Laterality: Left;   TUBAL LIGATION        Allergies  Allergen Reactions   Atorvastatin Other (See Comments)    Elevated blood sugar    Omeprazole Nausea And Vomiting   Bupropion  Nausea Only    Only on the 150mg  tablets, but the 100mg  didn't help with smoking    Current Outpatient Medications  Medication Sig Dispense Refill   acetaminophen  (TYLENOL ) 325 MG tablet Take 1-2 tablets (325-650 mg total) by mouth every 4 (four) hours as needed for mild pain (pain score 1-3) or moderate pain (pain score 4-6) (or temp >/= 101 F).      acetaminophen  (TYLENOL ) 500 MG tablet Take 500 mg by mouth every 6 (six) hours as needed for moderate pain (pain score 4-6).     allopurinol  (ZYLOPRIM ) 100 MG tablet Take 1 tablet (100 mg total) by mouth daily. 90 tablet 4   aspirin  81 MG tablet Take 81 mg by mouth daily.      bisoprolol  (ZEBETA ) 10 MG tablet Take 1 tablet (10 mg total) by mouth daily. 90 tablet 1   Budeson-Glycopyrrol-Formoterol  (BREZTRI  AEROSPHERE) 160-9-4.8 MCG/ACT AERO Inhale 2 puffs into the lungs in the morning and at bedtime. 1284 g 11   buPROPion  (WELLBUTRIN  XL) 150 MG 24 hr tablet Take 1 tablet (150 mg total) by mouth daily. 30 tablet 3   clopidogrel  (PLAVIX ) 75 MG tablet Take 1 tablet (75 mg total) by mouth daily. 90 tablet 0   ibuprofen (ADVIL) 200 MG tablet Take 200 mg by mouth 2 (two) times daily as needed (headaches).     lidocaine -prilocaine  (EMLA ) cream Apply on the port. 30 -45 min  prior to  port access. 30 g 3   lisinopril -hydrochlorothiazide  (ZESTORETIC ) 20-12.5 MG tablet Take 1 tablet by mouth daily. (Patient taking differently: Take 0.5 tablets by mouth daily.) 90 tablet 0   magnesium gluconate (MAGONATE) 500 MG tablet Take 500 mg by mouth at bedtime.     oxyCODONE  (OXY IR/ROXICODONE ) 5 MG immediate release tablet Take 1-2 tablets (5-10 mg total) by mouth every 4 (four) hours as needed for moderate pain (pain score 4-6). 30 tablet 0   pantoprazole  (PROTONIX ) 20 MG tablet Take 1 tablet (20 mg total) by mouth daily. 90 tablet 0   Polyethyl Glyc-Propyl Glyc PF (SYSTANE PRESERVATIVE FREE) 0.4-0.3 % SOLN Place 1 drop into both eyes daily as needed (dry eyes).     PROAIR  HFA 108 (90 Base) MCG/ACT inhaler Inhale 2 puffs into the lungs every 6 (six) hours as needed for wheezing or shortness of breath. 8.5 g 4   rosuvastatin  (CRESTOR ) 20 MG tablet Take 1 tablet (20 mg total) by mouth daily. 90 tablet 4   Semaglutide , 1 MG/DOSE, (OZEMPIC , 1 MG/DOSE,) 4 MG/3ML SOPN Inject 1 mg into the skin once a week. 9 mL 1   No  current facility-administered medications for this visit.   Facility-Administered Medications Ordered in Other Visits  Medication Dose Route Frequency Provider Last Rate Last Admin   heparin  lock flush 100 UNIT/ML injection                 Physical Exam There were no vitals taken for this visit. Gen:  WD/WN, NAD Skin: incision C/D/I Neuro: intact    Assessment/Plan: 1. Bilateral carotid artery stenosis (Primary) Recommend:  The patient is s/p successful right CEA  Duplex ultrasound  shows 1-39%  stenosis.  Continue antiplatelet therapy as prescribed Continue management of CAD, HTN and Hyperlipidemia Healthy heart diet,  encouraged exercise at least 4 times per week  The patient's NIHSS score is as follows: 1 Mild: 1 - 5 Mild to Moderately Severe: 5 - 14 Severe: 15 - 24 Very Severe: >25  Follow up in 6 months with duplex ultrasound and physical exam based on the patient's carotid surgery and <50% stenosis of the left carotid artery  - VAS US  CAROTID; Future      Cordella Shawl 12/09/2023, 2:42 PM   This note was created with Dragon medical transcription system.  Any errors from dictation are unintentional.

## 2023-12-10 ENCOUNTER — Ambulatory Visit (INDEPENDENT_AMBULATORY_CARE_PROVIDER_SITE_OTHER): Payer: Medicare Other | Admitting: Vascular Surgery

## 2023-12-10 ENCOUNTER — Encounter (INDEPENDENT_AMBULATORY_CARE_PROVIDER_SITE_OTHER): Payer: Self-pay | Admitting: Vascular Surgery

## 2023-12-10 ENCOUNTER — Ambulatory Visit (INDEPENDENT_AMBULATORY_CARE_PROVIDER_SITE_OTHER): Payer: Medicare Other

## 2023-12-10 VITALS — BP 121/86 | HR 76 | Resp 18 | Ht 68.0 in | Wt 192.6 lb

## 2023-12-10 DIAGNOSIS — I6523 Occlusion and stenosis of bilateral carotid arteries: Secondary | ICD-10-CM | POA: Diagnosis not present

## 2023-12-11 ENCOUNTER — Other Ambulatory Visit: Payer: Self-pay

## 2023-12-24 ENCOUNTER — Ambulatory Visit: Payer: Medicare Other | Admitting: Student in an Organized Health Care Education/Training Program

## 2024-01-07 ENCOUNTER — Other Ambulatory Visit (HOSPITAL_COMMUNITY): Payer: Self-pay

## 2024-01-07 ENCOUNTER — Telehealth: Payer: Self-pay

## 2024-01-07 ENCOUNTER — Encounter: Payer: Self-pay | Admitting: Internal Medicine

## 2024-01-07 NOTE — Telephone Encounter (Signed)
Pharmacy Patient Advocate Encounter  Received notification from [employer] that Prior Authorization for Ozempic 1mg /dose (4mg /43ml) pen has been APPROVED from 01/07/24 to 01/06/25. Ran test claim, Copay is $60. This test claim was processed through [facility]- copay amounts may vary at other pharmacies due to pharmacy/plan contracts, or as the patient moves through the different stages of their insurance plan.   PA #/Case ID/Reference #: [id number]

## 2024-01-10 ENCOUNTER — Telehealth: Payer: Self-pay | Admitting: Family Medicine

## 2024-01-10 NOTE — Telephone Encounter (Signed)
 [facility] is requesting prior authorization Key: [id number] Ozempic  1mg /Dose 4mg /3ML  pen injectors

## 2024-01-15 NOTE — Telephone Encounter (Signed)
PA form has been started for patient- OZEMPIC (1MG /Dose)  KEY: [id number]

## 2024-01-16 ENCOUNTER — Other Ambulatory Visit (HOSPITAL_COMMUNITY): Payer: Self-pay

## 2024-01-17 ENCOUNTER — Ambulatory Visit: Payer: Medicare Other | Admitting: Student in an Organized Health Care Education/Training Program

## 2024-01-17 ENCOUNTER — Encounter: Payer: Self-pay | Admitting: Internal Medicine

## 2024-01-17 ENCOUNTER — Encounter: Payer: Self-pay | Admitting: Student in an Organized Health Care Education/Training Program

## 2024-01-17 VITALS — BP 108/68 | HR 79 | Temp 97.8°F | Resp 16 | Ht 68.0 in | Wt 187.2 lb

## 2024-01-17 DIAGNOSIS — J432 Centrilobular emphysema: Secondary | ICD-10-CM

## 2024-01-17 DIAGNOSIS — C3432 Malignant neoplasm of lower lobe, left bronchus or lung: Secondary | ICD-10-CM

## 2024-01-17 DIAGNOSIS — J449 Chronic obstructive pulmonary disease, unspecified: Secondary | ICD-10-CM | POA: Diagnosis not present

## 2024-01-17 DIAGNOSIS — F17210 Nicotine dependence, cigarettes, uncomplicated: Secondary | ICD-10-CM | POA: Diagnosis not present

## 2024-01-17 DIAGNOSIS — R0602 Shortness of breath: Secondary | ICD-10-CM

## 2024-01-17 NOTE — Progress Notes (Unsigned)
Synopsis: Referred in *** by Malva Limes, MD  Assessment & Plan:   1. Centrilobular emphysema (HCC) (Primary)  Has an occasional wheeze. Compliant with breztri. No issues otherwise, improved symptoms. Continue.  2. Shortness of breath ***  3. Cigarette nicotine dependence without complication  Continues to smoke, but intends to quit. Initiated on wellbutrin by PCP, contemplating. Counseled on smoking cessation. 3 minutes  4. Primary cancer of left lower lobe of lung (HCC)  On pembrolizumab, followed by Dr. Leonard Schwartz. Reviewed imaging, no sign of recurrence.   Return in about 6 months (around 07/16/2024).  I spent *** minutes caring for this patient today, including {EM billing:[id number]}  Raechel Chute, MD [facility] 01/17/2024 9:17 AM    End of visit medications:  No orders of the defined types were placed in this encounter.    Current Outpatient Medications:    acetaminophen (TYLENOL) 325 MG tablet, Take 1-2 tablets (325-650 mg total) by mouth every 4 (four) hours as needed for mild pain (pain score 1-3) or moderate pain (pain score 4-6) (or temp >/= 101 F)., Disp: , Rfl:    acetaminophen (TYLENOL) 500 MG tablet, Take 500 mg by mouth every 6 (six) hours as needed for moderate pain (pain score 4-6)., Disp: , Rfl:    allopurinol (ZYLOPRIM) 100 MG tablet, Take 1 tablet (100 mg total) by mouth daily., Disp: 90 tablet, Rfl: 4   aspirin 81 MG tablet, Take 81 mg by mouth daily. , Disp: , Rfl:    bisoprolol (ZEBETA) 10 MG tablet, Take 1 tablet (10 mg total) by mouth daily., Disp: 90 tablet, Rfl: 1   Budeson-Glycopyrrol-Formoterol (BREZTRI AEROSPHERE) 160-9-4.8 MCG/ACT AERO, Inhale 2 puffs into the lungs in the morning and at bedtime., Disp: 1284 g, Rfl: 11   buPROPion (WELLBUTRIN XL) 150 MG 24 hr tablet, Take 1 tablet (150 mg total) by mouth daily., Disp: 30 tablet, Rfl: 3   clopidogrel (PLAVIX) 75 MG tablet, Take 1 tablet (75 mg total) by mouth daily., Disp: 90  tablet, Rfl: 0   ibuprofen (ADVIL) 200 MG tablet, Take 200 mg by mouth 2 (two) times daily as needed (headaches)., Disp: , Rfl:    lidocaine-prilocaine (EMLA) cream, Apply on the port. 30 -45 min  prior to port access., Disp: 30 g, Rfl: 3   lisinopril-hydrochlorothiazide (ZESTORETIC) 20-12.5 MG tablet, Take 1 tablet by mouth daily. (Patient taking differently: Take 0.5 tablets by mouth daily.), Disp: 90 tablet, Rfl: 0   magnesium gluconate (MAGONATE) 500 MG tablet, Take 500 mg by mouth at bedtime., Disp: , Rfl:    oxyCODONE (OXY IR/ROXICODONE) 5 MG immediate release tablet, Take 1-2 tablets (5-10 mg total) by mouth every 4 (four) hours as needed for moderate pain (pain score 4-6)., Disp: 30 tablet, Rfl: 0   pantoprazole (PROTONIX) 20 MG tablet, Take 1 tablet (20 mg total) by mouth daily., Disp: 90 tablet, Rfl: 0   Polyethyl Glyc-Propyl Glyc PF (SYSTANE PRESERVATIVE FREE) 0.4-0.3 % SOLN, Place 1 drop into both eyes daily as needed (dry eyes)., Disp: , Rfl:    PROAIR HFA 108 (90 Base) MCG/ACT inhaler, Inhale 2 puffs into the lungs every 6 (six) hours as needed for wheezing or shortness of breath., Disp: 8.5 g, Rfl: 4   rosuvastatin (CRESTOR) 20 MG tablet, Take 1 tablet (20 mg total) by mouth daily., Disp: 90 tablet, Rfl: 4   Semaglutide, 1 MG/DOSE, (OZEMPIC, 1 MG/DOSE,) 4 MG/3ML SOPN, Inject 1 mg into the skin once a week., Disp: 9  mL, Rfl: 1 No current facility-administered medications for this visit.  Facility-Administered Medications Ordered in Other Visits:    heparin lock flush 100 UNIT/ML injection, , , ,    Subjective:   PATIENT ID: Laura Nielsen GENDER: female DOB: [birth year], MRN: [id number]  Chief Complaint  Patient presents with   Follow-up    HPI ***  Ancillary information including prior medications, full medical/surgical/family/social histories, and PFTs (when available) are listed below and have been reviewed.   ROS   Objective:   Vitals:   01/17/24 0840  BP:  108/68  Pulse: 79  Resp: 16  Temp: 97.8 F (36.6 C)  TempSrc: Temporal  SpO2: 99%  Weight: 187 lb 3.2 oz (84.9 kg)  Height: 5\' 8"  (1.727 m)   99% on *** LPM *** RA BMI Readings from Last 3 Encounters:  01/17/24 28.46 kg/m  12/10/23 29.28 kg/m  11/26/23 30.03 kg/m   Wt Readings from Last 3 Encounters:  01/17/24 187 lb 3.2 oz (84.9 kg)  12/10/23 192 lb 9.6 oz (87.4 kg)  11/26/23 197 lb 8 oz (89.6 kg)    Physical Exam    Ancillary Information    Past Medical History:  Diagnosis Date   AAA (abdominal aortic aneurysm) (HCC)    a.) s/p EVAR stent graft repair 03/27/2013   Adnexal mass    Aortic atherosclerosis (HCC)    Arthritis    Asthma    Atherosclerosis of native artery of both lower extremities with intermittent claudication (HCC)    B12 deficiency    Bilateral carotid artery stenosis    a.) doppler 05/06/2020 and 06/30/2021: 1-39% BICA; b.) doppler 11/20/2022: 60-79% RICA; c.) CTA neck 08/17/2023: >75% RICA, 30% LICA   CAD (coronary artery disease) 03/16/2009   a.) LHC/PCI 03/16/2009: 25% mLCx, 70% mRCA (3 x 18 mm Xience V DES); b.) MV 10/22/2017 and 03/16/2021: no ischemia; c.) MV 07/16/2023: mild apical ischemia   Cancer of left adrenal gland (HCC) 06/20/2022   a.) CNB 06/20/2022 -> pathology (+) for CK7 (TTF-1 -) adenocarcinoma   Centrilobular emphysema (HCC)    Cervical spinal stenosis    Cervical spondylosis with radiculopathy    Chronic back pain    Colon polyp    Complex ovarian cyst    Complication of anesthesia    a.) prone to experience postoperative hypotension   COPD (chronic obstructive pulmonary disease) (HCC)    DDD (degenerative disc disease), cervical    DDD (degenerative disc disease), thoracolumbar    Depression    Diabetes mellitus type 2, controlled (HCC)    Diastolic dysfunction 06/29/2023   a.) TTE 06/29/2023: EF >55%, no RWMAs, G1DD, triv PR, mild MR/TR   Fatty liver    GERD (gastroesophageal reflux disease)    Gout    History  of bilateral cataract extraction    History of kidney stones    Hypercholesteremia    Long term (current) use of aspirin    Long term current use of clopidogrel    Lumbar spinal stenosis    Lumbar spondylosis    Non-small cell lung cancer metastatic to adrenal gland (HCC)    a.) s/p LLL wedge resection 04/06/2021 --> pathology (+) for well differentiated adenocarcinoma (stage 1A) --> no adjuvant Tx; b.) recurrent stage IV adenocarcinoma 05/2022 --> Tx'd with  pembrolizumab   Osteoarthritis    Osteoporosis    Primary hypertension    Seasonal allergies    Sleep apnea    a.) no nocturnal PAP therapy since 2021  Vitamin D deficiency      Family History  Problem Relation Age of Onset   Hypertension Mother    Coronary artery disease Mother    Heart attack Mother        acute   Cancer Mother    Alcohol abuse Father    Depression Father    Hypertension Father    Heart attack Father 51       acute   Alcohol abuse Sister    Hyperlipidemia Sister    Hypertension Sister    Cancer Sister 33   Heart attack Sister        x's 2   Coronary artery disease Sister 34       x's 2   Breast cancer Sister 39     Past Surgical History:  Procedure Laterality Date   ABDOMINAL AORTIC ENDOVASCULAR STENT GRAFT  03/27/2013   Dr. Levora Dredge   ABDOMINAL HYSTERECTOMY     Menometrorrhagia. Excessive bleeding. Unknown if cervix removed.    BILATERAL SALPINGOOPHORECTOMY  09/27/2023   CATARACT EXTRACTION W/PHACO Left 05/14/2023   Procedure: CATARACT EXTRACTION PHACO AND INTRAOCULAR LENS PLACEMENT (IOC) LEFT DIABETIC  OMIDRIA  19.40  01:34.8;  Surgeon: Estanislado Pandy, MD;  Location: [facility];  Service: Ophthalmology;  Laterality: Left;   CATARACT EXTRACTION W/PHACO Right 06/27/2023   Procedure: CATARACT EXTRACTION PHACO AND INTRAOCULAR LENS PLACEMENT (IOC) RIGHT DIABETIC  6.47  00:42.6;  Surgeon: Estanislado Pandy, MD;  Location: [facility];  Service: Ophthalmology;   Laterality: Right;   COLONOSCOPY WITH PROPOFOL N/A 01/05/2021   Procedure: COLONOSCOPY WITH PROPOFOL;  Surgeon: Pasty Spillers, MD;  Location: [facility];  Service: Endoscopy;  Laterality: N/A;   CORONARY ANGIOPLASTY WITH STENT PLACEMENT  03/16/2009   Procedure: CORONARY ANGIOPLASTY WITH STENT PLACEMENT; Location: [facility]; Surgeons: Harold Hedge, MD (diagnostic) and Lorine Bears, MD (interventional)   ENDARTERECTOMY Right 11/09/2023   Procedure: ENDARTERECTOMY CAROTID;  Surgeon: Renford Dills, MD;  Location: [facility] OR[facility];  Service: Vascular;  Laterality: Right;   ESOPHAGOGASTRODUODENOSCOPY (EGD) WITH PROPOFOL N/A 01/05/2021   Procedure: ESOPHAGOGASTRODUODENOSCOPY (EGD) WITH PROPOFOL;  Surgeon: Pasty Spillers, MD;  Location: [facility];  Service: Endoscopy;  Laterality: N/A;   EXTRACORPOREAL SHOCK WAVE LITHOTRIPSY Right 1999   INTERCOSTAL NERVE BLOCK Left 04/06/2021   Procedure: INTERCOSTAL NERVE BLOCK;  Surgeon: Loreli Slot, MD;  Location: [facility] OR;  Service: Thoracic;  Laterality: Left;   IR IMAGING GUIDED PORT INSERTION  06/20/2022   LUNG LOBECTOMY Left 04/06/2021   robotic left lower lobectomy 04/06/2021 Dr. Dorris Fetch for Stage 1A adenocarcinoma   NODE DISSECTION Left 04/06/2021   Procedure: NODE DISSECTION;  Surgeon: Loreli Slot, MD;  Location: [facility] OR;  Service: Thoracic;  Laterality: Left;   TUBAL LIGATION      Social History   Socioeconomic History   Marital status: Divorced    Spouse name: Not on file   Number of children: 2   Years of education: H/S   Highest education level: 12th grade  Occupational History   Occupation: Disabled   Occupation: retired  Tobacco Use   Smoking status: Every Day    Current packs/day: 1.00    Average packs/day: 0.7 packs/day for 96.6 years (63.2 ttl pk-yrs)    Types: Cigarettes    Start date: 1973   Smokeless tobacco: Never   Tobacco comments:    12/23/19 states she quit for 9 months and then started back.    Vaping Use   Vaping status: Never  Used  Substance and Sexual Activity   Alcohol use: Not Currently   Drug use: No   Sexual activity: Not on file  Other Topics Concern   Not on file  Social History Narrative   Hx of smoking; quit prior to lung surgery then started back. Lives in [place] alone. Used to work in [employer], [employer]- Facilities manager. On disability sec to spinal pain.    Social Drivers of Health   Financial Resource Strain: Medium Risk (11/26/2023)   Overall Financial Resource Strain (CARDIA)    Difficulty of Paying Living Expenses: Somewhat hard  Food Insecurity: No Food Insecurity (11/22/2023)   Hunger Vital Sign    Worried About Running Out of Food in the Last Year: Never true    Ran Out of Food in the Last Year: Never true  Transportation Needs: No Transportation Needs (11/22/2023)   PRAPARE - Administrator, Civil Service (Medical): No    Lack of Transportation (Non-Medical): No  Physical Activity: Unknown (11/26/2023)   Exercise Vital Sign    Days of Exercise per Week: 0 days    Minutes of Exercise per Session: Patient declined  Stress: Stress Concern Present (11/26/2023)   [employer] of Occupational Health - Occupational Stress Questionnaire    Feeling of Stress : Very much  Social Connections: Socially Isolated (11/26/2023)   Social Connection and Isolation Panel [NHANES]    Frequency of Communication with Friends and Family: More than three times a week    Frequency of Social Gatherings with Friends and Family: Once a week    Attends Religious Services: Never    Database administrator or Organizations: No    Attends Banker Meetings: Never    Marital Status: Divorced  Catering manager Violence: Not At Risk (11/09/2023)   Humiliation, Afraid, Rape, and Kick questionnaire    Fear of Current or Ex-Partner: No    Emotionally Abused: No    Physically Abused: No    Sexually Abused: No     Allergies  Allergen Reactions   Atorvastatin  Other (See Comments)    Elevated blood sugar    Omeprazole Nausea And Vomiting   Bupropion Nausea Only    Only on the 150mg  tablets, but the 100mg  didn't help with smoking     CBC    Component Value Date/Time   WBC 7.5 11/20/2023 0846   RBC 4.83 11/20/2023 0846   HGB 13.9 11/20/2023 0846   HGB 14.2 03/05/2023 0947   HGB 15.9 02/13/2022 1027   HCT 42.0 11/20/2023 0846   HCT 47.7 (H) 02/13/2022 1027   PLT 232 11/20/2023 0846   PLT 264 03/05/2023 0947   PLT 237 02/13/2022 1027   MCV 87.0 11/20/2023 0846   MCV 84 02/13/2022 1027   MCV 88 03/28/2013 0354   MCH 28.8 11/20/2023 0846   MCHC 33.1 11/20/2023 0846   RDW 14.1 11/20/2023 0846   RDW 14.0 02/13/2022 1027   RDW 13.9 03/28/2013 0354   LYMPHSABS 2.5 11/20/2023 0846   LYMPHSABS 4.3 (H) 02/16/2016 0941   LYMPHSABS 2.0 03/28/2013 0354   MONOABS 0.6 11/20/2023 0846   MONOABS 0.7 03/28/2013 0354   EOSABS 0.3 11/20/2023 0846   EOSABS 0.3 02/16/2016 0941   EOSABS 0.2 03/28/2013 0354   BASOSABS 0.0 11/20/2023 0846   BASOSABS 0.0 02/16/2016 0941   BASOSABS 0.0 03/28/2013 0354    Pulmonary Functions Testing Results:    Latest Ref Rng & Units 07/05/2023    9:39 AM  PFT  Results  FVC-Pre L 2.83   FVC-Predicted Pre % 76   FVC-Post L 3.14   FVC-Predicted Post % 84   Pre FEV1/FVC % % 67   Post FEV1/FCV % % 65   FEV1-Pre L 1.89   FEV1-Predicted Pre % 66   FEV1-Post L 2.05   DLCO uncorrected ml/min/mmHg 11.96   DLCO UNC% % 52   DLVA Predicted % 62   TLC L 5.89   TLC % Predicted % 102   RV % Predicted % 118     Outpatient Medications Prior to Visit  Medication Sig Dispense Refill   acetaminophen (TYLENOL) 325 MG tablet Take 1-2 tablets (325-650 mg total) by mouth every 4 (four) hours as needed for mild pain (pain score 1-3) or moderate pain (pain score 4-6) (or temp >/= 101 F).     acetaminophen (TYLENOL) 500 MG tablet Take 500 mg by mouth every 6 (six) hours as needed for moderate pain (pain score 4-6).     allopurinol  (ZYLOPRIM) 100 MG tablet Take 1 tablet (100 mg total) by mouth daily. 90 tablet 4   aspirin 81 MG tablet Take 81 mg by mouth daily.      bisoprolol (ZEBETA) 10 MG tablet Take 1 tablet (10 mg total) by mouth daily. 90 tablet 1   Budeson-Glycopyrrol-Formoterol (BREZTRI AEROSPHERE) 160-9-4.8 MCG/ACT AERO Inhale 2 puffs into the lungs in the morning and at bedtime. 1284 g 11   buPROPion (WELLBUTRIN XL) 150 MG 24 hr tablet Take 1 tablet (150 mg total) by mouth daily. 30 tablet 3   clopidogrel (PLAVIX) 75 MG tablet Take 1 tablet (75 mg total) by mouth daily. 90 tablet 0   ibuprofen (ADVIL) 200 MG tablet Take 200 mg by mouth 2 (two) times daily as needed (headaches).     lidocaine-prilocaine (EMLA) cream Apply on the port. 30 -45 min  prior to port access. 30 g 3   lisinopril-hydrochlorothiazide (ZESTORETIC) 20-12.5 MG tablet Take 1 tablet by mouth daily. (Patient taking differently: Take 0.5 tablets by mouth daily.) 90 tablet 0   magnesium gluconate (MAGONATE) 500 MG tablet Take 500 mg by mouth at bedtime.     oxyCODONE (OXY IR/ROXICODONE) 5 MG immediate release tablet Take 1-2 tablets (5-10 mg total) by mouth every 4 (four) hours as needed for moderate pain (pain score 4-6). 30 tablet 0   pantoprazole (PROTONIX) 20 MG tablet Take 1 tablet (20 mg total) by mouth daily. 90 tablet 0   Polyethyl Glyc-Propyl Glyc PF (SYSTANE PRESERVATIVE FREE) 0.4-0.3 % SOLN Place 1 drop into both eyes daily as needed (dry eyes).     PROAIR HFA 108 (90 Base) MCG/ACT inhaler Inhale 2 puffs into the lungs every 6 (six) hours as needed for wheezing or shortness of breath. 8.5 g 4   rosuvastatin (CRESTOR) 20 MG tablet Take 1 tablet (20 mg total) by mouth daily. 90 tablet 4   Semaglutide, 1 MG/DOSE, (OZEMPIC, 1 MG/DOSE,) 4 MG/3ML SOPN Inject 1 mg into the skin once a week. 9 mL 1   Facility-Administered Medications Prior to Visit  Medication Dose Route Frequency Provider Last Rate Last Admin   heparin lock flush 100 UNIT/ML  injection

## 2024-01-19 ENCOUNTER — Other Ambulatory Visit: Payer: Self-pay | Admitting: Family Medicine

## 2024-01-19 DIAGNOSIS — I10 Essential (primary) hypertension: Secondary | ICD-10-CM

## 2024-01-21 ENCOUNTER — Inpatient Hospital Stay: Payer: Federal, State, Local not specified - PPO

## 2024-01-21 ENCOUNTER — Inpatient Hospital Stay: Payer: Federal, State, Local not specified - PPO | Attending: Internal Medicine | Admitting: Internal Medicine

## 2024-01-21 VITALS — BP 115/87 | HR 72 | Temp 97.8°F | Resp 16 | Wt 186.6 lb

## 2024-01-21 DIAGNOSIS — Z79899 Other long term (current) drug therapy: Secondary | ICD-10-CM | POA: Diagnosis not present

## 2024-01-21 DIAGNOSIS — F17210 Nicotine dependence, cigarettes, uncomplicated: Secondary | ICD-10-CM | POA: Insufficient documentation

## 2024-01-21 DIAGNOSIS — J449 Chronic obstructive pulmonary disease, unspecified: Secondary | ICD-10-CM | POA: Diagnosis not present

## 2024-01-21 DIAGNOSIS — C3432 Malignant neoplasm of lower lobe, left bronchus or lung: Secondary | ICD-10-CM | POA: Diagnosis not present

## 2024-01-21 DIAGNOSIS — Z803 Family history of malignant neoplasm of breast: Secondary | ICD-10-CM | POA: Diagnosis not present

## 2024-01-21 DIAGNOSIS — Z79620 Long term (current) use of immunosuppressive biologic: Secondary | ICD-10-CM | POA: Insufficient documentation

## 2024-01-21 DIAGNOSIS — Z902 Acquired absence of lung [part of]: Secondary | ICD-10-CM | POA: Insufficient documentation

## 2024-01-21 DIAGNOSIS — C7972 Secondary malignant neoplasm of left adrenal gland: Secondary | ICD-10-CM | POA: Insufficient documentation

## 2024-01-21 DIAGNOSIS — E876 Hypokalemia: Secondary | ICD-10-CM | POA: Insufficient documentation

## 2024-01-21 DIAGNOSIS — E039 Hypothyroidism, unspecified: Secondary | ICD-10-CM

## 2024-01-21 LAB — CBC WITH DIFFERENTIAL (CANCER CENTER ONLY)
Abs Immature Granulocytes: 0.03 10*3/uL (ref 0.00–0.07)
Basophils Absolute: 0.1 10*3/uL (ref 0.0–0.1)
Basophils Relative: 1 %
Eosinophils Absolute: 0.3 10*3/uL (ref 0.0–0.5)
Eosinophils Relative: 3 %
HCT: 45 % (ref 36.0–46.0)
Hemoglobin: 14.9 g/dL (ref 12.0–15.0)
Immature Granulocytes: 0 %
Lymphocytes Relative: 32 %
Lymphs Abs: 3.1 10*3/uL (ref 0.7–4.0)
MCH: 28.5 pg (ref 26.0–34.0)
MCHC: 33.1 g/dL (ref 30.0–36.0)
MCV: 86.2 fL (ref 80.0–100.0)
Monocytes Absolute: 0.6 10*3/uL (ref 0.1–1.0)
Monocytes Relative: 6 %
Neutro Abs: 5.5 10*3/uL (ref 1.7–7.7)
Neutrophils Relative %: 58 %
Platelet Count: 228 10*3/uL (ref 150–400)
RBC: 5.22 MIL/uL — ABNORMAL HIGH (ref 3.87–5.11)
RDW: 14.3 % (ref 11.5–15.5)
WBC Count: 9.6 10*3/uL (ref 4.0–10.5)
nRBC: 0 % (ref 0.0–0.2)

## 2024-01-21 LAB — CMP (CANCER CENTER ONLY)
ALT: 17 U/L (ref 0–44)
AST: 20 U/L (ref 15–41)
Albumin: 3.9 g/dL (ref 3.5–5.0)
Alkaline Phosphatase: 102 U/L (ref 38–126)
Anion gap: 9 (ref 5–15)
BUN: 21 mg/dL (ref 8–23)
CO2: 25 mmol/L (ref 22–32)
Calcium: 9.1 mg/dL (ref 8.9–10.3)
Chloride: 103 mmol/L (ref 98–111)
Creatinine: 0.7 mg/dL (ref 0.44–1.00)
GFR, Estimated: >60 mL/min (ref >60)
Glucose, Bld: 94 mg/dL (ref 70–99)
Potassium: 3.7 mmol/L (ref 3.5–5.1)
Sodium: 137 mmol/L (ref 135–145)
Total Bilirubin: 0.7 mg/dL (ref 0.0–1.2)
Total Protein: 7 g/dL (ref 6.5–8.1)

## 2024-01-21 LAB — TSH: TSH: 1.661 u[IU]/mL (ref 0.350–4.500)

## 2024-01-21 MED ORDER — SODIUM CHLORIDE 0.9% FLUSH
10.0000 mL | INTRAVENOUS | Status: DC | PRN
Start: 2024-01-21 — End: 2024-01-21
  Administered 2024-01-21: 10 mL via INTRAVENOUS
  Filled 2024-01-21: qty 10

## 2024-01-21 MED ORDER — HEPARIN SOD (PORK) LOCK FLUSH 100 UNIT/ML IV SOLN
500.0000 [IU] | Freq: Once | INTRAVENOUS | Status: AC
  Administered 2024-01-21: 500 [IU] via INTRAVENOUS
  Filled 2024-01-21: qty 5

## 2024-01-21 NOTE — Telephone Encounter (Signed)
Requested Prescriptions  Pending Prescriptions Disp Refills   bisoprolol (ZEBETA) 10 MG tablet [Pharmacy Med Name: BISOPROLOL FUMARATE 10 MG TAB] 90 tablet 0    Sig: Take 1 tablet (10 mg total) by mouth daily.     Cardiovascular: Beta Blockers 2 Passed - 01/21/2024 11:54 AM      Passed - Cr in normal range and within 360 days    Creatinine  Date Value Ref Range Status  01/21/2024 0.70 0.44 - 1.00 mg/dL Final   Creat  Date Value Ref Range Status  08/30/2017 0.60 0.50 - 0.99 mg/dL Final    Comment:    For patients >49 years of age, the reference limit for Creatinine is approximately 13% higher for people identified as African-American. .    Creatinine, POC  Date Value Ref Range Status  12/12/2017 n/a mg/dL Final         Passed - Last BP in normal range    BP Readings from Last 1 Encounters:  01/21/24 115/87         Passed - Last Heart Rate in normal range    Pulse Readings from Last 1 Encounters:  01/21/24 72         Passed - Valid encounter within last 6 months    Recent Outpatient Visits           1 month ago Primary hypertension   [facility] Malva Limes, MD   5 months ago Primary hypertension   [facility] Malva Limes, MD   11 months ago Type 2 diabetes mellitus without complication, without long-term current use of insulin (HCC)   [facility] Malva Limes, MD   1 year ago Type 2 diabetes mellitus without complication, without long-term current use of insulin (HCC)   [facility] Malva Limes, MD   1 year ago Type 2 diabetes mellitus without complication, without long-term current use of insulin (HCC)    [facility] Malva Limes, MD       Future Appointments             In 1 month Fisher, Demetrios Isaacs, MD [facility]

## 2024-01-21 NOTE — Assessment & Plan Note (Addendum)
#  JUNE 2023-STAGE IV--recurrent/metastatic disease with New left adrenal metastasis;  PET scan JUNE 20th- 2023-  Enlarged 4 cm hypermetabolic LEFT adrenal metastasis;  Suspicion of metastatic adenopathy to the LEFT hilum.  Part solid nodule in the RIGHT lower lobe without metabolic activity. 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 Currently on single agent Keytruda [PD-L1 greater than 80%].    # CT CAP with contrast- DEC 12th, 2024- Status post left lower lobectomy. No evidence of recurrent or metastatic disease in the chest/abdomen/pelvis. DEC 17th, 2024-  Keytruda - last.   # Continue to hold off further immunotherapy [chemo holiday] given recent CT scans negative for any recurrent malignancy. Will order CT- today; plan Keytruda in 1 month.   # CT scan-DEC 2024- Similar right lower lobe ground-glass densities with differential considerations of scarring or metachronous low-grade adenocarcinoma. Stable.   # Mild Hypokalemia: discussed dietary supplement. stable  # Chronic pain: headaches/cervical pain/back pain [Dr.Naveira; pain doctor]  On NSAIDs [ monitor for now. OCT 2023- MRI brain- NEGATIVE for any metastatic disease.   Stable.  FEB 2024- MRI lumbar spine- degenerative disease- on ibuprofen prn- BID.  stable  # Left rib pain-Post throacotomy pain/tingling and numbness:  Continue gabapentin -300 mg TID; and then at extra at night prn. stable  # CAD/ PVD- cramping- s/p evaluation [Dr.Schneir]; ? Carotid occlusion- s/p CEA surgery- stable.   # COPD-stable encouraged continue to avoid smoking; again counseled to quit smoking; s/p pulmonary;stable.   # IV Access :s/p  port placement stable  *AM appts- Monday-   # DISPOSITION:  # follow up in 1 months- -MD labs/port- cbc/cmp;TSH; CT Scan chest; possible Keytruda; CT prior--  Dr. Leonard Schwartz

## 2024-01-21 NOTE — Progress Notes (Signed)
[facility] CONSULT NOTE  Patient Care Team: Malva Limes, MD as PCP - General (Family Medicine) Schnier, Latina Craver, MD (Vascular Surgery) Lonell Face, MD as Consulting Physician (Neurology) Delano Metz, MD as Referring Physician (Pain Medicine) Pa, [facility] (Optometry) Lady Gary, Darlin Priestly, MD as Consulting Physician (Cardiology) Pasty Spillers, MD (Inactive) as Consulting Physician (Gastroenterology) Nadara Mustard, MD as Referring Physician (Obstetrics and Gynecology) Glory Buff, RN as Oncology Nurse Navigator Earna Coder, MD as Consulting Physician (Oncology) Raechel Chute, MD as Consulting Physician (Pulmonary Disease)   CHIEF COMPLAINTS/PURPOSE OF CONSULTATION: lung cancer  #  Oncology History Overview Note  #MAY 2022- LLL nodule 2.3 cm Adenocarcinoma Stage IA; s/p lobectomy.  No adjuvant therapy  # CT scan 4th June 2023-highly concerning for recurrent/metastatic disease with-. New left adrenal metastasis;  Ground-glass and part solid nodules in the peripheral right lower lobe, unchanged from 11/07/2021 but slightly enlarged from 01/01/2018. Findings are suspicious for indolent adenocarcinoma.  F One- PFL1 =80%; KRAS G12C PET scan JUNE 20th- 2023-  Enlarged 4 cm hypermetabolic LEFT adrenal metastasis;  Suspicion of metastatic adenopathy to the LEFT hilum.  Part solid nodule in the RIGHT lower lobe without metabolic activity.   3 ADRENAL BIOPSY- CK7 POSITIVE ADENOCARCINOMA- There is limited tissue remaining for ancillary testing, not likely  sufficient for NGS testing.   # AUG 7th, 2023-single agent Keytruda.    Right mildly complex cystic adnexal mass noted-June 13th, 2024- Significant interval enlargement of a multiseptated cystic lesion of the right ovary, now measuring 11.7 x 8.2 cm, previously 8.1 x 5.8 cm.- s/p in [facility] [October, 2024]- mucinous cystadenoma - stable.   Primary cancer of left lower lobe of lung (HCC)   05/09/2021 Initial Diagnosis   Primary cancer of left lower lobe of lung (HCC)   07/04/2022 -  Chemotherapy   Patient is on Treatment Plan : LUNG NSCLC Pembrolizumab (200) q21d     07/10/2022 - 07/10/2022 Chemotherapy   Patient is on Treatment Plan : LUNG NSCLC Pembrolizumab (200) q21d      HISTORY OF PRESENTING ILLNESS: Patient  ambulating.  Alone.   Laura Nielsen 68 y.o.  female history of smoking; prior history of lung cancer-with likely recurrence to left adrenal gland [s/p Bx CK-7 positive TTF-1 negative]-clinically suggestive of recurrent lung cancer on single agent Rande Lawman is here for follow-up.  Patient off therapy since in DEC 17th, is here follow up.    Patient had some nausea at time with bupropion. Currently off.  Constipation, uses an enema.   Denies any abdominal pain. Otherwise patient continues to have mild shortness of breath on exertion. Complains of cramping in the legs chronic.   Patient is better frame of mind since moving to [place].   Review of Systems  Constitutional:  Negative for chills, diaphoresis, fever, malaise/fatigue and weight loss.  HENT:  Negative for nosebleeds and sore throat.   Eyes:  Negative for double vision.  Respiratory:  Negative for cough, hemoptysis, sputum production, shortness of breath and wheezing.   Cardiovascular:  Negative for palpitations, orthopnea and leg swelling.  Gastrointestinal:  Negative for abdominal pain, blood in stool, constipation, diarrhea, heartburn, melena, nausea and vomiting.  Genitourinary:  Negative for dysuria, frequency and urgency.  Musculoskeletal:  Positive for back pain and joint pain.  Skin: Negative.  Negative for itching and rash.  Neurological:  Positive for tingling. Negative for dizziness, focal weakness, weakness and headaches.  Endo/Heme/Allergies:  Does not bruise/bleed easily.  Psychiatric/Behavioral:  Negative for depression. The patient is not nervous/anxious and does not have insomnia.       MEDICAL HISTORY:  Past Medical History:  Diagnosis Date   AAA (abdominal aortic aneurysm) (HCC)    a.) s/p EVAR stent graft repair 03/27/2013   Adnexal mass    Aortic atherosclerosis (HCC)    Arthritis    Asthma    Atherosclerosis of native artery of both lower extremities with intermittent claudication (HCC)    B12 deficiency    Bilateral carotid artery stenosis    a.) doppler 05/06/2020 and 06/30/2021: 1-39% BICA; b.) doppler 11/20/2022: 60-79% RICA; c.) CTA neck 08/17/2023: >75% RICA, 30% LICA   CAD (coronary artery disease) 03/16/2009   a.) LHC/PCI 03/16/2009: 25% mLCx, 70% mRCA (3 x 18 mm Xience V DES); b.) MV 10/22/2017 and 03/16/2021: no ischemia; c.) MV 07/16/2023: mild apical ischemia   Cancer of left adrenal gland (HCC) 06/20/2022   a.) CNB 06/20/2022 -> pathology (+) for CK7 (TTF-1 -) adenocarcinoma   Centrilobular emphysema (HCC)    Cervical spinal stenosis    Cervical spondylosis with radiculopathy    Chronic back pain    Colon polyp    Complex ovarian cyst    Complication of anesthesia    a.) prone to experience postoperative hypotension   COPD (chronic obstructive pulmonary disease) (HCC)    DDD (degenerative disc disease), cervical    DDD (degenerative disc disease), thoracolumbar    Depression    Diabetes mellitus type 2, controlled (HCC)    Diastolic dysfunction 06/29/2023   a.) TTE 06/29/2023: EF >55%, no RWMAs, G1DD, triv PR, mild MR/TR   Fatty liver    GERD (gastroesophageal reflux disease)    Gout    History of bilateral cataract extraction    History of kidney stones    Hypercholesteremia    Long term (current) use of aspirin    Long term current use of clopidogrel    Lumbar spinal stenosis    Lumbar spondylosis    Non-small cell lung cancer metastatic to adrenal gland (HCC)    a.) s/p LLL wedge resection 04/06/2021 --> pathology (+) for well differentiated adenocarcinoma (stage 1A) --> no adjuvant Tx; b.) recurrent stage IV adenocarcinoma 05/2022  --> Tx'd with  pembrolizumab   Osteoarthritis    Osteoporosis    Primary hypertension    Seasonal allergies    Sleep apnea    a.) no nocturnal PAP therapy since 2021   Vitamin D deficiency     SURGICAL HISTORY: Past Surgical History:  Procedure Laterality Date   ABDOMINAL AORTIC ENDOVASCULAR STENT GRAFT  03/27/2013   Dr. Levora Dredge   ABDOMINAL HYSTERECTOMY     Menometrorrhagia. Excessive bleeding. Unknown if cervix removed.    BILATERAL SALPINGOOPHORECTOMY  09/27/2023   CATARACT EXTRACTION W/PHACO Left 05/14/2023   Procedure: CATARACT EXTRACTION PHACO AND INTRAOCULAR LENS PLACEMENT (IOC) LEFT DIABETIC  OMIDRIA  19.40  01:34.8;  Surgeon: Estanislado Pandy, MD;  Location: [facility];  Service: Ophthalmology;  Laterality: Left;   CATARACT EXTRACTION W/PHACO Right 06/27/2023   Procedure: CATARACT EXTRACTION PHACO AND INTRAOCULAR LENS PLACEMENT (IOC) RIGHT DIABETIC  6.47  00:42.6;  Surgeon: Estanislado Pandy, MD;  Location: [facility];  Service: Ophthalmology;  Laterality: Right;   COLONOSCOPY WITH PROPOFOL N/A 01/05/2021   Procedure: COLONOSCOPY WITH PROPOFOL;  Surgeon: Pasty Spillers, MD;  Location: [facility];  Service: Endoscopy;  Laterality: N/A;   CORONARY ANGIOPLASTY WITH STENT PLACEMENT  03/16/2009   Procedure:  CORONARY ANGIOPLASTY WITH STENT PLACEMENT; Location: [facility]; Surgeons: Harold Hedge, MD (diagnostic) and Lorine Bears, MD (interventional)   ENDARTERECTOMY Right 11/09/2023   Procedure: ENDARTERECTOMY CAROTID;  Surgeon: Renford Dills, MD;  Location: [facility];  Service: Vascular;  Laterality: Right;   ESOPHAGOGASTRODUODENOSCOPY (EGD) WITH PROPOFOL N/A 01/05/2021   Procedure: ESOPHAGOGASTRODUODENOSCOPY (EGD) WITH PROPOFOL;  Surgeon: Pasty Spillers, MD;  Location: [facility];  Service: Endoscopy;  Laterality: N/A;   EXTRACORPOREAL SHOCK WAVE LITHOTRIPSY Right 1999   INTERCOSTAL NERVE BLOCK Left 04/06/2021   Procedure:  INTERCOSTAL NERVE BLOCK;  Surgeon: Loreli Slot, MD;  Location[facility];  Service: Thoracic;  Laterality: Left;   IR IMAGING GUIDED PORT INSERTION  06/20/2022   LUNG LOBECTOMY Left 04/06/2021   robotic left lower lobectomy 04/06/2021 Dr. Dorris Fetch for Stage 1A adenocarcinoma   NODE DISSECTION Left 04/06/2021   Procedure: NODE DISSECTION;  Surgeon: Loreli Slot, MD;  Location: [facility] OR;  Service: Thoracic;  Laterality: Left;   TUBAL LIGATION      SOCIAL HISTORY: Social History   Socioeconomic History   Marital status: Divorced    Spouse name: Not on file   Number of children: 2   Years of education: H/S   Highest education level: 12th grade  Occupational History   Occupation: Disabled   Occupation: retired  Tobacco Use   Smoking status: Every Day    Current packs/day: 1.00    Average packs/day: 0.7 packs/day for 96.6 years (63.3 ttl pk-yrs)    Types: Cigarettes    Start date: 1973   Smokeless tobacco: Never   Tobacco comments:    12/23/19 states she quit for 9 months and then started back.   Vaping Use   Vaping status: Never Used  Substance and Sexual Activity   Alcohol use: Not Currently   Drug use: No   Sexual activity: Not on file  Other Topics Concern   Not on file  Social History Narrative   Hx of smoking; quit prior to lung surgery then started back. Lives in [place] alone. Used to work in [employer], [employer]- Facilities manager. On disability sec to spinal pain.    Social Drivers of Health   Financial Resource Strain: Medium Risk (11/26/2023)   Overall Financial Resource Strain (CARDIA)    Difficulty of Paying Living Expenses: Somewhat hard  Food Insecurity: No Food Insecurity (11/22/2023)   Hunger Vital Sign    Worried About Running Out of Food in the Last Year: Never true    Ran Out of Food in the Last Year: Never true  Transportation Needs: No Transportation Needs (11/22/2023)   PRAPARE - Administrator, Civil Service (Medical): No    Lack of  Transportation (Non-Medical): No  Physical Activity: Unknown (11/26/2023)   Exercise Vital Sign    Days of Exercise per Week: 0 days    Minutes of Exercise per Session: Patient declined  Stress: Stress Concern Present (11/26/2023)   [employer] of Occupational Health - Occupational Stress Questionnaire    Feeling of Stress : Very much  Social Connections: Socially Isolated (11/26/2023)   Social Connection and Isolation Panel [NHANES]    Frequency of Communication with Friends and Family: More than three times a week    Frequency of Social Gatherings with Friends and Family: Once a week    Attends Religious Services: Never    Database administrator or Organizations: No    Attends Banker Meetings: Never    Marital Status: Divorced  Intimate  Partner Violence: Not At Risk (11/09/2023)   Humiliation, Afraid, Rape, and Kick questionnaire    Fear of Current or Ex-Partner: No    Emotionally Abused: No    Physically Abused: No    Sexually Abused: No    FAMILY HISTORY: Family History  Problem Relation Age of Onset   Hypertension Mother    Coronary artery disease Mother    Heart attack Mother        acute   Cancer Mother    Alcohol abuse Father    Depression Father    Hypertension Father    Heart attack Father 39       acute   Alcohol abuse Sister    Hyperlipidemia Sister    Hypertension Sister    Cancer Sister 36   Heart attack Sister        x's 2   Coronary artery disease Sister 67       x's 2   Breast cancer Sister 41    ALLERGIES:  is allergic to atorvastatin, omeprazole, and bupropion.  MEDICATIONS:  Current Outpatient Medications  Medication Sig Dispense Refill   acetaminophen (TYLENOL) 325 MG tablet Take 1-2 tablets (325-650 mg total) by mouth every 4 (four) hours as needed for mild pain (pain score 1-3) or moderate pain (pain score 4-6) (or temp >/= 101 F).     acetaminophen (TYLENOL) 500 MG tablet Take 500 mg by mouth every 6 (six) hours as  needed for moderate pain (pain score 4-6).     allopurinol (ZYLOPRIM) 100 MG tablet Take 1 tablet (100 mg total) by mouth daily. 90 tablet 4   aspirin 81 MG tablet Take 81 mg by mouth daily.      bisoprolol (ZEBETA) 10 MG tablet Take 1 tablet (10 mg total) by mouth daily. 90 tablet 1   Budeson-Glycopyrrol-Formoterol (BREZTRI AEROSPHERE) 160-9-4.8 MCG/ACT AERO Inhale 2 puffs into the lungs in the morning and at bedtime. 1284 g 11   buPROPion (WELLBUTRIN XL) 150 MG 24 hr tablet Take 1 tablet (150 mg total) by mouth daily. 30 tablet 3   clopidogrel (PLAVIX) 75 MG tablet Take 1 tablet (75 mg total) by mouth daily. 90 tablet 0   ibuprofen (ADVIL) 200 MG tablet Take 200 mg by mouth 2 (two) times daily as needed (headaches).     lidocaine-prilocaine (EMLA) cream Apply on the port. 30 -45 min  prior to port access. 30 g 3   lisinopril-hydrochlorothiazide (ZESTORETIC) 20-12.5 MG tablet Take 1 tablet by mouth daily. (Patient taking differently: Take 0.5 tablets by mouth daily. 1 tab alternating with 1/2 tab) 90 tablet 0   magnesium gluconate (MAGONATE) 500 MG tablet Take 500 mg by mouth at bedtime.     pantoprazole (PROTONIX) 20 MG tablet Take 1 tablet (20 mg total) by mouth daily. 90 tablet 0   Polyethyl Glyc-Propyl Glyc PF (SYSTANE PRESERVATIVE FREE) 0.4-0.3 % SOLN Place 1 drop into both eyes daily as needed (dry eyes).     PROAIR HFA 108 (90 Base) MCG/ACT inhaler Inhale 2 puffs into the lungs every 6 (six) hours as needed for wheezing or shortness of breath. 8.5 g 4   rosuvastatin (CRESTOR) 20 MG tablet Take 1 tablet (20 mg total) by mouth daily. 90 tablet 4   Semaglutide, 1 MG/DOSE, (OZEMPIC, 1 MG/DOSE,) 4 MG/3ML SOPN Inject 1 mg into the skin once a week. 9 mL 1   oxyCODONE (OXY IR/ROXICODONE) 5 MG immediate release tablet Take 1-2 tablets (5-10 mg total) by mouth every  4 (four) hours as needed for moderate pain (pain score 4-6). (Patient not taking: Reported on 01/21/2024) 30 tablet 0   No current  facility-administered medications for this visit.   Facility-Administered Medications Ordered in Other Visits  Medication Dose Route Frequency Provider Last Rate Last Admin   heparin lock flush 100 UNIT/ML injection             Mild maculopapular rash on the Maller area left more than right.  PHYSICAL EXAMINATION: ECOG PERFORMANCE STATUS: 1 - Symptomatic but completely ambulatory  Vitals:   01/21/24 1008  BP: 115/87  Pulse: 72  Resp: 16  Temp: 97.8 F (36.6 C)  SpO2: 100%       Filed Weights   01/21/24 1008  Weight: 186 lb 9.6 oz (84.6 kg)        Physical Exam HENT:     Head: Normocephalic and atraumatic.     Mouth/Throat:     Pharynx: No oropharyngeal exudate.  Eyes:     Pupils: Pupils are equal, round, and reactive to light.  Cardiovascular:     Rate and Rhythm: Normal rate and regular rhythm.  Pulmonary:     Comments: Decreased breath sounds bilaterally.  No wheeze or crackles Abdominal:     General: Bowel sounds are normal. There is no distension.     Palpations: Abdomen is soft. There is no mass.     Tenderness: There is no abdominal tenderness. There is no guarding or rebound.  Musculoskeletal:        General: No tenderness. Normal range of motion.     Cervical back: Normal range of motion and neck supple.  Skin:    General: Skin is warm.  Neurological:     Mental Status: She is alert and oriented to person, place, and time.  Psychiatric:        Mood and Affect: Affect normal.      LABORATORY DATA:  I have reviewed the data as listed Lab Results  Component Value Date   WBC 9.6 01/21/2024   HGB 14.9 01/21/2024   HCT 45.0 01/21/2024   MCV 86.2 01/21/2024   PLT 228 01/21/2024   Recent Labs    10/22/23 0923 11/10/23 0456 11/20/23 0846 01/21/24 0945  NA 140 139 140 137  K 4.1 3.9 3.8 3.7  CL 105 108 107 103  CO2 24 22 25 25   GLUCOSE 97 131* 91 94  BUN 17 17 15 21   CREATININE 0.68 0.60 0.64 0.70  CALCIUM 9.6 9.4 9.4 9.1  GFRNONAA  >60 >60 >60 >60  PROT 7.4  --  6.7 7.0  ALBUMIN 4.2  --  3.5 3.9  AST 20  --  20 20  ALT 16  --  15 17  ALKPHOS 112  --  78 102  BILITOT 0.5  --  0.6 0.7    RADIOGRAPHIC STUDIES: I have personally reviewed the radiological images as listed and agreed with the findings in the report. No results found.    ASSESSMENT & PLAN:   Primary cancer of left lower lobe of lung (HCC) #JUNE 2023-STAGE IV--recurrent/metastatic disease with New left adrenal metastasis;  PET scan JUNE 20th- 2023-  Enlarged 4 cm hypermetabolic LEFT adrenal metastasis;  Suspicion of metastatic adenopathy to the LEFT hilum.  Part solid nodule in the RIGHT lower lobe without metabolic activity. 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 Currently on single  agent Keytruda [PD-L1 greater than 80%].    # CT CAP with contrast- DEC 12th, 2024- Status post left lower lobectomy. No evidence of recurrent or metastatic disease in the chest/abdomen/pelvis. DEC 17th, 2024-  Keytruda - last.   # Continue to hold off further immunotherapy [chemo holiday] given recent CT scans negative for any recurrent malignancy. Will order CT- today; plan Keytruda in 1 month.   # CT scan-DEC 2024- Similar right lower lobe ground-glass densities with differential considerations of scarring or metachronous low-grade adenocarcinoma. Stable.   # Mild Hypokalemia: discussed dietary supplement. stable  # Chronic pain: headaches/cervical pain/back pain [Dr.Naveira; pain doctor]  On NSAIDs [ monitor for now. OCT 2023- MRI brain- NEGATIVE for any metastatic disease.   Stable.  FEB 2024- MRI lumbar spine- degenerative disease- on ibuprofen prn- BID.  stable  # Left rib pain-Post throacotomy pain/tingling and numbness:  Continue gabapentin -300 mg TID; and then at extra at night prn. stable  # CAD/ PVD- cramping- s/p evaluation [Dr.Schneir]; ? Carotid  occlusion- s/p CEA surgery- stable.   # COPD-stable encouraged continue to avoid smoking; again counseled to quit smoking; s/p pulmonary;stable.   # IV Access :s/p  port placement stable  *AM appts- Monday-   # DISPOSITION:  # follow up in 1 months- -MD labs/port- cbc/cmp;TSH; CT Scan chest; possible Keytruda; CT prior--  Dr. Fabio Neighbors, MD 01/21/2024 11:07 AM

## 2024-01-21 NOTE — Progress Notes (Signed)
Pt in for follow up, denies any concerns or difficulties today. 

## 2024-01-25 ENCOUNTER — Other Ambulatory Visit: Payer: Self-pay | Admitting: Family Medicine

## 2024-01-25 DIAGNOSIS — E119 Type 2 diabetes mellitus without complications: Secondary | ICD-10-CM

## 2024-01-25 NOTE — Telephone Encounter (Signed)
Pt has been without for 2 weeks, Prior Berkley Harvey has been approved. Pt called pharmacy today, nothing there for her. Please advise     Copied from CRM [id number]. Topic: Clinical - Medication Refill >> Jan 25, 2024  8:40 AM Franchot Heidelberg wrote: Most Recent Primary Care Visit:  Provider: Malva Limes  Department: [id number]  Visit Type: OFFICE VISIT  Date: 11/26/2023  Medication: Semaglutide, 1 MG/DOSE, (OZEMPIC, 1 MG/DOSE,) 4 MG/3ML SOPN  Has the patient contacted their pharmacy? Yes (Agent: If no, request that the patient contact the pharmacy for the refill. If patient does not wish to contact the pharmacy document the reason why and proceed with request.) (Agent: If yes, when and what did the pharmacy advise?)  Is this the correct pharmacy for this prescription? Yes If no, delete pharmacy and type the correct one.  This is the patient's preferred pharmacy:  [facility] - [place], [place] - [address] [place] [address] Phone: [phone] Fax: [phone]   Has the prescription been filled recently? Yes  Is the patient out of the medication? Yes  Has the patient been seen for an appointment in the last year OR does the patient have an upcoming appointment? Yes  Can we respond through MyChart? Yes  Agent: Please be advised that Rx refills may take up to 3 business days. We ask that you follow-up with your pharmacy.

## 2024-01-26 MED ORDER — OZEMPIC (1 MG/DOSE) 4 MG/3ML ~~LOC~~ SOPN: 1.0000 mg | PEN_INJECTOR | SUBCUTANEOUS | 1 refills | Status: DC

## 2024-01-28 ENCOUNTER — Telehealth: Payer: Self-pay | Admitting: Family Medicine

## 2024-01-28 ENCOUNTER — Other Ambulatory Visit (HOSPITAL_COMMUNITY): Payer: Self-pay

## 2024-01-28 NOTE — Telephone Encounter (Signed)
 [facility] is requesting prior authorization Key: [id number] Ozempic (1 MG/DOSE) 4 MG/3ML pen injectors

## 2024-01-29 ENCOUNTER — Other Ambulatory Visit: Payer: Self-pay | Admitting: Family Medicine

## 2024-01-29 DIAGNOSIS — E119 Type 2 diabetes mellitus without complications: Secondary | ICD-10-CM

## 2024-01-29 NOTE — Telephone Encounter (Signed)
 Copied from CRM [id number]. Topic: Clinical - Medication Refill >> Jan 29, 2024  1:47 PM Gery Pray wrote: Most Recent Primary Care Visit:  Provider: Malva Limes  Department: [id number]  Visit Type: OFFICE VISIT  Date: 11/26/2023  Medication: Semaglutide, 1 MG/DOSE, (OZEMPIC, 1 MG/DOSE,) 4 MG/3ML SOPN. Patient calling to get the status of the PA that the insurance company is requesting. Patient would like a callback [phone]  Has the patient contacted their pharmacy? Yes (Agent: If no, request that the patient contact the pharmacy for the refill. If patient does not wish to contact the pharmacy document the reason why and proceed with request.) (Agent: If yes, when and what did the pharmacy advise?)  Is this the correct pharmacy for this prescription? Yes If no, delete pharmacy and type the correct one.  This is the patient's preferred pharmacy:  [facility] - [place], [place] - [address] [place] [address] Phone: [phone] Fax: [phone]   Has the prescription been filled recently? No  Is the patient out of the medication? Yes  Has the patient been seen for an appointment in the last year OR does the patient have an upcoming appointment? Yes  Can we respond through MyChart? Yes  Agent: Please be advised that Rx refills may take up to 3 business days. We ask that you follow-up with your pharmacy.

## 2024-01-31 ENCOUNTER — Other Ambulatory Visit: Payer: Self-pay | Admitting: Family Medicine

## 2024-01-31 ENCOUNTER — Other Ambulatory Visit (HOSPITAL_COMMUNITY): Payer: Self-pay

## 2024-01-31 DIAGNOSIS — Z1231 Encounter for screening mammogram for malignant neoplasm of breast: Secondary | ICD-10-CM

## 2024-02-01 ENCOUNTER — Telehealth: Payer: Self-pay

## 2024-02-01 ENCOUNTER — Other Ambulatory Visit (HOSPITAL_COMMUNITY): Payer: Self-pay

## 2024-02-01 NOTE — Telephone Encounter (Signed)
 Copied from CRM [id number]. Topic: Clinical - Prescription Issue >> Feb 01, 2024  9:56 AM Antony Haste wrote: Reason for CRM: Patient called in today to check on the status of her prior authorization for Semaglutide (Ozempic), I informed the patient the faxed paperwork has been received as of 02/25 and it will be completed and faxed back over to her insurance. The patient wanted to let her provider know this will be her 3rd week without her Ozempic, she usually takes it on Saturday's. She is wanting to know if her PA can be completed before the weekend?

## 2024-02-01 NOTE — Telephone Encounter (Signed)
 Patient is able to get a transition fill for a co-pay of $60.00. Prior authorization will be started for future fills and documented in a new encounter

## 2024-02-01 NOTE — Telephone Encounter (Signed)
 Pharmacy Patient Advocate Encounter   Received notification from Pt Calls Messages that prior authorization for Ozempic (1 MG/DOSE) 4MG /3ML pen-injectors is required/requested.   Insurance verification completed.   The patient is insured through [facility] Medicare .   Per test claim: PA required; PA submitted to above mentioned insurance via CoverMyMeds Key/confirmation #/EOC [id number] Status is pending

## 2024-02-01 NOTE — Telephone Encounter (Signed)
 Pharmacy Patient Advocate Encounter  Received notification from [employer] [facility] [employer] that Prior Authorization for Ozempic (1 MG/DOSE) 4MG /3ML pen-injectors has been APPROVED from 02-01-2024 to 01-31-2025   PA #/Case ID/Reference #: [id number]

## 2024-02-04 ENCOUNTER — Ambulatory Visit
Admission: RE | Admit: 2024-02-04 | Discharge: 2024-02-04 | Disposition: A | Discharge status: Home / Self Care | Payer: Medicare Other | Source: Ambulatory Visit | Attending: Internal Medicine | Admitting: Internal Medicine

## 2024-02-04 DIAGNOSIS — J432 Centrilobular emphysema: Secondary | ICD-10-CM | POA: Diagnosis not present

## 2024-02-04 DIAGNOSIS — C3490 Malignant neoplasm of unspecified part of unspecified bronchus or lung: Secondary | ICD-10-CM | POA: Diagnosis not present

## 2024-02-04 DIAGNOSIS — C3432 Malignant neoplasm of lower lobe, left bronchus or lung: Secondary | ICD-10-CM | POA: Insufficient documentation

## 2024-02-04 DIAGNOSIS — I700 Atherosclerosis of aorta: Secondary | ICD-10-CM | POA: Diagnosis not present

## 2024-02-04 MED ORDER — IOHEXOL 300 MG/ML  SOLN
75.0000 mL | Freq: Once | INTRAMUSCULAR | Status: AC | PRN
  Administered 2024-02-04: 75 mL via INTRAVENOUS

## 2024-02-11 DIAGNOSIS — I2510 Atherosclerotic heart disease of native coronary artery without angina pectoris: Secondary | ICD-10-CM | POA: Diagnosis not present

## 2024-02-11 DIAGNOSIS — Z720 Tobacco use: Secondary | ICD-10-CM | POA: Diagnosis not present

## 2024-02-11 DIAGNOSIS — E782 Mixed hyperlipidemia: Secondary | ICD-10-CM | POA: Diagnosis not present

## 2024-02-16 IMAGING — CT CT CHEST W/ CM
2 of 4 series · 15 of 36 positions shown, 18 images · IV contrast (agent unspecified)
Comparison: 11/07/2021 and 01/01/2018.

CLINICAL DATA: Non-small cell lung cancer, lower left chest
soreness. * Tracking Code: BO *

EXAM:
CT CHEST WITH CONTRAST
TECHNIQUE: Multidetector CT imaging of the chest was performed during
intravenous contrast administration.

[Series 2: axial chest 2.00 · axial · 0.69mm/px · z∈[-1239,-941]mm · 12 of 177 slices shown, 15 images]
[im 14/177  mediastinal]
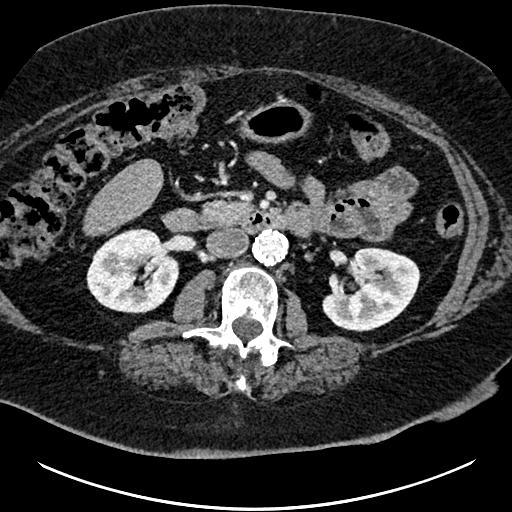
[im 14/177  lung]
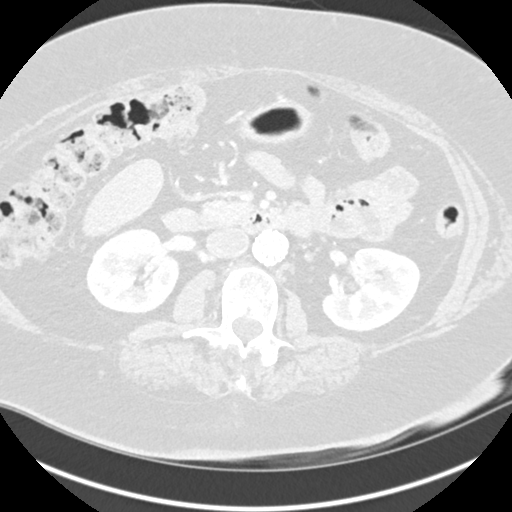
[im 28/177  lung]
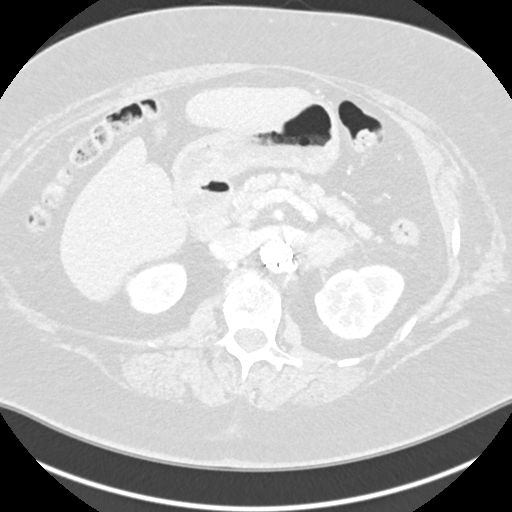
[im 41/177  lung]
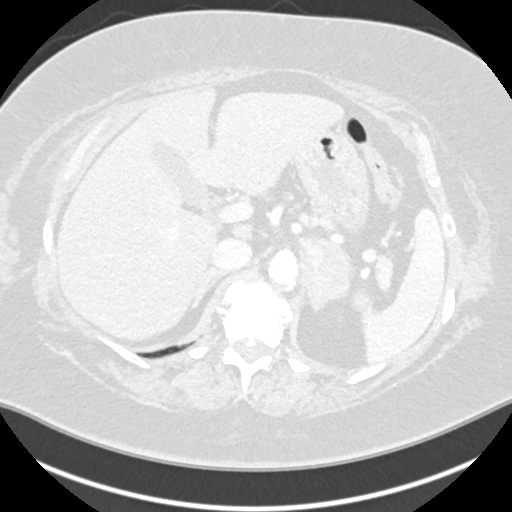
[im 55/177  lung]
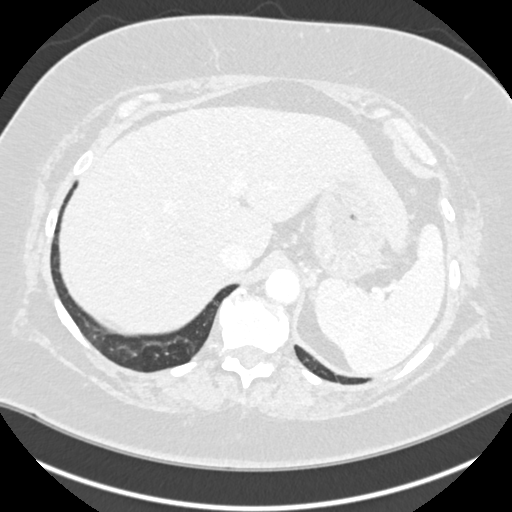
[im 68/177  mediastinal]
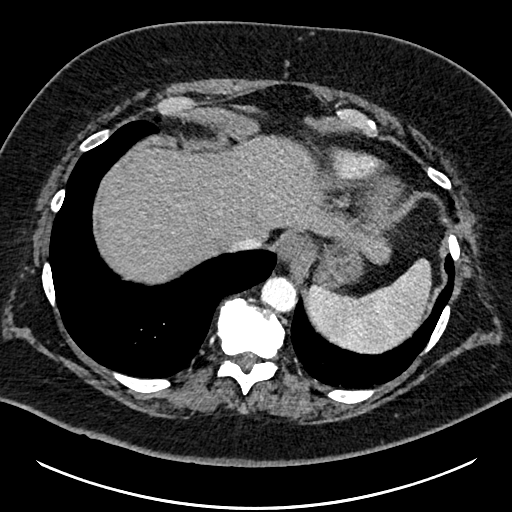
[im 68/177  lung]
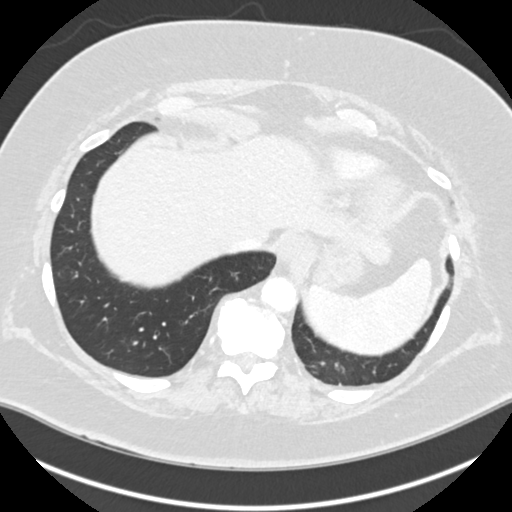
[im 82/177  lung]
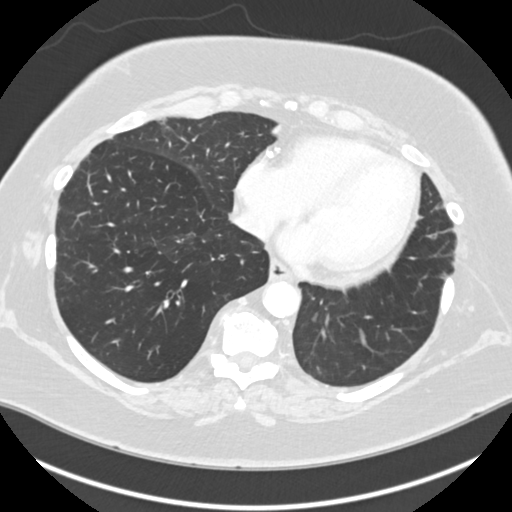
[im 95/177  lung]
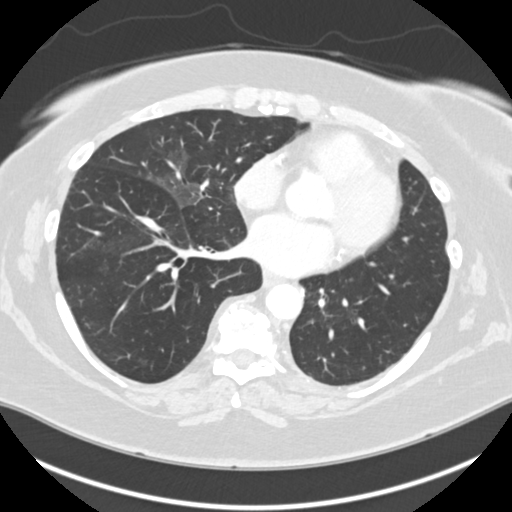
[im 109/177  lung]
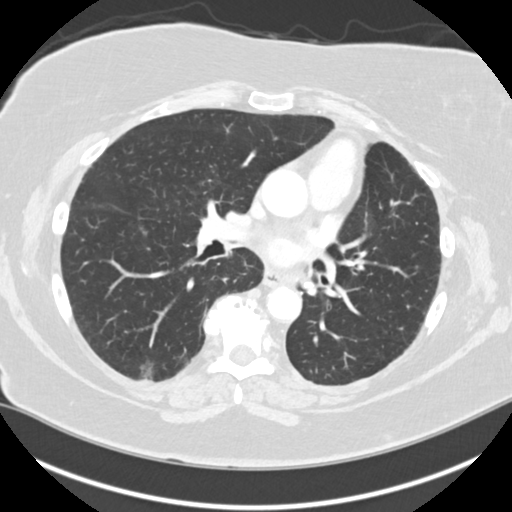
[im 122/177  mediastinal]
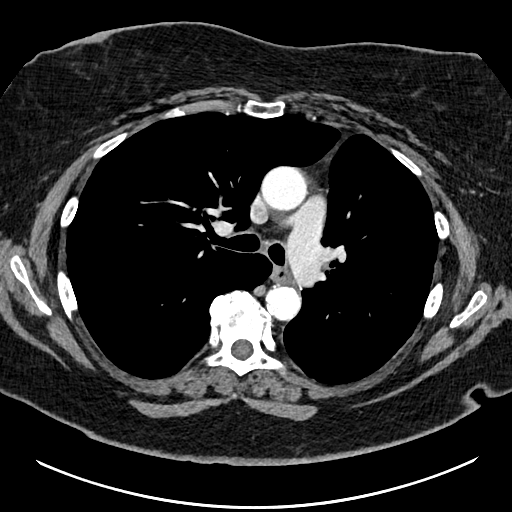
[im 122/177  lung]
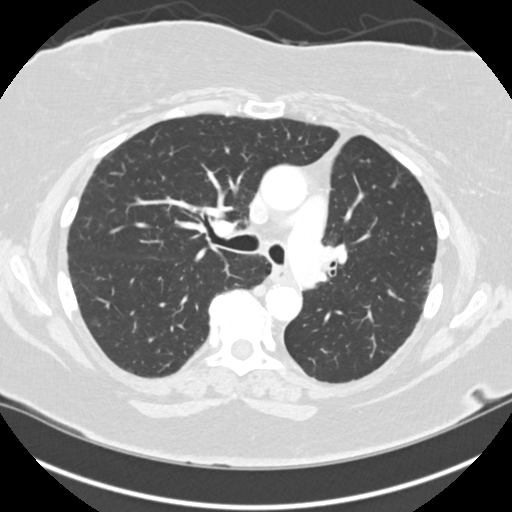
[im 136/177  lung]
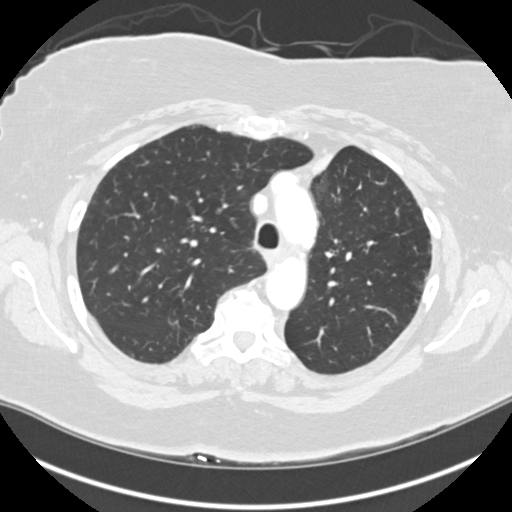
[im 149/177  lung]
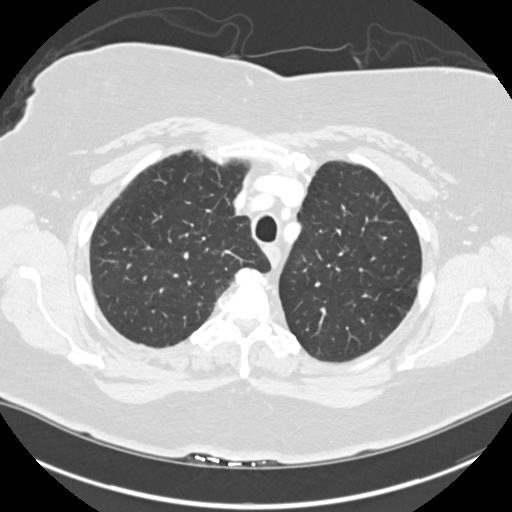
[im 163/177  lung]
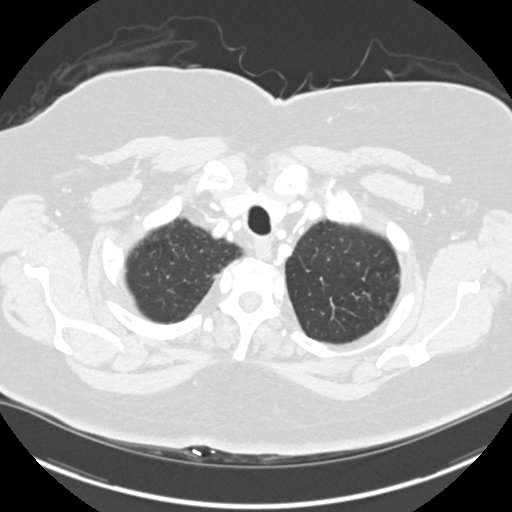

[Series 4: coronal chest 2.00 cor · coronal · 0.69mm/px · 3 of 168 slices shown]
[im 34/168  lung]
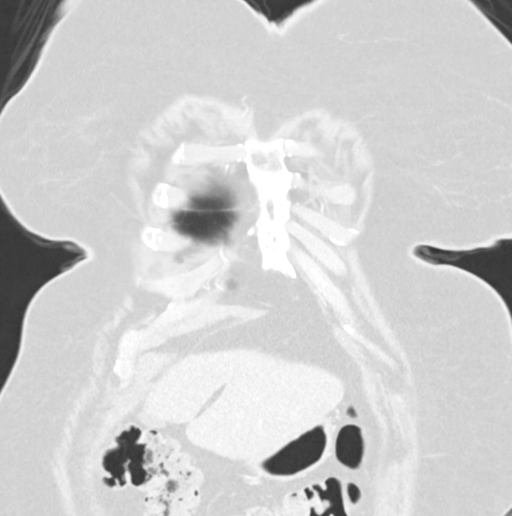
[im 67/168  lung]
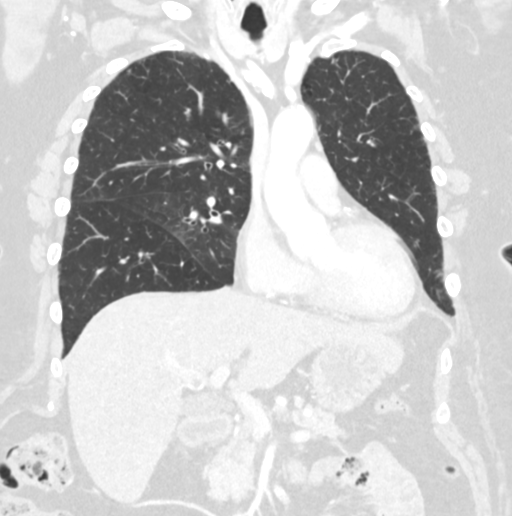
[im 101/168  lung]
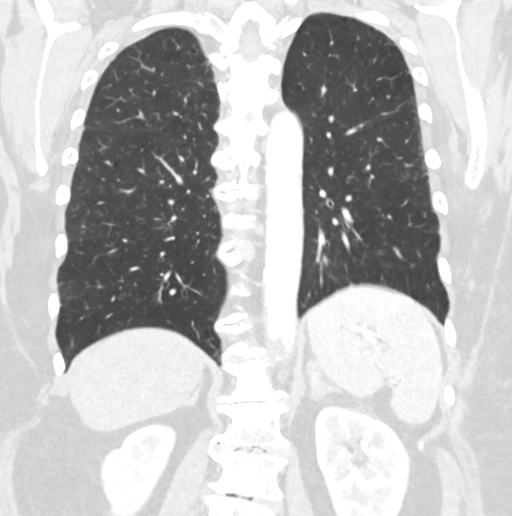

[15 of 36 positions shown; findings below may reference images not displayed]

RADIATION DOSE REDUCTION: This exam was performed according to the
departmental dose-optimization program which includes automated
exposure control, adjustment of the mA and/or kV according to
patient size and/or use of iterative reconstruction technique.

CONTRAST:  75mL OMNIPAQUE IOHEXOL 300 MG/ML  SOLN
FINDINGS: Cardiovascular: Atherosclerotic calcification of the aorta and
coronary arteries. Heart size normal. No pericardial effusion.

Mediastinum/Nodes: No pathologically enlarged mediastinal, hilar or
axillary lymph nodes. Esophagus is unremarkable.

Lungs/Pleura: Centrilobular emphysema. Smoking related respiratory
bronchiolitis. 8 x 10 mm ground-glass nodule in the posterior right
lower lobe (3/85), stable from 11/07/2021 but overall increased from
01/01/2018, at which time it measured 5 mm. 11 mm subsolid nodule in
the posterolateral right lower lobe with a 3 mm internal solid
component (3/78), also unchanged from 11/07/2021 but enlarged from
01/01/2018, 7 mm. Left lower lobectomy. No pleural fluid. Airway is
otherwise unremarkable.

Upper Abdomen: Liver may be decreased in attenuation diffusely.
Probable small pseudotumor along the posterior right hepatic lobe
(2/134), unchanged. Liver, gallbladder and right adrenal gland are
unremarkable. New left adrenal mass measures at least 3.6 x 3.8 cm.
Scarring in the right kidney. Visualized portions of the kidneys,
spleen, pancreas, stomach and bowel are grossly unremarkable.
Similar 11 mm portacaval lymph node. Partially visualized aortic
endograft stent. Atherosclerotic calcification of the aorta.

Musculoskeletal: Degenerative changes in the spine. No worrisome
lytic or sclerotic lesions.
IMPRESSION: 1. New left adrenal metastasis.
2. Ground-glass and part solid nodules in the peripheral right lower
lobe, unchanged from 11/07/2021 but slightly enlarged from
01/01/2018. Findings are suspicious for indolent adenocarcinoma.
Continued attention on follow-up is recommended.
3. Liver may be steatotic.
4. Aortic atherosclerosis ([id number]). Coronary artery
calcification.
5.  Emphysema ([id number].9).

## 2024-02-18 ENCOUNTER — Inpatient Hospital Stay (HOSPITAL_BASED_OUTPATIENT_CLINIC_OR_DEPARTMENT_OTHER): Payer: Federal, State, Local not specified - PPO | Attending: Internal Medicine | Admitting: Internal Medicine

## 2024-02-18 ENCOUNTER — Encounter: Payer: Self-pay | Admitting: Internal Medicine

## 2024-02-18 ENCOUNTER — Inpatient Hospital Stay: Payer: Federal, State, Local not specified - PPO | Attending: Internal Medicine

## 2024-02-18 ENCOUNTER — Inpatient Hospital Stay: Payer: Federal, State, Local not specified - PPO

## 2024-02-18 VITALS — BP 135/81 | HR 74 | Temp 97.4°F | Resp 16 | Ht 68.0 in | Wt 189.2 lb

## 2024-02-18 DIAGNOSIS — Z5112 Encounter for antineoplastic immunotherapy: Secondary | ICD-10-CM | POA: Insufficient documentation

## 2024-02-18 DIAGNOSIS — Z79899 Other long term (current) drug therapy: Secondary | ICD-10-CM | POA: Diagnosis not present

## 2024-02-18 DIAGNOSIS — Z79620 Long term (current) use of immunosuppressive biologic: Secondary | ICD-10-CM | POA: Diagnosis not present

## 2024-02-18 DIAGNOSIS — F17210 Nicotine dependence, cigarettes, uncomplicated: Secondary | ICD-10-CM | POA: Diagnosis not present

## 2024-02-18 DIAGNOSIS — E039 Hypothyroidism, unspecified: Secondary | ICD-10-CM

## 2024-02-18 DIAGNOSIS — C3432 Malignant neoplasm of lower lobe, left bronchus or lung: Secondary | ICD-10-CM

## 2024-02-18 DIAGNOSIS — C7972 Secondary malignant neoplasm of left adrenal gland: Secondary | ICD-10-CM | POA: Diagnosis not present

## 2024-02-18 LAB — COMPREHENSIVE METABOLIC PANEL
ALT: 14 U/L (ref 0–44)
AST: 18 U/L (ref 15–41)
Albumin: 3.6 g/dL (ref 3.5–5.0)
Alkaline Phosphatase: 96 U/L (ref 38–126)
Anion gap: 6 (ref 5–15)
BUN: 17 mg/dL (ref 8–23)
CO2: 27 mmol/L (ref 22–32)
Calcium: 9.3 mg/dL (ref 8.9–10.3)
Chloride: 107 mmol/L (ref 98–111)
Creatinine, Ser: 0.64 mg/dL (ref 0.44–1.00)
GFR, Estimated: >60 mL/min (ref >60)
Glucose, Bld: 97 mg/dL (ref 70–99)
Potassium: 3.4 mmol/L — ABNORMAL LOW (ref 3.5–5.1)
Sodium: 140 mmol/L (ref 135–145)
Total Bilirubin: 0.7 mg/dL (ref 0.0–1.2)
Total Protein: 6.5 g/dL (ref 6.5–8.1)

## 2024-02-18 LAB — CBC WITH DIFFERENTIAL/PLATELET
Abs Immature Granulocytes: 0.03 10*3/uL (ref 0.00–0.07)
Basophils Absolute: 0.1 10*3/uL (ref 0.0–0.1)
Basophils Relative: 1 %
Eosinophils Absolute: 0.3 10*3/uL (ref 0.0–0.5)
Eosinophils Relative: 4 %
HCT: 45.1 % (ref 36.0–46.0)
Hemoglobin: 14.7 g/dL (ref 12.0–15.0)
Immature Granulocytes: 0 %
Lymphocytes Relative: 24 %
Lymphs Abs: 2.2 10*3/uL (ref 0.7–4.0)
MCH: 28.1 pg (ref 26.0–34.0)
MCHC: 32.6 g/dL (ref 30.0–36.0)
MCV: 86.2 fL (ref 80.0–100.0)
Monocytes Absolute: 0.6 10*3/uL (ref 0.1–1.0)
Monocytes Relative: 7 %
Neutro Abs: 5.8 10*3/uL (ref 1.7–7.7)
Neutrophils Relative %: 64 %
Platelets: 184 10*3/uL (ref 150–400)
RBC: 5.23 MIL/uL — ABNORMAL HIGH (ref 3.87–5.11)
RDW: 13.9 % (ref 11.5–15.5)
WBC: 9 10*3/uL (ref 4.0–10.5)
nRBC: 0 % (ref 0.0–0.2)

## 2024-02-18 LAB — TSH: TSH: 2.817 u[IU]/mL (ref 0.350–4.500)

## 2024-02-18 MED ORDER — HEPARIN SOD (PORK) LOCK FLUSH 100 UNIT/ML IV SOLN
500.0000 [IU] | Freq: Once | INTRAVENOUS | Status: AC | PRN
  Administered 2024-02-18: 500 [IU]
  Filled 2024-02-18: qty 5

## 2024-02-18 MED ORDER — SODIUM CHLORIDE 0.9 % IV SOLN
Freq: Once | INTRAVENOUS | Status: AC
  Filled 2024-02-18: qty 250

## 2024-02-18 MED ORDER — SODIUM CHLORIDE 0.9 % IV SOLN
200.0000 mg | Freq: Once | INTRAVENOUS | Status: AC
  Administered 2024-02-18: 200 mg via INTRAVENOUS
  Filled 2024-02-18: qty 8

## 2024-02-18 NOTE — Progress Notes (Signed)
 [facility] CONSULT NOTE  Patient Care Team: Malva Limes, MD as PCP - General (Family Medicine) Schnier, Latina Craver, MD (Vascular Surgery) Lonell Face, MD as Consulting Physician (Neurology) Delano Metz, MD as Referring Physician (Pain Medicine) [facility] (Optometry) Lady Gary, Darlin Priestly, MD as Consulting Physician (Cardiology) Pasty Spillers, MD (Inactive) as Consulting Physician (Gastroenterology) Nadara Mustard, MD as Referring Physician (Obstetrics and Gynecology) Glory Buff, RN as Oncology Nurse Navigator Earna Coder, MD as Consulting Physician (Oncology) Raechel Chute, MD as Consulting Physician (Pulmonary Disease)   CHIEF COMPLAINTS/PURPOSE OF CONSULTATION: lung cancer  #  Oncology History Overview Note  #MAY 2022- LLL nodule 2.3 cm Adenocarcinoma Stage IA; s/p lobectomy.  No adjuvant therapy  # CT scan 4th June 2023-highly concerning for recurrent/metastatic disease with-. New left adrenal metastasis;  Ground-glass and part solid nodules in the peripheral right lower lobe, unchanged from 11/07/2021 but slightly enlarged from 01/01/2018. Findings are suspicious for indolent adenocarcinoma.  F One- PFL1 =80%; KRAS G12C PET scan JUNE 20th- 2023-  Enlarged 4 cm hypermetabolic LEFT adrenal metastasis;  Suspicion of metastatic adenopathy to the LEFT hilum.  Part solid nodule in the RIGHT lower lobe without metabolic activity.   3 ADRENAL BIOPSY- CK7 POSITIVE ADENOCARCINOMA- There is limited tissue remaining for ancillary testing, not likely  sufficient for NGS testing.   # AUG 7th, 2023-single agent Keytruda.    Right mildly complex cystic adnexal mass noted-June 13th, 2024- Significant interval enlargement of a multiseptated cystic lesion of the right ovary, now measuring 11.7 x 8.2 cm, previously 8.1 x 5.8 cm.- s/p in [facility] [October, 2024]- mucinous cystadenoma - stable.   Primary cancer of left lower lobe of lung (HCC)   05/09/2021 Initial Diagnosis   Primary cancer of left lower lobe of lung (HCC)   07/04/2022 -  Chemotherapy   Patient is on Treatment Plan : LUNG NSCLC Pembrolizumab (200) q21d     07/10/2022 - 07/10/2022 Chemotherapy   Patient is on Treatment Plan : LUNG NSCLC Pembrolizumab (200) q21d      HISTORY OF PRESENTING ILLNESS: Patient  ambulating.  Alone.   Laura Nielsen 68 y.o.  female history of smoking; prior history of lung cancer-with likely recurrence to left adrenal gland [s/p Bx CK-7 positive TTF-1 negative]-clinically suggestive of recurrent lung cancer on single agent Rande Lawman is here for follow-up/a nd review the results if the CT scan.   Patient off therapy since in DEC 17th, is here follow up. Patient tearful for her kitty's loss.    Denies any abdominal pain. Otherwise patient continues to have mild shortness of breath on exertion. Complains of cramping in the legs chronic.   Review of Systems  Constitutional:  Negative for chills, diaphoresis, fever, malaise/fatigue and weight loss.  HENT:  Negative for nosebleeds and sore throat.   Eyes:  Negative for double vision.  Respiratory:  Negative for cough, hemoptysis, sputum production, shortness of breath and wheezing.   Cardiovascular:  Negative for palpitations, orthopnea and leg swelling.  Gastrointestinal:  Negative for abdominal pain, blood in stool, constipation, diarrhea, heartburn, melena, nausea and vomiting.  Genitourinary:  Negative for dysuria, frequency and urgency.  Musculoskeletal:  Positive for back pain and joint pain.  Skin: Negative.  Negative for itching and rash.  Neurological:  Positive for tingling. Negative for dizziness, focal weakness, weakness and headaches.  Endo/Heme/Allergies:  Does not bruise/bleed easily.  Psychiatric/Behavioral:  Negative for depression. The patient is not nervous/anxious and does not  have insomnia.      MEDICAL HISTORY:  Past Medical History:  Diagnosis Date   AAA (abdominal  aortic aneurysm) (HCC)    a.) s/p EVAR stent graft repair 03/27/2013   Adnexal mass    Aortic atherosclerosis (HCC)    Arthritis    Asthma    Atherosclerosis of native artery of both lower extremities with intermittent claudication (HCC)    B12 deficiency    Bilateral carotid artery stenosis    a.) doppler 05/06/2020 and 06/30/2021: 1-39% BICA; b.) doppler 11/20/2022: 60-79% RICA; c.) CTA neck 08/17/2023: >75% RICA, 30% LICA   CAD (coronary artery disease) 03/16/2009   a.) LHC/PCI 03/16/2009: 25% mLCx, 70% mRCA (3 x 18 mm Xience V DES); b.) MV 10/22/2017 and 03/16/2021: no ischemia; c.) MV 07/16/2023: mild apical ischemia   Cancer of left adrenal gland (HCC) 06/20/2022   a.) CNB 06/20/2022 -> pathology (+) for CK7 (TTF-1 -) adenocarcinoma   Centrilobular emphysema (HCC)    Cervical spinal stenosis    Cervical spondylosis with radiculopathy    Chronic back pain    Colon polyp    Complex ovarian cyst    Complication of anesthesia    a.) prone to experience postoperative hypotension   COPD (chronic obstructive pulmonary disease) (HCC)    DDD (degenerative disc disease), cervical    DDD (degenerative disc disease), thoracolumbar    Depression    Diabetes mellitus type 2, controlled (HCC)    Diastolic dysfunction 06/29/2023   a.) TTE 06/29/2023: EF >55%, no RWMAs, G1DD, triv PR, mild MR/TR   Fatty liver    GERD (gastroesophageal reflux disease)    Gout    History of bilateral cataract extraction    History of kidney stones    Hypercholesteremia    Long term (current) use of aspirin    Long term current use of clopidogrel    Lumbar spinal stenosis    Lumbar spondylosis    Non-small cell lung cancer metastatic to adrenal gland (HCC)    a.) s/p LLL wedge resection 04/06/2021 --> pathology (+) for well differentiated adenocarcinoma (stage 1A) --> no adjuvant Tx; b.) recurrent stage IV adenocarcinoma 05/2022 --> Tx'd with  pembrolizumab   Osteoarthritis    Osteoporosis    Primary  hypertension    Seasonal allergies    Sleep apnea    a.) no nocturnal PAP therapy since 2021   Vitamin D deficiency     SURGICAL HISTORY: Past Surgical History:  Procedure Laterality Date   ABDOMINAL AORTIC ENDOVASCULAR STENT GRAFT  03/27/2013   Dr. Levora Dredge   ABDOMINAL HYSTERECTOMY     Menometrorrhagia. Excessive bleeding. Unknown if cervix removed.    BILATERAL SALPINGOOPHORECTOMY  09/27/2023   CATARACT EXTRACTION W/PHACO Left 05/14/2023   Procedure: CATARACT EXTRACTION PHACO AND INTRAOCULAR LENS PLACEMENT (IOC) LEFT DIABETIC  OMIDRIA  19.40  01:34.8;  Surgeon: Estanislado Pandy, MD;  Location: [facility];  Service: Ophthalmology;  Laterality: Left;   CATARACT EXTRACTION W/PHACO Right 06/27/2023   Procedure: CATARACT EXTRACTION PHACO AND INTRAOCULAR LENS PLACEMENT (IOC) RIGHT DIABETIC  6.47  00:42.6;  Surgeon: Estanislado Pandy, MD;  Location: [facility];  Service: Ophthalmology;  Laterality: Right;   COLONOSCOPY WITH PROPOFOL N/A 01/05/2021   Procedure: COLONOSCOPY WITH PROPOFOL;  Surgeon: Pasty Spillers, MD;  Location: [facility] END[facility]COPY;  Service: Endoscopy;  Laterality: N/A;   CORONARY ANGIOPLASTY WITH STENT PLACEMENT  03/16/2009   Procedure: CORONARY ANGIOPLASTY WITH STENT PLACEMENT; Location: [facility]; Surgeons: Harold Hedge, MD (diagnostic) and  Lorine Bears, MD (interventional)   ENDARTERECTOMY Right 11/09/2023   Procedure: ENDARTERECTOMY CAROTID;  Surgeon: Renford Dills, MD;  Location: [facility] ORS;  Service: Vascular;  Laterality: Right;   ESOPHAGOGASTRODUODENOSCOPY (EGD) WITH PROPOFOL N/A 01/05/2021   Procedure: ESOPHAGOGASTRODUODENOSCOPY (EGD) WITH PROPOFOL;  Surgeon: Pasty Spillers, MD;  Location: [facility] END[facility]COPY;  Service: Endoscopy;  Laterality: N/A;   EXTRACORPOREAL SHOCK WAVE LITHOTRIPSY Right 1999   INTERCOSTAL NERVE BLOCK Left 04/06/2021   Procedure: INTERCOSTAL NERVE BLOCK;  Surgeon: Loreli Slot, MD;  Location: [facility] OR;   Service: Thoracic;  Laterality: Left;   IR IMAGING GUIDED PORT INSERTION  06/20/2022   LUNG LOBECTOMY Left 04/06/2021   robotic left lower lobectomy 04/06/2021 Dr. Dorris Fetch for Stage 1A adenocarcinoma   NODE DISSECTION Left 04/06/2021   Procedure: NODE DISSECTION;  Surgeon: Loreli Slot, MD;  Location: [facility] OR;  Service: Thoracic;  Laterality: Left;   TUBAL LIGATION      SOCIAL HISTORY: Social History   Socioeconomic History   Marital status: Divorced    Spouse name: Not on file   Number of children: 2   Years of education: H/S   Highest education level: 12th grade  Occupational History   Occupation: Disabled   Occupation: retired  Tobacco Use   Smoking status: Every Day    Current packs/day: 1.00    Average packs/day: 0.7 packs/day for 96.7 years (63.3 ttl pk-yrs)    Types: Cigarettes    Start date: 1973   Smokeless tobacco: Never   Tobacco comments:    12/23/19 states she quit for 9 months and then started back.   Vaping Use   Vaping status: Never Used  Substance and Sexual Activity   Alcohol use: Not Currently   Drug use: No   Sexual activity: Not on file  Other Topics Concern   Not on file  Social History Narrative   Hx of smoking; quit prior to lung surgery then started back. Lives in [place] alone. Used to work in [employer], [employer]- Facilities manager. On disability sec to spinal pain.    Social Drivers of Health   Financial Resource Strain: Medium Risk (11/26/2023)   Overall Financial Resource Strain (CARDIA)    Difficulty of Paying Living Expenses: Somewhat hard  Food Insecurity: No Food Insecurity (11/22/2023)   Hunger Vital Sign    Worried About Running Out of Food in the Last Year: Never true    Ran Out of Food in the Last Year: Never true  Transportation Needs: No Transportation Needs (11/22/2023)   PRAPARE - Administrator, Civil Service (Medical): No    Lack of Transportation (Non-Medical): No  Physical Activity: Unknown (11/26/2023)    Exercise Vital Sign    Days of Exercise per Week: 0 days    Minutes of Exercise per Session: Patient declined  Stress: Stress Concern Present (11/26/2023)   [employer] of Occupational Health - Occupational Stress Questionnaire    Feeling of Stress : Very much  Social Connections: Socially Isolated (11/26/2023)   Social Connection and Isolation Panel [NHANES]    Frequency of Communication with Friends and Family: More than three times a week    Frequency of Social Gatherings with Friends and Family: Once a week    Attends Religious Services: Never    Database administrator or Organizations: No    Attends Banker Meetings: Never    Marital Status: Divorced  Catering manager Violence: Not At Risk (11/09/2023)   Humiliation, Afraid, Rape, and Kick  questionnaire    Fear of Current or Ex-Partner: No    Emotionally Abused: No    Physically Abused: No    Sexually Abused: No    FAMILY HISTORY: Family History  Problem Relation Age of Onset   Hypertension Mother    Coronary artery disease Mother    Heart attack Mother        acute   Cancer Mother    Alcohol abuse Father    Depression Father    Hypertension Father    Heart attack Father 85       acute   Alcohol abuse Sister    Hyperlipidemia Sister    Hypertension Sister    Cancer Sister 88   Heart attack Sister        x's 2   Coronary artery disease Sister 45       x's 2   Breast cancer Sister 62    ALLERGIES:  is allergic to atorvastatin, omeprazole, and bupropion.  MEDICATIONS:  Current Outpatient Medications  Medication Sig Dispense Refill   acetaminophen (TYLENOL) 325 MG tablet Take 1-2 tablets (325-650 mg total) by mouth every 4 (four) hours as needed for mild pain (pain score 1-3) or moderate pain (pain score 4-6) (or temp >/= 101 F).     acetaminophen (TYLENOL) 500 MG tablet Take 500 mg by mouth every 6 (six) hours as needed for moderate pain (pain score 4-6).     allopurinol (ZYLOPRIM) 100 MG  tablet Take 1 tablet (100 mg total) by mouth daily. 90 tablet 4   aspirin 81 MG tablet Take 81 mg by mouth daily.      bisoprolol (ZEBETA) 10 MG tablet Take 1 tablet (10 mg total) by mouth daily. 90 tablet 0   Budeson-Glycopyrrol-Formoterol (BREZTRI AEROSPHERE) 160-9-4.8 MCG/ACT AERO Inhale 2 puffs into the lungs in the morning and at bedtime. 1284 g 11   buPROPion (WELLBUTRIN XL) 150 MG 24 hr tablet Take 1 tablet (150 mg total) by mouth daily. 30 tablet 3   clopidogrel (PLAVIX) 75 MG tablet Take 1 tablet (75 mg total) by mouth daily. 90 tablet 0   ibuprofen (ADVIL) 200 MG tablet Take 200 mg by mouth 2 (two) times daily as needed (headaches).     lidocaine-prilocaine (EMLA) cream Apply on the port. 30 -45 min  prior to port access. 30 g 3   lisinopril-hydrochlorothiazide (ZESTORETIC) 20-12.5 MG tablet Take 1 tablet by mouth daily. (Patient taking differently: Take 0.5 tablets by mouth daily. 1 tab alternating with 1/2 tab) 90 tablet 0   magnesium gluconate (MAGONATE) 500 MG tablet Take 500 mg by mouth at bedtime.     pantoprazole (PROTONIX) 20 MG tablet Take 1 tablet (20 mg total) by mouth daily. 90 tablet 4   Polyethyl Glyc-Propyl Glyc PF (SYSTANE PRESERVATIVE FREE) 0.4-0.3 % SOLN Place 1 drop into both eyes daily as needed (dry eyes).     PROAIR HFA 108 (90 Base) MCG/ACT inhaler Inhale 2 puffs into the lungs every 6 (six) hours as needed for wheezing or shortness of breath. 8.5 g 4   rosuvastatin (CRESTOR) 20 MG tablet Take 1 tablet (20 mg total) by mouth daily. 90 tablet 4   Semaglutide, 1 MG/DOSE, (OZEMPIC, 1 MG/DOSE,) 4 MG/3ML SOPN Inject 1 mg into the skin once a week. 9 mL 1   oxyCODONE (OXY IR/ROXICODONE) 5 MG immediate release tablet Take 1-2 tablets (5-10 mg total) by mouth every 4 (four) hours as needed for moderate pain (pain score 4-6). (Patient not  taking: Reported on 02/18/2024) 30 tablet 0   No current facility-administered medications for this visit.   Facility-Administered  Medications Ordered in Other Visits  Medication Dose Route Frequency Provider Last Rate Last Admin   heparin lock flush 100 UNIT/ML injection             Mild maculopapular rash on the Maller area left more than right.  PHYSICAL EXAMINATION: ECOG PERFORMANCE STATUS: 1 - Symptomatic but completely ambulatory  Vitals:   02/18/24 0827  BP: 135/81  Pulse: 74  Resp: 16  Temp: (!) 97.4 F (36.3 C)  SpO2: 99%       Filed Weights   02/18/24 0827  Weight: 189 lb 3.2 oz (85.8 kg)        Physical Exam HENT:     Head: Normocephalic and atraumatic.     Mouth/Throat:     Pharynx: No oropharyngeal exudate.  Eyes:     Pupils: Pupils are equal, round, and reactive to light.  Cardiovascular:     Rate and Rhythm: Normal rate and regular rhythm.  Pulmonary:     Comments: Decreased breath sounds bilaterally.  No wheeze or crackles Abdominal:     General: Bowel sounds are normal. There is no distension.     Palpations: Abdomen is soft. There is no mass.     Tenderness: There is no abdominal tenderness. There is no guarding or rebound.  Musculoskeletal:        General: No tenderness. Normal range of motion.     Cervical back: Normal range of motion and neck supple.  Skin:    General: Skin is warm.  Neurological:     Mental Status: She is alert and oriented to person, place, and time.  Psychiatric:        Mood and Affect: Affect normal.      LABORATORY DATA:  I have reviewed the data as listed Lab Results  Component Value Date   WBC 9.0 02/18/2024   HGB 14.7 02/18/2024   HCT 45.1 02/18/2024   MCV 86.2 02/18/2024   PLT 184 02/18/2024   Recent Labs    11/20/23 0846 01/21/24 0945 02/18/24 0808  NA 140 137 140  K 3.8 3.7 3.4*  CL 107 103 107  CO2 25 25 27   GLUCOSE 91 94 97  BUN 15 21 17   CREATININE 0.64 0.70 0.64  CALCIUM 9.4 9.1 9.3  GFRNONAA >60 >60 >60  PROT 6.7 7.0 6.5  ALBUMIN 3.5 3.9 3.6  AST 20 20 18   ALT 15 17 14   ALKPHOS 78 102 96  BILITOT 0.6 0.7  0.7    RADIOGRAPHIC STUDIES: I have personally reviewed the radiological images as listed and agreed with the findings in the report. CT CHEST W CONTRAST Result Date: 02/15/2024 CLINICAL DATA:  Non-small cell lung cancer with recurrence. * Tracking Code: BO * EXAM: CT CHEST WITH CONTRAST TECHNIQUE: Multidetector CT imaging of the chest was performed during intravenous contrast administration. RADIATION DOSE REDUCTION: This exam was performed according to the departmental dose-optimization program which includes automated exposure control, adjustment of the mA and/or kV according to patient size and/or use of iterative reconstruction technique. CONTRAST:  75mL OMNIPAQUE IOHEXOL 300 MG/ML  SOLN COMPARISON:  11/05/2023. FINDINGS: Cardiovascular: Right IJ Port-A-Cath terminates in the high right atrium. Atherosclerotic calcification of the aorta and coronary arteries. Heart is at the upper limits of normal in size. Left ventricle appears hypertrophied. No pericardial effusion. Mediastinum/Nodes: No pathologically enlarged mediastinal, hilar or axillary lymph nodes. Esophagus  is grossly unremarkable. Lungs/Pleura: Centrilobular and paraseptal emphysema. Scattered pulmonary parenchymal scarring. Similar atypical pulmonary cysts with ground-glass components in the posterior right lower lobe, measuring 1.2 cm (4/97) and 1.3 cm (4/101), respectively. No solid components. Left lower lobectomy. No pleural fluid. Airway is otherwise unremarkable. Upper Abdomen: Visualized portions of the liver, gallbladder, adrenal glands, kidneys, spleen, pancreas, stomach and bowel are grossly unremarkable. No upper abdominal adenopathy. Partially imaged endograft stent in the aorta. Musculoskeletal: Degenerative changes in the spine. IMPRESSION: 1. No evidence of recurrent or metastatic disease. 2. Ground-glass atypical pulmonary cysts in the right lower lobe, stable. Recommend continued attention on follow-up as adenocarcinoma cannot  be excluded. 3. Aortic atherosclerosis (ICD10-I70.0). Coronary artery calcification. 4.  Emphysema (ICD10-J43.9). Electronically Signed   By: Leanna Battles M.D.   On: 02/15/2024 11:43      ASSESSMENT & PLAN:   Primary cancer of left lower lobe of lung (HCC) #JUNE 2023-STAGE IV--recurrent/metastatic disease with New left adrenal metastasis;  PET scan JUNE 20th- 2023-  Enlarged 4 cm hypermetabolic LEFT adrenal metastasis;  Suspicion of metastatic adenopathy to the LEFT hilum.  Part solid nodule in the RIGHT lower lobe without metabolic activity. 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 Currently on single agent Keytruda [PD-L1 greater than 80%].   MARCH 3rd, 2025- CT CAP-  No evidence of recurrent or metastatic disease.  Ground-glass atypical pulmonary cysts in the right lower lobe, stable. Recommend continued attention on follow-up as adenocarcinoma cannot be excluded.  # DEC 17th, 2024- last Keytruda.  Given MARCH 3rd, 2-25- CT scans negative for any recurrent malignancy. Continue with [place] immunotherapy [until aug 2025 x2 years]  # Mild Hypokalemia: discussed dietary supplement.stable  # Chronic pain: headaches/cervical pain/back pain [Dr.Naveira; pain doctor]  On NSAIDs [ monitor for now. OCT 2023- MRI brain- NEGATIVE for any metastatic disease.   Stable.  FEB 2024- MRI lumbar spine- degenerative disease- on ibuprofen prn- BID.  stable  # Left rib pain-Post throacotomy pain/tingling and numbness:  Continue gabapentin -300 mg TID; and then at extra at night prn. stable  # CAD/ PVD- cramping- s/p evaluation [Dr.Schneir]; ? Carotid occlusion- s/p CEA surgery- stable  # COPD-stable encouraged continue to avoid smoking; again counseled to quit smoking; s/p pulmonary;stable  # IV Access :s/p  port placement stable  #Incidental findings on Imaging  CT , 2025: Aortic  atherosclerosis;  Coronary artery calcification; emphysema I reviewed/discussed/counseled the patient.   *AM appts- Monday- con-CT  # DISPOSITION:  #  keytruda today # follow up in 3 weeks- -MD labs/port- cbc/cmp;TSH; possible Keytruda;   # follow up in 6 weeks- -MD labs/port- cbc/cmp;TSH; possible Keytruda;Dr. B  # I reviewed the blood work- with the patient in detail; also reviewed the imaging independently [as summarized above]; and with the patient in detail.        Earna Coder, MD 02/18/2024 9:07 AM

## 2024-02-18 NOTE — Progress Notes (Signed)
 CT chest 02/04/24.

## 2024-02-18 NOTE — Assessment & Plan Note (Addendum)
#  JUNE 2023-STAGE IV--recurrent/metastatic disease with New left adrenal metastasis;  PET scan JUNE 20th- 2023-  Enlarged 4 cm hypermetabolic LEFT adrenal metastasis;  Suspicion of metastatic adenopathy to the LEFT hilum.  Part solid nodule in the RIGHT lower lobe without metabolic activity. 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 Currently on single agent Keytruda [PD-L1 greater than 80%].   MARCH 3rd, 2025- CT CAP-  No evidence of recurrent or metastatic disease.  Ground-glass atypical pulmonary cysts in the right lower lobe, stable. Recommend continued attention on follow-up as adenocarcinoma cannot be excluded.  # DEC 17th, 2024- last Keytruda.  Given MARCH 3rd, 2-25- CT scans negative for any recurrent malignancy. Continue with [place] immunotherapy [until aug 2025 x2 years]  # Mild Hypokalemia: discussed dietary supplement.stable  # Chronic pain: headaches/cervical pain/back pain [Dr.Naveira; pain doctor]  On NSAIDs [ monitor for now. OCT 2023- MRI brain- NEGATIVE for any metastatic disease.   Stable.  FEB 2024- MRI lumbar spine- degenerative disease- on ibuprofen prn- BID.  stable  # Left rib pain-Post throacotomy pain/tingling and numbness:  Continue gabapentin -300 mg TID; and then at extra at night prn. stable  # CAD/ PVD- cramping- s/p evaluation [Dr.Schneir]; ? Carotid occlusion- s/p CEA surgery- stable  # COPD-stable encouraged continue to avoid smoking; again counseled to quit smoking; s/p pulmonary;stable  # IV Access :s/p  port placement stable  #Incidental findings on Imaging  CT , 2025: Aortic atherosclerosis;  Coronary artery calcification; emphysema I reviewed/discussed/counseled the patient.   *AM appts- Monday- con-CT  # DISPOSITION:  #  keytruda today # follow up in 3 weeks- -MD labs/port- cbc/cmp;TSH; possible Keytruda;   # follow up in 6 weeks- -MD  labs/port- cbc/cmp;TSH; possible Keytruda;Dr. B  # I reviewed the blood work- with the patient in detail; also reviewed the imaging independently [as summarized above]; and with the patient in detail.

## 2024-02-19 LAB — T4: T4, Total: 7.1 ug/dL (ref 4.5–12.0)

## 2024-02-20 ENCOUNTER — Ambulatory Visit (INDEPENDENT_AMBULATORY_CARE_PROVIDER_SITE_OTHER): Payer: Medicare Other | Admitting: Emergency Medicine

## 2024-02-20 VITALS — Ht 68.0 in | Wt 188.0 lb

## 2024-02-20 DIAGNOSIS — Z0000 Encounter for general adult medical examination without abnormal findings: Secondary | ICD-10-CM | POA: Diagnosis not present

## 2024-02-20 NOTE — Patient Instructions (Addendum)
 [name] , Thank you for taking time to come for your Medicare Wellness Visit. I appreciate your ongoing commitment to your health goals. Please review the following plan we discussed and let me know if I can assist you in the future.   Referrals/Orders/Follow-Ups/Clinician Recommendations: You will be due for a diabetic eye exam 03/28/24. Call to schedule this at your earliest convenience.  This is a list of the screening recommended for you and due dates:  Health Maintenance  Topic Date Due   Zoster (Shingles) Vaccine (1 of 2) Never done   Yearly kidney health urinalysis for diabetes  12/12/2018   Colon Cancer Screening  01/05/2022   COVID-19 Vaccine (4 - 2024-25 season) 08/05/2023   Flu Shot  03/03/2024*   Eye exam for diabetics  03/26/2024   Hemoglobin A1C  05/09/2024   Yearly kidney function blood test for diabetes  02/17/2025   Medicare Annual Wellness Visit  02/19/2025   Mammogram  03/04/2025   DEXA scan (bone density measurement)  04/06/2027   DTaP/Tdap/Td vaccine (2 - Td or Tdap) 08/31/2027   Pneumonia Vaccine  Completed   Hepatitis C Screening  Completed   HPV Vaccine  Aged Out   Cologuard (Stool DNA test)  Discontinued  *Topic was postponed. The date shown is not the original due date.    Advanced directives: (Declined) Advance directive discussed with you today. Even though you declined this today, please call our office should you change your mind, and we can give you the proper paperwork for you to fill out.  Next Medicare Annual Wellness Visit scheduled for next year: Yes, 02/25/25 @ 8:10am (phone visit)

## 2024-02-20 NOTE — Progress Notes (Signed)
 Subjective:   Laura Nielsen is a 68 y.o. who presents for a Medicare Wellness preventive visit.  Visit Complete: Virtual I connected with  Laura Nielsen on 02/20/24 by a audio enabled telemedicine application and verified that I am speaking with the correct person using two identifiers.  Patient Location: Home  Provider Location: Home Office  I discussed the limitations of evaluation and management by telemedicine. The patient expressed understanding and agreed to proceed.  Vital Signs: Because this visit was a virtual/telehealth visit, some criteria may be missing or patient reported. Any vitals not documented were not able to be obtained and vitals that have been documented are patient reported.  VideoDeclined- This patient declined Librarian, academic. Therefore the visit was completed with audio only.  Persons Participating in Visit: Patient.  AWV Questionnaire: No: Patient Medicare AWV questionnaire was not completed prior to this visit.  Cardiac Risk Factors include: advanced age (>55men, >62 women);diabetes mellitus;dyslipidemia;hypertension;sedentary lifestyle;Other (see comment);smoking/ tobacco exposure, Risk factor comments: CAD, OSA (no cpap)     Objective:    Today's Vitals   02/20/24 0853  Weight: 188 lb (85.3 kg)  Height: 5\' 8"  (1.727 m)  PainSc: 4    Body mass index is 28.59 kg/m.     02/20/2024    9:10 AM 02/18/2024    8:07 AM 01/21/2024    9:57 AM 11/20/2023    9:00 AM 11/20/2023    8:34 AM 11/09/2023    6:37 AM 11/07/2023    3:21 PM  Advanced Directives  Does Patient Have a Medical Advance Directive? No No No No No No No  Would patient like information on creating a medical advance directive? No - Patient declined No - Patient declined No - Patient declined No - Patient declined No - Patient declined No - Patient declined No - Patient declined    Current Medications (verified) Outpatient Encounter Medications as  of 02/20/2024  Medication Sig   acetaminophen (TYLENOL) 500 MG tablet Take 500 mg by mouth every 6 (six) hours as needed for moderate pain (pain score 4-6).   allopurinol (ZYLOPRIM) 100 MG tablet Take 1 tablet (100 mg total) by mouth daily.   aspirin 81 MG tablet Take 81 mg by mouth daily.    bisoprolol (ZEBETA) 10 MG tablet Take 1 tablet (10 mg total) by mouth daily.   Budeson-Glycopyrrol-Formoterol (BREZTRI AEROSPHERE) 160-9-4.8 MCG/ACT AERO Inhale 2 puffs into the lungs in the morning and at bedtime.   clopidogrel (PLAVIX) 75 MG tablet Take 1 tablet (75 mg total) by mouth daily.   ibuprofen (ADVIL) 200 MG tablet Take 200 mg by mouth 2 (two) times daily as needed (headaches).   lidocaine-prilocaine (EMLA) cream Apply on the port. 30 -45 min  prior to port access.   lisinopril-hydrochlorothiazide (ZESTORETIC) 20-12.5 MG tablet Take 1 tablet by mouth daily. (Patient taking differently: Take 0.5 tablets by mouth daily. 1 tab alternating with 1/2 tab)   magnesium gluconate (MAGONATE) 500 MG tablet Take 500 mg by mouth at bedtime.   pantoprazole (PROTONIX) 20 MG tablet Take 1 tablet (20 mg total) by mouth daily.   Polyethyl Glyc-Propyl Glyc PF (SYSTANE PRESERVATIVE FREE) 0.4-0.3 % SOLN Place 1 drop into both eyes daily as needed (dry eyes).   PROAIR HFA 108 (90 Base) MCG/ACT inhaler Inhale 2 puffs into the lungs every 6 (six) hours as needed for wheezing or shortness of breath.   rosuvastatin (CRESTOR) 20 MG tablet Take 1 tablet (20 mg total) by mouth  daily.   Semaglutide, 1 MG/DOSE, (OZEMPIC, 1 MG/DOSE,) 4 MG/3ML SOPN Inject 1 mg into the skin once a week.   acetaminophen (TYLENOL) 325 MG tablet Take 1-2 tablets (325-650 mg total) by mouth every 4 (four) hours as needed for mild pain (pain score 1-3) or moderate pain (pain score 4-6) (or temp >/= 101 F). (Patient not taking: Reported on 02/20/2024)   buPROPion (WELLBUTRIN XL) 150 MG 24 hr tablet Take 1 tablet (150 mg total) by mouth daily. (Patient not  taking: Reported on 02/20/2024)   oxyCODONE (OXY IR/ROXICODONE) 5 MG immediate release tablet Take 1-2 tablets (5-10 mg total) by mouth every 4 (four) hours as needed for moderate pain (pain score 4-6). (Patient not taking: Reported on 01/21/2024)   Facility-Administered Encounter Medications as of 02/20/2024  Medication   heparin lock flush 100 UNIT/ML injection    Allergies (verified) Atorvastatin, Omeprazole, and Bupropion   History: Past Medical History:  Diagnosis Date   AAA (abdominal aortic aneurysm) (HCC)    a.) s/p EVAR stent graft repair 03/27/2013   Adnexal mass    Aortic atherosclerosis (HCC)    Arthritis    Asthma    Atherosclerosis of native artery of both lower extremities with intermittent claudication (HCC)    B12 deficiency    Bilateral carotid artery stenosis    a.) doppler 05/06/2020 and 06/30/2021: 1-39% BICA; b.) doppler 11/20/2022: 60-79% RICA; c.) CTA neck 08/17/2023: >75% RICA, 30% LICA   CAD (coronary artery disease) 03/16/2009   a.) LHC/PCI 03/16/2009: 25% mLCx, 70% mRCA (3 x 18 mm Xience V DES); b.) MV 10/22/2017 and 03/16/2021: no ischemia; c.) MV 07/16/2023: mild apical ischemia   Cancer of left adrenal gland (HCC) 06/20/2022   a.) CNB 06/20/2022 -> pathology (+) for CK7 (TTF-1 -) adenocarcinoma   Centrilobular emphysema (HCC)    Cervical spinal stenosis    Cervical spondylosis with radiculopathy    Chronic back pain    Colon polyp    Complex ovarian cyst    Complication of anesthesia    a.) prone to experience postoperative hypotension   COPD (chronic obstructive pulmonary disease) (HCC)    DDD (degenerative disc disease), cervical    DDD (degenerative disc disease), thoracolumbar    Depression    Diabetes mellitus type 2, controlled (HCC)    Diastolic dysfunction 06/29/2023   a.) TTE 06/29/2023: EF >55%, no RWMAs, G1DD, triv PR, mild MR/TR   Fatty liver    GERD (gastroesophageal reflux disease)    Gout    History of bilateral cataract  extraction    History of kidney stones    Hypercholesteremia    Long term (current) use of aspirin    Long term current use of clopidogrel    Lumbar spinal stenosis    Lumbar spondylosis    Non-small cell lung cancer metastatic to adrenal gland (HCC)    a.) s/p LLL wedge resection 04/06/2021 --> pathology (+) for well differentiated adenocarcinoma (stage 1A) --> no adjuvant Tx; b.) recurrent stage IV adenocarcinoma 05/2022 --> Tx'd with  pembrolizumab   Osteoarthritis    Osteoporosis    Primary hypertension    Seasonal allergies    Sleep apnea    a.) no nocturnal PAP therapy since 2021   Vitamin D deficiency    Past Surgical History:  Procedure Laterality Date   ABDOMINAL AORTIC ENDOVASCULAR STENT GRAFT  03/27/2013   Dr. Levora Dredge   ABDOMINAL HYSTERECTOMY     Menometrorrhagia. Excessive bleeding. Unknown if cervix removed.  BILATERAL SALPINGOOPHORECTOMY  09/27/2023   CATARACT EXTRACTION W/PHACO Left 05/14/2023   Procedure: CATARACT EXTRACTION PHACO AND INTRAOCULAR LENS PLACEMENT (IOC) LEFT DIABETIC  OMIDRIA  19.40  01:34.8;  Surgeon: Estanislado Pandy, MD;  Location[facility];  Service: Ophthalmology;  Laterality: Left;   CATARACT EXTRACTION W/PHACO Right 06/27/2023   Procedure: CATARACT EXTRACTION PHACO AND INTRAOCULAR LENS PLACEMENT (IOC) RIGHT DIABETIC  6.47  00:42.6;  Surgeon: Estanislado Pandy, MD;  Location: [facility];  Service: Ophthalmology;  Laterality: Right;   COLONOSCOPY WITH PROPOFOL N/A 01/05/2021   Procedure: COLONOSCOPY WITH PROPOFOL;  Surgeon: Pasty Spillers, MD;  Location: [facility] ENDOSCOPY;  Service: Endoscopy;  Laterality: N/A;   CORONARY ANGIOPLASTY WITH STENT PLACEMENT  03/16/2009   Procedure: CORONARY ANGIOPLASTY WITH STENT PLACEMENT; Location: [facility]; Surgeons: Harold Hedge, MD (diagnostic) and Lorine Bears, MD (interventional)   ENDARTERECTOMY Right 11/09/2023   Procedure: ENDARTERECTOMY CAROTID;  Surgeon: Renford Dills, MD;  Location: [facility] ORS;  Service: Vascular;  Laterality: Right;   ESOPHAGOGASTRODUODENOSCOPY (EGD) WITH PROPOFOL N/A 01/05/2021   Procedure: ESOPHAGOGASTRODUODENOSCOPY (EGD) WITH PROPOFOL;  Surgeon: Pasty Spillers, MD;  Location: [facility] ENDOSCOPY;  Service: Endoscopy;  Laterality: N/A;   EXTRACORPOREAL SHOCK WAVE LITHOTRIPSY Right 1999   INTERCOSTAL NERVE BLOCK Left 04/06/2021   Procedure: INTERCOSTAL NERVE BLOCK;  Surgeon: Loreli Slot, MD;  Location: [facility] OR;  Service: Thoracic;  Laterality: Left;   IR IMAGING GUIDED PORT INSERTION  06/20/2022   LUNG LOBECTOMY Left 04/06/2021   robotic left lower lobectomy 04/06/2021 Dr. Dorris Fetch for Stage 1A adenocarcinoma   NODE DISSECTION Left 04/06/2021   Procedure: NODE DISSECTION;  Surgeon: Loreli Slot, MD;  Location: [facility] OR;  Service: Thoracic;  Laterality: Left;   TUBAL LIGATION     Family History  Problem Relation Age of Onset   Hypertension Mother    Coronary artery disease Mother    Heart attack Mother        acute   Cancer Mother    Alcohol abuse Father    Depression Father    Hypertension Father    Heart attack Father 7       acute   Alcohol abuse Sister    Hyperlipidemia Sister    Hypertension Sister    Cancer Sister 39   Heart attack Sister        x's 2   Coronary artery disease Sister 6       x's 2   Breast cancer Sister 74   Social History   Socioeconomic History   Marital status: Divorced    Spouse name: Not on file   Number of children: 2   Years of education: H/S   Highest education level: 12th grade  Occupational History   Occupation: Disabled   Occupation: retired  Tobacco Use   Smoking status: Every Day    Current packs/day: 1.00    Average packs/day: 1 pack/day for 51.2 years (51.2 ttl pk-yrs)    Types: Cigarettes    Start date: 1974   Smokeless tobacco: Never   Tobacco comments:    12/23/19 states she quit for 9 months and then started back.     02/20/24 1/2 ppd   Vaping Use   Vaping status: Never Used  Substance and Sexual Activity   Alcohol use: Not Currently   Drug use: No   Sexual activity: Not on file  Other Topics Concern   Not on file  Social History Narrative   Hx of smoking; quit prior  to lung surgery then started back. Lives in [place] alone. Used to work in [employer], [employer]- Facilities manager. On disability sec to spinal pain.    Social Drivers of Health   Financial Resource Strain: Medium Risk (02/20/2024)   Overall Financial Resource Strain (CARDIA)    Difficulty of Paying Living Expenses: Somewhat hard  Food Insecurity: No Food Insecurity (02/20/2024)   Hunger Vital Sign    Worried About Running Out of Food in the Last Year: Never true    Ran Out of Food in the Last Year: Never true  Transportation Needs: No Transportation Needs (02/20/2024)   PRAPARE - Administrator, Civil Service (Medical): No    Lack of Transportation (Non-Medical): No  Physical Activity: Inactive (02/20/2024)   Exercise Vital Sign    Days of Exercise per Week: 0 days    Minutes of Exercise per Session: 0 min  Stress: Stress Concern Present (02/20/2024)   [employer] of Occupational Health - Occupational Stress Questionnaire    Feeling of Stress : To some extent  Social Connections: Socially Isolated (02/20/2024)   Social Connection and Isolation Panel [NHANES]    Frequency of Communication with Friends and Family: More than three times a week    Frequency of Social Gatherings with Friends and Family: Once a week    Attends Religious Services: Never    Database administrator or Organizations: No    Attends Banker Meetings: Never    Marital Status: Divorced    Tobacco Counseling Ready to quit: Not Answered Counseling given: Not Answered Tobacco comments: 12/23/19 states she quit for 9 months and then started back.  02/20/24 1/2 ppd    Clinical Intake:  Pre-visit preparation completed: Yes  Pain : 0-10 Pain Score: 4  Pain  Type: Chronic pain Pain Location: Head Pain Descriptors / Indicators: Headache     BMI - recorded: 28.59 Nutritional Status: BMI 25 -29 Overweight Nutritional Risks: None Diabetes: Yes CBG done?: No Did pt. bring in CBG monitor from home?: No  Lab Results  Component Value Date   HGBA1C 5.4 11/09/2023   HGBA1C 5.9 (A) 02/13/2023   HGBA1C 5.7 (A) 08/14/2022     How often do you need to have someone help you when you read instructions, pamphlets, or other written materials from your doctor or pharmacy?: 1 - Never  Interpreter Needed?: No  Information entered by :: Tora Kindred, CMA   Activities of Daily Living     02/20/2024    8:56 AM 02/16/2024    8:50 PM  In your present state of health, do you have any difficulty performing the following activities:  Hearing? 0 0  Vision? 0 0  Difficulty concentrating or making decisions? 1 1  Comment sometimes concentration   Walking or climbing stairs? 1 1  Comment due to chronic back pain   Dressing or bathing? 0 0  Doing errands, shopping? 0 0  Preparing Food and eating ? N N  Using the Toilet? N N  In the past six months, have you accidently leaked urine? Y Y  Do you have problems with loss of bowel control? N N  Managing your Medications? N N  Managing your Finances? N N  Housekeeping or managing your Housekeeping? N N    Patient Care Team: Malva Limes, MD as PCP - General (Family Medicine) Schnier, Latina Craver, MD (Vascular Surgery) Lonell Face, MD as Consulting Physician (Neurology) Delano Metz, MD as Referring Physician (Pain Medicine) Pa,  [facility] (Optometry) Fath, Darlin Priestly, MD as Consulting Physician (Cardiology) Pasty Spillers, MD (Inactive) as Consulting Physician (Gastroenterology) Nadara Mustard, MD as Referring Physician (Obstetrics and Gynecology) Glory Buff, RN as Oncology Nurse Navigator Earna Coder, MD as Consulting Physician (Oncology) Raechel Chute, MD as  Consulting Physician (Pulmonary Disease)  Indicate any recent Medical Services you may have received from other than [facility] providers in the past year (date may be approximate).     Assessment:   This is a routine wellness examination for Laura Nielsen.  Hearing/Vision screen Hearing Screening - Comments:: Denies hearing loss Vision Screening - Comments:: Gets DM eye exams, [facility]   Goals Addressed             This Visit's Progress    Quit Smoking         Depression Screen     02/20/2024    9:06 AM 11/26/2023   10:52 AM 02/20/2023    8:55 AM 02/13/2023    8:46 AM 08/14/2022    9:54 AM 03/21/2022    4:09 PM 02/13/2022    9:13 AM  PHQ 2/9 Scores  PHQ - 2 Score 2 4 3 3 3 1 4   PHQ- 9 Score 5 11 11 11 11  11     Fall Risk     02/20/2024    9:11 AM 02/16/2024    8:50 PM 11/26/2023   10:52 AM 02/16/2023    9:08 AM 02/13/2023    8:46 AM  Fall Risk   Falls in the past year? 0 0 0 0 0  Number falls in past yr: 0  0 0 0  Injury with Fall? 0  0 0 0  Risk for fall due to : No Fall Risks  No Fall Risks No Fall Risks   Follow up Falls prevention discussed;Falls evaluation completed   Education provided;Falls prevention discussed     MEDICARE RISK AT HOME:  Medicare Risk at Home Any stairs in or around the home?: Yes If so, are there any without handrails?: No Home free of loose throw rugs in walkways, pet beds, electrical cords, etc?: Yes Adequate lighting in your home to reduce risk of falls?: Yes Life alert?: No Use of a cane, walker or w/c?: No Grab bars in the bathroom?: No Shower chair or bench in shower?: Yes Elevated toilet seat or a handicapped toilet?: No  TIMED UP AND GO:  Was the test performed?  No  Cognitive Function: 6CIT completed        02/20/2024    9:11 AM 02/20/2023    9:03 AM  6CIT Screen  What Year? 0 points 0 points  What month? 0 points 0 points  What time? 0 points 0 points  Count back from 20 0 points 0 points  Months in reverse  0 points 0 points  Repeat phrase 0 points 2 points  Total Score 0 points 2 points    Immunizations Immunization History  Administered Date(s) Administered   Influenza,inj,Quad PF,6+ Mos 09/03/2020   Influenza-Unspecified 10/10/2021   PFIZER(Purple Top)SARS-COV-2 Vaccination 02/20/2020, 03/19/2020, 11/30/2020   PNEUMOCOCCAL CONJUGATE-20 02/13/2022   Pneumococcal Polysaccharide-23 09/25/2016   Tdap 08/30/2017    Screening Tests Health Maintenance  Topic Date Due   Zoster Vaccines- Shingrix (1 of 2) Never done   Diabetic kidney evaluation - Urine ACR  12/12/2018   Colonoscopy  01/05/2022   COVID-19 Vaccine (4 - 2024-25 season) 08/05/2023   INFLUENZA VACCINE  03/03/2024 (Originally 07/05/2023)  OPHTHALMOLOGY EXAM  03/26/2024   HEMOGLOBIN A1C  05/09/2024   Diabetic kidney evaluation - eGFR measurement  02/17/2025   Medicare Annual Wellness (AWV)  02/19/2025   MAMMOGRAM  03/04/2025   DEXA SCAN  04/06/2027   DTaP/Tdap/Td (2 - Td or Tdap) 08/31/2027   Pneumonia Vaccine 87+ Years old  Completed   Hepatitis C Screening  Completed   HPV VACCINES  Aged Out   Fecal DNA (Cologuard)  Discontinued    Health Maintenance  Health Maintenance Due  Topic Date Due   Zoster Vaccines- Shingrix (1 of 2) Never done   Diabetic kidney evaluation - Urine ACR  12/12/2018   Colonoscopy  01/05/2022   COVID-19 Vaccine (4 - 2024-25 season) 08/05/2023   Health Maintenance Items Addressed: See Nurse Notes  Additional Screening:  Vision Screening: Recommended annual ophthalmology exams for early detection of glaucoma and other disorders of the eye.  Dental Screening: Recommended annual dental exams for proper oral hygiene  Community Resource Referral / Chronic Care Management: CRR required this visit?  No   CCM required this visit?  No     Plan:     I have personally reviewed and noted the following in the patient's chart:   Medical and social history Use of alcohol, tobacco or illicit  drugs  Current medications and supplements including opioid prescriptions. Patient is not currently taking opioid prescriptions. Functional ability and status Nutritional status Physical activity Advanced directives List of other physicians Hospitalizations, surgeries, and ER visits in previous 12 months Vitals Screenings to include cognitive, depression, and falls Referrals and appointments  In addition, I have reviewed and discussed with patient certain preventive protocols, quality metrics, and best practice recommendations. A written personalized care plan for preventive services as well as general preventive health recommendations were provided to patient.     Tora Kindred, CMA   02/20/2024   After Visit Summary: (MyChart) Due to this being a telephonic visit, the after visit summary with patients personalized plan was offered to patient via MyChart   Notes:  MMG scheduled for 03/05/24 Due for DM eye exam 03/27/24 Declined DM & Nutrition education Declined shingles vaccine at this time Declined covid vaccine Declined colonoscopy

## 2024-02-21 ENCOUNTER — Other Ambulatory Visit: Payer: Self-pay

## 2024-02-26 ENCOUNTER — Other Ambulatory Visit: Payer: Self-pay | Admitting: *Deleted

## 2024-02-26 ENCOUNTER — Other Ambulatory Visit: Payer: Self-pay | Admitting: Family Medicine

## 2024-02-26 ENCOUNTER — Telehealth: Payer: Self-pay | Admitting: *Deleted

## 2024-02-26 ENCOUNTER — Ambulatory Visit: Payer: Self-pay

## 2024-02-26 ENCOUNTER — Ambulatory Visit: Payer: Medicare Other | Admitting: Family Medicine

## 2024-02-26 DIAGNOSIS — R0602 Shortness of breath: Secondary | ICD-10-CM

## 2024-02-26 DIAGNOSIS — I6523 Occlusion and stenosis of bilateral carotid arteries: Secondary | ICD-10-CM

## 2024-02-26 DIAGNOSIS — J432 Centrilobular emphysema: Secondary | ICD-10-CM

## 2024-02-26 MED ORDER — CLOPIDOGREL BISULFATE 75 MG PO TABS: 75.0000 mg | ORAL_TABLET | Freq: Every day | ORAL | 0 refills | Status: DC

## 2024-02-26 MED ORDER — PROCHLORPERAZINE MALEATE 10 MG PO TABS: 10.0000 mg | ORAL_TABLET | Freq: Four times a day (QID) | ORAL | 1 refills | Status: AC | PRN

## 2024-02-26 NOTE — Telephone Encounter (Signed)
 The pt. Said that she not been around people, no fever just got up n/v, she was suppose to go to the PCP but because she is a patient here for lung cancer she should talk to them. I do not see any nausea med on her list

## 2024-02-26 NOTE — Telephone Encounter (Signed)
 Request for clopidogrel and breztri. Requires rf encounter to e-prescribed

## 2024-02-26 NOTE — Telephone Encounter (Signed)
 The patient says that she is ok for right now and we send in medicine for nausea and vomiting if she needs it. If she has not any more vomiting today or tom. She needs to call in and we will have to have you come in. She is ok with that

## 2024-02-26 NOTE — Telephone Encounter (Signed)
 Plavix and albuterol need refills.  Chief Complaint: vomiting Symptoms: N/V Frequency: started today at 0730 Pertinent Negatives: Patient denies fever, diarrhea, ABD pain Disposition: [x] ED /[] Urgent Care (no appt availability in office) / [] Appointment(In office/virtual)/ []  [facility]/ [] Home Care/ [] Refused Recommended Disposition /[] [facility]/ []  Follow-up with PCP Additional Notes: Pt states that she woke this morning drank coffee. Pt states that she felt her stomach "rumble" and started throwing up. Pt states that she threw up three times back-to-back. Pt states that d/t her N/V she would like to cancel her appt today, pt does not want to drive while throwing up and also does not have another right to appt. Pt is receiving tx for lung cancer. Per protocol pt advised to go to ED, pt also advised to call her oncologist. Pt agreeable.   Copied from CRM [id number]. Topic: Clinical - Red Word Triage >> Feb 26, 2024  8:27 AM Payton Doughty wrote: Red Word that prompted transfer to Nurse Triage: pt has been vomiting since 7 am. Reason for Disposition  [1] MODERATE vomiting (e.g., 3 - 5 times/day) AND [2] age > 60 years  Answer Assessment - Initial Assessment Questions 1. VOMITING SEVERITY: "How many times have you vomited in the past 24 hours?"     - MILD:  1 - 2 times/day    - MODERATE: 3 - 5 times/day, decreased oral intake without significant weight loss or symptoms of dehydration    - SEVERE: 6 or more times/day, vomits everything or nearly everything, with significant weight loss, symptoms of dehydration      Just started vomiting this morning, 0730 started feeling nauseous, thrown up about 3 times, right in a row 2. ONSET: "When did the vomiting begin?"      0730 today 4. ABDOMEN PAIN: "Are your having any abdomen pain?" If Yes : "How bad is it and what does it feel like?" (e.g., crampy, dull, intermittent, constant)      "Rolling and rumbling" 5. DIARRHEA: "Is there any  diarrhea?" If Yes, ask: "How many times today?"      Not at this time,  6. CONTACTS: "Is there anyone else in the family with the same symptoms?"      Lives alone 7. CAUSE: "What do you think is causing your vomiting?"     Unsure, it is food from yesterday 8. HYDRATION STATUS: "Any signs of dehydration?" (e.g., dry mouth [not only dry lips], too weak to stand) "When did you last urinate?"     Just started in the last hour 9. OTHER SYMPTOMS: "Do you have any other symptoms?" (e.g., fever, headache, vertigo, vomiting blood or coffee grounds, recent head injury)     denies  Answer Assessment - Initial Assessment Questions 1. SEVERITY: "How bad is the vomiting?" "How many times have you vomited in the past 24 hours?" "How bad is your nausea?"    - MILD:  1 - 2 times/day    - MODERATE: 3 - 5 times/day, decreased oral intake without significant weight loss or symptoms of dehydration    - SEVERE: 6 or more times/day, vomits everything or nearly everything, with significant weight loss, symptoms of dehydration     Moderate today  2. ONSET: "When did the nausea and/or vomiting begin?"      0730 3. MEDICINES FOR NAUSEA: "Do you have any anti-nausea medicine?" "What medicines have you taken and when did you last take the medicine?"     Denies having meds at home  for nausea 5. HYDRATION: "Any signs of dehydration?" (e.g., dry mouth [not just dry lips], too weak to stand, dizziness, new weight loss) "When did you last urinate?"     denies 6. ABDOMEN PAIN: "Are you having any abdomen pain?" If Yes, ask: "How bad is it?" "What  does it feel like?" (e.g., cramps, dull, intermittent, constant)      denies 7. DIARRHEA: "Is there any diarrhea?" If Yes, ask: "How many times today?"      denies 8. CANCER: "What type of cancer do you have?"      lung 9. CANCER - TREATMENT: "What cancer treatments have you received?" "When did you last receive them?" (e.g., chemotherapy, immunotherapy, radiation, or recent  surgery). If triager has access to the patient's medical record, triager should review treatments and administration dates.     immunotherapy 10. CANCER - NEUTROPENIA RISK: "Have you received chemotherapy recently? If Yes, ask: "When was it and what was your last WBC and ANC (absolute neutrophil count)?" "Were you told that your white cell count was low?" If triager has access to the patient's medical record, triager should review most recent labs. An ANC less than 1,000 - 1,500 means that the neutrophils are low and the immune system is weak.       Last treatment was 8 days ago 11. CAUSE: "What do you think is causing your nausea and/or vomiting?" (e.g., chemotherapy, new medications, the cancer itself, other family members with similar symptoms)       I really don't know, I think it might be a bug 12. PREVIOUS NAUSEA AND VOMITING: " If you have had nausea before, what have you done in the past to make it better?"       Not had nausea before with treatments,  13. OTHER SYMPTOMS: "Do you have any other symptoms?" (e.g., fever, headache, vertigo,  vomiting blood, coffee ground vomit, abdominal swelling, jaundice)       denies  Protocols used: Vomiting-A-AH, Cancer - Nausea and Vomiting-A-AH

## 2024-02-27 ENCOUNTER — Other Ambulatory Visit: Payer: Self-pay | Admitting: Family Medicine

## 2024-03-05 ENCOUNTER — Ambulatory Visit
Admission: RE | Admit: 2024-03-05 | Discharge: 2024-03-05 | Disposition: A | Discharge status: Home / Self Care | Payer: Federal, State, Local not specified - PPO | Source: Ambulatory Visit | Attending: Family Medicine | Admitting: Family Medicine

## 2024-03-05 DIAGNOSIS — Z1231 Encounter for screening mammogram for malignant neoplasm of breast: Secondary | ICD-10-CM | POA: Diagnosis not present

## 2024-03-11 ENCOUNTER — Inpatient Hospital Stay (HOSPITAL_BASED_OUTPATIENT_CLINIC_OR_DEPARTMENT_OTHER): Admitting: Internal Medicine

## 2024-03-11 ENCOUNTER — Inpatient Hospital Stay

## 2024-03-11 ENCOUNTER — Inpatient Hospital Stay: Attending: Internal Medicine

## 2024-03-11 ENCOUNTER — Encounter: Payer: Self-pay | Admitting: Internal Medicine

## 2024-03-11 VITALS — BP 121/83 | HR 70 | Temp 96.2°F

## 2024-03-11 VITALS — BP 127/80 | HR 90 | Temp 98.6°F | Resp 16 | Wt 187.0 lb

## 2024-03-11 DIAGNOSIS — Z79899 Other long term (current) drug therapy: Secondary | ICD-10-CM | POA: Insufficient documentation

## 2024-03-11 DIAGNOSIS — Z79620 Long term (current) use of immunosuppressive biologic: Secondary | ICD-10-CM | POA: Diagnosis not present

## 2024-03-11 DIAGNOSIS — E039 Hypothyroidism, unspecified: Secondary | ICD-10-CM

## 2024-03-11 DIAGNOSIS — Z5112 Encounter for antineoplastic immunotherapy: Secondary | ICD-10-CM | POA: Diagnosis not present

## 2024-03-11 DIAGNOSIS — F17210 Nicotine dependence, cigarettes, uncomplicated: Secondary | ICD-10-CM | POA: Diagnosis not present

## 2024-03-11 DIAGNOSIS — C7972 Secondary malignant neoplasm of left adrenal gland: Secondary | ICD-10-CM | POA: Insufficient documentation

## 2024-03-11 DIAGNOSIS — C3432 Malignant neoplasm of lower lobe, left bronchus or lung: Secondary | ICD-10-CM

## 2024-03-11 LAB — CBC WITH DIFFERENTIAL/PLATELET
Abs Immature Granulocytes: 0.03 10*3/uL (ref 0.00–0.07)
Basophils Absolute: 0.1 10*3/uL (ref 0.0–0.1)
Basophils Relative: 1 %
Eosinophils Absolute: 0.6 10*3/uL — ABNORMAL HIGH (ref 0.0–0.5)
Eosinophils Relative: 6 %
HCT: 43.6 % (ref 36.0–46.0)
Hemoglobin: 14.5 g/dL (ref 12.0–15.0)
Immature Granulocytes: 0 %
Lymphocytes Relative: 27 %
Lymphs Abs: 2.5 10*3/uL (ref 0.7–4.0)
MCH: 28.6 pg (ref 26.0–34.0)
MCHC: 33.3 g/dL (ref 30.0–36.0)
MCV: 86 fL (ref 80.0–100.0)
Monocytes Absolute: 0.5 10*3/uL (ref 0.1–1.0)
Monocytes Relative: 6 %
Neutro Abs: 5.4 10*3/uL (ref 1.7–7.7)
Neutrophils Relative %: 60 %
Platelets: 204 10*3/uL (ref 150–400)
RBC: 5.07 MIL/uL (ref 3.87–5.11)
RDW: 14.1 % (ref 11.5–15.5)
WBC: 9.1 10*3/uL (ref 4.0–10.5)
nRBC: 0 % (ref 0.0–0.2)

## 2024-03-11 LAB — COMPREHENSIVE METABOLIC PANEL WITH GFR
ALT: 15 U/L (ref 0–44)
AST: 19 U/L (ref 15–41)
Albumin: 3.7 g/dL (ref 3.5–5.0)
Alkaline Phosphatase: 82 U/L (ref 38–126)
Anion gap: 8 (ref 5–15)
BUN: 16 mg/dL (ref 8–23)
CO2: 23 mmol/L (ref 22–32)
Calcium: 9.2 mg/dL (ref 8.9–10.3)
Chloride: 110 mmol/L (ref 98–111)
Creatinine, Ser: 0.59 mg/dL (ref 0.44–1.00)
GFR, Estimated: >60 mL/min (ref >60)
Glucose, Bld: 96 mg/dL (ref 70–99)
Potassium: 4 mmol/L (ref 3.5–5.1)
Sodium: 141 mmol/L (ref 135–145)
Total Bilirubin: 0.6 mg/dL (ref 0.0–1.2)
Total Protein: 6.7 g/dL (ref 6.5–8.1)

## 2024-03-11 MED ORDER — HEPARIN SOD (PORK) LOCK FLUSH 100 UNIT/ML IV SOLN
500.0000 [IU] | Freq: Once | INTRAVENOUS | Status: AC | PRN
Start: 2024-03-11 — End: 2024-03-11
  Administered 2024-03-11: 500 [IU]
  Filled 2024-03-11: qty 5

## 2024-03-11 MED ORDER — SODIUM CHLORIDE 0.9 % IV SOLN
Freq: Once | INTRAVENOUS | Status: AC
  Filled 2024-03-11: qty 250

## 2024-03-11 MED ORDER — SODIUM CHLORIDE 0.9 % IV SOLN
200.0000 mg | Freq: Once | INTRAVENOUS | Status: AC
  Administered 2024-03-11: 200 mg via INTRAVENOUS
  Filled 2024-03-11: qty 8

## 2024-03-11 NOTE — Progress Notes (Signed)
 [facility] CONSULT NOTE  Patient Care Team: Malva Limes, MD as PCP - General (Family Medicine) Schnier, Latina Craver, MD (Vascular Surgery) Pasty Spillers, MD (Inactive) as Consulting Physician (Gastroenterology) Glory Buff, RN as Oncology Nurse Navigator Earna Coder, MD as Consulting Physician (Oncology) Raechel Chute, MD as Consulting Physician (Pulmonary Disease) Marcina Millard, MD as Consulting Physician (Cardiology) Estanislado Pandy, MD as Consulting Physician (Ophthalmology)   CHIEF COMPLAINTS/PURPOSE OF CONSULTATION: lung cancer  #  Oncology History Overview Note  #MAY 2022- LLL nodule 2.3 cm Adenocarcinoma Stage IA; s/p lobectomy.  No adjuvant therapy  # CT scan 4th June 2023-highly concerning for recurrent/metastatic disease with-. New left adrenal metastasis;  Ground-glass and part solid nodules in the peripheral right lower lobe, unchanged from 11/07/2021 but slightly enlarged from 01/01/2018. Findings are suspicious for indolent adenocarcinoma.  F One- PFL1 =80%; KRAS G12C PET scan JUNE 20th- 2023-  Enlarged 4 cm hypermetabolic LEFT adrenal metastasis;  Suspicion of metastatic adenopathy to the LEFT hilum.  Part solid nodule in the RIGHT lower lobe without metabolic activity.   3 ADRENAL BIOPSY- CK7 POSITIVE ADENOCARCINOMA- There is limited tissue remaining for ancillary testing, not likely  sufficient for NGS testing.   # AUG 7th, 2023-single agent Keytruda.    Right mildly complex cystic adnexal mass noted-June 13th, 2024- Significant interval enlargement of a multiseptated cystic lesion of the right ovary, now measuring 11.7 x 8.2 cm, previously 8.1 x 5.8 cm.- s/p in [facility] [October, 2024]- mucinous cystadenoma - stable.   Primary cancer of left lower lobe of lung (HCC)  05/09/2021 Initial Diagnosis   Primary cancer of left lower lobe of lung (HCC)   07/04/2022 -  Chemotherapy   Patient is on Treatment Plan : LUNG NSCLC  Pembrolizumab (200) q21d     07/10/2022 - 07/10/2022 Chemotherapy   Patient is on Treatment Plan : LUNG NSCLC Pembrolizumab (200) q21d      HISTORY OF PRESENTING ILLNESS: Patient  ambulating.  Alone.   Laura Nielsen 68 y.o.  female history of smoking; prior history of lung cancer-with likely recurrence to left adrenal gland [s/p Bx CK-7 positive TTF-1 negative]-clinically suggestive of recurrent lung cancer on single agent Rande Lawman is here for follow-up.  She has been having nausea with some vomiting one day last week on Tuesday. Sunday morning she had the same thing with the vomiting and nausea, she couldn't keep anything down, she finally took a compazine. Currently on resolved.    Denies any abdominal pain. Otherwise patient continues to have mild shortness of breath on exertion. Complains of cramping in the legs chronic.   Review of Systems  Constitutional:  Negative for chills, diaphoresis, fever, malaise/fatigue and weight loss.  HENT:  Negative for nosebleeds and sore throat.   Eyes:  Negative for double vision.  Respiratory:  Negative for cough, hemoptysis, sputum production, shortness of breath and wheezing.   Cardiovascular:  Negative for palpitations, orthopnea and leg swelling.  Gastrointestinal:  Negative for abdominal pain, blood in stool, constipation, diarrhea, heartburn, melena, nausea and vomiting.  Genitourinary:  Negative for dysuria, frequency and urgency.  Musculoskeletal:  Positive for back pain and joint pain.  Skin: Negative.  Negative for itching and rash.  Neurological:  Positive for tingling. Negative for dizziness, focal weakness, weakness and headaches.  Endo/Heme/Allergies:  Does not bruise/bleed easily.  Psychiatric/Behavioral:  Negative for depression. The patient is not nervous/anxious and does not have insomnia.      MEDICAL HISTORY:  Past  Medical History:  Diagnosis Date   AAA (abdominal aortic aneurysm) (HCC)    a.) s/p EVAR stent graft repair  03/27/2013   Adnexal mass    Aortic atherosclerosis (HCC)    Arthritis    Asthma    Atherosclerosis of native artery of both lower extremities with intermittent claudication (HCC)    B12 deficiency    Bilateral carotid artery stenosis    a.) doppler 05/06/2020 and 06/30/2021: 1-39% BICA; b.) doppler 11/20/2022: 60-79% RICA; c.) CTA neck 08/17/2023: >75% RICA, 30% LICA   CAD (coronary artery disease) 03/16/2009   a.) LHC/PCI 03/16/2009: 25% mLCx, 70% mRCA (3 x 18 mm Xience V DES); b.) MV 10/22/2017 and 03/16/2021: no ischemia; c.) MV 07/16/2023: mild apical ischemia   Cancer of left adrenal gland (HCC) 06/20/2022   a.) CNB 06/20/2022 -> pathology (+) for CK7 (TTF-1 -) adenocarcinoma   Centrilobular emphysema (HCC)    Cervical spinal stenosis    Cervical spondylosis with radiculopathy    Chronic back pain    Colon polyp    Complex ovarian cyst    Complication of anesthesia    a.) prone to experience postoperative hypotension   COPD (chronic obstructive pulmonary disease) (HCC)    DDD (degenerative disc disease), cervical    DDD (degenerative disc disease), thoracolumbar    Depression    Diabetes mellitus type 2, controlled (HCC)    Diastolic dysfunction 06/29/2023   a.) TTE 06/29/2023: EF >55%, no RWMAs, G1DD, triv PR, mild MR/TR   Fatty liver    GERD (gastroesophageal reflux disease)    Gout    History of bilateral cataract extraction    History of kidney stones    Hypercholesteremia    Long term (current) use of aspirin    Long term current use of clopidogrel    Lumbar spinal stenosis    Lumbar spondylosis    Non-small cell lung cancer metastatic to adrenal gland (HCC)    a.) s/p LLL wedge resection 04/06/2021 --> pathology (+) for well differentiated adenocarcinoma (stage 1A) --> no adjuvant Tx; b.) recurrent stage IV adenocarcinoma 05/2022 --> Tx'd with  pembrolizumab   Osteoarthritis    Osteoporosis    Primary hypertension    Seasonal allergies    Sleep apnea    a.) no  nocturnal PAP therapy since 2021   Vitamin D deficiency     SURGICAL HISTORY: Past Surgical History:  Procedure Laterality Date   ABDOMINAL AORTIC ENDOVASCULAR STENT GRAFT  03/27/2013   Dr. Levora Dredge   ABDOMINAL HYSTERECTOMY     Menometrorrhagia. Excessive bleeding. Unknown if cervix removed.    BILATERAL SALPINGOOPHORECTOMY  09/27/2023   CATARACT EXTRACTION W/PHACO Left 05/14/2023   Procedure: CATARACT EXTRACTION PHACO AND INTRAOCULAR LENS PLACEMENT (IOC) LEFT DIABETIC  OMIDRIA  19.40  01:34.8;  Surgeon: Estanislado Pandy, MD;  Location: [facility];  Service: Ophthalmology;  Laterality: Left;   CATARACT EXTRACTION W/PHACO Right 06/27/2023   Procedure: CATARACT EXTRACTION PHACO AND INTRAOCULAR LENS PLACEMENT (IOC) RIGHT DIABETIC  6.47  00:42.6;  Surgeon: Estanislado Pandy, MD;  Location: [facility];  Service: Ophthalmology;  Laterality: Right;   COLONOSCOPY WITH PROPOFOL N/A 01/05/2021   Procedure: COLONOSCOPY WITH PROPOFOL;  Surgeon: Pasty Spillers, MD;  Location: [facility];  Service: Endoscopy;  Laterality: N/A;   CORONARY ANGIOPLASTY WITH STENT PLACEMENT  03/16/2009   Procedure: CORONARY ANGIOPLASTY WITH STENT PLACEMENT; Location: [facility]; Surgeons: Harold Hedge, MD (diagnostic) and Lorine Bears, MD (interventional)   ENDARTERECTOMY Right 11/09/2023  Procedure: ENDARTERECTOMY CAROTID;  Surgeon: Renford Dills, MD;  Location: [facility];  Service: Vascular;  Laterality: Right;   ESOPHAGOGASTRODUODENOSCOPY (EGD) WITH PROPOFOL N/A 01/05/2021   Procedure: ESOPHAGOGASTRODUODENOSCOPY (EGD) WITH PROPOFOL;  Surgeon: Pasty Spillers, MD;  Location: [facility];  Service: Endoscopy;  Laterality: N/A;   EXTRACORPOREAL SHOCK WAVE LITHOTRIPSY Right 1999   INTERCOSTAL NERVE BLOCK Left 04/06/2021   Procedure: INTERCOSTAL NERVE BLOCK;  Surgeon: Loreli Slot, MD;  Location: [facility] OR;  Service: Thoracic;  Laterality: Left;   IR IMAGING GUIDED  PORT INSERTION  06/20/2022   LUNG LOBECTOMY Left 04/06/2021   robotic left lower lobectomy 04/06/2021 Dr. Dorris Fetch for Stage 1A adenocarcinoma   NODE DISSECTION Left 04/06/2021   Procedure: NODE DISSECTION;  Surgeon: Loreli Slot, MD;  Location: [facility];  Service: Thoracic;  Laterality: Left;   TUBAL LIGATION      SOCIAL HISTORY: Social History   Socioeconomic History   Marital status: Divorced    Spouse name: Not on file   Number of children: 2   Years of education: H/S   Highest education level: 12th grade  Occupational History   Occupation: Disabled   Occupation: retired  Tobacco Use   Smoking status: Every Day    Current packs/day: 1.00    Average packs/day: 1 pack/day for 51.3 years (51.3 ttl pk-yrs)    Types: Cigarettes    Start date: 1974   Smokeless tobacco: Never   Tobacco comments:    12/23/19 states she quit for 9 months and then started back.     02/20/24 1/2 ppd  Vaping Use   Vaping status: Never Used  Substance and Sexual Activity   Alcohol use: Not Currently   Drug use: No   Sexual activity: Not on file  Other Topics Concern   Not on file  Social History Narrative   Hx of smoking; quit prior to lung surgery then started back. Lives in [place] alone. Used to work in [employer], [employer]- Facilities manager. On disability sec to spinal pain.    Social Drivers of Health   Financial Resource Strain: Medium Risk (02/20/2024)   Overall Financial Resource Strain (CARDIA)    Difficulty of Paying Living Expenses: Somewhat hard  Food Insecurity: No Food Insecurity (02/20/2024)   Hunger Vital Sign    Worried About Running Out of Food in the Last Year: Never true    Ran Out of Food in the Last Year: Never true  Transportation Needs: No Transportation Needs (02/20/2024)   PRAPARE - Administrator, Civil Service (Medical): No    Lack of Transportation (Non-Medical): No  Physical Activity: Inactive (02/20/2024)   Exercise Vital Sign    Days of Exercise per Week: 0  days    Minutes of Exercise per Session: 0 min  Stress: Stress Concern Present (02/20/2024)   [employer] of Occupational Health - Occupational Stress Questionnaire    Feeling of Stress : To some extent  Social Connections: Socially Isolated (02/20/2024)   Social Connection and Isolation Panel [NHANES]    Frequency of Communication with Friends and Family: More than three times a week    Frequency of Social Gatherings with Friends and Family: Once a week    Attends Religious Services: Never    Database administrator or Organizations: No    Attends Banker Meetings: Never    Marital Status: Divorced  Catering manager Violence: Not At Risk (02/20/2024)   Humiliation, Afraid, Rape, and Kick questionnaire  Fear of Current or Ex-Partner: No    Emotionally Abused: No    Physically Abused: No    Sexually Abused: No    FAMILY HISTORY: Family History  Problem Relation Age of Onset   Hypertension Mother    Coronary artery disease Mother    Heart attack Mother        acute   Cancer Mother    Alcohol abuse Father    Depression Father    Hypertension Father    Heart attack Father 73       acute   Alcohol abuse Sister    Hyperlipidemia Sister    Hypertension Sister    Cancer Sister 66   Heart attack Sister        x's 2   Coronary artery disease Sister 26       x's 2   Breast cancer Sister 2    ALLERGIES:  is allergic to atorvastatin, omeprazole, and bupropion.  MEDICATIONS:  Current Outpatient Medications  Medication Sig Dispense Refill   acetaminophen (TYLENOL) 325 MG tablet Take 1-2 tablets (325-650 mg total) by mouth every 4 (four) hours as needed for mild pain (pain score 1-3) or moderate pain (pain score 4-6) (or temp >/= 101 F). (Patient not taking: Reported on 02/20/2024)     acetaminophen (TYLENOL) 500 MG tablet Take 500 mg by mouth every 6 (six) hours as needed for moderate pain (pain score 4-6).     albuterol (VENTOLIN HFA) 108 (90 Base) MCG/ACT  inhaler Inhale 2 puffs into the lungs every 6 (six) hours as needed for wheezing or shortness of breath. 8.5 g 2   allopurinol (ZYLOPRIM) 100 MG tablet Take 1 tablet (100 mg total) by mouth daily. 90 tablet 4   aspirin 81 MG tablet Take 81 mg by mouth daily.      bisoprolol (ZEBETA) 10 MG tablet Take 1 tablet (10 mg total) by mouth daily. 90 tablet 0   Budeson-Glycopyrrol-Formoterol (BREZTRI AEROSPHERE) 160-9-4.8 MCG/ACT AERO Inhale 2 puffs into the lungs in the morning and at bedtime. 1284 g 11   buPROPion (WELLBUTRIN XL) 150 MG 24 hr tablet Take 1 tablet (150 mg total) by mouth daily. (Patient not taking: Reported on 02/20/2024) 30 tablet 3   clopidogrel (PLAVIX) 75 MG tablet Take 1 tablet (75 mg total) by mouth daily. 90 tablet 0   ibuprofen (ADVIL) 200 MG tablet Take 200 mg by mouth 2 (two) times daily as needed (headaches).     lidocaine-prilocaine (EMLA) cream Apply on the port. 30 -45 min  prior to port access. 30 g 3   lisinopril-hydrochlorothiazide (ZESTORETIC) 20-12.5 MG tablet Take 1 tablet by mouth daily. (Patient taking differently: Take 0.5 tablets by mouth daily. 1 tab alternating with 1/2 tab) 90 tablet 0   magnesium gluconate (MAGONATE) 500 MG tablet Take 500 mg by mouth at bedtime.     oxyCODONE (OXY IR/ROXICODONE) 5 MG immediate release tablet Take 1-2 tablets (5-10 mg total) by mouth every 4 (four) hours as needed for moderate pain (pain score 4-6). (Patient not taking: Reported on 01/21/2024) 30 tablet 0   pantoprazole (PROTONIX) 20 MG tablet Take 1 tablet (20 mg total) by mouth daily. 90 tablet 4   Polyethyl Glyc-Propyl Glyc PF (SYSTANE PRESERVATIVE FREE) 0.4-0.3 % SOLN Place 1 drop into both eyes daily as needed (dry eyes).     prochlorperazine (COMPAZINE) 10 MG tablet Take 1 tablet (10 mg total) by mouth every 6 (six) hours as needed for nausea or vomiting. 45  tablet 1   rosuvastatin (CRESTOR) 20 MG tablet Take 1 tablet (20 mg total) by mouth daily. 90 tablet 4   Semaglutide, 1  MG/DOSE, (OZEMPIC, 1 MG/DOSE,) 4 MG/3ML SOPN Inject 1 mg into the skin once a week. 9 mL 1   No current facility-administered medications for this visit.   Facility-Administered Medications Ordered in Other Visits  Medication Dose Route Frequency Provider Last Rate Last Admin   heparin lock flush 100 UNIT/ML injection            heparin lock flush 100 unit/mL  500 Units Intracatheter Once PRN Earna Coder, MD       pembrolizumab [facility]) 200 mg in sodium chloride 0.9 % 50 mL chemo infusion  200 mg Intravenous Once Earna Coder, MD        Mild maculopapular rash on the [place]er area left more than right.  PHYSICAL EXAMINATION: ECOG PERFORMANCE STATUS: 1 - Symptomatic but completely ambulatory  Vitals:   03/11/24 0941  BP: 127/80  Pulse: 90  Resp: 16  Temp: 98.6 F (37 C)  SpO2: 98%        Filed Weights   03/11/24 0941  Weight: 187 lb (84.8 kg)         Physical Exam HENT:     Head: Normocephalic and atraumatic.     Mouth/Throat:     Pharynx: No oropharyngeal exudate.  Eyes:     Pupils: Pupils are equal, round, and reactive to light.  Cardiovascular:     Rate and Rhythm: Normal rate and regular rhythm.  Pulmonary:     Comments: Decreased breath sounds bilaterally.  No wheeze or crackles Abdominal:     General: Bowel sounds are normal. There is no distension.     Palpations: Abdomen is soft. There is no mass.     Tenderness: There is no abdominal tenderness. There is no guarding or rebound.  Musculoskeletal:        General: No tenderness. Normal range of motion.     Cervical back: Normal range of motion and neck supple.  Skin:    General: Skin is warm.  Neurological:     Mental Status: She is alert and oriented to person, place, and time.  Psychiatric:        Mood and Affect: Affect normal.      LABORATORY DATA:  I have reviewed the data as listed Lab Results  Component Value Date   WBC 9.1 03/11/2024   HGB 14.5 03/11/2024   HCT  43.6 03/11/2024   MCV 86.0 03/11/2024   PLT 204 03/11/2024   Recent Labs    01/21/24 0945 02/18/24 0808 03/11/24 0828  NA 137 140 141  K 3.7 3.4* 4.0  CL 103 107 110  CO2 25 27 23   GLUCOSE 94 97 96  BUN 21 17 16   CREATININE 0.70 0.64 0.59  CALCIUM 9.1 9.3 9.2  GFRNONAA >60 >60 >60  PROT 7.0 6.5 6.7  ALBUMIN 3.9 3.6 3.7  AST 20 18 19   ALT 17 14 15   ALKPHOS 102 96 82  BILITOT 0.7 0.7 0.6    RADIOGRAPHIC STUDIES: I have personally reviewed the radiological images as listed and agreed with the findings in the report. MM 3D SCREENING MAMMOGRAM BILATERAL BREAST Result Date: 03/06/2024 CLINICAL DATA:  Screening. EXAM: DIGITAL SCREENING BILATERAL MAMMOGRAM WITH TOMOSYNTHESIS AND CAD TECHNIQUE: Bilateral screening digital craniocaudal and mediolateral oblique mammograms were obtained. Bilateral screening digital breast tomosynthesis was performed. The images were evaluated with computer-aided detection.  COMPARISON:  Previous exam(s). ACR Breast Density Category a: The breasts are almost entirely fatty. FINDINGS: There are no findings suspicious for malignancy. IMPRESSION: No mammographic evidence of malignancy. A result letter of this screening mammogram will be mailed directly to the patient. RECOMMENDATION: Screening mammogram in one year. (Code:SM-B-01Y) BI-RADS CATEGORY  1: Negative. Electronically Signed   By: Frederico Hamman M.D.   On: 03/06/2024 13:24      ASSESSMENT & PLAN:   Primary cancer of left lower lobe of lung (HCC) #JUNE 2023-STAGE IV--recurrent/metastatic disease with New left adrenal metastasis;  PET scan JUNE 20th- 2023-  Enlarged 4 cm hypermetabolic LEFT adrenal metastasis;  Suspicion of metastatic adenopathy to the LEFT hilum.  Part solid nodule in the RIGHT lower lobe without metabolic activity. 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 Currently on single  agent Keytruda [PD-L1 greater than 80%].   MARCH 3rd, 2025- CT CAP-  No evidence of recurrent or metastatic disease.  Ground-glass atypical pulmonary cysts in the right lower lobe, stable. Recommend continued attention on follow-up as adenocarcinoma cannot be excluded.  # DEC 17th, 2024- last Keytruda. Given MARCH 3rd, 2-25- CT scans negative for any recurrent malignancy. Continue with [place] immunotherapy [until aug 2025 x2 years]  # Nausea/vomiting- ? GI illness- resolved s/p compazine. Likely unrelated to [place]- monitor for now.    # Mild Hypokalemia: discussed dietary supplement.stable  # Chronic pain: headaches/cervical pain/back pain [Dr.Naveira; pain doctor]  On NSAIDs [ monitor for now. OCT 2023- MRI brain- NEGATIVE for any metastatic disease.   Stable.  FEB 2024- MRI lumbar spine- degenerative disease- on ibuprofen prn- BID.  stable  # Left rib pain-Post throacotomy pain/tingling and numbness: stable [  DIS-Continue gabapentin -300 mg TID; and then at extra at night prn].  # CAD/ PVD- cramping- s/p evaluation [Dr.Schneir]; ? Carotid occlusion- s/p CEA surgery-  stable  # COPD-stable encouraged continue to avoid smoking; again counseled to quit smoking; s/p pulmonary stable  # IV Access :s/p  port placement stable  *AM appts- Monday- con-CT  # DISPOSITION:  #  keytruda today # follow up in 3 weeks- -MD labs/port- cbc/cmp;TSH; possible Keytruda;   # follow up in 6 weeks- -MD labs/port- cbc/cmp;possible Keytruda;Dr. Fabio Neighbors, MD 03/11/2024 10:01 AM

## 2024-03-11 NOTE — Assessment & Plan Note (Signed)
#  JUNE 2023-STAGE IV--recurrent/metastatic disease with New left adrenal metastasis;  PET scan JUNE 20th- 2023-  Enlarged 4 cm hypermetabolic LEFT adrenal metastasis;  Suspicion of metastatic adenopathy to the LEFT hilum.  Part solid nodule in the RIGHT lower lobe without metabolic activity. 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 Currently on single agent Keytruda [PD-L1 greater than 80%].   MARCH 3rd, 2025- CT CAP-  No evidence of recurrent or metastatic disease.  Ground-glass atypical pulmonary cysts in the right lower lobe, stable. Recommend continued attention on follow-up as adenocarcinoma cannot be excluded.  # DEC 17th, 2024- last Keytruda. Given MARCH 3rd, 2-25- CT scans negative for any recurrent malignancy. Continue with [place] immunotherapy [until aug 2025 x2 years]  # Nausea/vomiting- ? GI illness- resolved s/p compazine. Likely unrelated to [place]- monitor for now.    # Mild Hypokalemia: discussed dietary supplement.stable  # Chronic pain: headaches/cervical pain/back pain [Dr.Naveira; pain doctor]  On NSAIDs [ monitor for now. OCT 2023- MRI brain- NEGATIVE for any metastatic disease.   Stable.  FEB 2024- MRI lumbar spine- degenerative disease- on ibuprofen prn- BID.  stable  # Left rib pain-Post throacotomy pain/tingling and numbness: stable [  DIS-Continue gabapentin -300 mg TID; and then at extra at night prn].  # CAD/ PVD- cramping- s/p evaluation [Dr.Schneir]; ? Carotid occlusion- s/p CEA surgery-  stable  # COPD-stable encouraged continue to avoid smoking; again counseled to quit smoking; s/p pulmonary stable  # IV Access :s/p  port placement stable  *AM appts- Monday- con-CT  # DISPOSITION:  #  keytruda today # follow up in 3 weeks- -MD labs/port- cbc/cmp;TSH; possible Keytruda;   # follow up in 6 weeks- -MD labs/port- cbc/cmp;possible Keytruda;Dr.  B

## 2024-03-11 NOTE — Progress Notes (Signed)
 She had a Mammo on 03/05/2024. She has been having nausea with some vomiting one day last week on Tuesday. Sunday morning she had the same thing with the vomiting and nausea, she couldn't keep anything down, she finally took a compazine pill.

## 2024-03-12 ENCOUNTER — Telehealth: Payer: Self-pay | Admitting: *Deleted

## 2024-03-12 NOTE — Telephone Encounter (Signed)
 Patient called today to let Laura Nielsen know that she woke up ok but then 11 am she got sick , cramping, bloated and then she started diarrhea. She did take a pill for nausea the MD told to take.  She wants md to know

## 2024-03-12 NOTE — Telephone Encounter (Signed)
 Patient went  out to get out of the home. 11 am she started feeling like she is get nauseated and then bloated  and then at home she started having diarrhea. She took compazine and 45 min. It came make up with vomiting, she does not like water so she drinks diet Mt. Dew and sprite. I told her to take compazine every 6 hours to see if that can help. She wanted Dr. Kizzie Bane to be in the loop

## 2024-03-17 ENCOUNTER — Ambulatory Visit (INDEPENDENT_AMBULATORY_CARE_PROVIDER_SITE_OTHER): Admitting: Family Medicine

## 2024-03-17 VITALS — BP 110/77 | HR 77 | Temp 97.7°F | Ht 68.0 in | Wt 185.0 lb

## 2024-03-17 DIAGNOSIS — K760 Fatty (change of) liver, not elsewhere classified: Secondary | ICD-10-CM | POA: Diagnosis not present

## 2024-03-17 DIAGNOSIS — K649 Unspecified hemorrhoids: Secondary | ICD-10-CM

## 2024-03-17 DIAGNOSIS — K5900 Constipation, unspecified: Secondary | ICD-10-CM

## 2024-03-17 DIAGNOSIS — E7800 Pure hypercholesterolemia, unspecified: Secondary | ICD-10-CM

## 2024-03-17 DIAGNOSIS — E538 Deficiency of other specified B group vitamins: Secondary | ICD-10-CM

## 2024-03-17 DIAGNOSIS — E559 Vitamin D deficiency, unspecified: Secondary | ICD-10-CM

## 2024-03-17 DIAGNOSIS — E119 Type 2 diabetes mellitus without complications: Secondary | ICD-10-CM | POA: Diagnosis not present

## 2024-03-17 DIAGNOSIS — E1169 Type 2 diabetes mellitus with other specified complication: Secondary | ICD-10-CM | POA: Diagnosis not present

## 2024-03-17 DIAGNOSIS — R112 Nausea with vomiting, unspecified: Secondary | ICD-10-CM

## 2024-03-17 MED ORDER — LACTULOSE 10 GM/15ML PO SOLN
20.0000 g | Freq: Two times a day (BID) | ORAL | 0 refills | Status: AC | PRN
Start: 2024-03-17 — End: ?

## 2024-03-17 NOTE — Patient Instructions (Signed)
 Please review the attached list of medications and notify my office if there are any errors.   Use of the counter Witch-Hazel pads to shrink up hemorrhoids

## 2024-03-17 NOTE — Progress Notes (Signed)
 Established patient visit   Patient: Laura Nielsen [facility]   DOB: 07-25-1956   68 y.o. Female  MRN: [id number] Visit Date: 03/17/2024  Today's healthcare provider: Jeralene Mom, MD   Chief Complaint  Patient presents with   Diabetes    Patient does not check her glucose at home   Hypertension   Hemorrhoids    Patient reports episodes of vomiting and diarrhea x 4 since 02/26/24.  Patient states she has had bleeding from the hemorrhoids for 5 days.  She is using Preparation H and using sitz baths.   Subjective    Discussed the use of AI scribe software for clinical note transcription with the patient, who gave verbal consent to proceed.  History of Present Illness   Laura Nielsen is a 68 year old female with diabetes who presents for a checkup and follow-up on her medications and blood sugar levels.  She has experienced episodes of nausea and vomiting four times in the last four weeks. The first episode occurred at the end of March after drinking coffee and talking on the phone, leading to persistent nausea and vomiting throughout the day, which prevented her from attending a scheduled appointment. She received a prescription for nausea and vomiting but did not take it until the following day, by which time she felt better but was drained and unable to eat. Subsequent episodes have followed a similar pattern, with the most recent occurring last Wednesday, accompanied by diarrhea.  She has a history of constipation and uses enemas every three to four days to manage it, as other treatments like magnesium citrate, Miralax, and Metamucil have been ineffective. She also takes Dulcolax to soften stools. Despite dietary efforts, including eating fiber-rich foods, she continues to experience constipation. She is unable to use enemas currently due to a bleeding hemorrhoid.  She developed a bleeding hemorrhoid last Thursday, following an episode of vomiting and diarrhea. She has been using  Epsom salt baths and Preparation H for relief. The hemorrhoid is painful and causes bleeding, especially after bowel movements.  She is currently taking Ozempic  1 mg for diabetes management, which she has been taking for the last 3 years, and reports no known side effects from the medication. She does not monitor her blood sugar at home as she has not been instructed to do so. Her A1c was last checked in December, and she recalls that her other lab results were good, although she cannot remember specific details.     Lab Results  Component Value Date   HGBA1C 5.4 11/09/2023   HGBA1C 5.9 (A) 02/13/2023   HGBA1C 5.7 (A) 08/14/2022   Lab Results  Component Value Date   NA 141 03/11/2024   K 4.0 03/11/2024   CREATININE 0.59 03/11/2024   GFRNONAA >60 03/11/2024   GLUCOSE 96 03/11/2024   Lab Results  Component Value Date   CHOL 150 02/13/2023   HDL 42 02/13/2023   LDLCALC 82 02/13/2023   TRIG 150 (H) 02/13/2023   CHOLHDL 3.6 02/13/2023   Last vitamin D  Lab Results  Component Value Date   25OHVITD2 8.2 03/08/2017   25OHVITD3 16 03/08/2017   VD25OH 31.7 02/13/2023   Lab Results  Component Value Date   VITAMINB12 308 02/13/2023     Medications: Outpatient Medications Prior to Visit  Medication Sig   acetaminophen  (TYLENOL ) 500 MG tablet Take 500 mg by mouth every 6 (six) hours as needed for moderate pain (pain score 4-6).   albuterol  (VENTOLIN   HFA) 108 (90 Base) MCG/ACT inhaler Inhale 2 puffs into the lungs every 6 (six) hours as needed for wheezing or shortness of breath.   allopurinol  (ZYLOPRIM ) 100 MG tablet Take 1 tablet (100 mg total) by mouth daily.   aspirin  81 MG tablet Take 81 mg by mouth daily.    bisoprolol  (ZEBETA ) 10 MG tablet Take 1 tablet (10 mg total) by mouth daily.   Budeson-Glycopyrrol-Formoterol  (BREZTRI  AEROSPHERE) 160-9-4.8 MCG/ACT AERO Inhale 2 puffs into the lungs in the morning and at bedtime.   clopidogrel  (PLAVIX ) 75 MG tablet Take 1 tablet (75 mg  total) by mouth daily.   ibuprofen (ADVIL) 200 MG tablet Take 200 mg by mouth 2 (two) times daily as needed (headaches).   lidocaine -prilocaine  (EMLA ) cream Apply on the port. 30 -45 min  prior to port access.   lisinopril -hydrochlorothiazide  (ZESTORETIC ) 20-12.5 MG tablet Take 1 tablet by mouth daily. (Patient taking differently: Take 0.5 tablets by mouth daily. 1 tab alternating with 1/2 tab)   magnesium gluconate (MAGONATE) 500 MG tablet Take 500 mg by mouth at bedtime.   oxyCODONE  (OXY IR/ROXICODONE ) 5 MG immediate release tablet Take 1-2 tablets (5-10 mg total) by mouth every 4 (four) hours as needed for moderate pain (pain score 4-6). (Patient not taking: Reported on 03/17/2024)   pantoprazole  (PROTONIX ) 20 MG tablet Take 1 tablet (20 mg total) by mouth daily.   Polyethyl Glyc-Propyl Glyc PF (SYSTANE PRESERVATIVE FREE) 0.4-0.3 % SOLN Place 1 drop into both eyes daily as needed (dry eyes).   prochlorperazine  (COMPAZINE ) 10 MG tablet Take 1 tablet (10 mg total) by mouth every 6 (six) hours as needed for nausea or vomiting.   rosuvastatin  (CRESTOR ) 20 MG tablet Take 1 tablet (20 mg total) by mouth daily.   Semaglutide , 1 MG/DOSE, (OZEMPIC , 1 MG/DOSE,) 4 MG/3ML SOPN Inject 1 mg into the skin once a week.   [DISCONTINUED] acetaminophen  (TYLENOL ) 325 MG tablet Take 1-2 tablets (325-650 mg total) by mouth every 4 (four) hours as needed for mild pain (pain score 1-3) or moderate pain (pain score 4-6) (or temp >/= 101 F). (Patient not taking: Reported on 02/20/2024)   [DISCONTINUED] buPROPion  (WELLBUTRIN  XL) 150 MG 24 hr tablet Take 1 tablet (150 mg total) by mouth daily. (Patient not taking: Reported on 02/20/2024)   Facility-Administered Medications Prior to Visit  Medication Dose Route Frequency Provider   heparin  lock flush 100 UNIT/ML injection        Review of Systems  Constitutional:  Negative for appetite change, chills, fatigue and fever.  Respiratory:  Negative for chest tightness and  shortness of breath.   Cardiovascular:  Negative for chest pain and palpitations.  Gastrointestinal:  Negative for abdominal pain, nausea and vomiting.  Neurological:  Negative for dizziness and weakness.       Objective    BP 110/77 (BP Location: Left Arm, Patient Position: Sitting, Cuff Size: Normal)   Pulse 77   Temp 97.7 F (36.5 C) (Oral)   Ht 5\' 8"  (1.727 m)   Wt 185 lb (83.9 kg)   SpO2 99%   BMI 28.13 kg/m   Physical Exam    General: Appearance:    Well developed, well nourished female in no acute distress  Eyes:    PERRL, conjunctiva/corneas clear, EOM's intact       Lungs:     Clear to auscultation bilaterally, respirations unlabored  Heart:    Normal heart rate. Normal rhythm.  3/6 harsh, crescendo-decrescendo, systolic murmur at right upper sternal border  MS:   All extremities are intact.    Neurologic:   Awake, alert, oriented x 3. No apparent focal neurological defect.         Assessment & Plan     1. Constipation, unspecified constipation type (Primary) - lactulose  (CHRONULAC ) 10 GM/15ML solution; Take 30 mLs (20 g total) by mouth 2 (two) times daily as needed for mild constipation.  Dispense: 236 mL; Refill: 0   2. Type 2 diabetes mellitus without complication, without long-term current use of insulin  (HCC)  - Hemoglobin A1c - Urine Albumin/Creatinine with ratio (send out) [LAB689]  3. Hemorrhoids, unspecified hemorrhoid type Try OTC Witch Hazel  4. Nausea and vomiting, unspecified vomiting type Intermittent over the last month, recently prescribed prochlorperazine  by oncoloy.   5. B12 deficiency  - Vitamin B12  6. Fatty liver  7. Hypercholesteremia She is tolerating rosuvastatin  well with no adverse effects.   - CBC - Lipid panel  8. Vitamin D  insufficiency  - VITAMIN D  25 Hydroxy (Vit-D Deficiency, Fractures)      Jeralene Mom, MD  [facility] [phone] (phone) [phone] (fax)  [facility]

## 2024-03-18 LAB — CBC
Hematocrit: 47.7 % — ABNORMAL HIGH (ref 34.0–46.6)
Hemoglobin: 16 g/dL — ABNORMAL HIGH (ref 11.1–15.9)
MCH: 29.1 pg (ref 26.6–33.0)
MCHC: 33.5 g/dL (ref 31.5–35.7)
MCV: 87 fL (ref 79–97)
Platelets: 239 10*3/uL (ref 150–450)
RBC: 5.5 x10E6/uL — ABNORMAL HIGH (ref 3.77–5.28)
RDW: 14 % (ref 11.7–15.4)
WBC: 10.2 10*3/uL (ref 3.4–10.8)

## 2024-03-18 LAB — VITAMIN B12: Vitamin B-12: 352 pg/mL (ref 232–1245)

## 2024-03-18 LAB — LIPID PANEL
Chol/HDL Ratio: 3.5 ratio (ref 0.0–4.4)
Cholesterol, Total: 140 mg/dL (ref 100–199)
HDL: 40 mg/dL (ref >39)
LDL Chol Calc (NIH): 77 mg/dL (ref 0–99)
Triglycerides: 130 mg/dL (ref 0–149)
VLDL Cholesterol Cal: 23 mg/dL (ref 5–40)

## 2024-03-18 LAB — HEMOGLOBIN A1C
Est. average glucose Bld gHb Est-mCnc: 117 mg/dL
Hgb A1c MFr Bld: 5.7 % — ABNORMAL HIGH (ref 4.8–5.6)

## 2024-03-18 LAB — MICROALBUMIN / CREATININE URINE RATIO
Creatinine, Urine: 220.3 mg/dL
Microalb/Creat Ratio: 33 mg/g{creat} — ABNORMAL HIGH (ref 0–29)
Microalbumin, Urine: 71.6 ug/mL

## 2024-03-18 LAB — VITAMIN D 25 HYDROXY (VIT D DEFICIENCY, FRACTURES): Vit D, 25-Hydroxy: 26.2 ng/mL — ABNORMAL LOW (ref 30.0–100.0)

## 2024-03-21 ENCOUNTER — Encounter: Payer: Self-pay | Admitting: Family Medicine

## 2024-03-31 ENCOUNTER — Inpatient Hospital Stay (HOSPITAL_BASED_OUTPATIENT_CLINIC_OR_DEPARTMENT_OTHER): Admitting: Internal Medicine

## 2024-03-31 ENCOUNTER — Encounter: Payer: Self-pay | Admitting: Internal Medicine

## 2024-03-31 ENCOUNTER — Inpatient Hospital Stay

## 2024-03-31 VITALS — BP 121/78 | HR 76 | Temp 97.1°F | Resp 16 | Wt 186.0 lb

## 2024-03-31 VITALS — BP 104/70 | HR 74 | Resp 16

## 2024-03-31 DIAGNOSIS — C3432 Malignant neoplasm of lower lobe, left bronchus or lung: Secondary | ICD-10-CM

## 2024-03-31 DIAGNOSIS — E039 Hypothyroidism, unspecified: Secondary | ICD-10-CM | POA: Diagnosis not present

## 2024-03-31 DIAGNOSIS — C7972 Secondary malignant neoplasm of left adrenal gland: Secondary | ICD-10-CM | POA: Diagnosis not present

## 2024-03-31 DIAGNOSIS — Z79899 Other long term (current) drug therapy: Secondary | ICD-10-CM | POA: Diagnosis not present

## 2024-03-31 DIAGNOSIS — Z5112 Encounter for antineoplastic immunotherapy: Secondary | ICD-10-CM | POA: Diagnosis not present

## 2024-03-31 DIAGNOSIS — F17210 Nicotine dependence, cigarettes, uncomplicated: Secondary | ICD-10-CM | POA: Diagnosis not present

## 2024-03-31 DIAGNOSIS — Z79620 Long term (current) use of immunosuppressive biologic: Secondary | ICD-10-CM | POA: Diagnosis not present

## 2024-03-31 LAB — CBC WITH DIFFERENTIAL/PLATELET
Abs Immature Granulocytes: 0.04 10*3/uL (ref 0.00–0.07)
Basophils Absolute: 0.1 10*3/uL (ref 0.0–0.1)
Basophils Relative: 1 %
Eosinophils Absolute: 0.5 10*3/uL (ref 0.0–0.5)
Eosinophils Relative: 5 %
HCT: 44.8 % (ref 36.0–46.0)
Hemoglobin: 14.7 g/dL (ref 12.0–15.0)
Immature Granulocytes: 0 %
Lymphocytes Relative: 27 %
Lymphs Abs: 2.7 10*3/uL (ref 0.7–4.0)
MCH: 27.9 pg (ref 26.0–34.0)
MCHC: 32.8 g/dL (ref 30.0–36.0)
MCV: 85 fL (ref 80.0–100.0)
Monocytes Absolute: 0.6 10*3/uL (ref 0.1–1.0)
Monocytes Relative: 6 %
Neutro Abs: 6 10*3/uL (ref 1.7–7.7)
Neutrophils Relative %: 61 %
Platelets: 220 10*3/uL (ref 150–400)
RBC: 5.27 MIL/uL — ABNORMAL HIGH (ref 3.87–5.11)
RDW: 14.3 % (ref 11.5–15.5)
WBC: 9.9 10*3/uL (ref 4.0–10.5)
nRBC: 0 % (ref 0.0–0.2)

## 2024-03-31 LAB — COMPREHENSIVE METABOLIC PANEL WITH GFR
ALT: 14 U/L (ref 0–44)
AST: 19 U/L (ref 15–41)
Albumin: 3.6 g/dL (ref 3.5–5.0)
Alkaline Phosphatase: 80 U/L (ref 38–126)
Anion gap: 8 (ref 5–15)
BUN: 17 mg/dL (ref 8–23)
CO2: 23 mmol/L (ref 22–32)
Calcium: 9 mg/dL (ref 8.9–10.3)
Chloride: 109 mmol/L (ref 98–111)
Creatinine, Ser: 0.6 mg/dL (ref 0.44–1.00)
GFR, Estimated: >60 mL/min (ref >60)
Glucose, Bld: 95 mg/dL (ref 70–99)
Potassium: 3.8 mmol/L (ref 3.5–5.1)
Sodium: 140 mmol/L (ref 135–145)
Total Bilirubin: 0.5 mg/dL (ref 0.0–1.2)
Total Protein: 6.4 g/dL — ABNORMAL LOW (ref 6.5–8.1)

## 2024-03-31 LAB — TSH: TSH: 1.734 u[IU]/mL (ref 0.350–4.500)

## 2024-03-31 MED ORDER — SODIUM CHLORIDE 0.9 % IV SOLN
Freq: Once | INTRAVENOUS | Status: AC
Start: 2024-03-31 — End: 2024-03-31
  Filled 2024-03-31: qty 250

## 2024-03-31 MED ORDER — SODIUM CHLORIDE 0.9 % IV SOLN
200.0000 mg | Freq: Once | INTRAVENOUS | Status: AC
  Administered 2024-03-31: 200 mg via INTRAVENOUS
  Filled 2024-03-31: qty 8

## 2024-03-31 MED ORDER — HEPARIN SOD (PORK) LOCK FLUSH 100 UNIT/ML IV SOLN
500.0000 [IU] | Freq: Once | INTRAVENOUS | Status: AC | PRN
Start: 2024-03-31 — End: 2024-03-31
  Administered 2024-03-31: 500 [IU]
  Filled 2024-03-31: qty 5

## 2024-03-31 MED ORDER — SODIUM CHLORIDE 0.9% FLUSH
10.0000 mL | INTRAVENOUS | Status: DC | PRN
  Administered 2024-03-31: 10 mL via INTRAVENOUS
  Filled 2024-03-31: qty 10

## 2024-03-31 NOTE — Progress Notes (Signed)
 [facility] CONSULT NOTE  Patient Care Team: Lamon Pillow, MD as PCP - General (Family Medicine) Schnier, Ninette Basque, MD (Vascular Surgery) Irby Mannan, MD (Inactive) as Consulting Physician (Gastroenterology) Drake Gens, RN as Oncology Nurse Navigator Gwyn Leos, MD as Consulting Physician (Oncology) Vergia Glasgow, MD as Consulting Physician (Pulmonary Disease) Percival Brace, MD as Consulting Physician (Cardiology) Trudi Fus, MD as Consulting Physician (Ophthalmology)   CHIEF COMPLAINTS/PURPOSE OF CONSULTATION: lung cancer  #  Oncology History Overview Note  #MAY 2022- LLL nodule 2.3 cm Adenocarcinoma Stage IA; s/p lobectomy.  No adjuvant therapy  # CT scan 4th June 2023-highly concerning for recurrent/metastatic disease with-. New left adrenal metastasis;  Ground-glass and part solid nodules in the peripheral right lower lobe, unchanged from 11/07/2021 but slightly enlarged from 01/01/2018. Findings are suspicious for indolent adenocarcinoma.  F One- PFL1 =80%; KRAS G12C PET scan JUNE 20th- 2023-  Enlarged 4 cm hypermetabolic LEFT adrenal metastasis;  Suspicion of metastatic adenopathy to the LEFT hilum.  Part solid nodule in the RIGHT lower lobe without metabolic activity.   3 ADRENAL BIOPSY- CK7 POSITIVE ADENOCARCINOMA- There is limited tissue remaining for ancillary testing, not likely  sufficient for NGS testing.   # AUG 7th, 2023-single agent Keytruda .    Right mildly complex cystic adnexal mass noted-June 13th, 2024- Significant interval enlargement of a multiseptated cystic lesion of the right ovary, now measuring 11.7 x 8.2 cm, previously 8.1 x 5.8 cm.- s/p in [facility] [October, 2024]- mucinous cystadenoma - stable.   Primary cancer of left lower lobe of lung (HCC)  05/09/2021 Initial Diagnosis   Primary cancer of left lower lobe of lung (HCC)   07/04/2022 -  Chemotherapy   Patient is on Treatment Plan : LUNG NSCLC  Pembrolizumab  (200) q21d     07/10/2022 - 07/10/2022 Chemotherapy   Patient is on Treatment Plan : LUNG NSCLC Pembrolizumab  (200) q21d      HISTORY OF PRESENTING ILLNESS: Patient  ambulating.  Alone.   Laura Nielsen 68 y.o.  female history of smoking; prior history of lung cancer-with likely recurrence to left adrenal gland [s/p Bx CK-7 positive TTF-1 negative]-clinically suggestive of recurrent lung cancer on single agent Keytruda  is here for follow-up.  Patient had an episode  of  nauseated and then bloated  and then at home she started having diarrhea. She took compazine  and then resolved.    Denies any abdominal pain. Otherwise patient continues to have mild shortness of breath on exertion. Complains of cramping in the legs chronic.   Review of Systems  Constitutional:  Negative for chills, diaphoresis, fever, malaise/fatigue and weight loss.  HENT:  Negative for nosebleeds and sore throat.   Eyes:  Negative for double vision.  Respiratory:  Negative for cough, hemoptysis, sputum production, shortness of breath and wheezing.   Cardiovascular:  Negative for palpitations, orthopnea and leg swelling.  Gastrointestinal:  Negative for abdominal pain, blood in stool, constipation, diarrhea, heartburn, melena, nausea and vomiting.  Genitourinary:  Negative for dysuria, frequency and urgency.  Musculoskeletal:  Positive for back pain and joint pain.  Skin: Negative.  Negative for itching and rash.  Neurological:  Positive for tingling. Negative for dizziness, focal weakness, weakness and headaches.  Endo/Heme/Allergies:  Does not bruise/bleed easily.  Psychiatric/Behavioral:  Negative for depression. The patient is not nervous/anxious and does not have insomnia.      MEDICAL HISTORY:  Past Medical History:  Diagnosis Date   AAA (abdominal aortic aneurysm) (HCC)  a.) s/p EVAR stent graft repair 03/27/2013   Adnexal mass    Aortic atherosclerosis (HCC)    Arthritis    Asthma     Atherosclerosis of native artery of both lower extremities with intermittent claudication (HCC)    B12 deficiency    Bilateral carotid artery stenosis    a.) doppler 05/06/2020 and 06/30/2021: 1-39% BICA; b.) doppler 11/20/2022: 60-79% RICA; c.) CTA neck 08/17/2023: >75% RICA, 30% LICA   CAD (coronary artery disease) 03/16/2009   a.) LHC/PCI 03/16/2009: 25% mLCx, 70% mRCA (3 x 18 mm Xience V DES); b.) MV 10/22/2017 and 03/16/2021: no ischemia; c.) MV 07/16/2023: mild apical ischemia   Cancer of left adrenal gland (HCC) 06/20/2022   a.) CNB 06/20/2022 -> pathology (+) for CK7 (TTF-1 -) adenocarcinoma   Centrilobular emphysema (HCC)    Cervical spinal stenosis    Cervical spondylosis with radiculopathy    Chronic back pain    Colon polyp    Complex ovarian cyst    Complication of anesthesia    a.) prone to experience postoperative hypotension   COPD (chronic obstructive pulmonary disease) (HCC)    DDD (degenerative disc disease), cervical    DDD (degenerative disc disease), thoracolumbar    Depression    Diabetes mellitus type 2, controlled (HCC)    Diastolic dysfunction 06/29/2023   a.) TTE 06/29/2023: EF >55%, no RWMAs, G1DD, triv PR, mild MR/TR   Fatty liver    GERD (gastroesophageal reflux disease)    Gout    History of bilateral cataract extraction    History of kidney stones    Hypercholesteremia    Long term (current) use of aspirin     Long term current use of clopidogrel     Lumbar spinal stenosis    Lumbar spondylosis    Non-small cell lung cancer metastatic to adrenal gland (HCC)    a.) s/p LLL wedge resection 04/06/2021 --> pathology (+) for well differentiated adenocarcinoma (stage 1A) --> no adjuvant Tx; b.) recurrent stage IV adenocarcinoma 05/2022 --> Tx'd with  pembrolizumab    Osteoarthritis    Osteoporosis    Primary hypertension    Seasonal allergies    Sleep apnea    a.) no nocturnal PAP therapy since 2021   Vitamin D  deficiency     SURGICAL HISTORY: Past  Surgical History:  Procedure Laterality Date   ABDOMINAL AORTIC ENDOVASCULAR STENT GRAFT  03/27/2013   Dr. Devon Fogo   ABDOMINAL HYSTERECTOMY     Menometrorrhagia. Excessive bleeding. Unknown if cervix removed.    BILATERAL SALPINGOOPHORECTOMY  09/27/2023   CATARACT EXTRACTION W/PHACO Left 05/14/2023   Procedure: CATARACT EXTRACTION PHACO AND INTRAOCULAR LENS PLACEMENT (IOC) LEFT DIABETIC  OMIDRIA   19.40  01:34.8;  Surgeon: Trudi Fus, MD;  Location: [facility];  Service: Ophthalmology;  Laterality: Left;   CATARACT EXTRACTION W/PHACO Right 06/27/2023   Procedure: CATARACT EXTRACTION PHACO AND INTRAOCULAR LENS PLACEMENT (IOC) RIGHT DIABETIC  6.47  00:42.6;  Surgeon: Trudi Fus, MD;  Location: [facility];  Service: Ophthalmology;  Laterality: Right;   COLONOSCOPY WITH PROPOFOL  N/A 01/05/2021   Procedure: COLONOSCOPY WITH PROPOFOL ;  Surgeon: Irby Mannan, MD;  Location: [facility] ENDOSCOPY;  Service: Endoscopy;  Laterality: N/A;   CORONARY ANGIOPLASTY WITH STENT PLACEMENT  03/16/2009   Procedure: CORONARY ANGIOPLASTY WITH STENT PLACEMENT; Location: [facility]; Surgeons: Starlette Ebbs, MD (diagnostic) and Antionette Kirks, MD (interventional)   ENDARTERECTOMY Right 11/09/2023   Procedure: ENDARTERECTOMY CAROTID;  Surgeon: Jackquelyn Mass, MD;  Location: [facility] ORS;  Service:  Vascular;  Laterality: Right;   ESOPHAGOGASTRODUODENOSCOPY (EGD) WITH PROPOFOL  N/A 01/05/2021   Procedure: ESOPHAGOGASTRODUODENOSCOPY (EGD) WITH PROPOFOL ;  Surgeon: Irby Mannan, MD;  Location: [facility] ENDOSCOPY;  Service: Endoscopy;  Laterality: N/A;   EXTRACORPOREAL SHOCK WAVE LITHOTRIPSY Right 1999   INTERCOSTAL NERVE BLOCK Left 04/06/2021   Procedure: INTERCOSTAL NERVE BLOCK;  Surgeon: Zelphia Higashi, MD;  Location: [facility] OR;  Service: Thoracic;  Laterality: Left;   IR IMAGING GUIDED PORT INSERTION  06/20/2022   LUNG LOBECTOMY Left 04/06/2021   robotic left lower  lobectomy 04/06/2021 Dr. Luna Salinas for Stage 1A adenocarcinoma   NODE DISSECTION Left 04/06/2021   Procedure: NODE DISSECTION;  Surgeon: Zelphia Higashi, MD;  Location: [facility] OR;  Service: Thoracic;  Laterality: Left;   TUBAL LIGATION      SOCIAL HISTORY: Social History   Socioeconomic History   Marital status: Divorced    Spouse name: Not on file   Number of children: 2   Years of education: H/S   Highest education level: 12th grade  Occupational History   Occupation: Disabled   Occupation: retired  Tobacco Use   Smoking status: Every Day    Current packs/day: 1.00    Average packs/day: 1 pack/day for 51.3 years (51.3 ttl pk-yrs)    Types: Cigarettes    Start date: 1974   Smokeless tobacco: Never   Tobacco comments:    12/23/19 states she quit for 9 months and then started back.     02/20/24 1/2 ppd  Vaping Use   Vaping status: Never Used  Substance and Sexual Activity   Alcohol  use: Not Currently   Drug use: No   Sexual activity: Not on file  Other Topics Concern   Not on file  Social History Narrative   Hx of smoking; quit prior to lung surgery then started back. Lives in [place] alone. Used to work in [employer], [employer]- Facilities manager. On disability sec to spinal pain.    Social Drivers of Health   Financial Resource Strain: Medium Risk (02/20/2024)   Overall Financial Resource Strain (CARDIA)    Difficulty of Paying Living Expenses: Somewhat hard  Food Insecurity: No Food Insecurity (02/20/2024)   Hunger Vital Sign    Worried About Running Out of Food in the Last Year: Never true    Ran Out of Food in the Last Year: Never true  Transportation Needs: No Transportation Needs (02/20/2024)   PRAPARE - Administrator, Civil Service (Medical): No    Lack of Transportation (Non-Medical): No  Physical Activity: Inactive (02/20/2024)   Exercise Vital Sign    Days of Exercise per Week: 0 days    Minutes of Exercise per Session: 0 min  Stress: Stress Concern Present  (02/20/2024)   [employer] of Occupational Health - Occupational Stress Questionnaire    Feeling of Stress : To some extent  Social Connections: Socially Isolated (02/20/2024)   Social Connection and Isolation Panel [NHANES]    Frequency of Communication with Friends and Family: More than three times a week    Frequency of Social Gatherings with Friends and Family: Once a week    Attends Religious Services: Never    Database administrator or Organizations: No    Attends Banker Meetings: Never    Marital Status: Divorced  Catering manager Violence: Not At Risk (02/20/2024)   Humiliation, Afraid, Rape, and Kick questionnaire    Fear of Current or Ex-Partner: No    Emotionally Abused: No  Physically Abused: No    Sexually Abused: No    FAMILY HISTORY: Family History  Problem Relation Age of Onset   Hypertension Mother    Coronary artery disease Mother    Heart attack Mother        acute   Cancer Mother    Alcohol  abuse Father    Depression Father    Hypertension Father    Heart attack Father 63       acute   Alcohol  abuse Sister    Hyperlipidemia Sister    Hypertension Sister    Cancer Sister 10   Heart attack Sister        x's 2   Coronary artery disease Sister 71       x's 2   Breast cancer Sister 7    ALLERGIES:  is allergic to atorvastatin, omeprazole, and bupropion .  MEDICATIONS:  Current Outpatient Medications  Medication Sig Dispense Refill   acetaminophen  (TYLENOL ) 500 MG tablet Take 500 mg by mouth every 6 (six) hours as needed for moderate pain (pain score 4-6).     albuterol  (VENTOLIN  HFA) 108 (90 Base) MCG/ACT inhaler Inhale 2 puffs into the lungs every 6 (six) hours as needed for wheezing or shortness of breath. 8.5 g 2   allopurinol  (ZYLOPRIM ) 100 MG tablet Take 1 tablet (100 mg total) by mouth daily. 90 tablet 4   aspirin  81 MG tablet Take 81 mg by mouth daily.      bisoprolol  (ZEBETA ) 10 MG tablet Take 1 tablet (10 mg total) by  mouth daily. 90 tablet 0   Budeson-Glycopyrrol-Formoterol  (BREZTRI  AEROSPHERE) 160-9-4.8 MCG/ACT AERO Inhale 2 puffs into the lungs in the morning and at bedtime. 1284 g 11   clopidogrel  (PLAVIX ) 75 MG tablet Take 1 tablet (75 mg total) by mouth daily. 90 tablet 0   ibuprofen (ADVIL) 200 MG tablet Take 200 mg by mouth 2 (two) times daily as needed (headaches).     lactulose  (CHRONULAC ) 10 GM/15ML solution Take 30 mLs (20 g total) by mouth 2 (two) times daily as needed for mild constipation. 236 mL 0   lidocaine -prilocaine  (EMLA ) cream Apply on the port. 30 -45 min  prior to port access. 30 g 3   lisinopril -hydrochlorothiazide  (ZESTORETIC ) 20-12.5 MG tablet Take 1 tablet by mouth daily. (Patient taking differently: Take 0.5 tablets by mouth daily. 1 tab alternating with 1/2 tab) 90 tablet 0   magnesium gluconate (MAGONATE) 500 MG tablet Take 500 mg by mouth at bedtime.     pantoprazole  (PROTONIX ) 20 MG tablet Take 1 tablet (20 mg total) by mouth daily. 90 tablet 4   Polyethyl Glyc-Propyl Glyc PF (SYSTANE PRESERVATIVE FREE) 0.4-0.3 % SOLN Place 1 drop into both eyes daily as needed (dry eyes).     prochlorperazine  (COMPAZINE ) 10 MG tablet Take 1 tablet (10 mg total) by mouth every 6 (six) hours as needed for nausea or vomiting. 45 tablet 1   rosuvastatin  (CRESTOR ) 20 MG tablet Take 1 tablet (20 mg total) by mouth daily. 90 tablet 4   Semaglutide , 1 MG/DOSE, (OZEMPIC , 1 MG/DOSE,) 4 MG/3ML SOPN Inject 1 mg into the skin once a week. 9 mL 1   oxyCODONE  (OXY IR/ROXICODONE ) 5 MG immediate release tablet Take 1-2 tablets (5-10 mg total) by mouth every 4 (four) hours as needed for moderate pain (pain score 4-6). (Patient not taking: Reported on 01/21/2024) 30 tablet 0   No current facility-administered medications for this visit.   Facility-Administered Medications Ordered in Other Visits  Medication Dose Route Frequency Provider Last Rate Last Admin   heparin  lock flush 100 UNIT/ML injection             heparin  lock flush 100 unit/mL  500 Units Intracatheter Once PRN Florene Brill R, MD       pembrolizumab  (KEYTRUDA ) 200 mg in sodium chloride  0.9 % 50 mL chemo infusion  200 mg Intravenous Once Kalianne Fetting R, MD       sodium chloride  flush (NS) 0.9 % injection 10 mL  10 mL Intravenous PRN Rhylan Kagel R, MD   10 mL at 03/31/24 0924    Mild maculopapular rash on the Maller area left more than right.  PHYSICAL EXAMINATION: ECOG PERFORMANCE STATUS: 1 - Symptomatic but completely ambulatory  Vitals:   03/31/24 0936  BP: 121/78  Pulse: 76  Resp: 16  Temp: (!) 97.1 F (36.2 C)  SpO2: 100%        Filed Weights   03/31/24 0936  Weight: 186 lb (84.4 kg)         Physical Exam HENT:     Head: Normocephalic and atraumatic.     Mouth/Throat:     Pharynx: No oropharyngeal exudate.  Eyes:     Pupils: Pupils are equal, round, and reactive to light.  Cardiovascular:     Rate and Rhythm: Normal rate and regular rhythm.  Pulmonary:     Comments: Decreased breath sounds bilaterally.  No wheeze or crackles Abdominal:     General: Bowel sounds are normal. There is no distension.     Palpations: Abdomen is soft. There is no mass.     Tenderness: There is no abdominal tenderness. There is no guarding or rebound.  Musculoskeletal:        General: No tenderness. Normal range of motion.     Cervical back: Normal range of motion and neck supple.  Skin:    General: Skin is warm.  Neurological:     Mental Status: She is alert and oriented to person, place, and time.  Psychiatric:        Mood and Affect: Affect normal.      LABORATORY DATA:  I have reviewed the data as listed Lab Results  Component Value Date   WBC 9.9 03/31/2024   HGB 14.7 03/31/2024   HCT 44.8 03/31/2024   MCV 85.0 03/31/2024   PLT 220 03/31/2024   Recent Labs    02/18/24 0808 03/11/24 0828 03/31/24 0924  NA 140 141 140  K 3.4* 4.0 3.8  CL 107 110 109  CO2 27 23 23   GLUCOSE 97  96 95  BUN 17 16 17   CREATININE 0.64 0.59 0.60  CALCIUM  9.3 9.2 9.0  GFRNONAA >60 >60 >60  PROT 6.5 6.7 6.4*  ALBUMIN 3.6 3.7 3.6  AST 18 19 19   ALT 14 15 14   ALKPHOS 96 82 80  BILITOT 0.7 0.6 0.5    RADIOGRAPHIC STUDIES: I have personally reviewed the radiological images as listed and agreed with the findings in the report. MM 3D SCREENING MAMMOGRAM BILATERAL BREAST Result Date: 03/06/2024 CLINICAL DATA:  Screening. EXAM: DIGITAL SCREENING BILATERAL MAMMOGRAM WITH TOMOSYNTHESIS AND CAD TECHNIQUE: Bilateral screening digital craniocaudal and mediolateral oblique mammograms were obtained. Bilateral screening digital breast tomosynthesis was performed. The images were evaluated with computer-aided detection. COMPARISON:  Previous exam(s). ACR Breast Density Category a: The breasts are almost entirely fatty. FINDINGS: There are no findings suspicious for malignancy. IMPRESSION: No mammographic evidence of malignancy. A result letter of this  screening mammogram will be mailed directly to the patient. RECOMMENDATION: Screening mammogram in one year. (Code:SM-B-01Y) BI-RADS CATEGORY  1: Negative. Electronically Signed   By: Alinda Apley M.D.   On: 03/06/2024 13:24      ASSESSMENT & PLAN:   Primary cancer of left lower lobe of lung (HCC) #JUNE 2023-STAGE IV--recurrent/metastatic disease with New left adrenal metastasis;  PET scan JUNE 20th- 2023-  Enlarged 4 cm hypermetabolic LEFT adrenal metastasis;  Suspicion of metastatic adenopathy to the LEFT hilum.  Part solid nodule in the RIGHT lower lobe without metabolic activity. 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 Currently on single agent Keytruda  [PD-L1 greater than 80%].   MARCH 3rd, 2025- CT CAP-  No evidence of recurrent or metastatic disease.  Ground-glass atypical pulmonary cysts in the right lower lobe- Stable.   # DEC  17th, 2024- last Keytruda . Given MARCH 3rd, 2-25- CT scans negative for any recurrent malignancy. Continue with keytruda  immunotherapy [until aug 2025 x2 years]-   # Nausea/vomiting- ? GI illness- resolved s/p compazine . Likely unrelated to keytruda - monitor for now.    # Mild Hypokalemia: discussed dietary supplement.stable  # Chronic pain: headaches/cervical pain/back pain [Dr.Naveira; pain doctor]  On NSAIDs [ monitor for now. OCT 2023- MRI brain- NEGATIVE for any metastatic disease.   Stable.  FEB 2024- MRI lumbar spine- degenerative disease- on ibuprofen prn- BID.  stable  # Left rib pain-Post throacotomy pain/tingling and numbness: stable [  DIS-Continue gabapentin  -300 mg TID; and then at extra at night prn].   stable  # CAD/ PVD- cramping- s/p evaluation [Dr.Schneir]; ? Carotid occlusion- s/p CEA surgery-  stable  # COPD-stable encouraged continue to avoid smoking; again counseled to quit smoking; s/p pulmonary stable  # ACP: [03/31/2024] Patient has stage IV disease which is usually incurable.  However patient is currently doing well without any evidence of progression.  Patient interested in continue current scope of therapy; and full code.  # IV Access :s/p  port placement stable  *AM appts- Monday- con-CT  PS # DISPOSITION:  #  keytruda  today # follow up in 3 weeks- -MD labs/port- cbc/cmp;TSH; possible Keytruda ;   # follow up in 6 weeks- -MD labs/port- cbc/cmp;possible Keytruda ;Dr. B  # 40 minutes face-to-face with the patient discussing the above plan of care; more than 50% of time spent on prognosis/ natural history; counseling and coordination.       Gwyn Leos, MD 03/31/2024 10:36 AM

## 2024-03-31 NOTE — Assessment & Plan Note (Addendum)
#  JUNE 2023-STAGE IV--recurrent/metastatic disease with New left adrenal metastasis;  PET scan JUNE 20th- 2023-  Enlarged 4 cm hypermetabolic LEFT adrenal metastasis;  Suspicion of metastatic adenopathy to the LEFT hilum.  Part solid nodule in the RIGHT lower lobe without metabolic activity. 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 Currently on single agent Keytruda  [PD-L1 greater than 80%].   MARCH 3rd, 2025- CT CAP-  No evidence of recurrent or metastatic disease.  Ground-glass atypical pulmonary cysts in the right lower lobe- Stable.   # DEC 17th, 2024- last Keytruda . Given MARCH 3rd, 2-25- CT scans negative for any recurrent malignancy. Continue with keytruda  immunotherapy [until aug 2025 x2 years]-   # Nausea/vomiting- ? GI illness- resolved s/p compazine . Likely unrelated to keytruda - monitor for now.    # Mild Hypokalemia: discussed dietary supplement.stable  # Chronic pain: headaches/cervical pain/back pain [Dr.Naveira; pain doctor]  On NSAIDs [ monitor for now. OCT 2023- MRI brain- NEGATIVE for any metastatic disease.   Stable.  FEB 2024- MRI lumbar spine- degenerative disease- on ibuprofen prn- BID.  stable  # Left rib pain-Post throacotomy pain/tingling and numbness: stable [  DIS-Continue gabapentin  -300 mg TID; and then at extra at night prn].   stable  # CAD/ PVD- cramping- s/p evaluation [Dr.Schneir]; ? Carotid occlusion- s/p CEA surgery-  stable  # COPD-stable encouraged continue to avoid smoking; again counseled to quit smoking; s/p pulmonary stable  # ACP: [03/31/2024] Patient has stage IV disease which is usually incurable.  However patient is currently doing well without any evidence of progression.  Patient interested in continue current scope of therapy; and full code.  # IV Access :s/p  port placement stable  *AM appts- Monday- con-CT  PS # DISPOSITION:  #   keytruda  today # follow up in 3 weeks- -MD labs/port- cbc/cmp;TSH; possible Keytruda ;   # follow up in 6 weeks- -MD labs/port- cbc/cmp;possible Keytruda ;Dr. B  # 40 minutes face-to-face with the patient discussing the above plan of care; more than 50% of time spent on prognosis/ natural history; counseling and coordination.

## 2024-03-31 NOTE — Progress Notes (Signed)
 Patient here for follow up, denies any concerns today.

## 2024-03-31 NOTE — Patient Instructions (Signed)
 [facility] - A DEPT OF [facility]  Discharge Instructions: Thank you for choosing [facility] to provide your oncology and hematology care.  If you have a lab appointment with the Cancer Center, please go directly to the Cancer Center and check in at the registration area.  Wear comfortable clothing and clothing appropriate for easy access to any Portacath or PICC line.   We strive to give you quality time with your provider. You may need to reschedule your appointment if you arrive late (15 or more minutes).  Arriving late affects you and other patients whose appointments are after yours.  Also, if you miss three or more appointments without notifying the office, you may be dismissed from the clinic at the provider's discretion.      For prescription refill requests, have your pharmacy contact our office and allow 72 hours for refills to be completed.    Today you received the following chemotherapy and/or immunotherapy agents KEYTRUDA      To help prevent nausea and vomiting after your treatment, we encourage you to take your nausea medication as directed.  BELOW ARE SYMPTOMS THAT SHOULD BE REPORTED IMMEDIATELY: *FEVER GREATER THAN 100.4 F (38 C) OR HIGHER *CHILLS OR SWEATING *NAUSEA AND VOMITING THAT IS NOT CONTROLLED WITH YOUR NAUSEA MEDICATION *UNUSUAL SHORTNESS OF BREATH *UNUSUAL BRUISING OR BLEEDING *URINARY PROBLEMS (pain or burning when urinating, or frequent urination) *BOWEL PROBLEMS (unusual diarrhea, constipation, pain near the anus) TENDERNESS IN MOUTH AND THROAT WITH OR WITHOUT PRESENCE OF ULCERS (sore throat, sores in mouth, or a toothache) UNUSUAL RASH, SWELLING OR PAIN  UNUSUAL VAGINAL DISCHARGE OR ITCHING   Items with * indicate a potential emergency and should be followed up as soon as possible or go to the Emergency Department if any problems should occur.  Please show the CHEMOTHERAPY ALERT CARD or IMMUNOTHERAPY  ALERT CARD at check-in to the Emergency Department and triage nurse.  Should you have questions after your visit or need to cancel or reschedule your appointment, please contact [facility] - A DEPT OF [facility] [place]  [phone] and follow the prompts.  Office hours are 8:00 a.m. to 4:30 p.m. Monday - Friday. Please note that voicemails left after 4:00 p.m. may not be returned until the following business day.  We are closed weekends and major holidays. You have access to a nurse at all times for urgent questions. Please call the main number to the clinic [phone] and follow the prompts.  For any non-urgent questions, you may also contact your provider using MyChart. We now offer e-Visits for anyone 71 and older to request care online for non-urgent symptoms. For details visit [email or web address].   Also download the MyChart app! Go to the app store, search "MyChart", open the app, select [place], and log in with your MyChart username and password.   Pembrolizumab Injection What is this medication? PEMBROLIZUMAB (PEM broe LIZ ue mab) treats some types of cancer. It works by helping your immune system slow or stop the spread of cancer cells. It is a monoclonal antibody. This medicine may be used for other purposes; ask your health care provider or pharmacist if you have questions. COMMON BRAND NAME(S): Keytruda What should I tell my care team before I take this medication? They need to know if you have any of these conditions: Allogeneic stem cell transplant (uses someone else's stem cells) Autoimmune diseases, such as Crohn  disease, ulcerative colitis, lupus History of chest radiation Nervous system problems, such as Guillain-Barre syndrome, myasthenia gravis Organ transplant An unusual or allergic reaction to pembrolizumab, other medications, foods, dyes, or preservatives Pregnant or trying to get pregnant Breast-feeding How should I use this  medication? This medication is injected into a vein. It is given by your care team in a hospital or clinic setting. A special MedGuide will be given to you before each treatment. Be sure to read this information carefully each time. Talk to your care team about the use of this medication in children. While it may be prescribed for children as young as 6 months for selected conditions, precautions do apply. Overdosage: If you think you have taken too much of this medicine contact a poison control center or emergency room at once. NOTE: This medicine is only for you. Do not share this medicine with others. What if I miss a dose? Keep appointments for follow-up doses. It is important not to miss your dose. Call your care team if you are unable to keep an appointment. What may interact with this medication? Interactions have not been studied. This list may not describe all possible interactions. Give your health care provider a list of all the medicines, herbs, non-prescription drugs, or dietary supplements you use. Also tell them if you smoke, drink alcohol, or use illegal drugs. Some items may interact with your medicine. What should I watch for while using this medication? Your condition will be monitored carefully while you are receiving this medication. You may need blood work while taking this medication. This medication may cause serious skin reactions. They can happen weeks to months after starting the medication. Contact your care team right away if you notice fevers or flu-like symptoms with a rash. The rash may be red or purple and then turn into blisters or peeling of the skin. You may also notice a red rash with swelling of the face, lips, or lymph nodes in your neck or under your arms. Tell your care team right away if you have any change in your eyesight. Talk to your care team if you may be pregnant. Serious birth defects can occur if you take this medication during pregnancy and for 4  months after the last dose. You will need a negative pregnancy test before starting this medication. Contraception is recommended while taking this medication and for 4 months after the last dose. Your care team can help you find the option that works for you. Do not breastfeed while taking this medication and for 4 months after the last dose. What side effects may I notice from receiving this medication? Side effects that you should report to your care team as soon as possible: Allergic reactions--skin rash, itching, hives, swelling of the face, lips, tongue, or throat Dry cough, shortness of breath or trouble breathing Eye pain, redness, irritation, or discharge with blurry or decreased vision Heart muscle inflammation--unusual weakness or fatigue, shortness of breath, chest pain, fast or irregular heartbeat, dizziness, swelling of the ankles, feet, or hands Hormone gland problems--headache, sensitivity to light, unusual weakness or fatigue, dizziness, fast or irregular heartbeat, increased sensitivity to cold or heat, excessive sweating, constipation, hair loss, increased thirst or amount of urine, tremors or shaking, irritability Infusion reactions--chest pain, shortness of breath or trouble breathing, feeling faint or lightheaded Kidney injury (glomerulonephritis)--decrease in the amount of urine, red or dark brown urine, foamy or bubbly urine, swelling of the ankles, hands, or feet Liver injury--right upper belly pain,  loss of appetite, nausea, light-colored stool, dark yellow or brown urine, yellowing skin or eyes, unusual weakness or fatigue Pain, tingling, or numbness in the hands or feet, muscle weakness, change in vision, confusion or trouble speaking, loss of balance or coordination, trouble walking, seizures Rash, fever, and swollen lymph nodes Redness, blistering, peeling, or loosening of the skin, including inside the mouth Sudden or severe stomach pain, bloody diarrhea, fever, nausea,  vomiting Side effects that usually do not require medical attention (report to your care team if they continue or are bothersome): Bone, joint, or muscle pain Diarrhea Fatigue Loss of appetite Nausea Skin rash This list may not describe all possible side effects. Call your doctor for medical advice about side effects. You may report side effects to FDA at 1-800-FDA-1088. Where should I keep my medication? This medication is given in a hospital or clinic. It will not be stored at home. NOTE: This sheet is a summary. It may not cover all possible information. If you have questions about this medicine, talk to your doctor, pharmacist, or health care provider.  2024 Elsevier/Gold Standard (2022-04-04 00:00:00)

## 2024-04-15 ENCOUNTER — Other Ambulatory Visit: Payer: Self-pay | Admitting: Family Medicine

## 2024-04-15 DIAGNOSIS — I10 Essential (primary) hypertension: Secondary | ICD-10-CM

## 2024-04-21 ENCOUNTER — Inpatient Hospital Stay

## 2024-04-21 ENCOUNTER — Inpatient Hospital Stay: Attending: Internal Medicine | Admitting: Internal Medicine

## 2024-04-21 ENCOUNTER — Encounter: Payer: Self-pay | Admitting: Internal Medicine

## 2024-04-21 VITALS — BP 118/79 | HR 72 | Temp 97.8°F | Resp 17 | Ht 68.0 in | Wt 184.0 lb

## 2024-04-21 VITALS — BP 112/68 | HR 69 | Temp 96.6°F | Resp 18

## 2024-04-21 DIAGNOSIS — F17210 Nicotine dependence, cigarettes, uncomplicated: Secondary | ICD-10-CM | POA: Insufficient documentation

## 2024-04-21 DIAGNOSIS — C7972 Secondary malignant neoplasm of left adrenal gland: Secondary | ICD-10-CM | POA: Insufficient documentation

## 2024-04-21 DIAGNOSIS — C3432 Malignant neoplasm of lower lobe, left bronchus or lung: Secondary | ICD-10-CM | POA: Insufficient documentation

## 2024-04-21 DIAGNOSIS — Z79899 Other long term (current) drug therapy: Secondary | ICD-10-CM | POA: Diagnosis not present

## 2024-04-21 DIAGNOSIS — Z79620 Long term (current) use of immunosuppressive biologic: Secondary | ICD-10-CM | POA: Insufficient documentation

## 2024-04-21 DIAGNOSIS — Z5112 Encounter for antineoplastic immunotherapy: Secondary | ICD-10-CM | POA: Insufficient documentation

## 2024-04-21 LAB — CBC WITH DIFFERENTIAL/PLATELET
Abs Immature Granulocytes: 0.07 10*3/uL (ref 0.00–0.07)
Basophils Absolute: 0.1 10*3/uL (ref 0.0–0.1)
Basophils Relative: 1 %
Eosinophils Absolute: 0.8 10*3/uL — ABNORMAL HIGH (ref 0.0–0.5)
Eosinophils Relative: 11 %
HCT: 42.4 % (ref 36.0–46.0)
Hemoglobin: 14.3 g/dL (ref 12.0–15.0)
Immature Granulocytes: 1 %
Lymphocytes Relative: 32 %
Lymphs Abs: 2.4 10*3/uL (ref 0.7–4.0)
MCH: 28.3 pg (ref 26.0–34.0)
MCHC: 33.7 g/dL (ref 30.0–36.0)
MCV: 83.8 fL (ref 80.0–100.0)
Monocytes Absolute: 0.6 10*3/uL (ref 0.1–1.0)
Monocytes Relative: 7 %
Neutro Abs: 3.6 10*3/uL (ref 1.7–7.7)
Neutrophils Relative %: 48 %
Platelets: 198 10*3/uL (ref 150–400)
RBC: 5.06 MIL/uL (ref 3.87–5.11)
RDW: 14.5 % (ref 11.5–15.5)
WBC: 7.5 10*3/uL (ref 4.0–10.5)
nRBC: 0 % (ref 0.0–0.2)

## 2024-04-21 LAB — COMPREHENSIVE METABOLIC PANEL WITH GFR
ALT: 14 U/L (ref 0–44)
AST: 17 U/L (ref 15–41)
Albumin: 3.6 g/dL (ref 3.5–5.0)
Alkaline Phosphatase: 87 U/L (ref 38–126)
Anion gap: 8 (ref 5–15)
BUN: 18 mg/dL (ref 8–23)
CO2: 25 mmol/L (ref 22–32)
Calcium: 9.1 mg/dL (ref 8.9–10.3)
Chloride: 108 mmol/L (ref 98–111)
Creatinine, Ser: 0.64 mg/dL (ref 0.44–1.00)
GFR, Estimated: >60 mL/min (ref >60)
Glucose, Bld: 95 mg/dL (ref 70–99)
Potassium: 3.5 mmol/L (ref 3.5–5.1)
Sodium: 141 mmol/L (ref 135–145)
Total Bilirubin: 0.8 mg/dL (ref 0.0–1.2)
Total Protein: 6.5 g/dL (ref 6.5–8.1)

## 2024-04-21 LAB — TSH: TSH: 1.63 u[IU]/mL (ref 0.350–4.500)

## 2024-04-21 MED ORDER — SODIUM CHLORIDE 0.9 % IV SOLN
Freq: Once | INTRAVENOUS | Status: AC
  Filled 2024-04-21: qty 250

## 2024-04-21 MED ORDER — SODIUM CHLORIDE 0.9 % IV SOLN
200.0000 mg | Freq: Once | INTRAVENOUS | Status: AC
  Administered 2024-04-21: 200 mg via INTRAVENOUS
  Filled 2024-04-21: qty 8

## 2024-04-21 MED ORDER — HEPARIN SOD (PORK) LOCK FLUSH 100 UNIT/ML IV SOLN
500.0000 [IU] | Freq: Once | INTRAVENOUS | Status: AC | PRN
  Administered 2024-04-21: 500 [IU]
  Filled 2024-04-21: qty 5

## 2024-04-21 NOTE — Progress Notes (Signed)
 Patient here for oncology follow-up appointment,concerns of consistent headache

## 2024-04-21 NOTE — Patient Instructions (Signed)

## 2024-04-21 NOTE — Assessment & Plan Note (Signed)
#  JUNE 2023-STAGE IV--recurrent/metastatic disease with New left adrenal metastasis;  PET scan JUNE 20th- 2023-  Enlarged 4 cm hypermetabolic LEFT adrenal metastasis;  Suspicion of metastatic adenopathy to the LEFT hilum.  Part solid nodule in the RIGHT lower lobe without metabolic activity. 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 Currently on single agent Keytruda  [PD-L1 greater than 80%].   MARCH 3rd, 2025- CT CAP-  No evidence of recurrent or metastatic disease.  Ground-glass atypical pulmonary cysts in the right lower lobe- Stable.   # DEC 17th, 2024- last Keytruda . Given MARCH 3rd, 2025- CT scans negative for any recurrent malignancy. Continue with keytruda  immunotherapy [until aug 2025 x2 years]-   # Nausea/vomiting- ? GI illness- resolved s/p compazine . Likely unrelated to keytruda - monitor for now.    # Mild Hypokalemia: discussed dietary supplement.stable  # Chronic pain: headaches/cervical pain/back pain [Dr.Naveira; pain doctor]  On NSAIDs [ monitor for now. OCT 2023- MRI brain- NEGATIVE for any metastatic disease.   Stable.  FEB 2024- MRI lumbar spine- degenerative disease- on ibuprofen prn- BID.  stable  # Left rib pain-Post throacotomy pain/tingling and numbness: stable [  DIS-Continue gabapentin  -300 mg TID; and then at extra at night prn].   stable  # CAD/ PVD- cramping- s/p evaluation [Dr.Schneir]; ? Carotid occlusion- s/p CEA surgery-  stable  # COPD-stable encouraged continue to avoid smoking; again counseled to quit smoking; s/p pulmonary stable  # ACP: [03/31/2024] -Full code.  # IV Access :s/p  port placement stable  *AM appts- Monday- con-CT  PS # DISPOSITION:  #  keytruda  today # follow up in 3 weeks- -MD labs/port- cbc/cmp;TSH; possible Keytruda ;   # follow up in 6 weeks- -MD labs/port- cbc/cmp;possible Keytruda ;Dr. Geraldene Kleine

## 2024-04-21 NOTE — Progress Notes (Signed)
 [facility] CONSULT NOTE  Patient Care Team: Lamon Pillow, MD as PCP - General (Family Medicine) Schnier, Ninette Basque, MD (Vascular Surgery) Irby Mannan, MD (Inactive) as Consulting Physician (Gastroenterology) Drake Gens, RN as Oncology Nurse Navigator Gwyn Leos, MD as Consulting Physician (Oncology) Vergia Glasgow, MD as Consulting Physician (Pulmonary Disease) Percival Brace, MD as Consulting Physician (Cardiology) Trudi Fus, MD as Consulting Physician (Ophthalmology)   CHIEF COMPLAINTS/PURPOSE OF CONSULTATION: lung cancer  #  Oncology History Overview Note  #MAY 2022- LLL nodule 2.3 cm Adenocarcinoma Stage IA; s/p lobectomy.  No adjuvant therapy  # CT scan 4th June 2023-highly concerning for recurrent/metastatic disease with-. New left adrenal metastasis;  Ground-glass and part solid nodules in the peripheral right lower lobe, unchanged from 11/07/2021 but slightly enlarged from 01/01/2018. Findings are suspicious for indolent adenocarcinoma.  F One- PFL1 =80%; KRAS G12C PET scan JUNE 20th- 2023-  Enlarged 4 cm hypermetabolic LEFT adrenal metastasis;  Suspicion of metastatic adenopathy to the LEFT hilum.  Part solid nodule in the RIGHT lower lobe without metabolic activity.   3 ADRENAL BIOPSY- CK7 POSITIVE ADENOCARCINOMA- There is limited tissue remaining for ancillary testing, not likely  sufficient for NGS testing.   # AUG 7th, 2023-single agent Keytruda .    Right mildly complex cystic adnexal mass noted-June 13th, 2024- Significant interval enlargement of a multiseptated cystic lesion of the right ovary, now measuring 11.7 x 8.2 cm, previously 8.1 x 5.8 cm.- s/p in [facility] [October, 2024]- mucinous cystadenoma - stable.   Primary cancer of left lower lobe of lung (HCC)  05/09/2021 Initial Diagnosis   Primary cancer of left lower lobe of lung (HCC)   07/04/2022 -  Chemotherapy   Patient is on Treatment Plan : LUNG NSCLC  Pembrolizumab  (200) q21d     07/10/2022 - 07/10/2022 Chemotherapy   Patient is on Treatment Plan : LUNG NSCLC Pembrolizumab  (200) q21d      HISTORY OF PRESENTING ILLNESS: Patient  ambulating.  Alone.   Laura Nielsen 68 y.o.  female history of smoking; prior history of lung cancer-with likely recurrence to left adrenal gland [s/p Bx CK-7 positive TTF-1 negative]-clinically suggestive of recurrent lung cancer on single agent Keytruda  is here for follow-up.  Patient here for oncology follow-up appointment,concerns of consistent headache. But this is chronic. Not any worse.   Patient had one more episode  of  nauseated  with one episode of diarrhea. She took compazine  and then resolved.    Denies any abdominal pain. Otherwise patient continues to have mild shortness of breath on exertion. Complains of cramping in the legs chronic.   Review of Systems  Constitutional:  Negative for chills, diaphoresis, fever, malaise/fatigue and weight loss.  HENT:  Negative for nosebleeds and sore throat.   Eyes:  Negative for double vision.  Respiratory:  Negative for cough, hemoptysis, sputum production, shortness of breath and wheezing.   Cardiovascular:  Negative for palpitations, orthopnea and leg swelling.  Gastrointestinal:  Negative for abdominal pain, blood in stool, constipation, diarrhea, heartburn, melena, nausea and vomiting.  Genitourinary:  Negative for dysuria, frequency and urgency.  Musculoskeletal:  Positive for back pain and joint pain.  Skin: Negative.  Negative for itching and rash.  Neurological:  Positive for tingling. Negative for dizziness, focal weakness, weakness and headaches.  Endo/Heme/Allergies:  Does not bruise/bleed easily.  Psychiatric/Behavioral:  Negative for depression. The patient is not nervous/anxious and does not have insomnia.      MEDICAL HISTORY:  Past  Medical History:  Diagnosis Date   AAA (abdominal aortic aneurysm) (HCC)    a.) s/p EVAR stent graft  repair 03/27/2013   Adnexal mass    Aortic atherosclerosis (HCC)    Arthritis    Asthma    Atherosclerosis of native artery of both lower extremities with intermittent claudication (HCC)    B12 deficiency    Bilateral carotid artery stenosis    a.) doppler 05/06/2020 and 06/30/2021: 1-39% BICA; b.) doppler 11/20/2022: 60-79% RICA; c.) CTA neck 08/17/2023: >75% RICA, 30% LICA   CAD (coronary artery disease) 03/16/2009   a.) LHC/PCI 03/16/2009: 25% mLCx, 70% mRCA (3 x 18 mm Xience V DES); b.) MV 10/22/2017 and 03/16/2021: no ischemia; c.) MV 07/16/2023: mild apical ischemia   Cancer of left adrenal gland (HCC) 06/20/2022   a.) CNB 06/20/2022 -> pathology (+) for CK7 (TTF-1 -) adenocarcinoma   Centrilobular emphysema (HCC)    Cervical spinal stenosis    Cervical spondylosis with radiculopathy    Chronic back pain    Colon polyp    Complex ovarian cyst    Complication of anesthesia    a.) prone to experience postoperative hypotension   COPD (chronic obstructive pulmonary disease) (HCC)    DDD (degenerative disc disease), cervical    DDD (degenerative disc disease), thoracolumbar    Depression    Diabetes mellitus type 2, controlled (HCC)    Diastolic dysfunction 06/29/2023   a.) TTE 06/29/2023: EF >55%, no RWMAs, G1DD, triv PR, mild MR/TR   Fatty liver    GERD (gastroesophageal reflux disease)    Gout    History of bilateral cataract extraction    History of kidney stones    Hypercholesteremia    Long term (current) use of aspirin     Long term current use of clopidogrel     Lumbar spinal stenosis    Lumbar spondylosis    Non-small cell lung cancer metastatic to adrenal gland (HCC)    a.) s/p LLL wedge resection 04/06/2021 --> pathology (+) for well differentiated adenocarcinoma (stage 1A) --> no adjuvant Tx; b.) recurrent stage IV adenocarcinoma 05/2022 --> Tx'd with  pembrolizumab    Osteoarthritis    Osteoporosis    Primary hypertension    Seasonal allergies    Sleep apnea     a.) no nocturnal PAP therapy since 2021   Vitamin D  deficiency     SURGICAL HISTORY: Past Surgical History:  Procedure Laterality Date   ABDOMINAL AORTIC ENDOVASCULAR STENT GRAFT  03/27/2013   Dr. Devon Fogo   ABDOMINAL HYSTERECTOMY     Menometrorrhagia. Excessive bleeding. Unknown if cervix removed.    BILATERAL SALPINGOOPHORECTOMY  09/27/2023   CATARACT EXTRACTION W/PHACO Left 05/14/2023   Procedure: CATARACT EXTRACTION PHACO AND INTRAOCULAR LENS PLACEMENT (IOC) LEFT DIABETIC  OMIDRIA   19.40  01:34.8;  Surgeon: Trudi Fus, MD;  Location: [facility];  Service: Ophthalmology;  Laterality: Left;   CATARACT EXTRACTION W/PHACO Right 06/27/2023   Procedure: CATARACT EXTRACTION PHACO AND INTRAOCULAR LENS PLACEMENT (IOC) RIGHT DIABETIC  6.47  00:42.6;  Surgeon: Trudi Fus, MD;  Location: [facility];  Service: Ophthalmology;  Laterality: Right;   COLONOSCOPY WITH PROPOFOL  N/A 01/05/2021   Procedure: COLONOSCOPY WITH PROPOFOL ;  Surgeon: Irby Mannan, MD;  Location: [facility];  Service: Endoscopy;  Laterality: N/A;   CORONARY ANGIOPLASTY WITH STENT PLACEMENT  03/16/2009   Procedure: CORONARY ANGIOPLASTY WITH STENT PLACEMENT; Location: [facility]; Surgeons: Starlette Ebbs, MD (diagnostic) and Antionette Kirks, MD (interventional)   ENDARTERECTOMY Right 11/09/2023  Procedure: ENDARTERECTOMY CAROTID;  Surgeon: Jackquelyn Mass, MD;  Location: [facility];  Service: Vascular;  Laterality: Right;   ESOPHAGOGASTRODUODENOSCOPY (EGD) WITH PROPOFOL  N/A 01/05/2021   Procedure: ESOPHAGOGASTRODUODENOSCOPY (EGD) WITH PROPOFOL ;  Surgeon: Irby Mannan, MD;  Location: [facility];  Service: Endoscopy;  Laterality: N/A;   EXTRACORPOREAL SHOCK WAVE LITHOTRIPSY Right 1999   INTERCOSTAL NERVE BLOCK Left 04/06/2021   Procedure: INTERCOSTAL NERVE BLOCK;  Surgeon: Zelphia Higashi, MD;  Location: [facility] OR;  Service: Thoracic;  Laterality: Left;   IR IMAGING  GUIDED PORT INSERTION  06/20/2022   LUNG LOBECTOMY Left 04/06/2021   robotic left lower lobectomy 04/06/2021 Dr. Luna Salinas for Stage 1A adenocarcinoma   NODE DISSECTION Left 04/06/2021   Procedure: NODE DISSECTION;  Surgeon: Zelphia Higashi, MD;  Location: [facility] OR;  Service: Thoracic;  Laterality: Left;   TUBAL LIGATION      SOCIAL HISTORY: Social History   Socioeconomic History   Marital status: Divorced    Spouse name: Not on file   Number of children: 2   Years of education: H/S   Highest education level: 12th grade  Occupational History   Occupation: Disabled   Occupation: retired  Tobacco Use   Smoking status: Every Day    Current packs/day: 1.00    Average packs/day: 1 pack/day for 51.4 years (51.4 ttl pk-yrs)    Types: Cigarettes    Start date: 1974   Smokeless tobacco: Never   Tobacco comments:    12/23/19 states she quit for 9 months and then started back.     02/20/24 1/2 ppd  Vaping Use   Vaping status: Never Used  Substance and Sexual Activity   Alcohol  use: Not Currently   Drug use: No   Sexual activity: Not on file  Other Topics Concern   Not on file  Social History Narrative   Hx of smoking; quit prior to lung surgery then started back. Lives in [place] alone. Used to work in [employer], [employer]- Facilities manager. On disability sec to spinal pain.    Social Drivers of Health   Financial Resource Strain: Medium Risk (02/20/2024)   Overall Financial Resource Strain (CARDIA)    Difficulty of Paying Living Expenses: Somewhat hard  Food Insecurity: No Food Insecurity (02/20/2024)   Hunger Vital Sign    Worried About Running Out of Food in the Last Year: Never true    Ran Out of Food in the Last Year: Never true  Transportation Needs: No Transportation Needs (02/20/2024)   PRAPARE - Administrator, Civil Service (Medical): No    Lack of Transportation (Non-Medical): No  Physical Activity: Inactive (02/20/2024)   Exercise Vital Sign    Days of Exercise per  Week: 0 days    Minutes of Exercise per Session: 0 min  Stress: Stress Concern Present (02/20/2024)   [employer] of Occupational Health - Occupational Stress Questionnaire    Feeling of Stress : To some extent  Social Connections: Socially Isolated (02/20/2024)   Social Connection and Isolation Panel [NHANES]    Frequency of Communication with Friends and Family: More than three times a week    Frequency of Social Gatherings with Friends and Family: Once a week    Attends Religious Services: Never    Database administrator or Organizations: No    Attends Banker Meetings: Never    Marital Status: Divorced  Catering manager Violence: Not At Risk (02/20/2024)   Humiliation, Afraid, Rape, and Kick questionnaire  Fear of Current or Ex-Partner: No    Emotionally Abused: No    Physically Abused: No    Sexually Abused: No    FAMILY HISTORY: Family History  Problem Relation Age of Onset   Hypertension Mother    Coronary artery disease Mother    Heart attack Mother        acute   Cancer Mother    Alcohol  abuse Father    Depression Father    Hypertension Father    Heart attack Father 79       acute   Alcohol  abuse Sister    Hyperlipidemia Sister    Hypertension Sister    Cancer Sister 51   Heart attack Sister        x's 2   Coronary artery disease Sister 62       x's 2   Breast cancer Sister 51    ALLERGIES:  is allergic to atorvastatin, omeprazole, and bupropion .  MEDICATIONS:  Current Outpatient Medications  Medication Sig Dispense Refill   acetaminophen  (TYLENOL ) 500 MG tablet Take 500 mg by mouth every 6 (six) hours as needed for moderate pain (pain score 4-6).     albuterol  (VENTOLIN  HFA) 108 (90 Base) MCG/ACT inhaler Inhale 2 puffs into the lungs every 6 (six) hours as needed for wheezing or shortness of breath. 8.5 g 2   allopurinol  (ZYLOPRIM ) 100 MG tablet Take 1 tablet (100 mg total) by mouth daily. 90 tablet 4   aspirin  81 MG tablet Take 81 mg  by mouth daily.      bisoprolol  (ZEBETA ) 10 MG tablet Take 1 tablet (10 mg total) by mouth daily. 90 tablet 0   Budeson-Glycopyrrol-Formoterol  (BREZTRI  AEROSPHERE) 160-9-4.8 MCG/ACT AERO Inhale 2 puffs into the lungs in the morning and at bedtime. 1284 g 11   clopidogrel  (PLAVIX ) 75 MG tablet Take 1 tablet (75 mg total) by mouth daily. 90 tablet 0   ibuprofen (ADVIL) 200 MG tablet Take 200 mg by mouth 2 (two) times daily as needed (headaches).     lactulose  (CHRONULAC ) 10 GM/15ML solution Take 30 mLs (20 g total) by mouth 2 (two) times daily as needed for mild constipation. 236 mL 0   lidocaine -prilocaine  (EMLA ) cream Apply on the port. 30 -45 min  prior to port access. 30 g 3   lisinopril -hydrochlorothiazide  (ZESTORETIC ) 20-12.5 MG tablet Take 1 tablet by mouth daily. 90 tablet 2   magnesium gluconate (MAGONATE) 500 MG tablet Take 500 mg by mouth at bedtime.     pantoprazole  (PROTONIX ) 20 MG tablet Take 1 tablet (20 mg total) by mouth daily. 90 tablet 4   Polyethyl Glyc-Propyl Glyc PF (SYSTANE PRESERVATIVE FREE) 0.4-0.3 % SOLN Place 1 drop into both eyes daily as needed (dry eyes).     prochlorperazine  (COMPAZINE ) 10 MG tablet Take 1 tablet (10 mg total) by mouth every 6 (six) hours as needed for nausea or vomiting. 45 tablet 1   rosuvastatin  (CRESTOR ) 20 MG tablet Take 1 tablet (20 mg total) by mouth daily. 90 tablet 4   Semaglutide , 1 MG/DOSE, (OZEMPIC , 1 MG/DOSE,) 4 MG/3ML SOPN Inject 1 mg into the skin once a week. 9 mL 1   oxyCODONE  (OXY IR/ROXICODONE ) 5 MG immediate release tablet Take 1-2 tablets (5-10 mg total) by mouth every 4 (four) hours as needed for moderate pain (pain score 4-6). (Patient not taking: Reported on 04/21/2024) 30 tablet 0   No current facility-administered medications for this visit.   Facility-Administered Medications Ordered in Other Visits  Medication Dose Route Frequency Provider Last Rate Last Admin   heparin  lock flush 100 UNIT/ML injection            heparin   lock flush 100 unit/mL  500 Units Intracatheter Once PRN Rushil Kimbrell R, MD       pembrolizumab  (KEYTRUDA ) 200 mg in sodium chloride  0.9 % 50 mL chemo infusion  200 mg Intravenous Once Daisy Mcneel R, MD        Mild maculopapular rash on the Maller area left more than right.  PHYSICAL EXAMINATION: ECOG PERFORMANCE STATUS: 1 - Symptomatic but completely ambulatory  Vitals:   04/21/24 0837  BP: 118/79  Pulse: 72  Resp: 17  Temp: 97.8 F (36.6 C)  SpO2: 100%        Filed Weights   04/21/24 0837  Weight: 184 lb (83.5 kg)         Physical Exam HENT:     Head: Normocephalic and atraumatic.     Mouth/Throat:     Pharynx: No oropharyngeal exudate.  Eyes:     Pupils: Pupils are equal, round, and reactive to light.  Cardiovascular:     Rate and Rhythm: Normal rate and regular rhythm.  Pulmonary:     Comments: Decreased breath sounds bilaterally.  No wheeze or crackles Abdominal:     General: Bowel sounds are normal. There is no distension.     Palpations: Abdomen is soft. There is no mass.     Tenderness: There is no abdominal tenderness. There is no guarding or rebound.  Musculoskeletal:        General: No tenderness. Normal range of motion.     Cervical back: Normal range of motion and neck supple.  Skin:    General: Skin is warm.  Neurological:     Mental Status: She is alert and oriented to person, place, and time.  Psychiatric:        Mood and Affect: Affect normal.      LABORATORY DATA:  I have reviewed the data as listed Lab Results  Component Value Date   WBC 7.5 04/21/2024   HGB 14.3 04/21/2024   HCT 42.4 04/21/2024   MCV 83.8 04/21/2024   PLT 198 04/21/2024   Recent Labs    03/11/24 0828 03/31/24 0924 04/21/24 0820  NA 141 140 141  K 4.0 3.8 3.5  CL 110 109 108  CO2 23 23 25   GLUCOSE 96 95 95  BUN 16 17 18   CREATININE 0.59 0.60 0.64  CALCIUM  9.2 9.0 9.1  GFRNONAA >60 >60 >60  PROT 6.7 6.4* 6.5  ALBUMIN 3.7 3.6 3.6   AST 19 19 17   ALT 15 14 14   ALKPHOS 82 80 87  BILITOT 0.6 0.5 0.8    RADIOGRAPHIC STUDIES: I have personally reviewed the radiological images as listed and agreed with the findings in the report. No results found.     ASSESSMENT & PLAN:   Primary cancer of left lower lobe of lung (HCC) #JUNE 2023-STAGE IV--recurrent/metastatic disease with New left adrenal metastasis;  PET scan JUNE 20th- 2023-  Enlarged 4 cm hypermetabolic LEFT adrenal metastasis;  Suspicion of metastatic adenopathy to the LEFT hilum.  Part solid nodule in the RIGHT lower lobe without metabolic activity. 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 Currently on single agent Keytruda  [PD-L1 greater than 80%].   MARCH 3rd, 2025- CT CAP-  No evidence of  recurrent or metastatic disease.  Ground-glass atypical pulmonary cysts in the right lower lobe- Stable.   # DEC 17th, 2024- last Keytruda . Given MARCH 3rd, 2025- CT scans negative for any recurrent malignancy. Continue with keytruda  immunotherapy [until aug 2025 x2 years]-   # Nausea/vomiting- ? GI illness- resolved s/p compazine . Likely unrelated to keytruda - monitor for now.    # Mild Hypokalemia: discussed dietary supplement.stable  # Chronic pain: headaches/cervical pain/back pain [Dr.Naveira; pain doctor]  On NSAIDs [ monitor for now. OCT 2023- MRI brain- NEGATIVE for any metastatic disease.   Stable.  FEB 2024- MRI lumbar spine- degenerative disease- on ibuprofen prn- BID.  stable  # Left rib pain-Post throacotomy pain/tingling and numbness: stable [  DIS-Continue gabapentin  -300 mg TID; and then at extra at night prn].   stable  # CAD/ PVD- cramping- s/p evaluation [Dr.Schneir]; ? Carotid occlusion- s/p CEA surgery-  stable  # COPD-stable encouraged continue to avoid smoking; again counseled to quit smoking; s/p pulmonary stable  # ACP: [03/31/2024]  -Full code.  # IV Access :s/p  port placement stable  *AM appts- Monday- con-CT  PS # DISPOSITION:  #  keytruda  today # follow up in 3 weeks- -MD labs/port- cbc/cmp;TSH; possible Keytruda ;   # follow up in 6 weeks- -MD labs/port- cbc/cmp;possible Keytruda ;Dr. Alphonzo Ask, MD 04/21/2024 9:21 AM

## 2024-04-21 NOTE — Patient Instructions (Signed)
 [facility] - A DEPT OF [facility]  Discharge Instructions: Thank you for choosing [facility] to provide your oncology and hematology care.  If you have a lab appointment with the Cancer Center, please go directly to the Cancer Center and check in at the registration area.  Wear comfortable clothing and clothing appropriate for easy access to any Portacath or PICC line.   We strive to give you quality time with your provider. You may need to reschedule your appointment if you arrive late (15 or more minutes).  Arriving late affects you and other patients whose appointments are after yours.  Also, if you miss three or more appointments without notifying the office, you may be dismissed from the clinic at the provider's discretion.      For prescription refill requests, have your pharmacy contact our office and allow 72 hours for refills to be completed.    Today you received the following chemotherapy and/or immunotherapy agents Rande Lawman      To help prevent nausea and vomiting after your treatment, we encourage you to take your nausea medication as directed.  BELOW ARE SYMPTOMS THAT SHOULD BE REPORTED IMMEDIATELY: *FEVER GREATER THAN 100.4 F (38 C) OR HIGHER *CHILLS OR SWEATING *NAUSEA AND VOMITING THAT IS NOT CONTROLLED WITH YOUR NAUSEA MEDICATION *UNUSUAL SHORTNESS OF BREATH *UNUSUAL BRUISING OR BLEEDING *URINARY PROBLEMS (pain or burning when urinating, or frequent urination) *BOWEL PROBLEMS (unusual diarrhea, constipation, pain near the anus) TENDERNESS IN MOUTH AND THROAT WITH OR WITHOUT PRESENCE OF ULCERS (sore throat, sores in mouth, or a toothache) UNUSUAL RASH, SWELLING OR PAIN  UNUSUAL VAGINAL DISCHARGE OR ITCHING   Items with * indicate a potential emergency and should be followed up as soon as possible or go to the Emergency Department if any problems should occur.  Please show the CHEMOTHERAPY ALERT CARD or IMMUNOTHERAPY  ALERT CARD at check-in to the Emergency Department and triage nurse.  Should you have questions after your visit or need to cancel or reschedule your appointment, please contact [facility] - A DEPT OF [facility]  [phone] and follow the prompts.  Office hours are 8:00 a.m. to 4:30 p.m. Monday - Friday. Please note that voicemails left after 4:00 p.m. may not be returned until the following business day.  We are closed weekends and major holidays. You have access to a nurse at all times for urgent questions. Please call the main number to the clinic [phone] and follow the prompts.  For any non-urgent questions, you may also contact your provider using MyChart. We now offer e-Visits for anyone 48 and older to request care online for non-urgent symptoms. For details visit [email or web address].   Also download the MyChart app! Go to the app store, search "MyChart", open the app, select [facility], and log in with your MyChart username and password.

## 2024-04-22 ENCOUNTER — Encounter (INDEPENDENT_AMBULATORY_CARE_PROVIDER_SITE_OTHER): Payer: Self-pay

## 2024-04-22 LAB — T4: T4, Total: 7.5 ug/dL (ref 4.5–12.0)

## 2024-04-23 ENCOUNTER — Other Ambulatory Visit: Payer: Self-pay | Admitting: Family Medicine

## 2024-04-23 DIAGNOSIS — I10 Essential (primary) hypertension: Secondary | ICD-10-CM

## 2024-05-12 ENCOUNTER — Inpatient Hospital Stay (HOSPITAL_BASED_OUTPATIENT_CLINIC_OR_DEPARTMENT_OTHER): Admitting: Internal Medicine

## 2024-05-12 ENCOUNTER — Inpatient Hospital Stay

## 2024-05-12 ENCOUNTER — Encounter: Payer: Self-pay | Admitting: Internal Medicine

## 2024-05-12 ENCOUNTER — Inpatient Hospital Stay: Attending: Internal Medicine

## 2024-05-12 VITALS — BP 103/67 | HR 67

## 2024-05-12 VITALS — BP 102/72 | HR 70 | Temp 96.6°F | Resp 16 | Ht 68.0 in | Wt 180.3 lb

## 2024-05-12 DIAGNOSIS — E559 Vitamin D deficiency, unspecified: Secondary | ICD-10-CM | POA: Diagnosis not present

## 2024-05-12 DIAGNOSIS — Z5112 Encounter for antineoplastic immunotherapy: Secondary | ICD-10-CM | POA: Insufficient documentation

## 2024-05-12 DIAGNOSIS — E039 Hypothyroidism, unspecified: Secondary | ICD-10-CM

## 2024-05-12 DIAGNOSIS — C3432 Malignant neoplasm of lower lobe, left bronchus or lung: Secondary | ICD-10-CM | POA: Insufficient documentation

## 2024-05-12 DIAGNOSIS — Z79899 Other long term (current) drug therapy: Secondary | ICD-10-CM | POA: Diagnosis not present

## 2024-05-12 DIAGNOSIS — C7972 Secondary malignant neoplasm of left adrenal gland: Secondary | ICD-10-CM | POA: Insufficient documentation

## 2024-05-12 DIAGNOSIS — Z79620 Long term (current) use of immunosuppressive biologic: Secondary | ICD-10-CM | POA: Insufficient documentation

## 2024-05-12 DIAGNOSIS — F17210 Nicotine dependence, cigarettes, uncomplicated: Secondary | ICD-10-CM | POA: Diagnosis not present

## 2024-05-12 LAB — COMPREHENSIVE METABOLIC PANEL WITH GFR
ALT: 13 U/L (ref 0–44)
AST: 20 U/L (ref 15–41)
Albumin: 3.7 g/dL (ref 3.5–5.0)
Alkaline Phosphatase: 86 U/L (ref 38–126)
Anion gap: 9 (ref 5–15)
BUN: 13 mg/dL (ref 8–23)
CO2: 25 mmol/L (ref 22–32)
Calcium: 8.9 mg/dL (ref 8.9–10.3)
Chloride: 106 mmol/L (ref 98–111)
Creatinine, Ser: 0.48 mg/dL (ref 0.44–1.00)
GFR, Estimated: >60 mL/min (ref >60)
Glucose, Bld: 96 mg/dL (ref 70–99)
Potassium: 3.4 mmol/L — ABNORMAL LOW (ref 3.5–5.1)
Sodium: 140 mmol/L (ref 135–145)
Total Bilirubin: 0.8 mg/dL (ref 0.0–1.2)
Total Protein: 6.8 g/dL (ref 6.5–8.1)

## 2024-05-12 LAB — CBC WITH DIFFERENTIAL/PLATELET
Abs Immature Granulocytes: 0.01 10*3/uL (ref 0.00–0.07)
Basophils Absolute: 0 10*3/uL (ref 0.0–0.1)
Basophils Relative: 1 %
Eosinophils Absolute: 0.4 10*3/uL (ref 0.0–0.5)
Eosinophils Relative: 6 %
HCT: 42 % (ref 36.0–46.0)
Hemoglobin: 14 g/dL (ref 12.0–15.0)
Immature Granulocytes: 0 %
Lymphocytes Relative: 36 %
Lymphs Abs: 2.5 10*3/uL (ref 0.7–4.0)
MCH: 28.2 pg (ref 26.0–34.0)
MCHC: 33.3 g/dL (ref 30.0–36.0)
MCV: 84.7 fL (ref 80.0–100.0)
Monocytes Absolute: 0.4 10*3/uL (ref 0.1–1.0)
Monocytes Relative: 6 %
Neutro Abs: 3.5 10*3/uL (ref 1.7–7.7)
Neutrophils Relative %: 51 %
Platelets: 225 10*3/uL (ref 150–400)
RBC: 4.96 MIL/uL (ref 3.87–5.11)
RDW: 14.2 % (ref 11.5–15.5)
WBC: 7 10*3/uL (ref 4.0–10.5)
nRBC: 0 % (ref 0.0–0.2)

## 2024-05-12 LAB — TSH: TSH: 1.61 u[IU]/mL (ref 0.350–4.500)

## 2024-05-12 MED ORDER — SODIUM CHLORIDE 0.9% FLUSH
10.0000 mL | INTRAVENOUS | Status: DC | PRN
Start: 2024-05-12 — End: 2024-05-12
  Administered 2024-05-12: 10 mL
  Filled 2024-05-12: qty 10

## 2024-05-12 MED ORDER — SODIUM CHLORIDE 0.9 % IV SOLN
Freq: Once | INTRAVENOUS | Status: AC
  Filled 2024-05-12: qty 250

## 2024-05-12 MED ORDER — HEPARIN SOD (PORK) LOCK FLUSH 100 UNIT/ML IV SOLN
500.0000 [IU] | Freq: Once | INTRAVENOUS | Status: AC | PRN
  Administered 2024-05-12: 500 [IU]
  Filled 2024-05-12: qty 5

## 2024-05-12 MED ORDER — SODIUM CHLORIDE 0.9 % IV SOLN
200.0000 mg | Freq: Once | INTRAVENOUS | Status: AC
  Administered 2024-05-12: 200 mg via INTRAVENOUS
  Filled 2024-05-12: qty 8

## 2024-05-12 NOTE — Assessment & Plan Note (Addendum)
#  JUNE 2023-STAGE IV--recurrent/metastatic disease with New left adrenal metastasis;  PET scan JUNE 20th- 2023-  Enlarged 4 cm hypermetabolic LEFT adrenal metastasis;  Suspicion of metastatic adenopathy to the LEFT hilum.  Part solid nodule in the RIGHT lower lobe without metabolic activity. 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 Currently on single agent Keytruda  [PD-L1 greater than 80%].   MARCH 3rd, 2025- CT CAP-  No evidence of recurrent or metastatic disease.  Ground-glass atypical pulmonary cysts in the right lower lobe- Stable.;   # DEC 17th, 2024- last Keytruda . Given MARCH 3rd, 2025- CT scans negative for any recurrent malignancy. Continue with keytruda  immunotherapy [until aug 2025 x2 years]- will order CT scan at next visit-   # Nausea/vomiting- ? GI illness- resolved s/p compazine . Likely unrelated to keytruda - monitor for now.  Stable.   # Mild Hypokalemia: discussed dietary supplement.stable  # Chronic pain: headaches/cervical pain/back pain [Dr.Naveira; pain doctor]  On NSAIDs [ monitor for now. OCT 2023- MRI brain- NEGATIVE for any metastatic disease.   Stable.  FEB 2024- MRI lumbar spine- degenerative disease- on ibuprofen prn- BID.  stable  # Left rib pain-Post throacotomy pain/tingling and numbness: stable [  DIS-Continue gabapentin  -300 mg TID; and then at extra at night prn].   stable  # CAD/ PVD- cramping- s/p evaluation [Dr.Schneir]; ? Carotid occlusion- s/p CEA surgery-  stable  # COPD-stable encouraged continue to avoid smoking; again counseled to quit smoking; s/p pulmonary stable  # vit D def [April 2025-PCP - 28- recommend vitD Supp BID- OTC; will check in 6 weeks- if not improved then ergo weekly-  # ACP: [03/31/2024] -Full code.  # IV Access :s/p  port placement stable  *AM appts- Monday- con-CT  PS # DISPOSITION:  #  keytruda  today # follow up in  3 weeks- -MD labs/port- cbc/cmp; possible Keytruda ;   # follow up in 6 weeks- -MD labs/port- cbc/cmp; vit D 25-OH; TSH; ;possible Keytruda ;Dr. Geraldene Kleine

## 2024-05-12 NOTE — Progress Notes (Signed)
 No concerns today

## 2024-05-12 NOTE — Progress Notes (Signed)
 [facility] CONSULT NOTE  Patient Care Team: Lamon Pillow, MD as PCP - General (Family Medicine) Schnier, Ninette Basque, MD (Vascular Surgery) Irby Mannan, MD (Inactive) as Consulting Physician (Gastroenterology) Drake Gens, RN as Oncology Nurse Navigator Gwyn Leos, MD as Consulting Physician (Oncology) Vergia Glasgow, MD as Consulting Physician (Pulmonary Disease) Percival Brace, MD as Consulting Physician (Cardiology) Trudi Fus, MD as Consulting Physician (Ophthalmology)   CHIEF COMPLAINTS/PURPOSE OF CONSULTATION: lung cancer  #  Oncology History Overview Note  #MAY 2022- LLL nodule 2.3 cm Adenocarcinoma Stage IA; s/p lobectomy.  No adjuvant therapy  # CT scan 4th June 2023-highly concerning for recurrent/metastatic disease with-. New left adrenal metastasis;  Ground-glass and part solid nodules in the peripheral right lower lobe, unchanged from 11/07/2021 but slightly enlarged from 01/01/2018. Findings are suspicious for indolent adenocarcinoma.  F One- PFL1 =80%; KRAS G12C PET scan JUNE 20th- 2023-  Enlarged 4 cm hypermetabolic LEFT adrenal metastasis;  Suspicion of metastatic adenopathy to the LEFT hilum.  Part solid nodule in the RIGHT lower lobe without metabolic activity.   3 ADRENAL BIOPSY- CK7 POSITIVE ADENOCARCINOMA- There is limited tissue remaining for ancillary testing, not likely  sufficient for NGS testing.   # AUG 7th, 2023-single agent Keytruda .    Right mildly complex cystic adnexal mass noted-June 13th, 2024- Significant interval enlargement of a multiseptated cystic lesion of the right ovary, now measuring 11.7 x 8.2 cm, previously 8.1 x 5.8 cm.- s/p in [facility] [October, 2024]- mucinous cystadenoma - stable.   Primary cancer of left lower lobe of lung (HCC)  05/09/2021 Initial Diagnosis   Primary cancer of left lower lobe of lung (HCC)   07/04/2022 -  Chemotherapy   Patient is on Treatment Plan : LUNG NSCLC  Pembrolizumab  (200) q21d     07/10/2022 - 07/10/2022 Chemotherapy   Patient is on Treatment Plan : LUNG NSCLC Pembrolizumab  (200) q21d      HISTORY OF PRESENTING ILLNESS: Patient  ambulating.  Alone.   Laura Nielsen 68 y.o.  female history of smoking; prior history of lung cancer-with likely recurrence to left adrenal gland [s/p Bx CK-7 positive TTF-1 negative]-clinically suggestive of recurrent lung cancer on single agent Keytruda  is here for follow-up.  Patient here for oncology follow-up appointment,concerns of consistent headache. But this is chronic. Not any worse.   Patient had one more episode  of  nauseated  with one episode of diarrhea. She took compazine  and then resolved.    Denies any abdominal pain. Otherwise patient continues to have mild shortness of breath on exertion. Complains of cramping in the legs chronic.   Review of Systems  Constitutional:  Negative for chills, diaphoresis, fever, malaise/fatigue and weight loss.  HENT:  Negative for nosebleeds and sore throat.   Eyes:  Negative for double vision.  Respiratory:  Negative for cough, hemoptysis, sputum production, shortness of breath and wheezing.   Cardiovascular:  Negative for palpitations, orthopnea and leg swelling.  Gastrointestinal:  Negative for abdominal pain, blood in stool, constipation, diarrhea, heartburn, melena, nausea and vomiting.  Genitourinary:  Negative for dysuria, frequency and urgency.  Musculoskeletal:  Positive for back pain and joint pain.  Skin: Negative.  Negative for itching and rash.  Neurological:  Positive for tingling. Negative for dizziness, focal weakness, weakness and headaches.  Endo/Heme/Allergies:  Does not bruise/bleed easily.  Psychiatric/Behavioral:  Negative for depression. The patient is not nervous/anxious and does not have insomnia.      MEDICAL HISTORY:  Past  Medical History:  Diagnosis Date   AAA (abdominal aortic aneurysm) (HCC)    a.) s/p EVAR stent graft  repair 03/27/2013   Adnexal mass    Aortic atherosclerosis (HCC)    Arthritis    Asthma    Atherosclerosis of native artery of both lower extremities with intermittent claudication (HCC)    B12 deficiency    Bilateral carotid artery stenosis    a.) doppler 05/06/2020 and 06/30/2021: 1-39% BICA; b.) doppler 11/20/2022: 60-79% RICA; c.) CTA neck 08/17/2023: >75% RICA, 30% LICA   CAD (coronary artery disease) 03/16/2009   a.) LHC/PCI 03/16/2009: 25% mLCx, 70% mRCA (3 x 18 mm Xience V DES); b.) MV 10/22/2017 and 03/16/2021: no ischemia; c.) MV 07/16/2023: mild apical ischemia   Cancer of left adrenal gland (HCC) 06/20/2022   a.) CNB 06/20/2022 -> pathology (+) for CK7 (TTF-1 -) adenocarcinoma   Centrilobular emphysema (HCC)    Cervical spinal stenosis    Cervical spondylosis with radiculopathy    Chronic back pain    Colon polyp    Complex ovarian cyst    Complication of anesthesia    a.) prone to experience postoperative hypotension   COPD (chronic obstructive pulmonary disease) (HCC)    DDD (degenerative disc disease), cervical    DDD (degenerative disc disease), thoracolumbar    Depression    Diabetes mellitus type 2, controlled (HCC)    Diastolic dysfunction 06/29/2023   a.) TTE 06/29/2023: EF >55%, no RWMAs, G1DD, triv PR, mild MR/TR   Fatty liver    GERD (gastroesophageal reflux disease)    Gout    History of bilateral cataract extraction    History of kidney stones    Hypercholesteremia    Long term (current) use of aspirin     Long term current use of clopidogrel     Lumbar spinal stenosis    Lumbar spondylosis    Non-small cell lung cancer metastatic to adrenal gland (HCC)    a.) s/p LLL wedge resection 04/06/2021 --> pathology (+) for well differentiated adenocarcinoma (stage 1A) --> no adjuvant Tx; b.) recurrent stage IV adenocarcinoma 05/2022 --> Tx'd with  pembrolizumab    Osteoarthritis    Osteoporosis    Primary hypertension    Seasonal allergies    Sleep apnea     a.) no nocturnal PAP therapy since 2021   Vitamin D  deficiency     SURGICAL HISTORY: Past Surgical History:  Procedure Laterality Date   ABDOMINAL AORTIC ENDOVASCULAR STENT GRAFT  03/27/2013   Dr. Devon Fogo   ABDOMINAL HYSTERECTOMY     Menometrorrhagia. Excessive bleeding. Unknown if cervix removed.    BILATERAL SALPINGOOPHORECTOMY  09/27/2023   CATARACT EXTRACTION W/PHACO Left 05/14/2023   Procedure: CATARACT EXTRACTION PHACO AND INTRAOCULAR LENS PLACEMENT (IOC) LEFT DIABETIC  OMIDRIA   19.40  01:34.8;  Surgeon: Trudi Fus, MD;  Location: [facility];  Service: Ophthalmology;  Laterality: Left;   CATARACT EXTRACTION W/PHACO Right 06/27/2023   Procedure: CATARACT EXTRACTION PHACO AND INTRAOCULAR LENS PLACEMENT (IOC) RIGHT DIABETIC  6.47  00:42.6;  Surgeon: Trudi Fus, MD;  Location: [facility];  Service: Ophthalmology;  Laterality: Right;   COLONOSCOPY WITH PROPOFOL  N/A 01/05/2021   Procedure: COLONOSCOPY WITH PROPOFOL ;  Surgeon: Irby Mannan, MD;  Location: [facility] END[facility]COPY;  Service: Endoscopy;  Laterality: N/A;   CORONARY ANGIOPLASTY WITH STENT PLACEMENT  03/16/2009   Procedure: CORONARY ANGIOPLASTY WITH STENT PLACEMENT; Location: [facility]; Surgeons: Starlette Ebbs, MD (diagnostic) and Antionette Kirks, MD (interventional)   ENDARTERECTOMY Right 11/09/2023  Procedure: ENDARTERECTOMY CAROTID;  Surgeon: Jackquelyn Mass, MD;  Location: [facility] ORS;  Service: Vascular;  Laterality: Right;   ESOPHAGOGASTRODUODENOSCOPY (EGD) WITH PROPOFOL  N/A 01/05/2021   Procedure: ESOPHAGOGASTRODUODENOSCOPY (EGD) WITH PROPOFOL ;  Surgeon: Irby Mannan, MD;  Location: [facility] END[facility]COPY;  Service: Endoscopy;  Laterality: N/A;   EXTRACORPOREAL SHOCK WAVE LITHOTRIPSY Right 1999   INTERCOSTAL NERVE BLOCK Left 04/06/2021   Procedure: INTERCOSTAL NERVE BLOCK;  Surgeon: Zelphia Higashi, MD;  Location[facility] OR;  Service: Thoracic;  Laterality: Left;   IR IMAGING  GUIDED PORT INSERTION  06/20/2022   LUNG LOBECTOMY Left 04/06/2021   robotic left lower lobectomy 04/06/2021 Dr. Luna Salinas for Stage 1A adenocarcinoma   NODE DISSECTION Left 04/06/2021   Procedure: NODE DISSECTION;  Surgeon: Zelphia Higashi, MD;  Location: [facility] OR;  Service: Thoracic;  Laterality: Left;   TUBAL LIGATION      SOCIAL HISTORY: Social History   Socioeconomic History   Marital status: Divorced    Spouse name: Not on file   Number of children: 2   Years of education: H/S   Highest education level: 12th grade  Occupational History   Occupation: Disabled   Occupation: retired  Tobacco Use   Smoking status: Every Day    Current packs/day: 1.00    Average packs/day: 1 pack/day for 51.4 years (51.4 ttl pk-yrs)    Types: Cigarettes    Start date: 1974   Smokeless tobacco: Never   Tobacco comments:    12/23/19 states she quit for 9 months and then started back.     02/20/24 1/2 ppd  Vaping Use   Vaping status: Never Used  Substance and Sexual Activity   Alcohol  use: Not Currently   Drug use: No   Sexual activity: Not on file  Other Topics Concern   Not on file  Social History Narrative   Hx of smoking; quit prior to lung surgery then started back. Lives in [place] alone. Used to work in [employer], [employer]- Facilities manager. On disability sec to spinal pain.    Social Drivers of Health   Financial Resource Strain: Medium Risk (02/20/2024)   Overall Financial Resource Strain (CARDIA)    Difficulty of Paying Living Expenses: Somewhat hard  Food Insecurity: No Food Insecurity (02/20/2024)   Hunger Vital Sign    Worried About Running Out of Food in the Last Year: Never true    Ran Out of Food in the Last Year: Never true  Transportation Needs: No Transportation Needs (02/20/2024)   PRAPARE - Administrator, Civil Service (Medical): No    Lack of Transportation (Non-Medical): No  Physical Activity: Inactive (02/20/2024)   Exercise Vital Sign    Days of Exercise per  Week: 0 days    Minutes of Exercise per Session: 0 min  Stress: Stress Concern Present (02/20/2024)   [employer] of Occupational Health - Occupational Stress Questionnaire    Feeling of Stress : To some extent  Social Connections: Socially Isolated (02/20/2024)   Social Connection and Isolation Panel [NHANES]    Frequency of Communication with Friends and Family: More than three times a week    Frequency of Social Gatherings with Friends and Family: Once a week    Attends Religious Services: Never    Database administrator or Organizations: No    Attends Banker Meetings: Never    Marital Status: Divorced  Catering manager Violence: Not At Risk (02/20/2024)   Humiliation, Afraid, Rape, and Kick questionnaire  Fear of Current or Ex-Partner: No    Emotionally Abused: No    Physically Abused: No    Sexually Abused: No    FAMILY HISTORY: Family History  Problem Relation Age of Onset   Hypertension Mother    Coronary artery disease Mother    Heart attack Mother        acute   Cancer Mother    Alcohol  abuse Father    Depression Father    Hypertension Father    Heart attack Father 63       acute   Alcohol  abuse Sister    Hyperlipidemia Sister    Hypertension Sister    Cancer Sister 75   Heart attack Sister        x's 2   Coronary artery disease Sister 56       x's 2   Breast cancer Sister 63    ALLERGIES:  is allergic to atorvastatin, omeprazole, and bupropion .  MEDICATIONS:  Current Outpatient Medications  Medication Sig Dispense Refill   acetaminophen  (TYLENOL ) 500 MG tablet Take 500 mg by mouth every 6 (six) hours as needed for moderate pain (pain score 4-6).     albuterol  (VENTOLIN  HFA) 108 (90 Base) MCG/ACT inhaler Inhale 2 puffs into the lungs every 6 (six) hours as needed for wheezing or shortness of breath. 8.5 g 2   allopurinol  (ZYLOPRIM ) 100 MG tablet Take 1 tablet (100 mg total) by mouth daily. 90 tablet 4   aspirin  81 MG tablet Take 81 mg  by mouth daily.      bisoprolol  (ZEBETA ) 10 MG tablet Take 1 tablet (10 mg total) by mouth daily. 90 tablet 0   Budeson-Glycopyrrol-Formoterol  (BREZTRI  AEROSPHERE) 160-9-4.8 MCG/ACT AERO Inhale 2 puffs into the lungs in the morning and at bedtime. 1284 g 11   clopidogrel  (PLAVIX ) 75 MG tablet Take 1 tablet (75 mg total) by mouth daily. 90 tablet 0   ibuprofen (ADVIL) 200 MG tablet Take 200 mg by mouth 2 (two) times daily as needed (headaches).     lactulose  (CHRONULAC ) 10 GM/15ML solution Take 30 mLs (20 g total) by mouth 2 (two) times daily as needed for mild constipation. 236 mL 0   lidocaine -prilocaine  (EMLA ) cream Apply on the port. 30 -45 min  prior to port access. 30 g 3   lisinopril -hydrochlorothiazide  (ZESTORETIC ) 20-12.5 MG tablet Take 1 tablet by mouth daily. 90 tablet 2   magnesium gluconate (MAGONATE) 500 MG tablet Take 500 mg by mouth at bedtime.     pantoprazole  (PROTONIX ) 20 MG tablet Take 1 tablet (20 mg total) by mouth daily. 90 tablet 4   Polyethyl Glyc-Propyl Glyc PF (SYSTANE PRESERVATIVE FREE) 0.4-0.3 % SOLN Place 1 drop into both eyes daily as needed (dry eyes).     prochlorperazine  (COMPAZINE ) 10 MG tablet Take 1 tablet (10 mg total) by mouth every 6 (six) hours as needed for nausea or vomiting. 45 tablet 1   rosuvastatin  (CRESTOR ) 20 MG tablet Take 1 tablet (20 mg total) by mouth daily. 90 tablet 4   Semaglutide , 1 MG/DOSE, (OZEMPIC , 1 MG/DOSE,) 4 MG/3ML SOPN Inject 1 mg into the skin once a week. 9 mL 1   oxyCODONE  (OXY IR/ROXICODONE ) 5 MG immediate release tablet Take 1-2 tablets (5-10 mg total) by mouth every 4 (four) hours as needed for moderate pain (pain score 4-6). (Patient not taking: Reported on 01/21/2024) 30 tablet 0   No current facility-administered medications for this visit.   Facility-Administered Medications Ordered in Other Visits  Medication Dose Route Frequency Provider Last Rate Last Admin   heparin  lock flush 100 UNIT/ML injection            heparin   lock flush 100 unit/mL  500 Units Intracatheter Once PRN Shanyah Gattuso R, MD       pembrolizumab  (KEYTRUDA ) 200 mg in sodium chloride  0.9 % 50 mL chemo infusion  200 mg Intravenous Once Shaune Malacara R, MD       sodium chloride  flush (NS) 0.9 % injection 10 mL  10 mL Intracatheter PRN Naomi Castrogiovanni R, MD        Mild maculopapular rash on the Maller area left more than right.  PHYSICAL EXAMINATION: ECOG PERFORMANCE STATUS: 1 - Symptomatic but completely ambulatory  Vitals:   05/12/24 0955  BP: 102/72  Pulse: 70  Resp: 16  Temp: (!) 96.6 F (35.9 C)  SpO2: 100%        Filed Weights   05/12/24 0955  Weight: 180 lb 4.8 oz (81.8 kg)         Physical Exam HENT:     Head: Normocephalic and atraumatic.     Mouth/Throat:     Pharynx: No oropharyngeal exudate.  Eyes:     Pupils: Pupils are equal, round, and reactive to light.  Cardiovascular:     Rate and Rhythm: Normal rate and regular rhythm.  Pulmonary:     Comments: Decreased breath sounds bilaterally.  No wheeze or crackles Abdominal:     General: Bowel sounds are normal. There is no distension.     Palpations: Abdomen is soft. There is no mass.     Tenderness: There is no abdominal tenderness. There is no guarding or rebound.  Musculoskeletal:        General: No tenderness. Normal range of motion.     Cervical back: Normal range of motion and neck supple.  Skin:    General: Skin is warm.  Neurological:     Mental Status: She is alert and oriented to person, place, and time.  Psychiatric:        Mood and Affect: Affect normal.      LABORATORY DATA:  I have reviewed the data as listed Lab Results  Component Value Date   WBC 7.0 05/12/2024   HGB 14.0 05/12/2024   HCT 42.0 05/12/2024   MCV 84.7 05/12/2024   PLT 225 05/12/2024   Recent Labs    03/31/24 0924 04/21/24 0820 05/12/24 0947  NA 140 141 140  K 3.8 3.5 3.4*  CL 109 108 106  CO2 23 25 25   GLUCOSE 95 95 96  BUN 17 18 13    CREATININE 0.60 0.64 0.48  CALCIUM  9.0 9.1 8.9  GFRNONAA >60 >60 >60  PROT 6.4* 6.5 6.8  ALBUMIN 3.6 3.6 3.7  AST 19 17 20   ALT 14 14 13   ALKPHOS 80 87 86  BILITOT 0.5 0.8 0.8    RADIOGRAPHIC STUDIES: I have personally reviewed the radiological images as listed and agreed with the findings in the report. No results found.     ASSESSMENT & PLAN:   Primary cancer of left lower lobe of lung (HCC) #JUNE 2023-STAGE IV--recurrent/metastatic disease with New left adrenal metastasis;  PET scan JUNE 20th- 2023-  Enlarged 4 cm hypermetabolic LEFT adrenal metastasis;  Suspicion of metastatic adenopathy to the LEFT hilum.  Part solid nodule in the RIGHT lower lobe without metabolic activity. Foundation One- PLD1 =80% [primary LUNG mass];  JULY 2023- S/p Biopsy of adrenal nodule- Biopsied POSITIVE  for CK7 positive adenocarcinoma [QNS for NGS]. Currently on single agent Keytruda  [PD-L1 greater than 80%].   MARCH 3rd, 2025- CT CAP-  No evidence of recurrent or metastatic disease.  Ground-glass atypical pulmonary cysts in the right lower lobe- Stable.;   # DEC 17th, 2024- last Keytruda . Given MARCH 3rd, 2025- CT scans negative for any recurrent malignancy. Continue with keytruda  immunotherapy [until aug 2025 x2 years]- will order CT scan at next visit-   # Nausea/vomiting- ? GI illness- resolved s/p compazine . Likely unrelated to keytruda - monitor for now.  Stable.   # Mild Hypokalemia: discussed dietary supplement.stable  # Chronic pain: headaches/cervical pain/back pain [Dr.Naveira; pain doctor]  On NSAIDs [ monitor for now. OCT 2023- MRI brain- NEGATIVE for any metastatic disease.   Stable.  FEB 2024- MRI lumbar spine- degenerative disease- on ibuprofen prn- BID.  stable  # Left rib pain-Post throacotomy pain/tingling and numbness: stable [  DIS-Continue gabapentin  -300 mg TID; and then at extra at night prn].   stable  # CAD/ PVD- cramping- s/p evaluation [Dr.Schneir]; ? Carotid occlusion-  s/p CEA surgery-  stable  # COPD-stable encouraged continue to avoid smoking; again counseled to quit smoking; s/p pulmonary stable  # vit D def [April 2025-PCP - 28- recommend vitD Supp BID- OTC; will check in 6 weeks- if not improved then ergo weekly-  # ACP: [03/31/2024] -Full code.  # IV Access :s/p  port placement stable  *AM appts- Monday- con-CT  PS # DISPOSITION:  #  keytruda  today # follow up in 3 weeks- -MD labs/port- cbc/cmp; possible Keytruda ;   # follow up in 6 weeks- -MD labs/port- cbc/cmp; vit D 25-OH; TSH; ;possible Keytruda ;Dr. Alphonzo Ask, MD 05/12/2024 11:49 AM

## 2024-05-12 NOTE — Patient Instructions (Signed)

## 2024-05-12 NOTE — Patient Instructions (Signed)
 [facility] - A DEPT OF [facility]  Discharge Instructions: Thank you for choosing [facility] to provide your oncology and hematology care.  If you have a lab appointment with the Cancer Center, please go directly to the Cancer Center and check in at the registration area.  Wear comfortable clothing and clothing appropriate for easy access to any Portacath or PICC line.   We strive to give you quality time with your provider. You may need to reschedule your appointment if you arrive late (15 or more minutes).  Arriving late affects you and other patients whose appointments are after yours.  Also, if you miss three or more appointments without notifying the office, you may be dismissed from the clinic at the provider's discretion.      For prescription refill requests, have your pharmacy contact our office and allow 72 hours for refills to be completed.    Today you received the following chemotherapy and/or immunotherapy agents Rande Lawman      To help prevent nausea and vomiting after your treatment, we encourage you to take your nausea medication as directed.  BELOW ARE SYMPTOMS THAT SHOULD BE REPORTED IMMEDIATELY: *FEVER GREATER THAN 100.4 F (38 C) OR HIGHER *CHILLS OR SWEATING *NAUSEA AND VOMITING THAT IS NOT CONTROLLED WITH YOUR NAUSEA MEDICATION *UNUSUAL SHORTNESS OF BREATH *UNUSUAL BRUISING OR BLEEDING *URINARY PROBLEMS (pain or burning when urinating, or frequent urination) *BOWEL PROBLEMS (unusual diarrhea, constipation, pain near the anus) TENDERNESS IN MOUTH AND THROAT WITH OR WITHOUT PRESENCE OF ULCERS (sore throat, sores in mouth, or a toothache) UNUSUAL RASH, SWELLING OR PAIN  UNUSUAL VAGINAL DISCHARGE OR ITCHING   Items with * indicate a potential emergency and should be followed up as soon as possible or go to the Emergency Department if any problems should occur.  Please show the CHEMOTHERAPY ALERT CARD or IMMUNOTHERAPY  ALERT CARD at check-in to the Emergency Department and triage nurse.  Should you have questions after your visit or need to cancel or reschedule your appointment, please contact [facility] - A DEPT OF [facility]  [phone] and follow the prompts.  Office hours are 8:00 a.m. to 4:30 p.m. Monday - Friday. Please note that voicemails left after 4:00 p.m. may not be returned until the following business day.  We are closed weekends and major holidays. You have access to a nurse at all times for urgent questions. Please call the main number to the clinic [phone] and follow the prompts.  For any non-urgent questions, you may also contact your provider using MyChart. We now offer e-Visits for anyone 48 and older to request care online for non-urgent symptoms. For details visit [email or web address].   Also download the MyChart app! Go to the app store, search "MyChart", open the app, select [facility], and log in with your MyChart username and password.

## 2024-05-29 ENCOUNTER — Telehealth: Payer: Self-pay

## 2024-05-29 ENCOUNTER — Telehealth: Payer: Self-pay | Admitting: Family Medicine

## 2024-05-29 DIAGNOSIS — E119 Type 2 diabetes mellitus without complications: Secondary | ICD-10-CM

## 2024-05-29 DIAGNOSIS — I6523 Occlusion and stenosis of bilateral carotid arteries: Secondary | ICD-10-CM

## 2024-05-29 MED ORDER — OZEMPIC (0.25 OR 0.5 MG/DOSE) 2 MG/1.5ML ~~LOC~~ SOPN: 0.5000 mg | PEN_INJECTOR | SUBCUTANEOUS | 1 refills | Status: DC

## 2024-05-29 NOTE — Telephone Encounter (Signed)
 Copied from CRM [id number]. Topic: Clinical - Medication Refill >> May 29, 2024  8:38 AM Berwyn MATSU wrote: Medication: clopidogrel  (PLAVIX ) 75 MG tablet  Has the patient contacted their pharmacy? Yes (Agent: If no, request that the patient contact the pharmacy for the refill. If patient does not wish to contact the pharmacy document the reason why and proceed with request.) (Agent: If yes, when and what did the pharmacy advise?)  This is the patient's preferred pharmacy:  [facility] - [place], [place] - [address] [place] [address] Phone: [phone] Fax: [phone]  Is this the correct pharmacy for this prescription? Yes If no, delete pharmacy and type the correct one.   Has the prescription been filled recently? Yes  Is the patient out of the medication? Yes  Has the patient been seen for an appointment in the last year OR does the patient have an upcoming appointment? Yes  Can we respond through MyChart? Yes  Agent: Please be advised that Rx refills may take up to 3 business days. We ask that you follow-up with your pharmacy.

## 2024-05-29 NOTE — Telephone Encounter (Signed)
 Copied from CRM [id number]. Topic: Clinical - Medication Question >> May 29, 2024  8:34 AM Berwyn MATSU wrote: Reason for CRM:  Patient called in requesting to know if she can go back down to 0.5mg  of the Semaglutide , 1 MG/DOSE, (OZEMPIC , 1 MG/DOSE,) 4 MG/3ML SOPN  due to patient states she has nausea and vomiting that comes and goes sometimes it can be every other week or sometimes not at all.   May you please assist.

## 2024-05-30 MED ORDER — CLOPIDOGREL BISULFATE 75 MG PO TABS: 75.0000 mg | ORAL_TABLET | Freq: Every day | ORAL | 1 refills | Status: DC

## 2024-05-30 NOTE — Telephone Encounter (Signed)
 Requested Prescriptions  Pending Prescriptions Disp Refills   clopidogrel  (PLAVIX ) 75 MG tablet 90 tablet 1    Sig: Take 1 tablet (75 mg total) by mouth daily.     Hematology: Antiplatelets - clopidogrel  Passed - 05/30/2024 11:49 AM      Passed - HCT in normal range and within 180 days    HCT  Date Value Ref Range Status  05/12/2024 42.0 36.0 - 46.0 % Final   Hematocrit  Date Value Ref Range Status  03/17/2024 47.7 (H) 34.0 - 46.6 % Final         Passed - HGB in normal range and within 180 days    Hemoglobin  Date Value Ref Range Status  05/12/2024 14.0 12.0 - 15.0 g/dL Final  95/85/7974 83.9 (H) 11.1 - 15.9 g/dL Final         Passed - PLT in normal range and within 180 days    Platelets  Date Value Ref Range Status  05/12/2024 225 150 - 400 K/uL Final  03/17/2024 239 150 - 450 x10E3/uL Final         Passed - Cr in normal range and within 360 days    Creatinine  Date Value Ref Range Status  01/21/2024 0.70 0.44 - 1.00 mg/dL Final   Creat  Date Value Ref Range Status  08/30/2017 0.60 0.50 - 0.99 mg/dL Final    Comment:    For patients >31 years of age, the reference limit for Creatinine is approximately 13% higher for people identified as African-American. .    Creatinine, Ser  Date Value Ref Range Status  05/12/2024 0.48 0.44 - 1.00 mg/dL Final   Creatinine, POC  Date Value Ref Range Status  12/12/2017 n/a mg/dL Final         Passed - Valid encounter within last 6 months    Recent Outpatient Visits           2 months ago Constipation, unspecified constipation type   [name], MD

## 2024-06-02 ENCOUNTER — Encounter: Payer: Self-pay | Admitting: Internal Medicine

## 2024-06-02 ENCOUNTER — Inpatient Hospital Stay

## 2024-06-02 ENCOUNTER — Inpatient Hospital Stay (HOSPITAL_BASED_OUTPATIENT_CLINIC_OR_DEPARTMENT_OTHER): Admitting: Internal Medicine

## 2024-06-02 VITALS — BP 98/61 | HR 65 | Resp 16

## 2024-06-02 VITALS — BP 110/81 | HR 71 | Temp 97.0°F | Resp 16 | Ht 68.0 in | Wt 179.1 lb

## 2024-06-02 DIAGNOSIS — C3432 Malignant neoplasm of lower lobe, left bronchus or lung: Secondary | ICD-10-CM | POA: Diagnosis not present

## 2024-06-02 DIAGNOSIS — C7972 Secondary malignant neoplasm of left adrenal gland: Secondary | ICD-10-CM | POA: Diagnosis not present

## 2024-06-02 DIAGNOSIS — Z79620 Long term (current) use of immunosuppressive biologic: Secondary | ICD-10-CM | POA: Diagnosis not present

## 2024-06-02 DIAGNOSIS — Z5112 Encounter for antineoplastic immunotherapy: Secondary | ICD-10-CM | POA: Diagnosis not present

## 2024-06-02 DIAGNOSIS — F17210 Nicotine dependence, cigarettes, uncomplicated: Secondary | ICD-10-CM | POA: Diagnosis not present

## 2024-06-02 LAB — CBC WITH DIFFERENTIAL/PLATELET
Abs Immature Granulocytes: 0.02 10*3/uL (ref 0.00–0.07)
Basophils Absolute: 0.1 10*3/uL (ref 0.0–0.1)
Basophils Relative: 1 %
Eosinophils Absolute: 0.4 10*3/uL (ref 0.0–0.5)
Eosinophils Relative: 4 %
HCT: 43.3 % (ref 36.0–46.0)
Hemoglobin: 14.5 g/dL (ref 12.0–15.0)
Immature Granulocytes: 0 %
Lymphocytes Relative: 33 %
Lymphs Abs: 3.1 10*3/uL (ref 0.7–4.0)
MCH: 28.5 pg (ref 26.0–34.0)
MCHC: 33.5 g/dL (ref 30.0–36.0)
MCV: 85.1 fL (ref 80.0–100.0)
Monocytes Absolute: 0.5 10*3/uL (ref 0.1–1.0)
Monocytes Relative: 6 %
Neutro Abs: 5.4 10*3/uL (ref 1.7–7.7)
Neutrophils Relative %: 56 %
Platelets: 193 10*3/uL (ref 150–400)
RBC: 5.09 MIL/uL (ref 3.87–5.11)
RDW: 14 % (ref 11.5–15.5)
WBC: 9.6 10*3/uL (ref 4.0–10.5)
nRBC: 0 % (ref 0.0–0.2)

## 2024-06-02 LAB — COMPREHENSIVE METABOLIC PANEL WITH GFR
ALT: 14 U/L (ref 0–44)
AST: 22 U/L (ref 15–41)
Albumin: 3.9 g/dL (ref 3.5–5.0)
Alkaline Phosphatase: 95 U/L (ref 38–126)
Anion gap: 9 (ref 5–15)
BUN: 14 mg/dL (ref 8–23)
CO2: 24 mmol/L (ref 22–32)
Calcium: 9.3 mg/dL (ref 8.9–10.3)
Chloride: 106 mmol/L (ref 98–111)
Creatinine, Ser: 0.59 mg/dL (ref 0.44–1.00)
GFR, Estimated: >60 mL/min (ref >60)
Glucose, Bld: 94 mg/dL (ref 70–99)
Potassium: 3.5 mmol/L (ref 3.5–5.1)
Sodium: 139 mmol/L (ref 135–145)
Total Bilirubin: 0.9 mg/dL (ref 0.0–1.2)
Total Protein: 6.7 g/dL (ref 6.5–8.1)

## 2024-06-02 MED ORDER — HEPARIN SOD (PORK) LOCK FLUSH 100 UNIT/ML IV SOLN
500.0000 [IU] | Freq: Once | INTRAVENOUS | Status: AC | PRN
  Administered 2024-06-02: 500 [IU]
  Filled 2024-06-02: qty 5

## 2024-06-02 MED ORDER — SODIUM CHLORIDE 0.9 % IV SOLN
Freq: Once | INTRAVENOUS | Status: AC
  Filled 2024-06-02: qty 250

## 2024-06-02 MED ORDER — SODIUM CHLORIDE 0.9 % IV SOLN
200.0000 mg | Freq: Once | INTRAVENOUS | Status: AC
  Administered 2024-06-02: 200 mg via INTRAVENOUS
  Filled 2024-06-02: qty 8

## 2024-06-02 NOTE — Progress Notes (Signed)
 [facility] CONSULT NOTE  Patient Care Team: [name], MD as PCP - General (Family Medicine) Schnier, Cordella MATSU, MD (Vascular Surgery) Janalyn Keene NOVAK, MD (Inactive) as Consulting Physician (Gastroenterology) Verdene Gills, RN as Oncology Nurse Navigator Rennie Cindy SAUNDERS, MD as Consulting Physician (Oncology) Isadora Hose, MD as Consulting Physician (Pulmonary Disease) Ammon Blunt, MD as Consulting Physician (Cardiology) Enola Feliciano Hugger, MD as Consulting Physician (Ophthalmology)   CHIEF COMPLAINTS/PURPOSE OF CONSULTATION: lung cancer  #  Oncology History Overview Note  #MAY 2022- LLL nodule 2.3 cm Adenocarcinoma Stage IA; s/p lobectomy.  No adjuvant therapy  # CT scan 4th June 2023-highly concerning for recurrent/metastatic disease with-. New left adrenal metastasis;  Ground-glass and part solid nodules in the peripheral right lower lobe, unchanged from 11/07/2021 but slightly enlarged from 01/01/2018. Findings are suspicious for indolent adenocarcinoma.  F One- PFL1 =80%; KRAS G12C PET scan JUNE 20th- 2023-  Enlarged 4 cm hypermetabolic LEFT adrenal metastasis;  Suspicion of metastatic adenopathy to the LEFT hilum.  Part solid nodule in the RIGHT lower lobe without metabolic activity.   3 ADRENAL BIOPSY- CK7 POSITIVE ADENOCARCINOMA- There is limited tissue remaining for ancillary testing, not likely  sufficient for NGS testing.   # AUG 7th, 2023-single agent Keytruda .    Right mildly complex cystic adnexal mass noted-June 13th, 2024- Significant interval enlargement of a multiseptated cystic lesion of the right ovary, now measuring 11.7 x 8.2 cm, previously 8.1 x 5.8 cm.- s/p in [facility] [October, 2024]- mucinous cystadenoma - stable.   Primary cancer of left lower lobe of lung (HCC)  05/09/2021 Initial Diagnosis   Primary cancer of left lower lobe of lung (HCC)   07/04/2022 -  Chemotherapy   Patient is on Treatment Plan : LUNG NSCLC  Pembrolizumab  (200) q21d     07/10/2022 - 07/10/2022 Chemotherapy   Patient is on Treatment Plan : LUNG NSCLC Pembrolizumab  (200) q21d      HISTORY OF PRESENTING ILLNESS: Patient  ambulating.  Alone.   Laura Nielsen 68 y.o.  female history of smoking; prior history of lung cancer-with likely recurrence to left adrenal gland [s/p Bx CK-7 positive TTF-1 negative]-clinically suggestive of recurrent lung cancer on single agent Keytruda  is here for follow-up.  Appetite 50% normal, nothing tastes good. Patient having some nausea and vomiting coming and going. ? Related to Ozempic .   Patient has chronic concerns of consistent headache.  Not any worse.    Denies any abdominal pain. Otherwise patient continues to have mild shortness of breath on exertion. Complains of cramping in the legs chronic.   Review of Systems  Constitutional:  Negative for chills, diaphoresis, fever, malaise/fatigue and weight loss.  HENT:  Negative for nosebleeds and sore throat.   Eyes:  Negative for double vision.  Respiratory:  Negative for cough, hemoptysis, sputum production, shortness of breath and wheezing.   Cardiovascular:  Negative for palpitations, orthopnea and leg swelling.  Gastrointestinal:  Negative for abdominal pain, blood in stool, constipation, diarrhea, heartburn, melena, nausea and vomiting.  Genitourinary:  Negative for dysuria, frequency and urgency.  Musculoskeletal:  Positive for back pain and joint pain.  Skin: Negative.  Negative for itching and rash.  Neurological:  Positive for tingling. Negative for dizziness, focal weakness, weakness and headaches.  Endo/Heme/Allergies:  Does not bruise/bleed easily.  Psychiatric/Behavioral:  Negative for depression. The patient is not nervous/anxious and does not have insomnia.      MEDICAL HISTORY:  Past Medical History:  Diagnosis Date  AAA (abdominal aortic aneurysm) (HCC)    a.) s/p EVAR stent graft repair 03/27/2013   Adnexal mass     Aortic atherosclerosis (HCC)    Arthritis    Asthma    Atherosclerosis of native artery of both lower extremities with intermittent claudication (HCC)    B12 deficiency    Bilateral carotid artery stenosis    a.) doppler 05/06/2020 and 06/30/2021: 1-39% BICA; b.) doppler 11/20/2022: 60-79% RICA; c.) CTA neck 08/17/2023: >75% RICA, 30% LICA   CAD (coronary artery disease) 03/16/2009   a.) LHC/PCI 03/16/2009: 25% mLCx, 70% mRCA (3 x 18 mm Xience V DES); b.) MV 10/22/2017 and 03/16/2021: no ischemia; c.) MV 07/16/2023: mild apical ischemia   Cancer of left adrenal gland (HCC) 06/20/2022   a.) CNB 06/20/2022 -> pathology (+) for CK7 (TTF-1 -) adenocarcinoma   Centrilobular emphysema (HCC)    Cervical spinal stenosis    Cervical spondylosis with radiculopathy    Chronic back pain    Colon polyp    Complex ovarian cyst    Complication of anesthesia    a.) prone to experience postoperative hypotension   COPD (chronic obstructive pulmonary disease) (HCC)    DDD (degenerative disc disease), cervical    DDD (degenerative disc disease), thoracolumbar    Depression    Diabetes mellitus type 2, controlled (HCC)    Diastolic dysfunction 06/29/2023   a.) TTE 06/29/2023: EF >55%, no RWMAs, G1DD, triv PR, mild MR/TR   Fatty liver    GERD (gastroesophageal reflux disease)    Gout    History of bilateral cataract extraction    History of kidney stones    Hypercholesteremia    Long term (current) use of aspirin     Long term current use of clopidogrel     Lumbar spinal stenosis    Lumbar spondylosis    Non-small cell lung cancer metastatic to adrenal gland (HCC)    a.) s/p LLL wedge resection 04/06/2021 --> pathology (+) for well differentiated adenocarcinoma (stage 1A) --> no adjuvant Tx; b.) recurrent stage IV adenocarcinoma 05/2022 --> Tx'd with  pembrolizumab    Osteoarthritis    Osteoporosis    Primary hypertension    Seasonal allergies    Sleep apnea    a.) no nocturnal PAP therapy since  2021   Vitamin D  deficiency     SURGICAL HISTORY: Past Surgical History:  Procedure Laterality Date   ABDOMINAL AORTIC ENDOVASCULAR STENT GRAFT  03/27/2013   Dr. Cordella Shawl   ABDOMINAL HYSTERECTOMY     Menometrorrhagia. Excessive bleeding. Unknown if cervix removed.    BILATERAL SALPINGOOPHORECTOMY  09/27/2023   CATARACT EXTRACTION W/PHACO Left 05/14/2023   Procedure: CATARACT EXTRACTION PHACO AND INTRAOCULAR LENS PLACEMENT (IOC) LEFT DIABETIC  OMIDRIA   19.40  01:34.8;  Surgeon: Enola Feliciano Hugger, MD;  Location: [facility];  Service: Ophthalmology;  Laterality: Left;   CATARACT EXTRACTION W/PHACO Right 06/27/2023   Procedure: CATARACT EXTRACTION PHACO AND INTRAOCULAR LENS PLACEMENT (IOC) RIGHT DIABETIC  6.47  00:42.6;  Surgeon: Enola Feliciano Hugger, MD;  Location: [facility];  Service: Ophthalmology;  Laterality: Right;   COLONOSCOPY WITH PROPOFOL  N/A 01/05/2021   Procedure: COLONOSCOPY WITH PROPOFOL ;  Surgeon: Janalyn Keene NOVAK, MD;  Location: [facility] ENDOSCOPY;  Service: Endoscopy;  Laterality: N/A;   CORONARY ANGIOPLASTY WITH STENT PLACEMENT  03/16/2009   Procedure: CORONARY ANGIOPLASTY WITH STENT PLACEMENT; Location: [facility]; Surgeons: Vinie Jude, MD (diagnostic) and Deatrice Cage, MD (interventional)   ENDARTERECTOMY Right 11/09/2023   Procedure: ENDARTERECTOMY CAROTID;  Surgeon: Shawl Cordella  G, MD;  Location: [facility] ORS;  Service: Vascular;  Laterality: Right;   ESOPHAGOGASTRODUODENOSCOPY (EGD) WITH PROPOFOL  N/A 01/05/2021   Procedure: ESOPHAGOGASTRODUODENOSCOPY (EGD) WITH PROPOFOL ;  Surgeon: Janalyn Keene NOVAK, MD;  Location: [facility] ENDOSCOPY;  Service: Endoscopy;  Laterality: N/A;   EXTRACORPOREAL SHOCK WAVE LITHOTRIPSY Right 1999   INTERCOSTAL NERVE BLOCK Left 04/06/2021   Procedure: INTERCOSTAL NERVE BLOCK;  Surgeon: Kerrin Elspeth BROCKS, MD;  Location: [facility] OR;  Service: Thoracic;  Laterality: Left;   IR IMAGING GUIDED PORT INSERTION  06/20/2022    LUNG LOBECTOMY Left 04/06/2021   robotic left lower lobectomy 04/06/2021 Dr. Kerrin for Stage 1A adenocarcinoma   NODE DISSECTION Left 04/06/2021   Procedure: NODE DISSECTION;  Surgeon: Kerrin Elspeth BROCKS, MD;  Location: [facility] OR;  Service: Thoracic;  Laterality: Left;   TUBAL LIGATION      SOCIAL HISTORY: Social History   Socioeconomic History   Marital status: Divorced    Spouse name: Not on file   Number of children: 2   Years of education: H/S   Highest education level: 12th grade  Occupational History   Occupation: Disabled   Occupation: retired  Tobacco Use   Smoking status: Every Day    Current packs/day: 1.00    Average packs/day: 1 pack/day for 51.5 years (51.5 ttl pk-yrs)    Types: Cigarettes    Start date: 1974   Smokeless tobacco: Never   Tobacco comments:    12/23/19 states she quit for 9 months and then started back.     02/20/24 1/2 ppd  Vaping Use   Vaping status: Never Used  Substance and Sexual Activity   Alcohol  use: Not Currently   Drug use: No   Sexual activity: Not on file  Other Topics Concern   Not on file  Social History Narrative   Hx of smoking; quit prior to lung surgery then started back. Lives in [place] alone. Used to work in [employer], [employer]- Facilities manager. On disability sec to spinal pain.    Social Drivers of Health   Financial Resource Strain: Medium Risk (02/20/2024)   Overall Financial Resource Strain (CARDIA)    Difficulty of Paying Living Expenses: Somewhat hard  Food Insecurity: No Food Insecurity (02/20/2024)   Hunger Vital Sign    Worried About Running Out of Food in the Last Year: Never true    Ran Out of Food in the Last Year: Never true  Transportation Needs: No Transportation Needs (02/20/2024)   PRAPARE - Administrator, Civil Service (Medical): No    Lack of Transportation (Non-Medical): No  Physical Activity: Inactive (02/20/2024)   Exercise Vital Sign    Days of Exercise per Week: 0 days    Minutes of Exercise  per Session: 0 min  Stress: Stress Concern Present (02/20/2024)   [employer] of Occupational Health - Occupational Stress Questionnaire    Feeling of Stress : To some extent  Social Connections: Socially Isolated (02/20/2024)   Social Connection and Isolation Panel    Frequency of Communication with Friends and Family: More than three times a week    Frequency of Social Gatherings with Friends and Family: Once a week    Attends Religious Services: Never    Database administrator or Organizations: No    Attends Banker Meetings: Never    Marital Status: Divorced  Catering manager Violence: Not At Risk (02/20/2024)   Humiliation, Afraid, Rape, and Kick questionnaire    Fear of Current or Ex-Partner: No  Emotionally Abused: No    Physically Abused: No    Sexually Abused: No    FAMILY HISTORY: Family History  Problem Relation Age of Onset   Hypertension Mother    Coronary artery disease Mother    Heart attack Mother        acute   Cancer Mother    Alcohol  abuse Father    Depression Father    Hypertension Father    Heart attack Father 81       acute   Alcohol  abuse Sister    Hyperlipidemia Sister    Hypertension Sister    Cancer Sister 36   Heart attack Sister        x's 2   Coronary artery disease Sister 62       x's 2   Breast cancer Sister 70    ALLERGIES:  is allergic to atorvastatin, omeprazole, and bupropion .  MEDICATIONS:  Current Outpatient Medications  Medication Sig Dispense Refill   acetaminophen  (TYLENOL ) 500 MG tablet Take 500 mg by mouth every 6 (six) hours as needed for moderate pain (pain score 4-6).     albuterol  (VENTOLIN  HFA) 108 (90 Base) MCG/ACT inhaler Inhale 2 puffs into the lungs every 6 (six) hours as needed for wheezing or shortness of breath. 8.5 g 2   allopurinol  (ZYLOPRIM ) 100 MG tablet Take 1 tablet (100 mg total) by mouth daily. 90 tablet 4   aspirin  81 MG tablet Take 81 mg by mouth daily.      bisoprolol  (ZEBETA ) 10 MG  tablet Take 1 tablet (10 mg total) by mouth daily. 90 tablet 0   Budeson-Glycopyrrol-Formoterol  (BREZTRI  AEROSPHERE) 160-9-4.8 MCG/ACT AERO Inhale 2 puffs into the lungs in the morning and at bedtime. 1284 g 11   clopidogrel  (PLAVIX ) 75 MG tablet Take 1 tablet (75 mg total) by mouth daily. 90 tablet 1   ibuprofen (ADVIL) 200 MG tablet Take 200 mg by mouth 2 (two) times daily as needed (headaches).     lactulose  (CHRONULAC ) 10 GM/15ML solution Take 30 mLs (20 g total) by mouth 2 (two) times daily as needed for mild constipation. 236 mL 0   lidocaine -prilocaine  (EMLA ) cream Apply on the port. 30 -45 min  prior to port access. 30 g 3   lisinopril -hydrochlorothiazide  (ZESTORETIC ) 20-12.5 MG tablet Take 1 tablet by mouth daily. 90 tablet 2   magnesium gluconate (MAGONATE) 500 MG tablet Take 500 mg by mouth at bedtime.     oxyCODONE  (OXY IR/ROXICODONE ) 5 MG immediate release tablet Take 1-2 tablets (5-10 mg total) by mouth every 4 (four) hours as needed for moderate pain (pain score 4-6). (Patient not taking: Reported on 06/02/2024) 30 tablet 0   pantoprazole  (PROTONIX ) 20 MG tablet Take 1 tablet (20 mg total) by mouth daily. 90 tablet 4   Polyethyl Glyc-Propyl Glyc PF (SYSTANE PRESERVATIVE FREE) 0.4-0.3 % SOLN Place 1 drop into both eyes daily as needed (dry eyes).     prochlorperazine  (COMPAZINE ) 10 MG tablet Take 1 tablet (10 mg total) by mouth every 6 (six) hours as needed for nausea or vomiting. 45 tablet 1   rosuvastatin  (CRESTOR ) 20 MG tablet Take 1 tablet (20 mg total) by mouth daily. 90 tablet 4   Semaglutide ,0.25 or 0.5MG /DOS, (OZEMPIC , 0.25 OR 0.5 MG/DOSE,) 2 MG/1.5ML SOPN Inject 0.5 mg into the skin once a week. 6 mL 1   No current facility-administered medications for this visit.   Facility-Administered Medications Ordered in Other Visits  Medication Dose Route Frequency Provider Last Rate  Last Admin   heparin  lock flush 100 UNIT/ML injection            heparin  lock flush 100 unit/mL  500  Units Intracatheter Once PRN Sovereign Ramiro R, MD       pembrolizumab  (KEYTRUDA ) 200 mg in sodium chloride  0.9 % 50 mL chemo infusion  200 mg Intravenous Once Annarose Ouellet R, MD        Mild maculopapular rash on the Maller area left more than right.  PHYSICAL EXAMINATION: ECOG PERFORMANCE STATUS: 1 - Symptomatic but completely ambulatory  Vitals:   06/02/24 0934  BP: 110/81  Pulse: 71  Resp: 16  Temp: (!) 97 F (36.1 C)  SpO2: 100%        Filed Weights   06/02/24 0934  Weight: 179 lb 1.6 oz (81.2 kg)         Physical Exam HENT:     Head: Normocephalic and atraumatic.     Mouth/Throat:     Pharynx: No oropharyngeal exudate.   Eyes:     Pupils: Pupils are equal, round, and reactive to light.    Cardiovascular:     Rate and Rhythm: Normal rate and regular rhythm.  Pulmonary:     Comments: Decreased breath sounds bilaterally.  No wheeze or crackles Abdominal:     General: Bowel sounds are normal. There is no distension.     Palpations: Abdomen is soft. There is no mass.     Tenderness: There is no abdominal tenderness. There is no guarding or rebound.   Musculoskeletal:        General: No tenderness. Normal range of motion.     Cervical back: Normal range of motion and neck supple.   Skin:    General: Skin is warm.   Neurological:     Mental Status: She is alert and oriented to person, place, and time.   Psychiatric:        Mood and Affect: Affect normal.      LABORATORY DATA:  I have reviewed the data as listed Lab Results  Component Value Date   WBC 9.6 06/02/2024   HGB 14.5 06/02/2024   HCT 43.3 06/02/2024   MCV 85.1 06/02/2024   PLT 193 06/02/2024   Recent Labs    04/21/24 0820 05/12/24 0947 06/02/24 0951  NA 141 140 139  K 3.5 3.4* 3.5  CL 108 106 106  CO2 25 25 24   GLUCOSE 95 96 94  BUN 18 13 14   CREATININE 0.64 0.48 0.59  CALCIUM  9.1 8.9 9.3  GFRNONAA >60 >60 >60  PROT 6.5 6.8 6.7  ALBUMIN 3.6 3.7 3.9  AST 17  20 22   ALT 14 13 14   ALKPHOS 87 86 95  BILITOT 0.8 0.8 0.9    RADIOGRAPHIC STUDIES: I have personally reviewed the radiological images as listed and agreed with the findings in the report. No results found.     ASSESSMENT & PLAN:   Primary cancer of left lower lobe of lung (HCC) #JUNE 2023-STAGE IV--recurrent/metastatic disease with New left adrenal metastasis;  PET scan JUNE 20th- 2023-  Enlarged 4 cm hypermetabolic LEFT adrenal metastasis;  Suspicion of metastatic adenopathy to the LEFT hilum.  Part solid nodule in the RIGHT lower lobe without metabolic activity. 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 Currently on single agent Keytruda  [PD-L1 greater than 80%].   MARCH 3rd, 2025- CT CAP-  No evidence of recurrent or metastatic disease.  Ground-glass atypical pulmonary cysts in the right lower lobe- stable.   # DEC 17th, 2024- last Keytruda . Given MARCH 3rd, 2025- CT scans negative for any recurrent malignancy. Continue with keytruda  immunotherapy [until aug 2025 x2 years]- will order CT scan today  # Nausea/vomiting- ? GI illness- resolved s/p compazine . Likely unrelated to keytruda -   ? Related to Ozempic - awaiting re-evaluation with PCP. Stable.    # Mild Hypokalemia: discussed dietary supplement.Stable.    # Chronic pain: headaches/cervical pain/back pain [Dr.Naveira; pain doctor]  On NSAIDs [ monitor for now. OCT 2023- MRI brain- NEGATIVE for any metastatic disease.   Stable.  FEB 2024- MRI lumbar spine- degenerative disease- on ibuprofen prn- BID. Stable.    # Left rib pain-Post throacotomy pain/tingling and numbness: stable [  DIS-Continue gabapentin  -300 mg TID; and then at extra at night prn].   stable  # CAD/ PVD- cramping- s/p evaluation [Dr.Schneir]; ? Carotid occlusion- s/p CEA surgery-  stable  # COPD-stable encouraged continue to avoid smoking; again  counseled to quit smoking; s/p pulmonary stable  # vit D def [April 2025-PCP - 28- recommend vitD Supp BID- OTC; will check in 6 weeks- if not improved then ergo weekly-stable  # ACP: [03/31/2024] -Full code.  # IV Access :s/p  port placement stable  *AM appts- Monday- con-CT  PS # DISPOSITION:  #  keytruda  today # follow up in 3 weeks- -MD labs/port- cbc/cmp; possible Keytruda ;  CT-DRI # follow up in 6 weeks- -MD labs/port- cbc/cmp;  ;possible Keytruda ;Dr. KATHEE Cindy JONELLE Rennie, MD 06/02/2024 10:33 AM

## 2024-06-02 NOTE — Progress Notes (Signed)
 Appetite 50% normal, nothing tastes good. Having some nausea and vomiting coming and going. She called PCP last week, no call back.  Trying to discuss ozempic .

## 2024-06-02 NOTE — Assessment & Plan Note (Addendum)
#  JUNE 2023-STAGE IV--recurrent/metastatic disease with New left adrenal metastasis;  PET scan JUNE 20th- 2023-  Enlarged 4 cm hypermetabolic LEFT adrenal metastasis;  Suspicion of metastatic adenopathy to the LEFT hilum.  Part solid nodule in the RIGHT lower lobe without metabolic activity. 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 Currently on single agent Keytruda  [PD-L1 greater than 80%].   MARCH 3rd, 2025- CT CAP-  No evidence of recurrent or metastatic disease.  Ground-glass atypical pulmonary cysts in the right lower lobe- stable.   # DEC 17th, 2024- last Keytruda . Given MARCH 3rd, 2025- CT scans negative for any recurrent malignancy. Continue with keytruda  immunotherapy [until aug 2025 x2 years]- will order CT scan today  # Nausea/vomiting- ? GI illness- resolved s/p compazine . Likely unrelated to keytruda -   ? Related to Ozempic - awaiting re-evaluation with PCP. Stable.    # Mild Hypokalemia: discussed dietary supplement.Stable.    # Chronic pain: headaches/cervical pain/back pain [Dr.Naveira; pain doctor]  On NSAIDs [ monitor for now. OCT 2023- MRI brain- NEGATIVE for any metastatic disease.   Stable.  FEB 2024- MRI lumbar spine- degenerative disease- on ibuprofen prn- BID. Stable.    # Left rib pain-Post throacotomy pain/tingling and numbness: stable [  DIS-Continue gabapentin  -300 mg TID; and then at extra at night prn].   stable  # CAD/ PVD- cramping- s/p evaluation [Dr.Schneir]; ? Carotid occlusion- s/p CEA surgery-  stable  # COPD-stable encouraged continue to avoid smoking; again counseled to quit smoking; s/p pulmonary stable  # vit D def [April 2025-PCP - 28- recommend vitD Supp BID- OTC; will check in 6 weeks- if not improved then ergo weekly-stable  # ACP: [03/31/2024] -Full code.  # IV Access :s/p  port placement stable  *AM appts- Monday- con-CT  PS #  DISPOSITION:  #  keytruda  today # follow up in 3 weeks- -MD labs/port- cbc/cmp; possible Keytruda ;  CT-DRI # follow up in 6 weeks- -MD labs/port- cbc/cmp;  ;possible Keytruda ;Dr. KATHEE

## 2024-06-02 NOTE — Patient Instructions (Signed)
 [facility] - A DEPT OF [facility]  Discharge Instructions: Thank you for choosing [facility] to provide your oncology and hematology care.  If you have a lab appointment with the Cancer Center, please go directly to the Cancer Center and check in at the registration area.  Wear comfortable clothing and clothing appropriate for easy access to any Portacath or PICC line.   We strive to give you quality time with your provider. You may need to reschedule your appointment if you arrive late (15 or more minutes).  Arriving late affects you and other patients whose appointments are after yours.  Also, if you miss three or more appointments without notifying the office, you may be dismissed from the clinic at the provider's discretion.      For prescription refill requests, have your pharmacy contact our office and allow 72 hours for refills to be completed.    Today you received the following chemotherapy and/or immunotherapy agents Rande Lawman      To help prevent nausea and vomiting after your treatment, we encourage you to take your nausea medication as directed.  BELOW ARE SYMPTOMS THAT SHOULD BE REPORTED IMMEDIATELY: *FEVER GREATER THAN 100.4 F (38 C) OR HIGHER *CHILLS OR SWEATING *NAUSEA AND VOMITING THAT IS NOT CONTROLLED WITH YOUR NAUSEA MEDICATION *UNUSUAL SHORTNESS OF BREATH *UNUSUAL BRUISING OR BLEEDING *URINARY PROBLEMS (pain or burning when urinating, or frequent urination) *BOWEL PROBLEMS (unusual diarrhea, constipation, pain near the anus) TENDERNESS IN MOUTH AND THROAT WITH OR WITHOUT PRESENCE OF ULCERS (sore throat, sores in mouth, or a toothache) UNUSUAL RASH, SWELLING OR PAIN  UNUSUAL VAGINAL DISCHARGE OR ITCHING   Items with * indicate a potential emergency and should be followed up as soon as possible or go to the Emergency Department if any problems should occur.  Please show the CHEMOTHERAPY ALERT CARD or IMMUNOTHERAPY  ALERT CARD at check-in to the Emergency Department and triage nurse.  Should you have questions after your visit or need to cancel or reschedule your appointment, please contact [facility] - A DEPT OF [facility]  [phone] and follow the prompts.  Office hours are 8:00 a.m. to 4:30 p.m. Monday - Friday. Please note that voicemails left after 4:00 p.m. may not be returned until the following business day.  We are closed weekends and major holidays. You have access to a nurse at all times for urgent questions. Please call the main number to the clinic [phone] and follow the prompts.  For any non-urgent questions, you may also contact your provider using MyChart. We now offer e-Visits for anyone 48 and older to request care online for non-urgent symptoms. For details visit [email or web address].   Also download the MyChart app! Go to the app store, search "MyChart", open the app, select [facility], and log in with your MyChart username and password.

## 2024-06-09 ENCOUNTER — Encounter (INDEPENDENT_AMBULATORY_CARE_PROVIDER_SITE_OTHER): Payer: Medicare Other

## 2024-06-09 ENCOUNTER — Ambulatory Visit (INDEPENDENT_AMBULATORY_CARE_PROVIDER_SITE_OTHER): Payer: Medicare Other | Admitting: Vascular Surgery

## 2024-06-09 ENCOUNTER — Ambulatory Visit
Admission: RE | Admit: 2024-06-09 | Discharge: 2024-06-09 | Disposition: A | Discharge status: Home / Self Care | Source: Ambulatory Visit | Attending: Internal Medicine

## 2024-06-09 ENCOUNTER — Encounter: Payer: Self-pay | Admitting: Internal Medicine

## 2024-06-09 DIAGNOSIS — J984 Other disorders of lung: Secondary | ICD-10-CM | POA: Diagnosis not present

## 2024-06-09 DIAGNOSIS — J439 Emphysema, unspecified: Secondary | ICD-10-CM | POA: Diagnosis not present

## 2024-06-09 DIAGNOSIS — I2510 Atherosclerotic heart disease of native coronary artery without angina pectoris: Secondary | ICD-10-CM | POA: Diagnosis not present

## 2024-06-09 DIAGNOSIS — C3490 Malignant neoplasm of unspecified part of unspecified bronchus or lung: Secondary | ICD-10-CM | POA: Diagnosis not present

## 2024-06-09 DIAGNOSIS — C3432 Malignant neoplasm of lower lobe, left bronchus or lung: Secondary | ICD-10-CM

## 2024-06-19 DIAGNOSIS — J439 Emphysema, unspecified: Secondary | ICD-10-CM | POA: Diagnosis not present

## 2024-06-19 DIAGNOSIS — I10 Essential (primary) hypertension: Secondary | ICD-10-CM | POA: Diagnosis not present

## 2024-06-19 DIAGNOSIS — Z720 Tobacco use: Secondary | ICD-10-CM | POA: Diagnosis not present

## 2024-06-19 DIAGNOSIS — I2510 Atherosclerotic heart disease of native coronary artery without angina pectoris: Secondary | ICD-10-CM | POA: Diagnosis not present

## 2024-06-19 DIAGNOSIS — E782 Mixed hyperlipidemia: Secondary | ICD-10-CM | POA: Diagnosis not present

## 2024-06-23 ENCOUNTER — Encounter: Payer: Self-pay | Admitting: Internal Medicine

## 2024-06-23 ENCOUNTER — Inpatient Hospital Stay: Attending: Internal Medicine | Admitting: Internal Medicine

## 2024-06-23 ENCOUNTER — Inpatient Hospital Stay

## 2024-06-23 VITALS — BP 112/83 | HR 69 | Temp 97.5°F | Resp 16 | Ht 68.0 in | Wt 183.5 lb

## 2024-06-23 VITALS — BP 134/71

## 2024-06-23 DIAGNOSIS — C7972 Secondary malignant neoplasm of left adrenal gland: Secondary | ICD-10-CM | POA: Insufficient documentation

## 2024-06-23 DIAGNOSIS — F17210 Nicotine dependence, cigarettes, uncomplicated: Secondary | ICD-10-CM | POA: Diagnosis not present

## 2024-06-23 DIAGNOSIS — C3432 Malignant neoplasm of lower lobe, left bronchus or lung: Secondary | ICD-10-CM

## 2024-06-23 DIAGNOSIS — E559 Vitamin D deficiency, unspecified: Secondary | ICD-10-CM | POA: Diagnosis not present

## 2024-06-23 DIAGNOSIS — Z79899 Other long term (current) drug therapy: Secondary | ICD-10-CM

## 2024-06-23 DIAGNOSIS — Z79620 Long term (current) use of immunosuppressive biologic: Secondary | ICD-10-CM | POA: Diagnosis not present

## 2024-06-23 DIAGNOSIS — Z5112 Encounter for antineoplastic immunotherapy: Secondary | ICD-10-CM | POA: Insufficient documentation

## 2024-06-23 DIAGNOSIS — E039 Hypothyroidism, unspecified: Secondary | ICD-10-CM

## 2024-06-23 LAB — CBC WITH DIFFERENTIAL/PLATELET
Abs Immature Granulocytes: 0.01 K/uL (ref 0.00–0.07)
Basophils Absolute: 0 K/uL (ref 0.0–0.1)
Basophils Relative: 1 %
Eosinophils Absolute: 0.4 K/uL (ref 0.0–0.5)
Eosinophils Relative: 6 %
HCT: 42.2 % (ref 36.0–46.0)
Hemoglobin: 14.1 g/dL (ref 12.0–15.0)
Immature Granulocytes: 0 %
Lymphocytes Relative: 34 %
Lymphs Abs: 2.1 K/uL (ref 0.7–4.0)
MCH: 28.4 pg (ref 26.0–34.0)
MCHC: 33.4 g/dL (ref 30.0–36.0)
MCV: 85.1 fL (ref 80.0–100.0)
Monocytes Absolute: 0.4 K/uL (ref 0.1–1.0)
Monocytes Relative: 7 %
Neutro Abs: 3.1 K/uL (ref 1.7–7.7)
Neutrophils Relative %: 52 %
Platelets: 178 K/uL (ref 150–400)
RBC: 4.96 MIL/uL (ref 3.87–5.11)
RDW: 13.7 % (ref 11.5–15.5)
WBC: 6.1 K/uL (ref 4.0–10.5)
nRBC: 0 % (ref 0.0–0.2)

## 2024-06-23 LAB — COMPREHENSIVE METABOLIC PANEL WITH GFR
ALT: 16 U/L (ref 0–44)
AST: 21 U/L (ref 15–41)
Albumin: 3.7 g/dL (ref 3.5–5.0)
Alkaline Phosphatase: 85 U/L (ref 38–126)
Anion gap: 7 (ref 5–15)
BUN: 16 mg/dL (ref 8–23)
CO2: 24 mmol/L (ref 22–32)
Calcium: 9.2 mg/dL (ref 8.9–10.3)
Chloride: 109 mmol/L (ref 98–111)
Creatinine, Ser: 0.57 mg/dL (ref 0.44–1.00)
GFR, Estimated: >60 mL/min (ref >60)
Glucose, Bld: 95 mg/dL (ref 70–99)
Potassium: 3.5 mmol/L (ref 3.5–5.1)
Sodium: 140 mmol/L (ref 135–145)
Total Bilirubin: 0.7 mg/dL (ref 0.0–1.2)
Total Protein: 6.3 g/dL — ABNORMAL LOW (ref 6.5–8.1)

## 2024-06-23 LAB — VITAMIN D 25 HYDROXY (VIT D DEFICIENCY, FRACTURES): Vit D, 25-Hydroxy: 39.61 ng/mL (ref 30–100)

## 2024-06-23 LAB — TSH: TSH: 3.246 u[IU]/mL (ref 0.350–4.500)

## 2024-06-23 MED ORDER — HEPARIN SOD (PORK) LOCK FLUSH 100 UNIT/ML IV SOLN
500.0000 [IU] | Freq: Once | INTRAVENOUS | Status: AC | PRN
Start: 2024-06-23 — End: 2024-06-23
  Administered 2024-06-23: 500 [IU]
  Filled 2024-06-23: qty 5

## 2024-06-23 MED ORDER — SODIUM CHLORIDE 0.9% FLUSH
10.0000 mL | INTRAVENOUS | Status: DC | PRN
  Administered 2024-06-23: 10 mL via INTRAVENOUS
  Filled 2024-06-23: qty 10

## 2024-06-23 MED ORDER — SODIUM CHLORIDE 0.9 % IV SOLN
200.0000 mg | Freq: Once | INTRAVENOUS | Status: AC
  Administered 2024-06-23: 200 mg via INTRAVENOUS
  Filled 2024-06-23: qty 200

## 2024-06-23 MED ORDER — SODIUM CHLORIDE 0.9 % IV SOLN
Freq: Once | INTRAVENOUS | Status: AC
  Filled 2024-06-23: qty 250

## 2024-06-23 NOTE — Progress Notes (Signed)
 CT chest 06/09/24.

## 2024-06-23 NOTE — Assessment & Plan Note (Addendum)
#  JUNE 2023-STAGE IV--recurrent/metastatic disease with New left adrenal metastasis;  PET scan JUNE 20th- 2023-  Enlarged 4 cm hypermetabolic LEFT adrenal metastasis;  Suspicion of metastatic adenopathy to the LEFT hilum.  Part solid nodule in the RIGHT lower lobe without metabolic activity. 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 Currently on single agent Keytruda  [PD-L1 greater than 80%].   MARCH 3rd, 2025- CT CAP-  No evidence of recurrent or metastatic disease.  Ground-glass atypical pulmonary cysts in the right lower lobe- stable.   JULY 7th, 2025- . Slight interval progression of bilateral perihilar and upper lung zone subtle ground-glass peribronchial pulmonary infiltrate which may reflect changes infectious or inflammatory bronchiolitis, smoking related lung disease or hypersensitivity pneumonitis.  Stable 12 mm atypical pulmonary cysts demonstrating ground-glass components within the right lower lobe. Continued follow-up is warranted to exclude indolent adenocarcinoma.  Will continue to follow.   # Continue with keytruda  immunotherapy [? until aug 2025 x2 years]-proceed with treatment today.  # Nausea/vomiting-   resolved s/p compazine . Likely unrelated to keytruda -   improved on lower dose of  Ozempic -   # Mild Hypokalemia: discussed dietary supplement.Stable.    # Chronic pain: headaches/cervical pain/back pain [Dr.Naveira; pain doctor]  On NSAIDs [ monitor for now. OCT 2023- MRI brain- NEGATIVE for any metastatic disease.   Stable.  FEB 2024- MRI lumbar spine- degenerative disease- on ibuprofen prn- BID. Stable.    # Left rib pain-Post throacotomy pain/tingling and numbness: stable [  DIS-Continue gabapentin  -300 mg TID; and then at extra at night prn].   stable  # CAD/ PVD- cramping- s/p evaluation [Dr.Schneir]; ? Carotid occlusion- s/p CEA surgery- CT JULY 2025  Extensive  atherosclerotic calcification within the thoracic aorta with almost certain hemodynamically significant stenosis of the left subclavian artery to repeat origin, not well assessed on this noncontrast examination. There is clinical evidence of vertebrobasilar or left upper extremity arterial insufficiency.  Informed Dr. Jama.  Awaiting re-evaluation with vascular next week.   # COPD-stable encouraged continue to avoid smoking; again counseled to quit smoking; s/p pulmonary stable   # vit D def [April 2025-PCP - 28- recommend vitD Supp BID- OTC; will check in 6 weeks- if not improved then ergo weekly-stable  # ACP: [03/31/2024] -Full code.  # IV Access :s/p  port placement stable  *AM appts- Monday-   PS # DISPOSITION:  #  keytruda  today # follow up in 3 weeks- -MD labs/port- cbc/cmp; possible Keytruda ;  # follow up in 6 weeks- -MD labs/port- cbc/cmp;  ;possible Keytruda ;Dr. B  # I reviewed the blood work- with the patient in detail; also reviewed the imaging independently [as summarized above]; and with the patient in detail.

## 2024-06-23 NOTE — Patient Instructions (Addendum)

## 2024-06-23 NOTE — Progress Notes (Signed)
 [facility] CONSULT NOTE  Patient Care Team: [name], MD as PCP - General (Family Medicine) Schnier, Cordella MATSU, MD (Vascular Surgery) Janalyn Keene NOVAK, MD (Inactive) as Consulting Physician (Gastroenterology) Verdene Gills, RN as Oncology Nurse Navigator Rennie Cindy SAUNDERS, MD as Consulting Physician (Oncology) Isadora Hose, MD as Consulting Physician (Pulmonary Disease) Ammon Blunt, MD as Consulting Physician (Cardiology) Enola Feliciano Hugger, MD as Consulting Physician (Ophthalmology)   CHIEF COMPLAINTS/PURPOSE OF CONSULTATION: lung cancer  #  Oncology History Overview Note  #MAY 2022- LLL nodule 2.3 cm Adenocarcinoma Stage IA; s/p lobectomy.  No adjuvant therapy  # CT scan 4th June 2023-highly concerning for recurrent/metastatic disease with-. New left adrenal metastasis;  Ground-glass and part solid nodules in the peripheral right lower lobe, unchanged from 11/07/2021 but slightly enlarged from 01/01/2018. Findings are suspicious for indolent adenocarcinoma.  F One- PFL1 =80%; KRAS G12C PET scan JUNE 20th- 2023-  Enlarged 4 cm hypermetabolic LEFT adrenal metastasis;  Suspicion of metastatic adenopathy to the LEFT hilum.  Part solid nodule in the RIGHT lower lobe without metabolic activity.   3 ADRENAL BIOPSY- CK7 POSITIVE ADENOCARCINOMA- There is limited tissue remaining for ancillary testing, not likely  sufficient for NGS testing.   # AUG 7th, 2023-single agent Keytruda .    Right mildly complex cystic adnexal mass noted-June 13th, 2024- Significant interval enlargement of a multiseptated cystic lesion of the right ovary, now measuring 11.7 x 8.2 cm, previously 8.1 x 5.8 cm.- s/p in [facility] [October, 2024]- mucinous cystadenoma - stable.   Primary cancer of left lower lobe of lung (HCC)  05/09/2021 Initial Diagnosis   Primary cancer of left lower lobe of lung (HCC)   07/04/2022 -  Chemotherapy   Patient is on Treatment Plan : LUNG NSCLC  Pembrolizumab  (200) q21d     07/10/2022 - 07/10/2022 Chemotherapy   Patient is on Treatment Plan : LUNG NSCLC Pembrolizumab  (200) q21d      HISTORY OF PRESENTING ILLNESS: Patient  ambulating.  Alone.   Laura Nielsen 68 y.o.  female history of smoking; prior history of lung cancer-with likely recurrence to left adrenal gland [s/p Bx CK-7 positive TTF-1 negative]-clinically suggestive of recurrent lung cancer on single agent Keytruda  is here for follow-up and review results of the CT scan.  Patient noted to have improvement of the nausea and constipation since lowering the dose of Ozempic .  She also had a recent evaluation with cardiology.  Patient has chronic concerns of consistent headache.  Not any worse.    Denies any abdominal pain. Otherwise patient continues to have mild shortness of breath on exertion. Complains of cramping in the legs chronic.   Review of Systems  Constitutional:  Negative for chills, diaphoresis, fever, malaise/fatigue and weight loss.  HENT:  Negative for nosebleeds and sore throat.   Eyes:  Negative for double vision.  Respiratory:  Negative for cough, hemoptysis, sputum production, shortness of breath and wheezing.   Cardiovascular:  Negative for palpitations, orthopnea and leg swelling.  Gastrointestinal:  Negative for abdominal pain, blood in stool, constipation, diarrhea, heartburn, melena, nausea and vomiting.  Genitourinary:  Negative for dysuria, frequency and urgency.  Musculoskeletal:  Positive for back pain and joint pain.  Skin: Negative.  Negative for itching and rash.  Neurological:  Positive for tingling. Negative for dizziness, focal weakness, weakness and headaches.  Endo/Heme/Allergies:  Does not bruise/bleed easily.  Psychiatric/Behavioral:  Negative for depression. The patient is not nervous/anxious and does not have insomnia.  MEDICAL HISTORY:  Past Medical History:  Diagnosis Date   AAA (abdominal aortic aneurysm) (HCC)    a.)  s/p EVAR stent graft repair 03/27/2013   Adnexal mass    Aortic atherosclerosis (HCC)    Arthritis    Asthma    Atherosclerosis of native artery of both lower extremities with intermittent claudication (HCC)    B12 deficiency    Bilateral carotid artery stenosis    a.) doppler 05/06/2020 and 06/30/2021: 1-39% BICA; b.) doppler 11/20/2022: 60-79% RICA; c.) CTA neck 08/17/2023: >75% RICA, 30% LICA   CAD (coronary artery disease) 03/16/2009   a.) LHC/PCI 03/16/2009: 25% mLCx, 70% mRCA (3 x 18 mm Xience V DES); b.) MV 10/22/2017 and 03/16/2021: no ischemia; c.) MV 07/16/2023: mild apical ischemia   Cancer of left adrenal gland (HCC) 06/20/2022   a.) CNB 06/20/2022 -> pathology (+) for CK7 (TTF-1 -) adenocarcinoma   Centrilobular emphysema (HCC)    Cervical spinal stenosis    Cervical spondylosis with radiculopathy    Chronic back pain    Colon polyp    Complex ovarian cyst    Complication of anesthesia    a.) prone to experience postoperative hypotension   COPD (chronic obstructive pulmonary disease) (HCC)    DDD (degenerative disc disease), cervical    DDD (degenerative disc disease), thoracolumbar    Depression    Diabetes mellitus type 2, controlled (HCC)    Diastolic dysfunction 06/29/2023   a.) TTE 06/29/2023: EF >55%, no RWMAs, G1DD, triv PR, mild MR/TR   Fatty liver    GERD (gastroesophageal reflux disease)    Gout    History of bilateral cataract extraction    History of kidney stones    Hypercholesteremia    Long term (current) use of aspirin     Long term current use of clopidogrel     Lumbar spinal stenosis    Lumbar spondylosis    Non-small cell lung cancer metastatic to adrenal gland (HCC)    a.) s/p LLL wedge resection 04/06/2021 --> pathology (+) for well differentiated adenocarcinoma (stage 1A) --> no adjuvant Tx; b.) recurrent stage IV adenocarcinoma 05/2022 --> Tx'd with  pembrolizumab    Osteoarthritis    Osteoporosis    Primary hypertension    Seasonal  allergies    Sleep apnea    a.) no nocturnal PAP therapy since 2021   Vitamin D  deficiency     SURGICAL HISTORY: Past Surgical History:  Procedure Laterality Date   ABDOMINAL AORTIC ENDOVASCULAR STENT GRAFT  03/27/2013   Dr. Cordella Shawl   ABDOMINAL HYSTERECTOMY     Menometrorrhagia. Excessive bleeding. Unknown if cervix removed.    BILATERAL SALPINGOOPHORECTOMY  09/27/2023   CATARACT EXTRACTION W/PHACO Left 05/14/2023   Procedure: CATARACT EXTRACTION PHACO AND INTRAOCULAR LENS PLACEMENT (IOC) LEFT DIABETIC  OMIDRIA   19.40  01:34.8;  Surgeon: Enola Feliciano Hugger, MD;  Location: [facility];  Service: Ophthalmology;  Laterality: Left;   CATARACT EXTRACTION W/PHACO Right 06/27/2023   Procedure: CATARACT EXTRACTION PHACO AND INTRAOCULAR LENS PLACEMENT (IOC) RIGHT DIABETIC  6.47  00:42.6;  Surgeon: Enola Feliciano Hugger, MD;  Location: [facility];  Service: Ophthalmology;  Laterality: Right;   COLONOSCOPY WITH PROPOFOL  N/A 01/05/2021   Procedure: COLONOSCOPY WITH PROPOFOL ;  Surgeon: Janalyn Keene NOVAK, MD;  Location: [facility];  Service: Endoscopy;  Laterality: N/A;   CORONARY ANGIOPLASTY WITH STENT PLACEMENT  03/16/2009   Procedure: CORONARY ANGIOPLASTY WITH STENT PLACEMENT; Location: [facility]; Surgeons: Vinie Jude, MD (diagnostic) and Deatrice Cage, MD (interventional)   ENDARTERECTOMY  Right 11/09/2023   Procedure: ENDARTERECTOMY CAROTID;  Surgeon: Jama Cordella MATSU, MD;  Location: [facility] ORS;  Service: Vascular;  Laterality: Right;   ESOPHAGOGASTRODUODENOSCOPY (EGD) WITH PROPOFOL  N/A 01/05/2021   Procedure: ESOPHAGOGASTRODUODENOSCOPY (EGD) WITH PROPOFOL ;  Surgeon: Janalyn Keene NOVAK, MD;  Location: [facility] END[facility];  Service: Endoscopy;  Laterality: N/A;   EXTRACORPOREAL SHOCK WAVE LITHOTRIPSY Right 1999   INTERCOSTAL NERVE BLOCK Left 04/06/2021   Procedure: INTERCOSTAL NERVE BLOCK;  Surgeon: Kerrin Elspeth BROCKS, MD;  Location: [facility] OR;  Service: Thoracic;   Laterality: Left;   IR IMAGING GUIDED PORT INSERTION  06/20/2022   LUNG LOBECTOMY Left 04/06/2021   robotic left lower lobectomy 04/06/2021 Dr. Kerrin for Stage 1A adenocarcinoma   NODE DISSECTION Left 04/06/2021   Procedure: NODE DISSECTION;  Surgeon: Kerrin Elspeth BROCKS, MD;  Location: [facility] OR;  Service: Thoracic;  Laterality: Left;   TUBAL LIGATION      SOCIAL HISTORY: Social History   Socioeconomic History   Marital status: Divorced    Spouse name: Not on file   Number of children: 2   Years of education: H/S   Highest education level: 12th grade  Occupational History   Occupation: Disabled   Occupation: retired  Tobacco Use   Smoking status: Every Day    Current packs/day: 1.00    Average packs/day: 1 pack/day for 51.6 years (51.6 ttl pk-yrs)    Types: Cigarettes    Start date: 1974   Smokeless tobacco: Never   Tobacco comments:    12/23/19 states she quit for 9 months and then started back.     02/20/24 1/2 ppd  Vaping Use   Vaping status: Never Used  Substance and Sexual Activity   Alcohol  use: Not Currently   Drug use: No   Sexual activity: Not on file  Other Topics Concern   Not on file  Social History Narrative   Hx of smoking; quit prior to lung surgery then started back. Lives in [place] alone. Used to work in [employer], [employer]- Facilities manager. On disability sec to spinal pain.    Social Drivers of Health   Financial Resource Strain: Medium Risk (02/20/2024)   Overall Financial Resource Strain (CARDIA)    Difficulty of Paying Living Expenses: Somewhat hard  Food Insecurity: No Food Insecurity (02/20/2024)   Hunger Vital Sign    Worried About Running Out of Food in the Last Year: Never true    Ran Out of Food in the Last Year: Never true  Transportation Needs: No Transportation Needs (02/20/2024)   PRAPARE - Administrator, Civil Service (Medical): No    Lack of Transportation (Non-Medical): No  Physical Activity: Inactive (02/20/2024)   Exercise  Vital Sign    Days of Exercise per Week: 0 days    Minutes of Exercise per Session: 0 min  Stress: Stress Concern Present (02/20/2024)   [employer] of Occupational Health - Occupational Stress Questionnaire    Feeling of Stress : To some extent  Social Connections: Socially Isolated (02/20/2024)   Social Connection and Isolation Panel    Frequency of Communication with Friends and Family: More than three times a week    Frequency of Social Gatherings with Friends and Family: Once a week    Attends Religious Services: Never    Database administrator or Organizations: No    Attends Banker Meetings: Never    Marital Status: Divorced  Catering manager Violence: Not At Risk (02/20/2024)   Humiliation, Afraid, Rape, and Kick questionnaire  Fear of Current or Ex-Partner: No    Emotionally Abused: No    Physically Abused: No    Sexually Abused: No    FAMILY HISTORY: Family History  Problem Relation Age of Onset   Hypertension Mother    Coronary artery disease Mother    Heart attack Mother        acute   Cancer Mother    Alcohol  abuse Father    Depression Father    Hypertension Father    Heart attack Father 34       acute   Alcohol  abuse Sister    Hyperlipidemia Sister    Hypertension Sister    Cancer Sister 63   Heart attack Sister        x's 2   Coronary artery disease Sister 28       x's 2   Breast cancer Sister 57    ALLERGIES:  is allergic to atorvastatin, omeprazole, and bupropion .  MEDICATIONS:  Current Outpatient Medications  Medication Sig Dispense Refill   acetaminophen  (TYLENOL ) 500 MG tablet Take 500 mg by mouth every 6 (six) hours as needed for moderate pain (pain score 4-6).     albuterol  (VENTOLIN  HFA) 108 (90 Base) MCG/ACT inhaler Inhale 2 puffs into the lungs every 6 (six) hours as needed for wheezing or shortness of breath. 8.5 g 2   allopurinol  (ZYLOPRIM ) 100 MG tablet Take 1 tablet (100 mg total) by mouth daily. 90 tablet 4    aspirin  81 MG tablet Take 81 mg by mouth daily.      bisoprolol  (ZEBETA ) 10 MG tablet Take 1 tablet (10 mg total) by mouth daily. 90 tablet 0   Budeson-Glycopyrrol-Formoterol  (BREZTRI  AEROSPHERE) 160-9-4.8 MCG/ACT AERO Inhale 2 puffs into the lungs in the morning and at bedtime. 1284 g 11   clopidogrel  (PLAVIX ) 75 MG tablet Take 1 tablet (75 mg total) by mouth daily. 90 tablet 1   ibuprofen (ADVIL) 200 MG tablet Take 200 mg by mouth 2 (two) times daily as needed (headaches).     lactulose  (CHRONULAC ) 10 GM/15ML solution Take 30 mLs (20 g total) by mouth 2 (two) times daily as needed for mild constipation. 236 mL 0   lidocaine -prilocaine  (EMLA ) cream Apply on the port. 30 -45 min  prior to port access. 30 g 3   lisinopril -hydrochlorothiazide  (ZESTORETIC ) 20-12.5 MG tablet Take 1 tablet by mouth daily. 90 tablet 2   magnesium gluconate (MAGONATE) 500 MG tablet Take 500 mg by mouth at bedtime.     pantoprazole  (PROTONIX ) 20 MG tablet Take 1 tablet (20 mg total) by mouth daily. 90 tablet 4   Polyethyl Glyc-Propyl Glyc PF (SYSTANE PRESERVATIVE FREE) 0.4-0.3 % SOLN Place 1 drop into both eyes daily as needed (dry eyes).     prochlorperazine  (COMPAZINE ) 10 MG tablet Take 1 tablet (10 mg total) by mouth every 6 (six) hours as needed for nausea or vomiting. 45 tablet 1   rosuvastatin  (CRESTOR ) 20 MG tablet Take 1 tablet (20 mg total) by mouth daily. 90 tablet 4   Semaglutide ,0.25 or 0.5MG /DOS, (OZEMPIC , 0.25 OR 0.5 MG/DOSE,) 2 MG/1.5ML SOPN Inject 0.5 mg into the skin once a week. 6 mL 1   oxyCODONE  (OXY IR/ROXICODONE ) 5 MG immediate release tablet Take 1-2 tablets (5-10 mg total) by mouth every 4 (four) hours as needed for moderate pain (pain score 4-6). (Patient not taking: Reported on 06/23/2024) 30 tablet 0   No current facility-administered medications for this visit.   Facility-Administered Medications Ordered in Other  Visits  Medication Dose Route Frequency Provider Last Rate Last Admin   heparin   lock flush 100 UNIT/ML injection            sodium chloride  flush (NS) 0.9 % injection 10 mL  10 mL Intravenous PRN Aundrea Higginbotham R, MD   10 mL at 06/23/24 0812    Mild maculopapular rash on the Maller area left more than right.  PHYSICAL EXAMINATION: ECOG PERFORMANCE STATUS: 1 - Symptomatic but completely ambulatory  Vitals:   06/23/24 0813 06/23/24 0830  BP: (!) 132/95 112/83  Pulse: 69   Resp: 16   Temp: (!) 97.5 F (36.4 C)   SpO2: 100%         Filed Weights   06/23/24 0813  Weight: 183 lb 8 oz (83.2 kg)         Physical Exam HENT:     Head: Normocephalic and atraumatic.     Mouth/Throat:     Pharynx: No oropharyngeal exudate.  Eyes:     Pupils: Pupils are equal, round, and reactive to light.  Cardiovascular:     Rate and Rhythm: Normal rate and regular rhythm.  Pulmonary:     Comments: Decreased breath sounds bilaterally.  No wheeze or crackles Abdominal:     General: Bowel sounds are normal. There is no distension.     Palpations: Abdomen is soft. There is no mass.     Tenderness: There is no abdominal tenderness. There is no guarding or rebound.  Musculoskeletal:        General: No tenderness. Normal range of motion.     Cervical back: Normal range of motion and neck supple.  Skin:    General: Skin is warm.  Neurological:     Mental Status: She is alert and oriented to person, place, and time.  Psychiatric:        Mood and Affect: Affect normal.      LABORATORY DATA:  I have reviewed the data as listed Lab Results  Component Value Date   WBC 6.1 06/23/2024   HGB 14.1 06/23/2024   HCT 42.2 06/23/2024   MCV 85.1 06/23/2024   PLT 178 06/23/2024   Recent Labs    05/12/24 0947 06/02/24 0951 06/23/24 0812  NA 140 139 140  K 3.4* 3.5 3.5  CL 106 106 109  CO2 25 24 24   GLUCOSE 96 94 95  BUN 13 14 16   CREATININE 0.48 0.59 0.57  CALCIUM  8.9 9.3 9.2  GFRNONAA >60 >60 >60  PROT 6.8 6.7 6.3*  ALBUMIN 3.7 3.9 3.7  AST 20 22 21    ALT 13 14 16   ALKPHOS 86 95 85  BILITOT 0.8 0.9 0.7    RADIOGRAPHIC STUDIES: I have personally reviewed the radiological images as listed and agreed with the findings in the report. CT CHEST WO CONTRAST Result Date: 06/09/2024 CLINICAL DATA:  Lung cancer, undergoing immunotherapy EXAM: CT CHEST WITHOUT CONTRAST TECHNIQUE: Multidetector CT imaging of the chest was performed following the standard protocol without IV contrast. RADIATION DOSE REDUCTION: This exam was performed according to the departmental dose-optimization program which includes automated exposure control, adjustment of the mA and/or kV according to patient size and/or use of iterative reconstruction technique. COMPARISON:  02/04/2024 FINDINGS: Cardiovascular: Extensive multi-vessel coronary artery calcification. Global cardiac size within normal limits. No pericardial effusion. Central pulmonary arteries are of normal caliber. Extensive atherosclerotic calcification within the thoracic aorta with almost certain hemodynamically significant stenosis of the left subclavian artery to repeat origin, not well  assessed on this noncontrast examination. No aortic aneurysm. Right internal jugular chest port tip seen at the superior cavoatrial junction. Mediastinum/Nodes: No enlarged mediastinal or axillary lymph nodes. Thyroid  gland, trachea, and esophagus demonstrate no significant findings. Lungs/Pleura: Moderate emphysema. Stable 12 mm atypical pulmonary cyst demonstrating ground-glass components within the right lower lobe (91/3, 9 6/3). Slight interval progression of bilateral perihilar and upper lung zone subtle ground-glass peribronchial pulmonary infiltrate which may reflect changes infectious or inflammatory bronchiolitis, smoking related lung disease or hypersensitivity pneumonitis. No pneumothorax or pleural effusion. No central obstructing lesion. Upper Abdomen: No acute abnormality. Musculoskeletal: No acute bone abnormality. No lytic or  blastic bone lesion. Osseous structures are age appropriate. IMPRESSION: 1. Slight interval progression of bilateral perihilar and upper lung zone subtle ground-glass peribronchial pulmonary infiltrate which may reflect changes infectious or inflammatory bronchiolitis, smoking related lung disease or hypersensitivity pneumonitis. 2. Stable 12 mm atypical pulmonary cysts demonstrating ground-glass components within the right lower lobe. Continued follow-up is warranted to exclude indolent adenocarcinoma. 3. Moderate emphysema. 4. Extensive multi-vessel coronary artery calcification. 5. Extensive atherosclerotic calcification within the thoracic aorta with almost certain hemodynamically significant stenosis of the left subclavian artery to repeat origin, not well assessed on this noncontrast examination. There is clinical evidence of vertebrobasilar or left upper extremity arterial insufficiency, CT arteriography may be helpful for further evaluation. Aortic Atherosclerosis (ICD10-I70.0) and Emphysema (ICD10-J43.9). Electronically Signed   By: Dorethia Molt M.D.   On: 06/09/2024 20:25       ASSESSMENT & PLAN:   Primary cancer of left lower lobe of lung (HCC) #JUNE 2023-STAGE IV--recurrent/metastatic disease with New left adrenal metastasis;  PET scan JUNE 20th- 2023-  Enlarged 4 cm hypermetabolic LEFT adrenal metastasis;  Suspicion of metastatic adenopathy to the LEFT hilum.  Part solid nodule in the RIGHT lower lobe without metabolic activity. 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 Currently on single agent Keytruda  [PD-L1 greater than 80%].   MARCH 3rd, 2025- CT CAP-  No evidence of recurrent or metastatic disease.  Ground-glass atypical pulmonary cysts in the right lower lobe- stable.   JULY 7th, 2025- . Slight interval progression of bilateral perihilar and upper lung zone subtle  ground-glass peribronchial pulmonary infiltrate which may reflect changes infectious or inflammatory bronchiolitis, smoking related lung disease or hypersensitivity pneumonitis.  Stable 12 mm atypical pulmonary cysts demonstrating ground-glass components within the right lower lobe. Continued follow-up is warranted to exclude indolent adenocarcinoma.  Will continue to follow.   # Continue with keytruda  immunotherapy [? until aug 2025 x2 years]-proceed with treatment today.  # Nausea/vomiting-   resolved s/p compazine . Likely unrelated to keytruda -   improved on lower dose of  Ozempic -   # Mild Hypokalemia: discussed dietary supplement.Stable.    # Chronic pain: headaches/cervical pain/back pain [Dr.Naveira; pain doctor]  On NSAIDs [ monitor for now. OCT 2023- MRI brain- NEGATIVE for any metastatic disease.   Stable.  FEB 2024- MRI lumbar spine- degenerative disease- on ibuprofen prn- BID. Stable.    # Left rib pain-Post throacotomy pain/tingling and numbness: stable [  DIS-Continue gabapentin  -300 mg TID; and then at extra at night prn].   stable  # CAD/ PVD- cramping- s/p evaluation [Dr.Schneir]; ? Carotid occlusion- s/p CEA surgery- CT JULY 2025  Extensive atherosclerotic calcification within the thoracic aorta with almost certain hemodynamically significant stenosis of the left subclavian artery to repeat origin, not well assessed on  this noncontrast examination. There is clinical evidence of vertebrobasilar or left upper extremity arterial insufficiency.  Informed Dr. Jama.  Awaiting re-evaluation with vascular next week.   # COPD-stable encouraged continue to avoid smoking; again counseled to quit smoking; s/p pulmonary stable   # vit D def [April 2025-PCP - 29- recommend vitD Supp BID- OTC; will check in 6 weeks- if not improved then ergo weekly-stable  # ACP: [03/31/2024] -Full code.  # IV Access :s/p  port placement stable  *AM appts- Monday-   PS # DISPOSITION:  #  keytruda   today # follow up in 3 weeks- -MD labs/port- cbc/cmp; possible Keytruda ;  # follow up in 6 weeks- -MD labs/port- cbc/cmp;  ;possible Keytruda ;Dr. B  # I reviewed the blood work- with the patient in detail; also reviewed the imaging independently [as summarized above]; and with the patient in detail.         Cindy JONELLE Joe, MD 06/23/2024 9:18 AM

## 2024-06-24 LAB — T4: T4, Total: 6.8 ug/dL (ref 4.5–12.0)

## 2024-06-25 NOTE — Progress Notes (Signed)
 MRN : [id number]  Laura Nielsen is a 68 y.o. (03-14-1956) female who presents with chief complaint of check carotid arteries.  History of Present Illness:   The patient is seen for follow up evaluation of carotid stenosis status post right carotid endarterectomy on 11/09/2023.  There were no post operative problems or complications related to the surgery.  The patient denies neck or incisional pain.   The patient denies interval amaurosis fugax. There is no recent history of TIA symptoms or focal motor deficits. There is no prior documented CVA.   The patient is also asking about a left subclavian stenosis.  She recently had a CT scan of the chest with contrast dated February 04, 2024 shows moderate to severe atherosclerotic changes at the origin of the left subclavian.  A CT scan of the chest without contrast dated 06/09/2024 is reported as extensive atherosclerotic calcification within the thoracic aorta with almost certainly hemodynamically significant stenosis of the left subclavian artery at the origin, not well assessed on this noncontrast exam.  The patient denies arm claudication.  There are no ulcerations or open wounds or sores of the hand.  She denies vertebrobasilar symptoms.   No recent shortening of the patient's walking distance or new symptoms consistent with claudication.  No history of rest pain symptoms. No new ulcers or wounds of the lower extremities have occurred.   There is no history of DVT, PE or superficial thrombophlebitis. No recent episodes of angina or shortness of breath documented.   Duplex ultrasound of the carotid arteries done today shows RICA 1-39% and LICA 1-39%.  (Previous duplex ultrasound of the carotid arteries showed RICA 1-39% s/p rt CEA and LICA 1-39%).  Previous ABI's Rt=1.08 and Lt=0.97 (previous ABI's Rt=1.08 and Lt=1.09)  Duplex ultrasound of the abdominal aorta dated 11/20/2022 a small abdominal aortic aneurysm measuring  2.6 cm.  Previously placed stents are widely patent  No outpatient medications have been marked as taking for the 06/30/24 encounter (Appointment) with Jama, Cordella MATSU, MD.    Past Medical History:  Diagnosis Date   AAA (abdominal aortic aneurysm) (HCC)    a.) s/p EVAR stent graft repair 03/27/2013   Adnexal mass    Aortic atherosclerosis (HCC)    Arthritis    Asthma    Atherosclerosis of native artery of both lower extremities with intermittent claudication (HCC)    B12 deficiency    Bilateral carotid artery stenosis    a.) doppler 05/06/2020 and 06/30/2021: 1-39% BICA; b.) doppler 11/20/2022: 60-79% RICA; c.) CTA neck 08/17/2023: >75% RICA, 30% LICA   CAD (coronary artery disease) 03/16/2009   a.) LHC/PCI 03/16/2009: 25% mLCx, 70% mRCA (3 x 18 mm Xience V DES); b.) MV 10/22/2017 and 03/16/2021: no ischemia; c.) MV 07/16/2023: mild apical ischemia   Cancer of left adrenal gland (HCC) 06/20/2022   a.) CNB 06/20/2022 -> pathology (+) for CK7 (TTF-1 -) adenocarcinoma   Centrilobular emphysema (HCC)    Cervical spinal stenosis    Cervical spondylosis with radiculopathy    Chronic back pain    Colon polyp    Complex ovarian cyst    Complication of anesthesia    a.) prone to experience postoperative hypotension   COPD (chronic obstructive pulmonary disease) (HCC)    DDD (degenerative disc disease), cervical    DDD (degenerative disc disease), thoracolumbar    Depression  Diabetes mellitus type 2, controlled (HCC)    Diastolic dysfunction 06/29/2023   a.) TTE 06/29/2023: EF >55%, no RWMAs, G1DD, triv PR, mild MR/TR   Fatty liver    GERD (gastroesophageal reflux disease)    Gout    History of bilateral cataract extraction    History of kidney stones    Hypercholesteremia    Long term (current) use of aspirin     Long term current use of clopidogrel     Lumbar spinal stenosis    Lumbar spondylosis    Non-small cell lung cancer metastatic to adrenal gland (HCC)    a.) s/p LLL  wedge resection 04/06/2021 --> pathology (+) for well differentiated adenocarcinoma (stage 1A) --> no adjuvant Tx; b.) recurrent stage IV adenocarcinoma 05/2022 --> Tx'd with  pembrolizumab    Osteoarthritis    Osteoporosis    Primary hypertension    Seasonal allergies    Sleep apnea    a.) no nocturnal PAP therapy since 2021   Vitamin D  deficiency     Past Surgical History:  Procedure Laterality Date   ABDOMINAL AORTIC ENDOVASCULAR STENT GRAFT  03/27/2013   Dr. Cordella Shawl   ABDOMINAL HYSTERECTOMY     Menometrorrhagia. Excessive bleeding. Unknown if cervix removed.    BILATERAL SALPINGOOPHORECTOMY  09/27/2023   CATARACT EXTRACTION W/PHACO Left 05/14/2023   Procedure: CATARACT EXTRACTION PHACO AND INTRAOCULAR LENS PLACEMENT (IOC) LEFT DIABETIC  OMIDRIA   19.40  01:34.8;  Surgeon: Enola Feliciano Hugger, MD;  Location: [facility];  Service: Ophthalmology;  Laterality: Left;   CATARACT EXTRACTION W/PHACO Right 06/27/2023   Procedure: CATARACT EXTRACTION PHACO AND INTRAOCULAR LENS PLACEMENT (IOC) RIGHT DIABETIC  6.47  00:42.6;  Surgeon: Enola Feliciano Hugger, MD;  Location: [facility];  Service: Ophthalmology;  Laterality: Right;   COLONOSCOPY WITH PROPOFOL  N/A 01/05/2021   Procedure: COLONOSCOPY WITH PROPOFOL ;  Surgeon: Janalyn Keene NOVAK, MD;  Location: [facility];  Service: Endoscopy;  Laterality: N/A;   CORONARY ANGIOPLASTY WITH STENT PLACEMENT  03/16/2009   Procedure: CORONARY ANGIOPLASTY WITH STENT PLACEMENT; Location: [facility]; Surgeons: Vinie Jude, MD (diagnostic) and Deatrice Cage, MD (interventional)   ENDARTERECTOMY Right 11/09/2023   Procedure: ENDARTERECTOMY CAROTID;  Surgeon: Shawl Cordella MATSU, MD;  Location: [facility] OR[facility];  Service: Vascular;  Laterality: Right;   ESOPHAGOGASTRODUODENOSCOPY (EGD) WITH PROPOFOL  N/A 01/05/2021   Procedure: ESOPHAGOGASTRODUODENOSCOPY (EGD) WITH PROPOFOL ;  Surgeon: Janalyn Keene NOVAK, MD;  Location: [facility];  Service:  Endoscopy;  Laterality: N/A;   EXTRACORPOREAL SHOCK WAVE LITHOTRIPSY Right 1999   INTERCOSTAL NERVE BLOCK Left 04/06/2021   Procedure: INTERCOSTAL NERVE BLOCK;  Surgeon: Kerrin Elspeth BROCKS, MD;  Location: [facility] OR;  Service: Thoracic;  Laterality: Left;   IR IMAGING GUIDED PORT INSERTION  06/20/2022   LUNG LOBECTOMY Left 04/06/2021   robotic left lower lobectomy 04/06/2021 Dr. Kerrin for Stage 1A adenocarcinoma   NODE DISSECTION Left 04/06/2021   Procedure: NODE DISSECTION;  Surgeon: Kerrin Elspeth BROCKS, MD;  Location: [facility] OR;  Service: Thoracic;  Laterality: Left;   TUBAL LIGATION      Social History Social History   Tobacco Use   Smoking status: Every Day    Current packs/day: 1.00    Average packs/day: 1 pack/day for 51.6 years (51.6 ttl pk-yrs)    Types: Cigarettes    Start date: 1974   Smokeless tobacco: Never   Tobacco comments:    12/23/19 states she quit for 9 months and then started back.     02/20/24 1/2 ppd  Vaping Use   Vaping  status: Never Used  Substance Use Topics   Alcohol  use: Not Currently   Drug use: No    Family History Family History  Problem Relation Age of Onset   Hypertension Mother    Coronary artery disease Mother    Heart attack Mother        acute   Cancer Mother    Alcohol  abuse Father    Depression Father    Hypertension Father    Heart attack Father 72       acute   Alcohol  abuse Sister    Hyperlipidemia Sister    Hypertension Sister    Cancer Sister 38   Heart attack Sister        x's 2   Coronary artery disease Sister 52       x's 2   Breast cancer Sister 58    Allergies  Allergen Reactions   Atorvastatin Other (See Comments)    Elevated blood sugar    Omeprazole Nausea And Vomiting   Bupropion  Nausea Only    Only on the 150mg  tablets, but the 100mg  didn't help with smoking     REVIEW OF SYSTEMS (Negative unless checked)  Constitutional: [] Weight loss  [] Fever  [] Chills Cardiac: [] Chest pain   [] Chest pressure    [] Palpitations   [] Shortness of breath when laying flat   [] Shortness of breath with exertion. Vascular:  [x] Pain in legs with walking   [] Pain in legs at rest  [] History of DVT   [] Phlebitis   [] Swelling in legs   [] Varicose veins   [] Non-healing ulcers Pulmonary:   [] Uses home oxygen   [] Productive cough   [] Hemoptysis   [] Wheeze  [x] COPD   [] Asthma Neurologic:  [] Dizziness   [] Seizures   [] History of stroke   [] History of TIA  [] Aphasia   [] Vissual changes   [] Weakness or numbness in arm   [] Weakness or numbness in leg Musculoskeletal:   [] Joint swelling   [x] Joint pain   [] Low back pain Hematologic:  [] Easy bruising  [] Easy bleeding   [] Hypercoagulable state   [] Anemic Gastrointestinal:  [] Diarrhea   [] Vomiting  [x] Gastroesophageal reflux/heartburn   [] Difficulty swallowing. Genitourinary:  [] Chronic kidney disease   [] Difficult urination  [] Frequent urination   [] Blood in urine Skin:  [] Rashes   [] Ulcers  Psychological:  [] History of anxiety   []  History of major depression.  Physical Examination  There were no vitals filed for this visit. There is no height or weight on file to calculate BMI. Gen: WD/WN, NAD Head: [place]/AT, No temporalis wasting.  Ear/Nose/Throat: Hearing grossly intact, nares w/o erythema or drainage Eyes: PER, EOMI, sclera nonicteric.  Neck: Supple, no masses.  No bruit or JVD.  Pulmonary:  Good air movement, no audible wheezing, no use of accessory muscles.  Cardiac: RRR, normal S1, S2, no Murmurs. Vascular:  carotid bruit noted Vessel Right Left  Radial Palpable Trace Palpable  Carotid  Palpable  Palpable  Gastrointestinal: soft, non-distended. No guarding/no peritoneal signs.  Musculoskeletal: M/S 5/5 throughout.  No visible deformity.  Neurologic: CN 2-12 intact. Pain and light touch intact in extremities.  Symmetrical.  Speech is fluent. Motor exam as listed above. Psychiatric: Judgment intact, Mood & affect appropriate for pt's clinical  situation. Dermatologic: No rashes or ulcers noted.  No changes consistent with cellulitis.   CBC Lab Results  Component Value Date   WBC 6.1 06/23/2024   HGB 14.1 06/23/2024   HCT 42.2 06/23/2024   MCV 85.1 06/23/2024   PLT 178 06/23/2024  BMET    Component Value Date/Time   NA 140 06/23/2024 0812   NA 142 02/13/2022 1027   NA 139 03/28/2013 0354   K 3.5 06/23/2024 0812   K 3.7 03/28/2013 1811   CL 109 06/23/2024 0812   CL 107 03/28/2013 0354   CO2 24 06/23/2024 0812   CO2 27 03/28/2013 0354   GLUCOSE 95 06/23/2024 0812   GLUCOSE 112 (H) 03/28/2013 0354   BUN 16 06/23/2024 0812   BUN 15 02/13/2022 1027   BUN 8 03/28/2013 0354   CREATININE 0.57 06/23/2024 0812   CREATININE 0.70 01/21/2024 0945   CREATININE 0.60 08/30/2017 0920   CALCIUM  9.2 06/23/2024 0812   CALCIUM  8.2 (L) 03/28/2013 0354   GFRNONAA >60 06/23/2024 0812   GFRNONAA >60 01/21/2024 0945   GFRNONAA 99 08/30/2017 0920   GFRAA 93 01/25/2021 1048   GFRAA 115 08/30/2017 0920   Estimated Creatinine Clearance: 77.1 mL/min (by C-G formula based on SCr of 0.57 mg/dL).  COAG Lab Results  Component Value Date   INR 1.0 06/20/2022   INR 1.0 07/12/2021   INR 1.0 04/04/2021    Radiology CT CHEST WO CONTRAST Result Date: 06/09/2024 CLINICAL DATA:  Lung cancer, undergoing immunotherapy EXAM: CT CHEST WITHOUT CONTRAST TECHNIQUE: Multidetector CT imaging of the chest was performed following the standard protocol without IV contrast. RADIATION DOSE REDUCTION: This exam was performed according to the departmental dose-optimization program which includes automated exposure control, adjustment of the mA and/or kV according to patient size and/or use of iterative reconstruction technique. COMPARISON:  02/04/2024 FINDINGS: Cardiovascular: Extensive multi-vessel coronary artery calcification. Global cardiac size within normal limits. No pericardial effusion. Central pulmonary arteries are of normal caliber. Extensive  atherosclerotic calcification within the thoracic aorta with almost certain hemodynamically significant stenosis of the left subclavian artery to repeat origin, not well assessed on this noncontrast examination. No aortic aneurysm. Right internal jugular chest port tip seen at the superior cavoatrial junction. Mediastinum/Nodes: No enlarged mediastinal or axillary lymph nodes. Thyroid  gland, trachea, and esophagus demonstrate no significant findings. Lungs/Pleura: Moderate emphysema. Stable 12 mm atypical pulmonary cyst demonstrating ground-glass components within the right lower lobe (91/3, 9 6/3). Slight interval progression of bilateral perihilar and upper lung zone subtle ground-glass peribronchial pulmonary infiltrate which may reflect changes infectious or inflammatory bronchiolitis, smoking related lung disease or hypersensitivity pneumonitis. No pneumothorax or pleural effusion. No central obstructing lesion. Upper Abdomen: No acute abnormality. Musculoskeletal: No acute bone abnormality. No lytic or blastic bone lesion. Osseous structures are age appropriate. IMPRESSION: 1. Slight interval progression of bilateral perihilar and upper lung zone subtle ground-glass peribronchial pulmonary infiltrate which may reflect changes infectious or inflammatory bronchiolitis, smoking related lung disease or hypersensitivity pneumonitis. 2. Stable 12 mm atypical pulmonary cysts demonstrating ground-glass components within the right lower lobe. Continued follow-up is warranted to exclude indolent adenocarcinoma. 3. Moderate emphysema. 4. Extensive multi-vessel coronary artery calcification. 5. Extensive atherosclerotic calcification within the thoracic aorta with almost certain hemodynamically significant stenosis of the left subclavian artery to repeat origin, not well assessed on this noncontrast examination. There is clinical evidence of vertebrobasilar or left upper extremity arterial insufficiency, CT arteriography  may be helpful for further evaluation. Aortic Atherosclerosis (ICD10-I70.0) and Emphysema (ICD10-J43.9). Electronically Signed   By: Dorethia Molt M.D.   On: 06/09/2024 20:25     Assessment/Plan 1. Carotid stenosis, asymptomatic, right (Primary) Recommend:   The patient is s/p successful right CEA   Duplex ultrasound  shows 1-39%  stenosis.   Continue antiplatelet  therapy as prescribed Continue management of CAD, HTN and Hyperlipidemia Healthy heart diet,  encouraged exercise at least 4 times per week   The patient's NIHSS score is as follows: 1 Mild: 1 - 5 Mild to Moderately Severe: 5 - 14 Severe: 15 - 24 Very Severe: >25   Follow up in 6 months with duplex ultrasound and physical exam based on the patient's carotid surgery and <50% stenosis of the left carotid artery  - VAS US  CAROTID; Future  2. Subclavian arterial stenosis (HCC) Recommend:  Given the patient's asymptomatic subclavian stenosis no further invasive testing or surgery at this time.  She recently had a CT scan of the chest with contrast dated February 04, 2024 shows moderate to severe atherosclerotic changes at the origin of the left subclavian.  A CT scan of the chest without contrast dated 06/09/2024 is reported as extensive atherosclerotic calcification within the thoracic aorta with almost certainly hemodynamically significant stenosis of the left subclavian artery at the origin, not well assessed on this noncontrast exam.   Continue antiplatelet therapy as prescribed Continue management of CAD, HTN and Hyperlipidemia Healthy heart diet,  encouraged exercise at least 4 times per week  Follow up in 6 months with duplex ultrasound and physical exam   3. Infrarenal abdominal aortic aneurysm (AAA) without rupture (HCC) Recommend: No surgery or intervention is indicated at this time.   The patient has an asymptomatic abdominal aortic aneurysm that is less than 4 cm in maximal diameter.     I have reviewed the  natural history of abdominal aortic aneurysm and the small risk of rupture for aneurysm less than 5 cm in size.   However, as these small aneurysms tend to enlarge over time, continued surveillance with ultrasound or CT scan is mandatory.    I have also discussed optimizing medical management with hypertension and lipid control and the importance of abstinence from tobacco.  The patient is also encouraged to exercise a minimum of 30 minutes 4 times a week.    Should the patient develop new onset abdominal or back pain or signs of peripheral embolization they are instructed to seek medical attention immediately and to alert the physician providing care that they have an aneurysm.    The patient voices their understanding.   The patient will return in 12 months with an aortic duplex.  4. Atherosclerosis of native artery of both lower extremities with intermittent claudication (HCC) Recommend:   The patient has evidence of atherosclerosis of the lower extremities with claudication.  The patient does not voice lifestyle limiting changes at this point in time.   Noninvasive studies do not suggest clinically significant change.   No invasive studies, angiography or surgery at this time The patient should continue walking and begin a more formal exercise program.  The patient should continue antiplatelet therapy and aggressive treatment of the lipid abnormalities   No changes in the patient's medications at this time   Continued surveillance is indicated as atherosclerosis is likely to progress with time.     The patient will continue follow up with noninvasive studies as ordered.     5. CAD in native artery Continue cardiac and antihypertensive medications as already ordered and reviewed, no changes at this time.  Continue statin as ordered and reviewed, no changes at this time  Nitrates PRN for chest pain  6. Primary hypertension Continue antihypertensive medications as already  ordered, these medications have been reviewed and there are no changes at this time.  Cordella Shawl, MD  06/25/2024 7:42 AM

## 2024-06-26 ENCOUNTER — Other Ambulatory Visit (INDEPENDENT_AMBULATORY_CARE_PROVIDER_SITE_OTHER): Payer: Self-pay | Admitting: Vascular Surgery

## 2024-06-26 DIAGNOSIS — I6523 Occlusion and stenosis of bilateral carotid arteries: Secondary | ICD-10-CM

## 2024-06-30 ENCOUNTER — Encounter (INDEPENDENT_AMBULATORY_CARE_PROVIDER_SITE_OTHER): Payer: Self-pay | Admitting: Vascular Surgery

## 2024-06-30 ENCOUNTER — Ambulatory Visit (INDEPENDENT_AMBULATORY_CARE_PROVIDER_SITE_OTHER)

## 2024-06-30 ENCOUNTER — Ambulatory Visit (INDEPENDENT_AMBULATORY_CARE_PROVIDER_SITE_OTHER): Admitting: Vascular Surgery

## 2024-06-30 VITALS — BP 108/74 | HR 71 | Resp 16 | Ht 68.0 in | Wt 179.6 lb

## 2024-06-30 DIAGNOSIS — I10 Essential (primary) hypertension: Secondary | ICD-10-CM | POA: Diagnosis not present

## 2024-06-30 DIAGNOSIS — I771 Stricture of artery: Secondary | ICD-10-CM | POA: Insufficient documentation

## 2024-06-30 DIAGNOSIS — I70213 Atherosclerosis of native arteries of extremities with intermittent claudication, bilateral legs: Secondary | ICD-10-CM

## 2024-06-30 DIAGNOSIS — I6521 Occlusion and stenosis of right carotid artery: Secondary | ICD-10-CM

## 2024-06-30 DIAGNOSIS — I2510 Atherosclerotic heart disease of native coronary artery without angina pectoris: Secondary | ICD-10-CM

## 2024-06-30 DIAGNOSIS — I7143 Infrarenal abdominal aortic aneurysm, without rupture: Secondary | ICD-10-CM | POA: Diagnosis not present

## 2024-06-30 DIAGNOSIS — I6523 Occlusion and stenosis of bilateral carotid arteries: Secondary | ICD-10-CM | POA: Diagnosis not present

## 2024-07-01 ENCOUNTER — Other Ambulatory Visit: Payer: Self-pay

## 2024-07-15 ENCOUNTER — Encounter: Payer: Self-pay | Admitting: Internal Medicine

## 2024-07-15 ENCOUNTER — Inpatient Hospital Stay (HOSPITAL_BASED_OUTPATIENT_CLINIC_OR_DEPARTMENT_OTHER): Admitting: Internal Medicine

## 2024-07-15 ENCOUNTER — Inpatient Hospital Stay: Attending: Internal Medicine

## 2024-07-15 ENCOUNTER — Inpatient Hospital Stay

## 2024-07-15 VITALS — BP 104/81 | HR 71 | Temp 96.8°F | Resp 20 | Ht 68.0 in | Wt 180.3 lb

## 2024-07-15 VITALS — BP 97/60 | HR 65

## 2024-07-15 DIAGNOSIS — F17210 Nicotine dependence, cigarettes, uncomplicated: Secondary | ICD-10-CM | POA: Diagnosis not present

## 2024-07-15 DIAGNOSIS — Z5112 Encounter for antineoplastic immunotherapy: Secondary | ICD-10-CM | POA: Insufficient documentation

## 2024-07-15 DIAGNOSIS — Z79899 Other long term (current) drug therapy: Secondary | ICD-10-CM | POA: Diagnosis not present

## 2024-07-15 DIAGNOSIS — C7972 Secondary malignant neoplasm of left adrenal gland: Secondary | ICD-10-CM | POA: Diagnosis not present

## 2024-07-15 DIAGNOSIS — C3432 Malignant neoplasm of lower lobe, left bronchus or lung: Secondary | ICD-10-CM

## 2024-07-15 LAB — CBC WITH DIFFERENTIAL/PLATELET
Abs Immature Granulocytes: 0.02 K/uL (ref 0.00–0.07)
Basophils Absolute: 0.1 K/uL (ref 0.0–0.1)
Basophils Relative: 1 %
Eosinophils Absolute: 0.3 K/uL (ref 0.0–0.5)
Eosinophils Relative: 3 %
HCT: 43.1 % (ref 36.0–46.0)
Hemoglobin: 14.5 g/dL (ref 12.0–15.0)
Immature Granulocytes: 0 %
Lymphocytes Relative: 34 %
Lymphs Abs: 3 K/uL (ref 0.7–4.0)
MCH: 28.7 pg (ref 26.0–34.0)
MCHC: 33.6 g/dL (ref 30.0–36.0)
MCV: 85.2 fL (ref 80.0–100.0)
Monocytes Absolute: 0.5 K/uL (ref 0.1–1.0)
Monocytes Relative: 5 %
Neutro Abs: 5.2 K/uL (ref 1.7–7.7)
Neutrophils Relative %: 57 %
Platelets: 202 K/uL (ref 150–400)
RBC: 5.06 MIL/uL (ref 3.87–5.11)
RDW: 13.6 % (ref 11.5–15.5)
WBC: 9 K/uL (ref 4.0–10.5)
nRBC: 0 % (ref 0.0–0.2)

## 2024-07-15 LAB — COMPREHENSIVE METABOLIC PANEL WITH GFR
ALT: 14 U/L (ref 0–44)
AST: 23 U/L (ref 15–41)
Albumin: 3.6 g/dL (ref 3.5–5.0)
Alkaline Phosphatase: 88 U/L (ref 38–126)
Anion gap: 8 (ref 5–15)
BUN: 14 mg/dL (ref 8–23)
CO2: 24 mmol/L (ref 22–32)
Calcium: 9.6 mg/dL (ref 8.9–10.3)
Chloride: 106 mmol/L (ref 98–111)
Creatinine, Ser: 0.59 mg/dL (ref 0.44–1.00)
GFR, Estimated: >60 mL/min (ref >60)
Glucose, Bld: 93 mg/dL (ref 70–99)
Potassium: 3.8 mmol/L (ref 3.5–5.1)
Sodium: 138 mmol/L (ref 135–145)
Total Bilirubin: 0.7 mg/dL (ref 0.0–1.2)
Total Protein: 6.4 g/dL — ABNORMAL LOW (ref 6.5–8.1)

## 2024-07-15 MED ORDER — SODIUM CHLORIDE 0.9 % IV SOLN
Freq: Once | INTRAVENOUS | Status: AC
Start: 2024-07-15 — End: 2024-07-15
  Filled 2024-07-15: qty 250

## 2024-07-15 MED ORDER — SODIUM CHLORIDE 0.9 % IV SOLN
200.0000 mg | Freq: Once | INTRAVENOUS | Status: AC
  Administered 2024-07-15 (×2): 200 mg via INTRAVENOUS
  Filled 2024-07-15: qty 8

## 2024-07-15 NOTE — Assessment & Plan Note (Signed)
#  JUNE 2023-STAGE IV--recurrent/metastatic disease with New left adrenal metastasis;  PET scan JUNE 20th- 2023-  Enlarged 4 cm hypermetabolic LEFT adrenal metastasis;  Suspicion of metastatic adenopathy to the LEFT hilum.  Part solid nodule in the RIGHT lower lobe without metabolic activity. 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 Currently on single agent Keytruda  [PD-L1 greater than 80%].   JULY 7th, 2025- . Slight interval progression of bilateral perihilar and upper lung zone subtle ground-glass peribronchial pulmonary infiltrate which may reflect changes infectious or inflammatory bronchiolitis, smoking related lung disease or hypersensitivity pneumonitis.  Stable 12 mm atypical pulmonary cysts demonstrating ground-glass components within the right lower lobe. Continued follow-up is warranted to exclude indolent adenocarcinoma.  Will continue to follow.   # Continue with keytruda  immunotherapy [? until aug 2025 x2 years]-proceed with treatment today.  # Nausea/vomiting-   resolved s/p compazine . Likely unrelated to keytruda -   improved on lower dose of  Ozempic -Stable.  # Mild Hypokalemia: discussed dietary supplement.Stable.    # Chronic pain: headaches/cervical pain/back pain [Dr.Naveira; pain doctor]  On NSAIDs [ monitor for now. OCT 2023- MRI brain- NEGATIVE for any metastatic disease.   Stable.  FEB 2024- MRI lumbar spine- degenerative disease- on ibuprofen prn- BID. Stable.    # Left rib pain-Post throacotomy pain/tingling and numbness: stable [ DISContinue gabapentin ].   stable  # CAD/ PVD- cramping- s/p evaluation [Dr.Schneir]; ? Carotid occlusion- s/p CEA surgery- CT JULY 2025  Extensive atherosclerotic calcification within the thoracic aorta with almost certain hemodynamically significant stenosis of the left subclavian artery to repeat origin-s/p evaluation with  Dr.  Jama.  Stable.   # COPD-stable encouraged continue to avoid smoking; again counseled to quit smoking; s/p pulmonary-   # vit D def [April 2025-PCP - 29- recommend vitD Supp BID- OTC; will check in 6 weeks- if not improved then ergo weekly- stable  # ACP: [03/31/2024] -Full code.  # IV Access :s/p  port placement stable  *AM appts- Monday-   PS # DISPOSITION:  #  keytruda  today # follow up in 3 weeks- -MD labs/port- cbc/cmp; possible Keytruda ;  # follow up in 6 weeks- -MD labs/port- cbc/cmp;  ;possible Keytruda ;Dr. KATHEE

## 2024-07-15 NOTE — Patient Instructions (Signed)
 [facility] - [facility] DE[facility]  Discharge Instructions: Thank you for choosing [facility] to provide your oncology and hematology care.  If you have a lab appointment with the Cancer Center, please go directly to the Cancer Center and check in at the registration area.  Wear comfortable clothing and clothing appropriate for easy access to any Portacath or PICC line.   We strive to give you quality time with your provider. You may need to reschedule your appointment if you arrive late (15 or more minutes).  Arriving late affects you and other patients whose appointments are after yours.  Also, if you miss three or more appointments without notifying the office, you may be dismissed from the clinic at the provider's discretion.      For prescription refill requests, have your pharmacy contact our office and allow 72 hours for refills to be completed.    Today you received the following chemotherapy and/or immunotherapy agents Laura Nielsen      To help prevent nausea and vomiting after your treatment, we encourage you to take your nausea medication as directed.  BELOW ARE SYMPTOMS THAT SHOULD BE REPORTED IMMEDIATELY: *FEVER GREATER THAN 100.4 F (38 C) OR HIGHER *CHILLS OR SWEATING *NAUSEA AND VOMITING THAT IS NOT CONTROLLED WITH YOUR NAUSEA MEDICATION *UNUSUAL SHORTNESS OF BREATH *UNUSUAL BRUISING OR BLEEDING *URINARY PROBLEMS (pain or burning when urinating, or frequent urination) *BOWEL PROBLEMS (unusual diarrhea, constipation, pain near the anus) TENDERNESS IN MOUTH AND THROAT WITH OR WITHOUT PRESENCE OF ULCERS (sore throat, sores in mouth, or a toothache) UNUSUAL RASH, SWELLING OR PAIN  UNUSUAL VAGINAL DISCHARGE OR ITCHING   Items with * indicate a potential emergency and should be followed up as soon as possible or go to the Emergency Department if any problems should occur.  Please show the CHEMOTHERAPY ALERT CARD or IMMUNOTHERAPY  ALERT CARD at check-in to the Emergency Department and triage nurse.  Should you have questions after your visit or need to cancel or reschedule your appointment, please contact [facility] - A DEPT OF [facility]  [phone] and follow the prompts.  Office hours are 8:00 a.m. to 4:30 p.m. Monday - Friday. Please note that voicemails left after 4:00 p.m. may not be returned until the following business day.  We are closed weekends and major holidays. You have access to a nurse at all times for urgent questions. Please call the main number to the clinic [phone] and follow the prompts.  For any non-urgent questions, you may also contact your provider using MyChart. We now offer e-Visits for anyone 48 and older to request care online for non-urgent symptoms. For details visit [email or web address].   Also download the MyChart app! Go to the app store, search "MyChart", open the app, select [facility] [place], and log in with your MyChart username and password.

## 2024-07-15 NOTE — Progress Notes (Signed)
  Cancer Center CONSULT NOTE  Patient Care Team: Laura Nancyann BRAVO, MD as PCP - General (Family Medicine) Schnier, Cordella MATSU, MD (Vascular Surgery) Janalyn Keene NOVAK, MD (Inactive) as Consulting Physician (Gastroenterology) Verdene Gills, RN as Oncology Nurse Navigator Rennie Cindy SAUNDERS, MD as Consulting Physician (Oncology) Isadora Hose, MD as Consulting Physician (Pulmonary Disease) Ammon Blunt, MD as Consulting Physician (Cardiology) Enola Feliciano Hugger, MD as Consulting Physician (Ophthalmology)   CHIEF COMPLAINTS/PURPOSE OF CONSULTATION: lung cancer  #  Oncology History Overview Note  #MAY 2022- LLL nodule 2.3 cm Adenocarcinoma Stage IA; s/p lobectomy.  No adjuvant therapy  # CT scan 4th June 2023-highly concerning for recurrent/metastatic disease with-. New left adrenal metastasis;  Ground-glass and part solid nodules in the peripheral right lower lobe, unchanged from 11/07/2021 but slightly enlarged from 01/01/2018. Findings are suspicious for indolent adenocarcinoma.  F One- PFL1 =80%; KRAS G12C PET scan JUNE 20th- 2023-  Enlarged 4 cm hypermetabolic LEFT adrenal metastasis;  Suspicion of metastatic adenopathy to the LEFT hilum.  Part solid nodule in the RIGHT lower lobe without metabolic activity.   3 ADRENAL BIOPSY- CK7 POSITIVE ADENOCARCINOMA- There is limited tissue remaining for ancillary testing, not likely  sufficient for NGS testing.   # AUG 7th, 2023-single agent Keytruda .    Right mildly complex cystic adnexal mass noted-June 13th, 2024- Significant interval enlargement of a multiseptated cystic lesion of the right ovary, now measuring 11.7 x 8.2 cm, previously 8.1 x 5.8 cm.- s/p in [facility] [October, 2024]- mucinous cystadenoma - stable.   Primary cancer of left lower lobe of lung (HCC)  05/09/2021 Initial Diagnosis   Primary cancer of left lower lobe of lung (HCC)   07/04/2022 -  Chemotherapy   Patient is on Treatment Plan : LUNG NSCLC  Pembrolizumab  (200) q21d     07/10/2022 - 07/10/2022 Chemotherapy   Patient is on Treatment Plan : LUNG NSCLC Pembrolizumab  (200) q21d      HISTORY OF PRESENTING ILLNESS: Patient  ambulating.  Alone.   Laura Nielsen 68 y.o.  female history of smoking; prior history of lung cancer-with likely recurrence to left adrenal gland [s/p Bx CK-7 positive TTF-1 negative]-clinically suggestive of recurrent lung cancer on single agent Keytruda  is here for follow-up.  Denies any worsening nausea- vomiting. Patient has chronic concerns of consistent headache.  Not any worse.    Denies any abdominal pain. Otherwise patient continues to have mild shortness of breath on exertion. Complains of cramping in the legs chronic.   Review of Systems  Constitutional:  Negative for chills, diaphoresis, fever, malaise/fatigue and weight loss.  HENT:  Negative for nosebleeds and sore throat.   Eyes:  Negative for double vision.  Respiratory:  Negative for cough, hemoptysis, sputum production, shortness of breath and wheezing.   Cardiovascular:  Negative for palpitations, orthopnea and leg swelling.  Gastrointestinal:  Negative for abdominal pain, blood in stool, constipation, diarrhea, heartburn, melena, nausea and vomiting.  Genitourinary:  Negative for dysuria, frequency and urgency.  Musculoskeletal:  Positive for back pain and joint pain.  Skin: Negative.  Negative for itching and rash.  Neurological:  Positive for tingling. Negative for dizziness, focal weakness, weakness and headaches.  Endo/Heme/Allergies:  Does not bruise/bleed easily.  Psychiatric/Behavioral:  Negative for depression. The patient is not nervous/anxious and does not have insomnia.      MEDICAL HISTORY:  Past Medical History:  Diagnosis Date   AAA (abdominal aortic aneurysm) (HCC)    a.) s/p EVAR stent graft repair 03/27/2013  Adnexal mass    Aortic atherosclerosis (HCC)    Arthritis    Asthma    Atherosclerosis of native artery  of both lower extremities with intermittent claudication (HCC)    B12 deficiency    Bilateral carotid artery stenosis    a.) doppler 05/06/2020 and 06/30/2021: 1-39% BICA; b.) doppler 11/20/2022: 60-79% RICA; c.) CTA neck 08/17/2023: >75% RICA, 30% LICA   CAD (coronary artery disease) 03/16/2009   a.) LHC/PCI 03/16/2009: 25% mLCx, 70% mRCA (3 x 18 mm Xience V DES); b.) MV 10/22/2017 and 03/16/2021: no ischemia; c.) MV 07/16/2023: mild apical ischemia   Cancer of left adrenal gland (HCC) 06/20/2022   a.) CNB 06/20/2022 -> pathology (+) for CK7 (TTF-1 -) adenocarcinoma   Centrilobular emphysema (HCC)    Cervical spinal stenosis    Cervical spondylosis with radiculopathy    Chronic back pain    Colon polyp    Complex ovarian cyst    Complication of anesthesia    a.) prone to experience postoperative hypotension   COPD (chronic obstructive pulmonary disease) (HCC)    DDD (degenerative disc disease), cervical    DDD (degenerative disc disease), thoracolumbar    Depression    Diabetes mellitus type 2, controlled (HCC)    Diastolic dysfunction 06/29/2023   a.) TTE 06/29/2023: EF >55%, no RWMAs, G1DD, triv PR, mild MR/TR   Fatty liver    GERD (gastroesophageal reflux disease)    Gout    History of bilateral cataract extraction    History of kidney stones    Hypercholesteremia    Long term (current) use of aspirin     Long term current use of clopidogrel     Lumbar spinal stenosis    Lumbar spondylosis    Non-small cell lung cancer metastatic to adrenal gland (HCC)    a.) s/p LLL wedge resection 04/06/2021 --> pathology (+) for well differentiated adenocarcinoma (stage 1A) --> no adjuvant Tx; b.) recurrent stage IV adenocarcinoma 05/2022 --> Tx'd with  pembrolizumab    Osteoarthritis    Osteoporosis    Primary hypertension    Seasonal allergies    Sleep apnea    a.) no nocturnal PAP therapy since 2021   Vitamin D  deficiency     SURGICAL HISTORY: Past Surgical History:  Procedure  Laterality Date   ABDOMINAL AORTIC ENDOVASCULAR STENT GRAFT  03/27/2013   Dr. Cordella Shawl   ABDOMINAL HYSTERECTOMY     Menometrorrhagia. Excessive bleeding. Unknown if cervix removed.    BILATERAL SALPINGOOPHORECTOMY  09/27/2023   CATARACT EXTRACTION W/PHACO Left 05/14/2023   Procedure: CATARACT EXTRACTION PHACO AND INTRAOCULAR LENS PLACEMENT (IOC) LEFT DIABETIC  OMIDRIA   19.40  01:34.8;  Surgeon: Enola Feliciano Hugger, MD;  Location: [facility];  Service: Ophthalmology;  Laterality: Left;   CATARACT EXTRACTION W/PHACO Right 06/27/2023   Procedure: CATARACT EXTRACTION PHACO AND INTRAOCULAR LENS PLACEMENT (IOC) RIGHT DIABETIC  6.47  00:42.6;  Surgeon: Enola Feliciano Hugger, MD;  Location: [facility];  Service: Ophthalmology;  Laterality: Right;   COLONOSCOPY WITH PROPOFOL  N/A 01/05/2021   Procedure: COLONOSCOPY WITH PROPOFOL ;  Surgeon: Janalyn Keene NOVAK, MD;  Location: [facility];  Service: Endoscopy;  Laterality: N/A;   CORONARY ANGIOPLASTY WITH STENT PLACEMENT  03/16/2009   Procedure: CORONARY ANGIOPLASTY WITH STENT PLACEMENT; Location: [facility]; Surgeons: Vinie Jude, MD (diagnostic) and Deatrice Cage, MD (interventional)   ENDARTERECTOMY Right 11/09/2023   Procedure: ENDARTERECTOMY CAROTID;  Surgeon: Shawl Cordella MATSU, MD;  Location: [facility] OR[facility];  Service: Vascular;  Laterality: Right;   ESOPHAGOGASTRODUODENOSCOPY (EGD) WITH  PROPOFOL  N/A 01/05/2021   Procedure: ESOPHAGOGASTRODUODENOSCOPY (EGD) WITH PROPOFOL ;  Surgeon: Janalyn Keene NOVAK, MD;  Location: [facility];  Service: Endoscopy;  Laterality: N/A;   EXTRACORPOREAL SHOCK WAVE LITHOTRIPSY Right 1999   INTERCOSTAL NERVE BLOCK Left 04/06/2021   Procedure: INTERCOSTAL NERVE BLOCK;  Surgeon: Kerrin Elspeth BROCKS, MD;  Location: [facility] OR;  Service: Thoracic;  Laterality: Left;   IR IMAGING GUIDED PORT INSERTION  06/20/2022   LUNG LOBECTOMY Left 04/06/2021   robotic left lower lobectomy 04/06/2021 Dr. Kerrin  for Stage 1A adenocarcinoma   NODE DISSECTION Left 04/06/2021   Procedure: NODE DISSECTION;  Surgeon: Kerrin Elspeth BROCKS, MD;  Location[facility] OR;  Service: Thoracic;  Laterality: Left;   TUBAL LIGATION      SOCIAL HISTORY: Social History   Socioeconomic History   Marital status: Divorced    Spouse name: Not on file   Number of children: 2   Years of education: H/S   Highest education level: 12th grade  Occupational History   Occupation: Disabled   Occupation: retired  Tobacco Use   Smoking status: Every Day    Current packs/day: 1.00    Average packs/day: 1 pack/day for 51.6 years (51.6 ttl pk-yrs)    Types: Cigarettes    Start date: 1974   Smokeless tobacco: Never   Tobacco comments:    12/23/19 states she quit for 9 months and then started back.     02/20/24 1/2 ppd  Vaping Use   Vaping status: Never Used  Substance and Sexual Activity   Alcohol  use: Not Currently   Drug use: No   Sexual activity: Not on file  Other Topics Concern   Not on file  Social History Narrative   Hx of smoking; quit prior to lung surgery then started back. Lives in [place] alone. Used to work in [employer], [employer]- Facilities manager. On disability sec to spinal pain.    Social Drivers of Health   Financial Resource Strain: Medium Risk (02/20/2024)   Overall Financial Resource Strain (CARDIA)    Difficulty of Paying Living Expenses: Somewhat hard  Food Insecurity: No Food Insecurity (02/20/2024)   Hunger Vital Sign    Worried About Running Out of Food in the Last Year: Never true    Ran Out of Food in the Last Year: Never true  Transportation Needs: No Transportation Needs (02/20/2024)   PRAPARE - Administrator, Civil Service (Medical): No    Lack of Transportation (Non-Medical): No  Physical Activity: Inactive (02/20/2024)   Exercise Vital Sign    Days of Exercise per Week: 0 days    Minutes of Exercise per Session: 0 min  Stress: Stress Concern Present (02/20/2024)   [employer] of  Occupational Health - Occupational Stress Questionnaire    Feeling of Stress : To some extent  Social Connections: Socially Isolated (02/20/2024)   Social Connection and Isolation Panel    Frequency of Communication with Friends and Family: More than three times a week    Frequency of Social Gatherings with Friends and Family: Once a week    Attends Religious Services: Never    Database administrator or Organizations: No    Attends Banker Meetings: Never    Marital Status: Divorced  Catering manager Violence: Not At Risk (02/20/2024)   Humiliation, Afraid, Rape, and Kick questionnaire    Fear of Current or Ex-Partner: No    Emotionally Abused: No    Physically Abused: No    Sexually Abused: No  FAMILY HISTORY: Family History  Problem Relation Age of Onset   Hypertension Mother    Coronary artery disease Mother    Heart attack Mother        acute   Cancer Mother    Alcohol  abuse Father    Depression Father    Hypertension Father    Heart attack Father 73       acute   Alcohol  abuse Sister    Hyperlipidemia Sister    Hypertension Sister    Cancer Sister 25   Heart attack Sister        x's 2   Coronary artery disease Sister 52       x's 2   Breast cancer Sister 17    ALLERGIES:  is allergic to atorvastatin, omeprazole, and bupropion .  MEDICATIONS:  Current Outpatient Medications  Medication Sig Dispense Refill   acetaminophen  (TYLENOL ) 500 MG tablet Take 500 mg by mouth every 6 (six) hours as needed for moderate pain (pain score 4-6).     albuterol  (VENTOLIN  HFA) 108 (90 Base) MCG/ACT inhaler Inhale 2 puffs into the lungs every 6 (six) hours as needed for wheezing or shortness of breath. 8.5 g 2   allopurinol  (ZYLOPRIM ) 100 MG tablet Take 1 tablet (100 mg total) by mouth daily. 90 tablet 4   aspirin  81 MG tablet Take 81 mg by mouth daily.      bisoprolol  (ZEBETA ) 10 MG tablet Take 1 tablet (10 mg total) by mouth daily. 90 tablet 0    Budeson-Glycopyrrol-Formoterol  (BREZTRI  AEROSPHERE) 160-9-4.8 MCG/ACT AERO Inhale 2 puffs into the lungs in the morning and at bedtime. 1284 g 11   clopidogrel  (PLAVIX ) 75 MG tablet Take 1 tablet (75 mg total) by mouth daily. 90 tablet 1   ibuprofen (ADVIL) 200 MG tablet Take 200 mg by mouth 2 (two) times daily as needed (headaches).     lactulose  (CHRONULAC ) 10 GM/15ML solution Take 30 mLs (20 g total) by mouth 2 (two) times daily as needed for mild constipation. 236 mL 0   lidocaine -prilocaine  (EMLA ) cream Apply on the port. 30 -45 min  prior to port access. 30 g 3   lisinopril -hydrochlorothiazide  (ZESTORETIC ) 20-12.5 MG tablet Take 1 tablet by mouth daily. 90 tablet 2   magnesium gluconate (MAGONATE) 500 MG tablet Take 500 mg by mouth at bedtime.     oxyCODONE  (OXY IR/ROXICODONE ) 5 MG immediate release tablet Take 1-2 tablets (5-10 mg total) by mouth every 4 (four) hours as needed for moderate pain (pain score 4-6). 30 tablet 0   pantoprazole  (PROTONIX ) 20 MG tablet Take 1 tablet (20 mg total) by mouth daily. 90 tablet 4   Polyethyl Glyc-Propyl Glyc PF (SYSTANE PRESERVATIVE FREE) 0.4-0.3 % SOLN Place 1 drop into both eyes daily as needed (dry eyes).     prochlorperazine  (COMPAZINE ) 10 MG tablet Take 1 tablet (10 mg total) by mouth every 6 (six) hours as needed for nausea or vomiting. 45 tablet 1   rosuvastatin  (CRESTOR ) 20 MG tablet Take 1 tablet (20 mg total) by mouth daily. 90 tablet 4   Semaglutide ,0.25 or 0.5MG /DOS, (OZEMPIC , 0.25 OR 0.5 MG/DOSE,) 2 MG/1.5ML SOPN Inject 0.5 mg into the skin once a week. 6 mL 1   No current facility-administered medications for this visit.   Facility-Administered Medications Ordered in Other Visits  Medication Dose Route Frequency Provider Last Rate Last Admin   heparin  lock flush 100 UNIT/ML injection            pembrolizumab  (KEYTRUDA ) 200  mg in sodium chloride  0.9 % 50 mL chemo infusion  200 mg Intravenous Once Lizbet Cirrincione R, MD        Mild  maculopapular rash on the Maller area left more than right.  PHYSICAL EXAMINATION: ECOG PERFORMANCE STATUS: 1 - Symptomatic but completely ambulatory  Vitals:   07/15/24 0902  BP: 104/81  Pulse: 71  Resp: 20  Temp: (!) 96.8 F (36 C)  SpO2: 100%        Filed Weights   07/15/24 0902  Weight: 180 lb 4.8 oz (81.8 kg)         Physical Exam HENT:     Head: Normocephalic and atraumatic.     Mouth/Throat:     Pharynx: No oropharyngeal exudate.  Eyes:     Pupils: Pupils are equal, round, and reactive to light.  Cardiovascular:     Rate and Rhythm: Normal rate and regular rhythm.  Pulmonary:     Comments: Decreased breath sounds bilaterally.  No wheeze or crackles Abdominal:     General: Bowel sounds are normal. There is no distension.     Palpations: Abdomen is soft. There is no mass.     Tenderness: There is no abdominal tenderness. There is no guarding or rebound.  Musculoskeletal:        General: No tenderness. Normal range of motion.     Cervical back: Normal range of motion and neck supple.  Skin:    General: Skin is warm.  Neurological:     Mental Status: She is alert and oriented to person, place, and time.  Psychiatric:        Mood and Affect: Affect normal.      LABORATORY DATA:  I have reviewed the data as listed Lab Results  Component Value Date   WBC 9.0 07/15/2024   HGB 14.5 07/15/2024   HCT 43.1 07/15/2024   MCV 85.2 07/15/2024   PLT 202 07/15/2024   Recent Labs    06/02/24 0951 06/23/24 0812 07/15/24 0907  NA 139 140 138  K 3.5 3.5 3.8  CL 106 109 106  CO2 24 24 24   GLUCOSE 94 95 93  BUN 14 16 14   CREATININE 0.59 0.57 0.59  CALCIUM  9.3 9.2 9.6  GFRNONAA >60 >60 >60  PROT 6.7 6.3* 6.4*  ALBUMIN 3.9 3.7 3.6  AST 22 21 23   ALT 14 16 14   ALKPHOS 95 85 88  BILITOT 0.9 0.7 0.7    RADIOGRAPHIC STUDIES: I have personally reviewed the radiological images as listed and agreed with the findings in the report. VAS US   CAROTID Result Date: 06/30/2024 Carotid Arterial Duplex Study Patient Name:  ANAHITA CUA [facility]  Date of Exam:   06/30/2024 Medical Rec #: [id number]            Accession #:    [id number] Date of Birth: August 03, 1956            Patient Gender: F Patient Age:   68 years Exam Location:  [facility] Procedure:      VAS US  CAROTID Referring Phys: CORDELLA SHAWL --------------------------------------------------------------------------------  Indications:       Carotid artery disease. Risk Factors:      Hypertension, current smoker, coronary artery disease. Other Factors:     11/09/2023: Rt CEA. Comparison Study:  12/10/2023 Performing Technologist: Leafy Gibes RVS  Examination Guidelines: A complete evaluation includes B-mode imaging, spectral Doppler, color Doppler, and power Doppler as needed of all accessible portions of each vessel. Bilateral testing  is considered an integral part of a complete examination. Limited examinations for reoccurring indications may be performed as noted.  Right Carotid Findings: +----------+--------+--------+--------+------------------+--------+           PSV cm/sEDV cm/sStenosisPlaque DescriptionComments +----------+--------+--------+--------+------------------+--------+ CCA Prox  65      13                                         +----------+--------+--------+--------+------------------+--------+ CCA Mid   63      18                                         +----------+--------+--------+--------+------------------+--------+ CCA Distal83      24                                         +----------+--------+--------+--------+------------------+--------+ ICA Prox  75      21                                         +----------+--------+--------+--------+------------------+--------+ ICA Mid   86      29                                         +----------+--------+--------+--------+------------------+--------+ ICA Distal109     29                                          +----------+--------+--------+--------+------------------+--------+ ECA       118     16                                         +----------+--------+--------+--------+------------------+--------+ +----------+--------+-------+--------+-------------------+           PSV cm/sEDV cmsDescribeArm Pressure (mmHG) +----------+--------+-------+--------+-------------------+ [id number]      0                                  +----------+--------+-------+--------+-------------------+ +---------+--------+--+--------+--+ VertebralPSV cm/s65EDV cm/s17 +---------+--------+--+--------+--+  Left Carotid Findings: +---------+--------+-------+--------+---------------------------------+--------+          PSV cm/sEDV    StenosisPlaque Description               Comments                  cm/s                                                     +---------+--------+-------+--------+---------------------------------+--------+ CCA Prox 81      20                                                       +---------+--------+-------+--------+---------------------------------+--------+  CCA Mid  77      20                                                       +---------+--------+-------+--------+---------------------------------+--------+ CCA      86      22                                                       Distal                                                                    +---------+--------+-------+--------+---------------------------------+--------+ ICA Prox 52      16             heterogenous and calcific                 +---------+--------+-------+--------+---------------------------------+--------+ ICA Mid  59      20                                                       +---------+--------+-------+--------+---------------------------------+--------+ ICA      80      25                                                        Distal                                                                    +---------+--------+-------+--------+---------------------------------+--------+ ECA      76      8              heterogenous, irregular and                                               calcific                                  +---------+--------+-------+--------+---------------------------------+--------+ +----------+--------+--------+--------+-------------------+           PSV cm/sEDV cm/sDescribeArm Pressure (mmHG) +----------+--------+--------+--------+-------------------+ [id number]      0                                   +----------+--------+--------+--------+-------------------+ +---------+--------+--+--------+--+  VertebralPSV cm/s37EDV cm/s15 +---------+--------+--+--------+--+   Summary: Right Carotid: Velocities in the right ICA are consistent with a 1-39% stenosis. Left Carotid: Velocities in the left ICA are consistent with a 1-39% stenosis. Vertebrals:  Bilateral vertebral arteries demonstrate antegrade flow. Subclavians: Normal flow hemodynamics were seen in bilateral subclavian              arteries. *See table(s) above for measurements and observations.  Electronically signed by Cordella Shawl MD on 06/30/2024 at 5:37:57 PM.    Final        ASSESSMENT & PLAN:   Primary cancer of left lower lobe of lung (HCC) #JUNE 2023-STAGE IV--recurrent/metastatic disease with New left adrenal metastasis;  PET scan JUNE 20th- 2023-  Enlarged 4 cm hypermetabolic LEFT adrenal metastasis;  Suspicion of metastatic adenopathy to the LEFT hilum.  Part solid nodule in the RIGHT lower lobe without metabolic activity. 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 Currently on single agent Keytruda  [PD-L1 greater than 80%].   JULY 7th, 2025- . Slight interval progression of  bilateral perihilar and upper lung zone subtle ground-glass peribronchial pulmonary infiltrate which may reflect changes infectious or inflammatory bronchiolitis, smoking related lung disease or hypersensitivity pneumonitis.  Stable 12 mm atypical pulmonary cysts demonstrating ground-glass components within the right lower lobe. Continued follow-up is warranted to exclude indolent adenocarcinoma.  Will continue to follow.   # Continue with keytruda  immunotherapy [? until aug 2025 x2 years]-proceed with treatment today.  # Nausea/vomiting-   resolved s/p compazine . Likely unrelated to keytruda -   improved on lower dose of  Ozempic -Stable.  # Mild Hypokalemia: discussed dietary supplement.Stable.    # Chronic pain: headaches/cervical pain/back pain [Dr.Naveira; pain doctor]  On NSAIDs [ monitor for now. OCT 2023- MRI brain- NEGATIVE for any metastatic disease.   Stable.  FEB 2024- MRI lumbar spine- degenerative disease- on ibuprofen prn- BID. Stable.    # Left rib pain-Post throacotomy pain/tingling and numbness: stable [ DISContinue gabapentin ].   stable  # CAD/ PVD- cramping- s/p evaluation [Dr.Schneir]; ? Carotid occlusion- s/p CEA surgery- CT JULY 2025  Extensive atherosclerotic calcification within the thoracic aorta with almost certain hemodynamically significant stenosis of the left subclavian artery to repeat origin-s/p evaluation with  Dr. Shawl.  Stable.   # COPD-stable encouraged continue to avoid smoking; again counseled to quit smoking; s/p pulmonary-   # vit D def [April 2025-PCP - 29- recommend vitD Supp BID- OTC; will check in 6 weeks- if not improved then ergo weekly- stable  # ACP: [03/31/2024] -Full code.  # IV Access :s/p  port placement stable  *AM appts- Monday-   PS # DISPOSITION:  #  keytruda  today # follow up in 3 weeks- -MD labs/port- cbc/cmp; possible Keytruda ;  # follow up in 6 weeks- -MD labs/port- cbc/cmp;  ;possible Keytruda ;Dr. KATHEE Cindy JONELLE Rennie, MD 07/15/2024 10:10 AM

## 2024-07-15 NOTE — Progress Notes (Signed)
 Patient has no concerns

## 2024-07-21 ENCOUNTER — Other Ambulatory Visit: Payer: Self-pay | Admitting: Family Medicine

## 2024-07-21 DIAGNOSIS — I10 Essential (primary) hypertension: Secondary | ICD-10-CM

## 2024-08-05 ENCOUNTER — Inpatient Hospital Stay

## 2024-08-05 ENCOUNTER — Encounter: Payer: Self-pay | Admitting: Internal Medicine

## 2024-08-05 ENCOUNTER — Inpatient Hospital Stay (HOSPITAL_BASED_OUTPATIENT_CLINIC_OR_DEPARTMENT_OTHER): Admitting: Internal Medicine

## 2024-08-05 ENCOUNTER — Inpatient Hospital Stay: Attending: Internal Medicine

## 2024-08-05 VITALS — BP 115/64 | HR 58

## 2024-08-05 VITALS — BP 96/51 | HR 66 | Temp 96.4°F | Resp 12 | Ht 68.0 in | Wt 179.8 lb

## 2024-08-05 DIAGNOSIS — Z5112 Encounter for antineoplastic immunotherapy: Secondary | ICD-10-CM | POA: Diagnosis not present

## 2024-08-05 DIAGNOSIS — C3432 Malignant neoplasm of lower lobe, left bronchus or lung: Secondary | ICD-10-CM

## 2024-08-05 DIAGNOSIS — Z79620 Long term (current) use of immunosuppressive biologic: Secondary | ICD-10-CM | POA: Diagnosis not present

## 2024-08-05 DIAGNOSIS — Z79899 Other long term (current) drug therapy: Secondary | ICD-10-CM | POA: Diagnosis not present

## 2024-08-05 DIAGNOSIS — C7972 Secondary malignant neoplasm of left adrenal gland: Secondary | ICD-10-CM | POA: Diagnosis not present

## 2024-08-05 LAB — CBC WITH DIFFERENTIAL/PLATELET
Abs Immature Granulocytes: 0.02 K/uL (ref 0.00–0.07)
Basophils Absolute: 0 K/uL (ref 0.0–0.1)
Basophils Relative: 1 %
Eosinophils Absolute: 0.3 K/uL (ref 0.0–0.5)
Eosinophils Relative: 4 %
HCT: 44.6 % (ref 36.0–46.0)
Hemoglobin: 15 g/dL (ref 12.0–15.0)
Immature Granulocytes: 0 %
Lymphocytes Relative: 39 %
Lymphs Abs: 3 K/uL (ref 0.7–4.0)
MCH: 28.4 pg (ref 26.0–34.0)
MCHC: 33.6 g/dL (ref 30.0–36.0)
MCV: 84.5 fL (ref 80.0–100.0)
Monocytes Absolute: 0.5 K/uL (ref 0.1–1.0)
Monocytes Relative: 7 %
Neutro Abs: 3.9 K/uL (ref 1.7–7.7)
Neutrophils Relative %: 49 %
Platelets: 203 K/uL (ref 150–400)
RBC: 5.28 MIL/uL — ABNORMAL HIGH (ref 3.87–5.11)
RDW: 13.6 % (ref 11.5–15.5)
WBC: 7.8 K/uL (ref 4.0–10.5)
nRBC: 0 % (ref 0.0–0.2)

## 2024-08-05 LAB — COMPREHENSIVE METABOLIC PANEL WITH GFR
ALT: 12 U/L (ref 0–44)
AST: 20 U/L (ref 15–41)
Albumin: 3.8 g/dL (ref 3.5–5.0)
Alkaline Phosphatase: 88 U/L (ref 38–126)
Anion gap: 8 (ref 5–15)
BUN: 15 mg/dL (ref 8–23)
CO2: 23 mmol/L (ref 22–32)
Calcium: 9.4 mg/dL (ref 8.9–10.3)
Chloride: 106 mmol/L (ref 98–111)
Creatinine, Ser: 0.58 mg/dL (ref 0.44–1.00)
GFR, Estimated: >60 mL/min (ref >60)
Glucose, Bld: 93 mg/dL (ref 70–99)
Potassium: 3.8 mmol/L (ref 3.5–5.1)
Sodium: 137 mmol/L (ref 135–145)
Total Bilirubin: 0.7 mg/dL (ref 0.0–1.2)
Total Protein: 6.7 g/dL (ref 6.5–8.1)

## 2024-08-05 MED ORDER — SODIUM CHLORIDE 0.9 % IV SOLN
Freq: Once | INTRAVENOUS | Status: AC
  Filled 2024-08-05: qty 250

## 2024-08-05 MED ORDER — SODIUM CHLORIDE 0.9 % IV SOLN
200.0000 mg | Freq: Once | INTRAVENOUS | Status: AC
  Administered 2024-08-05: 200 mg via INTRAVENOUS
  Filled 2024-08-05: qty 8

## 2024-08-05 NOTE — Progress Notes (Signed)
 [facility] CONSULT NOTE  Patient Care Team: [name], MD as PCP - General (Family Medicine) Schnier, Cordella MATSU, MD (Vascular Surgery) Janalyn Keene NOVAK, MD (Inactive) as Consulting Physician (Gastroenterology) Verdene Gills, RN as Oncology Nurse Navigator Rennie Cindy SAUNDERS, MD as Consulting Physician (Oncology) Isadora Hose, MD as Consulting Physician (Pulmonary Disease) Ammon Blunt, MD as Consulting Physician (Cardiology) Enola Feliciano Hugger, MD as Consulting Physician (Ophthalmology)   CHIEF COMPLAINTS/PURPOSE OF CONSULTATION: lung cancer  #  Oncology History Overview Note  #MAY 2022- LLL nodule 2.3 cm Adenocarcinoma Stage IA; s/p lobectomy.  No adjuvant therapy  # CT scan 4th June 2023-highly concerning for recurrent/metastatic disease with-. New left adrenal metastasis;  Ground-glass and part solid nodules in the peripheral right lower lobe, unchanged from 11/07/2021 but slightly enlarged from 01/01/2018. Findings are suspicious for indolent adenocarcinoma.  F One- PFL1 =80%; KRAS G12C PET scan JUNE 20th- 2023-  Enlarged 4 cm hypermetabolic LEFT adrenal metastasis;  Suspicion of metastatic adenopathy to the LEFT hilum.  Part solid nodule in the RIGHT lower lobe without metabolic activity.   3 ADRENAL BIOPSY- CK7 POSITIVE ADENOCARCINOMA- There is limited tissue remaining for ancillary testing, not likely  sufficient for NGS testing.   # AUG 7th, 2023-single agent Keytruda .    Right mildly complex cystic adnexal mass noted-June 13th, 2024- Significant interval enlargement of a multiseptated cystic lesion of the right ovary, now measuring 11.7 x 8.2 cm, previously 8.1 x 5.8 cm.- s/p in [facility] [October, 2024]- mucinous cystadenoma - stable.   Primary cancer of left lower lobe of lung (HCC)  05/09/2021 Initial Diagnosis   Primary cancer of left lower lobe of lung (HCC)   07/04/2022 -  Chemotherapy   Patient is on Treatment Plan : LUNG NSCLC  Pembrolizumab  (200) q21d     07/10/2022 - 07/10/2022 Chemotherapy   Patient is on Treatment Plan : LUNG NSCLC Pembrolizumab  (200) q21d      HISTORY OF PRESENTING ILLNESS: Patient  ambulating.  Alone.   Laura Nielsen 68 y.o.  female history of smoking; prior history of lung cancer-with likely recurrence to left adrenal gland [s/p Bx CK-7 positive TTF-1 negative]-clinically suggestive of recurrent lung cancer on single agent Keytruda  is here for follow-up.  Denies any worsening nausea- vomiting. Patient has chronic concerns of consistent headache.  Not any worse.    Denies any abdominal pain. Otherwise patient continues to have mild shortness of breath on exertion. Complains of cramping in the legs chronic.   Review of Systems  Constitutional:  Negative for chills, diaphoresis, fever, malaise/fatigue and weight loss.  HENT:  Negative for nosebleeds and sore throat.   Eyes:  Negative for double vision.  Respiratory:  Negative for cough, hemoptysis, sputum production, shortness of breath and wheezing.   Cardiovascular:  Negative for palpitations, orthopnea and leg swelling.  Gastrointestinal:  Negative for abdominal pain, blood in stool, constipation, diarrhea, heartburn, melena, nausea and vomiting.  Genitourinary:  Negative for dysuria, frequency and urgency.  Musculoskeletal:  Positive for back pain and joint pain.  Skin: Negative.  Negative for itching and rash.  Neurological:  Positive for tingling. Negative for dizziness, focal weakness, weakness and headaches.  Endo/Heme/Allergies:  Does not bruise/bleed easily.  Psychiatric/Behavioral:  Negative for depression. The patient is not nervous/anxious and does not have insomnia.      MEDICAL HISTORY:  Past Medical History:  Diagnosis Date   AAA (abdominal aortic aneurysm) (HCC)    a.) s/p EVAR stent graft repair 03/27/2013  Adnexal mass    Aortic atherosclerosis (HCC)    Arthritis    Asthma    Atherosclerosis of native artery  of both lower extremities with intermittent claudication (HCC)    B12 deficiency    Bilateral carotid artery stenosis    a.) doppler 05/06/2020 and 06/30/2021: 1-39% BICA; b.) doppler 11/20/2022: 60-79% RICA; c.) CTA neck 08/17/2023: >75% RICA, 30% LICA   CAD (coronary artery disease) 03/16/2009   a.) LHC/PCI 03/16/2009: 25% mLCx, 70% mRCA (3 x 18 mm Xience V DES); b.) MV 10/22/2017 and 03/16/2021: no ischemia; c.) MV 07/16/2023: mild apical ischemia   Cancer of left adrenal gland (HCC) 06/20/2022   a.) CNB 06/20/2022 -> pathology (+) for CK7 (TTF-1 -) adenocarcinoma   Centrilobular emphysema (HCC)    Cervical spinal stenosis    Cervical spondylosis with radiculopathy    Chronic back pain    Colon polyp    Complex ovarian cyst    Complication of anesthesia    a.) prone to experience postoperative hypotension   COPD (chronic obstructive pulmonary disease) (HCC)    DDD (degenerative disc disease), cervical    DDD (degenerative disc disease), thoracolumbar    Depression    Diabetes mellitus type 2, controlled (HCC)    Diastolic dysfunction 06/29/2023   a.) TTE 06/29/2023: EF >55%, no RWMAs, G1DD, triv PR, mild MR/TR   Fatty liver    GERD (gastroesophageal reflux disease)    Gout    History of bilateral cataract extraction    History of kidney stones    Hypercholesteremia    Long term (current) use of aspirin     Long term current use of clopidogrel     Lumbar spinal stenosis    Lumbar spondylosis    Non-small cell lung cancer metastatic to adrenal gland (HCC)    a.) s/p LLL wedge resection 04/06/2021 --> pathology (+) for well differentiated adenocarcinoma (stage 1A) --> no adjuvant Tx; b.) recurrent stage IV adenocarcinoma 05/2022 --> Tx'd with  pembrolizumab    Osteoarthritis    Osteoporosis    Primary hypertension    Seasonal allergies    Sleep apnea    a.) no nocturnal PAP therapy since 2021   Vitamin D  deficiency     SURGICAL HISTORY: Past Surgical History:  Procedure  Laterality Date   ABDOMINAL AORTIC ENDOVASCULAR STENT GRAFT  03/27/2013   Dr. Cordella Shawl   ABDOMINAL HYSTERECTOMY     Menometrorrhagia. Excessive bleeding. Unknown if cervix removed.    BILATERAL SALPINGOOPHORECTOMY  09/27/2023   CATARACT EXTRACTION W/PHACO Left 05/14/2023   Procedure: CATARACT EXTRACTION PHACO AND INTRAOCULAR LENS PLACEMENT (IOC) LEFT DIABETIC  OMIDRIA   19.40  01:34.8;  Surgeon: Enola Feliciano Hugger, MD;  Location: [facility];  Service: Ophthalmology;  Laterality: Left;   CATARACT EXTRACTION W/PHACO Right 06/27/2023   Procedure: CATARACT EXTRACTION PHACO AND INTRAOCULAR LENS PLACEMENT (IOC) RIGHT DIABETIC  6.47  00:42.6;  Surgeon: Enola Feliciano Hugger, MD;  Location: [facility];  Service: Ophthalmology;  Laterality: Right;   COLONOSCOPY WITH PROPOFOL  N/A 01/05/2021   Procedure: COLONOSCOPY WITH PROPOFOL ;  Surgeon: Janalyn Keene NOVAK, MD;  Location: [facility] ENDOSCOPY;  Service: Endoscopy;  Laterality: N/A;   CORONARY ANGIOPLASTY WITH STENT PLACEMENT  03/16/2009   Procedure: CORONARY ANGIOPLASTY WITH STENT PLACEMENT; Location: [facility]; Surgeons: Vinie Jude, MD (diagnostic) and Deatrice Cage, MD (interventional)   ENDARTERECTOMY Right 11/09/2023   Procedure: ENDARTERECTOMY CAROTID;  Surgeon: Shawl Cordella MATSU, MD;  Location: [facility] ORS;  Service: Vascular;  Laterality: Right;   ESOPHAGOGASTRODUODENOSCOPY (EGD) WITH  PROPOFOL  N/A 01/05/2021   Procedure: ESOPHAGOGASTRODUODENOSCOPY (EGD) WITH PROPOFOL ;  Surgeon: Janalyn Keene NOVAK, MD;  Location: [facility] ENDOSCOPY;  Service: Endoscopy;  Laterality: N/A;   EXTRACORPOREAL SHOCK WAVE LITHOTRIPSY Right 1999   INTERCOSTAL NERVE BLOCK Left 04/06/2021   Procedure: INTERCOSTAL NERVE BLOCK;  Surgeon: Kerrin Elspeth BROCKS, MD;  Location: [facility] OR;  Service: Thoracic;  Laterality: Left;   IR IMAGING GUIDED PORT INSERTION  06/20/2022   LUNG LOBECTOMY Left 04/06/2021   robotic left lower lobectomy 04/06/2021 Dr. Kerrin  for Stage 1A adenocarcinoma   NODE DISSECTION Left 04/06/2021   Procedure: NODE DISSECTION;  Surgeon: Kerrin Elspeth BROCKS, MD;  Location: [facility] OR;  Service: Thoracic;  Laterality: Left;   TUBAL LIGATION      SOCIAL HISTORY: Social History   Socioeconomic History   Marital status: Divorced    Spouse name: Not on file   Number of children: 2   Years of education: H/S   Highest education level: 12th grade  Occupational History   Occupation: Disabled   Occupation: retired  Tobacco Use   Smoking status: Every Day    Current packs/day: 1.00    Average packs/day: 1 pack/day for 51.7 years (51.7 ttl pk-yrs)    Types: Cigarettes    Start date: 1974   Smokeless tobacco: Never   Tobacco comments:    12/23/19 states she quit for 9 months and then started back.     02/20/24 1/2 ppd  Vaping Use   Vaping status: Never Used  Substance and Sexual Activity   Alcohol  use: Not Currently   Drug use: No   Sexual activity: Not on file  Other Topics Concern   Not on file  Social History Narrative   Hx of smoking; quit prior to lung surgery then started back. Lives in [place] alone. Used to work in [employer], [employer]- Facilities manager. On disability sec to spinal pain.    Social Drivers of Health   Financial Resource Strain: Medium Risk (02/20/2024)   Overall Financial Resource Strain (CARDIA)    Difficulty of Paying Living Expenses: Somewhat hard  Food Insecurity: No Food Insecurity (02/20/2024)   Hunger Vital Sign    Worried About Running Out of Food in the Last Year: Never true    Ran Out of Food in the Last Year: Never true  Transportation Needs: No Transportation Needs (02/20/2024)   PRAPARE - Administrator, Civil Service (Medical): No    Lack of Transportation (Non-Medical): No  Physical Activity: Inactive (02/20/2024)   Exercise Vital Sign    Days of Exercise per Week: 0 days    Minutes of Exercise per Session: 0 min  Stress: Stress Concern Present (02/20/2024)   [employer] of  Occupational Health - Occupational Stress Questionnaire    Feeling of Stress : To some extent  Social Connections: Socially Isolated (02/20/2024)   Social Connection and Isolation Panel    Frequency of Communication with Friends and Family: More than three times a week    Frequency of Social Gatherings with Friends and Family: Once a week    Attends Religious Services: Never    Database administrator or Organizations: No    Attends Banker Meetings: Never    Marital Status: Divorced  Catering manager Violence: Not At Risk (02/20/2024)   Humiliation, Afraid, Rape, and Kick questionnaire    Fear of Current or Ex-Partner: No    Emotionally Abused: No    Physically Abused: No    Sexually Abused: No  FAMILY HISTORY: Family History  Problem Relation Age of Onset   Hypertension Mother    Coronary artery disease Mother    Heart attack Mother        acute   Cancer Mother    Alcohol  abuse Father    Depression Father    Hypertension Father    Heart attack Father 73       acute   Alcohol  abuse Sister    Hyperlipidemia Sister    Hypertension Sister    Cancer Sister 10   Heart attack Sister        x's 2   Coronary artery disease Sister 75       x's 2   Breast cancer Sister 59    ALLERGIES:  is allergic to atorvastatin, omeprazole, and bupropion .  MEDICATIONS:  Current Outpatient Medications  Medication Sig Dispense Refill   acetaminophen  (TYLENOL ) 500 MG tablet Take 500 mg by mouth every 6 (six) hours as needed for moderate pain (pain score 4-6).     albuterol  (VENTOLIN  HFA) 108 (90 Base) MCG/ACT inhaler Inhale 2 puffs into the lungs every 6 (six) hours as needed for wheezing or shortness of breath. 8.5 g 2   allopurinol  (ZYLOPRIM ) 100 MG tablet Take 1 tablet (100 mg total) by mouth daily. 90 tablet 4   aspirin  81 MG tablet Take 81 mg by mouth daily.      bisoprolol  (ZEBETA ) 10 MG tablet Take 1 tablet (10 mg total) by mouth daily. 90 tablet 0    Budeson-Glycopyrrol-Formoterol  (BREZTRI  AEROSPHERE) 160-9-4.8 MCG/ACT AERO Inhale 2 puffs into the lungs in the morning and at bedtime. 1284 g 11   clopidogrel  (PLAVIX ) 75 MG tablet Take 1 tablet (75 mg total) by mouth daily. 90 tablet 1   ibuprofen (ADVIL) 200 MG tablet Take 200 mg by mouth 2 (two) times daily as needed (headaches).     lactulose  (CHRONULAC ) 10 GM/15ML solution Take 30 mLs (20 g total) by mouth 2 (two) times daily as needed for mild constipation. 236 mL 0   lidocaine -prilocaine  (EMLA ) cream Apply on the port. 30 -45 min  prior to port access. 30 g 3   lisinopril -hydrochlorothiazide  (ZESTORETIC ) 20-12.5 MG tablet Take 1 tablet by mouth daily. 90 tablet 2   magnesium gluconate (MAGONATE) 500 MG tablet Take 500 mg by mouth at bedtime.     oxyCODONE  (OXY IR/ROXICODONE ) 5 MG immediate release tablet Take 1-2 tablets (5-10 mg total) by mouth every 4 (four) hours as needed for moderate pain (pain score 4-6). 30 tablet 0   pantoprazole  (PROTONIX ) 20 MG tablet Take 1 tablet (20 mg total) by mouth daily. 90 tablet 4   Polyethyl Glyc-Propyl Glyc PF (SYSTANE PRESERVATIVE FREE) 0.4-0.3 % SOLN Place 1 drop into both eyes daily as needed (dry eyes).     prochlorperazine  (COMPAZINE ) 10 MG tablet Take 1 tablet (10 mg total) by mouth every 6 (six) hours as needed for nausea or vomiting. 45 tablet 1   rosuvastatin  (CRESTOR ) 20 MG tablet Take 1 tablet (20 mg total) by mouth daily. 90 tablet 4   Semaglutide ,0.25 or 0.5MG /DOS, (OZEMPIC , 0.25 OR 0.5 MG/DOSE,) 2 MG/1.5ML SOPN Inject 0.5 mg into the skin once a week. 6 mL 1   No current facility-administered medications for this visit.   Facility-Administered Medications Ordered in Other Visits  Medication Dose Route Frequency Provider Last Rate Last Admin   heparin  lock flush 100 UNIT/ML injection             Mild maculopapular  rash on the Maller area left more than right.  PHYSICAL EXAMINATION: ECOG PERFORMANCE STATUS: 1 - Symptomatic but  completely ambulatory  Vitals:   08/05/24 0906  BP: (!) 96/51  Pulse: 66  Resp: 12  Temp: (!) 96.4 F (35.8 C)  SpO2: 100%        Filed Weights   08/05/24 0906  Weight: 179 lb 12.8 oz (81.6 kg)         Physical Exam HENT:     Head: Normocephalic and atraumatic.     Mouth/Throat:     Pharynx: No oropharyngeal exudate.  Eyes:     Pupils: Pupils are equal, round, and reactive to light.  Cardiovascular:     Rate and Rhythm: Normal rate and regular rhythm.  Pulmonary:     Comments: Decreased breath sounds bilaterally.  No wheeze or crackles Abdominal:     General: Bowel sounds are normal. There is no distension.     Palpations: Abdomen is soft. There is no mass.     Tenderness: There is no abdominal tenderness. There is no guarding or rebound.  Musculoskeletal:        General: No tenderness. Normal range of motion.     Cervical back: Normal range of motion and neck supple.  Skin:    General: Skin is warm.  Neurological:     Mental Status: She is alert and oriented to person, place, and time.  Psychiatric:        Mood and Affect: Affect normal.      LABORATORY DATA:  I have reviewed the data as listed Lab Results  Component Value Date   WBC 7.8 08/05/2024   HGB 15.0 08/05/2024   HCT 44.6 08/05/2024   MCV 84.5 08/05/2024   PLT 203 08/05/2024   Recent Labs    06/23/24 0812 07/15/24 0907 08/05/24 0908  NA 140 138 137  K 3.5 3.8 3.8  CL 109 106 106  CO2 24 24 23   GLUCOSE 95 93 93  BUN 16 14 15   CREATININE 0.57 0.59 0.58  CALCIUM  9.2 9.6 9.4  GFRNONAA >60 >60 >60  PROT 6.3* 6.4* 6.7  ALBUMIN 3.7 3.6 3.8  AST 21 23 20   ALT 16 14 12   ALKPHOS 85 88 88  BILITOT 0.7 0.7 0.7    RADIOGRAPHIC STUDIES: I have personally reviewed the radiological images as listed and agreed with the findings in the report. No results found.      ASSESSMENT & PLAN:   Primary cancer of left lower lobe of lung (HCC) #JUNE 2023-STAGE IV--recurrent/metastatic  disease with New left adrenal metastasis;  PET scan JUNE 20th- 2023-  Enlarged 4 cm hypermetabolic LEFT adrenal metastasis;  Suspicion of metastatic adenopathy to the LEFT hilum.  Part solid nodule in the RIGHT lower lobe without metabolic activity. 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 Currently on single agent Keytruda  [PD-L1 greater than 80%].   JULY 7th, 2025- . Slight interval progression of bilateral perihilar and upper lung zone subtle ground-glass peribronchial pulmonary infiltrate which may reflect changes infectious or inflammatory bronchiolitis, smoking related lung disease or hypersensitivity pneumonitis.  Stable 12 mm atypical pulmonary cysts demonstrating ground-glass components within the right lower lobe. Continued follow-up is warranted to exclude indolent adenocarcinoma.    # Continue with keytruda  immunotherapy- Proceed with # 32 of planned #33 [? until aug 2025 x2 years]-proceed with treatment today. Will plan CT  scan again in 2 weeks. If negative CT scan- will plan discontinuation of keytruda .   # Nausea/vomiting-   resolved s/p compazine . Likely unrelated to keytruda -   improved on lower dose of  Ozempic -Stable.  # Mild Hypokalemia: discussed dietary supplement.Stable.    # Chronic pain: headaches/cervical pain/back pain [Dr.Naveira; pain doctor]  On NSAIDs [ monitor for now. OCT 2023- MRI brain- NEGATIVE for any metastatic disease.   Stable.  FEB 2024- MRI lumbar spine- degenerative disease- on ibuprofen prn- BID. Stable.    # Left rib pain-Post throacotomy pain/tingling and numbness: stable [ DISContinue gabapentin ].   stable  # CAD/ PVD- cramping- s/p evaluation [Dr.Schneir]; ? Carotid occlusion- s/p CEA surgery- CT JULY 2025  Extensive atherosclerotic calcification within the thoracic aorta with almost certain hemodynamically significant stenosis of the left  subclavian artery to repeat origin-s/p evaluation with  Dr. Jama. Stable.    # COPD-stable encouraged continue to avoid smoking; again counseled to quit smoking; s/p pulmonary-  Stable.   # vit D def [April 2025-PCP - 28- recommend vitD Supp BID- OTC; will check in 6 weeks- if not improved then ergo weekly- stable  # ACP: [03/31/2024] -Full code.  # IV Access :s/p  port placement stable  *AM appts- Monday-   PS; CT with contrast # DISPOSITION:  #  keytruda  today # follow up in 3 weeks- -MD labs/port- cbc/cmp; possible Keytruda - CT scan-Dr. KATHEE Cindy JONELLE Rennie, MD 08/05/2024 10:11 AM

## 2024-08-05 NOTE — Patient Instructions (Signed)
 [facility] - A DEPT OF [facility]  Discharge Instructions: Thank you for choosing [facility] to provide your oncology and hematology care.  If you have a lab appointment with the Cancer Center, please go directly to the Cancer Center and check in at the registration area.  Wear comfortable clothing and clothing appropriate for easy access to any Portacath or PICC line.   We strive to give you quality time with your provider. You may need to reschedule your appointment if you arrive late (15 or more minutes).  Arriving late affects you and other patients whose appointments are after yours.  Also, if you miss three or more appointments without notifying the office, you may be dismissed from the clinic at the provider's discretion.      For prescription refill requests, have your pharmacy contact our office and allow 72 hours for refills to be completed.    Today you received the following chemotherapy and/or immunotherapy agents Rande Lawman      To help prevent nausea and vomiting after your treatment, we encourage you to take your nausea medication as directed.  BELOW ARE SYMPTOMS THAT SHOULD BE REPORTED IMMEDIATELY: *FEVER GREATER THAN 100.4 F (38 C) OR HIGHER *CHILLS OR SWEATING *NAUSEA AND VOMITING THAT IS NOT CONTROLLED WITH YOUR NAUSEA MEDICATION *UNUSUAL SHORTNESS OF BREATH *UNUSUAL BRUISING OR BLEEDING *URINARY PROBLEMS (pain or burning when urinating, or frequent urination) *BOWEL PROBLEMS (unusual diarrhea, constipation, pain near the anus) TENDERNESS IN MOUTH AND THROAT WITH OR WITHOUT PRESENCE OF ULCERS (sore throat, sores in mouth, or a toothache) UNUSUAL RASH, SWELLING OR PAIN  UNUSUAL VAGINAL DISCHARGE OR ITCHING   Items with * indicate a potential emergency and should be followed up as soon as possible or go to the Emergency Department if any problems should occur.  Please show the CHEMOTHERAPY ALERT CARD or IMMUNOTHERAPY  ALERT CARD at check-in to the Emergency Department and triage nurse.  Should you have questions after your visit or need to cancel or reschedule your appointment, please contact [facility] - A DEPT OF [facility]  [phone] and follow the prompts.  Office hours are 8:00 a.m. to 4:30 p.m. Monday - Friday. Please note that voicemails left after 4:00 p.m. may not be returned until the following business day.  We are closed weekends and major holidays. You have access to a nurse at all times for urgent questions. Please call the main number to the clinic [phone] and follow the prompts.  For any non-urgent questions, you may also contact your provider using MyChart. We now offer e-Visits for anyone 48 and older to request care online for non-urgent symptoms. For details visit [email or web address].   Also download the MyChart app! Go to the app store, search "MyChart", open the app, select [facility], and log in with your MyChart username and password.

## 2024-08-05 NOTE — Assessment & Plan Note (Addendum)
#  JUNE 2023-STAGE IV--recurrent/metastatic disease with New left adrenal metastasis;  PET scan JUNE 20th- 2023-  Enlarged 4 cm hypermetabolic LEFT adrenal metastasis;  Suspicion of metastatic adenopathy to the LEFT hilum.  Part solid nodule in the RIGHT lower lobe without metabolic activity. 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 Currently on single agent Keytruda  [PD-L1 greater than 80%].   JULY 7th, 2025- . Slight interval progression of bilateral perihilar and upper lung zone subtle ground-glass peribronchial pulmonary infiltrate which may reflect changes infectious or inflammatory bronchiolitis, smoking related lung disease or hypersensitivity pneumonitis.  Stable 12 mm atypical pulmonary cysts demonstrating ground-glass components within the right lower lobe. Continued follow-up is warranted to exclude indolent adenocarcinoma.    # Continue with keytruda  immunotherapy- Proceed with # 32 of planned #33 [? until aug 2025 x2 years]-proceed with treatment today. Will plan CT scan again in 2 weeks. If negative CT scan- will plan discontinuation of keytruda .   # Nausea/vomiting-   resolved s/p compazine . Likely unrelated to keytruda -   improved on lower dose of  Ozempic -Stable.  # Mild Hypokalemia: discussed dietary supplement.Stable.    # Chronic pain: headaches/cervical pain/back pain [Dr.Naveira; pain doctor]  On NSAIDs [ monitor for now. OCT 2023- MRI brain- NEGATIVE for any metastatic disease.   Stable.  FEB 2024- MRI lumbar spine- degenerative disease- on ibuprofen prn- BID. Stable.    # Left rib pain-Post throacotomy pain/tingling and numbness: stable [ DISContinue gabapentin ].   stable  # CAD/ PVD- cramping- s/p evaluation [Dr.Schneir]; ? Carotid occlusion- s/p CEA surgery- CT JULY 2025  Extensive atherosclerotic calcification within the thoracic aorta with almost certain  hemodynamically significant stenosis of the left subclavian artery to repeat origin-s/p evaluation with  Dr. Jama. Stable.    # COPD-stable encouraged continue to avoid smoking; again counseled to quit smoking; s/p pulmonary-  Stable.   # vit D def [April 2025-PCP - 28- recommend vitD Supp BID- OTC; will check in 6 weeks- if not improved then ergo weekly- stable  # ACP: [03/31/2024] -Full code.  # IV Access :s/p  port placement stable  *AM appts- Monday-   PS; CT with contrast # DISPOSITION:  #  keytruda  today # follow up in 3 weeks- -MD labs/port- cbc/cmp; possible Keytruda - CT scan-Dr. B

## 2024-08-05 NOTE — Progress Notes (Signed)
 No concerns today

## 2024-08-14 ENCOUNTER — Ambulatory Visit
Admission: RE | Admit: 2024-08-14 | Discharge: 2024-08-14 | Disposition: A | Discharge status: Home / Self Care | Source: Ambulatory Visit | Attending: Internal Medicine | Admitting: Internal Medicine

## 2024-08-14 DIAGNOSIS — C3432 Malignant neoplasm of lower lobe, left bronchus or lung: Secondary | ICD-10-CM | POA: Diagnosis not present

## 2024-08-14 DIAGNOSIS — I700 Atherosclerosis of aorta: Secondary | ICD-10-CM | POA: Diagnosis not present

## 2024-08-14 MED ORDER — IOHEXOL 300 MG/ML  SOLN
75.0000 mL | Freq: Once | INTRAMUSCULAR | Status: AC | PRN
  Administered 2024-08-14: 75 mL via INTRAVENOUS

## 2024-08-25 ENCOUNTER — Telehealth: Payer: Self-pay | Admitting: *Deleted

## 2024-08-25 ENCOUNTER — Inpatient Hospital Stay

## 2024-08-25 ENCOUNTER — Encounter: Payer: Self-pay | Admitting: Internal Medicine

## 2024-08-25 ENCOUNTER — Inpatient Hospital Stay (HOSPITAL_BASED_OUTPATIENT_CLINIC_OR_DEPARTMENT_OTHER): Admitting: Internal Medicine

## 2024-08-25 VITALS — BP 136/85 | HR 63 | Temp 95.9°F | Resp 16 | Ht 68.0 in | Wt 179.6 lb

## 2024-08-25 VITALS — BP 127/75 | HR 64 | Temp 95.0°F | Resp 17

## 2024-08-25 DIAGNOSIS — C7972 Secondary malignant neoplasm of left adrenal gland: Secondary | ICD-10-CM | POA: Diagnosis not present

## 2024-08-25 DIAGNOSIS — C3432 Malignant neoplasm of lower lobe, left bronchus or lung: Secondary | ICD-10-CM

## 2024-08-25 DIAGNOSIS — Z5112 Encounter for antineoplastic immunotherapy: Secondary | ICD-10-CM | POA: Diagnosis not present

## 2024-08-25 DIAGNOSIS — Z79620 Long term (current) use of immunosuppressive biologic: Secondary | ICD-10-CM | POA: Diagnosis not present

## 2024-08-25 DIAGNOSIS — Z79899 Other long term (current) drug therapy: Secondary | ICD-10-CM | POA: Diagnosis not present

## 2024-08-25 LAB — CBC WITH DIFFERENTIAL/PLATELET
Abs Immature Granulocytes: 0.01 K/uL (ref 0.00–0.07)
Basophils Absolute: 0.1 K/uL (ref 0.0–0.1)
Basophils Relative: 1 %
Eosinophils Absolute: 0.3 K/uL (ref 0.0–0.5)
Eosinophils Relative: 5 %
HCT: 43.7 % (ref 36.0–46.0)
Hemoglobin: 14.7 g/dL (ref 12.0–15.0)
Immature Granulocytes: 0 %
Lymphocytes Relative: 40 %
Lymphs Abs: 2.7 K/uL (ref 0.7–4.0)
MCH: 28.5 pg (ref 26.0–34.0)
MCHC: 33.6 g/dL (ref 30.0–36.0)
MCV: 84.7 fL (ref 80.0–100.0)
Monocytes Absolute: 0.4 K/uL (ref 0.1–1.0)
Monocytes Relative: 7 %
Neutro Abs: 3.1 K/uL (ref 1.7–7.7)
Neutrophils Relative %: 47 %
Platelets: 185 K/uL (ref 150–400)
RBC: 5.16 MIL/uL — ABNORMAL HIGH (ref 3.87–5.11)
RDW: 13.6 % (ref 11.5–15.5)
WBC: 6.6 K/uL (ref 4.0–10.5)
nRBC: 0 % (ref 0.0–0.2)

## 2024-08-25 LAB — COMPREHENSIVE METABOLIC PANEL WITH GFR
ALT: 13 U/L (ref 0–44)
AST: 20 U/L (ref 15–41)
Albumin: 3.8 g/dL (ref 3.5–5.0)
Alkaline Phosphatase: 90 U/L (ref 38–126)
Anion gap: 8 (ref 5–15)
BUN: 19 mg/dL (ref 8–23)
CO2: 24 mmol/L (ref 22–32)
Calcium: 9.3 mg/dL (ref 8.9–10.3)
Chloride: 108 mmol/L (ref 98–111)
Creatinine, Ser: 0.65 mg/dL (ref 0.44–1.00)
GFR, Estimated: >60 mL/min (ref >60)
Glucose, Bld: 98 mg/dL (ref 70–99)
Potassium: 3.7 mmol/L (ref 3.5–5.1)
Sodium: 140 mmol/L (ref 135–145)
Total Bilirubin: 0.9 mg/dL (ref 0.0–1.2)
Total Protein: 6.8 g/dL (ref 6.5–8.1)

## 2024-08-25 LAB — TSH: TSH: 3.379 u[IU]/mL (ref 0.350–4.500)

## 2024-08-25 MED ORDER — SODIUM CHLORIDE 0.9 % IV SOLN
Freq: Once | INTRAVENOUS | Status: AC
  Filled 2024-08-25: qty 250

## 2024-08-25 MED ORDER — SODIUM CHLORIDE 0.9 % IV SOLN
200.0000 mg | Freq: Once | INTRAVENOUS | Status: AC
  Administered 2024-08-25: 200 mg via INTRAVENOUS
  Filled 2024-08-25: qty 8

## 2024-08-25 NOTE — Assessment & Plan Note (Addendum)
#  JUNE 2023-STAGE IV--recurrent/metastatic disease with New left adrenal metastasis;  PET scan JUNE 20th- 2023-  Enlarged 4 cm hypermetabolic LEFT adrenal metastasis;  Suspicion of metastatic adenopathy to the LEFT hilum.  Part solid nodule in the RIGHT lower lobe without metabolic activity. 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 Currently on single agent Keytruda  [PD-L1 greater than 80%].   # Continue with keytruda  immunotherapy- Proceed with # 33 of planned #33 [ until SEP 2025 x2 years]-proceed with treatment today. CT CAP- sep 11th, 2025- No evidence of lung cancer recurrence in the LEFT lower lobe. Stable ground-glass nodules in the RIGHT lower lobe.  No mediastinal adenopathy. Recommend continued surveillance.   # AS patient is doing well from cancer stand point- negative CT scan- will plan discontinuation of keytruda  [been on keytruda  x 2 years]. Will repeat CT CAP in 4  months  # Nausea/vomiting-   resolved s/p compazine . Likely unrelated to keytruda -   improved on lower dose of  Ozempic -Stable.  # Mild Hypokalemia: discussed dietary supplement.Stable.    # Chronic pain: headaches/cervical pain/back pain [Dr.Naveira; pain doctor]  On NSAIDs [ monitor for now. OCT 2023- MRI brain- NEGATIVE for any metastatic disease.   Stable.  FEB 2024- MRI lumbar spine- degenerative disease- on ibuprofen prn- BID.Stable.    # Left rib pain-Post throacotomy pain/tingling and numbness: stable [ DISContinue gabapentin ].   stable  # CAD/ PVD- cramping- s/p evaluation [Dr.Schneir]; ? Carotid occlusion- s/p CEA surgery- CT JULY 2025  Extensive atherosclerotic calcification within the thoracic aorta with almost certain hemodynamically significant stenosis of the left subclavian artery to repeat origin-s/p evaluation with  Dr. Jama. Stable.    # COPD-stable encouraged continue to avoid smoking; again  counseled to quit smoking; s/p pulmonary-   Stable.   # vit D def [April 2025-PCP - 28- recommend vitD Supp BID- OTC; will check in 6 weeks- if not improved then ergo weekly- Stable.   # ACP: [03/31/2024] -Full code.  # IV Access :s/p  port placement stable  *AM appts- Monday-    # DISPOSITION:  #  keytruda  today # in 2  months port flush # follow up in4 months- -MD labs/port- cbc/cmp; possible Keytruda - CAP CT scan-Dr. B  # I reviewed the blood work- with the patient in detail; also reviewed the imaging independently [as summarized above]; and with the patient in detail.

## 2024-08-25 NOTE — Progress Notes (Signed)
 08/14/24 CT chest.

## 2024-08-25 NOTE — Telephone Encounter (Signed)
 The patient called back because she was understanding that there is no more treatment right now and then when she comes back after the port flush getting a scan and then seeing Dr. Rennie but then there is a infusion for that also for possible Keytruda  and she did not think that that was what we were going to do. She just wants to know if she going to have to do the Keytruda  when she comes back in. The next appts 12/25/2024 and she thought she was not getting keyytruda

## 2024-08-25 NOTE — Progress Notes (Signed)
 [facility] CONSULT NOTE  Patient Care Team: Laura Nancyann BRAVO, MD as PCP - General (Family Medicine) Schnier, Cordella MATSU, MD (Vascular Surgery) Janalyn Keene NOVAK, MD (Inactive) as Consulting Physician (Gastroenterology) Verdene Gills, RN as Oncology Nurse Navigator Rennie Cindy SAUNDERS, MD as Consulting Physician (Oncology) Isadora Hose, MD as Consulting Physician (Pulmonary Disease) Ammon Blunt, MD as Consulting Physician (Cardiology) Enola Feliciano Hugger, MD as Consulting Physician (Ophthalmology)   CHIEF COMPLAINTS/PURPOSE OF CONSULTATION: lung cancer  #  Oncology History Overview Note  #MAY 2022- LLL nodule 2.3 cm Adenocarcinoma Stage IA; s/p lobectomy.  No adjuvant therapy  # CT scan 4th June 2023-highly concerning for recurrent/metastatic disease with-. New left adrenal metastasis;  Ground-glass and part solid nodules in the peripheral right lower lobe, unchanged from 11/07/2021 but slightly enlarged from 01/01/2018. Findings are suspicious for indolent adenocarcinoma.  F One- PFL1 =80%; KRAS G12C PET scan JUNE 20th- 2023-  Enlarged 4 cm hypermetabolic LEFT adrenal metastasis;  Suspicion of metastatic adenopathy to the LEFT hilum.  Part solid nodule in the RIGHT lower lobe without metabolic activity.   3 ADRENAL BIOPSY- CK7 POSITIVE ADENOCARCINOMA- There is limited tissue remaining for ancillary testing, not likely  sufficient for NGS testing.   # AUG 7th, 2023-single agent Keytruda .    Right mildly complex cystic adnexal mass noted-June 13th, 2024- Significant interval enlargement of a multiseptated cystic lesion of the right ovary, now measuring 11.7 x 8.2 cm, previously 8.1 x 5.8 cm.- s/p in [facility] [October, 2024]- mucinous cystadenoma - stable.   Primary cancer of left lower lobe of lung (HCC)  05/09/2021 Initial Diagnosis   Primary cancer of left lower lobe of lung (HCC)   07/04/2022 -  Chemotherapy   Patient is on Treatment Plan : LUNG NSCLC  Pembrolizumab  (200) q21d     07/10/2022 - 07/10/2022 Chemotherapy   Patient is on Treatment Plan : LUNG NSCLC Pembrolizumab  (200) q21d      HISTORY OF PRESENTING ILLNESS: Patient  ambulating.  Alone.   Laura Nielsen 68 y.o.  female history of smoking; prior history of lung cancer-with likely recurrence to left adrenal gland [s/p Bx CK-7 positive TTF-1 negative]-clinically suggestive of recurrent lung cancer on single agent Keytruda  is here for follow-up.  Denies any worsening nausea- vomiting. Patient has chronic concerns of consistent headache.  Not any worse.    Denies any abdominal pain. Otherwise patient continues to have mild shortness of breath on exertion. Complains of cramping in the legs chronic.   Review of Systems  Constitutional:  Negative for chills, diaphoresis, fever, malaise/fatigue and weight loss.  HENT:  Negative for nosebleeds and sore throat.   Eyes:  Negative for double vision.  Respiratory:  Negative for cough, hemoptysis, sputum production, shortness of breath and wheezing.   Cardiovascular:  Negative for palpitations, orthopnea and leg swelling.  Gastrointestinal:  Negative for abdominal pain, blood in stool, constipation, diarrhea, heartburn, melena, nausea and vomiting.  Genitourinary:  Negative for dysuria, frequency and urgency.  Musculoskeletal:  Positive for back pain and joint pain.  Skin: Negative.  Negative for itching and rash.  Neurological:  Positive for tingling. Negative for dizziness, focal weakness, weakness and headaches.  Endo/Heme/Allergies:  Does not bruise/bleed easily.  Psychiatric/Behavioral:  Negative for depression. The patient is not nervous/anxious and does not have insomnia.      MEDICAL HISTORY:  Past Medical History:  Diagnosis Date   AAA (abdominal aortic aneurysm) (HCC)    a.) s/p EVAR stent graft repair 03/27/2013  Adnexal mass    Aortic atherosclerosis (HCC)    Arthritis    Asthma    Atherosclerosis of native artery  of both lower extremities with intermittent claudication (HCC)    B12 deficiency    Bilateral carotid artery stenosis    a.) doppler 05/06/2020 and 06/30/2021: 1-39% BICA; b.) doppler 11/20/2022: 60-79% RICA; c.) CTA neck 08/17/2023: >75% RICA, 30% LICA   CAD (coronary artery disease) 03/16/2009   a.) LHC/PCI 03/16/2009: 25% mLCx, 70% mRCA (3 x 18 mm Xience V DES); b.) MV 10/22/2017 and 03/16/2021: no ischemia; c.) MV 07/16/2023: mild apical ischemia   Cancer of left adrenal gland (HCC) 06/20/2022   a.) CNB 06/20/2022 -> pathology (+) for CK7 (TTF-1 -) adenocarcinoma   Centrilobular emphysema (HCC)    Cervical spinal stenosis    Cervical spondylosis with radiculopathy    Chronic back pain    Colon polyp    Complex ovarian cyst    Complication of anesthesia    a.) prone to experience postoperative hypotension   COPD (chronic obstructive pulmonary disease) (HCC)    DDD (degenerative disc disease), cervical    DDD (degenerative disc disease), thoracolumbar    Depression    Diabetes mellitus type 2, controlled (HCC)    Diastolic dysfunction 06/29/2023   a.) TTE 06/29/2023: EF >55%, no RWMAs, G1DD, triv PR, mild MR/TR   Fatty liver    GERD (gastroesophageal reflux disease)    Gout    History of bilateral cataract extraction    History of kidney stones    Hypercholesteremia    Long term (current) use of aspirin     Long term current use of clopidogrel     Lumbar spinal stenosis    Lumbar spondylosis    Non-small cell lung cancer metastatic to adrenal gland (HCC)    a.) s/p LLL wedge resection 04/06/2021 --> pathology (+) for well differentiated adenocarcinoma (stage 1A) --> no adjuvant Tx; b.) recurrent stage IV adenocarcinoma 05/2022 --> Tx'd with  pembrolizumab    Osteoarthritis    Osteoporosis    Primary hypertension    Seasonal allergies    Sleep apnea    a.) no nocturnal PAP therapy since 2021   Vitamin D  deficiency     SURGICAL HISTORY: Past Surgical History:  Procedure  Laterality Date   ABDOMINAL AORTIC ENDOVASCULAR STENT GRAFT  03/27/2013   Dr. Cordella Shawl   ABDOMINAL HYSTERECTOMY     Menometrorrhagia. Excessive bleeding. Unknown if cervix removed.    BILATERAL SALPINGOOPHORECTOMY  09/27/2023   CATARACT EXTRACTION W/PHACO Left 05/14/2023   Procedure: CATARACT EXTRACTION PHACO AND INTRAOCULAR LENS PLACEMENT (IOC) LEFT DIABETIC  OMIDRIA   19.40  01:34.8;  Surgeon: Enola Feliciano Hugger, MD;  Location: [facility];  Service: Ophthalmology;  Laterality: Left;   CATARACT EXTRACTION W/PHACO Right 06/27/2023   Procedure: CATARACT EXTRACTION PHACO AND INTRAOCULAR LENS PLACEMENT (IOC) RIGHT DIABETIC  6.47  00:42.6;  Surgeon: Enola Feliciano Hugger, MD;  Location: [facility];  Service: Ophthalmology;  Laterality: Right;   COLONOSCOPY WITH PROPOFOL  N/A 01/05/2021   Procedure: COLONOSCOPY WITH PROPOFOL ;  Surgeon: Janalyn Keene NOVAK, MD;  Location: [facility] ENDOSCOPY;  Service: Endoscopy;  Laterality: N/A;   CORONARY ANGIOPLASTY WITH STENT PLACEMENT  03/16/2009   Procedure: CORONARY ANGIOPLASTY WITH STENT PLACEMENT; Location: [facility]; Surgeons: Vinie Jude, MD (diagnostic) and Deatrice Cage, MD (interventional)   ENDARTERECTOMY Right 11/09/2023   Procedure: ENDARTERECTOMY CAROTID;  Surgeon: Shawl Cordella MATSU, MD;  Location: [facility] ORS;  Service: Vascular;  Laterality: Right;   ESOPHAGOGASTRODUODENOSCOPY (EGD) WITH  PROPOFOL  N/A 01/05/2021   Procedure: ESOPHAGOGASTRODUODENOSCOPY (EGD) WITH PROPOFOL ;  Surgeon: Janalyn Keene NOVAK, MD;  Location: [facility] ENDOSCOPY;  Service: Endoscopy;  Laterality: N/A;   EXTRACORPOREAL SHOCK WAVE LITHOTRIPSY Right 1999   INTERCOSTAL NERVE BLOCK Left 04/06/2021   Procedure: INTERCOSTAL NERVE BLOCK;  Surgeon: Kerrin Elspeth BROCKS, MD;  Location: [facility] OR;  Service: Thoracic;  Laterality: Left;   IR IMAGING GUIDED PORT INSERTION  06/20/2022   LUNG LOBECTOMY Left 04/06/2021   robotic left lower lobectomy 04/06/2021 Dr. Kerrin  for Stage 1A adenocarcinoma   NODE DISSECTION Left 04/06/2021   Procedure: NODE DISSECTION;  Surgeon: Kerrin Elspeth BROCKS, MD;  Location: [facility] OR;  Service: Thoracic;  Laterality: Left;   TUBAL LIGATION      SOCIAL HISTORY: Social History   Socioeconomic History   Marital status: Divorced    Spouse name: Not on file   Number of children: 2   Years of education: H/S   Highest education level: 12th grade  Occupational History   Occupation: Disabled   Occupation: retired  Tobacco Use   Smoking status: Every Day    Current packs/day: 1.00    Average packs/day: 1 pack/day for 51.7 years (51.7 ttl pk-yrs)    Types: Cigarettes    Start date: 1974   Smokeless tobacco: Never   Tobacco comments:    12/23/19 states she quit for 9 months and then started back.     02/20/24 1/2 ppd  Vaping Use   Vaping status: Never Used  Substance and Sexual Activity   Alcohol  use: Not Currently   Drug use: No   Sexual activity: Not on file  Other Topics Concern   Not on file  Social History Narrative   Hx of smoking; quit prior to lung surgery then started back. Lives in [place] alone. Used to work in [employer], [employer]- Facilities manager. On disability sec to spinal pain.    Social Drivers of Health   Financial Resource Strain: Medium Risk (02/20/2024)   Overall Financial Resource Strain (CARDIA)    Difficulty of Paying Living Expenses: Somewhat hard  Food Insecurity: No Food Insecurity (02/20/2024)   Hunger Vital Sign    Worried About Running Out of Food in the Last Year: Never true    Ran Out of Food in the Last Year: Never true  Transportation Needs: No Transportation Needs (02/20/2024)   PRAPARE - Administrator, Civil Service (Medical): No    Lack of Transportation (Non-Medical): No  Physical Activity: Inactive (02/20/2024)   Exercise Vital Sign    Days of Exercise per Week: 0 days    Minutes of Exercise per Session: 0 min  Stress: Stress Concern Present (02/20/2024)   [employer] of  Occupational Health - Occupational Stress Questionnaire    Feeling of Stress : To some extent  Social Connections: Socially Isolated (02/20/2024)   Social Connection and Isolation Panel    Frequency of Communication with Friends and Family: More than three times a week    Frequency of Social Gatherings with Friends and Family: Once a week    Attends Religious Services: Never    Database administrator or Organizations: No    Attends Banker Meetings: Never    Marital Status: Divorced  Catering manager Violence: Not At Risk (02/20/2024)   Humiliation, Afraid, Rape, and Kick questionnaire    Fear of Current or Ex-Partner: No    Emotionally Abused: No    Physically Abused: No    Sexually Abused: No  FAMILY HISTORY: Family History  Problem Relation Age of Onset   Hypertension Mother    Coronary artery disease Mother    Heart attack Mother        acute   Cancer Mother    Alcohol  abuse Father    Depression Father    Hypertension Father    Heart attack Father 16       acute   Alcohol  abuse Sister    Hyperlipidemia Sister    Hypertension Sister    Cancer Sister 79   Heart attack Sister        x's 2   Coronary artery disease Sister 70       x's 2   Breast cancer Sister 38    ALLERGIES:  is allergic to atorvastatin, omeprazole, and bupropion .  MEDICATIONS:  Current Outpatient Medications  Medication Sig Dispense Refill   acetaminophen  (TYLENOL ) 500 MG tablet Take 500 mg by mouth every 6 (six) hours as needed for moderate pain (pain score 4-6).     albuterol  (VENTOLIN  HFA) 108 (90 Base) MCG/ACT inhaler Inhale 2 puffs into the lungs every 6 (six) hours as needed for wheezing or shortness of breath. 8.5 g 2   allopurinol  (ZYLOPRIM ) 100 MG tablet Take 1 tablet (100 mg total) by mouth daily. 90 tablet 4   aspirin  81 MG tablet Take 81 mg by mouth daily.      bisoprolol  (ZEBETA ) 10 MG tablet Take 1 tablet (10 mg total) by mouth daily. 90 tablet 0    Budeson-Glycopyrrol-Formoterol  (BREZTRI  AEROSPHERE) 160-9-4.8 MCG/ACT AERO Inhale 2 puffs into the lungs in the morning and at bedtime. 1284 g 11   clopidogrel  (PLAVIX ) 75 MG tablet Take 1 tablet (75 mg total) by mouth daily. 90 tablet 1   ibuprofen (ADVIL) 200 MG tablet Take 200 mg by mouth 2 (two) times daily as needed (headaches).     lactulose  (CHRONULAC ) 10 GM/15ML solution Take 30 mLs (20 g total) by mouth 2 (two) times daily as needed for mild constipation. 236 mL 0   lidocaine -prilocaine  (EMLA ) cream Apply on the port. 30 -45 min  prior to port access. 30 g 3   lisinopril -hydrochlorothiazide  (ZESTORETIC ) 20-12.5 MG tablet Take 1 tablet by mouth daily. 90 tablet 2   magnesium gluconate (MAGONATE) 500 MG tablet Take 500 mg by mouth at bedtime.     oxyCODONE  (OXY IR/ROXICODONE ) 5 MG immediate release tablet Take 1-2 tablets (5-10 mg total) by mouth every 4 (four) hours as needed for moderate pain (pain score 4-6). 30 tablet 0   pantoprazole  (PROTONIX ) 20 MG tablet Take 1 tablet (20 mg total) by mouth daily. 90 tablet 4   Polyethyl Glyc-Propyl Glyc PF (SYSTANE PRESERVATIVE FREE) 0.4-0.3 % SOLN Place 1 drop into both eyes daily as needed (dry eyes).     prochlorperazine  (COMPAZINE ) 10 MG tablet Take 1 tablet (10 mg total) by mouth every 6 (six) hours as needed for nausea or vomiting. 45 tablet 1   rosuvastatin  (CRESTOR ) 20 MG tablet Take 1 tablet (20 mg total) by mouth daily. 90 tablet 4   Semaglutide ,0.25 or 0.5MG /DOS, (OZEMPIC , 0.25 OR 0.5 MG/DOSE,) 2 MG/1.5ML SOPN Inject 0.5 mg into the skin once a week. 6 mL 1   No current facility-administered medications for this visit.   Facility-Administered Medications Ordered in Other Visits  Medication Dose Route Frequency Provider Last Rate Last Admin   0.9 %  sodium chloride  infusion   Intravenous Once Kaymon Denomme R, MD  heparin  lock flush 100 UNIT/ML injection            pembrolizumab  (KEYTRUDA ) 200 mg in sodium chloride  0.9 % 50 mL  chemo infusion  200 mg Intravenous Once Katonya Blecher R, MD        Mild maculopapular rash on the Maller area left more than right.  PHYSICAL EXAMINATION: ECOG PERFORMANCE STATUS: 1 - Symptomatic but completely ambulatory  Vitals:   08/25/24 0851  BP: 136/85  Pulse: 63  Resp: 16  Temp: (!) 95.9 F (35.5 C)        Filed Weights   08/25/24 0851  Weight: 179 lb 9.6 oz (81.5 kg)         Physical Exam HENT:     Head: Normocephalic and atraumatic.     Mouth/Throat:     Pharynx: No oropharyngeal exudate.  Eyes:     Pupils: Pupils are equal, round, and reactive to light.  Cardiovascular:     Rate and Rhythm: Normal rate and regular rhythm.  Pulmonary:     Comments: Decreased breath sounds bilaterally.  No wheeze or crackles Abdominal:     General: Bowel sounds are normal. There is no distension.     Palpations: Abdomen is soft. There is no mass.     Tenderness: There is no abdominal tenderness. There is no guarding or rebound.  Musculoskeletal:        General: No tenderness. Normal range of motion.     Cervical back: Normal range of motion and neck supple.  Skin:    General: Skin is warm.  Neurological:     Mental Status: She is alert and oriented to person, place, and time.  Psychiatric:        Mood and Affect: Affect normal.      LABORATORY DATA:  I have reviewed the data as listed Lab Results  Component Value Date   WBC 6.6 08/25/2024   HGB 14.7 08/25/2024   HCT 43.7 08/25/2024   MCV 84.7 08/25/2024   PLT 185 08/25/2024   Recent Labs    07/15/24 0907 08/05/24 0908 08/25/24 0855  NA 138 137 140  K 3.8 3.8 3.7  CL 106 106 108  CO2 24 23 24   GLUCOSE 93 93 98  BUN 14 15 19   CREATININE 0.59 0.58 0.65  CALCIUM  9.6 9.4 9.3  GFRNONAA >60 >60 >60  PROT 6.4* 6.7 6.8  ALBUMIN 3.6 3.8 3.8  AST 23 20 20   ALT 14 12 13   ALKPHOS 88 88 90  BILITOT 0.7 0.7 0.9    RADIOGRAPHIC STUDIES: I have personally reviewed the radiological images as  listed and agreed with the findings in the report. CT CHEST W CONTRAST Result Date: 08/20/2024 CLINICAL DATA:  LEFT lower lobe primary lung carcinoma. * Tracking Code: BO * EXAM: CT CHEST WITH CONTRAST TECHNIQUE: Multidetector CT imaging of the chest was performed during intravenous contrast administration. RADIATION DOSE REDUCTION: This exam was performed according to the departmental dose-optimization program which includes automated exposure control, adjustment of the mA and/or kV according to patient size and/or use of iterative reconstruction technique. CONTRAST:  75mL OMNIPAQUE  IOHEXOL  300 MG/ML  SOLN COMPARISON:  None Available. FINDINGS: Cardiovascular: Port in the anterior chest wall with tip in distal SVC. Coronary artery calcification and aortic atherosclerotic calcification. Mediastinum/Nodes: No axillary or supraclavicular adenopathy. No mediastinal or hilar adenopathy. No pericardial fluid. Esophagus normal. Lungs/Pleura: No evidence of lung cancer recurrence in the LEFT lower lobe. Several ground-glass nodules in the RIGHT lower  lobe are unchanged. Example 9 mm nodule on image 91/4 compares to 10 mm. 12 mm nodule in the RIGHT lower lobe on image 97 compares to 12 mm. No new nodules. Upper Abdomen: Limited view of the liver, kidneys, pancreas are unremarkable. Normal adrenal glands. Musculoskeletal: Extensive sclerotic endplate change in osteophytosis of the thoracic spine. IMPRESSION: 1. No evidence of lung cancer recurrence in the LEFT lower lobe. 2. Stable ground-glass nodules in the RIGHT lower lobe. Recommend continued surveillance. 3. No mediastinal adenopathy. 4.  Aortic Atherosclerosis (ICD10-I70.0). Electronically Signed   By: Jackquline Boxer M.D.   On: 08/20/2024 12:07        ASSESSMENT & PLAN:   Primary cancer of left lower lobe of lung (HCC) #JUNE 2023-STAGE IV--recurrent/metastatic disease with New left adrenal metastasis;  PET scan JUNE 20th- 2023-  Enlarged 4 cm  hypermetabolic LEFT adrenal metastasis;  Suspicion of metastatic adenopathy to the LEFT hilum.  Part solid nodule in the RIGHT lower lobe without metabolic activity. 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 Currently on single agent Keytruda  [PD-L1 greater than 80%].   # Continue with keytruda  immunotherapy- Proceed with # 33 of planned #33 [ until SEP 2025 x2 years]-proceed with treatment today. CT CAP- sep 11th, 2025- No evidence of lung cancer recurrence in the LEFT lower lobe. Stable ground-glass nodules in the RIGHT lower lobe.  No mediastinal adenopathy. Recommend continued surveillance.   # AS patient is doing well from cancer stand point- negative CT scan- will plan discontinuation of keytruda  [been on keytruda  x 2 years]. Will repeat CT CAP in 4  months  # Nausea/vomiting-   resolved s/p compazine . Likely unrelated to keytruda -   improved on lower dose of  Ozempic -Stable.  # Mild Hypokalemia: discussed dietary supplement.Stable.    # Chronic pain: headaches/cervical pain/back pain [Dr.Naveira; pain doctor]  On NSAIDs [ monitor for now. OCT 2023- MRI brain- NEGATIVE for any metastatic disease.   Stable.  FEB 2024- MRI lumbar spine- degenerative disease- on ibuprofen prn- BID.Stable.    # Left rib pain-Post throacotomy pain/tingling and numbness: stable [ DISContinue gabapentin ].   stable  # CAD/ PVD- cramping- s/p evaluation [Dr.Schneir]; ? Carotid occlusion- s/p CEA surgery- CT JULY 2025  Extensive atherosclerotic calcification within the thoracic aorta with almost certain hemodynamically significant stenosis of the left subclavian artery to repeat origin-s/p evaluation with  Dr. Jama. Stable.    # COPD-stable encouraged continue to avoid smoking; again counseled to quit smoking; s/p pulmonary-   Stable.   # vit D def [April 2025-PCP - 28- recommend vitD Supp BID- OTC; will  check in 6 weeks- if not improved then ergo weekly- Stable.   # ACP: [03/31/2024] -Full code.  # IV Access :s/p  port placement stable  *AM appts- Monday-    # DISPOSITION:  #  keytruda  today # in 2  months port flush # follow up in4 months- -MD labs/port- cbc/cmp; possible Keytruda - CAP CT scan-Dr. B  # I reviewed the blood work- with the patient in detail; also reviewed the imaging independently [as summarized above]; and with the patient in detail.           Cindy JONELLE Joe, MD 08/25/2024 10:37 AM

## 2024-08-26 ENCOUNTER — Other Ambulatory Visit: Payer: Self-pay | Admitting: Internal Medicine

## 2024-08-26 LAB — T4: T4, Total: 6.9 ug/dL (ref 4.5–12.0)

## 2024-08-26 NOTE — Telephone Encounter (Signed)
 RN called and left voicemail that per Dr Rennie  Please inform the patient-that will give her a break from treatment. Will cancel the infusion appointment. Message left that no infusion at follow up appointment in January and to call clinic for any further questions.

## 2024-09-02 ENCOUNTER — Encounter: Payer: Self-pay | Admitting: Student in an Organized Health Care Education/Training Program

## 2024-09-02 ENCOUNTER — Ambulatory Visit: Admitting: Student in an Organized Health Care Education/Training Program

## 2024-09-02 VITALS — BP 118/80 | HR 69 | Temp 97.1°F | Ht 68.0 in | Wt 181.6 lb

## 2024-09-02 DIAGNOSIS — J432 Centrilobular emphysema: Secondary | ICD-10-CM | POA: Diagnosis not present

## 2024-09-02 DIAGNOSIS — Z85118 Personal history of other malignant neoplasm of bronchus and lung: Secondary | ICD-10-CM | POA: Diagnosis not present

## 2024-09-02 DIAGNOSIS — F17210 Nicotine dependence, cigarettes, uncomplicated: Secondary | ICD-10-CM | POA: Diagnosis not present

## 2024-09-02 MED ORDER — NICOTINE 7 MG/24HR TD PT24: 7.0000 mg | MEDICATED_PATCH | TRANSDERMAL | 0 refills | Status: AC

## 2024-09-02 MED ORDER — NICOTINE 21 MG/24HR TD PT24: 21.0000 mg | MEDICATED_PATCH | TRANSDERMAL | 0 refills | Status: AC

## 2024-09-02 MED ORDER — TRELEGY ELLIPTA 100-62.5-25 MCG/ACT IN AEPB: 1.0000 | INHALATION_SPRAY | Freq: Every day | RESPIRATORY_TRACT | 12 refills | Status: AC

## 2024-09-02 MED ORDER — NICOTINE 14 MG/24HR TD PT24: 14.0000 mg | MEDICATED_PATCH | TRANSDERMAL | 0 refills | Status: AC

## 2024-09-02 NOTE — Patient Instructions (Signed)
 [facility]  [facility]uit[facility]: Call 1-800-QUIT-NOW (682 840 6469). The [place] [facility]uit[facility] is a free service for [place]  residents. Trained counselors are available from 8 am until 3 am, 365 days per year. Services are available in both [place] and [place].   Web Resources Free online support programs can help you track your progress and share experiences with others who are quitting. These are examples: www.becomeanex.org www.trytostop.org  www.smokefree.gov  www.https://www.vargas.com/.aspx  [facility]: offers comprehensive in-person tobacco treatment counseling at [facility] building ([address], [place] [address]).  Open to everyone. Virtual appointments available. Free parking. Call [phone] to schedule an appointment or [phone] for general information.    Tobacco Cessation Medications  Nicotine  Replacement Therapy (NRT)  Nicotine  is the addictive part of tobacco smoke, but not the most dangerous part. There are 7000 other toxins in cigarettes, including carbon monoxide, that cause disease. People do not generally become addicted to medication. Common problems: People don't use enough medication or stop too early. Medications are safe and effective. Overdose is very uncommon. Use medications as long as needed (3 months minimum). Some combinations work better than single medications. Long acting medications like the NRT patch and bupropion provide continuous treatment for withdrawal symptoms.  PLUS  Short acting medications like the NRT gum, lozenge, inhaler, and nasal spray help people to cope with breakthrough cravings.  ? Nicotine  Patch  Place patch on hairless skin on upper body, including arms and back. Each day: discard old patch, shower, apply new patch to a different site. Apply hydrocortisone cream to mildly red/irritated areas. Call provider if rash develops. If patch causes sleep disturbance, remove patch  at bedtime and replace each morning after shower. Side effects may include: skin irritation, headache, insomnia, abnormal/vivid dreams.  ? Nicotine  Gum  Chew gum slowly, park in cheek when peppery taste or tingling sensation begins (about 15-30 chews). When taste or tingling goes away, begin chewing again. Use until nicotine  is gone (taste or tingle does not return, usually 30 minutes). Park in different areas of mouth. Nicotine  is absorbed through the lining of the mouth. Use enough to control cravings, up to 24 pieces per day (if used alone). Avoid eating or drinking for 15 minutes before using and during use. Side effects may include: mouth/jaw soreness, hiccups, indigestion, hypersalivation.  If gum is not chewed correctly, additional side effects may include lightheadedness, nausea/vomiting, throat and mouth irritation.  ? Nicotine  Lozenge  Allow to dissolve slowly in mouth (20-30 minutes). Do not chew or swallow. Nicotine  release may cause a warm tingling sensation. Occasionally rotate to different areas of the mouth. Use enough to control cravings, up to 20 lozenges per day (if used alone). Avoid eating or drinking for 15 minutes before using and during use. Side effects may include: nausea, hiccups, cough, heartburn, headache, gas, insomnia.  ? Nicotine  Nasal Spray Use 1 spray in each nostril (1 dose) and tilt head back for 1 minute. Do not sniff, swallow, or inhale through nose.  Use at least 8 doses (1 spray in each nostril) , up to 40 doses per day (if used alone). To reduce nasal irritation, spray on cotton swab and insert into nose. Side effects may include: nasal and/or throat irritation (hot, peppery, or burning sensation), nasal irritation, tearing, sneezing, cough, headache.  ? Nicotine  Oral Inhaler (puffer) Inhale into the back of the throat or puff in short breaths. Do not inhale into the lungs.  Puff continuously for 20 minutes (about 80 puffs) until cartridge  is  empty. Change cartridge when it loses the "burning in throat" sensation (feels like air only). Open cartridges can be saved and used again within 24 hours. Use at least 6 and up to 16 cartridges per day (if used alone).  Avoid eating or drinking for 15 minutes before using and during use. Side effects may include: mouth and/or throat irritation, unpleasant taste, cough, nasal irritation, indigestion, hiccups, headache.  ? Chantix  (varenicline ) Days 1-3: Take one 0.5 mg white pill each morning for 3 days, one week before quit date. Days 4-7: Increase to one 0.5 mg white pill twice a day in morning and evening for 4 days.  On Day 8 (target quit date), increase to one 1 mg blue pill twice a day. Maintain this dose for a minimum of 3 months. Take with food and a full glass of water to reduce nausea. Be sure that the two doses are at least 8 hours apart, but try to take second dose early in the evening (i.e. 6 pm) to avoid sleep problems. Common side effects include: nausea, insomnia, headache, abnormal/vivid dreams. Tell your doctor if you have any history of psychiatric illness prior to starting Chantix .  STOP taking CHANTIX  and contact a healthcare provider immediately if you experience agitation, hostility, depressed mood, changes in thoughts or behavior that are not typical for you, thinking about or attempting suicide, allergic or skin reactions including swelling, rash, redness, or peeling of the skin.  For patients who have heart disease: Smoking is a major risk factor for cardiovascular disease, and Chantix  can help you quit smoking. Chantix  may be associated with a small, increased risk of certain heart events in patients who have heart disease. If you have any new or worsening symptoms of heart disease while taking Chantix , such as shortness of breath or trouble breathing, new or worsening chest pain, or new or worsening pain in your legs when walking, call your doctor or get emergency medical  help immediately.  ? Wellbutrin / Zyban (bupropion) Take one 150 mg pill each morning for 3 days, one week before target quit date. On Day 4, increase to one 150 mg pill twice a day, morning and evening.  Maintain this dose for a minimum of 3 months. Be sure that the two doses are at least 8 hours apart, but try to take second dose early in the evening (i.e. 6 pm) to avoid sleep problems. Avoid or minimize use of alcohol when taking this medication. Common side effects include: dry mouth, headache, insomnia, nausea, weight loss.  Risk of seizure is 12/998. STOP taking BUPROPION and contact a healthcare provider immediately if you experience agitation, hostility, depressed mood, changes in thoughts or behavior that are not typical for you, thinking about or attempting suicide, allergic or skin reactions including swelling, rash, redness, or peeling of the skin.

## 2024-09-02 NOTE — Progress Notes (Signed)
 Assessment & Plan:   #Centrilobular emphysema #PRISM  #Asthma/COPD Overlap  Presenting for follow-up of shortness of breath as well as emphysema with PFTs from 2022 showing a preserved ratio of 0.7 with impaired spirometry consistent with prism .  Her max eosinophil count was elevated at 600 historically which does suggest a component of asthma/COPD overlap.  She was started on triple ICS/LABA/LAMA therapy with Breztri  which she has tolerated well but she continues to forget using it twice a day.  Given this, I will consolidate her regimen with once daily Trelegy and reevaluate for symptomatic response.  Finally, she has been on pembrolizumab  with good response and has received her final dose this month.  Reviewed her most recent chest CT in September which similarly does not show any signs of pembrolizumab  associated lung toxicity.  - Fluticasone -Umeclidin-Vilant (TRELEGY ELLIPTA) 100-62.5-25 MCG/ACT AEPB; Inhale 1 puff into the lungs daily.  Dispense: 30 each; Refill: 12  #Cigarette nicotine dependence without complication  She continues to smoke but is motivated to quit.  She has previously tried Wellbutrin  and Chantix both of which caused her significant side effects.  She could not tolerate the gum or lozenges due to a burning sensation in her mouth.  We will try to start her on nicotine patches as well as give her more resources to help her quit.  Encouraged her to attempt full smoking cessation.  - nicotine (NICODERM CQ - DOSED IN MG/24 HOURS) 21 mg/24hr patch; Place 1 patch (21 mg total) onto the skin daily.  Dispense: 42 patch; Refill: 0 - nicotine (NICODERM CQ - DOSED IN MG/24 HOURS) 14 mg/24hr patch; Place 1 patch (14 mg total) onto the skin daily for 14 days.  Dispense: 14 patch; Refill: 0 - nicotine (NICODERM CQ - DOSED IN MG/24 HR) 7 mg/24hr patch; Place 1 patch (7 mg total) onto the skin daily for 14 days.  Dispense: 14 patch; Refill: 0  #Primary cancer of left lower lobe of lung  (HCC)   Has finished a course of pembrolizumab , followed closely by Dr. Berline day. Reviewed imaging, no sign of recurrence or pneumonitis.  Return in about 6 months (around 03/02/2025).  I spent 30 minutes caring for this patient today, including preparing to see the patient, obtaining a medical history , reviewing a separately obtained history, performing a medically appropriate examination and/or evaluation, counseling and educating the patient/family/caregiver, ordering medications, tests, or procedures, documenting clinical information in the electronic health record, and independently interpreting results (not separately reported/billed) and communicating results to the patient/family/caregiver  Laura November, MD [facility]  End of visit medications:  Meds ordered this encounter  Medications   Fluticasone -Umeclidin-Vilant (TRELEGY ELLIPTA) 100-62.5-25 MCG/ACT AEPB    Sig: Inhale 1 puff into the lungs daily.    Dispense:  30 each    Refill:  12   nicotine (NICODERM CQ - DOSED IN MG/24 HOURS) 21 mg/24hr patch    Sig: Place 1 patch (21 mg total) onto the skin daily.    Dispense:  42 patch    Refill:  0   nicotine (NICODERM CQ - DOSED IN MG/24 HOURS) 14 mg/24hr patch    Sig: Place 1 patch (14 mg total) onto the skin daily for 14 days.    Dispense:  14 patch    Refill:  0   nicotine (NICODERM CQ - DOSED IN MG/24 HR) 7 mg/24hr patch    Sig: Place 1 patch (7 mg total) onto the skin daily for 14 days.  Dispense:  14 patch    Refill:  0     Current Outpatient Medications:    acetaminophen  (TYLENOL ) 500 MG tablet, Take 500 mg by mouth every 6 (six) hours as needed for moderate pain (pain score 4-6)., Disp: , Rfl:    albuterol  (VENTOLIN  HFA) 108 (90 Base) MCG/ACT inhaler, Inhale 2 puffs into the lungs every 6 (six) hours as needed for wheezing or shortness of breath., Disp: 8.5 g, Rfl: 2   allopurinol  (ZYLOPRIM ) 100 MG tablet, Take 1 tablet (100 mg total) by  mouth daily., Disp: 90 tablet, Rfl: 4   aspirin  81 MG tablet, Take 81 mg by mouth daily. , Disp: , Rfl:    bisoprolol  (ZEBETA ) 10 MG tablet, Take 1 tablet (10 mg total) by mouth daily., Disp: 90 tablet, Rfl: 0   clopidogrel  (PLAVIX ) 75 MG tablet, Take 1 tablet (75 mg total) by mouth daily., Disp: 90 tablet, Rfl: 1   Fluticasone -Umeclidin-Vilant (TRELEGY ELLIPTA) 100-62.5-25 MCG/ACT AEPB, Inhale 1 puff into the lungs daily., Disp: 30 each, Rfl: 12   ibuprofen (ADVIL) 200 MG tablet, Take 200 mg by mouth 2 (two) times daily as needed (headaches)., Disp: , Rfl:    lactulose  (CHRONULAC ) 10 GM/15ML solution, Take 30 mLs (20 g total) by mouth 2 (two) times daily as needed for mild constipation., Disp: 236 mL, Rfl: 0   lidocaine -prilocaine  (EMLA ) cream, Apply on the port. 30 -45 min  prior to port access., Disp: 30 g, Rfl: 3   lisinopril -hydrochlorothiazide  (ZESTORETIC ) 20-12.5 MG tablet, Take 1 tablet by mouth daily., Disp: 90 tablet, Rfl: 2   magnesium gluconate (MAGONATE) 500 MG tablet, Take 500 mg by mouth at bedtime., Disp: , Rfl:    nicotine (NICODERM CQ - DOSED IN MG/24 HOURS) 14 mg/24hr patch, Place 1 patch (14 mg total) onto the skin daily for 14 days., Disp: 14 patch, Rfl: 0   nicotine (NICODERM CQ - DOSED IN MG/24 HOURS) 21 mg/24hr patch, Place 1 patch (21 mg total) onto the skin daily., Disp: 42 patch, Rfl: 0   nicotine (NICODERM CQ - DOSED IN MG/24 HR) 7 mg/24hr patch, Place 1 patch (7 mg total) onto the skin daily for 14 days., Disp: 14 patch, Rfl: 0   oxyCODONE  (OXY IR/ROXICODONE ) 5 MG immediate release tablet, Take 1-2 tablets (5-10 mg total) by mouth every 4 (four) hours as needed for moderate pain (pain score 4-6)., Disp: 30 tablet, Rfl: 0   pantoprazole  (PROTONIX ) 20 MG tablet, Take 1 tablet (20 mg total) by mouth daily., Disp: 90 tablet, Rfl: 4   Polyethyl Glyc-Propyl Glyc PF (SYSTANE PRESERVATIVE FREE) 0.4-0.3 % SOLN, Place 1 drop into both eyes daily as needed (dry eyes)., Disp: , Rfl:     prochlorperazine  (COMPAZINE ) 10 MG tablet, Take 1 tablet (10 mg total) by mouth every 6 (six) hours as needed for nausea or vomiting., Disp: 45 tablet, Rfl: 1   rosuvastatin  (CRESTOR ) 20 MG tablet, Take 1 tablet (20 mg total) by mouth daily., Disp: 90 tablet, Rfl: 4   Semaglutide ,0.25 or 0.5MG /DOS, (OZEMPIC , 0.25 OR 0.5 MG/DOSE,) 2 MG/1.5ML SOPN, Inject 0.5 mg into the skin once a week., Disp: 6 mL, Rfl: 1 No current facility-administered medications for this visit.  Facility-Administered Medications Ordered in Other Visits:    heparin  lock flush 100 UNIT/ML injection, , , ,    Subjective:   PATIENT ID: Laura Nielsen GENDER: female DOB: [birth year], MRN: [id number]  Chief Complaint  Patient presents with   Emphysema    Breathing  is fine. Cough with white/clear sputum. Occasional SOB. No using Breztri  twice a day.     HPI  Patient is a pleasant 67 year old female presenting for follow up.  Return Visit 01/17/2024:   Symptom burden remains unchanged. She continues to have exertional dyspnea and a chronic non-productive cough. Does have an occasional wheeze that resolves with albuterol . Symptoms are not changed compared to prior. Continues to deny chest pain, tightness. Denies fevers, chills, or hemoptysis. Patient continues to smoke. She has been under a lot of stress, with housing instability recently. She did reach out to PCP and was started on Wellbutrin , with plans for smoking cessation.    She was told in the past that she had COPD and was prescribed an albuterol  inhaler that she rarely used. We started Breztri  during a previous visit and she has been compliant with it, and reports symptomatic improvement secondary to its use.  Return Visit 09/02/2024:  She continues to have cough productive of some sputum.  She does have shortness of breath with exertion but her overall respiratory symptoms are stable.  She regularly forgets to use her Breztri  twice a day and only remembers  that in the morning.  She continues to smoke but is motivated to quit smoking.  One of her cats passed away recently which was tough for her.   She has a history of lung cancer diagnosed in May of 2022 s/p lobectomy with recurrence in June of 2023. She was found to have left adrenal mets and confirmed to be adenocarcinoma by biopsy. She was started on pembrolizumab  which she continues to tolerate. She had her last infusion of Pembrolizumab  this month. Furthermore, patient is known to have a right complex cystic adnexal mass with interval enlargement, now measuring 11.7 x 8.2 cm awaiting Gyn-Onc evaluation.   She is a current smoker, smoking between 1/2 pack and 1 pack daily. She denies any occupational exposures and is currently retired. She has a cat at home.  Ancillary information including prior medications, full medical/surgical/family/social histories, and PFTs (when available) are listed below and have been reviewed.    Review of Systems  Constitutional:  Negative for chills, fever, malaise/fatigue and weight loss.  Respiratory:  Positive for cough, sputum production and shortness of breath. Negative for hemoptysis and wheezing.   Cardiovascular:  Negative for chest pain and palpitations.     Objective:   Vitals:   09/02/24 0844  BP: 118/80  Pulse: 69  Temp: (!) 97.1 F (36.2 C)  SpO2: 99%  Weight: 181 lb 9.6 oz (82.4 kg)  Height: 5' 8 (1.727 m)   99% on RA  BMI Readings from Last 3 Encounters:  09/02/24 27.61 kg/m  08/25/24 27.31 kg/m  08/05/24 27.34 kg/m   Wt Readings from Last 3 Encounters:  09/02/24 181 lb 9.6 oz (82.4 kg)  08/25/24 179 lb 9.6 oz (81.5 kg)  08/05/24 179 lb 12.8 oz (81.6 kg)    Physical Exam Constitutional:      Appearance: Normal appearance.  Cardiovascular:     Rate and Rhythm: Normal rate and regular rhythm.     Pulses: Normal pulses.     Heart sounds: Normal heart sounds.  Pulmonary:     Effort: Pulmonary effort is normal.     Breath  sounds: Normal breath sounds.  Neurological:     General: No focal deficit present.     Mental Status: She is alert and oriented to person, place, and time. Mental status is at baseline.  Ancillary Information    Past Medical History:  Diagnosis Date   AAA (abdominal aortic aneurysm)    a.) s/p EVAR stent graft repair 03/27/2013   Adnexal mass    Aortic atherosclerosis    Arthritis    Asthma    Atherosclerosis of native artery of both lower extremities with intermittent claudication    B12 deficiency    Bilateral carotid artery stenosis    a.) doppler 05/06/2020 and 06/30/2021: 1-39% BICA; b.) doppler 11/20/2022: 60-79% RICA; c.) CTA neck 08/17/2023: >75% RICA, 30% LICA   CAD (coronary artery disease) 03/16/2009   a.) LHC/PCI 03/16/2009: 25% mLCx, 70% mRCA (3 x 18 mm Xience V DES); b.) MV 10/22/2017 and 03/16/2021: no ischemia; c.) MV 07/16/2023: mild apical ischemia   Cancer of left adrenal gland (HCC) 06/20/2022   a.) CNB 06/20/2022 -> pathology (+) for CK7 (TTF-1 -) adenocarcinoma   Centrilobular emphysema (HCC)    Cervical spinal stenosis    Cervical spondylosis with radiculopathy    Chronic back pain    Colon polyp    Complex ovarian cyst    Complication of anesthesia    a.) prone to experience postoperative hypotension   COPD (chronic obstructive pulmonary disease) (HCC)    DDD (degenerative disc disease), cervical    DDD (degenerative disc disease), thoracolumbar    Depression    Diabetes mellitus type 2, controlled (HCC)    Diastolic dysfunction 06/29/2023   a.) TTE 06/29/2023: EF >55%, no RWMAs, G1DD, triv PR, mild MR/TR   Fatty liver    GERD (gastroesophageal reflux disease)    Gout    History of bilateral cataract extraction    History of kidney stones    Hypercholesteremia    Long term (current) use of aspirin     Long term current use of clopidogrel     Lumbar spinal stenosis    Lumbar spondylosis    Non-small cell lung cancer metastatic to adrenal  gland (HCC)    a.) s/p LLL wedge resection 04/06/2021 --> pathology (+) for well differentiated adenocarcinoma (stage 1A) --> no adjuvant Tx; b.) recurrent stage IV adenocarcinoma 05/2022 --> Tx'd with  pembrolizumab    Osteoarthritis    Osteoporosis    Primary hypertension    Seasonal allergies    Sleep apnea    a.) no nocturnal PAP therapy since 2021   Vitamin D  deficiency      Family History  Problem Relation Age of Onset   Hypertension Mother    Coronary artery disease Mother    Heart attack Mother        acute   Cancer Mother    Alcohol  abuse Father    Depression Father    Hypertension Father    Heart attack Father 44       acute   Alcohol  abuse Sister    Hyperlipidemia Sister    Hypertension Sister    Cancer Sister 61   Heart attack Sister        x's 2   Coronary artery disease Sister 39       x's 2   Breast cancer Sister 55     Past Surgical History:  Procedure Laterality Date   ABDOMINAL AORTIC ENDOVASCULAR STENT GRAFT  03/27/2013   Dr. Cordella Shawl   ABDOMINAL HYSTERECTOMY     Menometrorrhagia. Excessive bleeding. Unknown if cervix removed.    BILATERAL SALPINGOOPHORECTOMY  09/27/2023   CATARACT EXTRACTION W/PHACO Left 05/14/2023   Procedure: CATARACT EXTRACTION PHACO AND INTRAOCULAR LENS PLACEMENT (IOC) LEFT DIABETIC  OMIDRIA   19.40  01:34.8;  Surgeon: Enola Feliciano Hugger, MD;  Location: [facility];  Service: Ophthalmology;  Laterality: Left;   CATARACT EXTRACTION W/PHACO Right 06/27/2023   Procedure: CATARACT EXTRACTION PHACO AND INTRAOCULAR LENS PLACEMENT (IOC) RIGHT DIABETIC  6.47  00:42.6;  Surgeon: Enola Feliciano Hugger, MD;  Location: [facility];  Service: Ophthalmology;  Laterality: Right;   COLONOSCOPY WITH PROPOFOL  N/A 01/05/2021   Procedure: COLONOSCOPY WITH PROPOFOL ;  Surgeon: Janalyn Keene NOVAK, MD;  Location: [facility];  Service: Endoscopy;  Laterality: N/A;   CORONARY ANGIOPLASTY WITH STENT PLACEMENT  03/16/2009    Procedure: CORONARY ANGIOPLASTY WITH STENT PLACEMENT; Location: [facility]; Surgeons: Vinie Jude, MD (diagnostic) and Deatrice Cage, MD (interventional)   ENDARTERECTOMY Right 11/09/2023   Procedure: ENDARTERECTOMY CAROTID;  Surgeon: Jama Cordella MATSU, MD;  Location: [facility] OR[facility];  Service: Vascular;  Laterality: Right;   ESOPHAGOGASTRODUODENOSCOPY (EGD) WITH PROPOFOL  N/A 01/05/2021   Procedure: ESOPHAGOGASTRODUODENOSCOPY (EGD) WITH PROPOFOL ;  Surgeon: Janalyn Keene NOVAK, MD;  Location: [facility];  Service: Endoscopy;  Laterality: N/A;   EXTRACORPOREAL SHOCK WAVE LITHOTRIPSY Right 1999   INTERCOSTAL NERVE BLOCK Left 04/06/2021   Procedure: INTERCOSTAL NERVE BLOCK;  Surgeon: Kerrin Elspeth BROCKS, MD;  Location: [facility] OR;  Service: Thoracic;  Laterality: Left;   IR IMAGING GUIDED PORT INSERTION  06/20/2022   LUNG LOBECTOMY Left 04/06/2021   robotic left lower lobectomy 04/06/2021 Dr. Kerrin for Stage 1A adenocarcinoma   NODE DISSECTION Left 04/06/2021   Procedure: NODE DISSECTION;  Surgeon: Kerrin Elspeth BROCKS, MD;  Location: [facility] OR;  Service: Thoracic;  Laterality: Left;   TUBAL LIGATION      Social History   Socioeconomic History   Marital status: Divorced    Spouse name: Not on file   Number of children: 2   Years of education: H/S   Highest education level: 12th grade  Occupational History   Occupation: Disabled   Occupation: retired  Tobacco Use   Smoking status: Every Day    Current packs/day: 1.00    Average packs/day: 1 pack/day for 51.7 years (51.7 ttl pk-yrs)    Types: Cigarettes    Start date: 1974   Smokeless tobacco: Never   Tobacco comments:    Smokes 1 PPD - khj 09/02/2024  Vaping Use   Vaping status: Never Used  Substance and Sexual Activity   Alcohol  use: Not Currently   Drug use: No   Sexual activity: Not on file  Other Topics Concern   Not on file  Social History Narrative   Hx of smoking; quit prior to lung surgery then started back. Lives in [place]  alone. Used to work in [employer], [employer]- Facilities manager. On disability sec to spinal pain.    Social Drivers of Health   Financial Resource Strain: Medium Risk (02/20/2024)   Overall Financial Resource Strain (CARDIA)    Difficulty of Paying Living Expenses: Somewhat hard  Food Insecurity: No Food Insecurity (02/20/2024)   Hunger Vital Sign    Worried About Running Out of Food in the Last Year: Never true    Ran Out of Food in the Last Year: Never true  Transportation Needs: No Transportation Needs (02/20/2024)   PRAPARE - Administrator, Civil Service (Medical): No    Lack of Transportation (Non-Medical): No  Physical Activity: Inactive (02/20/2024)   Exercise Vital Sign    Days of Exercise per Week: 0 days    Minutes of Exercise per Session: 0 min  Stress: Stress Concern Present (02/20/2024)  [employer] of Occupational Health - Occupational Stress Questionnaire    Feeling of Stress : To some extent  Social Connections: Socially Isolated (02/20/2024)   Social Connection and Isolation Panel    Frequency of Communication with Friends and Family: More than three times a week    Frequency of Social Gatherings with Friends and Family: Once a week    Attends Religious Services: Never    Database administrator or Organizations: No    Attends Banker Meetings: Never    Marital Status: Divorced  Catering manager Violence: Not At Risk (02/20/2024)   Humiliation, Afraid, Rape, and Kick questionnaire    Fear of Current or Ex-Partner: No    Emotionally Abused: No    Physically Abused: No    Sexually Abused: No     Allergies  Allergen Reactions   Atorvastatin Other (See Comments)    Elevated blood sugar    Omeprazole Nausea And Vomiting   Bupropion  Nausea Only    Only on the 150mg  tablets, but the 100mg  didn't help with smoking     CBC    Component Value Date/Time   WBC 6.6 08/25/2024 0855   RBC 5.16 (H) 08/25/2024 0855   HGB 14.7 08/25/2024 0855   HGB 16.0  (H) 03/17/2024 1024   HCT 43.7 08/25/2024 0855   HCT 47.7 (H) 03/17/2024 1024   PLT 185 08/25/2024 0855   PLT 239 03/17/2024 1024   MCV 84.7 08/25/2024 0855   MCV 87 03/17/2024 1024   MCV 88 03/28/2013 0354   MCH 28.5 08/25/2024 0855   MCHC 33.6 08/25/2024 0855   RDW 13.6 08/25/2024 0855   RDW 14.0 03/17/2024 1024   RDW 13.9 03/28/2013 0354   LYMPHSABS 2.7 08/25/2024 0855   LYMPHSABS 4.3 (H) 02/16/2016 0941   LYMPHSABS 2.0 03/28/2013 0354   MONOABS 0.4 08/25/2024 0855   MONOABS 0.7 03/28/2013 0354   EOSABS 0.3 08/25/2024 0855   EOSABS 0.3 02/16/2016 0941   EOSABS 0.2 03/28/2013 0354   BASOSABS 0.1 08/25/2024 0855   BASOSABS 0.0 02/16/2016 0941   BASOSABS 0.0 03/28/2013 0354    Pulmonary Functions Testing Results:    Latest Ref Rng & Units 07/05/2023    9:39 AM  PFT Results  FVC-Pre L 2.83   FVC-Predicted Pre % 76   FVC-Post L 3.14   FVC-Predicted Post % 84   Pre FEV1/FVC % % 67   Post FEV1/FCV % % 65   FEV1-Pre L 1.89   FEV1-Predicted Pre % 66   FEV1-Post L 2.05   DLCO uncorrected ml/min/mmHg 11.96   DLCO UNC% % 52   DLVA Predicted % 62   TLC L 5.89   TLC % Predicted % 102   RV % Predicted % 118     Outpatient Medications Prior to Visit  Medication Sig Dispense Refill   acetaminophen  (TYLENOL ) 500 MG tablet Take 500 mg by mouth every 6 (six) hours as needed for moderate pain (pain score 4-6).     albuterol  (VENTOLIN  HFA) 108 (90 Base) MCG/ACT inhaler Inhale 2 puffs into the lungs every 6 (six) hours as needed for wheezing or shortness of breath. 8.5 g 2   allopurinol  (ZYLOPRIM ) 100 MG tablet Take 1 tablet (100 mg total) by mouth daily. 90 tablet 4   aspirin  81 MG tablet Take 81 mg by mouth daily.      bisoprolol  (ZEBETA ) 10 MG tablet Take 1 tablet (10 mg total) by mouth daily. 90 tablet 0  clopidogrel  (PLAVIX ) 75 MG tablet Take 1 tablet (75 mg total) by mouth daily. 90 tablet 1   ibuprofen (ADVIL) 200 MG tablet Take 200 mg by mouth 2 (two) times daily as needed  (headaches).     lactulose  (CHRONULAC ) 10 GM/15ML solution Take 30 mLs (20 g total) by mouth 2 (two) times daily as needed for mild constipation. 236 mL 0   lidocaine -prilocaine  (EMLA ) cream Apply on the port. 30 -45 min  prior to port access. 30 g 3   lisinopril -hydrochlorothiazide  (ZESTORETIC ) 20-12.5 MG tablet Take 1 tablet by mouth daily. 90 tablet 2   magnesium gluconate (MAGONATE) 500 MG tablet Take 500 mg by mouth at bedtime.     oxyCODONE  (OXY IR/ROXICODONE ) 5 MG immediate release tablet Take 1-2 tablets (5-10 mg total) by mouth every 4 (four) hours as needed for moderate pain (pain score 4-6). 30 tablet 0   pantoprazole  (PROTONIX ) 20 MG tablet Take 1 tablet (20 mg total) by mouth daily. 90 tablet 4   Polyethyl Glyc-Propyl Glyc PF (SYSTANE PRESERVATIVE FREE) 0.4-0.3 % SOLN Place 1 drop into both eyes daily as needed (dry eyes).     prochlorperazine  (COMPAZINE ) 10 MG tablet Take 1 tablet (10 mg total) by mouth every 6 (six) hours as needed for nausea or vomiting. 45 tablet 1   rosuvastatin  (CRESTOR ) 20 MG tablet Take 1 tablet (20 mg total) by mouth daily. 90 tablet 4   Semaglutide ,0.25 or 0.5MG /DOS, (OZEMPIC , 0.25 OR 0.5 MG/DOSE,) 2 MG/1.5ML SOPN Inject 0.5 mg into the skin once a week. 6 mL 1   Budeson-Glycopyrrol-Formoterol  (BREZTRI  AEROSPHERE) 160-9-4.8 MCG/ACT AERO Inhale 2 puffs into the lungs in the morning and at bedtime. 1284 g 11   Facility-Administered Medications Prior to Visit  Medication Dose Route Frequency Provider Last Rate Last Admin   heparin  lock flush 100 UNIT/ML injection

## 2024-09-16 ENCOUNTER — Ambulatory Visit: Admitting: Family Medicine

## 2024-09-17 ENCOUNTER — Encounter: Payer: Self-pay | Admitting: Family Medicine

## 2024-09-17 ENCOUNTER — Ambulatory Visit: Admitting: Family Medicine

## 2024-09-17 VITALS — BP 137/89 | HR 68 | Ht 68.0 in | Wt 187.6 lb

## 2024-09-17 DIAGNOSIS — E7800 Pure hypercholesterolemia, unspecified: Secondary | ICD-10-CM

## 2024-09-17 DIAGNOSIS — Z23 Encounter for immunization: Secondary | ICD-10-CM | POA: Diagnosis not present

## 2024-09-17 DIAGNOSIS — I2510 Atherosclerotic heart disease of native coronary artery without angina pectoris: Secondary | ICD-10-CM | POA: Diagnosis not present

## 2024-09-17 DIAGNOSIS — E119 Type 2 diabetes mellitus without complications: Secondary | ICD-10-CM

## 2024-09-17 DIAGNOSIS — Z7985 Long-term (current) use of injectable non-insulin antidiabetic drugs: Secondary | ICD-10-CM

## 2024-09-17 DIAGNOSIS — Z860101 Personal history of adenomatous and serrated colon polyps: Secondary | ICD-10-CM | POA: Diagnosis not present

## 2024-09-17 DIAGNOSIS — I10 Essential (primary) hypertension: Secondary | ICD-10-CM

## 2024-09-17 LAB — POCT GLYCOSYLATED HEMOGLOBIN (HGB A1C)
Est. average glucose Bld gHb Est-mCnc: 120
Hemoglobin A1C: 5.8 % — AB (ref 4.0–5.6)

## 2024-09-17 NOTE — Patient Instructions (Addendum)
.   Please review the attached list of medications and notify my office if there are any errors.   . Please contact your eyecare professional to schedule a routine eye exam  

## 2024-09-17 NOTE — Progress Notes (Signed)
 Established patient visit   Patient: Laura Nielsen [facility]   DOB: [birth year]   68 y.o. Female  MRN: [id number] Visit Date: 09/17/2024  Today's healthcare provider: Nancyann Perry, MD   Chief Complaint  Patient presents with   Medical Management of Chronic Issues    Follow-up HTN and T2DM  Diabetic Eye Exam: not done   Subjective    Discussed the use of AI scribe software for clinical note transcription with the patient, who gave verbal consent to proceed.  History of Present Illness   Laura Nielsen is a 68 year old female with diabetes who presents for a follow-up on her blood sugar management.  She is taking Ozempic , which was reduced to 0.5 mg due to gastrointestinal side effects, including weekly episodes of vomiting and diarrhea.  Her pulmonologist recently switched from Breztri  to Trelegy for her respiratory condition, as she was forgetting to take Breztri  at night. Since starting Trelegy, she has experienced burning in her sinuses and a sore throat. She follows the recommended practice of rinsing her mouth and brushing her teeth after use.  She is currently out of vitamin D  supplements, which she was taking at a dose of 2000 IU daily. She ran out about a week ago.  She has a history of stage four cancer and reports that she was told she is now at stage zero and is currently off treatment. She undergoes CT scans every three to four months for monitoring.     Lab Results  Component Value Date   HGBA1C 5.8 (A) 09/17/2024   HGBA1C 5.7 (H) 03/17/2024   HGBA1C 5.4 11/09/2023   Lab Results  Component Value Date   NA 140 08/25/2024   K 3.7 08/25/2024   CREATININE 0.65 08/25/2024   GFRNONAA >60 08/25/2024   GLUCOSE 98 08/25/2024   .lastlip  Medications: Outpatient Medications Prior to Visit  Medication Sig   acetaminophen  (TYLENOL ) 500 MG tablet Take 500 mg by mouth every 6 (six) hours as needed for moderate pain (pain score 4-6).   albuterol  (VENTOLIN  HFA)  108 (90 Base) MCG/ACT inhaler Inhale 2 puffs into the lungs every 6 (six) hours as needed for wheezing or shortness of breath.   allopurinol  (ZYLOPRIM ) 100 MG tablet Take 1 tablet (100 mg total) by mouth daily.   aspirin  81 MG tablet Take 81 mg by mouth daily.    bisoprolol  (ZEBETA ) 10 MG tablet Take 1 tablet (10 mg total) by mouth daily.   Cholecalciferol  (VITAMIN D3) 50 MCG (2000 UT) capsule Take 2,000 Units by mouth daily.   clopidogrel  (PLAVIX ) 75 MG tablet Take 1 tablet (75 mg total) by mouth daily.   Fluticasone -Umeclidin-Vilant (TRELEGY ELLIPTA) 100-62.5-25 MCG/ACT AEPB Inhale 1 puff into the lungs daily.   ibuprofen (ADVIL) 200 MG tablet Take 200 mg by mouth 2 (two) times daily as needed (headaches).   lactulose  (CHRONULAC ) 10 GM/15ML solution Take 30 mLs (20 g total) by mouth 2 (two) times daily as needed for mild constipation.   lidocaine -prilocaine  (EMLA ) cream Apply on the port. 30 -45 min  prior to port access.   lisinopril -hydrochlorothiazide  (ZESTORETIC ) 20-12.5 MG tablet Take 1 tablet by mouth daily.   magnesium gluconate (MAGONATE) 500 MG tablet Take 500 mg by mouth at bedtime.   nicotine (NICODERM CQ - DOSED IN MG/24 HOURS) 21 mg/24hr patch Place 1 patch (21 mg total) onto the skin daily.   oxyCODONE  (OXY IR/ROXICODONE ) 5 MG immediate release tablet Take 1-2 tablets (5-10 mg total)  by mouth every 4 (four) hours as needed for moderate pain (pain score 4-6).   pantoprazole  (PROTONIX ) 20 MG tablet Take 1 tablet (20 mg total) by mouth daily.   Polyethyl Glyc-Propyl Glyc PF (SYSTANE PRESERVATIVE FREE) 0.4-0.3 % SOLN Place 1 drop into both eyes daily as needed (dry eyes).   prochlorperazine  (COMPAZINE ) 10 MG tablet Take 1 tablet (10 mg total) by mouth every 6 (six) hours as needed for nausea or vomiting.   rosuvastatin  (CRESTOR ) 20 MG tablet Take 1 tablet (20 mg total) by mouth daily.   Semaglutide ,0.25 or 0.5MG /DOS, (OZEMPIC , 0.25 OR 0.5 MG/DOSE,) 2 MG/1.5ML SOPN Inject 0.5 mg into the  skin once a week.   Facility-Administered Medications Prior to Visit  Medication Dose Route Frequency Provider   heparin  lock flush 100 UNIT/ML injection             Objective    BP 137/89 (BP Location: Left Arm, Patient Position: Sitting, Cuff Size: Normal)   Pulse 68   Ht 5' 8 (1.727 m)   Wt 187 lb 9.6 oz (85.1 kg)   SpO2 99%   BMI 28.52 kg/m   Physical Exam   General appearance: Well developed, well nourished female, cooperative and in no acute distress Head: Normocephalic, without obvious abnormality, atraumatic Respiratory: Respirations even and unlabored, normal respiratory rate Extremities: All extremities are intact.  Skin: Skin color, texture, turgor normal. No rashes seen  Psych: Appropriate mood and affect. Neurologic: Mental status: Alert, oriented to person, place, and time, thought content appropriate.   Results for orders placed or performed in visit on 09/17/24  POCT glycosylated hemoglobin (Hb A1C)  Result Value Ref Range   Hemoglobin A1C 5.8 (A) 4.0 - 5.6 %   Est. average glucose Bld gHb Est-mCnc 120      Assessment & Plan        Type 2 diabetes mellitus Diabetes well-controlled with A1c of 5.8. Reduced Ozempic  to 0.5 mg due to gastrointestinal side effects, which have resolved. Blood sugar levels stable. - Continue Ozempic  at 0.5 mg. - Schedule follow-up in 6 months to re-evaluate blood sugar levels.  Chronic obstructive pulmonary disease (COPD) Switched to Trelegy for better adherence. Reports sinus burning and sore throat likely due to steroid component. - Recommend saline nasal spray to alleviate sinus irritation.  History of adenomatous colon polyp Follow-up colonoscopy overdue. She was not informed about the precancerous nature of the polyp. - Recommend scheduling a follow-up colonoscopy.  History of stage 4 cancer (currently in remission, under surveillance) In remission, under surveillance with CT scans every 3-4 months. Currently at  stage 0, off treatment. - Continue regular CT scans every 3-4 months as per oncology recommendations.  General Health Maintenance Considering flu shot, tolerated well in the past. Last flu shot was two years ago. Advised regular eye exams due to diabetes. - Administer flu shot today. - Encourage scheduling regular eye exams, ideally annually or biennially.    Primary hypertension BP near goal. Continue current medications.    Hypercholesteremia She is tolerating rosuvastatin  well with no adverse effects.    CAD in native artery Asymptomatic. Compliant with medication.  Continue aggressive risk factor modification.    History of adenomatous polyp of colon Poor prep on last colonoscopy with 1  <1cm adenomatous polyp. Will work on getting back into GI for colonoscopy in 2026  Immunization due  - Flu vaccine HIGH DOSE PF(Fluzone Trivalent)  Vitamin D  deficiency.  Continue - Cholecalciferol  (VITAMIN D3) 50 MCG (2000 UT) capsule;  Take 2,000 Units by mouth daily.     Return in about 6 months (around 03/18/2025) for Diabetes.     Nancyann Perry, MD  [facility] [phone] (phone) [phone] (fax)  [facility]

## 2024-10-02 ENCOUNTER — Telehealth: Payer: Self-pay | Admitting: Family Medicine

## 2024-10-02 ENCOUNTER — Other Ambulatory Visit: Payer: Self-pay | Admitting: Family Medicine

## 2024-10-02 DIAGNOSIS — E119 Type 2 diabetes mellitus without complications: Secondary | ICD-10-CM

## 2024-10-02 NOTE — Telephone Encounter (Signed)
Medication has been sent into pharmacy °

## 2024-10-02 NOTE — Telephone Encounter (Signed)
 [facility] faxed refill request for the following medications:  Semaglutide ,0.25 or 0.5MG /DOS, (OZEMPIC , 0.25 OR 0.5 MG/DOSE,) 2 MG/1.5ML SOPN    Please advise.

## 2024-10-20 ENCOUNTER — Other Ambulatory Visit: Payer: Self-pay | Admitting: Internal Medicine

## 2024-10-20 ENCOUNTER — Other Ambulatory Visit: Payer: Self-pay | Admitting: Family Medicine

## 2024-10-20 DIAGNOSIS — I10 Essential (primary) hypertension: Secondary | ICD-10-CM

## 2024-10-22 ENCOUNTER — Inpatient Hospital Stay: Attending: Internal Medicine

## 2024-10-22 DIAGNOSIS — F17210 Nicotine dependence, cigarettes, uncomplicated: Secondary | ICD-10-CM | POA: Insufficient documentation

## 2024-10-22 DIAGNOSIS — C3432 Malignant neoplasm of lower lobe, left bronchus or lung: Secondary | ICD-10-CM | POA: Diagnosis not present

## 2024-10-22 DIAGNOSIS — C7972 Secondary malignant neoplasm of left adrenal gland: Secondary | ICD-10-CM | POA: Insufficient documentation

## 2024-10-22 DIAGNOSIS — Z452 Encounter for adjustment and management of vascular access device: Secondary | ICD-10-CM | POA: Insufficient documentation

## 2024-11-28 ENCOUNTER — Other Ambulatory Visit: Payer: Self-pay | Admitting: Family Medicine

## 2024-11-28 DIAGNOSIS — I6523 Occlusion and stenosis of bilateral carotid arteries: Secondary | ICD-10-CM

## 2024-12-12 ENCOUNTER — Ambulatory Visit
Admission: RE | Admit: 2024-12-12 | Discharge: 2024-12-12 | Disposition: A | Source: Ambulatory Visit | Attending: Internal Medicine

## 2024-12-12 DIAGNOSIS — C3432 Malignant neoplasm of lower lobe, left bronchus or lung: Secondary | ICD-10-CM

## 2024-12-12 MED ORDER — SODIUM CHLORIDE 0.9% FLUSH
10.0000 mL | INTRAVENOUS | Status: DC | PRN
  Administered 2024-12-12: 10 mL via INTRAVENOUS

## 2024-12-12 MED ORDER — HEPARIN SOD (PORK) LOCK FLUSH 100 UNIT/ML IV SOLN
500.0000 [IU] | Freq: Once | INTRAVENOUS | Status: AC
  Administered 2024-12-12: 500 [IU] via INTRAVENOUS

## 2024-12-12 MED ORDER — IOPAMIDOL (ISOVUE-300) INJECTION 61%
100.0000 mL | Freq: Once | INTRAVENOUS | Status: AC | PRN
  Administered 2024-12-12: 100 mL via INTRAVENOUS

## 2024-12-23 ENCOUNTER — Encounter: Payer: Self-pay | Admitting: Internal Medicine

## 2024-12-25 ENCOUNTER — Inpatient Hospital Stay

## 2024-12-25 ENCOUNTER — Inpatient Hospital Stay: Attending: Internal Medicine | Admitting: Internal Medicine

## 2024-12-25 ENCOUNTER — Inpatient Hospital Stay: Attending: Internal Medicine

## 2024-12-25 ENCOUNTER — Ambulatory Visit

## 2024-12-25 ENCOUNTER — Encounter: Payer: Self-pay | Admitting: Internal Medicine

## 2024-12-25 VITALS — BP 132/74 | HR 73 | Temp 96.8°F | Resp 16 | Ht 68.0 in | Wt 182.4 lb

## 2024-12-25 DIAGNOSIS — M549 Dorsalgia, unspecified: Secondary | ICD-10-CM | POA: Diagnosis not present

## 2024-12-25 DIAGNOSIS — J449 Chronic obstructive pulmonary disease, unspecified: Secondary | ICD-10-CM | POA: Insufficient documentation

## 2024-12-25 DIAGNOSIS — Z803 Family history of malignant neoplasm of breast: Secondary | ICD-10-CM | POA: Diagnosis not present

## 2024-12-25 DIAGNOSIS — E559 Vitamin D deficiency, unspecified: Secondary | ICD-10-CM | POA: Diagnosis not present

## 2024-12-25 DIAGNOSIS — Z79899 Other long term (current) drug therapy: Secondary | ICD-10-CM | POA: Diagnosis not present

## 2024-12-25 DIAGNOSIS — C3432 Malignant neoplasm of lower lobe, left bronchus or lung: Secondary | ICD-10-CM | POA: Insufficient documentation

## 2024-12-25 DIAGNOSIS — E876 Hypokalemia: Secondary | ICD-10-CM | POA: Insufficient documentation

## 2024-12-25 DIAGNOSIS — C7972 Secondary malignant neoplasm of left adrenal gland: Secondary | ICD-10-CM | POA: Diagnosis present

## 2024-12-25 DIAGNOSIS — C3431 Malignant neoplasm of lower lobe, right bronchus or lung: Secondary | ICD-10-CM | POA: Diagnosis not present

## 2024-12-25 DIAGNOSIS — Z902 Acquired absence of lung [part of]: Secondary | ICD-10-CM | POA: Diagnosis not present

## 2024-12-25 DIAGNOSIS — G8929 Other chronic pain: Secondary | ICD-10-CM | POA: Insufficient documentation

## 2024-12-25 DIAGNOSIS — R0789 Other chest pain: Secondary | ICD-10-CM | POA: Diagnosis not present

## 2024-12-25 DIAGNOSIS — F17210 Nicotine dependence, cigarettes, uncomplicated: Secondary | ICD-10-CM | POA: Diagnosis not present

## 2024-12-25 LAB — CMP (CANCER CENTER ONLY)
ALT: 11 U/L (ref 0–44)
AST: 23 U/L (ref 15–41)
Albumin: 4.1 g/dL (ref 3.5–5.0)
Alkaline Phosphatase: 104 U/L (ref 38–126)
Anion gap: 12 (ref 5–15)
BUN: 19 mg/dL (ref 8–23)
CO2: 23 mmol/L (ref 22–32)
Calcium: 9.9 mg/dL (ref 8.9–10.3)
Chloride: 106 mmol/L (ref 98–111)
Creatinine: 0.62 mg/dL (ref 0.44–1.00)
GFR, Estimated: >60 mL/min
Glucose, Bld: 102 mg/dL — ABNORMAL HIGH (ref 70–99)
Potassium: 3.8 mmol/L (ref 3.5–5.1)
Sodium: 141 mmol/L (ref 135–145)
Total Bilirubin: 0.6 mg/dL (ref 0.0–1.2)
Total Protein: 7.1 g/dL (ref 6.5–8.1)

## 2024-12-25 LAB — CBC WITH DIFFERENTIAL (CANCER CENTER ONLY)
Abs Immature Granulocytes: 0.02 K/uL (ref 0.00–0.07)
Basophils Absolute: 0.1 K/uL (ref 0.0–0.1)
Basophils Relative: 1 %
Eosinophils Absolute: 0.3 K/uL (ref 0.0–0.5)
Eosinophils Relative: 4 %
HCT: 44.3 % (ref 36.0–46.0)
Hemoglobin: 14.9 g/dL (ref 12.0–15.0)
Immature Granulocytes: 0 %
Lymphocytes Relative: 39 %
Lymphs Abs: 3.1 K/uL (ref 0.7–4.0)
MCH: 28.8 pg (ref 26.0–34.0)
MCHC: 33.6 g/dL (ref 30.0–36.0)
MCV: 85.5 fL (ref 80.0–100.0)
Monocytes Absolute: 0.6 K/uL (ref 0.1–1.0)
Monocytes Relative: 8 %
Neutro Abs: 3.8 K/uL (ref 1.7–7.7)
Neutrophils Relative %: 48 %
Platelet Count: 209 K/uL (ref 150–400)
RBC: 5.18 MIL/uL — ABNORMAL HIGH (ref 3.87–5.11)
RDW: 13.7 % (ref 11.5–15.5)
WBC Count: 7.9 K/uL (ref 4.0–10.5)
nRBC: 0 % (ref 0.0–0.2)

## 2024-12-25 NOTE — Assessment & Plan Note (Addendum)
#  JUNE 2023-STAGE IV--recurrent/metastatic disease with New left adrenal metastasis;  PET scan JUNE 20th- 2023-  Enlarged 4 cm hypermetabolic LEFT adrenal metastasis;  Suspicion of metastatic adenopathy to the LEFT hilum.  Part solid nodule in the RIGHT lower lobe without metabolic activity. 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 Currently s/p  single agent Keytruda  [PD-L1 greater than 80%]x 2 years.  Finished September 2025.  Currently on surveillance.  # JAN 16th, 2026- 1. Status post left lower lobectomy.. No evidence of recurrent or metastatic disease in the chest, abdomen, or pelvis.  Occasional irregular ground-glass nodules in the right lower lobe unchanged, measuring up to 0.8 cm. Will repeat imaging in 6 months. Order at next visit.  # Mild Hypokalemia: discussed dietary supplement.Stable.    # Chronic pain: headaches/cervical pain/back pain [Dr.Naveira; pain doctor]  On NSAIDs [ monitor for now. OCT 2023- MRI brain- NEGATIVE for any metastatic disease.   Stable.  FEB 2024- MRI lumbar spine- degenerative disease- on ibuprofen prn- BID.Stable.    # Left rib pain-Post throacotomy pain/tingling and numbness: stable [ DISContinue gabapentin ]. stable    # CAD/ PVD- cramping- s/p evaluation [Dr.Schneir]; ? Carotid occlusion- s/p CEA surgery- CT JULY 2025  Extensive atherosclerotic calcification within the thoracic aorta with almost certain hemodynamically significant stenosis of the left subclavian artery to repeat origin-s/p evaluation with  Dr. Jama. Stable.    # COPD-stable encouraged continue to avoid smoking; again counseled to quit smoking; s/p pulmonary-  Stable.    # vit D def [April 2025-PCP - 29- recommend vitD Supp BID- OTC; will check in 6 weeks- if not improved then ergo weekly- Stable.   # ACP: [03/31/2024] -Full code.  # IV Access :s/p  port placement stable  *AM  appts- Monday-    # DISPOSITION:  #  HOLD keytruda  today; de-access # in 2 months port flush # follow up in 4 months- -MD labs/port- cbc/cmp; thyroid  panel--Dr. B  # I reviewed the blood work- with the patient in detail; also reviewed the imaging independently [as summarized above]; and with the patient in detail.

## 2024-12-25 NOTE — Progress Notes (Signed)
 [facility] CONSULT NOTE  Patient Care Team: [name], MD as PCP - General (Family Medicine) Schnier, Cordella MATSU, MD (Vascular Surgery) Janalyn Keene NOVAK, MD (Inactive) as Consulting Physician (Gastroenterology) Verdene Gills, RN as Oncology Nurse Navigator Rennie Cindy SAUNDERS, MD as Consulting Physician (Oncology) Isadora Hose, MD as Consulting Physician (Pulmonary Disease) Ammon Blunt, MD as Consulting Physician (Cardiology) Enola Feliciano Hugger, MD as Consulting Physician (Ophthalmology)   CHIEF COMPLAINTS/PURPOSE OF CONSULTATION: lung cancer  #  Oncology History Overview Note  #MAY 2022- LLL nodule 2.3 cm Adenocarcinoma Stage IA; s/p lobectomy.  No adjuvant therapy  # CT scan 4th June 2023-highly concerning for recurrent/metastatic disease with-. New left adrenal metastasis;  Ground-glass and part solid nodules in the peripheral right lower lobe, unchanged from 11/07/2021 but slightly enlarged from 01/01/2018. Findings are suspicious for indolent adenocarcinoma.  F One- PFL1 =80%; KRAS G12C PET scan JUNE 20th- 2023-  Enlarged 4 cm hypermetabolic LEFT adrenal metastasis;  Suspicion of metastatic adenopathy to the LEFT hilum.  Part solid nodule in the RIGHT lower lobe without metabolic activity.   3 ADRENAL BIOPSY- CK7 POSITIVE ADENOCARCINOMA- There is limited tissue remaining for ancillary testing, not likely  sufficient for NGS testing.   # AUG 7th, 2023-single agent Keytruda .    Right mildly complex cystic adnexal mass noted-June 13th, 2024- Significant interval enlargement of a multiseptated cystic lesion of the right ovary, now measuring 11.7 x 8.2 cm, previously 8.1 x 5.8 cm.- s/p in [facility] [October, 2024]- mucinous cystadenoma - stable.   Primary cancer of left lower lobe of lung (HCC)  05/09/2021 Initial Diagnosis   Primary cancer of left lower lobe of lung (HCC)   07/04/2022 -  Chemotherapy   Patient is on Treatment Plan : LUNG NSCLC  Pembrolizumab  (200) q21d     07/10/2022 - 07/10/2022 Chemotherapy   Patient is on Treatment Plan : LUNG NSCLC Pembrolizumab  (200) q21d      HISTORY OF PRESENTING ILLNESS: Patient  ambulating.  Alone.   Laura Nielsen 69 y.o.  female history of smoking; prior history of lung cancer-with likely recurrence to left adrenal gland [s/p Bx CK-7 positive TTF-1 negative]-clinically suggestive of recurrent lung cancer - currently NED on surveillance is here for follow-up/ and review CT scan.  Discussed the use of AI scribe software for clinical note transcription with the patient, who gave verbal consent to proceed.  History of Present Illness   Laura Nielsen is a 69 year old female with metastatic stage IV lung cancer, status post immunotherapy, who presents for follow-up and surveillance imaging review.  She completed her last cycle of pembrolizumab  in September 2025 and is currently undergoing surveillance. At this visit, she reviewed her most recent CT scan, which demonstrated a stable small right lower lobe pulmonary nodule without interval growth. She is asymptomatic, denying cough, dyspnea, chest pain, abnormal bleeding, bruising, or new palpable masses.  She did not report any new or worsening symptoms.  She discussed significant recent family stressors, specifically the loss of her granddaughter, who was stillborn at [redacted] weeks gestation on Thanksgiving Day 2025. She described ongoing emotional distress and grief, noting persistent sadness but expressing hope for improvement.      Review of Systems  Constitutional:  Negative for chills, diaphoresis, fever, malaise/fatigue and weight loss.  HENT:  Negative for nosebleeds and sore throat.   Eyes:  Negative for double vision.  Respiratory:  Negative for cough, hemoptysis, sputum production, shortness of breath and wheezing.   Cardiovascular:  Negative for palpitations, orthopnea and leg swelling.  Gastrointestinal:  Negative for  abdominal pain, blood in stool, constipation, diarrhea, heartburn, melena, nausea and vomiting.  Genitourinary:  Negative for dysuria, frequency and urgency.  Musculoskeletal:  Positive for back pain and joint pain.  Skin: Negative.  Negative for itching and rash.  Neurological:  Positive for tingling. Negative for dizziness, focal weakness, weakness and headaches.  Endo/Heme/Allergies:  Does not bruise/bleed easily.  Psychiatric/Behavioral:  Negative for depression. The patient is not nervous/anxious and does not have insomnia.      MEDICAL HISTORY:  Past Medical History:  Diagnosis Date   AAA (abdominal aortic aneurysm)    a.) s/p EVAR stent graft repair 03/27/2013   Adnexal mass    Aortic atherosclerosis    Arthritis    Asthma    Atherosclerosis of native artery of both lower extremities with intermittent claudication    B12 deficiency    Bilateral carotid artery stenosis    a.) doppler 05/06/2020 and 06/30/2021: 1-39% BICA; b.) doppler 11/20/2022: 60-79% RICA; c.) CTA neck 08/17/2023: >75% RICA, 30% LICA   CAD (coronary artery disease) 03/16/2009   a.) LHC/PCI 03/16/2009: 25% mLCx, 70% mRCA (3 x 18 mm Xience V DES); b.) MV 10/22/2017 and 03/16/2021: no ischemia; c.) MV 07/16/2023: mild apical ischemia   Cancer of left adrenal gland (HCC) 06/20/2022   a.) CNB 06/20/2022 -> pathology (+) for CK7 (TTF-1 -) adenocarcinoma   Centrilobular emphysema (HCC)    Cervical spinal stenosis    Cervical spondylosis with radiculopathy    Chronic back pain    Colon polyp    Complex ovarian cyst    Complication of anesthesia    a.) prone to experience postoperative hypotension   COPD (chronic obstructive pulmonary disease) (HCC)    DDD (degenerative disc disease), cervical    DDD (degenerative disc disease), thoracolumbar    Depression    Diabetes mellitus type 2, controlled (HCC)    Diastolic dysfunction 06/29/2023   a.) TTE 06/29/2023: EF >55%, no RWMAs, G1DD, triv PR, mild MR/TR   Fatty  liver    GERD (gastroesophageal reflux disease)    Gout    History of bilateral cataract extraction    History of kidney stones    Hypercholesteremia    Long term (current) use of aspirin     Long term current use of clopidogrel     Lumbar spinal stenosis    Lumbar spondylosis    Non-small cell lung cancer metastatic to adrenal gland (HCC)    a.) s/p LLL wedge resection 04/06/2021 --> pathology (+) for well differentiated adenocarcinoma (stage 1A) --> no adjuvant Tx; b.) recurrent stage IV adenocarcinoma 05/2022 --> Tx'd with  pembrolizumab    Osteoarthritis    Osteoporosis    Primary hypertension    Seasonal allergies    Sleep apnea    a.) no nocturnal PAP therapy since 2021   Vitamin D  deficiency     SURGICAL HISTORY: Past Surgical History:  Procedure Laterality Date   ABDOMINAL AORTIC ENDOVASCULAR STENT GRAFT  03/27/2013   Dr. Cordella Shawl   ABDOMINAL HYSTERECTOMY     Menometrorrhagia. Excessive bleeding. Unknown if cervix removed.    BILATERAL SALPINGOOPHORECTOMY  09/27/2023   CATARACT EXTRACTION W/PHACO Left 05/14/2023   Procedure: CATARACT EXTRACTION PHACO AND INTRAOCULAR LENS PLACEMENT (IOC) LEFT DIABETIC  OMIDRIA   19.40  01:34.8;  Surgeon: Enola Feliciano Hugger, MD;  Location: [facility];  Service: Ophthalmology;  Laterality: Left;   CATARACT EXTRACTION W/PHACO Right 06/27/2023   Procedure: CATARACT  EXTRACTION PHACO AND INTRAOCULAR LENS PLACEMENT (IOC) RIGHT DIABETIC  6.47  00:42.6;  Surgeon: Enola Feliciano Hugger, MD;  Location: [facility];  Service: Ophthalmology;  Laterality: Right;   COLONOSCOPY WITH PROPOFOL  N/A 01/05/2021   Procedure: COLONOSCOPY WITH PROPOFOL ;  Surgeon: Janalyn Keene NOVAK, MD;  Location: [facility] END[facility]COPY;  Service: Endoscopy;  Laterality: N/A;   CORONARY ANGIOPLASTY WITH STENT PLACEMENT  03/16/2009   Procedure: CORONARY ANGIOPLASTY WITH STENT PLACEMENT; Location: [facility]; Surgeons: Vinie Jude, MD (diagnostic) and Deatrice Cage,  MD (interventional)   ENDARTERECTOMY Right 11/09/2023   Procedure: ENDARTERECTOMY CAROTID;  Surgeon: Jama Cordella MATSU, MD;  Location: [facility] ORS;  Service: Vascular;  Laterality: Right;   ESOPHAGOGASTRODUODENOSCOPY (EGD) WITH PROPOFOL  N/A 01/05/2021   Procedure: ESOPHAGOGASTRODUODENOSCOPY (EGD) WITH PROPOFOL ;  Surgeon: Janalyn Keene NOVAK, MD;  Location: [facility] ENDOSCOPY;  Service: Endoscopy;  Laterality: N/A;   EXTRACORPOREAL SHOCK WAVE LITHOTRIPSY Right 1999   INTERCOSTAL NERVE BLOCK Left 04/06/2021   Procedure: INTERCOSTAL NERVE BLOCK;  Surgeon: Kerrin Elspeth BROCKS, MD;  Location: [facility] OR;  Service: Thoracic;  Laterality: Left;   IR IMAGING GUIDED PORT INSERTION  06/20/2022   LUNG LOBECTOMY Left 04/06/2021   robotic left lower lobectomy 04/06/2021 Dr. Kerrin for Stage 1A adenocarcinoma   NODE DISSECTION Left 04/06/2021   Procedure: NODE DISSECTION;  Surgeon: Kerrin Elspeth BROCKS, MD;  Location: [facility] OR;  Service: Thoracic;  Laterality: Left;   TUBAL LIGATION      SOCIAL HISTORY: Social History   Socioeconomic History   Marital status: Divorced    Spouse name: Not on file   Number of children: 2   Years of education: H/S   Highest education level: 12th grade  Occupational History   Occupation: Disabled   Occupation: retired  Tobacco Use   Smoking status: Every Day    Current packs/day: 1.00    Average packs/day: 1 pack/day for 52.1 years (52.1 ttl pk-yrs)    Types: Cigarettes    Start date: 1974   Smokeless tobacco: Never   Tobacco comments:    Smokes 1 PPD - khj 09/02/2024  Vaping Use   Vaping status: Never Used  Substance and Sexual Activity   Alcohol  use: Not Currently   Drug use: No   Sexual activity: Not on file  Other Topics Concern   Not on file  Social History Narrative   Hx of smoking; quit prior to lung surgery then started back. Lives in [place] alone. Used to work in [employer], [employer]- facilities manager. On disability sec to spinal pain.    Social Drivers of Health    Tobacco Use: High Risk (12/25/2024)   Patient History    Smoking Tobacco Use: Every Day    Smokeless Tobacco Use: Never    Passive Exposure: Not on file  Financial Resource Strain: Low Risk (09/13/2024)   Overall Financial Resource Strain (CARDIA)    Difficulty of Paying Living Expenses: Not very hard  Food Insecurity: No Food Insecurity (09/13/2024)   Epic    Worried About Programme Researcher, Broadcasting/film/video in the Last Year: Never true    Ran Out of Food in the Last Year: Never true  Transportation Needs: No Transportation Needs (09/13/2024)   Epic    Lack of Transportation (Medical): No    Lack of Transportation (Non-Medical): No  Physical Activity: Inactive (09/13/2024)   Exercise Vital Sign    Days of Exercise per Week: 0 days    Minutes of Exercise per Session: Not on file  Stress: Stress Concern Present (09/13/2024)  [employer] of Occupational Health - Occupational Stress Questionnaire    Feeling of Stress: Rather much  Social Connections: Socially Isolated (09/13/2024)   Social Connection and Isolation Panel    Frequency of Communication with Friends and Family: More than three times a week    Frequency of Social Gatherings with Friends and Family: Once a week    Attends Religious Services: Never    Database Administrator or Organizations: No    Attends Engineer, Structural: Not on file    Marital Status: Divorced  Intimate Partner Violence: Not At Risk (02/20/2024)   Humiliation, Afraid, Rape, and Kick questionnaire    Fear of Current or Ex-Partner: No    Emotionally Abused: No    Physically Abused: No    Sexually Abused: No  Depression (PHQ2-9): Low Risk (12/25/2024)   Depression (PHQ2-9)    PHQ-2 Score: 2  Alcohol  Screen: Low Risk (02/20/2024)   Alcohol  Screen    Last Alcohol  Screening Score (AUDIT): 0  Housing: Low Risk (09/13/2024)   Epic    Unable to Pay for Housing in the Last Year: No    Number of Times Moved in the Last Year: 1    Homeless in the Last  Year: No  Utilities: Not At Risk (02/20/2024)   AHC Utilities    Threatened with loss of utilities: No  Health Literacy: Adequate Health Literacy (02/20/2024)   B1300 Health Literacy    Frequency of need for help with medical instructions: Never    FAMILY HISTORY: Family History  Problem Relation Age of Onset   Hypertension Mother    Coronary artery disease Mother    Heart attack Mother        acute   Cancer Mother    Alcohol  abuse Father    Depression Father    Hypertension Father    Heart attack Father 67       acute   Alcohol  abuse Sister    Hyperlipidemia Sister    Hypertension Sister    Cancer Sister 77   Heart attack Sister        x's 2   Coronary artery disease Sister 29       x's 2   Breast cancer Sister 40    ALLERGIES:  is allergic to atorvastatin, omeprazole, and bupropion .  MEDICATIONS:  Current Outpatient Medications  Medication Sig Dispense Refill   acetaminophen  (TYLENOL ) 500 MG tablet Take 500 mg by mouth every 6 (six) hours as needed for moderate pain (pain score 4-6).     albuterol  (VENTOLIN  HFA) 108 (90 Base) MCG/ACT inhaler Inhale 2 puffs into the lungs every 6 (six) hours as needed for wheezing or shortness of breath. 8.5 g 2   allopurinol  (ZYLOPRIM ) 100 MG tablet Take 1 tablet (100 mg total) by mouth daily. 90 tablet 4   aspirin  81 MG tablet Take 81 mg by mouth daily.      bisoprolol  (ZEBETA ) 10 MG tablet Take 1 tablet (10 mg total) by mouth daily. 90 tablet 3   Cholecalciferol  (VITAMIN D3) 50 MCG (2000 UT) capsule Take 2,000 Units by mouth daily.     clopidogrel  (PLAVIX ) 75 MG tablet Take 1 tablet (75 mg total) by mouth daily. 90 tablet 0   Fluticasone -Umeclidin-Vilant (TRELEGY ELLIPTA ) 100-62.5-25 MCG/ACT AEPB Inhale 1 puff into the lungs daily. 30 each 12   ibuprofen (ADVIL) 200 MG tablet Take 200 mg by mouth 2 (two) times daily as needed (headaches).     lactulose  (CHRONULAC ) 10  GM/15ML solution Take 30 mLs (20 g total) by mouth 2 (two) times  daily as needed for mild constipation. 236 mL 0   lidocaine -prilocaine  (EMLA ) cream Apply on the port. 30 -45 min prior to port access. 30 g 3   lisinopril -hydrochlorothiazide  (ZESTORETIC ) 20-12.5 MG tablet Take 1 tablet by mouth daily. 90 tablet 2   magnesium gluconate (MAGONATE) 500 MG tablet Take 500 mg by mouth at bedtime.     oxyCODONE  (OXY IR/ROXICODONE ) 5 MG immediate release tablet Take 1-2 tablets (5-10 mg total) by mouth every 4 (four) hours as needed for moderate pain (pain score 4-6). 30 tablet 0   pantoprazole  (PROTONIX ) 20 MG tablet Take 1 tablet (20 mg total) by mouth daily. 90 tablet 4   Polyethyl Glyc-Propyl Glyc PF (SYSTANE PRESERVATIVE FREE) 0.4-0.3 % SOLN Place 1 drop into both eyes daily as needed (dry eyes).     prochlorperazine  (COMPAZINE ) 10 MG tablet Take 1 tablet (10 mg total) by mouth every 6 (six) hours as needed for nausea or vomiting. 45 tablet 1   rosuvastatin  (CRESTOR ) 20 MG tablet Take 1 tablet (20 mg total) by mouth daily. 90 tablet 4   Semaglutide ,0.25 or 0.5MG /DOS, (OZEMPIC , 0.25 OR 0.5 MG/DOSE,) 2 MG/3ML SOPN Inject 0.5 mg into the skin once a week. 6 mL 1   No current facility-administered medications for this visit.   Facility-Administered Medications Ordered in Other Visits  Medication Dose Route Frequency Provider Last Rate Last Admin   heparin  lock flush 100 UNIT/ML injection             Mild maculopapular rash on the Maller area left more than right.  PHYSICAL EXAMINATION: ECOG PERFORMANCE STATUS: 1 - Symptomatic but completely ambulatory  Vitals:   12/25/24 0851  BP: 132/74  Pulse: 73  Resp: 16  Temp: (!) 96.8 F (36 C)  SpO2: 99%        Filed Weights   12/25/24 0851  Weight: 182 lb 6.4 oz (82.7 kg)         Physical Exam HENT:     Head: Normocephalic and atraumatic.     Mouth/Throat:     Pharynx: No oropharyngeal exudate.  Eyes:     Pupils: Pupils are equal, round, and reactive to light.  Cardiovascular:     Rate and  Rhythm: Normal rate and regular rhythm.  Pulmonary:     Comments: Decreased breath sounds bilaterally.  No wheeze or crackles Abdominal:     General: Bowel sounds are normal. There is no distension.     Palpations: Abdomen is soft. There is no mass.     Tenderness: There is no abdominal tenderness. There is no guarding or rebound.  Musculoskeletal:        General: No tenderness. Normal range of motion.     Cervical back: Normal range of motion and neck supple.  Skin:    General: Skin is warm.  Neurological:     Mental Status: She is alert and oriented to person, place, and time.  Psychiatric:        Mood and Affect: Affect normal.      LABORATORY DATA:  I have reviewed the data as listed Lab Results  Component Value Date   WBC 7.9 12/25/2024   HGB 14.9 12/25/2024   HCT 44.3 12/25/2024   MCV 85.5 12/25/2024   PLT 209 12/25/2024   Recent Labs    08/05/24 0908 08/25/24 0855 12/25/24 0854  NA 137 140 141  K 3.8 3.7 3.8  CL 106 108 106  CO2 23 24 23   GLUCOSE 93 98 102*  BUN 15 19 19   CREATININE 0.58 0.65 0.62  CALCIUM  9.4 9.3 9.9  GFRNONAA >60 >60 >60  PROT 6.7 6.8 7.1  ALBUMIN 3.8 3.8 4.1  AST 20 20 23   ALT 12 13 11   ALKPHOS 88 90 104  BILITOT 0.7 0.9 0.6    RADIOGRAPHIC STUDIES: I have personally reviewed the radiological images as listed and agreed with the findings in the report. CT CHEST ABDOMEN PELVIS W CONTRAST Result Date: 12/19/2024 CLINICAL DATA:  Lung cancer restaging * Tracking Code: BO * EXAM: CT CHEST, ABDOMEN, AND PELVIS WITH CONTRAST TECHNIQUE: Multidetector CT imaging of the chest, abdomen and pelvis was performed following the standard protocol during bolus administration of intravenous contrast. RADIATION DOSE REDUCTION: This exam was performed according to the departmental dose-optimization program which includes automated exposure control, adjustment of the mA and/or kV according to patient size and/or use of iterative reconstruction technique.  CONTRAST:  ISOVUE -300 IOPAMIDOL  (ISOVUE -300) INJECTION 61% additional oral enteric contrast COMPARISON:  CT chest, 08/14/2024, CT chest abdomen pelvis, 11/05/2023 FINDINGS: CT CHEST FINDINGS Cardiovascular: Right chest port catheter. Aortic atherosclerosis. Normal heart size. Three-vessel coronary artery calcifications no pericardial effusion. Mediastinum/Nodes: No enlarged mediastinal, hilar, or axillary lymph nodes. Thyroid  gland, trachea, and esophagus demonstrate no significant findings. Lungs/Pleura: Status post left lower lobectomy. Mild centrilobular and paraseptal emphysema. Diffuse bilateral bronchial wall thickening and background of fine nodularity throughout the lungs. Occasional irregular ground-glass nodules in the right lower lobe unchanged, measuring up to 0.8 cm (series 3, image 97). No pleural effusion or pneumothorax. Musculoskeletal: No chest wall abnormality. No acute osseous findings. CT ABDOMEN PELVIS FINDINGS Hepatobiliary: No solid liver abnormality is seen. No gallstones, gallbladder wall thickening, or biliary dilatation. Pancreas: Unremarkable. No pancreatic ductal dilatation or surrounding inflammatory changes. Spleen: Normal in size without significant abnormality. Adrenals/Urinary Tract: Adrenal glands are unremarkable. Kidneys are normal, without renal calculi, solid lesion, or hydronephrosis. Bladder is unremarkable. Stomach/Bowel: Stomach is within normal limits. Appendix not clearly visualized. No evidence of bowel wall thickening, distention, or inflammatory changes. Sigmoid diverticulosis. Vascular/Lymphatic: Aortic atherosclerosis. Aortobiiliac stent endograft. No enlarged abdominal or pelvic lymph nodes. Reproductive: Hysterectomy. Other: No abdominal wall hernia or abnormality. No ascites. Musculoskeletal: No acute osseous findings. IMPRESSION: 1. Status post left lower lobectomy. 2. No evidence of recurrent or metastatic disease in the chest, abdomen, or pelvis. 3.  Emphysema and smoking-related respiratory bronchiolitis. 4. Occasional irregular ground-glass nodules in the right lower lobe unchanged, measuring up to 0.8 cm. Attention on follow-up. 5. Coronary artery disease. Aortic Atherosclerosis (ICD10-I70.0) and Emphysema (ICD10-J43.9). Electronically Signed   By: Marolyn JONETTA Jaksch M.D.   On: 12/19/2024 12:07        ASSESSMENT & PLAN:   Primary cancer of left lower lobe of lung (HCC) #JUNE 2023-STAGE IV--recurrent/metastatic disease with New left adrenal metastasis;  PET scan JUNE 20th- 2023-  Enlarged 4 cm hypermetabolic LEFT adrenal metastasis;  Suspicion of metastatic adenopathy to the LEFT hilum.  Part solid nodule in the RIGHT lower lobe without metabolic activity. 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 Currently s/p  single agent Keytruda  [PD-L1 greater than 80%]x 2 years.  Finished September 2025.  Currently on surveillance.  # JAN 16th, 2026- 1. Status post left lower lobectomy.. No evidence of recurrent or metastatic disease in the chest, abdomen, or pelvis.  Occasional irregular ground-glass nodules in the right lower lobe unchanged, measuring up to 0.8 cm. Will repeat imaging in 6 months. Order at next visit.  # Mild Hypokalemia: discussed dietary supplement.Stable.    # Chronic pain: headaches/cervical pain/back pain [Dr.Naveira; pain doctor]  On NSAIDs [ monitor for now. OCT 2023- MRI brain- NEGATIVE for any metastatic disease.   Stable.  FEB 2024- MRI lumbar spine- degenerative disease- on ibuprofen prn- BID.Stable.    # Left rib pain-Post throacotomy pain/tingling and numbness: stable [ DISContinue gabapentin ]. stable    # CAD/ PVD- cramping- s/p evaluation [Dr.Schneir]; ? Carotid occlusion- s/p CEA surgery- CT JULY 2025  Extensive atherosclerotic calcification within the thoracic aorta with almost certain hemodynamically  significant stenosis of the left subclavian artery to repeat origin-s/p evaluation with  Dr. Jama. Stable.    # COPD-stable encouraged continue to avoid smoking; again counseled to quit smoking; s/p pulmonary-  Stable.    # vit D def [April 2025-PCP - 28- recommend vitD Supp BID- OTC; will check in 6 weeks- if not improved then ergo weekly- Stable.   # ACP: [03/31/2024] -Full code.  # IV Access :s/p  port placement stable  *AM appts- Monday-    # DISPOSITION:  #  HOLD keytruda  today; de-access # in 2 months port flush # follow up in 4 months- -MD labs/port- cbc/cmp; thyroid  panel--Dr. B  # I reviewed the blood work- with the patient in detail; also reviewed the imaging independently [as summarized above]; and with the patient in detail.            Cindy JONELLE Joe, MD 12/25/2024 10:12 AM

## 2024-12-25 NOTE — Progress Notes (Signed)
 Pt said she thought she was done with treatment. Port is still accessed just in case.  She lost a grand baby at 32 weeks.  CT CAP 12/12/24.

## 2025-01-05 ENCOUNTER — Other Ambulatory Visit (HOSPITAL_COMMUNITY): Payer: Self-pay

## 2025-01-05 ENCOUNTER — Encounter: Payer: Self-pay | Admitting: Internal Medicine

## 2025-01-05 ENCOUNTER — Telehealth: Payer: Self-pay | Admitting: Pharmacy Technician

## 2025-01-05 NOTE — Telephone Encounter (Signed)
 Pharmacy Patient Advocate Encounter   Received notification from Onbase CMM KEY that prior authorization for Ozempic  (1 MG/DOSE) 4MG /3ML pen-injectors is due for renewal.   Insurance verification completed.   The patient is insured through [facility].  Action: PA required; PA started via CoverMyMeds. KEY [id number] . Waiting for clinical questions to populate.

## 2025-01-07 ENCOUNTER — Other Ambulatory Visit (HOSPITAL_COMMUNITY): Payer: Self-pay

## 2025-02-23 ENCOUNTER — Inpatient Hospital Stay

## 2025-02-25 ENCOUNTER — Ambulatory Visit

## 2025-03-18 ENCOUNTER — Ambulatory Visit: Admitting: Family Medicine

## 2025-04-24 ENCOUNTER — Inpatient Hospital Stay: Admitting: Internal Medicine

## 2025-04-24 ENCOUNTER — Inpatient Hospital Stay

## 2025-06-29 ENCOUNTER — Encounter (INDEPENDENT_AMBULATORY_CARE_PROVIDER_SITE_OTHER)

## 2025-06-29 ENCOUNTER — Ambulatory Visit (INDEPENDENT_AMBULATORY_CARE_PROVIDER_SITE_OTHER): Admitting: Vascular Surgery
